# Patient Record
Sex: Male | Born: 1940 | Race: Black or African American | Hispanic: No | Smoking: Former smoker
Health system: Southern US, Community
[De-identification: ages and names within clinical notes are randomized; demographics above are authoritative.]

## PROBLEM LIST (undated history)

## (undated) DIAGNOSIS — K635 Polyp of colon: Secondary | ICD-10-CM

## (undated) DIAGNOSIS — I493 Ventricular premature depolarization: Secondary | ICD-10-CM

## (undated) DIAGNOSIS — N183 Chronic kidney disease, stage 3 unspecified: Secondary | ICD-10-CM

## (undated) DIAGNOSIS — I428 Other cardiomyopathies: Secondary | ICD-10-CM

## (undated) DIAGNOSIS — Z9289 Personal history of other medical treatment: Secondary | ICD-10-CM

## (undated) DIAGNOSIS — I498 Other specified cardiac arrhythmias: Secondary | ICD-10-CM

## (undated) DIAGNOSIS — H548 Legal blindness, as defined in USA: Secondary | ICD-10-CM

## (undated) DIAGNOSIS — I499 Cardiac arrhythmia, unspecified: Secondary | ICD-10-CM

## (undated) DIAGNOSIS — I2510 Atherosclerotic heart disease of native coronary artery without angina pectoris: Secondary | ICD-10-CM

## (undated) DIAGNOSIS — J189 Pneumonia, unspecified organism: Secondary | ICD-10-CM

## (undated) DIAGNOSIS — Z923 Personal history of irradiation: Secondary | ICD-10-CM

## (undated) DIAGNOSIS — I739 Peripheral vascular disease, unspecified: Secondary | ICD-10-CM

## (undated) DIAGNOSIS — M4310 Spondylolisthesis, site unspecified: Secondary | ICD-10-CM

## (undated) DIAGNOSIS — Z87442 Personal history of urinary calculi: Secondary | ICD-10-CM

## (undated) DIAGNOSIS — I10 Essential (primary) hypertension: Secondary | ICD-10-CM

## (undated) DIAGNOSIS — Z95810 Presence of automatic (implantable) cardiac defibrillator: Secondary | ICD-10-CM

## (undated) DIAGNOSIS — I472 Ventricular tachycardia: Principal | ICD-10-CM

## (undated) DIAGNOSIS — I219 Acute myocardial infarction, unspecified: Secondary | ICD-10-CM

## (undated) DIAGNOSIS — I441 Atrioventricular block, second degree: Secondary | ICD-10-CM

## (undated) DIAGNOSIS — I5022 Chronic systolic (congestive) heart failure: Secondary | ICD-10-CM

## (undated) HISTORY — DX: Ventricular tachycardia: I47.2

## (undated) HISTORY — DX: Spondylolisthesis, site unspecified: M43.10

## (undated) HISTORY — DX: Essential (primary) hypertension: I10

## (undated) HISTORY — DX: Polyp of colon: K63.5

## (undated) HISTORY — PX: EYE SURGERY: SHX253

## (undated) HISTORY — DX: Peripheral vascular disease, unspecified: I73.9

## (undated) HISTORY — PX: CYSTOSCOPY: SUR368

## (undated) HISTORY — PX: BACK SURGERY: SHX140

## (undated) HISTORY — DX: Personal history of other medical treatment: Z92.89

---

## 1977-11-22 HISTORY — PX: POSTERIOR FUSION LUMBAR SPINE: SUR632

## 1989-07-23 HISTORY — PX: CATARACT EXTRACTION, BILATERAL: SHX1313

## 1999-05-01 ENCOUNTER — Encounter: Payer: Self-pay | Admitting: Emergency Medicine

## 1999-05-01 ENCOUNTER — Emergency Department (HOSPITAL_COMMUNITY): Admission: EM | Admit: 1999-05-01 | Discharge: 1999-05-01 | Payer: Self-pay | Admitting: Emergency Medicine

## 1999-07-24 HISTORY — PX: ROTATOR CUFF REPAIR: SHX139

## 1999-12-18 ENCOUNTER — Encounter: Payer: Self-pay | Admitting: Family Medicine

## 1999-12-18 ENCOUNTER — Encounter: Admission: RE | Admit: 1999-12-18 | Discharge: 1999-12-18 | Payer: Self-pay | Admitting: Family Medicine

## 1999-12-29 ENCOUNTER — Encounter: Admission: RE | Admit: 1999-12-29 | Discharge: 1999-12-29 | Payer: Self-pay | Admitting: Family Medicine

## 1999-12-29 ENCOUNTER — Encounter: Payer: Self-pay | Admitting: Family Medicine

## 2000-01-31 ENCOUNTER — Emergency Department (HOSPITAL_COMMUNITY): Admission: EM | Admit: 2000-01-31 | Discharge: 2000-01-31 | Payer: Self-pay | Admitting: Emergency Medicine

## 2000-04-04 ENCOUNTER — Encounter: Payer: Self-pay | Admitting: Family Medicine

## 2000-04-04 ENCOUNTER — Encounter: Admission: RE | Admit: 2000-04-04 | Discharge: 2000-04-04 | Payer: Self-pay | Admitting: Family Medicine

## 2000-09-22 ENCOUNTER — Ambulatory Visit (HOSPITAL_COMMUNITY): Admission: RE | Admit: 2000-09-22 | Discharge: 2000-09-22 | Payer: Self-pay | Admitting: Urology

## 2001-01-07 ENCOUNTER — Emergency Department (HOSPITAL_COMMUNITY): Admission: EM | Admit: 2001-01-07 | Discharge: 2001-01-07 | Payer: Self-pay | Admitting: Emergency Medicine

## 2001-01-07 ENCOUNTER — Emergency Department (HOSPITAL_COMMUNITY): Admission: EM | Admit: 2001-01-07 | Discharge: 2001-01-07 | Payer: Self-pay

## 2001-04-05 ENCOUNTER — Encounter: Admission: RE | Admit: 2001-04-05 | Discharge: 2001-04-05 | Payer: Self-pay | Admitting: Family Medicine

## 2001-04-05 ENCOUNTER — Encounter: Payer: Self-pay | Admitting: Family Medicine

## 2001-05-27 ENCOUNTER — Emergency Department (HOSPITAL_COMMUNITY): Admission: EM | Admit: 2001-05-27 | Discharge: 2001-05-27 | Payer: Self-pay | Admitting: Emergency Medicine

## 2001-05-27 ENCOUNTER — Encounter: Payer: Self-pay | Admitting: Emergency Medicine

## 2001-06-07 ENCOUNTER — Emergency Department (HOSPITAL_COMMUNITY): Admission: EM | Admit: 2001-06-07 | Discharge: 2001-06-07 | Payer: Self-pay | Admitting: Emergency Medicine

## 2001-06-30 ENCOUNTER — Encounter: Admission: RE | Admit: 2001-06-30 | Discharge: 2001-07-28 | Payer: Self-pay | Admitting: Family Medicine

## 2002-12-12 ENCOUNTER — Encounter: Payer: Self-pay | Admitting: Family Medicine

## 2002-12-12 ENCOUNTER — Encounter: Admission: RE | Admit: 2002-12-12 | Discharge: 2002-12-12 | Payer: Self-pay | Admitting: Family Medicine

## 2003-12-12 ENCOUNTER — Encounter: Admission: RE | Admit: 2003-12-12 | Discharge: 2003-12-12 | Payer: Self-pay | Admitting: Family Medicine

## 2003-12-16 ENCOUNTER — Emergency Department (HOSPITAL_COMMUNITY): Admission: AD | Admit: 2003-12-16 | Discharge: 2003-12-16 | Payer: Self-pay | Admitting: Family Medicine

## 2004-12-20 ENCOUNTER — Emergency Department (HOSPITAL_COMMUNITY): Admission: EM | Admit: 2004-12-20 | Discharge: 2004-12-20 | Payer: Self-pay | Admitting: Emergency Medicine

## 2005-06-02 ENCOUNTER — Ambulatory Visit: Payer: Self-pay | Admitting: Gastroenterology

## 2005-06-17 ENCOUNTER — Encounter (INDEPENDENT_AMBULATORY_CARE_PROVIDER_SITE_OTHER): Payer: Self-pay | Admitting: Specialist

## 2005-06-17 ENCOUNTER — Ambulatory Visit: Payer: Self-pay | Admitting: Gastroenterology

## 2005-09-30 ENCOUNTER — Encounter: Admission: RE | Admit: 2005-09-30 | Discharge: 2005-09-30 | Payer: Self-pay | Admitting: Family Medicine

## 2005-12-20 ENCOUNTER — Encounter: Admission: RE | Admit: 2005-12-20 | Discharge: 2005-12-20 | Payer: Self-pay | Admitting: Neurological Surgery

## 2005-12-31 ENCOUNTER — Inpatient Hospital Stay (HOSPITAL_COMMUNITY): Admission: RE | Admit: 2005-12-31 | Discharge: 2006-01-05 | Payer: Self-pay | Admitting: Neurological Surgery

## 2006-02-01 ENCOUNTER — Encounter: Admission: RE | Admit: 2006-02-01 | Discharge: 2006-02-01 | Payer: Self-pay | Admitting: Neurological Surgery

## 2006-04-26 ENCOUNTER — Encounter: Admission: RE | Admit: 2006-04-26 | Discharge: 2006-04-26 | Payer: Self-pay | Admitting: Neurological Surgery

## 2006-07-27 ENCOUNTER — Encounter: Admission: RE | Admit: 2006-07-27 | Discharge: 2006-07-27 | Payer: Self-pay | Admitting: Neurological Surgery

## 2006-12-05 ENCOUNTER — Encounter: Admission: RE | Admit: 2006-12-05 | Discharge: 2006-12-05 | Payer: Self-pay | Admitting: Neurological Surgery

## 2007-04-21 ENCOUNTER — Ambulatory Visit (HOSPITAL_COMMUNITY): Admission: RE | Admit: 2007-04-21 | Discharge: 2007-04-21 | Payer: Self-pay | Admitting: Ophthalmology

## 2007-06-02 ENCOUNTER — Ambulatory Visit (HOSPITAL_COMMUNITY): Admission: RE | Admit: 2007-06-02 | Discharge: 2007-06-02 | Payer: Self-pay | Admitting: Family Medicine

## 2007-12-01 ENCOUNTER — Ambulatory Visit (HOSPITAL_COMMUNITY): Admission: RE | Admit: 2007-12-01 | Discharge: 2007-12-01 | Payer: Self-pay | Admitting: Family Medicine

## 2008-07-22 ENCOUNTER — Encounter: Admission: RE | Admit: 2008-07-22 | Discharge: 2008-07-22 | Payer: Self-pay | Admitting: Neurological Surgery

## 2008-07-24 ENCOUNTER — Encounter: Admission: RE | Admit: 2008-07-24 | Discharge: 2008-07-24 | Payer: Self-pay | Admitting: Neurological Surgery

## 2009-01-31 ENCOUNTER — Encounter: Admission: RE | Admit: 2009-01-31 | Discharge: 2009-01-31 | Payer: Self-pay | Admitting: Family Medicine

## 2009-02-18 ENCOUNTER — Encounter: Admission: RE | Admit: 2009-02-18 | Discharge: 2009-02-18 | Payer: Self-pay | Admitting: Family Medicine

## 2009-10-31 ENCOUNTER — Encounter: Admission: RE | Admit: 2009-10-31 | Discharge: 2009-10-31 | Payer: Self-pay | Admitting: Family Medicine

## 2009-11-13 ENCOUNTER — Emergency Department (HOSPITAL_COMMUNITY): Admission: EM | Admit: 2009-11-13 | Discharge: 2009-11-13 | Payer: Self-pay | Admitting: Emergency Medicine

## 2010-06-24 ENCOUNTER — Encounter (INDEPENDENT_AMBULATORY_CARE_PROVIDER_SITE_OTHER): Payer: Self-pay | Admitting: *Deleted

## 2010-06-26 ENCOUNTER — Encounter (INDEPENDENT_AMBULATORY_CARE_PROVIDER_SITE_OTHER): Payer: Self-pay | Admitting: *Deleted

## 2010-08-04 ENCOUNTER — Ambulatory Visit: Payer: Self-pay | Admitting: Gastroenterology

## 2010-08-04 ENCOUNTER — Encounter (INDEPENDENT_AMBULATORY_CARE_PROVIDER_SITE_OTHER): Payer: Self-pay | Admitting: *Deleted

## 2010-08-19 ENCOUNTER — Ambulatory Visit: Payer: Self-pay | Admitting: Gastroenterology

## 2010-08-25 ENCOUNTER — Encounter: Payer: Self-pay | Admitting: Gastroenterology

## 2010-11-22 ENCOUNTER — Emergency Department (HOSPITAL_COMMUNITY)
Admission: EM | Admit: 2010-11-22 | Discharge: 2010-11-22 | Payer: Self-pay | Source: Home / Self Care | Admitting: Emergency Medicine

## 2010-12-02 ENCOUNTER — Encounter
Admission: RE | Admit: 2010-12-02 | Discharge: 2010-12-02 | Payer: Self-pay | Source: Home / Self Care | Attending: Neurological Surgery | Admitting: Neurological Surgery

## 2010-12-22 NOTE — Letter (Signed)
Summary: Colonoscopy Letter  [facility]  [address] [place], [place] [address]   Phone: [phone]  Fax: [phone]      June 24, 2010 MRN: [id number]   [facility] [address] [place], [place]  [address]   Dear Mr. PAULDING,   According to your medical record, it is time for you to schedule a Colonoscopy. The American Cancer Society recommends this procedure as a method to detect early colon cancer. Patients with a family history of colon cancer, or a personal history of colon polyps or inflammatory bowel disease are at increased risk.  This letter has beeen generated based on the recommendations made at the time of your procedure. If you feel that in your particular situation this may no longer apply, please contact our office.  Please call our office at [phone] to schedule this appointment or to update your records at your earliest convenience.  Thank you for cooperating with [place] to provide you with the very best care possible.   Sincerely,   Barbette Hair. [facility] [phone]

## 2010-12-22 NOTE — Miscellaneous (Signed)
Summary: [facility] Previsit/prep  Clinical Lists Changes  Medications: Added new medication of DULCOLAX 5 MG  TBEC (BISACODYL) Day before procedure take 2 at 3pm and 2 at 8pm. - Signed Added new medication of METOCLOPRAMIDE HCL 10 MG  TABS (METOCLOPRAMIDE HCL) As per prep instructions. - Signed Added new medication of MIRALAX   POWD (POLYETHYLENE GLYCOL 3350) As per prep  instructions. - Signed Rx of DULCOLAX 5 MG  TBEC (BISACODYL) Day before procedure take 2 at 3pm and 2 at 8pm.;  #4 x 0;  Signed;  Entered by: Wyona Almas RN;  Authorized by: Louis Meckel MD;  Method used: [facility] [address] [phone]*, [address], [place], [place], [place]  [id number], Ph: [phone] or [phone], Fax: [phone] Rx of METOCLOPRAMIDE HCL 10 MG  TABS (METOCLOPRAMIDE HCL) As per prep instructions.;  #2 x 0;  Signed;  Entered by: Wyona Almas RN;  Authorized by: Louis Meckel MD;  Method used: Electronically to [facility] [address] [id number]*, [address] [place], [place], [place]  [id number], Ph: [phone] or [phone], Fax: [phone] Rx of MIRALAX   POWD (POLYETHYLENE GLYCOL 3350) As per prep  instructions.;  #255gm x 0;  Signed;  Entered by: Wyona Almas RN;  Authorized by: Louis Meckel MD;  Method used: Electronically to [facility] [address] [phone]*, [address] [place], [place], [place]  [address], Ph: [phone] or [phone], Fax: [phone] Observations: Added new observation of NKA: T (08/04/2010 9:45)    Prescriptions: MIRALAX   POWD (POLYETHYLENE GLYCOL 3350) As per prep  instructions.  #255gm x 0   Entered by:   Wyona Almas RN   Authorized by:   Louis Meckel MD   Signed by:   Wyona Almas RN on 08/04/2010   Method used:   [facility]ronically to        [facility]  [address] [phone]* (retail)       [address]       [place], [place]  [address]       Ph: [phone] or  [phone]       Fax: [phone]   RxID:   [id number] METOCLOPRAMIDE HCL 10 MG  TABS (METOCLOPRAMIDE HCL) As per prep instructions.  #2 x 0   Entered by:   Wyona Almas RN   Authorized by:   Louis Meckel MD   Signed by:   Wyona Almas RN on 08/04/2010   Method used:   [facility]ronically to        [facility]  [address] [phone]* (retail)       [address]       [place], [place]  [address]       Ph: [phone] or [phone]       Fax: [phone]   RxID:   [id number] DULCOLAX 5 MG  TBEC (BISACODYL) Day before procedure take 2 at 3pm and 2 at 8pm.  #4 x 0   Entered by:   Wyona Almas RN   Authorized by:   Louis Meckel MD   Signed by:   Wyona Almas RN on 08/04/2010   Method used:   Electronically to        [facility]  [address] [phone]* (retail)       [address]       [place]  [place], [place]  [address]       Ph: [phone] or [phone]       Fax: [phone]   RxID:   [id number]

## 2010-12-22 NOTE — Letter (Signed)
Summary: Patient Notice-Hyperplastic Polyps  [facility]  [address] [place], [place] [address]   Phone: [phone]  Fax: [phone]        August 25, 2010 MRN: [id number]    [facility] [address] [place], [place]  [address]    Dear [name],  I am pleased to inform you that the colon polyp(s) removed during your recent colonoscopy was (were) found to be hyperplastic.  These types of polyps are NOT pre-cancerous.  It is therefore my recommendation that you have a repeat colonoscopy examination in _7 years for routine colorectal cancer screening.  Should you develop new or worsening symptoms of abdominal pain, bowel habit changes or bleeding from the rectum or bowels, please schedule an evaluation with either your primary care physician or with me.  Additional information/recommendations:  __No further action with gastroenterology is needed at this time.      Please follow-up with your primary care physician for your other      healthcare needs. __Please call [phone] to schedule a return visit to review      your situation.  __Please keep your follow-up visit as already scheduled.  _x_Continue treatment plan as outlined the day of your exam.  Please call us if you are having persistent problems or have questions about your condition that have not been fully answered at this time.  Sincerely,  Louis Meckel MD This letter has been electronically signed by your physician.  Appended Document: Patient Notice-Hyperplastic Polyps letter mailed

## 2010-12-22 NOTE — Letter (Signed)
Summary: [facility] Instructions  [facility]  [address] [place], [place] [address]   Phone: [phone]  Fax: [phone]       MARQUES ERICSON    23-Mar-1941    MRN: [id number]       Procedure Day Dorna Bloom:  [facility]  08/19/10     Arrival Time: 9:30AM     Procedure Time:  10:30AM     Location of Procedure:                    Juliann Pares  [facility] (4th Floor)  PREPARATION FOR COLONOSCOPY WITH MIRALAX  Starting 5 days prior to your procedure 08/14/10 do not eat nuts, seeds, popcorn, corn, beans, peas,  salads, or any raw vegetables.  Do not take any fiber supplements (e.g. Metamucil, Citrucel, and Benefiber). ____________________________________________________________________________________________________   THE DAY BEFORE YOUR PROCEDURE         DATE: 08/18/10  DAY: TUESDAY  1   Drink clear liquids the entire day-NO SOLID FOOD  2   Do not drink anything colored red or purple.  Avoid juices with pulp.  No orange juice.  3   Drink at least 64 oz. (8 glasses) of fluid/clear liquids during the day to prevent dehydration and help the prep work efficiently.  CLEAR LIQUIDS INCLUDE: Water Jello Ice Popsicles Tea (sugar ok, no milk/cream) Powdered fruit flavored drinks Coffee (sugar ok, no milk/cream) Gatorade Juice: apple, white grape, white cranberry  Lemonade Clear bullion, consomm, broth Carbonated beverages (any kind) Strained chicken noodle soup Hard Candy  4   Mix the entire bottle of Miralax with 64 oz. of Gatorade/Powerade in the morning and put in the refrigerator to chill.  5   At 3:00 pm take 2 Dulcolax/Bisacodyl tablets.  6   At 4:30 pm take one Reglan/Metoclopramide tablet.  7  Starting at 5:00 pm drink one 8 oz glass of the Miralax mixture every 15-20 minutes until you have finished drinking the entire 64 oz.  You should finish drinking prep around 7:30 or 8:00 pm.  8   If you are nauseated, you may take the 2nd Reglan/Metoclopramide  tablet at 6:30 pm.        9    At 8:00 pm take 2 more DULCOLAX/Bisacodyl tablets.     THE DAY OF YOUR PROCEDURE      DATE:  08/19/10  DAY: Lulu Riding  You may drink clear liquids until 8:30AM  (2 HOURS BEFORE PROCEDURE).   MEDICATION INSTRUCTIONS  Unless otherwise instructed, you should take regular prescription medications with a small sip of water as early as possible the morning of your procedure.    Additional medication instructions: Hold Furosemide the morning of procedure.  Take your blood pressure medicine the morning of procedure.         OTHER INSTRUCTIONS  You will need a responsible adult at least 72 years of age to accompany you and drive you home.   This person must remain in the waiting room during your procedure.  Wear loose fitting clothing that is easily removed.  Leave jewelry and other valuables at home.  However, you may wish to bring a book to read or an iPod/MP3 player to listen to music as you wait for your procedure to start.  Remove all body piercing jewelry and leave at home.  Total time from sign-in until discharge is approximately 2-3 hours.  You should go home directly after your procedure and rest.  You can resume  normal activities the day after your procedure.  The day of your procedure you should not:   Drive   Make legal decisions   Operate machinery   Drink alcohol   Return to work  You will receive specific instructions about eating, activities and medications before you leave.   The above instructions have been reviewed and explained to me by   Wyona Almas RN  August 04, 2010 10:16 AM     I fully understand and can verbalize these instructions _____________________________ Date _______

## 2010-12-22 NOTE — Letter (Signed)
Summary: Previsit letter  [facility]  [address] [place], [place] [address]   Phone: [phone]  Fax: [phone]       06/26/2010 MRN: [id number]  [facility] [address] [place], [place]  [address]  Dear [name],  Welcome to the Gastroenterology Division at [facility].    You are scheduled to see a nurse for your pre-procedure visit on 08/04/2010 at 10:00AM on the 3rd floor at [facility], [address]  We ask that you try to arrive at our office 15 minutes prior to your appointment time to allow for check-in.  Your nurse visit will consist of discussing your medical and surgical history, your immediate family medical history, and your medications.    Please bring a complete list of all your medications or, if you prefer, bring the medication bottles and we will list them.  We will need to be aware of both prescribed and over the counter drugs.  We will need to know exact dosage information as well.  If you are on blood thinners (Coumadin, Plavix, Aggrenox, Ticlid, etc.) please call our office today/prior to your appointment, as we need to consult with your physician about holding your medication.   Please be prepared to read and sign documents such as consent forms, a financial agreement, and acknowledgement forms.  If necessary, and with your consent, a friend or relative is welcome to sit-in on the nurse visit with you.  Please bring your insurance card so that we may make a copy of it.  If your insurance requires a referral to see a specialist, please bring your referral form from your primary care physician.  No co-pay is required for this nurse visit.     If you cannot keep your appointment, please call [phone] to cancel or reschedule prior to your appointment date.  This allows [place] the opportunity to schedule an appointment for another patient in need of care.    Thank you for choosing  Gastroenterology for your medical  needs.  We appreciate the opportunity to care for you.  Please visit [place] at our website  to learn more about our practice.                     Sincerely.                                                                                                                   The Gastroenterology Division

## 2010-12-22 NOTE — Procedures (Signed)
Summary: Colonoscopy  Patient: [name] Note: All result statuses are Final unless otherwise noted.  Tests: (1) Colonoscopy (COL)   COL Colonoscopy           DONE     [facility]     [address]     [place], [place]  [address]           COLONOSCOPY PROCEDURE REPORT           PATIENT:  [name], [name]  MR#:  [id number]     BIRTHDATE:  01-01-1941, 69 yrs. old  GENDER:  male           ENDOSCOPIST:  Vandy Tsuchiya. Arlyce Dice, MD     Referred by:           PROCEDURE DATE:  08/19/2010     PROCEDURE:  Colonoscopy with snare polypectomy     ASA CLASS:  Class II     INDICATIONS:  1) screening  2) history of pre-cancerous     (adenomatous) colon polyps           MEDICATIONS:   Fentanyl 50 mcg IV, Versed 5 mg IV           DESCRIPTION OF PROCEDURE:   After the risks benefits and     alternatives of the procedure were thoroughly explained, informed     consent was obtained.  Digital rectal exam was performed and     revealed no abnormalities.   The LB CF-[id number] endoscope     was introduced through the anus and advanced to the cecum, which     was identified by the ileocecal valve, without limitations.  The     quality of the prep was excellent, using MiraLax.  The instrument     was then slowly withdrawn as the colon was fully examined.     <<PROCEDUREIMAGES>>           FINDINGS:  A diminutive polyp was found in the ascending colon. It     was 2 mm in size. Polyp was snared without cautery. Retrieval was     successful (see image3). snare polyp  There were multiple polyps     identified and removed. in the recto-sigmoid colon. Multiple 1-80mm     sessile hyperplastic appearing polyps (normal overlying mucosa) in     rectum and sigmoid to 20cm (see image15 and image12). 2 polyps     were removed with cold polypectomy snare and submitted to     pathology  This was otherwise a normal examination of the colon     (see image1, image2, image6, image9, image11, and image16).     Retroflexed views in the rectum revealed no abnormalities.    The     time to cecum =  5.75  minutes. The scope was then withdrawn (time     =  12.50  min) from the patient and the procedure completed.           COMPLICATIONS:  None           ENDOSCOPIC IMPRESSION:     1) 2 mm diminutive polyp in the ascending colon     2) Polyps, multiple in the recto-sigmoid colon     3) Otherwise normal examination     RECOMMENDATIONS:     1) Await biopsy results; if polyps in rectosigmoid are     adenomatous, will need F. sig and polypectomy  REPEAT EXAM:   You will receive a letter from Dr. Arlyce Dice in 1-2     weeks, after reviewing the final pathology, with followup     recommendations.           ______________________________     Barbette Hair Arlyce Dice, MD           CC: Clyda Greener, MD           n.     Rosalie Doctor:   Barbette Hair. Kaplan at 08/19/2010 11:27 AM           Luciana Axe, [id number]  Note: An exclamation mark (!) indicates a result that was not dispersed into the flowsheet. Document Creation Date: 08/19/2010 11:27 AM _______________________________________________________________________  (1) Order result status: Final Collection or observation date-time: 08/19/2010 11:19 Requested date-time:  Receipt date-time:  Reported date-time:  Referring Physician:   Ordering Physician: Melvia Heaps [phone]) Specimen Source:  Source: Launa Grill Order Number: [id number] Lab site:   Appended Document: Colonoscopy     Procedures Next Due Date:    Colonoscopy: 07/2017

## 2011-01-11 ENCOUNTER — Encounter (HOSPITAL_COMMUNITY)
Admission: RE | Admit: 2011-01-11 | Discharge: 2011-01-11 | Disposition: A | Discharge status: Home / Self Care | Payer: Medicare Other | Source: Ambulatory Visit | Attending: Neurological Surgery | Admitting: Neurological Surgery

## 2011-01-11 ENCOUNTER — Other Ambulatory Visit (HOSPITAL_COMMUNITY): Payer: Self-pay | Admitting: Neurological Surgery

## 2011-01-11 ENCOUNTER — Ambulatory Visit (HOSPITAL_COMMUNITY)
Admission: RE | Admit: 2011-01-11 | Discharge: 2011-01-11 | Disposition: A | Discharge status: Home / Self Care | Payer: Medicare Other | Source: Ambulatory Visit | Attending: Neurological Surgery | Admitting: Neurological Surgery

## 2011-01-11 DIAGNOSIS — M48061 Spinal stenosis, lumbar region without neurogenic claudication: Secondary | ICD-10-CM | POA: Insufficient documentation

## 2011-01-11 DIAGNOSIS — M4807 Spinal stenosis, lumbosacral region: Secondary | ICD-10-CM

## 2011-01-11 DIAGNOSIS — Z01818 Encounter for other preprocedural examination: Secondary | ICD-10-CM | POA: Insufficient documentation

## 2011-01-11 LAB — BASIC METABOLIC PANEL
BUN: 11 mg/dL (ref 6–23)
CO2: 30 mEq/L (ref 19–32)
Calcium: 9.6 mg/dL (ref 8.4–10.5)
Chloride: 104 mEq/L (ref 96–112)
Creatinine, Ser: 1.37 mg/dL (ref 0.4–1.5)
GFR calc Af Amer: >60 mL/min (ref >60)
GFR calc non Af Amer: 51 mL/min — ABNORMAL LOW (ref >60)
Glucose, Bld: 90 mg/dL (ref 70–99)
Potassium: 3.6 mEq/L (ref 3.5–5.1)
Sodium: 142 mEq/L (ref 135–145)

## 2011-01-11 LAB — PROTIME-INR
INR: 1.17 (ref 0.00–1.49)
Prothrombin Time: 15.1 seconds (ref 11.6–15.2)

## 2011-01-11 LAB — TYPE AND SCREEN
ABO/RH(D): B POS
Antibody Screen: NEGATIVE

## 2011-01-11 LAB — DIFFERENTIAL
Basophils Absolute: 0 10*3/uL (ref 0.0–0.1)
Basophils Relative: 1 % (ref 0–1)
Eosinophils Absolute: 0.1 10*3/uL (ref 0.0–0.7)
Eosinophils Relative: 3 % (ref 0–5)
Lymphocytes Relative: 50 % — ABNORMAL HIGH (ref 12–46)
Lymphs Abs: 2.6 10*3/uL (ref 0.7–4.0)
Monocytes Absolute: 0.4 10*3/uL (ref 0.1–1.0)
Monocytes Relative: 7 % (ref 3–12)
Neutro Abs: 2.1 10*3/uL (ref 1.7–7.7)
Neutrophils Relative %: 40 % — ABNORMAL LOW (ref 43–77)

## 2011-01-11 LAB — CBC
HCT: 42.7 % (ref 39.0–52.0)
Hemoglobin: 15.1 g/dL (ref 13.0–17.0)
MCH: 30.4 pg (ref 26.0–34.0)
MCHC: 35.4 g/dL (ref 30.0–36.0)
MCV: 85.9 fL (ref 78.0–100.0)
Platelets: 161 10*3/uL (ref 150–400)
RBC: 4.97 MIL/uL (ref 4.22–5.81)
RDW: 12.5 % (ref 11.5–15.5)
WBC: 5.3 10*3/uL (ref 4.0–10.5)

## 2011-01-11 LAB — SURGICAL PCR SCREEN
MRSA, PCR: NEGATIVE
Staphylococcus aureus: NEGATIVE

## 2011-01-15 ENCOUNTER — Inpatient Hospital Stay (HOSPITAL_COMMUNITY): Payer: Medicare Other

## 2011-01-15 ENCOUNTER — Inpatient Hospital Stay (HOSPITAL_COMMUNITY)
Admission: RE | Admit: 2011-01-15 | Discharge: 2011-01-17 | DRG: 460 | Disposition: A | Discharge status: Home / Self Care | Payer: Medicare Other | Source: Ambulatory Visit | Attending: Neurological Surgery | Admitting: Neurological Surgery

## 2011-01-15 DIAGNOSIS — Z981 Arthrodesis status: Secondary | ICD-10-CM

## 2011-01-15 DIAGNOSIS — M48061 Spinal stenosis, lumbar region without neurogenic claudication: Principal | ICD-10-CM | POA: Diagnosis present

## 2011-01-15 DIAGNOSIS — I10 Essential (primary) hypertension: Secondary | ICD-10-CM | POA: Diagnosis present

## 2011-01-15 DIAGNOSIS — Z472 Encounter for removal of internal fixation device: Secondary | ICD-10-CM

## 2011-01-15 DIAGNOSIS — N35919 Unspecified urethral stricture, male, unspecified site: Secondary | ICD-10-CM | POA: Diagnosis present

## 2011-01-15 DIAGNOSIS — Z87891 Personal history of nicotine dependence: Secondary | ICD-10-CM

## 2011-01-16 NOTE — Op Note (Signed)
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  PROCEDURES: 1. Lumbar reexploration with removal of hardware at L5-S1 at a level     separate than our PLIF procedure. 2. Decompressive lumbar laminectomy, hemi-facetectomy, and     foraminotomy at L3-4 for decompression of the L3 and the L4 nerve     roots requiring much more work than it is typical of the simple     PLIF procedure. 3. Intertransverse arthrodesis at L3-4 utilizing local autograft and     Vitoss soaked with bone marrow aspirate. 4. Nonsegmental fixation at L3-4 utilizing the radius pedicle screw     system. 5. Posterior lumbar interbody fusion at L3-4 utilizing 12-mm PEEK     interbody cages packed with local autograft and Vitoss soaked with     bone marrow aspirate. 6. Bone marrow aspiration.  SURGEON:  Tia Alert, MD  ASSISTANT:  Donalee Citrin, MD  ANESTHESIA:  General endotracheal.  COMPLICATIONS:  None apparent.  INDICATIONS FOR PROCEDURE:  Mr. Kimmey is a 70 year old gentleman who underwent a previous L4-S1 fusion.  He presented with severe back and bilateral leg pain, had an MRI which showed adjacent level stenosis at L3-4.  He had plain films which suggested a solid fusion at L4-5 and L5- S1.  I recommended a L3-4 posterior lumbar interbody fusion with removal of hardware at L5-S1.  He understood the risks, benefits, and expected outcome and wished  to proceed.  DESCRIPTION OF PROCEDURE:  The patient was taken to the operating room. After induction of adequate generalized endotracheal anesthesia, he was rolled in prone position on chest rolls and all pressure points were padded.  His lumbar region was prepped with DuraPrep and draped in the usual sterile fashion.  10 mL of local anesthesia was injected and a dorsal midline incision was made and carried down to the lumbosacral fascia.  The fascia was opened and the paraspinous musculature was taken down in a subperiosteal fashion to expose L2-3, L3-4, and then the hardware from L4-S1.  Intraoperative x-ray confirmed my level and then I removed the spinous process of L3 and then performed a complete laminectomy and hemi-facetectomy at L3-4.  I was able to dissect the scar tissue away from the underlying dura.  The bone had grown back across the canal below the L4 pedicle level.  I was able to gently remove this.  There was no evidence of unintended durotomy.  I marched out to the pedicle wall, identified the L4 nerve roots, identified the L3 nerve roots.  I performed a  generous foraminotomy after my hemi- facetectomy.  All the yellow ligament was removed until the dura was nice and full and I could pass a coronary dilator easily along the L3 and the L4 nerve roots.  We then retracted the dura medially and coagulated the epidural venous vasculature and cut it sharply and cut the disk space.  We distracted the disk space at the 12 mm and then used rotating cutters and curettes to prepare the endplates.  We used 12-mm PEEK interbody cages on both sides.  These were packed with local autograft and Vitoss.  We then packed the midline with Vitoss and local autograft.  We then found our pedicle screw entry zones at L3 utilizing surface landmarks and lateral fluoroscopy.  We probed each pedicle with the pedicle probe, tapped each pedicle with a 5.5 tap.  I used the ball probe to palpate each  pedicle to assure no break in the cortex and then placed 6.75 x 45 mm pedicle screws into the L3 pedicles bilaterally.  We then decorticated the transverse processes and placed a mixture of local autograft and Vitoss out over these after obtaining a bone marrow aspirate from the pedicles escorting this on our Vitoss.  We then placed lordotic rods into multiaxial screw heads, placed our locking caps, and locked these with anti-torque device while achieving compression of our grafts.  We then checked our final construct with AP and lateral fluoroscopy.  We irrigated it with saline solution containing bacitracin, dried all the bleeding points, lined the dura with Gelfoam, placed a medium Hemovac drain, and then closed the muscle and fascia with 0 Vicryl, closed the subcutaneous and subcuticular tissue with 2-0 and 3-0 Vicryl, and closed the skin with benzoin and Steri-Strips.  The drapes were removed.  Sterile dressing was applied.  The patient was awakened from general anesthesia and transferred to the recovery room in stable condition.  At the end of the procedure, all sponge, needle, and instrument counts were correct.     Tia Alert, MD  DSJ/MEDQ  D:  01/15/2011  T:  01/16/2011  Job:  [id number]  Electronically Signed by Marikay Alar MD on 01/16/2011 08:11:41 AM

## 2011-01-18 NOTE — Op Note (Signed)
  NAME:  [name], [name]              ACCOUNT NO.:  [id number]  MEDICAL RECORD NO.:  [id number]           PATIENT TYPE:  I  LOCATION:  3172                         FACILITY:  [facility]  PHYSICIAN:  Lylla Eifler I. Patsi Sears, M.D.DATE OF BIRTH:  [birth year]  DATE OF PROCEDURE: DATE OF DISCHARGE:                              OPERATIVE REPORT   PREOPERATIVE DIAGNOSIS:  Urethral stricture disease.  POSTOPERATIVE DIAGNOSIS:  Urethral stricture disease.  OPERATIONS:  Flexible cystoscopy, dilation of penoscrotal junction stricture (26-French), and placement of 16-French Council catheter.  SURGEON:  Zerek Litsey I. Patsi Sears, M.D.  ANESTHESIA:  General endotracheal.  INDICATION FOR PROCEDURE:  With the patient in supine position, urology was called because of inability to pass Foley catheter on multiple occasions.  The patient has a past history of difficult Foley catheterization from neurosurgery several years ago.  PROCEDURE IN DETAIL:  Flexible bedside cystoscopy was accomplished, and penoscrotal junction stricture identified.  Guidewire was passed through the strictured area into the bladder and then the [facility] dilators were used to dilate the stricture to size 26.  This was removed, and a 16- Council catheter was then passed over the guidewire into the bladder. 10 mL of saline re-placed into the bladder, and clear urine was obtained.  The patient tolerated the procedure well.  The patient will keep the Foley catheter until he is able to stand to void.     Verdean Murin I. Patsi Sears, M.D.     SIT/MEDQ  D:  01/15/2011  T:  01/15/2011  Job:  [id number]  cc:   Tia Alert, MD Fax: [phone]  Electronically Signed by Jethro Bolus M.D. on 01/18/2011 09:35:10 AM

## 2011-01-29 NOTE — Discharge Summary (Signed)
  [name], [name]              ACCOUNT NO.:  [id number]  MEDICAL RECORD NO.:  [id number]           PATIENT TYPE:  I  LOCATION:  3006                         FACILITY:  [facility]  PHYSICIAN:  Tia Alert, MD     DATE OF BIRTH:  12/22/1940  DATE OF ADMISSION:  01/15/2011 DATE OF DISCHARGE:  01/17/2011                              DISCHARGE SUMMARY   ADMITTING DIAGNOSIS:  Adjacent level stenosis, L3-4.  PROCEDURE:  Posterior lumbar interbody fusion, L3-4.  HISTORY OF PRESENT ILLNESS:  [name] is a 70 year old gentleman who underwent a previous L4-S1 fusion.  He presents with severe back pain with bilateral leg pain and had an MRI which showed adjacent level stenosis at L3-4.  He had plain films that suggested a solid fusion at L4-5 and L5-S1.  I recommended posterior lumbar interbody fusion to L3-4 with removal of hardware at L5-S1.  He understood the risks, benefits, and expected outcome and wished to proceed.  HOSPITAL COURSE:  The patient was admitted on January 15, 2011, and taken to the operating room where he underwent a posterior lumbar interbody fusion at L3-4.  The patient tolerated the procedure well, was taken to the recovery room and then to the floor in stable condition. For details of the operative procedure, please see the dictated operative note.  The patient's hospital course was routine.  There were no complications.  He mobilized slowly with physical and occupational therapy.  He had good strength in his lower extremities.  He had improvement in his lower extremity pain.  He had appropriate back soreness.  His incision remained clean, dry, and intact.  He was urinating without difficulty after his Foley catheter was removed.  His pain became well controlled on oral pain medications.  He was discharged home in stable condition on January 17, 2011, with plans to follow up in 2 weeks.  FINAL DIAGNOSIS:  Posterior lumbar interbody fusion,  L3-4.     Tia Alert, MD     DSJ/MEDQ  D:  01/17/2011  T:  01/17/2011  Job:  [id number]  Electronically Signed by Marikay Alar MD on 01/29/2011 08:17:21 AM

## 2011-02-23 ENCOUNTER — Other Ambulatory Visit: Payer: Self-pay | Admitting: Neurological Surgery

## 2011-02-23 ENCOUNTER — Ambulatory Visit
Admission: RE | Admit: 2011-02-23 | Discharge: 2011-02-23 | Disposition: A | Discharge status: Home / Self Care | Payer: Medicare Other | Source: Ambulatory Visit | Attending: Neurological Surgery | Admitting: Neurological Surgery

## 2011-02-23 DIAGNOSIS — M48061 Spinal stenosis, lumbar region without neurogenic claudication: Secondary | ICD-10-CM

## 2011-02-23 DIAGNOSIS — M961 Postlaminectomy syndrome, not elsewhere classified: Secondary | ICD-10-CM

## 2011-04-09 NOTE — Op Note (Signed)
[name], [name] NO.:  [id number]   MEDICAL RECORD NO.:  [id number]          PATIENT TYPE:  INP   LOCATION:  2899                         FACILITY:  [facility]   PHYSICIAN:  Tia Alert, MD     DATE OF BIRTH:  [birth year]   DATE OF PROCEDURE:  12/31/2005  DATE OF DISCHARGE:                                 OPERATIVE REPORT   PREOPERATIVE DIAGNOSIS:  1.  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  2.  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   POSTOPERATIVE DIAGNOSIS:  1.  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  2.  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   PROCEDURE:  1.  Decompressive lumbar laminectomy, medial facetectomy and foraminotomy at      L4-5 and S1 for central canal and nerve root decompression; requiring      more work than is usually required for the Simple-plus procedure.  2.  Posterior lumbar interbody fusion -- L4-5 and  L5-S1; utilizing 9 mm      Peak interbody cages packed with local autograft and ActiFuse L4-5; and      8 mm Peak interbody cages packed with local autograft and ActiFuse at L5-      S1.  3.  Intertransverse arthrodesis, L4-S1 bilaterally -- utilizing ActiFuse and      local autograft.  4.  Segmental fixation L4-S1 bilaterally, utilizing the 90-D pedicle screw      system.   SURGEON:  Tia Alert, MD   ASSISTANT:  Kathaleen Maser. Pool, M.D..   ANESTHESIA:  General endotracheal.   COMPLICATIONS:  None apparent.   INDICATIONS FOR PROCEDURE:  [name] is a very pleasant 70 year old black  male, who was referred for back and bilateral leg pain (right greater than  left). He had an MRI and a CT scan, which showed severe degenerative disk  disease at L5-S1. He had a grade 1 spondylolisthesis at L4-5. He had spinal  stenosis at each level. He had tried medical management for quite some time  without significant relief. I recommended a decompressive lumbar laminectomy  with  segmental fusion and fixation at L4-S1. He understood the risks,  benefits and expected outcome and wished to proceed.   DESCRIPTION OF PROCEDURE:  The patient was taken to the operating room.  After induction of adequate generalized endotracheal anesthesia, he was  rolled into the prone position on the chest rolls. All pressure points were  padded. His lumbar region was prepped with DuraPrep and then draped in the  usual sterile fashion. Local anesthesia of 10 cc was injected, and then a  dorsal midline incision was made and carried down to the lumbosacral fascia.  The fascia was opened and the paraspinous musculature was taken down in a  subperiosteal fashion, to expose the L4-S1 bilaterally. Once intraoperative  fluoroscopy confirmed my level, I took the dissection out over the facets to  expose the transverse processes at L4-S1.  I then used the [place] rongeur  to remove the spinous processes and used the [employer] and the  [place] punch  to perform complete laminectomy, hemifacetectomies and foraminotomies of L4-  5 and  L5-S1. He had quite overgrown yellow ligament, which was identified  and removed.  The L4, L5 and S1 nerve roots were identified and decompressed  distally into their respective foramina.   I carried the dissection along each nerve root to assure adequate  decompression of each of the nerve roots.  I then coagulated the epidural  and venous vasculature.  I started at the L4-5 level. We incised the disk  space and performed the initial diskectomy with pituitary rongeurs.  We then  used sequential distraction to distract the disk space up to 9 mm. We then  used the rotating cutters, chisels and curets to prepare the endplates for  arthrodesis. We performed a complete diskectomy bilaterally using these  instruments.  We then, on the patient's left side, used a 9 x 25 mm Peak  interbody cage packed with local autograft and ActiFuse; tapped this into  position. We then  packed the midline with local autograft and ActiFuse.  We  then placed another interbody graft on the patient's left side that was  packed with autograft and ActiFuse.  Once the PLIFF was done at L4-5, we  used the same procedure to do at PLIFF at L5-S1. We incised the disk space  and performed initial diskectomy with pituitary rongeurs. We then completed  diskectomy after sequentially distracting the disk space up to 8 mm.  We  completed our diskectomy with curets and round scrapers.  We then used  8 mm  Peak interbody cages at L5-S1, and packed the midline with local autograft  and ActiFuse.   Once the PLIFF was complete, we turned our attention to the segmental  fixation.  We localized the pedicle screw entry zones at L4, L5 and S1  bilaterally. We probed each pedicle with a pedicle probe under fluoroscopic  guidance. We then tapped each pedicle with a 5.25 tap.  We then palpated  each pedicle to assure no break in the cortex, and placed 675 x 45 mm  pedicle screws in the pedicles of L4 and L5, and then 675 x 35 mm pedicle  screws in the S1 pedicles.  This was done under fluoroscopic guidance.   We then decorticated transverse processes of L4-L5 and S1; and then laid a  mixture of autograft and ActiFuse out over these to perform intertransverse  arthrodesis. We then placed two lordotic rods into the multiaxial screw  heads, and locked these into position with the locking caps and the anti-  torque device.  I then irrigated with copious amounts of bacitracin  containing saline solution.  I inspected the nerve roots once again with a  coronary dilator to assure adequate decompression of the nerve roots.  I  lined the dura with Gelfoam; placed a medium Hemovac drain through a  separate stab incision, and then closed the muscle and the fascia with  interrupted 0 Vicryl.  I closed the subcutaneous and subcuticular tissues with 2-0 and 3-0 Vicryl.  Closed the skin with Benzoin and  Steri-Strips. The  drapes were removed. The sterile dressing was applied. The patient was  awakened from general anesthesia and transported to the recovery room in  stable condition. At the endo of the procedure all sponge, needle and  instrument counts were correct.      Tia Alert, MD  Electronically Signed     DSJ/MEDQ  D:  12/31/2005  T:  01/01/2006  Job:  [id number]

## 2011-04-09 NOTE — Op Note (Signed)
[place]  Patient:    [name], [name]                     MRN: [id number] Proc. Date: 09/22/00 Adm. Date:  [id number] Attending:  Monica Becton                           Operative Report  PREOPERATIVE DIAGNOSIS:  Anterior urethral stricture.  POSTOPERATIVE DIAGNOSIS:  Anterior urethral stricture.  OPERATION:  Direct vision urethrotomy and cystoscopy.  SURGEON:  Claudette Laws, M.D.  DESCRIPTION OF PROCEDURE:  The patient was prepped and draped in the dorsal lithotomy under LMA anesthesia.  Urethroscopy was performed showing a concentric short stricture in the proximal anterior urethra.  Initially, I intubated the urethra with the 3 French whistle tip catheter and then using the direct vision urethrotome and the 0 degree lens a direct vision urethrotomy was performed with the half moon urethrotome.  The incision was carried through the cavernous tissue about 1 cm proximal and distal to the stricture with the 12 oclock position, and this seemed to open up the area nicely.  I then cystoscoped him with the 22 French cystoscope.  The rest of the urethra looked normal.  He had a short nonobstructing appearing prostate, some elevation of the posterior lip, normal view.  The bladder itself showed +1 trabeculation but no tumors, no calculi, normal urethral orifices, no suspicious lesions.  A #20 French 5 cc Foley catheter was then hooked to straight drain and the leg bag, and he was taken back to the recovery room in satisfactory condition. DD:  09/22/00 TD:  09/22/00 Job: [id number]

## 2011-04-09 NOTE — Consult Note (Signed)
[place]  Patient:    [name], [name]                     MRN: [id number] Proc. Date: 01/31/00 Adm. Date:  [id number] Attending:  Sandi Raveling Dictator:   [phone]                          Consultation Report  ER CONSULTATION  CHIEF COMPLAINT:  "Cant urinate."  HISTORY OF PRESENT ILLNESS:  This 70 year old man who is on disability comes in in acute urinary retention.  He saw his physician, Dr. Bruna Potter, for a physical a few days ago and he, apparently, was okay, no significant medical problems. However, over the past 24 hours, he developed progressive dribbling of urination, straining to urinate and got quite uncomfortable.  He was then seen here in the [facility].  Attempts were made to catheterize him and they were unsuccessful. I was called into see him.  PHYSICAL EXAMINATION:  GENERAL: Examination revealed a moderately acute man complaining of distended bladder.  GENITALIA:  Uncircumcised male.  Normal meatus. Testicles were normal.  RECTAL EXAM:  Deferred for now.  A flexible cystoscopy was initially performed after injecting the urethra with 20 cc of Urojet.  He was found to have about a mid urethral stricture, concentric.  I then dilated him up with filiform and followers to a #20 [place] and then submitted a cathed urine for C&S.  I removed the filiform and followers and was  able to get in an 89 [place] Foley catheter and hooked it to straight drain and o a leg bag.  He was then given gentamicin 160 mg IM to cover him for sepsis.  A prescription for Sulfatrimethoprim DS one b.i.d. #20 was written pending the urine culture.  He will call me if he has any chills or develops symptoms of sepsis. I gave him my name and phone number, I would like to see him back in the office in two to three days for follow up and probably remove the Foley catheter for a trial of voiding. DD:  01/31/00 TD:  01/31/00 Job:  [id number]

## 2011-04-09 NOTE — Discharge Summary (Signed)
NAMEDAISHAWN, LAUF NO.:  [id number]   MEDICAL RECORD NO.:  [id number]          PATIENT TYPE:  INP   LOCATION:  3014                         FACILITY:  [facility]   PHYSICIAN:  Tia Alert, MD     DATE OF BIRTH:  [birth year]   DATE OF ADMISSION:  12/31/2005  DATE OF DISCHARGE:  01/05/2006                                 DISCHARGE SUMMARY   ADMITTING DIAGNOSIS:  Spondylolisthesis at lumbar vertebrae-4/5 with severe  degenerative disease lumbar verterbrae-5/sacral vertebrae-1.   PROCEDURE:  Posterior lumbar interbody fusion L4-5, L5-S1 with segmental  instrumentation.   BRIEF HISTORY OF PRESENT ILLNESS:  Mr. Kenneth is a 70 year old male who  presented to the [facility] with complaints of severe back pain  with leg pain. He was found to have spondylolisthesis at L4-5 with severe  degenerative disease L5-S1 with stenosis at L5-S1. He had tried medical  management for some time without significant relief. I recommended a two-  level lumbar decompression and instrument effusion. He understood the risks,  benefits and expected outcome and wished to proceed.   HOSPITAL COURSE:  The patient was admitted on December 31, 2005 and taken to  operating room where he underwent a two-level posterior lumbar interbody  fusion L4-5 and L5-S1 with segmental instrumentation. The patient tolerated  the procedure well and was taken recovery room and then to the floor in  stable condition. For details of the operative procedure, please see the  dictated operative note. The patient's hospital course was routine. There  were no complications. He had a urethral stricture requiring urology to  place a Foley catheter preoperatively. Therefore, the catheter remained in  place for two to three days postoperatively. Once it was removed, he had no  difficulty with voiding. His wound remained clean, dry and intact. He  remained afebrile with stable vital signs. His diet was advanced to  a  regular which he tolerated well. He began to ambulate in the halls using a  walker with physical and occupational therapy. He made strides with this and  was safe for discharge to home on January 05, 2006.   DISCHARGE MEDICATIONS:  Percocet 1-2 p.o. q.6h. p.r.n. pain and Flexeril 10  mg p.o. t.i.d. p.r.n. spasms.   FINAL DIAGNOSIS:  Poster lumbar antibody fusion lumbar vertebrae-4/5 and  lumbar verterbrae-5/sacral vertebrae-1.      Tia Alert, MD  Electronically Signed     DSJ/MEDQ  D:  02/11/2006  T:  02/12/2006  Job:  [id number]

## 2011-04-09 NOTE — Consult Note (Signed)
[name], [name] NO.:  [id number]   MEDICAL RECORD NO.:  [id number]          PATIENT TYPE:  INP   LOCATION:  2899                         FACILITY:  [facility]   PHYSICIAN:  Excell Seltzer. Annabell Howells, M.D.    DATE OF BIRTH:  [birth year]   DATE OF CONSULTATION:  12/31/2005  DATE OF DISCHARGE:                                   CONSULTATION   Briefly, [name] is a 70 year old black male who is to undergo a  multilevel spinal fusion.  Attempt at placement of Foley catheter  preoperatively was unsuccessful.   On review of his chart, he had no prior urologic history or procedures, but  did have some irritative voiding symptoms.   PHYSICAL EXAMINATION:  GENITOURINARY:  He is a well-developed, well-  nourished black male, with an unremarkable phallus.  There was some blood at  the meatus.  Scrotum was unremarkable.  Testicles are descended.   An initial attempt at passage of a 68 [place] Coude catheter was  unsuccessful.  I met obstruction at the midurethral level.   Cystoscopy was then performed using the 16 French flexible scope.  Examination revealed a stricture at the midurethral level.   I then passed a filiform through the stricture and dilated the urethra to 15  [place] with woven followers, and then 22 [place] with a screw-on [employer]  and the filiform.   Cystoscopy was then repeated.  Examination revealed mild disruption of the  stricture, allowing passage of the scope.  The remainder of the urethra was  unremarkable.  The external sphincter was intact.  The prostatic urethra was  short, without obstruction.  The bladder had mild trabeculation, without  tumor, stones, or inflammation.  The ureteral orifices were unremarkable.   After completion of cystoscopy, the 71 French Coude catheter was then  replaced.  This time, it was passed into the bladder.  There was still some  resistance in the midurethra, but I was able to get it in with moderate  pressure.  Once in  position, the balloon was filled with 10 cc of sterile  fluid, and the catheter was placed to straight drainage, with return of  clear urine.   IMPRESSION:  Midurethral stricture disease.   PLAN:  I would maintain Foley catheter drainage until the patient is able to  stand to void.  He should follow up in our office should he have recurrent  problems.      Excell Seltzer. Annabell Howells, M.D.  Electronically Signed     JJW/MEDQ  D:  12/31/2005  T:  12/31/2005  Job:  [id number]   cc:   Tia Alert, MD  Fax: [phone]

## 2011-04-09 NOTE — Consult Note (Signed)
NAMEXZANDER, Rodney              ACCOUNT NO.:  [id number]   MEDICAL RECORD NO.:  [id number]          PATIENT TYPE:  INP   LOCATION:  3014                         FACILITY:  [facility]   PHYSICIAN:  Excell Seltzer. Annabell Howells, M.D.    DATE OF BIRTH:  13-May-1941   DATE OF CONSULTATION:  01/01/2006  DATE OF DISCHARGE:                                   CONSULTATION   Briefly, [name] is a 70 year old black male who was admitted for  multilevel spinal fusion, attempts to place Foley catheter prior to  positioning were unsuccessful. The patient had no urologic history noted in  his chart and on exam he had an unremarkable phallus and blood at the  meatus. Upon report of the physician there was some obstruction in the mid  urethral level.   PROCEDURE:  I initially attempted to place a 16-French Coude, but met  obstruction at the mid urethral level.   Cystoscopy was then performed with a 16-French flexible scope. Examination  revealed stricture with about a 12-French lumen at the mid urethral level.   A filiform was passed through the stricture without difficulty to the  bladder and the stricture was dilated to 22-French using both woven and  screw-on Sissy Hoff sounds.   Once dilated, repeat cystoscopy was performed and demonstrated dilation of  the stricture, no other urethral stricturing was noted, normal external  sphincter was present, the prostate urethra was without obstruction, the  bladder was trabeculated without tumor, stones, or inflammation. Ureteral  orifices were unremarkable.   A 16-French Coude Foley catheter was inserted. The balloon was filled with  10 mL of sterile fluid and a catheter was placed to straight drainage. There  were no complications. Dr. Yetta Barre was encouraged to leave the catheter in  until the patient was able to stand to void.      Excell Seltzer. Annabell Howells, M.D.  Electronically Signed     JJW/MEDQ  D:  01/01/2006  T:  01/02/2006  Job:  [id number]

## 2011-04-26 ENCOUNTER — Ambulatory Visit
Admission: RE | Admit: 2011-04-26 | Discharge: 2011-04-26 | Disposition: A | Discharge status: Home / Self Care | Payer: Medicare Other | Source: Ambulatory Visit | Attending: Neurological Surgery | Admitting: Neurological Surgery

## 2011-04-26 ENCOUNTER — Other Ambulatory Visit: Payer: Self-pay | Admitting: Neurological Surgery

## 2011-04-26 DIAGNOSIS — M961 Postlaminectomy syndrome, not elsewhere classified: Secondary | ICD-10-CM

## 2011-04-26 DIAGNOSIS — M79609 Pain in unspecified limb: Secondary | ICD-10-CM

## 2011-04-26 DIAGNOSIS — M48061 Spinal stenosis, lumbar region without neurogenic claudication: Secondary | ICD-10-CM

## 2011-04-26 DIAGNOSIS — M545 Low back pain, unspecified: Secondary | ICD-10-CM

## 2011-05-19 ENCOUNTER — Other Ambulatory Visit: Payer: Self-pay | Admitting: Family Medicine

## 2011-05-19 ENCOUNTER — Ambulatory Visit (HOSPITAL_COMMUNITY)
Admission: RE | Admit: 2011-05-19 | Discharge: 2011-05-19 | Disposition: A | Discharge status: Home / Self Care | Payer: Medicare Other | Source: Ambulatory Visit | Attending: Family Medicine | Admitting: Family Medicine

## 2011-05-19 DIAGNOSIS — I10 Essential (primary) hypertension: Secondary | ICD-10-CM | POA: Insufficient documentation

## 2011-05-19 DIAGNOSIS — R079 Chest pain, unspecified: Secondary | ICD-10-CM

## 2011-05-19 DIAGNOSIS — Z01812 Encounter for preprocedural laboratory examination: Secondary | ICD-10-CM | POA: Insufficient documentation

## 2011-05-19 DIAGNOSIS — I499 Cardiac arrhythmia, unspecified: Secondary | ICD-10-CM | POA: Insufficient documentation

## 2011-05-19 LAB — COMPREHENSIVE METABOLIC PANEL
Albumin: 3.6 g/dL (ref 3.5–5.2)
Alkaline Phosphatase: 114 U/L (ref 39–117)
BUN: 14 mg/dL (ref 6–23)
Calcium: 9.2 mg/dL (ref 8.4–10.5)
Potassium: 3.3 mEq/L — ABNORMAL LOW (ref 3.5–5.1)
Sodium: 141 mEq/L (ref 135–145)
Total Protein: 7.8 g/dL (ref 6.0–8.3)

## 2011-05-19 LAB — CBC
HCT: 42 % (ref 39.0–52.0)
Hemoglobin: 14.6 g/dL (ref 13.0–17.0)
WBC: 9.7 10*3/uL (ref 4.0–10.5)

## 2011-05-19 LAB — LIPID PANEL
Cholesterol: 122 mg/dL (ref 0–200)
HDL: 51 mg/dL (ref >39)
Total CHOL/HDL Ratio: 2.4 RATIO
VLDL: 9 mg/dL (ref 0–40)

## 2011-05-19 LAB — CARDIAC PANEL(CRET KIN+CKTOT+MB+TROPI)
CK, MB: 2.3 ng/mL (ref 0.3–4.0)
Troponin I: <0.3 ng/mL (ref <0.30)

## 2011-05-20 ENCOUNTER — Encounter: Payer: Self-pay | Admitting: Gastroenterology

## 2011-07-19 ENCOUNTER — Ambulatory Visit: Payer: Medicare Other | Admitting: Gastroenterology

## 2011-07-20 ENCOUNTER — Encounter: Payer: Self-pay | Admitting: Gastroenterology

## 2011-07-27 ENCOUNTER — Ambulatory Visit
Admission: RE | Admit: 2011-07-27 | Discharge: 2011-07-27 | Disposition: A | Discharge status: Home / Self Care | Payer: Medicare Other | Source: Ambulatory Visit | Attending: Neurological Surgery | Admitting: Neurological Surgery

## 2011-07-27 ENCOUNTER — Other Ambulatory Visit: Payer: Self-pay | Admitting: Neurological Surgery

## 2011-07-27 DIAGNOSIS — M961 Postlaminectomy syndrome, not elsewhere classified: Secondary | ICD-10-CM

## 2011-07-27 DIAGNOSIS — M48061 Spinal stenosis, lumbar region without neurogenic claudication: Secondary | ICD-10-CM

## 2011-07-27 DIAGNOSIS — M545 Low back pain, unspecified: Secondary | ICD-10-CM

## 2011-07-27 DIAGNOSIS — M79609 Pain in unspecified limb: Secondary | ICD-10-CM

## 2011-07-28 ENCOUNTER — Ambulatory Visit (INDEPENDENT_AMBULATORY_CARE_PROVIDER_SITE_OTHER): Payer: Medicare Other | Admitting: Gastroenterology

## 2011-07-28 ENCOUNTER — Encounter: Payer: Self-pay | Admitting: Gastroenterology

## 2011-07-28 DIAGNOSIS — K219 Gastro-esophageal reflux disease without esophagitis: Secondary | ICD-10-CM

## 2011-07-28 DIAGNOSIS — R1310 Dysphagia, unspecified: Secondary | ICD-10-CM

## 2011-07-28 NOTE — Assessment & Plan Note (Signed)
Rule out early esophageal stricture  Recommendations #1 upper endoscopy with dilatation as indicated  Risks, alternatives, and complications of the procedure, including bleeding, perforation, and possible need for surgery, were explained to the patient.  Patient's questions were answered.  

## 2011-07-28 NOTE — Assessment & Plan Note (Addendum)
By history patient has had reflux for years. Barrett's esophagus should be ruled out.  Recommendations #1 upper endoscopy

## 2011-07-28 NOTE — Patient Instructions (Signed)
Upper GI Endoscopy Upper GI endoscopy means using a flexible scope to look at the esophagus, stomach and upper small bowel. This is done to make a diagnosis in people with heartburn, abdominal pain, or abnormal bleeding. Sometimes an endoscope is needed to remove foreign bodies or food that become stuck in the esophagus; it can also be used to take biopsy samples. For the best results, do not eat or drink for 8 hours before having your upper endoscopy.  To perform the endoscopy, you will probably be sedated and your throat will be numbed with a special spray. The endoscope is then slowly passed down your throat (this will not interfere with your breathing). An endoscopy exam takes 15-30 minutes to complete and there is no real pain. Patients rarely remember much about the procedure. The results of the test may take several days if a biopsy or other test is taken.  You may have a sore throat after an endoscopy exam. Serious complications are very rare. Stick to liquids and soft foods until your pain is better. You should not drive a car or operate any dangerous equipment for at least 24 hours after being sedated. SEEK IMMEDIATE MEDICAL CARE IF:  You have severe throat pain.   You have shortness of breath.   You have bleeding problems.   You have a fever.   You have difficulty recovering from your sedation.  Document Released: 12/16/2004 Document Re-Released: 02/02/2010 [facility] Patient Information 2011 [place], [place]. Your EGD is scheduled on 07/29/2011 at 3pm

## 2011-07-28 NOTE — Progress Notes (Signed)
History of Present Illness: Mr. Penrose is a pleasant 70 year old African American male referred at the request of Dr. Bruna Potter for evaluation of reflux and dysphagia. He's had pyrosis intermittently for years. Approximately a week ago he had fairly severe burning chest discomfort with radiation to his throat. He was given a medicine which presumably was a PPI with resolution of symptoms. He complains of occasional dysphagia to solids. He denies odynophagia. He's had no gastric irritants including nonsteroidals. Colonoscopy one year ago for history of colon polyps demonstrated a small hyperplastic polyp    Review of Systems: He has some loss of balance. He recently tripped and bruised his left knee. Pertinent positive and negative review of systems were noted in the above HPI section. All other review of systems were otherwise negative.    Current Medications, Allergies, Past Medical History, Past Surgical History, Family History and Social History were reviewed in [employer] electronic medical record  Vital signs were reviewed in today's medical record. Physical Exam: General: Well developed , well nourished, no acute distress Head: Normocephalic and atraumatic Eyes:  sclerae anicteric, EOMI Ears: Normal auditory acuity Mouth: No deformity or lesions Lungs: Clear throughout to auscultation Heart: Regular rate and rhythm; no murmurs, rubs or bruits Abdomen: Soft, non tender and non distended. No masses, hepatosplenomegaly or hernias noted. Normal Bowel sounds Rectal:deferred Musculoskeletal: Symmetrical with no gross deformities  Pulses:  Normal pulses noted Extremities: No clubbing, cyanosis, edema or deformities noted Neurological: Alert oriented x 4, grossly nonfocal Psychological:  Alert and cooperative. Normal mood and affect

## 2011-07-29 ENCOUNTER — Ambulatory Visit (AMBULATORY_SURGERY_CENTER): Payer: Medicare Other | Admitting: Gastroenterology

## 2011-07-29 ENCOUNTER — Encounter: Payer: Self-pay | Admitting: Gastroenterology

## 2011-07-29 DIAGNOSIS — R1310 Dysphagia, unspecified: Secondary | ICD-10-CM

## 2011-07-29 DIAGNOSIS — K219 Gastro-esophageal reflux disease without esophagitis: Secondary | ICD-10-CM

## 2011-07-29 DIAGNOSIS — K222 Esophageal obstruction: Secondary | ICD-10-CM

## 2011-07-29 MED ORDER — SODIUM CHLORIDE 0.9 % IV SOLN: 500.0000 mL | INTRAVENOUS | Status: DC

## 2011-07-29 NOTE — Patient Instructions (Signed)
Please read the handouts given to you by your recovery room nurse.    Resume your routine medications today.    Have a soft diet for the rest of today due to your dilitation.      Thank you for choosing [place] for your healthcare needs today.

## 2011-07-30 ENCOUNTER — Telehealth: Payer: Self-pay

## 2011-07-30 NOTE — Telephone Encounter (Signed)

## 2011-09-07 LAB — LIPID PANEL
Cholesterol: 92
HDL: 26 — ABNORMAL LOW
LDL Cholesterol: 50
Total CHOL/HDL Ratio: 3.5
VLDL: 16

## 2011-09-07 LAB — COMPREHENSIVE METABOLIC PANEL
Albumin: 3.6
BUN: 11
Calcium: 9.5
Glucose, Bld: 102 — ABNORMAL HIGH
Potassium: 4.2
Sodium: 143
Total Protein: 7.8

## 2011-09-07 LAB — CBC
Hemoglobin: 15
MCHC: 33.5
Platelets: 164
RDW: 13

## 2011-11-22 ENCOUNTER — Encounter (HOSPITAL_COMMUNITY): Payer: Self-pay | Admitting: Emergency Medicine

## 2011-11-22 ENCOUNTER — Inpatient Hospital Stay (HOSPITAL_COMMUNITY): Payer: Medicare Other

## 2011-11-22 ENCOUNTER — Inpatient Hospital Stay (HOSPITAL_COMMUNITY)
Admission: EM | Admit: 2011-11-22 | Discharge: 2011-11-27 | DRG: 280 | Disposition: A | Discharge status: Home / Self Care | Payer: Medicare Other | Attending: Cardiology | Admitting: Cardiology

## 2011-11-22 ENCOUNTER — Other Ambulatory Visit: Payer: Self-pay

## 2011-11-22 ENCOUNTER — Emergency Department (HOSPITAL_COMMUNITY): Payer: Medicare Other

## 2011-11-22 DIAGNOSIS — I214 Non-ST elevation (NSTEMI) myocardial infarction: Principal | ICD-10-CM | POA: Diagnosis present

## 2011-11-22 DIAGNOSIS — I2510 Atherosclerotic heart disease of native coronary artery without angina pectoris: Secondary | ICD-10-CM | POA: Diagnosis present

## 2011-11-22 DIAGNOSIS — K219 Gastro-esophageal reflux disease without esophagitis: Secondary | ICD-10-CM | POA: Diagnosis present

## 2011-11-22 DIAGNOSIS — I5032 Chronic diastolic (congestive) heart failure: Secondary | ICD-10-CM | POA: Diagnosis present

## 2011-11-22 DIAGNOSIS — R1310 Dysphagia, unspecified: Secondary | ICD-10-CM | POA: Diagnosis present

## 2011-11-22 DIAGNOSIS — I160 Hypertensive urgency: Secondary | ICD-10-CM

## 2011-11-22 DIAGNOSIS — I429 Cardiomyopathy, unspecified: Secondary | ICD-10-CM | POA: Diagnosis present

## 2011-11-22 DIAGNOSIS — J9600 Acute respiratory failure, unspecified whether with hypoxia or hypercapnia: Secondary | ICD-10-CM | POA: Diagnosis present

## 2011-11-22 DIAGNOSIS — I509 Heart failure, unspecified: Secondary | ICD-10-CM

## 2011-11-22 DIAGNOSIS — I5043 Acute on chronic combined systolic (congestive) and diastolic (congestive) heart failure: Secondary | ICD-10-CM | POA: Diagnosis present

## 2011-11-22 DIAGNOSIS — I440 Atrioventricular block, first degree: Secondary | ICD-10-CM | POA: Diagnosis present

## 2011-11-22 DIAGNOSIS — Z981 Arthrodesis status: Secondary | ICD-10-CM

## 2011-11-22 DIAGNOSIS — I2589 Other forms of chronic ischemic heart disease: Secondary | ICD-10-CM | POA: Diagnosis present

## 2011-11-22 DIAGNOSIS — I5022 Chronic systolic (congestive) heart failure: Secondary | ICD-10-CM | POA: Diagnosis present

## 2011-11-22 DIAGNOSIS — I493 Ventricular premature depolarization: Secondary | ICD-10-CM | POA: Diagnosis present

## 2011-11-22 DIAGNOSIS — N289 Disorder of kidney and ureter, unspecified: Secondary | ICD-10-CM | POA: Diagnosis present

## 2011-11-22 DIAGNOSIS — Z79899 Other long term (current) drug therapy: Secondary | ICD-10-CM

## 2011-11-22 DIAGNOSIS — I4891 Unspecified atrial fibrillation: Secondary | ICD-10-CM | POA: Diagnosis present

## 2011-11-22 DIAGNOSIS — I10 Essential (primary) hypertension: Secondary | ICD-10-CM | POA: Diagnosis present

## 2011-11-22 DIAGNOSIS — I219 Acute myocardial infarction, unspecified: Secondary | ICD-10-CM

## 2011-11-22 DIAGNOSIS — I4949 Other premature depolarization: Secondary | ICD-10-CM | POA: Diagnosis present

## 2011-11-22 HISTORY — DX: Acute myocardial infarction, unspecified: I21.9

## 2011-11-22 HISTORY — DX: Atherosclerotic heart disease of native coronary artery without angina pectoris: I25.10

## 2011-11-22 LAB — URINALYSIS, ROUTINE W REFLEX MICROSCOPIC
Bilirubin Urine: NEGATIVE
Glucose, UA: NEGATIVE mg/dL
Ketones, ur: NEGATIVE mg/dL
Nitrite: NEGATIVE
Specific Gravity, Urine: 1.006 (ref 1.005–1.030)
pH: 6 (ref 5.0–8.0)

## 2011-11-22 LAB — CARDIAC PANEL(CRET KIN+CKTOT+MB+TROPI)
CK, MB: 8.8 ng/mL (ref 0.3–4.0)
Relative Index: 2.7 — ABNORMAL HIGH (ref 0.0–2.5)
Relative Index: 5.5 — ABNORMAL HIGH (ref 0.0–2.5)
Total CK: 172 U/L (ref 7–232)
Troponin I: 0.55 ng/mL (ref <0.30)

## 2011-11-22 LAB — MRSA PCR SCREENING: MRSA by PCR: NEGATIVE

## 2011-11-22 LAB — PROTIME-INR: Prothrombin Time: 15.8 seconds — ABNORMAL HIGH (ref 11.6–15.2)

## 2011-11-22 LAB — HEPATIC FUNCTION PANEL
ALT: 17 U/L (ref 0–53)
AST: 22 U/L (ref 0–37)
Albumin: 3.8 g/dL (ref 3.5–5.2)
Alkaline Phosphatase: 109 U/L (ref 39–117)
Indirect Bilirubin: 0.4 mg/dL (ref 0.3–0.9)
Total Protein: 8.4 g/dL — ABNORMAL HIGH (ref 6.0–8.3)

## 2011-11-22 LAB — HEMOGLOBIN A1C: Mean Plasma Glucose: 126 mg/dL — ABNORMAL HIGH (ref <117)

## 2011-11-22 LAB — URINE MICROSCOPIC-ADD ON

## 2011-11-22 LAB — BASIC METABOLIC PANEL
CO2: 27 mEq/L (ref 19–32)
Calcium: 10 mg/dL (ref 8.4–10.5)
Chloride: 101 mEq/L (ref 96–112)
Creatinine, Ser: 1.44 mg/dL — ABNORMAL HIGH (ref 0.50–1.35)
GFR calc Af Amer: 53 mL/min — ABNORMAL LOW (ref >90)
GFR calc non Af Amer: 46 mL/min — ABNORMAL LOW (ref >90)
Glucose, Bld: 128 mg/dL — ABNORMAL HIGH (ref 70–99)
Sodium: 139 mEq/L (ref 135–145)

## 2011-11-22 LAB — TSH: TSH: 3.025 u[IU]/mL (ref 0.350–4.500)

## 2011-11-22 LAB — CBC
HCT: 44.1 % (ref 39.0–52.0)
MCH: 28.8 pg (ref 26.0–34.0)
MCV: 84 fL (ref 78.0–100.0)
Platelets: 182 10*3/uL (ref 150–400)
RBC: 5.49 MIL/uL (ref 4.22–5.81)
RBC: 5.25 MIL/uL (ref 4.22–5.81)
WBC: 6.7 10*3/uL (ref 4.0–10.5)
WBC: 8.6 10*3/uL (ref 4.0–10.5)

## 2011-11-22 LAB — POCT I-STAT, CHEM 8
Calcium, Ion: 1.18 mmol/L (ref 1.12–1.32)
Creatinine, Ser: 1.4 mg/dL — ABNORMAL HIGH (ref 0.50–1.35)
Hemoglobin: 17.3 g/dL — ABNORMAL HIGH (ref 13.0–17.0)
Sodium: 144 mEq/L (ref 135–145)
TCO2: 26 mmol/L (ref 0–100)

## 2011-11-22 LAB — DIFFERENTIAL
Basophils Absolute: 0 10*3/uL (ref 0.0–0.1)
Lymphocytes Relative: 61 % — ABNORMAL HIGH (ref 12–46)
Lymphs Abs: 4 10*3/uL (ref 0.7–4.0)
Neutrophils Relative %: 30 % — ABNORMAL LOW (ref 43–77)

## 2011-11-22 LAB — HEPARIN LEVEL (UNFRACTIONATED): Heparin Unfractionated: 0.15 IU/mL — ABNORMAL LOW (ref 0.30–0.70)

## 2011-11-22 LAB — POCT I-STAT TROPONIN I

## 2011-11-22 LAB — APTT: aPTT: 33 seconds (ref 24–37)

## 2011-11-22 LAB — PRO B NATRIURETIC PEPTIDE: Pro B Natriuretic peptide (BNP): 1580 pg/mL — ABNORMAL HIGH (ref 0–125)

## 2011-11-22 MED ORDER — ONDANSETRON HCL 4 MG/2ML IJ SOLN: 4.0000 mg | Freq: Four times a day (QID) | INTRAMUSCULAR | Status: DC | PRN

## 2011-11-22 MED ORDER — ASPIRIN 300 MG RE SUPP
300.0000 mg | RECTAL | Status: AC
  Filled 2011-11-22: qty 1

## 2011-11-22 MED ORDER — HEPARIN SOD (PORCINE) IN D5W 100 UNIT/ML IV SOLN
1250.0000 [IU]/h | INTRAVENOUS | Status: DC
  Administered 2011-11-23 (×2): 1250 [IU]/h via INTRAVENOUS
  Filled 2011-11-22 (×3): qty 250

## 2011-11-22 MED ORDER — FUROSEMIDE 10 MG/ML IJ SOLN
40.0000 mg | Freq: Once | INTRAMUSCULAR | Status: AC
  Administered 2011-11-22: 40 mg via INTRAVENOUS

## 2011-11-22 MED ORDER — FUROSEMIDE 10 MG/ML IJ SOLN
60.0000 mg | Freq: Three times a day (TID) | INTRAMUSCULAR | Status: DC
  Administered 2011-11-22 – 2011-11-23 (×4): 60 mg via INTRAVENOUS
  Filled 2011-11-22 (×7): qty 6

## 2011-11-22 MED ORDER — NITROGLYCERIN IN D5W 200-5 MCG/ML-% IV SOLN: 20.0000 ug/min | Freq: Once | INTRAVENOUS | Status: DC

## 2011-11-22 MED ORDER — ACETAMINOPHEN 325 MG PO TABS
650.0000 mg | ORAL_TABLET | ORAL | Status: DC | PRN
  Administered 2011-11-23 – 2011-11-25 (×4): 650 mg via ORAL
  Filled 2011-11-22: qty 1
  Filled 2011-11-22 (×3): qty 2

## 2011-11-22 MED ORDER — MORPHINE SULFATE 2 MG/ML IJ SOLN: 2.0000 mg | INTRAMUSCULAR | Status: DC | PRN

## 2011-11-22 MED ORDER — LISINOPRIL 40 MG PO TABS
40.0000 mg | ORAL_TABLET | Freq: Every day | ORAL | Status: DC
  Administered 2011-11-22 – 2011-11-24 (×3): 40 mg via ORAL
  Filled 2011-11-22 (×3): qty 1

## 2011-11-22 MED ORDER — SODIUM CHLORIDE 0.9 % IV SOLN: INTRAVENOUS | Status: DC

## 2011-11-22 MED ORDER — ASPIRIN 81 MG PO CHEW
324.0000 mg | CHEWABLE_TABLET | Freq: Once | ORAL | Status: AC
  Administered 2011-11-22: 324 mg via ORAL
  Filled 2011-11-22: qty 1

## 2011-11-22 MED ORDER — ONDANSETRON HCL 4 MG/2ML IJ SOLN
4.0000 mg | Freq: Four times a day (QID) | INTRAMUSCULAR | Status: DC | PRN
  Administered 2011-11-22: 4 mg via INTRAVENOUS

## 2011-11-22 MED ORDER — METOPROLOL TARTRATE 1 MG/ML IV SOLN
5.0000 mg | Freq: Once | INTRAVENOUS | Status: DC
  Filled 2011-11-22: qty 5

## 2011-11-22 MED ORDER — AMLODIPINE BESYLATE 10 MG PO TABS
10.0000 mg | ORAL_TABLET | Freq: Every day | ORAL | Status: DC
  Administered 2011-11-22 – 2011-11-27 (×6): 10 mg via ORAL
  Filled 2011-11-22 (×6): qty 1

## 2011-11-22 MED ORDER — ASPIRIN 325 MG PO TABS
325.0000 mg | ORAL_TABLET | Freq: Every day | ORAL | Status: DC
  Administered 2011-11-22: 325 mg via ORAL
  Filled 2011-11-22 (×3): qty 1

## 2011-11-22 MED ORDER — NITROGLYCERIN IN D5W 200-5 MCG/ML-% IV SOLN
40.0000 ug/min | INTRAVENOUS | Status: DC
  Administered 2011-11-23: 40 ug/min via INTRAVENOUS
  Filled 2011-11-22: qty 250

## 2011-11-22 MED ORDER — ONDANSETRON HCL 4 MG/2ML IJ SOLN
INTRAMUSCULAR | Status: AC
  Administered 2011-11-22: 4 mg via INTRAVENOUS
  Filled 2011-11-22: qty 2

## 2011-11-22 MED ORDER — TIZANIDINE HCL 4 MG PO TABS
4.0000 mg | ORAL_TABLET | Freq: Three times a day (TID) | ORAL | Status: DC | PRN
  Filled 2011-11-22: qty 1

## 2011-11-22 MED ORDER — FUROSEMIDE 10 MG/ML IJ SOLN
40.0000 mg | INTRAMUSCULAR | Status: AC
  Administered 2011-11-22: 40 mg via INTRAVENOUS

## 2011-11-22 MED ORDER — ALPRAZOLAM 0.25 MG PO TABS
0.2500 mg | ORAL_TABLET | Freq: Two times a day (BID) | ORAL | Status: DC | PRN
  Administered 2011-11-25: 0.25 mg via ORAL
  Filled 2011-11-22: qty 1

## 2011-11-22 MED ORDER — TRAVOPROST (BAK FREE) 0.004 % OP SOLN
1.0000 [drp] | Freq: Every day | OPHTHALMIC | Status: DC
  Administered 2011-11-22 – 2011-11-26 (×5): 1 [drp] via OPHTHALMIC
  Filled 2011-11-22: qty 2.5

## 2011-11-22 MED ORDER — FUROSEMIDE 10 MG/ML IJ SOLN
INTRAMUSCULAR | Status: AC
  Filled 2011-11-22: qty 4

## 2011-11-22 MED ORDER — HEPARIN BOLUS VIA INFUSION
2000.0000 [IU] | Freq: Once | INTRAVENOUS | Status: AC
  Filled 2011-11-22: qty 2000

## 2011-11-22 MED ORDER — PANTOPRAZOLE SODIUM 40 MG PO TBEC
40.0000 mg | DELAYED_RELEASE_TABLET | Freq: Every day | ORAL | Status: DC
  Administered 2011-11-22 – 2011-11-27 (×5): 40 mg via ORAL
  Filled 2011-11-22 (×5): qty 1

## 2011-11-22 MED ORDER — ROSUVASTATIN CALCIUM 10 MG PO TABS
10.0000 mg | ORAL_TABLET | Freq: Every day | ORAL | Status: DC
  Administered 2011-11-22 – 2011-11-26 (×5): 10 mg via ORAL
  Filled 2011-11-22 (×6): qty 1

## 2011-11-22 MED ORDER — SODIUM CHLORIDE 0.9 % IV SOLN
Freq: Once | INTRAVENOUS | Status: AC
  Administered 2011-11-22: 01:00:00 via INTRAVENOUS

## 2011-11-22 MED ORDER — HEPARIN SOD (PORCINE) IN D5W 100 UNIT/ML IV SOLN
1000.0000 [IU]/h | INTRAVENOUS | Status: DC
  Administered 2011-11-22: 1000 [IU]/h via INTRAVENOUS
  Filled 2011-11-22 (×2): qty 250

## 2011-11-22 MED ORDER — ASPIRIN 81 MG PO CHEW: 324.0000 mg | CHEWABLE_TABLET | ORAL | Status: AC

## 2011-11-22 MED ORDER — NITROGLYCERIN 0.4 MG SL SUBL: 0.4000 mg | SUBLINGUAL_TABLET | SUBLINGUAL | Status: DC | PRN

## 2011-11-22 MED ORDER — NITROGLYCERIN 5 MG/ML IV SOLN: 20.0000 ug/kg/min | INTRAVENOUS | Status: DC

## 2011-11-22 MED ORDER — POTASSIUM CHLORIDE CRYS ER 20 MEQ PO TBCR
40.0000 meq | EXTENDED_RELEASE_TABLET | Freq: Once | ORAL | Status: AC
  Administered 2011-11-22: 40 meq via ORAL
  Filled 2011-11-22: qty 2

## 2011-11-22 MED ORDER — HEPARIN BOLUS VIA INFUSION
4000.0000 [IU] | Freq: Once | INTRAVENOUS | Status: AC
  Administered 2011-11-22: 4000 [IU] via INTRAVENOUS
  Filled 2011-11-22: qty 4000

## 2011-11-22 MED ORDER — NITROGLYCERIN IN D5W 200-5 MCG/ML-% IV SOLN
INTRAVENOUS | Status: AC
  Filled 2011-11-22: qty 250

## 2011-11-22 MED ORDER — ASPIRIN EC 81 MG PO TBEC
81.0000 mg | DELAYED_RELEASE_TABLET | Freq: Every day | ORAL | Status: DC
  Administered 2011-11-23 – 2011-11-24 (×2): 81 mg via ORAL
  Filled 2011-11-22 (×2): qty 1

## 2011-11-22 MED ORDER — METOPROLOL TARTRATE 25 MG PO TABS
25.0000 mg | ORAL_TABLET | Freq: Two times a day (BID) | ORAL | Status: DC
  Administered 2011-11-22 – 2011-11-23 (×3): 25 mg via ORAL
  Filled 2011-11-22 (×4): qty 1

## 2011-11-22 MED ORDER — POTASSIUM CHLORIDE CRYS ER 20 MEQ PO TBCR
20.0000 meq | EXTENDED_RELEASE_TABLET | Freq: Two times a day (BID) | ORAL | Status: DC
  Administered 2011-11-22 – 2011-11-24 (×6): 20 meq via ORAL
  Filled 2011-11-22 (×9): qty 1

## 2011-11-22 NOTE — ED Notes (Signed)
EKG given to and signed by Dr. Hyacinth Meeker; Dr. Hyacinth Meeker wants repeat EKG.

## 2011-11-22 NOTE — Progress Notes (Signed)
ANTICOAGULATION CONSULT NOTE - Follow Up Consult  Pharmacy Consult for Heparin Indication: Chest Pain/STEMI  No Known Allergies  Patient Measurements: Height: 5\' 3"  (160 cm) Weight: 161 lb 2.5 oz (73.1 kg) IBW/kg (Calculated) : 56.9  Heparin Dosing Weight: 71.7 kg  Vital Signs: Temp: 98.2 F (36.8 C) (12/31 1600) Temp src: Oral (12/31 1600) BP: 121/72 mmHg (12/31 1900) Pulse Rate: 69  (12/31 1900)  Labs:  Basename 11/22/11 1754 11/22/11 0946 11/22/11 0936 11/22/11 0935 11/22/11 0105 11/22/11 0046  HGB -- -- -- 15.1 17.3* --  HCT -- -- -- 44.1 51.0 46.6  PLT -- -- -- 174 -- 182  APTT -- -- -- -- -- 33  LABPROT -- -- -- -- -- 15.8*  INR -- -- -- -- -- 1.23  HEPARINUNFRC 0.15* -- 0.33 -- -- --  CREATININE -- -- -- 1.44* 1.40* 1.47*  CKTOTAL 172 193 -- -- -- 190  CKMB 8.8* 10.6* -- -- -- 5.1*  TROPONINI 0.71* 0.55* -- -- -- <0.30   Estimated Creatinine Clearance: 41.6 ml/min (by C-G formula based on Cr of 1.44).   Medications:  Infusions:     . sodium chloride 10 mL/hr at 11/22/11 0800  . heparin 10 mL/hr (11/22/11 1900)  . nitroGLYCERIN 30 mcg/min (11/22/11 1900)  . DISCONTD: nitroGLYCERIN Pediatric IV Infusion      Assessment: Heparin level is subtherapeutic on 1000 units/hr.  No bleeding noted per chart.  70 yo male with history of CHF/HTN/GERD who presents with SOB. He c/o left-sided chest pain and ECG is chaotic but found to have atrial fibrillation. He has a history of CAD with multi-vessel stenosis.   Pharmacy Problem List/Assessment:  Anticoagulation: CP/STEMI- on IV heparin, Heparin level therapeutic at 0.33 on 1000 units/hr (36ml/hr). H/H stable, plts 174, no bleeding reported.  Infectious disease: wbc 8.6,  Afebrile, no antibiotics Pulm:  97% 2L [place]  Card: CP/Stemi- Trop 0.55, known CAD and Afib, BP 134/82, HR 70, on IV heparin and IV NTG, norvasc, asa, IV lasix, bb, acei. Will need cath- but holding off for now. BP 134/82, HR 70.  FEN/GI/Endo: GLU  128-167, no insulin on board. F/U CBGs-may need insulin coverage.  Renal: SCr 1.44, estCrCl~42, UOP~0.5cc/kg/hr (picking up this am) Best practices: IV heparin, PPI po  Goal of Therapy:  Heparin level 0.3-0.7 units/ml   Plan:  1. Give heparin bolus of 2000 units x 1, then increase heparin gtt to 1250 units/hr.  2. Recheck heparin level in 8 hours.  3. Continue daily heparin level and CBC.  Mindy Gali C 11/22/2011,7:39 PM

## 2011-11-22 NOTE — H&P (Signed)
[name] is an 70 y.o. male.    Primary Cardiology: Dr. Nicki Guadalajara  Chief Complaint: chest pain and significant shortness of breath HPI: 70 year old African American male presents to the emergency room tonight with chest pain and significant shortness of breath. Initial symptoms began Saturday he had chest pain and shortness of breath it lasted a couple of hours and then resolved he was fairly stable until tonight suddenly he was having chest pain no radiation midsternal sharp feeling and then became significantly short of breath. It is associated with nausea no vomiting positive diaphoresis. Came to the emergency room and was found to have acute pulmonary edema with respiratory failure acutely. Was placed on BiPAP given IV Lasix now improving in his symptoms. He continues with some chest pain now on IV nitroglycerin drip which we will titrate. EKG is chaotic with frequent PVCs which he does have a history he appears to be in sinus rhythm with a first-degree block which is normal for him though on this particular EKG to be in atrial fibrillation.   He has a history of coronary artery disease by cardiac catheterization in February 2009 at which time he was found to have an 80% diffuse narrowing in the proximal third of the moderate-sized diagonal branch of the LAD as well as a 30% stenosis and underwent to branch of the circumflex vessel. He has been on medical therapy and has done quite well until this evening.  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 He is wearing a monitor from our office at this time to understand his PVC burden.  Other history does include low back surgery on several occasions.  Past Medical History  Diagnosis Date  . Colon polyp, hyperplastic   . Spondylolisthesis   . GERD (gastroesophageal reflux disease)   . Hypertension   . CHF (congestive heart failure)   . Unstable angina 11/22/2011  . Respiratory  failure, acute 11/22/2011  . CAD (coronary artery disease) 80% stenosis diag of the LAD, 30% in OM2 branch of LCX in 2009 11/22/2011  . Accelerated hypertension 11/22/2011  . Ventricular ectopy Hx of PVCs 11/22/2011    Past Surgical History  Procedure Date  . Posteroir lumbar fusion   . Cystoscopy   . Rotator cuff repair   . Back surgery     History reviewed. No pertinent family history. Patient does not know what his parents died of Social History:  reports that he has never smoked. He has never used smokeless tobacco. He reports that he does not drink alcohol or use illicit drugs. married  Allergies: No Known Allergies  Medications Prior to Admission  Medication Dose Route Frequency Provider Last Rate Last Dose  . 0.9 %  sodium chloride infusion   Intravenous Once Vida Roller, MD 75 mL/hr at 11/22/11 0110    . aspirin chewable tablet 324 mg  324 mg Oral Once Vida Roller, MD   324 mg at 11/22/11 0105  . furosemide (LASIX) injection 40 mg  40 mg Intravenous STAT Vida Roller, MD   40 mg at 11/22/11 0106  . nitroGLYCERIN 0.2 mg/mL in dextrose 5 % infusion  20 mcg/min Intravenous Once Vida Roller, MD 12 mL/hr at 11/22/11 0204 40 mcg at 11/22/11 0204  . potassium chloride SA (K-DUR,KLOR-CON) CR tablet 40 mEq  40 mEq Oral Once Leone Brand, NP      . DISCONTD: nitroGLYCERIN 400 mcg/mL in dextrose 5 % 250 mL  infusion  20 mcg/kg/min Intravenous Continuous Vida Roller, MD       Medications Prior to Admission  Medication Sig Dispense Refill  . amLODipine (NORVASC) 10 MG tablet Take 10 mg by mouth daily.        Marland Kitchen aspirin 325 MG tablet Take 325 mg by mouth daily.        . furosemide (LASIX) 20 MG tablet Take 20 mg by mouth daily.        Marland Kitchen lisinopril (PRINIVIL,ZESTRIL) 40 MG tablet Take 40 mg by mouth daily.        . metoprolol (TOPROL-XL) 50 MG 24 hr tablet Take 50 mg by mouth daily.        . pantoprazole (PROTONIX) 40 MG tablet Take 40 mg by mouth daily.        .  Travoprost, BAK Free, (TRAVATAMN) 0.004 % SOLN ophthalmic solution Place 1 drop into both eyes at bedtime.        patient is also on spironolactone at 12.5 mg daily this was started in November of 2012. I also have him taking 75 mg of Toprol instead of 50 mg. Also on Tizanidine 4 mg PO TID prn Results for orders placed during the hospital encounter of 11/22/11 (from the past 48 hour(s))  BASIC METABOLIC PANEL     Status: Abnormal   Collection Time   11/22/11 12:46 AM      Component Value Range Comment   Sodium 139  135 - 145 (mEq/L)    Potassium 3.3 (*) 3.5 - 5.1 (mEq/L)    Chloride 102  96 - 112 (mEq/L)    CO2 26  19 - 32 (mEq/L)    Glucose, Bld 151 (*) 70 - 99 (mg/dL)    BUN 13  6 - 23 (mg/dL)    Creatinine, Ser 6.21 (*) 0.50 - 1.35 (mg/dL)    Calcium 30.8  8.4 - 10.5 (mg/dL)    GFR calc non Af Amer 46 (*) >90 (mL/min)    GFR calc Af Amer 53 (*) >90 (mL/min)   CBC     Status: Normal   Collection Time   11/22/11 12:46 AM      Component Value Range Comment   WBC 6.7  4.0 - 10.5 (K/uL)    RBC 5.49  4.22 - 5.81 (MIL/uL)    Hemoglobin 16.2  13.0 - 17.0 (g/dL)    HCT 65.7  84.6 - 96.2 (%)    MCV 84.9  78.0 - 100.0 (fL)    MCH 29.5  26.0 - 34.0 (pg)    MCHC 34.8  30.0 - 36.0 (g/dL)    RDW 95.2  84.1 - 32.4 (%)    Platelets 182  150 - 400 (K/uL)   DIFFERENTIAL     Status: Abnormal   Collection Time   11/22/11 12:46 AM      Component Value Range Comment   Neutrophils Relative 30 (*) 43 - 77 (%)    Neutro Abs 2.0  1.7 - 7.7 (K/uL)    Lymphocytes Relative 61 (*) 12 - 46 (%)    Lymphs Abs 4.0  0.7 - 4.0 (K/uL)    Monocytes Relative 6  3 - 12 (%)    Monocytes Absolute 0.4  0.1 - 1.0 (K/uL)    Eosinophils Relative 3  0 - 5 (%)    Eosinophils Absolute 0.2  0.0 - 0.7 (K/uL)    Basophils Relative 1  0 - 1 (%)    Basophils Absolute 0.0  0.0 -  0.1 (K/uL)   APTT     Status: Normal   Collection Time   11/22/11 12:46 AM      Component Value Range Comment   aPTT 33  24 - 37 (seconds)     PROTIME-INR     Status: Abnormal   Collection Time   11/22/11 12:46 AM      Component Value Range Comment   Prothrombin Time 15.8 (*) 11.6 - 15.2 (seconds)    INR 1.23  0.00 - 1.49    PRO B NATRIURETIC PEPTIDE     Status: Abnormal   Collection Time   11/22/11 12:50 AM      Component Value Range Comment   Pro B Natriuretic peptide (BNP) 1580.0 (*) 0 - 125 (pg/mL)   POCT I-STAT TROPONIN I     Status: Normal   Collection Time   11/22/11  1:02 AM      Component Value Range Comment   Troponin i, poc 0.02  0.00 - 0.08 (ng/mL)    Comment 3            POCT I-STAT, CHEM 8     Status: Abnormal   Collection Time   11/22/11  1:05 AM      Component Value Range Comment   Sodium 144  135 - 145 (mEq/L)    Potassium 3.2 (*) 3.5 - 5.1 (mEq/L)    Chloride 104  96 - 112 (mEq/L)    BUN 13  6 - 23 (mg/dL)    Creatinine, Ser 4.09 (*) 0.50 - 1.35 (mg/dL)    Glucose, Bld 811 (*) 70 - 99 (mg/dL)    Calcium, Ion 9.14  1.12 - 1.32 (mmol/L)    TCO2 26  0 - 100 (mmol/L)    Hemoglobin 17.3 (*) 13.0 - 17.0 (g/dL)    HCT 78.2  95.6 - 21.3 (%)    Dg Chest Port 1 View  11/22/2011  *RADIOLOGY REPORT*  Clinical Data: Chest pain and shortness of breath.  PORTABLE CHEST - 1 VIEW  Comparison: 05/19/2011  Findings: Borderline heart size with interval development of increased pulmonary vascularity and interstitial changes suggesting edema.  No focal consolidation.  Diffuse interstitial pneumonia is not excluded.  No blunting of costophrenic angles.  No pneumothorax.  Tortuous aorta.  IMPRESSION: Cardiac enlargement with pulmonary vascular congestion and diffuse interstitial changes likely represent interstitial edema although diffuse interstitial pneumonia is not excluded.  Original Report Authenticated By: Marlon Pel, M.D.    ROS: general: Chest pain on Saturday and again today, no colds or fevers. Skin: No rashes or ulcers. HEENT: No blurred vision or double vision. Cardiovascular chest pain as stated on  Saturday it resolved after several hours and again it started tonight this time associated with severe shortness of breath. Pulmonary: Significant shortness of breath tonight. GI: Denies diarrhea constipation melena. GU: Denies hematuria dysuria. Musculoskeletal: History of several back surgeries lumbar area no current complaints. Endocrine: Denies diabetes or thyroid disease. Neuro: No syncope or dizziness.   Blood pressure 160/109, pulse 76, temperature 97.7 F (36.5 C), temperature source Oral, resp. rate 25, SpO2 98.00%.   PE: general: Alert oriented African American male obviously in distress but does state he feels better.affect is pleasant though anxious Continues with chest pain and shortness of breath. Skin: Warm and dry brisk capillary refill. HEENT: Normocephalic sclera clear. Neck supple positive JVD, no carotid bruits. Heart: S1-S2 regular with premature beats no obvious murmurs at this time no gallop or rub. Lungs: Rales throughout  his lung fields no wheezes currently. Abdomen: Soft nontender positive bowel sounds in palpate liver spleen or masses. Extremities: No lower extremity edema,  1+ pedal pulses  Neuro: Alert oriented x3 follows commands moves all extremities.  Assessment/Plan Patient Active Problem List  Diagnoses  . Esophageal reflux  . Dysphagia, unspecified  . Unstable angina  . Respiratory failure, acute  . CAD (coronary artery disease) 80% stenosis diag of the LAD, 30% in OM2 branch of LCX in 2009  . Accelerated hypertension  . Ventricular ectopy Hx of PVCs   Plan: titrate up IV nitroglycerin, to help control blood pressure. Patient states he's been taking his medications as prescribed. Serial cardiac enzymes. Continue IV Lasix to diurese. Continue BiPAP as needed. Admits to an intensive care bed. We'll give IV Lopressor tonight and change over to by mouth in the morning. Will add Foley catheter as patient is becoming fatigued with voiding frequently. Once  respiratory status is stabilized the patient most likely will need cardiac catheterization.  On recheck of pt. With NTG drip at 40 mcg pain is resolved.  SOB improving though continues with rales, will repeat Lasix now.  INGOLD,LAURA R 11/22/2011, 2:11 AM  I have seen and examined the patient this AM after the patient was skillfully stabilized by Nada Boozer, NP.  I have reviewed the chart, notes and new data.  I agree with Laura's note.  Key new complaints: Feeling much better now, off BiPAP, Breathing better & no more CP Key examination changes: bi-basal rales ~1/3 up, c/w Xray Key new findings / data: CXR personally reviewed - #2 looks much better, Cr ~1.4 (not sure of baseline)  Mr. Migliaccio Sx & findings of acute pulmonary edema are very consistent with Acute Coronary Syndrome -- Saturday as [place] - & last PM/early AM today may well be NSTEMI.  ECG is (as described) very erratic - looks like frequent PVCs, but no clear evidence of ST E - will recheck here now that he is more stable.  I suspect that his cardiac markers will be positive.  Given presentation with CHF (likley acute diastolic +/- systolic - Echo done, will need to read) & Anginal Sx, he is relatively High TIMI Risk.    PLAN:  Will keep in ICU setting for now.  Continue to aggressively diurese with lasix + afterload reduction with nitrate.  Cycle cardiac bio-markers, will not be aggressive w/ BB given acute decompensated HF - use IV nitrate.  Will need catheterization, but likely not today as he is just now off BiPap, but still has notable pulm edema on CXR.   Marykay Lex, M.D., M.S. [facility] [address] [place], [place]  [address]  [phone]  11/22/2011 10:15 AM

## 2011-11-22 NOTE — ED Notes (Addendum)
Patient complaining of shortness of breath and chest pain that began around 0000 tonight.  Patient wearing portable heart monitor (wearing for 30 days) -- patient reports irregular heartbeat and CHF.  Breathing is labored and shallow; 30 breaths per minute.  O2 82%; 4 liters O2 applied.  Patient alert and oriented x4.  Patient moved to exam room.

## 2011-11-22 NOTE — Progress Notes (Signed)
ANTICOAGULATION CONSULT NOTE - Follow Up Consult  Pharmacy Consult for Heparin Indication: Chest Pain/STEMI  No Known Allergies  Patient Measurements: Height: 5\' 3"  (160 cm) Weight: 161 lb 2.5 oz (73.1 kg) IBW/kg (Calculated) : 56.9  Heparin Dosing Weight: 71.7 kg  Vital Signs: Temp: 97.5 F (36.4 C) (12/31 0815) Temp src: Axillary (12/31 0815) BP: 119/80 mmHg (12/31 1105) Pulse Rate: 76  (12/31 1105)  Labs:  Basename 11/22/11 0946 11/22/11 0936 11/22/11 0935 11/22/11 0105 11/22/11 0046  HGB -- -- 15.1 17.3* --  HCT -- -- 44.1 51.0 46.6  PLT -- -- 174 -- 182  APTT -- -- -- -- 33  LABPROT -- -- -- -- 15.8*  INR -- -- -- -- 1.23  HEPARINUNFRC -- 0.33 -- -- --  CREATININE -- -- 1.44* 1.40* 1.47*  CKTOTAL 193 -- -- -- 190  CKMB 10.6* -- -- -- 5.1*  TROPONINI 0.55* -- -- -- <0.30   Estimated Creatinine Clearance: 41.6 ml/min (by C-G formula based on Cr of 1.44).   Medications:  Infusions:    . sodium chloride 10 mL/hr at 11/22/11 0800  . heparin 10 mL/hr (11/22/11 1100)  . nitroGLYCERIN 30 mcg/min (11/22/11 1100)  . DISCONTD: nitroGLYCERIN Pediatric IV Infusion      Assessment: 70 yo male with history of CHF/HTN/GERD who presents with SOB. He c/o left-sided chest pain and ECG is chaotic but found to have atrial fibrillation. He has a history of CAD with multi-vessel stenosis.  Pharmacy Problem List/Assessment:  Anticoagulation: CP/STEMI- on IV heparin, Heparin level therapeutic at 0.33 on 1000 units/hr (51ml/hr). H/H stable, plts 174, no bleeding reported.  Infectious disease: wbc 8.6,  Afebrile, no antibiotics Pulm:  97% 2L [place]  Card: CP/Stemi- Trop 0.55, known CAD and Afib, BP 134/82, HR 70, on IV heparin and IV NTG, norvasc, asa, IV lasix, bb, acei. Will need cath- but holding off for now. BP 134/82, HR 70.  FEN/GI/Endo: GLU 128-167, no insulin on board. F/U CBGs-may need insulin coverage.  Renal: SCr 1.44, estCrCl~42, UOP~0.5cc/kg/hr (picking up this am) Best  practices: IV heparin, PPI po  Goal of Therapy:  Heparin level 0.3-0.7 units/ml   Plan:  1. Continue Heparin at 1000 unit/hr. 2. Recheck heparin level in 8 hours.   Fayne Norrie 11/22/2011,11:09 AM

## 2011-11-22 NOTE — Progress Notes (Signed)
ANTICOAGULATION CONSULT NOTE - Initial Consult  Pharmacy Consult for  Heparin Indication:  Chest Pain/STEMI  No Known Allergies  Patient Measurements: Weight back in September of this year was 77kg  Vital Signs: Temp: 97.7 F (36.5 C) (12/31 0040) Temp src: Oral (12/31 0040) BP: 160/109 mmHg (12/31 0145) Pulse Rate: 76  (12/31 0145)  Labs:  Basename 11/22/11 0105 11/22/11 0046  HGB 17.3* 16.2  HCT 51.0 46.6  PLT -- 182  APTT -- 33  LABPROT -- 15.8*  INR -- 1.23  HEPARINUNFRC -- --  CREATININE 1.40* 1.47*  CKTOTAL -- 190  CKMB -- 5.1*  TROPONINI -- <0.30   The CrCl is unknown because both a height and weight (above a minimum accepted value) are required for this calculation.  Medical History: Past Medical History  Diagnosis Date  . Colon polyp, hyperplastic   . Spondylolisthesis   . GERD (gastroesophageal reflux disease)   . Hypertension   . CHF (congestive heart failure)   . Unstable angina 11/22/2011  . Respiratory failure, acute 11/22/2011  . CAD (coronary artery disease) 80% stenosis diag of the LAD, 30% in OM2 branch of LCX in 2009 11/22/2011  . Accelerated hypertension 11/22/2011  . Ventricular ectopy Hx of PVCs 11/22/2011    Medications:  Amlodipine 10 mg daily Aspirin 325 mg daily Furosemide 20 mg daily Lisinopril 40 mg daily Metoprolol XL 50 mg daily Pantoprazole 40 mg daily Travoprost 0.004% 1 drop both eyes at bedtime  Assessment: 70 yo male with history of CHF/HTN/GERD who presents with SOB.  He c/o left-sided chest pain and ECG is chaotic but found to have atrial fibrillation.  He has a history of CAD with multi-vessel stenosis.  Goal of Therapy:  Heparin level 0.3-0.7 units/ml   Plan:  Begin IV heparin with 4000 units IV bolus and start IV heparin drip at 1000 units/hr. Check 6 hour level and adjust accordingly to meet goal.  Nadara Mustard Pharm.D., MS Clinical Pharmacist [phone] 11/22/2011,3:03 AM

## 2011-11-22 NOTE — Progress Notes (Signed)
  Echocardiogram 2D Echocardiogram has been performed.  [name] [facility] 11/22/2011, 9:30 AM

## 2011-11-22 NOTE — ED Provider Notes (Signed)
History     CSN: [id number]  Arrival date & time 11/22/11  0033   First MD Initiated Contact with Patient 11/22/11 0047      Chief Complaint  Patient presents with  . Chest Pain  . Shortness of Breath    (Consider location/radiation/quality/duration/timing/severity/associated sxs/prior treatment) HPI Comments: 70 year old male with a history of congestive heart failure, hypertension, GERD who presents with approximately one hour of severe shortness of breath. He states that this came on abruptly, is severe, worse with lying supine or exertion and is associated with left-sided chest pain. He denies lower extremity swelling. He denies fevers, cough, dysuria, diarrhea. He states that he has not been feeling well today but is a very poor historian and cannot clarify what was feeling bad.  Patient is a 70 y.o. male presenting with chest pain and shortness of breath. The history is provided by the patient and medical records.  Chest Pain Primary symptoms include shortness of breath.    Shortness of Breath  Associated symptoms include chest pain and shortness of breath.    Past Medical History  Diagnosis Date  . Colon polyp, hyperplastic   . Spondylolisthesis   . GERD (gastroesophageal reflux disease)   . Hypertension   . CHF (congestive heart failure)     Past Surgical History  Procedure Date  . Posteroir lumbar fusion   . Cystoscopy   . Rotator cuff repair     History reviewed. No pertinent family history.  History  Substance Use Topics  . Smoking status: Never Smoker   . Smokeless tobacco: Never Used  . Alcohol Use: No      Review of Systems  Respiratory: Positive for shortness of breath.   Cardiovascular: Positive for chest pain.  All other systems reviewed and are negative.    Allergies  Review of patient's allergies indicates no known allergies.  Home Medications   Current Outpatient Rx  Name Route Sig Dispense Refill  . AMLODIPINE BESYLATE 10 MG PO  TABS Oral Take 10 mg by mouth daily.      . ASPIRIN 325 MG PO TABS Oral Take 325 mg by mouth daily.      . FUROSEMIDE 20 MG PO TABS Oral Take 20 mg by mouth daily.      Marland Kitchen LISINOPRIL 40 MG PO TABS Oral Take 40 mg by mouth daily.      Marland Kitchen METOPROLOL SUCCINATE ER 50 MG PO TB24 Oral Take 50 mg by mouth daily.      Marland Kitchen PANTOPRAZOLE SODIUM 40 MG PO TBEC Oral Take 40 mg by mouth daily.      . TRAVOPROST (BAK FREE) 0.004 % OP SOLN Both Eyes Place 1 drop into both eyes at bedtime.        BP 219/150  Pulse 116  Temp(Src) 97.7 F (36.5 C) (Oral)  Resp 32  SpO2 97%  Physical Exam  Nursing note and vitals reviewed. Constitutional: He appears well-developed and well-nourished. No distress.  HENT:  Head: Normocephalic and atraumatic.  Mouth/Throat: Oropharynx is clear and moist. No oropharyngeal exudate.  Eyes: Conjunctivae and EOM are normal. Pupils are equal, round, and reactive to light. Right eye exhibits no discharge. Left eye exhibits no discharge. No scleral icterus.  Neck: Normal range of motion. Neck supple. No JVD present. No thyromegaly present.  Cardiovascular: Regular rhythm, normal heart sounds and intact distal pulses.  Exam reveals no gallop and no friction rub.   No murmur heard.      JVD present,  tachycardia  Pulmonary/Chest: He is in respiratory distress. He has no wheezes. He has rales.       Tachypnea to 30, diffuse rales, increased work of breathing, speaks in short sentences.  Abdominal: Soft. Bowel sounds are normal. He exhibits no distension and no mass. There is no tenderness.  Musculoskeletal: Normal range of motion. He exhibits no edema and no tenderness.  Lymphadenopathy:    He has no cervical adenopathy.  Neurological: He is alert. Coordination normal.  Skin: Skin is warm and dry. No rash noted. No erythema.  Psychiatric: He has a normal mood and affect. His behavior is normal.    ED Course  Procedures (including critical care time)  Labs Reviewed  BASIC  METABOLIC PANEL - Abnormal; Notable for the following:    Potassium 3.3 (*)    Glucose, Bld 151 (*)    Creatinine, Ser 1.47 (*)    GFR calc non Af Amer 46 (*)    GFR calc Af Amer 53 (*)    All other components within normal limits  DIFFERENTIAL - Abnormal; Notable for the following:    Neutrophils Relative 30 (*)    Lymphocytes Relative 61 (*)    All other components within normal limits  PROTIME-INR - Abnormal; Notable for the following:    Prothrombin Time 15.8 (*)    All other components within normal limits  PRO B NATRIURETIC PEPTIDE - Abnormal; Notable for the following:    Pro B Natriuretic peptide (BNP) 1580.0 (*)    All other components within normal limits  POCT I-STAT, CHEM 8 - Abnormal; Notable for the following:    Potassium 3.2 (*)    Creatinine, Ser 1.40 (*)    Glucose, Bld 151 (*)    Hemoglobin 17.3 (*)    All other components within normal limits  CBC  APTT  POCT I-STAT TROPONIN I  I-STAT TROPONIN I  I-STAT, CHEM 8   Dg Chest Port 1 View  11/22/2011  *RADIOLOGY REPORT*  Clinical Data: Chest pain and shortness of breath.  PORTABLE CHEST - 1 VIEW  Comparison: 05/19/2011  Findings: Borderline heart size with interval development of increased pulmonary vascularity and interstitial changes suggesting edema.  No focal consolidation.  Diffuse interstitial pneumonia is not excluded.  No blunting of costophrenic angles.  No pneumothorax.  Tortuous aorta.  IMPRESSION: Cardiac enlargement with pulmonary vascular congestion and diffuse interstitial changes likely represent interstitial edema although diffuse interstitial pneumonia is not excluded.  Original Report Authenticated By: Marlon Pel, M.D.     1. Congestive heart failure   2. Renal insufficiency   3. Hypertensive urgency       MDM  Patient in moderate respiratory distress with severe hypertension at 198/132, hypoxia to 88% on [place] and diffuse rales. I suspect that this is congestive heart failure,  nitroglycerin drip, BiPAP, chest x-ray, labs, aspirin.  CC being delivered   ED ECG REPORT   Date: 11/22/2011   Rate: 107  Rhythm: sinus tachycardia  QRS Axis: right  Intervals: Frequent PVC's, QRS normal  ST/T Wave abnormalities: nonspecific ST/T changes  Conduction Disutrbances:none  Narrative Interpretation:   Old EKG Reviewed: changes noted and Since last tracing from 05/19/2011, now has sinus tachycardia and frequent PVCs. LVH persistent   Discussed care with [facility] heart and vascular nurse practitioner who will come to see the patient. Currently on a nitroglycerin drip, oxygen is improved on BiPAP, tachypnea has improved., Critical care provided.  CRITICAL CARE Performed by: Vida Roller   Total  critical care time: 45  Critical care time was exclusive of separately billable procedures and treating other patients.  Critical care was necessary to treat or prevent imminent or life-threatening deterioration.  Critical care was time spent personally by me on the following activities: development of treatment plan with patient and/or surrogate as well as nursing, discussions with consultants, evaluation of patient's response to treatment, examination of patient, obtaining history from patient or surrogate, ordering and performing treatments and interventions, ordering and review of laboratory studies, ordering and review of radiographic studies, pulse oximetry and re-evaluation of patient's condition.     Vida Roller, MD 11/22/11 [phone]

## 2011-11-22 NOTE — Progress Notes (Signed)
CRITICAL VALUE ALERT  Critical value received: CKMB 10.7, Troponin 0.55  Date of notification:  11/22/11  Time of notification:  1040  Critical value read back:yes  Nurse who received alert:  Herbert Seta, RN   MD notified (1st page): Nada Boozer, NP  Time of first page:  1045  MD notified (2nd page):  Time of second page:  Responding MD:  Nada Boozer, NP   Time MD responded:  [phone]

## 2011-11-23 DIAGNOSIS — I5032 Chronic diastolic (congestive) heart failure: Secondary | ICD-10-CM | POA: Diagnosis present

## 2011-11-23 DIAGNOSIS — I5022 Chronic systolic (congestive) heart failure: Secondary | ICD-10-CM | POA: Diagnosis present

## 2011-11-23 HISTORY — PX: CARDIAC CATHETERIZATION: SHX172

## 2011-11-23 LAB — CBC
Hemoglobin: 14.2 g/dL (ref 13.0–17.0)
Platelets: 176 10*3/uL (ref 150–400)
RBC: 4.93 MIL/uL (ref 4.22–5.81)
WBC: 8.4 10*3/uL (ref 4.0–10.5)

## 2011-11-23 LAB — BASIC METABOLIC PANEL
Calcium: 9.2 mg/dL (ref 8.4–10.5)
GFR calc Af Amer: 43 mL/min — ABNORMAL LOW (ref >90)
GFR calc non Af Amer: 37 mL/min — ABNORMAL LOW (ref >90)
Glucose, Bld: 134 mg/dL — ABNORMAL HIGH (ref 70–99)
Potassium: 3.8 mEq/L (ref 3.5–5.1)
Sodium: 138 mEq/L (ref 135–145)

## 2011-11-23 LAB — CARDIAC PANEL(CRET KIN+CKTOT+MB+TROPI)
Relative Index: 3.9 — ABNORMAL HIGH (ref 0.0–2.5)
Troponin I: 0.36 ng/mL (ref <0.30)

## 2011-11-23 LAB — HEPARIN LEVEL (UNFRACTIONATED): Heparin Unfractionated: 0.42 IU/mL (ref 0.30–0.70)

## 2011-11-23 LAB — LIPID PANEL
Cholesterol: 136 mg/dL (ref 0–200)
HDL: 60 mg/dL (ref >39)
Total CHOL/HDL Ratio: 2.3 RATIO
Triglycerides: 43 mg/dL (ref <150)
VLDL: 9 mg/dL (ref 0–40)

## 2011-11-23 MED ORDER — SODIUM CHLORIDE 0.9 % IJ SOLN
3.0000 mL | Freq: Two times a day (BID) | INTRAMUSCULAR | Status: DC
  Administered 2011-11-23 – 2011-11-24 (×2): 3 mL via INTRAVENOUS

## 2011-11-23 MED ORDER — SODIUM CHLORIDE 0.9 % IJ SOLN
3.0000 mL | INTRAMUSCULAR | Status: DC | PRN
  Administered 2011-11-23: 3 mL via INTRAVENOUS

## 2011-11-23 MED ORDER — FUROSEMIDE 40 MG PO TABS
40.0000 mg | ORAL_TABLET | Freq: Two times a day (BID) | ORAL | Status: DC
  Administered 2011-11-23 – 2011-11-24 (×2): 40 mg via ORAL
  Filled 2011-11-23 (×5): qty 1

## 2011-11-23 MED ORDER — SODIUM CHLORIDE 0.9 % IV SOLN
INTRAVENOUS | Status: DC
  Administered 2011-11-24: 10:00:00 via INTRAVENOUS

## 2011-11-23 MED ORDER — SODIUM CHLORIDE 0.9 % IV SOLN: 250.0000 mL | INTRAVENOUS | Status: DC | PRN

## 2011-11-23 MED ORDER — DIAZEPAM 5 MG PO TABS: 5.0000 mg | ORAL_TABLET | ORAL | Status: AC

## 2011-11-23 MED ORDER — CARVEDILOL 3.125 MG PO TABS
3.1250 mg | ORAL_TABLET | Freq: Two times a day (BID) | ORAL | Status: DC
  Administered 2011-11-23 – 2011-11-26 (×6): 3.125 mg via ORAL
  Filled 2011-11-23 (×8): qty 1

## 2011-11-23 NOTE — Progress Notes (Signed)
[facility]  DAILY PROGRESS NOTE  Patient ID: [name], male   DOB: [birth year], 71 y.o.   MRN: [id number]  71 year old African American male presents to the emergency room tonight with chest pain and significant shortness of breath. Initial symptoms began Saturday he had chest pain and shortness of breath it lasted a couple of hours and then resolved he was fairly stable until tonight suddenly he was having chest pain no radiation midsternal sharp feeling and then became significantly short of breath. It is associated with nausea no vomiting positive diaphoresis. Came to the emergency room and was found to have acute pulmonary edema with respiratory failure acutely. Was placed on BiPAP given IV Lasix now improving in his symptoms and CXR. He has ruled in for mild NSTEMI - but his Echo is suggestive of severe Ischemic CM, EF 20-25%.  Subjective:  Feels much better. Breathing well, no CP  Objective:  Temp:  [98 F (36.7 C)-98.7 F (37.1 C)] 98.3 F (36.8 C) (01/01 1200) Pulse Rate:  [64-78] 66  (01/01 1200) Resp:  [14-20] 19  (01/01 1200) BP: (70-141)/(51-84) 122/69 mmHg (01/01 1200) SpO2:  [94 %-99 %] 99 % (01/01 1200) Weight:  [72.2 kg (159 lb 2.8 oz)] 159 lb 2.8 oz (72.2 kg) (01/01 0500) Weight change: -0.9 kg (-1 lb 15.8 oz)  Intake/Output from previous day: 12/31 0701 - 01/01 0700 In: 691 [I.V.:667; IV Piggyback:24] Out: 3705 [Urine:3705]  Intake/Output from this shift: Total I/O In: 24.5 [I.V.:24.5] Out: -   Medications: Current Facility-Administered Medications  Medication Dose Route Frequency Provider Last Rate Last Dose  . 0.9 %  sodium chloride infusion   Intravenous Continuous Leone Brand, NP 10 mL/hr at 11/23/11 1200    . acetaminophen (TYLENOL) tablet 650 mg  650 mg Oral Q4H PRN Leone Brand, NP   650 mg at 11/23/11 0731  . ALPRAZolam Prudy Feeler) tablet 0.25 mg  0.25 mg Oral BID PRN Leone Brand, NP      . amLODipine (NORVASC)  tablet 10 mg  10 mg Oral Daily Marykay Lex   10 mg at 11/23/11 0941  . aspirin chewable tablet 324 mg  324 mg Oral NOW Leone Brand, NP       Or  . aspirin suppository 300 mg  300 mg Rectal NOW Leone Brand, NP      . aspirin EC tablet 81 mg  81 mg Oral Daily Leone Brand, NP   81 mg at 11/23/11 0940  . furosemide (LASIX) 10 MG/ML injection   Was 60mg  q8hr         . furosemide (LASIX) tablet 40 mg  40 mg Oral BID Marykay Lex Starting TODAY      . heparin ADULT infusion 100 units/ml (25000 units/250 ml)  1,250 Units/hr Intravenous Continuous Jessica C Carney, PHARMD 12.5 mL/hr at 11/23/11 1200 12.5 mL/hr at 11/23/11 1200  . heparin bolus via infusion 2,000 Units  2,000 Units Intravenous Once Jessica C Carney, PHARMD      . lisinopril (PRINIVIL,ZESTRIL) tablet 40 mg  40 mg Oral Daily Leone Brand, NP   40 mg at 11/23/11 0940  . metoprolol tartrate (LOPRESSOR) tablet 25 mg  25 mg Oral BID Leone Brand, NP   25 mg at 11/23/11 0940  . morphine 2 MG/ML injection 2 mg  2 mg Intravenous Q2H PRN Leone Brand, NP      . nitroGLYCERIN (NITROSTAT) SL tablet 0.4  mg  0.4 mg Sublingual Q5 min PRN Leone Brand, NP      . nitroGLYCERIN 0.2 mg/mL in dextrose 5 % infusion  20 mcg/min Intravenous Once Vida Roller, MD 9 mL/hr at 11/22/11 0700 30 mcg/min at 11/22/11 0700  . nitroGLYCERIN 0.2 mg/mL in dextrose 5 % infusion  40 mcg/min Intravenous Titrated Marykay Lex 9 mL/hr at 11/23/11 1200 30 mcg/min at 11/23/11 1200  . ondansetron (ZOFRAN) injection 4 mg  4 mg Intravenous Q6H PRN Leone Brand, NP      . ondansetron [facility]) injection 4 mg  4 mg Intravenous Q6H PRN Leone Brand, NP   4 mg at 11/22/11 1610  . pantoprazole (PROTONIX) EC tablet 40 mg  40 mg Oral Q1200 Leone Brand, NP   40 mg at 11/23/11 1130  . potassium chloride SA (K-DUR,KLOR-CON) CR tablet 20 mEq  20 mEq Oral BID Leone Brand, NP   20 mEq at 11/23/11 0940  . rosuvastatin (CRESTOR) tablet 10 mg  10 mg Oral QHS  Leone Brand, NP   10 mg at 11/22/11 2152  . tiZANidine (ZANAFLEX) tablet 4 mg  4 mg Oral Q8H PRN Leone Brand, NP      . Travoprost (BAK Free) (TRAVATAN) 0.004 % ophthalmic solution SOLN 1 drop  1 drop Both Eyes QHS Leone Brand, NP   1 drop at 11/22/11 2152    Physical Exam: General appearance: alert, cooperative, appears stated age and no distress Neck: no adenopathy, no carotid bruit, no JVD, supple, symmetrical, trachea midline and thyroid not enlarged, symmetric, no tenderness/mass/nodules Lungs: clear to auscultation bilaterally and non-labored with mimimal basal rales Heart: regular rate and rhythm, S1, S2 normal, S4 present, no click, no rub and Soft S4. No murmur Abdomen: soft, non-tender; bowel sounds normal; no masses,  no organomegaly Extremities: extremities normal, atraumatic, no cyanosis or edema Pulses: 2+ and symmetric Skin: Skin color, texture, turgor normal. No rashes or lesions Neurologic: Mental status: Alert, oriented, thought content appropriate, CN II-XII grossly intact.  Lab Results: Results for orders placed during the hospital encounter of 11/22/11 (from the past 48 hour(s))      Component Value Range Comment   Hemoglobin A1C 6.0 (*) <5.7 (%)    Mean Plasma Glucose 126 (*) <117 (mg/dL)   CARDIAC PANEL(CRET KIN+CKTOT+MB+TROPI)     Status: Abnormal   Collection Time   11/22/11  9:46 AM      Component Value Range Comment   Total CK 193  7 - 232 (U/L)    CK, MB 10.6 (*) 0.3 - 4.0 (ng/mL)    Troponin I 0.55 (*) <0.30 (ng/mL)    Relative Index 5.5 (*) 0.0 - 2.5    CARDIAC PANEL(CRET KIN+CKTOT+MB+TROPI)     Status: Abnormal   Collection Time   11/22/11  5:54 PM      Component Value Range Comment   Total CK 172  7 - 232 (U/L)    CK, MB 8.8 (*) 0.3 - 4.0 (ng/mL) CRITICAL VALUE NOTED   Troponin I 0.71 (*) <0.30 (ng/mL)    Relative Index 5.1 (*) 0.0 - 2.5    CARDIAC PANEL(CRET KIN+CKTOT+MB+TROPI)     Status: Abnormal   Collection Time   11/23/11  1:50 AM       Component Value Range Comment   Total CK 153  7 - 232 (U/L)    CK, MB 5.9 (*) 0.3 - 4.0 (ng/mL)    Troponin I 0.36 (*) <  0.30 (ng/mL)    Relative Index 3.9 (*) 0.0 - 2.5    HEPARIN LEVEL (UNFRACTIONATED)     Status: Normal   Collection Time   11/23/11  1:51 AM      Component Value Range Comment   Heparin Unfractionated 0.42  0.30 - 0.70 (IU/mL)   BASIC METABOLIC PANEL     Status: Abnormal   Collection Time   11/23/11  1:51 AM      Component Value Range Comment   Sodium 138  135 - 145 (mEq/L)    Potassium 3.8  3.5 - 5.1 (mEq/L)    Chloride 99  96 - 112 (mEq/L)    CO2 28  19 - 32 (mEq/L)    Glucose, Bld 134 (*) 70 - 99 (mg/dL)    BUN 17  6 - 23 (mg/dL)    Creatinine, Ser 1.61 (*) 0.50 - 1.35 (mg/dL)    Calcium 9.2  8.4 - 10.5 (mg/dL)    GFR calc non Af Amer 37 (*) >90 (mL/min)    GFR calc Af Amer 43 (*) >90 (mL/min)   CBC     Status: Normal   Collection Time   11/23/11  1:51 AM      Component Value Range Comment   WBC 8.4  4.0 - 10.5 (K/uL)    RBC 4.93  4.22 - 5.81 (MIL/uL)    Hemoglobin 14.2  13.0 - 17.0 (g/dL)    HCT 09.6  04.5 - 40.9 (%)    MCV 84.4  78.0 - 100.0 (fL)    MCH 28.8  26.0 - 34.0 (pg)    MCHC 34.1  30.0 - 36.0 (g/dL)    RDW 81.1  91.4 - 78.2 (%)    Platelets 176  150 - 400 (K/uL)   LIPID PANEL     Status: Normal   Collection Time   11/23/11  1:51 AM      Component Value Range Comment   Cholesterol 136  0 - 200 (mg/dL)    Triglycerides 43  <956 (mg/dL)    HDL 60  >21 (mg/dL)    Total CHOL/HDL Ratio 2.3      VLDL 9  0 - 40 (mg/dL)    LDL Cholesterol 67  0 - 99 (mg/dL)   PRO B NATRIURETIC PEPTIDE     Status: Abnormal   Collection Time   11/23/11  2:45 AM      Component Value Range Comment   Pro B Natriuretic peptide (BNP) 3579.0 (*) 0 - 125 (pg/mL)    Imaging: CXR yesterday much improved  Assessment: Principal Problem:  *Respiratory failure, acute Active Problems:  Non-STEMI  Accelerated hypertension  Acute CHF (congestive heart failure) - Combined  Systolic / Diastolic  CAD (coronary artery disease) 80% stenosis diag of the LAD, 30% in OM2 branch of LCX in 2009  Ventricular ectopy Hx of PVCs Creatinine level rising with diuresis  Key new complaints: Feeling much better now, off BiPAP, Breathing better & no more CP  Key examination changes: bi-basal rales ~1/3 up, c/w Xray  Key new findings / data: CXR personally reviewed - #2 looks much better, Cr ~1.7 with aggressive diuresis   Mr. Zuno's Sx & findings of acute pulmonary edema are very consistent with Acute Coronary Syndrome -- Saturday as [place] - & last PM/early AM today may well be NSTEMI. ECG is (as described) very erratic - looks like frequent PVCs, but no clear evidence of ST E - will recheck here now that he is  more stable. I suspect that his cardiac markers will be positive. Given presentation with CHF (likley acute diastolic +/- systolic - Echo done, will need to read) & Anginal Sx, he is relatively High TIMI Risk.   PLAN:  Will transfer to TCU today.  Will convert to PO lasix as Cr is increased & good UOP, hold parameters for PO meds & titration parameters for NTG gtt.  Cycle cardiac bio-markers, will not be aggressive w/ BB given acute decompensated HF (would prefer converting to Carvedilol) - use IV nitrate until cath Will schedule catheterization tomorrow (provided Cr level stays stable, but likely not today as he is just now off BiPap, but still has notable pulm edema on CXR. As his BPs have been much improved, will hold CCB dose today & convert Metoprolol to Carvedilol. Continue ACE-I Will need to consider LifeVest on discharge as his is high risk for VT/VF ? Of LV Thrombus - would not cross AoValve (& no need for LV Gram)-- will order limited Echo with definity contrast   CARDIAC CATHETERIZATION CONSENT: Performing MD:  Michaela Corner, M.D. (of Dr. Rennis Golden)  Procedure:   Left (and possibly Right) Heart Catheterization with Coronary Angiograhy Possible Percutaneous  Coronary Intervention (Balloon, Stent)  The procedure with Risks/Benefits/Alternatives and Indications was reviewed with the patient and family.  All questions were answered.    Risks / Complications include, but not limited to: Death, MI, CVA/TIA, VF/VT (with defibrillation), Bradycardia (need for temporary pacer placement), contrast induced nephropathy, bleeding / bruising / hematoma / pseudoaneurysm, vascular or coronary injury (with possible emergent CT or Vascular Surgery), adverse medication reactions, infection.    The patient (and family) voice understanding and agree to proceed.     Marykay Lex, M.D., M.S. [facility] [address] [place], [place]  [address]  [phone]  11/23/2011 1:27 PM      Time Spent Directly with Patient:  30 minutes  Length of Stay:  LOS: 1 day   HARDING,DAVID W Attending Interventional Cardiologist [facility] 11/23/2011, 12:41 PM

## 2011-11-23 NOTE — Progress Notes (Signed)
ANTICOAGULATION CONSULT NOTE - Follow Up Consult  Pharmacy Consult for heparin Indication: chest pain / STeMI No Known Allergies  Patient Measurements: Height: 5\' 3"  (160 cm) Weight: 161 lb 2.5 oz (73.1 kg) IBW/kg (Calculated) : 56.9  Adjusted Body Weight:   Vital Signs: Temp: 98 F (36.7 C) (01/01 0410) Temp src: Oral (01/01 0410) BP: 133/74 mmHg (12/31 2300) Pulse Rate: 78  (12/31 2300)  Labs:  Basename 11/23/11 0151 11/23/11 0150 11/22/11 1754 11/22/11 0946 11/22/11 0936 11/22/11 0935 11/22/11 0105 11/22/11 0046  HGB 14.2 -- -- -- -- 15.1 -- --  HCT 41.6 -- -- -- -- 44.1 51.0 --  PLT 176 -- -- -- -- 174 -- 182  APTT -- -- -- -- -- -- -- 33  LABPROT -- -- -- -- -- -- -- 15.8*  INR -- -- -- -- -- -- -- 1.23  HEPARINUNFRC 0.42 -- 0.15* -- 0.33 -- -- --  CREATININE 1.74* -- -- -- -- 1.44* 1.40* --  CKTOTAL -- 153 172 193 -- -- -- --  CKMB -- 5.9* 8.8* 10.6* -- -- -- --  TROPONINI -- 0.36* 0.71* 0.55* -- -- -- --   Estimated Creatinine Clearance: 34.4 ml/min (by C-G formula based on Cr of 1.74).   Medications:  Infusions:    . sodium chloride 10 mL/hr at 11/22/11 0800  . heparin 12.5 mL/hr (11/23/11 0400)  . nitroGLYCERIN 40 mcg/min (11/23/11 0400)  . DISCONTD: heparin 10 mL/hr (11/22/11 1900)    Assessment: 70 yom on IV heparin for STeMI. Heparin is currently at goal without complications. Will continue without rate change. Goal of Therapy:  Heparin level 0.3-0.7 units/ml   Plan:  Continue heparin at 1250 units/hours Daily CBC/HL  Severiano Gilbert 11/23/2011,4:59 AM

## 2011-11-24 ENCOUNTER — Encounter (HOSPITAL_COMMUNITY)
Admission: EM | Disposition: A | Discharge status: Home / Self Care | Payer: Self-pay | Source: Home / Self Care | Attending: Cardiology

## 2011-11-24 ENCOUNTER — Other Ambulatory Visit: Payer: Self-pay

## 2011-11-24 LAB — HEPARIN LEVEL (UNFRACTIONATED): Heparin Unfractionated: 0.27 IU/mL — ABNORMAL LOW (ref 0.30–0.70)

## 2011-11-24 LAB — BASIC METABOLIC PANEL
BUN: 23 mg/dL (ref 6–23)
Creatinine, Ser: 1.96 mg/dL — ABNORMAL HIGH (ref 0.50–1.35)
GFR calc Af Amer: 38 mL/min — ABNORMAL LOW (ref >90)
GFR calc non Af Amer: 32 mL/min — ABNORMAL LOW (ref >90)
Potassium: 3.7 mEq/L (ref 3.5–5.1)

## 2011-11-24 SURGERY — LEFT HEART CATHETERIZATION WITH CORONARY ANGIOGRAM
Anesthesia: LOCAL

## 2011-11-24 MED ORDER — FUROSEMIDE 40 MG PO TABS
40.0000 mg | ORAL_TABLET | Freq: Two times a day (BID) | ORAL | Status: DC
  Filled 2011-11-24 (×2): qty 1

## 2011-11-24 MED ORDER — ASPIRIN 81 MG PO CHEW: 324.0000 mg | CHEWABLE_TABLET | ORAL | Status: DC

## 2011-11-24 MED ORDER — SODIUM CHLORIDE 0.9 % IJ SOLN: 3.0000 mL | INTRAMUSCULAR | Status: DC | PRN

## 2011-11-24 MED ORDER — ASPIRIN 81 MG PO CHEW: 324.0000 mg | CHEWABLE_TABLET | Freq: Every day | ORAL | Status: DC

## 2011-11-24 MED ORDER — SODIUM CHLORIDE 0.9 % IJ SOLN
3.0000 mL | Freq: Two times a day (BID) | INTRAMUSCULAR | Status: DC
  Administered 2011-11-24: 3 mL via INTRAVENOUS

## 2011-11-24 MED ORDER — FUROSEMIDE 40 MG PO TABS
40.0000 mg | ORAL_TABLET | Freq: Two times a day (BID) | ORAL | Status: DC
  Filled 2011-11-24: qty 1

## 2011-11-24 MED ORDER — DIAZEPAM 5 MG PO TABS
5.0000 mg | ORAL_TABLET | Freq: Once | ORAL | Status: AC
  Administered 2011-11-25: 5 mg via ORAL
  Filled 2011-11-24: qty 1

## 2011-11-24 MED ORDER — NITROGLYCERIN IN D5W 200-5 MCG/ML-% IV SOLN
10.0000 ug/min | INTRAVENOUS | Status: DC
  Administered 2011-11-24: 10 ug/min via INTRAVENOUS
  Filled 2011-11-24: qty 250

## 2011-11-24 MED ORDER — SODIUM CHLORIDE 0.9 % IV SOLN: INTRAVENOUS | Status: DC

## 2011-11-24 MED ORDER — LISINOPRIL 40 MG PO TABS: 40.0000 mg | ORAL_TABLET | Freq: Every day | ORAL | Status: DC

## 2011-11-24 MED ORDER — ASPIRIN EC 81 MG PO TBEC
81.0000 mg | DELAYED_RELEASE_TABLET | Freq: Every day | ORAL | Status: DC
  Administered 2011-11-26 – 2011-11-27 (×2): 81 mg via ORAL
  Filled 2011-11-24 (×2): qty 1

## 2011-11-24 MED ORDER — HEPARIN SOD (PORCINE) IN D5W 100 UNIT/ML IV SOLN
1550.0000 [IU]/h | INTRAVENOUS | Status: DC
  Administered 2011-11-24: 1350 [IU]/h via INTRAVENOUS
  Filled 2011-11-24 (×2): qty 250

## 2011-11-24 MED ORDER — SODIUM CHLORIDE 0.9 % IV SOLN: 250.0000 mL | INTRAVENOUS | Status: DC | PRN

## 2011-11-24 MED ORDER — ASPIRIN 81 MG PO CHEW
324.0000 mg | CHEWABLE_TABLET | Freq: Once | ORAL | Status: AC
  Administered 2011-11-25: 324 mg via ORAL
  Filled 2011-11-24: qty 4

## 2011-11-24 NOTE — Progress Notes (Signed)
*  PRELIMINARY RESULTS* Echocardiogram 2D Echocardiogram has been performed.  Glean Salen [facility] 11/24/2011, 11:29 AM

## 2011-11-24 NOTE — Progress Notes (Signed)
[facility]  Subjective: Intermittent CP, Sharp, 4/10 lasts < one minute.  Objective: Vital signs in last 24 hours: Temp:  [98.1 F (36.7 C)-98.6 F (37 C)] 98.1 F (36.7 C) (01/02 0429) Pulse Rate:  [66-90] 71  (01/02 0900) Resp:  [14-22] 14  (01/02 0900) BP: (74-132)/(49-80) 108/64 mmHg (01/02 0900) SpO2:  [95 %-100 %] 99 % (01/02 0900) Last BM Date: 11/21/11  Intake/Output from previous day: 01/01 0701 - 01/02 0700 In: 465.5 [I.V.:465.5] Out: 1235 [Urine:1235] Intake/Output this shift: Total I/O In: 150 [I.V.:150] Out: -   Medications Current Facility-Administered Medications  Medication Dose Route Frequency Provider Last Rate Last Dose  . 0.9 %  sodium chloride infusion   Intravenous Continuous Leone Brand, NP 10 mL/hr at 11/23/11 1800    . 0.9 %  sodium chloride infusion  250 mL Intravenous PRN Marykay Lex      . 0.9 %  sodium chloride infusion   Intravenous Continuous Marykay Lex 75 mL/hr at 11/24/11 4098    . acetaminophen (TYLENOL) tablet 650 mg  650 mg Oral Q4H PRN Leone Brand, NP   650 mg at 11/23/11 1848  . ALPRAZolam Prudy Feeler) tablet 0.25 mg  0.25 mg Oral BID PRN Leone Brand, NP      . amLODipine (NORVASC) tablet 10 mg  10 mg Oral Daily Marykay Lex   10 mg at 11/24/11 0934  . aspirin EC tablet 81 mg  81 mg Oral Daily Leone Brand, NP   81 mg at 11/24/11 0934  . carvedilol (COREG) tablet 3.125 mg  3.125 mg Oral BID WC Piedad Climes Harding   3.125 mg at 11/24/11 0751  . diazepam (VALIUM) tablet 5 mg  5 mg Oral On Call Marykay Lex      . furosemide (LASIX) tablet 40 mg  40 mg Oral BID Marykay Lex   40 mg at 11/24/11 0751  . heparin ADULT infusion 100 units/ml (25000 units/250 ml)  1,350 Units/hr Intravenous Continuous Gala Lewandowsky Millen, PHARMD 13.5 mL/hr at 11/24/11 0900 13.5 mL/hr at 11/24/11 0900  . lisinopril (PRINIVIL,ZESTRIL) tablet 40 mg  40 mg Oral Daily Leone Brand, NP   40 mg at 11/24/11 0934  .  morphine 2 MG/ML injection 2 mg  2 mg Intravenous Q2H PRN Leone Brand, NP      . nitroGLYCERIN (NITROSTAT) SL tablet 0.4 mg  0.4 mg Sublingual Q5 min PRN Leone Brand, NP      . nitroGLYCERIN 0.2 mg/mL in dextrose 5 % infusion  20 mcg/min Intravenous Once Vida Roller, MD 9 mL/hr at 11/22/11 0700 30 mcg/min at 11/22/11 0700  . nitroGLYCERIN 0.2 mg/mL in dextrose 5 % infusion  40 mcg/min Intravenous Titrated Marykay Lex 6 mL/hr at 11/24/11 0900 20 mcg/min at 11/24/11 0900  . ondansetron (ZOFRAN) injection 4 mg  4 mg Intravenous Q6H PRN Leone Brand, NP      . ondansetron [facility]) injection 4 mg  4 mg Intravenous Q6H PRN Leone Brand, NP   4 mg at 11/22/11 1191  . pantoprazole (PROTONIX) EC tablet 40 mg  40 mg Oral Q1200 Leone Brand, NP   40 mg at 11/23/11 1130  . potassium chloride SA (K-DUR,KLOR-CON) CR tablet 20 mEq  20 mEq Oral BID Leone Brand, NP   20 mEq at 11/24/11 0934  . rosuvastatin (CRESTOR) tablet 10 mg  10 mg Oral QHS Leone Brand,  NP   10 mg at 11/23/11 2200  . sodium chloride 0.9 % injection 3 mL  3 mL Intravenous Q12H Marykay Lex   3 mL at 11/24/11 0934  . sodium chloride 0.9 % injection 3 mL  3 mL Intravenous PRN Marykay Lex   3 mL at 11/23/11 1415  . tiZANidine (ZANAFLEX) tablet 4 mg  4 mg Oral Q8H PRN Leone Brand, NP      . Travoprost (BAK Free) (TRAVATAN) 0.004 % ophthalmic solution SOLN 1 drop  1 drop Both Eyes QHS Leone Brand, NP   1 drop at 11/23/11 2200  . DISCONTD: furosemide (LASIX) injection 60 mg  60 mg Intravenous Q8H Leone Brand, NP   60 mg at 11/23/11 0534  . DISCONTD: heparin ADULT infusion 100 units/ml (25000 units/250 ml)  1,250 Units/hr Intravenous Continuous Jessica C Carney, PHARMD 12.5 mL/hr at 11/24/11 0800 12.5 mL/hr at 11/24/11 0800  . DISCONTD: metoprolol tartrate (LOPRESSOR) tablet 25 mg  25 mg Oral BID Leone Brand, NP   25 mg at 11/23/11 0940    PE: General appearance: alert, cooperative and no distress Lungs:  clear to auscultation bilaterally Heart: regular rate and rhythm and occassional ectopic beat.  No MM Extremities: No LEE Pulses: 2+ and symmetric  Lab Results:   Basename 11/23/11 0151 11/22/11 0935 11/22/11 0105 11/22/11 0046  WBC 8.4 8.6 -- 6.7  HGB 14.2 15.1 17.3* --  HCT 41.6 44.1 51.0 --  PLT 176 174 -- 182   BMET  Basename 11/24/11 0515 11/23/11 0151 11/22/11 0935  NA 135 138 140  K 3.7 3.8 4.2  CL 101 99 101  CO2 25 28 27   GLUCOSE 131* 134* 128*  BUN 23 17 14   CREATININE 1.96* 1.74* 1.44*  CALCIUM 9.1 9.2 9.2   PT/INR  Basename 11/22/11 0046  LABPROT 15.8*  INR 1.23   Cholesterol  Basename 11/23/11 0151  CHOL 136    Assessment/Plan  Principal Problem:  *Respiratory failure, acute Active Problems:  Non-STEMI  CAD (coronary artery disease) 80% stenosis diag of the LAD, 30% in OM2 branch of LCX in 2009  Accelerated hypertension  Ventricular ectopy Hx of PVCs  Acute CHF (congestive heart failure) - Combined Systolic / Diastolic  Plan:  Troponin peaked at 0.71.  Pt to have left heart cath today(Cancelled).  Cr worsened today to 1.96.  Hold PM Lasix dose.  BP/HR stable and controlled.  Will hydrate gently at 28ml/hr starting at 0400hrs on 11/25/11.    Transferring to Telemetry.  Cath postponed until 11/25/11.   LOS: 2 days    HAGER,BRYAN W 11/24/2011 10:00 AM   Agree with note written by Jones Skene PAC  Pt well known to our service. Pt of Dr. Wonda Cheng. H/O non critical CAD by cath 2009 with recent EF by 2D last month of 45%. Admitted Sunday night with CP and SOB/APE. Enz peaked at trop of .7. On IV hep/ntg. Pt was diuresed and SCr increased from 1.44 to 1.96. (I/O neg 3700cc). EKG shows new anterolateral TWI with PVCs. 2D shows EF 20-25% which is significatly lower then his most recent EF by 2D in our office 3 weeks ago. Exam benign. Lungs clear. Needs cath but with increasing SCr will put off until tomorrow. Hold PM lasix and ACE-I, gently hydrate. Plan recheck  labs tomorrow and hopefully cath. Worry about prox LAD disease.  Runell Gess 11/24/2011 12:01 PM

## 2011-11-24 NOTE — Progress Notes (Signed)
ANTICOAGULATION CONSULT NOTE - Follow Up Consult  Pharmacy Consult for Heparin Indication: Chest Pain/STEMI  No Known Allergies  Patient Measurements: Height: 5\' 3"  (160 cm) Weight: 159 lb 2.8 oz (72.2 kg) IBW/kg (Calculated) : 56.9  Heparin Dosing Weight: 71.7 kg  Vital Signs: Temp: 98.1 F (36.7 C) (01/02 0429) Temp src: Oral (01/02 0429) BP: 104/60 mmHg (01/02 0800) Pulse Rate: 73  (01/02 0800)  Labs:  Alvira Philips 11/24/11 0515 11/24/11 0413 11/23/11 0151 11/23/11 0150 11/22/11 1754 11/22/11 0946 11/22/11 0935 11/22/11 0105 11/22/11 0046  HGB -- -- 14.2 -- -- -- 15.1 -- --  HCT -- -- 41.6 -- -- -- 44.1 51.0 --  PLT -- -- 176 -- -- -- 174 -- 182  APTT -- -- -- -- -- -- -- -- 33  LABPROT -- -- -- -- -- -- -- -- 15.8*  INR -- -- -- -- -- -- -- -- 1.23  HEPARINUNFRC -- 0.27* 0.42 -- 0.15* -- -- -- --  CREATININE 1.96* -- 1.74* -- -- -- 1.44* -- --  CKTOTAL -- -- -- 153 172 193 -- -- --  CKMB -- -- -- 5.9* 8.8* 10.6* -- -- --  TROPONINI -- -- -- 0.36* 0.71* 0.55* -- -- --   Estimated Creatinine Clearance: 30.4 ml/min (by C-G formula based on Cr of 1.96).   Medications:  Infusions:     . sodium chloride 10 mL/hr at 11/23/11 1800  . sodium chloride    . heparin 12.5 mL/hr (11/24/11 0800)  . nitroGLYCERIN 20 mcg/min (11/24/11 0800)    Assessment: Heparin level is subtherapeutic on 1000 units/hr.  No bleeding noted per chart.  71 yo male with history of CHF/HTN/GERD who presents with SOB. He c/o left-sided chest pain and ECG is chaotic but found to have atrial fibrillation. He has a history of CAD with multi-vessel stenosis.   Pharmacy Problem List/Assessment:  Anticoagulation: CP/STEMI- on IV heparin, Heparin level therapeutic at 0.27 on 1250units/hr ( no bleeding reported), h/h wnl, plts 176 Infectious disease: wbc 8.4, Afebrile, no antibiotics Pulm: cxr much improved, off bipap Card: CP/nonStemi- Trop 0.55-->0.36, known CAD and Afib, BP 118/61, HR 78, on IV heparin  and IV NTG, norvasc, asa, IV lasix, bb, statin. Cath 1/2 planned if creat OK, ECHO-EF 20-25%-on acei (watch SCr closely) FEN/GI/Endo: GLU 131-151, no insulin on board. F/U CBGs-may need insulin coverage.  Renal: SCr 1.96 (incr), estCrCl~30.4, MD aware, UOP~0.7cc/kg/hr Best practices: IV heparin, PPI po  Goal of Therapy:  Heparin level 0.3-0.7 units/ml   Plan:  1. Increase heparin gtt to 1350 units/hr (13.5 ml/hr) 3. Continue daily heparin level and CBC.  Fayne Norrie 11/24/2011,8:13 AM

## 2011-11-25 ENCOUNTER — Encounter (HOSPITAL_COMMUNITY)
Admission: EM | Disposition: A | Discharge status: Home / Self Care | Payer: Self-pay | Source: Home / Self Care | Attending: Cardiology

## 2011-11-25 DIAGNOSIS — I429 Cardiomyopathy, unspecified: Secondary | ICD-10-CM | POA: Diagnosis present

## 2011-11-25 DIAGNOSIS — N289 Disorder of kidney and ureter, unspecified: Secondary | ICD-10-CM | POA: Diagnosis present

## 2011-11-25 HISTORY — PX: LEFT HEART CATHETERIZATION WITH CORONARY ANGIOGRAM: SHX5451

## 2011-11-25 LAB — BASIC METABOLIC PANEL
Chloride: 106 mEq/L (ref 96–112)
GFR calc Af Amer: 43 mL/min — ABNORMAL LOW (ref >90)
GFR calc non Af Amer: 37 mL/min — ABNORMAL LOW (ref >90)
Potassium: 4.4 mEq/L (ref 3.5–5.1)
Sodium: 141 mEq/L (ref 135–145)

## 2011-11-25 LAB — CREATININE, SERUM
Creatinine, Ser: 1.6 mg/dL — ABNORMAL HIGH (ref 0.50–1.35)
GFR calc non Af Amer: 41 mL/min — ABNORMAL LOW (ref >90)

## 2011-11-25 LAB — HEPARIN LEVEL (UNFRACTIONATED): Heparin Unfractionated: 0.19 IU/mL — ABNORMAL LOW (ref 0.30–0.70)

## 2011-11-25 LAB — POCT ACTIVATED CLOTTING TIME: Activated Clotting Time: 166 seconds

## 2011-11-25 LAB — CBC
Hemoglobin: 13.8 g/dL (ref 13.0–17.0)
Platelets: 125 10*3/uL — ABNORMAL LOW (ref 150–400)
RBC: 4.78 MIL/uL (ref 4.22–5.81)

## 2011-11-25 SURGERY — LEFT HEART CATHETERIZATION WITH CORONARY ANGIOGRAM
Anesthesia: LOCAL

## 2011-11-25 MED ORDER — ENOXAPARIN SODIUM 30 MG/0.3ML ~~LOC~~ SOLN
30.0000 mg | SUBCUTANEOUS | Status: DC
  Administered 2011-11-26 – 2011-11-27 (×2): 30 mg via SUBCUTANEOUS
  Filled 2011-11-25 (×3): qty 0.3

## 2011-11-25 MED ORDER — ONDANSETRON HCL 4 MG/2ML IJ SOLN: 4.0000 mg | Freq: Four times a day (QID) | INTRAMUSCULAR | Status: DC | PRN

## 2011-11-25 MED ORDER — FENTANYL CITRATE 0.05 MG/ML IJ SOLN
INTRAMUSCULAR | Status: AC
  Filled 2011-11-25: qty 2

## 2011-11-25 MED ORDER — LIDOCAINE HCL (PF) 1 % IJ SOLN
INTRAMUSCULAR | Status: AC
  Filled 2011-11-25: qty 30

## 2011-11-25 MED ORDER — SODIUM CHLORIDE 0.9 % IJ SOLN
3.0000 mL | INTRAMUSCULAR | Status: DC | PRN
  Administered 2011-11-25: 3 mL via INTRAVENOUS

## 2011-11-25 MED ORDER — NITROGLYCERIN 0.2 MG/ML ON CALL CATH LAB
INTRAVENOUS | Status: AC
  Filled 2011-11-25: qty 1

## 2011-11-25 MED ORDER — SODIUM CHLORIDE 0.9 % IJ SOLN: 3.0000 mL | INTRAMUSCULAR | Status: DC | PRN

## 2011-11-25 MED ORDER — HEPARIN BOLUS VIA INFUSION
2000.0000 [IU] | Freq: Once | INTRAVENOUS | Status: AC
  Administered 2011-11-25: 2000 [IU] via INTRAVENOUS
  Filled 2011-11-25: qty 2000

## 2011-11-25 MED ORDER — SODIUM CHLORIDE 0.9 % IV SOLN: 250.0000 mL | INTRAVENOUS | Status: DC | PRN

## 2011-11-25 MED ORDER — SODIUM CHLORIDE 0.9 % IJ SOLN: 3.0000 mL | Freq: Two times a day (BID) | INTRAMUSCULAR | Status: DC

## 2011-11-25 MED ORDER — HEPARIN (PORCINE) IN NACL 2-0.9 UNIT/ML-% IJ SOLN
INTRAMUSCULAR | Status: AC
  Filled 2011-11-25: qty 1000

## 2011-11-25 MED ORDER — SODIUM CHLORIDE 0.9 % IV SOLN
1.0000 mL/kg/h | INTRAVENOUS | Status: AC
  Administered 2011-11-25: 1 mL/kg/h via INTRAVENOUS

## 2011-11-25 MED ORDER — SODIUM CHLORIDE 0.9 % IJ SOLN
3.0000 mL | Freq: Two times a day (BID) | INTRAMUSCULAR | Status: DC
  Administered 2011-11-26 – 2011-11-27 (×3): 3 mL via INTRAVENOUS

## 2011-11-25 MED ORDER — MIDAZOLAM HCL 2 MG/2ML IJ SOLN
INTRAMUSCULAR | Status: AC
  Filled 2011-11-25: qty 2

## 2011-11-25 MED FILL — Perflutren Lipid Microsphere IV Susp 6.52 MG/ML: INTRAVENOUS | Qty: 2 | Status: AC

## 2011-11-25 NOTE — Progress Notes (Addendum)
Subjective:  No SOB this am.  Objective:  Vital Signs in the last 24 hours: Temp:  [98 F (36.7 C)-98.8 F (37.1 C)] 98.4 F (36.9 C) (01/03 0500) Pulse Rate:  [69-82] 75  (01/03 0500) Resp:  [13-20] 18  (01/03 0500) BP: (105-132)/(53-80) 118/60 mmHg (01/03 0500) SpO2:  [96 %-99 %] 96 % (01/03 0500) Weight:  [70.308 kg (155 lb)-72.53 kg (159 lb 14.4 oz)] 159 lb 14.4 oz (72.53 kg) (01/03 0500)  Intake/Output from previous day:  Intake/Output Summary (Last 24 hours) at 11/25/11 0852 Last data filed at 11/25/11 0700  Gross per 24 hour  Intake 941.57 ml  Output    500 ml  Net 441.57 ml    Physical Exam: General appearance: alert, cooperative and no distress Lungs: clear to auscultation bilaterally Heart: regular rate and rhythm   Rate: 70  Rhythm: normal sinus rhythm and PVCs  Lab Results:  Basename 11/23/11 0151 11/22/11 0935  WBC 8.4 8.6  HGB 14.2 15.1  PLT 176 174    Basename 11/25/11 0523 11/24/11 0515  NA 141 135  K 4.4 3.7  CL 106 101  CO2 26 25  GLUCOSE 126* 131*  BUN 23 23  CREATININE 1.76* 1.96*    Basename 11/23/11 0150 11/22/11 1754  TROPONINI 0.36* 0.71*   Hepatic Function Panel No results found for this basename: PROT,ALBUMIN,AST,ALT,ALKPHOS,BILITOT,BILIDIR,IBILI in the last 72 hours  Basename 11/23/11 0151  CHOL 136   No results found for this basename: INR in the last 72 hours  Imaging: No results found.  Cardiac Studies:  Assessment/Plan:   Principal Problem:  *Respiratory failure, acute  Active Problems:  Non-STEMI, Troponin pk 0.71  CAD (coronary artery disease) 80% stenosis diag of the LAD, 30% in OM2 branch of LCX in 2009  Accelerated hypertension  Acute CHF (congestive heart failure) - Combined Systolic / Diastolic  Acute renal insufficiency, (Cr 1.3 12/12 as an OP, on ACE then)  Cardiomyopathy, new LVD, EF was 45-50 by 2D 10/25/11, now 20-25%  Ventricular ectopy Hx of PVCs   Plan- Discussed with Dr Herbie Baltimore, hold ACE  and Lasix, cath with cors only.   Corine Shelter PA-C 11/25/2011, 8:52 AM  Note- Pt admits that he ran out of Lasix 3-4 days prior to adm   I have seen and examined the patient along with Corine Shelter, PA.  I have reviewed the chart, notes and new data.  I agree with Lukes's note.  Key new complaints: None, breathing fine, no CP Key examination changes: no rales Key new findings / data: Cr improved today 1.76  PLAN: Continue to hydrate through the morning.  Hold ACE-I (until renal function improved) & Lasix today. Will plan Coronary Angiography this Afternoon to allow for adequate hydration - no need for LV gram.  Depending upon extent of disease, may consider staged PCI.  Please see my note from 11/23/11 for full cath consent data.  Marykay Lex, M.D., M.S. [facility] [address] [place], [place]  [address]  [phone]  11/25/2011 11:36 AM

## 2011-11-25 NOTE — Progress Notes (Signed)
ANTICOAGULATION CONSULT NOTE - Follow Up Consult  Pharmacy Consult for Heparin Indication: Chest Pain/STEMI  No Known Allergies  Patient Measurements: Height: 5\' 3"  (160 cm) Weight: 155 lb (70.308 kg) IBW/kg (Calculated) : 56.9  Heparin Dosing Weight: 71.7 kg  Vital Signs: Temp: 98.8 F (37.1 C) (01/02 2100) Temp src: Oral (01/02 2100) BP: 124/72 mmHg (01/02 2100) Pulse Rate: 73  (01/02 2100)  Labs:  Alvira Philips 11/25/11 5621 11/24/11 0515 11/24/11 0413 11/23/11 0151 11/23/11 0150 11/22/11 1754 11/22/11 0946 11/22/11 0935  HGB -- -- -- 14.2 -- -- -- 15.1  HCT -- -- -- 41.6 -- -- -- 44.1  PLT -- -- -- 176 -- -- -- 174  APTT -- -- -- -- -- -- -- --  LABPROT -- -- -- -- -- -- -- --  INR -- -- -- -- -- -- -- --  HEPARINUNFRC 0.19* -- 0.27* 0.42 -- -- -- --  CREATININE -- 1.96* -- 1.74* -- -- -- 1.44*  CKTOTAL -- -- -- -- 153 172 193 --  CKMB -- -- -- -- 5.9* 8.8* 10.6* --  TROPONINI -- -- -- -- 0.36* 0.71* 0.55* --   Estimated Creatinine Clearance: 30 ml/min (by C-G formula based on Cr of 1.96).  Assessment:  71 yo male with ACS for Heparin  Goal of Therapy:  Heparin level 0.3-0.7 units/ml   Plan:  Heparin 2000 units IV bolus, then increase 1550 units/hr F/U after cath  Eddie Candle 11/25/2011,6:12 AM

## 2011-11-25 NOTE — Plan of Care (Signed)
Problem: Phase III Progression Outcomes Goal: Vascular site scale level 0 - I Vascular Site Scale Level 0: No bruising/bleeding/hematoma Level I (Mild): Bruising/Ecchymosis, minimal bleeding/ooozing, palpable hematoma < 3 cm Level II (Moderate): Bleeding not affecting hemodynamic parameters, pseudoaneurysm, palpable hematoma > 3 cm  Outcome: Progressing Level 0

## 2011-11-25 NOTE — Brief Op Note (Signed)
11/22/2011 - 11/25/2011  2:47 PM  PATIENT:  [name]  71 y.o. male with a history of Diagonal disease treated medically for years who presented on 11/22/2011 with acute respiratory failure secondary to acute on chronic combined systolic/diastolic CHF with mildly positive troponins c/w non-ST elevation MI as either due to hypertensive urgency as he was profoundly hypertensive on arrival or due to new coronary disease. Echocardiogram demonstrated significantly decreased ejection fraction recently echocardiogram EF in the 20-25% range with possible LAD wall motion abnormalities. He was aggressively diuresed and treatment and since it has remained comfortable throughout her in consultation. Cardiac catheterization yesterday was postponed due to increasing creatinine. He was hydrated aggressively overnight and this morning and was now referred for diagnostic coronary angiography.  PRE-OPERATIVE DIAGNOSIS: Non-ST elevation MI Cardiomyopathy of unclear etiology Uncontrolled hypertension/malignant hypertension on arrival  POST-OPERATIVE DIAGNOSIS:  Non-obstructive coronary artery disease with the exception of the pre-existing diagonal branch #2 lesion as previously described Nonischemic cardiomyopathy  PROCEDURE:  Procedure(s): LEFT HEART CATHETERIZATION WITH CORONARY ANGIOGRAM (without Left Ventriculography) - Right Femoral Artery Access  SURGEON:  Surgeon(s): Marykay Lex  ANESTHESIA:   local and IV sedation; 1 mg Versed, 50 mcg fentanyl LOCAL MEDICATIONS USED:  LIDOCAINE 18 ml  Findings:  Central Aortic Pressure/Mean Aortic Pressure: 125/61 mmHg; 85 mmHg  Left Ventricular Pressures/ LVEDP: 122/3 mmHg; 9 mmHg Left main: angiographically normal, large LAD, large Circumflex, very small Ramus Intermedius  LAD: large caliber vessel which, the apex gives rise to very small proximal diagonal branch and a very large bifurcating first septal perforator. Dissected out there is a second  diagonal branch the major branch. At this bifurcation there is a roughly 40% lesion. The remainder vessels without any significant disease. The second branch has a possible 40% followed by a focal 60 cm lesion which is no significant change from previous cardiac catheterization.  Left Circumflex: Large caliber nondominant vessel, gives rise to small OM 1 and then 3 moderate caliber obtuse marginal branches into the AV groove (the third of which is likely in the left posterior lateral branch). No significant disease noted, just mild luminal irregularities  Ramus Intermedius: Small caliber runs a high diagonal distribution, no angiographically significant disease Right Coronary Artery: Large dominant vessel with extensive tort tortuosity in the proximal segment; bifurcates into Right Posterior Atrioventricular Groove Branch and a double-barreled Posterior Descending Artery.  There are 2 small posterior lateral branches coming off of the proximal PDA before the bifurcation. The posterior intraventricular groove vessel gives rise to 2 large posterior lateral vessels. No significant disease in the RCA system.  Contrast 35 mL Omnipaque  EBL:    less than 10 mL  DICTATION: .Note written in EPIC  PLAN OF CARE: Returned to his room with IV hydration   Standard post-catheterization sheath removal once ACT less than 160 seconds  Continue to hold Lasix as the LVDP is extremely low. We'll change to daily upon discharge.  Continue ACE inhibitor until creatinine stable  Schedule for Life-Vest placement prior to discharge  PATIENT DISPOSITION:  PACU - hemodynamically stable.  Back to initial floor for IVF hydration  Plan d/c in AM if stable  Delay start of Pharmacological VTE agent (>24hrs) due to surgical blood loss or risk of bleeding:  Not applicable   Reine Bristow W, M.D., M.S. [facility] [address] [place], [place]   [address]  [phone]  11/25/2011 3:07 PM

## 2011-11-25 NOTE — Op Note (Signed)
[facility]     CARDIAC CATHETERIZATION REPORT  [name]   MRN: [id number] Mar 16, 1941   ADMIT DATE:  11/22/2011  Performing Cardiologist: Marykay Lex Primary Physician: Burtis Junes, MD Primary Cardiologist:  Lennette Bihari., MD  Procedures Performed:  Left Heart Catheterization via 5 Fr Right Common Femoral Artery access  Indication(s): NSTEMI  Cardiomyopathy of unclear etiology   Uncontrolled hypertension/malignant hypertension on arrival  History: 71 y.o. male with a history of Diagonal disease treated medically for years who presented on 11/22/2011 with acute respiratory failure secondary to acute on chronic combined systolic/diastolic CHF with mildly positive troponins c/w non-ST elevation MI as either due to hypertensive urgency as he was profoundly hypertensive on arrival or due to new coronary disease. Echocardiogram demonstrated significantly decreased ejection fraction recently echocardiogram EF in the 20-25% range with possible LAD wall motion abnormalities. He was aggressively diuresed and treatment and since it has remained comfortable throughout her in consultation. Cardiac catheterization yesterday was postponed due to increasing creatinine. He was hydrated aggressively overnight and this morning and was now referred for diagnostic coronary angiography.  Consent: The procedure with Risks/Benefits/Alternatives and Indications was reviewed with the patient (and family).  All questions were answered.    Risks / Complications include, but not limited to: Death, MI, CVA/TIA, VF/VT (with defibrillation), Bradycardia (need for temporary pacer placement), contrast induced nephropathy -- with borderline renal failure special attention was given to discuss risk of worsening renal failure with possible HD, bleeding / bruising / hematoma / pseudoaneurysm, vascular or coronary injury (with possible emergent CT or Vascular Surgery), adverse  medication reactions, infection.    The patient (and family) voice understanding and agree to proceed.    Consent for signed by MD and patient with RN witness -- placed on chart.  Procedure: The patient was brought to the 2nd Floor [facility] in the fasting state and prepped and draped in the usual sterile fashion for Right groin or radial access. After a modified Allen's test demonstrated inadequate Right Ulnar artery collateralization, the decision was made to proceed with femoral access.  Sterile technique was used including antiseptics, cap, gloves, gown, hand hygiene, mask and sheet.  Skin prep: Chlorhexidine;  Time Out: Verified patient identification, verified procedure, site/side was marked, verified correct patient position, special equipment/implants available, medications/allergies/relevent history reviewed, required imaging and test results available.  Performed  The right femoral head was identified using tactile and fluoroscopic technique.  The right groin was anesthetized with 1% subcutaneous Lidocaine.  The right Common Femoral Artery was accessed using the Modified Seldinger Technique with placement of a antimicrobial bonded/coated single lumen (5 Fr) sheath.  The sheath was aspirated and flushed.    A 5 Fr JL 4.0 followed by a JR 4.0 Catheter was advanced of over a Standard J) wire into the ascending Aorta.  The catheters was used to engage the Left then Right Coronary Artery.  Multiple cineangiographic views of the Left then Right Coronary Artery system(s) were performed.   The JR 4.0 catheter was then exchanged over the standard J wire for an angled Pigtail catheter that was advanced across the Aortic Valve.  LV hemodynamics were measured, but Left Ventriculography was performed to conserve contrast.  The catheter was pulled back across the Aortic Valve for measurement of "pull-back" gradient.  The catheter and wire was removed completely out of the  body.  The patient was transferred to the holding area where the sheath was removed (  after ACT checked) with manual pressure held for hemostasis.    The patient was transported to the PACU in hemodynamically stable condition.   The patient  was not stable before, during and following the procedure.   Patient did tolerate procedure well. There were not complications.  EBL: <10 ml  Medications:  Premedication: 5 mg  Valium  Sedation:  1 mg IV Versed, 50  Mcg IV Fentanyl  Contrast:  35 ml Omnipaque  Hemodynamics:  Central Aortic Pressure / Mean Aortic Pressure: 125/61 mmHg; 85 mmHg  LV Pressure / LV End diastolic Pressure:  122/3 mmHg; 9 mmHg  Left Ventriculography: Not performed.  Angiographic Findings: Left main: angiographically normal, large LAD, large Circumflex, very small Ramus Intermedius  LAD: large caliber vessel which, the apex gives rise to very small proximal diagonal branch and a very large bifurcating first septal perforator. Dissected out there is a second diagonal branch the major branch. At this bifurcation there is a roughly 40% lesion. The remainder vessels without any significant disease. The second branch has a possible 40% followed by a focal 60 cm lesion which is no significant change from previous cardiac catheterization.  Left Circumflex: Large caliber nondominant vessel, gives rise to small OM 1 and then 3 moderate caliber obtuse marginal branches into the AV groove (the third of which is likely in the left posterior lateral branch). No significant disease noted, just mild luminal irregularities  Ramus Intermedius: Small caliber runs a high diagonal distribution, no angiographically significant disease Right Coronary Artery: Large dominant vessel with extensive tort tortuosity in the proximal segment; bifurcates into Right Posterior Atrioventricular Groove Branch and a double-barreled Posterior Descending Artery.  There are 2 small posterior lateral branches coming  off of the proximal PDA before the bifurcation. The posterior intraventricular groove vessel gives rise to 2 large posterior lateral vessels. No significant disease in the RCA system.   Impression: Non-obstructive coronary artery disease with the exception of the pre-existing diagonal branch #2 lesion as previously described  Nonischemic cardiomyopathy Low LVEDP for extend of cardiomyopathy - needs hydration.  Plan:   Returned to his room with IV hydration   Standard post-catheterization sheath removal once ACT less than 160 seconds   Continue to hold Lasix as the LVDP is extremely low. We'll change to daily upon discharge. Continue ACE inhibitor until creatinine stable   Schedule for Life-Vest placement prior to discharge  Patient Disposition: PACU - hemodynamically stable.   Plan d/c in AM if stable after hydration.  The case and results was discussed with the patient (and family). The case and results was not discussed with the patient's PCP. The case and results was not discussed with the patient's Cardiologist, texted with information.  Time Spend Directly with Patient:  30 minutes  HARDING,DAVID W, M.D., M.S. [facility] [address] [place], [place]  [address]  [phone]  11/25/2011 5:12 PM

## 2011-11-25 NOTE — Progress Notes (Signed)
UR Completed.  [name] 11/25/2011 [phone]

## 2011-11-26 ENCOUNTER — Other Ambulatory Visit: Payer: Self-pay

## 2011-11-26 LAB — BASIC METABOLIC PANEL
BUN: 18 mg/dL (ref 6–23)
Calcium: 9.6 mg/dL (ref 8.4–10.5)
GFR calc Af Amer: 55 mL/min — ABNORMAL LOW (ref >90)
GFR calc non Af Amer: 47 mL/min — ABNORMAL LOW (ref >90)
Glucose, Bld: 101 mg/dL — ABNORMAL HIGH (ref 70–99)
Potassium: 4.4 mEq/L (ref 3.5–5.1)
Sodium: 138 mEq/L (ref 135–145)

## 2011-11-26 MED ORDER — CARVEDILOL 6.25 MG PO TABS
6.2500 mg | ORAL_TABLET | Freq: Two times a day (BID) | ORAL | Status: DC
  Administered 2011-11-26 – 2011-11-27 (×2): 6.25 mg via ORAL
  Filled 2011-11-26 (×4): qty 1

## 2011-11-26 NOTE — Progress Notes (Signed)
Subjective:  No SOB  Objective:  Vital Signs in the last 24 hours: Temp:  [97.3 F (36.3 C)-98.1 F (36.7 C)] 98 F (36.7 C) (01/04 0500) Pulse Rate:  [50-68] 68  (01/04 0500) Resp:  [18] 18  (01/04 0500) BP: (132-160)/(59-80) 145/80 mmHg (01/04 0500) SpO2:  [98 %-100 %] 100 % (01/04 0500) Weight:  [72.9 kg (160 lb 11.5 oz)] 160 lb 11.5 oz (72.9 kg) (01/04 0500)  Intake/Output from previous day:  Intake/Output Summary (Last 24 hours) at 11/26/11 0843 Last data filed at 11/26/11 0500  Gross per 24 hour  Intake 1087.5 ml  Output   1700 ml  Net -612.5 ml      . amLODipine  10 mg Oral Daily  . aspirin EC  81 mg Oral Daily  . carvedilol  3.125 mg Oral BID WC  . diazepam  5 mg Oral Once  . enoxaparin  30 mg Subcutaneous Q24H  . fentaNYL      . heparin      . lidocaine      . midazolam      . nitroGLYCERIN      . pantoprazole  40 mg Oral Q1200  . rosuvastatin  10 mg Oral QHS  . sodium chloride  3 mL Intravenous Q12H  . Travoprost (BAK Free)  1 drop Both Eyes QHS  . DISCONTD: nitroGLYCERIN  20 mcg/min Intravenous Once  . DISCONTD: sodium chloride  3 mL Intravenous Q12H  . DISCONTD: sodium chloride  3 mL Intravenous Q12H  . DISCONTD: sodium chloride  3 mL Intravenous Q12H   Physical Exam: General appearance: alert, cooperative and no distress Lungs: clear to auscultation bilaterally Heart: regularly irregular rhythm no lower extremity edema, RFA site without hematoma or echymosis   Rate: 60  Rhythm: normal sinus rhythm and PVC's bigemeny  Lab Results:  Basename 11/25/11 1610  WBC 4.3  HGB 13.8  PLT 125*    Basename 11/26/11 0548 11/25/11 1610 11/25/11 0523  NA 138 -- 141  K 4.4 -- 4.4  CL 103 -- 106  CO2 27 -- 26  GLUCOSE 101* -- 126*  BUN 18 -- 23  CREATININE 1.43* 1.60* --   No results found for this basename: TROPONINI:2,CK,MB:2 in the last 72 hours Hepatic Function Panel No results found for this basename:  PROT,ALBUMIN,AST,ALT,ALKPHOS,BILITOT,BILIDIR,IBILI in the last 72 hours No results found for this basename: CHOL in the last 72 hours No results found for this basename: INR in the last 72 hours   2-d echo: - Left ventricle: The cavity size was normal. Wall thickness   was increased in a pattern of mild LVH. There was mild   concentric hypertrophy. Systolic function was severely   reduced. The estimated ejection fraction was in the range   of 20% to 25%. There is severe hypokinesis of the   mid-distalanteroseptal, anterior, and apical myocardium.   Features are consistent with a pseudonormal left   ventricular filling pattern, with concomitant abnormal   relaxation and increased filling pressure (grade 2   diastolic dysfunction). Possible focal apical thrombus;   cannot exclude. Consider definity contrast study. Cannot   exclude thrombus. - Aortic valve: Trileaflet; mildly thickened, mildly   calcified leafletsinvolving predominantly the noncoronary   cusp. - Mitral valve: Calcified annulus. Mild regurgitation. - Left atrium: The atrium was mildly dilated. - Atrial septum: No defect or patent foramen ovale was   identified. - Tricuspid valve: Mild-moderate regurgitation. Limited study. Definity contrast was given. There is mild  coarse trabeculation of the myocardium at the apex,   however, no definitive LV thrombus is identified  Cardiac Studies:  Assessment/Plan:   Principal Problem:  *Respiratory failure, acute, ( ran out of diuretics 3-4 days prior to adm)  Active Problems:  CAD (coronary artery disease) 80% stenosis diag of the LAD, 30% in OM2 branch of LCX in 2009, cath this admission showed no significant progression of CAD.  Accelerated hypertension  Acute CHF (congestive heart failure) - Combined Systolic / Diastolic  Acute renal insufficiency, (Cr 1.3 12/12 as an OP, on ACE then)  Cardiomyopathy, new LVD, EF was 45-50 by 2D 10/25/11, now 20-25%  Ventricular  ectopy Hx of PVCs  Plan- Will discuss with MD, ? Life vest. Hold lasix and ACE today resume in am, f/u with Dr Tresa Endo in 1-2 wks.  Will titrate carvedilol to 6.25 mg bid. Will resume ACE-I  and diuretic tomorrow; f/u renal fxn in am. ECG shows sinus rhythm with 1 degree AVB and frequent PVC's.  Corine Shelter PA-C 11/26/2011, 8:43 AM Lennette Bihari, MD, [facility] 11/26/2011 9:39 AM

## 2011-11-26 NOTE — Discharge Summary (Signed)
Patient ID: [name],  MRN: [id number], DOB/AGE: [birth year] 73 y.o.  Admit date: 11/22/2011 Discharge date: 11/27/2011  Primary Care Provider:  Primary Cardiologist: Dr Tresa Endo  Discharge Diagnoses Principal Problem:  *Respiratory failure, acute, ( ran out of diuretics 3-4 days prior to adm)  Active Problems:  CAD (coronary artery disease) 80% stenosis diag of the LAD, 30% in OM2 branch of LCX in 2009, no progression this adm by cath  Accelerated hypertension  Acute CHF (congestive heart failure) - Combined Systolic / Diastolic  Acute renal insufficiency, (Cr 1.3 12/12 as an OP, on ACE then)  Cardiomyopathy, new LVD, EF was 45-50 by 2D 10/25/11, now 20-25%  Ventricular ectopy Hx of PVCs, home with life vest till LVF improves    Procedures: Cardiac cath 11/25/11  History of Present Illness:  HPI: 71 year old African American male presents to the emergency room tonight with chest pain and significant shortness of breath. Initial symptoms began Saturday he had chest pain and shortness of breath it lasted a couple of hours and then resolved he was fairly stable until tonight suddenly he was having chest pain no radiation midsternal sharp feeling and then became significantly short of breath. It is associated with nausea no vomiting positive diaphoresis. Came to the emergency room and was found to have acute pulmonary edema with respiratory failure acutely. Was placed on BiPAP given IV Lasix now improving in his symptoms. He continues with some chest pain now on IV nitroglycerin drip which we will titrate. EKG is chaotic with frequent PVCs which he does have a history he appears to be in sinus rhythm with a first-degree block which is normal for him though on this particular EKG to be in atrial fibrillation.   Hospital Course [name] is a 71 year old male is followed by Dr. Tresa Endo. He did have a catheterization in 2009 which showed mild disease that was treated medically. His main problems  have been cardiomyopathy with an EF previously of 35-40% and hypertension. He was admitted New Year's Eve with increasing dyspnea consistent with congestive failure. He was placed on BiPAP on admission. He was hypertensive on admission. He responded to IV Lasix and IV nitroglycerin. His troponins were slightly positive at 0.7. Echocardiogram showed new LV dysfunction with an EF of 20%. It  was decided to proceed with diagnostic catheterization. He does have some baseline renal insufficiency and his diuretics and ACE inhibitor were held on the day of catheterization. Catheterization done by Dr. Herbie Baltimore revealed no significant change from his previous catheterization with mild disease. Plan is for continued medical therapy. The patient did admit to me after admission that he had run out of his diuretics 3 days prior to admission. On telemetry he had frequent PVCs and bigeminy. Because of his new cardiomyopathy and ventricular ectopy on telemetry it was felt that he should go home with a life vest intelligence LV function can be reassessed on medicines. We feel he can be discharged 11/27/2011. He'll follow up with Dr. Tresa Endo.  Discharge Vitals:  Blood pressure 170/65, pulse 76, temperature 98.9 F (37.2 C), temperature source Oral, resp. rate 18, height 5\' 3"  (1.6 m), weight 72.4 kg (159 lb 9.8 oz), SpO2 98.00%.    Labs: Results for orders placed during the hospital encounter of 11/22/11 (from the past 48 hour(s))  CBC     Status: Abnormal   Collection Time   11/25/11  4:10 PM      Component Value Range Comment   WBC 4.3  4.0 -  10.5 (K/uL)    RBC 4.78  4.22 - 5.81 (MIL/uL)    Hemoglobin 13.8  13.0 - 17.0 (g/dL)    HCT 16.1  09.6 - 04.5 (%)    MCV 85.6  78.0 - 100.0 (fL)    MCH 28.9  26.0 - 34.0 (pg)    MCHC 33.7  30.0 - 36.0 (g/dL)    RDW 40.9  81.1 - 91.4 (%)    Platelets 125 (*) 150 - 400 (K/uL)   CREATININE, SERUM     Status: Abnormal   Collection Time   11/25/11  4:10 PM      Component Value  Range Comment   Creatinine, Ser 1.60 (*) 0.50 - 1.35 (mg/dL)    GFR calc non Af Amer 41 (*) >90 (mL/min)    GFR calc Af Amer 48 (*) >90 (mL/min)   BASIC METABOLIC PANEL     Status: Abnormal   Collection Time   11/26/11  5:48 AM      Component Value Range Comment   Sodium 138  135 - 145 (mEq/L)    Potassium 4.4  3.5 - 5.1 (mEq/L)    Chloride 103  96 - 112 (mEq/L)    CO2 27  19 - 32 (mEq/L)    Glucose, Bld 101 (*) 70 - 99 (mg/dL)    BUN 18  6 - 23 (mg/dL)    Creatinine, Ser 7.82 (*) 0.50 - 1.35 (mg/dL)    Calcium 9.6  8.4 - 10.5 (mg/dL)    GFR calc non Af Amer 47 (*) >90 (mL/min)    GFR calc Af Amer 55 (*) >90 (mL/min)   PRO B NATRIURETIC PEPTIDE     Status: Abnormal   Collection Time   11/26/11  5:48 AM      Component Value Range Comment   Pro B Natriuretic peptide (BNP) 617.1 (*) 0 - 125 (pg/mL)   BASIC METABOLIC PANEL     Status: Abnormal   Collection Time   11/27/11  6:00 AM      Component Value Range Comment   Sodium 137  135 - 145 (mEq/L)    Potassium 4.1  3.5 - 5.1 (mEq/L)    Chloride 102  96 - 112 (mEq/L)    CO2 27  19 - 32 (mEq/L)    Glucose, Bld 111 (*) 70 - 99 (mg/dL)    BUN 19  6 - 23 (mg/dL)    Creatinine, Ser 9.56 (*) 0.50 - 1.35 (mg/dL)    Calcium 9.6  8.4 - 10.5 (mg/dL)    GFR calc non Af Amer 46 (*) >90 (mL/min)    GFR calc Af Amer 53 (*) >90 (mL/min)     Disposition:  Follow-up Information    Follow up with BLOUNT,ALVIN VINCENT. Make an appointment in 2 weeks.      Follow up with Lennette Bihari, MD on 12/14/2011. (11:30)    Contact information:   [address] [place] [address] [phone]          Discharge Medications:  Current Discharge Medication List    START taking these medications   Details  pravastatin (PRAVACHOL) 40 MG tablet Take 1 tablet (40 mg total) by mouth daily. Qty: 30 tablet, Refills: 11      CONTINUE these medications which have CHANGED   Details  amLODipine (NORVASC) 10 MG tablet Take 1 tablet  (10 mg total) by mouth daily. Qty: 30 tablet, Refills: 11    furosemide (LASIX) 20 MG tablet Take  1 tablet (20 mg total) by mouth daily. Qty: 30 tablet, Refills: 11    lisinopril (PRINIVIL,ZESTRIL) 40 MG tablet Take 1 tablet (40 mg total) by mouth daily. Qty: 30 tablet, Refills: 11    metoprolol (TOPROL-XL) 50 MG 24 hr tablet Take 1 tablet (50 mg total) by mouth daily. Qty: 30 tablet, Refills: 11      CONTINUE these medications which have NOT CHANGED   Details  aspirin 325 MG tablet Take 325 mg by mouth daily.      pantoprazole (PROTONIX) 40 MG tablet Take 40 mg by mouth daily.      Travoprost, BAK Free, (TRAVATAMN) 0.004 % SOLN ophthalmic solution Place 1 drop into both eyes at bedtime.          Outstanding Labs/Studies  Duration of Discharge Encounter: Greater than 30 minutes including physician time.  Jolene Provost PA-C 11/27/2011 1:56 PM

## 2011-11-26 NOTE — Progress Notes (Signed)
11/26/2011 2:48 AM Pt with rhythm change on the monitor from NSR first degree HB with PVC's HR 60's to bigemny in the 60's. EKG obtained. BP 141/80 HR 68. Pt denies CP and/or SHOB. MD on call made aware. No new orders received. Will continue to monitor. Hively, Avie Echevaria, RN

## 2011-11-27 ENCOUNTER — Other Ambulatory Visit: Payer: Self-pay

## 2011-11-27 LAB — BASIC METABOLIC PANEL
BUN: 19 mg/dL (ref 6–23)
CO2: 27 mEq/L (ref 19–32)
Calcium: 9.6 mg/dL (ref 8.4–10.5)
Chloride: 102 mEq/L (ref 96–112)
Creatinine, Ser: 1.48 mg/dL — ABNORMAL HIGH (ref 0.50–1.35)
GFR calc Af Amer: 53 mL/min — ABNORMAL LOW (ref >90)
GFR calc non Af Amer: 46 mL/min — ABNORMAL LOW (ref >90)
Glucose, Bld: 111 mg/dL — ABNORMAL HIGH (ref 70–99)
Potassium: 4.1 mEq/L (ref 3.5–5.1)
Sodium: 137 mEq/L (ref 135–145)

## 2011-11-27 MED ORDER — PRAVASTATIN SODIUM 40 MG PO TABS: 40.0000 mg | ORAL_TABLET | Freq: Every day | ORAL | Status: DC

## 2011-11-27 MED ORDER — METOPROLOL SUCCINATE ER 50 MG PO TB24: 50.0000 mg | ORAL_TABLET | Freq: Every day | ORAL | Status: DC

## 2011-11-27 MED ORDER — HEART ATTACK BOUNCING BOOK
Freq: Once | Status: DC
  Filled 2011-11-27: qty 1

## 2011-11-27 MED ORDER — FUROSEMIDE 20 MG PO TABS: 20.0000 mg | ORAL_TABLET | Freq: Every day | ORAL | Status: DC

## 2011-11-27 MED ORDER — AMLODIPINE BESYLATE 10 MG PO TABS: 10.0000 mg | ORAL_TABLET | Freq: Every day | ORAL | Status: DC

## 2011-11-27 MED ORDER — NITROGLYCERIN 0.4 MG SL SUBL
SUBLINGUAL_TABLET | SUBLINGUAL | Status: AC
  Filled 2011-11-27: qty 25

## 2011-11-27 MED ORDER — LIVING BETTER WITH HEART FAILURE BOOK
Freq: Once | Status: AC
  Administered 2011-11-27: 06:00:00
  Filled 2011-11-27: qty 1

## 2011-11-27 MED ORDER — EXERCISE FOR HEART AND HEALTH BOOK
Freq: Once | Status: DC
  Filled 2011-11-27 (×2): qty 1

## 2011-11-27 MED ORDER — LISINOPRIL 40 MG PO TABS: 40.0000 mg | ORAL_TABLET | Freq: Every day | ORAL | Status: DC

## 2011-11-27 MED ORDER — ROSUVASTATIN CALCIUM 10 MG PO TABS: 10.0000 mg | ORAL_TABLET | Freq: Every day | ORAL | Status: DC

## 2011-11-27 NOTE — Progress Notes (Signed)
[facility]  DAILY PROGRESS NOTE   Subjective:  No events overnight. Breathing much improved. Creatinine stable.  Objective:  Temp:  [97.8 F (36.6 C)-98.9 F (37.2 C)] 98.9 F (37.2 C) (01/05 0500) Pulse Rate:  [63-67] 66  (01/05 0500) Resp:  [18] 18  (01/05 0500) BP: (130-178)/(64-79) 130/75 mmHg (01/05 0500) SpO2:  [98 %-100 %] 98 % (01/05 0500) Weight:  [72.4 kg (159 lb 9.8 oz)] 159 lb 9.8 oz (72.4 kg) (01/05 0500) Weight change: -0.5 kg (-1 lb 1.6 oz)  Intake/Output from previous day:    Intake/Output from this shift: Total I/O In: 360 [P.O.:360] Out: -   Medications: Current Facility-Administered Medications  Medication Dose Route Frequency Provider Last Rate Last Dose  . 0.9 %  sodium chloride infusion  250 mL Intravenous PRN Marykay Lex      . a stronger pump book   Does not apply Once Lennette Bihari, MD      . acetaminophen (TYLENOL) tablet 650 mg  650 mg Oral Q4H PRN Leone Brand, NP   650 mg at 11/25/11 2322  . ALPRAZolam Prudy Feeler) tablet 0.25 mg  0.25 mg Oral BID PRN Leone Brand, NP   0.25 mg at 11/25/11 2323  . amLODipine (NORVASC) tablet 10 mg  10 mg Oral Daily Marykay Lex   10 mg at 11/26/11 1000  . aspirin EC tablet 81 mg  81 mg Oral Daily Drake Leach Rumbarger, PHARMD   81 mg at 11/26/11 1000  . carvedilol (COREG) tablet 6.25 mg  6.25 mg Oral BID WC Abelino Derrick, PA   6.25 mg at 11/26/11 1715  . enoxaparin (LOVENOX) injection 30 mg  30 mg Subcutaneous Q24H Marykay Lex   30 mg at 11/27/11 1610  . excerise for heart and health book   Does not apply Once Lennette Bihari, MD      . heart attack bouncing book   Does not apply Once Lennette Bihari, MD      . ondansetron [facility]) injection 4 mg  4 mg Intravenous Q6H PRN Marykay Lex      . pantoprazole (PROTONIX) EC tablet 40 mg  40 mg Oral Q1200 Leone Brand, NP   40 mg at 11/26/11 1201  . rosuvastatin (CRESTOR) tablet 10 mg  10 mg Oral QHS Leone Brand, NP   10  mg at 11/26/11 2133  . sodium chloride 0.9 % injection 3 mL  3 mL Intravenous Q12H Marykay Lex   3 mL at 11/26/11 2135  . sodium chloride 0.9 % injection 3 mL  3 mL Intravenous PRN Marykay Lex   3 mL at 11/25/11 2132  . tiZANidine (ZANAFLEX) tablet 4 mg  4 mg Oral Q8H PRN Leone Brand, NP      . Travoprost (BAK Free) (TRAVATAN) 0.004 % ophthalmic solution SOLN 1 drop  1 drop Both Eyes QHS Leone Brand, NP   1 drop at 11/26/11 2133  . DISCONTD: carvedilol (COREG) tablet 3.125 mg  3.125 mg Oral BID WC Piedad Climes Harding   3.125 mg at 11/26/11 9604    Physical Exam: General appearance: alert and no distress Neck: no adenopathy, no carotid bruit, no JVD, supple, symmetrical, trachea midline and thyroid not enlarged, symmetric, no tenderness/mass/nodules Lungs: clear to auscultation bilaterally Heart: regular rate and rhythm, S1, S2 normal and 2/6 systolic murmur at LLSB Abdomen: soft, non-tender; bowel sounds normal; no masses,  no  organomegaly Extremities: extremities normal, atraumatic, no cyanosis or edema  Lab Results: Results for orders placed during the hospital encounter of 11/22/11 (from the past 48 hour(s))  POCT ACTIVATED CLOTTING TIME     Status: Normal   Collection Time   11/25/11 12:31 PM      Component Value Range Comment   Activated Clotting Time 166     CBC     Status: Abnormal   Collection Time   11/25/11  4:10 PM      Component Value Range Comment   WBC 4.3  4.0 - 10.5 (K/uL)    RBC 4.78  4.22 - 5.81 (MIL/uL)    Hemoglobin 13.8  13.0 - 17.0 (g/dL)    HCT 91.4  78.2 - 95.6 (%)    MCV 85.6  78.0 - 100.0 (fL)    MCH 28.9  26.0 - 34.0 (pg)    MCHC 33.7  30.0 - 36.0 (g/dL)    RDW 21.3  08.6 - 57.8 (%)    Platelets 125 (*) 150 - 400 (K/uL)   CREATININE, SERUM     Status: Abnormal   Collection Time   11/25/11  4:10 PM      Component Value Range Comment   Creatinine, Ser 1.60 (*) 0.50 - 1.35 (mg/dL)    GFR calc non Af Amer 41 (*) >90 (mL/min)    GFR calc Af Amer 48  (*) >90 (mL/min)   BASIC METABOLIC PANEL     Status: Abnormal   Collection Time   11/26/11  5:48 AM      Component Value Range Comment   Sodium 138  135 - 145 (mEq/L)    Potassium 4.4  3.5 - 5.1 (mEq/L)    Chloride 103  96 - 112 (mEq/L)    CO2 27  19 - 32 (mEq/L)    Glucose, Bld 101 (*) 70 - 99 (mg/dL)    BUN 18  6 - 23 (mg/dL)    Creatinine, Ser 4.69 (*) 0.50 - 1.35 (mg/dL)    Calcium 9.6  8.4 - 10.5 (mg/dL)    GFR calc non Af Amer 47 (*) >90 (mL/min)    GFR calc Af Amer 55 (*) >90 (mL/min)   PRO B NATRIURETIC PEPTIDE     Status: Abnormal   Collection Time   11/26/11  5:48 AM      Component Value Range Comment   Pro B Natriuretic peptide (BNP) 617.1 (*) 0 - 125 (pg/mL)   BASIC METABOLIC PANEL     Status: Abnormal   Collection Time   11/27/11  6:00 AM      Component Value Range Comment   Sodium 137  135 - 145 (mEq/L)    Potassium 4.1  3.5 - 5.1 (mEq/L)    Chloride 102  96 - 112 (mEq/L)    CO2 27  19 - 32 (mEq/L)    Glucose, Bld 111 (*) 70 - 99 (mg/dL)    BUN 19  6 - 23 (mg/dL)    Creatinine, Ser 6.29 (*) 0.50 - 1.35 (mg/dL)    Calcium 9.6  8.4 - 10.5 (mg/dL)    GFR calc non Af Amer 46 (*) >90 (mL/min)    GFR calc Af Amer 53 (*) >90 (mL/min)     Imaging: No results found.  Assessment:  1. Principal Problem: 2.  *Respiratory failure, acute, ( ran out of diuretics 3-4 days prior to adm) 3. Active Problems: 4.  CAD (coronary artery disease) 80% stenosis diag of the LAD,  30% in OM2 branch of LCX in 2009, no progression this adm 5.  Accelerated hypertension 6.  Ventricular ectopy Hx of PVCs, home with life vest till LVF improves 7.  Acute CHF (congestive heart failure) - Combined Systolic / Diastolic 8.  Acute renal insufficiency, (Cr 1.3 12/12 as an OP, on ACE then) 9.  Cardiomyopathy, new LVD, EF was 45-50 by 2D 10/25/11, now 20-25% 10.   Plan:  1. 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 Symptomatically improved with ambulation, now Class I-II symptoms. Creatinine  has been stable at about 1.4-1.5.  Clinically, I feel he is ready for discharge today.  He is at higher risk for arrythmias, given his reduced EF.  In addition, he has had some ventricular ectopy, but no real NSVT.  I think he would benefit from a LifeVest in the short term as we uptitrate medications.  I have called [employer] to inquire about placement. This can likely be done at home as it would be unlikely for them to place it today.  He should have follow-up with Dr. Tresa Endo in the office.   Time Spent Directly with Patient:  15 minutes  Length of Stay:  LOS: 5 days   Chrystie Nose, MD Attending Cardiologist [facility]  Kainan Patty C 11/27/2011, 8:26 AM

## 2011-11-27 NOTE — Progress Notes (Signed)
Pt c/o chest pain this am, better after NTG. Not sure what to make of this with recent cath showing minor CAD but positive Troponin (presumed secondary to HTN, CHF). Will discuss with MD.  Corine Shelter PA-C 11/27/2011 12:26 PM

## 2011-11-27 NOTE — Progress Notes (Signed)
Called to pt's room.  Pt reports 4/10 chest pain located centrally.  Pain does not radiate.  VSS.  EKG obtained.  1 SL NTG administered.  Will continue to monitor.  Charlena Cross [facility] 11/27/2011 11:23 AM

## 2011-11-27 NOTE — Progress Notes (Signed)
Reexamined patient. Pain occurs on and off when moving or breathing, but not consistently pleuritic. Exquisitely tender to touch over left costochondral joints, c/w costochondritis. I have also reviewed angio images and agree there are no high risk or acute appearing lesions. DC home.

## 2012-05-22 HISTORY — PX: OTHER SURGICAL HISTORY: SHX169

## 2012-05-26 ENCOUNTER — Other Ambulatory Visit: Payer: Self-pay

## 2012-05-26 ENCOUNTER — Emergency Department (HOSPITAL_COMMUNITY): Payer: Medicare Other

## 2012-05-26 ENCOUNTER — Observation Stay (HOSPITAL_COMMUNITY)
Admission: EM | Admit: 2012-05-26 | Discharge: 2012-05-30 | Disposition: A | Discharge status: Home / Self Care | Payer: Medicare Other | Source: Ambulatory Visit | Attending: Cardiology | Admitting: Cardiology

## 2012-05-26 ENCOUNTER — Encounter (HOSPITAL_COMMUNITY): Payer: Self-pay | Admitting: Physical Medicine and Rehabilitation

## 2012-05-26 DIAGNOSIS — I441 Atrioventricular block, second degree: Secondary | ICD-10-CM | POA: Diagnosis not present

## 2012-05-26 DIAGNOSIS — I5023 Acute on chronic systolic (congestive) heart failure: Secondary | ICD-10-CM | POA: Diagnosis present

## 2012-05-26 DIAGNOSIS — R079 Chest pain, unspecified: Secondary | ICD-10-CM

## 2012-05-26 DIAGNOSIS — H548 Legal blindness, as defined in USA: Secondary | ICD-10-CM | POA: Insufficient documentation

## 2012-05-26 DIAGNOSIS — I5022 Chronic systolic (congestive) heart failure: Secondary | ICD-10-CM | POA: Diagnosis present

## 2012-05-26 DIAGNOSIS — I5032 Chronic diastolic (congestive) heart failure: Secondary | ICD-10-CM | POA: Diagnosis present

## 2012-05-26 DIAGNOSIS — I2510 Atherosclerotic heart disease of native coronary artery without angina pectoris: Principal | ICD-10-CM | POA: Diagnosis present

## 2012-05-26 DIAGNOSIS — I200 Unstable angina: Secondary | ICD-10-CM | POA: Diagnosis present

## 2012-05-26 DIAGNOSIS — I428 Other cardiomyopathies: Secondary | ICD-10-CM | POA: Insufficient documentation

## 2012-05-26 DIAGNOSIS — I5043 Acute on chronic combined systolic (congestive) and diastolic (congestive) heart failure: Secondary | ICD-10-CM | POA: Diagnosis present

## 2012-05-26 DIAGNOSIS — N1832 Chronic kidney disease, stage 3b: Secondary | ICD-10-CM | POA: Diagnosis present

## 2012-05-26 DIAGNOSIS — I493 Ventricular premature depolarization: Secondary | ICD-10-CM

## 2012-05-26 DIAGNOSIS — I429 Cardiomyopathy, unspecified: Secondary | ICD-10-CM | POA: Diagnosis present

## 2012-05-26 DIAGNOSIS — I509 Heart failure, unspecified: Secondary | ICD-10-CM | POA: Insufficient documentation

## 2012-05-26 HISTORY — DX: Chronic kidney disease, stage 3 (moderate): N18.3

## 2012-05-26 HISTORY — DX: Acute myocardial infarction, unspecified: I21.9

## 2012-05-26 HISTORY — DX: Chronic kidney disease, stage 3 unspecified: N18.30

## 2012-05-26 HISTORY — DX: Legal blindness, as defined in USA: H54.8

## 2012-05-26 HISTORY — DX: Atrioventricular block, second degree: I44.1

## 2012-05-26 LAB — CBC
HCT: 39.8 % (ref 39.0–52.0)
MCH: 29.8 pg (ref 26.0–34.0)
MCHC: 34.9 g/dL (ref 30.0–36.0)
MCV: 85.4 fL (ref 78.0–100.0)
MCV: 86 fL (ref 78.0–100.0)
Platelets: 148 10*3/uL — ABNORMAL LOW (ref 150–400)
Platelets: 151 10*3/uL (ref 150–400)
RBC: 4.79 MIL/uL (ref 4.22–5.81)
RDW: 12.3 % (ref 11.5–15.5)
WBC: 4.7 10*3/uL (ref 4.0–10.5)
WBC: 4.3 10*3/uL (ref 4.0–10.5)

## 2012-05-26 LAB — BASIC METABOLIC PANEL
BUN: 17 mg/dL (ref 6–23)
Calcium: 9.4 mg/dL (ref 8.4–10.5)
Chloride: 107 mEq/L (ref 96–112)
Creatinine, Ser: 1.56 mg/dL — ABNORMAL HIGH (ref 0.50–1.35)
GFR calc Af Amer: 49 mL/min — ABNORMAL LOW (ref >90)
GFR calc non Af Amer: 42 mL/min — ABNORMAL LOW (ref >90)

## 2012-05-26 LAB — CARDIAC PANEL(CRET KIN+CKTOT+MB+TROPI)
CK, MB: 3.5 ng/mL (ref 0.3–4.0)
Relative Index: 1.5 (ref 0.0–2.5)
Total CK: 240 U/L — ABNORMAL HIGH (ref 7–232)
Troponin I: <0.3 ng/mL (ref <0.30)

## 2012-05-26 LAB — CREATININE, SERUM
Creatinine, Ser: 1.61 mg/dL — ABNORMAL HIGH (ref 0.50–1.35)
GFR calc Af Amer: 47 mL/min — ABNORMAL LOW (ref >90)
GFR calc non Af Amer: 41 mL/min — ABNORMAL LOW (ref >90)

## 2012-05-26 LAB — MAGNESIUM: Magnesium: 1.8 mg/dL (ref 1.5–2.5)

## 2012-05-26 MED ORDER — METOPROLOL SUCCINATE ER 50 MG PO TB24
50.0000 mg | ORAL_TABLET | Freq: Every day | ORAL | Status: DC
  Filled 2012-05-26: qty 1

## 2012-05-26 MED ORDER — FUROSEMIDE 10 MG/ML IJ SOLN
20.0000 mg | Freq: Once | INTRAMUSCULAR | Status: AC
  Administered 2012-05-26: 20 mg via INTRAVENOUS
  Filled 2012-05-26: qty 2

## 2012-05-26 MED ORDER — TRAVOPROST (BAK FREE) 0.004 % OP SOLN
1.0000 [drp] | Freq: Every day | OPHTHALMIC | Status: DC
  Administered 2012-05-27 – 2012-05-29 (×4): 1 [drp] via OPHTHALMIC
  Filled 2012-05-26: qty 2.5

## 2012-05-26 MED ORDER — HEPARIN SODIUM (PORCINE) 5000 UNIT/ML IJ SOLN
5000.0000 [IU] | Freq: Three times a day (TID) | INTRAMUSCULAR | Status: DC
  Administered 2012-05-27 – 2012-05-30 (×12): 5000 [IU] via SUBCUTANEOUS
  Filled 2012-05-26 (×14): qty 1

## 2012-05-26 MED ORDER — ACETAMINOPHEN 325 MG PO TABS: 650.0000 mg | ORAL_TABLET | ORAL | Status: DC | PRN

## 2012-05-26 MED ORDER — SIMVASTATIN 20 MG PO TABS
20.0000 mg | ORAL_TABLET | Freq: Every day | ORAL | Status: DC
  Administered 2012-05-27 – 2012-05-30 (×4): 20 mg via ORAL
  Filled 2012-05-26 (×4): qty 1

## 2012-05-26 MED ORDER — ASPIRIN 325 MG PO TABS
325.0000 mg | ORAL_TABLET | Freq: Every day | ORAL | Status: DC
  Administered 2012-05-27 – 2012-05-30 (×4): 325 mg via ORAL
  Filled 2012-05-26 (×4): qty 1

## 2012-05-26 MED ORDER — ONDANSETRON HCL 4 MG/2ML IJ SOLN: 4.0000 mg | Freq: Four times a day (QID) | INTRAMUSCULAR | Status: DC | PRN

## 2012-05-26 MED ORDER — METOPROLOL SUCCINATE ER 25 MG PO TB24
25.0000 mg | ORAL_TABLET | Freq: Every day | ORAL | Status: DC
  Filled 2012-05-26: qty 1

## 2012-05-26 MED ORDER — NITROGLYCERIN 0.4 MG SL SUBL
0.4000 mg | SUBLINGUAL_TABLET | SUBLINGUAL | Status: DC | PRN
  Administered 2012-05-26: 0.4 mg via SUBLINGUAL
  Filled 2012-05-26: qty 25

## 2012-05-26 MED ORDER — ASPIRIN 81 MG PO CHEW
324.0000 mg | CHEWABLE_TABLET | Freq: Once | ORAL | Status: DC
  Filled 2012-05-26: qty 1
  Filled 2012-05-26: qty 4
  Filled 2012-05-26: qty 1

## 2012-05-26 MED ORDER — PANTOPRAZOLE SODIUM 40 MG PO TBEC
40.0000 mg | DELAYED_RELEASE_TABLET | Freq: Every day | ORAL | Status: DC
  Administered 2012-05-27 – 2012-05-30 (×4): 40 mg via ORAL
  Filled 2012-05-26 (×4): qty 1

## 2012-05-26 MED ORDER — SODIUM CHLORIDE 0.9 % IV SOLN: 250.0000 mL | INTRAVENOUS | Status: DC | PRN

## 2012-05-26 MED ORDER — SODIUM CHLORIDE 0.9 % IJ SOLN
3.0000 mL | Freq: Two times a day (BID) | INTRAMUSCULAR | Status: DC
  Administered 2012-05-27 – 2012-05-30 (×7): 3 mL via INTRAVENOUS

## 2012-05-26 MED ORDER — ZOLPIDEM TARTRATE 5 MG PO TABS: 5.0000 mg | ORAL_TABLET | Freq: Every evening | ORAL | Status: DC | PRN

## 2012-05-26 MED ORDER — AMLODIPINE BESYLATE 10 MG PO TABS
10.0000 mg | ORAL_TABLET | Freq: Every day | ORAL | Status: DC
  Administered 2012-05-27 – 2012-05-30 (×4): 10 mg via ORAL
  Filled 2012-05-26 (×4): qty 1

## 2012-05-26 MED ORDER — FUROSEMIDE 20 MG PO TABS
20.0000 mg | ORAL_TABLET | Freq: Every day | ORAL | Status: DC
  Administered 2012-05-27 – 2012-05-30 (×4): 20 mg via ORAL
  Filled 2012-05-26 (×4): qty 1

## 2012-05-26 MED ORDER — NITROGLYCERIN 2 % TD OINT
1.0000 [in_us] | TOPICAL_OINTMENT | Freq: Four times a day (QID) | TRANSDERMAL | Status: DC
  Administered 2012-05-26 – 2012-05-27 (×4): 1 [in_us] via TOPICAL
  Filled 2012-05-26: qty 1
  Filled 2012-05-26: qty 30

## 2012-05-26 MED ORDER — NITROGLYCERIN 0.4 MG SL SUBL: 0.4000 mg | SUBLINGUAL_TABLET | SUBLINGUAL | Status: DC | PRN

## 2012-05-26 MED ORDER — ALPRAZOLAM 0.25 MG PO TABS: 0.2500 mg | ORAL_TABLET | Freq: Two times a day (BID) | ORAL | Status: DC | PRN

## 2012-05-26 MED ORDER — SODIUM CHLORIDE 0.9 % IV SOLN: INTRAVENOUS | Status: DC

## 2012-05-26 MED ORDER — SODIUM CHLORIDE 0.9 % IJ SOLN: 3.0000 mL | INTRAMUSCULAR | Status: DC | PRN

## 2012-05-26 MED ORDER — SPIRONOLACTONE 12.5 MG HALF TABLET
12.5000 mg | ORAL_TABLET | Freq: Two times a day (BID) | ORAL | Status: DC
  Administered 2012-05-27 – 2012-05-30 (×9): 12.5 mg via ORAL
  Filled 2012-05-26 (×13): qty 1

## 2012-05-26 MED ORDER — LOSARTAN POTASSIUM 50 MG PO TABS
50.0000 mg | ORAL_TABLET | Freq: Two times a day (BID) | ORAL | Status: DC
  Administered 2012-05-27 – 2012-05-30 (×8): 50 mg via ORAL
  Filled 2012-05-26 (×11): qty 1

## 2012-05-26 NOTE — ED Notes (Signed)
Pt presents to department for evaluation of L sided non radiating chest pain. Onset Thursday morning. Pt also states diaphoresis and SOB. 4/10 pain at the time. Describes pain as intermittent sharp sensation. Respirations unlabored. Skin warm and dry. He is conscious alert and oriented x4.

## 2012-05-26 NOTE — Progress Notes (Signed)
Utilization review completed.  

## 2012-05-26 NOTE — Progress Notes (Signed)
1725 cardiac monitor showed sinus Brady 30-40 with heart block , Second degree type 1. ekg reads  First a-v  Sinus brady . Pt asymptomatic  1630 olaced a call to Dr . Herbie Baltimore. Vernona Rieger ingold  Called back and relayed  Resuts To d/c toprol  Tonight and will be evaluated in am. Carried out

## 2012-05-26 NOTE — Progress Notes (Signed)
1552 transfer in ER via strecher .Pleasnt  Denied chest pain no sob , skin warm dry to touch Pt with significant other. Kept pt comfortable in bed orientation done

## 2012-05-26 NOTE — ED Provider Notes (Signed)
History     CSN: [id number]  Arrival date & time 05/26/12  1024   First MD Initiated Contact with Patient 05/26/12 1108      Chief Complaint  Patient presents with  . Chest Pain  . Shortness of Breath    (Consider location/radiation/quality/duration/timing/severity/associated sxs/prior treatment) HPI Pt presenting with left sided chest pain.  Pain began yesterday morning while he was outside.  Pain has been constant since then- was 10/10 yesterday- today is 4/10.  Mild sob, no nausea, some diaphoresis.  Chest pain is worse with exertion.  No fever/chills, no cough, no leg swelling.  Chest pain feels like pressure.  States he does not usually get chest pain similar to this and does not take nitroglycerin.  Called cardiologist office who requested he come to the ED Past Medical History  Diagnosis Date  . Colon polyp, hyperplastic   . Spondylolisthesis   . Hypertension   . CHF (congestive heart failure)   . Unstable angina 11/22/2011  . Respiratory failure, acute 11/22/2011  . CAD (coronary artery disease) 80% stenosis diag of the LAD, 30% in OM2 branch of LCX in 2009 11/22/2011  . Accelerated hypertension 11/22/2011  . Unstable angina 05/26/2012  . LV dysfunction, hx of EF 20% but most recent Echo 03/14/12 EF 35 -40%, life vest discontinued 05/26/2012  . Nonischemic cardiomyopathy   . Ventricular ectopy Hx of PVCs 11/22/2011  . 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 Myocardial infarction 11/22/11  . Acute bronchitis 05/26/12  . Exertional dyspnea   . Legally blind     "both eyes"  . 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 CKD (chronic kidney disease) stage 3, GFR 30-59 ml/min 05/27/2012    Past Surgical History  Procedure Date  . Cystoscopy   . Rotator cuff repair 2000's    left  . Back surgery   . Cardiac catheterization   . Posterior fusion lumbar spine 1979  . Cataract extraction, bilateral 1990's    History reviewed. No  pertinent family history.  History  Substance Use Topics  . Smoking status: Former Smoker -- 50 years    Types: Cigarettes    Quit date: 11/23/1999  . Smokeless tobacco: Never Used  . 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  Home Medications   No current outpatient prescriptions on file.  BP 117/65  Pulse 58  Temp 98.2 F (36.8 C) (Oral)  Resp 18  Ht 5\' 7"  (1.702 m)  Wt 163 lb 5.8 oz (74.1 kg)  BMI 25.59 kg/m2  SpO2 98% Vitals reviewed Physical Exam Physical Examination: General appearance - alert, well appearing, and in no distress Mental status - alert, oriented to person, place, and time Eyes - pupils equal and reactive, muddy sclera Mouth - mucous membranes moist, pharynx normal without lesions Chest - clear to auscultation, no wheezes, rales or rhonchi, symmetric air entry, nontender chest wall Heart - normal rate, regular rhythm, normal S1, S2, no murmurs, rubs, clicks or gallops Abdomen - soft, nontender, nondistended, no masses or organomegaly Musculoskeletal - no joint tenderness, deformity or swelling Extremities - peripheral pulses normal, no pedal edema, no clubbing or cyanosis Skin - normal coloration and turgor, no rashes  ED Course  Procedures (including critical care time)   Date: 05/26/2012 10:31  Rate: 72  Rhythm: sinus bradycardia with first degree AV block  QRS Axis: left  Intervals: PR prolonged  ST/T Wave abnormalities: normal  Conduction Disutrbances:left anterior fascicular block  Narrative Interpretation:   Old EKG Reviewed: no significant changes except prior EKG had PVCs   Date: 05/26/2012 11:04am  Rate: 72  Rhythm: ventricular bigeminy with first degree AV block  QRS Axis: left  Intervals: PR prolonged  ST/T Wave  abnormalities: nonspecific ST/T changes  Conduction Disutrbances:first-degree A-V block   Narrative Interpretation:   Old EKG Reviewed: changes noted    1:17 PM  Discussed with Dr. Tresa Endo, and [facility] PA- they will see and evaluate patient in the ED  Labs Reviewed  CBC - Abnormal; Notable for the following:    Platelets 148 (*)     All other components within normal limits  BASIC METABOLIC PANEL - Abnormal; Notable for the following:    Glucose, Bld 117 (*)     Creatinine, Ser 1.56 (*)     GFR calc non Af Amer 42 (*)     GFR calc Af Amer 49 (*)     All other components within normal limits  CARDIAC PANEL(CRET KIN+CKTOT+MB+TROPI) - Abnormal; Notable for the following:    Total CK 240 (*)     All other components within normal limits  PRO B NATRIURETIC PEPTIDE - Abnormal; Notable for the following:    Pro B Natriuretic peptide (BNP) 385.1 (*)     All other components within normal limits  HEMOGLOBIN A1C - Abnormal; Notable for the following:    Hemoglobin A1C 5.9 (*)     Mean Plasma Glucose 123 (*)     All other components within normal limits  CREATININE, SERUM - Abnormal; Notable for the following:    Creatinine, Ser 1.61 (*)     GFR calc non Af Amer 41 (*)     GFR calc Af Amer 47 (*)     All other components within normal limits  BASIC METABOLIC PANEL - Abnormal; Notable for the following:    Glucose, Bld 115 (*)     Creatinine, Ser 1.64 (*)     GFR calc non Af Amer 40 (*)     GFR calc Af Amer 46 (*)     All other components within normal limits  PROTIME-INR - Abnormal; Notable for the following:    Prothrombin Time 16.2 (*)     All other components within normal limits  D-DIMER, QUANTITATIVE  CARDIAC PANEL(CRET KIN+CKTOT+MB+TROPI)  CARDIAC PANEL(CRET KIN+CKTOT+MB+TROPI)  TSH  MAGNESIUM  CBC  CBC  HEPATIC FUNCTION PANEL  CARDIAC PANEL(CRET KIN+CKTOT+MB+TROPI)  LIPID PANEL   Dg Chest Port 1 View  05/26/2012  *RADIOLOGY REPORT*  Clinical Data: Left-sided chest pain   PORTABLE CHEST - 1 VIEW  Comparison: 11/22/2011  Findings: Upper normal heart size.  Tortuous aorta.  Bronchitic changes.  Increased interstitial lung markings at the lung bases. Mild edema cannot be excluded.  No pneumothorax.  No obvious pleural effusion.  IMPRESSION: Increased markings at the lung bases worrisome for mild edema.  Original Report Authenticated By: Donavan Burnet, M.D.     1. Chest pain       MDM  Pt presenting with chest pain, hx of CAD and prior nstemi.  Pt given nitroglycerin for discomfort, aspirin.  Discussed with cardiology team, they will see patient in the ED for admission.  Ethelda Chick, MD 05/28/12 [phone]

## 2012-05-26 NOTE — H&P (Addendum)
[name] is an 71 y.o. male.    PCP Dr. Bruna Potter  Cardiology:  Dr. 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  He stated yesterday he was bringing out washcloth and developed left anterior chest pain first starts having pains that turned more into her chronic pain during the day he did not have any nitroglycerin to take. The pain would come and go with activity last night he was able to sleep this morning initially he felt fine and then with increased activity the chest pain returned.    The left-sided pain was associated with diaphoresis and shortness of breath but no nausea. Here in the emergency room he was given nitroglycerin with relief of the symptoms.  Currently on my exam he has no chest pain.  Prior to yesterday he had not had any complaints.  He does have pain with a deep breath.  Last cardiac catheterization Was 11/25/2011:  Left main: angiographically normal, large LAD, large Circumflex, very small Ramus Intermedius  LAD: large caliber vessel which, the apex gives rise to very small proximal diagonal branch and a very large bifurcating first septal perforator. Dissected out there is a second diagonal branch the major branch. At this bifurcation there is a roughly 40% lesion. The remainder vessels without any significant disease. The second branch has a possible 40% followed by a focal 60 cm lesion which is no significant change from previous cardiac catheterization.  Left Circumflex: Large caliber nondominant vessel, gives rise to small OM 1 and then 3 moderate caliber obtuse marginal branches into the AV groove (the third of which is likely in the left posterior lateral branch). No significant disease noted, just mild luminal  irregularities  Ramus Intermedius: Small caliber runs a high diagonal distribution, no angiographically significant disease Right Coronary Artery: Large dominant vessel with extensive tort tortuosity in the proximal segment; bifurcates into Right Posterior Atrioventricular Groove Branch and a double-barreled Posterior Descending Artery.  There are 2 small posterior lateral branches coming off of the proximal PDA before the bifurcation. The posterior intraventricular groove vessel gives rise to 2 large posterior lateral vessels. No significant disease in the RCA system.   The heart cath was done because of acute heart failure.  EF at that time was 20-25%. But as stated now EF is 35-40% by echo in April of 2013.      Past Medical History  Diagnosis Date  . Colon polyp, hyperplastic   . Spondylolisthesis   . GERD (gastroesophageal reflux disease)   . Hypertension   . CHF (congestive heart failure)   . Unstable angina 11/22/2011  . Respiratory failure, acute 11/22/2011  . CAD (coronary artery disease) 80% stenosis diag of the LAD, 30% in OM2 branch of LCX in 2009 11/22/2011  . Accelerated hypertension 11/22/2011  . Ventricular ectopy Hx of PVCs 11/22/2011  . Unstable angina 05/26/2012  . LV dysfunction, hx of EF 20% but most recent Echo 03/14/12 EF 35 -40%, life vest discontinued 05/26/2012    Past Surgical History  Procedure Date  . Posteroir lumbar fusion   . Cystoscopy   . Rotator cuff repair   . Back surgery   . Cardiac catheterization     History reviewed. No pertinent family history. Social History:  reports that he has never smoked. He has never used smokeless tobacco. He reports that he does not drink alcohol or use illicit drugs.He is divorced with 4 children 7 grandchildren and is not exercising  Allergies: No Known Allergies  Outpatient medications:  Amlodipine 10 mg one daily Furosemide 20 mg daily Metoprolol ER 50 mg the morning 25 mg in the evening Aspirin 325 mg  daily Travatan eyedrops 1 drop both eyes every evening Prilosec 20 mg daily Pravastatin 40 mg daily Spironolactone 25 mg tablet half a tablet twice a day Losartan 50 mg twice a day Tizanidine 4 mg 3 times a day when necessary  Results for orders placed during the hospital encounter of 05/26/12 (from the past 48 hour(s))  CBC     Status: Abnormal   Collection Time   05/26/12 10:50 AM      Component Value Range Comment   WBC 4.7  4.0 - 10.5 K/uL    RBC 4.66  4.22 - 5.81 MIL/uL    Hemoglobin 13.9  13.0 - 17.0 g/dL    HCT 16.1  09.6 - 04.5 %    MCV 85.4  78.0 - 100.0 fL    MCH 29.8  26.0 - 34.0 pg    MCHC 34.9  30.0 - 36.0 g/dL    RDW 40.9  81.1 - 91.4 %    Platelets 148 (*) 150 - 400 K/uL   BASIC METABOLIC PANEL     Status: Abnormal   Collection Time   05/26/12 10:50 AM      Component Value Range Comment   Sodium 141  135 - 145 mEq/L    Potassium 4.2  3.5 - 5.1 mEq/L    Chloride 107  96 - 112 mEq/L    CO2 26  19 - 32 mEq/L    Glucose, Bld 117 (*) 70 - 99 mg/dL    BUN 17  6 - 23 mg/dL    Creatinine, Ser 7.82 (*) 0.50 - 1.35 mg/dL    Calcium 9.4  8.4 - 95.6 mg/dL    GFR calc non Af Amer 42 (*) >90 mL/min    GFR calc Af Amer 49 (*) >90 mL/min   CARDIAC PANEL(CRET KIN+CKTOT+MB+TROPI)     Status: Abnormal   Collection Time   05/26/12 10:50 AM      Component Value Range Comment   Total CK 240 (*) 7 - 232 U/L    CK, MB 3.6  0.3 - 4.0 ng/mL    Troponin I <0.30  <0.30 ng/mL    Relative Index 1.5  0.0 - 2.5   PRO B NATRIURETIC PEPTIDE     Status: Abnormal   Collection Time   05/26/12 10:50 AM      Component Value Range Comment   Pro B Natriuretic peptide (BNP) 385.1 (*) 0 - 125 pg/mL   D-DIMER, QUANTITATIVE     Status: Normal   Collection Time   05/26/12 10:50 AM      Component Value Range Comment   D-Dimer, Quant <0.22  0.00 - 0.48 ug/mL-FEU    Dg Chest Port 1 View  05/26/2012  *RADIOLOGY REPORT*  Clinical Data: Left-sided chest pain  PORTABLE CHEST - 1 VIEW  Comparison: 11/22/2011   Findings: Upper normal heart size.  Tortuous aorta.  Bronchitic changes.  Increased interstitial lung markings at the lung bases. Mild edema cannot be excluded.  No pneumothorax.  No obvious pleural effusion.  IMPRESSION: Increased markings at the lung bases worrisome for mild edema.  Original Report Authenticated By: Donavan Burnet, M.D.    ROS: General:No recent colds or fevers Skin:No rashes or Ulcers HEENT:No blurred vision or double vision WU:JWJXB pain is described JYN:WGNFAOZHY of breath with pain today GI:No diarrhea constipation or melena GU:No hematuria or dysuria MS:No joint pain Neuro:No syncope or lightheadedness Endo:No diabetes or thyroid disease   Blood pressure 112/68, pulse 53, temperature 98.1 F (36.7 C), temperature source Oral, resp. rate 16, SpO2 100.00%. PE: General:Alert and oriented x3, pleasant affect no acute distress Skin:Warm and dry brisk capillary refill HEENT:Normocephalic sclera clear Neck:Supple no JVD no carotid bruits 2+ carotid and Heart:S1-S2 regular rate and rhythm with a 1/6 systolic murmur no gallop or rub, Occasional to frequent premature beats Lungs:Clear without rales rhonchi or wheezes QMV:HQIONGEX bowel sounds soft nontender  Do not palpate liver spleen or masses Ext:No edema 2+ post tibs bilaterally, 2+ radials bilaterally Neuro:Alert and oriented x3 follows commands moves all extremities    Assessment/Plan Patient Active Problem List  Diagnosis  . Esophageal reflux  . Dysphagia, unspecified  . CAD (coronary artery disease) 80% stenosis diag of the LAD, 30% in OM2 branch of LCX in 2009, no progression this adm  . Ventricular ectopy Hx of PVCs, home with life vest till LVF improves  . Acute CHF (congestive heart failure) - Combined Systolic / Diastolic  . Acute renal insufficiency, (Cr 1.3 12/12 as an OP, on ACE then)  . Cardiomyopathy, new LVD, EF was 45-50 by 2D 10/25/11, now 20-25%  . Unstable angina  . LV dysfunction, hx of EF  20% but most recent Echo 03/14/12 EF 35 -40%, life vest discontinued   PLAN:New onset of unstable angina relief with nitroglycerin here in the emergency room. Will admit for overnight observation if negative cardiac enzymes and no further pain may discharge tomorrow. Currently cardiac enzymes are negative CK is slightly elevated but MB and troponin are negative.  Pain is increased with deep breath, Possibly musculoskeletal.   INGOLD,LAURA R 05/26/2012, 1:54 PM  ATTENDING ATTESTATION:  I have seen and examined the patient along with Nada Boozer, NP..  I have reviewed the chart, notes and new data.  I agree with Laura's note.  Brief Description: 71 y/o man with NICM by cath (most recent EF ~35-40%) with recent NSTEM-acute Pulm edema Dec 2/12-Jan 2/13 with cath in early Jan showing minimal change from prior cath.   Key new complaints: intermittent sharp L sided CP starting yesterday.  Somewhat associated with activity, also with some SOB & diaphoresis.  Key examination changes: BP elevated in 150-160 sbp range, otherwise relatively normal, mild Ao Sclerosis murmur & frequent ectopy  Key new findings / data: ECG with Mena Pauls in 50s with frequent PVCs in Bigeminy pattern on tele & 2nd ECG.  PLAN:  I am not convinced that his Sx are related to angina as opposed to MSK symptoms, but they are associated with sweating & SOB, so cannot exclude angina.    I agree with admit to r/o MI.  As bio-markers are negative, could hold off on inti-coagulation for now.  proBNP only mildly elevated.  May give one dose of IV Lasix & will plan to add Imdur.  He has frequent ectopy, but with baseline HR in 50s, cannot increase BB dose beyond current dose.  He is on BB, ARB (max dose), spironolactone for his NICM  -- IV NTG o/n with conversion to Imdur in AM.  Will await results of cardiac markers, but would consider Myoview ST if  negative as he has had a relatively recent cath.  Will discuss with Dr. Tresa Endo, who  will assess him in the AM.   Marykay Lex, M.D., M.S. [facility] [address] [place], [place]  [address]  [phone]  05/26/2012 3:26 PM

## 2012-05-26 NOTE — ED Notes (Signed)
Dr. Herbie Baltimore is at the bedside to examine the patient.

## 2012-05-27 ENCOUNTER — Encounter (HOSPITAL_COMMUNITY): Payer: Self-pay | Admitting: Cardiology

## 2012-05-27 DIAGNOSIS — N1832 Chronic kidney disease, stage 3b: Secondary | ICD-10-CM | POA: Diagnosis present

## 2012-05-27 DIAGNOSIS — I441 Atrioventricular block, second degree: Secondary | ICD-10-CM | POA: Diagnosis not present

## 2012-05-27 LAB — CARDIAC PANEL(CRET KIN+CKTOT+MB+TROPI)
CK, MB: 3.6 ng/mL (ref 0.3–4.0)
CK, MB: 3.2 ng/mL (ref 0.3–4.0)
Troponin I: <0.3 ng/mL (ref <0.30)

## 2012-05-27 LAB — PROTIME-INR
INR: 1.27 (ref 0.00–1.49)
Prothrombin Time: 16.2 seconds — ABNORMAL HIGH (ref 11.6–15.2)

## 2012-05-27 LAB — LIPID PANEL
HDL: 51 mg/dL (ref >39)
LDL Cholesterol: 40 mg/dL (ref 0–99)
Triglycerides: 52 mg/dL (ref <150)
VLDL: 10 mg/dL (ref 0–40)

## 2012-05-27 LAB — BASIC METABOLIC PANEL
CO2: 25 mEq/L (ref 19–32)
Calcium: 9.6 mg/dL (ref 8.4–10.5)
GFR calc Af Amer: 46 mL/min — ABNORMAL LOW (ref >90)
GFR calc non Af Amer: 40 mL/min — ABNORMAL LOW (ref >90)
Sodium: 140 mEq/L (ref 135–145)

## 2012-05-27 LAB — CBC
Platelets: 167 10*3/uL (ref 150–400)
RBC: 5.02 MIL/uL (ref 4.22–5.81)
WBC: 5 10*3/uL (ref 4.0–10.5)

## 2012-05-27 LAB — HEPATIC FUNCTION PANEL
Bilirubin, Direct: 0.2 mg/dL (ref 0.0–0.3)
Indirect Bilirubin: 0.8 mg/dL (ref 0.3–0.9)
Total Bilirubin: 1 mg/dL (ref 0.3–1.2)

## 2012-05-27 MED ORDER — ISOSORBIDE MONONITRATE ER 30 MG PO TB24
30.0000 mg | ORAL_TABLET | Freq: Every day | ORAL | Status: DC
  Administered 2012-05-27 – 2012-05-30 (×4): 30 mg via ORAL
  Filled 2012-05-27 (×4): qty 1

## 2012-05-27 MED ORDER — METOPROLOL SUCCINATE 12.5 MG HALF TABLET
12.5000 mg | ORAL_TABLET | Freq: Every day | ORAL | Status: DC
  Administered 2012-05-27: 10:00:00 via ORAL
  Administered 2012-05-28 – 2012-05-30 (×3): 12.5 mg via ORAL
  Filled 2012-05-27 (×4): qty 1

## 2012-05-27 NOTE — Progress Notes (Signed)
Subjective: Pt developed Wenckebach last pm. Pm dose of BB held.  This am with Big. PVCs.  No chest pain no SOB.  Objective: Vital signs in last 24 hours: Temp:  [97.7 F (36.5 C)-98.2 F (36.8 C)] 98.2 F (36.8 C) (07/06 0636) Pulse Rate:  [30-69] 69  (07/06 0636) Resp:  [12-18] 16  (07/06 0636) BP: (112-178)/(57-80) 127/80 mmHg (07/06 0636) SpO2:  [97 %-100 %] 97 % (07/06 0636) Weight:  [73.3 kg (161 lb 9.6 oz)-75.9 kg (167 lb 5.3 oz)] 73.3 kg (161 lb 9.6 oz) (07/06 0636) Weight change:  Last BM Date: 05/26/12 Intake/Output from previous day: -1505 07/05 0701 - 07/06 0700 In: 420 [P.O.:420] Out: 1925 [Urine:1925] Intake/Output this shift: Total I/O In: 240 [P.O.:240] Out: 200 [Urine:200]  PE: General:alert and oriented, no complaints.  No dizziness with Bradycardia Heart:S1S2 Regular with premature beats Lungs:clear without rales, rhonchi or wheezes Abd:+ BS soft non tender Ext:no edema    Lab Results:  Basename 05/27/12 0650 05/26/12 1550  WBC 5.0 4.3  HGB 15.0 14.3  HCT 42.8 41.2  PLT 167 151   BMET  Basename 05/27/12 0650 05/26/12 1550 05/26/12 1050  NA 140 -- 141  K 3.6 -- 4.2  CL 102 -- 107  CO2 25 -- 26  GLUCOSE 115* -- 117*  BUN 18 -- 17  CREATININE 1.64* 1.61* --  CALCIUM 9.6 -- 9.4    Basename 05/27/12 0650 05/26/12 2257  TROPONINI <0.30 <0.30    Lab Results  Component Value Date   CHOL 101 05/27/2012   HDL 51 05/27/2012   LDLCALC 40 05/27/2012   TRIG 52 05/27/2012   CHOLHDL 2.0 05/27/2012   Lab Results  Component Value Date   HGBA1C 5.9* 05/26/2012     Lab Results  Component Value Date   TSH 0.923 05/26/2012    Hepatic Function Panel  Basename 05/27/12 0650  PROT 8.2  ALBUMIN 3.8  AST 20  ALT 16  ALKPHOS 112  BILITOT 1.0  BILIDIR 0.2  IBILI 0.8    Basename 05/27/12 0650  CHOL 101   No results found for this basename: PROTIME in the last 72 hours    EKG: Orders placed during the hospital encounter of 05/26/12  . ED EKG  .  EKG 12-LEAD  . EKG 12-LEAD  . EKG 12-LEAD  . EKG 12-LEAD  . EKG 12-LEAD  . EKG 12-LEAD  . EKG 12-LEAD    Studies/Results: Dg Chest Port 1 View  05/26/2012  *RADIOLOGY REPORT*  Clinical Data: Left-sided chest pain  PORTABLE CHEST - 1 VIEW  Comparison: 11/22/2011  Findings: Upper normal heart size.  Tortuous aorta.  Bronchitic changes.  Increased interstitial lung markings at the lung bases. Mild edema cannot be excluded.  No pneumothorax.  No obvious pleural effusion.  IMPRESSION: Increased markings at the lung bases worrisome for mild edema.  Original Report Authenticated By: Donavan Burnet, M.D.    Medications: I have reviewed the patient's current medications.    Marland Kitchen amLODipine  10 mg Oral Daily  . aspirin  324 mg Oral Once  . aspirin  325 mg Oral Daily  . furosemide  20 mg Intravenous Once  . furosemide  20 mg Oral Daily  . heparin  5,000 Units Subcutaneous Q8H  . losartan  50 mg Oral BID  . metoprolol succinate  50 mg Oral Daily  . nitroGLYCERIN  1 inch Topical Q6H  . pantoprazole  40 mg Oral Daily  . simvastatin  20 mg Oral  [id number]  . sodium chloride  3 mL Intravenous Q12H  . spironolactone  12.5 mg Oral BID  . Travoprost (BAK Free)  1 drop Both Eyes QHS  . DISCONTD: metoprolol succinate  25 mg Oral QHS   Assessment/Plan: Patient Active Problem List  Diagnosis  . Esophageal reflux  . Dysphagia, unspecified  . CAD (coronary artery disease) 80% stenosis diag of the LAD, 30% in OM2 branch of LCX in 2009, no progression this adm  . Ventricular ectopy Hx of PVCs, home with life vest till LVF improves  . Acute CHF (congestive heart failure) - Combined Systolic / Diastolic  . Acute renal insufficiency, (Cr 1.3 12/12 as an OP, on ACE then)  . Cardiomyopathy, new LVD, EF was 45-50 by 2D 10/25/11, now 20-25%  . Unstable angina  . LV dysfunction, hx of EF 20% but most recent Echo 03/14/12 EF 35 -40%, life vest discontinued  . Wenckebach's phenomenon, heart block  . CKD (chronic  kidney disease) stage 3, GFR 30-59 ml/min    PLAN: High grade Wenckebach with HR down into the 30's at times.  PM dose of Toprol held  And have decreased am dose.  Negative MI, diuresed.  Change NTG paste to Imdur, ambulate in hall.  ? Out patient nuc and monitor with lower dose of BB though pt does have continued bigeminy PVCs. Dr. Tresa Endo to see and evaluate.     LOS: 1 day   INGOLD,LAURA R 05/27/2012, 9:23 AM   Patient seen and examined. Agree with assessment and plan. Rhythm strips reviewed. Will decrease beta blocker therapy with Wenkebach block. May need to change to tartrate versus succinate formulation if bradyarrythmia continues. Last cath was 6 months ago.  Will plan for outpatient nuclear myoview scan.   Lennette Bihari, MD, [facility] 05/27/2012 10:11 AM

## 2012-05-28 MED ORDER — POTASSIUM CHLORIDE CRYS ER 10 MEQ PO TBCR
10.0000 meq | EXTENDED_RELEASE_TABLET | Freq: Once | ORAL | Status: AC
  Administered 2012-05-28: 10 meq via ORAL
  Filled 2012-05-28: qty 1

## 2012-05-28 NOTE — Progress Notes (Signed)
[facility] Progress Note  Subjective:  Feels well; asymptomatic with reference to ectopy  Objective:   Vital Signs in the last 24 hours: Temp:  [97.8 F (36.6 C)-98.2 F (36.8 C)] 98.2 F (36.8 C) (07/07 0454) Pulse Rate:  [58-70] 66  (07/07 0919) Resp:  [18-20] 18  (07/07 0454) BP: (111-138)/(60-75) 129/60 mmHg (07/07 0919) SpO2:  [98 %-100 %] 98 % (07/07 0454) Weight:  [74.1 kg (163 lb 5.8 oz)] 74.1 kg (163 lb 5.8 oz) (07/07 0454)  Intake/Output from previous day: 07/06 0701 - 07/07 0700 In: 1050 [P.O.:1050] Out: 1475 [Urine:1475]  Scheduled:   . amLODipine  10 mg Oral Daily  . aspirin  324 mg Oral Once  . aspirin  325 mg Oral Daily  . furosemide  20 mg Oral Daily  . heparin  5,000 Units Subcutaneous Q8H  . isosorbide mononitrate  30 mg Oral Daily  . losartan  50 mg Oral BID  . metoprolol succinate  12.5 mg Oral Daily  . pantoprazole  40 mg Oral Daily  . simvastatin  20 mg Oral q1800  . sodium chloride  3 mL Intravenous Q12H  . spironolactone  12.5 mg Oral BID  . Travoprost (BAK Free)  1 drop Both Eyes QHS  . DISCONTD: nitroGLYCERIN  1 inch Topical Q6H    Physical Exam:   General appearance: alert, cooperative and no distress Neck: no carotid bruit and no JVD Lungs: clear to auscultation bilaterally Heart: S1, S2 normal; occ PVC Abdomen: soft, non-tender; bowel sounds normal; no masses,  no organomegaly Extremities: no edema, redness or tenderness in the calves or thighs   Rate: 60 - 70  Rhythm: sinus rhythm ; 1st degree AV block, transient ventricular bigeminy; no further Wenkebach block  Lab Results:    Basename 05/27/12 0650 05/26/12 1550 05/26/12 1050  NA 140 -- 141  K 3.6 -- 4.2  CL 102 -- 107  CO2 25 -- 26  GLUCOSE 115* -- 117*  BUN 18 -- 17  CREATININE 1.64* 1.61* --    Basename 05/27/12 0650 05/26/12 2257  TROPONINI <0.30 <0.30   Hepatic Function Panel  Basename 05/27/12 0650  PROT 8.2  ALBUMIN 3.8    AST 20  ALT 16  ALKPHOS 112  BILITOT 1.0  BILIDIR 0.2  IBILI 0.8   BNP    Component Value Date/Time   PROBNP 385.1* 05/26/2012 1050    Basename 05/27/12 0650  INR 1.27    Lipid Panel     Component Value Date/Time   CHOL 101 05/27/2012 0650   TRIG 52 05/27/2012 0650   HDL 51 05/27/2012 0650   CHOLHDL 2.0 05/27/2012 0650   VLDL 10 05/27/2012 0650   LDLCALC 40 05/27/2012 0650     Imaging:  Dg Chest Port 1 View  05/26/2012  *RADIOLOGY REPORT*  Clinical Data: Left-sided chest pain  PORTABLE CHEST - 1 VIEW  Comparison: 11/22/2011  Findings: Upper normal heart size.  Tortuous aorta.  Bronchitic changes.  Increased interstitial lung markings at the lung bases. Mild edema cannot be excluded.  No pneumothorax.  No obvious pleural effusion.  IMPRESSION: Increased markings at the lung bases worrisome for mild edema.  Original Report Authenticated By: Donavan Burnet, M.D.      Assessment/Plan:   Principal Problem:  *Unstable angina Active Problems:  CAD (coronary artery disease) 80% stenosis diag of the LAD, 30% in OM2 branch of LCX in 2009, no progression this adm  Ventricular ectopy Hx of  PVCs, home with life vest till LVF improves  LV dysfunction, hx of EF 20% but most recent Echo 03/14/12 EF 35 -40%, life vest discontinued  Wenckebach's phenomenon, heart block  CKD (chronic kidney disease) stage 3, GFR 30-59 ml/min  2nd degree Type I block has resolved, but frequent PVC's on telemetry with persistent 1st degree AV block. Will slightly increase beta blocker. Will give 10 meq KCL, check Mg. Probably home tomorrow with outpatient nuclear scan.   Lennette Bihari, MD, [facility] 05/28/2012, 9:29 AM

## 2012-05-29 DIAGNOSIS — R079 Chest pain, unspecified: Secondary | ICD-10-CM

## 2012-05-29 LAB — BASIC METABOLIC PANEL
BUN: 19 mg/dL (ref 6–23)
Chloride: 103 mEq/L (ref 96–112)
Creatinine, Ser: 1.61 mg/dL — ABNORMAL HIGH (ref 0.50–1.35)
GFR calc Af Amer: 47 mL/min — ABNORMAL LOW (ref >90)
GFR calc non Af Amer: 41 mL/min — ABNORMAL LOW (ref >90)
Potassium: 4 mEq/L (ref 3.5–5.1)

## 2012-05-29 LAB — PRO B NATRIURETIC PEPTIDE: Pro B Natriuretic peptide (BNP): 215.5 pg/mL — ABNORMAL HIGH (ref 0–125)

## 2012-05-29 LAB — MAGNESIUM: Magnesium: 2 mg/dL (ref 1.5–2.5)

## 2012-05-29 NOTE — Progress Notes (Signed)
Subjective: Feels well. Incessant monomorphic PVCs, often in bigeminy. Very long 1st degree AV block and occasional Wenckebach cycles (mostly interrupted by PVCs)  Objective: Vital signs in last 24 hours: Temp:  [97.1 F (36.2 C)-98.3 F (36.8 C)] 98.3 F (36.8 C) (07/08 0445) Pulse Rate:  [59-68] 62  (07/08 0445) Resp:  [18-20] 18  (07/08 0445) BP: (129-142)/(50-70) 140/67 mmHg (07/08 0445) SpO2:  [99 %-100 %] 99 % (07/08 0445) Weight:  [74.2 kg (163 lb 9.3 oz)] 74.2 kg (163 lb 9.3 oz) (07/08 0445) Weight change: 0.1 kg (3.5 oz) Last BM Date: 05/28/12 Intake/Output from previous day: -755  Wt 74.2 down from 75.9 at admit. 07/07 0701 - 07/08 0700 In: 1120 [P.O.:1120] Out: 1875 [Urine:1875] Intake/Output this shift: Total I/O In: -  Out: 125 [Urine:125]  PE: General: alert and oriented Heart:irregular Lungs:clear Abd:NT ND, BS normal Ext: no edema   Lab Results:  Basename 05/27/12 0650 05/26/12 1550  WBC 5.0 4.3  HGB 15.0 14.3  HCT 42.8 41.2  PLT 167 151   BMET  Basename 05/29/12 0640 05/27/12 0650  NA 139 140  K 4.0 3.6  CL 103 102  CO2 25 25  GLUCOSE 120* 115*  BUN 19 18  CREATININE 1.61* 1.64*  CALCIUM 9.9 9.6    Basename 05/27/12 0650 05/26/12 2257  TROPONINI <0.30 <0.30    Lab Results  Component Value Date   CHOL 101 05/27/2012   HDL 51 05/27/2012   LDLCALC 40 05/27/2012   TRIG 52 05/27/2012   CHOLHDL 2.0 05/27/2012   Lab Results  Component Value Date   HGBA1C 5.9* 05/26/2012     Lab Results  Component Value Date   TSH 0.923 05/26/2012    Hepatic Function Panel  Basename 05/27/12 0650  PROT 8.2  ALBUMIN 3.8  AST 20  ALT 16  ALKPHOS 112  BILITOT 1.0  BILIDIR 0.2  IBILI 0.8    Basename 05/27/12 0650  CHOL 101   No results found for this basename: PROTIME in the last 72 hours    EKG: Orders placed during the hospital encounter of 05/26/12  . ED EKG  . EKG 12-LEAD  . EKG 12-LEAD  . EKG 12-LEAD  . EKG 12-LEAD  . EKG 12-LEAD  . EKG  12-LEAD  . EKG 12-LEAD    Studies/Results: No results found.  Medications: I have reviewed the patient's current medications.    Marland Kitchen amLODipine  10 mg Oral Daily  . aspirin  324 mg Oral Once  . aspirin  325 mg Oral Daily  . furosemide  20 mg Oral Daily  . heparin  5,000 Units Subcutaneous Q8H  . isosorbide mononitrate  30 mg Oral Daily  . losartan  50 mg Oral BID  . metoprolol succinate  12.5 mg Oral Daily  . pantoprazole  40 mg Oral Daily  . potassium chloride  10 mEq Oral Once  . simvastatin  20 mg Oral q1800  . sodium chloride  3 mL Intravenous Q12H  . spironolactone  12.5 mg Oral BID  . Travoprost (BAK Free)  1 drop Both Eyes QHS   Assessment/Plan: Patient Active Problem List  Diagnosis  . Esophageal reflux  . Dysphagia, unspecified  . CAD (coronary artery disease) 80% stenosis diag of the LAD, 30% in OM2 branch of LCX in 2009, no progression this adm  . Ventricular ectopy Hx of PVCs, home with life vest till LVF improves  . Acute CHF (congestive heart failure) - Combined Systolic / Diastolic  .  Acute renal insufficiency, (Cr 1.3 12/12 as an OP, on ACE then)  . Cardiomyopathy, new LVD, EF was 45-50 by 2D 10/25/11, now 20-25%  . Unstable angina  . LV dysfunction, hx of EF 20% but most recent Echo 03/14/12 EF 35 -40%, life vest discontinued  . Wenckebach's phenomenon, heart block  . CKD (chronic kidney disease) stage 3, GFR 30-59 ml/min   PLAN: Dr. Royann Shivers is evaluating pt.  ? Need for BiV pacer?  EP consult has been called. Most recent Echo from our office in the shadow chart.  Continues with Bigeminy PVCs   LOS: 3 days   INGOLD,LAURA R 05/29/2012, 8:59 AM   I have seen and examined the patient along with [facility] R, NP.  I have reviewed the chart, notes and new data.  I agree with NP's note.  Caught between incessant PVCs (so frequent that they raise question of PVC related cardiomyopathy) and second degree AV block, in setting of non ischemic cardiomyopathy  with moderately depressed EF. Cannot tolerate higher dose betablocker. Good functional status (NYHA class II right now) without overt hypervolemia.  PLAN: Consider following options: - EPS and PVC ablation (may be difficult to achieve and only a short term solution) - pacemaker implantation and enhanced beta blocker/antiarrhythmic therapy; this may worsen LV performance via RV apical pacing, therefore need to consider implanting a dual chamber CRT device from the beginning.   EP consult.   Thurmon Fair, MD, [facility] [phone] 05/29/2012, 9:30 AM

## 2012-05-29 NOTE — Consult Note (Signed)
ELECTROPHYSIOLOGY CONSULT NOTE    Patient ID: HILARY PUNDT MRN: [id number], DOB/AGE: 73-May-1942 73 y.o.  Admit date: 05/26/2012 Date of Consult: 05-29-2012  Primary Physician: Burtis Junes, MD Primary Cardiologist: Daphene Jaeger, MD  Reason for Consultation: Frequent PVC's, conduction system disease, cardiomyopathy  HPI:  Mr. Lamadrid is a 71 year old male with a past medical history of minimally obstructive CAD, non ischemic cardiomyopathy, hypertension, CHF, and chronic kidney disease who was admitted last week for chest pain for which he has ruled out for myocardial infarction.  On telemetry he was noted to have some second degree heart block. He also has known first degree AV block with a PR interval of about 300 ms sinus rates have also been identified in the 50s. Beta blockers were reduced.  Telemetry also revealed 50% of his beats of PVCs with predominant morphology comprising more than about 90-95% of all beats  He has baseline exercise intolerance with class II symptoms. This is manifested by dyspnea on exertion as well as fatigue. He has some peripheral edema. He denies  Syncope .He does report occasional pre-syncope. These episodes occur with shortness of breath and "heart fluttering".   He denies significant exercise intolerance.  He has occasional palpitations.    He was admitted in January of this year for acute congestive heart failure  At that time, his EF was found to be 20% at cath.  There was no new obstructive coronary disease.  He was diuresed and sent home with a LifeVest because of newly decreased EF.  He wore his LifeVest for 3 months.  His EF was reassessed by echo in April and was found to be 35-40%.  He was noted at that time to have frequent ventricular ectopy with a predominately left bundle branch block inferior axis morphology      EP has been asked to evaluate for treatment options  Past Medical History  Diagnosis Date  . Colon polyp, hyperplastic    . Spondylolisthesis   . Hypertension   . CHF (congestive heart failure)   . Unstable angina 11/22/2011  . Respiratory failure, acute 11/22/2011  . CAD (coronary artery disease) 80% stenosis diag of the LAD, 30% in OM2 branch of LCX in 2009 11/22/2011  . Accelerated hypertension 11/22/2011  . Unstable angina 05/26/2012  . LV dysfunction, hx of EF 20% but most recent Echo 03/14/12 EF 35 -40%, life vest discontinued 05/26/2012  . Nonischemic cardiomyopathy   . Ventricular ectopy Hx of PVCs 11/22/2011  . 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 Myocardial infarction 11/22/11  . Acute bronchitis 05/26/12  . Exertional dyspnea   . Legally blind     "both eyes"  . CKD (chronic kidney disease) stage 3, GFR 30-59 ml/min 05/27/2012    Surgical History:  Past Surgical History  Procedure Date  . Cystoscopy   . Rotator cuff repair 2000's    left  . Back surgery   . Cardiac catheterization   . Posterior fusion lumbar spine 1979  . Cataract extraction, bilateral 1990's     Prescriptions prior to admission  Medication Sig Dispense Refill  . amLODipine (NORVASC) 10 MG tablet Take 1 tablet (10 mg total) by mouth daily.  30 tablet  11  . aspirin 325 MG tablet Take 325 mg by mouth daily.        . furosemide (LASIX) 20 MG tablet Take 1 tablet (20 mg total) by mouth  daily.  30 tablet  11  . lisinopril (PRINIVIL,ZESTRIL) 40 MG tablet Take 1 tablet (40 mg total) by mouth daily.  30 tablet  11  . metoprolol (TOPROL-XL) 50 MG 24 hr tablet Take 1 tablet (50 mg total) by mouth daily.  30 tablet  11  . pantoprazole (PROTONIX) 40 MG tablet Take 40 mg by mouth daily.        . pravastatin (PRAVACHOL) 40 MG tablet Take 1 tablet (40 mg total) by mouth daily.  30 tablet  11  . Travoprost, BAK Free, (TRAVATAMN) 0.004 % SOLN ophthalmic solution Place 1 drop into both eyes at bedtime.        Inpatient Medications:    . amLODipine  10 mg Oral Daily  . aspirin  325 mg Oral Daily    . furosemide  20 mg Oral Daily  . heparin  5,000 Units Subcutaneous Q8H  . isosorbide mononitrate  30 mg Oral Daily  . losartan  50 mg Oral BID  . metoprolol succinate  12.5 mg Oral Daily  . pantoprazole  40 mg Oral Daily  . potassium chloride  10 mEq Oral Once  . simvastatin  20 mg Oral q1800  . spironolactone  12.5 mg Oral BID  . Travoprost (BAK Free)  1 drop Both Eyes QHS    Allergies: No Known Allergies  History   Social History  . Marital Status: Legally Separated    Spouse Name: N/A    Number of Children: 3  . Years of Education: N/A   Occupational History  . Retired    Social History Main Topics  . Smoking status: Former Smoker -- 50 years    Types: Cigarettes    Quit date: 11/23/1999  . Smokeless tobacco: Never Used  . Alcohol Use: Yes     05/26/12 "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; stopped drinking in the early 1980's"  . Drug Use: No  . Sexually Active: Yes   History reviewed. No pertinent family history.   Alert and oriented elderly African American male appearing his stated age in no acute distress HENT- normal Eyes- EOMI, without scleral icterus Skin- warm and dry; without rashes LN-neg Neck- supple without thyromegaly, JVP-flat, carotids brisk and full without bruits Back-without CVAT or kyphosis Lungs-clear to auscultation CV-regularly irregular rate and rhythm, nl S1 and S2, no murmurs gallops or rubs, S4-absent Abd-soft with active bowel sounds; no midline pulsation or hepatomegaly Pulses-intact femoral and distal MKS-without gross deformity Neuro- Ax O, CN3-12 intact, grossly normal motor and sensory function Affect engaging   Labs:   Lab Results  Component Value Date   WBC 5.0 05/27/2012   HGB 15.0 05/27/2012   HCT 42.8 05/27/2012   MCV 85.3 05/27/2012   PLT 167 05/27/2012    Lab 05/29/12 0640 05/27/12 0650  NA 139 --  K 4.0 --  CL 103 --  CO2 25 --  BUN 19 --  CREATININE 1.61* --  CALCIUM 9.9 --  PROT -- 8.2  BILITOT -- 1.0  ALKPHOS -- 112  ALT --  16  AST -- 20  GLUCOSE 120* --   Lab Results  Component Value Date   CKTOTAL 174 05/27/2012   CKMB 3.2 05/27/2012   TROPONINI <0.30 05/27/2012   Radiology/Studies: Dg Chest Port 1 View 05/26/2012  *RADIOLOGY REPORT*  Clinical Data: Left-sided chest pain  PORTABLE CHEST - 1 VIEW  Comparison: 11/22/2011  Findings: Upper normal heart size.  Tortuous aorta.  Bronchitic changes.  Increased interstitial lung markings at  the lung bases. Mild edema cannot be excluded.  No pneumothorax.  No obvious pleural effusion.  IMPRESSION: Increased markings at the lung bases worrisome for mild edema.  Original Report Authenticated By: Donavan Burnet, M.D.   Catheterization: 11/25/2011 Left main: angiographically normal, large LAD, large Circumflex, very small Ramus Intermedius  LAD: large caliber vessel which, the apex gives rise to very small proximal diagonal branch and a very large bifurcating first septal perforator. Dissected out there is a second diagonal branch the major branch. At this bifurcation there is a roughly 40% lesion. The remainder vessels without any significant disease. The second branch has a possible 40% followed by a focal 60 cm lesion which is no significant change from previous cardiac catheterization.  Left Circumflex: Large caliber nondominant vessel, gives rise to small OM 1 and then 3 moderate caliber obtuse marginal branches into the AV groove (the third of which is likely in the left posterior lateral branch). No significant disease noted, just mild luminal irregularities  Ramus Intermedius: Small caliber runs a high diagonal distribution, no angiographically significant disease Right Coronary Artery: Large dominant vessel with extensive tort tortuosity in the proximal segment; bifurcates into Right Posterior Atrioventricular Groove Branch and a double-barreled Posterior Descending Artery.  There are 2 small posterior lateral branches coming off of the proximal PDA before the bifurcation. The  posterior intraventricular groove vessel gives rise to 2 large posterior lateral vessels. No significant disease in the RCA system.   ZOX:WRUEA rhythm with bigeminal PVC's  PR interval .  Review of EKG's back to 10-2011 demonstrate 2 different PVC morphologies. Although the predominant morphology is noted almost always. Tracings from this hospitalization demonstrated left bundle branch block   superior axis; the QRS duration is relatively narrow at about 140 ms. Limb lead tracings are similar there is some variation in the R-wave amplitude in leads V1 and V2  ECHO:  April 2013- done at 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  I agree with Dr. [name] assessment that there may be a correlation between his ventricular ectopy burden in his cardiomyopathy and subsequently with his congestive heart failure.  Unfortunately his conduction system disease old manifested by bradycardia as well as first degree AV block and occasional second degree AV block preclude the use of amiodarone for PVC suppression. His renal dysfunction makes Class 3 drug therapy uninviting .  Mexiletine is one potential option for PVC suppression. An alternative approach would be catheter ablation for dominant morphology. Emerged from a outflow tract and right or left ventricle. The relatively narrow QRS suggests it  may be septal related to the conduction system.  I spoke with Dr. Johney Frame who will look at the tracings with the patient tomorrow and make a determination as to whether we should proceed primarily with catheter ablation or perhaps undertake a trial of mexiletine for suppression. His left ventricular dysfunction, if indeed related to PVCs, may well improve  and decrease potential toxicities of mexiletine.  Very much for this consultation and we will follow with you

## 2012-05-29 NOTE — Progress Notes (Signed)
[name] is a member of our congregation and I visited him with his pastor present.  He was sitting in the chair by the window and said the doctor told him he had not had a heart attack and he said he was glad of that. He said he would have a procedure done in the morning and could go home following the procedure.  We had prayer with him after a few moments of general conversation.  He was thankful for the visit and said he knew his pastor would come. Sarita Haver Holder, [employer]

## 2012-05-30 DIAGNOSIS — I4949 Other premature depolarization: Secondary | ICD-10-CM

## 2012-05-30 MED ORDER — ISOSORBIDE MONONITRATE ER 30 MG PO TB24: 30.0000 mg | ORAL_TABLET | Freq: Every day | ORAL | Status: DC

## 2012-05-30 MED ORDER — SPIRONOLACTONE 25 MG PO TABS: 12.5000 mg | ORAL_TABLET | Freq: Two times a day (BID) | ORAL | Status: DC

## 2012-05-30 MED ORDER — NITROGLYCERIN 0.4 MG SL SUBL: 0.4000 mg | SUBLINGUAL_TABLET | SUBLINGUAL | Status: DC | PRN

## 2012-05-30 MED ORDER — METOPROLOL SUCCINATE ER 25 MG PO TB24: 12.5000 mg | ORAL_TABLET | Freq: Every day | ORAL | Status: DC

## 2012-05-30 NOTE — Progress Notes (Signed)
Seen by Dr. Johney Frame with recommendation to d/c . Placed a call to [facility] cardiology.

## 2012-05-30 NOTE — Progress Notes (Signed)
Discharge instructions given to pt. Verbalized understanding.

## 2012-05-30 NOTE — Progress Notes (Signed)
Subjective: He does feel SOB at times  Objective: Vital signs in last 24 hours: Temp:  [98.1 F (36.7 C)-98.2 F (36.8 C)] 98.1 F (36.7 C) (07/09 0651) Pulse Rate:  [57-70] 70  (07/09 0651) Resp:  [18-20] 18  (07/09 0651) BP: (99-159)/(54-77) 139/77 mmHg (07/09 0651) SpO2:  [97 %-100 %] 97 % (07/09 0651) Weight:  [74.072 kg (163 lb 4.8 oz)] 74.072 kg (163 lb 4.8 oz) (07/09 0651) Weight change: -0.128 kg (-4.5 oz) Last BM Date: 05/28/12 Intake/Output from previous day: -965  ( total neg -3650 for stay) wt 74.0 down from 75.9 07/08 0701 - 07/09 0700 In: 1860 [P.O.:1857; I.V.:3] Out: 2825 [Urine:2825] Intake/Output this shift:    PE: General:alert and oriented, pleasant affect no acute distress Heart:S1S2 irreg with premature beats Lungs:clear without rales rhonchi or wheezes Abd:+ BS, soft, non tender Ext:no edema    Lab Results: No results found for this basename: WBC:2,HGB:2,HCT:2,PLT:2 in the last 72 hours BMET  [facility] 05/29/12 0640  NA 139  K 4.0  CL 103  CO2 25  GLUCOSE 120*  BUN 19  CREATININE 1.61*  CALCIUM 9.9   No results found for this basename: TROPONINI:2,CK,MB:2 in the last 72 hours  Lab Results  Component Value Date   CHOL 101 05/27/2012   HDL 51 05/27/2012   LDLCALC 40 05/27/2012   TRIG 52 05/27/2012   CHOLHDL 2.0 05/27/2012   Lab Results  Component Value Date   HGBA1C 5.9* 05/26/2012     Lab Results  Component Value Date   TSH 0.923 05/26/2012     EKG: Orders placed during the hospital encounter of 05/26/12  . ED EKG  . EKG 12-LEAD  . EKG 12-LEAD  . EKG 12-LEAD  . EKG 12-LEAD  . EKG 12-LEAD  . EKG 12-LEAD  . EKG 12-LEAD    Studies/Results: No results found.  Medications: I have reviewed the patient's current medications.    Marland Kitchen amLODipine  10 mg Oral Daily  . aspirin  324 mg Oral Once  . aspirin  325 mg Oral Daily  . furosemide  20 mg Oral Daily  . heparin  5,000 Units Subcutaneous Q8H  . isosorbide mononitrate  30 mg Oral Daily    . losartan  50 mg Oral BID  . metoprolol succinate  12.5 mg Oral Daily  . pantoprazole  40 mg Oral Daily  . simvastatin  20 mg Oral q1800  . sodium chloride  3 mL Intravenous Q12H  . spironolactone  12.5 mg Oral BID  . Travoprost (BAK Free)  1 drop Both Eyes QHS   Assessment/Plan: Patient Active Problem List  Diagnosis  . Esophageal reflux  . CAD (coronary artery disease) 80% stenosis diag of the LAD, 30% in OM2 branch of LCX in 2009, no progression this adm  . Ventricular ectopy -history of bigeminy  . Chronic systolic heart failure  . 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 Wenckebach's phenomenon, heart block  . CKD (chronic kidney disease) stage 3, GFR 30-59 ml/min   PLAN: See Dr. Odessa Fleming note.  Dr. Johney Frame to see today and poss. Plan for ablation as outpatient.  Continues in Bigeminy PVCs with occ 3 beat burst of NSVT. No further wenckebach noted.  Pro bnp 215 today, was only mildly elevated on admit. At 385, but responded to 1 dose of IV Lasix.   LOS: 4 days   INGOLD,LAURA R 05/30/2012, 8:19 AM   Agree with note written by Vernona Rieger  [place] RNP  Admitted with CP. Enz neg. BNP low. Recent cath within 6 months shows stable CAD w/o progression with improved LV fxn to 35-30%. He has had PVCs and Wenkebach on tele. BB was being titrated. EP seeing. Probably OK for D/C from our point of view with OP myoview unless EP wants to do a procedure. Given that titrating up BB will improve PVCs but probably worsen Wenkebach, I wonder whether PTVPM is an option.  Runell Gess 05/30/2012 10:19 AM

## 2012-05-30 NOTE — Progress Notes (Signed)
SUBJECTIVE: The patient is doing well today.  At this time, he denies chest pain, shortness of breath, or any new concerns.  He wants to go home.     . amLODipine  10 mg Oral Daily  . aspirin  324 mg Oral Once  . aspirin  325 mg Oral Daily  . furosemide  20 mg Oral Daily  . heparin  5,000 Units Subcutaneous Q8H  . isosorbide mononitrate  30 mg Oral Daily  . losartan  50 mg Oral BID  . metoprolol succinate  12.5 mg Oral Daily  . pantoprazole  40 mg Oral Daily  . simvastatin  20 mg Oral q1800  . sodium chloride  3 mL Intravenous Q12H  . spironolactone  12.5 mg Oral BID  . Travoprost (BAK Free)  1 drop Both Eyes QHS      . sodium chloride      OBJECTIVE: Physical Exam: Filed Vitals:   05/29/12 1700 05/29/12 2148 05/30/12 0651 05/30/12 1340  BP: 150/64 159/69 139/77 120/72  Pulse: 67 57 70 61  Temp:  98.1 F (36.7 C) 98.1 F (36.7 C) 98.2 F (36.8 C)  TempSrc:  Oral Oral Oral  Resp: 18 18 18 19  Height:      Weight:   163 lb 4.8 oz (74.072 kg)   SpO2: 98% 99% 97% 100%    Intake/Output Summary (Last 24 hours) at 05/30/12 1523 Last data filed at 05/30/12 1244  Gross per 24 hour  Intake    978 ml  Output   2200 ml  Net  -1222 ml    Telemetry reveals sinus rhythm  GEN- The patient is well appearing, alert and oriented x 3 today.   Head- normocephalic, atraumatic Ears- hearing intact Oropharynx- clear Neck- supple, Lungs- Clear to ausculation bilaterally, normal work of breathing Heart- Regular rate and rhythm with frequent ectopy,   GI- soft, NT, ND, + BS Extremities- no clubbing, cyanosis, or edema Neuro- strength and sensation are intact  LABS: Basic Metabolic Panel:  Basename 05/29/12 0640  NA 139  K 4.0  CL 103  CO2 25  GLUCOSE 120*  BUN 19  CREATININE 1.61*  CALCIUM 9.9  MG 2.0  PHOS --     ASSESSMENT AND PLAN:  Active Problems:  CAD (coronary artery disease) 80% stenosis diag of the LAD, 30% in OM2 branch of LCX in 2009, no progression  this adm  Ventricular ectopy -history of bigeminy  Chronic systolic heart failure  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  I agree with Dr Klein that this PVC focus is the probable cause for his decline in EF.  Unfortunately, our antiarrhythmic options are very limited.  I do not think that a pacemaker would provide significant benefit and I also do not think that with his frequent PVCs that we could effectively resynchronize his heart with CRT.  I agree with Dr Klein that ablation is the best next step at this point.  Therapeutic strategies for ventricular tachycardia/ frequent PVCs including medicine and ablation were discussed in detail with the patient today. Risk, benefits, and alternatives to EP study and radiofrequency ablation were also discussed in detail today. These risks include but are not limited to stroke, bleeding, vascular damage, tamponade, perforation, damage to the heart and other structures, AV block requiring pacemaker, worsening   renal function, and death. The patient understands these risk and wishes to proceed.  We will therefore proceed with catheter ablation at the next available time.  No medicine changes are necessary from an EP standpoint.  OK to discharge to home.  Return 06/06/12 at 5:30am for EP study and ablation (my office will arrange).    Avondre Richens, MD 05/30/2012 3:23 PM  

## 2012-05-30 NOTE — Plan of Care (Signed)
Problem: Problem: Cardiovascular Progression Goal: NO ARRHYTHMIAS Outcome: Progressing Pt  Awaiting for Dr. Johney Frame for consult to discuss options of management of arrythmias

## 2012-05-31 ENCOUNTER — Encounter (HOSPITAL_COMMUNITY): Payer: Self-pay | Admitting: Pharmacy Technician

## 2012-06-01 DIAGNOSIS — Z9289 Personal history of other medical treatment: Secondary | ICD-10-CM

## 2012-06-01 HISTORY — DX: Personal history of other medical treatment: Z92.89

## 2012-06-05 ENCOUNTER — Encounter (HOSPITAL_COMMUNITY): Payer: Self-pay | Admitting: Cardiology

## 2012-06-05 DIAGNOSIS — I200 Unstable angina: Secondary | ICD-10-CM | POA: Diagnosis present

## 2012-06-05 DIAGNOSIS — I5043 Acute on chronic combined systolic (congestive) and diastolic (congestive) heart failure: Secondary | ICD-10-CM | POA: Diagnosis present

## 2012-06-05 DIAGNOSIS — I5023 Acute on chronic systolic (congestive) heart failure: Secondary | ICD-10-CM | POA: Diagnosis present

## 2012-06-05 NOTE — Discharge Summary (Signed)
Physician Discharge Summary  Patient ID: [name] MRN: [id number] DOB/AGE: February 22, 1941 71 y.o.  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 He stated on 05/25/12 he was wringing out a washcloth and developed left anterior chest pain first started having pains that turned more into her chronic pain during the day he did not have any nitroglycerin to take. The pain would come and go with activity, last night.  He was able to sleep.  This morning initially he felt fine and then with increased activity the chest pain returned.   The left-sided pain was associated with diaphoresis and shortness of breath but no nausea. In the emergency room he was given nitroglycerin with relief of the symptoms. On exam he had no chest pain. Prior to the episode of chest pain he had not had any complaints. He does have pain with a deep breath.   Cardiac cath in January 2013: Left main: angiographically normal, large LAD, large Circumflex, very small Ramus Intermedius  LAD: large caliber vessel which, the apex gives rise to very small proximal diagonal branch and a very large bifurcating first septal  perforator. Dissected out there is a second diagonal branch the major branch. At this bifurcation there is a roughly 40% lesion. The remainder vessels without any significant disease. The second branch has a possible 40% followed by a focal 60 cm lesion which is no significant change from previous cardiac catheterization.  Left Circumflex: Large caliber nondominant vessel, gives rise to small OM 1 and then 3 moderate caliber obtuse marginal branches into the AV groove (the third of which is likely in the left posterior lateral branch). No significant disease noted, just mild luminal irregularities  Ramus Intermedius: Small caliber runs a high diagonal distribution, no angiographically significant disease Right Coronary Artery: Large dominant vessel with extensive tort tortuosity in the proximal segment; bifurcates into Right Posterior Atrioventricular Groove Branch and a double-barreled Posterior Descending Artery.  There are 2 small posterior lateral branches coming off of the proximal PDA before the bifurcation. The posterior intraventricular groove vessel gives rise to 2 large posterior lateral vessels. No significant disease in the RCA system.   Patient was admitted with IV nitroglycerin and IV heparin. Plan is to admit for observation..  By the next day his cardiac enzymes were negative but unfortunately he developed when [facility]. His beta blocker was held.  He had no further chest pain. He was kept in the hospital to further evaluate his arrhythmias.  With the Wenckebach his heart rate was as low as 30.  When he was not bradycardic he was in bigeminy PVCs.  There was concern that if he did not have PVCs he would be in continual Wenkebach.  EP consult was obtained.  It was felt amiodarone for suppression of PVCs would create increased bradycardia.  His renal dysfunction but may class III drug therapy on inviting. Also mexiletine was thought to be one potential option for PVC suppression.  Catheter  ablation would be an alternative approach.  The day of discharge Dr. Johney Frame had seen and evaluated the patient.  He do not believe a pacemaker would provide significant benefit and he also do not think resynchronize dictation with CRT would be effective because of PVCs.  He agreed with Dr. Graciela Husbands that catheter ablation would be best choice for this patient.  Patient will return for outpatient study 06/06/2012 at 5:30 AM for EP study and ablation.  Please also note initial presentation patient had mild congestive heart failure on his chronic systolic heart failure was given diuretics which improved his heart failure.  Weight on admission 75.9 kg Weight at discharge 74.07 kg  Patient was -[id number] for hospitalization   And discharge patient was continued on lower dose of beta blocker. This will change after his ablation.  Additionally we will do a stress test in the office to evaluate him for ischemia prior to his ablation.  In discharge patient was ambulating without complications stable and ready for discharge he was seen by Dr. Nanetta Batty.     Consults: cardiology with Dr. Rich Reining  Significant Diagnostic Studies:  At discharge sodium 139 potassium 4.0 chloride 103 CO2 25 BUN 19 creatinine 1.61 calcium 9.9 glucose 120 magnesium 2.0  LFTs were within normal  Cardiac enzymes were negative  Pro BNP on admission 385 at discharge 215  Cholesterol 101 triglycerides 52 HDL 51 LDL 40  Hemoglobin 15 hematocrit 42.8 platelets 167 WBC 5.0 TSH 0.93 D-dimer less than 0.22  Portable chest x-ray on admission:  Findings: Upper normal heart size. Tortuous aorta. Bronchitic  changes. Increased interstitial lung markings at the lung bases.  Mild edema cannot be excluded. No pneumothorax. No obvious  pleural effusion.  IMPRESSION:  Increased markings at the lung bases worrisome for mild edema.    Discharge Exam: Blood pressure 120/72, pulse 61, temperature 98.2 F (36.8 C), temperature source  Oral, resp. rate 19, height 5\' 7"  (1.702 m), weight 74.072 kg (163 lb 4.8 oz), SpO2 100.00%.   General:alert and oriented, pleasant affect no acute distress  Heart:S1S2 irreg with premature beats  Lungs:clear without rales rhonchi or wheezes  Abd:+ BS, soft, non tender  Ext:no edema  Disposition: 01-Home or Self Care   Medication List  As of 06/05/2012  7:52 AM   TAKE these medications         amLODipine 10 MG tablet   Commonly known as: NORVASC   Take 1 tablet (10 mg total) by mouth daily.      aspirin 325 MG tablet   Take 325 mg by mouth daily.      furosemide 20 MG tablet   Commonly known as: LASIX   Take 1 tablet (20 mg total) by mouth daily.      isosorbide mononitrate 30 MG 24 hr tablet   Commonly known as: IMDUR   Take 1 tablet (30 mg total) by mouth daily.      lisinopril 40 MG tablet   Commonly known as: PRINIVIL,ZESTRIL   Take 1 tablet (40 mg total) by mouth daily.      metoprolol succinate 25 MG 24 hr tablet   Commonly known as: TOPROL-XL   Take 0.5 tablets (12.5 mg total) by mouth daily.      nitroGLYCERIN 0.4  MG SL tablet   Commonly known as: NITROSTAT   Place 1 tablet (0.4 mg total) under the tongue every 5 (five) minutes as needed for chest pain.      pantoprazole 40 MG tablet   Commonly known as: PROTONIX   Take 40 mg by mouth daily.      pravastatin 40 MG tablet   Commonly known as: PRAVACHOL   Take 1 tablet (40 mg total) by mouth daily.      spironolactone 25 MG tablet   Commonly known as: ALDACTONE   Take 0.5 tablets (12.5 mg total) by mouth 2 (two) times daily.      Travoprost (BAK Free) 0.004 % Soln ophthalmic solution   Commonly known as: TRAVATAN   Place 1 drop into both eyes at bedtime.           Follow-up Information    Follow up with Hillis Range, MD on 06/06/2012. (this will be procedure at [facility].  Be at [facility] Short Stay at  5:30 am)    Contact information:   [address] [place]  [address] [phone]       Follow up with Wilburt Finlay, PA on 06/14/2012. (at 2:00pm)    Contact information:   [address] [place] [address] [phone]       Follow up with Lennette Bihari, MD on 06/01/2012. (at 0730 AM  do not eat after midnight the night before the study)    Contact information:   [address] [place] [address] [phone]        Discharge Instructions:  Heart Healthy Diet Call if recurrent pain or questions.    SignedLeone Brand 06/05/2012, 7:52 AM

## 2012-06-06 ENCOUNTER — Encounter (HOSPITAL_COMMUNITY): Payer: Self-pay | Admitting: Certified Registered Nurse Anesthetist

## 2012-06-06 ENCOUNTER — Ambulatory Visit (HOSPITAL_COMMUNITY)
Admission: RE | Admit: 2012-06-06 | Discharge: 2012-06-07 | Disposition: A | Discharge status: Home / Self Care | Payer: Medicare Other | Source: Ambulatory Visit | Attending: Internal Medicine | Admitting: Internal Medicine

## 2012-06-06 ENCOUNTER — Ambulatory Visit (HOSPITAL_COMMUNITY): Payer: Medicare Other | Admitting: Certified Registered Nurse Anesthetist

## 2012-06-06 ENCOUNTER — Encounter (HOSPITAL_COMMUNITY)
Admission: RE | Disposition: A | Discharge status: Home / Self Care | Payer: Self-pay | Source: Ambulatory Visit | Attending: Internal Medicine

## 2012-06-06 DIAGNOSIS — I5022 Chronic systolic (congestive) heart failure: Secondary | ICD-10-CM | POA: Insufficient documentation

## 2012-06-06 DIAGNOSIS — I441 Atrioventricular block, second degree: Secondary | ICD-10-CM | POA: Diagnosis present

## 2012-06-06 DIAGNOSIS — Z8679 Personal history of other diseases of the circulatory system: Secondary | ICD-10-CM

## 2012-06-06 DIAGNOSIS — I428 Other cardiomyopathies: Secondary | ICD-10-CM | POA: Insufficient documentation

## 2012-06-06 DIAGNOSIS — I493 Ventricular premature depolarization: Secondary | ICD-10-CM | POA: Diagnosis present

## 2012-06-06 DIAGNOSIS — I5032 Chronic diastolic (congestive) heart failure: Secondary | ICD-10-CM | POA: Diagnosis present

## 2012-06-06 DIAGNOSIS — I509 Heart failure, unspecified: Secondary | ICD-10-CM | POA: Insufficient documentation

## 2012-06-06 DIAGNOSIS — N1832 Chronic kidney disease, stage 3b: Secondary | ICD-10-CM | POA: Diagnosis present

## 2012-06-06 DIAGNOSIS — K219 Gastro-esophageal reflux disease without esophagitis: Secondary | ICD-10-CM | POA: Insufficient documentation

## 2012-06-06 DIAGNOSIS — I429 Cardiomyopathy, unspecified: Secondary | ICD-10-CM

## 2012-06-06 DIAGNOSIS — I472 Ventricular tachycardia: Secondary | ICD-10-CM

## 2012-06-06 DIAGNOSIS — I5023 Acute on chronic systolic (congestive) heart failure: Secondary | ICD-10-CM

## 2012-06-06 DIAGNOSIS — I2510 Atherosclerotic heart disease of native coronary artery without angina pectoris: Secondary | ICD-10-CM

## 2012-06-06 DIAGNOSIS — I10 Essential (primary) hypertension: Secondary | ICD-10-CM | POA: Insufficient documentation

## 2012-06-06 DIAGNOSIS — I4949 Other premature depolarization: Secondary | ICD-10-CM | POA: Insufficient documentation

## 2012-06-06 HISTORY — PX: V-TACH ABLATION: SHX5498

## 2012-06-06 LAB — POCT ACTIVATED CLOTTING TIME
Activated Clotting Time: 209 seconds
Activated Clotting Time: 249 seconds
Activated Clotting Time: 189 seconds

## 2012-06-06 SURGERY — V-TACH ABLATION
Anesthesia: Monitor Anesthesia Care

## 2012-06-06 MED ORDER — HEPARIN SODIUM (PORCINE) 1000 UNIT/ML IJ SOLN
INTRAMUSCULAR | Status: AC
  Administered 2012-06-06 (×2): 5000 [IU] via INTRAVENOUS
  Filled 2012-06-06: qty 1

## 2012-06-06 MED ORDER — BUPIVACAINE HCL (PF) 0.25 % IJ SOLN
INTRAMUSCULAR | Status: AC
  Filled 2012-06-06: qty 60

## 2012-06-06 MED ORDER — SODIUM CHLORIDE 0.9 % IV SOLN: 250.0000 mL | INTRAVENOUS | Status: DC | PRN

## 2012-06-06 MED ORDER — HYDRALAZINE HCL 20 MG/ML IJ SOLN
INTRAMUSCULAR | Status: AC
  Filled 2012-06-06: qty 1

## 2012-06-06 MED ORDER — SODIUM CHLORIDE 0.9 % IJ SOLN: 3.0000 mL | INTRAMUSCULAR | Status: DC | PRN

## 2012-06-06 MED ORDER — SODIUM CHLORIDE 0.9 % IV SOLN
INTRAVENOUS | Status: DC
  Administered 2012-06-06: 07:00:00 via INTRAVENOUS

## 2012-06-06 MED ORDER — MIDAZOLAM HCL 5 MG/5ML IJ SOLN
INTRAMUSCULAR | Status: DC | PRN
  Administered 2012-06-06 (×2): 1 mg via INTRAVENOUS

## 2012-06-06 MED ORDER — SODIUM CHLORIDE 0.9 % IV SOLN
INTRAVENOUS | Status: DC | PRN
  Administered 2012-06-06: 08:00:00 via INTRAVENOUS

## 2012-06-06 MED ORDER — HYDRALAZINE HCL 20 MG/ML IJ SOLN
10.0000 mg | Freq: Once | INTRAMUSCULAR | Status: AC
  Administered 2012-06-06: 10 mg via INTRAVENOUS

## 2012-06-06 MED ORDER — SPIRONOLACTONE 12.5 MG HALF TABLET
12.5000 mg | ORAL_TABLET | Freq: Two times a day (BID) | ORAL | Status: DC
  Administered 2012-06-06 – 2012-06-07 (×2): 12.5 mg via ORAL
  Filled 2012-06-06 (×4): qty 1

## 2012-06-06 MED ORDER — DEXTROSE 5 % IV SOLN
INTRAVENOUS | Status: AC
  Filled 2012-06-06: qty 250

## 2012-06-06 MED ORDER — ISOSORBIDE MONONITRATE ER 30 MG PO TB24
30.0000 mg | ORAL_TABLET | Freq: Every day | ORAL | Status: DC
Start: 2012-06-06 — End: 2012-06-07
  Administered 2012-06-06 – 2012-06-07 (×2): 30 mg via ORAL
  Filled 2012-06-06 (×2): qty 1

## 2012-06-06 MED ORDER — SODIUM CHLORIDE 0.9 % IJ SOLN
3.0000 mL | Freq: Two times a day (BID) | INTRAMUSCULAR | Status: DC
  Administered 2012-06-06 (×2): 3 mL via INTRAVENOUS

## 2012-06-06 MED ORDER — HYDROCODONE-ACETAMINOPHEN 5-325 MG PO TABS
1.0000 | ORAL_TABLET | ORAL | Status: DC | PRN
  Administered 2012-06-06: 2 via ORAL
  Filled 2012-06-06: qty 2

## 2012-06-06 MED ORDER — AMLODIPINE BESYLATE 10 MG PO TABS
10.0000 mg | ORAL_TABLET | Freq: Every day | ORAL | Status: DC
  Administered 2012-06-06 – 2012-06-07 (×2): 10 mg via ORAL
  Filled 2012-06-06 (×2): qty 1

## 2012-06-06 MED ORDER — LISINOPRIL 40 MG PO TABS
40.0000 mg | ORAL_TABLET | Freq: Every day | ORAL | Status: DC
  Administered 2012-06-06 – 2012-06-07 (×2): 40 mg via ORAL
  Filled 2012-06-06 (×2): qty 1

## 2012-06-06 MED ORDER — ACETAMINOPHEN 325 MG PO TABS: 650.0000 mg | ORAL_TABLET | ORAL | Status: DC | PRN

## 2012-06-06 MED ORDER — ASPIRIN 325 MG PO TABS
325.0000 mg | ORAL_TABLET | Freq: Every day | ORAL | Status: DC
  Administered 2012-06-06 – 2012-06-07 (×2): 325 mg via ORAL
  Filled 2012-06-06 (×2): qty 1

## 2012-06-06 MED ORDER — TRAVOPROST (BAK FREE) 0.004 % OP SOLN
1.0000 [drp] | Freq: Every day | OPHTHALMIC | Status: DC
  Administered 2012-06-06: 1 [drp] via OPHTHALMIC
  Filled 2012-06-06: qty 2.5

## 2012-06-06 MED ORDER — HYDROXYUREA 500 MG PO CAPS
ORAL_CAPSULE | ORAL | Status: AC
  Filled 2012-06-06: qty 1

## 2012-06-06 MED ORDER — FUROSEMIDE 20 MG PO TABS
20.0000 mg | ORAL_TABLET | Freq: Every day | ORAL | Status: DC
  Administered 2012-06-06 – 2012-06-07 (×2): 20 mg via ORAL
  Filled 2012-06-06 (×2): qty 1

## 2012-06-06 MED ORDER — NITROGLYCERIN 0.4 MG SL SUBL
0.4000 mg | SUBLINGUAL_TABLET | SUBLINGUAL | Status: DC | PRN
Start: 2012-06-06 — End: 2012-06-07

## 2012-06-06 MED ORDER — PANTOPRAZOLE SODIUM 40 MG PO TBEC
40.0000 mg | DELAYED_RELEASE_TABLET | Freq: Every day | ORAL | Status: DC
Start: 2012-06-06 — End: 2012-06-07
  Administered 2012-06-06 – 2012-06-07 (×2): 40 mg via ORAL
  Filled 2012-06-06 (×2): qty 1

## 2012-06-06 MED ORDER — ONDANSETRON HCL 4 MG/2ML IJ SOLN: 4.0000 mg | Freq: Four times a day (QID) | INTRAMUSCULAR | Status: DC | PRN

## 2012-06-06 NOTE — Anesthesia Preprocedure Evaluation (Addendum)
Anesthesia Evaluation  Patient identified by MRN, date of birth, ID band Patient awake    Reviewed: Allergy & Precautions, H&P , NPO status , Patient's Chart, lab work & pertinent test results, reviewed documented beta blocker date and time   Airway Mallampati: I TM Distance: >3 FB Neck ROM: Full    Dental  (+) Edentulous Upper and Edentulous Lower   Pulmonary  breath sounds clear to auscultation        Cardiovascular hypertension, Pt. on medications + angina with exertion + CAD, + Past MI and +CHF + dysrhythmias Ventricular Tachycardia Rhythm:Regular Rate:Normal     Neuro/Psych    GI/Hepatic Neg liver ROS, GERD-  Medicated,  Endo/Other  negative endocrine ROS  Renal/GU negative Renal ROS     Musculoskeletal   Abdominal   Peds  Hematology   Anesthesia Other Findings   Reproductive/Obstetrics                       Anesthesia Physical Anesthesia Plan  ASA: III  Anesthesia Plan: MAC   Post-op Pain Management:    Induction: Intravenous  Airway Management Planned: Simple Face Mask  Additional Equipment:   Intra-op Plan:   Post-operative Plan:   Informed Consent: I have reviewed the patients History and Physical, chart, labs and discussed the procedure including the risks, benefits and alternatives for the proposed anesthesia with the patient or authorized representative who has indicated his/her understanding and acceptance.   Dental advisory given  Plan Discussed with: CRNA, Anesthesiologist and Surgeon  Anesthesia Plan Comments:        Anesthesia Quick Evaluation

## 2012-06-06 NOTE — Transfer of Care (Signed)
Immediate Anesthesia Transfer of Care Note  Patient: [name]  Procedure(s) Performed: Procedure(s) (LRB): V-TACH ABLATION (N/A)  Patient Location: Cath Lab  Anesthesia Type: MAC  Level of Consciousness: awake, alert , oriented and patient cooperative  Airway & Oxygen Therapy: Patient Spontanous Breathing  Post-op Assessment: Report given to PACU RN and Post -op Vital signs reviewed and stable  Post vital signs: Reviewed and stable  Complications: No apparent anesthesia complications

## 2012-06-06 NOTE — Progress Notes (Signed)
Pt c/o pain 9/10 of right groin cath site.  There is no new drainage on dressing.  Dr. Johney Frame notified and ordered to give Vicodin.  Will carry out MD orders and continue to monitor.

## 2012-06-06 NOTE — Brief Op Note (Signed)
06/06/2012  10:58 AM  PATIENT:  [name]  71 y.o. male  PRE-OPERATIVE DIAGNOSIS:  iPVC.  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   Delay start of Pharmacological VTE agent (>24hrs) due to surgical blood loss or risk of bleeding: yes

## 2012-06-06 NOTE — Progress Notes (Signed)
Utilization Review Completed.[employer] T7/16/2013   

## 2012-06-06 NOTE — H&P (View-Only) (Signed)
SUBJECTIVE: The patient is doing well today.  At this time, he denies chest pain, shortness of breath, or any new concerns.  He wants to go home.     Marland Kitchen amLODipine  10 mg Oral Daily  . aspirin  324 mg Oral Once  . aspirin  325 mg Oral Daily  . furosemide  20 mg Oral Daily  . heparin  5,000 Units Subcutaneous Q8H  . isosorbide mononitrate  30 mg Oral Daily  . losartan  50 mg Oral BID  . metoprolol succinate  12.5 mg Oral Daily  . pantoprazole  40 mg Oral Daily  . simvastatin  20 mg Oral q1800  . sodium chloride  3 mL Intravenous Q12H  . spironolactone  12.5 mg Oral BID  . Travoprost (BAK Free)  1 drop Both Eyes QHS      . sodium chloride      OBJECTIVE: Physical Exam: Filed Vitals:   05/29/12 1700 05/29/12 2148 05/30/12 0651 05/30/12 1340  BP: 150/64 159/69 139/77 120/72  Pulse: 67 57 70 61  Temp:  98.1 F (36.7 C) 98.1 F (36.7 C) 98.2 F (36.8 C)  TempSrc:  Oral Oral Oral  Resp: 18 18 18 19   Height:      Weight:   163 lb 4.8 oz (74.072 kg)   SpO2: 98% 99% 97% 100%    Intake/Output Summary (Last 24 hours) at 05/30/12 1523 Last data filed at 05/30/12 1244  Gross per 24 hour  Intake    978 ml  Output   2200 ml  Net  -1222 ml    Telemetry reveals sinus rhythm  GEN- The patient is well appearing, alert and oriented x 3 today.   Head- normocephalic, atraumatic Ears- hearing intact Oropharynx- clear Neck- supple, Lungs- Clear to ausculation bilaterally, normal work of breathing Heart- Regular rate and rhythm with frequent ectopy,   GI- soft, NT, ND, + BS Extremities- no clubbing, cyanosis, or edema Neuro- strength and sensation are intact  LABS: Basic Metabolic Panel:  Basename 05/29/12 0640  NA 139  K 4.0  CL 103  CO2 25  GLUCOSE 120*  BUN 19  CREATININE 1.61*  CALCIUM 9.9  MG 2.0  PHOS --     ASSESSMENT AND PLAN:  Active Problems:  CAD (coronary artery disease) 80% stenosis diag of the LAD, 30% in OM2 branch of LCX in 2009, no progression  this adm  Ventricular ectopy -history of bigeminy  Chronic systolic heart failure  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  I agree with Dr Graciela Husbands that this PVC focus is the probable cause for his decline in EF.  Unfortunately, our antiarrhythmic options are very limited.  I do not think that a pacemaker would provide significant benefit and I also do not think that with his frequent PVCs that we could effectively resynchronize his heart with CRT.  I agree with Dr Graciela Husbands that ablation is the best next step at this point.  Therapeutic strategies for ventricular tachycardia/ frequent PVCs including medicine and ablation were discussed in detail with the patient today. Risk, benefits, and alternatives to EP study and radiofrequency ablation were also discussed in detail today. These risks include but are not limited to stroke, bleeding, vascular damage, tamponade, perforation, damage to the heart and other structures, AV block requiring pacemaker, worsening  renal function, and death. The patient understands these risk and wishes to proceed.  We will therefore proceed with catheter ablation at the next available time.  No medicine changes are necessary from an EP standpoint.  OK to discharge to home.  Return 06/06/12 at 5:30am for EP study and ablation (my office will arrange).    Hillis Range, MD 05/30/2012 3:23 PM

## 2012-06-06 NOTE — Anesthesia Postprocedure Evaluation (Signed)
Anesthesia Post Note  Patient: [name]  Procedure(s) Performed: Procedure(s) (LRB): V-TACH ABLATION (N/A)  Anesthesia type: MAC  Patient location: PACU  Post pain: Pain level controlled  Post assessment: Patient's Cardiovascular Status Stable  Last Vitals:  Filed Vitals:   06/06/12 0546  BP: 177/86  Pulse: 59  Temp: 36.2 C  Resp: 18    Post vital signs: Reviewed and stable  Level of consciousness: sedated  Complications: No apparent anesthesia complications

## 2012-06-06 NOTE — Op Note (Signed)
NAMEDIRK, VANAMAN NO.:  [id number]  MEDICAL RECORD NO.:  [id number]  LOCATION:  4706                         FACILITY:  [facility]  PHYSICIAN:  Hillis Range, MD       DATE OF BIRTH:  [birth year]  DATE OF PROCEDURE: DATE OF DISCHARGE:                              OPERATIVE REPORT   SURGEON:  Hillis Range, MD  ASSISTANT:  Veverly Fells. Excell Seltzer, MD  PREPROCEDURE DIAGNOSIS:  Premature ventricular contractions.  POSTPROCEDURE DIAGNOSIS:  Premature ventricular contractions arising from the right coronary cusp of the aorta.  PROCEDURES: 1. Comprehensive EP study. 2. Coronary sinus recording and pacing. 3. Three-dimensional mapping of ventricular tachycardia. 4. Ablation of ventricular tachycardia. 5. Arterial blood pressure monitoring. 6. Selective coronary artery angiography.  INTRODUCTION:  Mr. Lerew is a pleasant 71 year old gentleman with a history of bradycardia and premature ventricular contractions in a bigeminal pattern.  He has a nonischemic cardiomyopathy, which is felt to be secondary to very frequent PVCs.  He therefore presents today for EP study and radiofrequency ablation.  DESCRIPTION OF PROCEDURE:  Informed written consent was obtained and the patient was brought to the Electrophysiology Lab in fasting state.  He was adequately sedated with intravenous Versed as outlined in the anesthesia report.  The patient's right and left groins were prepped and draped in the usual sterile fashion by the EP Lab staff.  Using a percutaneous Seldinger technique, one 6-French, one 7-French, and one 8- [place] hemostasis sheaths were placed into the right common femoral vein.  A 7-French Biosense Webster decapolar coronary sinus catheter was introduced through the right common femoral vein and advanced into the coronary sinus for recording and pacing from this location.  A 6-French quadripolar Josephson catheter was introduced through the right common femoral  vein and advanced into the right ventricle for recording and pacing.  This catheter was then pulled back to the His bundle location. The patient presented to the Electrophysiology Lab in sinus rhythm with PVCs in a bigeminal pattern.  The PVC focus was of a left bundle-branch inferior axis with a QRS duration of 148 milliseconds.  His PR interval measured 316 milliseconds with a QRS during sinus of 133 milliseconds and a QT interval of 465 milliseconds.  His AH interval measured 230 milliseconds with an HV interval of 55 milliseconds.  Ventricular pacing was performed, which revealed VA dissociation at baseline.  Atrial pacing was performed, which revealed an AV Wenckebach cycle length of 700 milliseconds.  The PVCs could not be suppressed with atrial pacing primarily because of AV Wenckebach.  I elected to perform mapping of the PVC focus.  A Biosense Webster 4-mm ablation catheter was therefore introduced through the right common femoral vein and advanced into the right ventricle.  Three-dimensional electroanatomical mapping of the PVC focus was performed with activation mapping along the right ventricular outflow tract using the CARTO mapping system.  The earliest ventricular activation was recorded along the anteroseptal right ventricular outflow tract.  In this location, however, the earliest activation proceeded the surface QRS by only 20 milliseconds.  Radiofrequency current was delivered in this location with a target temperature of 55 degrees at 40 watts for 120 seconds.  There was no significant effect on the PVC focus.  It was felt that the PVC may actually arise from the coronary cusp of the aorta as the transition along the precordial leads was at V3.  An 8-French hemostasis sheath was therefore placed into the right common femoral artery.  Arterial monitoring was then performed.  Through the right common femoral artery, the ablation catheter was advanced into the root of the  aorta.  Three-dimensional mapping was performed within all three cusps.  Activations within the left coronary cusp were very late.  Activations within the right coronary cusp, however, were early and earlier then the right ventricular outflow tract activations.  The earliest activation proceeded the surface QRS by 36 milliseconds in this location.  Careless activation, however, was very close to the ostium of the right coronary artery.  I called for the assistance of Dr. Calton Dach and he was very helpful in assisting with coronary angiography. A 5-French hemostasis sheath was placed into the left common femoral artery.  A 5-French JR4 catheter was introduced through the sheath and advanced into the right coronary artery.  Hand injection of nonionic contrast was performed, which revealed a patent proximal portion of the right coronary artery.  Only several mL of contrast were required for the procedure.  After we had located the exact anatomical location of the right coronary artery, additional mapping was then performed. Radiofrequency current was delivered within the right coronary cusp with a target temperature of 55 degrees at 40 watts for 120 seconds total duration.  There was a significant reduction in the PVC frequency following ablation.  The patient was observed.  He did have occasional PVCs, which were of the clinical morphology.  However, these were significantly reduced in their burden.  Due to the close proximity of the activation to the right coronary artery, I elected to perform no further ablation today.  Following ablation, the AH interval measured 222 millisecond with an HV interval of 52 milliseconds.  There were no early apparent complications.  CONCLUSIONS: 1. Sinus rhythm with PVCs in a bigeminal pattern arising from the     right coronary cusp of the aorta, successfully ablated in this     location.  Rare PVCs were observed following ablation; however,      additional radiofrequency current was not delivered due to the     close proximity of the PVC focus to the right coronary artery. 2. No early apparent complications.     Hillis Range, MD     JA/MEDQ  D:  06/06/2012  T:  06/06/2012  Job:  [id number]  cc:   Duke Salvia, MD, [facility] Thurmon Fair, MD

## 2012-06-06 NOTE — Progress Notes (Signed)
Pt admitted to room 4706.  Pt placed on tele and oriented to room.  Will carry out MD orders and continue to monitor.

## 2012-06-06 NOTE — Preoperative (Signed)
Beta Blockers   Reason not to administer Beta Blockers:Not Applicable 

## 2012-06-06 NOTE — Interval H&P Note (Signed)
History and Physical Interval Note:  06/06/2012 7:35 AM  [name]  has presented today for surgery, with the diagnosis of Multi PVC.  The various methods of treatment have been discussed with the patient and family. After consideration of risks, benefits and other options for treatment, the patient has consented to  Procedure(s) (LRB): V-TACH ABLATION (N/A) as a surgical intervention .  The patient's history has been reviewed, patient examined, no change in status, stable for surgery.  I have reviewed the patients' chart and labs.  Questions were answered to the patient's satisfaction.     Hillis Range

## 2012-06-07 DIAGNOSIS — Z9889 Other specified postprocedural states: Secondary | ICD-10-CM

## 2012-06-07 DIAGNOSIS — I2510 Atherosclerotic heart disease of native coronary artery without angina pectoris: Secondary | ICD-10-CM

## 2012-06-07 DIAGNOSIS — I5022 Chronic systolic (congestive) heart failure: Secondary | ICD-10-CM

## 2012-06-07 LAB — BASIC METABOLIC PANEL
BUN: 29 mg/dL — ABNORMAL HIGH (ref 6–23)
Calcium: 9.3 mg/dL (ref 8.4–10.5)
GFR calc non Af Amer: 21 mL/min — ABNORMAL LOW (ref >90)
Glucose, Bld: 142 mg/dL — ABNORMAL HIGH (ref 70–99)

## 2012-06-07 NOTE — Plan of Care (Signed)
Problem: Phase I Progression Outcomes Goal: Hemostasis of puncture sites Outcome: Progressing Bilateral groin sites are covered with gauze and transparent film.  No hematoma or active bleeding observed.  Pedal pulses remain strong.  Will continue to monitor throughout the shift.

## 2012-06-07 NOTE — Progress Notes (Signed)
[name] to be D/C'd Home per MD order.  Discussed with the patient and all questions fully answered.   [name], [name]  Home Medication Instructions ZOX:[id number]   Printed on:06/07/12 1034  Medication Information                    aspirin 325 MG tablet Take 325 mg by mouth daily.             Travoprost, BAK Free, (TRAVATAMN) 0.004 % SOLN ophthalmic solution Place 1 drop into both eyes at bedtime.            pantoprazole (PROTONIX) 40 MG tablet Take 40 mg by mouth daily.             amLODipine (NORVASC) 10 MG tablet Take 1 tablet (10 mg total) by mouth daily.           lisinopril (PRINIVIL,ZESTRIL) 40 MG tablet Take 1 tablet (40 mg total) by mouth daily.           pravastatin (PRAVACHOL) 40 MG tablet Take 1 tablet (40 mg total) by mouth daily.           furosemide (LASIX) 20 MG tablet Take 1 tablet (20 mg total) by mouth daily.           isosorbide mononitrate (IMDUR) 30 MG 24 hr tablet Take 1 tablet (30 mg total) by mouth daily.           metoprolol succinate (TOPROL-XL) 25 MG 24 hr tablet Take 0.5 tablets (12.5 mg total) by mouth daily.           nitroGLYCERIN (NITROSTAT) 0.4 MG SL tablet Place 1 tablet (0.4 mg total) under the tongue every 5 (five) minutes as needed for chest pain.           spironolactone (ALDACTONE) 25 MG tablet Take 0.5 tablets (12.5 mg total) by mouth 2 (two) times daily.           tiZANidine (ZANAFLEX) 4 MG tablet Take 4 mg by mouth every 6 (six) hours as needed. For muscle spasms             VVS, Skin clean, dry and intact without evidence of skin break down, no evidence of skin tears noted. IV catheter discontinued intact. Site without signs and symptoms of complications. Dressing and pressure applied.  An After Visit Summary was printed and given to the patient. Patient escorted via WC, and D/C home via private auto with family. [name] 06/07/2012 10:34 AM

## 2012-06-07 NOTE — Discharge Summary (Signed)
ELECTROPHYSIOLOGY DISCHARGE SUMMARY    Patient ID: [name],  MRN: [id number], DOB/AGE: [birth year] 71 y.o.  Admit date: 06/06/2012 Discharge date: 06/07/2012  Primary Care Physician: Clyda Greener, MD Primary Cardiologist: Nicki Guadalajara, MD  Primary Discharge Diagnosis:  1. 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 NICM, EF 35-40% 2. Chronic systolic CHF 3. Nonobstructive CAD 4. HTN 5. GERD  Procedures This Admission:  1. Comprehensive EP study and RF ablation of VT/PVC Conclusions: Sinus rhythm with PVCs in a bigeminal pattern arising from the right coronary cusp of the aorta, successfully ablated in this location. Rare PVCs were observed following ablation; however, additional radiofrequency current was not delivered due to the close proximity of the PVC focus to the right coronary artery. No early apparent complications.  History and Hospital Course:  [name] is a 71 year old man with nonobstructive CAD, nonischemic cardiomyopathy, hypertension, CHF and chronic kidney disease who was admitted 05/26/2012 for chest pain. MI was ruled out. He has known first degree AV block with a PR interval of about 300 ms and sinus rates in the 50s. While admitted, he was found to have intermittent second degree AV block, Wenckebach. His beta blocker dose was reduced. Telemetry also revealed frequent PVCs (30 PVCs per minute). He has baseline exercise intolerance with class II CHF symptoms. This is manifested by dyspnea on exertion as well as fatigue. He has some peripheral edema. He denies syncope. He does report occasional pre-syncope. These episodes occur with shortness of breath and "heart fluttering." He was admitted in January of this year for acute congestive heart failure. At that time, his EF was found to be 20% by cardiac catheterization. There was no new obstructive coronary disease. He was diuresed and sent home with a LifeVest because of newly  decreased EF. He wore his LifeVest for 3 months. His EF was reassessed by echo in April and was found to be improved at 35-40%. 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 Dr. Graciela Husbands and Dr. Johney Frame saw him in consultation and felt there was a correlation between his ventricular ectopy burden and his LV dysfunction and subsequent worsening CHF. Treatment options were discussed including AAD therapy, possibly with mexiletine, versus EPS with RF ablation. Mr. Jain elected the latter. On 06/06/2012, he underwent EP study with RF ablation. Please see conclusions outlined above. The patient tolerated this procedure well without any immediate complication. He remains hemodynamically stable and afebrile. His groin site is intact without significant bleeding or hematoma. He has been given discharge instructions including wound care and activity restrictions. He will follow-up with Dr. Johney Frame in 6 weeks. There were no changes made to his medications. He has been seen, examined and deemed stable for discharge today by Dr. Hillis Range.   Physical Exam:  Vitals: BP 110/59, pulse 79, temp 98 F (36.7 C), resp 18, height 5\' 8"  (1.727 m), weight 161 lb 14.4 oz (73.437 kg), SpO2 99%  General: Well developed, well appearing 71 year old male in no acute distress. Heart: RRR. S1, S2 present without M/R/S3 Lungs: CTA bilaterally.  Extremities: No cyanosis, clubbing or edema. Pedal pulses intact and equal. Groin site intact without significant bleeding or hematoma.  Labs: Lab Results  Component Value Date   WBC 5.0 05/27/2012   HGB 15.0 05/27/2012   HCT 42.8 05/27/2012   MCV 85.3 05/27/2012   PLT 167 05/27/2012  Lab 06/07/12 0645  NA 139  K 4.4  CL 103  CO2 22  BUN 29*  CREATININE 2.84*  CALCIUM 9.3  PROT --  BILITOT --  ALKPHOS --  ALT --  AST --  GLUCOSE 142*    Disposition:  The patient is being  discharged in stable condition.  Follow-up: Follow-up Information    Follow up with Hillis Range, MD on 07/21/2012. (At 10:15 AM)    Contact information:   [address] [place] [address] [phone]   Discharge Medications:  Medication List  As of 06/07/2012  9:14 AM   TAKE these medications         amLODipine 10 MG tablet   Commonly known as: NORVASC   Take 1 tablet (10 mg total) by mouth daily.      aspirin 325 MG tablet   Take 325 mg by mouth daily.      furosemide 20 MG tablet   Commonly known as: LASIX   Take 1 tablet (20 mg total) by mouth daily.      isosorbide mononitrate 30 MG 24 hr tablet   Commonly known as: IMDUR   Take 1 tablet (30 mg total) by mouth daily.      lisinopril 40 MG tablet   Commonly known as: PRINIVIL,ZESTRIL   Take 1 tablet (40 mg total) by mouth daily.      metoprolol succinate 25 MG 24 hr tablet   Commonly known as: TOPROL-XL   Take 0.5 tablets (12.5 mg total) by mouth daily.      nitroGLYCERIN 0.4 MG SL tablet   Commonly known as: NITROSTAT   Place 1 tablet (0.4 mg total) under the tongue every 5 (five) minutes as needed for chest pain.      pantoprazole 40 MG tablet   Commonly known as: PROTONIX   Take 40 mg by mouth daily.      pravastatin 40 MG tablet   Commonly known as: PRAVACHOL   Take 1 tablet (40 mg total) by mouth daily.      spironolactone 25 MG tablet   Commonly known as: ALDACTONE   Take 0.5 tablets (12.5 mg total) by mouth 2 (two) times daily.      tiZANidine 4 MG tablet   Commonly known as: ZANAFLEX   Take 4 mg by mouth every 6 (six) hours as needed. For muscle spasms      Travoprost (BAK Free) 0.004 % Soln ophthalmic solution   Commonly known as: TRAVATAN   Place 1 drop into both eyes at bedtime.      Duration of Discharge Encounter: Greater than 30 minutes including physician time.  Signed, Rick Duff, PA-C 06/07/2012, 9:14 AM    I have seen, examined the patient, and  reviewed the above assessment and plan.  Changes to above are made where necessary.    Co Sign: Hillis Range, MD 06/07/2012 1:53 PM

## 2012-06-07 NOTE — Plan of Care (Signed)
Problem: Phase I Progression Outcomes Goal: Initial discharge plan identified Outcome: Completed/Met Date Met:  06/07/12 Patient is to discharge home on 06/07/12 if stable.

## 2012-07-21 ENCOUNTER — Ambulatory Visit: Payer: Medicare Other | Admitting: Internal Medicine

## 2012-07-31 ENCOUNTER — Encounter: Payer: Self-pay | Admitting: Internal Medicine

## 2012-07-31 ENCOUNTER — Ambulatory Visit (INDEPENDENT_AMBULATORY_CARE_PROVIDER_SITE_OTHER): Payer: Medicare Other | Admitting: Internal Medicine

## 2012-07-31 VITALS — BP 146/70 | HR 51 | Ht 68.0 in | Wt 171.2 lb

## 2012-07-31 DIAGNOSIS — I4949 Other premature depolarization: Secondary | ICD-10-CM

## 2012-07-31 DIAGNOSIS — I441 Atrioventricular block, second degree: Secondary | ICD-10-CM

## 2012-07-31 DIAGNOSIS — I440 Atrioventricular block, first degree: Secondary | ICD-10-CM

## 2012-07-31 DIAGNOSIS — I5022 Chronic systolic (congestive) heart failure: Secondary | ICD-10-CM

## 2012-07-31 DIAGNOSIS — I493 Ventricular premature depolarization: Secondary | ICD-10-CM

## 2012-07-31 MED ORDER — METOPROLOL SUCCINATE ER 25 MG PO TB24: ORAL_TABLET | ORAL | Status: DC

## 2012-07-31 NOTE — Assessment & Plan Note (Signed)
Possibly related to high PVC burden Will suppress PVCs as above and reassess EF once his PVCs are treated

## 2012-07-31 NOTE — Progress Notes (Signed)
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  Since his recent PVC ablation, the patient reports doing reasonably well.  He feels that his exercise tolerance is stable.  He denies procedure related complications.  Today, he denies symptoms of palpitations, chest pain, shortness of breath,  lower extremity edema, or syncope.  He did have recent dizziness for which he was evaluated by Dr Bruna Potter and felt to have bradycardia.  The patient is otherwise without complaint today.   Past Medical History  Diagnosis Date  . Colon polyp, hyperplastic   . Spondylolisthesis   . Hypertension   . CHF (congestive heart failure)   . Unstable angina 11/22/2011  . Respiratory failure, acute 11/22/2011  . CAD (coronary artery disease) 80% stenosis diag of the LAD, 30% in OM2 branch of LCX in 2009 11/22/2011  . Accelerated hypertension 11/22/2011  . Unstable angina 05/26/2012  . LV dysfunction, hx of EF 20% but most recent Echo 03/14/12 EF 35 -40%, life vest discontinued 05/26/2012  . Nonischemic cardiomyopathy   . Ventricular ectopy Hx of PVCs 11/22/2011  . 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 Myocardial infarction 11/22/11  . Acute bronchitis 05/26/12  . Exertional dyspnea   . Legally blind     "both eyes"  . 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 CKD (chronic kidney disease) stage 3, GFR 30-59 ml/min 05/27/2012  . Acute on chronic systolic CHF (congestive heart failure) 06/05/2012   Past Surgical History  Procedure Date  . Cystoscopy   . Rotator cuff repair 2000's    left  . Back surgery   . Cardiac catheterization   . Posterior fusion lumbar spine 1979  . Cataract extraction, bilateral 1990's  . Ep study and ablation of vt 7/13    PVC focus mapped to the right coronary cusp of the aorta, limited ablation performed due to proximity of the focus  to the right coronary artery    Current Outpatient Prescriptions  Medication Sig Dispense Refill  . amLODipine (NORVASC) 10 MG tablet Take 1 tablet (10 mg total) by mouth daily.  30 tablet  11  . aspirin 325 MG tablet Take 325 mg by mouth daily.        . furosemide (LASIX) 20 MG tablet Take 1 tablet (20 mg total) by mouth daily.  30 tablet  11  . metoprolol succinate (TOPROL-XL) 25 MG 24 hr tablet Take one tablet by mouth daily  30 tablet  11  . nitroGLYCERIN (NITROSTAT) 0.4 MG SL tablet Place 1 tablet (0.4 mg total) under the tongue every 5 (five) minutes as needed for chest pain.  25 tablet  4  . pantoprazole (PROTONIX) 40 MG tablet Take 40 mg by mouth daily.        Marland Kitchen tiZANidine (ZANAFLEX) 4 MG tablet Take 4 mg by mouth 3 (three) times daily as needed. For muscle spasms      . DISCONTD: metoprolol succinate (TOPROL-XL) 25 MG 24 hr tablet Take 0.5 tablets (12.5 mg total) by mouth daily.  18 tablet  5  . DISCONTD: metoprolol succinate (TOPROL-XL) 25 MG 24 hr tablet Take 50 mg by mouth daily. Take 1 & 1/2 tablet my mouth daily.        Physical Exam: Filed Vitals:   07/31/12 1538  BP: 146/70  Pulse: 51  Height: 5\' 8"  (1.727  m)  Weight: 171 lb 3.2 oz (77.656 kg)    GEN- The patient is well appearing, alert and oriented x 3 today.   Head- normocephalic, atraumatic Eyes-  Sclera clear, conjunctiva pink Ears- hearing intact Oropharynx- clear Lungs- Clear to ausculation bilaterally, normal work of breathing Heart- Regular rate and rhythm with frequent ectopy, no murmurs, rubs or gallops, PMI not laterally displaced GI- soft, NT, ND, + BS Extremities- no clubbing, cyanosis, or edema  ekg today reveals sinus rhythm with first degree AV block (PR 330) and PVCs in bigeminy  Assessment and Plan:

## 2012-07-31 NOTE — Patient Instructions (Signed)
Your physician recommends that you schedule a follow-up appointment in: 2 weeks with Dr Johney Frame  Your physician has recommended that you wear a holter monitor. Holter monitors are medical devices that record the heart's electrical activity. Doctors most often use these monitors to diagnose arrhythmias. Arrhythmias are problems with the speed or rhythm of the heartbeat. The monitor is a small, portable device. You can wear one while you do your normal daily activities. This is usually used to diagnose what is causing palpitations/syncope (passing out).  Your physician has recommended you make the following change in your medication:  1) Decrease Toprol to 50mg  daily

## 2012-07-31 NOTE — Assessment & Plan Note (Signed)
He has a long first degree AV block I will decrease metoprolol to 50mg  daily 24 hour holter to better assess for AV block given recent dizziness  Return in 2 weeks for follow-up

## 2012-07-31 NOTE — Assessment & Plan Note (Signed)
Doing well s/p ablation.  Unfortunately, ablation therapy was limited as the PVC focus was found to arise from the right coronary cusp of the aorta near the right coronary artery.  We will repeat a 24 hour holter at this time to further assess his PVC burden. If his PVC burden remains high then I will likely place him on amiodarone in order to suppress his PVCs in hopes that his EF will normalize.

## 2012-08-09 ENCOUNTER — Encounter (INDEPENDENT_AMBULATORY_CARE_PROVIDER_SITE_OTHER): Payer: Medicare Other

## 2012-08-09 DIAGNOSIS — I441 Atrioventricular block, second degree: Secondary | ICD-10-CM

## 2012-08-21 ENCOUNTER — Encounter: Payer: Self-pay | Admitting: Internal Medicine

## 2012-08-21 ENCOUNTER — Ambulatory Visit (INDEPENDENT_AMBULATORY_CARE_PROVIDER_SITE_OTHER): Payer: Medicare Other | Admitting: Internal Medicine

## 2012-08-21 VITALS — BP 134/68 | HR 54 | Ht 68.0 in | Wt 172.0 lb

## 2012-08-21 DIAGNOSIS — I509 Heart failure, unspecified: Secondary | ICD-10-CM

## 2012-08-21 DIAGNOSIS — R5383 Other fatigue: Secondary | ICD-10-CM

## 2012-08-21 DIAGNOSIS — I25810 Atherosclerosis of coronary artery bypass graft(s) without angina pectoris: Secondary | ICD-10-CM

## 2012-08-21 DIAGNOSIS — I429 Cardiomyopathy, unspecified: Secondary | ICD-10-CM

## 2012-08-21 DIAGNOSIS — R5381 Other malaise: Secondary | ICD-10-CM

## 2012-08-21 DIAGNOSIS — I4949 Other premature depolarization: Secondary | ICD-10-CM

## 2012-08-21 DIAGNOSIS — I493 Ventricular premature depolarization: Secondary | ICD-10-CM

## 2012-08-21 LAB — HEPATIC FUNCTION PANEL
AST: 22 U/L (ref 0–37)
Albumin: 3.8 g/dL (ref 3.5–5.2)
Total Bilirubin: 0.6 mg/dL (ref 0.3–1.2)

## 2012-08-21 LAB — T4, FREE: Free T4: 0.87 ng/dL (ref 0.60–1.60)

## 2012-08-21 LAB — BASIC METABOLIC PANEL
BUN: 16 mg/dL (ref 6–23)
Calcium: 9.3 mg/dL (ref 8.4–10.5)
Creatinine, Ser: 1.6 mg/dL — ABNORMAL HIGH (ref 0.4–1.5)
GFR: 56.29 mL/min — ABNORMAL LOW (ref >60.00)
Glucose, Bld: 100 mg/dL — ABNORMAL HIGH (ref 70–99)
Potassium: 4.2 mEq/L (ref 3.5–5.1)

## 2012-08-21 LAB — TSH: TSH: 0.98 u[IU]/mL (ref 0.35–5.50)

## 2012-08-21 MED ORDER — AMIODARONE HCL 200 MG PO TABS: ORAL_TABLET | ORAL | Status: DC

## 2012-08-21 NOTE — Progress Notes (Signed)
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  Since his last visit, the patient reports doing reasonably well.  He feels that his exercise tolerance is stable.  He has only rare palpitations.  Today, he denies symptoms of chest pain, shortness of breath,  lower extremity edema, or syncope.   He denies any further symptoms of bradycardia.  The patient is otherwise without complaint today.   Past Medical History  Diagnosis Date  . Colon polyp, hyperplastic   . Spondylolisthesis   . Hypertension   . CHF (congestive heart failure)   . Unstable angina 11/22/2011  . Respiratory failure, acute 11/22/2011  . CAD (coronary artery disease) 80% stenosis diag of the LAD, 30% in OM2 branch of LCX in 2009 11/22/2011  . Accelerated hypertension 11/22/2011  . Unstable angina 05/26/2012  . LV dysfunction, hx of EF 20% but most recent Echo 03/14/12 EF 35 -40%, life vest discontinued 05/26/2012  . Nonischemic cardiomyopathy   . Ventricular ectopy Hx of PVCs 11/22/2011  . 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 Myocardial infarction 11/22/11  . Acute bronchitis 05/26/12  . Exertional dyspnea   . Legally blind     "both eyes"  . 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 CKD (chronic kidney disease) stage 3, GFR 30-59 ml/min 05/27/2012  . Acute on chronic systolic CHF (congestive heart failure) 06/05/2012   Past Surgical History  Procedure Date  . Cystoscopy   . Rotator cuff repair 2000's    left  . Back surgery   . Cardiac catheterization   . Posterior fusion lumbar spine 1979  . Cataract extraction, bilateral 1990's  . Ep study and ablation of vt 7/13    PVC focus mapped to the right coronary cusp of the aorta, limited ablation performed due to proximity of the focus to the right coronary artery    Current Outpatient Prescriptions  Medication Sig  Dispense Refill  . amLODipine (NORVASC) 10 MG tablet Take 1 tablet (10 mg total) by mouth daily.  30 tablet  11  . aspirin 325 MG tablet Take 325 mg by mouth daily.        . furosemide (LASIX) 20 MG tablet Take 1 tablet (20 mg total) by mouth daily.  30 tablet  11  . metoprolol succinate (TOPROL-XL) 25 MG 24 hr tablet Take one tablet by mouth daily  30 tablet  11  . nitroGLYCERIN (NITROSTAT) 0.4 MG SL tablet Place 1 tablet (0.4 mg total) under the tongue every 5 (five) minutes as needed for chest pain.  25 tablet  4  . pantoprazole (PROTONIX) 40 MG tablet Take 40 mg by mouth daily.        Marland Kitchen tiZANidine (ZANAFLEX) 4 MG tablet Take 4 mg by mouth 3 (three) times daily as needed. For muscle spasms      . amiodarone (PACERONE) 200 MG tablet Take 400 mg three times a day for 1 week, then 400 mg twice a day for 1 week, then 200 mg twice daily till seen again  100 tablet  1    Physical Exam: Filed Vitals:   08/21/12 1046  BP: 134/68  Pulse: 54  Height: 5\' 8"  (1.727 m)  Weight: 172 lb (78.019 kg)  SpO2: 100%    GEN- The patient is well appearing, alert and oriented x 3 today.  Head- normocephalic, atraumatic Eyes-  Sclera clear, conjunctiva pink Ears- hearing intact Oropharynx- clear Lungs- Clear to ausculation bilaterally, normal work of breathing Heart- Regular rate and rhythm with frequent ectopy, no murmurs, rubs or gallops, PMI not laterally displaced GI- soft, NT, ND, + BS Extremities- no clubbing, cyanosis, or edema  Holter (48 hours) reveals sinus rhythm with frequent PVCs (21,308).  HR range is 53-97 bpm (average 67 bpm)  Assessment and Plan:

## 2012-08-21 NOTE — Assessment & Plan Note (Signed)
Unfortunately, he continues to have frequent PVCs s/p ablation.  Ablation was limited by the proximity of the PVC focus to the R coronary artery. At this time, I will initiate amiodarone to suppress PVCs to see if his EF recovers.  IF he has significant improvement in EF with PVC suppression then we will have to determine whether to continue amiodarone or repeat ablation as our long term option. Risks, benefits, and alternatives to amiodarone therapy (including otic, pulmonary, liver, and thyroid toxicity) were discussed at length with the patient who wishes to proceed. We will start amiodarone 400mg  TID x 7 days then 400mg  BID x 7 days then 200mg  bid until I see him again in 6 weeks. We will check electrolytes, LFTs and TFTs today prior to starting amiodarone therapy. He will contact me immediately if problems arise.

## 2012-08-21 NOTE — Patient Instructions (Signed)
Your physician recommends that you schedule a follow-up appointment in: 6 weeks with Dr Johney Frame  Your physician recommends that you return for lab work drawn today ( BMP, Liver function, TSH, T4)  Your physician has recommended you make the following change in your medication: START Amiodarone 200 mg ---- 2 pills three times a day for 1 week then 2 pills twice a day for 1 week then 1 pill twice a day till return visit

## 2012-08-21 NOTE — Assessment & Plan Note (Signed)
As above He will continue to follow closely with Dr Tresa Endo also

## 2012-09-18 ENCOUNTER — Encounter: Payer: Self-pay | Admitting: Internal Medicine

## 2012-09-18 ENCOUNTER — Ambulatory Visit (INDEPENDENT_AMBULATORY_CARE_PROVIDER_SITE_OTHER): Payer: Medicare Other | Admitting: Internal Medicine

## 2012-09-18 VITALS — BP 132/76 | HR 55 | Ht 68.0 in | Wt 171.0 lb

## 2012-09-18 DIAGNOSIS — I4949 Other premature depolarization: Secondary | ICD-10-CM

## 2012-09-18 DIAGNOSIS — I5022 Chronic systolic (congestive) heart failure: Secondary | ICD-10-CM

## 2012-09-18 DIAGNOSIS — I493 Ventricular premature depolarization: Secondary | ICD-10-CM

## 2012-09-18 NOTE — Patient Instructions (Signed)
Your physician recommends that you schedule a follow-up appointment in: 2 months with Dr Allred  

## 2012-09-18 NOTE — Assessment & Plan Note (Signed)
Much improved with amiodarone Continue amiodarone 200mg  daily Return in 2 months for further assessment We may repeat 24 hour holter at that time to assess PVCs on amiodarone

## 2012-09-18 NOTE — Assessment & Plan Note (Signed)
Consider echo once we know that PVCs are adequately suppressed

## 2012-09-18 NOTE — Progress Notes (Signed)
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  Since his last visit, the patient reports doing reasonably well.  He feels that his exercise tolerance is stable.  He has only rare palpitations and SOB.   He also reports rare postural dizziness.  Today, he denies symptoms of chest pain, lower extremity edema, or syncope.   He denies any further symptoms of bradycardia.  The patient is otherwise without complaint today.   Past Medical History  Diagnosis Date  . Colon polyp, hyperplastic   . Spondylolisthesis   . Hypertension   . CHF (congestive heart failure)   . Unstable angina 11/22/2011  . Respiratory failure, acute 11/22/2011  . CAD (coronary artery disease) 80% stenosis diag of the LAD, 30% in OM2 branch of LCX in 2009 11/22/2011  . Accelerated hypertension 11/22/2011  . Unstable angina 05/26/2012  . LV dysfunction, hx of EF 20% but most recent Echo 03/14/12 EF 35 -40%, life vest discontinued 05/26/2012  . Nonischemic cardiomyopathy   . Ventricular ectopy Hx of PVCs 11/22/2011  . 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 Myocardial infarction 11/22/11  . Acute bronchitis 05/26/12  . Exertional dyspnea   . Legally blind     "both eyes"  . 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 CKD (chronic kidney disease) stage 3, GFR 30-59 ml/min 05/27/2012  . Acute on chronic systolic CHF (congestive heart failure) 06/05/2012   Past Surgical History  Procedure Date  . Cystoscopy   . Rotator cuff repair 2000's    left  . Back surgery   . Cardiac catheterization   . Posterior fusion lumbar spine 1979  . Cataract extraction, bilateral 1990's  . Ep study and ablation of vt 7/13    PVC focus mapped to the right coronary cusp of the aorta, limited ablation performed due to proximity of the focus to the right coronary artery    Current Outpatient  Prescriptions  Medication Sig Dispense Refill  . amiodarone (PACERONE) 200 MG tablet Take 400 mg three times a day for 1 week, then 400 mg twice a day for 1 week, then 200 mg twice daily till seen again  100 tablet  1  . amLODipine (NORVASC) 10 MG tablet Take 1 tablet (10 mg total) by mouth daily.  30 tablet  11  . aspirin 325 MG tablet Take 325 mg by mouth daily.        . furosemide (LASIX) 20 MG tablet Take 1 tablet (20 mg total) by mouth daily.  30 tablet  11  . metoprolol succinate (TOPROL-XL) 25 MG 24 hr tablet Take one tablet by mouth daily  30 tablet  11  . nitroGLYCERIN (NITROSTAT) 0.4 MG SL tablet Place 1 tablet (0.4 mg total) under the tongue every 5 (five) minutes as needed for chest pain.  25 tablet  4  . pantoprazole (PROTONIX) 40 MG tablet Take 40 mg by mouth daily.        Marland Kitchen tiZANidine (ZANAFLEX) 4 MG tablet Take 4 mg by mouth 3 (three) times daily as needed. For muscle spasms        Physical Exam: Filed Vitals:   09/18/12 1141  BP: 132/76  Pulse: 55  Height: 5\' 8"  (1.727 m)  Weight: 171 lb (77.565 kg)    GEN- The patient is well appearing, alert and oriented x 3  today.   Head- normocephalic, atraumatic Eyes-  Sclera clear, conjunctiva pink Ears- hearing intact Oropharynx- clear Lungs- Clear to ausculation bilaterally, normal work of breathing Heart- Regular rate and rhythm with frequent ectopy, no murmurs, rubs or gallops, PMI not laterally displaced GI- soft, NT, ND, + BS Extremities- no clubbing, cyanosis, or edema  ekg today reveals sinus 54 bpm, PR 378, nonspecific ST/T changes  Assessment and Plan:

## 2012-09-28 ENCOUNTER — Ambulatory Visit: Payer: Medicare Other | Admitting: Internal Medicine

## 2012-10-17 ENCOUNTER — Other Ambulatory Visit: Payer: Self-pay | Admitting: *Deleted

## 2012-10-17 MED ORDER — AMIODARONE HCL 200 MG PO TABS: 200.0000 mg | ORAL_TABLET | Freq: Two times a day (BID) | ORAL | Status: DC

## 2012-11-27 ENCOUNTER — Encounter: Payer: Self-pay | Admitting: Internal Medicine

## 2012-11-27 ENCOUNTER — Ambulatory Visit (INDEPENDENT_AMBULATORY_CARE_PROVIDER_SITE_OTHER): Payer: Medicare Other | Admitting: Internal Medicine

## 2012-11-27 VITALS — BP 116/64 | HR 54 | Ht 68.0 in | Wt 176.0 lb

## 2012-11-27 DIAGNOSIS — I5022 Chronic systolic (congestive) heart failure: Secondary | ICD-10-CM

## 2012-11-27 DIAGNOSIS — I4949 Other premature depolarization: Secondary | ICD-10-CM

## 2012-11-27 DIAGNOSIS — I493 Ventricular premature depolarization: Secondary | ICD-10-CM

## 2012-11-27 DIAGNOSIS — I4891 Unspecified atrial fibrillation: Secondary | ICD-10-CM

## 2012-11-27 DIAGNOSIS — I440 Atrioventricular block, first degree: Secondary | ICD-10-CM

## 2012-11-27 LAB — BASIC METABOLIC PANEL
BUN: 20 mg/dL (ref 6–23)
GFR: 39.69 mL/min — ABNORMAL LOW (ref >60.00)
Potassium: 3.8 mEq/L (ref 3.5–5.1)

## 2012-11-27 LAB — HEPATIC FUNCTION PANEL
AST: 22 U/L (ref 0–37)
Bilirubin, Direct: 0 mg/dL (ref 0.0–0.3)
Total Bilirubin: 0.7 mg/dL (ref 0.3–1.2)

## 2012-11-27 LAB — T4, FREE: Free T4: 1.25 ng/dL (ref 0.60–1.60)

## 2012-11-27 LAB — TSH: TSH: 1.22 u[IU]/mL (ref 0.35–5.50)

## 2012-11-27 MED ORDER — AMIODARONE HCL 200 MG PO TABS: 200.0000 mg | ORAL_TABLET | Freq: Every day | ORAL | Status: DC

## 2012-11-27 NOTE — Assessment & Plan Note (Signed)
Stable long first degree AV block Reassess with 48 hour holter  At prior ep study, AH was and HV was 55 msec.  No symptoms of av block Will follow

## 2012-11-27 NOTE — Progress Notes (Signed)
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  Since his last visit, the patient reports doing very well.  He feels that his exercise tolerance is much improved.  His palpitations and SOB have resolved.  Today, he denies symptoms of chest pain, lower extremity edema, or syncope.   He denies any further symptoms of bradycardia.  The patient is otherwise without complaint today.   Past Medical History  Diagnosis Date  . Colon polyp, hyperplastic   . Spondylolisthesis   . Hypertension   . CHF (congestive heart failure)   . Unstable angina 11/22/2011  . Respiratory failure, acute 11/22/2011  . CAD (coronary artery disease) 80% stenosis diag of the LAD, 30% in OM2 branch of LCX in 2009 11/22/2011  . Accelerated hypertension 11/22/2011  . Unstable angina 05/26/2012  . LV dysfunction, hx of EF 20% but most recent Echo 03/14/12 EF 35 -40%, life vest discontinued 05/26/2012  . Nonischemic cardiomyopathy   . Ventricular ectopy Hx of PVCs 11/22/2011  . 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 Myocardial infarction 11/22/11  . Acute bronchitis 05/26/12  . Exertional dyspnea   . Legally blind     "both eyes"  . 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 CKD (chronic kidney disease) stage 3, GFR 30-59 ml/min 05/27/2012  . Acute on chronic systolic CHF (congestive heart failure) 06/05/2012   Past Surgical History  Procedure Date  . Cystoscopy   . Rotator cuff repair 2000's    left  . Back surgery   . Cardiac catheterization   . Posterior fusion lumbar spine 1979  . Cataract extraction, bilateral 1990's  . Ep study and ablation of vt 7/13    PVC focus mapped to the right coronary cusp of the aorta, limited ablation performed due to proximity of the focus to the right coronary artery    Current Outpatient Prescriptions  Medication Sig Dispense  Refill  . amiodarone (PACERONE) 200 MG tablet Take 1 tablet (200 mg total) by mouth 2 (two) times daily.  60 tablet  1  . amLODipine (NORVASC) 10 MG tablet Take 1 tablet (10 mg total) by mouth daily.  30 tablet  11  . aspirin 325 MG tablet Take 325 mg by mouth daily.        . furosemide (LASIX) 20 MG tablet Take 1 tablet (20 mg total) by mouth daily.  30 tablet  11  . metoprolol succinate (TOPROL-XL) 25 MG 24 hr tablet Take one tablet by mouth daily  30 tablet  11  . nitroGLYCERIN (NITROSTAT) 0.4 MG SL tablet Place 1 tablet (0.4 mg total) under the tongue every 5 (five) minutes as needed for chest pain.  25 tablet  4  . pantoprazole (PROTONIX) 40 MG tablet Take 40 mg by mouth daily.        Marland Kitchen tiZANidine (ZANAFLEX) 4 MG tablet Take 4 mg by mouth 3 (three) times daily as needed. For muscle spasms        Physical Exam: Filed Vitals:   11/27/12 1055  BP: 116/64  Pulse: 54  Height: 5\' 8"  (1.727 m)  Weight: 176 lb (79.833 kg)  SpO2: 98%    GEN- The patient is well appearing, alert and oriented x 3 today.   Head- normocephalic, atraumatic Eyes-  Sclera clear, conjunctiva pink Ears- hearing intact Oropharynx- clear Lungs- Clear to  ausculation bilaterally, normal work of breathing Heart- Regular rate and rhythm, no murmurs, rubs or gallops, PMI not laterally displaced GI- soft, NT, ND, + BS Extremities- no clubbing, cyanosis, or edema  ekg today reveals sinus 54 bpm, PR 360, nonspecific ST/T changes  Assessment and Plan:

## 2012-11-27 NOTE — Assessment & Plan Note (Signed)
Clinically much improved with amiodarone Check LFTs/TFTs today Continue amiodarone 200mg  daily  Repeat 48 hour holter to assess PVC burden on amiodarone

## 2012-11-27 NOTE — Assessment & Plan Note (Signed)
If PVCs are suppressed, will ask Dr Tresa Endo to repeat an echo

## 2012-11-27 NOTE — Patient Instructions (Signed)
Your physician recommends that you schedule a follow-up appointment in: 6 weeks with DrAllred  Your physician has recommended that you wear a holter monitor. Holter monitors are medical devices that record the heart's electrical activity. Doctors most often use these monitors to diagnose arrhythmias. Arrhythmias are problems with the speed or rhythm of the heartbeat. The monitor is a small, portable device. You can wear one while you do your normal daily activities. This is usually used to diagnose what is causing palpitations/syncope (passing out).--48 hour  Your physician recommends that you return for lab work today--TSH/T4/BMP/LIVER/  Your physician has recommended you make the following change in your medication:  1)Decrease Amiodarone to 200mg  daily

## 2012-11-30 ENCOUNTER — Telehealth: Payer: Self-pay | Admitting: Internal Medicine

## 2012-11-30 NOTE — Telephone Encounter (Signed)
New Problem:    Called in needing an ok form our office for the patient to be taking amiodarone (PACERONE) 200 MG tablet and metoprolol succinate (TOPROL-XL) 25 MG 24 hr tablet because there could possibly be an interaction between the drugs.  Please call back.

## 2012-11-30 NOTE — Telephone Encounter (Signed)
Spoke to pharmacist at [facility] was told Dr.Allred prescribed amiodarone and metoprolol.He is aware patient is on both.

## 2012-12-05 ENCOUNTER — Encounter (INDEPENDENT_AMBULATORY_CARE_PROVIDER_SITE_OTHER): Payer: Medicare Other

## 2012-12-05 DIAGNOSIS — I493 Ventricular premature depolarization: Secondary | ICD-10-CM

## 2012-12-05 DIAGNOSIS — I4949 Other premature depolarization: Secondary | ICD-10-CM

## 2012-12-05 DIAGNOSIS — I440 Atrioventricular block, first degree: Secondary | ICD-10-CM

## 2012-12-05 DIAGNOSIS — I5022 Chronic systolic (congestive) heart failure: Secondary | ICD-10-CM

## 2012-12-05 NOTE — Progress Notes (Unsigned)
Placed a monitor on patient and went over instructions on how to use and when to return it

## 2013-01-02 ENCOUNTER — Other Ambulatory Visit: Payer: Self-pay | Admitting: Emergency Medicine

## 2013-01-02 DIAGNOSIS — I441 Atrioventricular block, second degree: Secondary | ICD-10-CM

## 2013-01-02 MED ORDER — METOPROLOL SUCCINATE ER 25 MG PO TB24: ORAL_TABLET | ORAL | Status: DC

## 2013-01-08 ENCOUNTER — Ambulatory Visit (INDEPENDENT_AMBULATORY_CARE_PROVIDER_SITE_OTHER): Payer: Medicare Other | Admitting: Internal Medicine

## 2013-01-08 ENCOUNTER — Encounter: Payer: Self-pay | Admitting: Internal Medicine

## 2013-01-08 VITALS — BP 145/70 | HR 50 | Ht 68.0 in | Wt 181.0 lb

## 2013-01-08 DIAGNOSIS — I4891 Unspecified atrial fibrillation: Secondary | ICD-10-CM

## 2013-01-08 DIAGNOSIS — I4949 Other premature depolarization: Secondary | ICD-10-CM

## 2013-01-08 DIAGNOSIS — I440 Atrioventricular block, first degree: Secondary | ICD-10-CM

## 2013-01-08 DIAGNOSIS — I441 Atrioventricular block, second degree: Secondary | ICD-10-CM

## 2013-01-08 DIAGNOSIS — I5022 Chronic systolic (congestive) heart failure: Secondary | ICD-10-CM

## 2013-01-08 DIAGNOSIS — I429 Cardiomyopathy, unspecified: Secondary | ICD-10-CM

## 2013-01-08 DIAGNOSIS — I493 Ventricular premature depolarization: Secondary | ICD-10-CM

## 2013-01-08 MED ORDER — AMIODARONE HCL 200 MG PO TABS: ORAL_TABLET | ORAL | Status: DC

## 2013-01-08 NOTE — Assessment & Plan Note (Signed)
Holter monitor reveals .02% PVC burden.  This is much better than previous.  I will stop toprol and decrease amiodarone to 100mg  daily. I will ask Dr Tresa Endo to repeat an echo at this point to look for improvement in EF with suppression of PVCs.

## 2013-01-08 NOTE — Patient Instructions (Addendum)
Your physician wants you to follow-up in: 6 months with Dr Jacquiline Doe will receive a reminder letter in the mail two months in advance. If you don't receive a letter, please call our office to schedule the follow-up appointment.   Your physician has recommended you make the following change in your medication:  1) STOP Toprol 2) Decrease Amiodarone to 100mg  daily  Your physician has requested that you have an echocardiogram. Echocardiography is a painless test that uses sound waves to create images of your heart. It provides your doctor with information about the size and shape of your heart and how well your heart's chambers and valves are working. This procedure takes approximately one hour. There are no restrictions for this procedure.----Dr. Landry Dyke office to do

## 2013-01-08 NOTE — Assessment & Plan Note (Signed)
Long first degree AV block with rare second degree AV block on monitor.  Asymptomatic Will stop metoprolol and decrease amiodarone. I suspect that there is some component of conduction system disease, though by EP study, HV was 52 Will continue to follow

## 2013-01-08 NOTE — Assessment & Plan Note (Signed)
Clinically improved Will ask Dr Tresa Endo to repeat echo at this time

## 2013-01-08 NOTE — Progress Notes (Signed)
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  Since his last visit, the patient reports doing very well.  He feels that his exercise tolerance is much improved.  His palpitations and SOB have resolved.  Today, he denies symptoms of chest pain, lower extremity edema, or syncope.   He denies any further symptoms of bradycardia.  The patient is otherwise without complaint today.   Past Medical History  Diagnosis Date  . Colon polyp, hyperplastic   . Spondylolisthesis   . Hypertension   . CHF (congestive heart failure)   . Unstable angina 11/22/2011  . Respiratory failure, acute 11/22/2011  . CAD (coronary artery disease) 80% stenosis diag of the LAD, 30% in OM2 branch of LCX in 2009 11/22/2011  . Accelerated hypertension 11/22/2011  . Unstable angina 05/26/2012  . LV dysfunction, hx of EF 20% but most recent Echo 03/14/12 EF 35 -40%, life vest discontinued 05/26/2012  . Nonischemic cardiomyopathy   . Ventricular ectopy Hx of PVCs 11/22/2011  . 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 Myocardial infarction 11/22/11  . Acute bronchitis 05/26/12  . Exertional dyspnea   . Legally blind     "both eyes"  . 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 CKD (chronic kidney disease) stage 3, GFR 30-59 ml/min 05/27/2012  . Acute on chronic systolic CHF (congestive heart failure) 06/05/2012   Past Surgical History  Procedure Laterality Date  . Cystoscopy    . Rotator cuff repair  2000's    left  . Back surgery    . Cardiac catheterization    . Posterior fusion lumbar spine  1979  . Cataract extraction, bilateral  1990's  . Ep study and ablation of vt  7/13    PVC focus mapped to the right coronary cusp of the aorta, limited ablation performed due to proximity of the focus to the right coronary artery    Current Outpatient Prescriptions   Medication Sig Dispense Refill  . amiodarone (PACERONE) 200 MG tablet Take 1 tablet (200 mg total) by mouth daily. Take one daily  30 tablet  11  . amLODipine (NORVASC) 10 MG tablet Take 1 tablet (10 mg total) by mouth daily.  30 tablet  11  . aspirin 325 MG tablet Take 325 mg by mouth daily.        . furosemide (LASIX) 20 MG tablet Take 1 tablet (20 mg total) by mouth daily.  30 tablet  11  . metoprolol succinate (TOPROL-XL) 25 MG 24 hr tablet Take one tablet by mouth daily  30 tablet  11  . nitroGLYCERIN (NITROSTAT) 0.4 MG SL tablet Place 1 tablet (0.4 mg total) under the tongue every 5 (five) minutes as needed for chest pain.  25 tablet  4  . pantoprazole (PROTONIX) 40 MG tablet Take 40 mg by mouth daily.        . pravastatin (PRAVACHOL) 40 MG tablet Take 40 mg by mouth daily.      Marland Kitchen tiZANidine (ZANAFLEX) 4 MG tablet Take 4 mg by mouth 3 (three) times daily as needed. For muscle spasms       No current facility-administered medications for this visit.    Physical Exam: Filed Vitals:   01/08/13 1049  BP: 145/70  Pulse: 50  Height: 5\' 8"  (1.727 m)  Weight: 181 lb (82.101 kg)  GEN- The patient is well appearing, alert and oriented x 3 today.   Head- normocephalic, atraumatic Eyes-  Sclera clear, conjunctiva pink Ears- hearing intact Oropharynx- clear Lungs- Clear to ausculation bilaterally, normal work of breathing Heart- Regular rate and rhythm, no murmurs, rubs or gallops, PMI not laterally displaced GI- soft, NT, ND, + BS Extremities- no clubbing, cyanosis, or edema  ekg today reveals sinus 50 bpm, PR 372, nonspecific ST/T changes Event monitor 12/05/12 reviewed  Assessment and Plan:

## 2013-01-10 ENCOUNTER — Other Ambulatory Visit (HOSPITAL_COMMUNITY): Payer: Self-pay | Admitting: Cardiovascular Disease

## 2013-01-10 DIAGNOSIS — I428 Other cardiomyopathies: Secondary | ICD-10-CM

## 2013-01-10 DIAGNOSIS — I739 Peripheral vascular disease, unspecified: Secondary | ICD-10-CM

## 2013-01-10 DIAGNOSIS — I119 Hypertensive heart disease without heart failure: Secondary | ICD-10-CM

## 2013-01-16 ENCOUNTER — Ambulatory Visit (HOSPITAL_COMMUNITY): Payer: Medicare Other

## 2013-01-30 ENCOUNTER — Ambulatory Visit (HOSPITAL_COMMUNITY)
Admission: RE | Admit: 2013-01-30 | Discharge: 2013-01-30 | Disposition: A | Discharge status: Home / Self Care | Payer: Medicare Other | Source: Ambulatory Visit | Attending: Cardiovascular Disease | Admitting: Cardiovascular Disease

## 2013-01-30 DIAGNOSIS — I428 Other cardiomyopathies: Secondary | ICD-10-CM | POA: Insufficient documentation

## 2013-01-30 DIAGNOSIS — I119 Hypertensive heart disease without heart failure: Secondary | ICD-10-CM

## 2013-01-30 DIAGNOSIS — I739 Peripheral vascular disease, unspecified: Secondary | ICD-10-CM | POA: Insufficient documentation

## 2013-01-30 DIAGNOSIS — I10 Essential (primary) hypertension: Secondary | ICD-10-CM | POA: Insufficient documentation

## 2013-01-30 NOTE — Progress Notes (Signed)
2D Echo Performed 01/30/2013    Tammie Crouch, RCS  

## 2013-03-19 ENCOUNTER — Encounter (HOSPITAL_COMMUNITY): Payer: Self-pay | Admitting: Nurse Practitioner

## 2013-03-19 ENCOUNTER — Emergency Department (HOSPITAL_COMMUNITY)
Admission: EM | Admit: 2013-03-19 | Discharge: 2013-03-20 | Disposition: A | Discharge status: Home / Self Care | Payer: Medicare Other | Attending: Emergency Medicine | Admitting: Emergency Medicine

## 2013-03-19 ENCOUNTER — Emergency Department (HOSPITAL_COMMUNITY): Payer: Medicare Other

## 2013-03-19 DIAGNOSIS — R059 Cough, unspecified: Secondary | ICD-10-CM | POA: Insufficient documentation

## 2013-03-19 DIAGNOSIS — R0602 Shortness of breath: Secondary | ICD-10-CM | POA: Insufficient documentation

## 2013-03-19 DIAGNOSIS — Z79899 Other long term (current) drug therapy: Secondary | ICD-10-CM | POA: Insufficient documentation

## 2013-03-19 DIAGNOSIS — Z860100 Personal history of colon polyps, unspecified: Secondary | ICD-10-CM | POA: Insufficient documentation

## 2013-03-19 DIAGNOSIS — I5023 Acute on chronic systolic (congestive) heart failure: Secondary | ICD-10-CM | POA: Insufficient documentation

## 2013-03-19 DIAGNOSIS — Z87891 Personal history of nicotine dependence: Secondary | ICD-10-CM | POA: Insufficient documentation

## 2013-03-19 DIAGNOSIS — Z7982 Long term (current) use of aspirin: Secondary | ICD-10-CM | POA: Insufficient documentation

## 2013-03-19 DIAGNOSIS — N183 Chronic kidney disease, stage 3 unspecified: Secondary | ICD-10-CM | POA: Insufficient documentation

## 2013-03-19 DIAGNOSIS — I2510 Atherosclerotic heart disease of native coronary artery without angina pectoris: Secondary | ICD-10-CM | POA: Insufficient documentation

## 2013-03-19 DIAGNOSIS — R1013 Epigastric pain: Secondary | ICD-10-CM

## 2013-03-19 DIAGNOSIS — I252 Old myocardial infarction: Secondary | ICD-10-CM | POA: Insufficient documentation

## 2013-03-19 DIAGNOSIS — I129 Hypertensive chronic kidney disease with stage 1 through stage 4 chronic kidney disease, or unspecified chronic kidney disease: Secondary | ICD-10-CM | POA: Insufficient documentation

## 2013-03-19 DIAGNOSIS — R05 Cough: Secondary | ICD-10-CM | POA: Insufficient documentation

## 2013-03-19 DIAGNOSIS — Z8709 Personal history of other diseases of the respiratory system: Secondary | ICD-10-CM | POA: Insufficient documentation

## 2013-03-19 DIAGNOSIS — Z8679 Personal history of other diseases of the circulatory system: Secondary | ICD-10-CM | POA: Insufficient documentation

## 2013-03-19 DIAGNOSIS — Z8739 Personal history of other diseases of the musculoskeletal system and connective tissue: Secondary | ICD-10-CM | POA: Insufficient documentation

## 2013-03-19 DIAGNOSIS — R0789 Other chest pain: Secondary | ICD-10-CM

## 2013-03-19 LAB — POCT I-STAT TROPONIN I
Troponin i, poc: 0.02 ng/mL (ref 0.00–0.08)
Troponin i, poc: 0.02 ng/mL (ref 0.00–0.08)

## 2013-03-19 LAB — CBC
HCT: 43 % (ref 39.0–52.0)
Hemoglobin: 15.3 g/dL (ref 13.0–17.0)
MCH: 30.1 pg (ref 26.0–34.0)
MCHC: 35.6 g/dL (ref 30.0–36.0)
MCV: 84.6 fL (ref 78.0–100.0)
Platelets: 150 K/uL (ref 150–400)
RBC: 5.08 MIL/uL (ref 4.22–5.81)
RDW: 12.6 % (ref 11.5–15.5)
WBC: 4.7 K/uL (ref 4.0–10.5)

## 2013-03-19 LAB — BASIC METABOLIC PANEL WITH GFR
Calcium: 9.8 mg/dL (ref 8.4–10.5)
Chloride: 103 meq/L (ref 96–112)
Creatinine, Ser: 2.13 mg/dL — ABNORMAL HIGH (ref 0.50–1.35)
GFR calc Af Amer: 33 mL/min — ABNORMAL LOW (ref >90)
GFR calc non Af Amer: 29 mL/min — ABNORMAL LOW (ref >90)

## 2013-03-19 LAB — BASIC METABOLIC PANEL
BUN: 23 mg/dL (ref 6–23)
CO2: 26 mEq/L (ref 19–32)
Glucose, Bld: 113 mg/dL — ABNORMAL HIGH (ref 70–99)
Potassium: 4.2 mEq/L (ref 3.5–5.1)
Sodium: 140 mEq/L (ref 135–145)

## 2013-03-19 LAB — PRO B NATRIURETIC PEPTIDE: Pro B Natriuretic peptide (BNP): 155.4 pg/mL — ABNORMAL HIGH (ref 0–125)

## 2013-03-19 LAB — LIPASE, BLOOD: Lipase: 19 U/L (ref 11–59)

## 2013-03-19 MED ORDER — NITROGLYCERIN 0.4 MG SL SUBL: 0.4000 mg | SUBLINGUAL_TABLET | SUBLINGUAL | Status: DC | PRN

## 2013-03-19 MED ORDER — MORPHINE SULFATE 4 MG/ML IJ SOLN
4.0000 mg | Freq: Once | INTRAMUSCULAR | Status: AC
  Administered 2013-03-19: 4 mg via INTRAVENOUS
  Filled 2013-03-19: qty 1

## 2013-03-19 MED ORDER — TRAMADOL HCL 50 MG PO TABS: 50.0000 mg | ORAL_TABLET | Freq: Four times a day (QID) | ORAL | Status: DC | PRN

## 2013-03-19 MED ORDER — ASPIRIN 81 MG PO CHEW
162.0000 mg | CHEWABLE_TABLET | Freq: Once | ORAL | Status: AC
  Administered 2013-03-19: 162 mg via ORAL
  Filled 2013-03-19: qty 4

## 2013-03-19 MED ORDER — ASPIRIN 325 MG PO TABS
325.0000 mg | ORAL_TABLET | ORAL | Status: AC
  Administered 2013-03-19: 325 mg via ORAL
  Filled 2013-03-19: qty 1

## 2013-03-19 MED ORDER — NITROGLYCERIN 0.4 MG SL SUBL
0.4000 mg | SUBLINGUAL_TABLET | SUBLINGUAL | Status: DC | PRN
  Filled 2013-03-19: qty 25

## 2013-03-19 NOTE — ED Provider Notes (Signed)
History     CSN: [id number]  Arrival date & time 03/19/13  1739   First MD Initiated Contact with Patient 03/19/13 2058      Chief Complaint  Patient presents with  . Chest Pain    (Consider location/radiation/quality/duration/timing/severity/associated sxs/prior treatment) HPI Comments: Patient reports that he was asleep this morning when he developed some left-sided chest pain that unfortunately was similar to pain he experienced when he had a small heart attack. Patient reports that he was admitted and had a heart cath around late December of 2012. Patient reports trying a nitroglycerin this morning without significant improvement in his pain. He reports the pain seems that lingered all day today and thus presented to the emergency department late this afternoon. Patient denies diaphoresis, nausea or vomiting. He endorses some mild shortness of breath associated with the symptoms. He reports he has a history of hypertension, hyperlipidemia. Otherwise he did not take any further medications for his discomfort. He reports at some point he did get to a 10 out of 10 pain, currently is out of 4 or 5/10. Patient reports he sees primarily Dr. Tresa Endo as his cardiologist. He is also seen Dr. Johney Frame with the [facility] cardiology more for just as atrial fibrillation. He reports that he is not on chronic Coumadin therapy. He did not take his aspirin today.  He reports slight dry cough, but not any worse than usual.  Patient is a 72 y.o. male presenting with chest pain. The history is provided by the patient, a relative and medical records.  Chest Pain Associated symptoms: cough and shortness of breath   Associated symptoms: no diaphoresis, no fever, no nausea and not vomiting     Past Medical History  Diagnosis Date  . Colon polyp, hyperplastic   . Spondylolisthesis   . Hypertension   . CHF (congestive heart failure)   . Unstable angina 11/22/2011  . Respiratory failure, acute 11/22/2011  . CAD  (coronary artery disease) 80% stenosis diag of the LAD, 30% in OM2 branch of LCX in 2009 11/22/2011  . Accelerated hypertension 11/22/2011  . Unstable angina 05/26/2012  . LV dysfunction, hx of EF 20% but most recent Echo 03/14/12 EF 35 -40%, life vest discontinued 05/26/2012  . Nonischemic cardiomyopathy   . Ventricular ectopy Hx of PVCs 11/22/2011  . 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 Myocardial infarction 11/22/11  . Acute bronchitis 05/26/12  . Exertional dyspnea   . Legally blind     "both eyes"  . 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 CKD (chronic kidney disease) stage 3, GFR 30-59 ml/min 05/27/2012  . Acute on chronic systolic CHF (congestive heart failure) 06/05/2012    Past Surgical History  Procedure Laterality Date  . Cystoscopy    . Rotator cuff repair  2000's    left  . Back surgery    . Cardiac catheterization  11/2011  . Posterior fusion lumbar spine  1979  . Cataract extraction, bilateral  1990's  . Ep study and ablation of vt  7/13    PVC focus mapped to the right coronary cusp of the aorta, limited ablation performed due to proximity of the focus to the right coronary artery    History reviewed. No pertinent family history.  History  Substance Use Topics  . Smoking status: Former Smoker -- 50 years    Types: Cigarettes    Quit date: 11/23/1999  . Smokeless tobacco: Never Used  .  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  Respiratory: Positive for cough and shortness of breath.   Cardiovascular: Positive for chest pain.  Gastrointestinal: Negative for nausea and vomiting.  All other systems reviewed and are negative.    Allergies  Review of patient's allergies indicates no known allergies.  Home Medications   Current Outpatient Rx  Name  Route  Sig  Dispense   Refill  . amiodarone (PACERONE) 200 MG tablet      Take 1/2 tablet daily   30 tablet   11   . amLODipine (NORVASC) 10 MG tablet   Oral   Take 1 tablet (10 mg total) by mouth daily.   30 tablet   11   . aspirin 325 MG tablet   Oral   Take 325 mg by mouth daily.           . furosemide (LASIX) 20 MG tablet   Oral   Take 1 tablet (20 mg total) by mouth daily.   30 tablet   11   . nitroGLYCERIN (NITROSTAT) 0.4 MG SL tablet   Sublingual   Place 1 tablet (0.4 mg total) under the tongue every 5 (five) minutes as needed for chest pain.   25 tablet   4   . pantoprazole (PROTONIX) 40 MG tablet   Oral   Take 40 mg by mouth daily.           . polyvinyl alcohol (LIQUIFILM TEARS) 1.4 % ophthalmic solution   Both Eyes   Place 1 drop into both eyes 2 (two) times daily.         . pravastatin (PRAVACHOL) 40 MG tablet   Oral   Take 40 mg by mouth daily.         Marland Kitchen tiZANidine (ZANAFLEX) 4 MG tablet   Oral   Take 4 mg by mouth 3 (three) times daily as needed. For muscle spasms         . travoprost, benzalkonium, (TRAVATAN) 0.004 % ophthalmic solution   Both Eyes   Place 1 drop into both eyes at bedtime.           BP 145/77  Pulse 58  Temp(Src) 98 F (36.7 C) (Oral)  Resp 14  Ht 5\' 8"  (1.727 m)  Wt 174 lb (78.926 kg)  BMI 26.46 kg/m2  SpO2 98%  Physical Exam  Nursing note and vitals reviewed. Constitutional: He appears well-developed and well-nourished.  HENT:  Head: Normocephalic and atraumatic.  Eyes: EOM are normal.  Neck: Normal range of motion. Neck supple.  Cardiovascular: Normal rate, regular rhythm and intact distal pulses.   Pulmonary/Chest: Effort normal. No respiratory distress. He has no wheezes. He has no rales.  Abdominal: Soft. Bowel sounds are normal. He exhibits no distension. There is no tenderness. There is no rebound and no guarding.  Neurological: He is alert. Coordination normal.  Skin: Skin is warm. No rash noted.    ED Course   Procedures (including critical care time)  Labs Reviewed  BASIC METABOLIC PANEL - Abnormal; Notable for the following:    Glucose, Bld 113 (*)    Creatinine, Ser 2.13 (*)    GFR calc non Af Amer 29 (*)    GFR calc Af Amer 33 (*)    All other components within normal limits  PRO B NATRIURETIC  PEPTIDE - Abnormal; Notable for the following:    Pro B Natriuretic peptide (BNP) 155.4 (*)    All other components within normal limits  HEPATIC FUNCTION PANEL - Abnormal; Notable for the following:    Total Protein 8.5 (*)    All other components within normal limits  CBC  LIPASE, BLOOD  POCT I-STAT TROPONIN I  POCT I-STAT TROPONIN I   Dg Chest 2 View  03/19/2013  *RADIOLOGY REPORT*  Clinical Data: Chest pain with dizziness.  History of myocardial infarction.  CHEST - 2 VIEW  Comparison: 05/26/2012 and 11/22/2011.  Findings: The heart size and mediastinal contours are stable.  The aorta is tortuous.  Again demonstrated is diffuse interstitial prominence which appears unchanged.  No airspace disease, pleural effusion or pneumothorax is identified.  The patient is status post lumbar fusion.  IMPRESSION: Stable interstitial prominence, likely chronic.  No acute cardiopulmonary process identified.   Original Report Authenticated By: Carey Bullocks, M.D.    US Abdomen Complete  03/19/2013  *RADIOLOGY REPORT*  Clinical Data:  Abdominal pain.  COMPLETE ABDOMINAL ULTRASOUND  Comparison:  None.  Findings:  Gallbladder:  Cholecystectomy.  Common bile duct:  8 mm, within normal limits status post cholecystectomy.  Liver:  Hyperechoic area is present in the inferior right hepatic lobe measuring 15 mm x 9 ml x 10 mm.  Statistically this likely represents benign hemangioma.  The in the absence of a history malignancy, consider 34-month follow-up ultrasound to assess for stability.  IVC:  Appears normal.  Pancreas:  Suboptimal visualization due to overlying bowel gas.  No gross abnormality.  Spleen:  8.7 cm.  Normal  echotexture.  Right Kidney:  Calcification in the right upper pole measuring 11 mm which may represent collecting system calculus.  Calcification was seen on prior plain films 07/27/2011, consistent with urolithiasis.  No obstruction.  9.1 cm.  Parenchymal texture otherwise within normal limits.  Left Kidney:  9.6 cm. Normal echotexture.  Normal central sinus echo complex.  No calculi or hydronephrosis.  Abdominal aorta:  Portions of the abdominal aorta are obscured by overlying bowel gas.  No aneurysm identified.  IMPRESSION: 1.  Cholecystectomy. 2.  Right upper pole nonobstructing renal collecting system calculus. 3.  15 mm hyperechoic lesion in the right hepatic lobe probably represents a hemangioma.  Recommend 66-month follow-up right upper quadrant ultrasound to assess for stability in the absence of given history of malignancy. 4.  No acute abnormality.   Original Report Authenticated By: Andreas Newport, M.D.      1. Atypical chest pain   2. Epigastric pain     Oxygen saturation 98% on 2 L nasal cannula I interpret to be adequate. EKG at time 17:45, shows sinus rhythm with first degree AV block at a rate of 58. Nonspecific ST abnormalities noted inferiorly and laterally. Nonspecific intraventricular delay is noted. Lead reversal versus right axis deviation is noted. Interpretation is abnormal EKG.  EKG is not significantly changed compared to that from 11/27/2012.  10:29 PM Pt reports pain is improved, not has some upper epigastric tenderness.  Pt reports has had gall stones in the past, doesn't think they took his GB, thinks they just took the stones.  Will add LFT's, lipase and U/S.  His current CP is reproducible more with palpation and when he tries to sit up using his abd muscles making this highly atypical.   MDM   Patient with apparent history of non-ST elevation MI in the past, last had a cardiac cath  in January of 2013. I do not see result of the cath, however see a discharge summary  from that admission detailing the fact that there is no significant progression in disease at that time. Here the patient continues to have some chest pain with associated shortness of breath and is concerning do to his statement that the pain is similar to what he has had in the past. However there are no new ischemic changes on his EKG and initial troponin is normal. Plan is to give aspirin, nitroglycerin, morphine and obtain serial troponins and continue to monitor her for symptom improvement. Most likely the patient will require admission given his history of coronary artery disease, , age and other cardiac risk factors.        Gavin Pound. Damean Poffenberger, MD 03/19/13 2356

## 2013-03-19 NOTE — ED Notes (Signed)
Pt reports he was doing chores around the house this am and started to have L sided CP with sob and dizziness. Pt reports symptoms have remained since onset. A&Ox4, resp e/u. States "feels like when i had my heart attack."

## 2013-03-19 NOTE — ED Notes (Signed)
Last dose NTG this am 0.4mg  x1

## 2013-03-19 NOTE — Discharge Instructions (Signed)
 On ultrasound, they did see a hemangioma on your liver which will need a repeat ultrasound that can be arranged by your primary physician in order to ensure that it is not changing or enlarging.

## 2013-03-20 LAB — HEPATIC FUNCTION PANEL
ALT: 22 U/L (ref 0–53)
AST: 33 U/L (ref 0–37)
Albumin: 4.1 g/dL (ref 3.5–5.2)
Alkaline Phosphatase: 64 U/L (ref 39–117)
Bilirubin, Direct: 0.1 mg/dL (ref 0.0–0.3)
Indirect Bilirubin: 0.4 mg/dL (ref 0.3–0.9)
Total Bilirubin: 0.5 mg/dL (ref 0.3–1.2)
Total Protein: 8.5 g/dL — ABNORMAL HIGH (ref 6.0–8.3)

## 2013-03-26 ENCOUNTER — Other Ambulatory Visit (HOSPITAL_COMMUNITY): Payer: Self-pay | Admitting: Cardiovascular Disease

## 2013-03-26 DIAGNOSIS — I428 Other cardiomyopathies: Secondary | ICD-10-CM

## 2013-04-03 ENCOUNTER — Other Ambulatory Visit: Payer: Self-pay | Admitting: Neurological Surgery

## 2013-04-03 DIAGNOSIS — M545 Low back pain: Secondary | ICD-10-CM

## 2013-04-10 ENCOUNTER — Ambulatory Visit
Admission: RE | Admit: 2013-04-10 | Discharge: 2013-04-10 | Disposition: A | Discharge status: Home / Self Care | Payer: Medicare Other | Source: Ambulatory Visit | Attending: Neurological Surgery | Admitting: Neurological Surgery

## 2013-04-10 VITALS — BP 121/67 | HR 54

## 2013-04-10 DIAGNOSIS — M545 Low back pain: Secondary | ICD-10-CM

## 2013-04-10 MED ORDER — DIAZEPAM 5 MG PO TABS
5.0000 mg | ORAL_TABLET | Freq: Once | ORAL | Status: AC
  Administered 2013-04-10: 5 mg via ORAL

## 2013-04-10 MED ORDER — IOHEXOL 180 MG/ML  SOLN
15.0000 mL | Freq: Once | INTRAMUSCULAR | Status: AC | PRN
  Administered 2013-04-10: 15 mL via INTRATHECAL

## 2013-04-25 ENCOUNTER — Telehealth: Payer: Self-pay | Admitting: *Deleted

## 2013-04-25 NOTE — Telephone Encounter (Signed)
Faxed surgical clearance. [phone].

## 2013-04-26 ENCOUNTER — Encounter (HOSPITAL_COMMUNITY): Payer: Self-pay | Admitting: Pharmacist

## 2013-04-27 NOTE — Pre-Procedure Instructions (Signed)
STORY VANVRANKEN  04/27/2013   Your procedure is scheduled on:  Wednesday, June 11th.  Report to [facility] at 7:15 AM.  Enter hospital through [facility], go to [facility], 3rd Floor- Short Stay.  Call this number if you have problems the morning of surgery: [phone]   Remember:   Do not eat food or drink liquids after midnight.    Take these medicines the morning of surgery with A SIP OF WATER: amiodarone (PACERONE),amLODipine (NORVASC),isosorbide mononitrate (IMDUR),pantoprazole (PROTONIX).   Take if needed:nitroGLYCERIN (NITROSTAT).      Do not wear jewelry, make-up or nail polish.  Do not wear lotions, powders, or perfumes. You may wear deodorant.             Men may shave face and neck.  Do not bring valuables to the hospital.  [facility] is not responsible   for any belongings or valuables.  Contacts, dentures or bridgework may not be worn into surgery.  Leave suitcase in the car. After surgery it may be brought to your room.  For patients admitted to the hospital, checkout time is 11:00 AM the day of  discharge.    Special Instructions: Shower using CHG 2 nights before surgery and the night before surgery.  If you shower the day of surgery use CHG.  Use special wash - you have one bottle of CHG for all showers.  You should use approximately 1/3 of the bottle for each shower.   Please read over the following fact sheets that you were given: Pain Booklet, Coughing and Deep Breathing, Blood Transfusion Information and Surgical Site Infection Prevention

## 2013-04-30 ENCOUNTER — Encounter (HOSPITAL_COMMUNITY): Payer: Self-pay

## 2013-04-30 ENCOUNTER — Encounter (HOSPITAL_COMMUNITY)
Admission: RE | Admit: 2013-04-30 | Discharge: 2013-04-30 | Disposition: A | Discharge status: Home / Self Care | Payer: Medicare Other | Source: Ambulatory Visit | Attending: Neurological Surgery | Admitting: Neurological Surgery

## 2013-04-30 ENCOUNTER — Other Ambulatory Visit: Payer: Self-pay | Admitting: Neurological Surgery

## 2013-04-30 LAB — SURGICAL PCR SCREEN: Staphylococcus aureus: NEGATIVE

## 2013-04-30 LAB — CBC
HCT: 44.7 % (ref 39.0–52.0)
Hemoglobin: 15.2 g/dL (ref 13.0–17.0)
MCH: 29.9 pg (ref 26.0–34.0)
MCV: 87.8 fL (ref 78.0–100.0)
Platelets: 146 10*3/uL — ABNORMAL LOW (ref 150–400)
RBC: 5.09 MIL/uL (ref 4.22–5.81)
WBC: 4.7 10*3/uL (ref 4.0–10.5)

## 2013-04-30 LAB — BASIC METABOLIC PANEL
BUN: 18 mg/dL (ref 6–23)
CO2: 25 mEq/L (ref 19–32)
Calcium: 9.4 mg/dL (ref 8.4–10.5)
Chloride: 106 mEq/L (ref 96–112)
Creatinine, Ser: 1.83 mg/dL — ABNORMAL HIGH (ref 0.50–1.35)
Glucose, Bld: 112 mg/dL — ABNORMAL HIGH (ref 70–99)

## 2013-04-30 LAB — DIFFERENTIAL
Eosinophils Absolute: 0.1 10*3/uL (ref 0.0–0.7)
Eosinophils Relative: 3 % (ref 0–5)
Lymphocytes Relative: 38 % (ref 12–46)
Lymphs Abs: 1.8 10*3/uL (ref 0.7–4.0)
Monocytes Absolute: 0.5 10*3/uL (ref 0.1–1.0)
Monocytes Relative: 10 % (ref 3–12)

## 2013-04-30 LAB — TYPE AND SCREEN: ABO/RH(D): B POS

## 2013-04-30 NOTE — Progress Notes (Signed)
Called Dr Yetta Barre office for orders, left message for [place] with answering service.

## 2013-05-01 ENCOUNTER — Other Ambulatory Visit: Payer: Self-pay | Admitting: Neurological Surgery

## 2013-05-01 NOTE — Anesthesia Preprocedure Evaluation (Addendum)
Anesthesia Evaluation  Patient identified by MRN, date of birth, ID band Patient awake    Reviewed: Allergy & Precautions, H&P , NPO status , Patient's Chart, lab work & pertinent test results  History of Anesthesia Complications Negative for: history of anesthetic complications  Airway Mallampati: I TM Distance: >3 FB Neck ROM: Full    Dental  (+) Edentulous Upper, Edentulous Lower and Dental Advisory Given   Pulmonary former smoker,  breath sounds clear to auscultation  Pulmonary exam normal       Cardiovascular hypertension, Pt. on medications + CAD (non-obstructive ASCADz by cath 12/12) + dysrhythmias (h/o Wenckebach: resolved with medication adjustment, s/p PVC ablation) Rhythm:Regular Rate:Normal  3/14 ECHO: normal LVF with EF 55-60%, valves OK   Neuro/Psych Chronic back pain    GI/Hepatic Neg liver ROS, GERD-  Medicated and Controlled,  Endo/Other  negative endocrine ROS  Renal/GU Renal InsufficiencyRenal disease (creat 1.83)     Musculoskeletal   Abdominal   Peds  Hematology   Anesthesia Other Findings   Reproductive/Obstetrics                       Anesthesia Physical Anesthesia Plan  ASA: III  Anesthesia Plan: General   Post-op Pain Management:    Induction: Intravenous  Airway Management Planned: Oral ETT  Additional Equipment:   Intra-op Plan:   Post-operative Plan: Extubation in OR  Informed Consent: I have reviewed the patients History and Physical, chart, labs and discussed the procedure including the risks, benefits and alternatives for the proposed anesthesia with the patient or authorized representative who has indicated his/her understanding and acceptance.     Plan Discussed with: Surgeon and CRNA  Anesthesia Plan Comments: (See anesthesia note.  Per Dr. Yetta Barre' office.  They have spoken with patient's urologist Dr. Brunilda Payor who requests that he place a foley  catheter once patient is under anesthesia.  Dr. Brunilda Payor would like to be called just before patient is ready for anesthesia to allow him time to get to [facility].  Please have staff contact [facility] at [phone].  Shonna Chock, PA-C  Plan routine monitors, GETA)       Anesthesia Quick Evaluation

## 2013-05-01 NOTE — Progress Notes (Signed)
Anesthesia chart review: Patient is a 72 year old male scheduled for L2-3 PLIF on 05/04/2013 by Dr. Yetta Barre.   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 Hillis Range) AV block hypertension, chronic kidney disease stage III, acute respiratory failure in the setting of acute CHF 10/2011, legally blind, L3-4 PLIF '12. PCP is listed as Dr. Bruna Potter.  Primary cardiologist is Dr. Nicki Guadalajara [facility]) who cleared him for this procedure.  EKG on 03/22/13 showed SR, first degree AVB, non-specific IVCB, prolonged QT, nonspecific ST abnormality.  Echo on 01/30/13 showed: - Left ventricle: The cavity size was normal. Systolic function was normal. The estimated ejection fraction was in the range of 55% to 60%. Wall motion was normal; there were no regional wall motion abnormalities. Left ventricular diastolic function parameters were normal. - Aortic valve: Mild focal calcification involving the noncoronary cusp. - Mitral valve: Valve area by pressure half-time: 1.85cm^2. Impressions: Compared to April 2013, there is marked improvement in left ventricular function.  Follow-up (post-ablation) holter monitor read on 01/05/13 showed SB with first degree AVB, PVC burden is significantly decreased, second degree AVB observed.  Cardiac cath on 11/22/11 showed: Left main: angiographically normal, large LAD, large Circumflex, very small Ramus Intermedius  LAD: large caliber vessel which, the apex gives rise to very small proximal diagonal branch and a very large bifurcating first septal perforator. Dissected out there is a second diagonal branch the major branch. At this bifurcation there is a roughly 40% lesion. The remainder vessels without any significant disease. The second branch has a possible 40% followed by a focal 60 cm lesion which is no  significant change from previous cardiac catheterization.  Left Circumflex: Large caliber nondominant vessel, gives rise to small OM 1 and then 3 moderate caliber obtuse marginal branches into the AV groove (the third of which is likely in the left posterior lateral branch). No significant disease noted, just mild luminal irregularities  Ramus Intermedius: Small caliber runs a high diagonal distribution, no angiographically significant disease Right Coronary Artery: Large dominant vessel with extensive tort tortuosity in the proximal segment; bifurcates into Right Posterior Atrioventricular Groove Branch and a double-barreled Posterior Descending Artery. There are 2 small posterior lateral branches coming off of the proximal PDA before the bifurcation. The posterior intraventricular groove vessel gives rise to 2 large posterior lateral vessels. No significant disease in the RCA system.   CXR on 03/19/13 showed stable interstitial prominence, likely chronic. No acute cardiopulmonary process identified.  Preoperative labs noted.  Cr appears stable at 1.83 (previously 2.13 on 03/19/13). H/H 15.2/44.7, PLT 146K. T&S already done.  I received a phone call from [place] at Dr. Yetta Barre' office this morning stating that their office had spoken with patient's urologist Dr. Brunilda Payor who requests that he place a foley catheter once patient is under anesthesia. Dr. Brunilda Payor would like to be called just before patient is ready for anesthesia to allow him time to get to [facility]. His office number ([facility]) is [phone].  I have noted this on patient's chart and on his Pre-Evaluation form.  Velna Ochs [facility] Short Stay Center/Anesthesiology Phone [phone] 05/01/2013 3:05 PM

## 2013-05-03 MED ORDER — DEXAMETHASONE SODIUM PHOSPHATE 10 MG/ML IJ SOLN: 10.0000 mg | INTRAMUSCULAR | Status: DC

## 2013-05-03 MED ORDER — CEFAZOLIN SODIUM-DEXTROSE 2-3 GM-% IV SOLR
2.0000 g | INTRAVENOUS | Status: AC
  Administered 2013-05-04 (×2): 2 g via INTRAVENOUS

## 2013-05-04 ENCOUNTER — Encounter (HOSPITAL_COMMUNITY): Payer: Self-pay | Admitting: Vascular Surgery

## 2013-05-04 ENCOUNTER — Encounter (HOSPITAL_COMMUNITY): Payer: Self-pay | Admitting: Neurological Surgery

## 2013-05-04 ENCOUNTER — Encounter (HOSPITAL_COMMUNITY)
Admission: RE | Disposition: A | Discharge status: Home / Self Care | Payer: Self-pay | Source: Ambulatory Visit | Attending: Neurological Surgery

## 2013-05-04 ENCOUNTER — Inpatient Hospital Stay (HOSPITAL_COMMUNITY): Payer: Medicare Other | Admitting: Anesthesiology

## 2013-05-04 ENCOUNTER — Inpatient Hospital Stay (HOSPITAL_COMMUNITY)
Admission: RE | Admit: 2013-05-04 | Discharge: 2013-05-07 | DRG: 460 | Disposition: A | Discharge status: Home / Self Care | Payer: Medicare Other | Source: Ambulatory Visit | Attending: Neurological Surgery | Admitting: Neurological Surgery

## 2013-05-04 ENCOUNTER — Inpatient Hospital Stay (HOSPITAL_COMMUNITY): Payer: Medicare Other

## 2013-05-04 DIAGNOSIS — Z87891 Personal history of nicotine dependence: Secondary | ICD-10-CM

## 2013-05-04 DIAGNOSIS — Z860100 Personal history of colon polyps, unspecified: Secondary | ICD-10-CM

## 2013-05-04 DIAGNOSIS — H548 Legal blindness, as defined in USA: Secondary | ICD-10-CM | POA: Diagnosis present

## 2013-05-04 DIAGNOSIS — Z981 Arthrodesis status: Secondary | ICD-10-CM

## 2013-05-04 DIAGNOSIS — I441 Atrioventricular block, second degree: Secondary | ICD-10-CM | POA: Diagnosis present

## 2013-05-04 DIAGNOSIS — N183 Chronic kidney disease, stage 3 unspecified: Secondary | ICD-10-CM | POA: Diagnosis present

## 2013-05-04 DIAGNOSIS — I252 Old myocardial infarction: Secondary | ICD-10-CM

## 2013-05-04 DIAGNOSIS — I5022 Chronic systolic (congestive) heart failure: Secondary | ICD-10-CM | POA: Diagnosis present

## 2013-05-04 DIAGNOSIS — N35919 Unspecified urethral stricture, male, unspecified site: Secondary | ICD-10-CM | POA: Diagnosis present

## 2013-05-04 DIAGNOSIS — Z472 Encounter for removal of internal fixation device: Secondary | ICD-10-CM

## 2013-05-04 DIAGNOSIS — K219 Gastro-esophageal reflux disease without esophagitis: Secondary | ICD-10-CM | POA: Diagnosis present

## 2013-05-04 DIAGNOSIS — Z79899 Other long term (current) drug therapy: Secondary | ICD-10-CM

## 2013-05-04 DIAGNOSIS — I509 Heart failure, unspecified: Secondary | ICD-10-CM | POA: Diagnosis present

## 2013-05-04 DIAGNOSIS — M47817 Spondylosis without myelopathy or radiculopathy, lumbosacral region: Principal | ICD-10-CM | POA: Diagnosis present

## 2013-05-04 DIAGNOSIS — Z01812 Encounter for preprocedural laboratory examination: Secondary | ICD-10-CM

## 2013-05-04 DIAGNOSIS — I129 Hypertensive chronic kidney disease with stage 1 through stage 4 chronic kidney disease, or unspecified chronic kidney disease: Secondary | ICD-10-CM | POA: Diagnosis present

## 2013-05-04 DIAGNOSIS — Z7982 Long term (current) use of aspirin: Secondary | ICD-10-CM

## 2013-05-04 DIAGNOSIS — I428 Other cardiomyopathies: Secondary | ICD-10-CM | POA: Diagnosis present

## 2013-05-04 DIAGNOSIS — I2510 Atherosclerotic heart disease of native coronary artery without angina pectoris: Secondary | ICD-10-CM | POA: Diagnosis present

## 2013-05-04 HISTORY — PX: CYSTOSCOPY WITH URETHRAL DILATATION: SHX5125

## 2013-05-04 LAB — PROTIME-INR: Prothrombin Time: 14.9 seconds (ref 11.6–15.2)

## 2013-05-04 SURGERY — POSTERIOR LUMBAR FUSION 1 LEVEL
Anesthesia: General | Site: Penis

## 2013-05-04 MED ORDER — ASPIRIN 325 MG PO TABS
325.0000 mg | ORAL_TABLET | Freq: Every morning | ORAL | Status: DC
  Administered 2013-05-05 – 2013-05-07 (×3): 325 mg via ORAL
  Filled 2013-05-04 (×3): qty 1

## 2013-05-04 MED ORDER — PROPOFOL 10 MG/ML IV BOLUS
INTRAVENOUS | Status: DC | PRN
  Administered 2013-05-04: 80 mg via INTRAVENOUS

## 2013-05-04 MED ORDER — THROMBIN 20000 UNITS EX SOLR
CUTANEOUS | Status: DC | PRN
  Administered 2013-05-04: 11:00:00 via TOPICAL

## 2013-05-04 MED ORDER — POTASSIUM CHLORIDE IN NACL 20-0.9 MEQ/L-% IV SOLN
INTRAVENOUS | Status: DC
  Administered 2013-05-04 – 2013-05-05 (×2): via INTRAVENOUS
  Filled 2013-05-04 (×7): qty 1000

## 2013-05-04 MED ORDER — HYDROMORPHONE HCL PF 1 MG/ML IJ SOLN
0.2500 mg | INTRAMUSCULAR | Status: DC | PRN
  Administered 2013-05-04 (×2): 0.5 mg via INTRAVENOUS

## 2013-05-04 MED ORDER — OXYCODONE HCL 5 MG/5ML PO SOLN: 5.0000 mg | Freq: Once | ORAL | Status: AC | PRN

## 2013-05-04 MED ORDER — ONDANSETRON HCL 4 MG/2ML IJ SOLN: 4.0000 mg | INTRAMUSCULAR | Status: DC | PRN

## 2013-05-04 MED ORDER — SPIRONOLACTONE 25 MG PO TABS
25.0000 mg | ORAL_TABLET | Freq: Every morning | ORAL | Status: DC
  Administered 2013-05-04 – 2013-05-07 (×4): 25 mg via ORAL
  Filled 2013-05-04 (×4): qty 1

## 2013-05-04 MED ORDER — METHOCARBAMOL 500 MG PO TABS
500.0000 mg | ORAL_TABLET | Freq: Four times a day (QID) | ORAL | Status: DC | PRN
  Administered 2013-05-04 – 2013-05-06 (×2): 500 mg via ORAL
  Filled 2013-05-04 (×2): qty 1

## 2013-05-04 MED ORDER — THROMBIN 5000 UNITS EX SOLR
OROMUCOSAL | Status: DC | PRN
  Administered 2013-05-04 (×2): via TOPICAL

## 2013-05-04 MED ORDER — FENTANYL CITRATE 0.05 MG/ML IJ SOLN
INTRAMUSCULAR | Status: DC | PRN
  Administered 2013-05-04 (×2): 50 ug via INTRAVENOUS
  Administered 2013-05-04: 250 ug via INTRAVENOUS

## 2013-05-04 MED ORDER — ACETAMINOPHEN 650 MG RE SUPP: 650.0000 mg | RECTAL | Status: DC | PRN

## 2013-05-04 MED ORDER — DEXAMETHASONE SODIUM PHOSPHATE 4 MG/ML IJ SOLN
4.0000 mg | Freq: Four times a day (QID) | INTRAMUSCULAR | Status: DC
  Filled 2013-05-04 (×12): qty 1

## 2013-05-04 MED ORDER — BUPIVACAINE HCL (PF) 0.25 % IJ SOLN
INTRAMUSCULAR | Status: DC | PRN
  Administered 2013-05-04: 10 mL

## 2013-05-04 MED ORDER — ROCURONIUM BROMIDE 100 MG/10ML IV SOLN
INTRAVENOUS | Status: DC | PRN
  Administered 2013-05-04: 50 mg via INTRAVENOUS

## 2013-05-04 MED ORDER — SODIUM CHLORIDE 0.9 % IJ SOLN
3.0000 mL | Freq: Two times a day (BID) | INTRAMUSCULAR | Status: DC
  Administered 2013-05-05 – 2013-05-06 (×4): 3 mL via INTRAVENOUS

## 2013-05-04 MED ORDER — SODIUM CHLORIDE 0.9 % IV SOLN: 250.0000 mL | INTRAVENOUS | Status: DC

## 2013-05-04 MED ORDER — MIDAZOLAM HCL 5 MG/5ML IJ SOLN
INTRAMUSCULAR | Status: DC | PRN
  Administered 2013-05-04: 2 mg via INTRAVENOUS

## 2013-05-04 MED ORDER — ACETAMINOPHEN 325 MG PO TABS
650.0000 mg | ORAL_TABLET | ORAL | Status: DC | PRN
  Administered 2013-05-06: 650 mg via ORAL
  Filled 2013-05-04: qty 2

## 2013-05-04 MED ORDER — DEXAMETHASONE 4 MG PO TABS
4.0000 mg | ORAL_TABLET | Freq: Four times a day (QID) | ORAL | Status: DC
  Administered 2013-05-04 – 2013-05-07 (×13): 4 mg via ORAL
  Filled 2013-05-04 (×15): qty 1

## 2013-05-04 MED ORDER — OXYCODONE HCL 5 MG PO TABS
ORAL_TABLET | ORAL | Status: AC
  Filled 2013-05-04: qty 1

## 2013-05-04 MED ORDER — METHOCARBAMOL 100 MG/ML IJ SOLN
500.0000 mg | Freq: Four times a day (QID) | INTRAVENOUS | Status: DC | PRN
  Filled 2013-05-04: qty 5

## 2013-05-04 MED ORDER — SENNA 8.6 MG PO TABS
1.0000 | ORAL_TABLET | Freq: Two times a day (BID) | ORAL | Status: DC
  Administered 2013-05-04 – 2013-05-07 (×6): 8.6 mg via ORAL
  Filled 2013-05-04 (×7): qty 1

## 2013-05-04 MED ORDER — MENTHOL 3 MG MT LOZG: 1.0000 | LOZENGE | OROMUCOSAL | Status: DC | PRN

## 2013-05-04 MED ORDER — PHENYLEPHRINE HCL 10 MG/ML IJ SOLN
10.0000 mg | INTRAVENOUS | Status: DC | PRN
  Administered 2013-05-04: 40 ug/min via INTRAVENOUS

## 2013-05-04 MED ORDER — OXYCODONE HCL 5 MG PO TABS
10.0000 mg | ORAL_TABLET | ORAL | Status: DC | PRN
  Administered 2013-05-04 – 2013-05-07 (×5): 10 mg via ORAL
  Filled 2013-05-04 (×5): qty 2

## 2013-05-04 MED ORDER — BIMATOPROST 0.01 % OP SOLN
1.0000 [drp] | Freq: Every day | OPHTHALMIC | Status: DC
Start: 2013-05-04 — End: 2013-05-07
  Administered 2013-05-04 – 2013-05-06 (×3): 1 [drp] via OPHTHALMIC
  Filled 2013-05-04: qty 2.5

## 2013-05-04 MED ORDER — SODIUM CHLORIDE 0.9 % IV SOLN
INTRAVENOUS | Status: DC | PRN
  Administered 2013-05-04 (×3): via INTRAVENOUS

## 2013-05-04 MED ORDER — MEPERIDINE HCL 25 MG/ML IJ SOLN: 6.2500 mg | INTRAMUSCULAR | Status: DC | PRN

## 2013-05-04 MED ORDER — CEFAZOLIN SODIUM-DEXTROSE 2-3 GM-% IV SOLR
INTRAVENOUS | Status: AC
  Filled 2013-05-04: qty 50

## 2013-05-04 MED ORDER — ACETAMINOPHEN 10 MG/ML IV SOLN
1000.0000 mg | Freq: Four times a day (QID) | INTRAVENOUS | Status: AC
  Administered 2013-05-04 – 2013-05-05 (×4): 1000 mg via INTRAVENOUS
  Filled 2013-05-04 (×4): qty 100

## 2013-05-04 MED ORDER — LIDOCAINE HCL (CARDIAC) 20 MG/ML IV SOLN
INTRAVENOUS | Status: DC | PRN
  Administered 2013-05-04: 60 mg via INTRAVENOUS

## 2013-05-04 MED ORDER — CEFAZOLIN SODIUM 1-5 GM-% IV SOLN
1.0000 g | Freq: Three times a day (TID) | INTRAVENOUS | Status: AC
  Administered 2013-05-04 – 2013-05-05 (×2): 1 g via INTRAVENOUS
  Filled 2013-05-04 (×2): qty 50

## 2013-05-04 MED ORDER — LACTATED RINGERS IV SOLN
INTRAVENOUS | Status: DC | PRN
  Administered 2013-05-04: 08:00:00 via INTRAVENOUS

## 2013-05-04 MED ORDER — 0.9 % SODIUM CHLORIDE (POUR BTL) OPTIME
TOPICAL | Status: DC | PRN
  Administered 2013-05-04: 1000 mL

## 2013-05-04 MED ORDER — METHOCARBAMOL 500 MG PO TABS
ORAL_TABLET | ORAL | Status: AC
  Filled 2013-05-04: qty 1

## 2013-05-04 MED ORDER — ISOSORBIDE MONONITRATE ER 30 MG PO TB24
30.0000 mg | ORAL_TABLET | Freq: Every morning | ORAL | Status: DC
  Administered 2013-05-05 – 2013-05-07 (×3): 30 mg via ORAL
  Filled 2013-05-04 (×3): qty 1

## 2013-05-04 MED ORDER — PANTOPRAZOLE SODIUM 40 MG PO TBEC
40.0000 mg | DELAYED_RELEASE_TABLET | Freq: Every morning | ORAL | Status: DC
  Administered 2013-05-05 – 2013-05-07 (×3): 40 mg via ORAL
  Filled 2013-05-04 (×3): qty 1

## 2013-05-04 MED ORDER — HYDROMORPHONE HCL PF 1 MG/ML IJ SOLN
INTRAMUSCULAR | Status: AC
  Filled 2013-05-04: qty 1

## 2013-05-04 MED ORDER — MIDAZOLAM HCL 2 MG/2ML IJ SOLN: 0.5000 mg | Freq: Once | INTRAMUSCULAR | Status: DC | PRN

## 2013-05-04 MED ORDER — DEXAMETHASONE SODIUM PHOSPHATE 10 MG/ML IJ SOLN
INTRAMUSCULAR | Status: AC
  Administered 2013-05-04: 10 mg via INTRAVENOUS
  Filled 2013-05-04: qty 1

## 2013-05-04 MED ORDER — ALBUMIN HUMAN 5 % IV SOLN
INTRAVENOUS | Status: DC | PRN
  Administered 2013-05-04 (×2): via INTRAVENOUS

## 2013-05-04 MED ORDER — MORPHINE SULFATE 2 MG/ML IJ SOLN: 1.0000 mg | INTRAMUSCULAR | Status: DC | PRN

## 2013-05-04 MED ORDER — ZOLPIDEM TARTRATE 5 MG PO TABS: 5.0000 mg | ORAL_TABLET | Freq: Every evening | ORAL | Status: DC | PRN

## 2013-05-04 MED ORDER — AMIODARONE HCL 100 MG PO TABS
100.0000 mg | ORAL_TABLET | Freq: Every morning | ORAL | Status: DC
  Administered 2013-05-05 – 2013-05-07 (×3): 100 mg via ORAL
  Filled 2013-05-04 (×3): qty 1

## 2013-05-04 MED ORDER — SODIUM CHLORIDE 0.9 % IR SOLN
Status: DC | PRN
  Administered 2013-05-04: 11:00:00

## 2013-05-04 MED ORDER — SODIUM CHLORIDE 0.9 % IV SOLN
INTRAVENOUS | Status: AC
  Filled 2013-05-04: qty 500

## 2013-05-04 MED ORDER — AMLODIPINE BESYLATE 10 MG PO TABS
10.0000 mg | ORAL_TABLET | Freq: Every day | ORAL | Status: DC
  Administered 2013-05-05 – 2013-05-07 (×3): 10 mg via ORAL
  Filled 2013-05-04 (×3): qty 1

## 2013-05-04 MED ORDER — CELECOXIB 200 MG PO CAPS
200.0000 mg | ORAL_CAPSULE | Freq: Two times a day (BID) | ORAL | Status: DC
  Administered 2013-05-04 – 2013-05-07 (×6): 200 mg via ORAL
  Filled 2013-05-04 (×7): qty 1

## 2013-05-04 MED ORDER — OXYCODONE HCL 5 MG PO TABS
5.0000 mg | ORAL_TABLET | Freq: Once | ORAL | Status: AC | PRN
  Administered 2013-05-04: 5 mg via ORAL

## 2013-05-04 MED ORDER — NITROGLYCERIN 0.4 MG SL SUBL: 0.4000 mg | SUBLINGUAL_TABLET | SUBLINGUAL | Status: DC | PRN

## 2013-05-04 MED ORDER — FUROSEMIDE 20 MG PO TABS
20.0000 mg | ORAL_TABLET | Freq: Every day | ORAL | Status: DC
  Administered 2013-05-04 – 2013-05-07 (×4): 20 mg via ORAL
  Filled 2013-05-04 (×4): qty 1

## 2013-05-04 MED ORDER — BACITRACIN 50000 UNITS IM SOLR
INTRAMUSCULAR | Status: AC
  Filled 2013-05-04: qty 1

## 2013-05-04 MED ORDER — MAGNESIUM CITRATE PO SOLN
1.0000 | Freq: Once | ORAL | Status: AC | PRN
  Filled 2013-05-04: qty 296

## 2013-05-04 MED ORDER — PHENOL 1.4 % MT LIQD: 1.0000 | OROMUCOSAL | Status: DC | PRN

## 2013-05-04 MED ORDER — CYCLOSPORINE 0.05 % OP EMUL
1.0000 [drp] | Freq: Two times a day (BID) | OPHTHALMIC | Status: DC
  Administered 2013-05-04 – 2013-05-07 (×6): 1 [drp] via OPHTHALMIC
  Filled 2013-05-04 (×7): qty 1

## 2013-05-04 MED ORDER — PROMETHAZINE HCL 25 MG/ML IJ SOLN: 6.2500 mg | INTRAMUSCULAR | Status: DC | PRN

## 2013-05-04 MED ORDER — SODIUM CHLORIDE 0.9 % IJ SOLN: 3.0000 mL | INTRAMUSCULAR | Status: DC | PRN

## 2013-05-04 SURGICAL SUPPLY — 67 items
APL SKNCLS STERI-STRIP NONHPOA (GAUZE/BANDAGES/DRESSINGS) ×2
BAG DECANTER FOR FLEXI CONT (MISCELLANEOUS) ×3 IMPLANT
BENZOIN TINCTURE PRP APPL 2/3 (GAUZE/BANDAGES/DRESSINGS) ×3 IMPLANT
BLADE SURG ROTATE 9660 (MISCELLANEOUS) IMPLANT
BONE MATRIX OSTEOCEL PRO LRG (Bone Implant) ×1 IMPLANT
BUR MATCHSTICK NEURO 3.0 LAGG (BURR) ×3 IMPLANT
CAGE COROENT MP 8X23 (Cage) ×2 IMPLANT
CANISTER SUCTION 2500CC (MISCELLANEOUS) ×3 IMPLANT
CAP LCK SPNE (Orthopedic Implant) ×22 IMPLANT
CAP LOCK SPINE RADIUS (Orthopedic Implant) IMPLANT
CAP LOCKING (Orthopedic Implant) ×33 IMPLANT
CLOTH BEACON ORANGE TIMEOUT ST (SAFETY) ×3 IMPLANT
CONT SPEC 4OZ CLIKSEAL STRL BL (MISCELLANEOUS) ×6 IMPLANT
COVER BACK TABLE 24X17X13 BIG (DRAPES) IMPLANT
COVER TABLE BACK 60X90 (DRAPES) ×3 IMPLANT
DRAPE C-ARM 42X72 X-RAY (DRAPES) ×6 IMPLANT
DRAPE C-ARMOR (DRAPES) ×1 IMPLANT
DRAPE LAPAROTOMY 100X72X124 (DRAPES) ×3 IMPLANT
DRAPE POUCH INSTRU U-SHP 10X18 (DRAPES) ×3 IMPLANT
DRAPE SURG 17X23 STRL (DRAPES) ×3 IMPLANT
DRESSING TELFA 8X3 (GAUZE/BANDAGES/DRESSINGS) ×4 IMPLANT
DRSG OPSITE 4X5.5 SM (GAUZE/BANDAGES/DRESSINGS) ×5 IMPLANT
DURAPREP 26ML APPLICATOR (WOUND CARE) ×3 IMPLANT
ELECT REM PT RETURN 9FT ADLT (ELECTROSURGICAL) ×3
ELECTRODE REM PT RTRN 9FT ADLT (ELECTROSURGICAL) ×2 IMPLANT
EVACUATOR 1/8 PVC DRAIN (DRAIN) ×3 IMPLANT
GAUZE SPONGE 4X4 16PLY XRAY LF (GAUZE/BANDAGES/DRESSINGS) IMPLANT
GLOVE BIO SURGEON STRL SZ8 (GLOVE) ×1 IMPLANT
GLOVE BIOGEL M 8.0 STRL (GLOVE) ×6 IMPLANT
GLOVE BIOGEL PI IND STRL 7.5 (GLOVE) IMPLANT
GLOVE BIOGEL PI IND STRL 8 (GLOVE) IMPLANT
GLOVE BIOGEL PI INDICATOR 7.5 (GLOVE) ×1
GLOVE BIOGEL PI INDICATOR 8 (GLOVE) ×3
GLOVE ECLIPSE 7.5 STRL STRAW (GLOVE) ×6 IMPLANT
GLOVE INDICATOR 8.5 STRL (GLOVE) ×1 IMPLANT
GOWN BRE IMP SLV AUR LG STRL (GOWN DISPOSABLE) ×1 IMPLANT
GOWN BRE IMP SLV AUR XL STRL (GOWN DISPOSABLE) ×7 IMPLANT
GOWN STRL REIN 2XL LVL4 (GOWN DISPOSABLE) ×2 IMPLANT
HEMOSTAT POWDER KIT SURGIFOAM (HEMOSTASIS) ×2 IMPLANT
KIT BASIN OR (CUSTOM PROCEDURE TRAY) ×3 IMPLANT
KIT ROOM TURNOVER OR (KITS) ×3 IMPLANT
MILL MEDIUM DISP (BLADE) ×1 IMPLANT
NDL HYPO 25X1 1.5 SAFETY (NEEDLE) ×2 IMPLANT
NEEDLE BONE MARROW 8GX6 FENEST (NEEDLE) ×1 IMPLANT
NEEDLE HYPO 25X1 1.5 SAFETY (NEEDLE) ×3 IMPLANT
NS IRRIG 1000ML POUR BTL (IV SOLUTION) ×3 IMPLANT
PACK FOAM VITOSS 10CC (Orthopedic Implant) ×2 IMPLANT
PACK LAMINECTOMY NEURO (CUSTOM PROCEDURE TRAY) ×3 IMPLANT
PAD ARMBOARD 7.5X6 YLW CONV (MISCELLANEOUS) ×11 IMPLANT
ROD CROSS LINK SHORT (Rod) ×1 IMPLANT
ROD HEX 180MM (Rod) ×2 IMPLANT
SCREW 5.75X40M (Screw) ×3 IMPLANT
SCREW 6.75X45MM (Screw) ×4 IMPLANT
SPONGE LAP 4X18 X RAY DECT (DISPOSABLE) IMPLANT
SPONGE SURGIFOAM ABS GEL 100 (HEMOSTASIS) ×3 IMPLANT
STRIP CLOSURE SKIN 1/2X4 (GAUZE/BANDAGES/DRESSINGS) ×5 IMPLANT
SUT VIC AB 0 CT1 18XCR BRD8 (SUTURE) ×2 IMPLANT
SUT VIC AB 0 CT1 8-18 (SUTURE) ×6
SUT VIC AB 2-0 CP2 18 (SUTURE) ×4 IMPLANT
SUT VIC AB 3-0 SH 8-18 (SUTURE) ×7 IMPLANT
SYR 20ML ECCENTRIC (SYRINGE) ×3 IMPLANT
SYR CONTROL 10ML LL (SYRINGE) ×2 IMPLANT
TOWEL OR 17X24 6PK STRL BLUE (TOWEL DISPOSABLE) ×3 IMPLANT
TOWEL OR 17X26 10 PK STRL BLUE (TOWEL DISPOSABLE) ×3 IMPLANT
TRAP SPECIMEN MUCOUS 40CC (MISCELLANEOUS) ×1 IMPLANT
TRAY FOLEY CATH 14FRSI W/METER (CATHETERS) ×2 IMPLANT
WATER STERILE IRR 1000ML POUR (IV SOLUTION) ×3 IMPLANT

## 2013-05-04 NOTE — Consult Note (Signed)
  DECLYN DELSOL is a 72 y.o.   05/04/2013  General  Preprocedure diagnosis: Urethral stricture  Postprocedure diagnosis: Same  Procedure done: Cystoscopy, urethral dilation, insertion of #16 French Foley catheter  Surgeon: Wendie Simmer. Kariah Loredo  Anesthesia: General  Indication: Patient is a 72 years old male who is scheduled for lumbar laminectomy. He has a past history of urethral stricture. I was asked to insert a Foley catheter. He had a urethral dilation by Dr. Annabell Howells in February 2007 and by Dr. Patsi Sears in February 2012.  Procedure: Patient was identified by his wrist band and proper timeout was taken.  Under general anesthesia patient was prepped and draped and placed in the supine position. The penis was prepped with Betadine solution. 2% Xylocaine jelly was instilled in the urethra. A flexible cystoscope was then passed in the urethra. There is a stricture in the bulbous urethra. I passed a sensor wire through the cystoscope into the bladder. I then dilated the urethra with Hayman dilators up to a #20 [place]. I then passed a #16 [place] council catheter over the sensor wire in the bladder. The balloon of the catheter was inflated with 10 cc of water. The catheter was then left to straight drainage.  Patient was left in the operating room table for lumbar laminectomy by Dr. Marikay Alar.

## 2013-05-04 NOTE — Op Note (Signed)
05/04/2013  2:49 PM  PATIENT:  [name]  72 y.o. 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 Decompressive lumbar laminectomy L2-3 requiring more work than would be required of the typical PLIF procedure in order to adequately decompress the neural elements.  2. Posterior lumbar interbody fusion L2-3 using PEEK interbody cages packed with morcellized allograft and autograft   3. Posterior fixation T11 to L4 using radius pedicle screw.  4. Intertransverse arthrodesis T11-L3 using morcellized autograft and allograft. 5. harvesting of iliac crest bone marrow through separate fascial incision 6. removal of hardware at L3-4 and exploration of fusion  SURGEON:  Marikay Alar, MD  ASSISTANTS: Dr. Venetia Maxon  ANESTHESIA:  General  EBL: 650 ml  Total I/O In: 4300 [I.V.:3600; Blood:200; IV Piggyback:500] Out: 850 [Urine:200; Blood:650]  BLOOD ADMINISTERED:none  DRAINS: Hemovac x2  INDICATION FOR PROCEDURE: This patient underwent previous fusions at L3-4 L4-5 and L5-S1 in the past. Presented with back and leg pain and was found to have adjacent level stenosis at L2-3. Also had a previous L1 compression fracture T12 and L1 were fused. He tried medical management without relief. Recommended a decompression and fusion from T11-L4. Patient understood the risks, benefits, and alternatives and potential outcomes and wished to proceed.  PROCEDURE DETAILS:  The patient was brought to the operating room. After induction of generalized endotracheal anesthesia the patient was rolled into the prone position on chest rolls and all pressure points were padded. The patient's lumbar region was cleaned and then prepped with DuraPrep and draped in the usual sterile fashion. Anesthesia was injected and then a dorsal  midline incision was made and carried down to the lumbosacral fascia. The fascia was opened and the paraspinous musculature was taken down in a subperiosteal fashion to expose T11-L4. The old hardware was exposed and the locking caps were removed as well as the rods. The screws had good purchase. I exposed the right iliac crest through a suprafascial exposure, opened the fascia over the iliac crest, and used it to be needle to extract about 20 cc of bone marrow aspirate to soak on morcellized allograft later arthrodesis.  Intraoperative fluoroscopy confirmed my level, and the spinous process was removed at L1 and L2 and complete lumbar laminectomies, hemi- facetectomies, and foraminotomies were performed at L2-3. The yellow ligament was removed to expose the underlying dura and nerve roots, and generous foraminotomies were performed to adequately decompress the neural elements. Once the decompression was complete, I turned my attention to the posterior lower lumbar interbody fusion. The epidural venous vasculature was coagulated and cut sharply. Disc space was incised and the initial discectomy was performed with pituitary rongeurs. The disc space was distracted with sequential distractors to a height of 9 mm. We then used a series of scrapers and shavers to prepare the endplates for fusion. The midline was prepared with Epstein curettes. Once the complete discectomy was finished, we packed an appropriate sized peek interbody cage with local autograft and morcellized allograft, gently retracted the nerve root, and tapped the cage into position at L2-3 bilaterally. The midline was packed with morselized autograft and allograft. We then turned our attention to the posterior fixation. The pedicle screw entry zones at T11, T12, L1,  and L2 were identified utilizing surface landmarks and fluoroscopy. We probed each pedicle with the pedicle  probe and tapped each pedicle with the appropriate tap. We palpated with a ball  probe to assure no break in the cortex. We then placed 67 5 x 45 mm pedicle screws into the pedicles bilaterally at T11-L2. We then decorticated the transverse processes and laid a mixture of morcellized autograft and allograft out over these to perform intertransverse arthrodesis at T11-L3. We then placed  rods into the multiaxial screw heads of the pedicle screws and locked these in position with the locking caps and anti-torque device. We then checked our construct with AP and lateral fluoroscopy. Irrigated with copious amounts of bacitracin-containing saline solution. Placed to medium Hemovac drains through separate stab incision. Inspected the nerve roots once again to assure adequate decompression, lined to the dura with Gelfoam, and closed the muscle and the fascia with 0 Vicryl. Closed the subcutaneous tissues with 2-0 Vicryl and subcuticular tissues with 3-0 Vicryl. The skin was closed with benzoin and Steri-Strips. Dressing was then applied, the patient was awakened from general anesthesia and transported to the recovery room in stable condition. At the end of the procedure all sponge, needle and instrument counts were correct.   PLAN OF CARE: Admit to inpatient   PATIENT DISPOSITION:  PACU - hemodynamically stable.   Delay start of Pharmacological VTE agent (>24hrs) due to surgical blood loss or risk of bleeding:  yes

## 2013-05-04 NOTE — Preoperative (Signed)
Beta Blockers   Reason not to administer Beta Blockers:Not Applicable 

## 2013-05-04 NOTE — OR Nursing (Signed)
Procedure 1 ended 10:20.

## 2013-05-04 NOTE — Transfer of Care (Signed)
Immediate Anesthesia Transfer of Care Note  Patient: LEKEITH WULF  Procedure(s) Performed: Procedure(s) with comments: POSTERIOR LUMBAR FUSION 1 LEVEL (N/A) CYSTOSCOPY WITH URETHRAL DILATATION (N/A) - with insertion of foley catheter  Patient Location: PACU  Anesthesia Type:General  Level of Consciousness: sedated  Airway & Oxygen Therapy: Patient Spontanous Breathing and Patient connected to nasal cannula oxygen  Post-op Assessment: Report given to PACU RN and Post -op Vital signs reviewed and stable  Post vital signs: Reviewed and stable  Complications: No apparent anesthesia complications

## 2013-05-04 NOTE — Anesthesia Procedure Notes (Signed)
Procedure Name: Intubation Date/Time: 05/04/2013 9:25 AM Performed by: Alanda Amass A Pre-anesthesia Checklist: Patient identified, Timeout performed, Emergency Drugs available, Suction available and Patient being monitored Patient Re-evaluated:Patient Re-evaluated prior to inductionOxygen Delivery Method: Circle system utilized Preoxygenation: Pre-oxygenation with 100% oxygen Intubation Type: IV induction Ventilation: Mask ventilation without difficulty Laryngoscope Size: Mac and 3 Grade View: Grade I Tube type: Oral Tube size: 7.5 mm Number of attempts: 1 Airway Equipment and Method: Stylet Placement Confirmation: ETT inserted through vocal cords under direct vision,  breath sounds checked- equal and bilateral and positive ETCO2 Secured at: 21 cm Tube secured with: Tape Dental Injury: Teeth and Oropharynx as per pre-operative assessment

## 2013-05-04 NOTE — H&P (Signed)
Subjective: Patient is a 72 y.o. male admitted for extension of lumbar fusion for lumbar spinal stenosis. Onset of symptoms was several months ago, gradually worsening since that time.  The pain is rated severe, and is located at the across the lower back and radiates to legs. The pain is described as aching and occurs intermittently. The symptoms have been progressive. Symptoms are exacerbated by exercise and standing. MRI or CT showed adjacent will stenosis L2-3.   Past Medical History  Diagnosis Date  . Colon polyp, hyperplastic   . Spondylolisthesis   . Hypertension   . CHF (congestive heart failure)   . Unstable angina 11/22/2011  . Respiratory failure, acute 11/22/2011  . CAD (coronary artery disease) 80% stenosis diag of the LAD, 30% in OM2 branch of LCX in 2009 11/22/2011  . Accelerated hypertension 11/22/2011  . Unstable angina 05/26/2012  . LV dysfunction, hx of EF 20% but most recent Echo 03/14/12 EF 35 -40%, life vest discontinued 05/26/2012  . Nonischemic cardiomyopathy   . Ventricular ectopy Hx of PVCs 11/22/2011  . 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 Myocardial infarction 11/22/11  . Acute bronchitis 05/26/12  . Exertional dyspnea   . Legally blind     "both eyes"  . 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 CKD (chronic kidney disease) stage 3, GFR 30-59 ml/min 05/27/2012  . Acute on chronic systolic CHF (congestive heart failure) 06/05/2012    Past Surgical History  Procedure Laterality Date  . Cystoscopy    . Rotator cuff repair  2000's    left  . Back surgery    . Cardiac catheterization  11/2011  . Posterior fusion lumbar spine  1979  . Cataract extraction, bilateral  1990's  . Ep study and ablation of vt  7/13    PVC focus mapped to the right coronary cusp of the aorta, limited ablation performed due to proximity of the focus to the right coronary artery  . Eye surgery      Prior to Admission medications    Medication Sig Start Date End Date Taking? Authorizing Provider  amiodarone (PACERONE) 200 MG tablet Take 100 mg by mouth every morning.    Historical Provider, MD  amLODipine (NORVASC) 10 MG tablet Take 1 tablet (10 mg total) by mouth daily. 11/27/11   Mihai Croitoru, MD  aspirin 325 MG tablet Take 325 mg by mouth every morning.     Historical Provider, MD  bimatoprost (LUMIGAN) 0.01 % SOLN Place 1 drop into both eyes at bedtime.    Historical Provider, MD  cycloSPORINE (RESTASIS) 0.05 % ophthalmic emulsion Place 1 drop into both eyes 2 (two) times daily.    Historical Provider, MD  furosemide (LASIX) 20 MG tablet Take 1 tablet (20 mg total) by mouth daily. 11/27/11   Mihai Croitoru, MD  isosorbide mononitrate (IMDUR) 30 MG 24 hr tablet Take 30 mg by mouth every morning.    Historical Provider, MD  nitroGLYCERIN (NITROSTAT) 0.4 MG SL tablet Place 1 tablet (0.4 mg total) under the tongue every 5 (five) minutes as needed for chest pain. 05/30/12 05/30/13  Nada Boozer, NP  pantoprazole (PROTONIX) 40 MG tablet Take 40 mg by mouth every morning.     Historical Provider, MD  pravastatin (PRAVACHOL) 40 MG tablet Take 40 mg by mouth every morning.     Historical Provider, MD  spironolactone (ALDACTONE) 25 MG tablet Take 25 mg by mouth every morning.  Historical Provider, MD   No Known Allergies  History  Substance Use Topics  . Smoking status: Former Smoker -- 50 years    Types: Cigarettes    Quit date: 11/23/1999  . Smokeless tobacco: Never Used  . 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   Review of Systems  Positive ROS: neg  All other systems have been reviewed and were otherwise negative with the exception of those mentioned in the HPI and as above.  Objective: Vital signs in last 24 hours:    General Appearance: Alert, cooperative, no distress, appears stated age Head: Normocephalic, without obvious  abnormality, atraumatic Eyes: PERRL, conjunctiva/corneas clear, EOM's intact     Neck: Supple, symmetrical, trachea midline Back: Symmetric, no curvature, ROM normal, no CVA tenderness Lungs:  respirations unlabored Heart: Regular rate and rhythm Abdomen: Soft, non-tender Extremities: Extremities normal, atraumatic, no cyanosis or edema Pulses: 2+ and symmetric all extremities Skin: Skin color, texture, turgor normal, no rashes or lesions  NEUROLOGIC:   Mental status: Alert and oriented x4,  no aphasia, good attention span, fund of knowledge, and memory Motor Exam - grossly normal Sensory Exam - grossly normal Reflexes: 1+ Coordination - grossly normal Gait - not tested Balance - not tested  Cranial Nerves: I: smell Not tested  II: visual acuity  OS: nl    OD: nl  II: visual fields Full to confrontation  II: pupils Equal, round, reactive to light  III,VII: ptosis None  III,IV,VI: extraocular muscles  Full ROM  V: mastication Normal  V: facial light touch sensation  Normal  V,VII: corneal reflex  Present  VII: facial muscle function - upper  Normal  VII: facial muscle function - lower Normal  VIII: hearing Not tested  IX: soft palate elevation  Normal  IX,X: gag reflex Present  XI: trapezius strength  5/5  XI: sternocleidomastoid strength 5/5  XI: neck flexion strength  5/5  XII: tongue strength  Normal    Data Review Lab Results  Component Value Date   WBC 4.7 04/30/2013   HGB 15.2 04/30/2013   HCT 44.7 04/30/2013   MCV 87.8 04/30/2013   PLT 146* 04/30/2013   Lab Results  Component Value Date   NA 140 04/30/2013   K 4.8 04/30/2013   CL 106 04/30/2013   CO2 25 04/30/2013   BUN 18 04/30/2013   CREATININE 1.83* 04/30/2013   GLUCOSE 112* 04/30/2013   Lab Results  Component Value Date   INR 1.27 05/27/2012    Assessment/Plan: Patient admitted for extension of lumbar fusion for adjacent level stenosis. Patient has failed conservative therapy.  I explained the condition and  procedure to the patient and answered any questions.  Patient wishes to proceed with procedure as planned. Understands risks/ benefits and typical outcomes of procedure.   Jalisha Enneking S 05/04/2013 7:14 AM

## 2013-05-04 NOTE — Anesthesia Postprocedure Evaluation (Signed)
  Anesthesia Post-op Note  Patient: [name]  Procedure(s) Performed: Procedure(s) with comments: POSTERIOR LUMBAR FUSION 1 LEVEL (N/A) CYSTOSCOPY WITH URETHRAL DILATATION (N/A) - with insertion of foley catheter  Patient Location: PACU  Anesthesia Type:General  Level of Consciousness: awake, alert , oriented and patient cooperative  Airway and Oxygen Therapy: Patient Spontanous Breathing and Patient connected to nasal cannula oxygen  Post-op Pain: mild  Post-op Assessment: Post-op Vital signs reviewed, Patient's Cardiovascular Status Stable, Respiratory Function Stable, Patent Airway, No signs of Nausea or vomiting and Pain level controlled  Post-op Vital Signs: Reviewed and stable  Complications: No apparent anesthesia complications

## 2013-05-05 ENCOUNTER — Encounter (HOSPITAL_COMMUNITY): Payer: Self-pay | Admitting: *Deleted

## 2013-05-05 LAB — CBC
HCT: 33 % — ABNORMAL LOW (ref 39.0–52.0)
MCHC: 35.2 g/dL (ref 30.0–36.0)
MCV: 85.5 fL (ref 78.0–100.0)
RDW: 12.6 % (ref 11.5–15.5)

## 2013-05-05 NOTE — Progress Notes (Signed)
Asked Dr Jeral Fruit if it was ok for me to remove foley cath since urology had to place it prior to surgery, he said it was fine to remove  Minor, [name]

## 2013-05-05 NOTE — Progress Notes (Signed)
Patient ID: [name], male   DOB: Apr 06, 1941, 72 y.o.   MRN: [id number] Oob, no weakness. Incisional pain. Pt to help

## 2013-05-05 NOTE — Plan of Care (Signed)
Problem: Consults Goal: Diagnosis - Spinal Surgery Outcome: Completed/Met Date Met:  05/05/13 Thoraco/Lumbar Spine Fusion

## 2013-05-05 NOTE — Evaluation (Signed)
Occupational Therapy Evaluation Patient Details Name: [name] MRN: [id number] DOB: Jun 13, 1941 Today's Date: 05/05/2013 Time: 4098-1191 OT Time Calculation (min): 22 min  OT Assessment / Plan / Recommendation Clinical Impression  Pt s/p PLIF 1 level thus affecting PLOF. Will benefit from continued OT services to address below problem list in prep for return home.    OT Assessment  Patient needs continued OT Services    Follow Up Recommendations  No OT follow up;Supervision - Intermittent    Barriers to Discharge None    Equipment Recommendations  3 in 1 bedside comode    Recommendations for Other Services    Frequency  Min 2X/week    Precautions / Restrictions Precautions Precautions: Fall;Back Required Braces or Orthoses: Spinal Brace Spinal Brace: Lumbar corset;Applied in sitting position Restrictions Weight Bearing Restrictions: No   Pertinent Vitals/Pain See vitals    ADL  Eating/Feeding: Performed;Modified independent Where Assessed - Eating/Feeding: Chair Grooming: Performed;Wash/dry hands;Supervision/safety Where Assessed - Grooming: Unsupported standing Upper Body Bathing: Simulated;Set up Where Assessed - Upper Body Bathing: Unsupported sitting Lower Body Bathing: Simulated;Minimal assistance Where Assessed - Lower Body Bathing: Unsupported sit to stand Upper Body Dressing: Performed;Set up Where Assessed - Upper Body Dressing: Unsupported sitting Lower Body Dressing: Performed;Minimal assistance Where Assessed - Lower Body Dressing: Unsupported sit to stand Toilet Transfer: Performed;Min guard Toilet Transfer Method:  (ambulating) Acupuncturist: Grab bars;Comfort height toilet Toileting - Clothing Manipulation and Hygiene: Performed;Minimal assistance Where Assessed - Engineer, mining and Hygiene: Standing Equipment Used: Back brace;Gait belt Transfers/Ambulation Related to ADLs: min guard for ambulation without device.  Slow gait but no LOB. ADL Comments: Pt able to cross ankles over knees but too stiff/sore at this time to reach feet quite enough for donning/doffing socks.      OT Diagnosis: Generalized weakness;Acute pain  OT Problem List: Decreased strength;Decreased activity tolerance;Decreased knowledge of use of DME or AE;Decreased knowledge of precautions;Pain OT Treatment Interventions: Self-care/ADL training;DME and/or AE instruction;Therapeutic activities;Patient/family education;Balance training   OT Goals Acute Rehab OT Goals OT Goal Formulation: With patient Time For Goal Achievement: 05/12/13 Potential to Achieve Goals: Good ADL Goals Pt Will Perform Lower Body Dressing: Sit to stand from chair;Sit to stand from bed;with modified independence ADL Goal: Lower Body Dressing - Progress: Goal set today Pt Will Transfer to Toilet: Ambulation;Comfort height toilet;Maintaining back safety precautions;with modified independence ADL Goal: Toilet Transfer - Progress: Goal set today Pt Will Perform Toileting - Clothing Manipulation: with modified independence;Standing ADL Goal: Toileting - Clothing Manipulation - Progress: Goal set today Pt Will Perform Toileting - Hygiene: with modified independence;Sit to stand from 3-in-1/toilet ADL Goal: Toileting - Hygiene - Progress: Goal set today Pt Will Perform Tub/Shower Transfer: Tub transfer;with supervision;Ambulation;Maintaining back safety precautions ADL Goal: Tub/Shower Transfer - Progress: Goal set today Miscellaneous OT Goals Miscellaneous OT Goal #1: Pt will perform bed mobility at mod I level as precursor for EOB ADLs. OT Goal: Miscellaneous Goal #1 - Progress: Goal set today  Visit Information  Last OT Received On: 05/05/13 Assistance Needed: +1 PT/OT Co-Evaluation/Treatment: Yes    Subjective Data      Prior Functioning     Home Living Lives With: Significant other Available Help at Discharge: Family;Available  PRN/intermittently Type of Home: Apartment Home Access: Stairs to enter Entrance Stairs-Number of Steps: 2 Entrance Stairs-Rails: Can reach both Home Layout: One level Bathroom Shower/Tub: Engineer, manufacturing systems: Standard Home Adaptive Equipment: Walker - rolling;Straight cane Additional Comments: girlfriend works 2-3 days a week for  about 4 hours Prior Function Level of Independence: Independent Able to Take Stairs?: Yes Driving: No Vocation: Retired Comments: doesn't do much during  Musician: No difficulties         Vision/Perception Vision - History Baseline Vision: Therapist, art Arousal/Alertness: Awake/alert Behavior During Therapy: WFL for tasks assessed/performed Overall Cognitive Status: Within Functional Limits for tasks assessed    Extremity/Trunk Assessment Right Upper Extremity Assessment RUE ROM/Strength/Tone: WFL for tasks assessed Left Upper Extremity Assessment LUE ROM/Strength/Tone: WFL for tasks assessed Right Lower Extremity Assessment RLE ROM/Strength/Tone: WFL for tasks assessed Left Lower Extremity Assessment LLE ROM/Strength/Tone: WFL for tasks assessed Trunk Assessment Trunk Assessment: Normal     Mobility Bed Mobility Bed Mobility: Sit to Sidelying Left;Rolling Right Rolling Right: 5: Supervision Sit to Sidelying Left: 4: Min assist;HOB flat;With rail Details for Bed Mobility Assistance: assist to elevate legs and v/c's for log roll technique and efficiency Transfers Transfers: Stand to Sit;Sit to Stand Sit to Stand: 4: Min guard;From toilet;With upper extremity assist;From chair/3-in-1 Stand to Sit: 5: Supervision;To toilet;To bed;With upper extremity assist Details for Transfer Assistance: slow to rise due to stiffness, gaurding for safety and stability     Exercise     Balance     End of Session OT - End of Session Equipment Utilized During Treatment: Gait belt;Back brace Activity  Tolerance: Patient tolerated treatment well Patient left: in bed;with call bell/phone within reach;with bed alarm set Nurse Communication: Mobility status  GO   05/05/2013 Cipriano Mile OTR/L Pager [phone] Office [phone]   Cipriano Mile 05/05/2013, 9:34 AM

## 2013-05-05 NOTE — Evaluation (Signed)
Physical Therapy Evaluation Patient Details Name: [name] MRN: [id number] DOB: 26-Sep-1941 Today's Date: 05/05/2013 Time: 4098-1191 PT Time Calculation (min): 22 min  PT Assessment / Plan / Recommendation Clinical Impression  72 y/o male s/p spinal fusion. Presents to PT today moving well, fairly knowledgeable of safe mobility from prior surgeries however needs re-education as he has forgotten some. Will benefit physical therapy in the acute setting to maximize safe mobility and independence prior to d/c home. I expect he will progress well without need for f/u PT. Patient educated on importance of ambulating with nursing staff to improve strength and activity tolerance.     PT Assessment  Patient needs continued PT services    Follow Up Recommendations  No PT follow up;Supervision - Intermittent    Does the patient have the potential to tolerate intense rehabilitation      Barriers to Discharge        Equipment Recommendations  None recommended by PT    Recommendations for Other Services     Frequency Min 5X/week    Precautions / Restrictions Precautions Precautions: Fall;Back Required Braces or Orthoses: Spinal Brace Spinal Brace: Lumbar corset;Applied in sitting position Restrictions Weight Bearing Restrictions: No   Pertinent Vitals/Pain Reorts minimal 3/10 back pain      Mobility  Bed Mobility Bed Mobility: Sit to Sidelying Left;Rolling Right Rolling Right: 5: Supervision Sit to Sidelying Left: 4: Min assist;HOB flat;With rail Details for Bed Mobility Assistance: assist to elevate legs and v/c's for log roll technique and efficiency Transfers Transfers: Sit to Stand;Stand to Sit Sit to Stand: With upper extremity assist;4: Min guard;From chair/3-in-1;With armrests Stand to Sit: 5: Supervision;With upper extremity assist;To toilet Details for Transfer Assistance: slow to rise due to stiffness, gaurding for safety and  stability Ambulation/Gait Ambulation/Gait Assistance: 5: Supervision Ambulation Distance (Feet): 200 Feet Assistive device: None Ambulation/Gait Assistance Details: cues for increased speed, gaurding for safety Gait Pattern: Step-through pattern;Decreased stride length;Wide base of support;Decreased trunk rotation Gait velocity: decreased Stairs: No         PT Diagnosis: Difficulty walking;Acute pain  PT Problem List: Decreased mobility;Pain;Decreased knowledge of precautions;Decreased activity tolerance PT Treatment Interventions: DME instruction;Gait training;Stair training;Functional mobility training;Therapeutic activities;Therapeutic exercise;Patient/family education   PT Goals Acute Rehab PT Goals PT Goal Formulation: With patient Time For Goal Achievement: 05/12/13 Potential to Achieve Goals: Good Pt will Roll Supine to Left Side: with modified independence PT Goal: Rolling Supine to Left Side - Progress: Goal set today Pt will go Supine/Side to Sit: with modified independence PT Goal: Supine/Side to Sit - Progress: Goal set today Pt will go Sit to Supine/Side: with modified independence PT Goal: Sit to Supine/Side - Progress: Goal set today Pt will go Sit to Stand: with modified independence PT Goal: Sit to Stand - Progress: Goal set today Pt will go Stand to Sit: with modified independence PT Goal: Stand to Sit - Progress: Goal set today Pt will Ambulate: >150 feet;with modified independence;with least restrictive assistive device PT Goal: Ambulate - Progress: Goal set today Pt will Go Up / Down Stairs: 1-2 stairs;with rail(s);with modified independence PT Goal: Up/Down Stairs - Progress: Goal set today Additional Goals Additional Goal #1: Pt will verbalize and demonstrate understanding of 3/3 back precautions.  PT Goal: Additional Goal #1 - Progress: Goal set today  Visit Information  Last PT Received On: 05/05/13 Assistance Needed: +1    Subjective Data   Subjective: This isn't my first back surgery.  Patient Stated Goal: home  Prior Functioning  Home Living Lives With: Significant other Available Help at Discharge: Family;Available PRN/intermittently Type of Home: Apartment Home Access: Stairs to enter Entrance Stairs-Number of Steps: 2 Entrance Stairs-Rails: Can reach both Home Layout: One level Bathroom Shower/Tub: Engineer, manufacturing systems: Standard Home Adaptive Equipment: Environmental consultant - rolling;Straight cane Additional Comments: girlfriend works 2-3 days a week for about 4 hours Prior Function Level of Independence: Independent Able to Take Stairs?: Yes Driving: No Vocation: Retired Comments: doesn't do much during  Musician: No difficulties    Cognition  Cognition Arousal/Alertness: Awake/alert Behavior During Therapy: WFL for tasks assessed/performed Overall Cognitive Status: Within Functional Limits for tasks assessed    Extremity/Trunk Assessment Right Upper Extremity Assessment RUE ROM/Strength/Tone: [facility] for tasks assessed Left Upper Extremity Assessment LUE ROM/Strength/Tone: WFL for tasks assessed Right Lower Extremity Assessment RLE ROM/Strength/Tone: [facility] for tasks assessed Left Lower Extremity Assessment LLE ROM/Strength/Tone: WFL for tasks assessed Trunk Assessment Trunk Assessment: Normal   Balance    End of Session PT - End of Session Equipment Utilized During Treatment: Gait belt Activity Tolerance: Patient tolerated treatment well Patient left: in bed;with call bell/phone within reach  GP     [facility] [name] 05/05/2013, 9:30 AM

## 2013-05-06 NOTE — Progress Notes (Signed)
Occupational Therapy Treatment Patient Details Name: [name] MRN: [id number] DOB: 06/23/1941 Today's Date: 05/06/2013 Time: 9562-1308 OT Time Calculation (min): 11 min  OT Assessment / Plan / Recommendation Comments on Treatment Session Pt making excellent progress.  Performed tub transfer and functional mobility at supervision level.    Follow Up Recommendations  No OT follow up;Supervision - Intermittent    Barriers to Discharge       Equipment Recommendations  3 in 1 bedside comode    Recommendations for Other Services    Frequency Min 2X/week   Plan Discharge plan remains appropriate    Precautions / Restrictions Precautions Precautions: Fall;Back Precaution Comments: Pt able to recall 3/3 back precautions (in his verbage, but correct concept/description) Required Braces or Orthoses: Spinal Brace Spinal Brace: Lumbar corset;Applied in sitting position Restrictions Weight Bearing Restrictions: No   Pertinent Vitals/Pain See vitals    ADL  Lower Body Bathing: Simulated;Supervision/safety Where Assessed - Lower Body Bathing: Unsupported sit to stand Lower Body Dressing: Performed;Supervision/safety Where Assessed - Lower Body Dressing: Unsupported sit to stand Toilet Transfer: Simulated;Supervision/safety Toilet Transfer Method: Sit to Barista:  (chair) Tub/Shower Transfer: Engineer, manufacturing Method: Ambulating Equipment Used: Gait belt;Back brace Transfers/Ambulation Related to ADLs: supervision for ambulation without device ADL Comments: Pt able to don/doff socks this morning by crossing ankles over knees (too sore/stiff last session).  Pt demo'ing good safety awareness throughout session.  Side stepped into tub with hands supported on wall (supervision for safety).  Pt reporting he does not want a shower chair but will have wife assist with washing legs in order to avoid bending.    OT Diagnosis:    OT  Problem List:   OT Treatment Interventions:     OT Goals ADL Goals Pt Will Perform Lower Body Dressing: Sit to stand from chair;Sit to stand from bed;with modified independence ADL Goal: Lower Body Dressing - Progress: Progressing toward goals Pt Will Transfer to Toilet: Ambulation;Comfort height toilet;Maintaining back safety precautions;with modified independence ADL Goal: Toilet Transfer - Progress: Progressing toward goals Pt Will Perform Tub/Shower Transfer: Tub transfer;with supervision;Ambulation;Maintaining back safety precautions ADL Goal: Tub/Shower Transfer - Progress: Progressing toward goals Miscellaneous OT Goals Miscellaneous OT Goal #1: Pt will perform bed mobility at mod I level as precursor for EOB ADLs. OT Goal: Miscellaneous Goal #1 - Progress: Progressing toward goals  Visit Information  Last OT Received On: 05/06/13 Assistance Needed: +1    Subjective Data      Prior Functioning       Cognition  Cognition Arousal/Alertness: Awake/alert Behavior During Therapy: WFL for tasks assessed/performed Overall Cognitive Status: Within Functional Limits for tasks assessed    Mobility  Bed Mobility Bed Mobility: Not assessed (pt up in chair) Transfers Transfers: Sit to Stand;Stand to Sit Sit to Stand: 5: Supervision;From chair/3-in-1;With upper extremity assist;With armrests Stand to Sit: 5: Supervision;To chair/3-in-1;With armrests;With upper extremity assist Details for Transfer Assistance: Incr time due to stiffness. Supervision for safety.    Exercises      Balance     End of Session OT - End of Session Equipment Utilized During Treatment: Back brace Activity Tolerance: Patient tolerated treatment well Patient left: in chair;with call bell/phone within reach Nurse Communication: Mobility status  GO   05/06/2013 Cipriano Mile OTR/L Pager [phone] Office [phone]   Cipriano Mile 05/06/2013, 8:53 AM

## 2013-05-06 NOTE — Progress Notes (Signed)
Physical Therapy Treatment Patient Details Name: [name] MRN: [id number] DOB: Jul 06, 1941 Today's Date: 05/06/2013 Time: 8295-6213 PT Time Calculation (min): 11 min  PT Assessment / Plan / Recommendation Comments on Treatment Session  Pt is 72 y/o male admitted for s/p lumbar fusion.  Pt moving well and able to increase ambulation distance and perform stair negotiation.  Pt close to meeting all goals and safe to d/c home from PT standpoint.    Follow Up Recommendations  No PT follow up;Supervision - Intermittent     Does the patient have the potential to tolerate intense rehabilitation     Barriers to Discharge        Equipment Recommendations  None recommended by PT    Recommendations for Other Services    Frequency Min 5X/week   Plan Discharge plan remains appropriate;Frequency remains appropriate    Precautions / Restrictions Precautions Precautions: Fall;Back Precaution Comments: Pt able to recall 3/3 back precautions (in his verbage, but correct concept/description) Required Braces or Orthoses: Spinal Brace Spinal Brace: Lumbar corset;Applied in sitting position Restrictions Weight Bearing Restrictions: No   Pertinent Vitals/Pain C/o pain with transition movements sit <> stand 4/10 but no pain with ambulation    Mobility  Bed Mobility Bed Mobility: Not assessed (pt up in chair) Transfers Transfers: Sit to Stand;Stand to Sit Sit to Stand: 5: Supervision;From chair/3-in-1;With upper extremity assist;With armrests Stand to Sit: 5: Supervision;To chair/3-in-1;With armrests;With upper extremity assist Details for Transfer Assistance: Incr time due to stiffness. Supervision for safety. Ambulation/Gait Ambulation/Gait Assistance: 6: Modified independent (Device/Increase time) Ambulation Distance (Feet): 300 Feet Assistive device: None Ambulation/Gait Assistance Details: Mod (I) due to increase time needed Gait Pattern: Step-through pattern;Decreased stride  length;Wide base of support;Decreased trunk rotation Gait velocity: decreased Stairs: Yes Stairs Assistance: 4: Min guard Stairs Assistance Details (indicate cue type and reason): Minguard for safety with cues for hand and LE placement Stair Management Technique: One rail Right;Step to pattern;Forwards Number of Stairs: 5    Exercises     PT Diagnosis:    PT Problem List:   PT Treatment Interventions:     PT Goals Acute Rehab PT Goals PT Goal Formulation: With patient Time For Goal Achievement: 05/12/13 Potential to Achieve Goals: Good Pt will go Sit to Stand: with modified independence PT Goal: Sit to Stand - Progress: Progressing toward goal Pt will go Stand to Sit: with modified independence PT Goal: Stand to Sit - Progress: Progressing toward goal Pt will Ambulate: >150 feet;with modified independence;with least restrictive assistive device PT Goal: Ambulate - Progress: Met Pt will Go Up / Down Stairs: 1-2 stairs;with rail(s);with modified independence PT Goal: Up/Down Stairs - Progress: Progressing toward goal Additional Goals Additional Goal #1: Pt will verbalize and demonstrate understanding of 3/3 back precautions.  PT Goal: Additional Goal #1 - Progress: Progressing toward goal  Visit Information  Last PT Received On: 05/06/13 Assistance Needed: +1    Subjective Data  Subjective: "I can walk further." Patient Stated Goal: home   Cognition  Cognition Arousal/Alertness: Awake/alert Behavior During Therapy: WFL for tasks assessed/performed Overall Cognitive Status: Within Functional Limits for tasks assessed    Balance     End of Session PT - End of Session Equipment Utilized During Treatment: Gait belt Activity Tolerance: Patient tolerated treatment well Patient left: in chair;with call bell/phone within reach Nurse Communication: Mobility status   GP     Shjon Lizarraga 05/06/2013, 11:11 AM  Jake Shark, PT DPT [phone]

## 2013-05-06 NOTE — Progress Notes (Signed)
Subjective: Patient reports getting better  Objective: Vital signs in last 24 hours: Temp:  [97.9 F (36.6 C)-98.5 F (36.9 C)] 98.5 F (36.9 C) (06/15 0200) Pulse Rate:  [60-67] 65 (06/15 0200) Resp:  [16-20] 18 (06/15 0200) BP: (121-152)/(63-76) 136/76 mmHg (06/15 0200) SpO2:  [99 %-100 %] 99 % (06/15 0200)  Intake/Output from previous day: 06/14 0701 - 06/15 0700 In: -  Out: 475 [Urine:150; Drains:325] Intake/Output this shift:    Physical Exam: Strength full in both legs, Dressing CDI with minimal spotting.  Hemovac 325cc last shift.  Lab Results:  Recent Labs  05/04/13 1431 05/05/13 0356  WBC  --  10.7*  HGB 12.2* 11.6*  HCT 36.0* 33.0*  PLT  --  136*   BMET  Recent Labs  05/04/13 1431  NA 142  K 5.0  GLUCOSE 130*    Studies/Results: Dg Thoracolumbar Spine  05/04/2013   *RADIOLOGY REPORT*  Clinical Data: Back pain  DG C-ARM 61-120 MIN,THORACOLUMBAR SPINE - 2 VIEW  Technique:  C-arm fluoroscopic images were obtained intraoperatively and submitted for postoperative interpretation. Please see the performing provider's procedural report for the fluoroscopy time utilized.  Comparison: CT myelogram 04/10/2013.  Findings: C-arm films document extension of fusion from L4-T11.  IMPRESSION: As above.   Original Report Authenticated By: Davonna Belling, M.D.   Dg C-arm 61-120 Min  05/04/2013   *RADIOLOGY REPORT*  Clinical Data: Back pain  DG C-ARM 61-120 MIN,THORACOLUMBAR SPINE - 2 VIEW  Technique:  C-arm fluoroscopic images were obtained intraoperatively and submitted for postoperative interpretation. Please see the performing provider's procedural report for the fluoroscopy time utilized.  Comparison: CT myelogram 04/10/2013.  Findings: C-arm films document extension of fusion from L4-T11.  IMPRESSION: As above.   Original Report Authenticated By: Davonna Belling, M.D.    Assessment/Plan: Continue to mobilize with PT.  Continue drain today. Adequate analgesia.    LOS: 2  days    Dorian Heckle, MD 05/06/2013, 7:34 AM

## 2013-05-07 MED ORDER — METHOCARBAMOL 500 MG PO TABS: 500.0000 mg | ORAL_TABLET | Freq: Four times a day (QID) | ORAL | Status: DC | PRN

## 2013-05-07 MED ORDER — OXYCODONE HCL 10 MG PO TABS: 10.0000 mg | ORAL_TABLET | ORAL | Status: DC | PRN

## 2013-05-07 MED FILL — Sodium Chloride IV Soln 0.9%: INTRAVENOUS | Qty: 1000 | Status: AC

## 2013-05-07 MED FILL — Heparin Sodium (Porcine) Inj 1000 Unit/ML: INTRAMUSCULAR | Qty: 30 | Status: AC

## 2013-05-07 MED FILL — Sodium Chloride Irrigation Soln 0.9%: Qty: 3000 | Status: AC

## 2013-05-07 NOTE — Care Management Note (Signed)
    Page 1 of 1   05/08/2013     8:16:21 AM   CARE MANAGEMENT NOTE 05/08/2013  Patient:  [name], [name]   Account Number:  [id number]  Date Initiated:  05/04/2013  Documentation initiated by:  [facility]  Subjective/Objective Assessment:   admitted postop PLIF L2-3     Action/Plan:   Anticipated DC Date:  05/07/2013   Anticipated DC Plan:  HOME/SELF CARE      DC Planning Services  CM consult      Choice offered to / List presented to:     DME arranged  3-N-1      DME agency  [facility]        Status of service:  Completed, signed off Medicare Important Message given?   (If response is "NO", the following Medicare IM given date fields will be blank) Date Medicare IM given:   Date Additional Medicare IM given:    Discharge Disposition:  HOME/SELF CARE  Per UR Regulation:  Reviewed for med. necessity/level of care/duration of stay  If discussed at Long Length of Stay Meetings, dates discussed:    Comments:  05/07/13 1035 Elmer Bales RN, MSN CM- Met with patient to discuss discharge needs.  Pt states that he already has a wheeled walker at home, but needs a 3N1.  [facility] D[facility] was notified of need and possible discharge today.

## 2013-05-07 NOTE — Discharge Summary (Signed)
Physician Discharge Summary  Patient ID: [name] MRN: [id number] DOB/AGE: 1941-06-01 72 y.o.  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 The patient tolerated the procedure well and was taken to the recovery room and then to the floor in stable condition. The hospital course was routine. There were no complications. The wound remained clean dry and intact. Pt had appropriate back soreness. No complaints of leg pain or new N/T/W. The patient remained afebrile with stable vital signs, and tolerated a regular diet. The patient continued to increase activities, and pain was well controlled with oral pain medications.   Consults: None  Significant Diagnostic Studies:  Results for orders placed during the hospital encounter of 05/04/13  PROTIME-INR      Result Value Range   Prothrombin Time 14.9  11.6 - 15.2 seconds   INR 1.19  0.00 - 1.49  CBC      Result Value Range   WBC 10.7 (*) 4.0 - 10.5 K/uL   RBC 3.86 (*) 4.22 - 5.81 MIL/uL   Hemoglobin 11.6 (*) 13.0 - 17.0 g/dL   HCT 40.9 (*) 81.1 - 91.4 %   MCV 85.5  78.0 - 100.0 fL   MCH 30.1  26.0 - 34.0 pg   MCHC 35.2  30.0 - 36.0 g/dL   RDW 78.2  95.6 - 21.3 %   Platelets 136 (*) 150 - 400 K/uL  POCT I-STAT 4, (NA,K, GLUC, HGB,HCT)      Result Value Range   Sodium 142  135 - 145 mEq/L   Potassium 5.0  3.5 - 5.1 mEq/L   Glucose, Bld 130 (*) 70 - 99 mg/dL   HCT 08.6 (*) 57.8 - 46.9 %   Hemoglobin 12.2 (*) 13.0 - 17.0 g/dL    Dg Thoracolumbar Spine  05/04/2013   *RADIOLOGY REPORT*  Clinical Data: Back pain  DG C-ARM 61-120 MIN,THORACOLUMBAR SPINE - 2 VIEW  Technique:  C-arm fluoroscopic images were obtained intraoperatively and submitted for postoperative  interpretation. Please see the performing provider's procedural report for the fluoroscopy time utilized.  Comparison: CT myelogram 04/10/2013.  Findings: C-arm films document extension of fusion from L4-T11.  IMPRESSION: As above.   Original Report Authenticated By: Davonna Belling, M.D.   Ct Lumbar Spine W Contrast  04/10/2013   *RADIOLOGY REPORT*  MYELOGRAM INJECTION  Technique:  Informed consent was obtained from the patient prior to the procedure, including potential complications of headache, allergy, infection and pain. Specific instructions were given regarding 24 hour bedrest post procedure to prevent post-LP headache.  A timeout procedure was performed.  With the patient prone, the lower back was prepped with Betadine.  1% Lidocaine was used for local anesthesia.  Lumbar puncture was performed by the radiologist at the L4-L5 level using a 22 gauge needle with return of clear CSF. I personally performed the lumbar puncture and administered the intrathecal contrast. I also personally supervised acquisition of the myelogram images. 15 cc of Omnipaque 180 was injected into the subarachnoid space .  IMPRESSION: Successful injection of  intrathecal contrast for myelography.  MYELOGRAM LUMBAR  Clinical Data:  Low back pain.  Bilateral leg pain.  Comparison: Multiple priors.  Most recent MR 12/02/2010.  Findings: There is evidence of a remote L4-S1 decompression and interbody fusion.  This fusion is solid.  More recently, 01/15/2011, patient with removal of the lower hardware and treatment of adjacent segment disease at L3-L4 with posterolateral interbody fusion.  This fusion also appears solid and hardware is intact.  There is moderate stenosis at L2-L3, which is multifactorial.  Mild central bulging annular fibers is accompanied by 3 mm retrolisthesis and posterior element hypertrophy.  No significant worsening with the patient standing throughout flexion and extension.  Bilateral L3 nerve root impingement is  identified, worse on the right.  The distal aspect of the thecal sac is somewhat smooth, and the nerve roots are not symmetrically located and slight nerve root thickening particularly on the right.  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  I believe these changes are relatively mild.  Fluoroscopy Time: 1 minute 15 seconds.  IMPRESSION: As above.  CT MYELOGRAPHY LUMBAR SPINE  Technique: Multidetector CT imaging of the lumbar spine was performed following myelography.  Multiplanar CT image reconstructions were also generated.  Findings:  No prevertebral or paraspinous masses.  Nonaneurysmal atherosclerotic calcification of the aorta.  T12-L1:  Disc space narrowing with fusion, spontaneous or post traumatic.  No prior instrumentation.  Previous L1 compression fractures well healed.  L1-2: Mild bulge.  Mild facet arthropathy.  No stenosis or disc protrusion. Minimal retropulsion L1.  Conus is not compressed.  L2-3: 3 mm retrolisthesis.  Vacuum disc phenomenon.  Shallow central protrusion is present along with facet and ligamentum flavum hypertrophy.  Mild some canal stenosis.  Bilateral L3 nerve root impingement. Bilateral foraminal narrowing is likely to affect both L2 nerve roots.  L3-4: Solid fusion.  No impingement.  Slight thickening of the nerve roots on the right.  L4-5: Solid fusion.  No impingement.  Horizontal configuration of the lower lumbar nerve roots is not typical of the normal configuration within the thecal sac, suggesting arachnoiditis.  L5-S1: Solid fusion.  No impingement.  IMPRESSION: Solid fusion L3-S1.  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  Bilateral neural foraminal narrowing likely to affect both L2 nerve roots as well.   Original Report Authenticated By: Davonna Belling, M.D.   Dg Myelogram Lumbar  04/10/2013   *RADIOLOGY REPORT*  MYELOGRAM INJECTION  Technique:  Informed consent was obtained from the patient prior to the procedure,  including potential complications of headache, allergy, infection and pain. Specific instructions were given regarding 24 hour bedrest post procedure to prevent post-LP headache.  A timeout procedure was performed.  With the patient prone, the lower back was prepped with Betadine.  1% Lidocaine was used for local anesthesia.  Lumbar puncture was performed by the radiologist at the L4-L5 level using a 22 gauge needle with return of clear CSF. I personally performed the lumbar puncture and administered the intrathecal contrast. I also personally supervised acquisition of the myelogram images. 15 cc of Omnipaque 180 was injected into the subarachnoid space .  IMPRESSION: Successful injection of  intrathecal contrast for myelography.  MYELOGRAM LUMBAR  Clinical Data:  Low back pain.  Bilateral leg pain.  Comparison: Multiple priors.  Most recent MR 12/02/2010.  Findings: There is evidence of a remote L4-S1 decompression and interbody fusion.  This fusion is solid.  More recently, 01/15/2011, patient with removal of the lower hardware and treatment of adjacent segment disease at L3-L4 with posterolateral interbody fusion.  This fusion also appears solid and hardware is intact.  There is moderate stenosis at L2-L3, which is multifactorial.  Mild central bulging annular fibers is accompanied  by 3 mm retrolisthesis and posterior element hypertrophy.  No significant worsening with the patient standing throughout flexion and extension.  Bilateral L3 nerve root impingement is identified, worse on the right.  The distal aspect of the thecal sac is somewhat smooth, and the nerve roots are not symmetrically located and slight nerve root thickening particularly on the right.  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  I believe these changes are relatively mild.  Fluoroscopy Time: 1 minute 15 seconds.  IMPRESSION: As above.  CT MYELOGRAPHY LUMBAR SPINE  Technique: Multidetector CT imaging of the lumbar spine was  performed following myelography.  Multiplanar CT image reconstructions were also generated.  Findings:  No prevertebral or paraspinous masses.  Nonaneurysmal atherosclerotic calcification of the aorta.  T12-L1:  Disc space narrowing with fusion, spontaneous or post traumatic.  No prior instrumentation.  Previous L1 compression fractures well healed.  L1-2: Mild bulge.  Mild facet arthropathy.  No stenosis or disc protrusion. Minimal retropulsion L1.  Conus is not compressed.  L2-3: 3 mm retrolisthesis.  Vacuum disc phenomenon.  Shallow central protrusion is present along with facet and ligamentum flavum hypertrophy.  Mild some canal stenosis.  Bilateral L3 nerve root impingement. Bilateral foraminal narrowing is likely to affect both L2 nerve roots.  L3-4: Solid fusion.  No impingement.  Slight thickening of the nerve roots on the right.  L4-5: Solid fusion.  No impingement.  Horizontal configuration of the lower lumbar nerve roots is not typical of the normal configuration within the thecal sac, suggesting arachnoiditis.  L5-S1: Solid fusion.  No impingement.  IMPRESSION: Solid fusion L3-S1.  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  Bilateral neural foraminal narrowing likely to affect both L2 nerve roots as well.   Original Report Authenticated By: Davonna Belling, M.D.   Dg C-arm 61-120 Min  05/04/2013   *RADIOLOGY REPORT*  Clinical Data: Back pain  DG C-ARM 61-120 MIN,THORACOLUMBAR SPINE - 2 VIEW  Technique:  C-arm fluoroscopic images were obtained intraoperatively and submitted for postoperative interpretation. Please see the performing provider's procedural report for the fluoroscopy time utilized.  Comparison: CT myelogram 04/10/2013.  Findings: C-arm films document extension of fusion from L4-T11.  IMPRESSION: As above.   Original Report Authenticated By: Davonna Belling, M.D.    Antibiotics:  Anti-infectives   Start     Dose/Rate Route Frequency Ordered Stop   05/04/13 2230   ceFAZolin (ANCEF) IVPB 1 g/50 mL premix     1 g 100 mL/hr over 30 Minutes Intravenous Every 8 hours 05/04/13 1658 05/05/13 0636   05/04/13 1425  ceFAZolin (ANCEF) 2-3 GM-% IVPB SOLR    Comments:  DAYE, DEVONIA: cabinet override      05/04/13 1425 05/05/13 0229   05/04/13 1110  bacitracin 50,000 Units in sodium chloride irrigation 0.9 % 500 mL irrigation  Status:  Discontinued       As needed 05/04/13 1110 05/04/13 1446   05/04/13 0904  bacitracin 56213 UNITS injection    Comments:  DAY, DORY: cabinet override      05/04/13 0904 05/04/13 2114   05/04/13 0723  ceFAZolin (ANCEF) 2-3 GM-% IVPB SOLR    Comments:  WILHELM, JENNIFER: cabinet override      05/04/13 0723 05/04/13 1929   05/04/13 0600  ceFAZolin (ANCEF) IVPB 2 g/50 mL premix     2 g 100 mL/hr over 30 Minutes Intravenous On call to O.R. 05/03/13 1421 05/04/13 1427      Discharge Exam: Blood pressure  128/70, pulse 65, temperature 97.1 F (36.2 C), temperature source Oral, resp. rate 18, height 5\' 7"  (1.702 m), weight 83.5 kg (184 lb 1.4 oz), SpO2 100.00%. Resp: clear to auscultation bilaterally Neurologic exam grossly normal Incision clean and intact  Discharge Medications:     Medication List    TAKE these medications       amiodarone 200 MG tablet  Commonly known as:  PACERONE  Take 100 mg by mouth every morning.     amLODipine 10 MG tablet  Commonly known as:  NORVASC  Take 1 tablet (10 mg total) by mouth daily.     aspirin 325 MG tablet  Take 325 mg by mouth every morning.     cycloSPORINE 0.05 % ophthalmic emulsion  Commonly known as:  RESTASIS  Place 1 drop into both eyes 2 (two) times daily.     furosemide 20 MG tablet  Commonly known as:  LASIX  Take 1 tablet (20 mg total) by mouth daily.     isosorbide mononitrate 30 MG 24 hr tablet  Commonly known as:  IMDUR  Take 30 mg by mouth every morning.     LUMIGAN 0.01 % Soln  Generic drug:  bimatoprost  Place 1 drop into both eyes at bedtime.      methocarbamol 500 MG tablet  Commonly known as:  ROBAXIN  Take 1 tablet (500 mg total) by mouth every 6 (six) hours as needed.     nitroGLYCERIN 0.4 MG SL tablet  Commonly known as:  NITROSTAT  Place 1 tablet (0.4 mg total) under the tongue every 5 (five) minutes as needed for chest pain.     Oxycodone HCl 10 MG Tabs  Take 1 tablet (10 mg total) by mouth every 4 (four) hours as needed for pain.     pantoprazole 40 MG tablet  Commonly known as:  PROTONIX  Take 40 mg by mouth every morning.     pravastatin 40 MG tablet  Commonly known as:  PRAVACHOL  Take 40 mg by mouth every morning.     spironolactone 25 MG tablet  Commonly known as:  ALDACTONE  Take 25 mg by mouth every morning.        Disposition: Home   Final Dx: Thoracolumbar fusion      Discharge Orders   Future Appointments Provider Department Dept Phone   05/22/2013 8:00 AM [facility] [address] [phone]   Future Orders Complete By Expires     Call MD for:  difficulty breathing, headache or visual disturbances  As directed     Call MD for:  persistant nausea and vomiting  As directed     Call MD for:  redness, tenderness, or signs of infection (pain, swelling, redness, odor or green/yellow discharge around incision site)  As directed     Call MD for:  severe uncontrolled pain  As directed     Call MD for:  temperature >100.4  As directed     Diet - low sodium heart healthy  As directed     Discharge instructions  As directed     Comments:      No heavy lifting or bending or twisting, no driving, may shower normally    Increase activity slowly  As directed        Follow-up Information   Follow up with Jaison Petraglia S, MD. Schedule an appointment as soon as possible for a visit in 2 weeks.   Contact information:  [address] [place] [address] [phone]        Signed: Tia Alert 05/07/2013, 5:59 PM

## 2013-05-07 NOTE — Progress Notes (Signed)
Physical Therapy Treatment Patient Details Name: [name] MRN: [id number] DOB: 11-14-1941 Today's Date: 05/07/2013 Time: 6962-9528 PT Time Calculation (min): 18 min  PT Assessment / Plan / Recommendation Comments on Treatment Session  Pt is 72 y/o male admitted for s/p lumbar fusion.  Pt moving well and able to increase ambulation distance and perform stair negotiation. Close to meeting goals.    Follow Up Recommendations  No PT follow up;Supervision - Intermittent     Does the patient have the potential to tolerate intense rehabilitation     Barriers to Discharge        Equipment Recommendations  None recommended by PT    Recommendations for Other Services    Frequency Min 5X/week   Plan Discharge plan remains appropriate;Frequency remains appropriate    Precautions / Restrictions Precautions Precautions: Fall;Back Precaution Comments: Pt able to recall 3/3 back precautions (in his verbage, but correct concept/description) Required Braces or Orthoses: Spinal Brace Spinal Brace: Lumbar corset;Applied in sitting position   Pertinent Vitals/Pain Denies pain    Mobility  Bed Mobility Bed Mobility: Sit to Sidelying Left;Rolling Right;Rolling Left;Left Sidelying to Sit Rolling Right: 6: Modified independent (Device/Increase time) Rolling Left: 6: Modified independent (Device/Increase time) Left Sidelying to Sit: 6: Modified independent (Device/Increase time) Sit to Sidelying Left: 5: Supervision Details for Bed Mobility Assistance: cues for efficiency Transfers Transfers: Stand to Sit;Sit to Stand Sit to Stand: 5: Supervision Stand to Sit: 6: Modified independent (Device/Increase time) Details for Transfer Assistance: Incr time due to stiffness. Supervision for safety. Ambulation/Gait Ambulation/Gait Assistance: 6: Modified independent (Device/Increase time) Ambulation Distance (Feet): 600 Feet Assistive device: None Ambulation/Gait Assistance Details: less able to  adapt to dynamic changes but modifies his speed accordingly, no LOB; occasional cues to decrease twisting when talking to people in the hallway Gait Pattern: Step-through pattern;Decreased stride length;Wide base of support;Decreased trunk rotation Gait velocity: decreased Stairs: Yes Stairs Assistance: 5: Supervision Stairs Assistance Details (indicate cue type and reason): cues for safety Stair Management Technique: Two rails;Alternating pattern Number of Stairs: 10      PT Goals Acute Rehab PT Goals PT Goal: Rolling Supine to Left Side - Progress: Met PT Goal: Supine/Side to Sit - Progress: Met PT Goal: Sit to Supine/Side - Progress: Progressing toward goal PT Goal: Sit to Stand - Progress: Progressing toward goal PT Goal: Stand to Sit - Progress: Met PT Goal: Up/Down Stairs - Progress: Progressing toward goal Additional Goals PT Goal: Additional Goal #1 - Progress: Progressing toward goal  Visit Information  Last PT Received On: 05/07/13 Assistance Needed: +1    Subjective Data  Subjective: I move slower because of pain.  Patient Stated Goal: home   Cognition  Cognition Arousal/Alertness: Awake/alert Behavior During Therapy: WFL for tasks assessed/performed Overall Cognitive Status: Within Functional Limits for tasks assessed    Balance     End of Session PT - End of Session Equipment Utilized During Treatment: Gait belt Activity Tolerance: Patient tolerated treatment well Patient left: in chair;with call bell/phone within reach Nurse Communication: Mobility status   GP     [facility] HELEN 05/07/2013, 8:58 AM

## 2013-05-07 NOTE — Progress Notes (Signed)
Patient d/c this evening per MD, assessments remained unchanged prior to discharge. Pt and family member educated on the need to follow up and call the MD when the need arises.

## 2013-05-08 ENCOUNTER — Encounter (HOSPITAL_COMMUNITY): Payer: Self-pay | Admitting: Neurological Surgery

## 2013-05-22 ENCOUNTER — Ambulatory Visit (HOSPITAL_COMMUNITY): Payer: Medicare Other

## 2013-05-29 ENCOUNTER — Ambulatory Visit (HOSPITAL_COMMUNITY)
Admission: RE | Admit: 2013-05-29 | Discharge: 2013-05-29 | Disposition: A | Discharge status: Home / Self Care | Payer: Medicare Other | Source: Ambulatory Visit | Attending: Cardiovascular Disease | Admitting: Cardiovascular Disease

## 2013-05-29 DIAGNOSIS — I428 Other cardiomyopathies: Secondary | ICD-10-CM

## 2013-06-13 ENCOUNTER — Other Ambulatory Visit: Payer: Self-pay | Admitting: Cardiovascular Disease

## 2013-06-13 ENCOUNTER — Other Ambulatory Visit: Payer: Self-pay | Admitting: Cardiology

## 2013-06-26 ENCOUNTER — Encounter: Payer: Self-pay | Admitting: *Deleted

## 2013-06-26 ENCOUNTER — Encounter: Payer: Self-pay | Admitting: Cardiovascular Disease

## 2013-06-27 ENCOUNTER — Encounter: Payer: Self-pay | Admitting: Cardiovascular Disease

## 2013-06-27 ENCOUNTER — Ambulatory Visit (INDEPENDENT_AMBULATORY_CARE_PROVIDER_SITE_OTHER): Payer: Medicare Other | Admitting: Cardiovascular Disease

## 2013-06-27 VITALS — BP 142/86 | HR 64 | Ht 67.0 in | Wt 166.2 lb

## 2013-06-27 DIAGNOSIS — I441 Atrioventricular block, second degree: Secondary | ICD-10-CM

## 2013-06-27 DIAGNOSIS — I429 Cardiomyopathy, unspecified: Secondary | ICD-10-CM

## 2013-06-27 DIAGNOSIS — I2510 Atherosclerotic heart disease of native coronary artery without angina pectoris: Secondary | ICD-10-CM

## 2013-06-27 NOTE — Progress Notes (Signed)
Patient ID: [name], male   DOB: 1941-01-24, 72 y.o.   MRN: [id number]     HPI: [name], is a 72 y.o. male three-month followup cardiology evaluation.  [name] is a 72 year old American gentleman who has a history of a dilated cardiomyopathy with remote ejection fractions in the 35-45% range. The December 2012 he presented with acute congestive heart failure requiring BiPAP therapy and during his hospitalization ejection fraction dropped to 20-25%. He in for non-ST segment elevation MI and was not found to have high-grade obstructive CAD. He did wear life as transiently with improvement of his ejection fraction to approximately 35-40% in April 2013 of life as was discontinued. He also has a history of intermittent second-degree AV block lead leading to discontinuance of his beta blocker therapy and reduction of his amiodarone dose. Will he underwent a recent echo Doppler study in March 2014. This now revealed an ejection fraction had increased to approximately 55%. 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  [name] has felt well. Specifically, he denies PND orthopnea or shortness of breath. He presents for evaluation.  Past Medical History  Diagnosis Date  . Colon polyp, hyperplastic   . Spondylolisthesis   . Hypertension   . CHF (congestive heart failure)   . Unstable angina 11/22/2011  . Respiratory failure, acute 11/22/2011  . CAD (coronary artery disease) 80% stenosis diag of the LAD, 30% in OM2 branch of LCX in 2009 11/22/2011  . Accelerated hypertension 11/22/2011  . Unstable angina 05/26/2012  . LV dysfunction, hx of EF 20% but most recent Echo 03/14/12 EF 35 -40%, life vest discontinued 05/26/2012  . Nonischemic cardiomyopathy   . Ventricular ectopy Hx of PVCs 11/22/2011  . 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 Myocardial infarction 11/22/11  . Acute bronchitis 05/26/12  . Exertional dyspnea   . Legally blind     "both eyes"    . 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 CKD (chronic kidney disease) stage 3, GFR 30-59 ml/min 05/27/2012  . Acute on chronic systolic CHF (congestive heart failure) 06/05/2012  . History of stress test 06/01/2012    Normal myocardial perfusion study. compared to the previous study there is no significant change. this is a low risk scan    Past Surgical History  Procedure Laterality Date  . Cystoscopy    . Rotator cuff repair  2000's    left  . Back surgery    . Cardiac catheterization  11/2011  . Posterior fusion lumbar spine  1979  . Cataract extraction, bilateral  1990's  . Ep study and ablation of vt  7/13    PVC focus mapped to the right coronary cusp of the aorta, limited ablation performed due to proximity of the focus to the right coronary artery  . Eye surgery    . Cystoscopy with urethral dilatation N/A 05/04/2013    Procedure: CYSTOSCOPY WITH URETHRAL DILATATION;  Surgeon: Tia Alert, MD;  Location: [facility];  Service: Neurosurgery;  Laterality: N/A;  with insertion of foley catheter  . Cardiac catheterization  11/2011    didn't demonstrate high grade obstructive disease to account for his LV dysfunction.    No Known Allergies  Current Outpatient Prescriptions  Medication Sig Dispense Refill  . amiodarone (PACERONE) 200 MG tablet Take 100 mg by mouth every morning.      Marland Kitchen amLODipine (NORVASC) 10 MG tablet Take 1 tablet (10 mg  total) by mouth daily.  30 tablet  11  . aspirin 325 MG tablet Take 325 mg by mouth every morning.       . bimatoprost (LUMIGAN) 0.01 % SOLN Place 1 drop into both eyes at bedtime.      . cycloSPORINE (RESTASIS) 0.05 % ophthalmic emulsion Place 1 drop into both eyes 2 (two) times daily.      . furosemide (LASIX) 20 MG tablet Take 1 tablet (20 mg total) by mouth daily.  30 tablet  11  . isosorbide mononitrate (IMDUR) 30 MG 24 hr tablet TAKE 1 TABLET BY MOUTH DAILY  30 tablet  11  . methocarbamol (ROBAXIN) 500 MG tablet Take 1 tablet  (500 mg total) by mouth every 6 (six) hours as needed.  60 tablet  1  . nitroGLYCERIN (NITROSTAT) 0.4 MG SL tablet Place 1 tablet (0.4 mg total) under the tongue every 5 (five) minutes as needed for chest pain.  25 tablet  4  . oxyCODONE 10 MG TABS Take 1 tablet (10 mg total) by mouth every 4 (four) hours as needed for pain.  90 tablet  0  . pantoprazole (PROTONIX) 40 MG tablet Take 40 mg by mouth every morning.       . pravastatin (PRAVACHOL) 40 MG tablet Take 40 mg by mouth every morning.       Marland Kitchen spironolactone (ALDACTONE) 25 MG tablet TAKE 1/2 TABLET BY MOUTH TWICE DAILY  45 tablet  3  . traMADol (ULTRAM) 50 MG tablet Take 1 tablet by mouth every 6 (six) hours.       No current facility-administered medications for this visit.    History   Social History  . Marital Status: Legally Separated    Spouse Name: N/A    Number of Children: 3  . Years of Education: N/A   Occupational History  . Retired    Social History Main Topics  . Smoking status: Former Smoker -- 50 years    Types: Cigarettes    Quit date: 11/23/1999  . Smokeless tobacco: Never Used  . 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 Drug Use: No  . Sexually Active: Yes   Other Topics Concern  . Not on file   Social History Narrative  . No narrative on file      ROS is negative for fevers, chills or night sweats. Denies palpitations. He denies presyncope or syncope. He denies wheezing. Low back discomfort has been wearing a back brace. He denies significant edema. There is no bleeding to  Other system review is negative.  PE BP 142/86  Pulse 64  Ht 5\' 7"  (1.702 m)  Wt 166 lb 3.2 oz (75.388 kg)  BMI 26.02 kg/m2  General: Alert, oriented, no distress.  Skin: normal turgor, no rashes HEENT: Normocephalic, atraumatic. Pupils round and reactive; sclera anicteric;no lid lag.  Nose without nasal septal hypertrophy Mouth/Parynx benign; Mallinpatti scale  3 Neck: No JVD, no carotid briuts Lungs: clear to ausculatation and percussion; no wheezing or rales Heart: RRR, s1 s2 normal 1/6 systolic murmur. No S3 or S4 gallop  Abdomen: soft, nontender; no hepatosplenomehaly, BS+; abdominal aorta nontender and not dilated by palpation. Pulses 2+ Extremities: no clubbing cyanosis or edema, Homan's sign negative  Neurologic: grossly nonfocal  ECG: Normal sinus rhythm at 64 beats per minute with first-degree AV block. Voltaren 6 ms. QTC interval 464 ms.  LABS:  BMET  Component Value Date/Time   NA 142 05/04/2013 1431   K 5.0 05/04/2013 1431   CL 106 04/30/2013 0839   CO2 25 04/30/2013 0839   GLUCOSE 130* 05/04/2013 1431   BUN 18 04/30/2013 0839   CREATININE 1.83* 04/30/2013 0839   CALCIUM 9.4 04/30/2013 0839   GFRNONAA 35* 04/30/2013 0839   GFRAA 40* 04/30/2013 0839     Hepatic Function Panel     Component Value Date/Time   PROT 8.5* 03/19/2013 1749   ALBUMIN 4.1 03/19/2013 1749   AST 33 03/19/2013 1749   ALT 22 03/19/2013 1749   ALKPHOS 64 03/19/2013 1749   BILITOT 0.5 03/19/2013 1749   BILIDIR 0.1 03/19/2013 1749   IBILI 0.4 03/19/2013 1749     CBC    Component Value Date/Time   WBC 10.7* 05/05/2013 0356   RBC 3.86* 05/05/2013 0356   HGB 11.6* 05/05/2013 0356   HCT 33.0* 05/05/2013 0356   PLT 136* 05/05/2013 0356   MCV 85.5 05/05/2013 0356   MCH 30.1 05/05/2013 0356   MCHC 35.2 05/05/2013 0356   RDW 12.6 05/05/2013 0356   LYMPHSABS 1.8 04/30/2013 0839   MONOABS 0.5 04/30/2013 0839   EOSABS 0.1 04/30/2013 0839   BASOSABS 0.0 04/30/2013 0839     BNP    Component Value Date/Time   PROBNP 155.4* 03/19/2013 1749    Lipid Panel     Component Value Date/Time   CHOL 101 05/27/2012 0650   TRIG 52 05/27/2012 0650   HDL 51 05/27/2012 0650   CHOLHDL 2.0 05/27/2012 0650   VLDL 10 05/27/2012 0650   LDLCALC 40 05/27/2012 0650     RADIOLOGY: No results found.    ASSESSMENT AND PLAN: Clinically [name] remains asymptomatic his current medical regimen. His most  recent echo Doppler study in March 2014 now shows normalization of LV function. He is not having any ectopy. There is no chest pain or shortness of breath. I have recommended he continue his current medical regimen. I will see him in 6 months for followup cardiology evaluation.    Lennette Bihari, MD, [facility]  06/27/2013 8:52 AM

## 2013-06-27 NOTE — Patient Instructions (Signed)
Your physician recommends that you schedule a follow-up appointment in: 6 MONTHS. No changes has been made today in your therapy. 

## 2013-07-23 ENCOUNTER — Other Ambulatory Visit: Payer: Self-pay | Admitting: Cardiovascular Disease

## 2013-07-24 NOTE — Telephone Encounter (Signed)
Rx was sent to pharmacy electronically. 

## 2013-12-29 ENCOUNTER — Emergency Department (HOSPITAL_COMMUNITY)
Admission: EM | Admit: 2013-12-29 | Discharge: 2013-12-29 | Disposition: A | Discharge status: Home / Self Care | Payer: Medicare Other | Attending: Emergency Medicine | Admitting: Emergency Medicine

## 2013-12-29 ENCOUNTER — Encounter (HOSPITAL_COMMUNITY): Payer: Self-pay | Admitting: Emergency Medicine

## 2013-12-29 ENCOUNTER — Emergency Department (HOSPITAL_COMMUNITY): Payer: Medicare Other

## 2013-12-29 DIAGNOSIS — M545 Low back pain, unspecified: Secondary | ICD-10-CM | POA: Insufficient documentation

## 2013-12-29 DIAGNOSIS — S1093XA Contusion of unspecified part of neck, initial encounter: Principal | ICD-10-CM

## 2013-12-29 DIAGNOSIS — I129 Hypertensive chronic kidney disease with stage 1 through stage 4 chronic kidney disease, or unspecified chronic kidney disease: Secondary | ICD-10-CM | POA: Insufficient documentation

## 2013-12-29 DIAGNOSIS — I200 Unstable angina: Secondary | ICD-10-CM | POA: Insufficient documentation

## 2013-12-29 DIAGNOSIS — N183 Chronic kidney disease, stage 3 unspecified: Secondary | ICD-10-CM | POA: Insufficient documentation

## 2013-12-29 DIAGNOSIS — Z860100 Personal history of colon polyps, unspecified: Secondary | ICD-10-CM | POA: Insufficient documentation

## 2013-12-29 DIAGNOSIS — W010XXA Fall on same level from slipping, tripping and stumbling without subsequent striking against object, initial encounter: Secondary | ICD-10-CM | POA: Insufficient documentation

## 2013-12-29 DIAGNOSIS — S0083XA Contusion of other part of head, initial encounter: Secondary | ICD-10-CM

## 2013-12-29 DIAGNOSIS — S0990XA Unspecified injury of head, initial encounter: Secondary | ICD-10-CM

## 2013-12-29 DIAGNOSIS — I5023 Acute on chronic systolic (congestive) heart failure: Secondary | ICD-10-CM | POA: Insufficient documentation

## 2013-12-29 DIAGNOSIS — I252 Old myocardial infarction: Secondary | ICD-10-CM | POA: Insufficient documentation

## 2013-12-29 DIAGNOSIS — Z87891 Personal history of nicotine dependence: Secondary | ICD-10-CM | POA: Insufficient documentation

## 2013-12-29 DIAGNOSIS — Z8709 Personal history of other diseases of the respiratory system: Secondary | ICD-10-CM | POA: Insufficient documentation

## 2013-12-29 DIAGNOSIS — G8929 Other chronic pain: Secondary | ICD-10-CM | POA: Insufficient documentation

## 2013-12-29 DIAGNOSIS — Y9301 Activity, walking, marching and hiking: Secondary | ICD-10-CM | POA: Insufficient documentation

## 2013-12-29 DIAGNOSIS — Z9889 Other specified postprocedural states: Secondary | ICD-10-CM | POA: Insufficient documentation

## 2013-12-29 DIAGNOSIS — S0003XA Contusion of scalp, initial encounter: Secondary | ICD-10-CM | POA: Insufficient documentation

## 2013-12-29 DIAGNOSIS — Z79899 Other long term (current) drug therapy: Secondary | ICD-10-CM | POA: Insufficient documentation

## 2013-12-29 DIAGNOSIS — I2510 Atherosclerotic heart disease of native coronary artery without angina pectoris: Secondary | ICD-10-CM | POA: Insufficient documentation

## 2013-12-29 DIAGNOSIS — Y929 Unspecified place or not applicable: Secondary | ICD-10-CM | POA: Insufficient documentation

## 2013-12-29 DIAGNOSIS — M47812 Spondylosis without myelopathy or radiculopathy, cervical region: Secondary | ICD-10-CM | POA: Insufficient documentation

## 2013-12-29 DIAGNOSIS — Z7982 Long term (current) use of aspirin: Secondary | ICD-10-CM | POA: Insufficient documentation

## 2013-12-29 NOTE — ED Notes (Signed)
Vital signs stable. 

## 2013-12-29 NOTE — ED Notes (Signed)
Hooked pt back up to monitor after returning from radiology

## 2013-12-29 NOTE — ED Provider Notes (Signed)
CSN: [id number]     Arrival date & time 12/29/13  1019 History   First MD Initiated Contact with Patient 12/29/13 1034     Chief Complaint  Patient presents with  . Fall   (Consider location/radiation/quality/duration/timing/severity/associated sxs/prior Treatment) Patient is a 73 y.o. male presenting with fall. The history is provided by the patient.  Fall   He, states that he fell yesterday, while walking on an on, even sidewalk. He believes that he tripped, causing him to fall. He presents now for evaluation of soreness and swelling of the right forehead and right cheek. There was no loss of consciousness. He, states that his neck hurts, after the fall. He has chronic low back pain that is unchanged. He denies nausea, vomiting, weakness, dizziness. There are no other known modifying factors.  Past Medical History  Diagnosis Date  . Colon polyp, hyperplastic   . Spondylolisthesis   . Hypertension   . CHF (congestive heart failure)   . Unstable angina 11/22/2011  . Respiratory failure, acute 11/22/2011  . CAD (coronary artery disease) 80% stenosis diag of the LAD, 30% in OM2 branch of LCX in 2009 11/22/2011  . Accelerated hypertension 11/22/2011  . Unstable angina 05/26/2012  . LV dysfunction, hx of EF 20% but most recent Echo 03/14/12 EF 35 -40%, life vest discontinued 05/26/2012  . Nonischemic cardiomyopathy   . Ventricular ectopy Hx of PVCs 11/22/2011  . 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 Myocardial infarction 11/22/11  . Acute bronchitis 05/26/12  . Exertional dyspnea   . Legally blind     "both eyes"  . 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 CKD (chronic kidney disease) stage 3, GFR 30-59 ml/min 05/27/2012  . Acute on chronic systolic CHF (congestive heart failure) 06/05/2012  . History of stress test 06/01/2012    Normal myocardial perfusion study. compared to the previous study there is no significant change. this is a low risk  scan   Past Surgical History  Procedure Laterality Date  . Cystoscopy    . Rotator cuff repair  2000's    left  . Back surgery    . Cardiac catheterization  11/2011  . Posterior fusion lumbar spine  1979  . Cataract extraction, bilateral  1990's  . Ep study and ablation of vt  7/13    PVC focus mapped to the right coronary cusp of the aorta, limited ablation performed due to proximity of the focus to the right coronary artery  . Eye surgery    . Cystoscopy with urethral dilatation N/A 05/04/2013    Procedure: CYSTOSCOPY WITH URETHRAL DILATATION;  Surgeon: Eustace Moore, MD;  Location: [facility];  Service: Neurosurgery;  Laterality: N/A;  with insertion of foley catheter  . Cardiac catheterization  11/2011    didn't demonstrate high grade obstructive disease to account for his LV dysfunction.   History reviewed. No pertinent family history. History  Substance Use Topics  . Smoking status: Former Smoker -- 50 years    Types: Cigarettes    Quit date: 11/23/1999  . Smokeless tobacco: Never Used  . 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  Home Medications   Current Outpatient Rx  Name  Route  Sig  Dispense  Refill  . amiodarone (  PACERONE) 200 MG tablet   Oral   Take 100 mg by mouth every morning.         Marland Kitchen amLODipine (NORVASC) 10 MG tablet   Oral   Take 10 mg by mouth every morning.         Marland Kitchen aspirin 325 MG tablet   Oral   Take 325 mg by mouth every morning.          . bimatoprost (LUMIGAN) 0.01 % SOLN   Both Eyes   Place 1 drop into both eyes at bedtime.         . cycloSPORINE (RESTASIS) 0.05 % ophthalmic emulsion   Both Eyes   Place 1 drop into both eyes 2 (two) times daily.         . furosemide (LASIX) 20 MG tablet   Oral   Take 1 tablet (20 mg total) by mouth daily.   30 tablet   11   . isosorbide  mononitrate (IMDUR) 30 MG 24 hr tablet   Oral   Take 30 mg by mouth every morning.         . methocarbamol (ROBAXIN) 500 MG tablet   Oral   Take 500 mg by mouth every 6 (six) hours as needed for muscle spasms.         . nitroGLYCERIN (NITROSTAT) 0.4 MG SL tablet   Sublingual   Place 1 tablet (0.4 mg total) under the tongue every 5 (five) minutes as needed for chest pain.   25 tablet   4   . oxyCODONE 10 MG TABS   Oral   Take 1 tablet (10 mg total) by mouth every 4 (four) hours as needed for pain.   90 tablet   0   . pantoprazole (PROTONIX) 40 MG tablet   Oral   Take 40 mg by mouth every morning.          . pravastatin (PRAVACHOL) 40 MG tablet   Oral   Take 40 mg by mouth every morning.          Marland Kitchen spironolactone (ALDACTONE) 25 MG tablet   Oral   Take 12.5 mg by mouth 2 (two) times daily.         . traMADol (ULTRAM) 50 MG tablet   Oral   Take 50 mg by mouth every 6 (six) hours as needed for moderate pain.           BP 135/74  Pulse 52  Temp(Src) 98.1 F (36.7 C) (Oral)  Resp 18  SpO2 97% Physical Exam  Nursing note and vitals reviewed. Constitutional: He is oriented to person, place, and time. He appears well-developed and well-nourished.  HENT:  Head: Normocephalic.  Right Ear: External ear normal.  Left Ear: External ear normal.  Mild right forehead and right zygomatic tenderness, without crepitation or deformity  Eyes: Conjunctivae and EOM are normal. Pupils are equal, round, and reactive to light.  Neck: Normal range of motion and phonation normal. Neck supple.  Cardiovascular: Normal rate, regular rhythm, normal heart sounds and intact distal pulses.   Pulmonary/Chest: Effort normal and breath sounds normal. He exhibits no bony tenderness.  Abdominal: Soft. Normal appearance. There is no tenderness.  Musculoskeletal: Normal range of motion.  Neurological: He is alert and oriented to person, place, and time. No cranial nerve deficit or sensory  deficit. He exhibits normal muscle tone. Coordination normal.  Skin: Skin is warm, dry and intact.  Psychiatric: He has a normal mood and affect. His  behavior is normal. Judgment and thought content normal.    ED Course  Procedures (including critical care time) Medications - No data to display  Patient Vitals for the past 24 hrs:  BP Temp Temp src Pulse Resp SpO2  12/29/13 1255 135/74 mmHg 98.1 F (36.7 C) Oral 52 18 97 %  12/29/13 1245 133/78 mmHg - - 58 24 98 %  12/29/13 1230 127/73 mmHg - - 55 16 97 %  12/29/13 1215 - - - 57 16 98 %  12/29/13 1200 134/77 mmHg - - 55 15 100 %  12/29/13 1145 123/69 mmHg - - 56 18 97 %  12/29/13 1130 136/73 mmHg - - 57 - 100 %  12/29/13 1100 142/74 mmHg - - 59 26 99 %  12/29/13 1045 135/80 mmHg - - 58 23 100 %  12/29/13 1030 156/83 mmHg 97.8 F (36.6 C) Oral 59 18 99 %    10:52 AM Reevaluation with update and discussion. After initial assessment and treatment, an updated evaluation reveals PE unchanged. No further c/o. Findings discussed with patient, all questions answered. Irene Review Labs Reviewed - No data to display Imaging Review Ct Head Wo Contrast  12/29/2013   CLINICAL DATA:  Pain status post trauma  EXAM: CT HEAD AND ORBITS WITHOUT AND WITH CONTRAST  TECHNIQUE: Contiguous axial images were obtained from the base of the skull through the vertex with and without contrast. Multidetector CT imaging of the orbits was performed using the standard protocol with and without intravenous contrast.  COMPARISON:  None.  FINDINGS: CT HEAD FINDINGS  There is no evidence of extra-axial fluid collections nor acute hemorrhage. Global atrophy. Basal ganglial calcifications. Mild areas of low attenuation within the subcortical, deep, and periventricular white matter regions. There is no evidence of mass effect. There is no evidence of a depressed skull fracture. Mucosal thickening is appreciated within the ethmoid air cells. Remaining  visualized paranasal sinuses and mastoid air cells are patent. Small dystrophic calcification projects along the lateral periphery of the left cerebellar hemisphere may represent sequela of prior infection.  CT ORBITS FINDINGS  A periorbital hematoma is appreciated along the superior medial aspect of the right orbit. There is preseptal involvement without evidence of postseptal extension. The intraconal and extraconal fat is homogeneous. The extraocular musculature is unremarkable symmetric would correlate with the left without evidence reflecting edema or infiltration. The globe demonstrates homogeneous attenuation. The optic nerve is unremarkable. The left orbit is unremarkable. Specifically pre and post septal regions are unremarkable. Intra Conal and extra Conal fat unremarkable. Extraocular musculature, optic nerve and globe are unremarkable. The osseous structures demonstrate no evidence of fracture or dislocation. The visualized paranasal sinuses are patent. The ostiomeatal complex is unremarkable. Concha bullosa is appreciated within the superior turbinate on the right.  CERVICAL SPINE FINDINGS  There is no evidence of acute fracture or dislocation. There is straightening of the normal cervical lordosis. There is no evidence of canal stenosis. At the C4-5 and C5-6 levels there is disc space narrowing, endplate hypertrophic spurring, and endplate sclerosis. Corticated linearly oriented lucencies project along the proximal aspect of the posterior ring of C2 on the left and to a lesser extent on the right. These findings have corticated borders and do not appear to traverse the portions of the ring. Differential considerations are Mach lines versus prior fractures versus congenital areas of incomplete fusion. These findings do not have the appearance of an acute fracture. Best appreciated image 27  series 3. Subcentimeter lymph nodes are appreciated scattered within the visualized portions of the neck as well as  atherosclerotic calcifications in the carotid vessels. Mild left lateral tilt of the cervical spine is appreciated is appears be positional.  IMPRESSION: 1. Involutional changes without evidence of acute intracranial abnormalities 2. Right periorbital hematoma without further focal acute abnormalities within the orbits 3. Straightening of the normal cervical lordosis within the cervical spine may reflect muscle spasm, collar placement, or positioning. No acute osseous abnormalities identified. Degenerative disc disease changes are appreciated within the mid to lower cervical spine region. Facet arthropathy is also identified within the lower cervical spine.   Electronically Signed   By: Margaree Mackintosh M.D.   On: 12/29/2013 11:54   Ct Cervical Spine Wo Contrast  12/29/2013   CLINICAL DATA:  Pain status post trauma  EXAM: CT HEAD AND ORBITS WITHOUT AND WITH CONTRAST  TECHNIQUE: Contiguous axial images were obtained from the base of the skull through the vertex with and without contrast. Multidetector CT imaging of the orbits was performed using the standard protocol with and without intravenous contrast.  COMPARISON:  None.  FINDINGS: CT HEAD FINDINGS  There is no evidence of extra-axial fluid collections nor acute hemorrhage. Global atrophy. Basal ganglial calcifications. Mild areas of low attenuation within the subcortical, deep, and periventricular white matter regions. There is no evidence of mass effect. There is no evidence of a depressed skull fracture. Mucosal thickening is appreciated within the ethmoid air cells. Remaining visualized paranasal sinuses and mastoid air cells are patent. Small dystrophic calcification projects along the lateral periphery of the left cerebellar hemisphere may represent sequela of prior infection.  CT ORBITS FINDINGS  A periorbital hematoma is appreciated along the superior medial aspect of the right orbit. There is preseptal involvement without evidence of postseptal  extension. The intraconal and extraconal fat is homogeneous. The extraocular musculature is unremarkable symmetric would correlate with the left without evidence reflecting edema or infiltration. The globe demonstrates homogeneous attenuation. The optic nerve is unremarkable. The left orbit is unremarkable. Specifically pre and post septal regions are unremarkable. Intra Conal and extra Conal fat unremarkable. Extraocular musculature, optic nerve and globe are unremarkable. The osseous structures demonstrate no evidence of fracture or dislocation. The visualized paranasal sinuses are patent. The ostiomeatal complex is unremarkable. Concha bullosa is appreciated within the superior turbinate on the right.  CERVICAL SPINE FINDINGS  There is no evidence of acute fracture or dislocation. There is straightening of the normal cervical lordosis. There is no evidence of canal stenosis. At the C4-5 and C5-6 levels there is disc space narrowing, endplate hypertrophic spurring, and endplate sclerosis. Corticated linearly oriented lucencies project along the proximal aspect of the posterior ring of C2 on the left and to a lesser extent on the right. These findings have corticated borders and do not appear to traverse the portions of the ring. Differential considerations are Mach lines versus prior fractures versus congenital areas of incomplete fusion. These findings do not have the appearance of an acute fracture. Best appreciated image 27 series 3. Subcentimeter lymph nodes are appreciated scattered within the visualized portions of the neck as well as atherosclerotic calcifications in the carotid vessels. Mild left lateral tilt of the cervical spine is appreciated is appears be positional.  IMPRESSION: 1. Involutional changes without evidence of acute intracranial abnormalities 2. Right periorbital hematoma without further focal acute abnormalities within the orbits 3. Straightening of the normal cervical lordosis within the  cervical spine may reflect  muscle spasm, collar placement, or positioning. No acute osseous abnormalities identified. Degenerative disc disease changes are appreciated within the mid to lower cervical spine region. Facet arthropathy is also identified within the lower cervical spine.   Electronically Signed   By: Salome Holmes M.D.   On: 12/29/2013 11:54   Ct Orbitss W/o Cm  12/29/2013   CLINICAL DATA:  Pain status post trauma  EXAM: CT HEAD AND ORBITS WITHOUT AND WITH CONTRAST  TECHNIQUE: Contiguous axial images were obtained from the base of the skull through the vertex with and without contrast. Multidetector CT imaging of the orbits was performed using the standard protocol with and without intravenous contrast.  COMPARISON:  None.  FINDINGS: CT HEAD FINDINGS  There is no evidence of extra-axial fluid collections nor acute hemorrhage. Global atrophy. Basal ganglial calcifications. Mild areas of low attenuation within the subcortical, deep, and periventricular white matter regions. There is no evidence of mass effect. There is no evidence of a depressed skull fracture. Mucosal thickening is appreciated within the ethmoid air cells. Remaining visualized paranasal sinuses and mastoid air cells are patent. Small dystrophic calcification projects along the lateral periphery of the left cerebellar hemisphere may represent sequela of prior infection.  CT ORBITS FINDINGS  A periorbital hematoma is appreciated along the superior medial aspect of the right orbit. There is preseptal involvement without evidence of postseptal extension. The intraconal and extraconal fat is homogeneous. The extraocular musculature is unremarkable symmetric would correlate with the left without evidence reflecting edema or infiltration. The globe demonstrates homogeneous attenuation. The optic nerve is unremarkable. The left orbit is unremarkable. Specifically pre and post septal regions are unremarkable. Intra Conal and extra Conal fat  unremarkable. Extraocular musculature, optic nerve and globe are unremarkable. The osseous structures demonstrate no evidence of fracture or dislocation. The visualized paranasal sinuses are patent. The ostiomeatal complex is unremarkable. Concha bullosa is appreciated within the superior turbinate on the right.  CERVICAL SPINE FINDINGS  There is no evidence of acute fracture or dislocation. There is straightening of the normal cervical lordosis. There is no evidence of canal stenosis. At the C4-5 and C5-6 levels there is disc space narrowing, endplate hypertrophic spurring, and endplate sclerosis. Corticated linearly oriented lucencies project along the proximal aspect of the posterior ring of C2 on the left and to a lesser extent on the right. These findings have corticated borders and do not appear to traverse the portions of the ring. Differential considerations are Mach lines versus prior fractures versus congenital areas of incomplete fusion. These findings do not have the appearance of an acute fracture. Best appreciated image 27 series 3. Subcentimeter lymph nodes are appreciated scattered within the visualized portions of the neck as well as atherosclerotic calcifications in the carotid vessels. Mild left lateral tilt of the cervical spine is appreciated is appears be positional.  IMPRESSION: 1. Involutional changes without evidence of acute intracranial abnormalities 2. Right periorbital hematoma without further focal acute abnormalities within the orbits 3. Straightening of the normal cervical lordosis within the cervical spine may reflect muscle spasm, collar placement, or positioning. No acute osseous abnormalities identified. Degenerative disc disease changes are appreciated within the mid to lower cervical spine region. Facet arthropathy is also identified within the lower cervical spine.   Electronically Signed   By: Salome Holmes M.D.   On: 12/29/2013 11:54      MDM   1. Head injury   2.  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 He has mild, degenerative joint disease.  Nursing Notes Reviewed/ Care Coordinated Applicable Imaging Reviewed Interpretation of Laboratory Data incorporated into ED treatment  The patient appears reasonably screened and/or stabilized for discharge and I doubt any other medical condition or other [facility] requiring further screening, evaluation, or treatment in the ED at this time prior to discharge.  Plan: Home Medications- Tylenol; Home Treatments- rest; return here if the recommended treatment, does not improve the symptoms; Recommended follow up- PCP as needed     Richarda Blade, MD 12/29/13 1606

## 2013-12-29 NOTE — ED Notes (Signed)
Dr. Wentz at bedside. 

## 2013-12-29 NOTE — Discharge Instructions (Signed)
Use ice to the sore areas, 3 times a day for 2 days. Take Tylenol for pain. 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 See your doctor as needed for problems.  Contusion A contusion is a deep bruise. Contusions are the result of an injury that caused bleeding under the skin. The contusion may turn blue, purple, or yellow. Minor injuries will give you a painless contusion, but more severe contusions may stay painful and swollen for a few weeks.  CAUSES  A contusion is usually caused by a blow, trauma, or direct force to an area of the body. SYMPTOMS   Swelling and redness of the injured area.  Bruising of the injured area.  Tenderness and soreness of the injured area.  Pain. DIAGNOSIS  The diagnosis can be made by taking a history and physical exam. An X-ray, CT scan, or MRI may be needed to determine if there were any associated injuries, such as fractures. TREATMENT  Specific treatment will depend on what area of the body was injured. In general, the best treatment for a contusion is resting, icing, elevating, and applying cold compresses to the injured area. Over-the-counter medicines may also be recommended for pain control. Ask your caregiver what the best treatment is for your contusion. HOME CARE INSTRUCTIONS   Put ice on the injured area.  Put ice in a plastic bag.  Place a towel between your skin and the bag.  Leave the ice on for 15-20 minutes, 03-04 times a day.  Only take over-the-counter or prescription medicines for pain, discomfort, or fever as directed by your caregiver. Your caregiver may recommend avoiding anti-inflammatory medicines (aspirin, ibuprofen, and naproxen) for 48 hours because these medicines may increase bruising.  Rest the injured area.  If possible, elevate the injured area to reduce swelling. SEEK IMMEDIATE MEDICAL CARE IF:   You have increased bruising or swelling.  You have pain that is getting worse.  Your swelling or pain is not  relieved with medicines. MAKE SURE YOU:   Understand these instructions.  Will watch your condition.  Will get help right away if you are not doing well or get worse. Document Released: 08/18/2005 Document Revised: 01/31/2012 Document Reviewed: 09/13/2011 [facility] Patient Information 2014 [place], [place].  Head Injury, Adult You have received a head injury. It does not appear serious at this time. Headaches and vomiting are common following head injury. It should be easy to awaken from sleeping. Sometimes it is necessary for you to stay in the emergency department for a while for observation. Sometimes admission to the hospital may be needed. After injuries such as yours, most problems occur within the first 24 hours, but side effects may occur up to 7 10 days after the injury. It is important for you to carefully monitor your condition and contact your health care provider or seek immediate medical care if there is a change in your condition. WHAT ARE THE TYPES OF HEAD INJURIES? Head injuries can be as minor as a bump. Some head injuries can be more severe. More severe head injuries include:  A jarring injury to the brain (concussion).  A bruise of the brain (contusion). This mean there is bleeding in the brain that can cause swelling.  A cracked skull (skull fracture).  Bleeding in the brain that collects, clots, and forms a bump (hematoma). WHAT CAUSES A HEAD INJURY? A serious head injury is most likely to happen to someone who is in a car wreck and is not wearing a  seat belt. Other causes of major head injuries include bicycle or motorcycle accidents, sports injuries, and falls. HOW ARE HEAD INJURIES DIAGNOSED? A complete history of the event leading to the injury and your current symptoms will be helpful in diagnosing head injuries. Many times, pictures of the brain, such as CT or MRI are needed to see the extent of the injury. Often, an overnight hospital stay is necessary for  observation.  WHEN SHOULD I SEEK IMMEDIATE MEDICAL CARE?  You should get help right away if:  You have confusion or drowsiness.  You feel sick to your stomach (nauseous) or have continued, forceful vomiting.  You have dizziness or unsteadiness that is getting worse.  You have severe, continued headaches not relieved by medicine. Only take over-the-counter or prescription medicines for pain, fever, or discomfort as directed by your health care provider.  You do not have normal function of the arms or legs or are unable to walk.  You notice changes in the black spots in the center of the colored part of your eye (pupil).  You have a clear or bloody fluid coming from your nose or ears.  You have a loss of vision. During the next 24 hours after the injury, you must stay with someone who can watch you for the warning signs. This person should contact local emergency services (911 in the [place] if you have seizures, you become unconscious, or you are unable to wake up. HOW CAN I PREVENT A HEAD INJURY IN THE FUTURE? The most important factor for preventing major head injuries is avoiding motor vehicle accidents. To minimize the potential for damage to your head, it is crucial to wear seat belts while riding in motor vehicles. Wearing helmets while bike riding and playing collision sports (like football) is also helpful. Also, avoiding dangerous activities around the house will further help reduce your risk of head injury.  WHEN CAN I RETURN TO NORMAL ACTIVITIES AND ATHLETICS? You should be reevaluated by your health care provider before returning to these activities. If you have any of the following symptoms, you should not return to activities or contact sports until 1 week after the symptoms have stopped:  Persistent headache.  Dizziness or vertigo.  Poor attention and concentration.  Confusion.  Memory problems.  Nausea or vomiting.  Fatigue or tire  easily.  Irritability.  Intolerant of bright lights or loud noises.  Anxiety or depression.  Disturbed sleep. MAKE SURE YOU:   Understand these instructions.  Will watch your condition.  Will get help right away if you are not doing well or get worse. Document Released: 11/08/2005 Document Revised: 08/29/2013 Document Reviewed: 07/16/2013 [facility] Patient Information 2014 [place].

## 2013-12-29 NOTE — ED Notes (Signed)
Pt tripped on uneven sidewalk yesterday and hit his R forehead. He denies LOC but states hes been dizzy with a headache since. States his vision is blurry in R eye but he broke his glasses during the fall. He is A&Ox4 now, breathing easily, ambulatory on arrival

## 2013-12-29 NOTE — ED Notes (Signed)
Patient states he was walking on the sidewalk and tripped over a piece of concrete yesterday at approx 1030 am.  Patient c/o head pain, dizziness, blurred vision in the right eye, tenderness in upper/posterior neck.  Also c/o left wrist pain but full movement, no swelling, and right knee pain with minor swelling and tenderness, full movement.  Denies LOC, does not take any blood thinners.

## 2014-01-01 ENCOUNTER — Ambulatory Visit (INDEPENDENT_AMBULATORY_CARE_PROVIDER_SITE_OTHER): Payer: Medicare Other | Admitting: Cardiovascular Disease

## 2014-01-01 ENCOUNTER — Encounter: Payer: Self-pay | Admitting: Cardiovascular Disease

## 2014-01-01 VITALS — BP 142/72 | HR 55 | Ht 68.0 in | Wt 169.3 lb

## 2014-01-01 DIAGNOSIS — I440 Atrioventricular block, first degree: Secondary | ICD-10-CM

## 2014-01-01 DIAGNOSIS — K219 Gastro-esophageal reflux disease without esophagitis: Secondary | ICD-10-CM | POA: Insufficient documentation

## 2014-01-01 DIAGNOSIS — I2510 Atherosclerotic heart disease of native coronary artery without angina pectoris: Secondary | ICD-10-CM

## 2014-01-01 DIAGNOSIS — N183 Chronic kidney disease, stage 3 unspecified: Secondary | ICD-10-CM

## 2014-01-01 DIAGNOSIS — E785 Hyperlipidemia, unspecified: Secondary | ICD-10-CM | POA: Insufficient documentation

## 2014-01-01 NOTE — Patient Instructions (Signed)
Your physician has recommended you make the following change in your medication: decrease the amiodarone to every other day for 1 month then stop.  Your physician recommends that you schedule a follow-up appointment in: 6 months.

## 2014-01-01 NOTE — Progress Notes (Signed)
Patient ID: [name], male   DOB: [birth year], 73 y.o.   MRN: [id number]      HPI: [name] is a 73 y.o. male who presents for 6 month followup cardiology evaluation.  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 The December 2012 he presented with acute congestive heart failure requiring BiPAP therapy and during his hospitalization ejection fraction dropped to 20-25%. He ruled in for non-ST segment elevation MI and was not found to have high-grade obstructive CAD. He did wear life-vest transiently and with improvement of his ejection fraction to approximately 35-40% in April 2013 his life as was discontinued. He also has a history of intermittent second-degree AV block lead leading to discontinuance of his beta blocker therapy and reduction of his amiodarone dose. His  recent echo Doppler study in March 2014 revealed an ejection fraction that had increased to approximately 55%. 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  Over the past 6 months, [name] has continued to feel well. He denies any leg swelling and has been taking furosemide 20 mg in addition to Aldactone 25 mg twice a day. Is not having any anginal symptoms on his current dose of isosorbide. He aware of palpitations. He denies presyncope or syncope. He presents for followup evaluation.   Past Medical History  Diagnosis Date  . Colon polyp, hyperplastic   . Spondylolisthesis   . Hypertension   . CHF (congestive heart failure)   . Unstable angina 11/22/2011  . Respiratory failure, acute 11/22/2011  . CAD (coronary artery disease) 80% stenosis diag of the LAD, 30% in OM2 branch of LCX in 2009 11/22/2011  . Accelerated hypertension 11/22/2011  . Unstable angina 05/26/2012  . LV dysfunction, hx of EF 20% but most recent Echo 03/14/12 EF 35 -40%, life vest discontinued 05/26/2012  . Nonischemic cardiomyopathy   . Ventricular  ectopy Hx of PVCs 11/22/2011  . 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 Myocardial infarction 11/22/11  . Acute bronchitis 05/26/12  . Exertional dyspnea   . Legally blind     "both eyes"  . 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 CKD (chronic kidney disease) stage 3, GFR 30-59 ml/min 05/27/2012  . Acute on chronic systolic CHF (congestive heart failure) 06/05/2012  . History of stress test 06/01/2012    Normal myocardial perfusion study. compared to the previous study there is no significant change. this is a low risk scan    Past Surgical History  Procedure Laterality Date  . Cystoscopy    . Rotator cuff repair  2000's    left  . Back surgery    . Cardiac catheterization  11/2011  . Posterior fusion lumbar spine  1979  . Cataract extraction, bilateral  1990's  . Ep study and ablation of vt  7/13    PVC focus mapped to the right coronary cusp of the aorta, limited ablation performed due to proximity of the focus to the right coronary artery  . Eye surgery    . Cystoscopy with urethral dilatation N/A 05/04/2013    Procedure: CYSTOSCOPY WITH URETHRAL DILATATION;  Surgeon: Eustace Moore, MD;  Location: [place] [facility];  Service: Neurosurgery;  Laterality: N/A;  with insertion of foley catheter  . Cardiac catheterization  11/2011    didn't demonstrate high grade obstructive disease to account for his LV dysfunction.  No Known Allergies  Current Outpatient Prescriptions  Medication Sig Dispense Refill  . amiodarone (PACERONE) 200 MG tablet Take 100 mg by mouth every morning.      Marland Kitchen amLODipine (NORVASC) 10 MG tablet Take 10 mg by mouth every morning.      Marland Kitchen aspirin 325 MG tablet Take 325 mg by mouth every morning.       . bimatoprost (LUMIGAN) 0.01 % SOLN Place 1 drop into both eyes at bedtime.      . cycloSPORINE (RESTASIS) 0.05 % ophthalmic emulsion Place 1 drop into both eyes 2 (two) times daily.      . furosemide (LASIX) 20 MG tablet  Take 1 tablet (20 mg total) by mouth daily.  30 tablet  11  . isosorbide mononitrate (IMDUR) 30 MG 24 hr tablet Take 30 mg by mouth every morning.      . methocarbamol (ROBAXIN) 500 MG tablet Take 500 mg by mouth every 6 (six) hours as needed for muscle spasms.      . nitroGLYCERIN (NITROSTAT) 0.4 MG SL tablet Place 1 tablet (0.4 mg total) under the tongue every 5 (five) minutes as needed for chest pain.  25 tablet  4  . oxyCODONE 10 MG TABS Take 1 tablet (10 mg total) by mouth every 4 (four) hours as needed for pain.  90 tablet  0  . pantoprazole (PROTONIX) 40 MG tablet Take 40 mg by mouth every morning.       . pravastatin (PRAVACHOL) 40 MG tablet Take 40 mg by mouth every morning.       Marland Kitchen spironolactone (ALDACTONE) 25 MG tablet Take 12.5 mg by mouth 2 (two) times daily.      . traMADol (ULTRAM) 50 MG tablet Take 50 mg by mouth every 6 (six) hours as needed for moderate pain.        No current facility-administered medications for this visit.    History   Social History  . Marital Status: Legally Separated    Spouse Name: N/A    Number of Children: 3  . Years of Education: N/A   Occupational History  . Retired    Social History Main Topics  . Smoking status: Former Smoker -- 50 years    Types: Cigarettes    Quit date: 11/23/1999  . Smokeless tobacco: Never Used  . 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 Drug Use: No  . Sexual Activity: Yes   Other Topics Concern  . Not on file   Social History Narrative  . No narrative on file      ROS is negative for fevers, chills or night sweats. He denies change in weight. There are no visual changes or hearing difficulties. He is unaware lymphadenopathy. Denies palpitations. He denies presyncope or syncope. He denies wheezing cough or increased sputum production. He denies chest pressure. There is no nausea vomiting or diarrhea. There is no change in bowel or bladder habits.  He denies GU symptoms. He denies claudication. He denies myalgias. There is no significant edema. At times he does have intermittent back discomfort at times is one a back brace. There is no diabetes. He denies cold or heat intolerance. He does have hyperlipidemia. He takes pantoprazole for GERD which has been controlled. Other comprehensive 14 point system review is negative.  PE BP 142/72  Pulse 55  Ht 5\' 8"  (1.727 m)  Wt 169 lb 4.8 oz (76.794  kg)  BMI 25.75 kg/m2  General: Alert, oriented, no distress.  Skin: normal turgor, no rashes HEENT: Normocephalic, atraumatic. Pupils round and reactive; sclera anicteric;no lid lag.  Nose without nasal septal hypertrophy Mouth/Parynx benign; Mallinpatti scale 3 Chest wall: Nontender to palpation Neck: No JVD, no carotid bruits with normal carotid upstroke. Lungs: clear to ausculatation and percussion; no wheezing or rales Heart: RRR, s1 s2 normal 1/6 systolic murmur. No S3 or S4 gallop; no diastolic murmur. No rubs thrills or heaves Abdomen: soft, nontender; no hepatosplenomehaly, BS+; abdominal aorta nontender and not dilated by palpation. No CVA tenderness Pulses 2+ Extremities: no clubbing cyanosis or edema, Homan's sign negative  Neurologic: grossly nonfocal Psychological: Normal affect and mood  ECG (independently read by me): Sinus bradycardia with marked first-degree AV block with PR interval at 348 ms. QTc interval 457 ms. No significant ST-T changes.  Prior ECG from 06/27/2013: Normal sinus rhythm at 64 beats per minute with first-degree AV block. Voltaren 6 ms. QTC interval 464 ms.  LABS:  BMET    Component Value Date/Time   NA 142 05/04/2013 1431   K 5.0 05/04/2013 1431   CL 106 04/30/2013 0839   CO2 25 04/30/2013 0839   GLUCOSE 130* 05/04/2013 1431   BUN 18 04/30/2013 0839   CREATININE 1.83* 04/30/2013 0839   CALCIUM 9.4 04/30/2013 0839   GFRNONAA 35* 04/30/2013 0839   GFRAA 40* 04/30/2013 0839     Hepatic Function Panel       Component Value Date/Time   PROT 8.5* 03/19/2013 1749   ALBUMIN 4.1 03/19/2013 1749   AST 33 03/19/2013 1749   ALT 22 03/19/2013 1749   ALKPHOS 64 03/19/2013 1749   BILITOT 0.5 03/19/2013 1749   BILIDIR 0.1 03/19/2013 1749   IBILI 0.4 03/19/2013 1749     CBC    Component Value Date/Time   WBC 10.7* 05/05/2013 0356   RBC 3.86* 05/05/2013 0356   HGB 11.6* 05/05/2013 0356   HCT 33.0* 05/05/2013 0356   PLT 136* 05/05/2013 0356   MCV 85.5 05/05/2013 0356   MCH 30.1 05/05/2013 0356   MCHC 35.2 05/05/2013 0356   RDW 12.6 05/05/2013 0356   LYMPHSABS 1.8 04/30/2013 0839   MONOABS 0.5 04/30/2013 0839   EOSABS 0.1 04/30/2013 0839   BASOSABS 0.0 04/30/2013 0839     BNP    Component Value Date/Time   PROBNP 155.4* 03/19/2013 1749    Lipid Panel     Component Value Date/Time   CHOL 101 05/27/2012 0650   TRIG 52 05/27/2012 0650   HDL 51 05/27/2012 0650   CHOLHDL 2.0 05/27/2012 0650   VLDL 10 05/27/2012 0650   LDLCALC 40 05/27/2012 0650     RADIOLOGY: No results found.    ASSESSMENT AND PLAN:  [name] has a history of prior nonischemic cardiac myopathy with an ejection fraction in the past as well as 25%. His LV function has consistently improved on his current medical regimen and on last echo Doppler assessment was 55%. Presently, he is not having any signs of CHF or volume overload. His blood pressure is well controlled on amlodipine 10 mg furosemide 20 mg spironolactone 12.5 twice a day. He's not having any chest pain symptoms on his current dose of isosorbide mononitrate. He does have significant first-degree AV block which has been present previously. 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 I am now recommending we further reduce his amiodarone to 100 mg  every other day for one month and then he will discontinue this altogether. He is on Pravachol 40 mg for his hyperlipidemia and pantoprazole 40 mg for GERD and doing well. He tells me  his primary physician, Dr. Kennon Holter is recently checked laboratory. I will try to obtain these results and review the laboratory to make certain he is doing well from my standpoint. As long as he continues to remain stable, I will see him in 6 months for followup reevaluation.   Troy Sine, MD, [facility]  01/01/2014 10:04 AM

## 2014-02-12 ENCOUNTER — Emergency Department (HOSPITAL_COMMUNITY): Payer: Medicare Other

## 2014-02-12 ENCOUNTER — Emergency Department (HOSPITAL_COMMUNITY)
Admission: EM | Admit: 2014-02-12 | Discharge: 2014-02-12 | Disposition: A | Discharge status: Home / Self Care | Payer: Medicare Other | Attending: Emergency Medicine | Admitting: Emergency Medicine

## 2014-02-12 ENCOUNTER — Encounter (HOSPITAL_COMMUNITY): Payer: Self-pay | Admitting: Emergency Medicine

## 2014-02-12 DIAGNOSIS — Z79899 Other long term (current) drug therapy: Secondary | ICD-10-CM | POA: Insufficient documentation

## 2014-02-12 DIAGNOSIS — Z860100 Personal history of colon polyps, unspecified: Secondary | ICD-10-CM | POA: Insufficient documentation

## 2014-02-12 DIAGNOSIS — N183 Chronic kidney disease, stage 3 unspecified: Secondary | ICD-10-CM | POA: Insufficient documentation

## 2014-02-12 DIAGNOSIS — Q762 Congenital spondylolisthesis: Secondary | ICD-10-CM | POA: Insufficient documentation

## 2014-02-12 DIAGNOSIS — I129 Hypertensive chronic kidney disease with stage 1 through stage 4 chronic kidney disease, or unspecified chronic kidney disease: Secondary | ICD-10-CM | POA: Insufficient documentation

## 2014-02-12 DIAGNOSIS — M25519 Pain in unspecified shoulder: Secondary | ICD-10-CM | POA: Insufficient documentation

## 2014-02-12 DIAGNOSIS — M25511 Pain in right shoulder: Secondary | ICD-10-CM

## 2014-02-12 DIAGNOSIS — Z9889 Other specified postprocedural states: Secondary | ICD-10-CM | POA: Insufficient documentation

## 2014-02-12 DIAGNOSIS — Z7982 Long term (current) use of aspirin: Secondary | ICD-10-CM | POA: Insufficient documentation

## 2014-02-12 DIAGNOSIS — Z8709 Personal history of other diseases of the respiratory system: Secondary | ICD-10-CM | POA: Insufficient documentation

## 2014-02-12 DIAGNOSIS — Z87891 Personal history of nicotine dependence: Secondary | ICD-10-CM | POA: Insufficient documentation

## 2014-02-12 DIAGNOSIS — I2510 Atherosclerotic heart disease of native coronary artery without angina pectoris: Secondary | ICD-10-CM | POA: Insufficient documentation

## 2014-02-12 DIAGNOSIS — Z8669 Personal history of other diseases of the nervous system and sense organs: Secondary | ICD-10-CM | POA: Insufficient documentation

## 2014-02-12 DIAGNOSIS — I200 Unstable angina: Secondary | ICD-10-CM | POA: Insufficient documentation

## 2014-02-12 DIAGNOSIS — I252 Old myocardial infarction: Secondary | ICD-10-CM | POA: Insufficient documentation

## 2014-02-12 DIAGNOSIS — I5023 Acute on chronic systolic (congestive) heart failure: Secondary | ICD-10-CM | POA: Insufficient documentation

## 2014-02-12 MED ORDER — HYDROCODONE-ACETAMINOPHEN 5-325 MG PO TABS
2.0000 | ORAL_TABLET | Freq: Once | ORAL | Status: AC
  Administered 2014-02-12: 2 via ORAL
  Filled 2014-02-12: qty 2

## 2014-02-12 MED ORDER — HYDROCODONE-ACETAMINOPHEN 5-325 MG PO TABS: 1.0000 | ORAL_TABLET | ORAL | Status: DC | PRN

## 2014-02-12 NOTE — ED Notes (Signed)
Pt reports pain to entire right arm for over a month. Denies injury to arm. Pt reports having difficulty using his right arm due to pain and weakness, and  having difficulty gripping objects.

## 2014-02-12 NOTE — ED Provider Notes (Signed)
CSN: [id number]     Arrival date & time 02/12/14  1149 History   First MD Initiated Contact with Patient 02/12/14 1201     Chief Complaint  Patient presents with  . Arm Pain     (Consider location/radiation/quality/duration/timing/severity/associated sxs/prior Treatment) HPI Comments: Patient is a 73 year old male with a past medical history of CAD, cardiomyopathy, and CKD who presents with right arm pain for over 1 month. Symptoms started gradually and progressively worsened since the onset. The pain is aching and moderate with radiation down his right arm. The pain is made worse with movement at the shoulder joint. Patient reports seeing his PCP for the pain and was supposed to be referred to an Orthopedic doctor and was not. Patient reports his PCP has been sick and in the hospital so he has been unable to follow up. Patient reports taking tramadol at home for pain which provides mild relief. No alleviating factors. Patient denies injury.   Patient is a 73 y.o. male presenting with arm pain.  Arm Pain Associated symptoms include arthralgias. Pertinent negatives include no abdominal pain, chest pain, chills, fatigue, fever, nausea, neck pain, vomiting or weakness.    Past Medical History  Diagnosis Date  . Colon polyp, hyperplastic   . Spondylolisthesis   . Hypertension   . CHF (congestive heart failure)   . Unstable angina 11/22/2011  . Respiratory failure, acute 11/22/2011  . CAD (coronary artery disease) 80% stenosis diag of the LAD, 30% in OM2 branch of LCX in 2009 11/22/2011  . Accelerated hypertension 11/22/2011  . Unstable angina 05/26/2012  . LV dysfunction, hx of EF 20% but most recent Echo 03/14/12 EF 35 -40%, life vest discontinued 05/26/2012  . Nonischemic cardiomyopathy   . Ventricular ectopy Hx of PVCs 11/22/2011  . 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 Myocardial infarction 11/22/11  . Acute bronchitis 05/26/12  . Exertional  dyspnea   . Legally blind     "both eyes"  . 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 CKD (chronic kidney disease) stage 3, GFR 30-59 ml/min 05/27/2012  . Acute on chronic systolic CHF (congestive heart failure) 06/05/2012  . History of stress test 06/01/2012    Normal myocardial perfusion study. compared to the previous study there is no significant change. this is a low risk scan   Past Surgical History  Procedure Laterality Date  . Cystoscopy    . Rotator cuff repair  2000's    left  . Back surgery    . Cardiac catheterization  11/2011  . Posterior fusion lumbar spine  1979  . Cataract extraction, bilateral  1990's  . Ep study and ablation of vt  7/13    PVC focus mapped to the right coronary cusp of the aorta, limited ablation performed due to proximity of the focus to the right coronary artery  . Eye surgery    . Cystoscopy with urethral dilatation N/A 05/04/2013    Procedure: CYSTOSCOPY WITH URETHRAL DILATATION;  Surgeon: Eustace Moore, MD;  Location: [facility];  Service: Neurosurgery;  Laterality: N/A;  with insertion of foley catheter  . Cardiac catheterization  11/2011    didn't demonstrate high grade obstructive disease to account for his LV dysfunction.   History reviewed. No pertinent family history. History  Substance Use Topics  . Smoking status: Former Smoker -- 50 years    Types: Cigarettes    Quit date: 11/23/1999  .  Smokeless tobacco: Never Used  . 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  HENT: Negative for trouble swallowing.   Eyes: Negative for visual disturbance.  Respiratory: Negative for shortness of breath.   Cardiovascular: Negative for chest pain and palpitations.  Gastrointestinal: Negative for nausea, vomiting, abdominal pain and diarrhea.  Genitourinary: Negative for dysuria and difficulty urinating.   Musculoskeletal: Positive for arthralgias. Negative for neck pain.  Skin: Negative for color change.  Neurological: Negative for dizziness and weakness.  Psychiatric/Behavioral: Negative for dysphoric mood.      Allergies  Review of patient's allergies indicates no known allergies.  Home Medications   Current Outpatient Rx  Name  Route  Sig  Dispense  Refill  . amLODipine (NORVASC) 10 MG tablet   Oral   Take 10 mg by mouth every morning.         Marland Kitchen aspirin 325 MG tablet   Oral   Take 325 mg by mouth every morning.          . bimatoprost (LUMIGAN) 0.01 % SOLN   Both Eyes   Place 1 drop into both eyes at bedtime.         . cycloSPORINE (RESTASIS) 0.05 % ophthalmic emulsion   Both Eyes   Place 1 drop into both eyes 2 (two) times daily.         . furosemide (LASIX) 20 MG tablet   Oral   Take 1 tablet (20 mg total) by mouth daily.   30 tablet   11   . isosorbide mononitrate (IMDUR) 30 MG 24 hr tablet   Oral   Take 30 mg by mouth every morning.         . methocarbamol (ROBAXIN) 500 MG tablet   Oral   Take 500 mg by mouth every 6 (six) hours as needed for muscle spasms.         . nitroGLYCERIN (NITROSTAT) 0.4 MG SL tablet   Sublingual   Place 1 tablet (0.4 mg total) under the tongue every 5 (five) minutes as needed for chest pain.   25 tablet   4   . pantoprazole (PROTONIX) 40 MG tablet   Oral   Take 40 mg by mouth every morning.          . pravastatin (PRAVACHOL) 40 MG tablet   Oral   Take 40 mg by mouth every morning.          Marland Kitchen spironolactone (ALDACTONE) 25 MG tablet   Oral   Take 12.5 mg by mouth 2 (two) times daily.          BP 147/77  Pulse 62  Resp 17  Wt 168 lb (76.204 kg)  SpO2 99% Physical Exam  Nursing note and vitals reviewed. Constitutional: He is oriented to person, place, and time. He appears well-developed and well-nourished. No distress.  HENT:  Head: Normocephalic and atraumatic.  Eyes: Conjunctivae and EOM are  normal.  Neck: Normal range of motion.  Cardiovascular: Normal rate and regular rhythm.  Exam reveals no gallop and no friction rub.   No murmur heard. Pulmonary/Chest: Effort normal and breath sounds normal. He has no wheezes. He has no rales. He exhibits no tenderness.  Abdominal: Soft. He exhibits no distension. There is no tenderness. There is no rebound.  Musculoskeletal:  Right shoulder limited ROM due to  pain. No obvious deformity. Right shoulder strength limited due to pain. Right hand not tender to palpation and no obvious deformity.   Neurological: He is alert and oriented to person, place, and time. Coordination normal.  Speech is goal-oriented. Moves limbs without ataxia.   Skin: Skin is warm and dry.  Psychiatric: He has a normal mood and affect. His behavior is normal.    ED Course  Procedures (including critical care time) Labs Review Labs Reviewed - No data to display Imaging Review Dg Shoulder Right  02/12/2014   CLINICAL DATA:  Right shoulder pain, no known injury  EXAM: RIGHT SHOULDER - 2+ VIEW  COMPARISON:  None.  FINDINGS: Four views of the right shoulder submitted. No acute fracture or subluxation. Mild degenerative changes right AC joint.  IMPRESSION: No acute fracture or subluxation. Mild degenerative changes right AC joint.   Electronically Signed   By: Lahoma Crocker M.D.   On: 02/12/2014 13:17   Dg Hand Complete Right  02/12/2014   CLINICAL DATA:  Pain  EXAM: RIGHT HAND - COMPLETE 3+ VIEW  COMPARISON:  None.  FINDINGS: Three views of right hand submitted. No acute fracture or subluxation. Mild degenerative changes first carpometacarpal joint. Mild narrowing of radiocarpal joint space.  IMPRESSION: No acute fracture or subluxation.  Mild osteoarthritic changes.   Electronically Signed   By: Lahoma Crocker M.D.   On: 02/12/2014 13:18     EKG Interpretation None      MDM   Final diagnoses:  Right shoulder pain    12:39 PM Xray of right shoulder and right hand  pending. Patient will have Vicodin for pain. Patient's pain and symptoms are reproduced with movement. I doubt ischemic cardiac or neurologic etiology. Vitals stable and patient afebrile.   2:29 PM Patient's xrays show arthritic changes. Patient will be discharged with Vicodin and instructed to follow up with Dr. Percell Miller for further evaluation of his symptoms. No neurovascular compromise. Vitals stable and patient stable.   Alvina Chou, PA-C 02/12/14 1435

## 2014-02-12 NOTE — ED Notes (Signed)
Katelyn EDPA at bedside.

## 2014-02-12 NOTE — Discharge Instructions (Signed)
Take Vicodin as needed for pain. Follow up with Dr. Percell Miller for further evaluation and management. Refer to attached documents for more information.

## 2014-02-12 NOTE — ED Notes (Signed)
Pt presents with Right arm pain starting in his hand and radiates up to his Right shoulder, pt also c/o numbness and tingling to extremity x2 months. Pt seen his PCP 2 weeks ago. Pt able to extend Right arm laterally but with intense pain, pt unable to lift above his shoulder or reach behind his back. Pt has strong and equal grips bilaterally. Palpable pulse and sensation. Denies any new injury to extremity

## 2014-02-12 NOTE — ED Notes (Signed)
Pt discharged home with all belongings, pt alert and ambulatory upon discharge, 1 new rx prescribed, pt verbalizes understanding of discharge instructins

## 2014-02-12 NOTE — ED Notes (Signed)
Patient transported to X-ray 

## 2014-02-16 NOTE — ED Provider Notes (Signed)
Medical screening examination/treatment/procedure(s) were performed by non-physician practitioner and as supervising physician I was immediately available for consultation/collaboration.   EKG Interpretation None        Enora Trillo, MD 02/16/14 1101 

## 2014-05-12 ENCOUNTER — Emergency Department (HOSPITAL_COMMUNITY): Payer: Medicare Other

## 2014-05-12 ENCOUNTER — Encounter (HOSPITAL_COMMUNITY): Payer: Self-pay | Admitting: Emergency Medicine

## 2014-05-12 ENCOUNTER — Emergency Department (HOSPITAL_COMMUNITY)
Admission: EM | Admit: 2014-05-12 | Discharge: 2014-05-12 | Disposition: A | Discharge status: Home / Self Care | Payer: Medicare Other | Attending: Emergency Medicine | Admitting: Emergency Medicine

## 2014-05-12 DIAGNOSIS — M79609 Pain in unspecified limb: Secondary | ICD-10-CM | POA: Insufficient documentation

## 2014-05-12 DIAGNOSIS — I2510 Atherosclerotic heart disease of native coronary artery without angina pectoris: Secondary | ICD-10-CM | POA: Diagnosis not present

## 2014-05-12 DIAGNOSIS — Z79899 Other long term (current) drug therapy: Secondary | ICD-10-CM | POA: Insufficient documentation

## 2014-05-12 DIAGNOSIS — Z7982 Long term (current) use of aspirin: Secondary | ICD-10-CM | POA: Insufficient documentation

## 2014-05-12 DIAGNOSIS — I5023 Acute on chronic systolic (congestive) heart failure: Secondary | ICD-10-CM | POA: Diagnosis not present

## 2014-05-12 DIAGNOSIS — R51 Headache: Secondary | ICD-10-CM | POA: Insufficient documentation

## 2014-05-12 DIAGNOSIS — Z9889 Other specified postprocedural states: Secondary | ICD-10-CM | POA: Insufficient documentation

## 2014-05-12 DIAGNOSIS — M79604 Pain in right leg: Secondary | ICD-10-CM

## 2014-05-12 DIAGNOSIS — Z860100 Personal history of colon polyps, unspecified: Secondary | ICD-10-CM | POA: Insufficient documentation

## 2014-05-12 DIAGNOSIS — N183 Chronic kidney disease, stage 3 unspecified: Secondary | ICD-10-CM | POA: Insufficient documentation

## 2014-05-12 DIAGNOSIS — I200 Unstable angina: Secondary | ICD-10-CM | POA: Insufficient documentation

## 2014-05-12 DIAGNOSIS — I252 Old myocardial infarction: Secondary | ICD-10-CM | POA: Insufficient documentation

## 2014-05-12 DIAGNOSIS — Q762 Congenital spondylolisthesis: Secondary | ICD-10-CM | POA: Insufficient documentation

## 2014-05-12 DIAGNOSIS — I129 Hypertensive chronic kidney disease with stage 1 through stage 4 chronic kidney disease, or unspecified chronic kidney disease: Secondary | ICD-10-CM | POA: Diagnosis not present

## 2014-05-12 DIAGNOSIS — M7989 Other specified soft tissue disorders: Secondary | ICD-10-CM

## 2014-05-12 DIAGNOSIS — Z8709 Personal history of other diseases of the respiratory system: Secondary | ICD-10-CM | POA: Diagnosis not present

## 2014-05-12 DIAGNOSIS — Z87891 Personal history of nicotine dependence: Secondary | ICD-10-CM | POA: Diagnosis not present

## 2014-05-12 LAB — APTT: aPTT: 35 seconds (ref 24–37)

## 2014-05-12 LAB — CBC WITH DIFFERENTIAL/PLATELET
BASOS ABS: 0 10*3/uL (ref 0.0–0.1)
Basophils Relative: 0 % (ref 0–1)
EOS ABS: 0.1 10*3/uL (ref 0.0–0.7)
Eosinophils Relative: 2 % (ref 0–5)
HCT: 39.1 % (ref 39.0–52.0)
Hemoglobin: 13.4 g/dL (ref 13.0–17.0)
LYMPHS PCT: 33 % (ref 12–46)
Lymphs Abs: 1.8 10*3/uL (ref 0.7–4.0)
MCH: 30.5 pg (ref 26.0–34.0)
MCHC: 34.3 g/dL (ref 30.0–36.0)
MCV: 88.9 fL (ref 78.0–100.0)
Monocytes Absolute: 0.6 10*3/uL (ref 0.1–1.0)
Monocytes Relative: 10 % (ref 3–12)
Neutro Abs: 3 10*3/uL (ref 1.7–7.7)
Neutrophils Relative %: 55 % (ref 43–77)
PLATELETS: 139 10*3/uL — AB (ref 150–400)
RBC: 4.4 MIL/uL (ref 4.22–5.81)
RDW: 12.6 % (ref 11.5–15.5)
WBC: 5.5 10*3/uL (ref 4.0–10.5)

## 2014-05-12 LAB — I-STAT CHEM 8, ED
BUN: 15 mg/dL (ref 6–23)
CALCIUM ION: 1.25 mmol/L (ref 1.13–1.30)
Chloride: 106 mEq/L (ref 96–112)
Creatinine, Ser: 1.6 mg/dL — ABNORMAL HIGH (ref 0.50–1.35)
Glucose, Bld: 115 mg/dL — ABNORMAL HIGH (ref 70–99)
HCT: 42 % (ref 39.0–52.0)
Hemoglobin: 14.3 g/dL (ref 13.0–17.0)
Potassium: 3.6 mEq/L — ABNORMAL LOW (ref 3.7–5.3)
Sodium: 145 mEq/L (ref 137–147)
TCO2: 24 mmol/L (ref 0–100)

## 2014-05-12 LAB — PROTIME-INR
INR: 1.29 (ref 0.00–1.49)
PROTHROMBIN TIME: 15.8 s — AB (ref 11.6–15.2)

## 2014-05-12 MED ORDER — ACETAMINOPHEN 500 MG PO TABS: 500.0000 mg | ORAL_TABLET | Freq: Four times a day (QID) | ORAL | Status: DC | PRN

## 2014-05-12 MED ORDER — ACETAMINOPHEN 325 MG PO TABS
650.0000 mg | ORAL_TABLET | Freq: Once | ORAL | Status: AC
  Administered 2014-05-12: 650 mg via ORAL
  Filled 2014-05-12: qty 2

## 2014-05-12 NOTE — ED Notes (Signed)
Pt presents to department for evaluation of R foot swelling. States he was watching TV last night when R foot began tingling and swelling. Denies pain at the time. History of MI in 2013. Pt is alert and oriented x4. NAD.

## 2014-05-12 NOTE — Progress Notes (Signed)
VASCULAR LAB PRELIMINARY  PRELIMINARY  PRELIMINARY  PRELIMINARY  Right lower extremity venous Doppler completed.    Preliminary report:  There is no DVT or SVT noted in the right lower extremity.   KANADY, CANDACE, RVT 05/12/2014, 12:16 PM

## 2014-05-12 NOTE — ED Provider Notes (Addendum)
CSN: [id number]     Arrival date & time 05/12/14  0913 History   First MD Initiated Contact with Patient 05/12/14 1121     Chief Complaint  Patient presents with  . Foot Swelling     (Consider location/radiation/quality/duration/timing/severity/associated sxs/prior Treatment) HPI Comments: Pt states yesterday evening noticed swelling and pain in the right foot traveling up to the calf.  States last night had trouble walking on the leg due to pain and felt weak.  Also states noticed more pain in the right shoulder with possibly some hand weakness.  Denies CP, SOB, abd pain but today has had a mild HA.  Has not taken anything for the sx and no prior hx of similar.  Pt has extensive cardiac hx but has been stable since MI 2 years ago.  No tobacco use, or alcohol use.  No recent med changes. No travel, hospitalizations or immobilization recently.  The history is provided by the patient.    Past Medical History  Diagnosis Date  . Colon polyp, hyperplastic   . Spondylolisthesis   . Hypertension   . CHF (congestive heart failure)   . Unstable angina 11/22/2011  . Respiratory failure, acute 11/22/2011  . CAD (coronary artery disease) 80% stenosis diag of the LAD, 30% in OM2 branch of LCX in 2009 11/22/2011  . Accelerated hypertension 11/22/2011  . Unstable angina 05/26/2012  . LV dysfunction, hx of EF 20% but most recent Echo 03/14/12 EF 35 -40%, life vest discontinued 05/26/2012  . Nonischemic cardiomyopathy   . Ventricular ectopy Hx of PVCs 11/22/2011  . 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 Myocardial infarction 11/22/11  . Acute bronchitis 05/26/12  . Exertional dyspnea   . Legally blind     "both eyes"  . 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 CKD (chronic kidney disease) stage 3, GFR 30-59 ml/min 05/27/2012  . Acute on chronic systolic CHF (congestive heart failure) 06/05/2012  . History of stress test 06/01/2012    Normal myocardial  perfusion study. compared to the previous study there is no significant change. this is a low risk scan   Past Surgical History  Procedure Laterality Date  . Cystoscopy    . Rotator cuff repair  2000's    left  . Back surgery    . Cardiac catheterization  11/2011  . Posterior fusion lumbar spine  1979  . Cataract extraction, bilateral  1990's  . Ep study and ablation of vt  7/13    PVC focus mapped to the right coronary cusp of the aorta, limited ablation performed due to proximity of the focus to the right coronary artery  . Eye surgery    . Cystoscopy with urethral dilatation N/A 05/04/2013    Procedure: CYSTOSCOPY WITH URETHRAL DILATATION;  Surgeon: Eustace Moore, MD;  Location: [facility];  Service: Neurosurgery;  Laterality: N/A;  with insertion of foley catheter  . Cardiac catheterization  11/2011    didn't demonstrate high grade obstructive disease to account for his LV dysfunction.   History reviewed. No pertinent family history. History  Substance Use Topics  . Smoking status: Former Smoker -- 50 years    Types: Cigarettes    Quit date: 11/23/1999  . Smokeless tobacco: Never Used  . 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     Allergies  Review of patient's allergies indicates no known allergies.  Home Medications   Prior to Admission medications   Medication Sig Start Date End Date Taking? Authorizing Diaz Crago  amLODipine (NORVASC) 10 MG tablet Take 10 mg by mouth every morning.   Yes Historical Kase Shughart, MD  aspirin 325 MG tablet Take 325 mg by mouth every morning.    Yes Historical Mikyle Sox, MD  bimatoprost (LUMIGAN) 0.01 % SOLN Place 1 drop into both eyes at bedtime.   Yes Historical Maryela Tapper, MD  cycloSPORINE (RESTASIS) 0.05 % ophthalmic emulsion Place 1 drop into both eyes 2 (two) times daily.   Yes Historical Loranzo Desha, MD  furosemide  (LASIX) 20 MG tablet Take 1 tablet (20 mg total) by mouth daily. 11/27/11  Yes Mihai Croitoru, MD  HYDROcodone-acetaminophen (NORCO/VICODIN) 5-325 MG per tablet Take 1-2 tablets by mouth every 4 (four) hours as needed. 02/12/14  Yes Kaitlyn Szekalski, PA-C  isosorbide mononitrate (IMDUR) 30 MG 24 hr tablet Take 30 mg by mouth every morning.   Yes Historical Ahmon Tosi, MD  methocarbamol (ROBAXIN) 500 MG tablet Take 500 mg by mouth every 6 (six) hours as needed for muscle spasms.   Yes Historical Ayvion Kavanagh, MD  pravastatin (PRAVACHOL) 40 MG tablet Take 40 mg by mouth every morning.    Yes Historical Riven Mabile, MD  spironolactone (ALDACTONE) 25 MG tablet Take 12.5 mg by mouth 2 (two) times daily.   Yes Historical Maddilynn Esperanza, MD  nitroGLYCERIN (NITROSTAT) 0.4 MG SL tablet Place 1 tablet (0.4 mg total) under the tongue every 5 (five) minutes as needed for chest pain. 05/30/12   Cecilie Kicks, NP   BP 129/67  Pulse 57  Temp(Src) 98.8 F (37.1 C) (Oral)  Resp 16  Ht 6' (1.829 m)  Wt 165 lb (74.844 kg)  BMI 22.37 kg/m2  SpO2 97% Physical Exam  Nursing note and vitals reviewed. Constitutional: He is oriented to person, place, and time. He appears well-developed and well-nourished. No distress.  HENT:  Head: Normocephalic and atraumatic.  Mouth/Throat: Oropharynx is clear and moist.  Eyes: Conjunctivae and EOM are normal. Pupils are equal, round, and reactive to light.  Neck: Normal range of motion. Neck supple.  Cardiovascular: Normal rate, regular rhythm and intact distal pulses.   No murmur heard. Bounding peripheral pulses in all ext  Pulmonary/Chest: Effort normal and breath sounds normal. No respiratory distress. He has no wheezes. He has no rales.  Abdominal: Soft. He exhibits no distension. There is no tenderness. There is no rebound and no guarding.  Musculoskeletal: Normal range of motion. He exhibits no edema and no tenderness.  No appreciable swelling of the lower legs.  Mild right sided calf  tenderness.  No thigh tenderness and no erythema or warmth.  Tenderness over the right AC joint with mild dcreased rom.  Neurological: He is alert and oriented to person, place, and time.  5/5 hand grip strength bilaterally.  Lower ext strength 5/5 bilaterally.  Sensation intact  Skin: Skin is warm and dry. No rash noted. No erythema.  Psychiatric: He has a normal mood and affect. His behavior is normal.    ED Course  Procedures (including critical care time) Labs Review Labs Reviewed  CBC WITH DIFFERENTIAL - Abnormal; Notable for the following:    Platelets 139 (*)    All other components within normal limits  PROTIME-INR - Abnormal; Notable for the following:    Prothrombin Time 15.8 (*)    All other components  within normal limits  I-STAT CHEM 8, ED - Abnormal; Notable for the following:    Potassium 3.6 (*)    Creatinine, Ser 1.60 (*)    Glucose, Bld 115 (*)    All other components within normal limits  APTT    Imaging Review Dg Chest 2 View  05/12/2014   CLINICAL DATA:  Right leg swelling.  EXAM: CHEST  2 VIEW  COMPARISON:  Chest x-ray 03/19/2013.  FINDINGS: Lung volumes are normal. No consolidative airspace disease. No pleural effusions. No pneumothorax. No pulmonary nodule or mass noted. Pulmonary vasculature and the cardiomediastinal silhouette are within normal limits. Posterior rod and pedicle screw fixation device in the lower thoracic and upper lumbar spine incompletely visualized. Old compression fracture noted, likely at L1, with approximately 70% loss of anterior vertebral body height and 30% loss of posterior vertebral body height (unchanged). Interbody graft likely at the L2-L3 interspace.  IMPRESSION: 1. No radiographic evidence of acute cardiopulmonary disease. 2. Surgical changes in the spine, as above. Old compression fracture of L1 is unchanged.   Electronically Signed   By: Vinnie Langton M.D.   On: 05/12/2014 13:55   Ct Head Wo Contrast  05/12/2014   CLINICAL  DATA:  Right-sided weakness, lower extremity weakness and swelling, history hypertension, CHF, coronary artery disease, non ischemic cardiomyopathy, MI, arrhythmia  EXAM: CT HEAD WITHOUT CONTRAST  TECHNIQUE: Contiguous axial images were obtained from the base of the skull through the vertex without intravenous contrast.  COMPARISON:  12/29/2013  FINDINGS: Generalized atrophy.  Normal ventricular morphology.  No midline shift or mass effect.  Otherwise normal appearance of brain parenchyma.  No intracranial hemorrhage, mass lesion or evidence acute infarction.  No extra-axial fluid collections.  Bones and sinuses unremarkable.  IMPRESSION: No acute intracranial abnormalities.   Electronically Signed   By: Lavonia Dana M.D.   On: 05/12/2014 13:23     EKG Interpretation None      MDM   Final diagnoses:  Leg pain, diffuse, right    Pt here with atypical sx of right leg pain and swelling that started yesterday while sitting and states some pain in the arm as well.  Denies prior hx of pain.  No recent travel or surgeries.  Denies recent SOB or chest pain but states mild HA today.  Pt has calf pain on exam but no notable swelling of the leg.  Also mild pain over the shoulder with palpation.  No recent medication changes.  Extensive cardiac hx in the past but has been stable.  No risk for clot but concern for DVT vs atypical hx for stroke. Coags, CBC, chem 8 all wnl.  Duplex of the RLE neg for DVT.   CXR and head CT pending.  2:01 PM All labs and imaging neg. Improved after tylenol  Blanchie Dessert, MD 05/12/14 1402  Blanchie Dessert, MD 05/12/14 1406

## 2014-05-12 NOTE — ED Notes (Addendum)
Patient transported to Ultrasound 

## 2014-05-12 NOTE — ED Notes (Signed)
Patient returned from Ultrasound. 

## 2014-05-23 ENCOUNTER — Encounter (HOSPITAL_COMMUNITY): Payer: Self-pay | Admitting: Emergency Medicine

## 2014-05-23 ENCOUNTER — Emergency Department (HOSPITAL_COMMUNITY)
Admission: EM | Admit: 2014-05-23 | Discharge: 2014-05-23 | Disposition: A | Discharge status: Home / Self Care | Payer: Medicare Other | Attending: Emergency Medicine | Admitting: Emergency Medicine

## 2014-05-23 DIAGNOSIS — Z87891 Personal history of nicotine dependence: Secondary | ICD-10-CM | POA: Insufficient documentation

## 2014-05-23 DIAGNOSIS — H6120 Impacted cerumen, unspecified ear: Secondary | ICD-10-CM | POA: Diagnosis not present

## 2014-05-23 DIAGNOSIS — Z79899 Other long term (current) drug therapy: Secondary | ICD-10-CM | POA: Diagnosis not present

## 2014-05-23 DIAGNOSIS — Z7982 Long term (current) use of aspirin: Secondary | ICD-10-CM | POA: Insufficient documentation

## 2014-05-23 DIAGNOSIS — I200 Unstable angina: Secondary | ICD-10-CM | POA: Diagnosis not present

## 2014-05-23 DIAGNOSIS — I129 Hypertensive chronic kidney disease with stage 1 through stage 4 chronic kidney disease, or unspecified chronic kidney disease: Secondary | ICD-10-CM | POA: Diagnosis not present

## 2014-05-23 DIAGNOSIS — Z8709 Personal history of other diseases of the respiratory system: Secondary | ICD-10-CM | POA: Diagnosis not present

## 2014-05-23 DIAGNOSIS — N183 Chronic kidney disease, stage 3 unspecified: Secondary | ICD-10-CM | POA: Insufficient documentation

## 2014-05-23 DIAGNOSIS — Z9889 Other specified postprocedural states: Secondary | ICD-10-CM | POA: Insufficient documentation

## 2014-05-23 DIAGNOSIS — H548 Legal blindness, as defined in USA: Secondary | ICD-10-CM | POA: Insufficient documentation

## 2014-05-23 DIAGNOSIS — I509 Heart failure, unspecified: Secondary | ICD-10-CM | POA: Diagnosis not present

## 2014-05-23 DIAGNOSIS — H6121 Impacted cerumen, right ear: Secondary | ICD-10-CM

## 2014-05-23 DIAGNOSIS — I252 Old myocardial infarction: Secondary | ICD-10-CM | POA: Diagnosis not present

## 2014-05-23 DIAGNOSIS — H938X9 Other specified disorders of ear, unspecified ear: Secondary | ICD-10-CM | POA: Diagnosis present

## 2014-05-23 DIAGNOSIS — I2510 Atherosclerotic heart disease of native coronary artery without angina pectoris: Secondary | ICD-10-CM | POA: Insufficient documentation

## 2014-05-23 DIAGNOSIS — H9201 Otalgia, right ear: Secondary | ICD-10-CM

## 2014-05-23 DIAGNOSIS — H9209 Otalgia, unspecified ear: Secondary | ICD-10-CM | POA: Diagnosis not present

## 2014-05-23 DIAGNOSIS — I5023 Acute on chronic systolic (congestive) heart failure: Secondary | ICD-10-CM | POA: Insufficient documentation

## 2014-05-23 NOTE — ED Notes (Signed)
PA student completed ear wax removal on patient.

## 2014-05-23 NOTE — ED Provider Notes (Signed)
CSN: [id number]     Arrival date & time 05/23/14  1127 History  This chart was scribed for non-physician practitioner Dalia Heading working with Richarda Blade, MD by Donato Schultz, ED Scribe. This patient was seen in room TR09C/TR09C and the patient's care was started at 12:06 PM.     Chief Complaint  Patient presents with  . Ear Fullness   Patient is a 73 y.o. male presenting with plugged ear sensation. The history is provided by the patient. No language interpreter was used.  Ear Fullness   HPI Comments: [name] is a 73 y.o. male who presents to the Emergency Department complaining of decreased hearing in his right ear that started last night after he cleaned his ears with a q-tip.  He denies drainage from his right ear and dizziness as associated symptoms.   Past Medical History  Diagnosis Date  . Colon polyp, hyperplastic   . Spondylolisthesis   . Hypertension   . CHF (congestive heart failure)   . Unstable angina 11/22/2011  . Respiratory failure, acute 11/22/2011  . CAD (coronary artery disease) 80% stenosis diag of the LAD, 30% in OM2 branch of LCX in 2009 11/22/2011  . Accelerated hypertension 11/22/2011  . Unstable angina 05/26/2012  . LV dysfunction, hx of EF 20% but most recent Echo 03/14/12 EF 35 -40%, life vest discontinued 05/26/2012  . Nonischemic cardiomyopathy   . Ventricular ectopy Hx of PVCs 11/22/2011  . 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 Myocardial infarction 11/22/11  . Acute bronchitis 05/26/12  . Exertional dyspnea   . Legally blind     "both eyes"  . 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 CKD (chronic kidney disease) stage 3, GFR 30-59 ml/min 05/27/2012  . Acute on chronic systolic CHF (congestive heart failure) 06/05/2012  . History of stress test 06/01/2012    Normal myocardial perfusion study. compared to the previous study there is no significant change. this is a low risk scan   Past  Surgical History  Procedure Laterality Date  . Cystoscopy    . Rotator cuff repair  2000's    left  . Back surgery    . Cardiac catheterization  11/2011  . Posterior fusion lumbar spine  1979  . Cataract extraction, bilateral  1990's  . Ep study and ablation of vt  7/13    PVC focus mapped to the right coronary cusp of the aorta, limited ablation performed due to proximity of the focus to the right coronary artery  . Eye surgery    . Cystoscopy with urethral dilatation N/A 05/04/2013    Procedure: CYSTOSCOPY WITH URETHRAL DILATATION;  Surgeon: Eustace Moore, MD;  Location: [facility];  Service: Neurosurgery;  Laterality: N/A;  with insertion of foley catheter  . Cardiac catheterization  11/2011    didn't demonstrate high grade obstructive disease to account for his LV dysfunction.   No family history on file. History  Substance Use Topics  . Smoking status: Former Smoker -- 50 years    Types: Cigarettes    Quit date: 11/23/1999  . Smokeless tobacco: Never Used  . 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 Negative for ear discharge.   Neurological: Negative for dizziness.  All other systems reviewed and are negative.  Allergies  Review of patient's allergies indicates no known allergies.  Home Medications   Prior to Admission medications   Medication Sig Start Date End Date Taking? Authorizing Provider  acetaminophen (TYLENOL) 500 MG tablet Take 500 mg by mouth every 6 (six) hours as needed for headache (pain).   Yes Historical Provider, MD  amLODipine (NORVASC) 10 MG tablet Take 10 mg by mouth every morning.   Yes Historical Provider, MD  aspirin 325 MG tablet Take 325 mg by mouth every morning.    Yes Historical Provider, MD  bimatoprost (LUMIGAN) 0.01 % SOLN Place 1 drop into both eyes at bedtime.   Yes Historical Provider, MD  cycloSPORINE (RESTASIS) 0.05 %  ophthalmic emulsion Place 1 drop into both eyes 2 (two) times daily.   Yes Historical Provider, MD  furosemide (LASIX) 20 MG tablet Take 1 tablet (20 mg total) by mouth daily. 11/27/11  Yes Mihai Croitoru, MD  HYDROcodone-acetaminophen (NORCO/VICODIN) 5-325 MG per tablet Take 1 tablet by mouth every 4 (four) hours as needed (pain).   Yes Historical Provider, MD  isosorbide mononitrate (IMDUR) 30 MG 24 hr tablet Take 30 mg by mouth every morning.   Yes Historical Provider, MD  methocarbamol (ROBAXIN) 500 MG tablet Take 500 mg by mouth every 6 (six) hours as needed for muscle spasms.   Yes Historical Provider, MD  nitroGLYCERIN (NITROSTAT) 0.4 MG SL tablet Place 1 tablet (0.4 mg total) under the tongue every 5 (five) minutes as needed for chest pain. 05/30/12  Yes Cecilie Kicks, NP  pravastatin (PRAVACHOL) 40 MG tablet Take 40 mg by mouth every morning.    Yes Historical Provider, MD  spironolactone (ALDACTONE) 25 MG tablet Take 12.5 mg by mouth 2 (two) times daily.   Yes Historical Provider, MD   Triage Vitals: BP 144/65  Pulse 68  Temp(Src) 97.9 F (36.6 C) (Oral)  Resp 16  Ht 5\' 8"  (1.727 m)  Wt 165 lb 8 oz (75.07 kg)  BMI 25.17 kg/m2  SpO2 100%  Physical Exam  Nursing note and vitals reviewed. Constitutional: He is oriented to person, place, and time. He appears well-developed and well-nourished. No distress.  HENT:  Head: Normocephalic and atraumatic.  Cerumen impaction in the right ear canal.  Cardiovascular: Normal rate.   Pulmonary/Chest: Effort normal.  Neurological: He is alert and oriented to person, place, and time.  Skin: Skin is warm and dry. He is not diaphoretic.  Psychiatric: He has a normal mood and affect. His behavior is normal.    ED Course  EAR CERUMEN REMOVAL Date/Time: 05/29/2014 2:59 PM Performed by: Brent General Authorized by: Brent General Consent: Verbal consent obtained. written consent not obtained. Risks and benefits: risks, benefits and  alternatives were discussed Consent given by: patient Patient understanding: patient states understanding of the procedure being performed Patient consent: the patient's understanding of the procedure matches consent given Relevant documents: relevant documents present and verified Patient identity confirmed: verbally with patient Time out: Immediately prior to procedure a "time out" was called to verify the correct patient, procedure, equipment, support staff and site/side marked as required. Local anesthetic: none Location details: right ear Procedure type: irrigation Patient sedated: no Patient tolerance: Patient tolerated the procedure well with no immediate complications.   (including critical care time)  DIAGNOSTIC STUDIES: Oxygen Saturation is 100% on room air, normal by my interpretation.    COORDINATION OF CARE: 12:07 PM-Discussed a clinical suspicion of impacted cerumen and cleaning the patient's right ear out.  The  patient agreed to the treatment plan.   I personally performed the services described in this documentation, which was scribed in my presence. The recorded information has been reviewed and is accurate.   Brent General, PA-C 05/29/14 1459

## 2014-05-23 NOTE — Discharge Instructions (Signed)
Return here as needed. Follow up with your doctor. °

## 2014-05-23 NOTE — ED Notes (Addendum)
Pt reports he was cleaning his ears out last night with q-tip, and then "all of a sudden I couldn't hear out of my right ear." Pt denies pain or drainage. Denies dizziness. Pt ambulatory to triage.

## 2014-06-02 NOTE — ED Provider Notes (Signed)
Medical screening examination/treatment/procedure(s) were performed by non-physician practitioner and as supervising physician I was immediately available for consultation/collaboration.   EKG Interpretation None       Richarda Blade, MD 06/02/14 1425

## 2014-06-09 ENCOUNTER — Other Ambulatory Visit: Payer: Self-pay | Admitting: Cardiovascular Disease

## 2014-06-10 ENCOUNTER — Other Ambulatory Visit: Payer: Self-pay | Admitting: Cardiovascular Disease

## 2014-06-10 NOTE — Telephone Encounter (Signed)
Rx refill sent to patient pharmacy   

## 2014-07-04 ENCOUNTER — Ambulatory Visit (INDEPENDENT_AMBULATORY_CARE_PROVIDER_SITE_OTHER): Payer: Medicare Other | Admitting: Cardiovascular Disease

## 2014-07-04 ENCOUNTER — Ambulatory Visit: Payer: Medicare Other | Admitting: Cardiovascular Disease

## 2014-07-04 ENCOUNTER — Encounter: Payer: Self-pay | Admitting: Cardiovascular Disease

## 2014-07-04 VITALS — BP 124/76 | HR 61 | Ht 68.0 in | Wt 165.9 lb

## 2014-07-04 DIAGNOSIS — I2510 Atherosclerotic heart disease of native coronary artery without angina pectoris: Secondary | ICD-10-CM

## 2014-07-04 DIAGNOSIS — K219 Gastro-esophageal reflux disease without esophagitis: Secondary | ICD-10-CM

## 2014-07-04 DIAGNOSIS — I440 Atrioventricular block, first degree: Secondary | ICD-10-CM

## 2014-07-04 DIAGNOSIS — E785 Hyperlipidemia, unspecified: Secondary | ICD-10-CM

## 2014-07-04 NOTE — Patient Instructions (Signed)
Your physician recommends that you schedule a follow-up appointment in: 6 months. No changes were made today in your therapy. 

## 2014-07-04 NOTE — Progress Notes (Signed)
Patient ID: [name], male   DOB: May 03, 1941, 73 y.o.   MRN: [id number]      HPI: [name] is a 73 y.o. male who presents for 6 month followup cardiology evaluation.  [name]  has a remote history of a dilated cardiomyopathy with remote ejection fractions in the 35-45% range. In  December 2012 he presented with acute congestive heart failure requiring BiPAP therapy and during his hospitalization ejection fraction dropped to 20-25%. He ruled in for non-ST segment elevation MI and was not found to have high-grade obstructive CAD. He did wear life-vest transiently and with improvement of his ejection fraction to approximately 35-40% in April 2013 his life as was discontinued. He also has a history of intermittent second-degree AV block lead leading to discontinuance of his beta blocker therapy and reduction of his amiodarone dose. His  recent echo Doppler study in March 2014 revealed an ejection fraction that had increased to approximately 55%. 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  Last saw him 6 months ago, which time he was remaining fairly stable from a cardiovascular standpoint.  Over the past 6 months, [name] has continued to feel well. He denies any leg swelling and has been taking furosemide 20 mg in addition to Aldactone 25 mg twice a day. He deniesany anginal symptoms on his current dose of isosorbide.  He denies any PND or orthopnea.  I did review recent laboratory from one month ago.He aware of palpitations. He denies presyncope or syncope. He presents for followup evaluation.   Past Medical History  Diagnosis Date  . Colon polyp, hyperplastic   . Spondylolisthesis   . Hypertension   . CHF (congestive heart failure)   . Unstable angina 11/22/2011  . Respiratory failure, acute 11/22/2011  . CAD (coronary artery disease) 80% stenosis diag of the LAD, 30% in OM2 branch of LCX in 2009 11/22/2011  . Accelerated hypertension 11/22/2011  . Unstable angina 05/26/2012  . LV  dysfunction, hx of EF 20% but most recent Echo 03/14/12 EF 35 -40%, life vest discontinued 05/26/2012  . Nonischemic cardiomyopathy   . Ventricular ectopy Hx of PVCs 11/22/2011  . 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 Myocardial infarction 11/22/11  . Acute bronchitis 05/26/12  . Exertional dyspnea   . Legally blind     "both eyes"  . 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 CKD (chronic kidney disease) stage 3, GFR 30-59 ml/min 05/27/2012  . Acute on chronic systolic CHF (congestive heart failure) 06/05/2012  . History of stress test 06/01/2012    Normal myocardial perfusion study. compared to the previous study there is no significant change. this is a low risk scan    Past Surgical History  Procedure Laterality Date  . Cystoscopy    . Rotator cuff repair  2000's    left  . Back surgery    . Cardiac catheterization  11/2011  . Posterior fusion lumbar spine  1979  . Cataract extraction, bilateral  1990's  . Ep study and ablation of vt  7/13    PVC focus mapped to the right coronary cusp of the aorta, limited ablation performed due to proximity of the focus to the right coronary artery  . Eye surgery    . Cystoscopy with urethral dilatation N/A 05/04/2013    Procedure: CYSTOSCOPY WITH URETHRAL DILATATION;  Surgeon: Eustace Moore, MD;  Location: [place] [facility];  Service: Neurosurgery;  Laterality:  N/A;  with insertion of foley catheter  . Cardiac catheterization  11/2011    didn't demonstrate high grade obstructive disease to account for his LV dysfunction.    No Known Allergies  Current Outpatient Prescriptions  Medication Sig Dispense Refill  . amLODipine (NORVASC) 10 MG tablet Take 10 mg by mouth every morning.      Marland Kitchen aspirin 325 MG tablet Take 325 mg by mouth every morning.       . bimatoprost (LUMIGAN) 0.01 % SOLN Place 1 drop into both eyes at bedtime.      . cycloSPORINE (RESTASIS) 0.05 % ophthalmic emulsion Place 1 drop into both  eyes 2 (two) times daily.      . furosemide (LASIX) 20 MG tablet Take 1 tablet (20 mg total) by mouth daily.  30 tablet  11  . isosorbide mononitrate (IMDUR) 30 MG 24 hr tablet Take 30 mg by mouth every morning.      . nitroGLYCERIN (NITROSTAT) 0.4 MG SL tablet Place 1 tablet (0.4 mg total) under the tongue every 5 (five) minutes as needed for chest pain.  25 tablet  4  . potassium chloride SA (K-DUR,KLOR-CON) 20 MEQ tablet Take 20 mEq by mouth once.      . pravastatin (PRAVACHOL) 40 MG tablet Take 40 mg by mouth every morning.       Marland Kitchen spironolactone (ALDACTONE) 25 MG tablet TAKE 1/2 TABLET BY MOUTH TWICE DAILY  30 tablet  6   No current facility-administered medications for this visit.    History   Social History  . Marital Status: Legally Separated    Spouse Name: N/A    Number of Children: 3  . Years of Education: N/A   Occupational History  . Retired    Social History Main Topics  . Smoking status: Former Smoker -- 50 years    Types: Cigarettes    Quit date: 11/23/1999  . Smokeless tobacco: Never Used  . 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 Drug Use: No  . Sexual Activity: Yes   Other Topics Concern  . Not on file   Social History Narrative  . No narrative on file    Socially, he is divorced and has 4 children, and 7 grandchildren.  ROS General: Negative; No fevers, chills, or night sweats;  HEENT: Negative; No changes in vision or hearing, sinus congestion, difficulty swallowing Pulmonary: Negative; No cough, wheezing, shortness of breath, hemoptysis Cardiovascular: Negative; No chest pain, presyncope, syncope, palpitations GI: He has a history of GERD, which is controlled with pantoprazole No nausea, vomiting, diarrhea, or abdominal pain GU: Negative; No dysuria, hematuria, or difficulty voiding Musculoskeletal: Negative; no myalgias, joint pain, or weakness Hematologic/Oncology: Negative; no easy  bruising, bleeding Endocrine: Negative; no heat/cold intolerance; no diabetes Neuro: Negative; no changes in balance, headaches Skin: Negative; No rashes or skin lesions Psychiatric: Negative; No behavioral problems, depression Sleep: Negative; No snoring, daytime sleepiness, hypersomnolence, bruxism, restless legs, hypnogognic hallucinations, no cataplexy Other comprehensive 14 point system review is negative.   PE BP 124/76  Pulse 61  Ht 5\' 8"  (1.727 m)  Wt 165 lb 14.4 oz (75.252 kg)  BMI 25.23 kg/m2  General: Alert, oriented, no distress.  Skin: normal turgor, no rashes HEENT: Normocephalic, atraumatic. Pupils round and reactive; sclera anicteric;no lid lag.  Nose without nasal septal hypertrophy Mouth/Parynx benign; Mallinpatti scale 3 Chest wall: Nontender to palpation Neck: No JVD, no  carotid bruits with normal carotid upstroke. Lungs: clear to ausculatation and percussion; no wheezing or rales Heart: RRR, s1 s2 normal 1/6 systolic murmur. No S3 or S4 gallop; no diastolic murmur. No rubs thrills or heaves Abdomen: soft, nontender; no hepatosplenomehaly, BS+; abdominal aorta nontender and not dilated by palpation. No CVA tenderness Pulses 2+ Extremities: no clubbing cyanosis or edema, Homan's sign negative  Neurologic: grossly nonfocal Psychological: Normal affect and mood  ECG (independently read by me): Sinus rhythm at 61 beats per minute with a mild sinus arrhythmia and first-degree AV block.  Nonspecific ST change  Prior 01/01/2014 ECG (independently read by me): Sinus bradycardia with marked first-degree AV block with PR interval at 348 ms. QTc interval 457 ms. No significant ST-T changes.  Prior ECG from 06/27/2013: Normal sinus rhythm at 64 beats per minute with first-degree AV block. Voltaren 6 ms. QTC interval 464 ms.  LABS:  BMET    Component Value Date/Time   NA 145 05/12/2014 1210   K 3.6* 05/12/2014 1210   CL 106 05/12/2014 1210   CO2 25 04/30/2013 0839    GLUCOSE 115* 05/12/2014 1210   BUN 15 05/12/2014 1210   CREATININE 1.60* 05/12/2014 1210   CALCIUM 9.4 04/30/2013 0839   GFRNONAA 35* 04/30/2013 0839   GFRAA 40* 04/30/2013 0839     Hepatic Function Panel     Component Value Date/Time   PROT 8.5* 03/19/2013 1749   ALBUMIN 4.1 03/19/2013 1749   AST 33 03/19/2013 1749   ALT 22 03/19/2013 1749   ALKPHOS 64 03/19/2013 1749   BILITOT 0.5 03/19/2013 1749   BILIDIR 0.1 03/19/2013 1749   IBILI 0.4 03/19/2013 1749     CBC    Component Value Date/Time   WBC 5.5 05/12/2014 1200   RBC 4.40 05/12/2014 1200   HGB 14.3 05/12/2014 1210   HCT 42.0 05/12/2014 1210   PLT 139* 05/12/2014 1200   MCV 88.9 05/12/2014 1200   MCH 30.5 05/12/2014 1200   MCHC 34.3 05/12/2014 1200   RDW 12.6 05/12/2014 1200   LYMPHSABS 1.8 05/12/2014 1200   MONOABS 0.6 05/12/2014 1200   EOSABS 0.1 05/12/2014 1200   BASOSABS 0.0 05/12/2014 1200     BNP    Component Value Date/Time   PROBNP 155.4* 03/19/2013 1749    Lipid Panel     Component Value Date/Time   CHOL 101 05/27/2012 0650   TRIG 52 05/27/2012 0650   HDL 51 05/27/2012 0650   CHOLHDL 2.0 05/27/2012 0650   VLDL 10 05/27/2012 0650   LDLCALC 40 05/27/2012 0650     RADIOLOGY: No results found.    ASSESSMENT AND PLAN:  Mr. Buras has a history of anonischemic cardiomyopathy with an ejection fraction in the past as well as 25%. His LV function has normalized on current medical regimen and on last echo Doppler assessment was 55%. Presently, he is not having any signs of CHF or volume overload. His blood pressure is well controlled on amlodipine 10 mg furosemide 20 mg spironolactone 12.5 twice a day. He's not having any chest pain symptoms on his current dose of isosorbide mononitrate. He does have significant first-degree AV block which has been present previously.  When I last saw him, reduce his amiodarone to 100 mg every other day with ultimate discontinuance.  He denies any awareness of breakthrough arrhythmia off amiodarone  therapy.  He did have blood work recently obtained by Dr. Kennon Holter.  I reviewed these in detail.  Creatinine was 1.6, which was  improved from one year ago, when it was 1.8, and 2.1.  Hemoglobin and hematocrit are stable at 13.4 and 39.1.  Clinically, he is well compensated on his current medical regimen.  I would recommend you reduce his aspirin to 81 mg.  I will see him in 6 months for followup evaluation.   Troy Sine, MD, [facility]  07/04/2014 10:22 AM

## 2014-07-08 ENCOUNTER — Encounter: Payer: Self-pay | Admitting: Podiatry

## 2014-07-08 ENCOUNTER — Ambulatory Visit (INDEPENDENT_AMBULATORY_CARE_PROVIDER_SITE_OTHER): Payer: Medicare Other | Admitting: Podiatry

## 2014-07-08 VITALS — BP 122/75 | HR 79 | Resp 15 | Ht 68.0 in | Wt 155.0 lb

## 2014-07-08 DIAGNOSIS — I2510 Atherosclerotic heart disease of native coronary artery without angina pectoris: Secondary | ICD-10-CM

## 2014-07-08 DIAGNOSIS — R0989 Other specified symptoms and signs involving the circulatory and respiratory systems: Secondary | ICD-10-CM

## 2014-07-08 DIAGNOSIS — M79609 Pain in unspecified limb: Secondary | ICD-10-CM

## 2014-07-08 DIAGNOSIS — M79676 Pain in unspecified toe(s): Secondary | ICD-10-CM

## 2014-07-08 DIAGNOSIS — B351 Tinea unguium: Secondary | ICD-10-CM

## 2014-07-08 NOTE — Progress Notes (Signed)
   Subjective:    Patient ID: [name], male    DOB: Dec 18, 1940, 73 y.o.   MRN: [id number]  HPI Comments: N debridement L 10 toenails and B/L 5th toes corns D and O long-term C elongated painful toenails and B/L 5th toe corns and pain A difficulty trimming and pain in shoes T none except home pedicure     Review of Systems  Eyes: Positive for visual disturbance.  All other systems reviewed and are negative.      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

## 2014-07-08 NOTE — Patient Instructions (Signed)
Vascular lab will contact you to schedule a lower extremity arterial Doppler

## 2014-07-12 ENCOUNTER — Ambulatory Visit (HOSPITAL_COMMUNITY)
Admission: RE | Admit: 2014-07-12 | Discharge: 2014-07-12 | Disposition: A | Discharge status: Home / Self Care | Payer: Medicare Other | Source: Ambulatory Visit | Attending: Cardiovascular Disease | Admitting: Cardiovascular Disease

## 2014-07-12 DIAGNOSIS — R0989 Other specified symptoms and signs involving the circulatory and respiratory systems: Secondary | ICD-10-CM | POA: Diagnosis present

## 2014-07-12 DIAGNOSIS — I70209 Unspecified atherosclerosis of native arteries of extremities, unspecified extremity: Secondary | ICD-10-CM | POA: Diagnosis not present

## 2014-07-12 NOTE — Progress Notes (Signed)
Lower Extremity Arterial Duplex Completed. °Brianna L Mazza,RVT °

## 2014-07-18 ENCOUNTER — Telehealth: Payer: Self-pay | Admitting: *Deleted

## 2014-07-18 DIAGNOSIS — I739 Peripheral vascular disease, unspecified: Secondary | ICD-10-CM

## 2014-07-18 NOTE — Telephone Encounter (Signed)
I called and informed the patient that Dr. Amalia Hailey is referring him to Dr. Kennon Holter office for an abnormal doppler, posterior tibial disease.  I told him they should call him to schedule an appointment.  He said, All right.

## 2014-07-18 NOTE — Telephone Encounter (Signed)
Message copied by Lolita Rieger on Thu Jul 18, 2014 11:57 AM ------      Message from: Gean Birchwood      Created: Thu Jul 18, 2014 11:47 AM       Lower extremity arterial Doppler dated 07/15/2014            Normal ABIs and TBI is      Right posterior tibial occlusive disease            Contact Dr. Quay Burow and schedule patient for evaluation for right posterior tibial disease            Notify patient that arterial Dopplerconfirmed adequate flow, however, I am recommending evaluation of the right posterior tibial disease with Dr. Gwenlyn Found ------

## 2014-07-19 ENCOUNTER — Emergency Department (HOSPITAL_COMMUNITY)
Admission: EM | Admit: 2014-07-19 | Discharge: 2014-07-19 | Disposition: A | Discharge status: Home / Self Care | Payer: Medicare Other | Attending: Emergency Medicine | Admitting: Emergency Medicine

## 2014-07-19 ENCOUNTER — Encounter (HOSPITAL_COMMUNITY): Payer: Self-pay | Admitting: Emergency Medicine

## 2014-07-19 DIAGNOSIS — I252 Old myocardial infarction: Secondary | ICD-10-CM | POA: Insufficient documentation

## 2014-07-19 DIAGNOSIS — N183 Chronic kidney disease, stage 3 unspecified: Secondary | ICD-10-CM | POA: Insufficient documentation

## 2014-07-19 DIAGNOSIS — I499 Cardiac arrhythmia, unspecified: Secondary | ICD-10-CM | POA: Diagnosis not present

## 2014-07-19 DIAGNOSIS — Z860100 Personal history of colon polyps, unspecified: Secondary | ICD-10-CM | POA: Insufficient documentation

## 2014-07-19 DIAGNOSIS — I5023 Acute on chronic systolic (congestive) heart failure: Secondary | ICD-10-CM | POA: Insufficient documentation

## 2014-07-19 DIAGNOSIS — I129 Hypertensive chronic kidney disease with stage 1 through stage 4 chronic kidney disease, or unspecified chronic kidney disease: Secondary | ICD-10-CM | POA: Insufficient documentation

## 2014-07-19 DIAGNOSIS — Z8709 Personal history of other diseases of the respiratory system: Secondary | ICD-10-CM | POA: Diagnosis not present

## 2014-07-19 DIAGNOSIS — Z7982 Long term (current) use of aspirin: Secondary | ICD-10-CM | POA: Diagnosis not present

## 2014-07-19 DIAGNOSIS — I2510 Atherosclerotic heart disease of native coronary artery without angina pectoris: Secondary | ICD-10-CM | POA: Diagnosis not present

## 2014-07-19 DIAGNOSIS — Z79899 Other long term (current) drug therapy: Secondary | ICD-10-CM | POA: Diagnosis not present

## 2014-07-19 DIAGNOSIS — M79609 Pain in unspecified limb: Secondary | ICD-10-CM | POA: Insufficient documentation

## 2014-07-19 DIAGNOSIS — Z9889 Other specified postprocedural states: Secondary | ICD-10-CM | POA: Diagnosis not present

## 2014-07-19 DIAGNOSIS — I739 Peripheral vascular disease, unspecified: Secondary | ICD-10-CM | POA: Insufficient documentation

## 2014-07-19 DIAGNOSIS — Z87891 Personal history of nicotine dependence: Secondary | ICD-10-CM | POA: Diagnosis not present

## 2014-07-19 MED ORDER — HYDROCODONE-ACETAMINOPHEN 5-325 MG PO TABS
1.0000 | ORAL_TABLET | Freq: Once | ORAL | Status: AC
  Administered 2014-07-19: 1 via ORAL
  Filled 2014-07-19: qty 1

## 2014-07-19 MED ORDER — TRAMADOL HCL 50 MG PO TABS: 50.0000 mg | ORAL_TABLET | Freq: Four times a day (QID) | ORAL | Status: DC | PRN

## 2014-07-19 NOTE — ED Notes (Signed)
Patient transported to Ultrasound 

## 2014-07-19 NOTE — ED Provider Notes (Signed)
Pt accepted at sign out from Domenic Moras, [place].    73 year old male with a significant cardiac history, history of spondylolisthesis, presents for evaluation of left leg pain. 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 He was referred to Dr. Gwenlyn Found but has not seen him yet. We are awaiting results of DVT study and plan to followup on results. If DVT study negative patient is to be discharged home.  DVT study returns negative. We'll discharge patient and have him follow up with vascular surgery tomorrow.   Signed,  Dahlia Bailiff, PA-C 2:16 AM  This patient seen and discussed with Dr. Virgel Manifold, M.D.  Carrie Mew, PA-C 07/20/14 [phone]

## 2014-07-19 NOTE — Discharge Instructions (Signed)
Dr. Kennon Holter office will schedule a follow up appointment with you for further evaluation of your leg pain.    Intermittent Claudication Blockage of leg arteries results from poor circulation of blood in the leg arteries. This produces an aching, tired, and sometimes burning pain in the legs that is brought on by exercise and made better by rest. Claudication refers to the limping that happens from leg cramps. It is also referred to as Vaso-occlusive disease of the legs, arterial insufficiency of the legs, recurrent leg pain, recurrent leg cramping and calf pain with exercise.  CAUSES  This condition is due to narrowing or blockage of the arteries (muscular vessels which carry blood away from the heart and around the body). Blockage of arteries can occur anywhere in the body. If they occur in the heart, a person may experience angina (chest pain) or even a heart attack. If they occur in the neck or the brain, a person may have a stroke. Intermittent claudication is when the blockage occurs in the legs, most commonly in the calf or the foot.  Atherosclerosis, or blockage of arteries, can occur for many reasons. Some of these are smoking, diabetes, and high cholesterol. SYMPTOMS  Intermittent claudication may occur in both legs, and it often continues to get worse over time. However, some people complain only of weakness in the legs when walking, or a feeling of "tiredness" in the buttocks. Impotence (not able to have an erection) is an occasional complaint in men. Pain while resting is uncommon.  WHAT TO EXPECT AT [facility] PROVIDER'S OFFICE: Your medical history will be asked for and a physical examination will be performed. Medical history questions documenting claudication in detail may include:   Time pattern  Do you have leg cramps at night (nocturnal cramps)?  How often does leg pain with cramping occur?  Is it getting worse?  What is the quality of the pain?  Is the pain  sharp?  Is there an aching pain with the cramps?  Aggravating factors  Is it worse after you exercise?  Is it worse after you are standing for a while?  Do you smoke? How much?  Do you drink alcohol? How much?  Are you diabetic? How well is your blood sugar controlled?  Other  What other symptoms are also present?  Has there been impotence (men)?  Is there pain in the back?  Is there a darkening of the skin of the legs, feet or toes?  Is there weakness or paralysis of the legs? The physical examination may include evaluation of the femoral pulse (in the groin) and the other areas where the pulse can be felt in the legs. DIAGNOSIS  Diagnostic tests that may be performed include:  Blood pressure measured in arms and legs for comparison.  Doppler ultrasonography on the legs and the heart.  Duplex Doppler/ultrasound exam of extremity to visualize arterial blood flow.  ECG- to evaluate the activity of your heart.  Aortography- to visualize blockages in your arteries. TREATMENT Surgical treatment may be suggested if claudication interferes with the patient's activities or work, and if the diseased arteries do not seem to be improving after treatment. Be aware that this condition can worsen over time and you should carefully monitor your condition. HOME CARE INSTRUCTIONS  Talk to your caregiver about the cause of your leg cramping and about what to do at home to relieve it.  A healthy diet is important to lessen the likeliness of atherosclerosis.  A program  of daily walking for short periods, and stopping for pain or cramping, may help improve function.  It is important to stop smoking.  Avoid putting hot or cold items on legs.  Avoid tight shoes. SEEK MEDICAL CARE IF: There are many other causes of leg pain such as arthritis or low blood potassium. However, some causes of leg pain may be life threatening such as a blood clot in the legs. Seek medical attention if you  have:  Leg pain that does not go away.  Legs that may be red, hot or swollen.  Ulcers or sores appear on your ankle or foot.  Any chest pain or shortness of breath accompanying leg pain.  Diabetes.  You are pregnant. SEEK IMMEDIATE MEDICAL CARE IF:   Your leg pain becomes severe or will not go away.  Your foot turns blue or a dark color.  Your leg becomes red, hot or swollen or you develop a fever over 102F.  Any chest pain or shortness of breath accompanying leg pain. MAKE SURE YOU:   Understand these instructions.  Will watch your condition.  Will get help right away if you are not doing well or get worse. Document Released: 09/10/2004 Document Revised: 01/31/2012 Document Reviewed: 02/14/2014 [facility] Patient Information 2015 [place], [place]. This information is not intended to replace advice given to you by your health care provider. Make sure you discuss any questions you have with your health care provider.

## 2014-07-19 NOTE — Progress Notes (Signed)
*  PRELIMINARY RESULTS* Vascular Ultrasound Left lower extremity venous duplex has been completed.  Preliminary findings: no evidence of DVT.  Left AT and PT arteries are patent with adequate flow.   Landry Mellow, RDMS, RVT  07/19/2014, 4:54 PM

## 2014-07-19 NOTE — ED Notes (Signed)
Patient returned from Ultrasound. 

## 2014-07-19 NOTE — ED Notes (Signed)
Patient has a history of peripheral vascular disease. He had a vascular study done last week for right leg pain which showed disease in his posterior tibial artery. He reports today he started having pain in his left leg. He states it hurts from his foot all the way up his leg. He states he gets pain when he tries to exercise. Patient has been referred to Dr. Gwenlyn Found but he has not seen him yet..  On exam of patient's lower extremities there's no obvious swelling seen. His legs are warm and to the level of about the ankle and in his feet are a little bit cooler bilaterally. He has mild delay in capillary refill. He has easily palpable dorsalis pedis pulse. He has tenderness in his calf without cords. His toes are normal color.  Medical screening examination/treatment/procedure(s) were conducted as a shared visit with non-physician practitioner(s) and myself.  I personally evaluated the patient during the encounter.   EKG Interpretation None       Rolland Porter, MD, Abram Sander   Janice Norrie, MD 07/19/14 [phone]

## 2014-07-19 NOTE — ED Notes (Signed)
Pt reports going to dr for pain to right foot and leg, was sent for vascular study and then referred to see dr berry for diagnosis of posterior tibeal disease. Pt woke up this am and now having same pain to left leg, goes from ankle all the way up to his hip.

## 2014-07-19 NOTE — ED Provider Notes (Signed)
CSN: [id number]     Arrival date & time 07/19/14  1224 History   First MD Initiated Contact with Patient 07/19/14 1429     Chief Complaint  Patient presents with  . Leg Pain     (Consider location/radiation/quality/duration/timing/severity/associated sxs/prior Treatment) HPI  73 year old male with a significant cardiac history, history of spondylolisthesis, presents for evaluation of left leg pain. patient states for the past 2 weeks he has been experiencing right leg pain. Pain is described as a sharp and aching sensation which started in the right foot but radiates all the way up to his right hip. Pain is been persistent, worsening with ambulation and improved with rest. 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  He was referred to Dr. Gwenlyn Found but has not seen him yet.  TOday he developed similar pain to his L left leg which started at his foot and radiates to L hip.  Increasing pain with ambulation.  No fever, cp, sob, hemoptysis, leg swelling, numbness, weakness or rash.  No recent trauma.  He decided to come to ER for further evaluation.     Past Medical History  Diagnosis Date  . Colon polyp, hyperplastic   . Spondylolisthesis   . Hypertension   . CHF (congestive heart failure)   . Unstable angina 11/22/2011  . Respiratory failure, acute 11/22/2011  . CAD (coronary artery disease) 80% stenosis diag of the LAD, 30% in OM2 branch of LCX in 2009 11/22/2011  . Accelerated hypertension 11/22/2011  . Unstable angina 05/26/2012  . LV dysfunction, hx of EF 20% but most recent Echo 03/14/12 EF 35 -40%, life vest discontinued 05/26/2012  . Nonischemic cardiomyopathy   . Ventricular ectopy Hx of PVCs 11/22/2011  . 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 Myocardial infarction 11/22/11  . Acute bronchitis 05/26/12  . Exertional dyspnea   . Legally blind     "both eyes"  . 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 CKD (chronic kidney disease) stage 3, GFR 30-59 ml/min 05/27/2012  . Acute on chronic systolic CHF (congestive heart failure) 06/05/2012  . History of stress test 06/01/2012    Normal myocardial perfusion study. compared to the previous study there is no significant change. this is a low risk scan   Past Surgical History  Procedure Laterality Date  . Cystoscopy    . Rotator cuff repair  2000's    left  . Back surgery    . Cardiac catheterization  11/2011  . Posterior fusion lumbar spine  1979  . Cataract extraction, bilateral  1990's  . Ep study and ablation of vt  7/13    PVC focus mapped to the right coronary cusp of the aorta, limited ablation performed due to proximity of the focus to the right coronary artery  . Eye surgery    . Cystoscopy with urethral dilatation N/A 05/04/2013    Procedure: CYSTOSCOPY WITH URETHRAL DILATATION;  Surgeon: Eustace Moore, MD;  Location: [facility];  Service: Neurosurgery;  Laterality: N/A;  with insertion of foley catheter  . Cardiac catheterization  11/2011    didn't demonstrate high grade obstructive disease to account for his LV dysfunction.   History reviewed. No pertinent family history. History  Substance Use Topics  . Smoking status: Former Smoker -- 50 years    Types: Cigarettes    Quit date: 11/23/1999  .  Smokeless tobacco: Never Used  . 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     Allergies  Review of patient's allergies indicates no known allergies.  Home Medications   Prior to Admission medications   Medication Sig Start Date End Date Taking? Authorizing Provider  amLODipine (NORVASC) 10 MG tablet Take 10 mg by mouth every morning.   Yes Historical Provider, MD  aspirin 325 MG tablet Take 325 mg by mouth every morning.    Yes Historical Provider, MD  bimatoprost  (LUMIGAN) 0.01 % SOLN Place 1 drop into both eyes at bedtime.   Yes Historical Provider, MD  cycloSPORINE (RESTASIS) 0.05 % ophthalmic emulsion Place 1 drop into both eyes 2 (two) times daily.   Yes Historical Provider, MD  furosemide (LASIX) 20 MG tablet Take 1 tablet (20 mg total) by mouth daily. 11/27/11  Yes Mihai Croitoru, MD  isosorbide mononitrate (IMDUR) 30 MG 24 hr tablet Take 30 mg by mouth every morning.   Yes Historical Provider, MD  nitroGLYCERIN (NITROSTAT) 0.4 MG SL tablet Place 1 tablet (0.4 mg total) under the tongue every 5 (five) minutes as needed for chest pain. 05/30/12  Yes Cecilie Kicks, NP  potassium chloride SA (K-DUR,KLOR-CON) 20 MEQ tablet Take 20 mEq by mouth daily.    Yes Historical Provider, MD  pravastatin (PRAVACHOL) 40 MG tablet Take 40 mg by mouth every morning.    Yes Historical Provider, MD  spironolactone (ALDACTONE) 25 MG tablet Take 25 mg by mouth daily.   Yes Historical Provider, MD   BP 151/66  Pulse 60  Temp(Src) 97.6 F (36.4 C) (Oral)  Resp 15  SpO2 99% Physical Exam  Constitutional: He appears well-developed and well-nourished. No distress.  HENT:  Head: Atraumatic.  Eyes: Conjunctivae are normal.  Neck: Normal range of motion. Neck supple.  Cardiovascular:  Irregular heart rate  Pulmonary/Chest: Effort normal and breath sounds normal. He has no wheezes. He has no rales. He exhibits no tenderness.  Abdominal: Soft. There is no tenderness.  Musculoskeletal:  BLE: bilateral lower legs are warm to the touch with distal legs mildly cooler to the touch.  Diffused tenderness throughout L foot/ankle/calf without palpable cords, erythema, or edema.  Dorsalis pedis pulses palpable, mildlly delay cap refills to toes.   Patella DTR 2+ bilat.  No pitting edema.    Neurological: He is alert.  Skin: No rash noted.  Psychiatric: He has a normal mood and affect.    ED Course  Procedures (including critical care time)  3:34 PM Pt with posterior tibial  occlusion on recent ABI a week ago, here with new Left leg pain similar to R leg pain.  No obvious evidence of DVT, but will obtain vascular study for further evaluation.  I will also consult vascular surgeon, Dr. Gwenlyn Found for further care.  Care discussed with Dr. Tomi Bamberger.   3:41 PM I have consult with cardiology, and spoke with Trish.  She informed me that Dr. Gwenlyn Found is on vacation  4:24 PM i have consulted with cardiologist, Dr. Burt Knack, who felt that pt can be seen outpt with Dr. Gwenlyn Found for further evaluation.  The office will call pt to book appointment.  Plan of care was discussed with oncoming provider who will d/c pt pending vascular US result to r/o DVT.    Labs Review Labs Reviewed - No data  to display  Imaging Review No results found.   EKG Interpretation None      MDM   Final diagnoses:  Claudication in peripheral vascular disease    BP 149/76  Pulse 56  Temp(Src) 97.6 F (36.4 C) (Oral)  Resp 14  SpO2 100%     Domenic Moras, PA-C 07/23/14 0051

## 2014-07-21 ENCOUNTER — Other Ambulatory Visit: Payer: Self-pay | Admitting: Cardiovascular Disease

## 2014-07-22 NOTE — Telephone Encounter (Signed)
Rx was sent to pharmacy electronically. 

## 2014-07-23 NOTE — ED Provider Notes (Signed)
See prior note   Janice Norrie, MD 07/23/14 [phone]

## 2014-07-23 NOTE — ED Provider Notes (Signed)
Medical screening examination/treatment/procedure(s) were performed by non-physician practitioner and as supervising physician I was immediately available for consultation/collaboration.   EKG Interpretation None       Virgel Manifold, MD 07/23/14 1517

## 2014-07-25 ENCOUNTER — Telehealth: Payer: Self-pay | Admitting: Cardiovascular Disease

## 2014-07-25 NOTE — Telephone Encounter (Signed)
Closed encounter °

## 2014-08-01 ENCOUNTER — Other Ambulatory Visit: Payer: Self-pay | Admitting: Cardiovascular Disease

## 2014-08-01 NOTE — Telephone Encounter (Signed)
Rx refill sent to patient pharmacy   

## 2014-08-15 ENCOUNTER — Encounter: Payer: Self-pay | Admitting: Cardiovascular Disease

## 2014-08-15 ENCOUNTER — Ambulatory Visit (INDEPENDENT_AMBULATORY_CARE_PROVIDER_SITE_OTHER): Payer: Medicare Other | Admitting: Cardiovascular Disease

## 2014-08-15 VITALS — BP 150/76 | HR 54 | Ht 68.0 in | Wt 169.1 lb

## 2014-08-15 DIAGNOSIS — I739 Peripheral vascular disease, unspecified: Secondary | ICD-10-CM

## 2014-08-15 DIAGNOSIS — I2510 Atherosclerotic heart disease of native coronary artery without angina pectoris: Secondary | ICD-10-CM

## 2014-08-15 NOTE — Progress Notes (Signed)
08/15/2014 [name]   1941/04/20  [id number]  Primary Physician Elizabeth Palau, MD Primary Cardiologist: Lorretta Harp MD Renae Gloss   HPI:  [name] is a bright pleasant 73 year old mildly overweight separated African American male father of 2 living children (2 daughters have died) who was referred to me by Dr. Kendell Bane , podiatrist, for peripheral vascular evaluation. His cardiologist is Dr. Ellouise Newer and his primary care physician Dr. Lindwood Qua. His cardiovascular risk factor profile is positive for 25 pack years of tobacco abuse having quit 14 years ago, treated hypertension and hyperlipidemia. He does have mild CAD by cath in 2009 but predominantly nonischemic cardiomyopathy. He had Dopplers performed office on 07/12/14 that revealed an occluded right posterior tibial with a right ABI of 0.99 and a left of 1.2. He does complain of some bilateral calf discomfort when walking up stairs but this also occurs at rest. There are no open wounds to suggest critical limb ischemia   Current Outpatient Prescriptions  Medication Sig Dispense Refill  . amLODipine (NORVASC) 10 MG tablet TAKE 1 TABLET BY MOUTH EVERY DAY  30 tablet  11  . aspirin 325 MG tablet Take 325 mg by mouth every morning.       . bimatoprost (LUMIGAN) 0.01 % SOLN Place 1 drop into both eyes at bedtime.      . cycloSPORINE (RESTASIS) 0.05 % ophthalmic emulsion Place 1 drop into both eyes 2 (two) times daily.      . furosemide (LASIX) 20 MG tablet Take 1 tablet (20 mg total) by mouth daily.  30 tablet  11  . isosorbide mononitrate (IMDUR) 30 MG 24 hr tablet Take 30 mg by mouth every morning.      . nitroGLYCERIN (NITROSTAT) 0.4 MG SL tablet Place 1 tablet (0.4 mg total) under the tongue every 5 (five) minutes as needed for chest pain.  25 tablet  4  . potassium chloride SA (K-DUR,KLOR-CON) 20 MEQ tablet Take 20 mEq by mouth daily.       . pravastatin (PRAVACHOL) 40 MG tablet Take 40 mg by  mouth every morning.       Marland Kitchen spironolactone (ALDACTONE) 25 MG tablet Take 25 mg by mouth daily.      Marland Kitchen spironolactone (ALDACTONE) 25 MG tablet TAKE 1/2 TABLET BY MOUTH TWICE DAILY  30 tablet  5  . traMADol (ULTRAM) 50 MG tablet Take 1 tablet (50 mg total) by mouth every 6 (six) hours as needed.  15 tablet  0   No current facility-administered medications for this visit.    No Known Allergies  History   Social History  . Marital Status: Legally Separated    Spouse Name: N/A    Number of Children: 3  . Years of Education: N/A   Occupational History  . Retired    Social History Main Topics  . Smoking status: Former Smoker -- 50 years    Types: Cigarettes    Quit date: 11/23/1999  . Smokeless tobacco: Never Used  . 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 Drug Use: No  . Sexual Activity: Yes   Other Topics Concern  . Not on file   Social History Narrative  . No narrative on file     Review of Systems: General: negative for chills, fever, night sweats or weight changes.  Cardiovascular: negative for chest pain, dyspnea on exertion, edema, orthopnea,  palpitations, paroxysmal nocturnal dyspnea or shortness of breath Dermatological: negative for rash Respiratory: negative for cough or wheezing Urologic: negative for hematuria Abdominal: negative for nausea, vomiting, diarrhea, bright red blood per rectum, melena, or hematemesis Neurologic: negative for visual changes, syncope, or dizziness All other systems reviewed and are otherwise negative except as noted above.    Blood pressure 150/76, pulse 54, height 5\' 8"  (1.727 m), weight 169 lb 1.6 oz (76.703 kg).  General appearance: alert and no distress Neck: no adenopathy, no carotid bruit, no JVD, supple, symmetrical, trachea midline and thyroid not enlarged, symmetric, no tenderness/mass/nodules Lungs: clear to auscultation bilaterally Heart: regular rate and  rhythm, S1, S2 normal, no murmur, click, rub or gallop Extremities: extremities normal, atraumatic, no cyanosis or edema and plus dorsalis pedis pulses bilaterally  EKG not performed today  ASSESSMENT AND PLAN:   Peripheral arterial disease Patient was referred in because of abnormal lower extremity arterial Doppler studies. He does not positive cardiovascular risk factors including remote tobacco abuse, hypertension and hyperlipidemia. He has mild CAD but predominantly nonischemic cardiomyopathy. He does complain of some mild calf claudication but this occurs at rest as well. Lower extremity arterial Doppler studies performed in our office 07/12/14 revealed ABIs in the 1 range bilaterally with an occluded right posterior tibial artery. There were no open wounds or suggestion of critical limb ischemia. He has 2+ pedal pulses bilaterally in the dorsalis this distribution. At this point, I do not feel further workup is required. I am not convinced that his symptoms are related to claudication. I will see him back when necessary      Lorretta Harp MD [facility] 08/15/2014 10:26 AM

## 2014-08-15 NOTE — Patient Instructions (Signed)
Follow up with Dr Berry as needed.  

## 2014-08-15 NOTE — Assessment & Plan Note (Signed)
Patient was referred in because of abnormal lower extremity arterial Doppler studies. He does not positive cardiovascular risk factors including remote tobacco abuse, hypertension and hyperlipidemia. He has mild CAD but predominantly nonischemic cardiomyopathy. He does complain of some mild calf claudication but this occurs at rest as well. Lower extremity arterial Doppler studies performed in our office 07/12/14 revealed ABIs in the 1 range bilaterally with an occluded right posterior tibial artery. There were no open wounds or suggestion of critical limb ischemia. He has 2+ pedal pulses bilaterally in the dorsalis this distribution. At this point, I do not feel further workup is required. I am not convinced that his symptoms are related to claudication. I will see him back when necessary

## 2014-09-13 ENCOUNTER — Other Ambulatory Visit: Payer: Self-pay | Admitting: Cardiovascular Disease

## 2014-09-14 NOTE — Telephone Encounter (Signed)
Rx was sent to pharmacy electronically. 

## 2014-10-31 ENCOUNTER — Encounter (HOSPITAL_COMMUNITY): Payer: Self-pay | Admitting: Cardiology

## 2015-01-19 ENCOUNTER — Encounter (HOSPITAL_COMMUNITY): Payer: Self-pay | Admitting: Emergency Medicine

## 2015-01-19 ENCOUNTER — Emergency Department (HOSPITAL_COMMUNITY)
Admission: EM | Admit: 2015-01-19 | Discharge: 2015-01-19 | Disposition: A | Discharge status: Home / Self Care | Payer: Medicare Other | Attending: Emergency Medicine | Admitting: Emergency Medicine

## 2015-01-19 ENCOUNTER — Emergency Department (HOSPITAL_COMMUNITY): Payer: Medicare Other

## 2015-01-19 DIAGNOSIS — Z79899 Other long term (current) drug therapy: Secondary | ICD-10-CM | POA: Insufficient documentation

## 2015-01-19 DIAGNOSIS — J159 Unspecified bacterial pneumonia: Secondary | ICD-10-CM | POA: Insufficient documentation

## 2015-01-19 DIAGNOSIS — Z87891 Personal history of nicotine dependence: Secondary | ICD-10-CM | POA: Insufficient documentation

## 2015-01-19 DIAGNOSIS — Z9889 Other specified postprocedural states: Secondary | ICD-10-CM | POA: Insufficient documentation

## 2015-01-19 DIAGNOSIS — I252 Old myocardial infarction: Secondary | ICD-10-CM | POA: Insufficient documentation

## 2015-01-19 DIAGNOSIS — N183 Chronic kidney disease, stage 3 (moderate): Secondary | ICD-10-CM | POA: Diagnosis not present

## 2015-01-19 DIAGNOSIS — I25110 Atherosclerotic heart disease of native coronary artery with unstable angina pectoris: Secondary | ICD-10-CM | POA: Diagnosis not present

## 2015-01-19 DIAGNOSIS — I5023 Acute on chronic systolic (congestive) heart failure: Secondary | ICD-10-CM | POA: Insufficient documentation

## 2015-01-19 DIAGNOSIS — Z86010 Personal history of colonic polyps: Secondary | ICD-10-CM | POA: Insufficient documentation

## 2015-01-19 DIAGNOSIS — H548 Legal blindness, as defined in USA: Secondary | ICD-10-CM | POA: Diagnosis not present

## 2015-01-19 DIAGNOSIS — I129 Hypertensive chronic kidney disease with stage 1 through stage 4 chronic kidney disease, or unspecified chronic kidney disease: Secondary | ICD-10-CM | POA: Diagnosis not present

## 2015-01-19 DIAGNOSIS — Z7982 Long term (current) use of aspirin: Secondary | ICD-10-CM | POA: Insufficient documentation

## 2015-01-19 DIAGNOSIS — R05 Cough: Secondary | ICD-10-CM | POA: Diagnosis present

## 2015-01-19 DIAGNOSIS — M4310 Spondylolisthesis, site unspecified: Secondary | ICD-10-CM | POA: Insufficient documentation

## 2015-01-19 DIAGNOSIS — J189 Pneumonia, unspecified organism: Secondary | ICD-10-CM

## 2015-01-19 LAB — CBC WITH DIFFERENTIAL/PLATELET
BASOS ABS: 0 10*3/uL (ref 0.0–0.1)
Basophils Relative: 0 % (ref 0–1)
EOS ABS: 0.2 10*3/uL (ref 0.0–0.7)
EOS PCT: 4 % (ref 0–5)
HCT: 39.3 % (ref 39.0–52.0)
Hemoglobin: 13.8 g/dL (ref 13.0–17.0)
LYMPHS PCT: 37 % (ref 12–46)
Lymphs Abs: 1.7 10*3/uL (ref 0.7–4.0)
MCH: 30.4 pg (ref 26.0–34.0)
MCHC: 35.1 g/dL (ref 30.0–36.0)
MCV: 86.6 fL (ref 78.0–100.0)
MONO ABS: 0.5 10*3/uL (ref 0.1–1.0)
MONOS PCT: 11 % (ref 3–12)
NEUTROS ABS: 2.2 10*3/uL (ref 1.7–7.7)
NEUTROS PCT: 48 % (ref 43–77)
Platelets: 142 10*3/uL — ABNORMAL LOW (ref 150–400)
RBC: 4.54 MIL/uL (ref 4.22–5.81)
RDW: 12.5 % (ref 11.5–15.5)
WBC: 4.6 10*3/uL (ref 4.0–10.5)

## 2015-01-19 LAB — BASIC METABOLIC PANEL
Anion gap: 5 (ref 5–15)
BUN: 19 mg/dL (ref 6–23)
CALCIUM: 9 mg/dL (ref 8.4–10.5)
CO2: 27 mmol/L (ref 19–32)
Chloride: 106 mmol/L (ref 96–112)
Creatinine, Ser: 1.55 mg/dL — ABNORMAL HIGH (ref 0.50–1.35)
GFR, EST AFRICAN AMERICAN: 48 mL/min — AB (ref >90)
GFR, EST NON AFRICAN AMERICAN: 42 mL/min — AB (ref >90)
GLUCOSE: 111 mg/dL — AB (ref 70–99)
Potassium: 4 mmol/L (ref 3.5–5.1)
SODIUM: 138 mmol/L (ref 135–145)

## 2015-01-19 LAB — I-STAT TROPONIN, ED: TROPONIN I, POC: 0 ng/mL (ref 0.00–0.08)

## 2015-01-19 LAB — I-STAT CG4 LACTIC ACID, ED: Lactic Acid, Venous: 1.32 mmol/L (ref 0.5–2.0)

## 2015-01-19 MED ORDER — LEVOFLOXACIN 500 MG PO TABS: 500.0000 mg | ORAL_TABLET | Freq: Every day | ORAL | Status: DC

## 2015-01-19 MED ORDER — LEVOFLOXACIN 500 MG PO TABS
500.0000 mg | ORAL_TABLET | Freq: Once | ORAL | Status: AC
  Administered 2015-01-19: 500 mg via ORAL
  Filled 2015-01-19: qty 1

## 2015-01-19 NOTE — ED Provider Notes (Addendum)
CSN: [id number]     Arrival date & time 01/19/15  1137 History   First MD Initiated Contact with Patient 01/19/15 1405     Chief Complaint  Patient presents with  . Chest Pain  . Cough     (Consider location/radiation/quality/duration/timing/severity/associated sxs/prior Treatment) Patient is a 74 y.o. male presenting with cough. The history is provided by the patient.  Cough Cough characteristics:  Non-productive and hacking Severity:  Moderate Onset quality:  Gradual Duration:  5 days Timing:  Constant Progression:  Worsening Chronicity:  New Smoker: no   Context: upper respiratory infection   Relieved by:  None tried Worsened by:  Nothing tried Ineffective treatments:  None tried Associated symptoms: chest pain, chills, fever, myalgias and rhinorrhea   Associated symptoms: no shortness of breath   Associated symptoms comment:  Chest pain on the right side with coughing only Risk factors: no recent travel   Risk factors comment:  No hospitalizations within the last 3 months   Past Medical History  Diagnosis Date  . Colon polyp, hyperplastic   . Spondylolisthesis   . Hypertension   . CHF (congestive heart failure)   . Unstable angina 11/22/2011  . Respiratory failure, acute 11/22/2011  . CAD (coronary artery disease) 80% stenosis diag of the LAD, 30% in OM2 branch of LCX in 2009 11/22/2011  . Accelerated hypertension 11/22/2011  . Unstable angina 05/26/2012  . LV dysfunction, hx of EF 20% but most recent Echo 03/14/12 EF 35 -40%, life vest discontinued 05/26/2012  . Nonischemic cardiomyopathy   . Ventricular ectopy Hx of PVCs 11/22/2011  . 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 Myocardial infarction 11/22/11  . Acute bronchitis 05/26/12  . Exertional dyspnea   . Legally blind     "both eyes"  . 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 CKD (chronic kidney disease) stage 3, GFR 30-59 ml/min 05/27/2012  . Acute on chronic  systolic CHF (congestive heart failure) 06/05/2012  . History of stress test 06/01/2012    Normal myocardial perfusion study. compared to the previous study there is no significant change. this is a low risk scan  . Peripheral arterial disease    Past Surgical History  Procedure Laterality Date  . Cystoscopy    . Rotator cuff repair  2000's    left  . Back surgery    . Cardiac catheterization  11/2011  . Posterior fusion lumbar spine  1979  . Cataract extraction, bilateral  1990's  . Ep study and ablation of vt  7/13    PVC focus mapped to the right coronary cusp of the aorta, limited ablation performed due to proximity of the focus to the right coronary artery  . Eye surgery    . Cystoscopy with urethral dilatation N/A 05/04/2013    Procedure: CYSTOSCOPY WITH URETHRAL DILATATION;  Surgeon: Eustace Moore, MD;  Location: [place] [facility];  Service: Neurosurgery;  Laterality: N/A;  with insertion of foley catheter  . Cardiac catheterization  11/2011    didn't demonstrate high grade obstructive disease to account for his LV dysfunction.  . Left heart catheterization with coronary angiogram N/A 11/25/2011    Procedure: LEFT HEART CATHETERIZATION WITH CORONARY ANGIOGRAM;  Surgeon: Leonie Man, MD;  Location: [facility] CATH LAB;  Service: Cardiovascular;  Laterality: N/A;  . V-tach ablation N/A 06/06/2012    Procedure: V-TACH ABLATION;  Surgeon: Thompson Grayer, MD;  Location: [facility] CATH LAB;  Service: Cardiovascular;  Laterality: N/A;   History reviewed. No pertinent family history. History  Substance Use Topics  . Smoking status: Former Smoker -- 50 years    Types: Cigarettes    Quit date: 11/23/1999  . Smokeless tobacco: Never Used  . 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  HENT: Positive for rhinorrhea.   Respiratory: Positive for cough. Negative for shortness of breath.    Cardiovascular: Positive for chest pain.  Musculoskeletal: Positive for myalgias.  All other systems reviewed and are negative.     Allergies  Review of patient's allergies indicates no known allergies.  Home Medications   Prior to Admission medications   Medication Sig Start Date End Date Taking? Authorizing Provider  amLODipine (NORVASC) 10 MG tablet TAKE 1 TABLET BY MOUTH EVERY DAY 07/22/14   Troy Sine, MD  aspirin 325 MG tablet Take 325 mg by mouth every morning.     Historical Provider, MD  bimatoprost (LUMIGAN) 0.01 % SOLN Place 1 drop into both eyes at bedtime.    Historical Provider, MD  cycloSPORINE (RESTASIS) 0.05 % ophthalmic emulsion Place 1 drop into both eyes 2 (two) times daily.    Historical Provider, MD  furosemide (LASIX) 20 MG tablet Take 1 tablet (20 mg total) by mouth daily. 11/27/11   Mihai Croitoru, MD  isosorbide mononitrate (IMDUR) 30 MG 24 hr tablet TAKE 1 TABLET BY MOUTH EVERY DAY 09/14/14   Troy Sine, MD  nitroGLYCERIN (NITROSTAT) 0.4 MG SL tablet Place 1 tablet (0.4 mg total) under the tongue every 5 (five) minutes as needed for chest pain. 05/30/12   Isaiah Serge, NP  potassium chloride SA (K-DUR,KLOR-CON) 20 MEQ tablet Take 20 mEq by mouth daily.     Historical Provider, MD  pravastatin (PRAVACHOL) 40 MG tablet Take 40 mg by mouth every morning.     Historical Provider, MD  spironolactone (ALDACTONE) 25 MG tablet Take 25 mg by mouth daily.    Historical Provider, MD  spironolactone (ALDACTONE) 25 MG tablet TAKE 1/2 TABLET BY MOUTH TWICE DAILY 08/01/14   Troy Sine, MD  traMADol (ULTRAM) 50 MG tablet Take 1 tablet (50 mg total) by mouth every 6 (six) hours as needed. 07/19/14   Domenic Moras, PA-C   BP 141/69 mmHg  Pulse 61  Temp(Src) 97.7 F (36.5 C) (Oral)  Resp 19  SpO2 100% Physical Exam  Constitutional: He is oriented to person, place, and time. He appears well-developed and well-nourished. No distress.  HENT:  Head: Normocephalic and  atraumatic.  Mouth/Throat: Oropharynx is clear and moist.  Eyes: Conjunctivae and EOM are normal. Pupils are equal, round, and reactive to light.  Neck: Normal range of motion. Neck supple.  Cardiovascular: Normal rate, regular rhythm and intact distal pulses.   No murmur heard. Pulmonary/Chest: Effort normal. No respiratory distress. He has no wheezes. He has rhonchi in the right lower field. He has no rales.  Abdominal: Soft. He exhibits no distension. There is no tenderness. There is no rebound and no guarding.  Musculoskeletal: Normal range of motion. He exhibits no edema or tenderness.  Neurological: He is alert and oriented to person, place, and time.  Skin: Skin is warm and dry. No rash noted. No erythema.  Psychiatric: He has a normal mood and affect. His behavior is normal.  Nursing note and vitals reviewed.  ED Course  Procedures (including critical care time) Labs Review Labs Reviewed  BASIC METABOLIC PANEL - Abnormal; Notable for the following:    Glucose, Bld 111 (*)    Creatinine, Ser 1.55 (*)    GFR calc non Af Amer 42 (*)    GFR calc Af Amer 48 (*)    All other components within normal limits  CBC WITH DIFFERENTIAL/PLATELET - Abnormal; Notable for the following:    Platelets 142 (*)    All other components within normal limits  I-STAT TROPOININ, ED  I-STAT CG4 LACTIC ACID, ED    Imaging Review Dg Chest 2 View  01/19/2015   CLINICAL DATA:  74 year old male with chest pain, congestion and weakness  EXAM: CHEST  2 VIEW  COMPARISON:  Prior chest x-ray 05/12/2014  FINDINGS: Stable cardiac and mediastinal contours. No pleural effusion, pulmonary edema or pneumothorax. Incompletely imaged posterior thoraco lumbar fusion hardware. No acute osseous abnormality. Soft tissue anchors in the left humeral head suggest prior rotator cuff repair. Slightly increased subtle nodular opacities in the right lung base on the frontal view compared to prior imaging. Otherwise, the lungs  remain clear.  IMPRESSION: 1. Suggestion of slightly increased peribronchovascular nodularity in the right lung base on the frontal view. This could represent early bronchopneumonia or another infectious/inflammatory process in the appropriate clinical setting. Recommend repeat chest x-ray in 4- 6 weeks following an appropriate course of therapy to confirm resolution. If nodularity persists, CT scan may become warranted.   Electronically Signed   By: Jacqulynn Cadet M.D.   On: 01/19/2015 13:51     EKG Interpretation   Date/Time:  Sunday January 19 2015 11:41:57 EST Ventricular Rate:  60 PR Interval:  360 QRS Duration: 110 QT Interval:  420 QTC Calculation: 420 R Axis:     Text Interpretation:  Sinus rhythm with marked sinus arrhythmia with 1st  degree A-V block Nonspecific ST and T wave abnormality No significant  change since last tracing Confirmed by Maryan Rued  MD, Loree Fee ([id number]) on  01/19/2015 2:06:28 PM      MDM   Final diagnoses:  CAP (community acquired pneumonia)    Patient with signs and symptoms concerning for pneumonia. He denies significant shortness of breath but feels slightly winded with activity. He has had a flu shot and a pneumonia shot. He denies any lung disease and does not smoke or use inhalers regularly. X-ray today shows concern for early bronchopneumonia. Given patient's symptoms will treat with Levaquin. He is community-acquired pneumonia at this time. Will have him follow-up with Dr. Kennon Holter by Wednesday or Thursday and given strict return precautions.    Blanchie Dessert, MD 01/19/15 1761  Blanchie Dessert, MD 01/19/15 1505

## 2015-01-19 NOTE — ED Notes (Signed)
NAD noted. VSS. Pt ambulatory on discharge, pt refuses wheelchair

## 2015-01-19 NOTE — ED Notes (Signed)
Pt c/o CP worse with cough and congestion with fever x 4 days

## 2015-02-02 ENCOUNTER — Other Ambulatory Visit: Payer: Self-pay | Admitting: Cardiovascular Disease

## 2015-02-18 ENCOUNTER — Encounter (HOSPITAL_COMMUNITY): Payer: Self-pay | Admitting: *Deleted

## 2015-02-18 ENCOUNTER — Emergency Department (HOSPITAL_COMMUNITY): Payer: Medicare Other

## 2015-02-18 ENCOUNTER — Observation Stay (HOSPITAL_COMMUNITY)
Admission: EM | Admit: 2015-02-18 | Discharge: 2015-02-19 | Disposition: A | Discharge status: Home / Self Care | Payer: Medicare Other | Attending: Internal Medicine | Admitting: Internal Medicine

## 2015-02-18 ENCOUNTER — Observation Stay (HOSPITAL_COMMUNITY): Payer: Medicare Other

## 2015-02-18 DIAGNOSIS — Z87891 Personal history of nicotine dependence: Secondary | ICD-10-CM | POA: Insufficient documentation

## 2015-02-18 DIAGNOSIS — R079 Chest pain, unspecified: Principal | ICD-10-CM

## 2015-02-18 DIAGNOSIS — Z7982 Long term (current) use of aspirin: Secondary | ICD-10-CM | POA: Insufficient documentation

## 2015-02-18 DIAGNOSIS — N183 Chronic kidney disease, stage 3 unspecified: Secondary | ICD-10-CM | POA: Diagnosis present

## 2015-02-18 DIAGNOSIS — K219 Gastro-esophageal reflux disease without esophagitis: Secondary | ICD-10-CM | POA: Diagnosis not present

## 2015-02-18 DIAGNOSIS — I5022 Chronic systolic (congestive) heart failure: Secondary | ICD-10-CM | POA: Diagnosis not present

## 2015-02-18 DIAGNOSIS — J189 Pneumonia, unspecified organism: Secondary | ICD-10-CM | POA: Diagnosis not present

## 2015-02-18 DIAGNOSIS — I739 Peripheral vascular disease, unspecified: Secondary | ICD-10-CM | POA: Diagnosis present

## 2015-02-18 DIAGNOSIS — I129 Hypertensive chronic kidney disease with stage 1 through stage 4 chronic kidney disease, or unspecified chronic kidney disease: Secondary | ICD-10-CM | POA: Diagnosis not present

## 2015-02-18 DIAGNOSIS — I429 Cardiomyopathy, unspecified: Secondary | ICD-10-CM | POA: Diagnosis not present

## 2015-02-18 DIAGNOSIS — I2510 Atherosclerotic heart disease of native coronary artery without angina pectoris: Secondary | ICD-10-CM | POA: Diagnosis not present

## 2015-02-18 DIAGNOSIS — N1832 Chronic kidney disease, stage 3b: Secondary | ICD-10-CM | POA: Diagnosis present

## 2015-02-18 DIAGNOSIS — I252 Old myocardial infarction: Secondary | ICD-10-CM | POA: Insufficient documentation

## 2015-02-18 HISTORY — DX: Pneumonia, unspecified organism: J18.9

## 2015-02-18 LAB — STREP PNEUMONIAE URINARY ANTIGEN: Strep Pneumo Urinary Antigen: NEGATIVE

## 2015-02-18 LAB — COMPREHENSIVE METABOLIC PANEL
ALT: 16 U/L (ref 0–53)
AST: 21 U/L (ref 0–37)
Albumin: 3.6 g/dL (ref 3.5–5.2)
Alkaline Phosphatase: 64 U/L (ref 39–117)
Anion gap: 5 (ref 5–15)
BUN: 13 mg/dL (ref 6–23)
CO2: 27 mmol/L (ref 19–32)
Calcium: 9.4 mg/dL (ref 8.4–10.5)
Chloride: 107 mmol/L (ref 96–112)
Creatinine, Ser: 1.52 mg/dL — ABNORMAL HIGH (ref 0.50–1.35)
GFR calc Af Amer: 50 mL/min — ABNORMAL LOW (ref >90)
GFR calc non Af Amer: 43 mL/min — ABNORMAL LOW (ref >90)
Glucose, Bld: 145 mg/dL — ABNORMAL HIGH (ref 70–99)
Potassium: 3.9 mmol/L (ref 3.5–5.1)
Sodium: 139 mmol/L (ref 135–145)
Total Bilirubin: 0.8 mg/dL (ref 0.3–1.2)
Total Protein: 6.7 g/dL (ref 6.0–8.3)

## 2015-02-18 LAB — TROPONIN I: Troponin I: <0.03 ng/mL (ref <0.031)

## 2015-02-18 LAB — CBC
HCT: 42.2 % (ref 39.0–52.0)
HEMOGLOBIN: 14.3 g/dL (ref 13.0–17.0)
MCH: 30.2 pg (ref 26.0–34.0)
MCHC: 33.9 g/dL (ref 30.0–36.0)
MCV: 89 fL (ref 78.0–100.0)
Platelets: 146 10*3/uL — ABNORMAL LOW (ref 150–400)
RBC: 4.74 MIL/uL (ref 4.22–5.81)
RDW: 12.6 % (ref 11.5–15.5)
WBC: 4.3 10*3/uL (ref 4.0–10.5)

## 2015-02-18 LAB — MAGNESIUM: Magnesium: 1.8 mg/dL (ref 1.5–2.5)

## 2015-02-18 LAB — APTT: aPTT: 33 seconds (ref 24–37)

## 2015-02-18 LAB — PROTIME-INR
INR: 1.21 (ref 0.00–1.49)
Prothrombin Time: 15.4 seconds — ABNORMAL HIGH (ref 11.6–15.2)

## 2015-02-18 LAB — I-STAT TROPONIN, ED: Troponin i, poc: 0.01 ng/mL (ref 0.00–0.08)

## 2015-02-18 MED ORDER — ACETAMINOPHEN 650 MG RE SUPP: 650.0000 mg | Freq: Four times a day (QID) | RECTAL | Status: DC | PRN

## 2015-02-18 MED ORDER — FUROSEMIDE 20 MG PO TABS
20.0000 mg | ORAL_TABLET | Freq: Every day | ORAL | Status: DC
  Administered 2015-02-19: 20 mg via ORAL
  Filled 2015-02-18: qty 1

## 2015-02-18 MED ORDER — HYDROCODONE-ACETAMINOPHEN 5-325 MG PO TABS
1.0000 | ORAL_TABLET | ORAL | Status: DC | PRN
  Administered 2015-02-18 – 2015-02-19 (×2): 1 via ORAL
  Filled 2015-02-18 (×2): qty 1

## 2015-02-18 MED ORDER — AMLODIPINE BESYLATE 10 MG PO TABS
10.0000 mg | ORAL_TABLET | Freq: Every day | ORAL | Status: DC
  Administered 2015-02-19: 10 mg via ORAL
  Filled 2015-02-18: qty 1

## 2015-02-18 MED ORDER — HEPARIN SODIUM (PORCINE) 5000 UNIT/ML IJ SOLN
5000.0000 [IU] | Freq: Three times a day (TID) | INTRAMUSCULAR | Status: DC
  Administered 2015-02-18 – 2015-02-19 (×3): 5000 [IU] via SUBCUTANEOUS
  Filled 2015-02-18 (×4): qty 1

## 2015-02-18 MED ORDER — TIMOLOL MALEATE 0.5 % OP SOLG
1.0000 [drp] | Freq: Every day | OPHTHALMIC | Status: DC
  Administered 2015-02-19: 1 [drp] via OPHTHALMIC
  Filled 2015-02-18: qty 5

## 2015-02-18 MED ORDER — NITROGLYCERIN 0.4 MG SL SUBL
0.4000 mg | SUBLINGUAL_TABLET | SUBLINGUAL | Status: DC | PRN
  Administered 2015-02-18: 0.4 mg via SUBLINGUAL

## 2015-02-18 MED ORDER — LATANOPROST 0.005 % OP SOLN
1.0000 [drp] | Freq: Every day | OPHTHALMIC | Status: DC
  Administered 2015-02-18: 1 [drp] via OPHTHALMIC
  Filled 2015-02-18: qty 2.5

## 2015-02-18 MED ORDER — CYCLOSPORINE 0.05 % OP EMUL
1.0000 [drp] | Freq: Every day | OPHTHALMIC | Status: DC
  Administered 2015-02-19: 1 [drp] via OPHTHALMIC
  Filled 2015-02-18: qty 1

## 2015-02-18 MED ORDER — ASPIRIN 325 MG PO TABS: 81.0000 mg | ORAL_TABLET | Freq: Every day | ORAL | Status: DC

## 2015-02-18 MED ORDER — ONDANSETRON HCL 4 MG/2ML IJ SOLN
4.0000 mg | Freq: Four times a day (QID) | INTRAMUSCULAR | Status: DC | PRN
Start: 2015-02-18 — End: 2015-02-19

## 2015-02-18 MED ORDER — POTASSIUM CHLORIDE CRYS ER 20 MEQ PO TBCR
20.0000 meq | EXTENDED_RELEASE_TABLET | Freq: Every day | ORAL | Status: DC
  Administered 2015-02-19: 20 meq via ORAL
  Filled 2015-02-18: qty 1

## 2015-02-18 MED ORDER — ISOSORBIDE MONONITRATE ER 30 MG PO TB24
30.0000 mg | ORAL_TABLET | Freq: Every day | ORAL | Status: DC
  Administered 2015-02-19: 30 mg via ORAL
  Filled 2015-02-18: qty 1

## 2015-02-18 MED ORDER — AZITHROMYCIN 500 MG IV SOLR
500.0000 mg | Freq: Once | INTRAVENOUS | Status: AC
  Administered 2015-02-18: 500 mg via INTRAVENOUS
  Filled 2015-02-18: qty 500

## 2015-02-18 MED ORDER — AZITHROMYCIN 500 MG PO TABS
500.0000 mg | ORAL_TABLET | ORAL | Status: DC
  Administered 2015-02-19: 500 mg via ORAL
  Filled 2015-02-18: qty 1

## 2015-02-18 MED ORDER — IOHEXOL 350 MG/ML SOLN
80.0000 mL | Freq: Once | INTRAVENOUS | Status: AC | PRN
  Administered 2015-02-18: 80 mL via INTRAVENOUS

## 2015-02-18 MED ORDER — ACETAMINOPHEN 325 MG PO TABS
650.0000 mg | ORAL_TABLET | Freq: Four times a day (QID) | ORAL | Status: DC | PRN
  Administered 2015-02-18: 650 mg via ORAL
  Filled 2015-02-18 (×2): qty 2

## 2015-02-18 MED ORDER — PRAVASTATIN SODIUM 40 MG PO TABS
40.0000 mg | ORAL_TABLET | Freq: Every day | ORAL | Status: DC
  Administered 2015-02-19: 40 mg via ORAL
  Filled 2015-02-18: qty 1

## 2015-02-18 MED ORDER — ONDANSETRON HCL 4 MG PO TABS: 4.0000 mg | ORAL_TABLET | Freq: Four times a day (QID) | ORAL | Status: DC | PRN

## 2015-02-18 MED ORDER — ONDANSETRON HCL 4 MG/2ML IJ SOLN: 4.0000 mg | Freq: Three times a day (TID) | INTRAMUSCULAR | Status: DC | PRN

## 2015-02-18 MED ORDER — ASPIRIN 81 MG PO CHEW
81.0000 mg | CHEWABLE_TABLET | Freq: Every day | ORAL | Status: DC
  Administered 2015-02-19: 81 mg via ORAL
  Filled 2015-02-18: qty 1

## 2015-02-18 MED ORDER — SODIUM CHLORIDE 0.9 % IJ SOLN
3.0000 mL | Freq: Two times a day (BID) | INTRAMUSCULAR | Status: DC
  Administered 2015-02-18 – 2015-02-19 (×2): 3 mL via INTRAVENOUS

## 2015-02-18 MED ORDER — SPIRONOLACTONE 12.5 MG HALF TABLET
12.5000 mg | ORAL_TABLET | Freq: Every day | ORAL | Status: DC
  Administered 2015-02-19: 12.5 mg via ORAL
  Filled 2015-02-18: qty 1

## 2015-02-18 MED ORDER — GUAIFENESIN-DM 100-10 MG/5ML PO SYRP: 5.0000 mL | ORAL_SOLUTION | ORAL | Status: DC | PRN

## 2015-02-18 MED ORDER — ACETAMINOPHEN 325 MG PO TABS: 650.0000 mg | ORAL_TABLET | Freq: Four times a day (QID) | ORAL | Status: DC | PRN

## 2015-02-18 MED ORDER — ADULT MULTIVITAMIN W/MINERALS CH
1.0000 | ORAL_TABLET | Freq: Every day | ORAL | Status: DC
  Administered 2015-02-19: 1 via ORAL
  Filled 2015-02-18: qty 1

## 2015-02-18 MED ORDER — CEFTRIAXONE SODIUM IN DEXTROSE 20 MG/ML IV SOLN
1.0000 g | INTRAVENOUS | Status: DC
  Administered 2015-02-19: 1 g via INTRAVENOUS
  Filled 2015-02-18: qty 50

## 2015-02-18 MED ORDER — DEXTROSE 5 % IV SOLN
1.0000 g | Freq: Once | INTRAVENOUS | Status: AC
  Administered 2015-02-18: 1 g via INTRAVENOUS
  Filled 2015-02-18: qty 10

## 2015-02-18 NOTE — Progress Notes (Addendum)
Per chart pt with hx Second degree HB type I. Pt has been in 1 degree HB today. When pt brady to 30s non-sustained, rhythm looks like second degree HB type II. K Schorr paged to notify of rhythm via amion textpage. Pt still asymptomatic. Will continue to monitor. 22:43  Spoke with Lamar Blinks, stated to notify if pt sustaining in this rhythm or symptomatic. Will continue to monitor. 23:00Rosenberger, Illene Labrador, RN

## 2015-02-18 NOTE — H&P (Addendum)
[facility] History and Physical  [name] YQM:[id number] DOB: 1941-08-07 DOA: 02/18/2015  Referring physician: Dr Audie Pinto PCP: Elizabeth Palau, MD   Chief Complaint:  Left sided chest pain x 1 day  HPI:  74 year old male with history of coronary artery disease, CHF with last EF of 55-60%, peripheral vascular disease, hypertension, chronic kidney disease stage II who presented to the ED with left-sided chest pain since last night. Patient reports being diagnosed of pneumonia one month back and treated with antibiotics. Last night he started having sharp chest pain over left breast which subsided after a few minutes. This morning the pain again recurred again and felt like his left breast was swollen and tender. He denies any radiation of the pain, shortness of breath, orthopnea or PND. Reports some dry cough as well.  Patient denies headache, dizziness, fever, chills, nausea , vomiting,  palpitations,  abdominal pain, bowel or urinary symptoms. Denies change in weight or appetite.  Course in the ED Patient's vitals were stable. Blood work done showed normal CBC, chemistry showed sodium of 139, K of 2.9, creatinine 1.52 (at baseline), glucose of 145. Chest x-ray showed left upper lobe opacity. This was followed by a CT angiogram of the chest to rule out PE and again showed patchy left upper lobe infiltrate. Initial troponin was negative. EKG done showed prolonged PR interval with T-wave inversion in inferior and lateral leads (unchanged from previous EKG)  started on empiric Rocephin and azithromycin and hospitalists admission requested to observation telemetry  Review of Systems:  As outlined in history of present illness. 12 point Review of systems otherwise unremarkable   Past Medical History  Diagnosis Date  . Colon polyp, hyperplastic   . Spondylolisthesis   . Hypertension   . CHF (congestive heart failure)   . Unstable angina 11/22/2011  . Respiratory failure,  acute 11/22/2011  . CAD (coronary artery disease) 80% stenosis diag of the LAD, 30% in OM2 branch of LCX in 2009 11/22/2011  . Accelerated hypertension 11/22/2011  . Unstable angina 05/26/2012  . LV dysfunction, hx of EF 20% but most recent Echo 03/14/12 EF 35 -40%, life vest discontinued 05/26/2012  . Nonischemic cardiomyopathy   . Ventricular ectopy Hx of PVCs 11/22/2011  . 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 Myocardial infarction 11/22/11  . Acute bronchitis 05/26/12  . Exertional dyspnea   . Legally blind     "both eyes"  . 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 CKD (chronic kidney disease) stage 3, GFR 30-59 ml/min 05/27/2012  . Acute on chronic systolic CHF (congestive heart failure) 06/05/2012  . History of stress test 06/01/2012    Normal myocardial perfusion study. compared to the previous study there is no significant change. this is a low risk scan  . Peripheral arterial disease    Past Surgical History  Procedure Laterality Date  . Cystoscopy    . Rotator cuff repair  2000's    left  . Back surgery    . Cardiac catheterization  11/2011  . Posterior fusion lumbar spine  1979  . Cataract extraction, bilateral  1990's  . Ep study and ablation of vt  7/13    PVC focus mapped to the right coronary cusp of the aorta, limited ablation performed due to proximity of the focus to the right coronary artery  . Eye surgery    . Cystoscopy with urethral dilatation N/A 05/04/2013    Procedure:  CYSTOSCOPY WITH URETHRAL DILATATION;  Surgeon: Eustace Moore, MD;  Location: [place] [facility];  Service: Neurosurgery;  Laterality: N/A;  with insertion of foley catheter  . Cardiac catheterization  11/2011    didn't demonstrate high grade obstructive disease to account for his LV dysfunction.  . Left heart catheterization with coronary angiogram N/A 11/25/2011    Procedure: LEFT HEART CATHETERIZATION WITH CORONARY ANGIOGRAM;  Surgeon: Leonie Man, MD;   Location: [facility] CATH LAB;  Service: Cardiovascular;  Laterality: N/A;  . V-tach ablation N/A 06/06/2012    Procedure: V-TACH ABLATION;  Surgeon: Thompson Grayer, MD;  Location: [facility] CATH LAB;  Service: Cardiovascular;  Laterality: N/A;   Social History:  reports that he quit smoking about 15 years ago. His smoking use included Cigarettes. He quit after 50 years of use. He has never used smokeless tobacco. He reports that he does not drink alcohol or use illicit drugs.  No Known Allergies  History reviewed. No pertinent family history.  Prior to Admission medications   Medication Sig Start Date End Date Taking? Authorizing Provider  acetaminophen (TYLENOL) 325 MG tablet Take 650 mg by mouth every 6 (six) hours as needed for mild pain or headache.   Yes Historical Provider, MD  amLODipine (NORVASC) 10 MG tablet Take 10 mg by mouth daily. 01/22/15  Yes Historical Provider, MD  aspirin 325 MG tablet Take 81 mg by mouth daily.    Yes Historical Provider, MD  furosemide (LASIX) 20 MG tablet Take 20 mg by mouth daily. 01/22/15  Yes Historical Provider, MD  Iron-Vitamins (GERITOL COMPLETE PO) Take 1 tablet by mouth daily.   Yes Historical Provider, MD  isosorbide mononitrate (IMDUR) 30 MG 24 hr tablet Take 30 mg by mouth daily. 02/13/15  Yes Historical Provider, MD  LUMIGAN 0.01 % SOLN Place 1 drop into both eyes daily. 12/06/14  Yes Historical Provider, MD  potassium chloride SA (K-DUR,KLOR-CON) 20 MEQ tablet Take 20 mEq by mouth daily.   Yes Historical Provider, MD  pravastatin (PRAVACHOL) 40 MG tablet Take 40 mg by mouth daily. 02/13/15  Yes Historical Provider, MD  RESTASIS 0.05 % ophthalmic emulsion Place 1 drop into both eyes daily. 12/30/14  Yes Historical Provider, MD  spironolactone (ALDACTONE) 25 MG tablet Take 12.5 mg by mouth daily. 02/01/15  Yes Historical Provider, MD  timolol (TIMOPTIC-XR) 0.5 % ophthalmic gel-forming Place 1 drop into both eyes daily. 12/10/14  Yes Historical Provider, MD     Physical  Exam:  Filed Vitals:   02/18/15 1430 02/18/15 1445 02/18/15 1523 02/18/15 1609  BP: 140/70 149/73 130/72 138/67  Pulse: 50 49 55 54  Temp:    97.8 F (36.6 C)  TempSrc:    Oral  Resp: 14 16 15 18   Height:    5\' 8"  (1.727 m)  Weight:    74.2 kg (163 lb 9.3 oz)  SpO2: 100% 100% 100% 100%    Constitutional: Vital signs reviewed.  Elderly male in no acute distress HEENT: no pallor, no icterus, moist oral mucosa, no cervical lymphadenopathy Cardiovascular: RRR, S1 normal, S2 normal, no MRG Chest: CTAB, no wheezes, rales, or rhonchi, tender to pressure over her left breast, no swelling , warmth or erythema noted Abdominal: Soft. Non-tender, non-distended, bowel sounds are normal,  Ext: warm, no edema Neurological: Alert and oriented  Labs on Admission:  Basic Metabolic Panel:  Recent Labs Lab 02/18/15 0930  NA 139  K 3.9  CL 107  CO2 27  GLUCOSE 145*  BUN 13  CREATININE 1.52*  CALCIUM 9.4  MG 1.8   Liver Function Tests:  Recent Labs Lab 02/18/15 0930  AST 21  ALT 16  ALKPHOS 64  BILITOT 0.8  PROT 6.7  ALBUMIN 3.6   No results for input(s): LIPASE, AMYLASE in the last 168 hours. No results for input(s): AMMONIA in the last 168 hours. CBC:  Recent Labs Lab 02/18/15 0930  WBC 4.3  HGB 14.3  HCT 42.2  MCV 89.0  PLT 146*   Cardiac Enzymes: No results for input(s): CKTOTAL, CKMB, CKMBINDEX, TROPONINI in the last 168 hours. BNP: Invalid input(s): POCBNP CBG: No results for input(s): GLUCAP in the last 168 hours.  Radiological Exams on Admission: Dg Chest 2 View  02/18/2015   CLINICAL DATA:  Left-sided chest pain. Shortness of breath. History of acute bronchitis.  EXAM: CHEST  2 VIEW  COMPARISON:  Chest radiograph earlier today. Chest CT earlier today.  FINDINGS: Cardiac size within normal limits. Calcified tortuous aorta. Patchy LEFT upper lobe opacity better visualized on CT.  There has been previous lower thoracic and lumbar fusion. There is loosening of  both screws at the upper aspect of the fusion, although it is unclear if this is T10 or T11.  No pleural effusion or pneumothorax. Mediastinal contours preserved. Acute osseous findings.  IMPRESSION: LEFT upper lobe opacity better visualized on CT. Minimal asymmetry on chest radiograph.  Tortuous aorta with normal heart size.  Loosening of the screws at the upper aspect of the thoracolumbar fusion construct.   Electronically Signed   By: Rolla Flatten M.D.   On: 02/18/2015 15:33   Ct Angio Chest Pe W/cm &/or Wo Cm  02/18/2015   CLINICAL DATA:  Difficulty breathing  EXAM: CT ANGIOGRAPHY CHEST WITH CONTRAST  TECHNIQUE: Multidetector CT imaging of the chest was performed using the standard protocol during bolus administration of intravenous contrast. Multiplanar CT image reconstructions and MIPs were obtained to evaluate the vascular anatomy.  CONTRAST:  75mL OMNIPAQUE IOHEXOL 350 MG/ML SOLN  COMPARISON:  None.  FINDINGS: The lungs are well aerated bilaterally and demonstrate mild emphysematous changes. Minimal patchy left upper lobe infiltrate is seen. This may represent some early pneumonia.  The thoracic inlet is unremarkable. The thoracic aorta shows calcific changes without aneurysmal dilatation or dissection. The pulmonary artery shows a normal branching pattern. No filling defects to suggest pulmonary emboli are identified. No hilar or mediastinal adenopathy is seen. Mild coronary calcifications are noted.  The upper abdomen reveals no obstructive change. A 10 mm stone is noted in the upper pole of the right kidney. The remainder the upper abdomen is within normal limits. Postsurgical changes are noted in the lower thoracic spine.  Review of the MIP images confirms the above findings.  IMPRESSION: No evidence of pulmonary emboli.  Patchy left upper lobe infiltrate.  Nonobstructing right renal stone.   Electronically Signed   By: Inez Catalina M.D.   On: 02/18/2015 13:01   Dg Chest Portable 1 View  02/18/2015    CLINICAL DATA:  74 year old male with a history of chest pain.  EXAM: PORTABLE CHEST - 1 VIEW  COMPARISON:  01/19/2015, 05/12/2014  FINDINGS: Cardiomediastinal silhouette within normal limits in size and contour. No evidence of pulmonary vascular congestion.  Low lung volumes. Chronic coarsening of the interstitium, with suggestion of bronchial wall thickening at the lung bases. No confluent airspace disease.  No pleural effusion or pneumothorax.  No displaced fracture.  Surgical changes incompletely visualized of the thoracic spine and left humeral  head.  IMPRESSION: Interstitial opacities at the lung bases may reflect chronic or acute on chronic bronchial inflammation/infection, with no evidence of lobar pneumonia.  Signed,  Dulcy Fanny. Earleen Newport, DO  [facility]  [facility]   Electronically Signed   By: Corrie Mckusick D.O.   On: 02/18/2015 10:01    EKG: Normal sinus rhythm with first-degree AV block, nonspecific T-wave inversion in inferior lateral leads (unchanged from prior EKG)  Assessment/Plan Principal Problem:   CAP (community acquired pneumonia) continue IV rocephin and azithromycin. Follow blood cx. Sputum cx, urine for strep and legionella.  Supportive care with antitussives  Active Problems:   Chest pain at rest Appears to be atypical and musculoskeletal. Underlying CAD and cardiomyopathy with normal EF per last echo. Will cycle serial enzymes and EKG. Monitor on telemetry. We'll also consult if chest pain persists, changes on telemetry or elevated cardiac enzymes.  CAD and history of cardiomyopathy Continue aspirin, tachypneic, Imdur, Lasix and Aldactone    CKD (chronic kidney disease) stage 3, GFR 30-59 ml/min  at baseline.     Peripheral arterial disease continue aspirin. Follows with Dr. Gwenlyn Found.       Diet:cardiac  DVT prophylaxis: sq heparin   Code Status: full code Family Communication: discussed with wife at  bedside Disposition Plan: Observe on telemetry overnight. If stable can be discharged home tomorrow  Louellen Molder [facility] Pager [phone]  Total time spent on admission :50 minutes  If 7PM-7AM, please contact night-coverage [email or web address] Password [facility] 02/18/2015, 4:24 PM

## 2015-02-18 NOTE — Progress Notes (Addendum)
   02/18/15 2113  Pain Assessment  Pain Assessment 0-10  Pain Score 5  Pain Type Acute pain  Pain Location Chest  Pain Orientation Right;Left;Anterior;Mid;Lower;Upper  Pain Radiating Towards does not radiate  Pain Descriptors / Indicators Aching;Sore  Pain Onset Unable to tell  Patients Stated Pain Goal 0  Pain Intervention(s) Medication (See eMAR);MD notified (Comment)   Patient HR down to 37 non-sustained. Per dayshift report pt HR 40-50s in ED. Pt asymptomatic. No HR medications due tonight. HR sustaining between 45-60s currently. Pt still with complaints of chest pain that changes locations across chest with each pain assessment, pain is worse when lying down, chest pain is worse with palpation and deep breath and does not radiate. Pt given Norco per PRN order. MD paged with [facility] via amion. 21:25   Spoke with K Schorr with [facility], no new orders at this time, will continue to monitor and notify MD if additional pain medicine is needed for patient. Ronnette Hila, RN  21:35

## 2015-02-18 NOTE — ED Notes (Signed)
Pt reports onset of left chest pain last night. Reports area was swollen and had left arm numbness x 2 hours. Having non productive cough and intermittent right foot swelling.

## 2015-02-18 NOTE — ED Notes (Signed)
I-Stat troponin results of 0.01 reported to Dr.Beaton. Because hadn't crossed over.

## 2015-02-18 NOTE — ED Notes (Signed)
Attempted report X1

## 2015-02-18 NOTE — Progress Notes (Signed)
Patient came to the floor at 1550, alert and oriented, denies pain, no shortness of breath, SB  55 on the monitor. Will continue to monitor patient.

## 2015-02-19 DIAGNOSIS — I429 Cardiomyopathy, unspecified: Secondary | ICD-10-CM

## 2015-02-19 DIAGNOSIS — J189 Pneumonia, unspecified organism: Secondary | ICD-10-CM | POA: Diagnosis not present

## 2015-02-19 DIAGNOSIS — N183 Chronic kidney disease, stage 3 (moderate): Secondary | ICD-10-CM | POA: Diagnosis not present

## 2015-02-19 DIAGNOSIS — R079 Chest pain, unspecified: Secondary | ICD-10-CM | POA: Diagnosis not present

## 2015-02-19 LAB — LEGIONELLA ANTIGEN, URINE

## 2015-02-19 LAB — BASIC METABOLIC PANEL
Anion gap: 4 — ABNORMAL LOW (ref 5–15)
BUN: 14 mg/dL (ref 6–23)
CO2: 27 mmol/L (ref 19–32)
Calcium: 9 mg/dL (ref 8.4–10.5)
Chloride: 108 mmol/L (ref 96–112)
Creatinine, Ser: 1.41 mg/dL — ABNORMAL HIGH (ref 0.50–1.35)
GFR calc non Af Amer: 47 mL/min — ABNORMAL LOW (ref >90)
GFR, EST AFRICAN AMERICAN: 54 mL/min — AB (ref >90)
Glucose, Bld: 90 mg/dL (ref 70–99)
Potassium: 4.2 mmol/L (ref 3.5–5.1)
Sodium: 139 mmol/L (ref 135–145)

## 2015-02-19 LAB — HIV ANTIBODY (ROUTINE TESTING W REFLEX): HIV Screen 4th Generation wRfx: NONREACTIVE

## 2015-02-19 LAB — TROPONIN I

## 2015-02-19 MED ORDER — AZITHROMYCIN 500 MG PO TABS: 500.0000 mg | ORAL_TABLET | ORAL | Status: DC

## 2015-02-19 MED ORDER — HYDROCODONE-ACETAMINOPHEN 5-325 MG PO TABS: 1.0000 | ORAL_TABLET | ORAL | Status: DC | PRN

## 2015-02-19 MED ORDER — CEFUROXIME AXETIL 250 MG PO TABS: 250.0000 mg | ORAL_TABLET | Freq: Two times a day (BID) | ORAL | Status: DC

## 2015-02-19 NOTE — Progress Notes (Signed)
Nutrition Brief Note/ Consult  RD consulted for assessment of nutrition status/requirements.  Wt Readings from Last 15 Encounters:  02/19/15 166 lb 9.6 oz (75.569 kg)  08/15/14 169 lb 1.6 oz (76.703 kg)  07/08/14 155 lb (70.308 kg)  07/04/14 165 lb 14.4 oz (75.252 kg)  05/23/14 165 lb 8 oz (75.07 kg)  05/12/14 165 lb (74.844 kg)  02/12/14 168 lb (76.204 kg)  01/01/14 169 lb 4.8 oz (76.794 kg)  06/27/13 166 lb 3.2 oz (75.388 kg)  05/05/13 184 lb 1.4 oz (83.5 kg)  03/19/13 174 lb (78.926 kg)  01/08/13 181 lb (82.101 kg)  11/27/12 176 lb (79.833 kg)  09/18/12 171 lb (77.565 kg)  08/21/12 172 lb (78.019 kg)    Body mass index is 25.34 kg/(m^2). Patient meets criteria for Normal Weight based on current BMI. Pt denies any weight loss, reporting usual weight of 165 lbs. He reports having a good appetite and eating normally PTA.  Diet Recall:  B: Eggs, bacon or sausage, water, coffee L: Banana D: beans, corn, collards, chicken Snacks: candy, cookies  RD encouraged intake of fruits and vegetables; discussed benefits for heart health. Recommended avoiding foods higher in saturated fat (bacon/sausage) and finding lower fat alternatives such as [place]. Pt denies any questions or concerns regarding nutrition at this time.   Current diet order is Heart healhty, patient is consuming approximately 100% of meals at this time. Labs and medications reviewed.   Recommend checking Lipid Panel (last labs from 05/2012). No nutrition interventions warranted at this time. If nutrition issues arise, please re-consult RD.    Pryor Ochoa RD, LDN Inpatient Clinical Dietitian Pager: [phone] After Hours Pager: [phone]

## 2015-02-19 NOTE — Progress Notes (Signed)
UR completed 

## 2015-02-19 NOTE — Progress Notes (Signed)
I cosign all documentation and medication administration for this shift by Komicia Jeffries, Student RN.  

## 2015-02-19 NOTE — Progress Notes (Signed)
1711 all d/c instructions explained and given to pt. Verbalized understanding.   D/c off floor to awaiting transport.  Karie Kirks, RN

## 2015-02-19 NOTE — Discharge Summary (Signed)
Physician Discharge Summary  [name] HQI:[id number] DOB: 02-08-1941 DOA: 02/18/2015  PCP: Elizabeth Palau, MD  Admit date: 02/18/2015 Discharge date: 02/19/2015  Time spent: 20 minutes  Recommendations for Outpatient Follow-up:  1. 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 Briefly, pt presented with L sided chest pains x 1 day. The patient denied nausea, palpitations. He was admitted for further work up. The patient was found to have L sided pneumonia on imaging.  Hospital Course:   CAP (community acquired pneumonia) Patient was initially continued on IV rocephin and azithromycin.  Patient remained stable without fevers or leukocytosis Pt to be discharged on azithromycin with ceftin to complete course of abx  Active Problems:  Chest pain at rest Appears to be atypical and musculoskeletal vs related to CAP Pain is very reproducible on exam. Serial troponin was neg.  CAD and history of cardiomyopathy Continue aspirin, tachypneic, Imdur, Lasix and Aldactone   CKD (chronic kidney disease) stage 3, GFR 30-59 ml/min at baseline.   Peripheral arterial disease continue aspirin. Follows with Dr. Gwenlyn Found  Discharge Exam: Filed Vitals:   02/19/15 0350 02/19/15  0430 02/19/15 0957 02/19/15 1304  BP:  138/74 133/68 127/65  Pulse: 45 56 61 52  Temp:  97.8 F (36.6 C)  97.7 F (36.5 C)  TempSrc:  Oral  Oral  Resp:  18  18  Height:      Weight:  75.569 kg (166 lb 9.6 oz)    SpO2:  100%  96%    General: Awake, in nad Cardiovascular: regular, s1, s2 Respiratory: normal resp effort, no wheezing  Discharge Instructions     Medication List    TAKE these medications        acetaminophen 325 MG tablet  Commonly known as:  TYLENOL  Take 650 mg by mouth every 6 (six) hours as needed for mild pain or headache.     amLODipine 10 MG tablet  Commonly known as:  NORVASC  Take 10 mg by mouth daily.     aspirin 325 MG tablet  Take 81 mg by mouth daily.     azithromycin 500 MG tablet  Commonly known as:  ZITHROMAX  Take 1 tablet (500 mg total) by mouth daily.     cefUROXime 250 MG tablet  Commonly known as:  CEFTIN  Take 1 tablet (250 mg total) by mouth 2 (two) times daily with a meal.     furosemide 20 MG tablet  Commonly known as:  LASIX  Take 20 mg by mouth daily.     GERITOL COMPLETE PO  Take 1 tablet by mouth daily.     HYDROcodone-acetaminophen 5-325 MG per tablet  Commonly known as:  NORCO/VICODIN  Take 1-2  tablets by mouth every 4 (four) hours as needed for moderate pain.     isosorbide mononitrate 30 MG 24 hr tablet  Commonly known as:  IMDUR  Take 30 mg by mouth daily.     LUMIGAN 0.01 % Soln  Generic drug:  bimatoprost  Place 1 drop into both eyes daily.     potassium chloride SA 20 MEQ tablet  Commonly known as:  K-DUR,KLOR-CON  Take 20 mEq by mouth daily.     pravastatin 40 MG tablet  Commonly known as:  PRAVACHOL  Take 40 mg by mouth daily.     RESTASIS 0.05 % ophthalmic emulsion  Generic drug:  cycloSPORINE  Place 1 drop into both eyes daily.     spironolactone 25 MG tablet  Commonly known as:  ALDACTONE  Take 12.5 mg by mouth daily.     timolol 0.5 % ophthalmic gel-forming  Commonly known as:   TIMOPTIC-XR  Place 1 drop into both eyes daily.       No Known Allergies Follow-up Information    Follow up with Elizabeth Palau, MD On 02/27/2015.   Specialty:  Family Medicine   Why:  Thursday @ 2:00 PM   Contact information:   [place] [address] [phone]       Follow up with Eustace Moore, MD In 1 week.   Specialty:  Neurosurgery   Why:  Left a message for the office to call the patient back to schedule a hospital follow-up appt. Please remind the patient of an appt. on April 9th @ 0845 AM for MRI   Contact information:   [address] [place]  [address] [phone]        The results of significant diagnostics from this hospitalization (including imaging, microbiology, ancillary and laboratory) are listed below for reference.    Significant Diagnostic Studies: Dg Chest 2 View  02/18/2015   CLINICAL DATA:  Left-sided chest pain. Shortness of breath. History of acute bronchitis.  EXAM: CHEST  2 VIEW  COMPARISON:  Chest radiograph earlier today. Chest CT earlier today.  FINDINGS: Cardiac size within normal limits. Calcified tortuous aorta. Patchy LEFT upper lobe opacity better visualized on CT.  There has been previous lower thoracic and lumbar fusion. There is loosening of both screws at the upper aspect of the fusion, although it is unclear if this is T10 or T11.  No pleural effusion or pneumothorax. Mediastinal contours preserved. Acute osseous findings.  IMPRESSION: LEFT upper lobe opacity better visualized on CT. Minimal asymmetry on chest radiograph.  Tortuous aorta with normal heart size.  Loosening of the screws at the upper aspect of the thoracolumbar fusion construct.   Electronically Signed   By: Rolla Flatten M.D.   On: 02/18/2015 15:33   Ct Angio Chest Pe W/cm &/or Wo Cm  02/18/2015   CLINICAL DATA:  Difficulty breathing  EXAM: CT ANGIOGRAPHY CHEST WITH CONTRAST  TECHNIQUE: Multidetector CT imaging of the  chest was performed using the standard protocol during bolus administration of intravenous contrast. Multiplanar CT image reconstructions and MIPs were obtained to evaluate the vascular anatomy.  CONTRAST:  35mL OMNIPAQUE IOHEXOL 350 MG/ML SOLN  COMPARISON:  None.  FINDINGS: The lungs are well aerated bilaterally and demonstrate mild emphysematous changes. Minimal patchy left upper lobe infiltrate is seen. This may represent some early pneumonia.  The thoracic inlet is unremarkable. The thoracic aorta shows calcific changes without aneurysmal dilatation or dissection. The pulmonary artery shows  a normal branching pattern. No filling defects to suggest pulmonary emboli are identified. No hilar or mediastinal adenopathy is seen. Mild coronary calcifications are noted.  The upper abdomen reveals no obstructive change. A 10 mm stone is noted in the upper pole of the right kidney. The remainder the upper abdomen is within normal limits. Postsurgical changes are noted in the lower thoracic spine.  Review of the MIP images confirms the above findings.  IMPRESSION: No evidence of pulmonary emboli.  Patchy left upper lobe infiltrate.  Nonobstructing right renal stone.   Electronically Signed   By: Inez Catalina M.D.   On: 02/18/2015 13:01   Dg Chest Portable 1 View  02/18/2015   CLINICAL DATA:  74 year old male with a history of chest pain.  EXAM: PORTABLE CHEST - 1 VIEW  COMPARISON:  01/19/2015, 05/12/2014  FINDINGS: Cardiomediastinal silhouette within normal limits in size and contour. No evidence of pulmonary vascular congestion.  Low lung volumes. Chronic coarsening of the interstitium, with suggestion of bronchial wall thickening at the lung bases. No confluent airspace disease.  No pleural effusion or pneumothorax.  No displaced fracture.  Surgical changes incompletely visualized of the thoracic spine and left humeral head.  IMPRESSION: Interstitial opacities at the lung bases may reflect chronic or acute on chronic  bronchial inflammation/infection, with no evidence of lobar pneumonia.  Signed,  Dulcy Fanny. Earleen Newport, DO  [facility]  [facility]   Electronically Signed   By: Corrie Mckusick D.O.   On: 02/18/2015 10:01    Microbiology: Recent Results (from the past 240 hour(s))  Culture, blood (routine x 2) Call MD if unable to obtain prior to antibiotics being given     Status: None (Preliminary result)   Collection Time: 02/18/15  6:38 PM  Result Value Ref Range Status   Specimen Description BLOOD LEFT ARM  Final   Special Requests   Final    BOTTLES DRAWN AEROBIC AND ANAEROBIC 10CC AER,5CC ANA   Culture PENDING  Incomplete   Report Status PENDING  Incomplete     Labs: Basic Metabolic Panel:  Recent Labs Lab 02/18/15 0930 02/19/15 0450  NA 139 139  K 3.9 4.2  CL 107 108  CO2 27 27  GLUCOSE 145* 90  BUN 13 14  CREATININE 1.52* 1.41*  CALCIUM 9.4 9.0  MG 1.8  --    Liver Function Tests:  Recent Labs Lab 02/18/15 0930  AST 21  ALT 16  ALKPHOS 64  BILITOT 0.8  PROT 6.7  ALBUMIN 3.6   No results for input(s): LIPASE, AMYLASE in the last 168 hours. No results for input(s): AMMONIA in the last 168 hours. CBC:  Recent Labs Lab 02/18/15 0930  WBC 4.3  HGB 14.3  HCT 42.2  MCV 89.0  PLT 146*   Cardiac Enzymes:  Recent Labs Lab 02/18/15 1843 02/18/15 2217 02/19/15 0450  TROPONINI <0.03 <0.03 <0.03   BNP: BNP (last 3 results) No results for input(s): BNP in the last 8760 hours.  ProBNP (last 3 results) No results for input(s): PROBNP in the last 8760 hours.  CBG: No results for input(s): GLUCAP in the last 168 hours.  Signed:  CHIU, Orpah Melter  [facility] 02/19/2015, 7:29 PM

## 2015-02-22 NOTE — ED Provider Notes (Signed)
CSN: [id number]     Arrival date & time 02/18/15  0908 History   First MD Initiated Contact with Patient 02/18/15 0919     Chief Complaint  Patient presents with  . Chest Pain      HPI Pt reports onset of left chest pain last night. Reports area was swollen and had left arm numbness x 2 hours. Having non productive cough and intermittent right foot swelling.  Past Medical History  Diagnosis Date  . Colon polyp, hyperplastic   . Spondylolisthesis   . Hypertension   . CHF (congestive heart failure)   . Unstable angina 11/22/2011  . Respiratory failure, acute 11/22/2011  . CAD (coronary artery disease) 80% stenosis diag of the LAD, 30% in OM2 branch of LCX in 2009 11/22/2011  . Accelerated hypertension 11/22/2011  . Unstable angina 05/26/2012  . LV dysfunction, hx of EF 20% but most recent Echo 03/14/12 EF 35 -40%, life vest discontinued 05/26/2012  . Nonischemic cardiomyopathy   . Ventricular ectopy Hx of PVCs 11/22/2011  . 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 Myocardial infarction 11/22/11  . Acute bronchitis 05/26/12  . Exertional dyspnea   . Legally blind     "both eyes"  . 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 CKD (chronic kidney disease) stage 3, GFR 30-59 ml/min 05/27/2012  . Acute on chronic systolic CHF (congestive heart failure) 06/05/2012  . History of stress test 06/01/2012    Normal myocardial perfusion study. compared to the previous study there is no significant change. this is a low risk scan  . Peripheral arterial disease   . Pneumonia 02/18/2015   Past Surgical History  Procedure Laterality Date  . Cystoscopy    . Rotator cuff repair  2000's    left  . Back surgery    . Cardiac catheterization  11/2011  . Posterior fusion lumbar spine  1979  . Cataract extraction, bilateral  1990's  . Ep study and ablation of vt  7/13    PVC focus mapped to the right coronary cusp of the aorta, limited ablation performed due  to proximity of the focus to the right coronary artery  . Eye surgery    . Cystoscopy with urethral dilatation N/A 05/04/2013    Procedure: CYSTOSCOPY WITH URETHRAL DILATATION;  Surgeon: Eustace Moore, MD;  Location: [facility];  Service: Neurosurgery;  Laterality: N/A;  with insertion of foley catheter  . Cardiac catheterization  11/2011    didn't demonstrate high grade obstructive disease to account for his LV dysfunction.  . Left heart catheterization with coronary angiogram N/A 11/25/2011    Procedure: LEFT HEART CATHETERIZATION WITH CORONARY ANGIOGRAM;  Surgeon: Leonie Man, MD;  Location: [facility] CATH LAB;  Service: Cardiovascular;  Laterality: N/A;  . V-tach ablation N/A 06/06/2012    Procedure: V-TACH ABLATION;  Surgeon: Thompson Grayer, MD;  Location: [facility] CATH LAB;  Service: Cardiovascular;  Laterality: N/A;   History reviewed. No pertinent family history. History  Substance Use Topics  . Smoking status: Former Smoker -- 50 years    Types: Cigarettes    Quit date: 11/23/1999  . Smokeless tobacco: Never Used  . 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     Allergies  Review of patient's allergies indicates no known  allergies.  Home Medications   Prior to Admission medications   Medication Sig Start Date End Date Taking? Authorizing Provider  acetaminophen (TYLENOL) 325 MG tablet Take 650 mg by mouth every 6 (six) hours as needed for mild pain or headache.   Yes Historical Provider, MD  amLODipine (NORVASC) 10 MG tablet Take 10 mg by mouth daily. 01/22/15  Yes Historical Provider, MD  aspirin 325 MG tablet Take 81 mg by mouth daily.    Yes Historical Provider, MD  furosemide (LASIX) 20 MG tablet Take 20 mg by mouth daily. 01/22/15  Yes Historical Provider, MD  Iron-Vitamins (GERITOL COMPLETE PO) Take 1 tablet by mouth daily.   Yes Historical Provider, MD   isosorbide mononitrate (IMDUR) 30 MG 24 hr tablet Take 30 mg by mouth daily. 02/13/15  Yes Historical Provider, MD  LUMIGAN 0.01 % SOLN Place 1 drop into both eyes daily. 12/06/14  Yes Historical Provider, MD  potassium chloride SA (K-DUR,KLOR-CON) 20 MEQ tablet Take 20 mEq by mouth daily.   Yes Historical Provider, MD  pravastatin (PRAVACHOL) 40 MG tablet Take 40 mg by mouth daily. 02/13/15  Yes Historical Provider, MD  RESTASIS 0.05 % ophthalmic emulsion Place 1 drop into both eyes daily. 12/30/14  Yes Historical Provider, MD  spironolactone (ALDACTONE) 25 MG tablet Take 12.5 mg by mouth daily. 02/01/15  Yes Historical Provider, MD  timolol (TIMOPTIC-XR) 0.5 % ophthalmic gel-forming Place 1 drop into both eyes daily. 12/10/14  Yes Historical Provider, MD  azithromycin (ZITHROMAX) 500 MG tablet Take 1 tablet (500 mg total) by mouth daily. 02/19/15   Donne Hazel, MD  cefUROXime (CEFTIN) 250 MG tablet Take 1 tablet (250 mg total) by mouth 2 (two) times daily with a meal. 02/19/15   Donne Hazel, MD  HYDROcodone-acetaminophen (NORCO/VICODIN) 5-325 MG per tablet Take 1-2 tablets by mouth every 4 (four) hours as needed for moderate pain. 02/19/15   Donne Hazel, MD   BP 127/65 mmHg  Pulse 52  Temp(Src) 97.7 F (36.5 C) (Oral)  Resp 18  Ht 5\' 8"  (1.727 m)  Wt 166 lb 9.6 oz (75.569 kg)  BMI 25.34 kg/m2  SpO2 96% Physical Exam  Constitutional: He is oriented to person, place, and time. He appears well-developed and well-nourished. No distress.  HENT:  Head: Normocephalic and atraumatic.  Eyes: Pupils are equal, round, and reactive to light.  Neck: Normal range of motion.  Cardiovascular: Normal rate and intact distal pulses.   Pulmonary/Chest: No respiratory distress. He has wheezes. He has no rales. He exhibits no tenderness.  Abdominal: Normal appearance. He exhibits no distension.  Musculoskeletal: Normal range of motion.  Neurological: He is alert and oriented to person, place, and time. No  cranial nerve deficit.  Skin: Skin is warm and dry. No rash noted.  Psychiatric: He has a normal mood and affect. His behavior is normal.  Nursing note and vitals reviewed.   ED Course  Procedures (including critical care time) Labs Review Labs Reviewed  CBC - Abnormal; Notable for the following:    Platelets 146 (*)    All other components within normal limits  COMPREHENSIVE METABOLIC PANEL - Abnormal; Notable for the following:    Glucose, Bld 145 (*)    Creatinine, Ser 1.52 (*)    GFR calc non Af Amer 43 (*)    GFR calc Af Amer 50 (*)    All other components within normal limits  PROTIME-INR - Abnormal; Notable for the following:    Prothrombin  Time 15.4 (*)    All other components within normal limits  BASIC METABOLIC PANEL - Abnormal; Notable for the following:    Creatinine, Ser 1.41 (*)    GFR calc non Af Amer 47 (*)    GFR calc Af Amer 54 (*)    Anion gap 4 (*)    All other components within normal limits  CULTURE, BLOOD (ROUTINE X 2)  CULTURE, BLOOD (ROUTINE X 2)  MAGNESIUM  APTT  HIV ANTIBODY (ROUTINE TESTING)  LEGIONELLA ANTIGEN, URINE  STREP PNEUMONIAE URINARY ANTIGEN  TROPONIN I  TROPONIN I  TROPONIN I  I-STAT TROPOININ, ED    Imaging Review  CLINICAL DATA: Difficulty breathing  EXAM: CT ANGIOGRAPHY CHEST WITH CONTRAST  TECHNIQUE: Multidetector CT imaging of the chest was performed using the standard protocol during bolus administration of intravenous contrast. Multiplanar CT image reconstructions and MIPs were obtained to evaluate the vascular anatomy.  CONTRAST: 17mL OMNIPAQUE IOHEXOL 350 MG/ML SOLN  COMPARISON: None.  FINDINGS: The lungs are well aerated bilaterally and demonstrate mild emphysematous changes. Minimal patchy left upper lobe infiltrate is seen. This may represent some early pneumonia.  The thoracic inlet is unremarkable. The thoracic aorta shows calcific changes without aneurysmal dilatation or dissection.  The pulmonary artery shows a normal branching pattern. No filling defects to suggest pulmonary emboli are identified. No hilar or mediastinal adenopathy is seen. Mild coronary calcifications are noted.  The upper abdomen reveals no obstructive change. A 10 mm stone is noted in the upper pole of the right kidney. The remainder the upper abdomen is within normal limits. Postsurgical changes are noted in the lower thoracic spine.  Review of the MIP images confirms the above findings.  IMPRESSION: No evidence of pulmonary emboli.  Patchy left upper lobe infiltrate.  Nonobstructing right renal stone.   Electronically Signed By: Inez Catalina M.D. On: 02/18/2015 13:01  EKG Interpretation   Date/Time:  Tuesday February 18 2015 09:12:37 EDT Ventricular Rate:  64 PR Interval:  362 QRS Duration: 114 QT Interval:  428 QTC Calculation: 441 R Axis:   47 Text Interpretation:  Sinus rhythm with marked sinus arrhythmia with 1st  degree A-V block Nonspecific ST and T wave abnormality Abnormal ECG  Confirmed by Nycholas Rayner  MD, [place] ([id number]) on 02/18/2015 9:20:35 AM      MDM   Final diagnoses:  Chest pain  CAP (community acquired pneumonia)        Leonard Schwartz, MD 02/22/15 1606

## 2015-02-25 LAB — CULTURE, BLOOD (ROUTINE X 2)
CULTURE: NO GROWTH
Culture: NO GROWTH

## 2015-02-26 ENCOUNTER — Telehealth: Payer: Self-pay | Admitting: Cardiovascular Disease

## 2015-02-26 MED ORDER — NITROGLYCERIN 0.4 MG SL SUBL: 0.4000 mg | SUBLINGUAL_TABLET | SUBLINGUAL | Status: DC | PRN

## 2015-02-26 NOTE — Telephone Encounter (Signed)
E-SENT MEDICATION TO PHARMACY #25 TABLET  WITH 3 REFILLS

## 2015-02-26 NOTE — Telephone Encounter (Signed)
°  1. Which medications need to be refilled? Nitroglycerin  2. Which pharmacy is medication to be sent to? [phone]  3. Do they need a 30 day or 90 day supply? 30  4. Would they like a call back once the medication has been sent to the pharmacy? no

## 2015-03-12 ENCOUNTER — Ambulatory Visit (INDEPENDENT_AMBULATORY_CARE_PROVIDER_SITE_OTHER): Payer: Medicare Other | Admitting: Podiatry

## 2015-03-12 ENCOUNTER — Encounter: Payer: Self-pay | Admitting: Podiatry

## 2015-03-12 DIAGNOSIS — M79676 Pain in unspecified toe(s): Secondary | ICD-10-CM

## 2015-03-12 DIAGNOSIS — B351 Tinea unguium: Secondary | ICD-10-CM

## 2015-03-13 NOTE — Progress Notes (Signed)
Patient ID: [name], male   DOB: Jan 17, 1941, 74 y.o.   MRN: [id number]  Subjective: Patient presents today complaining of painful toenails and requests toenail debridement. On the visit of 07/08/2014 decrease pedal pulses were noted and patient is referred for lower extremity arterial Doppler. The results of the Doppler demonstrated  posterior tibial disease on the right and follow-up was provided by Dr. Quay Burow for the right posterior tibial disease. No active treatment was recommended at that time  Objective: The toenails are elongated, brittle, discolored, incurvated and tender to direct palpation 6-10  Assessment: Symptomatic onychomycosis 6-10 Peripheral arterial disease evaluated by Dr. Gwenlyn Found  Plan: Debridement of toenails 10 without any bleeding  Reappoint at patient's request

## 2015-04-09 ENCOUNTER — Observation Stay (HOSPITAL_COMMUNITY)
Admission: EM | Admit: 2015-04-09 | Discharge: 2015-04-11 | Disposition: A | Discharge status: Home / Self Care | Payer: Medicare Other | Attending: Interventional Cardiology | Admitting: Interventional Cardiology

## 2015-04-09 ENCOUNTER — Emergency Department (HOSPITAL_COMMUNITY): Payer: Medicare Other

## 2015-04-09 ENCOUNTER — Encounter (HOSPITAL_COMMUNITY): Payer: Self-pay | Admitting: Family Medicine

## 2015-04-09 DIAGNOSIS — I252 Old myocardial infarction: Secondary | ICD-10-CM | POA: Diagnosis not present

## 2015-04-09 DIAGNOSIS — H548 Legal blindness, as defined in USA: Secondary | ICD-10-CM | POA: Insufficient documentation

## 2015-04-09 DIAGNOSIS — I440 Atrioventricular block, first degree: Secondary | ICD-10-CM | POA: Diagnosis present

## 2015-04-09 DIAGNOSIS — I493 Ventricular premature depolarization: Secondary | ICD-10-CM | POA: Diagnosis not present

## 2015-04-09 DIAGNOSIS — I2510 Atherosclerotic heart disease of native coronary artery without angina pectoris: Secondary | ICD-10-CM | POA: Diagnosis not present

## 2015-04-09 DIAGNOSIS — I739 Peripheral vascular disease, unspecified: Secondary | ICD-10-CM | POA: Insufficient documentation

## 2015-04-09 DIAGNOSIS — I472 Ventricular tachycardia: Secondary | ICD-10-CM | POA: Insufficient documentation

## 2015-04-09 DIAGNOSIS — Z7982 Long term (current) use of aspirin: Secondary | ICD-10-CM | POA: Insufficient documentation

## 2015-04-09 DIAGNOSIS — R42 Dizziness and giddiness: Secondary | ICD-10-CM | POA: Insufficient documentation

## 2015-04-09 DIAGNOSIS — I5022 Chronic systolic (congestive) heart failure: Secondary | ICD-10-CM | POA: Diagnosis not present

## 2015-04-09 DIAGNOSIS — K635 Polyp of colon: Secondary | ICD-10-CM | POA: Diagnosis not present

## 2015-04-09 DIAGNOSIS — E785 Hyperlipidemia, unspecified: Secondary | ICD-10-CM | POA: Diagnosis present

## 2015-04-09 DIAGNOSIS — I25119 Atherosclerotic heart disease of native coronary artery with unspecified angina pectoris: Secondary | ICD-10-CM | POA: Diagnosis not present

## 2015-04-09 DIAGNOSIS — R0602 Shortness of breath: Secondary | ICD-10-CM | POA: Insufficient documentation

## 2015-04-09 DIAGNOSIS — I429 Cardiomyopathy, unspecified: Secondary | ICD-10-CM | POA: Diagnosis not present

## 2015-04-09 DIAGNOSIS — R079 Chest pain, unspecified: Principal | ICD-10-CM | POA: Diagnosis present

## 2015-04-09 DIAGNOSIS — Z87891 Personal history of nicotine dependence: Secondary | ICD-10-CM | POA: Diagnosis not present

## 2015-04-09 DIAGNOSIS — M4310 Spondylolisthesis, site unspecified: Secondary | ICD-10-CM | POA: Diagnosis not present

## 2015-04-09 DIAGNOSIS — R05 Cough: Secondary | ICD-10-CM | POA: Insufficient documentation

## 2015-04-09 DIAGNOSIS — N183 Chronic kidney disease, stage 3 unspecified: Secondary | ICD-10-CM | POA: Diagnosis present

## 2015-04-09 DIAGNOSIS — I441 Atrioventricular block, second degree: Secondary | ICD-10-CM | POA: Diagnosis not present

## 2015-04-09 DIAGNOSIS — I499 Cardiac arrhythmia, unspecified: Secondary | ICD-10-CM | POA: Diagnosis not present

## 2015-04-09 DIAGNOSIS — Z79899 Other long term (current) drug therapy: Secondary | ICD-10-CM | POA: Diagnosis not present

## 2015-04-09 DIAGNOSIS — R61 Generalized hyperhidrosis: Secondary | ICD-10-CM | POA: Insufficient documentation

## 2015-04-09 DIAGNOSIS — N1832 Chronic kidney disease, stage 3b: Secondary | ICD-10-CM | POA: Diagnosis present

## 2015-04-09 HISTORY — DX: Cardiac arrhythmia, unspecified: I49.9

## 2015-04-09 HISTORY — DX: Other cardiomyopathies: I42.8

## 2015-04-09 HISTORY — DX: Chronic systolic (congestive) heart failure: I50.22

## 2015-04-09 HISTORY — DX: Other specified cardiac arrhythmias: I49.8

## 2015-04-09 HISTORY — DX: Ventricular premature depolarization: I49.3

## 2015-04-09 LAB — CBC
HEMATOCRIT: 40.3 % (ref 39.0–52.0)
Hemoglobin: 14.2 g/dL (ref 13.0–17.0)
MCH: 30.7 pg (ref 26.0–34.0)
MCHC: 35.2 g/dL (ref 30.0–36.0)
MCV: 87 fL (ref 78.0–100.0)
Platelets: 150 10*3/uL (ref 150–400)
RBC: 4.63 MIL/uL (ref 4.22–5.81)
RDW: 12.7 % (ref 11.5–15.5)
WBC: 4.7 10*3/uL (ref 4.0–10.5)

## 2015-04-09 LAB — BASIC METABOLIC PANEL
ANION GAP: 6 (ref 5–15)
BUN: 15 mg/dL (ref 6–20)
CHLORIDE: 107 mmol/L (ref 101–111)
CO2: 27 mmol/L (ref 22–32)
Calcium: 9.6 mg/dL (ref 8.9–10.3)
Creatinine, Ser: 1.45 mg/dL — ABNORMAL HIGH (ref 0.61–1.24)
GFR calc Af Amer: 52 mL/min — ABNORMAL LOW (ref >60)
GFR calc non Af Amer: 45 mL/min — ABNORMAL LOW (ref >60)
Glucose, Bld: 108 mg/dL — ABNORMAL HIGH (ref 65–99)
Potassium: 4.1 mmol/L (ref 3.5–5.1)
SODIUM: 140 mmol/L (ref 135–145)

## 2015-04-09 LAB — TSH: TSH: 1.34 u[IU]/mL (ref 0.350–4.500)

## 2015-04-09 LAB — I-STAT TROPONIN, ED: Troponin i, poc: 0 ng/mL (ref 0.00–0.08)

## 2015-04-09 LAB — TROPONIN I: Troponin I: <0.03 ng/mL (ref <0.031)

## 2015-04-09 LAB — BRAIN NATRIURETIC PEPTIDE: B Natriuretic Peptide: 49.9 pg/mL (ref 0.0–100.0)

## 2015-04-09 LAB — MAGNESIUM: Magnesium: 1.7 mg/dL (ref 1.7–2.4)

## 2015-04-09 LAB — T4, FREE: Free T4: 0.83 ng/dL (ref 0.61–1.12)

## 2015-04-09 MED ORDER — TIMOLOL MALEATE 0.5 % OP SOLG
1.0000 [drp] | Freq: Every day | OPHTHALMIC | Status: DC
  Administered 2015-04-10 – 2015-04-11 (×2): 1 [drp] via OPHTHALMIC
  Filled 2015-04-09: qty 5

## 2015-04-09 MED ORDER — ASPIRIN EC 81 MG PO TBEC
81.0000 mg | DELAYED_RELEASE_TABLET | Freq: Every day | ORAL | Status: DC
  Administered 2015-04-10 – 2015-04-11 (×2): 81 mg via ORAL
  Filled 2015-04-09 (×2): qty 1

## 2015-04-09 MED ORDER — ONDANSETRON HCL 4 MG/2ML IJ SOLN: 4.0000 mg | Freq: Four times a day (QID) | INTRAMUSCULAR | Status: DC | PRN

## 2015-04-09 MED ORDER — PRAVASTATIN SODIUM 40 MG PO TABS
40.0000 mg | ORAL_TABLET | Freq: Every day | ORAL | Status: DC
  Administered 2015-04-10 – 2015-04-11 (×2): 40 mg via ORAL
  Filled 2015-04-09 (×2): qty 1

## 2015-04-09 MED ORDER — ISOSORBIDE MONONITRATE ER 30 MG PO TB24
30.0000 mg | ORAL_TABLET | Freq: Every day | ORAL | Status: DC
  Administered 2015-04-10 – 2015-04-11 (×2): 30 mg via ORAL
  Filled 2015-04-09 (×2): qty 1

## 2015-04-09 MED ORDER — NITROGLYCERIN 0.4 MG SL SUBL
0.4000 mg | SUBLINGUAL_TABLET | SUBLINGUAL | Status: DC | PRN
  Administered 2015-04-09 – 2015-04-10 (×8): 0.4 mg via SUBLINGUAL
  Filled 2015-04-09 (×4): qty 1

## 2015-04-09 MED ORDER — SODIUM CHLORIDE 0.9 % IJ SOLN: 3.0000 mL | INTRAMUSCULAR | Status: DC | PRN

## 2015-04-09 MED ORDER — AMLODIPINE BESYLATE 10 MG PO TABS
10.0000 mg | ORAL_TABLET | Freq: Every day | ORAL | Status: DC
  Administered 2015-04-10 – 2015-04-11 (×2): 10 mg via ORAL
  Filled 2015-04-09 (×2): qty 1

## 2015-04-09 MED ORDER — SODIUM CHLORIDE 0.9 % IJ SOLN
3.0000 mL | Freq: Two times a day (BID) | INTRAMUSCULAR | Status: DC
  Administered 2015-04-09 – 2015-04-10 (×3): 3 mL via INTRAVENOUS

## 2015-04-09 MED ORDER — HEPARIN SODIUM (PORCINE) 5000 UNIT/ML IJ SOLN
5000.0000 [IU] | Freq: Three times a day (TID) | INTRAMUSCULAR | Status: DC
  Administered 2015-04-09 – 2015-04-11 (×5): 5000 [IU] via SUBCUTANEOUS
  Filled 2015-04-09 (×5): qty 1

## 2015-04-09 MED ORDER — SPIRONOLACTONE 25 MG PO TABS
12.5000 mg | ORAL_TABLET | Freq: Every day | ORAL | Status: DC
  Administered 2015-04-10 – 2015-04-11 (×2): 12.5 mg via ORAL
  Filled 2015-04-09 (×2): qty 1

## 2015-04-09 MED ORDER — CYCLOSPORINE 0.05 % OP EMUL
1.0000 [drp] | Freq: Every day | OPHTHALMIC | Status: DC
  Administered 2015-04-10 – 2015-04-11 (×2): 1 [drp] via OPHTHALMIC
  Filled 2015-04-09 (×2): qty 1

## 2015-04-09 MED ORDER — HYDROCODONE-ACETAMINOPHEN 5-325 MG PO TABS: 1.0000 | ORAL_TABLET | ORAL | Status: DC | PRN

## 2015-04-09 MED ORDER — ASPIRIN 81 MG PO CHEW
324.0000 mg | CHEWABLE_TABLET | Freq: Once | ORAL | Status: AC
  Administered 2015-04-09: 324 mg via ORAL
  Filled 2015-04-09: qty 4

## 2015-04-09 MED ORDER — POTASSIUM CHLORIDE CRYS ER 20 MEQ PO TBCR
20.0000 meq | EXTENDED_RELEASE_TABLET | Freq: Every day | ORAL | Status: DC
  Administered 2015-04-10 – 2015-04-11 (×2): 20 meq via ORAL
  Filled 2015-04-09 (×2): qty 1

## 2015-04-09 MED ORDER — SODIUM CHLORIDE 0.9 % IV SOLN: 250.0000 mL | INTRAVENOUS | Status: DC | PRN

## 2015-04-09 MED ORDER — GERITOL COMPLETE PO TABS: 1.0000 | ORAL_TABLET | Freq: Every day | ORAL | Status: DC

## 2015-04-09 MED ORDER — FUROSEMIDE 20 MG PO TABS
20.0000 mg | ORAL_TABLET | Freq: Every day | ORAL | Status: DC
Start: 2015-04-10 — End: 2015-04-11
  Administered 2015-04-10 – 2015-04-11 (×2): 20 mg via ORAL
  Filled 2015-04-09 (×2): qty 1

## 2015-04-09 MED ORDER — ACETAMINOPHEN 325 MG PO TABS
650.0000 mg | ORAL_TABLET | Freq: Four times a day (QID) | ORAL | Status: DC | PRN
  Administered 2015-04-10: 650 mg via ORAL
  Filled 2015-04-09 (×3): qty 2

## 2015-04-09 MED ORDER — LATANOPROST 0.005 % OP SOLN
1.0000 [drp] | Freq: Every day | OPHTHALMIC | Status: DC
  Administered 2015-04-10 – 2015-04-11 (×2): 1 [drp] via OPHTHALMIC
  Filled 2015-04-09: qty 2.5

## 2015-04-09 NOTE — ED Notes (Signed)
Pt sts about 5 am this morning severe chest pain. sts the chest pain has been going on for a while. Denies cough. sts some SOB.

## 2015-04-09 NOTE — ED Notes (Signed)
Pt remains monitored by blood pressure, pulse ox, and 5 lead. Assisted Matt with getting pts weight. Pt was able to stand at bedside with no assistance needed.

## 2015-04-09 NOTE — ED Notes (Signed)
Pt remains monitored by blood pressure, pulse ox, and 12 lead. Pts family remains at bedside.  

## 2015-04-09 NOTE — ED Provider Notes (Signed)
CSN: [id number]     Arrival date & time 04/09/15  1204 History   First MD Initiated Contact with Patient 04/09/15 1328     Chief Complaint  Patient presents with  . Chest Pain     (Consider location/radiation/quality/duration/timing/severity/associated sxs/prior Treatment) Patient is a 74 y.o. male presenting with chest pain.  Chest Pain Pain location:  Substernal area Pain quality: tightness   Pain radiates to:  Does not radiate Pain severity:  Severe Onset quality:  Sudden Duration: several hours. Timing: intermittent at first, now constant. Progression:  Worsening Chronicity:  Recurrent (similar pains for past month, getting worse) Context: at rest   Relieved by:  Nothing Worsened by:  Nothing tried Associated symptoms: cough ("A little, not much"), diaphoresis, dizziness and shortness of breath   Associated symptoms: no fever, no nausea and not vomiting     Past Medical History  Diagnosis Date  . Colon polyp, hyperplastic   . Spondylolisthesis   . Hypertension   . CHF (congestive heart failure)   . Unstable angina 11/22/2011  . Respiratory failure, acute 11/22/2011  . CAD (coronary artery disease) 80% stenosis diag of the LAD, 30% in OM2 branch of LCX in 2009 11/22/2011  . Accelerated hypertension 11/22/2011  . Unstable angina 05/26/2012  . LV dysfunction, hx of EF 20% but most recent Echo 03/14/12 EF 35 -40%, life vest discontinued 05/26/2012  . Nonischemic cardiomyopathy   . Ventricular ectopy Hx of PVCs 11/22/2011  . 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 Myocardial infarction 11/22/11  . Acute bronchitis 05/26/12  . Exertional dyspnea   . Legally blind     "both eyes"  . 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 CKD (chronic kidney disease) stage 3, GFR 30-59 ml/min 05/27/2012  . Acute on chronic systolic CHF (congestive heart failure) 06/05/2012  . History of stress test 06/01/2012    Normal myocardial perfusion study.  compared to the previous study there is no significant change. this is a low risk scan  . Peripheral arterial disease   . Pneumonia 02/18/2015   Past Surgical History  Procedure Laterality Date  . Cystoscopy    . Rotator cuff repair  2000's    left  . Back surgery    . Cardiac catheterization  11/2011  . Posterior fusion lumbar spine  1979  . Cataract extraction, bilateral  1990's  . Ep study and ablation of vt  7/13    PVC focus mapped to the right coronary cusp of the aorta, limited ablation performed due to proximity of the focus to the right coronary artery  . Eye surgery    . Cystoscopy with urethral dilatation N/A 05/04/2013    Procedure: CYSTOSCOPY WITH URETHRAL DILATATION;  Surgeon: Eustace Moore, MD;  Location: [place] [facility];  Service: Neurosurgery;  Laterality: N/A;  with insertion of foley catheter  . Cardiac catheterization  11/2011    didn't demonstrate high grade obstructive disease to account for his LV dysfunction.  . Left heart catheterization with coronary angiogram N/A 11/25/2011    Procedure: LEFT HEART CATHETERIZATION WITH CORONARY ANGIOGRAM;  Surgeon: Leonie Man, MD;  Location: [facility] CATH LAB;  Service: Cardiovascular;  Laterality: N/A;  . V-tach ablation N/A 06/06/2012    Procedure: V-TACH ABLATION;  Surgeon: Thompson Grayer, MD;  Location: [facility] CATH LAB;  Service: Cardiovascular;  Laterality: N/A;   History reviewed. No pertinent family history. History  Substance Use Topics  .  Smoking status: Former Smoker -- 50 years    Types: Cigarettes    Quit date: 11/23/1999  . Smokeless tobacco: Never Used  . Alcohol Use: No     Comment: 05/26/12 "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; stopped drinking in the early 1980's"    Review of Systems  Constitutional: Positive for diaphoresis. Negative for fever.  Respiratory: Positive for cough ("A little, not much") and shortness of breath.   Cardiovascular: Positive for chest pain.  Gastrointestinal: Negative for nausea and vomiting.   Neurological: Positive for dizziness.  All other systems reviewed and are negative.     Allergies  Review of patient's allergies indicates no known allergies.  Home Medications   Prior to Admission medications   Medication Sig Start Date End Date Taking? Authorizing Provider  acetaminophen (TYLENOL) 325 MG tablet Take 650 mg by mouth every 6 (six) hours as needed for mild pain or headache.    Historical Provider, MD  amLODipine (NORVASC) 10 MG tablet Take 10 mg by mouth daily. 01/22/15   Historical Provider, MD  aspirin 325 MG tablet Take 81 mg by mouth daily.     Historical Provider, MD  azithromycin (ZITHROMAX) 500 MG tablet Take 1 tablet (500 mg total) by mouth daily. 02/19/15   Donne Hazel, MD  cefUROXime (CEFTIN) 250 MG tablet Take 1 tablet (250 mg total) by mouth 2 (two) times daily with a meal. 02/19/15   Donne Hazel, MD  furosemide (LASIX) 20 MG tablet Take 20 mg by mouth daily. 01/22/15   Historical Provider, MD  HYDROcodone-acetaminophen (NORCO/VICODIN) 5-325 MG per tablet Take 1-2 tablets by mouth every 4 (four) hours as needed for moderate pain. 02/19/15   Donne Hazel, MD  Iron-Vitamins (GERITOL COMPLETE PO) Take 1 tablet by mouth daily.    Historical Provider, MD  isosorbide mononitrate (IMDUR) 30 MG 24 hr tablet Take 30 mg by mouth daily. 02/13/15   Historical Provider, MD  LUMIGAN 0.01 % SOLN Place 1 drop into both eyes daily. 12/06/14   Historical Provider, MD  nitroGLYCERIN (NITROSTAT) 0.4 MG SL tablet Place 1 tablet (0.4 mg total) under the tongue every 5 (five) minutes as needed for chest pain. 02/26/15   Troy Sine, MD  potassium chloride SA (K-DUR,KLOR-CON) 20 MEQ tablet Take 20 mEq by mouth daily.    Historical Provider, MD  pravastatin (PRAVACHOL) 40 MG tablet Take 40 mg by mouth daily. 02/13/15   Historical Provider, MD  RESTASIS 0.05 % ophthalmic emulsion Place 1 drop into both eyes daily. 12/30/14   Historical Provider, MD  spironolactone (ALDACTONE) 25 MG tablet  Take 12.5 mg by mouth daily. 02/01/15   Historical Provider, MD  timolol (TIMOPTIC-XR) 0.5 % ophthalmic gel-forming Place 1 drop into both eyes daily. 12/10/14   Historical Provider, MD   BP 138/80 mmHg  Pulse 62  Temp(Src) 98 F (36.7 C) (Oral)  Resp 20  SpO2 100% Physical Exam  Constitutional: He is oriented to person, place, and time. He appears well-developed and well-nourished. No distress.  HENT:  Head: Normocephalic and atraumatic.  Mouth/Throat: Oropharynx is clear and moist.  Eyes: Conjunctivae are normal. Pupils are equal, round, and reactive to light. No scleral icterus.  Neck: Neck supple.  Cardiovascular: Normal rate, regular rhythm, normal heart sounds and intact distal pulses.   No murmur heard. Pulmonary/Chest: Effort normal and breath sounds normal. No stridor. No respiratory distress. He has no wheezes. He has no rales.  Abdominal: Soft. He exhibits no distension. There is  no tenderness.  Musculoskeletal: Normal range of motion. He exhibits no edema.  Neurological: He is alert and oriented to person, place, and time.  Skin: Skin is warm and dry. No rash noted.  Psychiatric: He has a normal mood and affect. His behavior is normal.  Nursing note and vitals reviewed.   ED Course  Procedures (including critical care time) Labs Review Labs Reviewed  BASIC METABOLIC PANEL - Abnormal; Notable for the following:    Glucose, Bld 108 (*)    Creatinine, Ser 1.45 (*)    GFR calc non Af Amer 45 (*)    GFR calc Af Amer 52 (*)    All other components within normal limits  CBC  BRAIN NATRIURETIC PEPTIDE  I-STAT TROPOININ, ED    Imaging Review Dg Chest 2 View  04/09/2015   CLINICAL DATA:  Chest pain starting 5 a.m.  EXAM: CHEST  2 VIEW  COMPARISON:  02/18/2015  FINDINGS: Cardiomediastinal silhouette is stable. No acute infiltrate or pleural effusion. No pulmonary edema. Again noted metallic fixation rods lower thoracic and lumbar spine.  IMPRESSION: No active  cardiopulmonary disease.   Electronically Signed   By: Lahoma Crocker M.D.   On: 04/09/2015 13:11  All radiology studies independently viewed by me.      EKG Interpretation   Date/Time:  Wednesday Apr 09 2015 12:09:28 EDT Ventricular Rate:  58 PR Interval:    QRS Duration: 110 QT Interval:  426 QTC Calculation: 418 R Axis:   22 Text Interpretation:  Sinus rhythm with first degree AV block Incomplete  left bundle branch block Nonspecific ST and T wave abnormality Abnormal  ECG No significant change was found Confirmed by [facility]  MD, TREY ([id number])  on 04/09/2015 2:15:44 PM      MDM   Final diagnoses:  Chest pain    74 yo male with hx of CAD presenting with CP.  Has had some similar symptoms for past few weeks, but it is worse today.  Given Aspirin 324mg .  Have consulted cardiology for evaluation.    Serita Grit, MD 04/09/15 1539

## 2015-04-09 NOTE — H&P (Signed)
History and Physical  Patient ID: [name] MRN: [id number], DOB: 1941-09-14 Date of Encounter: 04/09/2015, 3:47 PM Primary Physician: Elizabeth Palau, MD Primary Cardiologist: Dr. Claiborne Billings  Chief Complaint: chest pain Reason for Admission: chest pain  HPI: 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 His cardiomyopathy was previously thought in part due to his PVCs. He underwent ablation in 2013 although ablation therapy was limited as the PVC focus was found to arise from the right coronary cusp of the aorta near the right coronary artery. He was on amiodarone for a while after and this was eventually tapered to discontinuation after PVCs burden improved.  He presents today with left-sided chest pain that began 2-3 weeks ago. He was admitted with PNA in 01/2015. He initially thought the chest discomfort he developed was due to this coming back. He describes it as a tightness/heaviness that is intermittent. It lasts anywhere from 5-10 minutes up until an hour. There is no rhyme or reason when it occurs - it can occur when he is at rest or exertion. However, it does not get worse with exertion. He exercises 3x a week or more doing 20 minutes on his exercise bike and "50 bend-overs" four times a day (with each meal and before bed). He was able to exercise last night without any limitation. However, shortly after he stopped, he began to feel "funny" - he has a hard time describing this but just generally felt strange, like his heart was doing different things. Over the last 2-3 weeks, the incidence and severity of the pain has not changed. At worst, he rates the pain 5/10 in severity. He has mild intermittent dyspnea that is unchanged. He denies light-headedness, weakness, syncope, and palpitations. He  endorses mild abdominal distention but no recent changes in appetite, nausea/vomiting, LEE, or orthopnea. CXR is nonacute. Troponin neg x1. BNP wnl. CBC normal, BMET OK except for stable Cr elevation of 1.45.  In the ER he is noted to have NSR with sinus bradycardia and periodic Type 1 2nd degree AVB associated with 2-2.2 second pauses, PVCs, and occasional ventricular bigeminy. During one episode of pauses while the monitor was beeping, he did note increased discomfort transiently. He now currently complains that the area feels sore - particularly upon palpation. He reports the palpation DOES elicit the same chest pain that he presented with. He also notes his left breast area has a tendency to be swollen in the morning. This is not present on exam today.  Past Medical History  Diagnosis Date  . Colon polyp, hyperplastic   . Spondylolisthesis   . Hypertension   . Chronic systolic CHF (congestive heart failure)   . NICM (nonischemic cardiomyopathy)     a. Remote hx of dilated NICM with EF ranging 20-45%, including normal EF by echo (55-60%) in 2014.  Marland Kitchen CAD (coronary artery disease) 80% stenosis diag of the LAD, 30% in OM2 branch of LCX in 2009     a. Nonobstructive CAD by cath 11/2011 with the exception of the pre-existing diagonal branch #2 lesion.  Marland Kitchen PVC's (premature ventricular contractions)   . 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 Requiring discontinuation of BB dose.  . Myocardial infarction 11/22/11  . Legally blind     "  both eyes"  . CKD (chronic kidney disease) stage 3, GFR 30-59 ml/min   . History of stress test 06/01/2012    Normal myocardial perfusion study. compared to the previous study there is no significant change. this is a low risk scan  . Peripheral arterial disease     a. 06/2014: ABI right 0.99, left 1.2, LE dopplers revealing an occluded right posterior tibial. As symptoms were not felt r/t claudication, no further w/u at the time.  . Ventricular bigeminy       Most Recent Cardiac Studies: 2D echo 01/30/13 - Left ventricle: The cavity size was normal. Systolic function was normal. The estimated ejection fraction was in the range of 55% to 60%. Wall motion was normal; there were no regional wall motion abnormalities. Left ventricular diastolic function parameters were normal. - Aortic valve: Mild focal calcification involving the noncoronary cusp. - Mitral valve: Valve area by pressure half-time: 1.85cm^2. Impressions:  - Compared to April 2013, there is marked improvement in left ventricular function.   Surgical History:  Past Surgical History  Procedure Laterality Date  . Cystoscopy    . Rotator cuff repair  2000's    left  . Back surgery    . Cardiac catheterization  11/2011  . Posterior fusion lumbar spine  1979  . Cataract extraction, bilateral  1990's  . Ep study and ablation of vt  7/13    PVC focus mapped to the right coronary cusp of the aorta, limited ablation performed due to proximity of the focus to the right coronary artery  . Eye surgery    . Cystoscopy with urethral dilatation N/A 05/04/2013    Procedure: CYSTOSCOPY WITH URETHRAL DILATATION;  Surgeon: Eustace Moore, MD;  Location: [place] [facility];  Service: Neurosurgery;  Laterality: N/A;  with insertion of foley catheter  . Cardiac catheterization  11/2011    didn't demonstrate high grade obstructive disease to account for his LV dysfunction.  . Left heart catheterization with coronary angiogram N/A 11/25/2011    Procedure: LEFT HEART CATHETERIZATION WITH CORONARY ANGIOGRAM;  Surgeon: Leonie Man, MD;  Location: [facility] CATH LAB;  Service: Cardiovascular;  Laterality: N/A;  . V-tach ablation N/A 06/06/2012    Procedure: V-TACH ABLATION;  Surgeon: Thompson Grayer, MD;  Location: [facility] CATH LAB;  Service: Cardiovascular;  Laterality: N/A;     Home Meds: Prior to Admission medications   Medication Sig Start Date End Date Taking? Authorizing Provider  acetaminophen  (TYLENOL) 325 MG tablet Take 650 mg by mouth every 6 (six) hours as needed for mild pain or headache.   Yes Historical Provider, MD  amLODipine (NORVASC) 10 MG tablet Take 10 mg by mouth daily. 01/22/15  Yes Historical Provider, MD  aspirin 325 MG tablet Take 81 mg by mouth daily.    Yes Historical Provider, MD  furosemide (LASIX) 20 MG tablet Take 20 mg by mouth daily. 01/22/15  Yes Historical Provider, MD  HYDROcodone-acetaminophen (NORCO/VICODIN) 5-325 MG per tablet Take 1-2 tablets by mouth every 4 (four) hours as needed for moderate pain. 02/19/15  Yes Donne Hazel, MD  Iron-Vitamins (GERITOL COMPLETE PO) Take 1 tablet by mouth daily.   Yes Historical Provider, MD  isosorbide mononitrate (IMDUR) 30 MG 24 hr tablet Take 30 mg by mouth daily. 02/13/15  Yes Historical Provider, MD  LUMIGAN 0.01 % SOLN Place 1 drop into both eyes daily. 12/06/14  Yes Historical Provider, MD  potassium chloride SA (K-DUR,KLOR-CON) 20 MEQ tablet Take 20 mEq by mouth daily.  Yes Historical Provider, MD  pravastatin (PRAVACHOL) 40 MG tablet Take 40 mg by mouth daily. 02/13/15  Yes Historical Provider, MD  RESTASIS 0.05 % ophthalmic emulsion Place 1 drop into both eyes daily. 12/30/14  Yes Historical Provider, MD  spironolactone (ALDACTONE) 25 MG tablet Take 12.5 mg by mouth daily. 02/01/15  Yes Historical Provider, MD  timolol (TIMOPTIC-XR) 0.5 % ophthalmic gel-forming Place 1 drop into both eyes daily. 12/10/14  Yes Historical Provider, MD  azithromycin (ZITHROMAX) 500 MG tablet Take 1 tablet (500 mg total) by mouth daily. Patient not taking: Reported on 04/09/2015 02/19/15   Donne Hazel, MD  cefUROXime (CEFTIN) 250 MG tablet Take 1 tablet (250 mg total) by mouth 2 (two) times daily with a meal. Patient not taking: Reported on 04/09/2015 02/19/15   Donne Hazel, MD  nitroGLYCERIN (NITROSTAT) 0.4 MG SL tablet Place 1 tablet (0.4 mg total) under the tongue every 5 (five) minutes as needed for chest pain. 02/26/15   Troy Sine, MD    Allergies: No Known Allergies  History   Social History  . Marital Status: Legally Separated    Spouse Name: N/A  . Number of Children: 3  . Years of Education: N/A   Occupational History  . Retired    Social History Main Topics  . Smoking status: Former Smoker -- 50 years    Types: Cigarettes    Quit date: 11/23/1999  . Smokeless tobacco: Never Used  . 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 Drug Use: No  . Sexual Activity: Yes   Other Topics Concern  . Not on file   Social History Narrative     Family History  Problem Relation Age of Onset  . Asthma Mother   . Other      Unsure if any heart disease in his family.    Review of Systems: General: negative for chills, fever, night sweats or weight changes.  Cardiovascular: positive for chest pain and dyspnea on exertion. Negative for edema, orthopnea, palpitations, and paroxysmal nocturnal dyspnea.  Dermatological: negative for rash Respiratory: negative for cough or wheezing Urologic: negative for hematuria Abdominal: negative for nausea, vomiting, diarrhea, bright red blood per rectum, melena, or hematemesis Neurologic: negative for visual changes, syncope, or dizziness All other systems reviewed and are otherwise negative except as noted above.  Labs:   Lab Results  Component Value Date   WBC 4.7 04/09/2015   HGB 14.2 04/09/2015   HCT 40.3 04/09/2015   MCV 87.0 04/09/2015   PLT 150 04/09/2015    Recent Labs Lab 04/09/15 1217  NA 140  K 4.1  CL 107  CO2 27  BUN 15  CREATININE 1.45*  CALCIUM 9.6  GLUCOSE 108*    Lab Results  Component Value Date   CHOL 101 05/27/2012   HDL 51 05/27/2012   LDLCALC 40 05/27/2012   TRIG 52 05/27/2012   Lab Results  Component Value Date   DDIMER <0.22 05/26/2012    Radiology/Studies:  Dg Chest 2 View  04/09/2015   CLINICAL DATA:  Chest pain starting 5 a.m.  EXAM: CHEST  2 VIEW   COMPARISON:  02/18/2015  FINDINGS: Cardiomediastinal silhouette is stable. No acute infiltrate or pleural effusion. No pulmonary edema. Again noted metallic fixation rods lower thoracic and lumbar spine.  IMPRESSION: No active cardiopulmonary disease.   Electronically Signed   By: Orlean Bradford.D.  On: 04/09/2015 13:11   Wt Readings from Last 3 Encounters:  02/19/15 166 lb 9.6 oz (75.569 kg)  08/15/14 169 lb 1.6 oz (76.703 kg)  07/08/14 155 lb (70.308 kg)    EKG: SR with large 1st degree AVB, incomplete LBBB, nonspecific inferior TW changes as well as V5-V6 similar to prior.  Physical Exam: Blood pressure 138/74, pulse 55, temperature 98 F (36.7 C), temperature source Oral, resp. rate 17, SpO2 99 %. General: Well developed, well nourished, in no acute distress. Head: Normocephalic, atraumatic, sclera non-icteric, no xanthomas, nares are without discharge.  Neck: Negative for carotid bruits. JVD not elevated. Lungs: Coarse crackles in the bases bilaterally, otherwise clear. Breathing is unlabored. Heart: RRR with S1 S2 - occasional ectopy. No murmurs, rubs, or gallops appreciated. Left chest wall tender to palpation in discrete area below nipple line. No obvious masses or deformity in the skin in this area. Not swollen compared to contralateral size. No rashes. Abdomen: Soft, non-tender, mildly-distended with normoactive bowel sounds. No hepatomegaly. No rebound/guarding. No obvious abdominal masses. Msk:  Strength and tone appear normal for age. Extremities: No clubbing or cyanosis. No edema.  Distal pedal pulses are 1+ and equal bilaterally. Neuro: Alert and oriented X 3. No focal deficit. No facial asymmetry. Moves all extremities spontaneously. Psych:  Responds to questions appropriately with a normal affect.    ASSESSMENT AND PLAN:   1. Chest pain, mostly atypical - there is definitely a musculoskeletal component but cannot currently exclude that symptoms may be due to ACS or  arrhythmia (i.e. his ventricular bigeminy on telemetry). He currently only reports left chest wall tenderness to palpation. Plan to admit, continue aspirin, cycle troponins, and follow on telemetry. Will plan ETT-nuclear stress test in AM. He will need to walk so we can assess his chronotropic response. He is not a candidate for Lexiscan due to 2nd degree heart block. Dr. Tamala Julian and I talked about if he does not get his HR up to target and Dr. Tamala Julian is OK with obtaining pictures at suboptimal HR, as long as it is done with maximal patient effort. He does not want to subject the patient to dobutamine. If cardiac testing is negative, he will need to follow-up with his PCP to discuss his reported morning left breast swelling.  2. Mostly nonobstructive CAD in 2013 - symptoms not convincingly anginal but need to r/o. Continue ASA, CCB, Imdur. No full-dose heparin unless enzymes turn positive. See above re: nuc.  3. H/o NICM in the setting of high PVC burden, previously treated with PVC ablation (with limited to success) and previously on amiodarone - will update 2D echocardiogram. We are somewhat concerned that cardiomyopathy may have returned given the burden of PVCs down in the ER. At discharge, plan 48-hour Holter to quantify PVC burden and to see if he has any correlation of symptoms with these as an outpatient. Plan to refer back to EP as an outpatient if he remains stable overnight.  4. 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 He has history of this per chart. He is on timolol which will be continued unless he has significant worsening of bradycardia.  5. PVCs and ventricular bigeminy - see above.  Raechel Ache PA-C 04/09/2015, 3:47 PM Pager: [phone]

## 2015-04-10 ENCOUNTER — Observation Stay (HOSPITAL_COMMUNITY): Payer: Medicare Other

## 2015-04-10 DIAGNOSIS — R05 Cough: Secondary | ICD-10-CM | POA: Diagnosis not present

## 2015-04-10 DIAGNOSIS — R079 Chest pain, unspecified: Secondary | ICD-10-CM

## 2015-04-10 DIAGNOSIS — R42 Dizziness and giddiness: Secondary | ICD-10-CM | POA: Diagnosis not present

## 2015-04-10 DIAGNOSIS — I440 Atrioventricular block, first degree: Secondary | ICD-10-CM

## 2015-04-10 DIAGNOSIS — I493 Ventricular premature depolarization: Secondary | ICD-10-CM | POA: Diagnosis not present

## 2015-04-10 DIAGNOSIS — R0602 Shortness of breath: Secondary | ICD-10-CM | POA: Diagnosis not present

## 2015-04-10 DIAGNOSIS — I25119 Atherosclerotic heart disease of native coronary artery with unspecified angina pectoris: Secondary | ICD-10-CM | POA: Diagnosis not present

## 2015-04-10 LAB — CBC
HCT: 43.6 % (ref 39.0–52.0)
Hemoglobin: 15.1 g/dL (ref 13.0–17.0)
MCH: 30.1 pg (ref 26.0–34.0)
MCHC: 34.6 g/dL (ref 30.0–36.0)
MCV: 86.9 fL (ref 78.0–100.0)
PLATELETS: 148 10*3/uL — AB (ref 150–400)
RBC: 5.02 MIL/uL (ref 4.22–5.81)
RDW: 12.7 % (ref 11.5–15.5)
WBC: 4.4 10*3/uL (ref 4.0–10.5)

## 2015-04-10 LAB — LIPID PANEL
CHOL/HDL RATIO: 2.3 ratio
Cholesterol: 95 mg/dL (ref 0–200)
HDL: 41 mg/dL (ref >40)
LDL CALC: 45 mg/dL (ref 0–99)
Triglycerides: 45 mg/dL (ref <150)
VLDL: 9 mg/dL (ref 0–40)

## 2015-04-10 LAB — NM MYOCAR MULTI W/SPECT W/WALL MOTION / EF
CHL CUP NUCLEAR SDS: 0
LHR: 0.26
LV dias vol: 98 mL
LV sys vol: 48 mL
NUC STRESS EF: 51 %
SRS: 0
SSS: 0
TID: 0.86

## 2015-04-10 LAB — BASIC METABOLIC PANEL
Anion gap: 10 (ref 5–15)
BUN: 17 mg/dL (ref 6–20)
CO2: 24 mmol/L (ref 22–32)
CREATININE: 1.58 mg/dL — AB (ref 0.61–1.24)
Calcium: 9.7 mg/dL (ref 8.9–10.3)
Chloride: 104 mmol/L (ref 101–111)
GFR calc Af Amer: 47 mL/min — ABNORMAL LOW (ref >60)
GFR, EST NON AFRICAN AMERICAN: 41 mL/min — AB (ref >60)
GLUCOSE: 120 mg/dL — AB (ref 65–99)
Potassium: 4 mmol/L (ref 3.5–5.1)
Sodium: 138 mmol/L (ref 135–145)

## 2015-04-10 LAB — TROPONIN I

## 2015-04-10 MED ORDER — PANTOPRAZOLE SODIUM 40 MG PO TBEC
40.0000 mg | DELAYED_RELEASE_TABLET | Freq: Every day | ORAL | Status: DC
  Administered 2015-04-10 – 2015-04-11 (×2): 40 mg via ORAL
  Filled 2015-04-10 (×2): qty 1

## 2015-04-10 MED ORDER — TECHNETIUM TC 99M SESTAMIBI GENERIC - CARDIOLITE
30.0000 | Freq: Once | INTRAVENOUS | Status: AC | PRN
Start: 2015-04-10 — End: 2015-04-10
  Administered 2015-04-10: 30 via INTRAVENOUS

## 2015-04-10 MED ORDER — TECHNETIUM TC 99M SESTAMIBI GENERIC - CARDIOLITE
10.0000 | Freq: Once | INTRAVENOUS | Status: AC | PRN
  Administered 2015-04-10: 10 via INTRAVENOUS

## 2015-04-10 MED ORDER — NITROGLYCERIN 0.4 MG SL SUBL: 0.4000 mg | SUBLINGUAL_TABLET | SUBLINGUAL | Status: DC | PRN

## 2015-04-10 MED ORDER — NITROGLYCERIN 0.4 MG SL SUBL
SUBLINGUAL_TABLET | SUBLINGUAL | Status: AC
  Filled 2015-04-10: qty 1

## 2015-04-10 MED ORDER — GI COCKTAIL ~~LOC~~
30.0000 mL | Freq: Two times a day (BID) | ORAL | Status: DC | PRN
  Administered 2015-04-10: 30 mL via ORAL
  Filled 2015-04-10: qty 30

## 2015-04-10 NOTE — Progress Notes (Signed)
nuc study was negative for ischemia.  Pt had further pain this afternoon.  I added protonix and prn GI cocktail.  Plan will be for discharge in AM.

## 2015-04-10 NOTE — Progress Notes (Signed)
Pt complaint of 5/10 chest pressure/tighness.  VSS, EKG obtained.  CP relieved with Sublingual nitro x3.  Cecilie Kicks, PA notified. No new orders received,  Will continue to closely monitor.

## 2015-04-10 NOTE — Progress Notes (Signed)
Pt resting in bed and c/o 8/10 CP. EKG obtained. V/S stable. 3 sl nitro given which relieved his pain. Md on call made aware. No new orders received.  Will cont to monitor pt.

## 2015-04-10 NOTE — Progress Notes (Signed)
Subjective: No complaints until after TM, + chest pain 2/10 improved after NTG  Objective: Vital signs in last 24 hours: Temp:  [97.9 F (36.6 C)-98.5 F (36.9 C)] 98.5 F (36.9 C) (05/19 0511) Pulse Rate:  [25-62] 58 (05/19 0511) Resp:  [13-25] 18 (05/19 0511) BP: (110-154)/(65-93) 124/65 mmHg (05/19 0511) SpO2:  [94 %-100 %] 100 % (05/19 0511) Weight:  [169 lb 6.4 oz (76.839 kg)-171 lb 1.6 oz (77.61 kg)] 169 lb 6.4 oz (76.839 kg) (05/19 0511) Weight change:  Last BM Date: 04/09/15 Intake/Output from previous day: -850 05/18 0701 - 05/19 0700 In: -  Out: 850 [Urine:850] Intake/Output this shift:    PE: General:Pleasant affect, NAD Skin:Warm and dry, brisk capillary refill HEENT:normocephalic, sclera clear, mucus membranes moist Heart:S1S2 RRR with premature beats, without murmur, gallup, rub or click Lungs:clear without rales, rhonchi, or wheezes KDX:IPJA, non tender, + BS, do not palpate liver spleen or masses Ext:no lower ext edema, 2+ pedal pulses, 2+ radial pulses Neuro:alert and oriented X 3, MAE, follows commands, + facial symmetry Tele:  SR , wenchebach I, 1st degree block.  With TM bursts of 13 beats NSVT   Lab Results:  Recent Labs  04/09/15 1217 04/10/15 0735  WBC 4.7 4.4  HGB 14.2 15.1  HCT 40.3 43.6  PLT 150 148*   BMET  Recent Labs  04/09/15 1217  NA 140  K 4.1  CL 107  CO2 27  GLUCOSE 108*  BUN 15  CREATININE 1.45*  CALCIUM 9.6    Recent Labs  04/09/15 1944 04/10/15 0033  TROPONINI <0.03 <0.03    Lab Results  Component Value Date   CHOL 101 05/27/2012   HDL 51 05/27/2012   LDLCALC 40 05/27/2012   TRIG 52 05/27/2012   CHOLHDL 2.0 05/27/2012   Lab Results  Component Value Date   HGBA1C 5.9* 05/26/2012     Lab Results  Component Value Date   TSH 1.340 04/09/2015     Studies/Results: Dg Chest 2 View  04/09/2015   CLINICAL DATA:  Chest pain starting 5 a.m.  EXAM: CHEST  2 VIEW  COMPARISON:  02/18/2015   FINDINGS: Cardiomediastinal silhouette is stable. No acute infiltrate or pleural effusion. No pulmonary edema. Again noted metallic fixation rods lower thoracic and lumbar spine.  IMPRESSION: No active cardiopulmonary disease.   Electronically Signed   By: Lahoma Crocker M.D.   On: 04/09/2015 13:11    Medications: I have reviewed the patient's current medications. Scheduled Meds: . amLODipine  10 mg Oral Daily  . aspirin EC  81 mg Oral Daily  . cycloSPORINE  1 drop Both Eyes Daily  . furosemide  20 mg Oral Daily  . heparin  5,000 Units Subcutaneous 3 times per day  . isosorbide mononitrate  30 mg Oral Daily  . latanoprost  1 drop Both Eyes Daily  . potassium chloride SA  20 mEq Oral Daily  . pravastatin  40 mg Oral Daily  . sodium chloride  3 mL Intravenous Q12H  . spironolactone  12.5 mg Oral Daily  . 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 His cardiomyopathy was previously thought in part due to his PVCs. He underwent ablation in 2013 although ablation therapy was limited as the PVC focus was found to arise from the right coronary cusp of the aorta near the right coronary artery. He was on amiodarone for a while after and this was eventually tapered to discontinuation after PVCs burden improved.  Admitted with chest pain.  Also with 1st and second degree HB.   Chest Pain:   Negative troponin, for nuc study today.    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 : 15 minutes. [facility] R  Nurse Practitioner Certified Pager [phone] or after 5pm and on weekends call [phone] 04/10/2015, 8:47 AM

## 2015-04-10 NOTE — Progress Notes (Signed)
  Echocardiogram 2D Echocardiogram has been performed.  [name] 04/10/2015, 3:55 PM

## 2015-04-10 NOTE — Discharge Instructions (Signed)
Chest Pain Observation  It is often hard to give a specific diagnosis for the cause of chest pain. Among other possibilities your symptoms might be caused by inadequate oxygen delivery to your heart (angina). Angina that is not treated or evaluated can lead to a heart attack (myocardial infarction) or death.  Blood tests, electrocardiograms, and X-rays may have been done to help determine a possible cause of your chest pain. After evaluation and observation, your health care provider has determined that it is unlikely your pain was caused by an unstable condition that requires hospitalization. However, a full evaluation of your pain may need to be completed, with additional diagnostic testing as directed. It is very important to keep your follow-up appointments. Not keeping your follow-up appointments could result in permanent heart damage, disability, or death. If there is any problem keeping your follow-up appointments, you must call your health care provider.  HOME CARE INSTRUCTIONS   Due to the slight chance that your pain could be angina, it is important to follow your health care provider's treatment plan and also maintain a healthy lifestyle:  · Maintain or work toward achieving a healthy weight.  · Stay physically active and exercise regularly.  · Decrease your salt intake.  · Eat a balanced, healthy diet. Talk to a dietitian to learn about heart-healthy foods.  · Increase your fiber intake by including whole grains, vegetables, fruits, and nuts in your diet.  · Avoid situations that cause stress, anger, or depression.  · Take medicines as advised by your health care provider. Report any side effects to your health care provider. Do not stop medicines or adjust the dosages on your own.  · Quit smoking. Do not use nicotine patches or gum until you check with your health care provider.  · Keep your blood pressure, blood sugar, and cholesterol levels within normal limits.  · Limit alcohol intake to no more than  1 drink per day for women who are not pregnant and 2 drinks per day for men.  · Do not abuse drugs.  SEEK IMMEDIATE MEDICAL CARE IF:  You have severe chest pain or pressure which may include symptoms such as:  · You feel pain or pressure in your arms, neck, jaw, or back.  · You have severe back or abdominal pain, feel sick to your stomach (nauseous), or throw up (vomit).  · You are sweating profusely.  · You are having a fast or irregular heartbeat.  · You feel short of breath while at rest.  · You notice increasing shortness of breath during rest, sleep, or with activity.  · You have chest pain that does not get better after rest or after taking your usual medicine.  · You wake from sleep with chest pain.  · You are unable to sleep because you cannot breathe.  · You develop a frequent cough or you are coughing up blood.  · You feel dizzy, faint, or experience extreme fatigue.  · You develop severe weakness, dizziness, fainting, or chills.  Any of these symptoms may represent a serious problem that is an emergency. Do not wait to see if the symptoms will go away. Call your local emergency services (911 in the [place] Do not drive yourself to the hospital.  MAKE SURE YOU:  · Understand these instructions.  · Will watch your condition.  · Will get help right away if you are not doing well or get worse.  Document Released: 12/11/2010 Document Revised: 11/13/2013 Document Reviewed: 05/10/2013    ExitCare® Patient Information ©2015 [employer]. This information is not intended to replace advice given to you by your health care provider. Make sure you discuss any questions you have with your health care provider.

## 2015-04-10 NOTE — Progress Notes (Signed)
ETT myoview completed without complication.  Did not reach 85% pred. Max, HR pk at 90-100 test stopped due to fatigue and dyspnea.  Pt completed 2 min into stage 3.  + couplets, triple PVCs and at end of study NSVT rate 125 2-3 episodes of 13 beats.  Pt unaware.    At 4 min in to recovery developed chest pain 2/10, NTG with easing of pain.  Nuc results to follow.

## 2015-04-11 ENCOUNTER — Encounter: Payer: Self-pay | Admitting: Physician Assistant

## 2015-04-11 ENCOUNTER — Other Ambulatory Visit: Payer: Self-pay | Admitting: Physician Assistant

## 2015-04-11 DIAGNOSIS — I493 Ventricular premature depolarization: Secondary | ICD-10-CM | POA: Diagnosis not present

## 2015-04-11 DIAGNOSIS — I472 Ventricular tachycardia, unspecified: Secondary | ICD-10-CM | POA: Insufficient documentation

## 2015-04-11 DIAGNOSIS — R079 Chest pain, unspecified: Secondary | ICD-10-CM | POA: Diagnosis not present

## 2015-04-11 DIAGNOSIS — I4729 Other ventricular tachycardia: Secondary | ICD-10-CM

## 2015-04-11 DIAGNOSIS — I440 Atrioventricular block, first degree: Secondary | ICD-10-CM | POA: Diagnosis not present

## 2015-04-11 DIAGNOSIS — I441 Atrioventricular block, second degree: Secondary | ICD-10-CM

## 2015-04-11 DIAGNOSIS — I25119 Atherosclerotic heart disease of native coronary artery with unspecified angina pectoris: Secondary | ICD-10-CM | POA: Diagnosis not present

## 2015-04-11 HISTORY — DX: Other ventricular tachycardia: I47.29

## 2015-04-11 HISTORY — DX: Ventricular tachycardia: I47.2

## 2015-04-11 HISTORY — DX: Ventricular tachycardia, unspecified: I47.20

## 2015-04-11 LAB — MAGNESIUM: MAGNESIUM: 2.3 mg/dL (ref 1.7–2.4)

## 2015-04-11 MED ORDER — METOPROLOL TARTRATE 12.5 MG HALF TABLET
12.5000 mg | ORAL_TABLET | Freq: Once | ORAL | Status: AC
  Administered 2015-04-11: 12.5 mg via ORAL
  Filled 2015-04-11: qty 1

## 2015-04-11 MED ORDER — PANTOPRAZOLE SODIUM 40 MG PO TBEC: 40.0000 mg | DELAYED_RELEASE_TABLET | Freq: Two times a day (BID) | ORAL | Status: DC

## 2015-04-11 MED ORDER — PANTOPRAZOLE SODIUM 40 MG PO TBEC: 40.0000 mg | DELAYED_RELEASE_TABLET | Freq: Every day | ORAL | Status: DC

## 2015-04-11 NOTE — Progress Notes (Signed)
Verified with Rosaria Ferries, PA-C that the office will CALL the patient to make an appointment for the Holter Monitor.  Pt updated and agrees.

## 2015-04-11 NOTE — Progress Notes (Addendum)
Pt had a 30 beat run of V tach while resting in bed. Pt denied any CP, however he stated "he could feel his heart racing" Md on call made aware. New orders received. Will cont to monitor pt.

## 2015-04-11 NOTE — Discharge Summary (Signed)
CARDIOLOGY DISCHARGE SUMMARY   Patient ID: [name] MRN: [id number] DOB/AGE: Sep 19, 1941 74 y.o.  Admit date: 04/09/2015 Discharge date: 04/11/2015  PCP: Elizabeth Palau, MD Primary Cardiologist: Dr. Claiborne Billings Electrophysiologist: Dr. 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 He had chest pain and came to the hospital where he was admitted for further evaluation and treatment.  His cardiac enzymes were negative for MI. He had an exercise Myoview on 04/10/2015. During the stress portion of the Myoview, he had episodes of nonsustained VT. He did not reach target heart rate, reaching only 76% of his predicted maximum. Results are below, it was felt to be a low risk study. He did not have chest pain during the test.  An echocardiogram was performed and showed mild left ventricular dysfunction and grade 1 diastolic dysfunction, but no wall motion abnormalities.  Because of the problem he had reaching target heart rate despite not being on any rate lowering medications, and his nonsustained VT, a Holter monitor is ordered and he  will follow-up as an outpatient for this.   On 04/11/2015, he was seen by Dr. Tamala Julian and all data were reviewed. He was bradycardic at times with a heart rate as low as 40. He also had some Wenckebach but was asymptomatic. He had PVCs but no more nonsustained VT. He had an episode of chest pain overnight that was relieved by GI cocktail. He had been placed on Protonix on admission, we will increase this to twice a day for 2 weeks, then once daily. He was ambulating without chest pain or shortness of breath and considered stable for discharge, to follow up as an outpatient.  Labs:   Lab Results  Component Value Date   WBC 4.4 04/10/2015   HGB 15.1 04/10/2015   HCT 43.6 04/10/2015   MCV 86.9 04/10/2015   PLT 148* 04/10/2015     Recent Labs Lab 04/10/15 0735  NA 138  K 4.0  CL 104  CO2 24  BUN 17  CREATININE 1.58*  CALCIUM 9.7  GLUCOSE 120*    Recent Labs  04/09/15 1944 04/10/15 0033 04/10/15 0735  TROPONINI <0.03 <0.03 <0.03   Lipid Panel     Component Value Date/Time   CHOL 95 04/10/2015 0735   TRIG 45 04/10/2015 0735   HDL 41 04/10/2015 0735   CHOLHDL 2.3 04/10/2015 0735   VLDL 9 04/10/2015 0735   LDLCALC 45 04/10/2015 0735    B NATRIURETIC PEPTIDE  Date/Time Value Ref Range Status  04/09/2015 12:17 PM 49.9 0.0 - 100.0 pg/mL Final      Radiology: Dg Chest 2 View  04/09/2015   CLINICAL DATA:  Chest pain starting 5 a.m.  EXAM: CHEST  2 VIEW  COMPARISON:  02/18/2015  FINDINGS: Cardiomediastinal silhouette is stable. No acute infiltrate or pleural effusion. No pulmonary edema. Again noted metallic fixation rods lower thoracic and lumbar spine.  IMPRESSION: No active cardiopulmonary disease.   Electronically Signed   By: Lahoma Crocker M.D.   On: 04/09/2015 13:11   Nm Myocar Multi W/spect W/wall Motion / Ef 04/10/2015    There was no ST segment deviation noted during stress.  This is a low risk study.  The left ventricular ejection fraction is mildly decreased (45-54%).    Low risk stress nuclear study with chest pain in recovery; no new ST  changes; patient with NSVT during study (longest run 13 beats); patient  did not achieve THR (76%predicted); normal perfusion with no ischemia or  infarction; gated EF 51% with mild global hypokinesis; TID-0.86; ESV 48  ml; EDV 98 ml.  04/10/2015    There was no ST segment deviation noted during stress.  The study is normal.  This is a low risk study.  The left ventricular ejection fraction is mildly decreased (45-54%).   Low risk stress nuclear study with chest pain in recovery; no new ST  changes; patient with NSVT during study (longest run 13 beats); patient  did not achieve THR (76%predicted); normal perfusion with no ischemia or  infarction; gated EF 51% with mild global hypokinesis; TID-0.86; ESV 48  ml; EDV 98 ml.   EKG: 04/10/2015 Sinus bradycardia with marked first-degree AV block Vent. rate 58 BPM PR interval 400 ms QRS duration 112 ms QT/QTc 446/437 ms P-R-T axes 71 -35 -7  Echo: 04/10/2015 Conclusions - Left ventricle: LVEF is approximately 45 to 50% with inferior hypokinesis The cavity size was normal. Wall thickness was increased in a pattern of mild LVH. Doppler parameters are consistent with abnormal left ventricular relaxation (grade 1 diastolic dysfunction).  FOLLOW UP PLANS AND APPOINTMENTS No Known Allergies   Medication List    TAKE these medications        acetaminophen 325 MG tablet  Commonly known as:  TYLENOL  Take 650 mg by mouth every 6 (six) hours as needed for mild pain or headache.     amLODipine 10 MG tablet  Commonly known as:  NORVASC  Take 10 mg by mouth daily.     aspirin 325 MG tablet  Take 81 mg by mouth daily.     furosemide 20 MG tablet  Commonly known as:  LASIX  Take 20 mg by mouth daily.     GERITOL COMPLETE PO  Take 1 tablet by mouth daily.     HYDROcodone-acetaminophen 5-325 MG per tablet  Commonly known as:  NORCO/VICODIN  Take 1-2 tablets by  mouth every 4 (four) hours as needed for moderate pain.     isosorbide mononitrate 30 MG 24 hr tablet  Commonly known as:  IMDUR  Take 30 mg by mouth daily.     LUMIGAN 0.01 % Soln  Generic drug:  bimatoprost  Place 1 drop into both eyes daily.     nitroGLYCERIN 0.4 MG SL tablet  Commonly known as:  NITROSTAT  Place 1 tablet (0.4 mg total) under the tongue every 5 (five) minutes as needed for chest pain.     pantoprazole 40 MG tablet  Commonly known as:  PROTONIX  Take 1 tablet (40 mg total) by mouth 2 (two) times daily before a meal. Take 1 tablet 2  times daily for 2 weeks, then 1 tablet daily.     potassium chloride SA 20 MEQ tablet  Commonly known as:  K-DUR,KLOR-CON  Take 20 mEq by mouth daily.     pravastatin 40 MG tablet  Commonly known as:  PRAVACHOL  Take 40 mg by mouth daily.     RESTASIS 0.05 % ophthalmic emulsion  Generic drug:  cycloSPORINE  Place 1 drop into both eyes daily.     spironolactone 25 MG tablet  Commonly known as:  ALDACTONE  Take 12.5 mg by mouth daily.     timolol 0.5 % ophthalmic gel-forming  Commonly known as:  TIMOPTIC-XR  Place 1 drop into both eyes daily.        Discharge Instructions    Diet - low sodium heart healthy    Complete by:  As directed      Increase activity slowly    Complete by:  As directed           Follow-up Information    Follow up with Troy Sine, MD.   Specialty:  Cardiology   Why:  The office will call.   Contact information:   [address] [place] [address] [phone]       Follow up with Thompson Grayer, MD.   Specialty:  Cardiology   Why:  The office will call.   Contact information:   [place] [address] [phone]       BRING ALL MEDICATIONS WITH YOU TO FOLLOW UP APPOINTMENTS  Time spent with patient to include physician time: 41 min Signed: Rosaria Ferries, PA-C 04/11/2015, 11:11 AM Co-Sign MD

## 2015-04-11 NOTE — Progress Notes (Signed)
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 He was admitted 05/18 w/ chest pain, MV was low-risk, but pt had NSVT during GXT portion and did not reach target HR.  SUBJECTIVE: Pt had some chest pain last night at rest, had no chest pain during GXT. No palpitations. Chest pain improved w/ GI cocktail.  OBJECTIVE Filed Vitals:   04/10/15 1239 04/10/15 2008 04/11/15 0113 04/11/15 0503  BP: 116/70 130/70 131/64 121/66  Pulse:  62 59 58  Temp:  98.7 F (37.1 C) 98.4 F (36.9 C) 98.1 F (36.7 C)  TempSrc:  Oral Oral Oral  Resp:  18 15 16   Height:      Weight:    169 lb 9.6 oz (76.93 kg)  SpO2: 100% 98% 97% 99%    Intake/Output Summary (Last 24 hours) at 04/11/15 0954 Last data filed at 04/11/15 0850  Gross per 24 hour  Intake    483 ml  Output      0 ml  Net    483 ml   Filed Weights   04/09/15 1835 04/10/15 0511 04/11/15 0503  Weight: 170 lb 3.1 oz (77.2 kg) 169 lb 6.4 oz (76.839 kg) 169 lb 9.6 oz (76.93 kg)    PHYSICAL EXAM General: Well developed, well nourished, male in no acute distress. Head: Normocephalic, atraumatic.  Neck: Supple without bruits, JVD not elevated. Lungs:   Resp regular and unlabored, CTA. Heart: RRR, S1, S2, no S3, S4, or murmur; no rub. Abdomen: Soft, non-tender, non-distended, BS + x 4.  Extremities: No clubbing, cyanosis, no edema.  Neuro: Alert and oriented X 3. Moves all extremities spontaneously. Psych: Normal affect.  LABS: CBC:  Recent Labs  04/09/15 1217 04/10/15 0735  WBC 4.7 4.4  HGB 14.2 15.1  HCT 40.3 43.6  MCV 87.0 86.9  PLT 150 388*   Basic Metabolic Panel:  Recent Labs  04/09/15 1217 04/09/15 1944 04/10/15 0735  NA 140  --  138  K 4.1  --  4.0  CL 107  --  104  CO2 27  --  24  GLUCOSE 108*  --  120*  BUN 15  --  17  CREATININE 1.45*  --  1.58*  CALCIUM 9.6  --  9.7  MG  --  1.7  --    Cardiac Enzymes:  Recent Labs  04/09/15 1944 04/10/15 0033 04/10/15 0735  TROPONINI <0.03 <0.03 <0.03    Recent Labs  04/09/15 1223  TROPIPOC 0.00   BNP:  B NATRIURETIC PEPTIDE  Date/Time Value Ref Range Status  04/09/2015 12:17 PM 49.9 0.0 - 100.0 pg/mL Final   Fasting Lipid Panel:  Recent Labs  04/10/15 0735  CHOL 95  HDL 41  LDLCALC 45  TRIG 45  CHOLHDL 2.3   Thyroid Function Tests:  Recent Labs  04/09/15 1944  TSH 1.340    TELE:  SR, NSVT      Radiology/Studies: Dg Chest 2 View  04/09/2015   CLINICAL DATA:  Chest pain starting 5 a.m.  EXAM: CHEST  2 VIEW  COMPARISON:  02/18/2015  FINDINGS: Cardiomediastinal silhouette is stable. No acute infiltrate or pleural effusion. No pulmonary edema. Again noted metallic fixation rods lower thoracic and lumbar spine.  IMPRESSION: No active cardiopulmonary disease.   Electronically Signed   By: Lahoma Crocker M.D.   On: 04/09/2015 13:11   Nm Myocar Multi W/spect W/wall Motion / Ef 04/10/2015    There was no ST segment deviation noted during stress.  This is a low risk study.  The left ventricular ejection fraction is mildly decreased (45-54%).   Low risk stress nuclear study with chest pain in recovery; no new ST  changes; patient with NSVT during  study (longest run 13 beats); patient  did not achieve THR (76%predicted); normal perfusion with no ischemia or  infarction; gated EF 51% with mild global hypokinesis; TID-0.86; ESV 48  ml; EDV 98 ml.  04/10/2015    There was no ST segment deviation noted during stress.  The study is normal.  This is a low risk study.  The left ventricular ejection fraction is mildly decreased (45-54%).   Low risk stress nuclear study with chest pain in recovery; no new ST  changes; patient with NSVT during study (longest run 13 beats); patient  did not achieve THR (76%predicted); normal perfusion with no ischemia or  infarction; gated EF 51% with mild global hypokinesis; TID-0.86; ESV 48  ml; EDV 98 ml.    Current Medications:  . amLODipine  10 mg Oral Daily  . aspirin EC  81 mg Oral Daily  . cycloSPORINE  1 drop Both Eyes Daily  . furosemide  20 mg Oral Daily  . heparin  5,000 Units Subcutaneous 3 times per day  . isosorbide mononitrate  30 mg Oral Daily  . latanoprost  1 drop Both Eyes Daily  . pantoprazole  40 mg Oral Daily  . potassium chloride SA  20 mEq Oral Daily  . pravastatin  40 mg Oral Daily  . sodium chloride  3 mL Intravenous Q12H  . spironolactone  12.5 mg Oral Daily  . timolol  1 drop Both Eyes Daily      ASSESSMENT AND PLAN: Principal Problem:   Chest pain with moderate risk for cardiac etiology - Ez neg MI - MV low risk - no further ischemic eval at this time, f/u with Dr Claiborne Billings    NSVT - hx PVC ablation 2013 by Dr Rayann Heman - will order Holter monitor, then have pt f/u with Dr Rayann Heman  Otherwise, continue current therapy, since pt sx improved w/ GI cocktail, continue Protonix BID for 2 weeks, then qd. 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

## 2015-04-15 ENCOUNTER — Ambulatory Visit (INDEPENDENT_AMBULATORY_CARE_PROVIDER_SITE_OTHER): Payer: Medicare Other

## 2015-04-15 DIAGNOSIS — I4729 Other ventricular tachycardia: Secondary | ICD-10-CM

## 2015-04-15 DIAGNOSIS — I472 Ventricular tachycardia: Secondary | ICD-10-CM | POA: Diagnosis not present

## 2015-06-04 ENCOUNTER — Encounter: Payer: Self-pay | Admitting: Gastroenterology

## 2015-07-03 ENCOUNTER — Other Ambulatory Visit: Payer: Self-pay | Admitting: Cardiovascular Disease

## 2015-07-03 NOTE — Telephone Encounter (Signed)
Rx request sent to pharmacy.  

## 2015-07-20 ENCOUNTER — Other Ambulatory Visit: Payer: Self-pay | Admitting: Cardiovascular Disease

## 2015-07-21 NOTE — Telephone Encounter (Signed)
REFILL 

## 2015-07-30 ENCOUNTER — Encounter: Payer: Self-pay | Admitting: Podiatry

## 2015-07-30 ENCOUNTER — Ambulatory Visit (INDEPENDENT_AMBULATORY_CARE_PROVIDER_SITE_OTHER): Payer: Medicare Other | Admitting: Podiatry

## 2015-07-30 DIAGNOSIS — M79676 Pain in unspecified toe(s): Secondary | ICD-10-CM

## 2015-07-30 DIAGNOSIS — B351 Tinea unguium: Secondary | ICD-10-CM

## 2015-07-30 NOTE — Progress Notes (Signed)
Patient ID: [name], male   DOB: October 22, 1941, 74 y.o.   MRN: [id number]  Subjective: This patient presents today complaining of uncomfortable toenails and requests nail debridement.  Objective: The toenails are elongated, brittle, incurvated, discolored and and tender to direct palpation  Assessment: Symptomatic onychomycosis 6-10 Peripheral arterial disease evaluated by Dr. Gwenlyn Found  Plan: Debridement toenails 10 mechanically and electrically without any bleeding  Reappoint 4 months

## 2015-08-04 ENCOUNTER — Other Ambulatory Visit: Payer: Self-pay | Admitting: Cardiovascular Disease

## 2015-08-25 ENCOUNTER — Other Ambulatory Visit: Payer: Self-pay | Admitting: Cardiovascular Disease

## 2015-09-08 ENCOUNTER — Other Ambulatory Visit: Payer: Self-pay | Admitting: Cardiovascular Disease

## 2015-09-08 MED ORDER — AMLODIPINE BESYLATE 10 MG PO TABS: 10.0000 mg | ORAL_TABLET | Freq: Every day | ORAL | Status: DC

## 2015-09-08 NOTE — Telephone Encounter (Signed)
Rx(s) sent to pharmacy electronically.  

## 2015-09-08 NOTE — Telephone Encounter (Signed)
°  STAT if patient is at the pharmacy , call can be transferred to refill team.   1. Which medications need to be refilled? Amlodipine  2. Which pharmacy/location is medication to be sent to?[phone]  3. Do they need a 30 day or 90 day supply? 30 and refills

## 2015-09-11 ENCOUNTER — Other Ambulatory Visit: Payer: Self-pay | Admitting: Cardiovascular Disease

## 2015-09-19 ENCOUNTER — Other Ambulatory Visit: Payer: Self-pay | Admitting: Physician Assistant

## 2015-09-19 ENCOUNTER — Other Ambulatory Visit: Payer: Self-pay | Admitting: *Deleted

## 2015-09-19 ENCOUNTER — Other Ambulatory Visit: Payer: Self-pay | Admitting: Cardiovascular Disease

## 2015-09-19 MED ORDER — PANTOPRAZOLE SODIUM 40 MG PO TBEC: 40.0000 mg | DELAYED_RELEASE_TABLET | Freq: Two times a day (BID) | ORAL | Status: DC

## 2015-10-04 ENCOUNTER — Other Ambulatory Visit: Payer: Self-pay | Admitting: Cardiovascular Disease

## 2015-10-08 ENCOUNTER — Other Ambulatory Visit: Payer: Self-pay | Admitting: Cardiovascular Disease

## 2015-10-19 ENCOUNTER — Other Ambulatory Visit: Payer: Self-pay | Admitting: Cardiovascular Disease

## 2015-10-20 NOTE — Telephone Encounter (Signed)
Rx(s) sent to pharmacy electronically.  

## 2015-11-06 ENCOUNTER — Other Ambulatory Visit: Payer: Self-pay | Admitting: Cardiovascular Disease

## 2015-11-06 NOTE — Telephone Encounter (Signed)
Rx(s) sent to pharmacy electronically.  

## 2015-11-19 ENCOUNTER — Other Ambulatory Visit: Payer: Self-pay | Admitting: Cardiovascular Disease

## 2015-11-19 NOTE — Telephone Encounter (Signed)
Rx request sent to pharmacy.  

## 2015-11-27 ENCOUNTER — Emergency Department (HOSPITAL_COMMUNITY): Payer: Medicare Other

## 2015-11-27 ENCOUNTER — Encounter (HOSPITAL_COMMUNITY): Payer: Self-pay | Admitting: *Deleted

## 2015-11-27 ENCOUNTER — Emergency Department (HOSPITAL_COMMUNITY)
Admission: EM | Admit: 2015-11-27 | Discharge: 2015-11-27 | Disposition: A | Discharge status: Home / Self Care | Payer: Medicare Other | Attending: Emergency Medicine | Admitting: Emergency Medicine

## 2015-11-27 DIAGNOSIS — I5022 Chronic systolic (congestive) heart failure: Secondary | ICD-10-CM | POA: Insufficient documentation

## 2015-11-27 DIAGNOSIS — R10A0 Flank pain, unspecified side: Secondary | ICD-10-CM

## 2015-11-27 DIAGNOSIS — R1031 Right lower quadrant pain: Secondary | ICD-10-CM | POA: Insufficient documentation

## 2015-11-27 DIAGNOSIS — R1032 Left lower quadrant pain: Secondary | ICD-10-CM | POA: Insufficient documentation

## 2015-11-27 DIAGNOSIS — M1990 Unspecified osteoarthritis, unspecified site: Secondary | ICD-10-CM | POA: Diagnosis not present

## 2015-11-27 DIAGNOSIS — I2510 Atherosclerotic heart disease of native coronary artery without angina pectoris: Secondary | ICD-10-CM | POA: Insufficient documentation

## 2015-11-27 DIAGNOSIS — I129 Hypertensive chronic kidney disease with stage 1 through stage 4 chronic kidney disease, or unspecified chronic kidney disease: Secondary | ICD-10-CM | POA: Insufficient documentation

## 2015-11-27 DIAGNOSIS — M545 Low back pain, unspecified: Secondary | ICD-10-CM

## 2015-11-27 DIAGNOSIS — R109 Unspecified abdominal pain: Secondary | ICD-10-CM

## 2015-11-27 DIAGNOSIS — R1011 Right upper quadrant pain: Secondary | ICD-10-CM | POA: Insufficient documentation

## 2015-11-27 DIAGNOSIS — Z7982 Long term (current) use of aspirin: Secondary | ICD-10-CM | POA: Insufficient documentation

## 2015-11-27 DIAGNOSIS — M25531 Pain in right wrist: Secondary | ICD-10-CM

## 2015-11-27 DIAGNOSIS — Z79899 Other long term (current) drug therapy: Secondary | ICD-10-CM | POA: Diagnosis not present

## 2015-11-27 DIAGNOSIS — N183 Chronic kidney disease, stage 3 (moderate): Secondary | ICD-10-CM | POA: Insufficient documentation

## 2015-11-27 DIAGNOSIS — I252 Old myocardial infarction: Secondary | ICD-10-CM | POA: Insufficient documentation

## 2015-11-27 DIAGNOSIS — Z86010 Personal history of colonic polyps: Secondary | ICD-10-CM | POA: Diagnosis not present

## 2015-11-27 DIAGNOSIS — Z87891 Personal history of nicotine dependence: Secondary | ICD-10-CM | POA: Diagnosis not present

## 2015-11-27 LAB — CBC WITH DIFFERENTIAL/PLATELET
BASOS ABS: 0 10*3/uL (ref 0.0–0.1)
BASOS PCT: 1 %
EOS ABS: 0.2 10*3/uL (ref 0.0–0.7)
EOS PCT: 6 %
HCT: 39.8 % (ref 39.0–52.0)
Hemoglobin: 13.3 g/dL (ref 13.0–17.0)
Lymphocytes Relative: 46 %
Lymphs Abs: 2 10*3/uL (ref 0.7–4.0)
MCH: 29.8 pg (ref 26.0–34.0)
MCHC: 33.4 g/dL (ref 30.0–36.0)
MCV: 89.2 fL (ref 78.0–100.0)
MONO ABS: 0.3 10*3/uL (ref 0.1–1.0)
Monocytes Relative: 6 %
Neutro Abs: 1.7 10*3/uL (ref 1.7–7.7)
Neutrophils Relative %: 41 %
PLATELETS: 143 10*3/uL — AB (ref 150–400)
RBC: 4.46 MIL/uL (ref 4.22–5.81)
RDW: 12.6 % (ref 11.5–15.5)
WBC: 4.2 10*3/uL (ref 4.0–10.5)

## 2015-11-27 LAB — URINALYSIS, ROUTINE W REFLEX MICROSCOPIC
BILIRUBIN URINE: NEGATIVE
Glucose, UA: NEGATIVE mg/dL
Hgb urine dipstick: NEGATIVE
Ketones, ur: NEGATIVE mg/dL
Nitrite: NEGATIVE
PH: 6 (ref 5.0–8.0)
Protein, ur: NEGATIVE mg/dL
Specific Gravity, Urine: 1.008 (ref 1.005–1.030)

## 2015-11-27 LAB — BASIC METABOLIC PANEL
ANION GAP: 7 (ref 5–15)
BUN: 18 mg/dL (ref 6–20)
CALCIUM: 9.5 mg/dL (ref 8.9–10.3)
CO2: 26 mmol/L (ref 22–32)
Chloride: 109 mmol/L (ref 101–111)
Creatinine, Ser: 1.56 mg/dL — ABNORMAL HIGH (ref 0.61–1.24)
GFR calc Af Amer: 48 mL/min — ABNORMAL LOW (ref >60)
GFR calc non Af Amer: 41 mL/min — ABNORMAL LOW (ref >60)
GLUCOSE: 106 mg/dL — AB (ref 65–99)
POTASSIUM: 4.1 mmol/L (ref 3.5–5.1)
SODIUM: 142 mmol/L (ref 135–145)

## 2015-11-27 LAB — URINE MICROSCOPIC-ADD ON: RBC / HPF: NONE SEEN RBC/hpf (ref 0–5)

## 2015-11-27 MED ORDER — IOHEXOL 300 MG/ML  SOLN
100.0000 mL | Freq: Once | INTRAMUSCULAR | Status: AC | PRN
  Administered 2015-11-27: 100 mL via INTRAVENOUS

## 2015-11-27 MED ORDER — OXYCODONE HCL 5 MG PO TABS: 5.0000 mg | ORAL_TABLET | Freq: Four times a day (QID) | ORAL | Status: DC | PRN

## 2015-11-27 MED ORDER — KETOROLAC TROMETHAMINE 30 MG/ML IJ SOLN
30.0000 mg | Freq: Once | INTRAMUSCULAR | Status: AC
  Administered 2015-11-27: 30 mg via INTRAVENOUS
  Filled 2015-11-27: qty 1

## 2015-11-27 NOTE — ED Notes (Signed)
Resident at bedside.  

## 2015-11-27 NOTE — Discharge Instructions (Signed)
Abdominal Pain, Adult Many things can cause abdominal pain. Usually, abdominal pain is not caused by a disease and will improve without treatment. It can often be observed and treated at home. Your health care provider will do a physical exam and possibly order blood tests and X-rays to help determine the seriousness of your pain. However, in many cases, more time must pass before a clear cause of the pain can be found. Before that point, your health care provider may not know if you need more testing or further treatment. HOME CARE INSTRUCTIONS Monitor your abdominal pain for any changes. The following actions may help to alleviate any discomfort you are experiencing:  Only take over-the-counter or prescription medicines as directed by your health care provider.  Do not take laxatives unless directed to do so by your health care provider.  Try a clear liquid diet (broth, tea, or water) as directed by your health care provider. Slowly move to a bland diet as tolerated. SEEK MEDICAL CARE IF:  You have unexplained abdominal pain.  You have abdominal pain associated with nausea or diarrhea.  You have pain when you urinate or have a bowel movement.  You experience abdominal pain that wakes you in the night.  You have abdominal pain that is worsened or improved by eating food.  You have abdominal pain that is worsened with eating fatty foods.  You have a fever. SEEK IMMEDIATE MEDICAL CARE IF:  Your pain does not go away within 2 hours.  You keep throwing up (vomiting).  Your pain is felt only in portions of the abdomen, such as the right side or the left lower portion of the abdomen.  You pass bloody or black tarry stools. MAKE SURE YOU:  Understand these instructions.  Will watch your condition.  Will get help right away if you are not doing well or get worse.   This information is not intended to replace advice given to you by your health care provider. Make sure you discuss  any questions you have with your health care provider.   Document Released: 08/18/2005 Document Revised: 07/30/2015 Document Reviewed: 07/18/2013 Elsevier Interactive Patient Education 2016 [place] is a term that is commonly used to refer to joint pain or joint disease. There are more than 100 types of arthritis. CAUSES The most common cause of this condition is wear and tear of a joint. Other causes include:  Gout.  Inflammation of a joint.  An infection of a joint.  Sprains and other injuries near the joint.  A drug reaction or allergic reaction. In some cases, the cause may not be known. SYMPTOMS The main symptom of this condition is pain in the joint with movement. Other symptoms include:  Redness, swelling, or stiffness at a joint.  Warmth coming from the joint.  Fever.  Overall feeling of illness. DIAGNOSIS This condition may be diagnosed with a physical exam and tests, including:  Blood tests.  Urine tests.  Imaging tests, such as MRI, X-rays, or a CT scan. Sometimes, fluid is removed from a joint for testing. TREATMENT Treatment for this condition may involve:  Treatment of the cause, if it is known.  Rest.  Raising (elevating) the joint.  Applying cold or hot packs to the joint.  Medicines to improve symptoms and reduce inflammation.  Injections of a steroid such as cortisone into the joint to help reduce pain and inflammation. Depending on the cause of your arthritis, you may need to make lifestyle changes  to reduce stress on your joint. These changes may include exercising more and losing weight. HOME CARE INSTRUCTIONS Medicines  Take over-the-counter and prescription medicines only as told by your health care provider.  Do not take aspirin to relieve pain if gout is suspected. Activities  Rest your joint if told by your health care provider. Rest is important when your disease is active and your joint feels painful,  swollen, or stiff.  Avoid activities that make the pain worse. It is important to balance activity with rest.  Exercise your joint regularly with range-of-motion exercises as told by your health care provider. Try doing low-impact exercise, such as:  Swimming.  Water aerobics.  Biking.  Walking. Joint Care  If your joint is swollen, keep it elevated if told by your health care provider.  If your joint feels stiff in the morning, try taking a warm shower.  If directed, apply heat to the joint. If you have diabetes, do not apply heat without permission from your health care provider.  Put a towel between the joint and the hot pack or heating pad.  Leave the heat on the area for 20-30 minutes.  If directed, apply ice to the joint:  Put ice in a plastic bag.  Place a towel between your skin and the bag.  Leave the ice on for 20 minutes, 2-3 times per day.  Keep all follow-up visits as told by your health care provider. This is important. SEEK MEDICAL CARE IF:  The pain gets worse.  You have a fever. SEEK IMMEDIATE MEDICAL CARE IF:  You develop severe joint pain, swelling, or redness.  Many joints become painful and swollen.  You develop severe back pain.  You develop severe weakness in your leg.  You cannot control your bladder or bowels.   This information is not intended to replace advice given to you by your health care provider. Make sure you discuss any questions you have with your health care provider.   Document Released: 12/16/2004 Document Revised: 07/30/2015 Document Reviewed: 02/03/2015 Elsevier Interactive Patient Education 2016 [employer]  Back Pain, Adult Back pain is very common in adults.The cause of back pain is rarely dangerous and the pain often gets better over time.The cause of your back pain may not be known. Some common causes of back pain include:  Strain of the muscles or ligaments supporting the spine.  Wear and tear  (degeneration) of the spinal disks.  Arthritis.  Direct injury to the back. For many people, back pain may return. Since back pain is rarely dangerous, most people can learn to manage this condition on their own. HOME CARE INSTRUCTIONS Watch your back pain for any changes. The following actions may help to lessen any discomfort you are feeling:  Remain active. It is stressful on your back to sit or stand in one place for long periods of time. Do not sit, drive, or stand in one place for more than 30 minutes at a time. Take short walks on even surfaces as soon as you are able.Try to increase the length of time you walk each day.  Exercise regularly as directed by your health care provider. Exercise helps your back heal faster. It also helps avoid future injury by keeping your muscles strong and flexible.  Do not stay in bed.Resting more than 1-2 days can delay your recovery.  Pay attention to your body when you bend and lift. The most comfortable positions are those that put less stress on your recovering  back. Always use proper lifting techniques, including:  Bending your knees.  Keeping the load close to your body.  Avoiding twisting.  Find a comfortable position to sleep. Use a firm mattress and lie on your side with your knees slightly bent. If you lie on your back, put a pillow under your knees.  Avoid feeling anxious or stressed.Stress increases muscle tension and can worsen back pain.It is important to recognize when you are anxious or stressed and learn ways to manage it, such as with exercise.  Take medicines only as directed by your health care provider. Over-the-counter medicines to reduce pain and inflammation are often the most helpful.Your health care provider may prescribe muscle relaxant drugs.These medicines help dull your pain so you can more quickly return to your normal activities and healthy exercise.  Apply ice to the injured area:  Put ice in a plastic  bag.  Place a towel between your skin and the bag.  Leave the ice on for 20 minutes, 2-3 times a day for the first 2-3 days. After that, ice and heat may be alternated to reduce pain and spasms.  Maintain a healthy weight. Excess weight puts extra stress on your back and makes it difficult to maintain good posture. SEEK MEDICAL CARE IF:  You have pain that is not relieved with rest or medicine.  You have increasing pain going down into the legs or buttocks.  You have pain that does not improve in one week.  You have night pain.  You lose weight.  You have a fever or chills. SEEK IMMEDIATE MEDICAL CARE IF:   You develop new bowel or bladder control problems.  You have unusual weakness or numbness in your arms or legs.  You develop nausea or vomiting.  You develop abdominal pain.  You feel faint.   This information is not intended to replace advice given to you by your health care provider. Make sure you discuss any questions you have with your health care provider.   Document Released: 11/08/2005 Document Revised: 11/29/2014 Document Reviewed: 03/12/2014 Elsevier Interactive Patient Education 2016 [employer]  Flank Pain Flank pain refers to pain that is located on the side of the body between the upper abdomen and the back. The pain may occur over a short period of time (acute) or may be long-term or reoccurring (chronic). It may be mild or severe. Flank pain can be caused by many things. CAUSES  Some of the more common causes of flank pain include:  Muscle strains.   Muscle spasms.   A disease of your spine (vertebral disk disease).   A lung infection (pneumonia).   Fluid around your lungs (pulmonary edema).   A kidney infection.   Kidney stones.   A very painful skin rash caused by the chickenpox virus (shingles).   Gallbladder disease.  Alicia care will depend on the cause of your pain. In general,  Rest as directed by  your caregiver.  Drink enough fluids to keep your urine clear or pale yellow.  Only take over-the-counter or prescription medicines as directed by your caregiver. Some medicines may help relieve the pain.  Tell your caregiver about any changes in your pain.  Follow up with your caregiver as directed. SEEK IMMEDIATE MEDICAL CARE IF:   Your pain is not controlled with medicine.   You have new or worsening symptoms.  Your pain increases.   You have abdominal pain.   You have shortness of breath.  You have persistent nausea or vomiting.   You have swelling in your abdomen.   You feel faint or pass out.   You have blood in your urine.  You have a fever or persistent symptoms for more than 2-3 days.  You have a fever and your symptoms suddenly get worse. MAKE SURE YOU:   Understand these instructions.  Will watch your condition.  Will get help right away if you are not doing well or get worse.   This information is not intended to replace advice given to you by your health care provider. Make sure you discuss any questions you have with your health care provider.   Document Released: 12/30/2005 Document Revised: 08/02/2012 Document Reviewed: 06/22/2012 Elsevier Interactive Patient Education 2016 [employer]  Musculoskeletal Pain Musculoskeletal pain is muscle and boney aches and pains. These pains can occur in any part of the body. Your caregiver may treat you without knowing the cause of the pain. They may treat you if blood or urine tests, X-rays, and other tests were normal.  CAUSES There is often not a definite cause or reason for these pains. These pains may be caused by a type of germ (virus). The discomfort may also come from overuse. Overuse includes working out too hard when your body is not fit. Boney aches also come from weather changes. Bone is sensitive to atmospheric pressure changes. HOME CARE INSTRUCTIONS   Ask when your test results will be  ready. Make sure you get your test results.  Only take over-the-counter or prescription medicines for pain, discomfort, or fever as directed by your caregiver. If you were given medications for your condition, do not drive, operate machinery or power tools, or sign legal documents for 24 hours. Do not drink alcohol. Do not take sleeping pills or other medications that may interfere with treatment.  Continue all activities unless the activities cause more pain. When the pain lessens, slowly resume normal activities. Gradually increase the intensity and duration of the activities or exercise.  During periods of severe pain, bed rest may be helpful. Lay or sit in any position that is comfortable.  Putting ice on the injured area.  Put ice in a bag.  Place a towel between your skin and the bag.  Leave the ice on for 15 to 20 minutes, 3 to 4 times a day.  Follow up with your caregiver for continued problems and no reason can be found for the pain. If the pain becomes worse or does not go away, it may be necessary to repeat tests or do additional testing. Your caregiver may need to look further for a possible cause. SEEK IMMEDIATE MEDICAL CARE IF:  You have pain that is getting worse and is not relieved by medications.  You develop chest pain that is associated with shortness or breath, sweating, feeling sick to your stomach (nauseous), or throw up (vomit).  Your pain becomes localized to the abdomen.  You develop any new symptoms that seem different or that concern you. MAKE SURE YOU:   Understand these instructions.  Will watch your condition.  Will get help right away if you are not doing well or get worse.   This information is not intended to replace advice given to you by your health care provider. Make sure you discuss any questions you have with your health care provider.   Document Released: 11/08/2005 Document Revised: 01/31/2012 Document Reviewed: 07/13/2013 Elsevier  Interactive Patient Education 2016 [employer]  Wrist Pain There are  many things that can cause wrist pain. Some common causes include:  An injury to the wrist area, such as a sprain, strain, or fracture.  Overuse of the joint.  A condition that causes increased pressure on a nerve in the wrist (carpal tunnel syndrome).  Wear and tear of the joints that occurs with aging (osteoarthritis).  A variety of other types of arthritis. Sometimes, the cause of wrist pain is not known. The pain often goes away when you follow your health care provider's instructions for relieving pain at home. If your wrist pain continues, tests may need to be done to diagnose your condition. HOME CARE INSTRUCTIONS Pay attention to any changes in your symptoms. Take these actions to help with your pain:  Rest the wrist area for at least 48 hours or as told by your health care provider.  If directed, apply ice to the injured area:  Put ice in a plastic bag.  Place a towel between your skin and the bag.  Leave the ice on for 20 minutes, 2-3 times per day.  Keep your arm raised (elevated) above the level of your heart while you are sitting or lying down.  If a splint or elastic bandage has been applied, use it as told by your health care provider.  Remove the splint or bandage only as told by your health care provider.  Loosen the splint or bandage if your fingers become numb or have a tingling feeling, or if they turn cold or blue.  Take over-the-counter and prescription medicines only as told by your health care provider.  Keep all follow-up visits as told by your health care provider. This is important. SEEK MEDICAL CARE IF:  Your pain is not helped by treatment.  Your pain gets worse. SEEK IMMEDIATE MEDICAL CARE IF:  Your fingers become swollen.  Your fingers turn white, very red, or cold and blue.  Your fingers are numb or have a tingling feeling.  You have difficulty moving your  fingers.   This information is not intended to replace advice given to you by your health care provider. Make sure you discuss any questions you have with your health care provider.   Document Released: 08/18/2005 Document Revised: 07/30/2015 Document Reviewed: 03/26/2015 Elsevier Interactive Patient Education 2016 [employer]  Pain Without a Known Cause WHAT IS PAIN WITHOUT A KNOWN CAUSE? Pain can occur in any part of the body and can range from mild to severe. Sometimes no cause can be found for why you are having pain. Some types of pain that can occur without a known cause include:   Headache.  Back pain.  Abdominal pain.  Neck pain. HOW IS PAIN WITHOUT A KNOWN CAUSE DIAGNOSED?  Your health care provider will try to find the cause of your pain. This may include:  Physical exam.  Medical history.  Blood tests.  Urine tests.  X-rays. If no cause is found, your health care provider may diagnose you with pain without a known cause.  IS THERE TREATMENT FOR PAIN WITHOUT A CAUSE?  Treatment depends on the kind of pain you have. Your health care provider may prescribe medicines to help relieve your pain.  WHAT CAN I DO AT HOME FOR MY PAIN?   Take medicines only as directed by your health care provider.  Stop any activities that cause pain. During periods of severe pain, bed rest may help.  Try to reduce your stress with activities such as yoga or meditation. Talk to your health  care provider for other stress-reducing activity recommendations.  Exercise regularly, if approved by your health care provider.  Eat a healthy diet that includes fruits and vegetables. This may improve pain. Talk to your health care provider if you have any questions about your diet. WHAT IF MY PAIN DOES NOT GET BETTER?  If you have a painful condition and no reason can be found for the pain or the pain gets worse, it is important to follow up with your health care provider. It may be necessary to  repeat tests and look further for a possible cause.    This information is not intended to replace advice given to you by your health care provider. Make sure you discuss any questions you have with your health care provider.   Document Released: 08/03/2001 Document Revised: 11/29/2014 Document Reviewed: 03/26/2014 Elsevier Interactive Patient Education [employer].

## 2015-11-27 NOTE — ED Provider Notes (Signed)
CSN: [id number]     Arrival date & time 11/27/15  1045 History   First MD Initiated Contact with Patient 11/27/15 1544     Chief Complaint  Patient presents with  . Wrist Pain  . Abdominal Pain     (Consider location/radiation/quality/duration/timing/severity/associated sxs/prior Treatment) Patient is a 75 y.o. male presenting with wrist pain and abdominal pain. The history is provided by the patient.  Wrist Pain This is a chronic problem. The current episode started more than 1 year ago. The problem occurs intermittently. The problem has been waxing and waning. Associated symptoms include abdominal pain, arthralgias and myalgias. Pertinent negatives include no anorexia, change in bowel habit, chest pain, chills, congestion, coughing, diaphoresis, fatigue, fever, headaches, joint swelling, nausea, neck pain, numbness, rash, sore throat, swollen glands, urinary symptoms, vertigo, visual change, vomiting or weakness. The symptoms are aggravated by bending, exertion and twisting. Treatments tried: pain meds. The treatment provided mild relief.  Abdominal Pain Pain location:  R flank and L flank Pain quality: aching   Pain quality: not bloating and not burning   Pain radiates to:  LLQ and RLQ Pain severity:  Moderate Onset quality:  Gradual Timing:  Intermittent Progression:  Waxing and waning Chronicity:  New Context: not medication withdrawal, not previous surgeries, not recent illness, not recent sexual activity, not recent travel, not sick contacts, not suspicious food intake and not trauma   Relieved by:  Nothing Worsened by:  Palpation and position changes Ineffective treatments:  Position changes, not moving, OTC medications and movement Associated symptoms: no anorexia, no chest pain, no chills, no constipation, no cough, no diarrhea, no dysuria, no fatigue, no fever, no hematemesis, no hematochezia, no hematuria, no melena, no nausea, no shortness of breath, no sore throat and no  vomiting   Risk factors: being elderly and obesity     Past Medical History  Diagnosis Date  . Colon polyp, hyperplastic   . Spondylolisthesis   . Hypertension   . Chronic systolic CHF (congestive heart failure) ([place])   . NICM (nonischemic cardiomyopathy) ([place])     a. Remote hx of dilated NICM with EF ranging 20-45%, including normal EF by echo (55-60%) in 2014.  Marland Kitchen CAD (coronary artery disease) 80% stenosis diag of the LAD, 30% in OM2 branch of LCX in 2009     a. Nonobstructive CAD by cath 11/2011 with the exception of the pre-existing diagonal branch #2 lesion.  Marland Kitchen PVC's (premature ventricular contractions)   . 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 Requiring discontinuation of BB dose.  . Myocardial infarction ([place]) 11/22/11  . Legally blind     "both eyes"  . CKD (chronic kidney disease) stage 3, GFR 30-59 ml/min   . History of stress test 06/01/2012    Normal myocardial perfusion study. compared to the previous study there is no significant change. this is a low risk scan  . Peripheral arterial disease ([place])     a. 06/2014: ABI right 0.99, left 1.2, LE dopplers revealing an occluded right posterior tibial. As symptoms were not felt r/t claudication, no further w/u at the time.  . Ventricular bigeminy   . Ventricular tachycardia (paroxysmal) ([place]) 04/11/2015   Past Surgical History  Procedure Laterality Date  . Cystoscopy    . Rotator cuff repair  2000's    left  . Back surgery    . Cardiac catheterization  11/2011  . Posterior fusion lumbar spine  1979  . Cataract extraction, bilateral  1990's  .  Ep study and ablation of vt  7/13    PVC focus mapped to the right coronary cusp of the aorta, limited ablation performed due to proximity of the focus to the right coronary artery  . Eye surgery    . Cystoscopy with urethral dilatation N/A 05/04/2013    Procedure: CYSTOSCOPY WITH URETHRAL DILATATION;  Surgeon: Eustace Moore, MD;  Location: [facility];  Service: Neurosurgery;   Laterality: N/A;  with insertion of foley catheter  . Cardiac catheterization  11/2011    didn't demonstrate high grade obstructive disease to account for his LV dysfunction.  . Left heart catheterization with coronary angiogram N/A 11/25/2011    Procedure: LEFT HEART CATHETERIZATION WITH CORONARY ANGIOGRAM;  Surgeon: Leonie Man, MD;  Location: [facility] CATH LAB;  Service: Cardiovascular;  Laterality: N/A;  . V-tach ablation N/A 06/06/2012    Procedure: V-TACH ABLATION;  Surgeon: Thompson Grayer, MD;  Location: [facility] CATH LAB;  Service: Cardiovascular;  Laterality: N/A;   Family History  Problem Relation Age of Onset  . Asthma Mother   . Other      Unsure if any heart disease in his family.   Social History  Substance Use Topics  . Smoking status: Former Smoker -- 50 years    Types: Cigarettes    Quit date: 11/23/1999  . Smokeless tobacco: Never Used  . 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  HENT: Negative for congestion and sore throat.   Eyes: Negative for pain.  Respiratory: Negative for cough and shortness of breath.   Cardiovascular: Negative for chest pain.  Gastrointestinal: Positive for abdominal pain. Negative for nausea, vomiting, diarrhea, constipation, melena, hematochezia, anorexia, change in bowel habit and hematemesis.  Genitourinary: Negative for dysuria and hematuria.  Musculoskeletal: Positive for myalgias and arthralgias. Negative for joint swelling and neck pain.  Skin: Negative for rash.  Neurological: Negative for vertigo, weakness, numbness and headaches.  Psychiatric/Behavioral: Negative for confusion.      Allergies  Review of patient's allergies indicates no known allergies.  Home Medications   Prior to Admission medications   Medication Sig Start Date End Date Taking? Authorizing Provider  acetaminophen  (TYLENOL) 325 MG tablet Take 650 mg by mouth every 6 (six) hours as needed for mild pain or headache.   Yes Historical Provider, MD  amLODipine (NORVASC) 10 MG tablet TAKE 1 TABLET BY MOUTH DAILY 10/20/15  Yes Troy Sine, MD  aspirin 325 MG tablet Take 81 mg by mouth daily.    Yes Historical Provider, MD  furosemide (LASIX) 20 MG tablet Take 20 mg by mouth daily. 01/22/15  Yes Historical Provider, MD  HYDROcodone-acetaminophen (NORCO/VICODIN) 5-325 MG per tablet Take 1-2 tablets by mouth every 4 (four) hours as needed for moderate pain. 02/19/15  Yes Donne Hazel, MD  Iron-Vitamins (GERITOL COMPLETE PO) Take 1 tablet by mouth daily.   Yes Historical Provider, MD  isosorbide mononitrate (IMDUR) 30 MG 24 hr tablet Take 1 tablet (30 mg total) by mouth daily. *Please schedule appointment for refills* 11/19/15  Yes Troy Sine, MD  LUMIGAN 0.01 % SOLN Place 1 drop into both eyes daily. 12/06/14  Yes Historical Provider, MD  nitroGLYCERIN (NITROSTAT) 0.4 MG SL tablet Place 1 tablet (0.4 mg total) under the tongue every 5 (five) minutes as needed for chest pain. 02/26/15  Yes Troy Sine, MD  potassium chloride SA (K-DUR,KLOR-CON) 20 MEQ tablet Take 20 mEq by mouth daily.   Yes Historical Provider, MD  pravastatin (PRAVACHOL) 40 MG tablet Take 40 mg by mouth daily. 02/13/15  Yes Historical Provider, MD  RESTASIS 0.05 % ophthalmic emulsion Place 1 drop into both eyes daily. 12/30/14  Yes Historical Provider, MD  spironolactone (ALDACTONE) 25 MG tablet Take 12.5 mg by mouth daily. 02/01/15  Yes Historical Provider, MD  oxyCODONE (ROXICODONE) 5 MG immediate release tablet Take 1 tablet (5 mg total) by mouth every 6 (six) hours as needed for breakthrough pain. 11/27/15   Esaw Grandchild, MD  pantoprazole (PROTONIX) 40 MG tablet Take 1 tablet (40 mg total) by mouth 2 (two) times daily before a meal. Take 1 tablet 2 times daily for 2 weeks, then 1 tablet daily. Patient not taking: Reported on 11/27/2015 09/19/15    Lorretta Harp, MD  spironolactone (ALDACTONE) 25 MG tablet Take 0.5 tablets (12.5 mg total) by mouth 2 (two) times daily. Please call to schedule appointment Patient not taking: Reported on 11/27/2015 08/04/15   Troy Sine, MD  timolol (TIMOPTIC-XR) 0.5 % ophthalmic gel-forming Place 1 drop into both eyes daily. 12/10/14   Historical Provider, MD   BP 136/93 mmHg  Pulse 61  Temp(Src) 97.8 F (36.6 C) (Oral)  Resp 18  SpO2 100% Physical Exam  Constitutional: He is oriented to person, place, and time. He appears well-developed and well-nourished. No distress.  HENT:  Head: Normocephalic and atraumatic.  Eyes: Conjunctivae and EOM are normal. Pupils are equal, round, and reactive to light.  Neck: Normal range of motion.  Cardiovascular: Normal rate and regular rhythm.  Exam reveals no gallop and no friction rub.   No murmur heard. Pulmonary/Chest: Effort normal and breath sounds normal. No respiratory distress. He has no wheezes. He has no rales. He exhibits no tenderness.  Abdominal: Soft. Normal appearance. He exhibits no distension, no ascites and no mass. There is tenderness in the right upper quadrant and right lower quadrant. There is no rigidity, no rebound, no guarding and no CVA tenderness. No hernia.  Musculoskeletal:       Right wrist: He exhibits tenderness. He exhibits normal range of motion, no swelling, no effusion, no crepitus and no deformity.       Thoracic back: He exhibits no tenderness, no bony tenderness, no edema, no deformity and no pain.       Lumbar back: He exhibits pain. He exhibits no bony tenderness, no edema and no deformity.  Neurological: He is alert and oriented to person, place, and time. No cranial nerve deficit. Coordination normal. GCS eye subscore is 4. GCS verbal subscore is 5. GCS motor subscore is 6.  Skin: Skin is warm and dry. No rash noted. He is not diaphoretic. No erythema. No pallor.  Psychiatric: He has a normal mood and affect. His behavior  is normal.    ED Course  Procedures (including critical care time) Labs Review Labs Reviewed  CBC WITH DIFFERENTIAL/PLATELET - Abnormal; Notable for the following:    Platelets 143 (*)    All other components within normal limits  BASIC METABOLIC PANEL - Abnormal; Notable for the following:    Glucose, Bld 106 (*)    Creatinine, Ser 1.56 (*)    GFR calc non Af Amer 41 (*)    GFR calc Af Amer 48 (*)    All other components within normal limits  URINALYSIS, ROUTINE W REFLEX MICROSCOPIC (  NOT AT [facility]) - Abnormal; Notable for the following:    Leukocytes, UA TRACE (*)    All other components within normal limits  URINE MICROSCOPIC-ADD ON - Abnormal; Notable for the following:    Squamous Epithelial / LPF 0-5 (*)    Bacteria, UA RARE (*)    All other components within normal limits    Imaging Review Dg Chest 2 View  11/27/2015  CLINICAL DATA:  Flank pain EXAM: CHEST - 2 VIEW COMPARISON:  04/09/2015 FINDINGS: Cardiac shadow is within normal limits. Mild interstitial changes noted bilaterally without focal infiltrate per sizable effusion. Previous surgical changes in the lower thoracic and lumbar spine are noted. No focal confluent infiltrate or sizable effusion is noted. IMPRESSION: No acute abnormality seen. Electronically Signed   By: Inez Catalina M.D.   On: 11/27/2015 16:53   Dg Wrist Complete Right  11/27/2015  CLINICAL DATA:  Chronic right wrist pain EXAM: RIGHT WRIST - COMPLETE 3+ VIEW COMPARISON:  None. FINDINGS: No acute fracture. No dislocation. There is deformity of the distal ulna compatible with healed fracture. IMPRESSION: No acute bony pathology.  Chronic change. Electronically Signed   By: Marybelle Killings M.D.   On: 11/27/2015 16:56   Ct Abdomen Pelvis W Contrast  11/27/2015  CLINICAL DATA:  One-day history of right flank pain. EXAM: CT ABDOMEN AND PELVIS WITH CONTRAST TECHNIQUE: Multidetector CT imaging of the abdomen and pelvis was performed using the standard protocol following  bolus administration of intravenous contrast. CONTRAST:  75 mL OMNIPAQUE IOHEXOL 300 MG/ML  SOLN COMPARISON:  None. FINDINGS: Lower chest: The lung bases are clear of acute process. No pulmonary lesions or pleural effusion. The heart is upper limits of normal in size for age. Three-vessel coronary artery calcifications are noted. The distal esophagus is grossly normal. Hepatobiliary: Mild intra and extrahepatic biliary dilatation, likely due to age and prior cholecystectomy. No focal hepatic lesions. Pancreas: No mass, inflammation or ductal dilatation. Spleen: Normal size.  No focal lesions. Adrenals/Urinary Tract: Both adrenal glands are normal. Right-sided renal calculi are noted. No hydronephrosis or obstructing ureteral calculi. No bladder calculi. Mild median lobe hypertrophy of the prostate gland impression on the base the bladder. No bladder mass. A simple left renal cyst is noted. No renal mass or evidence of acute inflammation/ infection. Stomach/Bowel: The stomach, duodenum, small bowel and colon are grossly normal without oral contrast. No inflammatory changes, mass lesions or obstructive findings. The terminal ileum is normal. The appendix is normal. Vascular/Lymphatic: No mesenteric or retroperitoneal mass or adenopathy. Moderate tortuosity, ectasia and calcification of the abdominal aorta. The major venous structures are patent. Other: No pelvic mass or adenopathy. No free pelvic fluid collections. No inguinal mass or adenopathy. Small scattered lymph nodes are noted. Musculoskeletal: Surgical changes involving the lumbar spine with wide decompressive laminectomies and fusion hardware. No acute bony findings. IMPRESSION: 1. Right renal calculi but no obstructing ureteral calculi or bladder calculi. 2. Mild intra and extrahepatic biliary dilatation due to age and prior cholecystectomy. 3. Mild median lobe hypertrophy of the prostate gland impresses on the base the bladder. No bladder or renal mass. 4.  Moderate atherosclerotic calcifications involving the aorta and iliac arteries and three-vessel coronary artery calcifications. Electronically Signed   By: Marijo Sanes M.D.   On: 11/27/2015 19:25   I have personally reviewed and evaluated these images and lab results as part of my medical decision-making.   EKG Interpretation None      MDM   Final diagnoses:  Right wrist pain  Arthritis  Flank pain  Bilateral low back pain without sciatica    75 year old black male with past medical history of coronary artery disease presents in the setting of low back pain, right wrist pain, bilateral flank pain. Patient reports this pain has been going on for many years but has worsened over the last several days. He reports is on the ulnar side is worsened with movement of wrist. He denies any trauma to the area or any redness or swelling. Patient reports chronic back pain has been hurting him since 1979 over the last several days she's had bilateral flank pain which radiates to the front of his abdomen. He reports is worse in the right side. He reports this is worse with exertion and palpation. Patient reports taking his home Norco without improvement in symptoms greatly. Patient reports compliance with all other medications. Patient denies any chest pain, shortness of breath, rash, fever, nausea, vomiting, conservation, diarrhea, dysuria. Denies history of kidney stones.  On examination patient has mild tenderness to right wrist without signs of warmth or inflammation. In setting of patient's history this is likely arthritis but will obtain x-ray to rule out further abnormality. Patient does have tenderness to palpation of bilateral flanks. No signs of rash or other abnormality. Due to bilateral nature believe shingles is less likely. Patient denies urinary symptoms and bleed nephrolithiasis or urinary tract infection is unlikely. Patient with elevation in creatinine but not significantly above baseline.  Patient had urinalysis performed without significant abnormality. No tenderness to palpation and mid abdomen and lipase within normal limits and bleed pancreatitis is unlikely. Patient without significant anemia or other white blood cell count. Believe infection is less likely. No significant liver abnormality is noted on laboratory analysis.  This is likely musculoskeletal pain however with patient's history of vascular disease we'll obtain CT scan of abdomen to rule out AAA or other significant intra-abdominal abnormality. Additional obtain chest x-ray to rule out lower lobe pneumonia. Will reevaluate after imaging.  Chest x-ray without signs of pneumonia or other acute cardiopulmonary abnormality. No signs of rib fractures. X-ray of wrist did not reveal acute abnormality. 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 Patient given pain medications.  CT scan did not reveal obstructing renal calculi, vertebral fracture, AAA or other acute intra-abdominal process that would explain patient's pain. On reassessment after Toradol patient has significant improvement in symptoms. In setting of these findings this is likely musculoskeletal pain in setting of chronic back pain. 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 Patient given short course of pain medication and advised to use NSAIDs at home. Patient given strict return precautions and advised to follow with PCP within one week for reevaluation. Patient stable at time of discharge and in agreement with plan.  Attending has seen and evaluated patient and Dr. Roxanne Mins is in agreement with plan.    Esaw Grandchild, MD [phone]  Delora Fuel, MD [phone]

## 2015-11-27 NOTE — ED Notes (Signed)
C/o lower back pain onset several weeks ago yest states pain radiated around both sides. States its hard to get comfortable. C/o right wrist pain. No  History of injury.

## 2015-11-28 ENCOUNTER — Ambulatory Visit (INDEPENDENT_AMBULATORY_CARE_PROVIDER_SITE_OTHER): Payer: Medicare Other | Admitting: Cardiovascular Disease

## 2015-11-28 VITALS — BP 134/76 | HR 62 | Ht 68.0 in | Wt 173.3 lb

## 2015-11-28 DIAGNOSIS — E785 Hyperlipidemia, unspecified: Secondary | ICD-10-CM

## 2015-11-28 DIAGNOSIS — I25810 Atherosclerosis of coronary artery bypass graft(s) without angina pectoris: Secondary | ICD-10-CM

## 2015-11-28 DIAGNOSIS — Z79899 Other long term (current) drug therapy: Secondary | ICD-10-CM | POA: Diagnosis not present

## 2015-11-28 DIAGNOSIS — I10 Essential (primary) hypertension: Secondary | ICD-10-CM

## 2015-11-28 DIAGNOSIS — I493 Ventricular premature depolarization: Secondary | ICD-10-CM

## 2015-11-28 DIAGNOSIS — I429 Cardiomyopathy, unspecified: Secondary | ICD-10-CM

## 2015-11-28 NOTE — Patient Instructions (Signed)
Your physician recommends that you return for lab work fasting.-DO  NOT EAT OR DRINK AFTER MIDNIGHT.  Your physician wants you to follow-up in: 6 months or sooner if needed. You will receive a reminder letter in the mail two months in advance. If you don't receive a letter, please call our office to schedule the follow-up appointment.  If you need a refill on your cardiac medications before your next appointment, please call your pharmacy.

## 2015-11-29 ENCOUNTER — Encounter: Payer: Self-pay | Admitting: Cardiovascular Disease

## 2015-11-29 NOTE — Progress Notes (Signed)
Patient ID: [name], male   DOB: 1941/07/11, 75 y.o.   MRN: [id number]      HPI: [name] is a 75 y.o. male who presents for 17 month followup cardiology evaluation.  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 In  December 2012 he presented with acute congestive heart failure requiring BiPAP therapy and during his hospitalization ejection fraction dropped to 20-25%. He ruled in for non-ST segment elevation MI and was not found to have high-grade obstructive CAD. He did wear life-vest transiently and with improvement of his ejection fraction to approximately 35-40% in April 2013 his life as was discontinued. He also has a history of intermittent second-degree AV block lead leading to discontinuance of his beta blocker therapy and reduction of his amiodarone dose. An echo Doppler study in March 2014 revealed an ejection fraction that had increased to approximately 55%. 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  When I last saw him  In August 2015 which time he was remaining fairly stable from a cardiovascular standpoint.   Review of his record indicates that he was hospitalized in May 2016 with chest pain.  His cardiac enzymes were negative for MI.  He had an exercise Myoview study on 04/10/2015 and 20.  The stress portion of the study had episodes of nonsustained VT.  Results were felt to be a low risk study.  He did not have chest pain or diagnostic ECG changes.  During that hospitalization a follow-up echo Doppler study showed an ejection fraction that had improved to 45-50% with inferior hypokinesis.  There was mild LVH and grade 1 diastolic dysfunction.  During his hospitalization he did have some bradycardia.  Since his hospitalization, he actually has felt well.  He denies chest pain or shortness of breath.  He denies presyncope or syncope.  He is unaware of any sustained arrhythmia  Past Medical History  Diagnosis Date  .  Colon polyp, hyperplastic   . Spondylolisthesis   . Hypertension   . Chronic systolic CHF (congestive heart failure) ([place])   . NICM (nonischemic cardiomyopathy) ([place])     a. Remote hx of dilated NICM with EF ranging 20-45%, including normal EF by echo (55-60%) in 2014.  Marland Kitchen CAD (coronary artery disease) 80% stenosis diag of the LAD, 30% in OM2 branch of LCX in 2009     a. Nonobstructive CAD by cath 11/2011 with the exception of the pre-existing diagonal branch #2 lesion.  Marland Kitchen PVC's (premature ventricular contractions)   . 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 Requiring discontinuation of BB dose.  . Myocardial infarction ([place]) 11/22/11  . Legally blind     "both eyes"  . CKD (chronic kidney disease) stage 3, GFR 30-59 ml/min   . History of stress test 06/01/2012    Normal myocardial perfusion study. compared to the previous study there is no significant change. this is a low risk scan  . Peripheral arterial disease ([place])     a. 06/2014: ABI right 0.99, left 1.2, LE dopplers revealing an occluded right posterior tibial. As symptoms were not felt r/t claudication, no further w/u at the time.  . Ventricular bigeminy   . Ventricular tachycardia (paroxysmal) ([place]) 04/11/2015    Past Surgical History  Procedure Laterality Date  . Cystoscopy    . Rotator cuff repair  2000's    left  . Back surgery    . Cardiac catheterization  11/2011  . Posterior fusion lumbar spine  1979  . Cataract extraction, bilateral  1990's  . Ep study and ablation of vt  7/13    PVC focus mapped to the right coronary cusp of the aorta, limited ablation performed due to proximity of the focus to the right coronary artery  . Eye surgery    . Cystoscopy with urethral dilatation N/A 05/04/2013    Procedure: CYSTOSCOPY WITH URETHRAL DILATATION;  Surgeon: Eustace Moore, MD;  Location: [facility];  Service: Neurosurgery;  Laterality: N/A;  with insertion of foley catheter  . Cardiac catheterization  11/2011    didn't  demonstrate high grade obstructive disease to account for his LV dysfunction.  . Left heart catheterization with coronary angiogram N/A 11/25/2011    Procedure: LEFT HEART CATHETERIZATION WITH CORONARY ANGIOGRAM;  Surgeon: Leonie Man, MD;  Location: [facility] CATH LAB;  Service: Cardiovascular;  Laterality: N/A;  . V-tach ablation N/A 06/06/2012    Procedure: V-TACH ABLATION;  Surgeon: Thompson Grayer, MD;  Location: [facility] CATH LAB;  Service: Cardiovascular;  Laterality: N/A;    No Known Allergies  Current Outpatient Prescriptions  Medication Sig Dispense Refill  . acetaminophen (TYLENOL) 325 MG tablet Take 650 mg by mouth every 6 (six) hours as needed for mild pain or headache.    Marland Kitchen amLODipine (NORVASC) 10 MG tablet TAKE 1 TABLET BY MOUTH DAILY 30 tablet 6  . aspirin 325 MG tablet Take 81 mg by mouth daily.     . furosemide (LASIX) 20 MG tablet Take 20 mg by mouth daily.  5  . HYDROcodone-acetaminophen (NORCO/VICODIN) 5-325 MG per tablet Take 1-2 tablets by mouth every 4 (four) hours as needed for moderate pain. 20 tablet 0  . Iron-Vitamins (GERITOL COMPLETE PO) Take 1 tablet by mouth daily.    . isosorbide mononitrate (IMDUR) 30 MG 24 hr tablet Take 1 tablet (30 mg total) by mouth daily. *Please schedule appointment for refills* 15 tablet 0  . LUMIGAN 0.01 % SOLN Place 1 drop into both eyes daily.  0  . nitroGLYCERIN (NITROSTAT) 0.4 MG SL tablet Place 1 tablet (0.4 mg total) under the tongue every 5 (five) minutes as needed for chest pain. 25 tablet 3  . oxyCODONE (ROXICODONE) 5 MG immediate release tablet Take 1 tablet (5 mg total) by mouth every 6 (six) hours as needed for breakthrough pain. 15 tablet 0  . potassium chloride SA (K-DUR,KLOR-CON) 20 MEQ tablet Take 20 mEq by mouth daily.    . pravastatin (PRAVACHOL) 40 MG tablet Take 40 mg by mouth daily.  3  . RESTASIS 0.05 % ophthalmic emulsion Place 1 drop into both eyes daily.  11  . spironolactone (ALDACTONE) 25 MG tablet Take 25 mg by mouth  daily.  6  . timolol (TIMOPTIC-XR) 0.5 % ophthalmic gel-forming Place 1 drop into both eyes daily.  5   No current facility-administered medications for this visit.    Social History   Social History  . Marital Status: Legally Separated    Spouse Name: N/A  . Number of Children: 3  . Years of Education: N/A   Occupational History  . Retired    Social History Main Topics  . Smoking status: Former Smoker -- 50 years    Types: Cigarettes    Quit date: 11/23/1999  . Smokeless tobacco: Never Used  . 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 Drug Use:  No  . Sexual Activity: Yes   Other Topics Concern  . Not on file   Social History Narrative    Socially, he is divorced and has 4 children, and 7 grandchildren.  ROS General: Negative; No fevers, chills, or night sweats;  HEENT: Negative; No changes in vision or hearing, sinus congestion, difficulty swallowing Pulmonary: Negative; No cough, wheezing, shortness of breath, hemoptysis Cardiovascular:  See history of present illness GI: He has a history of GERD, which is controlled with pantoprazole No nausea, vomiting, diarrhea, or abdominal pain GU: Negative; No dysuria, hematuria, or difficulty voiding Musculoskeletal: Negative; no myalgias, joint pain, or weakness Hematologic/Oncology: Negative; no easy bruising, bleeding Endocrine: Negative; no heat/cold intolerance; no diabetes Neuro: Negative; no changes in balance, headaches Skin: Negative; No rashes or skin lesions Psychiatric: Negative; No behavioral problems, depression Sleep: Negative; No snoring, daytime sleepiness, hypersomnolence, bruxism, restless legs, hypnogognic hallucinations, no cataplexy Other comprehensive 14 point system review is negative.   PE BP 134/76 mmHg  Pulse 62  Ht '5\' 8"'  (1.727 m)  Wt 173 lb 4.8 oz (78.608 kg)  BMI 26.36 kg/m2   Wt Readings from Last 3 Encounters:  11/28/15 173 lb  4.8 oz (78.608 kg)  04/11/15 169 lb 9.6 oz (76.93 kg)  02/19/15 166 lb 9.6 oz (75.569 kg)   General: Alert, oriented, no distress.  Skin: normal turgor, no rashes HEENT: Normocephalic, atraumatic. Pupils round and reactive; sclera anicteric;no lid lag.  Nose without nasal septal hypertrophy Mouth/Parynx benign; Mallinpatti scale 3 Chest wall: Nontender to palpation Neck: No JVD, no carotid bruits with normal carotid upstroke. Lungs: clear to ausculatation and percussion; no wheezing or rales Heart: RRR, s1 s2 normal 1/6 systolic murmur. No S3 or S4 gallop; no diastolic murmur. No rubs thrills or heaves Abdomen: soft, nontender; no hepatosplenomehaly, BS+; abdominal aorta nontender and not dilated by palpation. No CVA tenderness Pulses 2+ Extremities: no clubbing cyanosis or edema, Homan's sign negative  Neurologic: grossly nonfocal Psychological: Normal affect and mood  ECG (independently read by me):  Normal sinus rhythm at 62 bpm. First-degree AV block. Isolated PVC.  August 2015 ECG (independently read by me): Sinus rhythm at 61 beats per minute with a mild sinus arrhythmia and first-degree AV block.  Nonspecific ST change  Prior 01/01/2014 ECG (independently read by me): Sinus bradycardia with marked first-degree AV block with PR interval at 348 ms. QTc interval 457 ms. No significant ST-T changes.  Prior ECG from 06/27/2013: Normal sinus rhythm at 64 beats per minute with first-degree AV block. Voltaren 6 ms. QTC interval 464 ms.  LABS: BMP Latest Ref Rng 11/27/2015 04/10/2015 04/09/2015  Glucose 65 - 99 mg/dL 106(H) 120(H) 108(H)  BUN 6 - 20 mg/dL '18 17 15  ' Creatinine 0.61 - 1.24 mg/dL 1.56(H) 1.58(H) 1.45(H)  Sodium 135 - 145 mmol/L 142 138 140  Potassium 3.5 - 5.1 mmol/L 4.1 4.0 4.1  Chloride 101 - 111 mmol/L 109 104 107  CO2 22 - 32 mmol/L '26 24 27  ' Calcium 8.9 - 10.3 mg/dL 9.5 9.7 9.6   Hepatic Function Latest Ref Rng 02/18/2015 03/19/2013 11/27/2012  Total Protein 6.0 - 8.3  g/dL 6.7 8.5(H) 7.8  Albumin 3.5 - 5.2 g/dL 3.6 4.1 3.8  AST 0 - 37 U/L 21 33 22  ALT 0 - 53 U/L '16 22 25  ' Alk Phosphatase 39 - 117 U/L 64 64 57  Total Bilirubin 0.3 - 1.2 mg/dL 0.8 0.5 0.7  Bilirubin, Direct 0.0 - 0.3 mg/dL - 0.1 0.0  CBC Latest Ref Rng 11/27/2015 04/10/2015 04/09/2015  WBC 4.0 - 10.5 K/uL 4.2 4.4 4.7  Hemoglobin 13.0 - 17.0 g/dL 13.3 15.1 14.2  Hematocrit 39.0 - 52.0 % 39.8 43.6 40.3  Platelets 150 - 400 K/uL 143(L) 148(L) 150   Lab Results  Component Value Date   MCV 89.2 11/27/2015   MCV 86.9 04/10/2015   MCV 87.0 04/09/2015   Lab Results  Component Value Date   TSH 1.340 04/09/2015   Lab Results  Component Value Date   HGBA1C 5.9* 05/26/2012   Lipid Panel     Component Value Date/Time   CHOL 95 04/10/2015 0735   TRIG 45 04/10/2015 0735   HDL 41 04/10/2015 0735   CHOLHDL 2.3 04/10/2015 0735   VLDL 9 04/10/2015 0735   LDLCALC 45 04/10/2015 0735     RADIOLOGY: No results found.    ASSESSMENT AND PLAN: Mr. Mcmurtrey has a history of a nonischemic cardiomyopathy with an ejection fraction in the past at 25%. His LV function has normalized on current medical regimen and on last echo Doppler assessment was 55%. Presently, he is not having any signs of CHF or volume overload. His blood pressure is well controlled on amlodipine 10 mg furosemide 20 mg,  spironolactone 12.5 twice a day. Heis not having any chest pain symptoms on his current dose of 30 mg  isosorbide mononitrate. He does have significant first-degree AV block which has been present previously.   Remotely, his amiodarone was reduced and discontinued due to bradycardia and his heart block. I reviewed his hospitalization records from May 18 through Apr 11, 2015.  I also discussed with him his most recent echo Doppler study which showed improvement of his ejection fraction to 45-50%. I am recommending repeat laboratory be done on his current medical regimen.  I have recommended he take aspirin 81 mg.  He  will continue his current dose of pravastatin 40 mg.  As long as he remains stable I will see him in 6 months for reevaluation.   Time spent: 25 minutes Troy Sine, MD, [facility]  11/29/2015 12:58 PM

## 2015-12-02 ENCOUNTER — Other Ambulatory Visit: Payer: Self-pay | Admitting: Cardiovascular Disease

## 2015-12-02 NOTE — Telephone Encounter (Signed)
Rx request sent to pharmacy.  

## 2015-12-03 ENCOUNTER — Ambulatory Visit (INDEPENDENT_AMBULATORY_CARE_PROVIDER_SITE_OTHER): Payer: Medicare Other | Admitting: Podiatry

## 2015-12-03 ENCOUNTER — Encounter: Payer: Self-pay | Admitting: Podiatry

## 2015-12-03 DIAGNOSIS — M79676 Pain in unspecified toe(s): Secondary | ICD-10-CM

## 2015-12-03 DIAGNOSIS — B351 Tinea unguium: Secondary | ICD-10-CM | POA: Diagnosis not present

## 2015-12-03 NOTE — Progress Notes (Signed)
Patient ID: [name], male   DOB: 1941-02-13, 75 y.o.   MRN: [id number]  Subjective: This patient presents for a scheduled visit complaining of elongated and thickened toenails which are comfortable walking wearing shoes and request toenail debridement  Objective: Orientated 3 No open skin lesions bilaterally The toenails are hypertrophic, elongated, discolored, deformed and tender direct palpation 6-10  Assessment: Symptomatic onychomycoses 6-10 Peripheral arterial disease evaluated by Dr. Gwenlyn Found  Plan: Debridement toenails 6-10 mechanically and electronically without any bleeding  Reappoint 3 month

## 2015-12-04 LAB — CBC
HCT: 42.2 % (ref 39.0–52.0)
Hemoglobin: 14.5 g/dL (ref 13.0–17.0)
MCH: 30.5 pg (ref 26.0–34.0)
MCHC: 34.4 g/dL (ref 30.0–36.0)
MCV: 88.7 fL (ref 78.0–100.0)
MPV: 10.5 fL (ref 8.6–12.4)
PLATELETS: 155 10*3/uL (ref 150–400)
RBC: 4.76 MIL/uL (ref 4.22–5.81)
RDW: 12.6 % (ref 11.5–15.5)
WBC: 4.5 10*3/uL (ref 4.0–10.5)

## 2015-12-05 LAB — COMPREHENSIVE METABOLIC PANEL
ALT: 18 U/L (ref 9–46)
AST: 20 U/L (ref 10–35)
Albumin: 4 g/dL (ref 3.6–5.1)
Alkaline Phosphatase: 61 U/L (ref 40–115)
BUN: 17 mg/dL (ref 7–25)
CHLORIDE: 108 mmol/L (ref 98–110)
CO2: 28 mmol/L (ref 20–31)
CREATININE: 1.45 mg/dL — AB (ref 0.70–1.18)
Calcium: 9.5 mg/dL (ref 8.6–10.3)
GLUCOSE: 93 mg/dL (ref 65–99)
Potassium: 4.5 mmol/L (ref 3.5–5.3)
SODIUM: 139 mmol/L (ref 135–146)
TOTAL PROTEIN: 7.2 g/dL (ref 6.1–8.1)
Total Bilirubin: 0.6 mg/dL (ref 0.2–1.2)

## 2015-12-05 LAB — LIPID PANEL
CHOL/HDL RATIO: 2 ratio (ref <=5.0)
CHOLESTEROL: 83 mg/dL — AB (ref 125–200)
HDL: 42 mg/dL (ref >=40)
LDL CALC: 30 mg/dL (ref <130)
Triglycerides: 56 mg/dL (ref <150)
VLDL: 11 mg/dL (ref <30)

## 2015-12-05 LAB — TSH: TSH: 1.569 u[IU]/mL (ref 0.350–4.500)

## 2015-12-11 ENCOUNTER — Telehealth: Payer: Self-pay | Admitting: Cardiovascular Disease

## 2015-12-11 MED ORDER — FUROSEMIDE 20 MG PO TABS: 20.0000 mg | ORAL_TABLET | Freq: Every day | ORAL | Status: DC

## 2015-12-11 NOTE — Telephone Encounter (Signed)
°*  STAT* If patient is at the pharmacy, call can be transferred to refill team.   1. Which medications need to be refilled? (please list name of each medication and dose if known)? Furosemide   2. Which pharmacy/location (including street and city if local pharmacy) is medication to be sent to? [facility] on [address]   3. Do they need a 30 day or 90 day supply? [place]

## 2015-12-15 ENCOUNTER — Encounter: Payer: Self-pay | Admitting: *Deleted

## 2016-01-05 DIAGNOSIS — E785 Hyperlipidemia, unspecified: Secondary | ICD-10-CM | POA: Diagnosis not present

## 2016-01-05 DIAGNOSIS — I10 Essential (primary) hypertension: Secondary | ICD-10-CM | POA: Diagnosis not present

## 2016-01-05 DIAGNOSIS — E782 Mixed hyperlipidemia: Secondary | ICD-10-CM | POA: Diagnosis not present

## 2016-01-05 DIAGNOSIS — H42 Glaucoma in diseases classified elsewhere: Secondary | ICD-10-CM | POA: Diagnosis not present

## 2016-01-25 ENCOUNTER — Other Ambulatory Visit: Payer: Self-pay | Admitting: Cardiovascular Disease

## 2016-01-27 DIAGNOSIS — E785 Hyperlipidemia, unspecified: Secondary | ICD-10-CM | POA: Diagnosis not present

## 2016-01-27 DIAGNOSIS — I10 Essential (primary) hypertension: Secondary | ICD-10-CM | POA: Diagnosis not present

## 2016-02-26 ENCOUNTER — Other Ambulatory Visit: Payer: Self-pay

## 2016-02-26 ENCOUNTER — Emergency Department (HOSPITAL_COMMUNITY): Payer: Medicare Other

## 2016-02-26 ENCOUNTER — Emergency Department (HOSPITAL_COMMUNITY)
Admission: EM | Admit: 2016-02-26 | Discharge: 2016-02-26 | Disposition: A | Discharge status: Home / Self Care | Payer: Medicare Other | Attending: Emergency Medicine | Admitting: Emergency Medicine

## 2016-02-26 ENCOUNTER — Encounter (HOSPITAL_COMMUNITY): Payer: Self-pay | Admitting: *Deleted

## 2016-02-26 ENCOUNTER — Emergency Department (HOSPITAL_BASED_OUTPATIENT_CLINIC_OR_DEPARTMENT_OTHER)
Admit: 2016-02-26 | Discharge: 2016-02-26 | Disposition: A | Discharge status: Home / Self Care | Payer: Medicare Other | Attending: Emergency Medicine | Admitting: Emergency Medicine

## 2016-02-26 ENCOUNTER — Inpatient Hospital Stay (HOSPITAL_COMMUNITY): Admit: 2016-02-26 | Payer: Medicare Other

## 2016-02-26 DIAGNOSIS — M25561 Pain in right knee: Secondary | ICD-10-CM | POA: Diagnosis not present

## 2016-02-26 DIAGNOSIS — M79609 Pain in unspecified limb: Secondary | ICD-10-CM | POA: Diagnosis not present

## 2016-02-26 DIAGNOSIS — I252 Old myocardial infarction: Secondary | ICD-10-CM | POA: Insufficient documentation

## 2016-02-26 DIAGNOSIS — I129 Hypertensive chronic kidney disease with stage 1 through stage 4 chronic kidney disease, or unspecified chronic kidney disease: Secondary | ICD-10-CM | POA: Insufficient documentation

## 2016-02-26 DIAGNOSIS — I5022 Chronic systolic (congestive) heart failure: Secondary | ICD-10-CM | POA: Insufficient documentation

## 2016-02-26 DIAGNOSIS — Z7982 Long term (current) use of aspirin: Secondary | ICD-10-CM | POA: Insufficient documentation

## 2016-02-26 DIAGNOSIS — Z86010 Personal history of colonic polyps: Secondary | ICD-10-CM | POA: Diagnosis not present

## 2016-02-26 DIAGNOSIS — H548 Legal blindness, as defined in USA: Secondary | ICD-10-CM | POA: Diagnosis not present

## 2016-02-26 DIAGNOSIS — Z87891 Personal history of nicotine dependence: Secondary | ICD-10-CM | POA: Diagnosis not present

## 2016-02-26 DIAGNOSIS — N183 Chronic kidney disease, stage 3 (moderate): Secondary | ICD-10-CM | POA: Insufficient documentation

## 2016-02-26 DIAGNOSIS — M79661 Pain in right lower leg: Secondary | ICD-10-CM | POA: Diagnosis not present

## 2016-02-26 DIAGNOSIS — Z9889 Other specified postprocedural states: Secondary | ICD-10-CM | POA: Insufficient documentation

## 2016-02-26 DIAGNOSIS — I440 Atrioventricular block, first degree: Secondary | ICD-10-CM | POA: Diagnosis not present

## 2016-02-26 DIAGNOSIS — Z79899 Other long term (current) drug therapy: Secondary | ICD-10-CM | POA: Insufficient documentation

## 2016-02-26 DIAGNOSIS — M79604 Pain in right leg: Secondary | ICD-10-CM | POA: Insufficient documentation

## 2016-02-26 DIAGNOSIS — I441 Atrioventricular block, second degree: Secondary | ICD-10-CM | POA: Diagnosis not present

## 2016-02-26 LAB — BASIC METABOLIC PANEL
Anion gap: 7 (ref 5–15)
BUN: 15 mg/dL (ref 6–20)
CHLORIDE: 104 mmol/L (ref 101–111)
CO2: 26 mmol/L (ref 22–32)
CREATININE: 1.39 mg/dL — AB (ref 0.61–1.24)
Calcium: 9.7 mg/dL (ref 8.9–10.3)
GFR calc non Af Amer: 47 mL/min — ABNORMAL LOW (ref >60)
GFR, EST AFRICAN AMERICAN: 55 mL/min — AB (ref >60)
Glucose, Bld: 97 mg/dL (ref 65–99)
Potassium: 3.8 mmol/L (ref 3.5–5.1)
SODIUM: 137 mmol/L (ref 135–145)

## 2016-02-26 LAB — CBC
HCT: 41.8 % (ref 39.0–52.0)
HEMOGLOBIN: 14 g/dL (ref 13.0–17.0)
MCH: 29.5 pg (ref 26.0–34.0)
MCHC: 33.5 g/dL (ref 30.0–36.0)
MCV: 88.2 fL (ref 78.0–100.0)
Platelets: 132 10*3/uL — ABNORMAL LOW (ref 150–400)
RBC: 4.74 MIL/uL (ref 4.22–5.81)
RDW: 12.9 % (ref 11.5–15.5)
WBC: 5.9 10*3/uL (ref 4.0–10.5)

## 2016-02-26 MED ORDER — TRAMADOL HCL 50 MG PO TABS: 50.0000 mg | ORAL_TABLET | Freq: Four times a day (QID) | ORAL | Status: DC | PRN

## 2016-02-26 MED ORDER — ACETAMINOPHEN 325 MG PO TABS
650.0000 mg | ORAL_TABLET | Freq: Once | ORAL | Status: AC
  Administered 2016-02-26: 650 mg via ORAL
  Filled 2016-02-26: qty 2

## 2016-02-26 NOTE — Discharge Instructions (Signed)
It was our pleasure to provide your ER care today - we hope that you feel better.  Your ultrasound study was negative for dvt/clot.  Elevate leg.  Try heat therapy and/or massage of sore muscle area for symptom relief.  Take tylenol as need for pain.  You may also take ultram as need for pain - no driving when taking.   Follow up with your doctor in the coming week for recheck.   For slow heart rate/heart block - follow up with your cardiologist in the next 2-3 days - call office tomorrow morning to arrange that follow up with them.  Return to ER if worse, new symptoms, fevers, increased swelling, redness, weak/fainting, chest pain, trouble breathing, other concern.     Bradycardia Bradycardia is a slower-than-normal heart rate. A normal resting heart rate for an adult ranges from 60 to 100 beats per minute. With bradycardia, the resting heart rate is less than 60 beats per minute. Bradycardia is a problem if your heart cannot pump enough oxygen-rich blood through your body. Bradycardia is not a problem for everyone. For some healthy adults, a slow resting heart rate is normal.  CAUSES  Bradycardia may be caused by:  A problem with the heart's electrical system, such as heart block.  A problem with the heart's natural pacemaker (sinus node).  Heart disease, damage, or infection.  Certain medicines that treat heart conditions.  Certain conditions, such as hypothyroidism and obstructive sleep apnea. RISK FACTORS  Risk factors include:  Being 70 or older.  Having high blood pressure (hypertension), high cholesterol (hyperlipidemia), or diabetes.  Drinking heavily, using tobacco products, or using drugs.  Being stressed. SIGNS AND SYMPTOMS  Signs and symptoms include:  Light-headedness.  Faintingor near fainting.  Fatigue and weakness.  Shortness of breath.  Chest pain (angina).  Drowsiness.  Confusion.  Dizziness. DIAGNOSIS  Diagnosis of bradycardia may  include:  A physical exam.  An electrocardiogram (ECG).  Blood tests. TREATMENT  Treatment for bradycardia may include:  Treatment of an underlying condition.  Pacemaker placement. A pacemaker is a small, battery-powered device that is placed under the skin and is programmed to sense your heartbeats. If your heart rate is lower than the programmed rate, the pacemaker will pace your heart.  Changing your medicines or dosages. HOME CARE INSTRUCTIONS  Take medicines only as directed by your health care provider.  Manage any health conditions that contribute to bradycardia as directed by your health care provider.  Follow a heart-healthy diet. A dietitian can help educate you on healthy food options and changes.  Follow an exercise program approved by your health care provider.  Maintain a healthy weight. Lose weight as approved by your health care provider.  Do not use tobacco products, including cigarettes, chewing tobacco, or electronic cigarettes. If you need help quitting, ask your health care provider.  Do not use illegal drugs.  Limit alcohol intake to no more than 1 drink per day for nonpregnant women and 2 drinks per day for men. One drink equals 12 ounces of beer, 5 ounces of wine, or 1 ounces of hard liquor.  Keep all follow-up visits as directed by your health care provider. This is important. SEEK MEDICAL CARE IF:  You feel light-headed or dizzy.  You almost faint.  You feel weak or are easily fatigued during physical activity.  You experience confusion or have memory problems. SEEK IMMEDIATE MEDICAL CARE IF:   You faint.  You have an irregular heartbeat.  You have  chest pain.  You have trouble breathing. MAKE SURE YOU:   Understand these instructions.  Will watch your condition.  Will get help right away if you are not doing well or get worse.   This information is not intended to replace advice given to you by your health care provider. Make  sure you discuss any questions you have with your health care provider.   Document Released: 07/31/2002 Document Revised: 11/29/2014 Document Reviewed: 02/13/2014 Elsevier Interactive Patient Education 2016 [employer]       Second-Degree Atrioventricular Block Second-degree atrioventricular (AV) block is a type of heart block. About Heart Block Heart block is a problem with the system that controls how often the heart beats (heart rate). If you have heart block, the electrical signals that regulate your heart rate are slowed or interrupted. Normal Heart Action The heart has two upper chambers (atria) and two lower chambers (ventricles). They work together to pump blood to the body. The heartbeat starts in an upper area of the right atrium (sinoatrial node, or SA node). This is the heart's natural pacemaker. The SA node sends electrical signals that pass through another node (atrioventricular node, or AV node), which is located between the atria and ventricles. Next, the signals travel through the ventricles on conduction pathways. As the signals pass down these pathways, the ventricles contract and send blood out to the body. About Second-Degree AV Block Second-degree heart block is a serious condition. If you have second-degree AV block, the signals are slowed or interrupted while they travel through the heart. You may start missing beats. Eventually, you may develop complete heart block and may need an artificial pacemaker. There are two types of second-degree AV block:  Mobitz type 1 block usually does not cause symptoms. It rarely requires treatment.  Mobitz type 2 block is more serious. This type may:  Cause more missed heartbeats.  Make the missed beats very irregular.  Lead to complete heart block. This is a dangerous condition that requires emergency treatment. CAUSES In some cases, a person is born with heart block. More often, the condition develops over time.  Second-degree heart block may be caused by:  Any condition that damages the heart's conducting system.  Some medicines that slow down the heart rate. RISK FACTORS The risk for this condition increases with age. It is also more likely to develop in people who have any of the following conditions:  A heart attack in the past.  Heart failure.  Coronary heart disease.  Inflammation of heart muscle (myocarditis).  Disease of heart muscle (cardiomyopathy).  Infection of the heart valves (endocarditis).  Infections or diseases that affect the heart. These include:  Lyme disease.  Sarcoidosis.  Hemochromatosis.  Rheumatic fever. SYMPTOMS Symptoms depend on the type of second-degree AV block that you have. Type 1 block may not cause any symptoms. Symptoms of type 2 block include:   Fatigue.  Shortness of breath.  Dizziness or light-headedness.  Fainting.  Chest pain. DIAGNOSIS This condition may be diagnosed during a routine physical exam. Your health care provider may suspect a heart block if you have a slow pulse or heartbeat. You may have tests to confirm the diagnosis and to rule out other conditions. These tests may include:  An electrocardiogram (ECG) to check for problems with the electrical activity in your heart.  Wearing a portable ECG device for a few days (Holter monitor) or a few weeks (event monitor). This is done to monitor your heart rate over time.  An electrophysiology (EP) study to test the electrical pathway of your heart. A specialist will place long, thin tubes (catheters) in your heart. The catheters are used to study your heart and record the electrical signals from your heart. TREATMENT Treatment for second-degree AV block depends on the type of heart block that you have and how bad it is.  Type 1: You may not need treatment.  Type 2: You may need to get a permanent pacemaker. You may also need treatment for another condition that is causing  heart block. It may also be necessary to change or stop taking any heart medicines that could cause heart block. HOME CARE INSTRUCTIONS  Take over-the-counter and prescription medicines only as told by your health care provider.  Work with your health care providers to control all of your risks for heart disease. This may include following these instructions:  Get regular exercise. Ask your health care provider what type of exercise is safe for you.  Eat a heart-healthy diet. Your health care provider or dietitian can help you make healthy choices.  Maintain a healthy weight.  Do not use any tobacco products, including cigarettes, chewing tobacco, or e-cigarettes. If you need help quitting, ask your health care provider.  Limit alcohol intake to no more than 1 drink per day for nonpregnant women and 2 drinks per day for men. One drink equals 12 oz of beer, 5 oz of wine, or 1 oz of hard liquor.  Keep all follow-up visits as told by your health care provider. This is important. SEEK MEDICAL CARE IF:  Your symptoms change or get worse.  You feel as though your heart is skipping beats.  You feel more tired than normal.  You have swelling in your lower legs. SEEK IMMEDIATE MEDICAL CARE IF:  You have chest pain, especially if the pain:  Feels like crushing or pressure.  Spreads to your arms, back, neck, or jaw.  You feel short of breath.  You feel light-headed or weak.  You faint.   This information is not intended to replace advice given to you by your health care provider. Make sure you discuss any questions you have with your health care provider.   Document Released: 10/21/2008 Document Revised: 03/25/2015 Document Reviewed: 10/09/2014 Elsevier Interactive Patient Education 2016 [place] therapy can help ease sore, stiff, injured, and tight muscles and joints. Heat relaxes your muscles, which may help ease your pain. Heat therapy should only be  used on old, pre-existing, or long-lasting (chronic) injuries. Do not use heat therapy unless told by your doctor. HOW TO USE HEAT THERAPY There are several different kinds of heat therapy, including:  Moist heat pack.  Warm water bath.  Hot water bottle.  Electric heating pad.  Heated gel pack.  Heated wrap.  Electric heating pad. GENERAL HEAT THERAPY RECOMMENDATIONS   Do not sleep while using heat therapy. Only use heat therapy while you are awake.  Your skin may turn pink while using heat therapy. Do not use heat therapy if your skin turns red.  Do not use heat therapy if you have new pain.  High heat or long exposure to heat can cause burns. Be careful when using heat therapy to avoid burning your skin.  Do not use heat therapy on areas of your skin that are already irritated, such as with a rash or sunburn. GET HELP IF:   You have blisters, redness, swelling (puffiness), or numbness.  You have new  pain.  Your pain is worse. MAKE SURE YOU:  Understand these instructions.  Will watch your condition.  Will get help right away if you are not doing well or get worse.   This information is not intended to replace advice given to you by your health care provider. Make sure you discuss any questions you have with your health care provider.   Document Released: 01/31/2012 Document Revised: 11/29/2014 Document Reviewed: 01/01/2014 Elsevier Interactive Patient Education 2016 Else[employer] Inc.    Muscle Pain, Adult Muscle pain (myalgia) may be caused by many things, including:  Overuse or muscle strain, especially if you are not in shape. This is the most common cause of muscle pain.  Injury.  Bruises.  Viruses, such as the flu.  Infectious diseases.  Fibromyalgia, which is a chronic condition that causes muscle tenderness, fatigue, and headache.  Autoimmune diseases, including lupus.  Certain drugs, including ACE inhibitors and statins. Muscle pain may be  mild or severe. In most cases, the pain lasts only a short time and goes away without treatment. To diagnose the cause of your muscle pain, your health care provider will take your medical history. This means he or she will ask you when your muscle pain began and what has been happening. If you have not had muscle pain for very long, your health care provider may want to wait before doing much testing. If your muscle pain has lasted a long time, your health care provider may want to run tests right away. If your health care provider thinks your muscle pain may be caused by illness, you may need to have additional tests to rule out certain conditions.  Treatment for muscle pain depends on the cause. Home care is often enough to relieve muscle pain. Your health care provider may also prescribe anti-inflammatory medicine. HOME CARE INSTRUCTIONS Watch your condition for any changes. The following actions may help to lessen any discomfort you are feeling:  Only take over-the-counter or prescription medicines as directed by your health care provider.  Apply ice to the sore muscle:  Put ice in a plastic bag.  Place a towel between your skin and the bag.  Leave the ice on for 15-20 minutes, 3-4 times a day.  You may alternate applying hot and cold packs to the muscle as directed by your health care provider.  If overuse is causing your muscle pain, slow down your activities until the pain goes away.  Remember that it is normal to feel some muscle pain after starting a workout program. Muscles that have not been used often will be sore at first.  Do regular, gentle exercises if you are not usually active.  Warm up before exercising to lower your risk of muscle pain.  Do not continue working out if the pain is very bad. Bad pain could mean you have injured a muscle. SEEK MEDICAL CARE IF:  Your muscle pain gets worse, and medicines do not help.  You have muscle pain that lasts longer than 3  days.  You have a rash or fever along with muscle pain.  You have muscle pain after a tick bite.  You have muscle pain while working out, even though you are in good physical condition.  You have redness, soreness, or swelling along with muscle pain.  You have muscle pain after starting a new medicine or changing the dose of a medicine. SEEK IMMEDIATE MEDICAL CARE IF:  You have trouble breathing.  You have trouble swallowing.  You have  muscle pain along with a stiff neck, fever, and vomiting.  You have severe muscle weakness or cannot move part of your body. MAKE SURE YOU:   Understand these instructions.  Will watch your condition.  Will get help right away if you are not doing well or get worse.   This information is not intended to replace advice given to you by your health care provider. Make sure you discuss any questions you have with your health care provider.   Document Released: 09/30/2006 Document Revised: 11/29/2014 Document Reviewed: 09/04/2013 Elsevier Interactive Patient Education [employer].

## 2016-02-26 NOTE — Progress Notes (Deleted)
VASCULAR LAB PRELIMINARY  PRELIMINARY  PRELIMINARY  PRELIMINARY  Right lower extremity venous duplex completed.    Preliminary report:  Right:  No evidence of DVT, superficial thrombosis, or Baker's cyst.  Sharion Dove, RVS 02/26/2016, 03:10 PM  Autumm Hattery, RVS 02/26/2016, 7:09 PM

## 2016-02-26 NOTE — ED Notes (Signed)
VAS Tech reported that results from early VAS [place]  is already placed on EPIC for MD to review, MD to be notified.

## 2016-02-26 NOTE — ED Notes (Signed)
Pt in from home c/o R leg pain onset x 1 mth, pt has redness, swelling, & warm to touch to R calf, pt reports pain with ambulation, pt A&O x4, follows commands, speaks in complete sentences

## 2016-02-26 NOTE — ED Provider Notes (Addendum)
CSN: [id number]     Arrival date & time 02/26/16  1250 History   First MD Initiated Contact with Patient 02/26/16 1720     Chief Complaint  Patient presents with  . Leg Pain     (Consider location/radiation/quality/duration/timing/severity/associated sxs/prior Treatment) Patient is a 75 y.o. male presenting with leg pain. The history is provided by the patient.  Leg Pain Associated symptoms: no back pain, no fever and no neck pain   Patient c/o right lower leg pain for the past month. Pain gradual onset, constant, dull, moderate, without acute or abrupt change today. No skin changes, lesions or redness. States had fallen about a week ago, denies other trauma to area. No numbness or weakness. No fever or chills. Denies feeling sick or ill. No chest pain or sob. States sometimes has pain in right knee, ?hx arthritis. No hx bakers cyst. States pain is mainly in the right calf area. Denies claudication.       Past Medical History  Diagnosis Date  . Colon polyp, hyperplastic   . Spondylolisthesis   . Hypertension   . Chronic systolic CHF (congestive heart failure) ([place])   . NICM (nonischemic cardiomyopathy) ([place])     a. Remote hx of dilated NICM with EF ranging 20-45%, including normal EF by echo (55-60%) in 2014.  Marland Kitchen CAD (coronary artery disease) 80% stenosis diag of the LAD, 30% in OM2 branch of LCX in 2009     a. Nonobstructive CAD by cath 11/2011 with the exception of the pre-existing diagonal branch #2 lesion.  Marland Kitchen PVC's (premature ventricular contractions)   . 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 Requiring discontinuation of BB dose.  . Myocardial infarction ([place]) 11/22/11  . Legally blind     "both eyes"  . CKD (chronic kidney disease) stage 3, GFR 30-59 ml/min   . History of stress test 06/01/2012    Normal myocardial perfusion study. compared to the previous study there is no significant change. this is a low risk scan  . Peripheral arterial disease ([place])     a. 06/2014:  ABI right 0.99, left 1.2, LE dopplers revealing an occluded right posterior tibial. As symptoms were not felt r/t claudication, no further w/u at the time.  . Ventricular bigeminy   . Ventricular tachycardia (paroxysmal) ([place]) 04/11/2015   Past Surgical History  Procedure Laterality Date  . Cystoscopy    . Rotator cuff repair  2000's    left  . Back surgery    . Cardiac catheterization  11/2011  . Posterior fusion lumbar spine  1979  . Cataract extraction, bilateral  1990's  . Ep study and ablation of vt  7/13    PVC focus mapped to the right coronary cusp of the aorta, limited ablation performed due to proximity of the focus to the right coronary artery  . Eye surgery    . Cystoscopy with urethral dilatation N/A 05/04/2013    Procedure: CYSTOSCOPY WITH URETHRAL DILATATION;  Surgeon: Eustace Moore, MD;  Location: [facility];  Service: Neurosurgery;  Laterality: N/A;  with insertion of foley catheter  . Cardiac catheterization  11/2011    didn't demonstrate high grade obstructive disease to account for his LV dysfunction.  . Left heart catheterization with coronary angiogram N/A 11/25/2011    Procedure: LEFT HEART CATHETERIZATION WITH CORONARY ANGIOGRAM;  Surgeon: Leonie Man, MD;  Location: [facility] CATH LAB;  Service: Cardiovascular;  Laterality: N/A;  . V-tach ablation N/A 06/06/2012  Procedure: V-TACH ABLATION;  Surgeon: Thompson Grayer, MD;  Location: [facility] CATH LAB;  Service: Cardiovascular;  Laterality: N/A;   Family History  Problem Relation Age of Onset  . Asthma Mother   . Other      Unsure if any heart disease in his family.   Social History  Substance Use Topics  . Smoking status: Former Smoker -- 50 years    Types: Cigarettes    Quit date: 11/23/1999  . Smokeless tobacco: Never Used  . 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  HENT: Negative for sore throat.    Eyes: Negative for redness.  Respiratory: Negative for cough and shortness of breath.   Cardiovascular: Negative for chest pain.  Gastrointestinal: Negative for vomiting, abdominal pain and diarrhea.  Genitourinary: Negative for flank pain.  Musculoskeletal: Negative for back pain and neck pain.  Skin: Negative for rash.  Neurological: Negative for weakness, numbness and headaches.  Hematological: Does not bruise/bleed easily.  Psychiatric/Behavioral: Negative for confusion.      Allergies  Review of patient's allergies indicates no known allergies.  Home Medications   Prior to Admission medications   Medication Sig Start Date End Date Taking? Authorizing Provider  acetaminophen (TYLENOL) 325 MG tablet Take 650 mg by mouth every 6 (six) hours as needed for mild pain or headache.    Historical Provider, MD  amLODipine (NORVASC) 10 MG tablet TAKE 1 TABLET BY MOUTH DAILY 10/20/15   Troy Sine, MD  aspirin 325 MG tablet Take 81 mg by mouth daily.     Historical Provider, MD  furosemide (LASIX) 20 MG tablet Take 1 tablet (20 mg total) by mouth daily. 12/11/15   Troy Sine, MD  HYDROcodone-acetaminophen (NORCO/VICODIN) 5-325 MG per tablet Take 1-2 tablets by mouth every 4 (four) hours as needed for moderate pain. 02/19/15   Donne Hazel, MD  Iron-Vitamins (GERITOL COMPLETE PO) Take 1 tablet by mouth daily.    Historical Provider, MD  isosorbide mononitrate (IMDUR) 30 MG 24 hr tablet TAKE 1 TABLET(30 MG) BY MOUTH DAILY 12/02/15   Troy Sine, MD  LUMIGAN 0.01 % SOLN Place 1 drop into both eyes daily. 12/06/14   Historical Provider, MD  nitroGLYCERIN (NITROSTAT) 0.4 MG SL tablet Place 1 tablet (0.4 mg total) under the tongue every 5 (five) minutes as needed for chest pain. 02/26/15   Troy Sine, MD  oxyCODONE (ROXICODONE) 5 MG immediate release tablet Take 1 tablet (5 mg total) by mouth every 6 (six) hours as needed for breakthrough pain. 11/27/15   Esaw Grandchild, MD  potassium  chloride SA (K-DUR,KLOR-CON) 20 MEQ tablet Take 20 mEq by mouth daily.    Historical Provider, MD  pravastatin (PRAVACHOL) 40 MG tablet Take 40 mg by mouth daily. 02/13/15   Historical Provider, MD  RESTASIS 0.05 % ophthalmic emulsion Place 1 drop into both eyes daily. 12/30/14   Historical Provider, MD  spironolactone (ALDACTONE) 25 MG tablet Take 25 mg by mouth daily. 02/01/15   Historical Provider, MD  spironolactone (ALDACTONE) 25 MG tablet TAKE 1/2 TABLET BY MOUTH TWICE DAILY 01/26/16   Troy Sine, MD  timolol (TIMOPTIC-XR) 0.5 % ophthalmic gel-forming Place 1 drop into both eyes daily. 12/10/14   Historical Provider, MD   BP 131/68 mmHg  Pulse 60  Temp(Src) 97.9 F (36.6 C) (Oral)  Resp 19  Ht 5\' 8"  (1.727 m)  Wt 79.861 kg  BMI 26.78 kg/m2  SpO2 100% Physical Exam  Constitutional: He is oriented to person, place, and time. He appears well-developed and well-nourished. No distress.  HENT:  Head: Atraumatic.  Mouth/Throat: Oropharynx is clear and moist.  Eyes: Conjunctivae are normal. No scleral icterus.  Neck: Neck supple. No tracheal deviation present.  Cardiovascular: Normal rate, regular rhythm, normal heart sounds and intact distal pulses.   Pulmonary/Chest: Effort normal and breath sounds normal. No accessory muscle usage. No respiratory distress.  Abdominal: Soft. Bowel sounds are normal. He exhibits no distension. There is no tenderness.  Musculoskeletal: Normal range of motion. He exhibits no edema or tenderness.  Good passive rom right hip, knee and ankle without pain. No knee effusion. No swelling, erythema, increased warmth to leg. Dp/pt 2+ bil. Mild calf tenderness, no sig swelling noted. Skin intact.   Neurological: He is alert and oriented to person, place, and time.  RLE motor 5/5. sens grossly intact.   Skin: Skin is warm and dry. He is not diaphoretic.  Psychiatric: He has a normal mood and affect.  Nursing note and vitals reviewed.   ED Course  Procedures  (including critical care time)   Results for orders placed or performed during the hospital encounter of 02/26/16  CBC  Result Value Ref Range   WBC 5.9 4.0 - 10.5 K/uL   RBC 4.74 4.22 - 5.81 MIL/uL   Hemoglobin 14.0 13.0 - 17.0 g/dL   HCT 41.8 39.0 - 52.0 %   MCV 88.2 78.0 - 100.0 fL   MCH 29.5 26.0 - 34.0 pg   MCHC 33.5 30.0 - 36.0 g/dL   RDW 12.9 11.5 - 15.5 %   Platelets 132 (L) 150 - 400 K/uL  Basic metabolic panel  Result Value Ref Range   Sodium 137 135 - 145 mmol/L   Potassium 3.8 3.5 - 5.1 mmol/L   Chloride 104 101 - 111 mmol/L   CO2 26 22 - 32 mmol/L   Glucose, Bld 97 65 - 99 mg/dL   BUN 15 6 - 20 mg/dL   Creatinine, Ser 1.39 (H) 0.61 - 1.24 mg/dL   Calcium 9.7 8.9 - 10.3 mg/dL   GFR calc non Af Amer 47 (L) >60 mL/min   GFR calc Af Amer 55 (L) >60 mL/min   Anion gap 7 5 - 15   Dg Tibia/fibula Right  02/26/2016  CLINICAL DATA:  Nontraumatic right lower extremity pain for over 1 year. The pain is in the posterior right calf and anterior knee. EXAM: RIGHT TIBIA AND FIBULA - 2 VIEW COMPARISON:  None. FINDINGS: Negative for fracture, dislocation or radiopaque foreign body. There is no bone lesion or bony destruction. No acute soft tissue abnormality. IMPRESSION: Negative. Electronically Signed   By: Andreas Newport M.D.   On: 02/26/2016 18:34   Dg Knee Complete 4 Views Right  02/26/2016  CLINICAL DATA:  Nontraumatic right lower extremity pain for over 1 year. The pain is in the posterior right calf and anterior knee. EXAM: RIGHT KNEE - COMPLETE 4+ VIEW COMPARISON:  None. FINDINGS: Negative for fracture, dislocation or radiopaque foreign body. There is no bone lesion or bony destruction. No significant arthritic changes are evident. No acute soft tissue abnormalities evident. IMPRESSION: Negative. Electronically Signed   By: Andreas Newport M.D.   On: 02/26/2016 18:33        Dg Tibia/fibula Right  02/26/2016  CLINICAL DATA:  Nontraumatic right lower extremity pain for  over 1 year. The pain is in the posterior right calf and anterior knee. EXAM: RIGHT TIBIA AND FIBULA - 2 VIEW COMPARISON:  None. FINDINGS: Negative for fracture, dislocation or radiopaque foreign body. There is no bone lesion or bony destruction. No acute soft tissue abnormality. IMPRESSION: Negative. Electronically Signed   By: Andreas Newport M.D.   On: 02/26/2016 18:34   Dg Knee Complete 4 Views Right  02/26/2016  CLINICAL DATA:  Nontraumatic right lower extremity pain for over 1 year. The pain is in the posterior right calf and anterior knee. EXAM: RIGHT KNEE - COMPLETE 4+ VIEW COMPARISON:  None. FINDINGS: Negative for fracture, dislocation or radiopaque foreign body. There is no bone lesion or bony destruction. No significant arthritic changes are evident. No acute soft tissue abnormalities evident. IMPRESSION: Negative. Electronically Signed   By: Andreas Newport M.D.   On: 02/26/2016 18:33       Patient Name Sex DOB SSN    Stevens, Rodney Male 1941/07/31 [id number]    Progress Notes by Dareen Piano at 02/26/2016 7:09 PM    Author: Dareen Piano Service: Vascular Lab Author Type: Cardiovascular Sonographer   Filed: 02/26/2016 7:12 PM Note Time: 02/26/2016 7:09 PM Status: Deleted by Dareen Piano at 02/26/2016 7:12 PM   Editor: Vilma Prader Slaughter (Cardiovascular Sonographer)     Expand All Collapse All   VASCULAR LAB PRELIMINARY PRELIMINARY PRELIMINARY PRELIMINARY  Right lower extremity venous duplex completed.   Preliminary report: Right: No evidence of DVT, superficial thrombosis, or Baker's cyst.  Sharion Dove, RVS 02/26/2016, 03:10 PM  SLAUGHTER, [place], RVS 02/26/2016, 7:09 PM       I have personally reviewed and evaluated these images and lab results as part of my medical decision-making.    MDM   Vascular doppler.  Reviewed nursing notes and prior charts for additional history.   Tylenol po.  On exam, there is no redness,  increased warmth, or gross swelling noted. 2+dp/pt pulses. No pain w passive rom at hip knee or ankle.   Vascular study negative.  On monitor, pt with hr as low as mid 40's, asymptomatic.  Pt denies faintness/dizziness or any recent history syncope.  Pt denies chest pain or sob.  On review prior records noted with similar rhythm issues 03/2015.  Remains asymptomatic w bp normal.  Will refer to close outpatient follow up with his cardiologist.   Pt appears stable for d/c.   Patient requests pain med rx for home.  Will provide.   Return precautions provided.         Lajean Saver, MD 02/26/16 2220

## 2016-03-03 ENCOUNTER — Encounter: Payer: Self-pay | Admitting: Podiatry

## 2016-03-03 ENCOUNTER — Ambulatory Visit (INDEPENDENT_AMBULATORY_CARE_PROVIDER_SITE_OTHER): Payer: Medicare Other | Admitting: Podiatry

## 2016-03-03 DIAGNOSIS — M79676 Pain in unspecified toe(s): Secondary | ICD-10-CM

## 2016-03-03 DIAGNOSIS — B351 Tinea unguium: Secondary | ICD-10-CM

## 2016-03-03 NOTE — Patient Instructions (Signed)
Today your also complaining of some right heel pain that seems to be most consistent with this diagnosis below. If the symptoms or become more uncomfortable call office for further evaluation  Plantar Fasciitis Plantar fasciitis is a painful foot condition that affects the heel. It occurs when the band of tissue that connects the toes to the heel bone (plantar fascia) becomes irritated. This can happen after exercising too much or doing other repetitive activities (overuse injury). The pain from plantar fasciitis can range from mild irritation to severe pain that makes it difficult for you to walk or move. The pain is usually worse in the morning or after you have been sitting or lying down for a while. CAUSES This condition may be caused by:  Standing for long periods of time.  Wearing shoes that do not fit.  Doing high-impact activities, including running, aerobics, and ballet.  Being overweight.  Having an abnormal way of walking (gait).  Having tight calf muscles.  Having high arches in your feet.  Starting a new athletic activity. SYMPTOMS The main symptom of this condition is heel pain. Other symptoms include:  Pain that gets worse after activity or exercise.  Pain that is worse in the morning or after resting.  Pain that goes away after you walk for a few minutes. DIAGNOSIS This condition may be diagnosed based on your signs and symptoms. Your health care provider will also do a physical exam to check for:  A tender area on the bottom of your foot.  A high arch in your foot.  Pain when you move your foot.  Difficulty moving your foot. You may also need to have imaging studies to confirm the diagnosis. These can include:  X-rays.  Ultrasound.  MRI. TREATMENT  Treatment for plantar fasciitis depends on the severity of the condition. Your treatment may include:  Rest, ice, and over-the-counter pain medicines to manage your pain.  Exercises to stretch your  calves and your plantar fascia.  A splint that holds your foot in a stretched, upward position while you sleep (night splint).  Physical therapy to relieve symptoms and prevent problems in the future.  Cortisone injections to relieve severe pain.  Extracorporeal shock wave therapy (ESWT) to stimulate damaged plantar fascia with electrical impulses. It is often used as a last resort before surgery.  Surgery, if other treatments have not worked after 12 months. HOME CARE INSTRUCTIONS  Take medicines only as directed by your health care provider.  Avoid activities that cause pain.  Roll the bottom of your foot over a bag of ice or a bottle of cold water. Do this for 20 minutes, 3-4 times a day.  Perform simple stretches as directed by your health care provider.  Try wearing athletic shoes with air-sole or gel-sole cushions or soft shoe inserts.  Wear a night splint while sleeping, if directed by your health care provider.  Keep all follow-up appointments with your health care provider. PREVENTION   Do not perform exercises or activities that cause heel pain.  Consider finding low-impact activities if you continue to have problems.  Lose weight if you need to. The best way to prevent plantar fasciitis is to avoid the activities that aggravate your plantar fascia. SEEK MEDICAL CARE IF:  Your symptoms do not go away after treatment with home care measures.  Your pain gets worse.  Your pain affects your ability to move or do your daily activities.   This information is not intended to replace advice given  to you by your health care provider. Make sure you discuss any questions you have with your health care provider.   Document Released: 08/03/2001 Document Revised: 07/30/2015 Document Reviewed: 09/18/2014 Elsevier Interactive Patient Education [employer].

## 2016-03-04 ENCOUNTER — Encounter: Payer: Self-pay | Admitting: Cardiology

## 2016-03-04 ENCOUNTER — Ambulatory Visit (INDEPENDENT_AMBULATORY_CARE_PROVIDER_SITE_OTHER): Payer: Medicare Other | Admitting: Cardiology

## 2016-03-04 ENCOUNTER — Other Ambulatory Visit: Payer: Self-pay

## 2016-03-04 VITALS — BP 110/70 | HR 54 | Ht 68.0 in | Wt 173.8 lb

## 2016-03-04 DIAGNOSIS — I472 Ventricular tachycardia: Secondary | ICD-10-CM

## 2016-03-04 DIAGNOSIS — I10 Essential (primary) hypertension: Secondary | ICD-10-CM

## 2016-03-04 DIAGNOSIS — I5022 Chronic systolic (congestive) heart failure: Secondary | ICD-10-CM | POA: Diagnosis not present

## 2016-03-04 DIAGNOSIS — E785 Hyperlipidemia, unspecified: Secondary | ICD-10-CM | POA: Diagnosis not present

## 2016-03-04 DIAGNOSIS — N183 Chronic kidney disease, stage 3 unspecified: Secondary | ICD-10-CM

## 2016-03-04 DIAGNOSIS — I2510 Atherosclerotic heart disease of native coronary artery without angina pectoris: Secondary | ICD-10-CM

## 2016-03-04 DIAGNOSIS — I2583 Coronary atherosclerosis due to lipid rich plaque: Secondary | ICD-10-CM

## 2016-03-04 DIAGNOSIS — I4729 Other ventricular tachycardia: Secondary | ICD-10-CM

## 2016-03-04 DIAGNOSIS — I441 Atrioventricular block, second degree: Secondary | ICD-10-CM

## 2016-03-04 MED ORDER — PRAVASTATIN SODIUM 40 MG PO TABS: 40.0000 mg | ORAL_TABLET | Freq: Every day | ORAL | Status: DC

## 2016-03-04 NOTE — Assessment & Plan Note (Signed)
Echo 11/22/11- EF ~f 20% to 25% with Grade 2 diastolic dysfunction and anterior WMA Echo May 2016- EF 45-50% with grade 1 DD  No CHF symptoms

## 2016-03-04 NOTE — Assessment & Plan Note (Addendum)
80% stenosis diag of the LAD, 30% in OM2 branch of LCX in 2009, similar by cath 11/2011 Myoview low risk May 2016, no chest pain

## 2016-03-04 NOTE — Assessment & Plan Note (Signed)
S/P VT RFA July 2013

## 2016-03-04 NOTE — Patient Instructions (Addendum)
Your physician recommends that you schedule a follow-up appointment in: July with Dr Claiborne Billings  Speak to eye Dr Doctor about Tomoptic

## 2016-03-04 NOTE — Assessment & Plan Note (Signed)
Pt noted to have bradycardia, 1st degree AVB and Wenckebach in ED- referred here for follow up. He is asymptomatic

## 2016-03-04 NOTE — Progress Notes (Signed)
Patient ID: [name], male   DOB: [birth year], 75 y.o.   MRN: [id number]   Subjective: This patient presents for a scheduled visit complaining of elongated and thickened toenails which are comfortable walking wearing shoes and request toenail debridement Patient also complains of right inferior heel pain in the last month. He has had evaluation in emergency department for right lower extremity pain in April 2017 with negative DVT and Rx of tramadol dated for pain control 4 7 2017  Objective: Orientated 3 No open skin lesions bilaterally No peripheral edema The toenails are hypertrophic, elongated, discolored, deformed and tender direct palpation 6-10 Palpable tenderness medial plantar fascial insertional area right without any palpable lesions  Assessment: Symptomatic onychomycoses 6-10 Peripheral arterial disease evaluated by Dr. Gwenlyn Found Plantar fasciitis right  Plan: Debridement toenails 6-10 mechanically and electronically without any bleeding Discuss plantar fasciitis right and recommended supportive shoes and stretching  Reappoint 3 month

## 2016-03-04 NOTE — Progress Notes (Signed)
03/04/2016 [name]   February 04, 1941  [id number]  Primary Physician Rodney Peers, MD Primary Cardiologist: Dr [name]  HPI:  75 y/o followed by Dr [name] with problems as listed below. He has had chronic bradycardia and 1st degree AVB but has been asymptomatic. He was in the ED 02/26/16 for pain in his Rt leg. Venous doppler negative for DVT. He was noted to have a slow HR and was told to contact his cardiologist. The pt denies any syncope or near syncope. He has some DOE but this is unchanged and he tells me he can "walk all day without problems".    Current Outpatient Prescriptions  Medication Sig Dispense Refill  . acetaminophen (TYLENOL) 325 MG tablet Take 650 mg by mouth daily. Take every day per patient    . amLODipine (NORVASC) 10 MG tablet TAKE 1 TABLET BY MOUTH DAILY 30 tablet 6  . aspirin 81 MG tablet Take 81 mg by mouth daily.    . furosemide (LASIX) 20 MG tablet Take 1 tablet (20 mg total) by mouth daily. 30 tablet 5  . HYDROcodone-acetaminophen (NORCO/VICODIN) 5-325 MG per tablet Take 1-2 tablets by mouth every 4 (four) hours as needed for moderate pain. 20 tablet 0  . Iron-Vitamins (GERITOL COMPLETE PO) Take 1 tablet by mouth daily.    . isosorbide mononitrate (IMDUR) 30 MG 24 hr tablet TAKE 1 TABLET(30 MG) BY MOUTH DAILY 15 tablet 12  . LUMIGAN 0.01 % SOLN Place 1 drop into both eyes daily.  0  . nitroGLYCERIN (NITROSTAT) 0.4 MG SL tablet Place 1 tablet (0.4 mg total) under the tongue every 5 (five) minutes as needed for chest pain. 25 tablet 3  . oxyCODONE (ROXICODONE) 5 MG immediate release tablet Take 1 tablet (5 mg total) by mouth every 6 (six) hours as needed for breakthrough pain. 15 tablet 0  . potassium chloride SA (K-DUR,KLOR-CON) 20 MEQ tablet Take 20 mEq by mouth daily.    . pravastatin (PRAVACHOL) 40 MG tablet Take 40 mg by mouth daily.  3  . RESTASIS 0.05 % ophthalmic emulsion Place 1 drop into both eyes daily.  11  . spironolactone (ALDACTONE) 25 MG tablet  TAKE 1/2 TABLET BY MOUTH TWICE DAILY 30 tablet 9  . timolol (TIMOPTIC-XR) 0.5 % ophthalmic gel-forming Place 1 drop into both eyes daily.  5  . traMADol (ULTRAM) 50 MG tablet Take 1 tablet (50 mg total) by mouth every 6 (six) hours as needed. 15 tablet 0   No current facility-administered medications for this visit.    No Known Allergies  Social History   Social History  . Marital Status: Legally Separated    Spouse Name: N/A  . Number of Children: 3  . Years of Education: N/A   Occupational History  . Retired    Social History Main Topics  . Smoking status: Former Smoker -- 50 years    Types: Cigarettes    Quit date: 11/23/1999  . Smokeless tobacco: Never Used  . 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 Drug Use: No  . Sexual Activity: Yes   Other Topics Concern  . Not on file   Social History Narrative     Review of Systems: General: negative for chills, fever, night sweats or weight changes.  Cardiovascular: negative for chest pain, dyspnea on exertion, edema, orthopnea, palpitations, paroxysmal nocturnal dyspnea or shortness of breath Dermatological: negative for rash Respiratory:  negative for cough or wheezing Urologic: negative for hematuria Abdominal: negative for nausea, vomiting, diarrhea, bright red blood per rectum, melena, or hematemesis Neurologic: negative for visual changes, syncope, or dizziness All other systems reviewed and are otherwise negative except as noted above.    Blood pressure 110/70, pulse 54, height 5\' 8"  (1.727 m), weight 173 lb 12.8 oz (78.835 kg), SpO2 98 %.  General appearance: alert, cooperative and no distress Lungs: clear to auscultation bilaterally Heart: regular rate and rhythm Extremities: no edema Neurologic: Grossly normal  EKG From ED 02/26/16-NSR, SB HR 53 with 1st degree AVB, PR .35, he had a junctional escape beat, or a PVC on one tracing  ASSESSMENT AND PLAN:     Wenckebach's phenomenon, heart block Pt noted to have bradycardia, 1st degree AVB and Wenckebach in ED- referred here for follow up. He is asymptomatic  Chronic systolic heart failure ([place]) Echo 11/22/11- EF ~f 20% to 25% with Grade 2 diastolic dysfunction and anterior WMA Echo May 2016- EF 45-50% with grade 1 DD  No CHF symptoms  CAD (coronary artery disease)  80% stenosis diag of the LAD, 30% in OM2 branch of LCX in 2009, similar by cath 11/2011 Myoview low risk May 2016, no chest pain   Essential hypertension Controlled  CKD (chronic kidney disease) stage 3, GFR 30-59 ml/min GFR 45-50, K+ 3.8   Hyperlipidemia with target LDL less than 70 On Pravachol  Ventricular tachycardia (paroxysmal) ([place]) S/P VT RFA July 2013   PLAN  Pt has known bradycardia and 1st degree AVB and history of type 1 second degree AVB. He apparently is asymptomatic though I suspect he won't be if his HR gets any slower. I have asked him to contact his opthamologist to see if Timoptic can be changed to another agent (has a beta blocker component). He knows to contact us if he has any symptoms to suggest symptomatic bradycardia. F/U Dr [name] in July as scheduled.   [name] K PA-C 03/04/2016 8:22 AM

## 2016-03-04 NOTE — Assessment & Plan Note (Signed)
On Pravachol 

## 2016-03-04 NOTE — Assessment & Plan Note (Signed)
GFR 45-50, K+ 3.8

## 2016-03-04 NOTE — Assessment & Plan Note (Signed)
Controlled.  

## 2016-03-10 ENCOUNTER — Observation Stay (HOSPITAL_COMMUNITY)
Admission: EM | Admit: 2016-03-10 | Discharge: 2016-03-12 | Disposition: A | Discharge status: Home Health | Payer: Medicare Other | Attending: Internal Medicine | Admitting: Internal Medicine

## 2016-03-10 ENCOUNTER — Emergency Department (HOSPITAL_COMMUNITY): Payer: Medicare Other

## 2016-03-10 ENCOUNTER — Encounter (HOSPITAL_COMMUNITY): Payer: Self-pay | Admitting: Emergency Medicine

## 2016-03-10 DIAGNOSIS — N183 Chronic kidney disease, stage 3 unspecified: Secondary | ICD-10-CM | POA: Diagnosis present

## 2016-03-10 DIAGNOSIS — I130 Hypertensive heart and chronic kidney disease with heart failure and stage 1 through stage 4 chronic kidney disease, or unspecified chronic kidney disease: Secondary | ICD-10-CM | POA: Diagnosis not present

## 2016-03-10 DIAGNOSIS — I5042 Chronic combined systolic (congestive) and diastolic (congestive) heart failure: Secondary | ICD-10-CM | POA: Diagnosis not present

## 2016-03-10 DIAGNOSIS — R197 Diarrhea, unspecified: Secondary | ICD-10-CM

## 2016-03-10 DIAGNOSIS — E872 Acidosis, unspecified: Secondary | ICD-10-CM

## 2016-03-10 DIAGNOSIS — N390 Urinary tract infection, site not specified: Secondary | ICD-10-CM

## 2016-03-10 DIAGNOSIS — Z79899 Other long term (current) drug therapy: Secondary | ICD-10-CM | POA: Insufficient documentation

## 2016-03-10 DIAGNOSIS — I2510 Atherosclerotic heart disease of native coronary artery without angina pectoris: Secondary | ICD-10-CM | POA: Diagnosis present

## 2016-03-10 DIAGNOSIS — R109 Unspecified abdominal pain: Secondary | ICD-10-CM | POA: Diagnosis not present

## 2016-03-10 DIAGNOSIS — R112 Nausea with vomiting, unspecified: Secondary | ICD-10-CM | POA: Diagnosis present

## 2016-03-10 DIAGNOSIS — I441 Atrioventricular block, second degree: Secondary | ICD-10-CM | POA: Insufficient documentation

## 2016-03-10 DIAGNOSIS — M79604 Pain in right leg: Secondary | ICD-10-CM | POA: Diagnosis not present

## 2016-03-10 DIAGNOSIS — H548 Legal blindness, as defined in USA: Secondary | ICD-10-CM | POA: Insufficient documentation

## 2016-03-10 DIAGNOSIS — M79605 Pain in left leg: Secondary | ICD-10-CM | POA: Diagnosis not present

## 2016-03-10 DIAGNOSIS — R52 Pain, unspecified: Secondary | ICD-10-CM | POA: Diagnosis not present

## 2016-03-10 DIAGNOSIS — I252 Old myocardial infarction: Secondary | ICD-10-CM | POA: Insufficient documentation

## 2016-03-10 DIAGNOSIS — Z7982 Long term (current) use of aspirin: Secondary | ICD-10-CM | POA: Insufficient documentation

## 2016-03-10 DIAGNOSIS — I5022 Chronic systolic (congestive) heart failure: Secondary | ICD-10-CM | POA: Diagnosis present

## 2016-03-10 DIAGNOSIS — Z87891 Personal history of nicotine dependence: Secondary | ICD-10-CM | POA: Diagnosis not present

## 2016-03-10 DIAGNOSIS — I440 Atrioventricular block, first degree: Secondary | ICD-10-CM | POA: Diagnosis present

## 2016-03-10 DIAGNOSIS — I5032 Chronic diastolic (congestive) heart failure: Secondary | ICD-10-CM | POA: Diagnosis present

## 2016-03-10 DIAGNOSIS — R1084 Generalized abdominal pain: Secondary | ICD-10-CM | POA: Diagnosis not present

## 2016-03-10 DIAGNOSIS — R2681 Unsteadiness on feet: Secondary | ICD-10-CM | POA: Diagnosis not present

## 2016-03-10 DIAGNOSIS — K5900 Constipation, unspecified: Secondary | ICD-10-CM | POA: Diagnosis not present

## 2016-03-10 DIAGNOSIS — I10 Essential (primary) hypertension: Secondary | ICD-10-CM | POA: Diagnosis present

## 2016-03-10 DIAGNOSIS — N1832 Chronic kidney disease, stage 3b: Secondary | ICD-10-CM | POA: Diagnosis present

## 2016-03-10 DIAGNOSIS — I428 Other cardiomyopathies: Secondary | ICD-10-CM | POA: Insufficient documentation

## 2016-03-10 DIAGNOSIS — M79606 Pain in leg, unspecified: Secondary | ICD-10-CM | POA: Insufficient documentation

## 2016-03-10 LAB — COMPREHENSIVE METABOLIC PANEL
ALBUMIN: 4.2 g/dL (ref 3.5–5.0)
ALK PHOS: 67 U/L (ref 38–126)
ALT: 22 U/L (ref 17–63)
ANION GAP: 15 (ref 5–15)
AST: 30 U/L (ref 15–41)
BILIRUBIN TOTAL: 1.2 mg/dL (ref 0.3–1.2)
BUN: 18 mg/dL (ref 6–20)
CALCIUM: 10 mg/dL (ref 8.9–10.3)
CO2: 22 mmol/L (ref 22–32)
Chloride: 104 mmol/L (ref 101–111)
Creatinine, Ser: 1.67 mg/dL — ABNORMAL HIGH (ref 0.61–1.24)
GFR calc non Af Amer: 38 mL/min — ABNORMAL LOW (ref >60)
GFR, EST AFRICAN AMERICAN: 44 mL/min — AB (ref >60)
GLUCOSE: 169 mg/dL — AB (ref 65–99)
POTASSIUM: 3.7 mmol/L (ref 3.5–5.1)
Sodium: 141 mmol/L (ref 135–145)
TOTAL PROTEIN: 8.4 g/dL — AB (ref 6.5–8.1)

## 2016-03-10 LAB — I-STAT CG4 LACTIC ACID, ED
LACTIC ACID, VENOUS: 3.43 mmol/L — AB (ref 0.5–2.0)
LACTIC ACID, VENOUS: 2.41 mmol/L — AB (ref 0.5–2.0)

## 2016-03-10 LAB — CBC WITH DIFFERENTIAL/PLATELET
BASOS PCT: 0 %
Basophils Absolute: 0 10*3/uL (ref 0.0–0.1)
EOS ABS: 0 10*3/uL (ref 0.0–0.7)
Eosinophils Relative: 0 %
HEMATOCRIT: 44.6 % (ref 39.0–52.0)
HEMOGLOBIN: 15.6 g/dL (ref 13.0–17.0)
LYMPHS ABS: 0.7 10*3/uL (ref 0.7–4.0)
Lymphocytes Relative: 6 %
MCH: 30.4 pg (ref 26.0–34.0)
MCHC: 35 g/dL (ref 30.0–36.0)
MCV: 86.9 fL (ref 78.0–100.0)
MONO ABS: 0.3 10*3/uL (ref 0.1–1.0)
MONOS PCT: 3 %
NEUTROS ABS: 9.4 10*3/uL — AB (ref 1.7–7.7)
Neutrophils Relative %: 91 %
Platelets: 163 10*3/uL (ref 150–400)
RBC: 5.13 MIL/uL (ref 4.22–5.81)
RDW: 12.3 % (ref 11.5–15.5)
WBC: 10.3 10*3/uL (ref 4.0–10.5)

## 2016-03-10 LAB — LIPASE, BLOOD: LIPASE: 16 U/L (ref 11–51)

## 2016-03-10 LAB — URINE MICROSCOPIC-ADD ON

## 2016-03-10 LAB — URINALYSIS, ROUTINE W REFLEX MICROSCOPIC
Bilirubin Urine: NEGATIVE
GLUCOSE, UA: NEGATIVE mg/dL
KETONES UR: NEGATIVE mg/dL
NITRITE: NEGATIVE
PH: 6 (ref 5.0–8.0)
Protein, ur: NEGATIVE mg/dL
SPECIFIC GRAVITY, URINE: 1.018 (ref 1.005–1.030)

## 2016-03-10 LAB — CK: Total CK: 193 U/L (ref 49–397)

## 2016-03-10 LAB — MAGNESIUM: MAGNESIUM: 2.9 mg/dL — AB (ref 1.7–2.4)

## 2016-03-10 LAB — I-STAT TROPONIN, ED: Troponin i, poc: 0.04 ng/mL (ref 0.00–0.08)

## 2016-03-10 MED ORDER — ONDANSETRON HCL 4 MG/2ML IJ SOLN
4.0000 mg | Freq: Once | INTRAMUSCULAR | Status: AC
  Administered 2016-03-10: 4 mg via INTRAVENOUS
  Filled 2016-03-10: qty 2

## 2016-03-10 MED ORDER — HYDROMORPHONE HCL 1 MG/ML IJ SOLN
1.0000 mg | Freq: Once | INTRAMUSCULAR | Status: AC
  Administered 2016-03-11: 1 mg via INTRAVENOUS
  Filled 2016-03-10: qty 1

## 2016-03-10 MED ORDER — ONDANSETRON HCL 4 MG/2ML IJ SOLN
4.0000 mg | Freq: Once | INTRAMUSCULAR | Status: AC
  Administered 2016-03-11: 4 mg via INTRAVENOUS
  Filled 2016-03-10: qty 2

## 2016-03-10 MED ORDER — SODIUM CHLORIDE 0.9 % IV BOLUS (SEPSIS)
500.0000 mL | Freq: Once | INTRAVENOUS | Status: AC
  Administered 2016-03-11: 500 mL via INTRAVENOUS

## 2016-03-10 MED ORDER — IOPAMIDOL (ISOVUE-370) INJECTION 76%
INTRAVENOUS | Status: AC
  Administered 2016-03-10: 100 mL
  Filled 2016-03-10: qty 100

## 2016-03-10 MED ORDER — SODIUM CHLORIDE 0.9 % IV BOLUS (SEPSIS)
1000.0000 mL | Freq: Once | INTRAVENOUS | Status: AC
Start: 2016-03-10 — End: 2016-03-11
  Administered 2016-03-10: 1000 mL via INTRAVENOUS

## 2016-03-10 MED ORDER — SODIUM CHLORIDE 0.9 % IV BOLUS (SEPSIS)
1000.0000 mL | Freq: Once | INTRAVENOUS | Status: AC
  Administered 2016-03-10: 1000 mL via INTRAVENOUS

## 2016-03-10 MED ORDER — PIPERACILLIN-TAZOBACTAM 3.375 G IVPB 30 MIN
3.3750 g | Freq: Once | INTRAVENOUS | Status: AC
  Administered 2016-03-11: 3.375 g via INTRAVENOUS
  Filled 2016-03-10: qty 50

## 2016-03-10 MED ORDER — HYDROMORPHONE HCL 1 MG/ML IJ SOLN
1.0000 mg | Freq: Once | INTRAMUSCULAR | Status: AC
  Administered 2016-03-10: 1 mg via INTRAVENOUS
  Filled 2016-03-10: qty 1

## 2016-03-10 NOTE — ED Provider Notes (Signed)
CSN: [id number]     Arrival date & time 03/10/16  1808 History   First MD Initiated Contact with Patient 03/10/16 1811     Chief Complaint  Patient presents with  . Leg Pain     (Consider location/radiation/quality/duration/timing/severity/associated sxs/prior Treatment) Patient is a 75 y.o. male presenting with abdominal pain.  Abdominal Pain Pain location:  Generalized Pain quality: aching and gnawing   Pain radiates to:  Does not radiate Pain severity:  Severe Onset quality:  Gradual Duration:  1 week Timing:  Constant Progression:  Worsening Chronicity:  New Context: not sick contacts   Relieved by:  Nothing Worsened by:  Palpation and movement Associated symptoms: constipation, diarrhea, dysuria, fatigue, nausea and vomiting   Associated symptoms: no chest pain, no cough, no fever and no shortness of breath   Associated symptoms comment:  Worsening of chronic bilateral leg pain   Past Medical History  Diagnosis Date  . Colon polyp, hyperplastic   . Spondylolisthesis   . Hypertension   . Chronic systolic CHF (congestive heart failure) ([place])   . NICM (nonischemic cardiomyopathy) ([place])     a. Remote hx of dilated NICM with EF ranging 20-45%, including normal EF by echo (55-60%) in 2014.  Marland Kitchen CAD (coronary artery disease) 80% stenosis diag of the LAD, 30% in OM2 branch of LCX in 2009     a. Nonobstructive CAD by cath 11/2011 with the exception of the pre-existing diagonal branch #2 lesion.  Marland Kitchen PVC's (premature ventricular contractions)   . 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 Requiring discontinuation of BB dose.  . Myocardial infarction ([place]) 11/22/11  . Legally blind     "both eyes"  . CKD (chronic kidney disease) stage 3, GFR 30-59 ml/min   . History of stress test 06/01/2012    Normal myocardial perfusion study. compared to the previous study there is no significant change. this is a low risk scan  . Peripheral arterial disease ([place])     a. 06/2014: ABI right  0.99, left 1.2, LE dopplers revealing an occluded right posterior tibial. As symptoms were not felt r/t claudication, no further w/u at the time.  . Ventricular bigeminy   . Ventricular tachycardia (paroxysmal) ([place]) 04/11/2015   Past Surgical History  Procedure Laterality Date  . Cystoscopy    . Rotator cuff repair  2000's    left  . Back surgery    . Cardiac catheterization  11/2011  . Posterior fusion lumbar spine  1979  . Cataract extraction, bilateral  1990's  . Ep study and ablation of vt  7/13    PVC focus mapped to the right coronary cusp of the aorta, limited ablation performed due to proximity of the focus to the right coronary artery  . Eye surgery    . Cystoscopy with urethral dilatation N/A 05/04/2013    Procedure: CYSTOSCOPY WITH URETHRAL DILATATION;  Surgeon: Eustace Moore, MD;  Location: [facility];  Service: Neurosurgery;  Laterality: N/A;  with insertion of foley catheter  . Cardiac catheterization  11/2011    didn't demonstrate high grade obstructive disease to account for his LV dysfunction.  . Left heart catheterization with coronary angiogram N/A 11/25/2011    Procedure: LEFT HEART CATHETERIZATION WITH CORONARY ANGIOGRAM;  Surgeon: Leonie Man, MD;  Location: [facility] CATH LAB;  Service: Cardiovascular;  Laterality: N/A;  . V-tach ablation N/A 06/06/2012    Procedure: V-TACH ABLATION;  Surgeon: Thompson Grayer, MD;  Location: [facility] CATH LAB;  Service: Cardiovascular;  Laterality: N/A;   Family History  Problem Relation Age of Onset  . Asthma Mother   . Other      Unsure if any heart disease in his family.   Social History  Substance Use Topics  . Smoking status: Former Smoker -- 50 years    Types: Cigarettes    Quit date: 11/23/1999  . Smokeless tobacco: Never Used  . 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 Negative for fever.  HENT: Negative for  congestion and rhinorrhea.   Eyes: Negative for visual disturbance.  Respiratory: Negative for cough and shortness of breath.   Cardiovascular: Negative for chest pain and leg swelling.  Gastrointestinal: Positive for nausea, vomiting, abdominal pain, diarrhea and constipation.  Genitourinary: Positive for dysuria and frequency.  Musculoskeletal: Negative for neck pain and neck stiffness.  Skin: Negative for rash.  Allergic/Immunologic: Negative for immunocompromised state.  Neurological: Negative for syncope, weakness and headaches.      Allergies  Review of patient's allergies indicates no known allergies.  Home Medications   Prior to Admission medications   Medication Sig Start Date End Date Taking? Authorizing Provider  acetaminophen (TYLENOL) 325 MG tablet Take 650 mg by mouth daily. Take every day per patient   Yes Historical Provider, MD  amLODipine (NORVASC) 10 MG tablet TAKE 1 TABLET BY MOUTH DAILY 10/20/15  Yes Troy Sine, MD  aspirin 81 MG tablet Take 81 mg by mouth daily.   Yes Historical Provider, MD  furosemide (LASIX) 20 MG tablet Take 1 tablet (20 mg total) by mouth daily. 12/11/15  Yes Troy Sine, MD  HYDROcodone-acetaminophen (NORCO/VICODIN) 5-325 MG per tablet Take 1-2 tablets by mouth every 4 (four) hours as needed for moderate pain. 02/19/15  Yes Donne Hazel, MD  Iron-Vitamins (GERITOL COMPLETE PO) Take 1 tablet by mouth daily.   Yes Historical Provider, MD  isosorbide mononitrate (IMDUR) 30 MG 24 hr tablet TAKE 1 TABLET(30 MG) BY MOUTH DAILY 12/02/15  Yes Troy Sine, MD  LUMIGAN 0.01 % SOLN Place 1 drop into both eyes daily. 12/06/14  Yes Historical Provider, MD  nitroGLYCERIN (NITROSTAT) 0.4 MG SL tablet Place 1 tablet (0.4 mg total) under the tongue every 5 (five) minutes as needed for chest pain. 02/26/15  Yes Troy Sine, MD  oxyCODONE (ROXICODONE) 5 MG immediate release tablet Take 1 tablet (5 mg total) by mouth every 6 (six) hours as needed for  breakthrough pain. 11/27/15  Yes Esaw Grandchild, MD  potassium chloride SA (K-DUR,KLOR-CON) 20 MEQ tablet Take 20 mEq by mouth daily.   Yes Historical Provider, MD  pravastatin (PRAVACHOL) 40 MG tablet Take 1 tablet (40 mg total) by mouth daily. 03/04/16  Yes Troy Sine, MD  RESTASIS 0.05 % ophthalmic emulsion Place 1 drop into both eyes daily. 12/30/14  Yes Historical Provider, MD  spironolactone (ALDACTONE) 25 MG tablet TAKE 1/2 TABLET BY MOUTH TWICE DAILY 01/26/16  Yes Troy Sine, MD  timolol (TIMOPTIC-XR) 0.5 % ophthalmic gel-forming Place 1 drop into both eyes daily. 12/10/14  Yes Historical Provider, MD  traMADol (ULTRAM) 50 MG tablet Take 1 tablet (50 mg total) by mouth every 6 (six) hours as needed. 02/26/16  Yes Lajean Saver, MD   BP 126/59 mmHg  Pulse 69  Temp(Src) 98.7 F (37.1 C) (Oral)  Resp 21  SpO2 96%  Physical Exam  Constitutional: He is oriented to person, place, and time. He appears well-developed and well-nourished. He appears distressed.  HENT:  Head: Normocephalic.  Mouth/Throat: No oropharyngeal exudate.  Eyes: Conjunctivae are normal. Pupils are equal, round, and reactive to light.  Neck: Normal range of motion.  Cardiovascular: Normal rate, regular rhythm, normal heart sounds and intact distal pulses.  Exam reveals no friction rub.   No murmur heard. Pulmonary/Chest: Effort normal and breath sounds normal. No respiratory distress. He has no wheezes. He has no rales.  Abdominal: Soft. He exhibits distension. There is tenderness. There is no rebound and no guarding.  Genitourinary:  Rectum with diffuse tenderness to palpation, worse over prostate although tenderness limits isolation to the prostate. Well-formed but not impacted stool in the rectal vault. There is a small amount of overflow incontinence noted.  Musculoskeletal:  Lower extremities with mild tenderness to palpation over the hamstrings and calves with no associated rash. Distal pulses 2+ and symmetric  bilaterally.  Neurological: He is alert and oriented to person, place, and time.  Skin: Skin is warm. No rash noted.  Nursing note and vitals reviewed.   ED Course  Procedures (including critical care time) Labs Review Labs Reviewed  CBC WITH DIFFERENTIAL/PLATELET - Abnormal; Notable for the following:    Neutro Abs 9.4 (*)    All other components within normal limits  COMPREHENSIVE METABOLIC PANEL - Abnormal; Notable for the following:    Glucose, Bld 169 (*)    Creatinine, Ser 1.67 (*)    Total Protein 8.4 (*)    GFR calc non Af Amer 38 (*)    GFR calc Af Amer 44 (*)    All other components within normal limits  MAGNESIUM - Abnormal; Notable for the following:    Magnesium 2.9 (*)    All other components within normal limits  URINALYSIS, ROUTINE W REFLEX MICROSCOPIC (NOT AT [facility]) - Abnormal; Notable for the following:    Hgb urine dipstick SMALL (*)    Leukocytes, UA MODERATE (*)    All other components within normal limits  URINE MICROSCOPIC-ADD ON - Abnormal; Notable for the following:    Squamous Epithelial / LPF 0-5 (*)    Bacteria, UA FEW (*)    All other components within normal limits  I-STAT CG4 LACTIC ACID, ED - Abnormal; Notable for the following:    Lactic Acid, Venous 3.43 (*)    All other components within normal limits  I-STAT CG4 LACTIC ACID, ED - Abnormal; Notable for the following:    Lactic Acid, Venous 2.41 (*)    All other components within normal limits  CULTURE, BLOOD (ROUTINE X 2)  CULTURE, BLOOD (ROUTINE X 2)  URINE CULTURE  LIPASE, BLOOD  CK  URINE RAPID DRUG SCREEN, HOSP PERFORMED  MAGNESIUM  PHOSPHORUS  TSH  COMPREHENSIVE METABOLIC PANEL  CBC  I-STAT TROPOININ, ED    Imaging Review Ct Angio Abd/pel W/ And/or W/o  03/10/2016  CLINICAL DATA:  Abdominal pain and lactic acidosis. Abdominal pain radiates to both legs. Nausea and vomiting. EXAM: CTA ABDOMEN AND PELVIS wITHOUT AND WITH CONTRAST TECHNIQUE: Multidetector CT imaging of the  abdomen and pelvis was performed using the standard protocol during bolus administration of intravenous contrast. Multiplanar reconstructed images and MIPs were obtained and reviewed to evaluate the vascular anatomy. CONTRAST:  100 mL Isovue 370 IV COMPARISON:  CT 11/27/2015 FINDINGS: Lower chest: Dependent atelectasis in the right greater than left lung bases. The heart is enlarged. No pleural effusion.  Liver: Tiny hypodensity in the inferior right lobe, too small to accurately characterize. This is likely unchanged from prior. No suspicious hepatic lesion. Hepatobiliary: Postcholecystectomy with stable intra and extrahepatic biliary ductal dilatation. Pancreas: No ductal dilatation or inflammation. Spleen: Normal. Adrenal glands: No nodule. Kidneys: Symmetric renal enhancement. No hydronephrosis. Previous right renal calculus is obscured by excreted contrast. Unchanged simple cyst in the lower left kidney. Additional tiny cortical hypodensities, too small to characterize. Stomach/Bowel: Stomach distended with ingested contents. There are ingested contents within distal esophagus which is patulous. No dilated or inflamed small bowel loops. There is liquid and solid stool throughout the colon. Increased stool in the rectum. No colonic wall thickening. Aorta and vascular structures: Normal caliber abdominal aorta with moderate atherosclerosis. No evidence of dissection. No aneurysm. The celiac, superior mesenteric, and inferior mesenteric arteries are widely patent. No abrupt occlusion. Single bilateral renal arteries are patent. Atherosclerosis of bilateral common and external iliac arteries, no evidence of flow-limiting stenosis or aneurysm. The portal venous system is patent. Lymphatic: No retroperitoneal adenopathy. No mesenteric or pelvic adenopathy. Reproductive: Coarse prostatic calcification with mild prostate hypertrophy causing mass effect on the bladder base. Bladder: Physiologically distended, no wall  thickening. Other: No free air, free fluid, or intra-abdominal fluid collection. Musculoskeletal: Posterior lumbar fusion from T11 through L4. The hardware is intact. Mild compression deformity of L1 is unchanged. Review of the MIP images confirms the above findings. IMPRESSION: 1. No explanation for lactic acidosis. No evidence of bowel ischemia. 2. Stomach distended with ingested contents with ingested contents in the distal esophagus. This may reflect reflux or gastroenteritis. No gastric wall thickening. 3. Diffusely fluid-filled colon without colonic no colonic inflammation. 4. Additional chronic findings are stable. Atherosclerosis throughout the abdominal aorta and its branches without acute vascular abnormality. Electronically Signed   By: Jeb Levering M.D.   On: 03/10/2016 23:37   I have personally reviewed and evaluated these images and lab results as part of my medical decision-making.   EKG Interpretation   Date/Time:  Wednesday March 10 2016 19:19:29 EDT Ventricular Rate:  93 PR Interval:  217 QRS Duration: 121 QT Interval:  367 QTC Calculation: 456 R Axis:   -54 Text Interpretation:  Sinus rhythm Sinus pause Prolonged PR interval  Consider right atrial enlargement Left bundle branch block Since prior  ECG, rate has increased No significant change since last tracing Confirmed  by [facility] MD, ERIN ([id number]) on 03/10/2016 7:28:50 PM      MDM  75 yo M with extensive PMHx including HTN, HLD, PAD/PVD, CAD with h/o ischemic CM, who presents with diffuse abdominal pain, nausea, vomiting, and diarrhea in setting of worsening constipation, now with acute on chronic bilateral LE pain. Regarding LE pain, this is chronic and likely acutely worsened in setting of GI illness. Pulses intact with no signs of ischemia or compartment syndrome. Regarding abdominal symptoms, DDx includes: constipation with overflow incontinence, viral GI syndrome, diverticlitis, colitis, appendicitis, less likely  cholecystitis. Must also consider mesenteric ischemia given PVD. Will check labs, CTA A/P and re-assess.  Labs/imaging as above. CBC with WBC 10.3, normal Hgb. CMP with baseline CKD - IVF given. LA elevated at 3.43, improved but not resolved afer 2L NS. UA with possible UTI. Given c/f UTI versus intra-abdominal infection, will start Zosyn. Cultures sent. Pt ow HDS and abdomen remains soft.  CTA A/P shows no ischemia or acute abnormality. On rectal exam, pt has large stool burden, moderate prostate TTP. He has been given Zosyn. Will admit for  IVF, trending of lactate, treatement of possible UTI with prostatitis. Pt in agreement.  Clinical Impression: 1. UTI (lower urinary tract infection)   2. Lactic acidosis   3. Abdominal pain   4. Leg pain   5. Constipation, unspecified constipation type     Disposition: Admit  Condition: Stable  Pt seen in conjunction with Dr. Erin Sons, MD 03/11/16 [id number]  Gareth Morgan, MD 03/12/16 1229

## 2016-03-10 NOTE — ED Notes (Signed)
Pt complaining of bilateral leg pain. Pt also complaining of palpatations but denies CP. EKG per EMS showed SR with occasional PVC's. BP 152/94, HR 93, resp 20, 98% on room air.

## 2016-03-10 NOTE — ED Notes (Signed)
Patient transported to CT 

## 2016-03-11 ENCOUNTER — Observation Stay (HOSPITAL_COMMUNITY): Payer: Medicare Other

## 2016-03-11 ENCOUNTER — Encounter (HOSPITAL_COMMUNITY): Payer: Self-pay | Admitting: Internal Medicine

## 2016-03-11 DIAGNOSIS — I10 Essential (primary) hypertension: Secondary | ICD-10-CM | POA: Diagnosis not present

## 2016-03-11 DIAGNOSIS — N390 Urinary tract infection, site not specified: Secondary | ICD-10-CM | POA: Insufficient documentation

## 2016-03-11 DIAGNOSIS — K5900 Constipation, unspecified: Secondary | ICD-10-CM | POA: Diagnosis not present

## 2016-03-11 DIAGNOSIS — I5022 Chronic systolic (congestive) heart failure: Secondary | ICD-10-CM

## 2016-03-11 DIAGNOSIS — I440 Atrioventricular block, first degree: Secondary | ICD-10-CM

## 2016-03-11 DIAGNOSIS — E872 Acidosis, unspecified: Secondary | ICD-10-CM | POA: Insufficient documentation

## 2016-03-11 DIAGNOSIS — R112 Nausea with vomiting, unspecified: Secondary | ICD-10-CM | POA: Diagnosis present

## 2016-03-11 DIAGNOSIS — N183 Chronic kidney disease, stage 3 (moderate): Secondary | ICD-10-CM | POA: Diagnosis not present

## 2016-03-11 DIAGNOSIS — I2510 Atherosclerotic heart disease of native coronary artery without angina pectoris: Secondary | ICD-10-CM | POA: Diagnosis not present

## 2016-03-11 DIAGNOSIS — R1084 Generalized abdominal pain: Secondary | ICD-10-CM

## 2016-03-11 DIAGNOSIS — M79606 Pain in leg, unspecified: Secondary | ICD-10-CM | POA: Insufficient documentation

## 2016-03-11 DIAGNOSIS — R109 Unspecified abdominal pain: Secondary | ICD-10-CM | POA: Insufficient documentation

## 2016-03-11 DIAGNOSIS — K529 Noninfective gastroenteritis and colitis, unspecified: Secondary | ICD-10-CM | POA: Insufficient documentation

## 2016-03-11 DIAGNOSIS — M79604 Pain in right leg: Secondary | ICD-10-CM

## 2016-03-11 DIAGNOSIS — R197 Diarrhea, unspecified: Secondary | ICD-10-CM

## 2016-03-11 LAB — CBC
HEMATOCRIT: 42.6 % (ref 39.0–52.0)
HEMOGLOBIN: 14.3 g/dL (ref 13.0–17.0)
MCH: 29.5 pg (ref 26.0–34.0)
MCHC: 33.6 g/dL (ref 30.0–36.0)
MCV: 87.8 fL (ref 78.0–100.0)
Platelets: 163 10*3/uL (ref 150–400)
RBC: 4.85 MIL/uL (ref 4.22–5.81)
RDW: 12.7 % (ref 11.5–15.5)
WBC: 13.6 10*3/uL — AB (ref 4.0–10.5)

## 2016-03-11 LAB — COMPREHENSIVE METABOLIC PANEL
ALT: 44 U/L (ref 17–63)
ANION GAP: 13 (ref 5–15)
AST: 52 U/L — ABNORMAL HIGH (ref 15–41)
Albumin: 3.6 g/dL (ref 3.5–5.0)
Alkaline Phosphatase: 60 U/L (ref 38–126)
BILIRUBIN TOTAL: 1.8 mg/dL — AB (ref 0.3–1.2)
BUN: 18 mg/dL (ref 6–20)
CHLORIDE: 107 mmol/L (ref 101–111)
CO2: 22 mmol/L (ref 22–32)
Calcium: 8.9 mg/dL (ref 8.9–10.3)
Creatinine, Ser: 1.53 mg/dL — ABNORMAL HIGH (ref 0.61–1.24)
GFR calc Af Amer: 49 mL/min — ABNORMAL LOW (ref >60)
GFR, EST NON AFRICAN AMERICAN: 42 mL/min — AB (ref >60)
Glucose, Bld: 126 mg/dL — ABNORMAL HIGH (ref 65–99)
POTASSIUM: 4.4 mmol/L (ref 3.5–5.1)
Sodium: 142 mmol/L (ref 135–145)
Total Protein: 7.6 g/dL (ref 6.5–8.1)

## 2016-03-11 LAB — RAPID URINE DRUG SCREEN, HOSP PERFORMED
AMPHETAMINES: NOT DETECTED
Barbiturates: NOT DETECTED
Benzodiazepines: NOT DETECTED
Cocaine: NOT DETECTED
OPIATES: NOT DETECTED
TETRAHYDROCANNABINOL: NOT DETECTED

## 2016-03-11 LAB — PHOSPHORUS: PHOSPHORUS: 2.6 mg/dL (ref 2.5–4.6)

## 2016-03-11 LAB — MAGNESIUM: MAGNESIUM: 2.7 mg/dL — AB (ref 1.7–2.4)

## 2016-03-11 LAB — TSH: TSH: 0.722 u[IU]/mL (ref 0.350–4.500)

## 2016-03-11 MED ORDER — ACETAMINOPHEN 325 MG PO TABS
650.0000 mg | ORAL_TABLET | Freq: Four times a day (QID) | ORAL | Status: DC | PRN
  Administered 2016-03-11: 650 mg via ORAL
  Filled 2016-03-11: qty 2

## 2016-03-11 MED ORDER — ONDANSETRON HCL 4 MG/2ML IJ SOLN: 4.0000 mg | Freq: Four times a day (QID) | INTRAMUSCULAR | Status: DC | PRN

## 2016-03-11 MED ORDER — AMLODIPINE BESYLATE 10 MG PO TABS
10.0000 mg | ORAL_TABLET | Freq: Every day | ORAL | Status: DC
  Administered 2016-03-11 – 2016-03-12 (×2): 10 mg via ORAL
  Filled 2016-03-11 (×2): qty 1

## 2016-03-11 MED ORDER — MILK AND MOLASSES ENEMA
1.0000 | Freq: Once | RECTAL | Status: DC
  Filled 2016-03-11: qty 250

## 2016-03-11 MED ORDER — SODIUM CHLORIDE 0.9 % IV SOLN: 250.0000 mL | INTRAVENOUS | Status: DC | PRN

## 2016-03-11 MED ORDER — ASPIRIN 81 MG PO CHEW
81.0000 mg | CHEWABLE_TABLET | Freq: Every day | ORAL | Status: DC
  Administered 2016-03-11 – 2016-03-12 (×2): 81 mg via ORAL
  Filled 2016-03-11 (×2): qty 1

## 2016-03-11 MED ORDER — SPIRONOLACTONE 25 MG PO TABS
12.5000 mg | ORAL_TABLET | Freq: Two times a day (BID) | ORAL | Status: DC
  Administered 2016-03-11 – 2016-03-12 (×4): 12.5 mg via ORAL
  Filled 2016-03-11 (×4): qty 1

## 2016-03-11 MED ORDER — PRAVASTATIN SODIUM 40 MG PO TABS
40.0000 mg | ORAL_TABLET | Freq: Every day | ORAL | Status: DC
  Administered 2016-03-11: 40 mg via ORAL
  Filled 2016-03-11: qty 1

## 2016-03-11 MED ORDER — TRAMADOL HCL 50 MG PO TABS: 50.0000 mg | ORAL_TABLET | Freq: Four times a day (QID) | ORAL | Status: DC | PRN

## 2016-03-11 MED ORDER — ISOSORBIDE MONONITRATE ER 30 MG PO TB24
30.0000 mg | ORAL_TABLET | Freq: Every day | ORAL | Status: DC
  Administered 2016-03-11 – 2016-03-12 (×2): 30 mg via ORAL
  Filled 2016-03-11 (×2): qty 1

## 2016-03-11 MED ORDER — TIMOLOL MALEATE 0.5 % OP SOLG
1.0000 [drp] | Freq: Every day | OPHTHALMIC | Status: DC
  Administered 2016-03-11 – 2016-03-12 (×2): 1 [drp] via OPHTHALMIC
  Filled 2016-03-11: qty 5

## 2016-03-11 MED ORDER — LATANOPROST 0.005 % OP SOLN
1.0000 [drp] | Freq: Every day | OPHTHALMIC | Status: DC
  Administered 2016-03-11: 1 [drp] via OPHTHALMIC
  Filled 2016-03-11: qty 2.5

## 2016-03-11 MED ORDER — HYDROCODONE-ACETAMINOPHEN 5-325 MG PO TABS
1.0000 | ORAL_TABLET | ORAL | Status: DC | PRN
  Administered 2016-03-11: 2 via ORAL
  Filled 2016-03-11: qty 2

## 2016-03-11 MED ORDER — ENOXAPARIN SODIUM 30 MG/0.3ML ~~LOC~~ SOLN
30.0000 mg | SUBCUTANEOUS | Status: DC
Start: 2016-03-11 — End: 2016-03-11
  Administered 2016-03-11: 30 mg via SUBCUTANEOUS
  Filled 2016-03-11 (×2): qty 0.3

## 2016-03-11 MED ORDER — SODIUM CHLORIDE 0.9% FLUSH: 3.0000 mL | INTRAVENOUS | Status: DC | PRN

## 2016-03-11 MED ORDER — ONDANSETRON HCL 4 MG PO TABS: 4.0000 mg | ORAL_TABLET | Freq: Four times a day (QID) | ORAL | Status: DC | PRN

## 2016-03-11 MED ORDER — ENOXAPARIN SODIUM 40 MG/0.4ML ~~LOC~~ SOLN: 40.0000 mg | SUBCUTANEOUS | Status: DC

## 2016-03-11 MED ORDER — SODIUM CHLORIDE 0.9% FLUSH
3.0000 mL | Freq: Two times a day (BID) | INTRAVENOUS | Status: DC
  Administered 2016-03-11 – 2016-03-12 (×2): 3 mL via INTRAVENOUS

## 2016-03-11 MED ORDER — CYCLOSPORINE 0.05 % OP EMUL
1.0000 [drp] | Freq: Every day | OPHTHALMIC | Status: DC
  Administered 2016-03-11 – 2016-03-12 (×2): 1 [drp] via OPHTHALMIC
  Filled 2016-03-11 (×2): qty 1

## 2016-03-11 MED ORDER — ACETAMINOPHEN 650 MG RE SUPP: 650.0000 mg | Freq: Four times a day (QID) | RECTAL | Status: DC | PRN

## 2016-03-11 MED ORDER — SODIUM CHLORIDE 0.9 % IV SOLN
INTRAVENOUS | Status: DC
  Administered 2016-03-11: 06:00:00 via INTRAVENOUS

## 2016-03-11 NOTE — H&P (Signed)
[name] [id number] DOB: 1941/09/29 DOA: 03/10/2016   Referring MD : Ellender Hose PCP: Elyn Peers, MD   Outpatient Specialists: Shelva Majestic and Quay Burow Patient coming from: Home  Chief Complaint: Nausea vomiting diarrhea bilateral leg pain  HPI: [name] is a 75 y.o. male with medical history significant of coronary artery disease, hypertension, combined systolic diastolic heart failure and nonischemic cardiomyopathy,  history of chronic kidney disease as well as   peripheral arterial disease    Presented with bilateral leg pain palpitations abdominal pain nausea vomiting diarrhea. Reports pain all over for the past 24 hour no fever but stays cold. Denis burning with urination. Reports recatal pain associated with diarrhea. He had 2 episodes of vomiting, He had severe constipation last week so he drank epsom salt 3 table spoons in 8 ounces using it as a laxative. He initially vomited but then started to have small squirting bowel movements and severe abdominal pain and rectal pain radiating to both legs. Have had a lot of abdominal cramping.  EMS was called on arrival blood pressure 152/94 heart rate 93 respirations 20 satting 98% room air 4+ descriptors: Location, Quality, Severity, Duration, Timing, Context, modifying factors, associated signs/symptoms and/or status of 3+ chronic problems. Regarding pertinent Chronic problems: Patient has known chronic bradycardia and first-degree AV block followed by cardiology usually symptomatic. He has chronic lower extremity pain last time was evaluated in emergency department on April 6 at that time venous Doppler of her legs was negative for DVT he denies any claudication symptoms Patient has known history of combined chronic systolic/diastolic heart failure last echogram in May 2016 showing EF 45-50 percent and grade 1 diastolic dysfunction he has no symptoms well compensated Known history of coronary artery disease followed  by cardiology last cardiac catheterization 2013 showing stenosis of LAD myoview low risk in May 2016 Patient has history of paroxysmal ventricular tachycardia last episode was in July 2013 status post ablation by Dr. Rayann Heman Has known chronic kidney disease with GFR 45-50 percent stable  Patient has known history of peripheral arterial disease ABIs in 2015 was 0.99 on right and 1.2 low extremity Doppler showed occluded right posterior tibial artery in the left at that time no further evaluation was offered as he symptoms felt to be not related to claudication   IN ER: UA showed 6-30 white blood cells as well as RBCs but only few bacteria lactic acid was initially elevated at 2.41 troponin was normal at 0.04 creatinine at baseline 1.67 LFTs unremarkable and lipase 16 total CK 193, white blood cell count 10.3     Hospitalist was called for admission for intractable nausea vomiting abdominal pain  Review of Systems:    Pertinent positives include: Abdominal pain bilateral lower extremity pain  Constitutional:  No weight loss, night sweats, Fevers, chills, fatigue, weight loss  HEENT:  No headaches, Difficulty swallowing,Tooth/dental problems,Sore throat,  No sneezing, itching, ear ache, nasal congestion, post nasal drip,  Cardio-vascular:  No chest pain, Orthopnea, PND, anasarca, dizziness, palpitations.no Bilateral lower extremity swelling  GI:  No heartburn, indigestion, abdominal pain, nausea, vomiting, diarrhea, change in bowel habits, loss of appetite, melena, blood in stool, hematemesis Resp:  no shortness of breath at rest. No dyspnea on exertion, No excess mucus, no productive cough, No non-productive cough, No coughing up of blood.No change in color of mucus.No wheezing. Skin:  no rash or lesions. No jaundice GU:  no dysuria, change in color of urine, no urgency or frequency. No  straining to urinate.  No flank pain.  Musculoskeletal:  No joint pain or no joint swelling. No  decreased range of motion. No back pain.  Psych:  No change in mood or affect. No depression or anxiety. No memory loss.  Neuro: no localizing neurological complaints, no tingling, no weakness, no double vision, no gait abnormality, no slurred speech, no confusion  As per HPI otherwise 10 point review of systems negative.   Past Medical History: Past Medical History  Diagnosis Date  . Colon polyp, hyperplastic   . Spondylolisthesis   . Hypertension   . Chronic systolic CHF (congestive heart failure) ([place])   . NICM (nonischemic cardiomyopathy) ([place])     a. Remote hx of dilated NICM with EF ranging 20-45%, including normal EF by echo (55-60%) in 2014.  Marland Kitchen CAD (coronary artery disease) 80% stenosis diag of the LAD, 30% in OM2 branch of LCX in 2009     a. Nonobstructive CAD by cath 11/2011 with the exception of the pre-existing diagonal branch #2 lesion.  Marland Kitchen PVC's (premature ventricular contractions)   . 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 Requiring discontinuation of BB dose.  . Myocardial infarction ([place]) 11/22/11  . Legally blind     "both eyes"  . CKD (chronic kidney disease) stage 3, GFR 30-59 ml/min   . History of stress test 06/01/2012    Normal myocardial perfusion study. compared to the previous study there is no significant change. this is a low risk scan  . Peripheral arterial disease ([place])     a. 06/2014: ABI right 0.99, left 1.2, LE dopplers revealing an occluded right posterior tibial. As symptoms were not felt r/t claudication, no further w/u at the time.  . Ventricular bigeminy   . Ventricular tachycardia (paroxysmal) ([place]) 04/11/2015   Past Surgical History  Procedure Laterality Date  . Cystoscopy    . Rotator cuff repair  2000's    left  . Back surgery    . Cardiac catheterization  11/2011  . Posterior fusion lumbar spine  1979  . Cataract extraction, bilateral  1990's  . Ep study and ablation of vt  7/13    PVC focus mapped to the right coronary cusp of the  aorta, limited ablation performed due to proximity of the focus to the right coronary artery  . Eye surgery    . Cystoscopy with urethral dilatation N/A 05/04/2013    Procedure: CYSTOSCOPY WITH URETHRAL DILATATION;  Surgeon: Eustace Moore, MD;  Location: [facility]UR[facility] ORS;  Service: Neurosurgery;  Laterality: N/A;  with insertion of foley catheter  . Cardiac catheterization  11/2011    didn't demonstrate high grade obstructive disease to account for his LV dysfunction.  . Left heart catheterization with coronary angiogram N/A 11/25/2011    Procedure: LEFT HEART CATHETERIZATION WITH CORONARY ANGIOGRAM;  Surgeon: Leonie Man, MD;  Location: [facility] CATH LAB;  Service: Cardiovascular;  Laterality: N/A;  . V-tach ablation N/A 06/06/2012    Procedure: V-TACH ABLATION;  Surgeon: Thompson Grayer, MD;  Location[facility];  Service: Cardiovascular;  Laterality: N/A;     Social History:  Ambulatory   independently  Lives at home   With girlfriend     reports that he quit smoking about 16 years ago. His smoking use included Cigarettes. He quit after 50 years of use. He has never used smokeless tobacco. He reports that he does not drink alcohol or use illicit drugs.  Allergies:  No Known Allergies  Family History: Family History  Problem Relation Age of Onset  . Asthma Mother   . Other      Unsure if any heart disease in his family.    Medications: Prior to Admission medications   Medication Sig Start Date End Date Taking? Authorizing Provider  acetaminophen (TYLENOL) 325 MG tablet Take 650 mg by mouth daily. Take every day per patient   Yes Historical Provider, MD  amLODipine (NORVASC) 10 MG tablet TAKE 1 TABLET BY MOUTH DAILY 10/20/15  Yes Troy Sine, MD  aspirin 81 MG tablet Take 81 mg by mouth daily.   Yes Historical Provider, MD  furosemide (LASIX) 20 MG tablet Take 1 tablet (20 mg total) by mouth daily. 12/11/15  Yes Troy Sine, MD  HYDROcodone-acetaminophen (NORCO/VICODIN) 5-325 MG  per tablet Take 1-2 tablets by mouth every 4 (four) hours as needed for moderate pain. 02/19/15  Yes Donne Hazel, MD  Iron-Vitamins (GERITOL COMPLETE PO) Take 1 tablet by mouth daily.   Yes Historical Provider, MD  isosorbide mononitrate (IMDUR) 30 MG 24 hr tablet TAKE 1 TABLET(30 MG) BY MOUTH DAILY 12/02/15  Yes Troy Sine, MD  LUMIGAN 0.01 % SOLN Place 1 drop into both eyes daily. 12/06/14  Yes Historical Provider, MD  nitroGLYCERIN (NITROSTAT) 0.4 MG SL tablet Place 1 tablet (0.4 mg total) under the tongue every 5 (five) minutes as needed for chest pain. 02/26/15  Yes Troy Sine, MD  oxyCODONE (ROXICODONE) 5 MG immediate release tablet Take 1 tablet (5 mg total) by mouth every 6 (six) hours as needed for breakthrough pain. 11/27/15  Yes Esaw Grandchild, MD  potassium chloride SA (K-DUR,KLOR-CON) 20 MEQ tablet Take 20 mEq by mouth daily.   Yes Historical Provider, MD  pravastatin (PRAVACHOL) 40 MG tablet Take 1 tablet (40 mg total) by mouth daily. 03/04/16  Yes Troy Sine, MD  RESTASIS 0.05 % ophthalmic emulsion Place 1 drop into both eyes daily. 12/30/14  Yes Historical Provider, MD  spironolactone (ALDACTONE) 25 MG tablet TAKE 1/2 TABLET BY MOUTH TWICE DAILY 01/26/16  Yes Troy Sine, MD  timolol (TIMOPTIC-XR) 0.5 % ophthalmic gel-forming Place 1 drop into both eyes daily. 12/10/14  Yes Historical Provider, MD  traMADol (ULTRAM) 50 MG tablet Take 1 tablet (50 mg total) by mouth every 6 (six) hours as needed. 02/26/16  Yes Lajean Saver, MD    Physical Exam: Patient Vitals for the past 24 hrs:  BP Temp Temp src Pulse Resp SpO2  03/10/16 2200 128/59 mmHg - - 97 20 100 %  03/10/16 2100 110/59 mmHg - - 94 20 100 %  03/10/16 2000 130/70 mmHg - - 88 22 98 %  03/10/16 1945 126/71 mmHg - - 91 24 100 %  03/10/16 1920 (!) 136/53 mmHg 98.7 F (37.1 C) Oral 80 22 100 %  03/10/16 1900 137/60 mmHg - - 80 - 99 %  03/10/16 1818 130/73 mmHg 98 F (36.7 C) Oral - 16 98 %    1. General:  in pain 2.  Psychological: Alert and     Oriented 3. Head/ENT:    Dry Mucous Membranes                          Head Non traumatic, neck supple                            Poor Dentition 4. SKIN:  decreased Skin turgor,  Skin clean Dry and intact no rash 5. Heart: Regular rate and rhythm no Murmur, Rub or gallop 6. Lungs:  Clear to auscultation bilaterally, no wheezes or crackles   7. Abdomen: Soft, non-tender, Non distended 8. Lower extremities: no clubbing, cyanosis, or edema, palpable pulses bilateraly 9. Neurologically Grossly intact, moving all 4 extremities equally 10. MSK: Normal range of motion Rectal exam per ER MD large amount of stool, generalized rectal tenderness in area of prostate as well but not specifically so.    body mass index is unknown because there is no weight on file.  Labs on Admission:   Labs on Admission: I have personally reviewed following labs and imaging studies  CBC:  Recent Labs Lab 03/10/16 2000  WBC 10.3  NEUTROABS 9.4*  HGB 15.6  HCT 44.6  MCV 86.9  PLT [id number]   Basic Metabolic Panel:  Recent Labs Lab 03/10/16 2000  NA 141  K 3.7  CL 104  CO2 22  GLUCOSE 169*  BUN 18  CREATININE 1.67*  CALCIUM 10.0  MG 2.9*   GFR: Estimated Creatinine Clearance: 35.8 mL/min (by C-G formula based on Cr of 1.67). Liver Function Tests:  Recent Labs Lab 03/10/16 2000  AST 30  ALT 22  ALKPHOS 67  BILITOT 1.2  PROT 8.4*  ALBUMIN 4.2    Recent Labs Lab 03/10/16 2000  LIPASE 16   No results for input(s): AMMONIA in the last 168 hours. Coagulation Profile: No results for input(s): INR, PROTIME in the last 168 hours. Cardiac Enzymes:  Recent Labs Lab 03/10/16 2000  CKTOTAL 193   BNP (last 3 results) No results for input(s): PROBNP in the last 8760 hours. HbA1C: No results for input(s): HGBA1C in the last 72 hours. CBG: No results for input(s): GLUCAP in the last 168 hours. Lipid Profile: No results for input(s): CHOL, HDL, LDLCALC, TRIG,  CHOLHDL, LDLDIRECT in the last 72 hours. Thyroid Function Tests: No results for input(s): TSH, T4TOTAL, FREET4, T3FREE, THYROIDAB in the last 72 hours. Anemia Panel: No results for input(s): VITAMINB12, FOLATE, FERRITIN, TIBC, IRON, RETICCTPCT in the last 72 hours. Urine analysis:    Component Value Date/Time   COLORURINE YELLOW 03/10/2016 2154   APPEARANCEUR CLEAR 03/10/2016 2154   LABSPEC 1.018 03/10/2016 2154   PHURINE 6.0 03/10/2016 2154   GLUCOSEU NEGATIVE 03/10/2016 2154   HGBUR SMALL* 03/10/2016 2154   BILIRUBINUR NEGATIVE 03/10/2016 2154   KETONESUR NEGATIVE 03/10/2016 2154   PROTEINUR NEGATIVE 03/10/2016 2154   UROBILINOGEN 0.2 11/22/2011 0247   NITRITE NEGATIVE 03/10/2016 2154   LEUKOCYTESUR MODERATE* 03/10/2016 2154   Sepsis Labs: @LABRCNTIP (procalcitonin:4,lacticidven:4) )No results found for this or any previous visit (from the past 240 hour(s)).       UA Increased WBC and RBC few bacteria only  Lab Results  Component Value Date   HGBA1C 5.9* 05/26/2012    Estimated Creatinine Clearance: 35.8 mL/min (by C-G formula based on Cr of 1.67).  BNP (last 3 results) No results for input(s): PROBNP in the last 8760 hours.   ECG REPORT  Independently reviewed Rate: 93  Rhythm: LBBB w PVC ST&T Change: no ischemic changes QTC 456   There were no vitals filed for this visit.   Cultures:    Component Value Date/Time   SDES URINE, CLEAN CATCH 02/18/2015 2002   SPECREQUEST NONE 02/18/2015 2002   CULT  02/18/2015 1843    NO GROWTH 5 DAYS Performed at [facility] 02/19/2015 FINAL  02/18/2015 2002     Radiological Exams on Admission: Ct Angio Abd/pel W/ And/or W/o  03/10/2016  CLINICAL DATA:  Abdominal pain and lactic acidosis. Abdominal pain radiates to both legs. Nausea and vomiting. EXAM: CTA ABDOMEN AND PELVIS wITHOUT AND WITH CONTRAST TECHNIQUE: Multidetector CT imaging of the abdomen and pelvis was performed using the standard  protocol during bolus administration of intravenous contrast. Multiplanar reconstructed images and MIPs were obtained and reviewed to evaluate the vascular anatomy. CONTRAST:  100 mL Isovue 370 IV COMPARISON:  CT 11/27/2015 FINDINGS: Lower chest: Dependent atelectasis in the right greater than left lung bases. The heart is enlarged. No pleural effusion. Liver: Tiny hypodensity in the inferior right lobe, too small to accurately characterize. This is likely unchanged from prior. No suspicious hepatic lesion. Hepatobiliary: Postcholecystectomy with stable intra and extrahepatic biliary ductal dilatation. Pancreas: No ductal dilatation or inflammation. Spleen: Normal. Adrenal glands: No nodule. Kidneys: Symmetric renal enhancement. No hydronephrosis. Previous right renal calculus is obscured by excreted contrast. Unchanged simple cyst in the lower left kidney. Additional tiny cortical hypodensities, too small to characterize. Stomach/Bowel: Stomach distended with ingested contents. There are ingested contents within distal esophagus which is patulous. No dilated or inflamed small bowel loops. There is liquid and solid stool throughout the colon. Increased stool in the rectum. No colonic wall thickening. Aorta and vascular structures: Normal caliber abdominal aorta with moderate atherosclerosis. No evidence of dissection. No aneurysm. The celiac, superior mesenteric, and inferior mesenteric arteries are widely patent. No abrupt occlusion. Single bilateral renal arteries are patent. Atherosclerosis of bilateral common and external iliac arteries, no evidence of flow-limiting stenosis or aneurysm. The portal venous system is patent. Lymphatic: No retroperitoneal adenopathy. No mesenteric or pelvic adenopathy. Reproductive: Coarse prostatic calcification with mild prostate hypertrophy causing mass effect on the bladder base. Bladder: Physiologically distended, no wall thickening. Other: No free air, free fluid, or  intra-abdominal fluid collection. Musculoskeletal: Posterior lumbar fusion from T11 through L4. The hardware is intact. Mild compression deformity of L1 is unchanged. Review of the MIP images confirms the above findings. IMPRESSION: 1. No explanation for lactic acidosis. No evidence of bowel ischemia. 2. Stomach distended with ingested contents with ingested contents in the distal esophagus. This may reflect reflux or gastroenteritis. No gastric wall thickening. 3. Diffusely fluid-filled colon without colonic no colonic inflammation. 4. Additional chronic findings are stable. Atherosclerosis throughout the abdominal aorta and its branches without acute vascular abnormality. Electronically Signed   By: Jeb Levering M.D.   On: 03/10/2016 23:37    Chart has been reviewed    Assessment/Plan  75 yo M with hx of CAD, chronic combined systolic, diastolic CHF and 1st degree AV block here with obstipation and likely colonic dilation after taking Epsom salt laxative.  Present on Admission:  . Obstipation - order mild and molasses enema and then will need to start bowel regimen given long hx of opioid use.  Marland Kitchen CAD (coronary artery disease)  - stable cont. Home medications statin and aspirin . Chronic systolic heart failure (HCC) - currently slightly fluid down will rehydrate and resume lasix when stable . CKD (chronic kidney disease) stage 3, GFR 30-59 ml/min - chronic at baseline . Essential hypertension - continue home medication . Nausea vomiting and diarrhea - after taking epsom salt in the setting of obstipation. No fever to suggest infectious etiology if improves after obstipation resolution no further work up needed.  Prostate calcifications - order PSA, Prostatitis is a possibility given somewhat tender prostate on  exam but also could be referred pain if pain resolves after BM prostatitis would be less likely  leg pain bilaterally could be referred from rectal pressure but given abnormal ABI in  the past will repeat to eval for progression. Critical ischemia likely given palpable pulses  . First degree AV block -  stable chronic    Other plan as per orders.  DVT prophylaxis: Lovenox    Code Status:   FULL CODE  as per patient   Family Communication:   Family not  at  Bedside     Disposition Plan:   To home once workup is complete and patient is stable   Consults called: none Admission status:    obs   Level of care     tele             I have spent a total of 57 min on this admission    Grayce Budden 03/11/2016, 1:32 AM    [facility]  Pager [phone]   after 2 AM please page floor coverage PA If 7AM-7PM, please contact the day team taking care of the patient  [email or web address]  Password [email or web address]

## 2016-03-11 NOTE — ED Notes (Signed)
RN attempted to call report to floor; RN to call back  

## 2016-03-11 NOTE — Progress Notes (Signed)
NURSING PROGRESS NOTE  Rodney Thor WhitleyMRN: [id number] Admission Data: 03/11/2016 at 3:45AM Attending Provider: Toy Baker, MD PCP: Elyn Peers, MD Code status: Full  Allergies: No Known Allergies  Past Medical History:  Past Medical History  Diagnosis Date  . Colon polyp, hyperplastic   . Spondylolisthesis   . Hypertension   . Chronic systolic CHF (congestive heart failure) ([place])   . NICM (nonischemic cardiomyopathy) ([place])     a. Remote hx of dilated NICM with EF ranging 20-45%, including normal EF by echo (55-60%) in 2014.  Marland Kitchen CAD (coronary artery disease) 80% stenosis diag of the LAD, 30% in OM2 branch of LCX in 2009     a. Nonobstructive CAD by cath 11/2011 with the exception of the pre-existing diagonal branch #2 lesion.  Marland Kitchen PVC's (premature ventricular contractions)   . 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 Requiring discontinuation of BB dose.  . Myocardial infarction ([place]) 11/22/11  . Legally blind     "both eyes"  . CKD (chronic kidney disease) stage 3, GFR 30-59 ml/min   . History of stress test 06/01/2012    Normal myocardial perfusion study. compared to the previous study there is no significant change. this is a low risk scan  . Peripheral arterial disease ([place])     a. 06/2014: ABI right 0.99, left 1.2, LE dopplers revealing an occluded right posterior tibial. As symptoms were not felt r/t claudication, no further w/u at the time.  . Ventricular bigeminy   . Ventricular tachycardia (paroxysmal) ([place]) 04/11/2015    Past Surgical History:  Past Surgical History  Procedure Laterality Date  . Cystoscopy    . Rotator cuff repair  2000's    left  . Back surgery    . Cardiac catheterization  11/2011  . Posterior fusion lumbar spine  1979  . Cataract extraction, bilateral  1990's  . Ep study and ablation of vt  7/13    PVC focus mapped to the right coronary cusp of the aorta, limited ablation performed due to proximity of the focus to the right  coronary artery  . Eye surgery    . Cystoscopy with urethral dilatation N/A 05/04/2013    Procedure: CYSTOSCOPY WITH URETHRAL DILATATION;  Surgeon: Eustace Moore, MD;  Location: [place] NEURO ORS;  Service: Neurosurgery;  Laterality: N/A;  with insertion of foley catheter  . Cardiac catheterization  11/2011    didn't demonstrate high grade obstructive disease to account for his LV dysfunction.  . Left heart catheterization with coronary angiogram N/A 11/25/2011    Procedure: LEFT HEART CATHETERIZATION WITH CORONARY ANGIOGRAM;  Surgeon: Leonie Man, MD;  Location: [facility] CATH LAB;  Service: Cardiovascular;  Laterality: N/A;  . V-tach ablation N/A 06/06/2012    Procedure: V-TACH ABLATION;  Surgeon: Thompson Grayer, MD;  Location: [facility] CATH LAB;  Service: Cardiovascular;  Laterality: N/A;    [name] is a 75 y.o.  male patient, arrived to floor in room [phone] via stretcher, transferred from ED. Patient alert and oriented X 4. No acute distress noted. Complains of 10/10 pain in abdomen. PRN pain medication administered on arrival.  Vital signs: Oral temperature 98.6 F (37 C), Blood pressure 136/55, Pulse 76, RR 17, SpO2 96 % on room air. Height 5'8", weight 170 lbs (77.3 kg).   IV access: Left forearm; condition patent and no redness.  Skin: intact, no pressure ulcer noted in sacral area.   Patient's ID armband verified with patient and in place.  Information packet given to patient. Fall risk assessed, SR up X2, patient able to verbalize understanding of risks associated with falls and to call nurse or staff to assist before getting out of bed. Patient oriented to room and equipment. Call bell within reach.             2

## 2016-03-11 NOTE — Progress Notes (Addendum)
Progress Note    [name]  [id number] DOB: 03-31-1941  DOA: 03/10/2016 PCP: Rodney Peers, MD   Outpatient Specialists:   None.   Brief Narrative:   [name] is an 75 y.o. male with a PMH of CAD, hypertension, combined systolic/diastolic CHF, nonischemic cardiomyopathy, peripheral artery disease, end-stage 3 CKD was admitted on 03/10/16 with a chief complaint of nausea, vomiting, diarrhea, and bilateral leg pain. Patient was noted to have obstipation in the setting of chronic opiate use, and a milk and molasses enema was ordered on admission.  Assessment/Plan:   Principal Problem:   Obstipation with nausea, vomiting and diarrhea Reports good results from milk and molasses enema with improvement in abdominal pain and leg pain.  Active Problems:   CAD (coronary artery disease)  Continue aspirin and Imdur.    Chronic systolic heart failure (HCC) Currently compensated.    CKD (chronic kidney disease) stage 3, GFR 30-59 ml/min Creatinine consistent with usual baseline values.    First degree AV block Stable.    Essential hypertension Continue Norvasc.   Family Communication/Anticipated D/C date and plan/Code Status   DVT prophylaxis: Lovenox ordered. Code Status: Full Code.  Family Communication: No family at bedside, but pastor present. Disposition Plan: Probably home 03/12/16.   Medical Consultants:    None.   Procedures:    Anti-Infectives:   Anti-infectives    Start     Dose/Rate Route Frequency Ordered Stop   03/10/16 2315  piperacillin-tazobactam (ZOSYN) IVPB 3.375 g     3.375 g 100 mL/hr over 30 Minutes Intravenous  Once 03/10/16 2308 03/11/16 0103      Subjective:   [name] reports that he feels better after his bowels moved this morning. Also reports improvement in leg pain. No dyspnea.Denies dysuria.  Objective:    Filed Vitals:   03/11/16 0359 03/11/16 0851 03/11/16 0908 03/11/16 1406  BP: 136/55 151/70  143/72 130/60  Pulse: 76 61 65 59  Temp: 98.6 F (37 C) 98.2 F (36.8 C)  98.3 F (36.8 C)  TempSrc:  Oral  Oral  Resp: 17 16  16   Height: 5\' 8"  (1.727 m)     Weight: 77.3 kg (170 lb 6.7 oz)     SpO2: 96% 99%  97%    Intake/Output Summary (Last 24 hours) at 03/11/16 1605 Last data filed at 03/11/16 0645  Gross per 24 hour  Intake 2611.33 ml  Output    100 ml  Net 2511.33 ml   Filed Weights   03/11/16 0359  Weight: 77.3 kg (170 lb 6.7 oz)    Exam: General exam: Appears calm and comfortable.  Respiratory system: Clear to auscultation. Respiratory effort normal. Cardiovascular system: S1 & S2 heard, RRR. No JVD, murmurs, rubs, gallops or clicks. No pedal edema. Gastrointestinal system: Abdomen is nondistended, soft and nontender. No organomegaly or masses felt. Normal bowel sounds heard. Central nervous system: Alert and oriented. No focal neurological deficits. Extremities: Symmetric 5 x 5 power. No clubbing, edema, or cyanosis. Skin: No rashes, lesions or ulcers Psychiatry: Judgement and insight appear normal. Mood & affect appropriate.   Data Reviewed:   I have personally reviewed following labs and imaging studies:  Labs: Basic Metabolic Panel:  Recent Labs Lab 03/10/16 2000 03/11/16 0517  NA 141 142  K 3.7 4.4  CL 104 107  CO2 22 22  GLUCOSE 169* 126*  BUN 18 18  CREATININE 1.67* 1.53*  CALCIUM 10.0 8.9  MG 2.9* 2.7*  PHOS  --  2.6   GFR Estimated Creatinine Clearance: 39.1 mL/min (by C-G formula based on Cr of 1.53). Liver Function Tests:  Recent Labs Lab 03/10/16 2000 03/11/16 0517  AST 30 52*  ALT 22 44  ALKPHOS 67 60  BILITOT 1.2 1.8*  PROT 8.4* 7.6  ALBUMIN 4.2 3.6    Recent Labs Lab 03/10/16 2000  LIPASE 16   CBC:  Recent Labs Lab 03/10/16 2000 03/11/16 0517  WBC 10.3 13.6*  NEUTROABS 9.4*  --   HGB 15.6 14.3  HCT 44.6 42.6  MCV 86.9 87.8  PLT 163 163   Cardiac Enzymes:  Recent Labs Lab 03/10/16 2000  CKTOTAL 193     Thyroid function studies:  Recent Labs  03/11/16 0517  TSH 0.722   Sepsis Labs:  Recent Labs Lab 03/10/16 2000 03/10/16 2023 03/10/16 2204 03/11/16 0517  WBC 10.3  --   --  13.6*  LATICACIDVEN  --  3.43* 2.41*  --    Urine analysis:    Component Value Date/Time   COLORURINE YELLOW 03/10/2016 2154   APPEARANCEUR CLEAR 03/10/2016 2154   LABSPEC 1.018 03/10/2016 2154   PHURINE 6.0 03/10/2016 2154   [place] NEGATIVE 03/10/2016 2154   HGBUR SMALL* 03/10/2016 2154   [place] NEGATIVE 03/10/2016 2154   [place] NEGATIVE 03/10/2016 2154   PROTEINUR NEGATIVE 03/10/2016 2154   UROBILINOGEN 0.2 11/22/2011 0247   NITRITE NEGATIVE 03/10/2016 2154   LEUKOCYTESUR MODERATE* 03/10/2016 2154   Microbiology No results found for this or any previous visit (from the past 240 hour(s)).  Radiology: Ct Angio Abd/pel W/ And/or W/o  03/10/2016  CLINICAL DATA:  Abdominal pain and lactic acidosis. Abdominal pain radiates to both legs. Nausea and vomiting. EXAM: CTA ABDOMEN AND PELVIS wITHOUT AND WITH CONTRAST TECHNIQUE: Multidetector CT imaging of the abdomen and pelvis was performed using the standard protocol during bolus administration of intravenous contrast. Multiplanar reconstructed images and MIPs were obtained and reviewed to evaluate the vascular anatomy. CONTRAST:  100 mL Isovue 370 IV COMPARISON:  CT 11/27/2015 FINDINGS: Lower chest: Dependent atelectasis in the right greater than left lung bases. The heart is enlarged. No pleural effusion. Liver: Tiny hypodensity in the inferior right lobe, too small to accurately characterize. This is likely unchanged from prior. No suspicious hepatic lesion. Hepatobiliary: Postcholecystectomy with stable intra and extrahepatic biliary ductal dilatation. Pancreas: No ductal dilatation or inflammation. Spleen: Normal. Adrenal glands: No nodule. Kidneys: Symmetric renal enhancement. No hydronephrosis. Previous right renal calculus is obscured by  excreted contrast. Unchanged simple cyst in the lower left kidney. Additional tiny cortical hypodensities, too small to characterize. Stomach/Bowel: Stomach distended with ingested contents. There are ingested contents within distal esophagus which is patulous. No dilated or inflamed small bowel loops. There is liquid and solid stool throughout the colon. Increased stool in the rectum. No colonic wall thickening. Aorta and vascular structures: Normal caliber abdominal aorta with moderate atherosclerosis. No evidence of dissection. No aneurysm. The celiac, superior mesenteric, and inferior mesenteric arteries are widely patent. No abrupt occlusion. Single bilateral renal arteries are patent. Atherosclerosis of bilateral common and external iliac arteries, no evidence of flow-limiting stenosis or aneurysm. The portal venous system is patent. Lymphatic: No retroperitoneal adenopathy. No mesenteric or pelvic adenopathy. Reproductive: Coarse prostatic calcification with mild prostate hypertrophy causing mass effect on the bladder base. Bladder: Physiologically distended, no wall thickening. Other: No free air, free fluid, or intra-abdominal fluid collection. Musculoskeletal: Posterior lumbar fusion from T11 through L4. The hardware is intact. Mild  compression deformity of L1 is unchanged. Review of the MIP images confirms the above findings. IMPRESSION: 1. No explanation for lactic acidosis. No evidence of bowel ischemia. 2. Stomach distended with ingested contents with ingested contents in the distal esophagus. This may reflect reflux or gastroenteritis. No gastric wall thickening. 3. Diffusely fluid-filled colon without colonic no colonic inflammation. 4. Additional chronic findings are stable. Atherosclerosis throughout the abdominal aorta and its branches without acute vascular abnormality. Electronically Signed   By: Jeb Levering M.D.   On: 03/10/2016 23:37    Medications:   . amLODipine  10 mg Oral Daily    . aspirin  81 mg Oral Daily  . cycloSPORINE  1 drop Both Eyes Daily  . [START ON 03/12/2016] enoxaparin (LOVENOX) injection  40 mg Subcutaneous Q24H  . isosorbide mononitrate  30 mg Oral Daily  . latanoprost  1 drop Both Eyes QHS  . pravastatin  40 mg Oral q1800  . sodium chloride flush  3 mL Intravenous Q12H  . spironolactone  12.5 mg Oral BID  . timolol  1 drop Both Eyes Daily   Continuous Infusions:    Time spent: 25 minutes.   LOS: 0 days   Hser Belanger  [facility] Pager [phone]. If unable to reach me by pager, please call my cell phone at [phone].  *Please refer to [email or web address], password [email or web address] to get updated schedule on who will round on this patient, as hospitalists switch teams weekly. If 7PM-7AM, please contact night-coverage at [email or web address], password [email or web address] for any overnight needs.  03/11/2016, 4:05 PM

## 2016-03-11 NOTE — ED Notes (Signed)
Admitting at bedside 

## 2016-03-12 ENCOUNTER — Observation Stay (HOSPITAL_BASED_OUTPATIENT_CLINIC_OR_DEPARTMENT_OTHER): Payer: Medicare Other

## 2016-03-12 DIAGNOSIS — I5022 Chronic systolic (congestive) heart failure: Secondary | ICD-10-CM | POA: Diagnosis not present

## 2016-03-12 DIAGNOSIS — M79606 Pain in leg, unspecified: Secondary | ICD-10-CM

## 2016-03-12 DIAGNOSIS — K5901 Slow transit constipation: Secondary | ICD-10-CM

## 2016-03-12 DIAGNOSIS — K5900 Constipation, unspecified: Secondary | ICD-10-CM | POA: Diagnosis not present

## 2016-03-12 DIAGNOSIS — R1030 Lower abdominal pain, unspecified: Secondary | ICD-10-CM | POA: Diagnosis not present

## 2016-03-12 DIAGNOSIS — R2681 Unsteadiness on feet: Secondary | ICD-10-CM | POA: Diagnosis present

## 2016-03-12 DIAGNOSIS — I2510 Atherosclerotic heart disease of native coronary artery without angina pectoris: Secondary | ICD-10-CM | POA: Diagnosis not present

## 2016-03-12 LAB — BASIC METABOLIC PANEL
Anion gap: 9 (ref 5–15)
BUN: 15 mg/dL (ref 6–20)
CHLORIDE: 109 mmol/L (ref 101–111)
CO2: 22 mmol/L (ref 22–32)
CREATININE: 1.37 mg/dL — AB (ref 0.61–1.24)
Calcium: 8.8 mg/dL — ABNORMAL LOW (ref 8.9–10.3)
GFR calc Af Amer: 56 mL/min — ABNORMAL LOW (ref >60)
GFR calc non Af Amer: 48 mL/min — ABNORMAL LOW (ref >60)
GLUCOSE: 115 mg/dL — AB (ref 65–99)
Potassium: 4.2 mmol/L (ref 3.5–5.1)
SODIUM: 140 mmol/L (ref 135–145)

## 2016-03-12 LAB — LACTIC ACID, PLASMA: Lactic Acid, Venous: 0.9 mmol/L (ref 0.5–2.0)

## 2016-03-12 LAB — URINE CULTURE: SPECIAL REQUESTS: NORMAL

## 2016-03-12 MED ORDER — POLYETHYLENE GLYCOL 3350 17 G PO PACK: 17.0000 g | PACK | Freq: Every day | ORAL | Status: DC

## 2016-03-12 NOTE — Discharge Instructions (Signed)
Constipation, Adult Constipation is when a person:  Poops (has a bowel movement) less than 3 times a week.  Has a hard time pooping.  Has poop that is dry, hard, or bigger than normal. HOME CARE   Eat foods with a lot of fiber in them. This includes fruits, vegetables, beans, and whole grains such as brown rice.  Avoid fatty foods and foods with a lot of sugar. This includes french fries, hamburgers, cookies, candy, and soda.  If you are not getting enough fiber from food, take products with added fiber in them (supplements).  Drink enough fluid to keep your pee (urine) clear or pale yellow.  Exercise on a regular basis, or as told by your doctor.  Go to the restroom when you feel like you need to poop. Do not hold it.  Only take medicine as told by your doctor. Do not take medicines that help you poop (laxatives) without talking to your doctor first. GET HELP RIGHT AWAY IF:   You have bright red blood in your poop (stool).  Your constipation lasts more than 4 days or gets worse.  You have belly (abdominal) or butt (rectal) pain.  You have thin poop (as thin as a pencil).  You lose weight, and it cannot be explained. MAKE SURE YOU:   Understand these instructions.  Will watch your condition.  Will get help right away if you are not doing well or get worse.   This information is not intended to replace advice given to you by your health care provider. Make sure you discuss any questions you have with your health care provider.   Document Released: 04/26/2008 Document Revised: 11/29/2014 Document Reviewed: 08/20/2013 Elsevier Interactive Patient Education 2016 [employer]  

## 2016-03-12 NOTE — Progress Notes (Signed)
NURSING PROGRESS NOTE  THEON DIOP [id number] Discharge Data: 03/12/2016 3:43 PM Attending Provider: Venetia Maxon Rama, MD [id number] J, MD     [name] to be D/C'd Home per MD order.  Discussed with the patient the After Visit Summary and all questions fully answered. All IV's discontinued with no bleeding noted. All belongings returned to patient for patient to take home. Pt taken downstairs via wheelchair accompanied by a staff member.  Last Vital Signs:  Blood pressure 137/62, pulse 68, temperature 98.8 F (37.1 C), temperature source Oral, resp. rate 16, height 5\' 8"  (1.727 m), weight 77.3 kg (170 lb 6.7 oz), SpO2 100 %.  Discharge Medication List   Medication List    TAKE these medications        acetaminophen 325 MG tablet  Commonly known as:  TYLENOL  Take 650 mg by mouth daily. Take every day per patient     amLODipine 10 MG tablet  Commonly known as:  NORVASC  TAKE 1 TABLET BY MOUTH DAILY     aspirin 81 MG tablet  Take 81 mg by mouth daily.     furosemide 20 MG tablet  Commonly known as:  LASIX  Take 1 tablet (20 mg total) by mouth daily.     GERITOL COMPLETE PO  Take 1 tablet by mouth daily.     HYDROcodone-acetaminophen 5-325 MG tablet  Commonly known as:  NORCO/VICODIN  Take 1-2 tablets by mouth every 4 (four) hours as needed for moderate pain.     isosorbide mononitrate 30 MG 24 hr tablet  Commonly known as:  IMDUR  TAKE 1 TABLET(30 MG) BY MOUTH DAILY     LUMIGAN 0.01 % Soln  Generic drug:  bimatoprost  Place 1 drop into both eyes daily.     nitroGLYCERIN 0.4 MG SL tablet  Commonly known as:  NITROSTAT  Place 1 tablet (0.4 mg total) under the tongue every 5 (five) minutes as needed for chest pain.     oxyCODONE 5 MG immediate release tablet  Commonly known as:  ROXICODONE  Take 1 tablet (5 mg total) by mouth every 6 (six) hours as needed for breakthrough pain.     polyethylene glycol packet  Commonly known as:  MIRALAX / GLYCOLAX   Take 17 g by mouth daily.     potassium chloride SA 20 MEQ tablet  Commonly known as:  K-DUR,KLOR-CON  Take 20 mEq by mouth daily.     pravastatin 40 MG tablet  Commonly known as:  PRAVACHOL  Take 1 tablet (40 mg total) by mouth daily.     RESTASIS 0.05 % ophthalmic emulsion  Generic drug:  cycloSPORINE  Place 1 drop into both eyes daily.     spironolactone 25 MG tablet  Commonly known as:  ALDACTONE  TAKE 1/2 TABLET BY MOUTH TWICE DAILY     timolol 0.5 % ophthalmic gel-forming  Commonly known as:  TIMOPTIC-XR  Place 1 drop into both eyes daily.     traMADol 50 MG tablet  Commonly known as:  ULTRAM  Take 1 tablet (50 mg total) by mouth every 6 (six) hours as needed.

## 2016-03-12 NOTE — Discharge Summary (Signed)
Physician Discharge Summary  [name] [id number] DOB: [birth year] DOA: 03/10/2016  PCP: Elyn Peers, MD  Admit date: 03/10/2016 Discharge date: 03/12/2016   Recommendations for Outpatient Follow-Up:   1. The patient can follow-up with his PCP as needed. PCP: Please follow up final blood culture results. 2. Home health PT requested.    Discharge Diagnosis:   Principal Problem:    Obstipation Active Problems:    CAD (coronary artery disease)     Chronic systolic heart failure (HCC)    CKD (chronic kidney disease) stage 3, GFR 30-59 ml/min    First degree AV block    Essential hypertension    Nausea vomiting and diarrhea    Abdominal pain    Constipation    Lactic acidosis    Leg pain    Unsteady gait   Discharge disposition:  Home.    Discharge Condition: Improved.  Diet recommendation: Low sodium, heart healthy.    History of Present Illness:   [name] is an 75 y.o. male with a PMH of CAD, hypertension, combined systolic/diastolic CHF, nonischemic cardiomyopathy, peripheral artery disease, end-stage 3 CKD was admitted on 03/10/16 with a chief complaint of nausea, vomiting, diarrhea, and bilateral leg pain. Patient was noted to have obstipation in the setting of chronic opiate use, and a milk and molasses enema was ordered on admission.  Hospital Course by Problem:   Principal Problem:  Obstipation with nausea, vomiting and diarrhea Reports good results from milk and molasses enema with improvement in abdominal pain and leg pain.  Active Problems:   Unsteady gait We'll set up home health PT.   CAD (coronary artery disease)  Continue aspirin and Imdur.   Chronic systolic heart failure (HCC) Currently compensated.   CKD (chronic kidney disease) stage 3, GFR 30-59 ml/min Creatinine consistent with usual baseline values.   First degree AV block Stable.   Essential hypertension Continue Norvasc.    Medical  Consultants:    None.   Discharge Exam:   Filed Vitals:   03/12/16 0428 03/12/16 0934  BP: 152/66 137/62  Pulse: 66 68  Temp: 98.8 F (37.1 C)   Resp: 16 16   Filed Vitals:   03/11/16 2051 03/12/16 0116 03/12/16 0428 03/12/16 0934  BP: 160/62 141/69 152/66 137/62  Pulse: 70 68 66 68  Temp: 100 F (37.8 C) 98.6 F (37 C) 98.8 F (37.1 C)   TempSrc: Oral Oral    Resp: 18 18 16 16   Height:      Weight:      SpO2: 99% 98% 100% 100%    Gen:  NAD Cardiovascular:  RRR, No M/R/G Respiratory: Lungs CTAB Gastrointestinal: Abdomen soft, NT/ND with normal active bowel sounds. Extremities: No C/E/C   The results of significant diagnostics from this hospitalization (including imaging, microbiology, ancillary and laboratory) are listed below for reference.     Procedures and Diagnostic Studies:   Ct Angio Abd/pel W/ And/or W/o  03/10/2016  CLINICAL DATA:  Abdominal pain and lactic acidosis. Abdominal pain radiates to both legs. Nausea and vomiting. EXAM: CTA ABDOMEN AND PELVIS wITHOUT AND WITH CONTRAST TECHNIQUE: Multidetector CT imaging of the abdomen and pelvis was performed using the standard protocol during bolus administration of intravenous contrast. Multiplanar reconstructed images and MIPs were obtained and reviewed to evaluate the vascular anatomy. CONTRAST:  100 mL Isovue 370 IV COMPARISON:  CT 11/27/2015 FINDINGS: Lower chest: Dependent atelectasis in the right greater than left lung bases. The heart is enlarged. No pleural  effusion. Liver: Tiny hypodensity in the inferior right lobe, too small to accurately characterize. This is likely unchanged from prior. No suspicious hepatic lesion. Hepatobiliary: Postcholecystectomy with stable intra and extrahepatic biliary ductal dilatation. Pancreas: No ductal dilatation or inflammation. Spleen: Normal. Adrenal glands: No nodule. Kidneys: Symmetric renal enhancement. No hydronephrosis. Previous right renal calculus is obscured by  excreted contrast. Unchanged simple cyst in the lower left kidney. Additional tiny cortical hypodensities, too small to characterize. Stomach/Bowel: Stomach distended with ingested contents. There are ingested contents within distal esophagus which is patulous. No dilated or inflamed small bowel loops. There is liquid and solid stool throughout the colon. Increased stool in the rectum. No colonic wall thickening. Aorta and vascular structures: Normal caliber abdominal aorta with moderate atherosclerosis. No evidence of dissection. No aneurysm. The celiac, superior mesenteric, and inferior mesenteric arteries are widely patent. No abrupt occlusion. Single bilateral renal arteries are patent. Atherosclerosis of bilateral common and external iliac arteries, no evidence of flow-limiting stenosis or aneurysm. The portal venous system is patent. Lymphatic: No retroperitoneal adenopathy. No mesenteric or pelvic adenopathy. Reproductive: Coarse prostatic calcification with mild prostate hypertrophy causing mass effect on the bladder base. Bladder: Physiologically distended, no wall thickening. Other: No free air, free fluid, or intra-abdominal fluid collection. Musculoskeletal: Posterior lumbar fusion from T11 through L4. The hardware is intact. Mild compression deformity of L1 is unchanged. Review of the MIP images confirms the above findings. IMPRESSION: 1. No explanation for lactic acidosis. No evidence of bowel ischemia. 2. Stomach distended with ingested contents with ingested contents in the distal esophagus. This may reflect reflux or gastroenteritis. No gastric wall thickening. 3. Diffusely fluid-filled colon without colonic no colonic inflammation. 4. Additional chronic findings are stable. Atherosclerosis throughout the abdominal aorta and its branches without acute vascular abnormality. Electronically Signed   By: Jeb Levering M.D.   On: 03/10/2016 23:37     Labs:   Basic Metabolic Panel:  Recent  Labs Lab 03/10/16 2000 03/11/16 0517 03/12/16 0513  NA 141 142 140  K 3.7 4.4 4.2  CL 104 107 109  CO2 22 22 22   GLUCOSE 169* 126* 115*  BUN 18 18 15   CREATININE 1.67* 1.53* 1.37*  CALCIUM 10.0 8.9 8.8*  MG 2.9* 2.7*  --   PHOS  --  2.6  --    GFR Estimated Creatinine Clearance: 43.7 mL/min (by C-G formula based on Cr of 1.37). Liver Function Tests:  Recent Labs Lab 03/10/16 2000 03/11/16 0517  AST 30 52*  ALT 22 44  ALKPHOS 67 60  BILITOT 1.2 1.8*  PROT 8.4* 7.6  ALBUMIN 4.2 3.6    Recent Labs Lab 03/10/16 2000  LIPASE 16   CBC:  Recent Labs Lab 03/10/16 2000 03/11/16 0517  WBC 10.3 13.6*  NEUTROABS 9.4*  --   HGB 15.6 14.3  HCT 44.6 42.6  MCV 86.9 87.8  PLT 163 163   Cardiac Enzymes:  Recent Labs Lab 03/10/16 2000  CKTOTAL 193   Thyroid function studies  Recent Labs  03/11/16 0517  TSH 0.722   Microbiology Recent Results (from the past 240 hour(s))  Urine culture     Status: None   Collection Time: 03/10/16  9:54 PM  Result Value Ref Range Status   Specimen Description URINE, CLEAN CATCH  Final   Special Requests Normal  Final   Culture MULTIPLE SPECIES PRESENT, SUGGEST RECOLLECTION  Final   Report Status 03/12/2016 FINAL  Final  Blood culture (routine x 2)  Status: None (Preliminary result)   Collection Time: 03/11/16 12:10 AM  Result Value Ref Range Status   Specimen Description BLOOD LEFT ANTECUBITAL  Final   Special Requests IN PEDIATRIC BOTTLE 2CC  Final   Culture NO GROWTH 1 DAY  Final   Report Status PENDING  Incomplete  Blood culture (routine x 2)     Status: None (Preliminary result)   Collection Time: 03/11/16 12:15 AM  Result Value Ref Range Status   Specimen Description BLOOD RIGHT ARM  Final   Special Requests BOTTLES DRAWN AEROBIC AND ANAEROBIC 10ML  Final   Culture NO GROWTH 1 DAY  Final   Report Status PENDING  Incomplete     Discharge Instructions:   Discharge Instructions    Call MD for:  extreme  fatigue    Complete by:  As directed      Call MD for:  persistant nausea and vomiting    Complete by:  As directed      Call MD for:  severe uncontrolled pain    Complete by:  As directed      Diet - low sodium heart healthy    Complete by:  As directed      Face-to-face encounter (required for Medicare/Medicaid patients)    Complete by:  As directed   I RAMA,CHRISTINA certify that this patient is under my care and that I, or a nurse practitioner or physician's assistant working with me, had a face-to-face encounter that meets the physician face-to-face encounter requirements with this patient on 03/12/2016. The encounter with the patient was in whole, or in part for the following medical condition(s) which is the primary reason for home health care (List medical condition): Came in with obstipation, ambulated patient myself, gait unsteady, needed CGA.  Does not use walking aides at home.  Needs PT for safety.  The encounter with the patient was in whole, or in part, for the following medical condition, which is the primary reason for home health care:  Obstipation, unsteady gait  I certify that, based on my findings, the following services are medically necessary home health services:  Physical therapy  Reason for Medically [place]:  Therapy- Therapeutic Exercises to Increase Strength and Endurance  My clinical findings support the need for the above services:  Unsafe ambulation due to balance issues  Further, I certify that my clinical findings support that this patient is homebound due to:  Unsafe ambulation due to balance issues     Home Health    Complete by:  As directed   To provide the following care/treatments:  PT     Increase activity slowly    Complete by:  As directed             Medication List    TAKE these medications        acetaminophen 325 MG tablet  Commonly known as:  TYLENOL  Take 650 mg by mouth daily. Take every day per patient      amLODipine 10 MG tablet  Commonly known as:  NORVASC  TAKE 1 TABLET BY MOUTH DAILY     aspirin 81 MG tablet  Take 81 mg by mouth daily.     furosemide 20 MG tablet  Commonly known as:  LASIX  Take 1 tablet (20 mg total) by mouth daily.     GERITOL COMPLETE PO  Take 1 tablet by mouth daily.     HYDROcodone-acetaminophen 5-325 MG tablet  Commonly known as:  NORCO/VICODIN  Take 1-2 tablets by mouth every 4 (four) hours as needed for moderate pain.     isosorbide mononitrate 30 MG 24 hr tablet  Commonly known as:  IMDUR  TAKE 1 TABLET(30 MG) BY MOUTH DAILY     LUMIGAN 0.01 % Soln  Generic drug:  bimatoprost  Place 1 drop into both eyes daily.     nitroGLYCERIN 0.4 MG SL tablet  Commonly known as:  NITROSTAT  Place 1 tablet (0.4 mg total) under the tongue every 5 (five) minutes as needed for chest pain.     oxyCODONE 5 MG immediate release tablet  Commonly known as:  ROXICODONE  Take 1 tablet (5 mg total) by mouth every 6 (six) hours as needed for breakthrough pain.     polyethylene glycol packet  Commonly known as:  MIRALAX / GLYCOLAX  Take 17 g by mouth daily.     potassium chloride SA 20 MEQ tablet  Commonly known as:  K-DUR,KLOR-CON  Take 20 mEq by mouth daily.     pravastatin 40 MG tablet  Commonly known as:  PRAVACHOL  Take 1 tablet (40 mg total) by mouth daily.     RESTASIS 0.05 % ophthalmic emulsion  Generic drug:  cycloSPORINE  Place 1 drop into both eyes daily.     spironolactone 25 MG tablet  Commonly known as:  ALDACTONE  TAKE 1/2 TABLET BY MOUTH TWICE DAILY     timolol 0.5 % ophthalmic gel-forming  Commonly known as:  TIMOPTIC-XR  Place 1 drop into both eyes daily.     traMADol 50 MG tablet  Commonly known as:  ULTRAM  Take 1 tablet (50 mg total) by mouth every 6 (six) hours as needed.           Follow-up Information    Schedule an appointment as soon as possible for a visit with Elyn Peers, MD.   Specialty:  Family Medicine   Why:  As  needed, If symptoms worsen   Contact information:   [address] [place]  [id number] [phone]        Time coordinating discharge: 25 minutes.  Signed:  RAMA,CHRISTINA  Pager [phone] [facility] 03/12/2016, 3:26 PM

## 2016-03-12 NOTE — Evaluation (Signed)
Physical Therapy Evaluation Patient Details Name: [name] MRN: [id number] DOB: 1941-01-14 Today's Date: 03/12/2016   History of Present Illness  75 y.o. male with h/o CAD, CHF, CKD, PVD, legally blind both eyes admitted with N/V/D and BLE pain. Dx of obstipation.   Clinical Impression  Pt is independent with mobility. He ambulated 300' without an assistive device with no loss of balance. No further PT needed. Pt is ready to DC home from PT standpoint. PT signing off.     Follow Up Recommendations No PT follow up    Equipment Recommendations  None recommended by PT    Recommendations for Other Services       Precautions / Restrictions Precautions Precautions: None Restrictions Weight Bearing Restrictions: No      Mobility  Bed Mobility Overal bed mobility: Independent                Transfers Overall transfer level: Independent                  Ambulation/Gait Ambulation/Gait assistance: Independent Ambulation Distance (Feet): 300 Feet Assistive device: None Gait Pattern/deviations: WFL(Within Functional Limits)   Gait velocity interpretation: at or above normal speed for age/gender General Gait Details: steady, no loss of balance  Stairs            Wheelchair Mobility    Modified Rankin (Stroke Patients Only)       Balance Overall balance assessment: Independent                                           Pertinent Vitals/Pain Pain Assessment: 0-10 Pain Score: 3  Pain Location: R calf Pain Descriptors / Indicators: Sore Pain Intervention(s): Monitored during session;Premedicated before session    Home Living Family/patient expects to be discharged to:: Private residence Living Arrangements: Spouse/significant other Available Help at Discharge: Available 24 hours/day;Family Type of Home: Apartment Home Access: Stairs to enter   [employer] of Steps: 11 Home Layout: One level Home Equipment:  Environmental consultant - 2 wheels;Cane - single point      Prior Function Level of Independence: Independent               Hand Dominance        Extremity/Trunk Assessment   Upper Extremity Assessment: Overall WFL for tasks assessed           Lower Extremity Assessment: Overall WFL for tasks assessed      Cervical / Trunk Assessment: Normal  Communication   Communication: No difficulties  Cognition Arousal/Alertness: Awake/alert Behavior During Therapy: WFL for tasks assessed/performed Overall Cognitive Status: Within Functional Limits for tasks assessed                      General Comments      Exercises        Assessment/Plan    PT Assessment Patent does not need any further PT services  PT Diagnosis     PT Problem List    PT Treatment Interventions     PT Goals (Current goals can be found in the Care Plan section) Acute Rehab PT Goals PT Goal Formulation: All assessment and education complete, DC therapy    Frequency     Barriers to discharge        Co-evaluation  End of Session Equipment Utilized During Treatment: Gait belt Activity Tolerance: Patient tolerated treatment well Patient left: in chair;with call bell/phone within reach Nurse Communication: Mobility status    Functional Assessment Tool Used: clinical judgement Functional Limitation: Mobility: Walking and moving around Mobility: Walking and Moving Around Current Status [phone]): 0 percent impaired, limited or restricted Mobility: Walking and Moving Around Goal Status [phone]): 0 percent impaired, limited or restricted Mobility: Walking and Moving Around Discharge Status [phone]): 0 percent impaired, limited or restricted    Time: 1305-1316 PT Time Calculation (min) (ACUTE ONLY): 11 min   Charges:   PT Evaluation $PT Eval Low Complexity: 1 Procedure     PT G Codes:   PT G-Codes **NOT FOR INPATIENT CLASS** Functional Assessment Tool Used: clinical  judgement Functional Limitation: Mobility: Walking and moving around Mobility: Walking and Moving Around Current Status [id number]): 0 percent impaired, limited or restricted Mobility: Walking and Moving Around Goal Status [phone]): 0 percent impaired, limited or restricted Mobility: Walking and Moving Around Discharge Status [phone]): 0 percent impaired, limited or restricted    Philomena Doheny 03/12/2016, 1:23 PM  [phone]

## 2016-03-12 NOTE — Progress Notes (Signed)
VASCULAR LAB PRELIMINARY  ARTERIAL  ABI completed:    RIGHT    LEFT    PRESSURE WAVEFORM  PRESSURE WAVEFORM  BRACHIAL 144 Triphasic BRACHIAL 138 Triphasic  DP 188 Triphasic DP 156 Triphasic  PT 143 Triphasic PT 165 Triphasic    RIGHT LEFT  ABI 1.31 1.15   ABIs and Doppler waveforms are within normal limits bilaterally at rest.  Sundy Houchins, RVS 03/12/2016, 12:47 PM

## 2016-03-12 NOTE — Care Management Note (Signed)
Case Management Note  Patient Details  Name: [name] MRN: [id number] Date of Birth: 31-Dec-1940  Subjective/Objective:             Presents with obstipation. From home with wife. Independent with ADL's PTA. No DME usage. [id number] [place].   History of CAD, CHF, CKD, PVD, legally blind both eyes admitted with N/V/D and BLE pain.   Action/Plan: Per PT's evaluation/recommendation: no needs identified. CM received order for home health services and pt declined. Plan is to discharge to home today assuming care for self with the help from family if needed.  Expected Discharge Date:   03/12/2016           Expected Discharge Plan:  Home/Self Care  In-House Referral:     Discharge planning Services  CM Consult  Post Acute Care Choice:    Choice offered to:     DME Arranged:    DME Agency:     HH Arranged:    HH Agency:     Status of Service:  Completed, signed off  Medicare Important Message Given:    Date Medicare IM Given:    Medicare IM give by:    Date Additional Medicare IM Given:    Additional Medicare Important Message give by:     If discussed at [place] of Stay Meetings, dates discussed:    Additional Comments:  Sharin Mons, [place] [phone] 03/12/2016, 2:55 PM

## 2016-03-16 LAB — CULTURE, BLOOD (ROUTINE X 2)
Culture: NO GROWTH
Culture: NO GROWTH

## 2016-03-23 ENCOUNTER — Other Ambulatory Visit: Payer: Self-pay | Admitting: Cardiovascular Disease

## 2016-03-23 NOTE — Telephone Encounter (Signed)
REFILL 

## 2016-04-29 DIAGNOSIS — I10 Essential (primary) hypertension: Secondary | ICD-10-CM | POA: Diagnosis not present

## 2016-04-29 DIAGNOSIS — H42 Glaucoma in diseases classified elsewhere: Secondary | ICD-10-CM | POA: Diagnosis not present

## 2016-04-29 DIAGNOSIS — E785 Hyperlipidemia, unspecified: Secondary | ICD-10-CM | POA: Diagnosis not present

## 2016-04-29 DIAGNOSIS — M549 Dorsalgia, unspecified: Secondary | ICD-10-CM | POA: Diagnosis not present

## 2016-05-02 ENCOUNTER — Other Ambulatory Visit: Payer: Self-pay | Admitting: Cardiovascular Disease

## 2016-05-03 ENCOUNTER — Other Ambulatory Visit: Payer: Self-pay | Admitting: *Deleted

## 2016-05-03 MED ORDER — AMLODIPINE BESYLATE 10 MG PO TABS: 10.0000 mg | ORAL_TABLET | Freq: Every day | ORAL | Status: DC

## 2016-05-06 ENCOUNTER — Encounter (HOSPITAL_COMMUNITY): Payer: Self-pay | Admitting: Neurology

## 2016-05-06 ENCOUNTER — Emergency Department (HOSPITAL_COMMUNITY)
Admission: EM | Admit: 2016-05-06 | Discharge: 2016-05-06 | Disposition: A | Discharge status: Home / Self Care | Payer: Medicare Other | Attending: Emergency Medicine | Admitting: Emergency Medicine

## 2016-05-06 DIAGNOSIS — I5022 Chronic systolic (congestive) heart failure: Secondary | ICD-10-CM | POA: Diagnosis not present

## 2016-05-06 DIAGNOSIS — M545 Low back pain, unspecified: Secondary | ICD-10-CM

## 2016-05-06 DIAGNOSIS — Z7982 Long term (current) use of aspirin: Secondary | ICD-10-CM | POA: Insufficient documentation

## 2016-05-06 DIAGNOSIS — N183 Chronic kidney disease, stage 3 (moderate): Secondary | ICD-10-CM | POA: Insufficient documentation

## 2016-05-06 DIAGNOSIS — I130 Hypertensive heart and chronic kidney disease with heart failure and stage 1 through stage 4 chronic kidney disease, or unspecified chronic kidney disease: Secondary | ICD-10-CM | POA: Insufficient documentation

## 2016-05-06 DIAGNOSIS — Z79899 Other long term (current) drug therapy: Secondary | ICD-10-CM | POA: Diagnosis not present

## 2016-05-06 DIAGNOSIS — I252 Old myocardial infarction: Secondary | ICD-10-CM | POA: Insufficient documentation

## 2016-05-06 DIAGNOSIS — I2510 Atherosclerotic heart disease of native coronary artery without angina pectoris: Secondary | ICD-10-CM | POA: Diagnosis not present

## 2016-05-06 DIAGNOSIS — Z87891 Personal history of nicotine dependence: Secondary | ICD-10-CM | POA: Insufficient documentation

## 2016-05-06 MED ORDER — BUPIVACAINE HCL 0.5 % IJ SOLN
10.0000 mL | Freq: Once | INTRAMUSCULAR | Status: AC
  Administered 2016-05-06: 10 mL
  Filled 2016-05-06: qty 10

## 2016-05-06 MED ORDER — ACETAMINOPHEN 500 MG PO TABS: 1000.0000 mg | ORAL_TABLET | Freq: Four times a day (QID) | ORAL | Status: DC | PRN

## 2016-05-06 NOTE — Discharge Instructions (Signed)
Back Exercises The following exercises strengthen the muscles that help to support the back. They also help to keep the lower back flexible. Doing these exercises can help to prevent back pain or lessen existing pain. If you have back pain or discomfort, try doing these exercises 2-3 times each day or as told by your health care provider. When the pain goes away, do them once each day, but increase the number of times that you repeat the steps for each exercise (do more repetitions). If you do not have back pain or discomfort, do these exercises once each day or as told by your health care provider. EXERCISES Single Knee to Chest Repeat these steps 3-5 times for each leg:  Lie on your back on a firm bed or the floor with your legs extended.  Bring one knee to your chest. Your other leg should stay extended and in contact with the floor.  Hold your knee in place by grabbing your knee or thigh.  Pull on your knee until you feel a gentle stretch in your lower back.  Hold the stretch for 10-30 seconds.  Slowly release and straighten your leg. Pelvic Tilt Repeat these steps 5-10 times:  Lie on your back on a firm bed or the floor with your legs extended.  Bend your knees so they are pointing toward the ceiling and your feet are flat on the floor.  Tighten your lower abdominal muscles to press your lower back against the floor. This motion will tilt your pelvis so your tailbone points up toward the ceiling instead of pointing to your feet or the floor.  With gentle tension and even breathing, hold this position for 5-10 seconds. Cat-Cow Repeat these steps until your lower back becomes more flexible:  Get into a hands-and-knees position on a firm surface. Keep your hands under your shoulders, and keep your knees under your hips. You may place padding under your knees for comfort.  Let your head hang down, and point your tailbone toward the floor so your lower back becomes rounded like the  back of a cat.  Hold this position for 5 seconds.  Slowly lift your head and point your tailbone up toward the ceiling so your back forms a sagging arch like the back of a cow.  Hold this position for 5 seconds. Press-Ups Repeat these steps 5-10 times:  Lie on your abdomen (face-down) on the floor.  Place your palms near your head, about shoulder-width apart.  While you keep your back as relaxed as possible and keep your hips on the floor, slowly straighten your arms to raise the top half of your body and lift your shoulders. Do not use your back muscles to raise your upper torso. You may adjust the placement of your hands to make yourself more comfortable.  Hold this position for 5 seconds while you keep your back relaxed.  Slowly return to lying flat on the floor. Bridges Repeat these steps 10 times:  Lie on your back on a firm surface.  Bend your knees so they are pointing toward the ceiling and your feet are flat on the floor.  Tighten your buttocks muscles and lift your buttocks off of the floor until your waist is at almost the same height as your knees. You should feel the muscles working in your buttocks and the back of your thighs. If you do not feel these muscles, slide your feet 1-2 inches farther away from your buttocks.  Hold this position for 3-5  seconds.  Slowly lower your hips to the starting position, and allow your buttocks muscles to relax completely. If this exercise is too easy, try doing it with your arms crossed over your chest. Abdominal Crunches Repeat these steps 5-10 times:  Lie on your back on a firm bed or the floor with your legs extended.  Bend your knees so they are pointing toward the ceiling and your feet are flat on the floor.  Cross your arms over your chest.  Tip your chin slightly toward your chest without bending your neck.  Tighten your abdominal muscles and slowly raise your trunk (torso) high enough to lift your shoulder blades a  tiny bit off of the floor. Avoid raising your torso higher than that, because it can put too much stress on your low back and it does not help to strengthen your abdominal muscles.  Slowly return to your starting position. Back Lifts Repeat these steps 5-10 times: 1. Lie on your abdomen (face-down) with your arms at your sides, and rest your forehead on the floor. 2. Tighten the muscles in your legs and your buttocks. 3. Slowly lift your chest off of the floor while you keep your hips pressed to the floor. Keep the back of your head in line with the curve in your back. Your eyes should be looking at the floor. 4. Hold this position for 3-5 seconds. 5. Slowly return to your starting position. SEEK MEDICAL CARE IF:  Your back pain or discomfort gets much worse when you do an exercise.  Your back pain or discomfort does not lessen within 2 hours after you exercise. If you have any of these problems, stop doing these exercises right away. Do not do them again unless your health care provider says that you can. SEEK IMMEDIATE MEDICAL CARE IF:  You develop sudden, severe back pain. If this happens, stop doing the exercises right away. Do not do them again unless your health care provider says that you can.   This information is not intended to replace advice given to you by your health care provider. Make sure you discuss any questions you have with your health care provider.   Document Released: 12/16/2004 Document Revised: 07/30/2015 Document Reviewed: 01/02/2015 Elsevier Interactive Patient Education 2016 [employer] Back Injury Prevention Back injuries can be very painful. They can also be difficult to heal. After having one back injury, you are more likely to injure your back again. It is important to learn how to avoid injuring or re-injuring your back. The following tips can help you to prevent a back injury. WHAT SHOULD I KNOW ABOUT PHYSICAL FITNESS?  Exercise for 30 minutes per day  on most days of the week or as directed by your health care provider. Make sure to:  Do aerobic exercises, such as walking, jogging, biking, or swimming.  Do exercises that increase balance and strength, such as tai chi and yoga. These can decrease your risk of falling and injuring your back.  Do stretching exercises to help with flexibility.  Try to develop strong abdominal muscles. Your abdominal muscles provide a lot of the support that is needed by your back.  Maintain a healthy weight. This helps to decrease your risk of a back injury. WHAT SHOULD I KNOW ABOUT MY DIET?  Talk with your health care provider about your overall diet. Take supplements and vitamins only as directed by your health care provider.  Talk with your health care provider about how much calcium and vitamin   D you need each day. These nutrients help to prevent weakening of the bones (osteoporosis). Osteoporosis can cause broken (fractured) bones, which lead to back pain.  Include good sources of calcium in your diet, such as dairy products, green leafy vegetables, and products that have had calcium added to them (fortified).  Include good sources of vitamin D in your diet, such as milk and foods that are fortified with vitamin D. WHAT SHOULD I KNOW ABOUT MY POSTURE?  Sit up straight and stand up straight. Avoid leaning forward when you sit or hunching over when you stand.  Choose chairs that have good low-back (lumbar) support.  If you work at a desk, sit close to it so you do not need to lean over. Keep your chin tucked in. Keep your neck drawn back, and keep your elbows bent at a right angle. Your arms should look like the letter "L."  Sit high and close to the steering wheel when you drive. Add a lumbar support to your car seat, if needed.  Avoid sitting or standing in one position for very long. Take breaks to get up, stretch, and walk around at least one time every hour. Take breaks every hour if you are  driving for long periods of time.  Sleep on your side with your knees slightly bent, or sleep on your back with a pillow under your knees. Do not lie on the front of your body to sleep. WHAT SHOULD I KNOW ABOUT LIFTING, TWISTING, AND REACHING? Lifting and Heavy Lifting  Avoid heavy lifting, especially repetitive heavy lifting. If you must do heavy lifting:  Stretch before lifting.  Work slowly.  Rest between lifts.  Use a tool such as a cart or a dolly to move objects if one is available.  Make several small trips instead of carrying one heavy load.  Ask for help when you need it, especially when moving big objects.  Follow these steps when lifting:  Stand with your feet shoulder-width apart.  Get as close to the object as you can. Do not try to pick up a heavy object that is far from your body.  Use handles or lifting straps if they are available.  Bend at your knees. Squat down, but keep your heels off the floor.  Keep your shoulders pulled back, your chin tucked in, and your back straight.  Lift the object slowly while you tighten the muscles in your legs, abdomen, and buttocks. Keep the object as close to the center of your body as possible.  Follow these steps when putting down a heavy load:  Stand with your feet shoulder-width apart.  Lower the object slowly while you tighten the muscles in your legs, abdomen, and buttocks. Keep the object as close to the center of your body as possible.  Keep your shoulders pulled back, your chin tucked in, and your back straight.  Bend at your knees. Squat down, but keep your heels off the floor.  Use handles or lifting straps if they are available. Twisting and Reaching  Avoid lifting heavy objects above your waist.  Do not twist at your waist while you are lifting or carrying a load. If you need to turn, move your feet.  Do not bend over without bending at your knees.  Avoid reaching over your head, across a table, or  for an object on a high surface. WHAT ARE SOME OTHER TIPS?  Avoid wet floors and icy ground. Keep sidewalks clear of ice to prevent falls.    falls.  Do not sleep on a mattress that is too soft or too hard.  Keep items that are used frequently within easy reach.  Put heavier objects on shelves at waist level, and put lighter objects on lower or higher shelves.  Find ways to decrease your stress, such as exercise, massage, or relaxation techniques. Stress can build up in your muscles. Tense muscles are more vulnerable to injury.  Talk with your health care provider if you feel anxious or depressed. These conditions can make back pain worse.  Wear flat heel shoes with cushioned soles.  Avoid sudden movements.  Use both shoulder straps when carrying a backpack.  Do not use any tobacco products, including cigarettes, chewing tobacco, or electronic cigarettes. If you need help quitting, ask your health care provider.   This information is not intended to replace advice given to you by your health care provider. Make sure you discuss any questions you have with your health care provider.   Document Released: 12/16/2004 Document Revised: 03/25/2015 Document Reviewed: 11/12/2014 Elsevier Interactive Patient Education [employer].

## 2016-05-06 NOTE — ED Notes (Signed)
Pt reports has been having right lower back pain since he picked up a stove 3 weeks ago. Went to PCP told he has pulled muscle, but wasn't given any meds. Pt is ambulatory. Denies uninary sx. No n/v/d.

## 2016-05-06 NOTE — ED Provider Notes (Signed)
CSN: [id number]     Arrival date & time 05/06/16  1143 History   First MD Initiated Contact with Patient 05/06/16 1253     Chief Complaint  Patient presents with  . Back Pain     (Consider location/radiation/quality/duration/timing/severity/associated sxs/prior Treatment) Patient is a 75 y.o. male presenting with back pain. The history is provided by the patient.  Back Pain Location:  Lumbar spine Quality:  Aching Radiates to:  Does not radiate Pain severity:  Moderate Pain is:  Same all the time Onset quality:  Sudden (carrying a stove going down stairs and strained himself dropping it noticing the pain later on) Duration:  2 weeks Timing:  Constant Progression:  Unchanged Chronicity:  New Relieved by: the shower. Worsened by:  Movement Associated symptoms: no bladder incontinence, no bowel incontinence, no fever and no numbness     Past Medical History  Diagnosis Date  . Colon polyp, hyperplastic   . Spondylolisthesis   . Hypertension   . Chronic systolic CHF (congestive heart failure) ([place])   . NICM (nonischemic cardiomyopathy) ([place])     a. Remote hx of dilated NICM with EF ranging 20-45%, including normal EF by echo (55-60%) in 2014.  Marland Kitchen CAD (coronary artery disease) 80% stenosis diag of the LAD, 30% in OM2 branch of LCX in 2009     a. Nonobstructive CAD by cath 11/2011 with the exception of the pre-existing diagonal branch #2 lesion.  Marland Kitchen PVC's (premature ventricular contractions)   . 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 Requiring discontinuation of BB dose.  . Myocardial infarction ([place]) 11/22/11  . Legally blind     "both eyes"  . CKD (chronic kidney disease) stage 3, GFR 30-59 ml/min   . History of stress test 06/01/2012    Normal myocardial perfusion study. compared to the previous study there is no significant change. this is a low risk scan  . Peripheral arterial disease ([place])     a. 06/2014: ABI right 0.99, left 1.2, LE dopplers revealing an occluded  right posterior tibial. As symptoms were not felt r/t claudication, no further w/u at the time.  . Ventricular bigeminy   . Ventricular tachycardia (paroxysmal) ([place]) 04/11/2015   Past Surgical History  Procedure Laterality Date  . Cystoscopy    . Rotator cuff repair  2000's    left  . Back surgery    . Cardiac catheterization  11/2011  . Posterior fusion lumbar spine  1979  . Cataract extraction, bilateral  1990's  . Ep study and ablation of vt  7/13    PVC focus mapped to the right coronary cusp of the aorta, limited ablation performed due to proximity of the focus to the right coronary artery  . Eye surgery    . Cystoscopy with urethral dilatation N/A 05/04/2013    Procedure: CYSTOSCOPY WITH URETHRAL DILATATION;  Surgeon: Eustace Moore, MD;  Location: [facility];  Service: Neurosurgery;  Laterality: N/A;  with insertion of foley catheter  . Cardiac catheterization  11/2011    didn't demonstrate high grade obstructive disease to account for his LV dysfunction.  . Left heart catheterization with coronary angiogram N/A 11/25/2011    Procedure: LEFT HEART CATHETERIZATION WITH CORONARY ANGIOGRAM;  Surgeon: Leonie Man, MD;  Location[facility] C[facility] LAB;  Service: Cardiovascular;  Laterality: N/A;  . V-tach ablation N/A 06/06/2012    Procedure: V-TACH ABLATION;  Surgeon: Thompson Grayer, MD;  Location: [facility] CATH LAB;  Service: Cardiovascular;  Laterality:  N/A;   Family History  Problem Relation Age of Onset  . Asthma Mother   . Other      Unsure if any heart disease in his family.   Social History  Substance Use Topics  . Smoking status: Former Smoker -- 50 years    Types: Cigarettes    Quit date: 11/23/1999  . Smokeless tobacco: Never Used  . 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  Gastrointestinal: Negative for bowel incontinence.  Genitourinary: Negative for bladder  incontinence.  Musculoskeletal: Positive for back pain.  Neurological: Negative for numbness.  All other systems reviewed and are negative.     Allergies  Review of patient's allergies indicates no known allergies.  Home Medications   Prior to Admission medications   Medication Sig Start Date End Date Taking? Authorizing Provider  acetaminophen (TYLENOL) 325 MG tablet Take 650 mg by mouth daily. Take every day per patient    Historical Provider, MD  amLODipine (NORVASC) 10 MG tablet Take 1 tablet (10 mg total) by mouth daily. 05/03/16   Troy Sine, MD  aspirin 81 MG tablet Take 81 mg by mouth daily.    Historical Provider, MD  furosemide (LASIX) 20 MG tablet Take 1 tablet (20 mg total) by mouth daily. 12/11/15   Troy Sine, MD  HYDROcodone-acetaminophen (NORCO/VICODIN) 5-325 MG per tablet Take 1-2 tablets by mouth every 4 (four) hours as needed for moderate pain. 02/19/15   Donne Hazel, MD  Iron-Vitamins (GERITOL COMPLETE PO) Take 1 tablet by mouth daily.    Historical Provider, MD  isosorbide mononitrate (IMDUR) 30 MG 24 hr tablet TAKE 1 TABLET(30 MG) BY MOUTH DAILY 12/02/15   Troy Sine, MD  LUMIGAN 0.01 % SOLN Place 1 drop into both eyes daily. 12/06/14   Historical Provider, MD  nitroGLYCERIN (NITROSTAT) 0.4 MG SL tablet Place 1 tablet (0.4 mg total) under the tongue every 5 (five) minutes as needed for chest pain. 02/26/15   Troy Sine, MD  oxyCODONE (ROXICODONE) 5 MG immediate release tablet Take 1 tablet (5 mg total) by mouth every 6 (six) hours as needed for breakthrough pain. 11/27/15   Esaw Grandchild, MD  polyethylene glycol [facility] / Floria Raveling) packet Take 17 g by mouth daily. 03/12/16   Venetia Maxon Rama, MD  potassium chloride SA (K-DUR,KLOR-CON) 20 MEQ tablet Take 20 mEq by mouth daily.    Historical Provider, MD  pravastatin (PRAVACHOL) 40 MG tablet Take 1 tablet (40 mg total) by mouth daily. 03/04/16   Troy Sine, MD  RESTASIS 0.05 % ophthalmic emulsion Place 1  drop into both eyes daily. 12/30/14   Historical Provider, MD  spironolactone (ALDACTONE) 25 MG tablet Take 0.5 tablets (12.5 mg total) by mouth 2 (two) times daily. KEEP OV. 03/23/16   Troy Sine, MD  timolol (TIMOPTIC-XR) 0.5 % ophthalmic gel-forming Place 1 drop into both eyes daily. 12/10/14   Historical Provider, MD  traMADol (ULTRAM) 50 MG tablet Take 1 tablet (50 mg total) by mouth every 6 (six) hours as needed. 02/26/16   Lajean Saver, MD   BP 126/75 mmHg  Pulse 56  Temp(Src) 98.2 F (36.8 C) (Oral)  Resp 18  Ht 5\' 8"  (1.727 m)  Wt 169 lb (76.658 kg)  BMI 25.70 kg/m2  SpO2 99% Physical Exam  Constitutional: He is oriented to person, place,  and time. He appears well-developed and well-nourished. No distress.  HENT:  Head: Normocephalic and atraumatic.  Eyes: Conjunctivae are normal.  Neck: Neck supple. No tracheal deviation present.  Cardiovascular: Normal rate and regular rhythm.   Pulmonary/Chest: Effort normal. No respiratory distress.  Abdominal: Soft. He exhibits no distension.  Musculoskeletal:       Lumbar back: He exhibits tenderness. He exhibits normal range of motion and no bony tenderness.       Back:  Neurological: He is alert and oriented to person, place, and time.  Skin: Skin is warm and dry.  Psychiatric: He has a normal mood and affect.  Vitals reviewed.   ED Course  Procedures (including critical care time)  Procedure Note: Trigger Point Injection for Myofascial pain  Performed by Dr. Laneta Simmers Indication: muscle/myofascial pain Muscle body and tendon sheath of the right lumbar paraspinal muscle(s) were injected with 0.5% bupivacaine under sterile technique for release of muscle spasm/pain. Patient tolerated well with immediate improvement of symptoms and no immediate complications following procedure.  CPT Code:   1 or 2 muscle bodies: 20552   Labs Review Labs Reviewed - No data to display  Imaging Review No results found. I have personally  reviewed and evaluated these images and lab results as part of my medical decision-making.   EKG Interpretation None      MDM   Final diagnoses:  Right-sided low back pain without sciatica   75 y.o. male presents with back pain in lumbar area since 2 weeks ago without signs of radicular pain after a lifting injury.  No red flag symptoms of fever, weight loss, saddle anesthesia, weakness, fecal/urinary incontinence or urinary retention. Offered local trigger point injection for symptomatic treatment. Suspect MSK etiology. No indication for imaging emergently. Patient was recommended to take short course of scheduled NSAIDs and engage in early mobility as definitive treatment. Return precautions discussed for worsening or new concerning symptoms.    Leo Grosser, MD 05/06/16 1314

## 2016-05-17 ENCOUNTER — Other Ambulatory Visit: Payer: Self-pay

## 2016-05-17 MED ORDER — PRAVASTATIN SODIUM 40 MG PO TABS: 40.0000 mg | ORAL_TABLET | Freq: Every day | ORAL | Status: DC

## 2016-05-31 DIAGNOSIS — M545 Low back pain: Secondary | ICD-10-CM | POA: Diagnosis not present

## 2016-06-01 ENCOUNTER — Other Ambulatory Visit: Payer: Self-pay | Admitting: Cardiovascular Disease

## 2016-06-09 ENCOUNTER — Ambulatory Visit (INDEPENDENT_AMBULATORY_CARE_PROVIDER_SITE_OTHER): Payer: Medicare Other | Admitting: Podiatry

## 2016-06-09 ENCOUNTER — Encounter: Payer: Self-pay | Admitting: Podiatry

## 2016-06-09 DIAGNOSIS — M79676 Pain in unspecified toe(s): Secondary | ICD-10-CM

## 2016-06-09 DIAGNOSIS — I739 Peripheral vascular disease, unspecified: Secondary | ICD-10-CM

## 2016-06-09 DIAGNOSIS — B351 Tinea unguium: Secondary | ICD-10-CM | POA: Diagnosis not present

## 2016-06-09 NOTE — Progress Notes (Signed)
Patient ID: [name], male   DOB: 13-Aug-1941, 75 y.o.   MRN: [id number]  Subjective: This patient presents for a scheduled visit complaining of elongated and thickened toenails which are comfortable walking wearing shoes and request toenail debridement Patient also complains of right inferior heel pain in the last month. He has had evaluation in emergency department for right lower extremity pain in April 2017 with negative DVT and Rx of tramadol dated for pain control 4 7 2017  Objective: Orientated 3 No open skin lesions bilaterally No peripheral edema The toenails are hypertrophic, elongated, discolored, deformed and tender direct palpation 6-10   Assessment: Symptomatic onychomycoses 6-10 Peripheral arterial disease evaluated by Dr. Gwenlyn Found   Plan: Debridement toenails 6-10 mechanically and electronically with slight being in the fifth right toe, treated with topical antibiotic ointment and Band-Aid. Patient instructed to remove Band-Aid 1-3 days and continue applying topical antibiotic ointment and Band-Aid until a scab forms   Reappoint 3 month

## 2016-06-09 NOTE — Patient Instructions (Signed)
The Band-Aid on the fifth right toe on 1-3 days and continue apply topical antibiotic ointment and a Band-Aid until a scab forms

## 2016-06-14 ENCOUNTER — Encounter: Payer: Self-pay | Admitting: Cardiovascular Disease

## 2016-06-14 ENCOUNTER — Ambulatory Visit (INDEPENDENT_AMBULATORY_CARE_PROVIDER_SITE_OTHER): Payer: Medicare Other | Admitting: Cardiovascular Disease

## 2016-06-14 VITALS — BP 120/58 | HR 61 | Ht 68.0 in | Wt 174.0 lb

## 2016-06-14 DIAGNOSIS — I2510 Atherosclerotic heart disease of native coronary artery without angina pectoris: Secondary | ICD-10-CM

## 2016-06-14 DIAGNOSIS — N183 Chronic kidney disease, stage 3 unspecified: Secondary | ICD-10-CM

## 2016-06-14 DIAGNOSIS — E785 Hyperlipidemia, unspecified: Secondary | ICD-10-CM

## 2016-06-14 DIAGNOSIS — I440 Atrioventricular block, first degree: Secondary | ICD-10-CM

## 2016-06-14 NOTE — Patient Instructions (Signed)
Your physician wants you to follow-up in:  March 2018. You will receive a reminder letter in the mail two months in advance. If you don't receive a letter, please call our office to schedule the follow-up appointment.   If you need a refill on your cardiac medications before your next appointment, please call your pharmacy.

## 2016-06-15 ENCOUNTER — Encounter: Payer: Self-pay | Admitting: Cardiovascular Disease

## 2016-06-15 NOTE — Progress Notes (Signed)
Patient ID: [name], male   DOB: 10-Jul-1941, 75 y.o.   MRN: [id number]      Primary M.D.: Dr. Darlyne Russian  HPI: [name] is a 75 y.o. male who presents for a 6 month followup cardiology evaluation.  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 In  December 2012 he presented with acute congestive heart failure requiring BiPAP therapy and during his hospitalization ejection fraction dropped to 20-25%. He ruled in for non-ST segment elevation MI and was not found to have high-grade obstructive CAD. He did wear life-vest transiently and with improvement of his ejection fraction to approximately 35-40% in April 2013 his life as was discontinued. He also has a history of intermittent second-degree AV block lead leading to discontinuance of his beta blocker therapy and reduction of his amiodarone dose. An echo Doppler study in March 2014 revealed an ejection fraction that had increased to approximately 55%. 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  He was hospitalized in May 2016 with chest pain.  His cardiac enzymes were negative for MI.  He had an exercise Myoview study on 04/10/2015 and on the stress portion of the study he had episodes of nonsustained VT.  He did not have chest pain or diagnostic ECG changes and the result was felt to be low risk. During that hospitalization a follow-up echo Doppler study showed an ejection fraction that had improved to 45-50% with inferior hypokinesis.  There was mild LVH and grade 1 diastolic dysfunction.  During his hospitalization he did have some bradycardia.  Since last saw him, he continues to feel well.  Specifically denies any chest pain.  He denies PND or orthopnea.  He is unaware of any palpitations.  He denies significant edema.  He presents for reevaluation.  Past Medical History:  Diagnosis Date  . CAD (coronary artery disease) 80% stenosis diag of the LAD, 30% in OM2 branch of LCX in  2009    a. Nonobstructive CAD by cath 11/2011 with the exception of the pre-existing diagonal branch #2 lesion.  . Chronic systolic CHF (congestive heart failure) ([place])   . CKD (chronic kidney disease) stage 3, GFR 30-59 ml/min   . Colon polyp, hyperplastic   . History of stress test 06/01/2012   Normal myocardial perfusion study. compared to the previous study there is no significant change. this is a low risk scan  . Hypertension   . Legally blind    "both eyes"  . Myocardial infarction ([place]) 11/22/11  . NICM (nonischemic cardiomyopathy) ([place])    a. Remote hx of dilated NICM with EF ranging 20-45%, including normal EF by echo (55-60%) in 2014.  Marland Kitchen Peripheral arterial disease ([place])    a. 06/2014: ABI right 0.99, left 1.2, LE dopplers revealing an occluded right posterior tibial. As symptoms were not felt r/t claudication, no further w/u at the time.  Marland Kitchen PVC's (premature ventricular contractions)   . 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 Requiring discontinuation of BB dose.  Marland Kitchen Spondylolisthesis   . Ventricular bigeminy   . Ventricular tachycardia (paroxysmal) ([place]) 04/11/2015    Past Surgical History:  Procedure Laterality Date  . BACK SURGERY    . CARDIAC CATHETERIZATION  11/2011  . CARDIAC CATHETERIZATION  11/2011   didn't demonstrate high grade obstructive disease to account for his LV dysfunction.  Marland Kitchen CATARACT EXTRACTION, BILATERAL  1990's  . CYSTOSCOPY    . CYSTOSCOPY WITH  URETHRAL DILATATION N/A 05/04/2013   Procedure: CYSTOSCOPY WITH URETHRAL DILATATION;  Surgeon: Eustace Moore, MD;  Location: [place];  Service: Neurosurgery;  Laterality: N/A;  with insertion of foley catheter  . EP study and ablation of VT  7/13   PVC focus mapped to the right coronary cusp of the aorta, limited ablation performed due to proximity of the focus to the right coronary artery  . EYE SURGERY    . LEFT HEART CATHETERIZATION WITH CORONARY ANGIOGRAM N/A 11/25/2011   Procedure: LEFT HEART  CATHETERIZATION WITH CORONARY ANGIOGRAM;  Surgeon: Leonie Man, MD;  Location: [facility] CATH LAB;  Service: Cardiovascular;  Laterality: N/A;  . POSTERIOR FUSION [place]  . ROTATOR CUFF REPAIR  2000's   left  . V-TACH ABLATION N/A 06/06/2012   Procedure: V-TACH ABLATION;  Surgeon: Thompson Grayer, MD;  Location: [facility] CATH LAB;  Service: Cardiovascular;  Laterality: N/A;    No Known Allergies  Current Outpatient Prescriptions  Medication Sig Dispense Refill  . amLODipine (NORVASC) 10 MG tablet Take 1 tablet (10 mg total) by mouth daily. 30 tablet 11  . aspirin 81 MG tablet Take 81 mg by mouth daily.    . furosemide (LASIX) 20 MG tablet TAKE 1 TABLET(20 MG) BY MOUTH DAILY 30 tablet 0  . HYDROcodone-acetaminophen (NORCO/VICODIN) 5-325 MG per tablet Take 1-2 tablets by mouth every 4 (four) hours as needed for moderate pain. 20 tablet 0  . Iron-Vitamins (GERITOL COMPLETE PO) Take 1 tablet by mouth daily.    . isosorbide mononitrate (IMDUR) 30 MG 24 hr tablet TAKE 1 TABLET(30 MG) BY MOUTH DAILY 15 tablet 12  . LUMIGAN 0.01 % SOLN Place 1 drop into both eyes daily.  0  . nitroGLYCERIN (NITROSTAT) 0.4 MG SL tablet Place 1 tablet (0.4 mg total) under the tongue every 5 (five) minutes as needed for chest pain. 25 tablet 3  . oxyCODONE (ROXICODONE) 5 MG immediate release tablet Take 1 tablet (5 mg total) by mouth every 6 (six) hours as needed for breakthrough pain. 15 tablet 0  . polyethylene glycol (MIRALAX / GLYCOLAX) packet Take 17 g by mouth daily. 30 each 3  . potassium chloride SA (K-DUR,KLOR-CON) 20 MEQ tablet Take 20 mEq by mouth daily.    . pravastatin (PRAVACHOL) 40 MG tablet Take 1 tablet (40 mg total) by mouth daily. 30 tablet 3  . RESTASIS 0.05 % ophthalmic emulsion Place 1 drop into both eyes daily.  11  . spironolactone (ALDACTONE) 25 MG tablet Take 0.5 tablets (12.5 mg total) by mouth 2 (two) times daily. KEEP OV. 30 tablet 3  . timolol (TIMOPTIC-XR) 0.5 % ophthalmic gel-forming  Place 1 drop into both eyes daily.  5  . traMADol (ULTRAM) 50 MG tablet Take 1 tablet (50 mg total) by mouth every 6 (six) hours as needed. 15 tablet 0   No current facility-administered medications for this visit.     Social History   Social History  . Marital status: Legally Separated    Spouse name: N/A  . Number of children: 3  . Years of education: N/A   Occupational History  . Retired Retired   Social History Main Topics  . Smoking status: Former Smoker    Years: 50.00    Types: Cigarettes    Quit date: 11/23/1999  . Smokeless tobacco: Never Used  . 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  Drug use: No  . Sexual activity: Yes   Other Topics Concern  . Not on file   Social History Narrative  . No narrative on file    Socially, he is divorced and has 4 children, and 7 grandchildren.  ROS General: Negative; No fevers, chills, or night sweats;  HEENT: Negative; No changes in vision or hearing, sinus congestion, difficulty swallowing Pulmonary: Negative; No cough, wheezing, shortness of breath, hemoptysis Cardiovascular:  See history of present illness GI: He has a history of GERD, which is controlled with pantoprazole No nausea, vomiting, diarrhea, or abdominal pain GU: Negative; No dysuria, hematuria, or difficulty voiding Musculoskeletal: Negative; no myalgias, joint pain, or weakness Hematologic/Oncology: Negative; no easy bruising, bleeding Endocrine: Negative; no heat/cold intolerance; no diabetes Neuro: Negative; no changes in balance, headaches Skin: Negative; No rashes or skin lesions Psychiatric: Negative; No behavioral problems, depression Sleep: Negative; No snoring, daytime sleepiness, hypersomnolence, bruxism, restless legs, hypnogognic hallucinations, no cataplexy Other comprehensive 14 point system review is negative.   PE BP (!) 120/58   Pulse 61   Ht '5\' 8"'  (1.727 m)   Wt 174 lb (78.9 kg)    BMI 26.46 kg/m    Wt Readings from Last 3 Encounters:  06/14/16 174 lb (78.9 kg)  05/06/16 169 lb (76.7 kg)  03/11/16 170 lb 6.7 oz (77.3 kg)   General: Alert, oriented, no distress.  Skin: normal turgor, no rashes HEENT: Normocephalic, atraumatic. Pupils round and reactive; sclera anicteric;no lid lag.  Nose without nasal septal hypertrophy Mouth/Parynx benign; Mallinpatti scale 3 Chest wall: Nontender to palpation Neck: No JVD, no carotid bruits with normal carotid upstroke. Lungs: clear to ausculatation and percussion; no wheezing or rales Heart: RRR, s1 s2 normal 1/6 systolic murmur. No S3 or S4 gallop; no diastolic murmur. No rubs thrills or heaves Abdomen: soft, nontender; no hepatosplenomehaly, BS+; abdominal aorta nontender and not dilated by palpation. No CVA tenderness Pulses 2+ Extremities: no clubbing cyanosis or edema, Homan's sign negative  Neurologic: grossly nonfocal Psychological: Normal affect and mood  ECG (independently read by me): Sinus rhythm.  [name] 1 bpm without ectopy.  First-degree AV block.  Nonspecific ST-T changes.  January 2017 ECG (independently read by me):  Normal sinus rhythm at 62 bpm. First-degree AV block. Isolated PVC.  August 2015 ECG (independently read by me): Sinus rhythm at 61 beats per minute with a mild sinus arrhythmia and first-degree AV block.  Nonspecific ST change  Prior 01/01/2014 ECG (independently read by me): Sinus bradycardia with marked first-degree AV block with PR interval at 348 ms. QTc interval 457 ms. No significant ST-T changes.  Prior ECG from 06/27/2013: Normal sinus rhythm at 64 beats per minute with first-degree AV block. Voltaren 6 ms. QTC interval 464 ms.  LABS: BMP Latest Ref Rng & Units 03/12/2016 03/11/2016 03/10/2016  Glucose 65 - 99 mg/dL 115(H) 126(H) 169(H)  BUN 6 - 20 mg/dL '15 18 18  ' Creatinine 0.61 - 1.24 mg/dL 1.37(H) 1.53(H) 1.67(H)  Sodium 135 - 145 mmol/L 140 142 141  Potassium 3.5 - 5.1 mmol/L 4.2  4.4 3.7  Chloride 101 - 111 mmol/L 109 107 104  CO2 22 - 32 mmol/L '22 22 22  ' Calcium 8.9 - 10.3 mg/dL 8.8(L) 8.9 10.0   Hepatic Function Latest Ref Rng & Units 03/11/2016 03/10/2016 12/04/2015  Total Protein 6.5 - 8.1 g/dL 7.6 8.4(H) 7.2  Albumin 3.5 - 5.0 g/dL 3.6 4.2 4.0  AST 15 - 41 U/L 52(H) 30 20  ALT 17 -  63 U/L 44 22 18  Alk Phosphatase 38 - 126 U/L 60 67 61  Total Bilirubin 0.3 - 1.2 mg/dL 1.8(H) 1.2 0.6  Bilirubin, Direct 0.0 - 0.3 mg/dL - - -   CBC Latest Ref Rng & Units 03/11/2016 03/10/2016 02/26/2016  WBC 4.0 - 10.5 K/uL 13.6(H) 10.3 5.9  Hemoglobin 13.0 - 17.0 g/dL 14.3 15.6 14.0  Hematocrit 39.0 - 52.0 % 42.6 44.6 41.8  Platelets 150 - 400 K/uL 163 163 132(L)   Lab Results  Component Value Date   MCV 87.8 03/11/2016   MCV 86.9 03/10/2016   MCV 88.2 02/26/2016   Lab Results  Component Value Date   TSH 0.722 03/11/2016   Lab Results  Component Value Date   HGBA1C 5.9 (H) 05/26/2012   Lipid Panel     Component Value Date/Time   CHOL 83 (L) 12/04/2015 1102   TRIG 56 12/04/2015 1102   HDL 42 12/04/2015 1102   CHOLHDL 2.0 12/04/2015 1102   VLDL 11 12/04/2015 1102   LDLCALC 30 12/04/2015 1102     RADIOLOGY: No results found.    ASSESSMENT AND PLAN: Mr. Southwood Is a 75 year old African-American male who has a history of a nonischemic cardiomyopathy with an ejection fraction in the past at 25%. His LV function has normalized on current medical regimen and on his last echo Doppler assessment of May 2016 was 45 - 50%. Presently, he is not having any signs of CHF or volume overload. His blood pressure today is well controlled on amlodipine 10 mg, furosemide 20 mg,  spironolactone 12.5 twice a day. He is not having any chest pain symptoms on his current dose of 30 mg  isosorbide mononitrate. He does have significant first-degree AV block which has been present previously.   Remotely, his amiodarone was reduced and discontinued due to bradycardia and his heart block.  He  has a history of stage III chronic kidney disease, and most recent laboratory revealed improvement in his creatinine to 1.37.  He has been maintained on pravastatin 40 mg daily.  He had minimal AST elevation with a normal ALT in April 2017.  He sees Dr. Criss Rosales for primary care who checks his laboratory.  If this continues to be elevated.  his pravastatin dose may need to be decreased.  Next June 2018 he will be going to a family wedding  in [place] and he was wondering if he would be stable to fly on an airplane.  Presently, he is well compensated with reference to his CHF.  I will see him in the office in February 2018 for reevaluation.  Time spent: 25 minutes  Troy Sine, MD, [facility]  06/15/2016 4:20 PM

## 2016-06-29 DIAGNOSIS — M545 Low back pain: Secondary | ICD-10-CM | POA: Diagnosis not present

## 2016-07-02 ENCOUNTER — Other Ambulatory Visit: Payer: Self-pay | Admitting: Cardiovascular Disease

## 2016-07-02 NOTE — Telephone Encounter (Signed)
REFILL 

## 2016-07-03 ENCOUNTER — Other Ambulatory Visit: Payer: Self-pay | Admitting: Cardiovascular Disease

## 2016-07-16 ENCOUNTER — Other Ambulatory Visit: Payer: Self-pay | Admitting: Cardiovascular Disease

## 2016-07-16 NOTE — Telephone Encounter (Signed)
Rx(s) sent to pharmacy electronically.  

## 2016-07-25 ENCOUNTER — Other Ambulatory Visit: Payer: Self-pay | Admitting: Cardiovascular Disease

## 2016-07-27 DIAGNOSIS — H42 Glaucoma in diseases classified elsewhere: Secondary | ICD-10-CM | POA: Diagnosis not present

## 2016-07-27 DIAGNOSIS — Z0000 Encounter for general adult medical examination without abnormal findings: Secondary | ICD-10-CM | POA: Diagnosis not present

## 2016-07-27 DIAGNOSIS — I10 Essential (primary) hypertension: Secondary | ICD-10-CM | POA: Diagnosis not present

## 2016-07-27 DIAGNOSIS — E785 Hyperlipidemia, unspecified: Secondary | ICD-10-CM | POA: Diagnosis not present

## 2016-08-19 DIAGNOSIS — H401131 Primary open-angle glaucoma, bilateral, mild stage: Secondary | ICD-10-CM | POA: Diagnosis not present

## 2016-08-19 DIAGNOSIS — H18413 Arcus senilis, bilateral: Secondary | ICD-10-CM | POA: Diagnosis not present

## 2016-08-19 DIAGNOSIS — Z961 Presence of intraocular lens: Secondary | ICD-10-CM | POA: Diagnosis not present

## 2016-08-19 DIAGNOSIS — H401130 Primary open-angle glaucoma, bilateral, stage unspecified: Secondary | ICD-10-CM | POA: Diagnosis not present

## 2016-09-07 ENCOUNTER — Encounter (INDEPENDENT_AMBULATORY_CARE_PROVIDER_SITE_OTHER): Payer: Medicare Other | Admitting: Podiatry

## 2016-09-07 NOTE — Progress Notes (Signed)
This encounter was created in error - please disregard.

## 2016-09-08 ENCOUNTER — Encounter: Payer: Self-pay | Admitting: Podiatry

## 2016-09-08 ENCOUNTER — Ambulatory Visit (INDEPENDENT_AMBULATORY_CARE_PROVIDER_SITE_OTHER): Payer: Medicare Other | Admitting: Podiatry

## 2016-09-08 DIAGNOSIS — B351 Tinea unguium: Secondary | ICD-10-CM | POA: Diagnosis not present

## 2016-09-08 DIAGNOSIS — M79676 Pain in unspecified toe(s): Secondary | ICD-10-CM

## 2016-09-08 NOTE — Progress Notes (Signed)
Patient ID: [name], male   DOB: 1941-06-29, 75 y.o.   MRN: [id number]    Subjective: This patient presents for a scheduled visit complaining of elongated and thickened toenails which are comfortable walking wearing shoes and request toenail debridement Patient also complains of right inferior heel pain in the last month. He has had evaluation in emergency department for right lower extremity pain in April 2017 with negative DVT and Rx of tramadol dated for pain control 4 7 2017  Objective: Orientated 3 Palpable pedal pulses bilaterally Capillary reflex immediate bilaterally Sensation to 10 g monofilament wire intact 5/5 bilaterally Vibratory sensation nonreactive bilaterally Ankle reflexes weakly reactive bilaterally HAV bilaterally No open skin lesions bilaterally No peripheral edema The toenails are hypertrophic, elongated, discolored, deformed and tender direct palpation 6-10   Assessment: Symptomatic onychomycoses 6-10 Peripheral arterial disease evaluated by Dr. Gwenlyn Found   Plan: Debridement toenails 6-10 mechanically and electronically without any bleeding  Reappoint 3 months

## 2016-09-08 NOTE — Progress Notes (Signed)
   Subjective:    Patient ID: [name], male    DOB: 1941/05/11, 75 y.o.   MRN: [id number]  HPI "Cut my nails."    Review of Systems     Objective:   Physical Exam        Assessment & Plan:

## 2016-10-19 DIAGNOSIS — H35033 Hypertensive retinopathy, bilateral: Secondary | ICD-10-CM | POA: Diagnosis not present

## 2016-10-19 DIAGNOSIS — H401133 Primary open-angle glaucoma, bilateral, severe stage: Secondary | ICD-10-CM | POA: Diagnosis not present

## 2016-10-19 DIAGNOSIS — H35371 Puckering of macula, right eye: Secondary | ICD-10-CM | POA: Diagnosis not present

## 2016-10-26 ENCOUNTER — Other Ambulatory Visit: Payer: Self-pay | Admitting: Cardiovascular Disease

## 2016-10-26 DIAGNOSIS — N189 Chronic kidney disease, unspecified: Secondary | ICD-10-CM | POA: Diagnosis not present

## 2016-10-26 DIAGNOSIS — E785 Hyperlipidemia, unspecified: Secondary | ICD-10-CM | POA: Diagnosis not present

## 2016-10-26 DIAGNOSIS — I10 Essential (primary) hypertension: Secondary | ICD-10-CM | POA: Diagnosis not present

## 2016-10-26 DIAGNOSIS — H42 Glaucoma in diseases classified elsewhere: Secondary | ICD-10-CM | POA: Diagnosis not present

## 2016-10-26 NOTE — Telephone Encounter (Signed)
Rx(s) sent to pharmacy electronically.  

## 2016-11-17 ENCOUNTER — Emergency Department (HOSPITAL_COMMUNITY)
Admission: EM | Admit: 2016-11-17 | Discharge: 2016-11-17 | Disposition: A | Discharge status: Home / Self Care | Payer: Medicare Other | Attending: Emergency Medicine | Admitting: Emergency Medicine

## 2016-11-17 ENCOUNTER — Encounter (HOSPITAL_COMMUNITY): Payer: Self-pay | Admitting: Vascular Surgery

## 2016-11-17 ENCOUNTER — Emergency Department (HOSPITAL_COMMUNITY): Payer: Medicare Other

## 2016-11-17 DIAGNOSIS — W208XXA Other cause of strike by thrown, projected or falling object, initial encounter: Secondary | ICD-10-CM | POA: Diagnosis not present

## 2016-11-17 DIAGNOSIS — Y939 Activity, unspecified: Secondary | ICD-10-CM | POA: Insufficient documentation

## 2016-11-17 DIAGNOSIS — I5023 Acute on chronic systolic (congestive) heart failure: Secondary | ICD-10-CM | POA: Diagnosis not present

## 2016-11-17 DIAGNOSIS — Z7982 Long term (current) use of aspirin: Secondary | ICD-10-CM | POA: Insufficient documentation

## 2016-11-17 DIAGNOSIS — I252 Old myocardial infarction: Secondary | ICD-10-CM | POA: Insufficient documentation

## 2016-11-17 DIAGNOSIS — Z87891 Personal history of nicotine dependence: Secondary | ICD-10-CM | POA: Diagnosis not present

## 2016-11-17 DIAGNOSIS — I130 Hypertensive heart and chronic kidney disease with heart failure and stage 1 through stage 4 chronic kidney disease, or unspecified chronic kidney disease: Secondary | ICD-10-CM | POA: Insufficient documentation

## 2016-11-17 DIAGNOSIS — Z23 Encounter for immunization: Secondary | ICD-10-CM | POA: Diagnosis not present

## 2016-11-17 DIAGNOSIS — S0990XA Unspecified injury of head, initial encounter: Secondary | ICD-10-CM

## 2016-11-17 DIAGNOSIS — S060X0A Concussion without loss of consciousness, initial encounter: Secondary | ICD-10-CM | POA: Diagnosis not present

## 2016-11-17 DIAGNOSIS — N183 Chronic kidney disease, stage 3 (moderate): Secondary | ICD-10-CM | POA: Diagnosis not present

## 2016-11-17 DIAGNOSIS — Z79899 Other long term (current) drug therapy: Secondary | ICD-10-CM | POA: Diagnosis not present

## 2016-11-17 DIAGNOSIS — Y999 Unspecified external cause status: Secondary | ICD-10-CM | POA: Insufficient documentation

## 2016-11-17 DIAGNOSIS — I2510 Atherosclerotic heart disease of native coronary artery without angina pectoris: Secondary | ICD-10-CM | POA: Insufficient documentation

## 2016-11-17 DIAGNOSIS — Y929 Unspecified place or not applicable: Secondary | ICD-10-CM | POA: Diagnosis not present

## 2016-11-17 DIAGNOSIS — S0101XA Laceration without foreign body of scalp, initial encounter: Secondary | ICD-10-CM | POA: Diagnosis not present

## 2016-11-17 MED ORDER — TETANUS-DIPHTH-ACELL PERTUSSIS 5-2.5-18.5 LF-MCG/0.5 IM SUSP
0.5000 mL | Freq: Once | INTRAMUSCULAR | Status: AC
  Administered 2016-11-17: 0.5 mL via INTRAMUSCULAR
  Filled 2016-11-17: qty 0.5

## 2016-11-17 MED ORDER — ACETAMINOPHEN 325 MG PO TABS
650.0000 mg | ORAL_TABLET | Freq: Once | ORAL | Status: AC
  Administered 2016-11-17: 650 mg via ORAL
  Filled 2016-11-17: qty 2

## 2016-11-17 NOTE — ED Triage Notes (Signed)
Pt reports to the ED for eval of head injury. He reports he was sitting in his chair last night and a large picture fell off the wall and struck him in the head. 2-3 in laceration noted to head. Bleeding controlled and scabbed over at this time. Pt denies any LOC. He is on ASA q day. Denies any neck pain, vision changes, or N/V.

## 2016-11-17 NOTE — ED Provider Notes (Signed)
[place] [facility] DEPT Provider Note   CSN: [id number] Arrival date & time: 11/17/16  1118  By signing my name below, I, Reola Mosher, attest that this documentation has been prepared under the direction and in the presence of Blanchie Dessert, MD. Electronically Signed: Reola Mosher, ED Scribe. 11/17/16. 12:55 PM.  History   Chief Complaint Chief Complaint  Patient presents with  . Head Injury   The history is provided by the patient. No language interpreter was used.    HPI Comments: [name] is a 75 y.o. male with a PMHx of CHF, CAD w/ prior cardiac cath, and PVT,  who presents to the Emergency Department following sustaining an injury to the head which occurred approximately 21 hours ago. Pt reports that he hung a picture above his couch yesterday, and while he was sitting under it it fell off of the wall and struck him over the right-sided of his head, sustaining moderate pain and a wound to the area. He denies LOC or any fall event otherwise. Pt notes that the glass of the picture frame did shatter during this event, however, he denies any known foreign body involvement to his wound. He applied peroxide to the area directly following the incident, and bleeding has since resolved. Pt notes following this he was otherwise asymtopmatic until this morning when he woke up with mild dizziness, which has been intermittently coming and going all morning. He additionally notes that he has had a moderate, throbbing headache, and he was unable to ambulate as steady as he would at his baseline. He also states that his vision is more blurred from his baseline of wearing corrective lenses. He has been complaint with all of his daily cardiac medications. Pt takes 81mg  Asprin daily, but is not on anticoagulant/antiplatelet therapy otherwise. Pt is SOB at baseline related to his cardiac history, but denies this being exacerbated by this injury incident. He denies nausea, vomiting, chest  pain, neck pain, focal numbness/weakness, or any other associated symptoms. Tetanus is not UTD.   Past Medical History:  Diagnosis Date  . CAD (coronary artery disease) 80% stenosis diag of the LAD, 30% in OM2 branch of LCX in 2009    a. Nonobstructive CAD by cath 11/2011 with the exception of the pre-existing diagonal branch #2 lesion.  . Chronic systolic CHF (congestive heart failure) ([place])   . CKD (chronic kidney disease) stage 3, GFR 30-59 ml/min   . Colon polyp, hyperplastic   . History of stress test 06/01/2012   Normal myocardial perfusion study. compared to the previous study there is no significant change. this is a low risk scan  . Hypertension   . Legally blind    "both eyes"  . Myocardial infarction 11/22/11  . NICM (nonischemic cardiomyopathy) ([place])    a. Remote hx of dilated NICM with EF ranging 20-45%, including normal EF by echo (55-60%) in 2014.  Marland Kitchen Peripheral arterial disease ([place])    a. 06/2014: ABI right 0.99, left 1.2, LE dopplers revealing an occluded right posterior tibial. As symptoms were not felt r/t claudication, no further w/u at the time.  Marland Kitchen PVC's (premature ventricular contractions)   . 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 Requiring discontinuation of BB dose.  Marland Kitchen Spondylolisthesis   . Ventricular bigeminy   . Ventricular tachycardia (paroxysmal) ([place]) 04/11/2015   Patient Active Problem List   Diagnosis Date Noted  . Unsteady gait 03/12/2016  . Nausea vomiting and diarrhea 03/11/2016  . UTI (  lower urinary tract infection) 03/11/2016  . Gastroenteritis 03/11/2016  . Obstipation 03/11/2016  . Abdominal pain   . Constipation   . Lactic acidosis   . Leg pain   . Essential hypertension 03/04/2016  . Ventricular tachycardia (paroxysmal) ([place]) 04/11/2015  . Chest pain with moderate risk for cardiac etiology 04/09/2015  . CAP (community acquired pneumonia) 02/18/2015  . Chest pain at rest 02/18/2015  . Peripheral arterial disease ([place]) 08/15/2014  .  Hyperlipidemia with target LDL less than 70 01/01/2014  . GERD (gastroesophageal reflux disease) 01/01/2014  . First degree AV block 07/31/2012  . Unstable angina, negative MI 06/05/2012  . Acute on chronic systolic CHF (congestive heart failure) ([place]) 06/05/2012  . 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 CKD (chronic kidney disease) stage 3, GFR 30-59 ml/min 05/27/2012  . 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 Chronic systolic heart failure ([place]) 11/23/2011    Class: Diagnosis of  . CAD (coronary artery disease)  11/22/2011  . Premature ventricular contractions 11/22/2011  . Esophageal reflux 07/28/2011   Past Surgical History:  Procedure Laterality Date  . BACK SURGERY    . CARDIAC CATHETERIZATION  11/2011  . CARDIAC CATHETERIZATION  11/2011   didn't demonstrate high grade obstructive disease to account for his LV dysfunction.  Marland Kitchen CATARACT EXTRACTION, BILATERAL  1990's  . CYSTOSCOPY    . CYSTOSCOPY WITH URETHRAL DILATATION N/A 05/04/2013   Procedure: CYSTOSCOPY WITH URETHRAL DILATATION;  Surgeon: Eustace Moore, MD;  Location: [facility];  Service: Neurosurgery;  Laterality: N/A;  with insertion of foley catheter  . EP study and ablation of VT  7/13   PVC focus mapped to the right coronary cusp of the aorta, limited ablation performed due to proximity of the focus to the right coronary artery  . EYE SURGERY    . LEFT HEART CATHETERIZATION WITH CORONARY ANGIOGRAM N/A 11/25/2011   Procedure: LEFT HEART CATHETERIZATION WITH CORONARY ANGIOGRAM;  Surgeon: Leonie Man, MD;  Location: [facility] CATH LAB;  Service: Cardiovascular;  Laterality: N/A;  . POSTERIOR FUSION [place]  . ROTATOR CUFF REPAIR  2000's   left  . V-TACH ABLATION N/A 06/06/2012   Procedure: V-TACH ABLATION;  Surgeon: Thompson Grayer, MD;  [facility] C[facility] LAB;  Service: Cardiovascular;  Laterality: N/A;    Home Medications      Prior to Admission medications   Medication Sig Start Date End Date Taking? Authorizing Provider  amLODipine (NORVASC) 10 MG tablet Take 1 tablet (10 mg total) by mouth daily. 05/03/16   Troy Sine, MD  aspirin 81 MG tablet Take 81 mg by mouth daily.    Historical Provider, MD  furosemide (LASIX) 20 MG tablet TAKE 1 TABLET(20 MG) BY MOUTH DAILY 07/02/16   Troy Sine, MD  HYDROcodone-acetaminophen (NORCO/VICODIN) 5-325 MG per tablet Take 1-2 tablets by mouth every 4 (four) hours as needed for moderate pain. 02/19/15   Donne Hazel, MD  Iron-Vitamins (GERITOL COMPLETE PO) Take 1 tablet by mouth daily.    Historical Provider, MD  isosorbide mononitrate (IMDUR) 30 MG 24 hr tablet TAKE 1 TABLET BY MOUTH EVERY DAY 07/16/16   Troy Sine, MD  LUMIGAN 0.01 % SOLN Place 1 drop into both eyes daily. 12/06/14   Historical Provider, MD  nitroGLYCERIN (NITROSTAT) 0.4 MG SL tablet Place 1 tablet (0.4 mg total) under the tongue every 5 (five) minutes as needed for chest pain.  02/26/15   Troy Sine, MD  oxyCODONE (ROXICODONE) 5 MG immediate release tablet Take 1 tablet (5 mg total) by mouth every 6 (six) hours as needed for breakthrough pain. 11/27/15   Esaw Grandchild, MD  polyethylene glycol [facility] Floria Raveling) packet Take 17 g by mouth daily. 03/12/16   Venetia Maxon Rama, MD  potassium chloride SA (K-DUR,KLOR-CON) 20 MEQ tablet Take 20 mEq by mouth daily.    Historical Provider, MD  pravastatin (PRAVACHOL) 40 MG tablet Take 1 tablet (40 mg total) by mouth daily. 10/26/16   Troy Sine, MD  RESTASIS 0.05 % ophthalmic emulsion Place 1 drop into both eyes daily. 12/30/14   Historical Provider, MD  spironolactone (ALDACTONE) 25 MG tablet TAKE 1/2 TABLET BY MOUTH TWICE DAILY, KEEP OFFICE VISIT 07/27/16   Troy Sine, MD  timolol (TIMOPTIC-XR) 0.5 % ophthalmic gel-forming Place 1 drop into both eyes daily. 12/10/14   Historical Provider, MD  traMADol (ULTRAM) 50 MG tablet Take 1 tablet (50 mg total) by mouth  every 6 (six) hours as needed. 02/26/16   Lajean Saver, MD   Family History Family History  Problem Relation Age of Onset  . Asthma Mother   . Other      Unsure if any heart disease in his family.   Social History Social History  Substance Use Topics  . Smoking status: Former Smoker    Years: 50.00    Types: Cigarettes    Quit date: 11/23/1999  . Smokeless tobacco: Never Used  . 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  Review of Systems Review of Systems  Eyes: Positive for visual disturbance.  Respiratory: Positive for shortness of breath (baseline).   Cardiovascular: Negative for chest pain.  Gastrointestinal: Negative for nausea and vomiting.  Musculoskeletal: Positive for gait problem and myalgias.  Skin: Positive for wound.  Neurological: Positive for dizziness and headaches. Negative for weakness and numbness.  All other systems reviewed and are negative.  Physical Exam Updated Vital Signs BP 127/97 (BP Location: Left Arm)   Pulse (!) 59   Temp 97.6 F (36.4 C) (Oral)   Resp 16   Ht 5\' 8"  (1.727 m)   Wt 169 lb 3.2 oz (76.7 kg)   SpO2 98%   BMI 25.73 kg/m   Physical Exam  Constitutional: He appears well-developed and well-nourished. No distress.  HENT:  Head: Normocephalic.  There is a 3cm scabbed laceration that is dry and clean with surrounding swelling and mild ecchymosis.   Eyes: Conjunctivae are normal.  Neck: Normal range of motion.  No c-spine tenderness.   Cardiovascular: Regular rhythm and normal heart sounds.  Bradycardia present.   Pulmonary/Chest: Effort normal and breath sounds normal. No respiratory distress. He has no wheezes. He has no rales.  Abdominal: He exhibits no distension.  Musculoskeletal: Normal range of motion.  Neurological: He is alert.  Normal sensation, strength is 5/5 in all four extremities. Normal gait.   Skin: No pallor.    Psychiatric: He has a normal mood and affect. His behavior is normal.  Nursing note and vitals reviewed.  ED Treatments / Results  DIAGNOSTIC STUDIES: Oxygen Saturation is 98% on RA, normal by my interpretation.   COORDINATION OF CARE: 12:33 PM-Discussed next steps with pt. Pt verbalized understanding and is agreeable with the plan.   Labs (all labs ordered are listed, but  only abnormal results are displayed) Labs Reviewed - No data to display  EKG  EKG Interpretation None      Radiology Ct Head Wo Contrast  Result Date: 11/17/2016 CLINICAL DATA:  Pt reports he was sitting in his chair last night and a large picture fell off the wall and struck him in the head. 2-3 in laceration noted to head. Bleeding controlled and scabbed over at this time. Pt denies any LOC. EXAM: CT HEAD WITHOUT CONTRAST TECHNIQUE: Contiguous axial images were obtained from the base of the skull through the vertex without intravenous contrast. COMPARISON:  05/12/2014 FINDINGS: Brain: No evidence of acute infarction, hemorrhage, hydrocephalus, extra-axial collection or mass lesion/mass effect. Vascular: No hyperdense vessel or unexpected calcification. Skull: Normal. Negative for fracture or focal lesion. Sinuses/Orbits: Visualize globes and orbits are unremarkable. Visualized sinuses and mastoid air cells are clear. Other: No scalp radiopaque foreign bodies.  No significant hematoma. IMPRESSION: 1. No acute intracranial abnormalities.  No skull fracture. Electronically Signed   By: Lajean Manes M.D.   On: 11/17/2016 13:26    Procedures Procedures   Medications Ordered in ED Medications - No data to display  Initial Impression / Assessment and Plan / ED Course  I have reviewed the triage vital signs and the nursing notes.  Pertinent labs & imaging results that were available during my care of the patient were reviewed by me and considered in my medical decision making (see chart for details).  Clinical  Course    Patient is a 75 year old male who currently takes aspirin presenting today with a head injury that occurred yesterday when a large picture fell off the wall on the top of his head. He denies any loss of consciousness but was dazed for a few minutes. This morning he noted some intermittent dizziness with walking and intermittent headache. He thinks his vision may be slightly more blurry but denies any unilateral leg pain or weakness. Patient has a wound to the top portion of his scalp that is currently clean dry and intact. No sutures are required. Neuro exam is within normal limits. Patient is able to ambulate here without signs of ataxia. He has no C-spine tenderness. Head CT pending. Patient also given a tetanus shot and some Tylenol.  Patient states he was totally normal yesterday and told the picture fell on his head. He did not have any syncope, palpitations, chest pain or new shortness of breath.  1:50 PM CT neg and pt d/ced home with concussion Final Clinical Impressions(s) / ED Diagnoses   Final diagnoses:  Injury of head, initial encounter  Concussion without loss of consciousness, initial encounter   New Prescriptions New Prescriptions   No medications on file   I personally performed the services described in this documentation, which was scribed in my presence.  The recorded information has been reviewed and considered.     Blanchie Dessert, MD 11/17/16 1350

## 2016-11-25 DIAGNOSIS — I10 Essential (primary) hypertension: Secondary | ICD-10-CM | POA: Diagnosis not present

## 2016-12-08 ENCOUNTER — Ambulatory Visit: Payer: Medicare Other | Admitting: Podiatry

## 2016-12-28 ENCOUNTER — Ambulatory Visit (INDEPENDENT_AMBULATORY_CARE_PROVIDER_SITE_OTHER): Payer: Medicare Other | Admitting: Podiatry

## 2016-12-28 DIAGNOSIS — M79676 Pain in unspecified toe(s): Secondary | ICD-10-CM | POA: Diagnosis not present

## 2016-12-28 DIAGNOSIS — B351 Tinea unguium: Secondary | ICD-10-CM

## 2016-12-28 NOTE — Progress Notes (Signed)
Patient ID: [name], male   DOB: 12-Oct-1941, 76 y.o.   MRN: [id number]    Subjective: This patient presents for a scheduled visit complaining of elongated and thickened toenails which are comfortable walking wearing shoes and request toenail debridement Patient also complains of right inferior heel pain in the last month. He has had evaluation in emergency department for right lower extremity pain in April 2017 with negative DVT and Rx of tramadol dated for pain control 4 7 2017  Objective: Orientated 3 No peripheral edema DP pulses 2/4 bilaterally PT pulses 0/4 right and 2/4 left Capillary reflex delayed bilaterally Sensation to 10 g monofilament wire intact 5/5 bilaterally Vibratory sensation nonreactive bilaterally Ankle reflexes weakly reactive bilaterally HAV bilaterally No open skin lesions bilaterally Well-healed surgical scar dorsal right foot The toenails are hypertrophic, elongated, discolored, deformed and tender direct palpation 6-10   Assessment: Symptomatic onychomycoses 6-10 Peripheral arterial disease evaluated by Dr. Gwenlyn Found   Plan: Debridement toenails 6-10 mechanically and electronically without any bleeding  Reappoint 3 months

## 2017-01-01 ENCOUNTER — Emergency Department (HOSPITAL_COMMUNITY)
Admission: EM | Admit: 2017-01-01 | Discharge: 2017-01-01 | Disposition: A | Discharge status: Home / Self Care | Payer: Medicare Other | Attending: Emergency Medicine | Admitting: Emergency Medicine

## 2017-01-01 ENCOUNTER — Encounter (HOSPITAL_COMMUNITY): Payer: Self-pay | Admitting: Emergency Medicine

## 2017-01-01 ENCOUNTER — Emergency Department (HOSPITAL_COMMUNITY): Payer: Medicare Other

## 2017-01-01 DIAGNOSIS — Z79899 Other long term (current) drug therapy: Secondary | ICD-10-CM | POA: Diagnosis not present

## 2017-01-01 DIAGNOSIS — R079 Chest pain, unspecified: Secondary | ICD-10-CM | POA: Diagnosis not present

## 2017-01-01 DIAGNOSIS — Z87891 Personal history of nicotine dependence: Secondary | ICD-10-CM | POA: Insufficient documentation

## 2017-01-01 DIAGNOSIS — I5022 Chronic systolic (congestive) heart failure: Secondary | ICD-10-CM | POA: Insufficient documentation

## 2017-01-01 DIAGNOSIS — Z7982 Long term (current) use of aspirin: Secondary | ICD-10-CM | POA: Insufficient documentation

## 2017-01-01 DIAGNOSIS — I252 Old myocardial infarction: Secondary | ICD-10-CM | POA: Insufficient documentation

## 2017-01-01 DIAGNOSIS — I2510 Atherosclerotic heart disease of native coronary artery without angina pectoris: Secondary | ICD-10-CM | POA: Diagnosis not present

## 2017-01-01 DIAGNOSIS — I130 Hypertensive heart and chronic kidney disease with heart failure and stage 1 through stage 4 chronic kidney disease, or unspecified chronic kidney disease: Secondary | ICD-10-CM | POA: Diagnosis not present

## 2017-01-01 DIAGNOSIS — R002 Palpitations: Secondary | ICD-10-CM | POA: Insufficient documentation

## 2017-01-01 DIAGNOSIS — N183 Chronic kidney disease, stage 3 (moderate): Secondary | ICD-10-CM | POA: Insufficient documentation

## 2017-01-01 LAB — BASIC METABOLIC PANEL
ANION GAP: 9 (ref 5–15)
BUN: 13 mg/dL (ref 6–20)
CALCIUM: 9.5 mg/dL (ref 8.9–10.3)
CHLORIDE: 107 mmol/L (ref 101–111)
CO2: 23 mmol/L (ref 22–32)
Creatinine, Ser: 1.74 mg/dL — ABNORMAL HIGH (ref 0.61–1.24)
GFR calc non Af Amer: 36 mL/min — ABNORMAL LOW (ref >60)
GFR, EST AFRICAN AMERICAN: 41 mL/min — AB (ref >60)
Glucose, Bld: 115 mg/dL — ABNORMAL HIGH (ref 65–99)
POTASSIUM: 3.7 mmol/L (ref 3.5–5.1)
Sodium: 139 mmol/L (ref 135–145)

## 2017-01-01 LAB — CBC
HCT: 41.7 % (ref 39.0–52.0)
HEMOGLOBIN: 14.3 g/dL (ref 13.0–17.0)
MCH: 30.4 pg (ref 26.0–34.0)
MCHC: 34.3 g/dL (ref 30.0–36.0)
MCV: 88.7 fL (ref 78.0–100.0)
Platelets: 185 10*3/uL (ref 150–400)
RBC: 4.7 MIL/uL (ref 4.22–5.81)
RDW: 12.4 % (ref 11.5–15.5)
WBC: 5.1 10*3/uL (ref 4.0–10.5)

## 2017-01-01 LAB — I-STAT TROPONIN, ED: TROPONIN I, POC: 0 ng/mL (ref 0.00–0.08)

## 2017-01-01 MED ORDER — ACETAMINOPHEN 325 MG PO TABS
650.0000 mg | ORAL_TABLET | Freq: Once | ORAL | Status: AC
  Administered 2017-01-01: 650 mg via ORAL
  Filled 2017-01-01: qty 2

## 2017-01-01 NOTE — ED Notes (Signed)
ED Provider at bedside. 

## 2017-01-01 NOTE — Discharge Instructions (Signed)
The test today, were reassuring. There has been no sign of heart attack, or blood problems, that might promote heart arrhythmias. The irregular heartbeat you're having, is "palpitation." There are many potential causes of this problem. Your heart doctor can help evaluate them. If your symptoms worsen or you have other concerning problems, return to the emergency department for another evaluation.  Your temperature was slightly elevated at 100.6. If you feel achy or have a fever, take Tylenol every 4 hours. Tylenol will also be helpful for muscle pain.

## 2017-01-01 NOTE — ED Triage Notes (Signed)
Pt c/o cold symptoms and palpitations x1 week. States hes had off and on chest pain and has taken some nitro. Denies CP at this time. Pt is in NAD, VSS.

## 2017-01-01 NOTE — ED Provider Notes (Signed)
[facility] DEPT Provider Note   CSN: [id number] Arrival date & time: 01/01/17  1112     History   Chief Complaint Chief Complaint  Patient presents with  . Palpitations  . URI    HPI [name] is a 76 y.o. male.  He presents for evaluation of a sensation of palpitations, including fast and slow heartbeat and skipping heartbeat. Onset of symptoms several days ago. He has also had some chest pain which comes and goes and may or may not be associated with the repetitions. He has taken nitroglycerin twice, for palpitations, but not for chest pain. He had a similar episode about 5 years ago, by his report. He is taking his medications as prescribed. He denies fever, chills, nausea, vomiting, cough, shortness of breath, weakness or dizziness. There's been no near syncope or syncope. He sees his cardiologist regularly, last visit was 3 months ago. There are no other known modifying factors.  HPI  Past Medical History:  Diagnosis Date  . CAD (coronary artery disease) 80% stenosis diag of the LAD, 30% in OM2 branch of LCX in 2009    a. Nonobstructive CAD by cath 11/2011 with the exception of the pre-existing diagonal branch #2 lesion.  . Chronic systolic CHF (congestive heart failure) ([place])   . CKD (chronic kidney disease) stage 3, GFR 30-59 ml/min   . Colon polyp, hyperplastic   . History of stress test 06/01/2012   Normal myocardial perfusion study. compared to the previous study there is no significant change. this is a low risk scan  . Hypertension   . Legally blind    "both eyes"  . Myocardial infarction 11/22/11  . NICM (nonischemic cardiomyopathy) ([place])    a. Remote hx of dilated NICM with EF ranging 20-45%, including normal EF by echo (55-60%) in 2014.  Marland Kitchen Peripheral arterial disease ([place])    a. 06/2014: ABI right 0.99, left 1.2, LE dopplers revealing an occluded right posterior tibial. As symptoms were not felt r/t claudication, no further w/u at the time.  Marland Kitchen PVC's  (premature ventricular contractions)   . 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 Requiring discontinuation of BB dose.  Marland Kitchen Spondylolisthesis   . Ventricular bigeminy   . 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 Unsteady gait 03/12/2016  . Nausea vomiting and diarrhea 03/11/2016  . UTI (lower urinary tract infection) 03/11/2016  . Gastroenteritis 03/11/2016  . Obstipation 03/11/2016  . Abdominal pain   . Constipation   . Lactic acidosis   . Leg pain   . Essential hypertension 03/04/2016  . 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 Chest pain with moderate risk for cardiac etiology 04/09/2015  . CAP (community acquired pneumonia) 02/18/2015  . Chest pain at rest 02/18/2015  . Peripheral arterial disease ([place]) 08/15/2014  . Hyperlipidemia with target LDL less than 70 01/01/2014  . GERD (gastroesophageal reflux disease) 01/01/2014  . First degree AV block 07/31/2012  . Unstable angina, negative MI 06/05/2012  . Acute on chronic systolic CHF (congestive heart failure) ([place]) 06/05/2012  . 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 CKD (chronic kidney disease) stage 3, GFR 30-59 ml/min 05/27/2012  . 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 Chronic systolic heart failure ([place]) 11/23/2011    Class: Diagnosis of  . CAD (coronary artery disease)  11/22/2011  . Premature ventricular contractions  11/22/2011  . Esophageal reflux 07/28/2011    Past Surgical History:  Procedure Laterality Date  . BACK SURGERY    . CARDIAC CATHETERIZATION  11/2011  . CARDIAC CATHETERIZATION  11/2011   didn't demonstrate high grade obstructive disease to account for his LV dysfunction.  Marland Kitchen CATARACT EXTRACTION, BILATERAL  1990's  . CYSTOSCOPY    . CYSTOSCOPY WITH URETHRAL DILATATION N/A 05/04/2013   Procedure: CYSTOSCOPY WITH URETHRAL  DILATATION;  Surgeon: Eustace Moore, MD;  Location: [facility];  Service: Neurosurgery;  Laterality: N/A;  with insertion of foley catheter  . EP study and ablation of VT  7/13   PVC focus mapped to the right coronary cusp of the aorta, limited ablation performed due to proximity of the focus to the right coronary artery  . EYE SURGERY    . LEFT HEART CATHETERIZATION WITH CORONARY ANGIOGRAM N/A 11/25/2011   Procedure: LEFT HEART CATHETERIZATION WITH CORONARY ANGIOGRAM;  Surgeon: Leonie Man, MD;  Location: [facility] C[facility];  Service: Cardiovascular;  Laterality: N/A;  . POSTERIOR FUSION [place]  . ROTATOR CUFF REPAIR  2000's   left  . V-TACH ABLATION N/A 06/06/2012   Procedure: V-TACH ABLATION;  Surgeon: Thompson Grayer, MD;  Location: [facility] CATH LAB;  Service: Cardiovascular;  Laterality: N/A;       Home Medications    Prior to Admission medications   Medication Sig Start Date End Date Taking? Authorizing Provider  amLODipine (NORVASC) 10 MG tablet Take 1 tablet (10 mg total) by mouth daily. 05/03/16   Troy Sine, MD  aspirin 81 MG tablet Take 81 mg by mouth daily.    Historical Provider, MD  furosemide (LASIX) 20 MG tablet TAKE 1 TABLET(20 MG) BY MOUTH DAILY 07/02/16   Troy Sine, MD  HYDROcodone-acetaminophen (NORCO/VICODIN) 5-325 MG per tablet Take 1-2 tablets by mouth every 4 (four) hours as needed for moderate pain. 02/19/15   Donne Hazel, MD  Iron-Vitamins (GERITOL COMPLETE PO) Take 1 tablet by mouth daily.    Historical Provider, MD  isosorbide mononitrate (IMDUR) 30 MG 24 hr tablet TAKE 1 TABLET BY MOUTH EVERY DAY 07/16/16   Troy Sine, MD  LUMIGAN 0.01 % SOLN Place 1 drop into both eyes daily. 12/06/14   Historical Provider, MD  nitroGLYCERIN (NITROSTAT) 0.4 MG SL tablet Place 1 tablet (0.4 mg total) under the tongue every 5 (five) minutes as needed for chest pain. 02/26/15   Troy Sine, MD  oxyCODONE (ROXICODONE) 5 MG immediate release tablet Take 1 tablet (5 mg  total) by mouth every 6 (six) hours as needed for breakthrough pain. 11/27/15   Esaw Grandchild, MD  polyethylene glycol [facility] / Floria Raveling) packet Take 17 g by mouth daily. 03/12/16   Venetia Maxon Rama, MD  potassium chloride SA (K-DUR,KLOR-CON) 20 MEQ tablet Take 20 mEq by mouth daily.    Historical Provider, MD  pravastatin (PRAVACHOL) 40 MG tablet Take 1 tablet (40 mg total) by mouth daily. 10/26/16   Troy Sine, MD  RESTASIS 0.05 % ophthalmic emulsion Place 1 drop into both eyes daily. 12/30/14   Historical Provider, MD  spironolactone (ALDACTONE) 25 MG tablet TAKE 1/2 TABLET BY MOUTH TWICE DAILY, KEEP OFFICE VISIT 07/27/16   Troy Sine, MD  timolol (TIMOPTIC-XR) 0.5 % ophthalmic gel-forming Place 1 drop into both eyes daily. 12/10/14   Historical Provider, MD  traMADol (ULTRAM) 50 MG tablet Take 1 tablet (50 mg total) by mouth every 6 (six) hours  as needed. 02/26/16   Lajean Saver, MD    Family History Family History  Problem Relation Age of Onset  . Asthma Mother   . Other      Unsure if any heart disease in his family.    Social History Social History  Substance Use Topics  . Smoking status: Former Smoker    Years: 50.00    Types: Cigarettes    Quit date: 11/23/1999  . Smokeless tobacco: Never Used  . 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   Review of Systems Review of Systems  All other systems reviewed and are negative.    Physical Exam Updated Vital Signs BP 136/64   Pulse 76   Temp 100.6 F (38.1 C) (Oral) Comment: EDP made aware.  Resp 19   SpO2 96%   Physical Exam  Constitutional: He is oriented to person, place, and time. He appears well-developed and well-nourished.  Non-toxic appearance. He does not appear ill.  HENT:  Head: Normocephalic and atraumatic.  Right Ear: External ear normal.  Left Ear: External ear normal.  Eyes: Conjunctivae and EOM are  normal. Pupils are equal, round, and reactive to light.  Neck: Normal range of motion and phonation normal. Neck supple.  Cardiovascular: Normal rate.   Murmur heard. Occasional irregularity. No significant pauses.  Pulmonary/Chest: Effort normal and breath sounds normal. No respiratory distress. He exhibits no bony tenderness.  Abdominal: Soft. There is no tenderness.  Musculoskeletal: Normal range of motion.  Neurological: He is alert and oriented to person, place, and time. No cranial nerve deficit or sensory deficit. He exhibits normal muscle tone. Coordination normal.  Skin: Skin is warm, dry and intact.  Psychiatric: He has a normal mood and affect. His behavior is normal. Judgment and thought content normal.  Nursing note and vitals reviewed.    ED Treatments / Results  Labs (all labs ordered are listed, but only abnormal results are displayed) Labs Reviewed  BASIC METABOLIC PANEL - Abnormal; Notable for the following:       Result Value   Glucose, Bld 115 (*)    Creatinine, Ser 1.74 (*)    GFR calc non Af Amer 36 (*)    GFR calc Af Amer 41 (*)    All other components within normal limits  CBC  I-STAT TROPOININ, ED    EKG  EKG Interpretation  Date/Time:  Saturday January 01 2017 11:40:43 EST Ventricular Rate:  72 PR Interval:  354 QRS Duration: 114 QT Interval:  378 QTC Calculation: 413 R Axis:   52 Text Interpretation:  Sinus rhythm with 1st degree A-V block Incomplete left bundle branch block ST & T wave abnormality, consider lateral ischemia Abnormal ECG since last tracing no significant change Confirmed by Eulis Foster  MD, Sahir Tolson [phone]) on 01/01/2017 1:23:04 PM       Radiology Dg Chest 2 View  Result Date: 01/01/2017 CLINICAL DATA:  Pt complains of intermittent centralized chest pain that radiates across chest x 1 week; Pt reports hx of MI 2013 and HTN; former smoker EXAM: CHEST - 2 VIEW COMPARISON:  11/27/2015 FINDINGS: Chronic coarse interstitial markings  without confluent airspace disease or overt edema. Tortuous thoracic aorta. Heart size and mediastinal contours are within normal limits. No effusion. Thoracolumbar fixation hardware, incompletely visualized. Postop changes in the left shoulder. IMPRESSION: Chronic interstitial changes without acute or  superimposed abnormality. Electronically Signed   By: Lucrezia Europe M.D.   On: 01/01/2017 12:38    Procedures Procedures (including critical care time)  Medications Ordered in ED Medications  acetaminophen (TYLENOL) tablet 650 mg (650 mg Oral Given 01/01/17 1505)     Initial Impression / Assessment and Plan / ED Course  I have reviewed the triage vital signs and the nursing notes.  Pertinent labs & imaging results that were available during my care of the patient were reviewed by me and considered in my medical decision making (see chart for details).  Clinical Course as of Jan 01 1509  Sat Jan 01, 2017  1325 NAD DG Chest 2 View [EW]  1325 Possible variable Atrial Flutter ED EKG within 10 minutes [EW]    Clinical Course User Index [EW] Daleen Bo, MD    Medications  acetaminophen (TYLENOL) tablet 650 mg (650 mg Oral Given 01/01/17 1505)    Patient Vitals for the past 24 hrs:  BP Temp Temp src Pulse Resp SpO2  01/01/17 1504 - 100.6 F (38.1 C) Oral - - -  01/01/17 1500 136/64 - - 76 19 96 %  01/01/17 1445 146/77 - - 72 18 97 %  01/01/17 1430 143/66 - - 66 20 96 %  01/01/17 1400 141/65 - - 70 20 99 %  01/01/17 1139 135/66 99.1 F (37.3 C) Oral 73 16 100 %    3:03 PM Reevaluation with update and discussion. After initial assessment and treatment, an updated evaluation reveals He has had some intermittent sharp pain in his chest which radiates to his head. He also complains of pain in his left posterior shoulder region, which he feels was aggravated by lifting a box yesterday. At this time. He has mild tenderness in the left trapezius. Headache. Monitor is unchanged from earlier. He  continues to show normal sinus rhythm with prolonged PR, and occasional mild irregularity. This most likely sinus arrhythmia. There is no consistent evidence or suggestion for atrial flutter. Findings discussed with patient and wife, all questions were answered. Jiyan Walkowski L    Final Clinical Impressions(s) / ED Diagnoses   Final diagnoses:  Palpitations   Palpitations, cause not clear. No evidence for atrial fibrillation, atrial flutter, malignant arrhythmias during the period of observation on the cardiac monitor in the emergency department. Has not had any syncope. He has PE or pneumonia.  Discharge vitals indicated that the patient's temperature was elevated at 100.6. Patient does not have any signs or serious bacterial infection. He was instructed to use Tylenol when necessary, and return if needed.  Nursing Notes Reviewed/ Care Coordinated Applicable Imaging Reviewed Interpretation of Laboratory Data incorporated into ED treatment  The patient appears reasonably screened and/or stabilized for discharge and I doubt any other medical condition or other [facility] requiring further screening, evaluation, or treatment in the ED at this time prior to discharge.  Plan: Home Medications- continue; Home Treatments- rest, fluids; return here if the recommended treatment, does not improve the symptoms; Recommended follow up- contact cardiology in 2 days time to arrange for outpatient Holter monitoring versus evaluation and treatment, as they recommend.    New Prescriptions New Prescriptions   No medications on file     Daleen Bo, MD 01/01/17 1511

## 2017-01-01 NOTE — ED Notes (Signed)
Patient ambulated in hallway Without help. Sats 95 to 99% . No SOB noted. HR 95 to 100

## 2017-01-06 DIAGNOSIS — I10 Essential (primary) hypertension: Secondary | ICD-10-CM | POA: Diagnosis not present

## 2017-01-06 DIAGNOSIS — E785 Hyperlipidemia, unspecified: Secondary | ICD-10-CM | POA: Diagnosis not present

## 2017-01-06 DIAGNOSIS — H42 Glaucoma in diseases classified elsewhere: Secondary | ICD-10-CM | POA: Diagnosis not present

## 2017-01-06 DIAGNOSIS — J4520 Mild intermittent asthma, uncomplicated: Secondary | ICD-10-CM | POA: Diagnosis not present

## 2017-01-07 ENCOUNTER — Encounter: Payer: Self-pay | Admitting: Cardiovascular Disease

## 2017-01-07 ENCOUNTER — Ambulatory Visit (INDEPENDENT_AMBULATORY_CARE_PROVIDER_SITE_OTHER): Payer: Medicare Other | Admitting: Cardiovascular Disease

## 2017-01-07 VITALS — BP 125/65 | HR 54 | Ht 68.0 in | Wt 171.4 lb

## 2017-01-07 DIAGNOSIS — E785 Hyperlipidemia, unspecified: Secondary | ICD-10-CM

## 2017-01-07 DIAGNOSIS — I2583 Coronary atherosclerosis due to lipid rich plaque: Secondary | ICD-10-CM

## 2017-01-07 DIAGNOSIS — N183 Chronic kidney disease, stage 3 unspecified: Secondary | ICD-10-CM

## 2017-01-07 DIAGNOSIS — I10 Essential (primary) hypertension: Secondary | ICD-10-CM

## 2017-01-07 DIAGNOSIS — Z79899 Other long term (current) drug therapy: Secondary | ICD-10-CM

## 2017-01-07 DIAGNOSIS — I2510 Atherosclerotic heart disease of native coronary artery without angina pectoris: Secondary | ICD-10-CM

## 2017-01-07 MED ORDER — SPIRONOLACTONE 25 MG PO TABS: 12.5000 mg | ORAL_TABLET | Freq: Every day | ORAL | 10 refills | Status: DC

## 2017-01-07 MED ORDER — FUROSEMIDE 20 MG PO TABS: ORAL_TABLET | ORAL | 1 refills | Status: DC

## 2017-01-07 NOTE — Progress Notes (Signed)
Patient ID: [name], male   DOB: 03-Feb-1941, 76 y.o.   MRN: [id number]      Primary M.D.: Dr. Darlyne Russian  HPI: [name] is a 76 y.o. male who presents for a 7 month followup cardiology evaluation.  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 In  December 2012 he presented with acute congestive heart failure requiring BiPAP therapy and during his hospitalization ejection fraction dropped to 20-25%. He ruled in for non-ST segment elevation MI and was not found to have high-grade obstructive CAD. He did wear life-vest transiently and with improvement of his ejection fraction to approximately 35-40% in April 2013 his life as was discontinued. He also has a history of intermittent second-degree AV block lead leading to discontinuance of his beta blocker therapy and reduction of his amiodarone dose. An echo Doppler study in March 2014 revealed an ejection fraction that had increased to approximately 55%. 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  He was hospitalized in May 2016 with chest pain.  His cardiac enzymes were negative for MI.  He had an exercise Myoview study on 04/10/2015 and on the stress portion of the study he had episodes of nonsustained VT.  He did not have chest pain or diagnostic ECG changes and the result was felt to be low risk. During that hospitalization a follow-up echo Doppler study showed an ejection fraction that had improved to 45-50% with inferior hypokinesis.  There was mild LVH and grade 1 diastolic dysfunction.  During his hospitalization he did have some bradycardia.  Since last saw him, he continues to feel well.  He was evaluated in th ERon 01/01/17 for some palpitaions and was felt to have an URI. He was febrile to 100.6.  Laboratory showed a Cr 1.74 which had increased from 1.37 10 months ago. He denies any chest pain.  He denies PND or orthopnea.  He is unaware of any recurrent palpitations.  He denies  significant edema.  He presents for reevaluation.  Past Medical History:  Diagnosis Date  . CAD (coronary artery disease) 80% stenosis diag of the LAD, 30% in OM2 branch of LCX in 2009    a. Nonobstructive CAD by cath 11/2011 with the exception of the pre-existing diagonal branch #2 lesion.  . Chronic systolic CHF (congestive heart failure) ([place])   . CKD (chronic kidney disease) stage 3, GFR 30-59 ml/min   . Colon polyp, hyperplastic   . History of stress test 06/01/2012   Normal myocardial perfusion study. compared to the previous study there is no significant change. this is a low risk scan  . Hypertension   . Legally blind    "both eyes"  . Myocardial infarction 11/22/11  . NICM (nonischemic cardiomyopathy) ([place])    a. Remote hx of dilated NICM with EF ranging 20-45%, including normal EF by echo (55-60%) in 2014.  [name] Peripheral arterial disease ([place])    a. 06/2014: ABI right 0.99, left 1.2, LE dopplers revealing an occluded right posterior tibial. As symptoms were not felt r/t claudication, no further w/u at the time.  [name] PVC's (premature ventricular contractions)   . 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 Requiring discontinuation of BB dose.  [name] Spondylolisthesis   . Ventricular bigeminy   . Ventricular tachycardia (paroxysmal) ([place]) 04/11/2015    Past Surgical History:  Procedure Laterality Date  . BACK SURGERY    . CARDIAC CATHETERIZATION  11/2011  .  CARDIAC CATHETERIZATION  11/2011   didn't demonstrate high grade obstructive disease to account for his LV dysfunction.  [name] CATARACT EXTRACTION, BILATERAL  1990's  . CYSTOSCOPY    . CYSTOSCOPY WITH URETHRAL DILATATION N/A 05/04/2013   Procedure: CYSTOSCOPY WITH URETHRAL DILATATION;  Surgeon: Rodney Moore, MD;  Location: [facility];  Service: Neurosurgery;  Laterality: N/A;  with insertion of foley catheter  . EP study and ablation of VT  7/13   PVC focus mapped to the right coronary cusp of the aorta, limited ablation  performed due to proximity of the focus to the right coronary artery  . EYE SURGERY    . LEFT HEART CATHETERIZATION WITH CORONARY ANGIOGRAM N/A 11/25/2011   Procedure: LEFT HEART CATHETERIZATION WITH CORONARY ANGIOGRAM;  Surgeon: Rodney Man, MD;  Location: [facility] CATH LAB;  Service: Cardiovascular;  Laterality: N/A;  . POSTERIOR FUSION [place]  . ROTATOR CUFF REPAIR  2000's   left  . V-TACH ABLATION N/A 06/06/2012   Procedure: V-TACH ABLATION;  Surgeon: Rodney Grayer, MD;  Location: [facility] CATH LAB;  Service: Cardiovascular;  Laterality: N/A;    No Known Allergies  Current Outpatient Prescriptions  Medication Sig Dispense Refill  . amLODipine (NORVASC) 10 MG tablet Take 1 tablet (10 mg total) by mouth daily. 30 tablet 11  . aspirin 81 MG tablet Take 81 mg by mouth daily.    . fluticasone furoate-vilanterol (BREO ELLIPTA) 100-25 MCG/INH AEPB Inhale 1 puff into the lungs daily. (For 14 days)    . furosemide (LASIX) 20 MG tablet Take 1 tablet every other day 90 tablet 1  . isosorbide mononitrate (IMDUR) 30 MG 24 hr tablet TAKE 1 TABLET BY MOUTH EVERY DAY 30 tablet 10  . LUMIGAN 0.01 % SOLN Place 1 drop into both eyes daily.  0  . nitroGLYCERIN (NITROSTAT) 0.4 MG SL tablet Place 1 tablet (0.4 mg total) under the tongue every 5 (five) minutes as needed for chest pain. 25 tablet 3  . pravastatin (PRAVACHOL) 40 MG tablet Take 1 tablet (40 mg total) by mouth daily. 30 tablet 7  . RESTASIS 0.05 % ophthalmic emulsion Place 1 drop into both eyes daily.  11  . spironolactone (ALDACTONE) 25 MG tablet Take 0.5 tablets (12.5 mg total) by mouth daily. 30 tablet 10  . timolol (TIMOPTIC-XR) 0.5 % ophthalmic gel-forming Place 1 drop into both eyes daily.  5  . potassium chloride SA (K-DUR,KLOR-CON) 20 MEQ tablet Take 20 mEq by mouth daily.     No current facility-administered medications for this visit.     Social History   Social History  . Marital status: Legally Separated    Spouse name: N/A    . Number of children: 3  . Years of education: N/A   Occupational History  . Retired Retired   Social History Main Topics  . Smoking status: Former Smoker    Years: 50.00    Types: Cigarettes    Quit date: 11/23/1999  . Smokeless tobacco: Never Used  . 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 Drug use: No  . Sexual activity: Yes   Other Topics Concern  . Not on file   Social History Narrative  . No narrative on file    Socially, he is divorced and has 4 children, and 7 grandchildren.  ROS General: Negative; No fevers, chills, or night sweats;  HEENT: Negative; No changes in  vision or hearing, sinus congestion, difficulty swallowing Pulmonary: Negative; No cough, wheezing, shortness of breath, hemoptysis Cardiovascular:  See history of present illness GI: He has a history of GERD, which is controlled with pantoprazole No nausea, vomiting, diarrhea, or abdominal pain GU: Negative; No dysuria, hematuria, or difficulty voiding Musculoskeletal: Negative; no myalgias, joint pain, or weakness Hematologic/Oncology: Negative; no easy bruising, bleeding Endocrine: Negative; no heat/cold intolerance; no diabetes Neuro: Negative; no changes in balance, headaches Skin: Negative; No rashes or skin lesions Psychiatric: Negative; No behavioral problems, depression Sleep: Negative; No snoring, daytime sleepiness, hypersomnolence, bruxism, restless legs, hypnogognic hallucinations, no cataplexy Other comprehensive 14 point system review is negative.   PE BP 125/65 (BP Location: Right Arm)   Pulse (!) 54   Ht '5\' 8"'  (1.727 m)   Wt 171 lb 6.4 oz (77.7 kg)   BMI 26.06 kg/m     Wt Readings from Last 3 Encounters:  01/07/17 171 lb 6.4 oz (77.7 kg)  11/17/16 169 lb 3.2 oz (76.7 kg)  06/14/16 174 lb (78.9 kg)   General: Alert, oriented, no distress.  Skin: normal turgor, no rashes HEENT: Normocephalic, atraumatic. Pupils round  and reactive; sclera anicteric;no lid lag.  Nose without nasal septal hypertrophy Mouth/Parynx benign; Mallinpatti scale 3 Chest wall: Nontender to palpation Neck: No JVD, no carotid bruits with normal carotid upstroke. Lungs: clear to ausculatation and percussion; no wheezing or rales Heart: RRR, s1 s2 normal 1/6 systolic murmur. No S3 or S4 gallop; no diastolic murmur. No rubs thrills or heaves Abdomen: soft, nontender; no hepatosplenomehaly, BS+; abdominal aorta nontender and not dilated by palpation. No CVA tenderness Pulses 2+ Extremities: no clubbing cyanosis or edema, Homan's sign negative  Neurologic: grossly nonfocal Psychological: Normal affect and mood  ECG (independently read by me): Probable low atrial rhythm with ventricular rate of 54 bpm.  First-degree AV block with a PR interval 332 ms.  Nonspecific ST-T changes.  July 2017 ECG (independently read by me): Sinus rhythm.  She 1 bpm without ectopy.  First-degree AV block.  Nonspecific ST-T changes.  January 2017 ECG (independently read by me):  Normal sinus rhythm at 62 bpm. First-degree AV block. Isolated PVC.  August 2015 ECG (independently read by me): Sinus rhythm at 61 beats per minute with a mild sinus arrhythmia and first-degree AV block.  Nonspecific ST change  Prior 01/01/2014 ECG (independently read by me): Sinus bradycardia with marked first-degree AV block with PR interval at 348 ms. QTc interval 457 ms. No significant ST-T changes.  Prior ECG from 06/27/2013: Normal sinus rhythm at 64 beats per minute with first-degree AV block. Voltaren 6 ms. QTC interval 464 ms.  LABS: BMP Latest Ref Rng & Units 01/01/2017 03/12/2016 03/11/2016  Glucose 65 - 99 mg/dL 115(H) 115(H) 126(H)  BUN 6 - 20 mg/dL '13 15 18  ' Creatinine 0.61 - 1.24 mg/dL 1.74(H) 1.37(H) 1.53(H)  Sodium 135 - 145 mmol/L 139 140 142  Potassium 3.5 - 5.1 mmol/L 3.7 4.2 4.4  Chloride 101 - 111 mmol/L 107 109 107  CO2 22 - 32 mmol/L '23 22 22  ' Calcium 8.9  - 10.3 mg/dL 9.5 8.8(L) 8.9   Hepatic Function Latest Ref Rng & Units 03/11/2016 03/10/2016 12/04/2015  Total Protein 6.5 - 8.1 g/dL 7.6 8.4(H) 7.2  Albumin 3.5 - 5.0 g/dL 3.6 4.2 4.0  AST 15 - 41 U/L 52(H) 30 20  ALT 17 - 63 U/L 44 22 18  Alk Phosphatase 38 - 126 U/L 60 67 61  Total Bilirubin  0.3 - 1.2 mg/dL 1.8(H) 1.2 0.6  Bilirubin, Direct 0.0 - 0.3 mg/dL - - -   CBC Latest Ref Rng & Units 01/01/2017 03/11/2016 03/10/2016  WBC 4.0 - 10.5 K/uL 5.1 13.6(H) 10.3  Hemoglobin 13.0 - 17.0 g/dL 14.3 14.3 15.6  Hematocrit 39.0 - 52.0 % 41.7 42.6 44.6  Platelets 150 - 400 K/uL 185 163 163   Lab Results  Component Value Date   MCV 88.7 01/01/2017   MCV 87.8 03/11/2016   MCV 86.9 03/10/2016   Lab Results  Component Value Date   TSH 0.722 03/11/2016   Lab Results  Component Value Date   HGBA1C 5.9 (H) 05/26/2012   Lipid Panel     Component Value Date/Time   CHOL 83 (L) 12/04/2015 1102   TRIG 56 12/04/2015 1102   HDL 42 12/04/2015 1102   CHOLHDL 2.0 12/04/2015 1102   VLDL 11 12/04/2015 1102   LDLCALC 30 12/04/2015 1102     RADIOLOGY: No results found.  IMPRESSION:  1. Hyperlipidemia with target LDL less than 70   2. Essential hypertension   3. Coronary artery disease due to lipid rich plaque   4. CKD (chronic kidney disease) stage 3, GFR 30-59 ml/min   5. Drug therapy     ASSESSMENT AND PLAN: Mr. Dray Is a 76 year old African-American male who has a history of a nonischemic cardiomyopathy with an ejection fraction in the past at 25%. His LV function has normalized on current medical regimen and on his  echo Doppler assessment of May 2016 was 45 - 50%. Presently, he is not having any signs of CHF or volume overload. His blood pressure today is well controlled on amlodipine 10 mg, furosemide 20 mg,  spironolactone 12.5 twice a day. He is not having any chest pain symptoms on his current dose of 30 mg  isosorbide mononitrate. He does have significant first-degree AV block  which has been present previously.   Remotely, his amiodarone was reduced and discontinued due to bradycardia and his heart block.  He was recently seen in the ER and was febrile. Cr had increaed to 1.74. At present there are no signs of volume overload.  I am reducing spironolactone to 12.5  Daily and as long as there is no edema he can decrease lasix to 20 mg every other day. In 2 weeks I will re-check CMET, TSH, CBC, Lipid studies and will contact him with results and if changes are needed to his medical regimen. I will see him in 3 months for f/u evaluation.  Time spent: 25 minutes  Troy Sine, MD, [facility]  01/08/2017 5:31 PM

## 2017-01-07 NOTE — Patient Instructions (Signed)
Your physician recommends that you return for lab work in: 2 weeks.  Your physician has recommended you make the following change in your medication:   1.) the furosemide has been changed to 1 TABLET EVERY OTHER DAY.  2.) The spironolactone has been decreased to  1/2 TABLET DAILY.  Your physician recommends that you schedule a follow-up appointment in: MAY 2018.

## 2017-01-21 DIAGNOSIS — J4520 Mild intermittent asthma, uncomplicated: Secondary | ICD-10-CM | POA: Diagnosis not present

## 2017-01-24 DIAGNOSIS — I2583 Coronary atherosclerosis due to lipid rich plaque: Secondary | ICD-10-CM | POA: Diagnosis not present

## 2017-01-24 DIAGNOSIS — E785 Hyperlipidemia, unspecified: Secondary | ICD-10-CM | POA: Diagnosis not present

## 2017-01-24 DIAGNOSIS — I10 Essential (primary) hypertension: Secondary | ICD-10-CM | POA: Diagnosis not present

## 2017-01-24 DIAGNOSIS — Z79899 Other long term (current) drug therapy: Secondary | ICD-10-CM | POA: Diagnosis not present

## 2017-01-24 DIAGNOSIS — I2510 Atherosclerotic heart disease of native coronary artery without angina pectoris: Secondary | ICD-10-CM | POA: Diagnosis not present

## 2017-01-24 LAB — CBC
HEMATOCRIT: 42.4 % (ref 38.5–50.0)
Hemoglobin: 14 g/dL (ref 13.2–17.1)
MCH: 29.6 pg (ref 27.0–33.0)
MCHC: 33 g/dL (ref 32.0–36.0)
MCV: 89.6 fL (ref 80.0–100.0)
MPV: 10.4 fL (ref 7.5–12.5)
Platelets: 172 10*3/uL (ref 140–400)
RBC: 4.73 MIL/uL (ref 4.20–5.80)
RDW: 13.3 % (ref 11.0–15.0)
WBC: 3.9 10*3/uL (ref 3.8–10.8)

## 2017-01-24 LAB — COMPREHENSIVE METABOLIC PANEL
ALBUMIN: 3.9 g/dL (ref 3.6–5.1)
ALK PHOS: 74 U/L (ref 40–115)
ALT: 10 U/L (ref 9–46)
AST: 15 U/L (ref 10–35)
BUN: 12 mg/dL (ref 7–25)
CALCIUM: 9.5 mg/dL (ref 8.6–10.3)
CHLORIDE: 106 mmol/L (ref 98–110)
CO2: 27 mmol/L (ref 20–31)
Creat: 1.37 mg/dL — ABNORMAL HIGH (ref 0.70–1.18)
Glucose, Bld: 95 mg/dL (ref 65–99)
Potassium: 4.2 mmol/L (ref 3.5–5.3)
Sodium: 140 mmol/L (ref 135–146)
TOTAL PROTEIN: 7.3 g/dL (ref 6.1–8.1)
Total Bilirubin: 0.8 mg/dL (ref 0.2–1.2)

## 2017-01-24 LAB — LIPID PANEL
Cholesterol: 111 mg/dL (ref <200)
HDL: 50 mg/dL (ref >40)
LDL CALC: 53 mg/dL (ref <100)
Total CHOL/HDL Ratio: 2.2 Ratio (ref <5.0)
Triglycerides: 42 mg/dL (ref <150)
VLDL: 8 mg/dL (ref <30)

## 2017-01-24 LAB — TSH: TSH: 1.75 m[IU]/L (ref 0.40–4.50)

## 2017-01-25 DIAGNOSIS — H04123 Dry eye syndrome of bilateral lacrimal glands: Secondary | ICD-10-CM | POA: Diagnosis not present

## 2017-01-25 DIAGNOSIS — H401133 Primary open-angle glaucoma, bilateral, severe stage: Secondary | ICD-10-CM | POA: Diagnosis not present

## 2017-02-10 ENCOUNTER — Other Ambulatory Visit: Payer: Self-pay | Admitting: Cardiovascular Disease

## 2017-02-11 NOTE — Telephone Encounter (Signed)
REFILL 

## 2017-03-08 DIAGNOSIS — Z0000 Encounter for general adult medical examination without abnormal findings: Secondary | ICD-10-CM | POA: Diagnosis not present

## 2017-03-08 DIAGNOSIS — H42 Glaucoma in diseases classified elsewhere: Secondary | ICD-10-CM | POA: Diagnosis not present

## 2017-03-08 DIAGNOSIS — N189 Chronic kidney disease, unspecified: Secondary | ICD-10-CM | POA: Diagnosis not present

## 2017-03-08 DIAGNOSIS — E785 Hyperlipidemia, unspecified: Secondary | ICD-10-CM | POA: Diagnosis not present

## 2017-03-08 DIAGNOSIS — I10 Essential (primary) hypertension: Secondary | ICD-10-CM | POA: Diagnosis not present

## 2017-03-29 ENCOUNTER — Encounter: Payer: Self-pay | Admitting: Podiatry

## 2017-03-29 ENCOUNTER — Telehealth: Payer: Self-pay | Admitting: Cardiovascular Disease

## 2017-03-29 ENCOUNTER — Ambulatory Visit (INDEPENDENT_AMBULATORY_CARE_PROVIDER_SITE_OTHER): Payer: Medicare Other | Admitting: Podiatry

## 2017-03-29 DIAGNOSIS — I739 Peripheral vascular disease, unspecified: Secondary | ICD-10-CM | POA: Diagnosis not present

## 2017-03-29 DIAGNOSIS — B351 Tinea unguium: Secondary | ICD-10-CM | POA: Diagnosis not present

## 2017-03-29 DIAGNOSIS — M79676 Pain in unspecified toe(s): Secondary | ICD-10-CM

## 2017-03-29 NOTE — Progress Notes (Signed)
Patient ID: [name], male   DOB: 12/12/1940, 76 y.o.   MRN: [id number]    Subjective: This patient presents for a scheduled visit complaining of elongated and thickened toenails which are comfortable walking wearing shoes and request toenail debridement Patient also complains of right inferior heel pain in the last month. He has had evaluation in emergency department for right lower extremity pain in April 2017 with negative DVT and Rx of tramadol dated for pain control 4 7 2017  Objective: Orientated 3 No peripheral edema DP pulses 2/4 bilaterally PT pulses 0/4 right and 2/4 left Capillary reflex delayed bilaterally Sensation to 10 g monofilament wire intact 5/5 bilaterally Vibratory sensation nonreactive bilaterally Ankle reflexes weakly reactive bilaterally HAV bilaterally No open skin lesions bilaterally Well-healed surgical scar dorsal right foot The toenails are hypertrophic, elongated, discolored, deformed and tender direct palpation 6-10 Atrophic skin with absent hair growth bilaterally  Assessment: Symptomatic onychomycoses 6-10 Peripheral arterial disease evaluated by Dr. Gwenlyn Found   Plan: Debridement toenails 6-10 mechanically and electronically without any bleeding  Reappoint 3 months

## 2017-03-29 NOTE — Telephone Encounter (Signed)
Follow up    [facility] returning call back.   Please have the latest K+ level.

## 2017-03-29 NOTE — Telephone Encounter (Signed)
Lm2cb 5-8

## 2017-03-29 NOTE — Telephone Encounter (Signed)
LM for Merrilee Seashore, RN with [facility] that patient has h/o chronic systolic CHF & last EF per 2016 echo, BP/HR per last office visit 12/2016

## 2017-03-29 NOTE — Telephone Encounter (Signed)
New Message  Merrilee Seashore from [facility] call to speak with RN about diagnoses of hear failure for heart health program. Please call back to discuss

## 2017-04-04 ENCOUNTER — Ambulatory Visit (INDEPENDENT_AMBULATORY_CARE_PROVIDER_SITE_OTHER): Payer: Medicare Other | Admitting: Cardiovascular Disease

## 2017-04-04 ENCOUNTER — Encounter: Payer: Self-pay | Admitting: Cardiovascular Disease

## 2017-04-04 VITALS — BP 130/70 | HR 54 | Ht 68.0 in | Wt 169.0 lb

## 2017-04-04 DIAGNOSIS — I440 Atrioventricular block, first degree: Secondary | ICD-10-CM | POA: Diagnosis not present

## 2017-04-04 DIAGNOSIS — Z8679 Personal history of other diseases of the circulatory system: Secondary | ICD-10-CM | POA: Diagnosis not present

## 2017-04-04 DIAGNOSIS — N183 Chronic kidney disease, stage 3 unspecified: Secondary | ICD-10-CM

## 2017-04-04 DIAGNOSIS — I10 Essential (primary) hypertension: Secondary | ICD-10-CM

## 2017-04-04 NOTE — Patient Instructions (Signed)
Medication Instructions: No changes   Follow-Up: Your physician wants you to follow-up in: One year with Dr. Claiborne Billings. You will receive a reminder letter in the mail two months in advance. If you don't receive a letter, please call our office to schedule the follow-up appointment.   If you need a refill on your cardiac medications before your next appointment, please call your pharmacy.

## 2017-04-04 NOTE — Progress Notes (Signed)
Patient ID: [name], male   DOB: Feb 16, 1941, 76 y.o.   MRN: [id number]      Primary M.D.: Dr. Darlyne Russian  HPI: [name] is a 76 y.o. male who presents for a 3 month followup cardiology evaluation.  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 In  December 2012 he presented with acute congestive heart failure requiring BiPAP therapy and during his hospitalization ejection fraction dropped to 20-25%. He ruled in for non-ST segment elevation MI and was not found to have high-grade obstructive CAD. He did wear life-vest transiently and with improvement of his ejection fraction to approximately 35-40% in April 2013 his life as was discontinued. He also has a history of intermittent second-degree AV block lead leading to discontinuance of his beta blocker therapy and reduction of his amiodarone dose. An echo Doppler study in March 2014 revealed an ejection fraction that had increased to approximately 55%. 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  He was hospitalized in May 2016 with chest pain.  His cardiac enzymes were negative for MI.  He had an exercise Myoview study on 04/10/2015 and on the stress portion of the study he had episodes of nonsustained VT.  He did not have chest pain or diagnostic ECG changes and the result was felt to be low risk. During that hospitalization a follow-up echo Doppler study showed an ejection fraction that had improved to 45-50% with inferior hypokinesis.  There was mild LVH and grade 1 diastolic dysfunction.  During his hospitalization he did have some bradycardia.  He was evaluated in th ER on 01/01/17 for palpitaions and was felt to have an URI. He was febrile to 100.6.  Laboratory showed a Cr 1.74 which had increased from 1.37 10 months ago. He denies any chest pain.  He denies PND or orthopnea.  He is unaware of any recurrent palpitations.  He denies significant edema.    Since I last saw him, he  has felt well.  He has noticed very rare twinges of atypical chest pain which is short-lived and nonexertional and resolve spontaneously.  He has been taking spironolactone 12.5 mg daily, furosemide 20 mg every other day, amlodipine 10 mg in addition to isosorbide 30 mg.  He denies any anginal type symptomatology.  His has not had any significant edema on this regimen.  Laboratory showed improvement in his creatinine at 1.37 from 1.74 in [place] 2018.  Lipid studies were excellent, not on therapy with a total cholesterol 111, triglycerides 42, HDL 50, and LDL 53.  He sees Dr. Criss Rosales at three-month intervals.  He presents for follow-up cardiology evaluation.    Past Medical History:  Diagnosis Date  . CAD (coronary artery disease) 80% stenosis diag of the LAD, 30% in OM2 branch of LCX in 2009    a. Nonobstructive CAD by cath 11/2011 with the exception of the pre-existing diagonal branch #2 lesion.  . Chronic systolic CHF (congestive heart failure) ([place])   . CKD (chronic kidney disease) stage 3, GFR 30-59 ml/min   . Colon polyp, hyperplastic   . History of stress test 06/01/2012   Normal myocardial perfusion study. compared to the previous study there is no significant change. this is a low risk scan  . Hypertension   . Legally blind    "both eyes"  . Myocardial infarction ([place]) 11/22/11  . NICM (nonischemic cardiomyopathy) ([place])    a. Remote hx of dilated NICM with  EF ranging 20-45%, including normal EF by echo (55-60%) in 2014.  Marland Kitchen Peripheral arterial disease ([place])    a. 06/2014: ABI right 0.99, left 1.2, LE dopplers revealing an occluded right posterior tibial. As symptoms were not felt r/t claudication, no further w/u at the time.  Marland Kitchen PVC's (premature ventricular contractions)   . 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 Requiring discontinuation of BB dose.  Marland Kitchen Spondylolisthesis   . Ventricular bigeminy   . Ventricular tachycardia (paroxysmal) ([place]) 04/11/2015    Past Surgical History:    Procedure Laterality Date  . BACK SURGERY    . CARDIAC CATHETERIZATION  11/2011  . CARDIAC CATHETERIZATION  11/2011   didn't demonstrate high grade obstructive disease to account for his LV dysfunction.  Marland Kitchen CATARACT EXTRACTION, BILATERAL  1990's  . CYSTOSCOPY    . CYSTOSCOPY WITH URETHRAL DILATATION N/A 05/04/2013   Procedure: CYSTOSCOPY WITH URETHRAL DILATATION;  Surgeon: Eustace Moore, MD;  Location: [facility];  Service: Neurosurgery;  Laterality: N/A;  with insertion of foley catheter  . EP study and ablation of VT  7/13   PVC focus mapped to the right coronary cusp of the aorta, limited ablation performed due to proximity of the focus to the right coronary artery  . EYE SURGERY    . LEFT HEART CATHETERIZATION WITH CORONARY ANGIOGRAM N/A 11/25/2011   Procedure: LEFT HEART CATHETERIZATION WITH CORONARY ANGIOGRAM;  Surgeon: Leonie Man, MD;  Location[facility] CATH LAB;  Service: Cardiovascular;  Laterality: N/A;  . POSTERIOR FUSION [place]  . ROTATOR CUFF REPAIR  2000's   left  . V-TACH ABLATION N/A 06/06/2012   Procedure: V-TACH ABLATION;  Surgeon: Thompson Grayer, MD;  Location: [facility] CATH LAB;  Service: Cardiovascular;  Laterality: N/A;    No Known Allergies  Current Outpatient Prescriptions  Medication Sig Dispense Refill  . amLODipine (NORVASC) 10 MG tablet Take 1 tablet (10 mg total) by mouth daily. 30 tablet 11  . aspirin 81 MG tablet Take 81 mg by mouth daily.    . fluticasone furoate-vilanterol (BREO ELLIPTA) 100-25 MCG/INH AEPB Inhale 1 puff into the lungs daily. (For 14 days)    . furosemide (LASIX) 20 MG tablet TAKE 1 TABLET(20 MG) BY MOUTH DAILY 30 tablet 2  . isosorbide mononitrate (IMDUR) 30 MG 24 hr tablet TAKE 1 TABLET BY MOUTH EVERY DAY 30 tablet 10  . LUMIGAN 0.01 % SOLN Place 1 drop into both eyes daily.  0  . nitroGLYCERIN (NITROSTAT) 0.4 MG SL tablet Place 1 tablet (0.4 mg total) under the tongue every 5 (five) minutes as needed for chest pain. 25 tablet 3  .  RESTASIS 0.05 % ophthalmic emulsion Place 1 drop into both eyes daily.  11  . spironolactone (ALDACTONE) 25 MG tablet Take 0.5 tablets (12.5 mg total) by mouth daily. 30 tablet 10  . timolol (TIMOPTIC-XR) 0.5 % ophthalmic gel-forming Place 1 drop into both eyes daily.  5   No current facility-administered medications for this visit.     Social History   Social History  . Marital status: Legally Separated    Spouse name: N/A  . Number of children: 3  . Years of education: N/A   Occupational History  . Retired Retired   Social History Main Topics  . Smoking status: Former Smoker    Years: 50.00    Types: Cigarettes    Quit date: 11/23/1999  . Smokeless tobacco: Never Used  . 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 Drug use: No  . Sexual activity: Yes   Other Topics Concern  . Not on file   Social History Narrative  . No narrative on file    Socially, he is divorced and has 4 children, and 7 grandchildren.  ROS General: Negative; No fevers, chills, or night sweats;  HEENT: Negative; No changes in vision or hearing, sinus congestion, difficulty swallowing Pulmonary: Negative; No cough, wheezing, shortness of breath, hemoptysis Cardiovascular:  See history of present illness GI: He has a history of GERD, which is controlled with pantoprazole No nausea, vomiting, diarrhea, or abdominal pain GU: Negative; No dysuria, hematuria, or difficulty voiding Musculoskeletal: Negative; no myalgias, joint pain, or weakness Hematologic/Oncology: Negative; no easy bruising, bleeding Endocrine: Negative; no heat/cold intolerance; no diabetes Neuro: Negative; no changes in balance, headaches Skin: Negative; No rashes or skin lesions Psychiatric: Negative; No behavioral problems, depression Sleep: Negative; No snoring, daytime sleepiness, hypersomnolence, bruxism, restless legs, hypnogognic hallucinations, no cataplexy Other  comprehensive 14 point system review is negative.   PE BP 130/70   Pulse (!) 54   Ht '5\' 8"'$  (1.727 m)   Wt 169 lb (76.7 kg)   BMI 25.70 kg/m    Repeat blood pressure by me was 130/72.  Wt Readings from Last 3 Encounters:  04/04/17 169 lb (76.7 kg)  01/07/17 171 lb 6.4 oz (77.7 kg)  11/17/16 169 lb 3.2 oz (76.7 kg)   General: Alert, oriented, no distress.  Skin: normal turgor, no rashes HEENT: Normocephalic, atraumatic. Pupils round and reactive; sclera anicteric;no lid lag.  Nose without nasal septal hypertrophy Mouth/Parynx benign; Mallinpatti scale 3 Chest wall: Nontender to palpation Neck: No JVD, no carotid bruits with normal carotid upstroke. Lungs: clear to ausculatation and percussion; no wheezing or rales Heart: Bradycardic with heart rate at 54, s1 s2 normal 1/6 systolic murmur. No S3 or S4 gallop; no diastolic murmur. No rubs thrills or heaves Abdomen: soft, nontender; no hepatosplenomehaly, BS+; abdominal aorta nontender and not dilated by palpation. No CVA tenderness Pulses 2+ Extremities: no clubbing cyanosis or edema, Homan's sign negative  Neurologic: grossly nonfocal Psychological: Normal affect and mood  ECG (independently read by me): Low atrial rhythm with negative P waves inferiorly.  Heart rate 54 bpm.  Left axis deviation.  First-degree AV block with a PR interval of 318 which is slightly decreased from previously. No significant ST-T changes.    February 2018 ECG (independently read by me): Probable low atrial rhythm with ventricular rate of 54 bpm.  First-degree AV block with a PR interval 332 ms.  Nonspecific ST-T changes.  July 2017 ECG (independently read by me): Sinus rhythm.  She 1 bpm without ectopy.  First-degree AV block.  Nonspecific ST-T changes.  January 2017 ECG (independently read by me):  Normal sinus rhythm at 62 bpm. First-degree AV block. Isolated PVC.  August 2015 ECG (independently read by me): Sinus rhythm at 61 beats per minute  with a mild sinus arrhythmia and first-degree AV block.  Nonspecific ST change  Prior 01/01/2014 ECG (independently read by me): Sinus bradycardia with marked first-degree AV block with PR interval at 348 ms. QTc interval 457 ms. No significant ST-T changes.  Prior ECG from 06/27/2013: Normal sinus rhythm at 64 beats per minute with first-degree AV block. Voltaren 6 ms. QTC interval 464 ms.  LABS: BMP Latest Ref Rng & Units 01/24/2017 01/01/2017 03/12/2016  Glucose 65 - 99 mg/dL 95  115(H) 115(H)  BUN 7 - 25 mg/dL _0 Creatinine 0.70 - 1.18 mg/dL 1.37(H) 1.74(H) 1.37(H)  Sodium 135 - 146 mmol/L 140 139 140  Potassium 3.5 - 5.3 mmol/L 4.2 3.7 4.2  Chloride 98 - 110 mmol/L 106 107 109  CO2 20 - 31 mmol/L _1 Calcium 8.6 - 10.3 mg/dL 9.5 9.5 8.8(L)   Hepatic Function Latest Ref Rng & Units 01/24/2017 03/11/2016 03/10/2016  Total Protein 6.1 - 8.1 g/dL 7.3 7.6 8.4(H)  Albumin 3.6 - 5.1 g/dL 3.9 3.6 4.2  AST 10 - 35 U/L 15 52(H) 30  ALT 9 - 46 U/L 10 44 22  Alk Phosphatase 40 - 115 U/L 74 60 67  Total Bilirubin 0.2 - 1.2 mg/dL 0.8 1.8(H) 1.2  Bilirubin, Direct 0.0 - 0.3 mg/dL - - -   CBC Latest Ref Rng & Units 01/24/2017 01/01/2017 03/11/2016  WBC 3.8 - 10.8 K/uL 3.9 5.1 13.6(H)  Hemoglobin 13.2 - 17.1 g/dL 14.0 14.3 14.3  Hematocrit 38.5 - 50.0 % 42.4 41.7 42.6  Platelets 140 - 400 K/uL 172 185 163   Lab Results  Component Value Date   MCV 89.6 01/24/2017   MCV 88.7 01/01/2017   MCV 87.8 03/11/2016   Lab Results  Component Value Date   TSH 1.75 01/24/2017   Lab Results  Component Value Date   HGBA1C 5.9 (H) 05/26/2012   Lipid Panel     Component Value Date/Time   CHOL 111 01/24/2017 0950   TRIG 42 01/24/2017 0950   HDL 50 01/24/2017 0950   CHOLHDL 2.2 01/24/2017 0950   VLDL 8 01/24/2017 0950   LDLCALC 53 01/24/2017 0950     RADIOLOGY: No results found.  IMPRESSION:  1. Essential hypertension   2. CKD (chronic kidney disease) stage 3, GFR 30-59 ml/min   3.  First degree heart block   4. History of cardiomyopathy     ASSESSMENT AND PLAN: Mr. Madlock Is a 76 year old African-American male who has a history of a nonischemic cardiomyopathy with a remote ejection fraction  at 25%. His LV function has normalized on current medical regimen and on his  echo Doppler assessment of May 2016 was 45 - 50%. Presently, he is not having any signs of CHF or volume overload. His blood pressure today is well controlled on amlodipine 10 mg, furosemide 20 mg every other day, and spironolactone 12.5 daily.  His renal function has improved with reducing his furosemide dose as well as spironolactone. He is not having any anginal symptoms on his current dose of 30 mg  isosorbide mononitrate. He does have significant first-degree AV block which has been present previously.  His ECG today shows a low atrial rhythm with negative P waves in lead 3.  He is not on any rate control medication.   Remotely, his amiodarone was reduced and discontinued due to bradycardia and his heart block.  I reviewed his blood work from 01/24/2017 with him in detail.  Lipid studies are excellent, not on therapy.  His creatinine is now back at baseline.  He has had some nonexertional sharp twinges of atypical chest pain which are nonischemic.  He sees Dr. Criss Rosales at three-month intervals.  As long as he remains stable, I will see him in one year for reevaluation.  Time spent: 25 minutes  Troy Sine, MD, [facility]  04/04/2017 9:02 AM

## 2017-04-08 ENCOUNTER — Ambulatory Visit: Payer: Medicare Other | Admitting: Cardiovascular Disease

## 2017-05-22 ENCOUNTER — Other Ambulatory Visit: Payer: Self-pay | Admitting: Cardiovascular Disease

## 2017-06-07 DIAGNOSIS — H04123 Dry eye syndrome of bilateral lacrimal glands: Secondary | ICD-10-CM | POA: Diagnosis not present

## 2017-06-07 DIAGNOSIS — H11153 Pinguecula, bilateral: Secondary | ICD-10-CM | POA: Diagnosis not present

## 2017-06-07 DIAGNOSIS — H401133 Primary open-angle glaucoma, bilateral, severe stage: Secondary | ICD-10-CM | POA: Diagnosis not present

## 2017-06-12 ENCOUNTER — Other Ambulatory Visit: Payer: Self-pay | Admitting: Cardiovascular Disease

## 2017-06-14 ENCOUNTER — Encounter (HOSPITAL_COMMUNITY): Payer: Self-pay | Admitting: *Deleted

## 2017-06-14 ENCOUNTER — Emergency Department (HOSPITAL_COMMUNITY)
Admission: EM | Admit: 2017-06-14 | Discharge: 2017-06-14 | Disposition: A | Discharge status: Home / Self Care | Payer: Medicare Other | Attending: Emergency Medicine | Admitting: Emergency Medicine

## 2017-06-14 DIAGNOSIS — M549 Dorsalgia, unspecified: Secondary | ICD-10-CM | POA: Diagnosis not present

## 2017-06-14 NOTE — ED Notes (Signed)
Pt called for room C24, no response at this time

## 2017-06-14 NOTE — ED Triage Notes (Signed)
Pt c/o R lower back pain that radiates to R leg onset x 2-3 wks intermittently with worsening pain, denies new injury, denies bowel & bladder incontinence, pt ambulatory in triage, pt A&O x4, hx of back sx in 2013 d/t fall

## 2017-06-15 ENCOUNTER — Emergency Department (HOSPITAL_COMMUNITY): Payer: Medicare Other

## 2017-06-15 ENCOUNTER — Encounter (HOSPITAL_COMMUNITY): Payer: Self-pay | Admitting: Emergency Medicine

## 2017-06-15 ENCOUNTER — Emergency Department (HOSPITAL_COMMUNITY)
Admission: EM | Admit: 2017-06-15 | Discharge: 2017-06-15 | Disposition: A | Discharge status: Home / Self Care | Payer: Medicare Other | Attending: Emergency Medicine | Admitting: Emergency Medicine

## 2017-06-15 DIAGNOSIS — M5441 Lumbago with sciatica, right side: Secondary | ICD-10-CM | POA: Diagnosis not present

## 2017-06-15 DIAGNOSIS — I2510 Atherosclerotic heart disease of native coronary artery without angina pectoris: Secondary | ICD-10-CM | POA: Diagnosis not present

## 2017-06-15 DIAGNOSIS — M545 Low back pain: Secondary | ICD-10-CM | POA: Diagnosis present

## 2017-06-15 DIAGNOSIS — N183 Chronic kidney disease, stage 3 (moderate): Secondary | ICD-10-CM | POA: Insufficient documentation

## 2017-06-15 DIAGNOSIS — I130 Hypertensive heart and chronic kidney disease with heart failure and stage 1 through stage 4 chronic kidney disease, or unspecified chronic kidney disease: Secondary | ICD-10-CM | POA: Diagnosis not present

## 2017-06-15 DIAGNOSIS — I5023 Acute on chronic systolic (congestive) heart failure: Secondary | ICD-10-CM | POA: Diagnosis not present

## 2017-06-15 DIAGNOSIS — Z7982 Long term (current) use of aspirin: Secondary | ICD-10-CM | POA: Insufficient documentation

## 2017-06-15 DIAGNOSIS — G8929 Other chronic pain: Secondary | ICD-10-CM | POA: Diagnosis not present

## 2017-06-15 DIAGNOSIS — Z87891 Personal history of nicotine dependence: Secondary | ICD-10-CM | POA: Diagnosis not present

## 2017-06-15 DIAGNOSIS — Z79899 Other long term (current) drug therapy: Secondary | ICD-10-CM | POA: Diagnosis not present

## 2017-06-15 DIAGNOSIS — S32009A Unspecified fracture of unspecified lumbar vertebra, initial encounter for closed fracture: Secondary | ICD-10-CM | POA: Diagnosis not present

## 2017-06-15 MED ORDER — HYDROCODONE-ACETAMINOPHEN 5-325 MG PO TABS: 1.0000 | ORAL_TABLET | Freq: Four times a day (QID) | ORAL | 0 refills | Status: DC | PRN

## 2017-06-15 NOTE — ED Triage Notes (Signed)
Pt comes in with complaints of right lower back pain that has been worsening over the past few weeks.  Denies any injury or bowel or bladder changes.  Ambulatory to triage.  A&O x4. Hx of chronic back pain after a surgery in 2013. Denies taking any medications at home for pain.  Seen at [facility] yesterday for same symptoms.

## 2017-06-15 NOTE — ED Provider Notes (Signed)
[facility] DEPT Provider Note   CSN: [id number] Arrival date & time: 06/15/17  1252     History   Chief Complaint No chief complaint on file.   HPI [name] is a 76 y.o. male with PMH/o Lumbar Fusion, chronic back pain who presents with right lower back pain that has been worsening over the last week. Patient has a history of chronic back pain and normally takes Tylenol for pain. He reports that approximately a week ago he was outside raking the leaves noticed that his back pain worsened afterwards. He has been taking Tylenol with no improvement in symptoms. He notes that he intermittently will experience pain radiating down the posterior aspect of his right leg. He has been able to ambulate without any difficulty. He denies any new trauma or fall or injury. She does have a history of back surgery. He reports that  he has had previous lumbar fusions from where he fell out of tree in 2013. Denies fevers, weight loss, night sweats, numbness/weakness of upper and lower extremities, bowel/bladder incontinence, saddle anesthesia, history of IVDA.   The history is provided by the patient.    Past Medical History:  Diagnosis Date  . CAD (coronary artery disease) 80% stenosis diag of the LAD, 30% in OM2 branch of LCX in 2009    a. Nonobstructive CAD by cath 11/2011 with the exception of the pre-existing diagonal branch #2 lesion.  . Chronic systolic CHF (congestive heart failure) ([place])   . CKD (chronic kidney disease) stage 3, GFR 30-59 ml/min   . Colon polyp, hyperplastic   . History of stress test 06/01/2012   Normal myocardial perfusion study. compared to the previous study there is no significant change. this is a low risk scan  . Hypertension   . Legally blind    "both eyes"  . Myocardial infarction ([place]) 11/22/11  . NICM (nonischemic cardiomyopathy) ([place])    a. Remote hx of dilated NICM with EF ranging 20-45%, including normal EF by echo (55-60%) in 2014.  Marland Kitchen Peripheral  arterial disease ([place])    a. 06/2014: ABI right 0.99, left 1.2, LE dopplers revealing an occluded right posterior tibial. As symptoms were not felt r/t claudication, no further w/u at the time.  Marland Kitchen PVC's (premature ventricular contractions)   . 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 Requiring discontinuation of BB dose.  Marland Kitchen Spondylolisthesis   . Ventricular bigeminy   . Ventricular tachycardia (paroxysmal) ([place]) 04/11/2015    Patient Active Problem List   Diagnosis Date Noted  . Unsteady gait 03/12/2016  . Nausea vomiting and diarrhea 03/11/2016  . UTI (lower urinary tract infection) 03/11/2016  . Gastroenteritis 03/11/2016  . Obstipation 03/11/2016  . Abdominal pain   . Constipation   . Lactic acidosis   . Leg pain   . Essential hypertension 03/04/2016  . Ventricular tachycardia (paroxysmal) ([place]) 04/11/2015  . Chest pain with moderate risk for cardiac etiology 04/09/2015  . CAP (community acquired pneumonia) 02/18/2015  . Chest pain at rest 02/18/2015  . Peripheral arterial disease ([place]) 08/15/2014  . Hyperlipidemia with target LDL less than 70 01/01/2014  . GERD (gastroesophageal reflux disease) 01/01/2014  . First degree AV block 07/31/2012  . Unstable angina, negative MI 06/05/2012  . Acute on chronic systolic CHF (congestive heart failure) ([place]) 06/05/2012  . 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 CKD (chronic kidney disease) stage 3, GFR 30-59 ml/min 05/27/2012  . 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 Chronic systolic heart failure ([place]) 11/23/2011    Class: Diagnosis of  . CAD (coronary artery disease)  11/22/2011  . Premature ventricular contractions 11/22/2011  . Esophageal reflux 07/28/2011    Past Surgical History:  Procedure Laterality Date  . BACK SURGERY    . CARDIAC CATHETERIZATION  11/2011  . CARDIAC CATHETERIZATION  11/2011   didn't demonstrate high grade  obstructive disease to account for his LV dysfunction.  Marland Kitchen CATARACT EXTRACTION, BILATERAL  1990's  . CYSTOSCOPY    . CYSTOSCOPY WITH URETHRAL DILATATION N/A 05/04/2013   Procedure: CYSTOSCOPY WITH URETHRAL DILATATION;  Surgeon: Eustace Moore, MD;  Location: [facility];  Service: Neurosurgery;  Laterality: N/A;  with insertion of foley catheter  . EP study and ablation of VT  7/13   PVC focus mapped to the right coronary cusp of the aorta, limited ablation performed due to proximity of the focus to the right coronary artery  . EYE SURGERY    . LEFT HEART CATHETERIZATION WITH CORONARY ANGIOGRAM N/A 11/25/2011   Procedure: LEFT HEART CATHETERIZATION WITH CORONARY ANGIOGRAM;  Surgeon: Leonie Man, MD;  Location: [facility] CATH LAB;  Service: Cardiovascular;  Laterality: N/A;  . POSTERIOR FUSION [place]  . ROTATOR CUFF REPAIR  2000's   left  . V-TACH ABLATION N/A 06/06/2012   Procedure: V-TACH ABLATION;  Surgeon: Thompson Grayer, MD;  Location[facility] CATH LAB;  Service: Cardiovascular;  Laterality: N/A;       Home Medications    Prior to Admission medications   Medication Sig Start Date End Date Taking? Authorizing Provider  amLODipine (NORVASC) 10 MG tablet TAKE 1 TABLET(10 MG) BY MOUTH DAILY 05/23/17   Troy Sine, MD  aspirin 81 MG tablet Take 81 mg by mouth daily.    [provider]  fluticasone furoate-vilanterol (BREO ELLIPTA) 100-25 MCG/INH AEPB Inhale 1 puff into the lungs daily. (For 14 days)    [provider]  furosemide (LASIX) 20 MG tablet TAKE 1 TABLET(20 MG) BY MOUTH DAILY 02/11/17   Troy Sine, MD  HYDROcodone-acetaminophen (NORCO/VICODIN) 5-325 MG tablet Take 1 tablet by mouth every 6 (six) hours as needed. 06/15/17   Volanda Napoleon, PA-C  isosorbide mononitrate (IMDUR) 30 MG 24 hr tablet TAKE 1 TABLET BY MOUTH EVERY DAY 06/13/17   Troy Sine, MD  LUMIGAN 0.01 % SOLN Place 1 drop into both eyes daily. 12/06/14   [provider]    nitroGLYCERIN (NITROSTAT) 0.4 MG SL tablet Place 1 tablet (0.4 mg total) under the tongue every 5 (five) minutes as needed for chest pain. 02/26/15   Troy Sine, MD  RESTASIS 0.05 % ophthalmic emulsion Place 1 drop into both eyes daily. 12/30/14   [provider]  spironolactone (ALDACTONE) 25 MG tablet Take 0.5 tablets (12.5 mg total) by mouth daily. 01/07/17   Troy Sine, MD  timolol (TIMOPTIC-XR) 0.5 % ophthalmic gel-forming Place 1 drop into both eyes daily. 12/10/14   [provider]    Family History Family History  Problem Relation Age of Onset  . Asthma Mother   . Other Unknown        Unsure if any heart disease in his family.    Social History Social History  Substance Use Topics  . Smoking status: Former Smoker    Years: 50.00    Types: Cigarettes    Quit date: 11/23/1999  . Smokeless tobacco: Never  Used  . 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   Review of Systems Review of Systems  Genitourinary: Negative for dysuria and hematuria.  Musculoskeletal: Positive for back pain. Negative for neck pain.  Neurological: Negative for weakness and numbness.     Physical Exam Updated Vital Signs BP (!) 151/77 (BP Location: Left Arm)   Pulse (!) 58   Temp 97.7 F (36.5 C) (Oral)   Resp 18   Ht 5\' 8"  (1.727 m)   Wt 75.3 kg (166 lb)   SpO2 99%   BMI 25.24 kg/m   Physical Exam  Constitutional: He is oriented to person, place, and time. He appears well-developed and well-nourished.  Sitting comfortably on examination table  HENT:  Head: Normocephalic and atraumatic.  Mouth/Throat: Oropharynx is clear and moist and mucous membranes are normal.  Eyes: Pupils are equal, round, and reactive to light. Conjunctivae, EOM and lids are normal.  Neck: Full passive range of motion without pain.  Full flexion/extension and lateral movement of neck  fully intact. No bony midline tenderness. No deformities or crepitus.   Cardiovascular: Normal rate, regular rhythm, normal heart sounds and normal pulses.  Exam reveals no gallop and no friction rub.   No murmur heard. Pulmonary/Chest: Effort normal and breath sounds normal.  Abdominal: Soft. Normal appearance. There is no tenderness. There is no rigidity and no guarding.  Musculoskeletal: Normal range of motion.       Thoracic back: He exhibits no tenderness.  Diffuse tenderness over the right paraspinal muscles of the lumbar rehat extends over to the entire lumbar region. No midline bony tenderness. No deformities or crepitus noted. Flexion/extension of back intact without difficulty.  Neurological: He is alert and oriented to person, place, and time.  Follows commands, Moves all extremities  5/5 strength to BUE and BLE  Sensation intact throughout  No gait abnormalities  Skin: Skin is warm and dry. Capillary refill takes less than 2 seconds.  Psychiatric: He has a normal mood and affect. His speech is normal.  Nursing note and vitals reviewed.    ED Treatments / Results  Labs (all labs ordered are listed, but only abnormal results are displayed) Labs Reviewed - No data to display  EKG  EKG Interpretation None       Radiology Dg Lumbar Spine Complete  Result Date: 06/15/2017 CLINICAL DATA:  76 year old male with lumbar spine pain for the past 2 weeks progressive today after working in the yard. No fall 4 definite injury. EXAM: LUMBAR SPINE - COMPLETE 4+ VIEW COMPARISON:  Prior lumbar spine radiographs 06/29/2016 the whole prior CT scan of the abdomen and pelvis 03/10/2016 FINDINGS: Surgical changes of prior long segment posterior lumbar interbody fusion with bilateral pedicle screw and rod construct extending from T11 through L4 with interbody grafts at L2-L3 and L3-L4. Additionally, there is only ankylosis of the L4-L5 and L5-S1 with interbody grafts at these levels suggesting a  prior fixation procedure at L4-L5 and L5-S1. No hardware is present at this time. There is no evidence of hardware complication. Stable compression deformity of the L1 and L5 vertebral bodies with approximately 50% height loss anteriorly. No acute fracture or malalignment. Atherosclerotic calcifications are present in the abdominal aorta. Surgical clips in the right upper quadrant consistent with prior cholecystectomy. Rounded radiopacity projects over the upper pole of the right renal shadow  consistent with nephrolithiasis. IMPRESSION: 1. Surgical changes of prior thoracolumbar posterior fusion extending from T11 through L4 without evidence of hardware complication. 2. 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 3. No acute fracture or malalignment. 4.  Aortic Atherosclerosis (ICD10-170.0) 5. Stable L1 and L5 compression fractures. Electronically Signed   By: Jacqulynn Cadet M.D.   On: 06/15/2017 15:00    Procedures Procedures (including critical care time)  Medications Ordered in ED Medications - No data to display   Initial Impression / Assessment and Plan / ED Course  I have reviewed the triage vital signs and the nursing notes.  Pertinent labs & imaging results that were available during my care of the patient were reviewed by me and considered in my medical decision making (see chart for details).     76 year old male with history of chronic back pain who presents with right-sided back pain that has been worsening for the last week. Patient reports that pain began after he was outside raking the leaves. Not controlled with Tylenol. Patient went to [facility] yesterday for evaluation but left before being seen because he was too long.Patient is afebrile, non-toxic appearing, sitting comfortably on examination table. Vital signs reviewed and stable. Patient no neuro  deficits on exam. Consider exacerbation of chronic back pain versus muscular strain given history/physical exam. Plan to x-ray for evaluation of intact hardware. Patient updated on plan.  X-ray reviewed. Hardware is intact without any changes. He does have stable L1 and L5 compression fractures but no other acute abnormalities. Discussed results with patient. Instructed him to follow up with his primary care doctor and his surgeon in the next few days for further evaluation. Will provide symptomatic pain control for patient. Strict return precautions discussed. Patient expresses understanding and agreement to plan.  Final Clinical Impressions(s) / ED Diagnoses   Final diagnoses:  Chronic right-sided low back pain with right-sided sciatica    New Prescriptions New Prescriptions   HYDROCODONE-ACETAMINOPHEN (NORCO/VICODIN) 5-325 MG TABLET    Take 1 tablet by mouth every 6 (six) hours as needed.     Volanda Napoleon, PA-C 06/15/17 2105    Sharlett Iles, MD 06/15/17 2132

## 2017-06-15 NOTE — Discharge Instructions (Signed)
Follow-up to her primary care doctor next 24-48 hours for further evaluation.  Follow-up with your back surgeon in the next few days for further evaluation.  You can take the pain medication as needed for breakthrough pain. Do not take Tylenol or ibuprofen at the same time we are taking the pain medication.  Return immediately to the emergency department for any worsening pain, fever, night sweats, weight loss, difficulty walking, numbness/weakness of her arms or legs, urinary or bowel accidents or any other worsening or concerning symptoms.

## 2017-06-21 DIAGNOSIS — M549 Dorsalgia, unspecified: Secondary | ICD-10-CM | POA: Diagnosis not present

## 2017-06-21 DIAGNOSIS — I10 Essential (primary) hypertension: Secondary | ICD-10-CM | POA: Diagnosis not present

## 2017-06-29 ENCOUNTER — Ambulatory Visit (INDEPENDENT_AMBULATORY_CARE_PROVIDER_SITE_OTHER): Payer: Medicare Other | Admitting: Podiatry

## 2017-06-29 ENCOUNTER — Encounter: Payer: Self-pay | Admitting: Podiatry

## 2017-06-29 DIAGNOSIS — I739 Peripheral vascular disease, unspecified: Secondary | ICD-10-CM | POA: Diagnosis not present

## 2017-06-29 DIAGNOSIS — B351 Tinea unguium: Secondary | ICD-10-CM | POA: Diagnosis not present

## 2017-06-29 DIAGNOSIS — M79676 Pain in unspecified toe(s): Secondary | ICD-10-CM | POA: Diagnosis not present

## 2017-06-29 NOTE — Progress Notes (Signed)
Patient ID: [name], male   DOB: 1941/09/17, 76 y.o.   MRN: [id number]    Subjective: This patient presents for a scheduled visit complaining of elongated and thickened toenails which are comfortable walking wearing shoes and request toenail debridement Patient also complains of right inferior heel pain in the last month. He has had evaluation in emergency department for right lower extremity pain in April 2017 with negative DVT and Rx of tramadol dated for pain control 4 7 2017  Objective: Orientated 3 No peripheral edema DP pulses 2/4 bilaterally PT pulses 0/4 right and 2/4 left Capillary reflex delayed bilaterally Sensation to 10 g monofilament wire intact 5/5 bilaterally Vibratory sensation nonreactive bilaterally Ankle reflexes weakly reactive bilaterally HAV bilaterally No open skin lesions bilaterally Absent hair growth bilaterally Atrophic skin bilaterally Well-healed surgical scar dorsal right foot The toenails are hypertrophic, elongated, discolored, deformed and tender direct palpation 6-10 Atrophic skin with absent hair growth bilaterally  Assessment: Symptomatic onychomycoses 6-10 Peripheral arterial disease evaluated by Dr. Gwenlyn Found   Plan: Debridement toenails 6-10 mechanically and electronically without any bleeding  Reappoint 3 months

## 2017-07-04 ENCOUNTER — Telehealth: Payer: Self-pay | Admitting: Cardiovascular Disease

## 2017-07-04 NOTE — Telephone Encounter (Signed)
LM w/last BP readings on confidential VM

## 2017-07-04 NOTE — Telephone Encounter (Signed)
Merrilee Seashore  (Case Manager) calling with [name]. Merrilee Seashore would like to verify the patient's latest BP. Patient is in heart health program. Merrilee Seashore states that you may leave your name, best time to call on his voicemail.

## 2017-07-05 DIAGNOSIS — M545 Low back pain: Secondary | ICD-10-CM | POA: Diagnosis not present

## 2017-07-08 DIAGNOSIS — M545 Low back pain: Secondary | ICD-10-CM | POA: Diagnosis not present

## 2017-07-20 DIAGNOSIS — M545 Low back pain: Secondary | ICD-10-CM | POA: Diagnosis not present

## 2017-07-26 DIAGNOSIS — M545 Low back pain: Secondary | ICD-10-CM | POA: Diagnosis not present

## 2017-08-02 DIAGNOSIS — M545 Low back pain: Secondary | ICD-10-CM | POA: Diagnosis not present

## 2017-08-08 DIAGNOSIS — I10 Essential (primary) hypertension: Secondary | ICD-10-CM | POA: Diagnosis not present

## 2017-08-08 DIAGNOSIS — M545 Low back pain: Secondary | ICD-10-CM | POA: Diagnosis not present

## 2017-08-09 ENCOUNTER — Encounter: Payer: Self-pay | Admitting: Gastroenterology

## 2017-08-15 ENCOUNTER — Encounter: Payer: Self-pay | Admitting: Gastroenterology

## 2017-08-18 ENCOUNTER — Other Ambulatory Visit: Payer: Self-pay | Admitting: Cardiovascular Disease

## 2017-08-25 ENCOUNTER — Other Ambulatory Visit: Payer: Self-pay | Admitting: Neurological Surgery

## 2017-08-25 DIAGNOSIS — G8929 Other chronic pain: Secondary | ICD-10-CM

## 2017-08-25 DIAGNOSIS — M545 Low back pain: Principal | ICD-10-CM

## 2017-09-09 ENCOUNTER — Ambulatory Visit
Admission: RE | Admit: 2017-09-09 | Discharge: 2017-09-09 | Disposition: A | Discharge status: Home / Self Care | Payer: Medicare Other | Source: Ambulatory Visit | Attending: Neurological Surgery | Admitting: Neurological Surgery

## 2017-09-09 VITALS — BP 132/68 | HR 55

## 2017-09-09 DIAGNOSIS — G8929 Other chronic pain: Secondary | ICD-10-CM

## 2017-09-09 DIAGNOSIS — R2681 Unsteadiness on feet: Secondary | ICD-10-CM

## 2017-09-09 DIAGNOSIS — M545 Low back pain: Principal | ICD-10-CM

## 2017-09-09 DIAGNOSIS — M5124 Other intervertebral disc displacement, thoracic region: Secondary | ICD-10-CM | POA: Diagnosis not present

## 2017-09-09 MED ORDER — IOPAMIDOL (ISOVUE-M 300) INJECTION 61%
10.0000 mL | Freq: Once | INTRAMUSCULAR | Status: AC | PRN
  Administered 2017-09-09: 10 mL via INTRATHECAL

## 2017-09-09 MED ORDER — DIAZEPAM 5 MG PO TABS
5.0000 mg | ORAL_TABLET | Freq: Once | ORAL | Status: AC
Start: 2017-09-09 — End: 2017-09-09
  Administered 2017-09-09: 5 mg via ORAL

## 2017-09-09 NOTE — Discharge Instructions (Signed)

## 2017-09-12 DIAGNOSIS — M545 Low back pain: Secondary | ICD-10-CM | POA: Diagnosis not present

## 2017-09-22 DIAGNOSIS — M461 Sacroiliitis, not elsewhere classified: Secondary | ICD-10-CM | POA: Diagnosis not present

## 2017-09-22 DIAGNOSIS — M961 Postlaminectomy syndrome, not elsewhere classified: Secondary | ICD-10-CM | POA: Diagnosis not present

## 2017-09-26 ENCOUNTER — Encounter: Payer: Self-pay | Admitting: Podiatry

## 2017-09-26 ENCOUNTER — Ambulatory Visit (INDEPENDENT_AMBULATORY_CARE_PROVIDER_SITE_OTHER): Payer: Medicare Other | Admitting: Podiatry

## 2017-09-26 DIAGNOSIS — B351 Tinea unguium: Secondary | ICD-10-CM

## 2017-09-26 DIAGNOSIS — L84 Corns and callosities: Secondary | ICD-10-CM

## 2017-09-26 DIAGNOSIS — I739 Peripheral vascular disease, unspecified: Secondary | ICD-10-CM

## 2017-09-26 DIAGNOSIS — M79676 Pain in unspecified toe(s): Secondary | ICD-10-CM | POA: Diagnosis not present

## 2017-09-26 NOTE — Progress Notes (Signed)
Patient ID: [name], male   DOB: 1941/04/08, 76 y.o.   MRN: [id number]    Subjective: This patient presents for a scheduled visit complaining of elongated and thickened toenails which are comfortable walking wearing shoes and request toenail debridement Patient also complains of right inferior heel pain in the last month. He has had evaluation in emergency department for right lower extremity pain in April 2017 with negative DVT and Rx of tramadol dated for pain control 4 7 2017  Objective: Orientated 3 No peripheral edema DP pulses 2/4 bilaterally PT pulses 0/4 right and 2/4 left Capillary reflex delayed bilaterally Sensation to 10 g monofilament wire intact 5/5 bilaterally Vibratory sensation nonreactive bilaterally Ankle reflexes weakly reactive bilaterally HAV bilaterally No open skin lesions bilaterally Absent hair growth bilaterally Atrophic skin bilaterally Well-healed surgical scar dorsal right foot The toenails are hypertrophic, elongated, discolored, deformed and tender direct palpation 6-10 Atrophic skin with absent hair growth bilaterally Today to corn fifth right toe  Assessment: Symptomatic onychomycoses 6-10 Peripheral arterial disease evaluated by Dr. Christa See fifth right toe  Plan: Debridement toenails 6-10 mechanically and electronically without any bleeding Debrided corn 1 without any bleeding Dispensed gel pad wear over fifth right toe   Reappoint 3 months

## 2017-09-30 DIAGNOSIS — E785 Hyperlipidemia, unspecified: Secondary | ICD-10-CM | POA: Diagnosis not present

## 2017-09-30 DIAGNOSIS — N189 Chronic kidney disease, unspecified: Secondary | ICD-10-CM | POA: Diagnosis not present

## 2017-09-30 DIAGNOSIS — I10 Essential (primary) hypertension: Secondary | ICD-10-CM | POA: Diagnosis not present

## 2017-09-30 DIAGNOSIS — M549 Dorsalgia, unspecified: Secondary | ICD-10-CM | POA: Diagnosis not present

## 2017-09-30 DIAGNOSIS — M545 Low back pain: Secondary | ICD-10-CM | POA: Diagnosis not present

## 2017-09-30 DIAGNOSIS — H42 Glaucoma in diseases classified elsewhere: Secondary | ICD-10-CM | POA: Diagnosis not present

## 2017-10-04 DIAGNOSIS — M461 Sacroiliitis, not elsewhere classified: Secondary | ICD-10-CM | POA: Diagnosis not present

## 2017-10-05 ENCOUNTER — Encounter: Payer: Self-pay | Admitting: *Deleted

## 2017-10-11 ENCOUNTER — Ambulatory Visit (INDEPENDENT_AMBULATORY_CARE_PROVIDER_SITE_OTHER): Payer: Medicare Other | Admitting: Gastroenterology

## 2017-10-11 ENCOUNTER — Encounter (INDEPENDENT_AMBULATORY_CARE_PROVIDER_SITE_OTHER): Payer: Self-pay

## 2017-10-11 ENCOUNTER — Encounter: Payer: Self-pay | Admitting: Gastroenterology

## 2017-10-11 VITALS — BP 110/58 | HR 62 | Ht 68.0 in | Wt 170.4 lb

## 2017-10-11 DIAGNOSIS — Z1211 Encounter for screening for malignant neoplasm of colon: Secondary | ICD-10-CM | POA: Diagnosis not present

## 2017-10-11 NOTE — Progress Notes (Signed)
HPI :  76 year old male with a history of coronary artery disease, CKD, history of ventricular tachycardia, history of peripheral vascular disease, here for new patient visit to discuss when he is next due for colonoscopy.  His last colonoscopy was in 2011, he had a diminutive ascending hyperplastic polyp and multiple left-sided hyperplastic polyps.  He denies any changes to his bowel habits. He has occasional rare constipation, no diarrhea, generally regular bowel habits. He has no evidence of blood in the stools. He denies any abdominal pains. He eats very well, denies any dysphagia. He does have a history of benign esophageal stricture which led to dilation in the past, he denies this bothering him at present time. No nausea or vomiting. No family history of colon cancer known.  EGD 07/29/2011 - stricture at GEJ, dilated to 1mm, normal exam otherwise Colonoscopy 08/19/2010 - 7mm polyp ascending colon, left sided polyps - "hyperplastic" Colonoscopy 06/17/2005 - few polyps, adenomas  Past Medical History:  Diagnosis Date  . CAD (coronary artery disease) 80% stenosis diag of the LAD, 30% in OM2 branch of LCX in 2009    a. Nonobstructive CAD by cath 11/2011 with the exception of the pre-existing diagonal branch #2 lesion.  . Chronic systolic CHF (congestive heart failure) ([place])   . CKD (chronic kidney disease) stage 3, GFR 30-59 ml/min (HCC)   . Colon polyp, hyperplastic   . History of stress test 06/01/2012   Normal myocardial perfusion study. compared to the previous study there is no significant change. this is a low risk scan  . Hypertension   . Legally blind    "both eyes"  . Myocardial infarction ([place]) 11/22/11  . NICM (nonischemic cardiomyopathy) ([place])    a. Remote hx of dilated NICM with EF ranging 20-45%, including normal EF by echo (55-60%) in 2014.  Marland Kitchen Peripheral arterial disease ([place])    a. 06/2014: ABI right 0.99, left 1.2, LE dopplers revealing an occluded right posterior tibial.  As symptoms were not felt r/t claudication, no further w/u at the time.  Marland Kitchen PVC's (premature ventricular contractions)   . 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 Requiring discontinuation of BB dose.  Marland Kitchen Spondylolisthesis   . Ventricular bigeminy   . Ventricular tachycardia (paroxysmal) ([place]) 04/11/2015     Past Surgical History:  Procedure Laterality Date  . BACK SURGERY    . CARDIAC CATHETERIZATION  11/2011  . CARDIAC CATHETERIZATION  11/2011   didn't demonstrate high grade obstructive disease to account for his LV dysfunction.  Marland Kitchen CATARACT EXTRACTION, BILATERAL  1990's  . CYSTOSCOPY    . CYSTOSCOPY WITH URETHRAL DILATATION N/A 05/04/2013   Procedure: CYSTOSCOPY WITH URETHRAL DILATATION;  Surgeon: Eustace Moore, MD;  Location: [facility];  Service: Neurosurgery;  Laterality: N/A;  with insertion of foley catheter  . EP study and ablation of VT  7/13   PVC focus mapped to the right coronary cusp of the aorta, limited ablation performed due to proximity of the focus to the right coronary artery  . EYE SURGERY    . LEFT HEART CATHETERIZATION WITH CORONARY ANGIOGRAM N/A 11/25/2011   Procedure: LEFT HEART CATHETERIZATION WITH CORONARY ANGIOGRAM;  Surgeon: Leonie Man, MD;  Location[facility] CATH LAB;  Service: Cardiovascular;  Laterality: N/A;  . POSTERIOR FUSION [place]  . ROTATOR CUFF REPAIR  2000's   left  . V-TACH ABLATION N/A 06/06/2012   Procedure: V-TACH ABLATION;  Surgeon: Thompson Grayer, MD;  Location: [facility]  CATH LAB;  Service: Cardiovascular;  Laterality: N/A;   Family History  Problem Relation Age of Onset  . Asthma Mother   . Other Unknown        Unsure if any heart disease in his family.   Social History   Tobacco Use  . Smoking status: Former Smoker    Years: 50.00    Types: Cigarettes    Last attempt to quit: 11/23/1999    Years since quitting: 17.8  . Smokeless tobacco: Never Used  Substance Use Topics  . Alcohol use: No    Comment: 05/26/12 "used to be a  drunk; stopped drinking in the early 1980's"  . Drug use: No   Current Outpatient Medications  Medication Sig Dispense Refill  . amLODipine (NORVASC) 10 MG tablet TAKE 1 TABLET(10 MG) BY MOUTH DAILY 30 tablet 10  . aspirin 81 MG tablet Take 81 mg by mouth daily.    . fluticasone furoate-vilanterol (BREO ELLIPTA) 100-25 MCG/INH AEPB Inhale 1 puff into the lungs daily. (For 14 days)    . furosemide (LASIX) 20 MG tablet TAKE 1 TABLET(20 MG) BY MOUTH DAILY 30 tablet 2  . HYDROcodone-acetaminophen (NORCO/VICODIN) 5-325 MG tablet Take 1 tablet by mouth every 6 (six) hours as needed. 8 tablet 0  . isosorbide dinitrate (ISORDIL) 30 MG tablet Take 30 mg by mouth daily.    Marland Kitchen LUMIGAN 0.01 % SOLN Place 1 drop into both eyes daily.  0  . nitroGLYCERIN (NITROSTAT) 0.4 MG SL tablet Place 1 tablet (0.4 mg total) under the tongue every 5 (five) minutes as needed for chest pain. 25 tablet 3  . RESTASIS 0.05 % ophthalmic emulsion Place 1 drop into both eyes daily.  11  . spironolactone (ALDACTONE) 25 MG tablet Take 0.5 tablets (12.5 mg total) by mouth daily. 30 tablet 10  . timolol (TIMOPTIC) 0.5 % ophthalmic solution INT 1 GTT IN OU D  4   No current facility-administered medications for this visit.    No Known Allergies   Review of Systems: All systems reviewed and negative except where noted in HPI.   Lab Results  Component Value Date   WBC 3.9 01/24/2017   HGB 14.0 01/24/2017   HCT 42.4 01/24/2017   MCV 89.6 01/24/2017   PLT 172 01/24/2017    Lab Results  Component Value Date   CREATININE 1.37 (H) 01/24/2017   BUN 12 01/24/2017   NA 140 01/24/2017   K 4.2 01/24/2017   CL 106 01/24/2017   CO2 27 01/24/2017    Lab Results  Component Value Date   ALT 10 01/24/2017   AST 15 01/24/2017   ALKPHOS 74 01/24/2017   BILITOT 0.8 01/24/2017     Physical Exam: BP (!) 110/58   Pulse 62   Ht 5\' 8"  (1.727 m)   Wt 170 lb 6 oz (77.3 kg)   BMI 25.91 kg/m  Constitutional:  Pleasant,well-developed, male in no acute distress. HEENT: Normocephalic and atraumatic. Conjunctivae are normal. No scleral icterus. Neck supple.  Cardiovascular: Normal rate, regular rhythm.  Pulmonary/chest: Effort normal and breath sounds normal. No wheezing, rales or rhonchi. Abdominal: Soft, nondistended, nontender.  There are no masses palpable. No hepatomegaly. Extremities: no edema Lymphadenopathy: No cervical adenopathy noted. Neurological: Alert and oriented to person place and time. Skin: Skin is warm and dry. No rashes noted. Psychiatric: Normal mood and affect. Behavior is normal.   ASSESSMENT AND PLAN: 76 year old male with medical history as outlined above, here to discuss colon cancer screening.  He has a significant cardiovascular history. We discussed at his age whether or not he warrants any further screening exams. He had only hyperplastic polyps removed on his last colonoscopy, thus he would not be due for colon cancer screening until September 2021, as he is currently asymptomatic without anemia. At that point in time he will be 76 years old. I don't feel strongly that he warrants any further colonoscopy exams given his age and comorbidities. The patient feels otherwise and feels strongly about wanting to have 1 more exam at age 58. I explained that I would like to see him in clinic at that time to discuss this again determine if he really wants to have another exam. Depending on the status of his other medical problems, risks may outweigh benefits. He agreed and will follow up with me in 07/2020 to reassess.  Call in interim with questions  [place] Cellar, MD [facility] Pager [phone]  CC: Lucianne Lei, MD

## 2017-10-11 NOTE — Patient Instructions (Addendum)
If you are age 78 or older, your body mass index should be between 23-30. Your Body mass index is 25.91 kg/m. If this is out of the aforementioned range listed, please consider follow up with your Primary Care Provider.  If you are age 23 or younger, your body mass index should be between 19-25. Your Body mass index is 25.91 kg/m. If this is out of the aformentioned range listed, please consider follow up with your Primary Care Provider.   We will contact you when you are due for an office visit with Dr. Havery Moros in September of 2021.   Thank you and Happy Thanksgiving.

## 2017-10-21 DIAGNOSIS — H401133 Primary open-angle glaucoma, bilateral, severe stage: Secondary | ICD-10-CM | POA: Diagnosis not present

## 2017-10-21 DIAGNOSIS — Z961 Presence of intraocular lens: Secondary | ICD-10-CM | POA: Diagnosis not present

## 2017-10-21 DIAGNOSIS — H04123 Dry eye syndrome of bilateral lacrimal glands: Secondary | ICD-10-CM | POA: Diagnosis not present

## 2017-10-21 DIAGNOSIS — H35033 Hypertensive retinopathy, bilateral: Secondary | ICD-10-CM | POA: Diagnosis not present

## 2017-10-24 DIAGNOSIS — M545 Low back pain: Secondary | ICD-10-CM | POA: Diagnosis not present

## 2017-10-28 ENCOUNTER — Encounter (HOSPITAL_COMMUNITY): Payer: Self-pay | Admitting: Emergency Medicine

## 2017-10-28 ENCOUNTER — Emergency Department (HOSPITAL_COMMUNITY)
Admission: EM | Admit: 2017-10-28 | Discharge: 2017-10-29 | Disposition: A | Discharge status: Home / Self Care | Payer: Medicare Other | Attending: Emergency Medicine | Admitting: Emergency Medicine

## 2017-10-28 ENCOUNTER — Emergency Department (HOSPITAL_COMMUNITY): Payer: Medicare Other

## 2017-10-28 ENCOUNTER — Other Ambulatory Visit: Payer: Self-pay

## 2017-10-28 DIAGNOSIS — R072 Precordial pain: Secondary | ICD-10-CM | POA: Diagnosis not present

## 2017-10-28 DIAGNOSIS — I252 Old myocardial infarction: Secondary | ICD-10-CM | POA: Insufficient documentation

## 2017-10-28 DIAGNOSIS — Z87891 Personal history of nicotine dependence: Secondary | ICD-10-CM | POA: Diagnosis not present

## 2017-10-28 DIAGNOSIS — I130 Hypertensive heart and chronic kidney disease with heart failure and stage 1 through stage 4 chronic kidney disease, or unspecified chronic kidney disease: Secondary | ICD-10-CM | POA: Diagnosis not present

## 2017-10-28 DIAGNOSIS — I5022 Chronic systolic (congestive) heart failure: Secondary | ICD-10-CM | POA: Insufficient documentation

## 2017-10-28 DIAGNOSIS — Z79899 Other long term (current) drug therapy: Secondary | ICD-10-CM | POA: Insufficient documentation

## 2017-10-28 DIAGNOSIS — N183 Chronic kidney disease, stage 3 (moderate): Secondary | ICD-10-CM | POA: Diagnosis not present

## 2017-10-28 DIAGNOSIS — R0602 Shortness of breath: Secondary | ICD-10-CM | POA: Diagnosis not present

## 2017-10-28 DIAGNOSIS — I2510 Atherosclerotic heart disease of native coronary artery without angina pectoris: Secondary | ICD-10-CM | POA: Insufficient documentation

## 2017-10-28 DIAGNOSIS — R079 Chest pain, unspecified: Secondary | ICD-10-CM | POA: Diagnosis not present

## 2017-10-28 DIAGNOSIS — Z7982 Long term (current) use of aspirin: Secondary | ICD-10-CM | POA: Diagnosis not present

## 2017-10-28 LAB — CBC
HCT: 45.1 % (ref 39.0–52.0)
Hemoglobin: 15.8 g/dL (ref 13.0–17.0)
MCH: 30.9 pg (ref 26.0–34.0)
MCHC: 35 g/dL (ref 30.0–36.0)
MCV: 88.1 fL (ref 78.0–100.0)
Platelets: 167 10*3/uL (ref 150–400)
RBC: 5.12 MIL/uL (ref 4.22–5.81)
RDW: 12.2 % (ref 11.5–15.5)
WBC: 6.9 10*3/uL (ref 4.0–10.5)

## 2017-10-28 LAB — BASIC METABOLIC PANEL
Anion gap: 9 (ref 5–15)
BUN: 15 mg/dL (ref 6–20)
CALCIUM: 9.4 mg/dL (ref 8.9–10.3)
CO2: 25 mmol/L (ref 22–32)
Chloride: 102 mmol/L (ref 101–111)
Creatinine, Ser: 1.6 mg/dL — ABNORMAL HIGH (ref 0.61–1.24)
GFR calc non Af Amer: 40 mL/min — ABNORMAL LOW (ref >60)
GFR, EST AFRICAN AMERICAN: 46 mL/min — AB (ref >60)
Glucose, Bld: 137 mg/dL — ABNORMAL HIGH (ref 65–99)
Potassium: 3.8 mmol/L (ref 3.5–5.1)
SODIUM: 136 mmol/L (ref 135–145)

## 2017-10-28 LAB — I-STAT TROPONIN, ED
TROPONIN I, POC: 0.01 ng/mL (ref 0.00–0.08)
Troponin i, poc: 0.03 ng/mL (ref 0.00–0.08)

## 2017-10-28 MED ORDER — FAMOTIDINE 20 MG PO TABS: 20.0000 mg | ORAL_TABLET | Freq: Two times a day (BID) | ORAL | 0 refills | Status: DC

## 2017-10-28 NOTE — ED Provider Notes (Signed)
[facility] EMERGENCY DEPARTMENT Provider Note   CSN: [id number] Arrival date & time: 10/28/17  1547     History   Chief Complaint Chief Complaint  Patient presents with  . Chest Pain    HPI [name] is a 76 y.o. male.  Patient with onset of substernal chest pain more to the right at 1700 yesterday.  Pain is been persistent and constant.  Patient in triage mention shortness of breath but to me he denied that.  Patient was alert and oriented.  He was not in any respiratory distress.  Oxygen saturations have been fine.  Patient is followed by Dr. Claiborne Billings from cardiology.  Patient does have a history of chronic systolic congestive heart failure and nonischemic cardiomyopathy.  Patient had a cardiac cath in 2013 with nonobstructive coronary artery disease.  Patient has a history of PVCs.  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  Known to have some chronic kidney disease stage III.  Patient states that the pain is been constant little bit more to the right not associated with nausea or vomiting.  Does not radiate to the back.  Patient states that it has been 4 out of 10.  Patient denies any leg swelling.      Past Medical History:  Diagnosis Date  . CAD (coronary artery disease) 80% stenosis diag of the LAD, 30% in OM2 branch of LCX in 2009    a. Nonobstructive CAD by cath 11/2011 with the exception of the pre-existing diagonal branch #2 lesion.  . Chronic systolic CHF (congestive heart failure) ([place])   . CKD (chronic kidney disease) stage 3, GFR 30-59 ml/min (HCC)   . Colon polyp, hyperplastic   . History of stress test 06/01/2012   Normal myocardial perfusion study. compared to the previous study there is no significant change. this is a low risk scan  . Hypertension   . Legally blind    "both eyes"  . Myocardial infarction ([place]) 11/22/11  . NICM  (nonischemic cardiomyopathy) ([place])    a. Remote hx of dilated NICM with EF ranging 20-45%, including normal EF by echo (55-60%) in 2014.  Marland Kitchen Peripheral arterial disease ([place])    a. 06/2014: ABI right 0.99, left 1.2, LE dopplers revealing an occluded right posterior tibial. As symptoms were not felt r/t claudication, no further w/u at the time.  Marland Kitchen PVC's (premature ventricular contractions)   . 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 Requiring discontinuation of BB dose.  Marland Kitchen Spondylolisthesis   . Ventricular bigeminy   . 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 Unsteady gait 03/12/2016  . Nausea vomiting and diarrhea 03/11/2016  . UTI (lower urinary tract infection) 03/11/2016  . Gastroenteritis 03/11/2016  . Obstipation 03/11/2016  . Abdominal pain   . Constipation   . Lactic acidosis   . Leg pain   . Essential hypertension 03/04/2016  . 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 Chest pain with moderate risk for cardiac etiology 04/09/2015  . CAP (community acquired pneumonia) 02/18/2015  . Chest pain at rest 02/18/2015  . Peripheral arterial disease ([place]) 08/15/2014  . Hyperlipidemia with target LDL less than 70 01/01/2014  . GERD (gastroesophageal reflux disease) 01/01/2014  . First degree AV block 07/31/2012  . Unstable angina, negative MI 06/05/2012  . Acute on chronic  systolic CHF (congestive heart failure) ([place]) 06/05/2012  . 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 CKD (chronic kidney disease) stage 3, GFR 30-59 ml/min (HCC) 05/27/2012  . 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 Chronic systolic heart failure ([place]) 11/23/2011    Class: Diagnosis of  . CAD (coronary artery disease)  11/22/2011  . Premature ventricular contractions 11/22/2011  . Esophageal reflux 07/28/2011    Past Surgical  History:  Procedure Laterality Date  . BACK SURGERY    . CARDIAC CATHETERIZATION  11/2011  . CARDIAC CATHETERIZATION  11/2011   didn't demonstrate high grade obstructive disease to account for his LV dysfunction.  Marland Kitchen CATARACT EXTRACTION, BILATERAL  1990's  . CYSTOSCOPY    . CYSTOSCOPY WITH URETHRAL DILATATION N/A 05/04/2013   Procedure: CYSTOSCOPY WITH URETHRAL DILATATION;  Surgeon: Eustace Moore, MD;  Location: [facility];  Service: Neurosurgery;  Laterality: N/A;  with insertion of foley catheter  . EP study and ablation of VT  7/13   PVC focus mapped to the right coronary cusp of the aorta, limited ablation performed due to proximity of the focus to the right coronary artery  . EYE SURGERY    . LEFT HEART CATHETERIZATION WITH CORONARY ANGIOGRAM N/A 11/25/2011   Procedure: LEFT HEART CATHETERIZATION WITH CORONARY ANGIOGRAM;  Surgeon: Leonie Man, MD;  Location: [facility] CATH LAB;  Service: Cardiovascular;  Laterality: N/A;  . POSTERIOR FUSION [place]  . ROTATOR CUFF REPAIR  2000's   left  . V-TACH ABLATION N/A 06/06/2012   Procedure: V-TACH ABLATION;  Surgeon: Thompson Grayer, MD;  Location[facility] C[facility] LAB;  Service: Cardiovascular;  Laterality: N/A;       Home Medications    Prior to Admission medications   Medication Sig Start Date End Date Taking? Authorizing Provider  amLODipine (NORVASC) 10 MG tablet TAKE 1 TABLET(10 MG) BY MOUTH DAILY 05/23/17   Troy Sine, MD  aspirin 81 MG tablet Take 81 mg by mouth daily.    [provider]  famotidine (PEPCID) 20 MG tablet Take 1 tablet (20 mg total) by mouth 2 (two) times daily. 10/28/17   Fredia Sorrow, MD  fluticasone furoate-vilanterol (BREO ELLIPTA) 100-25 MCG/INH AEPB Inhale 1 puff into the lungs daily. (For 14 days)    [provider]  furosemide (LASIX) 20 MG tablet TAKE 1 TABLET(20 MG) BY MOUTH DAILY 02/11/17   Troy Sine, MD  HYDROcodone-acetaminophen (NORCO/VICODIN) 5-325 MG tablet Take 1 tablet by  mouth every 6 (six) hours as needed. 06/15/17   Volanda Napoleon, PA-C  isosorbide dinitrate (ISORDIL) 30 MG tablet Take 30 mg by mouth daily.    [provider]  LUMIGAN 0.01 % SOLN Place 1 drop into both eyes daily. 12/06/14   [provider]  nitroGLYCERIN (NITROSTAT) 0.4 MG SL tablet Place 1 tablet (0.4 mg total) under the tongue every 5 (five) minutes as needed for chest pain. 02/26/15   Troy Sine, MD  RESTASIS 0.05 % ophthalmic emulsion Place 1 drop into both eyes daily. 12/30/14   [provider]  spironolactone (ALDACTONE) 25 MG tablet Take 0.5 tablets (12.5 mg total) by mouth daily. 01/07/17   Troy Sine, MD  timolol (TIMOPTIC) 0.5 % ophthalmic solution INT 1 GTT IN OU D 06/07/17   [provider]    Family History Family History  Problem Relation Age of Onset  . Asthma Mother   .  Other Unknown        Unsure if any heart disease in his family.    Social History Social History   Tobacco Use  . Smoking status: Former Smoker    Years: 50.00    Types: Cigarettes    Last attempt to quit: 11/23/1999    Years since quitting: 17.9  . Smokeless tobacco: Never Used  Substance Use Topics  . 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 Drug use: No     Allergies   Patient has no known allergies.   Review of Systems Review of Systems  Constitutional: Negative for fever.  HENT: Negative for congestion.   Eyes: Positive for visual disturbance.  Respiratory: Negative for shortness of breath.   Cardiovascular: Positive for chest pain.  Gastrointestinal: Negative for abdominal pain, nausea and vomiting.  Genitourinary: Negative for dysuria.  Musculoskeletal: Negative for back pain.  Skin: Negative for rash.  Neurological: Negative for headaches.  Hematological: Does not bruise/bleed easily.  Psychiatric/Behavioral: Negative for confusion.     Physical Exam Updated Vital Signs BP  139/78   Pulse 63   Temp 98 F (36.7 C) (Oral)   Resp 14   SpO2 98%   Physical Exam  Constitutional: He is oriented to person, place, and time. He appears well-developed and well-nourished. No distress.  HENT:  Head: Normocephalic and atraumatic.  Mouth/Throat: Oropharynx is clear and moist.  Eyes: Conjunctivae and EOM are normal. Pupils are equal, round, and reactive to light.  Neck: Normal range of motion. Neck supple.  Cardiovascular: Normal rate.  Irregular  Pulmonary/Chest: Effort normal and breath sounds normal. No respiratory distress.  Abdominal: Soft. Bowel sounds are normal. There is no tenderness.  Musculoskeletal: Normal range of motion. He exhibits no edema.  Neurological: He is alert and oriented to person, place, and time. No cranial nerve deficit or sensory deficit. He exhibits normal muscle tone. Coordination normal.  Skin: Skin is warm.  Nursing note and vitals reviewed.    ED Treatments / Results  Labs (all labs ordered are listed, but only abnormal results are displayed) Labs Reviewed  BASIC METABOLIC PANEL - Abnormal; Notable for the following components:      Result Value   Glucose, Bld 137 (*)    Creatinine, Ser 1.60 (*)    GFR calc non Af Amer 40 (*)    GFR calc Af Amer 46 (*)    All other components within normal limits  CBC  I-STAT TROPONIN, ED  I-STAT TROPONIN, ED   Results for orders placed or performed during the hospital encounter of 17/61/60  Basic metabolic panel  Result Value Ref Range   Sodium 136 135 - 145 mmol/L   Potassium 3.8 3.5 - 5.1 mmol/L   Chloride 102 101 - 111 mmol/L   CO2 25 22 - 32 mmol/L   Glucose, Bld 137 (H) 65 - 99 mg/dL   BUN 15 6 - 20 mg/dL   Creatinine, Ser 1.60 (H) 0.61 - 1.24 mg/dL   Calcium 9.4 8.9 - 10.3 mg/dL   GFR calc non Af Amer 40 (L) >60 mL/min   GFR calc Af Amer 46 (L) >60 mL/min   Anion gap 9 5 - 15  CBC  Result Value Ref Range   WBC 6.9 4.0 - 10.5 K/uL   RBC 5.12 4.22 - 5.81 MIL/uL    Hemoglobin 15.8 13.0 - 17.0 g/dL   HCT 45.1 39.0 -  52.0 %   MCV 88.1 78.0 - 100.0 fL   MCH 30.9 26.0 - 34.0 pg   MCHC 35.0 30.0 - 36.0 g/dL   RDW 12.2 11.5 - 15.5 %   Platelets 167 150 - 400 K/uL  I-stat troponin, ED  Result Value Ref Range   Troponin i, poc 0.01 0.00 - 0.08 ng/mL   Comment 3          I-stat troponin, ED  Result Value Ref Range   Troponin i, poc 0.03 0.00 - 0.08 ng/mL   Comment 3            EKG  EKG Interpretation  Date/Time:  Friday October 28 2017 23:35:53 EST Ventricular Rate:  61 PR Interval:  328 QRS Duration: 125 QT Interval:  446 QTC Calculation: 450 R Axis:   -12 Text Interpretation:  Sinus or ectopic atrial rhythm Ventricular premature complex Prolonged PR interval Left bundle branch block PVC new Confirmed by Fredia Sorrow [id number]) on 10/28/2017 11:50:27 PM       Radiology Dg Chest 2 View  Result Date: 10/28/2017 CLINICAL DATA:  Chest pain.  Shortness breath. EXAM: CHEST  2 VIEW COMPARISON:  Chest x-ray 01/01/2017, 11/27/2015. FINDINGS: Mediastinum hilar structures are normal. Heart size normal. Stable chronic interstitial changes. No pleural effusion or pneumothorax. Prior thoracolumbar spine fusion. No acute bony abnormality. IMPRESSION: Stable chronic interstitial lung disease. No acute cardiopulmonary disease. Electronically Signed   By: Marcello Moores  Register   On: 10/28/2017 16:47    Procedures Procedures (including critical care time)  Medications Ordered in ED Medications - No data to display   Initial Impression / Assessment and Plan / ED Course  I have reviewed the triage vital signs and the nursing notes.  Pertinent labs & imaging results that were available during my care of the patient were reviewed by me and considered in my medical decision making (see chart for details).    Patient's workup for the chest pain without any acute findings.  Chest x-ray negative.  No hypoxia here.  Troponins x2-.  Since chest pain started greater  than 24 hours ago.  Started at 1700 yesterday.  If this was cardiac in nature would expect a significant increase in troponin.  Second troponin slightly higher than the first but it is still normal.  Patient without worsening pain is been consistent.  Substernal little bit more to the right.  No real epigastric pain or tenderness but will give a trial of Pepcid.  We will have him call cardiology for follow-up.  He will return for any new or worse symptoms. EKGs x2 first 1 had several PVCs.  Second 1 done most recently without any significant changes.  Occasional PVC.  Final Clinical Impressions(s) / ED Diagnoses   Final diagnoses:  Precordial pain    ED Discharge Orders        Ordered    famotidine (PEPCID) 20 MG tablet  2 times daily     10/28/17 2359       Fredia Sorrow, MD 10/29/17 0006

## 2017-10-28 NOTE — Discharge Instructions (Signed)
Trial of taking the Pepcid.  Return for any new or worse symptoms.  Call cardiology for follow-up on Monday.  Workup for the chest pain here this evening without any acute findings.  Would expect abnormalities after 24 hours of the chest pain.

## 2017-10-28 NOTE — ED Notes (Signed)
Did not answer when called to recheck vitals.

## 2017-10-28 NOTE — ED Triage Notes (Signed)
Pt reports central to right chest pain that began last night. Pt endorses sob as well. A/ox4, resp e/u.

## 2017-10-29 NOTE — ED Notes (Signed)
Patient Alert and oriented X4. Stable and ambulatory. Patient verbalized understanding of the discharge instructions.  Patient belongings were taken by the patient.  

## 2017-12-26 ENCOUNTER — Ambulatory Visit: Payer: Medicare Other | Admitting: Podiatry

## 2017-12-28 ENCOUNTER — Ambulatory Visit (INDEPENDENT_AMBULATORY_CARE_PROVIDER_SITE_OTHER): Payer: Medicare Other | Admitting: Podiatry

## 2017-12-28 ENCOUNTER — Encounter: Payer: Self-pay | Admitting: Podiatry

## 2017-12-28 DIAGNOSIS — M79674 Pain in right toe(s): Secondary | ICD-10-CM

## 2017-12-28 DIAGNOSIS — B351 Tinea unguium: Secondary | ICD-10-CM | POA: Diagnosis not present

## 2017-12-28 DIAGNOSIS — M79675 Pain in left toe(s): Secondary | ICD-10-CM

## 2017-12-28 NOTE — Progress Notes (Signed)
Subjective:   Patient ID: [name], male   DOB: 77 y.o.   MRN: [id number]   HPI Patient presents with elongated nailbeds 1-5 both feet that are thick yellow and he cannot take care of and are painful   ROS      Objective:  Physical Exam  Neurovascular status unchanged with thick yellow brittle nailbeds 1-5 both feet that are painful     Assessment:  Mycotic nail infection with pain 1-5 both feet     Plan:  Debride painful nailbeds 1-5 both feet with no iatrogenic bleeding noted

## 2018-01-16 ENCOUNTER — Telehealth: Payer: Self-pay | Admitting: Cardiovascular Disease

## 2018-01-16 MED ORDER — NITROGLYCERIN 0.4 MG SL SUBL: 0.4000 mg | SUBLINGUAL_TABLET | SUBLINGUAL | 5 refills | Status: DC | PRN

## 2018-01-16 NOTE — Telephone Encounter (Signed)
New Message    *STAT* If patient is at the pharmacy, call can be transferred to refill team.   1. Which medications need to be refilled? (please list name of each medication and dose if known) nitroGLYCERIN (NITROSTAT) 0.4 MG SL tablet  2. Which pharmacy/location (including street and city if local pharmacy) is medication to be sent to? [place]   3. Do they need a 30 day or 90 day supply? [place]

## 2018-01-23 ENCOUNTER — Emergency Department (HOSPITAL_COMMUNITY): Payer: Medicare Other

## 2018-01-23 ENCOUNTER — Encounter (HOSPITAL_COMMUNITY): Payer: Self-pay

## 2018-01-23 ENCOUNTER — Other Ambulatory Visit: Payer: Self-pay

## 2018-01-23 ENCOUNTER — Emergency Department (HOSPITAL_COMMUNITY)
Admission: EM | Admit: 2018-01-23 | Discharge: 2018-01-23 | Disposition: A | Discharge status: Home / Self Care | Payer: Medicare Other | Attending: Emergency Medicine | Admitting: Emergency Medicine

## 2018-01-23 DIAGNOSIS — J181 Lobar pneumonia, unspecified organism: Secondary | ICD-10-CM | POA: Diagnosis not present

## 2018-01-23 DIAGNOSIS — Z79899 Other long term (current) drug therapy: Secondary | ICD-10-CM | POA: Diagnosis not present

## 2018-01-23 DIAGNOSIS — I5022 Chronic systolic (congestive) heart failure: Secondary | ICD-10-CM | POA: Diagnosis not present

## 2018-01-23 DIAGNOSIS — R51 Headache: Secondary | ICD-10-CM | POA: Diagnosis not present

## 2018-01-23 DIAGNOSIS — Z7982 Long term (current) use of aspirin: Secondary | ICD-10-CM | POA: Insufficient documentation

## 2018-01-23 DIAGNOSIS — I130 Hypertensive heart and chronic kidney disease with heart failure and stage 1 through stage 4 chronic kidney disease, or unspecified chronic kidney disease: Secondary | ICD-10-CM | POA: Insufficient documentation

## 2018-01-23 DIAGNOSIS — Z87891 Personal history of nicotine dependence: Secondary | ICD-10-CM | POA: Insufficient documentation

## 2018-01-23 DIAGNOSIS — R05 Cough: Secondary | ICD-10-CM | POA: Diagnosis not present

## 2018-01-23 DIAGNOSIS — I2510 Atherosclerotic heart disease of native coronary artery without angina pectoris: Secondary | ICD-10-CM | POA: Diagnosis not present

## 2018-01-23 DIAGNOSIS — N183 Chronic kidney disease, stage 3 (moderate): Secondary | ICD-10-CM | POA: Insufficient documentation

## 2018-01-23 DIAGNOSIS — J189 Pneumonia, unspecified organism: Secondary | ICD-10-CM | POA: Diagnosis not present

## 2018-01-23 MED ORDER — DOXYCYCLINE HYCLATE 100 MG PO CAPS: 100.0000 mg | ORAL_CAPSULE | Freq: Two times a day (BID) | ORAL | 0 refills | Status: DC

## 2018-01-23 NOTE — ED Provider Notes (Signed)
[facility] EMERGENCY DEPARTMENT Provider Note   CSN: [id number] Arrival date & time: 01/23/18  1351     History   Chief Complaint No chief complaint on file.   HPI [name] is a 77 y.o. male.  77 yo M with a chief complaint of cough congestion and headache.  This been going on for the past 3 or 4 days.  Patient has a headache it is to the top of his head worse with coughing.  Denies neck pain denies fever.  Is unsure of sick contacts.  Denies vomiting or diarrhea.  Denies shortness of breath.   The history is provided by the patient.  Illness  This is a new problem. The current episode started yesterday. The problem occurs constantly. The problem has not changed since onset.Associated symptoms include headaches. Pertinent negatives include no chest pain, no abdominal pain and no shortness of breath. Nothing aggravates the symptoms. Nothing relieves the symptoms. He has tried nothing for the symptoms. The treatment provided no relief.    Past Medical History:  Diagnosis Date  . CAD (coronary artery disease) 80% stenosis diag of the LAD, 30% in OM2 branch of LCX in 2009    a. Nonobstructive CAD by cath 11/2011 with the exception of the pre-existing diagonal branch #2 lesion.  . Chronic systolic CHF (congestive heart failure) ([place])   . CKD (chronic kidney disease) stage 3, GFR 30-59 ml/min (HCC)   . Colon polyp, hyperplastic   . History of stress test 06/01/2012   Normal myocardial perfusion study. compared to the previous study there is no significant change. this is a low risk scan  . Hypertension   . Legally blind    "both eyes"  . Myocardial infarction ([place]) 11/22/11  . NICM (nonischemic cardiomyopathy) ([place])    a. Remote hx of dilated NICM with EF ranging 20-45%, including normal EF by echo (55-60%) in 2014.  Marland Kitchen Peripheral arterial disease ([place])    a. 06/2014: ABI right 0.99, left 1.2, LE dopplers revealing an occluded right posterior tibial. As  symptoms were not felt r/t claudication, no further w/u at the time.  Marland Kitchen PVC's (premature ventricular contractions)   . 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 Requiring discontinuation of BB dose.  Marland Kitchen Spondylolisthesis   . Ventricular bigeminy   . 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 Unsteady gait 03/12/2016  . Nausea vomiting and diarrhea 03/11/2016  . UTI (lower urinary tract infection) 03/11/2016  . Gastroenteritis 03/11/2016  . Obstipation 03/11/2016  . Abdominal pain   . Constipation   . Lactic acidosis   . Leg pain   . Essential hypertension 03/04/2016  . 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 Chest pain with moderate risk for cardiac etiology 04/09/2015  . CAP (community acquired pneumonia) 02/18/2015  . Chest pain at rest 02/18/2015  . Peripheral arterial disease ([place]) 08/15/2014  . Hyperlipidemia with target LDL less than 70 01/01/2014  . GERD (gastroesophageal reflux disease) 01/01/2014  . First degree AV block 07/31/2012  . Unstable angina, negative MI 06/05/2012  . Acute on chronic systolic CHF (congestive heart failure) ([place]) 06/05/2012  . 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 CKD (chronic kidney disease) stage 3, GFR 30-59 ml/min (HCC) 05/27/2012  . 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 Chronic systolic heart failure ([place]) 11/23/2011  Class: Diagnosis of  . CAD (coronary artery disease)  11/22/2011  . Premature ventricular contractions 11/22/2011  . Esophageal reflux 07/28/2011    Past Surgical History:  Procedure Laterality Date  . BACK SURGERY    . CARDIAC CATHETERIZATION  11/2011  . CARDIAC CATHETERIZATION  11/2011   didn't demonstrate high grade obstructive disease to account for his LV dysfunction.  Marland Kitchen CATARACT EXTRACTION, BILATERAL  1990's  . CYSTOSCOPY    .  CYSTOSCOPY WITH URETHRAL DILATATION N/A 05/04/2013   Procedure: CYSTOSCOPY WITH URETHRAL DILATATION;  Surgeon: Eustace Moore, MD;  Location: [facility];  Service: Neurosurgery;  Laterality: N/A;  with insertion of foley catheter  . EP study and ablation of VT  7/13   PVC focus mapped to the right coronary cusp of the aorta, limited ablation performed due to proximity of the focus to the right coronary artery  . EYE SURGERY    . LEFT HEART CATHETERIZATION WITH CORONARY ANGIOGRAM N/A 11/25/2011   Procedure: LEFT HEART CATHETERIZATION WITH CORONARY ANGIOGRAM;  Surgeon: Leonie Man, MD;  Location: [facility];  Service: Cardiovascular;  Laterality: N/A;  . POSTERIOR FUSION [place]  . ROTATOR CUFF REPAIR  2000's   left  . V-TACH ABLATION N/A 06/06/2012   Procedure: V-TACH ABLATION;  Surgeon: Thompson Grayer, MD;  Location: [facility] CATH LAB;  Service: Cardiovascular;  Laterality: N/A;       Home Medications    Prior to Admission medications   Medication Sig Start Date End Date Taking? Authorizing Provider  amLODipine (NORVASC) 10 MG tablet TAKE 1 TABLET(10 MG) BY MOUTH DAILY 05/23/17   Troy Sine, MD  aspirin 81 MG tablet Take 81 mg by mouth daily.    [provider]  doxycycline (VIBRAMYCIN) 100 MG capsule Take 1 capsule (100 mg total) by mouth 2 (two) times daily. 01/23/18   Deno Etienne, DO  famotidine (PEPCID) 20 MG tablet Take 1 tablet (20 mg total) by mouth 2 (two) times daily. 10/28/17   Fredia Sorrow, MD  fluticasone furoate-vilanterol (BREO ELLIPTA) 100-25 MCG/INH AEPB Inhale 1 puff into the lungs daily. (For 14 days)    [provider]  furosemide (LASIX) 20 MG tablet TAKE 1 TABLET(20 MG) BY MOUTH DAILY 02/11/17   Troy Sine, MD  HYDROcodone-acetaminophen (NORCO/VICODIN) 5-325 MG tablet Take 1 tablet by mouth every 6 (six) hours as needed. 06/15/17   Volanda Napoleon, PA-C  isosorbide dinitrate (ISORDIL) 30 MG tablet Take 30 mg by mouth daily.    [provider]  LUMIGAN 0.01 % SOLN Place 1 drop into both eyes daily. 12/06/14   [provider]  nitroGLYCERIN (NITROSTAT) 0.4 MG SL tablet Place 1 tablet (0.4 mg total) under the tongue every 5 (five) minutes as needed for chest pain. 01/16/18   Troy Sine, MD  RESTASIS 0.05 % ophthalmic emulsion Place 1 drop into both eyes daily. 12/30/14   [provider]  spironolactone (ALDACTONE) 25 MG tablet Take 0.5 tablets (12.5 mg total) by mouth daily. 01/07/17   Troy Sine, MD  timolol (TIMOPTIC) 0.5 % ophthalmic solution INT 1 GTT IN OU D 06/07/17   [provider]    Family History Family History  Problem Relation Age of Onset  . Asthma Mother   . Other Unknown        Unsure if any heart disease in his family.    Social History Social History   Tobacco Use  . Smoking status:  Former Smoker    Years: 50.00    Types: Cigarettes    Last attempt to quit: 11/23/1999    Years since quitting: 18.1  . Smokeless tobacco: Never Used  Substance Use Topics  . 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 Drug use: No     Allergies   Patient has no known allergies.   Review of Systems Review of Systems  Constitutional: Negative for chills and fever.  HENT: Positive for congestion. Negative for facial swelling.   Eyes: Negative for discharge and visual disturbance.  Respiratory: Positive for cough. Negative for shortness of breath.   Cardiovascular: Negative for chest pain and palpitations.  Gastrointestinal: Negative for abdominal pain, diarrhea and vomiting.  Musculoskeletal: Negative for arthralgias and myalgias.  Skin: Negative for color change and rash.  Neurological: Positive for headaches. Negative for tremors and syncope.  Psychiatric/Behavioral: Negative for confusion and dysphoric mood.     Physical Exam Updated Vital Signs BP (!) 142/99   Pulse 62   Temp 97.6 F (36.4 C) (Oral)   Resp  17   SpO2 100%   Physical Exam  Constitutional: He is oriented to person, place, and time. He appears well-developed and well-nourished.  HENT:  Head: Normocephalic and atraumatic.  Swollen turbinates, posterior nasal drip, no noted sinus ttp, tm normal bilaterally.    Eyes: EOM are normal. Pupils are equal, round, and reactive to light.  Neck: Normal range of motion. Neck supple. No JVD present.  Cardiovascular: Normal rate and regular rhythm. Exam reveals no gallop and no friction rub.  No murmur heard. Pulmonary/Chest: No respiratory distress. He has no wheezes.  Rhonchi to LUL  Abdominal: He exhibits no distension and no mass. There is no tenderness. There is no rebound and no guarding.  Musculoskeletal: Normal range of motion.  Neurological: He is alert and oriented to person, place, and time.  Skin: No rash noted. No pallor.  Psychiatric: He has a normal mood and affect. His behavior is normal.  Nursing note and vitals reviewed.    ED Treatments / Results  Labs (all labs ordered are listed, but only abnormal results are displayed) Labs Reviewed - No data to display  EKG  EKG Interpretation None       Radiology Dg Chest 2 View  Result Date: 01/23/2018 CLINICAL DATA:  Onset of productive cough 5 days ago. Also headaches and temporary fever. History of CHF, coronary artery disease and previous MI, former smoker. EXAM: CHEST  2 VIEW COMPARISON:  PA and lateral chest x-ray of October 28, 2017 FINDINGS: The lungs are well-expanded. There is subtle increased density in the left upper lobe superiorly and anteriorly. The interstitial markings are coarse but stable. There is no pleural effusion. The heart and pulmonary vascularity are normal. The mediastinum is normal in width. The trachea is midline. The patient has undergone previous lower thoracic and upper lumbar posterior fusion. IMPRESSION: Chronic bronchitic changes with superimposed subtle interstitial infiltrate in the left  upper lobe likely reflecting pneumonia. Followup PA and lateral chest X-ray is recommended in 3-4 weeks following trial of antibiotic therapy to ensure resolution and exclude underlying malignancy. Electronically Signed   By: David  Martinique M.D.   On: 01/23/2018 15:57    Procedures Procedures (including critical care time)  Medications Ordered in ED Medications - No data to display   Initial Impression / Assessment and Plan / ED Course  I have  reviewed the triage vital signs and the nursing notes.  Pertinent labs & imaging results that were available during my care of the patient were reviewed by me and considered in my medical decision making (see chart for details).     77 yo M with a chief complaint of cough and congestion.  This been going on for about a week.  Chest x-ray concerning for pneumonia.  The patient is well-appearing and nontoxic he was amatory without difficulty.  His O2 sat is 100% on room air.  Will start on antibiotics.  PCP follow-up.  11:44 PM:  I have discussed the diagnosis/risks/treatment options with the patient and believe the pt to be eligible for discharge home to follow-up with PCP. We also discussed returning to the ED immediately if new or worsening sx occur. We discussed the sx which are most concerning (e.g., sudden worsening pain, fever, inability to tolerate by mouth) that necessitate immediate return. Medications administered to the patient during their visit and any new prescriptions provided to the patient are listed below.  Medications given during this visit Medications - No data to display   The patient appears reasonably screen and/or stabilized for discharge and I doubt any other medical condition or other [facility] requiring further screening, evaluation, or treatment in the ED at this time prior to discharge.    Final Clinical Impressions(s) / ED Diagnoses   Final diagnoses:  Community acquired pneumonia of left upper lobe of lung [facility])    ED  Discharge Orders        Ordered    doxycycline (VIBRAMYCIN) 100 MG capsule  2 times daily     01/23/18 2018       Deno Etienne, DO 01/23/18 [id number]

## 2018-01-23 NOTE — ED Notes (Signed)
ED Provider at bedside. 

## 2018-01-23 NOTE — Discharge Instructions (Signed)
Return for worsening shortness of breath, chest pain, headache, or weakness

## 2018-01-23 NOTE — ED Triage Notes (Signed)
Patient complains of 3-4 days of body aches, chills, cough and fatigue. Not taking any otc meds, alert and oriented, NAD

## 2018-01-31 DIAGNOSIS — J129 Viral pneumonia, unspecified: Secondary | ICD-10-CM | POA: Diagnosis not present

## 2018-01-31 DIAGNOSIS — I10 Essential (primary) hypertension: Secondary | ICD-10-CM | POA: Diagnosis not present

## 2018-01-31 DIAGNOSIS — E785 Hyperlipidemia, unspecified: Secondary | ICD-10-CM | POA: Diagnosis not present

## 2018-01-31 DIAGNOSIS — N189 Chronic kidney disease, unspecified: Secondary | ICD-10-CM | POA: Diagnosis not present

## 2018-02-08 DIAGNOSIS — N189 Chronic kidney disease, unspecified: Secondary | ICD-10-CM | POA: Diagnosis not present

## 2018-02-08 DIAGNOSIS — I10 Essential (primary) hypertension: Secondary | ICD-10-CM | POA: Diagnosis not present

## 2018-02-08 DIAGNOSIS — M545 Low back pain: Secondary | ICD-10-CM | POA: Diagnosis not present

## 2018-02-08 DIAGNOSIS — J129 Viral pneumonia, unspecified: Secondary | ICD-10-CM | POA: Diagnosis not present

## 2018-02-16 DIAGNOSIS — H04123 Dry eye syndrome of bilateral lacrimal glands: Secondary | ICD-10-CM | POA: Diagnosis not present

## 2018-02-16 DIAGNOSIS — H401133 Primary open-angle glaucoma, bilateral, severe stage: Secondary | ICD-10-CM | POA: Diagnosis not present

## 2018-02-23 ENCOUNTER — Other Ambulatory Visit: Payer: Self-pay | Admitting: Cardiovascular Disease

## 2018-03-07 ENCOUNTER — Other Ambulatory Visit: Payer: Self-pay | Admitting: Cardiovascular Disease

## 2018-03-08 DIAGNOSIS — H42 Glaucoma in diseases classified elsewhere: Secondary | ICD-10-CM | POA: Diagnosis not present

## 2018-03-08 DIAGNOSIS — I110 Hypertensive heart disease with heart failure: Secondary | ICD-10-CM | POA: Diagnosis not present

## 2018-03-08 DIAGNOSIS — N189 Chronic kidney disease, unspecified: Secondary | ICD-10-CM | POA: Diagnosis not present

## 2018-03-15 ENCOUNTER — Other Ambulatory Visit: Payer: Self-pay | Admitting: Cardiovascular Disease

## 2018-03-15 NOTE — Telephone Encounter (Signed)
REFILL 

## 2018-03-27 ENCOUNTER — Encounter: Payer: Self-pay | Admitting: Podiatry

## 2018-03-27 ENCOUNTER — Ambulatory Visit (INDEPENDENT_AMBULATORY_CARE_PROVIDER_SITE_OTHER): Payer: Medicare Other | Admitting: Podiatry

## 2018-03-27 DIAGNOSIS — B351 Tinea unguium: Secondary | ICD-10-CM

## 2018-03-27 DIAGNOSIS — M79674 Pain in right toe(s): Secondary | ICD-10-CM | POA: Diagnosis not present

## 2018-03-27 DIAGNOSIS — M79675 Pain in left toe(s): Secondary | ICD-10-CM | POA: Diagnosis not present

## 2018-03-29 NOTE — Progress Notes (Signed)
Subjective:   Patient ID: [name], male   DOB: 77 y.o.   MRN: [id number]   HPI Patient presents with elongated nailbeds 1-5 both feet that are thick and impossible for him to cut and they get tender in shoes   ROS      Objective:  Physical Exam  Neurovascular status intact with thick yellow brittle nailbeds 1-5 both feet that are painful     Assessment:  Chronic mycotic nail infection with pain 1-5 both feet     Plan:  Debride painful nailbeds 1-5 both feet with no iatrogenic bleeding noted

## 2018-04-05 ENCOUNTER — Encounter: Payer: Self-pay | Admitting: Cardiovascular Disease

## 2018-04-06 ENCOUNTER — Other Ambulatory Visit: Payer: Self-pay | Admitting: Cardiovascular Disease

## 2018-04-07 ENCOUNTER — Ambulatory Visit (INDEPENDENT_AMBULATORY_CARE_PROVIDER_SITE_OTHER): Payer: Medicare Other | Admitting: Cardiovascular Disease

## 2018-04-07 ENCOUNTER — Encounter: Payer: Self-pay | Admitting: Cardiovascular Disease

## 2018-04-07 DIAGNOSIS — I2510 Atherosclerotic heart disease of native coronary artery without angina pectoris: Secondary | ICD-10-CM

## 2018-04-07 DIAGNOSIS — I493 Ventricular premature depolarization: Secondary | ICD-10-CM | POA: Diagnosis not present

## 2018-04-07 DIAGNOSIS — N183 Chronic kidney disease, stage 3 unspecified: Secondary | ICD-10-CM

## 2018-04-07 DIAGNOSIS — I2583 Coronary atherosclerosis due to lipid rich plaque: Secondary | ICD-10-CM

## 2018-04-07 DIAGNOSIS — Z79899 Other long term (current) drug therapy: Secondary | ICD-10-CM

## 2018-04-07 DIAGNOSIS — I428 Other cardiomyopathies: Secondary | ICD-10-CM

## 2018-04-07 DIAGNOSIS — I10 Essential (primary) hypertension: Secondary | ICD-10-CM | POA: Diagnosis not present

## 2018-04-07 DIAGNOSIS — E785 Hyperlipidemia, unspecified: Secondary | ICD-10-CM | POA: Diagnosis not present

## 2018-04-07 NOTE — Progress Notes (Signed)
Patient ID: [name], male   DOB: 09-14-1941, 77 y.o.   MRN: [id number]      Primary M.D.: Dr. Darlyne Russian  HPI: [name] is a 77 y.o. male who presents for a 12 month followup cardiology evaluation.  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 In  December 2012 he presented with acute congestive heart failure requiring BiPAP therapy and during his hospitalization ejection fraction dropped to 20-25%. He ruled in for non-ST segment elevation MI and was not found to have high-grade obstructive CAD. He did wear life-vest transiently and with improvement of his ejection fraction to approximately 35-40% in April 2013 his life as was discontinued. He also has a history of intermittent second-degree AV block lead leading to discontinuance of his beta blocker therapy and reduction of his amiodarone dose. An echo Doppler study in March 2014 revealed an ejection fraction that had increased to approximately 55%. 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  He was hospitalized in May 2016 with chest pain.  His cardiac enzymes were negative for MI.  He had an exercise Myoview study on 04/10/2015 and on the stress portion of the study he had episodes of nonsustained VT.  He did not have chest pain or diagnostic ECG changes and the result was felt to be low risk. During that hospitalization a follow-up echo Doppler study showed an ejection fraction that had improved to 45-50% with inferior hypokinesis.  There was mild LVH and grade 1 diastolic dysfunction.  During his hospitalization he did have some bradycardia.  He was evaluated in th ER on 01/01/17 for palpitaions and was felt to have an URI. He was febrile to 100.6.  Laboratory showed a Cr 1.74 which had increased from 1.37 10 months ago. He denies any chest pain.  He denies PND or orthopnea.  He is unaware of any recurrent palpitations.  He denies significant edema.    He has noticed very rare  twinges of atypical chest pain which is short-lived and nonexertional and resolve spontaneously.  He has been taking spironolactone 12.5 mg daily, furosemide 20 mg every other day, amlodipine 10 mg in addition to isosorbide 30 mg.  He denies any anginal type symptomatology.  His has not had any significant edema on this regimen.  Laboratory showed improvement in his creatinine at 1.37 from 1.74 in February 2018.  Lipid studies were excellent, not on therapy with a total cholesterol 111, triglycerides 42, HDL 50, and LDL 53.  He sees Dr. Criss Rosales at three-month intervals.    Since I last saw him in May 2018, he was evaluated in the emergency room in December 2018.  He had some chest discomfort.  There were no acute findings.  Chest x-ray was negative.  Troponins were negative 2.  Subsequently, he has felt well.  He denies any recurrent symptomatology.  He continues to be on amlodipine 10 mg, furosemide 20 g, isosorbide 30 mg daily in addition to spironolactone 12.5 mg daily.  He presents for re-evaluation.    Past Medical History:  Diagnosis Date  . CAD (coronary artery disease) 80% stenosis diag of the LAD, 30% in OM2 branch of LCX in 2009    a. Nonobstructive CAD by cath 11/2011 with the exception of the pre-existing diagonal branch #2 lesion.  . Chronic systolic CHF (congestive heart failure) ([place])   . CKD (chronic kidney disease) stage 3, GFR 30-59 ml/min (HCC)   . Colon  polyp, hyperplastic   . History of stress test 06/01/2012   Normal myocardial perfusion study. compared to the previous study there is no significant change. this is a low risk scan  . Hypertension   . Legally blind    "both eyes"  . Myocardial infarction ([place]) 11/22/11  . NICM (nonischemic cardiomyopathy) ([place])    a. Remote hx of dilated NICM with EF ranging 20-45%, including normal EF by echo (55-60%) in 2014.  Marland Kitchen Peripheral arterial disease ([place])    a. 06/2014: ABI right 0.99, left 1.2, LE dopplers revealing an occluded right  posterior tibial. As symptoms were not felt r/t claudication, no further w/u at the time.  Marland Kitchen PVC's (premature ventricular contractions)   . 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 Requiring discontinuation of BB dose.  Marland Kitchen Spondylolisthesis   . Ventricular bigeminy   . Ventricular tachycardia (paroxysmal) ([place]) 04/11/2015    Past Surgical History:  Procedure Laterality Date  . BACK SURGERY    . CARDIAC CATHETERIZATION  11/2011  . CARDIAC CATHETERIZATION  11/2011   didn't demonstrate high grade obstructive disease to account for his LV dysfunction.  Marland Kitchen CATARACT EXTRACTION, BILATERAL  1990's  . CYSTOSCOPY    . CYSTOSCOPY WITH URETHRAL DILATATION N/A 05/04/2013   Procedure: CYSTOSCOPY WITH URETHRAL DILATATION;  Surgeon: Eustace Moore, MD;  Location: [facility];  Service: Neurosurgery;  Laterality: N/A;  with insertion of foley catheter  . EP study and ablation of VT  7/13   PVC focus mapped to the right coronary cusp of the aorta, limited ablation performed due to proximity of the focus to the right coronary artery  . EYE SURGERY    . LEFT HEART CATHETERIZATION WITH CORONARY ANGIOGRAM N/A 11/25/2011   Procedure: LEFT HEART CATHETERIZATION WITH CORONARY ANGIOGRAM;  Surgeon: Leonie Man, MD;  Location: [facility] CATH LAB;  Service: Cardiovascular;  Laterality: N/A;  . POSTERIOR FUSION [place]  . ROTATOR CUFF REPAIR  2000's   left  . V-TACH ABLATION N/A 06/06/2012   Procedure: V-TACH ABLATION;  Surgeon: Thompson Grayer, MD;  Location: [facility] CATH LAB;  Service: Cardiovascular;  Laterality: N/A;    No Known Allergies  Current Outpatient Medications  Medication Sig Dispense Refill  . amLODipine (NORVASC) 10 MG tablet TAKE 1 TABLET(10 MG) BY MOUTH DAILY 30 tablet 0  . aspirin 81 MG tablet Take 81 mg by mouth daily.    . dorzolamide-timolol (COSOPT) 22.3-6.8 MG/ML ophthalmic solution INT 1 GTT IN OU BID  4  . fluticasone furoate-vilanterol (BREO ELLIPTA) 100-25 MCG/INH AEPB Inhale 1 puff  into the lungs daily. (For 14 days)    . furosemide (LASIX) 20 MG tablet TAKE 1 TABLET(20 MG) BY MOUTH DAILY (Patient taking differently: TAKE 1 TABLET(20 MG) BY MOUTH DAILY, pt takes one every other day) 30 tablet 2  . isosorbide mononitrate (IMDUR) 30 MG 24 hr tablet Take 1 tablet (30 mg total) by mouth daily. KEEP OV. 90 tablet 0  . LUMIGAN 0.01 % SOLN Place 1 drop into both eyes daily.  0  . nitroGLYCERIN (NITROSTAT) 0.4 MG SL tablet Place 1 tablet (0.4 mg total) under the tongue every 5 (five) minutes as needed for chest pain. 25 tablet 5  . RESTASIS 0.05 % ophthalmic emulsion Place 1 drop into both eyes daily.  11  . spironolactone (ALDACTONE) 25 MG tablet Take 0.5 tablets (12.5 mg total) by mouth daily. (Patient taking differently: Take 12.5 mg by mouth daily. ) 30  tablet 10  . doxycycline (VIBRAMYCIN) 100 MG capsule Take 1 capsule (100 mg total) by mouth 2 (two) times daily. (Patient not taking: Reported on 04/07/2018) 20 capsule 0  . famotidine (PEPCID) 20 MG tablet Take 1 tablet (20 mg total) by mouth 2 (two) times daily. (Patient not taking: Reported on 04/07/2018) 30 tablet 0  . HYDROcodone-acetaminophen (NORCO/VICODIN) 5-325 MG tablet Take 1 tablet by mouth every 6 (six) hours as needed. (Patient not taking: Reported on 04/07/2018) 8 tablet 0  . isosorbide dinitrate (ISORDIL) 30 MG tablet Take 30 mg by mouth daily.    . timolol (TIMOPTIC) 0.5 % ophthalmic solution INT 1 GTT IN OU D  4   No current facility-administered medications for this visit.     Social History   Socioeconomic History  . Marital status: Legally Separated    Spouse name: Not on file  . Number of children: 3  . Years of education: Not on file  . Highest education level: Not on file  Occupational History  . Occupation: Retired    Fish farm manager: RETIRED  Social Needs  . Financial resource strain: Not on file  . Food insecurity:    Worry: Not on file    Inability: Not on file  . Transportation needs:    Medical:  Not on file    Non-medical: Not on file  Tobacco Use  . Smoking status: Former Smoker    Years: 50.00    Types: Cigarettes    Last attempt to quit: 11/23/1999    Years since quitting: 18.3  . Smokeless tobacco: Never Used  Substance and Sexual Activity  . 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 Drug use: No  . Sexual activity: Yes  Lifestyle  . Physical activity:    Days per week: Not on file    Minutes per session: Not on file  . Stress: Not on file  Relationships  . Social connections:    Talks on phone: Not on file    Gets together: Not on file    Attends religious service: Not on file    Active member of club or organization: Not on file    Attends meetings of clubs or organizations: Not on file    Relationship status: Not on file  . Intimate partner violence:    Fear of current or ex partner: Not on file    Emotionally abused: Not on file    Physically abused: Not on file    Forced sexual activity: Not on file  Other Topics Concern  . Not on file  Social History Narrative  . Not on file    Socially, he is divorced and has 4 children, and 7 grandchildren.  ROS General: Negative; No fevers, chills, or night sweats;  HEENT: Negative; No changes in vision or hearing, sinus congestion, difficulty swallowing Pulmonary: Negative; No cough, wheezing, shortness of breath, hemoptysis Cardiovascular:  See history of present illness GI: He has a history of GERD, which is controlled with pantoprazole No nausea, vomiting, diarrhea, or abdominal pain GU: Negative; No dysuria, hematuria, or difficulty voiding Musculoskeletal: Negative; no myalgias, joint pain, or weakness Hematologic/Oncology: Negative; no easy bruising, bleeding Endocrine: Negative; no heat/cold intolerance; no diabetes Neuro: Negative; no changes in balance, headaches Skin: Negative; No rashes or skin lesions Psychiatric: Negative; No behavioral  problems, depression Sleep: Negative; No snoring, daytime sleepiness, hypersomnolence, bruxism, restless legs, hypnogognic hallucinations, no cataplexy Other comprehensive  14 point system review is negative.   PE BP 132/64 (BP Location: Right Arm)   Pulse (!) 53   Ht '5\' 8"'  (1.727 m)   Wt 173 lb 6.4 oz (78.7 kg)   BMI 26.37 kg/m    Repeat blood pressure by me was 130/66  Wt Readings from Last 3 Encounters:  04/07/18 173 lb 6.4 oz (78.7 kg)  10/11/17 170 lb 6 oz (77.3 kg)  06/15/17 166 lb (75.3 kg)     Physical Exam BP 132/64 (BP Location: Right Arm)   Pulse (!) 53   Ht '5\' 8"'  (1.727 m)   Wt 173 lb 6.4 oz (78.7 kg)   BMI 26.37 kg/m  General: Alert, oriented, no distress.  Skin: normal turgor, no rashes, warm and dry HEENT: Normocephalic, atraumatic. Pupils equal round and reactive to light; sclera anicteric; extraocular muscles intact;  Nose without nasal septal hypertrophy Mouth/Parynx benign; Mallinpatti scale 3 Neck: No JVD, no carotid bruits; normal carotid upstroke Lungs: clear to ausculatation and percussion; no wheezing or rales Chest wall: without tenderness to palpitation Heart: PMI not displaced, RRR, s1 s2 normal, 1/6 systolic murmur, no diastolic murmur, no rubs, gallops, thrills, or heaves Abdomen: soft, nontender; no hepatosplenomehaly, BS+; abdominal aorta nontender and not dilated by palpation. Back: no CVA tenderness Pulses 2+ Musculoskeletal: full range of motion, normal strength, no joint deformities Extremities: no clubbing cyanosis or edema, Homan's sign negative  Neurologic: grossly nonfocal; Cranial nerves grossly wnl Psychologic: Normal mood and affect   ECG (independently read by me): Sinus bradycardia with first-degree AV block with a PR interval 280 ms.  PVCs with ventricular bigeminal rhythm.  May 2018 ECG (independently read by me): Low atrial rhythm with negative P waves inferiorly.  Heart rate 54 bpm.  Left axis deviation.  First-degree AV  block with a PR interval of 318 which is slightly decreased from previously. No significant ST-T changes.    February 2018 ECG (independently read by me): Probable low atrial rhythm with ventricular rate of 54 bpm.  First-degree AV block with a PR interval 332 ms.  Nonspecific ST-T changes.  July 2017 ECG (independently read by me): Sinus rhythm.  She 1 bpm without ectopy.  First-degree AV block.  Nonspecific ST-T changes.  January 2017 ECG (independently read by me):  Normal sinus rhythm at 62 bpm. First-degree AV block. Isolated PVC.  August 2015 ECG (independently read by me): Sinus rhythm at 61 beats per minute with a mild sinus arrhythmia and first-degree AV block.  Nonspecific ST change  Prior 01/01/2014 ECG (independently read by me): Sinus bradycardia with marked first-degree AV block with PR interval at 348 ms. QTc interval 457 ms. No significant ST-T changes.  Prior ECG from 06/27/2013: Normal sinus rhythm at 64 beats per minute with first-degree AV block. Voltaren 6 ms. QTC interval 464 ms.  LABS: BMP Latest Ref Rng & Units 04/07/2018 10/28/2017 01/24/2017  Glucose 65 - 99 mg/dL 89 137(H) 95  BUN 8 - 27 mg/dL '16 15 12  ' Creatinine 0.76 - 1.27 mg/dL 1.60(H) 1.60(H) 1.37(H)  BUN/Creat Ratio 10 - 24 10 - -  Sodium 134 - 144 mmol/L 139 136 140  Potassium 3.5 - 5.2 mmol/L 4.2 3.8 4.2  Chloride 96 - 106 mmol/L 104 102 106  CO2 20 - 29 mmol/L '21 25 27  ' Calcium 8.6 - 10.2 mg/dL 9.7 9.4 9.5   Hepatic Function Latest Ref Rng & Units 04/07/2018 01/24/2017 03/11/2016  Total Protein 6.0 - 8.5 g/dL 7.1 7.3 7.6  Albumin 3.5 - 4.8 g/dL 4.1 3.9 3.6  AST 0 - 40 IU/L 17 15 52(H)  ALT 0 - 44 IU/L 16 10 44  Alk Phosphatase 39 - 117 IU/L 73 74 60  Total Bilirubin 0.0 - 1.2 mg/dL 0.6 0.8 1.8(H)  Bilirubin, Direct 0.0 - 0.3 mg/dL - - -   CBC Latest Ref Rng & Units 10/28/2017 01/24/2017 01/01/2017  WBC 4.0 - 10.5 K/uL 6.9 3.9 5.1  Hemoglobin 13.0 - 17.0 g/dL 15.8 14.0 14.3  Hematocrit 39.0 - 52.0 % 45.1  42.4 41.7  Platelets 150 - 400 K/uL 167 172 185   Lab Results  Component Value Date   MCV 88.1 10/28/2017   MCV 89.6 01/24/2017   MCV 88.7 01/01/2017   Lab Results  Component Value Date   TSH 1.270 04/07/2018   Lab Results  Component Value Date   HGBA1C 5.9 (H) 05/26/2012   Lipid Panel     Component Value Date/Time   CHOL 111 01/24/2017 0950   TRIG 42 01/24/2017 0950   HDL 50 01/24/2017 0950   CHOLHDL 2.2 01/24/2017 0950   VLDL 8 01/24/2017 0950   LDLCALC 53 01/24/2017 0950     RADIOLOGY: No results found.  IMPRESSION:  1. Nonischemic cardiomyopathy ([place])   2. Coronary artery disease due to lipid rich plaque   3. Ventricular ectopy: Bigeminal rhythm   4. Essential hypertension   5. Medication management   6. CKD (chronic kidney disease) stage 3, GFR 30-59 ml/min (HCC)     ASSESSMENT AND PLAN: Mr. Kruszka Is a 77 year old African-American male who has a history of a nonischemic cardiomyopathy with a remote ejection fraction  at 25%. His LV function has normalized on current medical regimen and on his  echo Doppler assessment of May 2016 was 45 - 50%. Presently, he is not having any signs of CHF or volume overload.  His blood pressure is stable on amlodipine 10 mg, lactone 12.5 mg, isosorbide 30 mg, in addition to furosemide 20 mg daily.  He is not having any volume overload.  When I saw him one year ago, he had a low atrial rhythm.  His ECG today shows sinus rhythm with PVCs in a bigeminal rhythm.  He is completely asymptomatic with reference to this.  He's not having any wheezing.  He continues to be on [employer].  I am recommending repeat laboratory including a chemistry profile, magnesium and TSH level.  I reviewed additional recent laboratory from March 2019.  Lipid studies were excellent with an LDL cholesterol at 56.  Is not anemic.  He has mild renal insufficiency and creatinine in December 2018 was 1.6.  I scheduled him for an echo Doppler study.  I'll see him  back in the office in 6 weeks for reevaluation.  Time spent: 25 minutes  Troy Sine, MD, [facility]  04/09/2018 7:05 PM

## 2018-04-07 NOTE — Patient Instructions (Signed)
Medication Instructions:  Your physician recommends that you continue on your current medications as directed. Please refer to the Current Medication list given to you today.  Labwork: TODAY (CMET, Mag, TSH)  Testing/Procedures: Your physician has requested that you have an echocardiogram. Echocardiography is a painless test that uses sound waves to create images of your heart. It provides your doctor with information about the size and shape of your heart and how well your heart's chambers and valves are working. This procedure takes approximately one hour. There are no restrictions for this procedure.  This will be done at our [facility] location:  [address]  Follow-Up: 6-8 weeks with Dr. Claiborne Billings  Any Other Special Instructions Will Be Listed Below (If Applicable).     If you need a refill on your cardiac medications before your next appointment, please call your pharmacy.

## 2018-04-08 LAB — COMPREHENSIVE METABOLIC PANEL
A/G RATIO: 1.4 (ref 1.2–2.2)
ALBUMIN: 4.1 g/dL (ref 3.5–4.8)
ALT: 16 IU/L (ref 0–44)
AST: 17 IU/L (ref 0–40)
Alkaline Phosphatase: 73 IU/L (ref 39–117)
BUN / CREAT RATIO: 10 (ref 10–24)
BUN: 16 mg/dL (ref 8–27)
Bilirubin Total: 0.6 mg/dL (ref 0.0–1.2)
CALCIUM: 9.7 mg/dL (ref 8.6–10.2)
CO2: 21 mmol/L (ref 20–29)
Chloride: 104 mmol/L (ref 96–106)
Creatinine, Ser: 1.6 mg/dL — ABNORMAL HIGH (ref 0.76–1.27)
GFR, EST AFRICAN AMERICAN: 47 mL/min/{1.73_m2} — AB (ref >59)
GFR, EST NON AFRICAN AMERICAN: 40 mL/min/{1.73_m2} — AB (ref >59)
GLOBULIN, TOTAL: 3 g/dL (ref 1.5–4.5)
Glucose: 89 mg/dL (ref 65–99)
POTASSIUM: 4.2 mmol/L (ref 3.5–5.2)
SODIUM: 139 mmol/L (ref 134–144)
TOTAL PROTEIN: 7.1 g/dL (ref 6.0–8.5)

## 2018-04-08 LAB — TSH: TSH: 1.27 u[IU]/mL (ref 0.450–4.500)

## 2018-04-08 LAB — MAGNESIUM: Magnesium: 1.9 mg/dL (ref 1.6–2.3)

## 2018-04-09 ENCOUNTER — Encounter: Payer: Self-pay | Admitting: Cardiovascular Disease

## 2018-04-10 ENCOUNTER — Other Ambulatory Visit (INDEPENDENT_AMBULATORY_CARE_PROVIDER_SITE_OTHER): Payer: Medicare Other

## 2018-04-10 DIAGNOSIS — I5023 Acute on chronic systolic (congestive) heart failure: Secondary | ICD-10-CM

## 2018-04-10 DIAGNOSIS — I200 Unstable angina: Secondary | ICD-10-CM | POA: Diagnosis not present

## 2018-04-13 ENCOUNTER — Other Ambulatory Visit: Payer: Self-pay

## 2018-04-13 ENCOUNTER — Ambulatory Visit (HOSPITAL_COMMUNITY): Payer: Medicare Other | Attending: Cardiology

## 2018-04-13 DIAGNOSIS — I2510 Atherosclerotic heart disease of native coronary artery without angina pectoris: Secondary | ICD-10-CM

## 2018-04-13 DIAGNOSIS — I472 Ventricular tachycardia: Secondary | ICD-10-CM | POA: Diagnosis not present

## 2018-04-13 DIAGNOSIS — Z87891 Personal history of nicotine dependence: Secondary | ICD-10-CM | POA: Diagnosis not present

## 2018-04-13 DIAGNOSIS — I10 Essential (primary) hypertension: Secondary | ICD-10-CM

## 2018-04-13 DIAGNOSIS — E785 Hyperlipidemia, unspecified: Secondary | ICD-10-CM | POA: Insufficient documentation

## 2018-04-13 DIAGNOSIS — I440 Atrioventricular block, first degree: Secondary | ICD-10-CM | POA: Insufficient documentation

## 2018-04-13 DIAGNOSIS — I2583 Coronary atherosclerosis due to lipid rich plaque: Secondary | ICD-10-CM | POA: Diagnosis not present

## 2018-04-13 DIAGNOSIS — N183 Chronic kidney disease, stage 3 (moderate): Secondary | ICD-10-CM | POA: Diagnosis not present

## 2018-04-13 DIAGNOSIS — I493 Ventricular premature depolarization: Secondary | ICD-10-CM | POA: Insufficient documentation

## 2018-04-13 DIAGNOSIS — I071 Rheumatic tricuspid insufficiency: Secondary | ICD-10-CM | POA: Diagnosis not present

## 2018-04-13 DIAGNOSIS — I130 Hypertensive heart and chronic kidney disease with heart failure and stage 1 through stage 4 chronic kidney disease, or unspecified chronic kidney disease: Secondary | ICD-10-CM | POA: Diagnosis not present

## 2018-04-13 DIAGNOSIS — I252 Old myocardial infarction: Secondary | ICD-10-CM | POA: Diagnosis not present

## 2018-04-13 DIAGNOSIS — I272 Pulmonary hypertension, unspecified: Secondary | ICD-10-CM | POA: Insufficient documentation

## 2018-04-13 DIAGNOSIS — I509 Heart failure, unspecified: Secondary | ICD-10-CM | POA: Diagnosis not present

## 2018-04-21 DIAGNOSIS — N189 Chronic kidney disease, unspecified: Secondary | ICD-10-CM | POA: Diagnosis not present

## 2018-04-21 DIAGNOSIS — I10 Essential (primary) hypertension: Secondary | ICD-10-CM | POA: Diagnosis not present

## 2018-04-21 DIAGNOSIS — E785 Hyperlipidemia, unspecified: Secondary | ICD-10-CM | POA: Diagnosis not present

## 2018-05-08 ENCOUNTER — Other Ambulatory Visit: Payer: Self-pay

## 2018-05-08 MED ORDER — AMLODIPINE BESYLATE 10 MG PO TABS: 10.0000 mg | ORAL_TABLET | Freq: Every day | ORAL | 3 refills | Status: DC

## 2018-05-08 NOTE — Telephone Encounter (Signed)
Rx(s) sent to pharmacy electronically.  

## 2018-05-09 DIAGNOSIS — H401133 Primary open-angle glaucoma, bilateral, severe stage: Secondary | ICD-10-CM | POA: Diagnosis not present

## 2018-05-22 ENCOUNTER — Encounter: Payer: Self-pay | Admitting: Cardiovascular Disease

## 2018-05-22 ENCOUNTER — Ambulatory Visit (INDEPENDENT_AMBULATORY_CARE_PROVIDER_SITE_OTHER): Payer: Medicare Other | Admitting: Cardiovascular Disease

## 2018-05-22 VITALS — BP 138/64 | HR 33 | Ht 68.0 in | Wt 172.8 lb

## 2018-05-22 DIAGNOSIS — N183 Chronic kidney disease, stage 3 unspecified: Secondary | ICD-10-CM

## 2018-05-22 DIAGNOSIS — I495 Sick sinus syndrome: Secondary | ICD-10-CM | POA: Diagnosis not present

## 2018-05-22 DIAGNOSIS — I428 Other cardiomyopathies: Secondary | ICD-10-CM | POA: Diagnosis not present

## 2018-05-22 DIAGNOSIS — E785 Hyperlipidemia, unspecified: Secondary | ICD-10-CM | POA: Diagnosis not present

## 2018-05-22 DIAGNOSIS — I10 Essential (primary) hypertension: Secondary | ICD-10-CM | POA: Diagnosis not present

## 2018-05-22 DIAGNOSIS — I493 Ventricular premature depolarization: Secondary | ICD-10-CM

## 2018-05-22 NOTE — Progress Notes (Signed)
Patient ID: [name], male   DOB: 1941-03-21, 77 y.o.   MRN: [id number]      Primary M.D.: Dr. Darlyne Russian  HPI: [name] is a 77 y.o. male who presents for a one month followup cardiology evaluation.  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 In  December 2012 he presented with acute congestive heart failure requiring BiPAP therapy and during his hospitalization ejection fraction dropped to 20-25%. He ruled in for non-ST segment elevation MI and was not found to have high-grade obstructive CAD. He did wear life-vest transiently and with improvement of his ejection fraction to approximately 35-40% in April 2013 his life as was discontinued. He also has a history of intermittent second-degree AV block lead leading to discontinuance of his beta blocker therapy and reduction of his amiodarone dose. An echo Doppler study in March 2014 revealed an ejection fraction that had increased to approximately 55%. 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  He was hospitalized in May 2016 with chest pain.  His cardiac enzymes were negative for MI.  He had an exercise Myoview study on 04/10/2015 and on the stress portion of the study he had episodes of nonsustained VT.  He did not have chest pain or diagnostic ECG changes and the result was felt to be low risk. During that hospitalization a follow-up echo Doppler study showed an ejection fraction that had improved to 45-50% with inferior hypokinesis.  There was mild LVH and grade 1 diastolic dysfunction.  During his hospitalization he did have some bradycardia.  He was evaluated in th ER on 01/01/17 for palpitaions and was felt to have an URI. He was febrile to 100.6.  Laboratory showed a Cr 1.74 which had increased from 1.37 10 months ago. He denies any chest pain.  He denies PND or orthopnea.  He is unaware of any recurrent palpitations.  He denies significant edema.    He has noticed very rare  twinges of atypical chest pain which is short-lived and nonexertional and resolve spontaneously.  He has been taking spironolactone 12.5 mg daily, furosemide 20 mg every other day, amlodipine 10 mg in addition to isosorbide 30 mg.  He denies any anginal type symptomatology.  His has not had any significant edema on this regimen.  Laboratory showed improvement in his creatinine at 1.37 from 1.74 in February 2018.  Lipid studies were excellent, not on therapy with a total cholesterol 111, triglycerides 42, HDL 50, and LDL 53.  He sees Dr. Criss Rosales at three-month intervals.    He was evaluated in the emergency room in December 2018.  He had some chest discomfort.  There were no acute findings.  Chest x-ray was negative.  Troponins were negative 2.  Subsequently, he has felt well.    I last saw him on Apr 07, 2018.  At that time, he remained asymptomatic with stable blood pressure without chest pain on  amlodipine 10 mg, furosemide 20 g, isosorbide 30 mg daily in addition to spironolactone 12.5 mg daily.  His ECG at that time showed sinus rhythm with PVCs in a bigeminal rhythm for which he was asymptomatic.  He underwent an echo Doppler study on Apr 13, 2018 which showed normal LV function without wall motion abnormality, grade 2 diastolic dysfunction, mild biatrial enlargement, and mild pulmonary hypertension with a peak PA pressure 39 mm.  Over the past month he continues to be completely asymptomatic.  He specifically  denies any dizziness, presyncope or syncope.  He is unaware of any palpitations.  Laboratory had shown normal TSH of 1.27.  He has chronic kidney disease stage III with creatinine 1.6.  Magnesium level was normal.  He presents for evaluation.  Past Medical History:  Diagnosis Date  . CAD (coronary artery disease) 80% stenosis diag of the LAD, 30% in OM2 branch of LCX in 2009    a. Nonobstructive CAD by cath 11/2011 with the exception of the pre-existing diagonal branch #2 lesion.  . Chronic  systolic CHF (congestive heart failure) ([place])   . CKD (chronic kidney disease) stage 3, GFR 30-59 ml/min (HCC)   . Colon polyp, hyperplastic   . History of stress test 06/01/2012   Normal myocardial perfusion study. compared to the previous study there is no significant change. this is a low risk scan  . Hypertension   . Legally blind    "both eyes"  . Myocardial infarction ([place]) 11/22/11  . NICM (nonischemic cardiomyopathy) ([place])    a. Remote hx of dilated NICM with EF ranging 20-45%, including normal EF by echo (55-60%) in 2014.  Marland Kitchen Peripheral arterial disease ([place])    a. 06/2014: ABI right 0.99, left 1.2, LE dopplers revealing an occluded right posterior tibial. As symptoms were not felt r/t claudication, no further w/u at the time.  Marland Kitchen PVC's (premature ventricular contractions)   . 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 Requiring discontinuation of BB dose.  Marland Kitchen Spondylolisthesis   . Ventricular bigeminy   . Ventricular tachycardia (paroxysmal) ([place]) 04/11/2015    Past Surgical History:  Procedure Laterality Date  . BACK SURGERY    . CARDIAC CATHETERIZATION  11/2011  . CARDIAC CATHETERIZATION  11/2011   didn't demonstrate high grade obstructive disease to account for his LV dysfunction.  Marland Kitchen CATARACT EXTRACTION, BILATERAL  1990's  . CYSTOSCOPY    . CYSTOSCOPY WITH URETHRAL DILATATION N/A 05/04/2013   Procedure: CYSTOSCOPY WITH URETHRAL DILATATION;  Surgeon: Eustace Moore, MD;  Location: [facility];  Service: Neurosurgery;  Laterality: N/A;  with insertion of foley catheter  . EP study and ablation of VT  7/13   PVC focus mapped to the right coronary cusp of the aorta, limited ablation performed due to proximity of the focus to the right coronary artery  . EYE SURGERY    . LEFT HEART CATHETERIZATION WITH CORONARY ANGIOGRAM N/A 11/25/2011   Procedure: LEFT HEART CATHETERIZATION WITH CORONARY ANGIOGRAM;  Surgeon: Leonie Man, MD;  Location: [facility] CATH LAB;  Service: Cardiovascular;   Laterality: N/A;  . POSTERIOR FUSION [place]  . ROTATOR CUFF REPAIR  2000's   left  . V-TACH ABLATION N/A 06/06/2012   Procedure: V-TACH ABLATION;  Surgeon: Thompson Grayer, MD;  Location: [facility] CATH LAB;  Service: Cardiovascular;  Laterality: N/A;    No Known Allergies  Current Outpatient Medications  Medication Sig Dispense Refill  . amLODipine (NORVASC) 10 MG tablet Take 1 tablet (10 mg total) by mouth daily. 90 tablet 3  . aspirin 81 MG tablet Take 81 mg by mouth daily.    . dorzolamide-timolol (COSOPT) 22.3-6.8 MG/ML ophthalmic solution INT 1 GTT IN OU BID  4  . fluticasone furoate-vilanterol (BREO ELLIPTA) 100-25 MCG/INH AEPB Inhale 1 puff into the lungs daily. (For 14 days)    . furosemide (LASIX) 20 MG tablet TAKE 1 TABLET(20 MG) BY MOUTH DAILY (Patient taking differently: TAKE 1 TABLET(20 MG) BY MOUTH DAILY, pt takes one every  other day) 30 tablet 2  . isosorbide dinitrate (ISORDIL) 30 MG tablet Take 30 mg by mouth daily.    . isosorbide mononitrate (IMDUR) 30 MG 24 hr tablet Take 1 tablet (30 mg total) by mouth daily. KEEP OV. 90 tablet 0  . LUMIGAN 0.01 % SOLN Place 1 drop into both eyes daily.  0  . nitroGLYCERIN (NITROSTAT) 0.4 MG SL tablet Place 1 tablet (0.4 mg total) under the tongue every 5 (five) minutes as needed for chest pain. 25 tablet 5  . RESTASIS 0.05 % ophthalmic emulsion Place 1 drop into both eyes daily.  11  . spironolactone (ALDACTONE) 25 MG tablet Take 0.5 tablets (12.5 mg total) by mouth daily. (Patient taking differently: Take 12.5 mg by mouth daily. ) 30 tablet 10  . timolol (TIMOPTIC) 0.5 % ophthalmic solution INT 1 GTT IN OU D  4   No current facility-administered medications for this visit.     Social History   Socioeconomic History  . Marital status: Legally Separated    Spouse name: Not on file  . Number of children: 3  . Years of education: Not on file  . Highest education level: Not on file  Occupational History  . Occupation: Retired      Fish farm manager: RETIRED  Social Needs  . Financial resource strain: Not on file  . Food insecurity:    Worry: Not on file    Inability: Not on file  . Transportation needs:    Medical: Not on file    Non-medical: Not on file  Tobacco Use  . Smoking status: Former Smoker    Years: 50.00    Types: Cigarettes    Last attempt to quit: 11/23/1999    Years since quitting: 18.5  . Smokeless tobacco: Never Used  Substance and Sexual Activity  . 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 Drug use: No  . Sexual activity: Yes  Lifestyle  . Physical activity:    Days per week: Not on file    Minutes per session: Not on file  . Stress: Not on file  Relationships  . Social connections:    Talks on phone: Not on file    Gets together: Not on file    Attends religious service: Not on file    Active member of club or organization: Not on file    Attends meetings of clubs or organizations: Not on file    Relationship status: Not on file  . Intimate partner violence:    Fear of current or ex partner: Not on file    Emotionally abused: Not on file    Physically abused: Not on file    Forced sexual activity: Not on file  Other Topics Concern  . Not on file  Social History Narrative  . Not on file    Socially, he is divorced and has 4 children, and 7 grandchildren.  ROS General: Negative; No fevers, chills, or night sweats;  HEENT: Negative; No changes in vision or hearing, sinus congestion, difficulty swallowing Pulmonary: Negative; No cough, wheezing, shortness of breath, hemoptysis Cardiovascular:  See history of present illness GI: He has a history of GERD, which is controlled with pantoprazole No nausea, vomiting, diarrhea, or abdominal pain GU: Negative; No dysuria, hematuria, or difficulty voiding Musculoskeletal: Negative; no myalgias, joint pain, or weakness Hematologic/Oncology: Negative; no easy bruising,  bleeding Endocrine: Negative; no heat/cold intolerance; no diabetes Neuro: Negative;  no changes in balance, headaches Skin: Negative; No rashes or skin lesions Psychiatric: Negative; No behavioral problems, depression Sleep: Negative; No snoring, daytime sleepiness, hypersomnolence, bruxism, restless legs, hypnogognic hallucinations, no cataplexy Other comprehensive 14 point system review is negative.   PE BP 138/64   Pulse (!) 33   Ht _0  (1.727 m)   Wt 172 lb 12.8 oz (78.4 kg)   BMI 26.27 kg/m    Repeat blood pressure by me was 130/64  Wt Readings from Last 3 Encounters:  05/22/18 172 lb 12.8 oz (78.4 kg)  04/07/18 173 lb 6.4 oz (78.7 kg)  10/11/17 170 lb 6 oz (77.3 kg)   General: Alert, oriented, no distress.  Skin: normal turgor, no rashes, warm and dry HEENT: Normocephalic, atraumatic. Pupils equal round and reactive to light; sclera anicteric; extraocular muscles intact; Nose without nasal septal hypertrophy Mouth/Parynx benign; Mallinpatti scale 3 Neck: No JVD, no carotid bruits; normal carotid upstroke Lungs: clear to ausculatation and percussion; no wheezing or rales Chest wall: without tenderness to palpitation Heart: PMI not displaced, bradycardic, s1 s2 normal, 1/6 systolic murmur, no diastolic murmur, no rubs, gallops, thrills, or heaves Abdomen: soft, nontender; no hepatosplenomehaly, BS+; abdominal aorta nontender and not dilated by palpation. Back: no CVA tenderness Pulses 2+ Musculoskeletal: full range of motion, normal strength, no joint deformities Extremities: no clubbing cyanosis or edema, Homan's sign negative  Neurologic: grossly nonfocal; Cranial nerves grossly wnl Psychologic: Normal mood and affect   ECG (independently read by me): Marked bradycardia at 33 bpm with  low atrial rhythm with second-degree block which at times may be Mobitz 1 and and junctional rhythm; nonspecific ST changes.  PVC.  04/10/2018 ECG (independently read by me): Sinus  bradycardia with first-degree AV block with a PR interval 280 ms.  PVCs with ventricular bigeminal rhythm.  May 2018 ECG (independently read by me): Low atrial rhythm with negative P waves inferiorly.  Heart rate 54 bpm.  Left axis deviation.  First-degree AV block with a PR interval of 318 which is slightly decreased from previously. No significant ST-T changes.    February 2018 ECG (independently read by me): Probable low atrial rhythm with ventricular rate of 54 bpm.  First-degree AV block with a PR interval 332 ms.  Nonspecific ST-T changes.  July 2017 ECG (independently read by me): Sinus rhythm.  She 1 bpm without ectopy.  First-degree AV block.  Nonspecific ST-T changes.  January 2017 ECG (independently read by me):  Normal sinus rhythm at 62 bpm. First-degree AV block. Isolated PVC.  August 2015 ECG (independently read by me): Sinus rhythm at 61 beats per minute with a mild sinus arrhythmia and first-degree AV block.  Nonspecific ST change  Prior 01/01/2014 ECG (independently read by me): Sinus bradycardia with marked first-degree AV block with PR interval at 348 ms. QTc interval 457 ms. No significant ST-T changes.  Prior ECG from 06/27/2013: Normal sinus rhythm at 64 beats per minute with first-degree AV block. Voltaren 6 ms. QTC interval 464 ms.  LABS: BMP Latest Ref Rng & Units 04/07/2018 10/28/2017 01/24/2017  Glucose 65 - 99 mg/dL 89 137(H) 95  BUN 8 - 27 mg/dL _1 Creatinine 0.76 - 1.27 mg/dL 1.60(H) 1.60(H) 1.37(H)  BUN/Creat Ratio 10 - 24 10 - -  Sodium 134 - 144 mmol/L 139 136 140  Potassium 3.5 - 5.2 mmol/L 4.2 3.8 4.2  Chloride 96 - 106 mmol/L 104 102 106  CO2 20 - 29 mmol/L 21 25 27  Calcium 8.6 - 10.2 mg/dL 9.7 9.4 9.5   Hepatic Function Latest Ref Rng & Units 04/07/2018 01/24/2017 03/11/2016  Total Protein 6.0 - 8.5 g/dL 7.1 7.3 7.6  Albumin 3.5 - 4.8 g/dL 4.1 3.9 3.6  AST 0 - 40 IU/L 17 15 52(H)  ALT 0 - 44 IU/L 16 10 44  Alk Phosphatase 39 - 117 IU/L 73 74 60   Total Bilirubin 0.0 - 1.2 mg/dL 0.6 0.8 1.8(H)  Bilirubin, Direct 0.0 - 0.3 mg/dL - - -   CBC Latest Ref Rng & Units 10/28/2017 01/24/2017 01/01/2017  WBC 4.0 - 10.5 K/uL 6.9 3.9 5.1  Hemoglobin 13.0 - 17.0 g/dL 15.8 14.0 14.3  Hematocrit 39.0 - 52.0 % 45.1 42.4 41.7  Platelets 150 - 400 K/uL 167 172 185   Lab Results  Component Value Date   MCV 88.1 10/28/2017   MCV 89.6 01/24/2017   MCV 88.7 01/01/2017   Lab Results  Component Value Date   TSH 1.270 04/07/2018   Lab Results  Component Value Date   HGBA1C 5.9 (H) 05/26/2012   Lipid Panel     Component Value Date/Time   CHOL 111 01/24/2017 0950   TRIG 42 01/24/2017 0950   HDL 50 01/24/2017 0950   CHOLHDL 2.2 01/24/2017 0950   VLDL 8 01/24/2017 0950   LDLCALC 53 01/24/2017 0950     RADIOLOGY: No results found.  IMPRESSION:  1. Essential hypertension   2. Nonischemic cardiomyopathy ([place])   3. Sick sinus syndrome ([place])   4. Ventricular ectopy   5. Hyperlipidemia with target LDL less than 70   6. CKD (chronic kidney disease) stage 3, GFR 30-59 ml/min (HCC)     ASSESSMENT AND PLAN: [name] Is a 77 year old African-American male who has a history of a nonischemic cardiomyopathy with a remote ejection fraction  at 25%. His LV function has normalized on current medical regimen and on his  echo Doppler assessment of May 2016 was 45 - 50%.  His most recent echo in May 2019, EF was 50 to 55%.  He is euvolemic.  Blood pressure remains stable on amlodipine 10 mg, spironolactone 12.5 mg and isosorbide mononitrate 30 mg.  He also takes furosemide 20 mg daily.  The last several years he has had low atrial rhythm, the cardia with PVCs, and on ECG today while being completely asymptomatic his heart rate is in the 30s.  He appears to have a low atrial rhythm with second-degree block at times has junctional rhythm.  I suspect he has significant sick sinus syndrome.  He is reiterated that he is entirely asymptomatic and denies any  exertional shortness of breath with walking.  He specifically denies any dizziness.  I have recommended that he see Dr. Sallyanne Kuster in our office for further evaluation.  I suspect he may ultimately require permanent pacemaker insertion.  Reviewed his most recent laboratory.  Lipid studies are excellent with an LDL of 56 in March 2019.  There is no wheezing on exam and he continues to be on Breo Ellipta.  Has stage III chronic kidney disease and most recent creatinine is stable at 1.6.  Time spent: 25 minutes  Troy Sine, MD, [facility]  05/23/2018 2:36 PM

## 2018-05-22 NOTE — Patient Instructions (Signed)
Medication Instructions:  Your physician recommends that you continue on your current medications as directed. Please refer to the Current Medication list given to you today.  Follow-Up: We will call to let you know when your appointment is with Dr. Sallyanne Kuster next week    If you need a refill on your cardiac medications before your next appointment, please call your pharmacy.

## 2018-05-23 ENCOUNTER — Encounter: Payer: Self-pay | Admitting: Cardiovascular Disease

## 2018-05-30 ENCOUNTER — Encounter: Payer: Self-pay | Admitting: Cardiovascular Disease

## 2018-05-30 ENCOUNTER — Ambulatory Visit (INDEPENDENT_AMBULATORY_CARE_PROVIDER_SITE_OTHER): Payer: Medicare Other | Admitting: Cardiovascular Disease

## 2018-05-30 VITALS — BP 128/48 | HR 50 | Ht 68.0 in | Wt 173.0 lb

## 2018-05-30 DIAGNOSIS — I441 Atrioventricular block, second degree: Secondary | ICD-10-CM | POA: Diagnosis not present

## 2018-05-30 DIAGNOSIS — I25118 Atherosclerotic heart disease of native coronary artery with other forms of angina pectoris: Secondary | ICD-10-CM

## 2018-05-30 DIAGNOSIS — I10 Essential (primary) hypertension: Secondary | ICD-10-CM | POA: Diagnosis not present

## 2018-05-30 DIAGNOSIS — N183 Chronic kidney disease, stage 3 unspecified: Secondary | ICD-10-CM

## 2018-05-30 DIAGNOSIS — I472 Ventricular tachycardia: Secondary | ICD-10-CM

## 2018-05-30 DIAGNOSIS — I495 Sick sinus syndrome: Secondary | ICD-10-CM

## 2018-05-30 DIAGNOSIS — I4729 Other ventricular tachycardia: Secondary | ICD-10-CM

## 2018-05-30 NOTE — Progress Notes (Signed)
Cardiology Office Note:    Date:  06/04/2018   ID:  [name], DOB 1941-08-01, MRN [id number]  PCP:  Lucianne Lei, MD  Cardiologist: Claiborne Billings Referring MD: Lucianne Lei, MD   Chief Complaint  Patient presents with  . Bradycardia    discuss pacemaker    History of Present Illness:    [name] is a 77 y.o. 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 Claiborne Billings when he presented with the heart rate in the 30s with evidence of both sinus bradycardia/low atrial rhythm and periods of second-degree AV block Mobitz type I.  With the exception of timolol eyedrops he does not take any medications with negative chronotropic effect.  The patient specifically denies any chest pain at rest or exertion, dyspnea at rest or with exertion, orthopnea, paroxysmal nocturnal dyspnea, syncope, palpitations, focal neurological deficits, intermittent claudication, lower extremity edema, unexplained weight gain, cough, hemoptysis or wheezing.  On first account he claims to be asymptomatic, but with more detailed interrogation he has clear complaints of fatigue.  Past Medical History:  Diagnosis Date  . CAD (coronary artery disease) 80% stenosis diag of the LAD, 30% in OM2 branch of LCX in 2009    a. Nonobstructive CAD by cath 11/2011 with the exception of the pre-existing diagonal branch #2 lesion.  . Chronic systolic CHF (congestive heart failure) ([place])   . CKD (chronic kidney disease) stage 3, GFR 30-59 ml/min (HCC)   . Colon polyp, hyperplastic   . History of stress test 06/01/2012   Normal myocardial perfusion study. compared to the previous study there is no significant change. this is a low risk scan  . Hypertension     . Legally blind    "both eyes"  . Myocardial infarction ([place]) 11/22/11  . NICM (nonischemic cardiomyopathy) ([facility])    a. Remote hx of dilated NICM with EF ranging 20-45%, including normal EF by echo (55-60%) in 2014.  Marland Kitchen Peripheral arterial disease ([place])    a. 06/2014: ABI right 0.99, left 1.2, LE dopplers revealing an occluded right posterior tibial. As symptoms were not felt r/t claudication, no further w/u at the time.  Marland Kitchen PVC's (premature ventricular contractions)   . 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 Requiring discontinuation of BB dose.  Marland Kitchen Spondylolisthesis   . Ventricular bigeminy   . Ventricular tachycardia (paroxysmal) ([place]) 04/11/2015    Past Surgical History:  Procedure Laterality Date  . BACK SURGERY    . CARDIAC CATHETERIZATION  11/2011  . CARDIAC CATHETERIZATION  11/2011   didn't demonstrate high grade obstructive disease to account for his LV dysfunction.  Marland Kitchen CATARACT EXTRACTION, BILATERAL  1990's  . CYSTOSCOPY    . CYSTOSCOPY WITH URETHRAL DILATATION N/A 05/04/2013   Procedure: CYSTOSCOPY WITH URETHRAL DILATATION;  Surgeon: Eustace Moore, MD;  Location: [place] [facility];  Service: Neurosurgery;  Laterality: N/A;  with insertion of foley catheter  . EP study and ablation of VT  7/13   PVC focus mapped to the right coronary cusp of the aorta, limited ablation performed due to proximity of the focus to the right coronary artery  . EYE SURGERY    . LEFT HEART CATHETERIZATION WITH CORONARY ANGIOGRAM N/A  11/25/2011   Procedure: LEFT HEART CATHETERIZATION WITH CORONARY ANGIOGRAM;  Surgeon: Leonie Man, MD;  Location: [facility] CATH LAB;  Service: Cardiovascular;  Laterality: N/A;  . POSTERIOR FUSION [place]  . ROTATOR CUFF REPAIR  2000's   left  . V-TACH ABLATION N/A 06/06/2012   Procedure: V-TACH ABLATION;  Surgeon: Thompson Grayer, MD;  Location[facility] C[facility] LAB;  Service: Cardiovascular;  Laterality: N/A;    Current Medications: Current Meds  Medication Sig  .  amLODipine (NORVASC) 10 MG tablet Take 1 tablet (10 mg total) by mouth daily.  Marland Kitchen aspirin 81 MG tablet Take 81 mg by mouth daily.  . dorzolamide-timolol (COSOPT) 22.3-6.8 MG/ML ophthalmic solution INT 1 GTT IN OU BID  . fluticasone furoate-vilanterol (BREO ELLIPTA) 100-25 MCG/INH AEPB Inhale 1 puff into the lungs daily. (For 14 days)  . furosemide (LASIX) 20 MG tablet TAKE 1 TABLET(20 MG) BY MOUTH DAILY (Patient taking differently: TAKE 1 TABLET(20 MG) BY MOUTH DAILY, pt takes one every other day)  . isosorbide dinitrate (ISORDIL) 30 MG tablet Take 30 mg by mouth daily.  . isosorbide mononitrate (IMDUR) 30 MG 24 hr tablet Take 1 tablet (30 mg total) by mouth daily. KEEP OV.  Marland Kitchen LUMIGAN 0.01 % SOLN Place 1 drop into both eyes daily.  . nitroGLYCERIN (NITROSTAT) 0.4 MG SL tablet Place 1 tablet (0.4 mg total) under the tongue every 5 (five) minutes as needed for chest pain.  Marland Kitchen RESTASIS 0.05 % ophthalmic emulsion Place 1 drop into both eyes daily.  Marland Kitchen spironolactone (ALDACTONE) 25 MG tablet Take 0.5 tablets (12.5 mg total) by mouth daily. (Patient taking differently: Take 12.5 mg by mouth daily. )  . timolol (TIMOPTIC) 0.5 % ophthalmic solution INT 1 GTT IN OU D     Allergies:   Patient has no known allergies.   Social History   Socioeconomic History  . Marital status: Legally Separated    Spouse name: Not on file  . Number of children: 3  . Years of education: Not on file  . Highest education level: Not on file  Occupational History  . Occupation: Retired    Fish farm manager: RETIRED  Social Needs  . Financial resource strain: Not on file  . Food insecurity:    Worry: Not on file    Inability: Not on file  . Transportation needs:    Medical: Not on file    Non-medical: Not on file  Tobacco Use  . Smoking status: Former Smoker    Years: 50.00    Types: Cigarettes    Last attempt to quit: 11/23/1999    Years since quitting: 18.5  . Smokeless tobacco: Never Used  Substance and Sexual Activity    . 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 Drug use: No  . Sexual activity: Yes  Lifestyle  . Physical activity:    Days per week: Not on file    Minutes per session: Not on file  . Stress: Not on file  Relationships  . Social connections:    Talks on phone: Not on file    Gets together: Not on file    Attends religious service: Not on file    Active member of club or organization: Not on file    Attends meetings of clubs or organizations: Not on file    Relationship status: Not on file  Other Topics Concern  . Not on file  Social  History Narrative  . Not on file     Family History: The patient's family history includes Asthma in his mother; Other in his unknown relative.  ROS:   Please see the history of present illness.     All other systems reviewed and are negative.  EKGs/Labs/Other Studies Reviewed:    The following studies were reviewed today: Notes from Dr. Claiborne Billings, last echo and nuclear stress test.  EKG:  EKG is not ordered today.  The ekg ordered 05/22/2018 demonstrates severe bradycardia, low atrial rhythm, second-degree AV block probably Mobitz type I, a single PVC.  Overall heart rate is only 33 bpm.  Recent Labs: 10/28/2017: Hemoglobin 15.8; Platelets 167 04/07/2018: ALT 16; BUN 16; Creatinine, Ser 1.60; Magnesium 1.9; Potassium 4.2; Sodium 139; TSH 1.270  Recent Lipid Panel    Component Value Date/Time   CHOL 111 01/24/2017 0950   TRIG 42 01/24/2017 0950   HDL 50 01/24/2017 0950   CHOLHDL 2.2 01/24/2017 0950   VLDL 8 01/24/2017 0950   LDLCALC 53 01/24/2017 0950    Physical Exam:    VS:  BP (!) 128/48 (BP Location: Left Arm, Patient Position: Sitting, Cuff Size: Normal)   Pulse (!) 50   Ht 5\' 8"  (1.727 m)   Wt 173 lb (78.5 kg)   BMI 26.30 kg/m     Wt Readings from Last 3 Encounters:  05/30/18 173 lb (78.5 kg)  05/22/18 172 lb 12.8 oz (78.4 kg)  04/07/18 173 lb 6.4 oz (78.7 kg)      GEN:  Well nourished, well developed in no acute distress HEENT: Normal NECK: No JVD; No carotid bruits LYMPHATICS: No lymphadenopathy CARDIAC: RRR, no murmurs, rubs, gallops RESPIRATORY:  Clear to auscultation without rales, wheezing or rhonchi  ABDOMEN: Soft, non-tender, non-distended MUSCULOSKELETAL:  No edema; No deformity  SKIN: Warm and dry NEUROLOGIC:  Alert and oriented x 3 PSYCHIATRIC:  Normal affect   ASSESSMENT:    1. Second degree AV block   2. SSS (sick sinus syndrome) ([place])   3. Coronary artery disease of native artery of native heart with stable angina pectoris ([place])   4. NSVT (nonsustained ventricular tachycardia) ([place])   5. Essential hypertension   6. CKD (chronic kidney disease) stage 3, GFR 30-59 ml/min (HCC)    PLAN:    In order of problems listed above:  1. 2nd deg AVB: I believe he is symptomatic.  He has fatigue.  There are no medications that are likely to be contributing to this.  I think he meets criteria for dual chamber pacemaker implantation.  Frequent ventricular pacing can be anticipated.  Both his nuclear test and his echo show ejection fraction around 50%.  At this point he does not appear to need a CRT device. This procedure has been fully reviewed with the patient and written informed consent has been obtained. 2. SSS: Bradycardia is also related to sinus node dysfunction, atrial mechanism seems to be from a low atrial/junctional pacemaker. 3. CAD: He does not have angina pectoris on long-acting nitrates. 4. NSVT: When she has a pacemaker, if necessary beta-blockers can be administered.  Note that he does have a reactive airway disease. 5. HTN: Well-controlled 6.  CKD 3: creat 1.6, GFR 45-50.  While we discussed the pacemaker in the office, he initially declined to have the procedure performed.  After going home and talking to his wife, he called back and said that he wanted to go ahead with pacemaker implantation.   Medication  Adjustments/Labs and  Tests Ordered: Current medicines are reviewed at length with the patient today.  Concerns regarding medicines are outlined above.  No orders of the defined types were placed in this encounter.  No orders of the defined types were placed in this encounter.   Patient Instructions  Dr Sallyanne Kuster recommends that you follow-up with him as needed.  Please call our office if you pass out.    Signed, Sanda Klein, MD  06/04/2018 10:12 AM    [facility]

## 2018-05-30 NOTE — Patient Instructions (Signed)
Dr Sallyanne Kuster recommends that you follow-up with him as needed.  Please call our office if you pass out.

## 2018-06-01 ENCOUNTER — Telehealth: Payer: Self-pay | Admitting: Cardiovascular Disease

## 2018-06-01 DIAGNOSIS — I441 Atrioventricular block, second degree: Secondary | ICD-10-CM

## 2018-06-01 DIAGNOSIS — I495 Sick sinus syndrome: Secondary | ICD-10-CM

## 2018-06-01 NOTE — Telephone Encounter (Signed)
Call placed to the patient. He stated that he was ready for the pacemaker. Message routed.

## 2018-06-01 NOTE — Telephone Encounter (Signed)
Pt calling   Pt was told to call the office when he is ready to proceed with getting a pacemaker. Please call pt to ask next step

## 2018-06-02 ENCOUNTER — Ambulatory Visit: Payer: Medicare Other | Admitting: Cardiovascular Disease

## 2018-06-04 ENCOUNTER — Encounter: Payer: Self-pay | Admitting: Cardiovascular Disease

## 2018-06-05 ENCOUNTER — Other Ambulatory Visit: Payer: Self-pay | Admitting: Cardiovascular Disease

## 2018-06-05 NOTE — Telephone Encounter (Signed)
Pacemaker implant scheduled for 07/12/18 at 1:30p.

## 2018-06-23 ENCOUNTER — Other Ambulatory Visit: Payer: Self-pay | Admitting: Cardiovascular Disease

## 2018-06-28 ENCOUNTER — Ambulatory Visit (INDEPENDENT_AMBULATORY_CARE_PROVIDER_SITE_OTHER): Payer: Medicare Other | Admitting: Podiatry

## 2018-06-28 DIAGNOSIS — M79674 Pain in right toe(s): Secondary | ICD-10-CM

## 2018-06-28 DIAGNOSIS — M79675 Pain in left toe(s): Secondary | ICD-10-CM | POA: Diagnosis not present

## 2018-06-28 DIAGNOSIS — M79676 Pain in unspecified toe(s): Secondary | ICD-10-CM

## 2018-06-28 DIAGNOSIS — B351 Tinea unguium: Secondary | ICD-10-CM | POA: Diagnosis not present

## 2018-06-28 DIAGNOSIS — I739 Peripheral vascular disease, unspecified: Secondary | ICD-10-CM | POA: Diagnosis not present

## 2018-06-28 DIAGNOSIS — L84 Corns and callosities: Secondary | ICD-10-CM

## 2018-06-28 NOTE — Progress Notes (Signed)
Subjective:   Patient ID: [name], male   DOB: 77 y.o.   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

## 2018-07-10 DIAGNOSIS — I441 Atrioventricular block, second degree: Secondary | ICD-10-CM | POA: Diagnosis not present

## 2018-07-10 DIAGNOSIS — I495 Sick sinus syndrome: Secondary | ICD-10-CM | POA: Diagnosis not present

## 2018-07-11 LAB — BASIC METABOLIC PANEL
BUN/Creatinine Ratio: 10 (ref 10–24)
BUN: 15 mg/dL (ref 8–27)
CO2: 21 mmol/L (ref 20–29)
CREATININE: 1.52 mg/dL — AB (ref 0.76–1.27)
Calcium: 9.2 mg/dL (ref 8.6–10.2)
Chloride: 108 mmol/L — ABNORMAL HIGH (ref 96–106)
GFR calc Af Amer: 50 mL/min/{1.73_m2} — ABNORMAL LOW (ref >59)
GFR calc non Af Amer: 43 mL/min/{1.73_m2} — ABNORMAL LOW (ref >59)
GLUCOSE: 129 mg/dL — AB (ref 65–99)
Potassium: 4 mmol/L (ref 3.5–5.2)
SODIUM: 144 mmol/L (ref 134–144)

## 2018-07-11 LAB — CBC
HEMATOCRIT: 42.8 % (ref 37.5–51.0)
Hemoglobin: 14.2 g/dL (ref 13.0–17.7)
MCH: 30.1 pg (ref 26.6–33.0)
MCHC: 33.2 g/dL (ref 31.5–35.7)
MCV: 91 fL (ref 79–97)
PLATELETS: 146 10*3/uL — AB (ref 150–450)
RBC: 4.72 x10E6/uL (ref 4.14–5.80)
RDW: 12.2 % — ABNORMAL LOW (ref 12.3–15.4)
WBC: 5.3 10*3/uL (ref 3.4–10.8)

## 2018-07-11 LAB — PROTIME-INR
INR: 1.2 (ref 0.8–1.2)
PROTHROMBIN TIME: 12.8 s — AB (ref 9.1–12.0)

## 2018-07-12 ENCOUNTER — Ambulatory Visit (HOSPITAL_COMMUNITY)
Admission: RE | Admit: 2018-07-12 | Discharge: 2018-07-13 | Disposition: A | Discharge status: Home / Self Care | Payer: Medicare Other | Source: Ambulatory Visit | Attending: Cardiovascular Disease | Admitting: Cardiovascular Disease

## 2018-07-12 ENCOUNTER — Encounter (HOSPITAL_COMMUNITY): Payer: Self-pay | Admitting: *Deleted

## 2018-07-12 ENCOUNTER — Ambulatory Visit (HOSPITAL_COMMUNITY)
Admission: RE | Disposition: A | Discharge status: Home / Self Care | Payer: Self-pay | Source: Ambulatory Visit | Attending: Cardiovascular Disease

## 2018-07-12 ENCOUNTER — Other Ambulatory Visit: Payer: Self-pay | Admitting: Cardiovascular Disease

## 2018-07-12 ENCOUNTER — Other Ambulatory Visit: Payer: Self-pay

## 2018-07-12 DIAGNOSIS — I441 Atrioventricular block, second degree: Secondary | ICD-10-CM

## 2018-07-12 DIAGNOSIS — N183 Chronic kidney disease, stage 3 (moderate): Secondary | ICD-10-CM | POA: Insufficient documentation

## 2018-07-12 DIAGNOSIS — I472 Ventricular tachycardia, unspecified: Secondary | ICD-10-CM

## 2018-07-12 DIAGNOSIS — Z86010 Personal history of colonic polyps: Secondary | ICD-10-CM | POA: Diagnosis not present

## 2018-07-12 DIAGNOSIS — R001 Bradycardia, unspecified: Secondary | ICD-10-CM

## 2018-07-12 DIAGNOSIS — Z9889 Other specified postprocedural states: Secondary | ICD-10-CM | POA: Insufficient documentation

## 2018-07-12 DIAGNOSIS — Z87891 Personal history of nicotine dependence: Secondary | ICD-10-CM | POA: Diagnosis not present

## 2018-07-12 DIAGNOSIS — Z955 Presence of coronary angioplasty implant and graft: Secondary | ICD-10-CM | POA: Diagnosis not present

## 2018-07-12 DIAGNOSIS — I429 Cardiomyopathy, unspecified: Secondary | ICD-10-CM | POA: Diagnosis not present

## 2018-07-12 DIAGNOSIS — Z9841 Cataract extraction status, right eye: Secondary | ICD-10-CM | POA: Diagnosis not present

## 2018-07-12 DIAGNOSIS — Z981 Arthrodesis status: Secondary | ICD-10-CM | POA: Insufficient documentation

## 2018-07-12 DIAGNOSIS — I252 Old myocardial infarction: Secondary | ICD-10-CM | POA: Insufficient documentation

## 2018-07-12 DIAGNOSIS — I5022 Chronic systolic (congestive) heart failure: Secondary | ICD-10-CM | POA: Insufficient documentation

## 2018-07-12 DIAGNOSIS — Z9842 Cataract extraction status, left eye: Secondary | ICD-10-CM | POA: Insufficient documentation

## 2018-07-12 DIAGNOSIS — Z7982 Long term (current) use of aspirin: Secondary | ICD-10-CM | POA: Insufficient documentation

## 2018-07-12 DIAGNOSIS — I495 Sick sinus syndrome: Secondary | ICD-10-CM | POA: Diagnosis not present

## 2018-07-12 DIAGNOSIS — I4729 Other ventricular tachycardia: Secondary | ICD-10-CM

## 2018-07-12 DIAGNOSIS — Z960 Presence of urogenital implants: Secondary | ICD-10-CM | POA: Insufficient documentation

## 2018-07-12 DIAGNOSIS — Z79899 Other long term (current) drug therapy: Secondary | ICD-10-CM | POA: Diagnosis not present

## 2018-07-12 DIAGNOSIS — H548 Legal blindness, as defined in USA: Secondary | ICD-10-CM | POA: Diagnosis not present

## 2018-07-12 DIAGNOSIS — I2510 Atherosclerotic heart disease of native coronary artery without angina pectoris: Secondary | ICD-10-CM | POA: Diagnosis not present

## 2018-07-12 DIAGNOSIS — Z950 Presence of cardiac pacemaker: Secondary | ICD-10-CM | POA: Diagnosis present

## 2018-07-12 DIAGNOSIS — I130 Hypertensive heart and chronic kidney disease with heart failure and stage 1 through stage 4 chronic kidney disease, or unspecified chronic kidney disease: Secondary | ICD-10-CM | POA: Diagnosis not present

## 2018-07-12 HISTORY — PX: PACEMAKER IMPLANT: EP1218

## 2018-07-12 LAB — SURGICAL PCR SCREEN
MRSA, PCR: NEGATIVE
Staphylococcus aureus: NEGATIVE

## 2018-07-12 SURGERY — PACEMAKER IMPLANT

## 2018-07-12 MED ORDER — ACETAMINOPHEN 325 MG PO TABS
325.0000 mg | ORAL_TABLET | ORAL | Status: DC | PRN
  Administered 2018-07-12: 650 mg via ORAL
  Filled 2018-07-12: qty 2

## 2018-07-12 MED ORDER — MIDAZOLAM HCL 5 MG/5ML IJ SOLN
INTRAMUSCULAR | Status: AC
  Filled 2018-07-12: qty 5

## 2018-07-12 MED ORDER — ALBUTEROL SULFATE (2.5 MG/3ML) 0.083% IN NEBU: 3.0000 mL | INHALATION_SOLUTION | Freq: Four times a day (QID) | RESPIRATORY_TRACT | Status: DC | PRN

## 2018-07-12 MED ORDER — NITROGLYCERIN 0.4 MG SL SUBL: 0.4000 mg | SUBLINGUAL_TABLET | SUBLINGUAL | Status: DC | PRN

## 2018-07-12 MED ORDER — SODIUM CHLORIDE 0.9 % IV SOLN
INTRAVENOUS | Status: DC
  Administered 2018-07-12 (×2): via INTRAVENOUS

## 2018-07-12 MED ORDER — MUPIROCIN 2 % EX OINT
TOPICAL_OINTMENT | CUTANEOUS | Status: AC
  Filled 2018-07-12: qty 22

## 2018-07-12 MED ORDER — ONDANSETRON HCL 4 MG/2ML IJ SOLN: 4.0000 mg | Freq: Four times a day (QID) | INTRAMUSCULAR | Status: DC | PRN

## 2018-07-12 MED ORDER — SODIUM CHLORIDE 0.9 % IV SOLN
INTRAVENOUS | Status: AC
  Filled 2018-07-12: qty 2

## 2018-07-12 MED ORDER — FENTANYL CITRATE (PF) 100 MCG/2ML IJ SOLN
INTRAMUSCULAR | Status: DC | PRN
  Administered 2018-07-12 (×2): 25 ug via INTRAVENOUS

## 2018-07-12 MED ORDER — LIDOCAINE HCL (PF) 1 % IJ SOLN
INTRAMUSCULAR | Status: AC
  Filled 2018-07-12: qty 60

## 2018-07-12 MED ORDER — CYCLOSPORINE 0.05 % OP EMUL
1.0000 [drp] | Freq: Every day | OPHTHALMIC | Status: DC
  Administered 2018-07-13: 1 [drp] via OPHTHALMIC
  Filled 2018-07-12: qty 1

## 2018-07-12 MED ORDER — LIDOCAINE HCL (PF) 1 % IJ SOLN
INTRAMUSCULAR | Status: DC | PRN
  Administered 2018-07-12: 55 mL

## 2018-07-12 MED ORDER — AMLODIPINE BESYLATE 10 MG PO TABS
10.0000 mg | ORAL_TABLET | Freq: Every day | ORAL | Status: DC
  Administered 2018-07-13: 10 mg via ORAL
  Filled 2018-07-12: qty 1

## 2018-07-12 MED ORDER — CEFAZOLIN SODIUM-DEXTROSE 2-4 GM/100ML-% IV SOLN
2.0000 g | INTRAVENOUS | Status: AC
  Administered 2018-07-12: 2 g via INTRAVENOUS
  Filled 2018-07-12: qty 100

## 2018-07-12 MED ORDER — ISOSORBIDE MONONITRATE ER 30 MG PO TB24
30.0000 mg | ORAL_TABLET | Freq: Every day | ORAL | Status: DC
  Administered 2018-07-13: 30 mg via ORAL
  Filled 2018-07-12: qty 1

## 2018-07-12 MED ORDER — SODIUM CHLORIDE 0.9 % IV SOLN
80.0000 mg | INTRAVENOUS | Status: DC
  Filled 2018-07-12: qty 2

## 2018-07-12 MED ORDER — HEPARIN (PORCINE) IN NACL 1000-0.9 UT/500ML-% IV SOLN
INTRAVENOUS | Status: AC
  Filled 2018-07-12: qty 500

## 2018-07-12 MED ORDER — CEFAZOLIN SODIUM-DEXTROSE 1-4 GM/50ML-% IV SOLN
1.0000 g | Freq: Four times a day (QID) | INTRAVENOUS | Status: AC
  Administered 2018-07-12 – 2018-07-13 (×3): 1 g via INTRAVENOUS
  Filled 2018-07-12 (×4): qty 50

## 2018-07-12 MED ORDER — MIDAZOLAM HCL 5 MG/5ML IJ SOLN
INTRAMUSCULAR | Status: DC | PRN
  Administered 2018-07-12 (×2): 1 mg via INTRAVENOUS

## 2018-07-12 MED ORDER — ASPIRIN 81 MG PO CHEW
81.0000 mg | CHEWABLE_TABLET | Freq: Once | ORAL | Status: AC
  Administered 2018-07-12: 81 mg via ORAL
  Filled 2018-07-12: qty 1

## 2018-07-12 MED ORDER — SODIUM CHLORIDE 0.9 % IV SOLN: INTRAVENOUS | Status: DC

## 2018-07-12 MED ORDER — HEPARIN (PORCINE) IN NACL 2-0.9 UNITS/ML
INTRAMUSCULAR | Status: AC | PRN
  Administered 2018-07-12: 500 mL

## 2018-07-12 MED ORDER — DORZOLAMIDE HCL-TIMOLOL MAL 2-0.5 % OP SOLN
1.0000 [drp] | Freq: Every day | OPHTHALMIC | Status: DC
  Administered 2018-07-13: 1 [drp] via OPHTHALMIC
  Filled 2018-07-12: qty 10

## 2018-07-12 MED ORDER — CEFAZOLIN SODIUM-DEXTROSE 2-4 GM/100ML-% IV SOLN
INTRAVENOUS | Status: AC
  Filled 2018-07-12: qty 100

## 2018-07-12 MED ORDER — MUPIROCIN 2 % EX OINT
1.0000 "application " | TOPICAL_OINTMENT | Freq: Once | CUTANEOUS | Status: AC
  Administered 2018-07-12: 13:00:00 via TOPICAL
  Filled 2018-07-12: qty 22

## 2018-07-12 MED ORDER — FENTANYL CITRATE (PF) 100 MCG/2ML IJ SOLN
INTRAMUSCULAR | Status: AC
  Filled 2018-07-12: qty 2

## 2018-07-12 MED ORDER — SPIRONOLACTONE 12.5 MG HALF TABLET
12.5000 mg | ORAL_TABLET | Freq: Every day | ORAL | Status: DC
  Administered 2018-07-13: 12.5 mg via ORAL
  Filled 2018-07-12: qty 1

## 2018-07-12 MED ORDER — ACETAMINOPHEN 325 MG PO TABS
ORAL_TABLET | ORAL | Status: AC
  Filled 2018-07-12: qty 2

## 2018-07-12 MED ORDER — CHLORHEXIDINE GLUCONATE 4 % EX LIQD
60.0000 mL | Freq: Once | CUTANEOUS | Status: DC
  Filled 2018-07-12: qty 60

## 2018-07-12 SURGICAL SUPPLY — 7 items
CABLE SURGICAL S-101-97-12 (CABLE) ×3 IMPLANT
LEAD TENDRIL MRI 46CM LPA1200M (Lead) ×2 IMPLANT
LEAD TENDRIL MRI 52CM LPA1200M (Lead) ×2 IMPLANT
PACEMAKER ASSURITY DR-RF (Pacemaker) ×2 IMPLANT
PAD PRO RADIOLUCENT 2001M-C (PAD) ×3 IMPLANT
SHEATH CLASSIC 8F (SHEATH) ×4 IMPLANT
TRAY PACEMAKER INSERTION (PACKS) ×3 IMPLANT

## 2018-07-12 NOTE — Op Note (Signed)
Procedure report  Procedure performed:  1. Implantation of new DUAL chamber permanent pacemaker 2. Fluoroscopy 3. Light sedation  Reason for procedure: Symptomatic bradycardia due to: Sinus node dysfunction Second degree atrioventricular block Mobitz type I   Procedure performed by: Sanda Klein, MD  Complications: None  Estimated blood loss: <10 mL  Medications administered during procedure: Ancef 2 g intravenously Lidocaine 1% 30 mL locally,  Fentanyl 50 mcg intravenously Versed 2 mg intravenously  During this procedure the patient is administered a total of Versed 2 mg and Fentanyl 50 mcg to achieve and maintain moderate conscious sedation.  The patient's heart rate, blood pressure, and oxygen saturation are monitored continuously during the procedure. The period of conscious sedation is 26 minutes, of which I was present face-to-face 100% of this time.   Device detailsResearch scientist (life sciences). Jude Assurity MRI model 2272 serial number [id number] Right atrial lead St. Jude Tendril MRI model Q8494859, serial number [id number] Right ventricular lead St. Jude Tendril MRI model [id number] serial number [id number]  Procedure details:  After the risks and benefits of the procedure were discussed the patient provided informed consent and was brought to the cardiac cath lab in the fasting state. The patient was prepped and draped in usual sterile fashion. Local anesthesia with 1% lidocaine was administered to to the left infraclavicular area. A 5-6 cm horizontal incision was made parallel with and 2-3 cm caudal to the left clavicle. Using electrocautery and blunt dissection a prepectoral pocket was created down to the level of the pectoralis major muscle fascia. The pocket was carefully inspected for hemostasis. An antibiotic-soaked sponge was placed in the pocket.  Under fluoroscopic guidance and using the modified Seldinger technique 2 separate venipunctures were performed to access  the left subclavian vein. No difficulty was encountered accessing the vein.  Two J-tip guidewires were subsequently exchanged for 6 French safe sheaths.  Under fluoroscopic guidance the ventricular lead was advanced to level of the mid to apical right ventricular septum and thet active-fixation helix was deployed. Prominent current of injury was seen. Satisfactory pacing and sensing parameters were recorded. There was no evidence of diaphragmatic stimulation at maximum device output. The safe sheath was peeled away and the lead was secured in place with 2-0 silk.  In similar fashion the right atrial lead was advanced to the level of the atrial appendage. The active-fixation helix was deployed. There was prominent current of injury. Satisfactory  pacing and sensing parameters were recorded. There was no evidence of diaphragmatic stimulation with pacing at maximum device output. The safe sheath was peeled away and the lead was secured in place with 2-0 silk.  The antibiotic-soaked sponge was removed from the pocket. The pocket was flushed with copious amounts of antibiotic solution. Reinspection showed excellent hemostasis.  The ventricular lead was connected to the generator and appropriate ventricular pacing was seen. Subsequently the atrial lead was also connected. Repeat testing of the lead parameters later showed excellent values.  The entire system was then carefully inserted in the pocket with care been taking that the leads and device assumed a comfortable position without pressure on the incision. Great care was taken that the leads be located deep to the generator. The pocket was then closed in layers using 2 layers of 2-0 Vicryl and cutaneous staples, after which a sterile dressing was applied.  At the end of the procedure the following lead parameters were encountered:  Right atrial lead  sensed P waves 4.2 mV, impedance 804 ohms, threshold 1.1  V at 0.5 ms pulse width.  Right ventricular  lead sensed R waves 10.4 mV, impedance 992 ohms, threshold 0.5 V at 0.5 ms pulse width.   Sanda Klein, MD, [facility] [phone] office [phone] pager

## 2018-07-12 NOTE — H&P (Signed)
Cardiology Admission History and Physical:   Patient ID: TLALOC [name]; MRN: [id number]; DOB: [birth year]   Admission date: 07/12/2018  Primary Care Provider: Lucianne Lei, MD Primary Cardiologist: Shelva Majestic, MD   Chief Complaint:  Pacemaker implantation  Patient Profile:   [name] is a 77 y.o. male with a history of symptomatic cardio due to both sinus bradycardia and periods of Mobitz type I second-degree atrioventricular block.  He is here for elective implantation of a dual-chamber permanent pacemaker.  History of Present Illness:   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  ECG has shown evidence of both sinus bradycardia and second-degree AV block Mobitz type I.  Mental eyedrops for glaucoma he is not receiving any medications with negative chronotropic effect   Past Medical History:  Diagnosis Date  . CAD (coronary artery disease) 80% stenosis diag of the LAD, 30% in OM2 branch of LCX in 2009    a. Nonobstructive CAD by cath 11/2011 with the exception of the pre-existing diagonal branch #2 lesion.  . Chronic systolic CHF (congestive heart failure) ([place])   . CKD (chronic kidney disease) stage 3, GFR 30-59 ml/min (HCC)   . Colon polyp, hyperplastic   . History of stress test 06/01/2012   Normal myocardial perfusion study. compared to the previous study there is no significant change. this is a low risk scan  . Hypertension   . Legally blind    "both eyes"  . Myocardial infarction ([place]) 11/22/11  . NICM (nonischemic cardiomyopathy) ([place])    a. Remote hx of dilated NICM with  EF ranging 20-45%, including normal EF by echo (55-60%) in 2014.  Marland Kitchen Peripheral arterial disease ([place])    a. 06/2014: ABI right 0.99, left 1.2, LE dopplers revealing an occluded right posterior tibial. As symptoms were not felt r/t claudication, no further w/u at the time.  Marland Kitchen PVC's (premature ventricular contractions)   . 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 Requiring discontinuation of BB dose.  Marland Kitchen Spondylolisthesis   . Ventricular bigeminy   . Ventricular tachycardia (paroxysmal) ([place]) 04/11/2015    Past Surgical History:  Procedure Laterality Date  . BACK SURGERY    . CARDIAC CATHETERIZATION  11/2011  . CARDIAC CATHETERIZATION  11/2011   didn't demonstrate high grade obstructive disease to account for his LV dysfunction.  Marland Kitchen CATARACT EXTRACTION, BILATERAL  1990's  . CYSTOSCOPY    . CYSTOSCOPY WITH URETHRAL DILATATION N/A 05/04/2013   Procedure: CYSTOSCOPY WITH URETHRAL DILATATION;  Surgeon: Eustace Moore, MD;  Location: [facility];  Service: Neurosurgery;  Laterality: N/A;  with insertion of foley catheter  . EP study and ablation of VT  7/13   PVC focus mapped to the right coronary cusp of the aorta, limited ablation performed due to proximity of the focus to the right coronary artery  . EYE SURGERY    . LEFT HEART CATHETERIZATION WITH CORONARY ANGIOGRAM N/A 11/25/2011   Procedure: LEFT HEART CATHETERIZATION WITH CORONARY ANGIOGRAM;  Surgeon: Leonie Man, MD;  Location: [facility] CATH LAB;  Service: Cardiovascular;  Laterality: N/A;  . POSTERIOR FUSION [place]  . ROTATOR CUFF  REPAIR  2000's   left  . V-TACH ABLATION N/A 06/06/2012   Procedure: V-TACH ABLATION;  Surgeon: Thompson Grayer, MD;  Location: [facility] CATH LAB;  Service: Cardiovascular;  Laterality: N/A;     Medications Prior to Admission: Prior to Admission medications   Medication Sig Start Date End Date Taking? Authorizing Provider  albuterol (PROVENTIL HFA;VENTOLIN HFA) 108 (90 Base) MCG/ACT inhaler Inhale 1 puff  into the lungs every 6 (six) hours as needed for wheezing or shortness of breath.   Yes [provider]  amLODipine (NORVASC) 10 MG tablet Take 1 tablet (10 mg total) by mouth daily. 05/08/18  Yes Troy Sine, MD  aspirin 325 MG tablet Take 325 mg by mouth daily.    Yes [provider]  dorzolamide-timolol (COSOPT) 22.3-6.8 MG/ML ophthalmic solution Place 1 drop into both eyes daily.  02/16/18  Yes [provider]  furosemide (LASIX) 20 MG tablet TAKE 1 TABLET(20 MG) BY MOUTH DAILY Patient taking differently: Take 20 mg by mouth every other day.  02/11/17  Yes Troy Sine, MD  isosorbide mononitrate (IMDUR) 30 MG 24 hr tablet TAKE 1 TABLET BY MOUTH DAILY...KEEP OFFICE VISIT Patient taking differently: Take 30 mg by mouth daily.  06/05/18  Yes Troy Sine, MD  LUMIGAN 0.01 % SOLN Place 1 drop into both eyes at bedtime.  12/06/14  Yes [provider]  nitroGLYCERIN (NITROSTAT) 0.4 MG SL tablet Place 1 tablet (0.4 mg total) under the tongue every 5 (five) minutes as needed for chest pain. 01/16/18  Yes Troy Sine, MD  RESTASIS 0.05 % ophthalmic emulsion Place 1 drop into both eyes daily. 12/30/14  Yes [provider]  spironolactone (ALDACTONE) 25 MG tablet Take 0.5 tablets (12.5 mg total) by mouth daily. 01/07/17  Yes Troy Sine, MD     Allergies:   No Known Allergies  Social History:   Social History   Socioeconomic History  . Marital status: Legally Separated    Spouse name: Not on file  . Number of children: 3  . Years of education: Not on file  . Highest education level: Not on file  Occupational History  . Occupation: Retired    Fish farm manager: RETIRED  Social Needs  . Financial resource strain: Not on file  . Food insecurity:    Worry: Not on file    Inability: Not on file  . Transportation needs:    Medical: Not on file    Non-medical: Not on file  Tobacco Use  . Smoking status: Former Smoker    Years: 50.00    Types:  Cigarettes    Last attempt to quit: 11/23/1999    Years since quitting: 18.6  . Smokeless tobacco: Never Used  Substance and Sexual Activity  . 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 Drug use: No  . Sexual activity: Yes  Lifestyle  . Physical activity:    Days per week: Not on file    Minutes per session: Not on file  . Stress: Not on file  Relationships  . Social connections:    Talks on phone: Not on file    Gets together: Not on file    Attends religious service: Not on file    Active member of club or organization: Not on file    Attends meetings of clubs or organizations: Not on file    Relationship status: Not on file  .  Intimate partner violence:    Fear of current or ex partner: Not on file    Emotionally abused: Not on file    Physically abused: Not on file    Forced sexual activity: Not on file  Other Topics Concern  . Not on file  Social History Narrative  . Not on file    Family History:   The patient's family history includes Asthma in his mother; Other in his unknown relative.    ROS:  Please see the history of present illness.  All other ROS reviewed and negative.     Physical Exam/Data:   Vitals:   07/12/18 1131  BP: (!) 174/78  Pulse: (!) 57  Temp: 98 F (36.7 C)  TempSrc: Oral  SpO2: 98%  Weight: 75.6 kg  Height: 5\' 8"  (1.727 m)   No intake or output data in the 24 hours ending 07/12/18 1320 Filed Weights   07/12/18 1131  Weight: 75.6 kg   Body mass index is 25.33 kg/m.  General:  Well nourished, well developed, in no acute distress HEENT: normal Lymph: no adenopathy Neck: no JVD Endocrine:  No thryomegaly Vascular: No carotid bruits; FA pulses 2+ bilaterally without bruits  Cardiac:  normal S1, S2; irregular rhythm; no murmur  Lungs:  clear to auscultation bilaterally, no wheezing, rhonchi or rales  Abd: soft, nontender, no hepatomegaly  Ext: no edema Musculoskeletal:  No  deformities, BUE and BLE strength normal and equal Skin: warm and dry  Neuro:  CNs 2-12 intact, no focal abnormalities noted Psych:  Normal affect    EKG:  The ECG that was done May 22, 2018 was personally reviewed and demonstrates marked sinus bradycardia as well as second-degree AV block Mobitz type I with junctional escape beats.  Overall heart rate 33 bpm.  Minor nonspecific intraventricular conduction delay. These ECG is pending.  Relevant CV Studies: Echo Apr 13, 2018 reviewed  Laboratory Data:  Chemistry Recent Labs  Lab 07/10/18 1152  NA 144  K 4.0  CL 108*  CO2 21  GLUCOSE 129*  BUN 15  CREATININE 1.52*  CALCIUM 9.2  GFRNONAA 43*  GFRAA 50*    No results for input(s): PROT, ALBUMIN, AST, ALT, ALKPHOS, BILITOT in the last 168 hours. Hematology Recent Labs  Lab 07/10/18 1152  WBC 5.3  RBC 4.72  HGB 14.2  HCT 42.8  MCV 91  MCH 30.1  MCHC 33.2  RDW 12.2*  PLT 146*   Cardiac EnzymesNo results for input(s): TROPONINI in the last 168 hours. No results for input(s): TROPIPOC in the last 168 hours.  BNPNo results for input(s): BNP, PROBNP in the last 168 hours.  DDimer No results for input(s): DDIMER in the last 168 hours.  Radiology/Studies:  No results found.  Assessment and Plan:   1. Symptomatic bradycardia secondary to sinus node dysfunction and second-degree AV block, most likely Mobitz type I (prolonging PR interval) but also with evidence of intraventricular conduction delay.  Normal left ventricular systolic function.  He meets criteria for implantation of a dual-chamber permanent pacemaker. This procedure has been fully reviewed with the patient and written informed consent has been obtained.    For questions or updates, please contact [place] Please consult [email or web address] for contact info under Cardiology/STEMI.    Signed, Sanda Klein, MD  07/12/2018 1:20 PM

## 2018-07-13 ENCOUNTER — Ambulatory Visit (HOSPITAL_COMMUNITY): Payer: Medicare Other

## 2018-07-13 ENCOUNTER — Encounter (HOSPITAL_COMMUNITY): Payer: Self-pay | Admitting: Cardiovascular Disease

## 2018-07-13 DIAGNOSIS — I2510 Atherosclerotic heart disease of native coronary artery without angina pectoris: Secondary | ICD-10-CM | POA: Diagnosis not present

## 2018-07-13 DIAGNOSIS — Z7982 Long term (current) use of aspirin: Secondary | ICD-10-CM | POA: Diagnosis not present

## 2018-07-13 DIAGNOSIS — I130 Hypertensive heart and chronic kidney disease with heart failure and stage 1 through stage 4 chronic kidney disease, or unspecified chronic kidney disease: Secondary | ICD-10-CM | POA: Diagnosis not present

## 2018-07-13 DIAGNOSIS — Z9841 Cataract extraction status, right eye: Secondary | ICD-10-CM | POA: Diagnosis not present

## 2018-07-13 DIAGNOSIS — Z950 Presence of cardiac pacemaker: Secondary | ICD-10-CM | POA: Diagnosis not present

## 2018-07-13 DIAGNOSIS — I495 Sick sinus syndrome: Secondary | ICD-10-CM | POA: Diagnosis not present

## 2018-07-13 DIAGNOSIS — Z955 Presence of coronary angioplasty implant and graft: Secondary | ICD-10-CM | POA: Diagnosis not present

## 2018-07-13 DIAGNOSIS — H548 Legal blindness, as defined in USA: Secondary | ICD-10-CM | POA: Diagnosis not present

## 2018-07-13 DIAGNOSIS — Z9842 Cataract extraction status, left eye: Secondary | ICD-10-CM | POA: Diagnosis not present

## 2018-07-13 DIAGNOSIS — Z981 Arthrodesis status: Secondary | ICD-10-CM | POA: Diagnosis not present

## 2018-07-13 DIAGNOSIS — I441 Atrioventricular block, second degree: Secondary | ICD-10-CM | POA: Diagnosis not present

## 2018-07-13 DIAGNOSIS — Z960 Presence of urogenital implants: Secondary | ICD-10-CM | POA: Diagnosis not present

## 2018-07-13 DIAGNOSIS — Z86010 Personal history of colonic polyps: Secondary | ICD-10-CM | POA: Diagnosis not present

## 2018-07-13 DIAGNOSIS — Z9889 Other specified postprocedural states: Secondary | ICD-10-CM | POA: Diagnosis not present

## 2018-07-13 DIAGNOSIS — Z87891 Personal history of nicotine dependence: Secondary | ICD-10-CM | POA: Diagnosis not present

## 2018-07-13 DIAGNOSIS — I252 Old myocardial infarction: Secondary | ICD-10-CM | POA: Diagnosis not present

## 2018-07-13 DIAGNOSIS — I429 Cardiomyopathy, unspecified: Secondary | ICD-10-CM | POA: Diagnosis not present

## 2018-07-13 DIAGNOSIS — I472 Ventricular tachycardia: Secondary | ICD-10-CM | POA: Diagnosis not present

## 2018-07-13 DIAGNOSIS — I5022 Chronic systolic (congestive) heart failure: Secondary | ICD-10-CM | POA: Diagnosis not present

## 2018-07-13 DIAGNOSIS — Z79899 Other long term (current) drug therapy: Secondary | ICD-10-CM | POA: Diagnosis not present

## 2018-07-13 DIAGNOSIS — N183 Chronic kidney disease, stage 3 (moderate): Secondary | ICD-10-CM | POA: Diagnosis not present

## 2018-07-13 MED ORDER — METOPROLOL SUCCINATE ER 25 MG PO TB24
25.0000 mg | ORAL_TABLET | Freq: Every day | ORAL | Status: DC
  Administered 2018-07-13: 25 mg via ORAL
  Filled 2018-07-13: qty 1

## 2018-07-13 MED ORDER — METOPROLOL SUCCINATE ER 25 MG PO TB24: 25.0000 mg | ORAL_TABLET | Freq: Every day | ORAL | 3 refills | Status: DC

## 2018-07-13 MED FILL — Gentamicin Sulfate Inj 40 MG/ML: INTRAMUSCULAR | Qty: 80 | Status: AC

## 2018-07-13 NOTE — Discharge Instructions (Signed)
Supplemental Discharge Instructions for  Pacemaker/Defibrillator Patients  Activity Do not raise your left/right arm above shoulder level or extend it backward beyond shoulder level for 2 weeks. Wear the arm sling as a reminder or as needed for comfort for 2 weeks. No heavy lifting or vigorous activity with your left/right arm for 6-8 weeks.    NO DRIVING is preferable for 2 weeks; If absolutely necessary, drive only short, familiar routes. DO wear your seatbelt, even if it crosses over the pacemaker site.  WOUND CARE - Keep the wound area clean and dry.  Remove the dressing the day after you return home (usually 48 hours after the procedure). - DO NOT SUBMERGE UNDER WATER UNTIL FULLY HEALED (no tub baths, hot tubs, swimming pools, etc.).  - You  may shower or take a sponge bath after the dressing is removed. DO NOT SOAK the area and do not allow the shower to directly spray on the site. - If you have staples, these will be removed in the office in 7-14 days. - If you have tape/steri-strips on your wound, these will fall off; do not pull them off prematurely.   - No bandage is needed on the site.  DO  NOT apply any creams, oils, or ointments to the wound area. - If you notice any drainage or discharge from the wound, any swelling, excessive redness or bruising at the site, or if you develop a fever > 101? F after you are discharged home, call the office at once.  Special Instructions - You are still able to use cellular telephones.  Avoid carrying your cellular phone near your device. - When traveling through airports, show security personnel your identification card to avoid being screened in the metal detectors.  - Avoid arc welding equipment, MRI testing (magnetic resonance imaging), TENS units (transcutaneous nerve stimulators).  Call the office for questions about other devices. - Avoid electrical appliances that are in poor condition or are not properly grounded. - Microwave ovens are  safe to be near or to operate.    Heart-Healthy Eating Plan Heart-healthy meal planning includes:  Limiting unhealthy fats.  Increasing healthy fats.  Making other small dietary changes.  You may need to talk with your doctor or a diet specialist (dietitian) to create an eating plan that is right for you. What types of fat should I choose?  Choose healthy fats. These include olive oil and canola oil, flaxseeds, walnuts, almonds, and seeds.  Eat more omega-3 fats. These include salmon, mackerel, sardines, tuna, flaxseed oil, and ground flaxseeds. Try to eat fish at least twice each week.  Limit saturated fats. ? Saturated fats are often found in animal products, such as meats, butter, and cream. ? Plant sources of saturated fats include palm oil, palm kernel oil, and coconut oil.  Avoid foods with partially hydrogenated oils in them. These include stick margarine, some tub margarines, cookies, crackers, and other baked goods. These contain trans fats. What general guidelines do I need to follow?  Check food labels carefully. Identify foods with trans fats or high amounts of saturated fat.  Fill one half of your plate with vegetables and green salads. Eat 4-5 servings of vegetables per day. A serving of vegetables is: ? 1 cup of raw leafy vegetables. ?  cup of raw or cooked cut-up vegetables. ?  cup of vegetable juice.  Fill one fourth of your plate with whole grains. Look for the word "whole" as the first word in the ingredient list.  Fill one fourth of your plate with lean protein foods.  Eat 4-5 servings of fruit per day. A serving of fruit is: ? One medium whole fruit. ?  cup of dried fruit. ?  cup of fresh, frozen, or canned fruit. ?  cup of 100% fruit juice.  Eat more foods that contain soluble fiber. These include apples, broccoli, carrots, beans, peas, and barley. Try to get 20-30 g of fiber per day.  Eat more home-cooked food. Eat less restaurant, buffet, and  fast food.  Limit or avoid alcohol.  Limit foods high in starch and sugar.  Avoid fried foods.  Avoid frying your food. Try baking, boiling, grilling, or broiling it instead. You can also reduce fat by: ? Removing the skin from poultry. ? Removing all visible fats from meats. ? Skimming the fat off of stews, soups, and gravies before serving them. ? Steaming vegetables in water or broth.  Lose weight if you are overweight.  Eat 4-5 servings of nuts, legumes, and seeds per week: ? One serving of dried beans or legumes equals  cup after being cooked. ? One serving of nuts equals 1 ounces. ? One serving of seeds equals  ounce or one tablespoon.  You may need to keep track of how much salt or sodium you eat. This is especially true if you have high blood pressure. Talk with your doctor or dietitian to get more information. What foods can I eat? Grains Breads, including [place], white, pita, wheat, raisin, rye, oatmeal, and [place]. Tortillas that are neither fried nor made with lard or trans fat. Low-fat rolls, including hotdog and hamburger buns and English muffins. Biscuits. Muffins. Waffles. Pancakes. Light popcorn. Whole-grain cereals. Flatbread. Melba toast. Pretzels. Breadsticks. Rusks. Low-fat snacks. Low-fat crackers, including oyster, saltine, matzo, graham, animal, and rye. Rice and pasta, including brown rice and pastas that are made with whole wheat. Vegetables All vegetables. Fruits All fruits, but limit coconut. Meats and Other Protein Sources Lean, well-trimmed beef, veal, pork, and lamb. Chicken and [place] without skin. All fish and shellfish. Wild duck, rabbit, pheasant, and venison. Egg whites or low-cholesterol egg substitutes. Dried beans, peas, lentils, and tofu. Seeds and most nuts. Dairy Low-fat or nonfat cheeses, including ricotta, string, and mozzarella. Skim or 1% milk that is liquid, powdered, or evaporated. Buttermilk that is made with low-fat milk. Nonfat or  low-fat yogurt. Beverages Mineral water. Diet carbonated beverages. Sweets and Desserts Sherbets and fruit ices. Honey, jam, marmalade, jelly, and syrups. Meringues and gelatins. Pure sugar candy, such as hard candy, jelly beans, gumdrops, mints, marshmallows, and small amounts of dark chocolate. [employer] Eat all sweets and desserts in moderation. Fats and Oils Nonhydrogenated (trans-free) margarines. Vegetable oils, including soybean, sesame, sunflower, olive, peanut, safflower, corn, canola, and cottonseed. Salad dressings or mayonnaise made with a vegetable oil. Limit added fats and oils that you use for cooking, baking, salads, and as spreads. Other Cocoa powder. Coffee and tea. All seasonings and condiments. The items listed above may not be a complete list of recommended foods or beverages. Contact your dietitian for more options. What foods are not recommended? Grains Breads that are made with saturated or trans fats, oils, or whole milk. Croissants. Butter rolls. Cheese breads. Sweet rolls. Donuts. Buttered popcorn. Chow mein noodles. High-fat crackers, such as cheese or butter crackers. Meats and Other Protein Sources Fatty meats, such as hotdogs, short ribs, sausage, spareribs, bacon, rib eye roast or steak, and mutton. High-fat deli meats, such as salami and  bologna. Caviar. Domestic duck and goose. Organ meats, such as kidney, liver, sweetbreads, and heart. Dairy Cream, sour cream, cream cheese, and creamed cottage cheese. Whole-milk cheeses, including blue (bleu), Monte[place] Jack, [place], [place], American, [place], Swiss, cheddar, [place], and [place]. Whole or 2% milk that is liquid, evaporated, or condensed. Whole buttermilk. Cream sauce or high-fat cheese sauce. Yogurt that is made from whole milk. Beverages Regular sodas and juice drinks with added sugar. Sweets and Desserts Frosting. Pudding. Cookies. Cakes other than angel food cake. Candy that has milk chocolate or  white chocolate, hydrogenated fat, butter, coconut, or unknown ingredients. Buttered syrups. Full-fat ice cream or ice cream drinks. Fats and Oils Gravy that has suet, meat fat, or shortening. Cocoa butter, hydrogenated oils, palm oil, coconut oil, palm kernel oil. These can often be found in baked products, candy, fried foods, nondairy creamers, and whipped toppings. Solid fats and shortenings, including bacon fat, salt pork, lard, and butter. Nondairy cream substitutes, such as coffee creamers and sour cream substitutes. Salad dressings that are made of unknown oils, cheese, or sour cream. The items listed above may not be a complete list of foods and beverages to avoid. Contact your dietitian for more information. This information is not intended to replace advice given to you by your health care provider. Make sure you discuss any questions you have with your health care provider. Document Released: 05/09/2012 Document Revised: 04/15/2016 Document Reviewed: 05/02/2014 Elsevier Interactive Patient Education  [employer].

## 2018-07-13 NOTE — Progress Notes (Signed)
   Progress Note  Patient Name: [name] Date of Encounter: 07/13/2018  Primary Cardiologist: No primary care provider on file.   Subjective   Feels well, not sore.  Inpatient Medications    Scheduled Meds: . amLODipine  10 mg Oral Daily  . cycloSPORINE  1 drop Both Eyes Daily  . dorzolamide-timolol  1 drop Both Eyes Daily  . isosorbide mononitrate  30 mg Oral Daily  . spironolactone  12.5 mg Oral Daily   Continuous Infusions: .  ceFAZolin (ANCEF) IV 1 g (07/13/18 0100)   PRN Meds: acetaminophen, albuterol, nitroGLYCERIN, ondansetron (ZOFRAN) IV   Vital Signs    Vitals:   07/12/18 1725 07/12/18 1822 07/12/18 2043 07/13/18 0437  BP: (!) 152/71 (!) 159/85 (!) 143/78 (!) 144/66  Pulse: (!) 56 61 67   Resp: 17  19   Temp:  97.6 F (36.4 C) 97.7 F (36.5 C) 98 F (36.7 C)  TempSrc:  Oral Oral Oral  SpO2: 98% 99% 99% 98%  Weight:    74 kg  Height:        Intake/Output Summary (Last 24 hours) at 07/13/2018 0708 Last data filed at 07/12/2018 1845 Gross per 24 hour  Intake 120 ml  Output 1100 ml  Net -980 ml   Filed Weights   07/12/18 1131 07/13/18 0437  Weight: 75.6 kg 74 kg    Telemetry    A sensed (sinus)/A paced, 100% V paced with very frequent PVCs (9%) - Personally Reviewed  ECG    Sinus with V pacing, frequent PVCs, A paced post PVC - Personally Reviewed  Physical Exam  Minimal oozing on dressing GEN: No acute distress.   Neck: No JVD Cardiac: RRR w ectopy, no murmurs, rubs, or gallops.  Respiratory: Clear to auscultation bilaterally. GI: Soft, nontender, non-distended  MS: No edema; No deformity. Neuro:  Nonfocal  Psych: Normal affect   Labs    Chemistry Recent Labs  Lab 07/10/18 1152  NA 144  K 4.0  CL 108*  CO2 21  GLUCOSE 129*  BUN 15  CREATININE 1.52*  CALCIUM 9.2  GFRNONAA 43*  GFRAA 50*     Hematology Recent Labs  Lab 07/10/18 1152  WBC 5.3  RBC 4.72  HGB 14.2  HCT 42.8  MCV 91  MCH 30.1  MCHC 33.2  RDW  12.2*  PLT 146*    Cardiac EnzymesNo results for input(s): TROPONINI in the last 168 hours. No results for input(s): TROPIPOC in the last 168 hours.   BNPNo results for input(s): BNP, PROBNP in the last 168 hours.   DDimer No results for input(s): DDIMER in the last 168 hours.   Radiology    On my review, appropriate lead position, no pneumothorax.  Cardiac Studies   P waves 4.3 mV, thres 0.5V@o .5 ms, imped 550 ohm R waves >12 mV,  thres 0.5V@o .5 ms, imped 580 ohm  Patient Profile     77 y.o. male with sinus bradycardia, second degree AV block, frequent PVCs and hx of NSVT s/p dual chamber pacemaker implantation without complications.  Assessment & Plan    DC home. Wound check 10-14 days. Add metoprolol. Has office visit w Dr. Claiborne Billings next month. I will see in 3 months. Wound care and activity restrictions discussed.  For questions or updates, please contact [place] Please consult [email or web address] for contact info under Cardiology/STEMI.      Signed, Sanda Klein, MD  07/13/2018, 7:08 AM

## 2018-07-13 NOTE — Discharge Summary (Signed)
Discharge Summary    Patient ID: [name],  MRN: [id number], DOB/AGE: 1941-04-02 77 y.o.  Admit date: 07/12/2018 Discharge date: 07/13/2018  Primary Care Provider: Lucianne Lei Primary Cardiologist: No primary care provider on file.  Discharge Diagnoses    Principal Problem:   Symptomatic bradycardia Active Problems:   Second degree AV block, Mobitz type I   Ventricular tachycardia (paroxysmal) (HCC)   Sick sinus syndrome [facility])   Pacemaker   Allergies No Known Allergies  Diagnostic Studies/Procedures    Pacemaker Implantation 07/12/18: Device details: Generator St[employer] Jude Assurity MRI model 2272 serial number [id number] Right atrial lead St[employer] Jude Tendril MRI model Q8494859, serial number [id number] Right ventricular lead [employer] Jude Tendril MRI model [id number] serial number [id number] _____________   History of Present Illness     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  ECG has shown evidence of both sinus bradycardia and second-degree AV block Mobitz type I.  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 Symptomatic bradycardia: 2/2 sinus node dysfunction and 2nd degree AV block, most likely Mobitz type 1, and IVCD. She presented for PPM placement which occurred 07/12/18. Patient tolerated the procedure well. Post-procedure CXR without PTX. Patient was started on metoprolol succinate 25mg  daily given CHF history.  - Wound check appointment scheduled for 9/3 and  follow-up with Dr. Sallyanne Kuster is scheduled for 11/27.   Remainder of home medications were continued. Patient to follow-up with Dr. Claiborne Billings routinely outpatient.   _____________  Discharge Vitals Blood pressure (!) 148/80, pulse 66, temperature (!) 97.4 F (36.3 C), temperature source Oral, resp. rate 19, height 5\' 8"  (1.727 m), weight 74 kg, SpO2 99 %.  Filed Weights   07/12/18 1131 07/13/18 0437  Weight: 75.6 kg 74 kg    Labs & Radiologic Studies    CBC Recent Labs    07/10/18 1152  WBC 5.3  HGB 14.2  HCT 42.8  MCV 91  PLT 956*   Basic Metabolic Panel Recent Labs    07/10/18 1152  NA 144  K 4.0  CL 108*  CO2 21  GLUCOSE 129*  BUN 15  CREATININE 1.52*  CALCIUM 9.2   Liver Function Tests No results for input(s): AST, ALT, ALKPHOS, BILITOT, PROT, ALBUMIN in the last 72 hours. No results for input(s): LIPASE, AMYLASE in the last 72 hours. Cardiac Enzymes No results for input(s): CKTOTAL, CKMB, CKMBINDEX, TROPONINI in the last 72 hours. BNP Invalid input(s): POCBNP D-Dimer No results for input(s): DDIMER in the last 72 hours. Hemoglobin A1C No results for input(s): HGBA1C in the last 72 hours. Fasting Lipid Panel No results for input(s): CHOL, HDL, LDLCALC, TRIG, CHOLHDL, LDLDIRECT in the last 72 hours. Thyroid Function Tests No results for input(s): TSH, T4TOTAL, T3FREE, THYROIDAB in the last 72 hours.  Invalid input(s): FREET3 _____________  Dg Chest 2 View  Result Date: 07/13/2018 CLINICAL DATA:  Pacemaker placement EXAM: CHEST - 2 VIEW COMPARISON:  01/23/2018 FINDINGS: Left subclavian dual lead pacemaker in good position. No pneumothorax. Chronic lung disease with prominent  lung markings in the bases. No acute infiltrate or effusion. Negative for heart failure. Chronic L1 fracture with surgical fixation. IMPRESSION: Satisfactory pacemaker placement.  No acute abnormality. Electronically Signed   By: Franchot Gallo M.D.   On: 07/13/2018 07:41   Disposition    Patient was seen and examined by Dr. Sallyanne Kuster who deemed patient as stable for discharge. Follow-up has been arranged. Discharge medications as listed below.   Follow-up Plans & Appointments    Follow-up Information    [facility] Follow up on 07/25/2018.   Specialty:  Cardiology Why:  Please arrive 15 minutes early for your 3:00pm wound check appointment Contact information: [address] [place] [phone]       Sanda Klein, MD Follow up on 10/18/2018.   Specialty:  Cardiology Why:  Please arrive 15 minutes early for your 11am appointment Contact information: [address] [place] [address] [phone]            Discharge Medications   Allergies as of 07/13/2018   No Known Allergies     Medication List    TAKE these medications   albuterol 108 (90 Base) MCG/ACT inhaler Commonly known as:  PROVENTIL HFA;VENTOLIN HFA Inhale 1 puff into the lungs every 6 (six) hours as needed for wheezing or shortness of breath.   amLODipine 10 MG tablet Commonly known as:  NORVASC Take 1 tablet (10 mg total) by mouth daily.   aspirin 325 MG tablet Take 325 mg by mouth daily.   dorzolamide-timolol 22.3-6.8 MG/ML ophthalmic solution Commonly known as:  COSOPT Place 1 drop into both eyes daily.   furosemide 20 MG tablet Commonly known as:  LASIX TAKE 1 TABLET(20 MG) BY MOUTH DAILY What changed:  See the new instructions.   isosorbide mononitrate 30 MG 24 hr tablet Commonly known as:  IMDUR TAKE 1 TABLET BY MOUTH DAILY...KEEP OFFICE VISIT What changed:  See the new instructions.   LUMIGAN 0.01 % Soln Generic drug:  bimatoprost Place 1 drop into both eyes at bedtime.   metoprolol succinate 25 MG 24 hr tablet Commonly known as:  TOPROL-XL Take 1 tablet (25 mg total) by mouth daily. Start taking on:  07/14/2018   nitroGLYCERIN 0.4 MG SL tablet Commonly known as:  NITROSTAT Place 1  tablet (0.4 mg total) under the tongue every 5 (five) minutes as needed for chest pain.   RESTASIS 0.05 % ophthalmic emulsion Generic drug:  cycloSPORINE Place 1 drop into both eyes daily.   spironolactone 25 MG tablet Commonly known as:  ALDACTONE Take 0.5 tablets (12.5 mg total) by mouth daily.          Outstanding Labs/Studies   Wound check scheduled for 07/25/18  Duration of Discharge Encounter   Greater than 30 minutes including physician time.  Signed, Abigail Butts PA-C 07/13/2018, 10:36 AM

## 2018-07-21 DIAGNOSIS — I459 Conduction disorder, unspecified: Secondary | ICD-10-CM | POA: Diagnosis not present

## 2018-07-21 DIAGNOSIS — H42 Glaucoma in diseases classified elsewhere: Secondary | ICD-10-CM | POA: Diagnosis not present

## 2018-07-21 DIAGNOSIS — M189 Osteoarthritis of first carpometacarpal joint, unspecified: Secondary | ICD-10-CM | POA: Diagnosis not present

## 2018-07-21 DIAGNOSIS — I10 Essential (primary) hypertension: Secondary | ICD-10-CM | POA: Diagnosis not present

## 2018-07-21 DIAGNOSIS — I4891 Unspecified atrial fibrillation: Secondary | ICD-10-CM | POA: Diagnosis not present

## 2018-07-25 ENCOUNTER — Ambulatory Visit (INDEPENDENT_AMBULATORY_CARE_PROVIDER_SITE_OTHER): Payer: Medicare Other | Admitting: *Deleted

## 2018-07-25 ENCOUNTER — Ambulatory Visit: Payer: Medicare Other | Admitting: Cardiovascular Disease

## 2018-07-25 DIAGNOSIS — Z950 Presence of cardiac pacemaker: Secondary | ICD-10-CM

## 2018-07-25 DIAGNOSIS — I440 Atrioventricular block, first degree: Secondary | ICD-10-CM | POA: Diagnosis not present

## 2018-07-25 DIAGNOSIS — I495 Sick sinus syndrome: Secondary | ICD-10-CM

## 2018-07-25 DIAGNOSIS — I441 Atrioventricular block, second degree: Secondary | ICD-10-CM

## 2018-07-25 LAB — CUP PACEART INCLINIC DEVICE CHECK
Battery Remaining Longevity: 74 mo
Battery Voltage: 3.05 V
Brady Statistic RA Percent Paced: 83 %
Implantable Lead Implant Date: 20190821
Implantable Lead Location: 753859
Implantable Lead Location: 753860
Implantable Pulse Generator Implant Date: 20190821
Lead Channel Pacing Threshold Amplitude: 0.75 V
Lead Channel Pacing Threshold Amplitude: 0.75 V
Lead Channel Pacing Threshold Amplitude: 0.75 V
Lead Channel Pacing Threshold Pulse Width: 0.5 ms
Lead Channel Pacing Threshold Pulse Width: 0.5 ms
Lead Channel Pacing Threshold Pulse Width: 0.5 ms
Lead Channel Sensing Intrinsic Amplitude: 12 mV
Lead Channel Sensing Intrinsic Amplitude: 4.4 mV
Lead Channel Setting Pacing Amplitude: 0.875
Lead Channel Setting Pacing Amplitude: 3.5 V
Lead Channel Setting Pacing Pulse Width: 0.5 ms
MDC IDC LEAD IMPLANT DT: 20190821
MDC IDC MSMT LEADCHNL RA IMPEDANCE VALUE: 512.5 Ohm
MDC IDC MSMT LEADCHNL RV IMPEDANCE VALUE: 425 Ohm
MDC IDC MSMT LEADCHNL RV PACING THRESHOLD AMPLITUDE: 0.75 V
MDC IDC MSMT LEADCHNL RV PACING THRESHOLD PULSEWIDTH: 0.5 ms
MDC IDC SESS DTM: 20190903172614
MDC IDC SET LEADCHNL RV SENSING SENSITIVITY: 2 mV
MDC IDC STAT BRADY RV PERCENT PACED: 96 %
Pulse Gen Serial Number: 9054359

## 2018-07-25 NOTE — Progress Notes (Signed)
I agree that is atrial fibrillation. First time in years of pacemaker monitoring. Will try to bring in sooner to discuss anticoagulation. Any options, [place]?

## 2018-07-25 NOTE — Progress Notes (Signed)
Wound check appointment. Staples removed. Wound without redness or edema. Incision edges approximated, wound well healed. Normal device function. Thresholds, sensing, and impedances consistent with implant measurements. Device programmed at 3.5V in RA and auto capture programmed on in RV for extra safety margin until 3 month visit. Histogram distribution appropriate for patient and level of activity. <1% AT/AF- 1 hour and 5 min AF on 07/17/18- patient asymptomatic, CHADSVASC 4, currently taking 325mg  ASA daily. No high ventricular rates noted. Patient educated about wound care, arm mobility, lifting restrictions and Merlin monitoring. ROV with [facility] 10/18/18.

## 2018-07-31 NOTE — Progress Notes (Signed)
Appt scheduled for 08/01/18 at 0820.

## 2018-08-01 ENCOUNTER — Encounter: Payer: Self-pay | Admitting: Cardiovascular Disease

## 2018-08-01 ENCOUNTER — Ambulatory Visit (INDEPENDENT_AMBULATORY_CARE_PROVIDER_SITE_OTHER): Payer: Medicare Other | Admitting: Cardiovascular Disease

## 2018-08-01 VITALS — BP 136/72 | HR 79 | Ht 68.0 in | Wt 168.2 lb

## 2018-08-01 DIAGNOSIS — I472 Ventricular tachycardia: Secondary | ICD-10-CM

## 2018-08-01 DIAGNOSIS — I10 Essential (primary) hypertension: Secondary | ICD-10-CM

## 2018-08-01 DIAGNOSIS — I441 Atrioventricular block, second degree: Secondary | ICD-10-CM

## 2018-08-01 DIAGNOSIS — I480 Paroxysmal atrial fibrillation: Secondary | ICD-10-CM | POA: Diagnosis not present

## 2018-08-01 DIAGNOSIS — N183 Chronic kidney disease, stage 3 unspecified: Secondary | ICD-10-CM

## 2018-08-01 DIAGNOSIS — I495 Sick sinus syndrome: Secondary | ICD-10-CM | POA: Diagnosis not present

## 2018-08-01 DIAGNOSIS — I4729 Other ventricular tachycardia: Secondary | ICD-10-CM

## 2018-08-01 DIAGNOSIS — I2510 Atherosclerotic heart disease of native coronary artery without angina pectoris: Secondary | ICD-10-CM

## 2018-08-01 NOTE — Progress Notes (Signed)
Cardiology Office Note:    Date:  08/02/2018   ID:  [name], DOB 1941/09/23, MRN [id number]  PCP:  Rodney Lei, MD  Cardiologist: [name] Referring MD: Rodney Lei, MD   Chief Complaint  Patient presents with  . Atrial Fibrillation    History of Present Illness:    [name] is a 77 y.o. male with a hx of dilated cardiomyopathy (EF as low as 20-25% in 2012), improved to normal ejection fraction (EF 50-55% echo May 2019), coronary artery disease with a non-STEMI 2012, nonobstructive CAD by cath (low risk nuclear stress test May 2016, no perfusion, runs of nonsustained VT, EF 51%), status post recent implantation of a dual-chamber permanent pacemaker for sinus bradycardia and second-degree AV block Mobitz type I (August 21, St. Jude Assurity).    The site has healed well and he has not had any complications from the pacemaker procedure.  However at his first device clinic checkup on August 2060 was found to have recorded a 1 hour episode of paroxysmal atrial fibrillation.  The episode was completely asymptomatic.  We checked his device today he has not had any new episodes of arrhythmia since then.  He has 82% atrial pacing and 97% ventricular pacing.  The patient specifically denies any chest pain at rest exertion, dyspnea at rest or with exertion, orthopnea, paroxysmal nocturnal dyspnea, syncope, palpitations, focal neurological deficits, intermittent claudication, lower extremity edema, unexplained weight gain, cough, hemoptysis or wheezing.   Past Medical History:  Diagnosis Date  . CAD (coronary artery disease) 80% stenosis diag of the LAD, 30% in OM2 branch of LCX in 2009    a. Nonobstructive CAD by cath 11/2011 with the exception of the pre-existing diagonal branch #2 lesion.  . Chronic systolic CHF (congestive heart failure) ([place])   . CKD (chronic kidney disease) stage 3, GFR 30-59 ml/min (HCC)   . Colon polyp, hyperplastic   . History of stress test 06/01/2012   Normal myocardial perfusion study. compared to the previous study there is no significant change. this is a low risk scan  . Hypertension   . Legally blind    "both eyes"  . Myocardial infarction ([place]) 11/22/11  . NICM (nonischemic cardiomyopathy) ([place])    a. Remote hx of dilated NICM with EF ranging 20-45%, including normal EF by echo (55-60%) in 2014.  Marland Kitchen Peripheral arterial disease ([place])    a. 06/2014: ABI right 0.99, left 1.2, LE dopplers revealing an occluded right posterior tibial. As symptoms were not felt r/t claudication, no further w/u at the time.  Marland Kitchen PVC's (premature ventricular contractions)   . 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 Requiring discontinuation of BB dose.  Marland Kitchen Spondylolisthesis   . Ventricular bigeminy   . Ventricular tachycardia (paroxysmal) ([place]) 04/11/2015    Past Surgical History:  Procedure Laterality Date  . BACK SURGERY    . CARDIAC CATHETERIZATION  11/2011  . CARDIAC CATHETERIZATION  11/2011   didn't demonstrate high grade obstructive disease to account for his LV dysfunction.  Marland Kitchen CATARACT EXTRACTION, BILATERAL  1990's  . CYSTOSCOPY    . CYSTOSCOPY WITH URETHRAL DILATATION N/A 05/04/2013   Procedure: CYSTOSCOPY WITH URETHRAL DILATATION;  Surgeon: Rodney Moore, MD;  Location: [place] [facility] NEURO ORS;  Service: Neurosurgery;  Laterality: N/A;  with insertion of foley catheter  . EP study and ablation of VT  7/13   PVC focus mapped to the right coronary cusp of the aorta, limited ablation performed due to proximity  of the focus to the right coronary artery  . EYE SURGERY    . LEFT HEART CATHETERIZATION WITH CORONARY ANGIOGRAM N/A 11/25/2011   Procedure: LEFT HEART CATHETERIZATION WITH CORONARY ANGIOGRAM;  Surgeon: Rodney Man, MD;  Location: [facility] CATH LAB;  Service: Cardiovascular;  Laterality: N/A;  . PACEMAKER IMPLANT N/A 07/12/2018   Procedure: PACEMAKER IMPLANT;  Surgeon: Rodney Klein, MD;  Location: [place] [facility] CV LAB;  Service: Cardiovascular;  Laterality:  N/A;  . POSTERIOR FUSION [place]  . ROTATOR CUFF REPAIR  2000's   left  . V-TACH ABLATION N/A 06/06/2012   Procedure: V-TACH ABLATION;  Surgeon: Rodney Grayer, MD;  Location: [facility] CATH LAB;  Service: Cardiovascular;  Laterality: N/A;    Current Medications: Current Meds  Medication Sig  . albuterol (PROVENTIL HFA;VENTOLIN HFA) 108 (90 Base) MCG/ACT inhaler Inhale 1 puff into the lungs every 6 (six) hours as needed for wheezing or shortness of breath.  Marland Kitchen amLODipine (NORVASC) 10 MG tablet Take 1 tablet (10 mg total) by mouth daily.  Marland Kitchen aspirin 325 MG tablet Take 325 mg by mouth daily.   Marland Kitchen BREO ELLIPTA 100-25 MCG/INH AEPB INL 1 PUFF PO QAM  . dorzolamide-timolol (COSOPT) 22.3-6.8 MG/ML ophthalmic solution Place 1 drop into both eyes daily.   . furosemide (LASIX) 20 MG tablet TAKE 1 TABLET(20 MG) BY MOUTH DAILY (Patient taking differently: Take 20 mg by mouth every other day. )  . isosorbide mononitrate (IMDUR) 30 MG 24 hr tablet TAKE 1 TABLET BY MOUTH DAILY...KEEP OFFICE VISIT (Patient taking differently: Take 30 mg by mouth daily. )  . LUMIGAN 0.01 % SOLN Place 1 drop into both eyes at bedtime.   . metoprolol succinate (TOPROL-XL) 25 MG 24 hr tablet Take 1 tablet (25 mg total) by mouth daily.  . nitroGLYCERIN (NITROSTAT) 0.4 MG SL tablet Place 1 tablet (0.4 mg total) under the tongue every 5 (five) minutes as needed for chest pain.  Marland Kitchen RESTASIS 0.05 % ophthalmic emulsion Place 1 drop into both eyes daily.  Marland Kitchen spironolactone (ALDACTONE) 25 MG tablet Take 0.5 tablets (12.5 mg total) by mouth daily.     Allergies:   Patient has no known allergies.   Social History   Socioeconomic History  . Marital status: Legally Separated    Spouse name: Not on file  . Number of children: 3  . Years of education: Not on file  . Highest education level: Not on file  Occupational History  . Occupation: Retired    Fish farm manager: RETIRED  Social Needs  . Financial resource strain: Not on file  . Food  insecurity:    Worry: Not on file    Inability: Not on file  . Transportation needs:    Medical: Not on file    Non-medical: Not on file  Tobacco Use  . Smoking status: Former Smoker    Years: 50.00    Types: Cigarettes    Last attempt to quit: 11/23/1999    Years since quitting: 18.7  . Smokeless tobacco: Never Used  Substance and Sexual Activity  . 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 Drug use: No  . Sexual activity: Yes  Lifestyle  . Physical activity:    Days per week: Not on file    Minutes per session: Not on file  . Stress: Not on file  Relationships  . Social connections:    Talks on phone: Not on  file    Gets together: Not on file    Attends religious service: Not on file    Active member of club or organization: Not on file    Attends meetings of clubs or organizations: Not on file    Relationship status: Not on file  Other Topics Concern  . Not on file  Social History Narrative  . Not on file     Family History: The patient's family history includes Asthma in his mother; Other in his unknown relative.  ROS:   Please see the history of present illness.     All other systems reviewed and are negative.  EKGs/Labs/Other Studies Reviewed:    The following studies were reviewed today: Notes from Dr. Claiborne Stevens, last echo and nuclear stress test.  EKG:  EKG is ordered today and shows AV sequential pacing  Recent Labs: 04/07/2018: ALT 16; Magnesium 1.9; TSH 1.270 07/10/2018: BUN 15; Creatinine, Ser 1.52; Hemoglobin 14.2; Platelets 146; Potassium 4.0; Sodium 144  Recent Lipid Panel    Component Value Date/Time   CHOL 111 01/24/2017 0950   TRIG 42 01/24/2017 0950   HDL 50 01/24/2017 0950   CHOLHDL 2.2 01/24/2017 0950   VLDL 8 01/24/2017 0950   LDLCALC 53 01/24/2017 0950    Physical Exam:    VS:  BP 136/72   Pulse 79   Ht 5\' 8"  (1.727 m)   Wt 168 lb 3.2 oz (76.3 kg)   BMI 25.57 kg/m     Wt  Readings from Last 3 Encounters:  08/01/18 168 lb 3.2 oz (76.3 kg)  07/13/18 163 lb 3.2 oz (74 kg)  05/30/18 173 lb (78.5 kg)     GEN:  Well nourished, well developed in no acute distress HEENT: Normal NECK: No JVD; No carotid bruits LYMPHATICS: No lymphadenopathy CARDIAC: RRR, no murmurs, rubs, gallops RESPIRATORY:  Clear to auscultation without rales, wheezing or rhonchi  ABDOMEN: Soft, non-tender, non-distended MUSCULOSKELETAL:  No edema; No deformity  SKIN: Warm and dry NEUROLOGIC:  Alert and oriented x 3 PSYCHIATRIC:  Normal affect   ASSESSMENT:    1. Paroxysmal atrial fibrillation (HCC)   2. Second degree atrioventricular block, Mobitz (type) I   3. Tachycardia-bradycardia syndrome ([place])   4. Coronary artery disease involving native coronary artery of native heart without angina pectoris   5. NSVT (nonsustained ventricular tachycardia) (HCC)   6. Essential hypertension   7. CKD (chronic kidney disease) stage 3, GFR 30-59 ml/min (HCC)    PLAN:    In order of problems listed above:  1. Paroxysmal atrial fibrillation: This was asymptomatic and is likely that he has had other events.  There is a small chance that this was related to some degree of post pacemaker implantation pericarditis, but he has no complaints that would suggest pericardial inflammation otherwise.  We discussed the fact that atrial fibrillation is likely to be recurrent and that it comes with a markedly increased risk for ischemic stroke.  He is reluctant to consider anticoagulation aced on the single asymptomatic event.  I reprogrammed his device to monitor for and report via Merlin, any episodes of atrial fibrillation lasting over 60 minutes.  If atrial fibrillation is reported again I would strongly recommend that he start treatment with a direct oral anticoagulant.   2. 2nd deg AVB: I suspect that he has a very high prevalence of ventricular pacing.  Both his nuclear test and his echo show ejection fraction  around 50%.  Option for upgrade to CRT if he  should develop any signs or symptoms of heart failure.  This is not the case at this time. 3. SSS: He also clearly has sinus node dysfunction.  He may require some adjustment to the rate response sensor based on the next couple of months 4. CAD: He does not have angina pectoris on long-acting nitrates.  Asymptomatic 5. NSVT: He has not had any episodes of nonsustained ventricular tachycardia since pacemaker implantation.  We did start beta-blockers as device was in place 6. HTN: Acceptable control, ideally systolic blood pressure would be less than 130.  No changes made to medicines. 7. CKD 3: creat 1.52, GFR 50.   Medication Adjustments/Labs and Tests Ordered: Current medicines are reviewed at length with the patient today.  Concerns regarding medicines are outlined above.  Orders Placed This Encounter  Procedures  . EKG 12-Lead   No orders of the defined types were placed in this encounter.   Patient Instructions  Please keep your appointment with Dr C scheduled for November 27th, 2019.    Signed, Rodney Klein, MD  08/02/2018 2:15 PM    [facility]

## 2018-08-01 NOTE — Patient Instructions (Addendum)
Please keep your appointment with Dr C scheduled for November 27th, 2019.

## 2018-08-02 ENCOUNTER — Encounter: Payer: Self-pay | Admitting: Cardiovascular Disease

## 2018-08-02 DIAGNOSIS — I480 Paroxysmal atrial fibrillation: Secondary | ICD-10-CM | POA: Insufficient documentation

## 2018-08-02 DIAGNOSIS — I472 Ventricular tachycardia: Secondary | ICD-10-CM | POA: Insufficient documentation

## 2018-08-02 DIAGNOSIS — I4729 Other ventricular tachycardia: Secondary | ICD-10-CM | POA: Insufficient documentation

## 2018-08-04 ENCOUNTER — Other Ambulatory Visit: Payer: Self-pay | Admitting: Cardiovascular Disease

## 2018-08-07 ENCOUNTER — Other Ambulatory Visit: Payer: Self-pay | Admitting: Cardiovascular Disease

## 2018-08-11 DIAGNOSIS — E785 Hyperlipidemia, unspecified: Secondary | ICD-10-CM | POA: Diagnosis not present

## 2018-08-11 DIAGNOSIS — Z0000 Encounter for general adult medical examination without abnormal findings: Secondary | ICD-10-CM | POA: Diagnosis not present

## 2018-08-11 DIAGNOSIS — I450 Right fascicular block: Secondary | ICD-10-CM | POA: Diagnosis not present

## 2018-08-11 DIAGNOSIS — I10 Essential (primary) hypertension: Secondary | ICD-10-CM | POA: Diagnosis not present

## 2018-08-11 DIAGNOSIS — I110 Hypertensive heart disease with heart failure: Secondary | ICD-10-CM | POA: Diagnosis not present

## 2018-09-03 ENCOUNTER — Other Ambulatory Visit: Payer: Self-pay | Admitting: Cardiovascular Disease

## 2018-09-04 ENCOUNTER — Other Ambulatory Visit: Payer: Self-pay | Admitting: Cardiovascular Disease

## 2018-09-25 DIAGNOSIS — H04123 Dry eye syndrome of bilateral lacrimal glands: Secondary | ICD-10-CM | POA: Diagnosis not present

## 2018-09-25 DIAGNOSIS — Z961 Presence of intraocular lens: Secondary | ICD-10-CM | POA: Diagnosis not present

## 2018-09-25 DIAGNOSIS — H401133 Primary open-angle glaucoma, bilateral, severe stage: Secondary | ICD-10-CM | POA: Diagnosis not present

## 2018-09-25 DIAGNOSIS — H40051 Ocular hypertension, right eye: Secondary | ICD-10-CM | POA: Diagnosis not present

## 2018-09-28 ENCOUNTER — Ambulatory Visit (INDEPENDENT_AMBULATORY_CARE_PROVIDER_SITE_OTHER): Payer: Medicare Other | Admitting: Podiatry

## 2018-09-28 ENCOUNTER — Ambulatory Visit: Payer: Medicare Other

## 2018-09-28 ENCOUNTER — Encounter: Payer: Self-pay | Admitting: Podiatry

## 2018-09-28 DIAGNOSIS — M79671 Pain in right foot: Secondary | ICD-10-CM

## 2018-09-28 DIAGNOSIS — M722 Plantar fascial fibromatosis: Secondary | ICD-10-CM

## 2018-09-28 DIAGNOSIS — M79674 Pain in right toe(s): Secondary | ICD-10-CM | POA: Diagnosis not present

## 2018-09-28 DIAGNOSIS — B351 Tinea unguium: Secondary | ICD-10-CM | POA: Diagnosis not present

## 2018-09-28 DIAGNOSIS — M79675 Pain in left toe(s): Secondary | ICD-10-CM | POA: Diagnosis not present

## 2018-09-28 NOTE — Progress Notes (Signed)
Subjective: [name]  Is seen  today for follow up of painful, mycotic toenails which interfere with daily activities and routine tasks.  Pain is aggravated when wearing enclosed shoe gear. Pain is getting progressively worse and relieved with periodic professional debridement.  [name] also relates heel pain to right heel for the past month which is also limiting his gait. He reports pain on first step in the morning and after periods of rest during the day. He has taken Tylenol for the pain. He says he walks for exercise, but the pain has prevented him from walking. He did purchase some OTC inserts for this shoes.  He relates he had a pacemaker placed approximately 2 months ago and his incision has healed.  Objective: Vascular Examination: Capillary refill time <3 seconds x 10 digits Dorsalis pedis palapble b/l Posterior tibial pulse 2/4 left and 1/4 right LE  No digital hair x 10 digits Skin temperature gradient WNL b/l  Dermatological Examination: Skin slightly atrophic dorsally b/l Toenails 1-5 b/l discolored, thick, dystrophic with subungual debris and pain with palpation to nailbeds due to thickness of nails.  Musculoskeletal: Muscle strength 5/5 to all LE muscle groups Pain on palpation to right medial tubercle of heel  Neurological: Sensation intact with 10 gram monofilament. Vibratory sensation intact.  Assessment: Painful onychomycosis toenails 1-5 b/l  Plantar fasciitis right foot  Plan: 1. Discussed plantar fasciitis and treatment options. Patient consented to local heel injection. Right heel prepped with betadine and local injection of 1cc of 0.5% marcaine plain and 1cc Celestone Soluspan administered to right heel. Bandaid placed at site of injection.  2. Xray right foot and discussed findings of Calcaneal spur. 3. Discussed our OTC Power Step Orthotics, but cost is relevant to this patient on today and orthotics were declined. Advised patient to change  shoe gear to [employer] or [employer] 4. Stretching exercises dispensed to the patient on today. 5. Toenails 1-5 b/l were debrided in length and girth without iatrogenic bleeding. 6. Patient to continue soft, supportive shoe gear 7. Patient to report any pedal injuries to medical professional immediately. 8. Follow up 2 weeks for plantar fasciitis. Patient/POA to call should there be a concern in the interim.

## 2018-09-28 NOTE — Patient Instructions (Addendum)
Plantar Fasciitis Rehab Ask your health care provider which exercises are safe for you. Do exercises exactly as told by your health care provider and adjust them as directed. It is normal to feel mild stretching, pulling, tightness, or discomfort as you do these exercises, but you should stop right away if you feel sudden pain or your pain gets worse. Do not begin these exercises until told by your health care provider. Stretching and range of motion exercises These exercises warm up your muscles and joints and improve the movement and flexibility of your foot. These exercises also help to relieve pain. Exercise A: Plantar fascia stretch  1. Sit with your left / right leg crossed over your opposite knee. 2. Hold your heel with one hand with that thumb near your arch. With your other hand, hold your toes and gently pull them back toward the top of your foot. You should feel a stretch on the bottom of your toes or your foot or both. 3. Hold this stretch for__________ seconds. 4. Slowly release your toes and return to the starting position. Repeat __________ times. Complete this exercise __________ times a day. Exercise B: Gastroc, standing  1. Stand with your hands against a wall. 2. Extend your left / right leg behind you, and bend your front knee slightly. 3. Keeping your heels on the floor and keeping your back knee straight, shift your weight toward the wall without arching your back. You should feel a gentle stretch in your left / right calf. 4. Hold this position for __________ seconds. Repeat __________ times. Complete this exercise __________ times a day. Exercise C: Soleus, standing 1. Stand with your hands against a wall. 2. Extend your left / right leg behind you, and bend your front knee slightly. 3. Keeping your heels on the floor, bend your back knee and slightly shift your weight over the back leg. You should feel a gentle stretch deep in your calf. 4. Hold this position for  __________ seconds. Repeat __________ times. Complete this exercise __________ times a day. Exercise D: Gastrocsoleus, standing 1. Stand with the ball of your left / right foot on a step. The ball of your foot is on the walking surface, right under your toes. 2. Keep your other foot firmly on the same step. 3. Hold onto the wall or a railing for balance. 4. Slowly lift your other foot, allowing your body weight to press your heel down over the edge of the step. You should feel a stretch in your left / right calf. 5. Hold this position for __________ seconds. 6. Return both feet to the step. 7. Repeat this exercise with a slight bend in your left / right knee. Repeat __________ times with your left / right knee straight and __________ times with your left / right knee bent. Complete this exercise __________ times a day. Balance exercise This exercise builds your balance and strength control of your arch to help take pressure off your plantar fascia. Exercise E: Single leg stand 1. Without shoes, stand near a railing or in a doorway. You may hold onto the railing or door frame as needed. 2. Stand on your left / right foot. Keep your big toe down on the floor and try to keep your arch lifted. Do not let your foot roll inward. 3. Hold this position for __________ seconds. 4. If this exercise is too easy, you can try it with your eyes closed or while standing on a pillow. Repeat __________ times. Complete  this exercise __________ times a day. This information is not intended to replace advice given to you by your health care provider. Make sure you discuss any questions you have with your health care provider. Document Released: 11/08/2005 Document Revised: 07/13/2016 Document Reviewed: 09/22/2015 Elsevier Interactive Patient Education  2018 [employer].  Plantar Fasciitis Plantar fasciitis is a painful foot condition that affects the heel. It occurs when the band of tissue that connects the  toes to the heel bone (plantar fascia) becomes irritated. This can happen after exercising too much or doing other repetitive activities (overuse injury). The pain from plantar fasciitis can range from mild irritation to severe pain that makes it difficult for you to walk or move. The pain is usually worse in the morning or after you have been sitting or lying down for a while. What are the causes? This condition may be caused by:  Standing for long periods of time.  Wearing shoes that do not fit.  Doing high-impact activities, including running, aerobics, and ballet.  Being overweight.  Having an abnormal way of walking (gait).  Having tight calf muscles.  Having high arches in your feet.  Starting a new athletic activity.  What are the signs or symptoms? The main symptom of this condition is heel pain. Other symptoms include:  Pain that gets worse after activity or exercise.  Pain that is worse in the morning or after resting.  Pain that goes away after you walk for a few minutes.  How is this diagnosed? This condition may be diagnosed based on your signs and symptoms. Your health care provider will also do a physical exam to check for:  A tender area on the bottom of your foot.  A high arch in your foot.  Pain when you move your foot.  Difficulty moving your foot.  You may also need to have imaging studies to confirm the diagnosis. These can include:  X-rays.  Ultrasound.  MRI.  How is this treated? Treatment for plantar fasciitis depends on the severity of the condition. Your treatment may include:  Rest, ice, and over-the-counter pain medicines to manage your pain.  Exercises to stretch your calves and your plantar fascia.  A splint that holds your foot in a stretched, upward position while you sleep (night splint).  Physical therapy to relieve symptoms and prevent problems in the future.  Cortisone injections to relieve severe pain.  Extracorporeal  shock wave therapy (ESWT) to stimulate damaged plantar fascia with electrical impulses. It is often used as a last resort before surgery.  Surgery, if other treatments have not worked after 12 months.  Follow these instructions at home:  Take medicines only as directed by your health care provider.  Avoid activities that cause pain.  Roll the bottom of your foot over a bag of ice or a bottle of cold water. Do this for 20 minutes, 3-4 times a day.  Perform simple stretches as directed by your health care provider.  Try wearing athletic shoes with air-sole or gel-sole cushions or soft shoe inserts.  Wear a night splint while sleeping, if directed by your health care provider.  Keep all follow-up appointments with your health care provider. How is this prevented?  Do not perform exercises or activities that cause heel pain.  Consider finding low-impact activities if you continue to have problems.  Lose weight if you need to. The best way to prevent plantar fasciitis is to avoid the activities that aggravate your plantar fascia. Contact a  health care provider if:  Your symptoms do not go away after treatment with home care measures.  Your pain gets worse.  Your pain affects your ability to move or do your daily activities. This information is not intended to replace advice given to you by your health care provider. Make sure you discuss any questions you have with your health care provider. Document Released: 08/03/2001 Document Revised: 04/12/2016 Document Reviewed: 09/18/2014 Elsevier Interactive Patient Education  2018 [place].    Fungal Nail Infection Fungal nail infection is a common fungal infection of the toenails or fingernails. This condition affects toenails more often than fingernails. More than one nail may be infected. The condition can be passed from person to person (is contagious). What are the causes? This condition is caused by a fungus. Several types of  funguses can cause the infection. These funguses are common in moist and warm areas. If your hands or feet come into contact with the fungus, it may get into a crack in your fingernail or toenail and cause the infection. What increases the risk? The following factors may make you more likely to develop this condition:  Being male.  Having diabetes.  Being of older age.  Living with someone who has the fungus.  Walking barefoot in areas where the fungus thrives, such as showers or locker rooms.  Having poor circulation.  Wearing shoes and socks that cause your feet to sweat.  Having athlete's foot.  Having a nail injury or history of a recent nail surgery.  Having psoriasis.  Having a weak body defense system (immune system).  What are the signs or symptoms? Symptoms of this condition include:  A pale spot on the nail.  Thickening of the nail.  A nail that becomes yellow or brown.  A brittle or ragged nail edge.  A crumbling nail.  A nail that has lifted away from the nail bed.  How is this diagnosed? This condition is diagnosed with a physical exam. Your health care provider may take a scraping or clipping from your nail to test for the fungus. How is this treated? Mild infections do not need treatment. If you have significant nail changes, treatment may include:  Oral antifungal medicines. You may need to take the medicine for several weeks or several months, and you may not see the results for a long time. These medicines can cause side effects. Ask your health care provider what problems to watch for.  Antifungal nail polish and nail cream. These may be used along with oral antifungal medicines.  Laser treatment of the nail.  Surgery to remove the nail. This may be needed for the most severe infections.  Treatment takes a long time, and the infection may come back. Follow these instructions at home: Medicines  Take or apply over-the-counter and  prescription medicines only as told by your health care provider.  Ask your health care provider about using over-the-counter mentholated ointment on your nails. Lifestyle   Do not share personal items, such as towels or nail clippers.  Trim your nails often.  Wash and dry your hands and feet every day.  Wear absorbent socks, and change your socks frequently.  Wear shoes that allow air to circulate, such as sandals or canvas tennis shoes. Throw out old shoes.  Wear rubber gloves if you are working with your hands in wet areas.  Do not walk barefoot in shower rooms or locker rooms.  Do not use a nail salon that does not  use clean instruments.  Do not use artificial nails. General instructions  Keep all follow-up visits as told by your health care provider. This is important.  Use antifungal foot powder on your feet and in your shoes. Contact a health care provider if: Your infection is not getting better or it is getting worse after several months. This information is not intended to replace advice given to you by your health care provider. Make sure you discuss any questions you have with your health care provider. Document Released: 11/05/2000 Document Revised: 04/15/2016 Document Reviewed: 05/12/2015 Elsevier Interactive Patient Education  2018 [employer].

## 2018-10-03 DIAGNOSIS — H401113 Primary open-angle glaucoma, right eye, severe stage: Secondary | ICD-10-CM | POA: Diagnosis not present

## 2018-10-11 DIAGNOSIS — E7800 Pure hypercholesterolemia, unspecified: Secondary | ICD-10-CM | POA: Diagnosis not present

## 2018-10-11 DIAGNOSIS — I4891 Unspecified atrial fibrillation: Secondary | ICD-10-CM | POA: Diagnosis not present

## 2018-10-11 DIAGNOSIS — I110 Hypertensive heart disease with heart failure: Secondary | ICD-10-CM | POA: Diagnosis not present

## 2018-10-11 DIAGNOSIS — I10 Essential (primary) hypertension: Secondary | ICD-10-CM | POA: Diagnosis not present

## 2018-10-12 ENCOUNTER — Ambulatory Visit (INDEPENDENT_AMBULATORY_CARE_PROVIDER_SITE_OTHER): Payer: Medicare Other | Admitting: Podiatry

## 2018-10-12 ENCOUNTER — Encounter: Payer: Self-pay | Admitting: Podiatry

## 2018-10-12 DIAGNOSIS — M722 Plantar fascial fibromatosis: Secondary | ICD-10-CM

## 2018-10-12 NOTE — Patient Instructions (Addendum)
PURCHASE A PAIR OF NEW BALANCE SNEAKERS 600 SERIES OR HIGHER    Plantar Fasciitis Plantar fasciitis is a painful foot condition that affects the heel. It occurs when the band of tissue that connects the toes to the heel bone (plantar fascia) becomes irritated. This can happen after exercising too much or doing other repetitive activities (overuse injury). The pain from plantar fasciitis can range from mild irritation to severe pain that makes it difficult for you to walk or move. The pain is usually worse in the morning or after you have been sitting or lying down for a while. What are the causes? This condition may be caused by:  Standing for long periods of time.  Wearing shoes that do not fit.  Doing high-impact activities, including running, aerobics, and ballet.  Being overweight.  Having an abnormal way of walking (gait).  Having tight calf muscles.  Having high arches in your feet.  Starting a new athletic activity.  What are the signs or symptoms? The main symptom of this condition is heel pain. Other symptoms include:  Pain that gets worse after activity or exercise.  Pain that is worse in the morning or after resting.  Pain that goes away after you walk for a few minutes.  How is this diagnosed? This condition may be diagnosed based on your signs and symptoms. Your health care provider will also do a physical exam to check for:  A tender area on the bottom of your foot.  A high arch in your foot.  Pain when you move your foot.  Difficulty moving your foot.  You may also need to have imaging studies to confirm the diagnosis. These can include:  X-rays.  Ultrasound.  MRI.  How is this treated? Treatment for plantar fasciitis depends on the severity of the condition. Your treatment may include:  Rest, ice, and over-the-counter pain medicines to manage your pain.  Exercises to stretch your calves and your plantar fascia.  A splint that holds your foot  in a stretched, upward position while you sleep (night splint).  Physical therapy to relieve symptoms and prevent problems in the future.  Cortisone injections to relieve severe pain.  Extracorporeal shock wave therapy (ESWT) to stimulate damaged plantar fascia with electrical impulses. It is often used as a last resort before surgery.  Surgery, if other treatments have not worked after 12 months.  Follow these instructions at home:  Take medicines only as directed by your health care provider.  Avoid activities that cause pain.  Roll the bottom of your foot over a bag of ice or a bottle of cold water. Do this for 20 minutes, 3-4 times a day.  Perform simple stretches as directed by your health care provider.  Try wearing athletic shoes with air-sole or gel-sole cushions or soft shoe inserts.  Wear a night splint while sleeping, if directed by your health care provider.  Keep all follow-up appointments with your health care provider. How is this prevented?  Do not perform exercises or activities that cause heel pain.  Consider finding low-impact activities if you continue to have problems.  Lose weight if you need to. The best way to prevent plantar fasciitis is to avoid the activities that aggravate your plantar fascia. Contact a health care provider if:  Your symptoms do not go away after treatment with home care measures.  Your pain gets worse.  Your pain affects your ability to move or do your daily activities. This information is not intended  to replace advice given to you by your health care provider. Make sure you discuss any questions you have with your health care provider. Document Released: 08/03/2001 Document Revised: 04/12/2016 Document Reviewed: 09/18/2014 Elsevier Interactive Patient Education  [employer].

## 2018-10-12 NOTE — Progress Notes (Signed)
Subjective: [name] presents today for two-week follow-up of plantar fasciitis of the right heel.  He states that his foot is still tender on today.  On his initial visit he states his pain was a 10 out of 10 on pain scale.  Today he says his pain is 6 out of 10.  He also states that he did not purchase a new balance sneakers that I recommended on last visit.  He states he has been doing his stretching exercises as instructed.  Objective: Neurovascular status is unchanged bilaterally. He still has some tenderness to the plantar medial tubercle of the right calcaneus.  Assessment: 1. Plantar fasciitis right foot, somewhat improved but still painful  Plan: 1.  Advised [name] to purchase new balance sneakers of the visit.  Number 2.. Dispensed plantar fascial brace for right foot with instructions. 3 he is to continue his stretching exercises 4.  Follow-up 4 weeks for reassessment.

## 2018-10-18 ENCOUNTER — Ambulatory Visit (INDEPENDENT_AMBULATORY_CARE_PROVIDER_SITE_OTHER): Payer: Medicare Other | Admitting: Cardiovascular Disease

## 2018-10-18 ENCOUNTER — Encounter: Payer: Self-pay | Admitting: Cardiovascular Disease

## 2018-10-18 VITALS — BP 150/78 | HR 101 | Ht 68.0 in | Wt 170.8 lb

## 2018-10-18 DIAGNOSIS — I480 Paroxysmal atrial fibrillation: Secondary | ICD-10-CM | POA: Diagnosis not present

## 2018-10-18 DIAGNOSIS — I472 Ventricular tachycardia: Secondary | ICD-10-CM

## 2018-10-18 DIAGNOSIS — I25118 Atherosclerotic heart disease of native coronary artery with other forms of angina pectoris: Secondary | ICD-10-CM | POA: Diagnosis not present

## 2018-10-18 DIAGNOSIS — I441 Atrioventricular block, second degree: Secondary | ICD-10-CM | POA: Diagnosis not present

## 2018-10-18 DIAGNOSIS — N183 Chronic kidney disease, stage 3 unspecified: Secondary | ICD-10-CM

## 2018-10-18 DIAGNOSIS — Z950 Presence of cardiac pacemaker: Secondary | ICD-10-CM | POA: Diagnosis not present

## 2018-10-18 DIAGNOSIS — I495 Sick sinus syndrome: Secondary | ICD-10-CM | POA: Diagnosis not present

## 2018-10-18 DIAGNOSIS — R001 Bradycardia, unspecified: Secondary | ICD-10-CM | POA: Diagnosis not present

## 2018-10-18 DIAGNOSIS — I10 Essential (primary) hypertension: Secondary | ICD-10-CM

## 2018-10-18 DIAGNOSIS — I4729 Other ventricular tachycardia: Secondary | ICD-10-CM

## 2018-10-18 NOTE — Patient Instructions (Signed)
Medication Instructions:  Dr Sallyanne Kuster recommends that you continue on your current medications as directed. Please refer to the Current Medication list given to you today.  If you need a refill on your cardiac medications before your next appointment, please call your pharmacy.   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 This monitoring reduces the number of office visits required to check your device to one time per year. It allows [place] to keep an eye on the functioning of your device to ensure it is working properly. You are scheduled for a device check from home on Wednesday, January 17, 2019. You may send your transmission at any time that day. If you have a wireless device, the transmission will be sent automatically. After your physician reviews your transmission, you will receive a postcard with your next transmission date.  Follow-Up: At [facility], you and your health needs are our priority.  As part of our continuing mission to provide you with exceptional heart care, we have created designated Provider Care Teams.  These Care Teams include your primary Cardiologist (physician) and Advanced Practice Providers (APPs -  Physician Assistants and Nurse Practitioners) who all work together to provide you with the care you need, when you need it. You will need a follow up appointment in 12 months.  Please call our office 2 months in advance to schedule this appointment.  You may see Sanda Klein, MD or one of the following Advanced Practice Providers on your designated Care Team: [place], [place] . Fabian Sharp, PA-C

## 2018-10-18 NOTE — Progress Notes (Addendum)
Cardiology Office Note:    Date:  10/18/2018   ID:  [name], DOB 1941/01/30, MRN [id number]  PCP:  Lucianne Lei, MD  Cardiologist: Claiborne Billings Referring MD: Lucianne Lei, MD   Chief Complaint  Patient presents with  . Pacemaker Check    History of Present Illness:    [name] is a 77 y.o. 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), single asymptomatic 1 hour episode of paroxysmal atrial fibrillation recorded by his device therapy after pacemaker implantation.    Since the single episode of atrial fibrillation he has not had any new sustained events, but has had a couple of brief episodes of nonsustained arrhythmia, lasting 16 seconds and 6 seconds respectively.  The episodes appear to be organized atrial tachycardia rather than atrial fibrillation.  Lead parameters remain excellent.  Battery is beginning of life.  Appetite just been reduced to chronic levels.  He has 87% atrial pacing and 98% ventricular pacing.  The underlying rhythm is sinus bradycardia 49 bpm with Mobitz type I Wenckebach AV block.  The patient specifically denies any chest pain at rest exertion, dyspnea at rest or with exertion, orthopnea, paroxysmal nocturnal dyspnea, syncope, palpitations, focal neurological deficits, intermittent claudication, lower extremity edema, unexplained weight gain, cough, hemoptysis or wheezing.    Past Medical History:  Diagnosis Date  . CAD (coronary artery disease) 80% stenosis diag of the LAD, 30% in OM2 branch of LCX in 2009    a. Nonobstructive CAD by cath  11/2011 with the exception of the pre-existing diagonal branch #2 lesion.  . Chronic systolic CHF (congestive heart failure) ([place])   . CKD (chronic kidney disease) stage 3, GFR 30-59 ml/min (HCC)   . Colon polyp, hyperplastic   . History of stress test 06/01/2012   Normal myocardial perfusion study. compared to the previous study there is no significant change. this is a low risk scan  . Hypertension   . Legally blind    "both eyes"  . Myocardial infarction ([place]) 11/22/11  . NICM (nonischemic cardiomyopathy) ([place])    a. Remote hx of dilated NICM with EF ranging 20-45%, including normal EF by echo (55-60%) in 2014.  Marland Kitchen Peripheral arterial disease ([place])    a. 06/2014: ABI right 0.99, left 1.2, LE dopplers revealing an occluded right posterior tibial. As symptoms were not felt r/t claudication, no further w/u at the time.  Marland Kitchen PVC's (premature ventricular contractions)   . 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 Requiring discontinuation of BB dose.  Marland Kitchen Spondylolisthesis   . Ventricular bigeminy   . Ventricular tachycardia (paroxysmal) ([place]) 04/11/2015    Past Surgical History:  Procedure Laterality Date  . BACK SURGERY    . CARDIAC CATHETERIZATION  11/2011  . CARDIAC CATHETERIZATION  11/2011   didn't demonstrate high grade obstructive disease to account for his LV dysfunction.  Marland Kitchen CATARACT EXTRACTION, BILATERAL  1990's  . CYSTOSCOPY    . CYSTOSCOPY WITH URETHRAL DILATATION N/A 05/04/2013   Procedure: CYSTOSCOPY WITH URETHRAL DILATATION;  Surgeon: Eustace Moore, MD;  Location: [facility];  Service: Neurosurgery;  Laterality: N/A;  with insertion of foley catheter  . EP study and ablation of VT  7/13   PVC focus mapped to the right coronary cusp of the aorta, limited ablation performed due to proximity of the focus to the right coronary artery  . EYE SURGERY    . LEFT HEART CATHETERIZATION WITH CORONARY ANGIOGRAM N/A 11/25/2011   Procedure: LEFT HEART CATHETERIZATION WITH CORONARY  ANGIOGRAM;  Surgeon: Leonie Man, MD;  Location: [facility] CATH LAB;  Service: Cardiovascular;  Laterality: N/A;  . PACEMAKER IMPLANT N/A 07/12/2018   Procedure: PACEMAKER IMPLANT;  Surgeon: Sanda Klein, MD;  Location: [facility] CV LAB;  Service: Cardiovascular;  Laterality: N/A;  . POSTERIOR FUSION [place]  . ROTATOR CUFF REPAIR  2000's   left  . V-TACH ABLATION N/A 06/06/2012   Procedure: V-TACH ABLATION;  Surgeon: Thompson Grayer, MD;  Location: [facility] CATH LAB;  Service: Cardiovascular;  Laterality: N/A;    Current Medications: Current Meds  Medication Sig  . albuterol (PROVENTIL HFA;VENTOLIN HFA) 108 (90 Base) MCG/ACT inhaler Inhale 1 puff into the lungs every 6 (six) hours as needed for wheezing or shortness of breath.  Marland Kitchen amLODipine (NORVASC) 10 MG tablet Take 1 tablet (10 mg total) by mouth daily.  Marland Kitchen aspirin 325 MG tablet Take 81 mg by mouth daily.  Marland Kitchen BREO ELLIPTA 100-25 MCG/INH AEPB INL 1 PUFF PO QAM  . dorzolamide-timolol (COSOPT) 22.3-6.8 MG/ML ophthalmic solution Place 1 drop into both eyes daily.   Marland Kitchen FLUAD 0.5 ML SUSY ADMINISTER 0.5ML IN THE MUSCLE AS DIRECTED  . furosemide (LASIX) 20 MG tablet TAKE 1 TABLET(20 MG) BY MOUTH DAILY  . isosorbide mononitrate (IMDUR) 30 MG 24 hr tablet Take 1 tablet (30 mg total) by mouth daily.  Marland Kitchen LUMIGAN 0.01 % SOLN Place 1 drop into both eyes at bedtime.   . metoprolol succinate (TOPROL-XL) 25 MG 24 hr tablet Take 1 tablet (25 mg total) by mouth daily.  . nitroGLYCERIN (NITROSTAT) 0.4 MG SL tablet Place 1 tablet (0.4 mg total) under the tongue every 5 (five) minutes as needed for chest pain.  Marland Kitchen RESTASIS 0.05 % ophthalmic emulsion Place 1 drop into both eyes daily.  Marland Kitchen spironolactone (ALDACTONE) 25 MG tablet TAKE 1/2 TABLET BY MOUTH TWICE DAILY, KEEP OFFICE VISIT     Allergies:   Patient has no known allergies.   Social History   Socioeconomic History  . Marital status: Legally Separated    Spouse name: Not on file  . Number of  children: 3  . Years of education: Not on file  . Highest education level: Not on file  Occupational History  . Occupation: Retired    Fish farm manager: RETIRED  Social Needs  . Financial resource strain: Not on file  . Food insecurity:    Worry: Not on file    Inability: Not on file  . Transportation needs:    Medical: Not on file    Non-medical: Not on file  Tobacco Use  . Smoking status: Former Smoker    Years: 50.00    Types: Cigarettes    Last attempt to quit: 11/23/1999    Years since quitting: 18.9  . Smokeless tobacco: Never Used  Substance and Sexual Activity  . 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 Drug use: No  . Sexual activity: Yes  Lifestyle  . Physical activity:    Days per week: Not on file  Minutes per session: Not on file  . Stress: Not on file  Relationships  . Social connections:    Talks on phone: Not on file    Gets together: Not on file    Attends religious service: Not on file    Active member of club or organization: Not on file    Attends meetings of clubs or organizations: Not on file    Relationship status: Not on file  Other Topics Concern  . Not on file  Social History Narrative  . Not on file     Family History: The patient's family history includes Asthma in his mother; Other in his unknown relative.  ROS:   Please see the history of present illness.    All other systems are reviewed and are negative  EKGs/Labs/Other Studies Reviewed:    The following studies were reviewed today: Comprehensive pacemaker check  EKG:  EKG is ordered today.  It shows AV sequential pacing  Recent Labs: 04/07/2018: ALT 16; Magnesium 1.9; TSH 1.270 07/10/2018: BUN 15; Creatinine, Ser 1.52; Hemoglobin 14.2; Platelets 146; Potassium 4.0; Sodium 144  Recent Lipid Panel    Component Value Date/Time   CHOL 111 01/24/2017 0950   TRIG 42 01/24/2017 0950   HDL 50 01/24/2017 0950   CHOLHDL 2.2  01/24/2017 0950   VLDL 8 01/24/2017 0950   LDLCALC 53 01/24/2017 0950    Physical Exam:    VS:  BP (!) 150/78   Pulse (!) 101   Ht 5\' 8"  (1.727 m)   Wt 170 lb 12.8 oz (77.5 kg)   BMI 25.97 kg/m    10 minutes later the blood pressure was 136/72, his Hg and the heart rate was 82 bpm Wt Readings from Last 3 Encounters:  10/18/18 170 lb 12.8 oz (77.5 kg)  08/01/18 168 lb 3.2 oz (76.3 kg)  07/13/18 163 lb 3.2 oz (74 kg)     General: Alert, oriented x3, no distress, well-healed left subclavian pacemaker site Head: no evidence of trauma, PERRL, EOMI, no exophtalmos or lid lag, no myxedema, no xanthelasma; normal ears, nose and oropharynx Neck: normal jugular venous pulsations and no hepatojugular reflux; brisk carotid pulses without delay and no carotid bruits Chest: clear to auscultation, no signs of consolidation by percussion or palpation, normal fremitus, symmetrical and full respiratory excursions Cardiovascular: normal position and quality of the apical impulse, regular rhythm, normal first and paradoxically split second heart sounds, no murmurs, rubs or gallops Abdomen: no tenderness or distention, no masses by palpation, no abnormal pulsatility or arterial bruits, normal bowel sounds, no hepatosplenomegaly Extremities: no clubbing, cyanosis or edema; 2+ radial, ulnar and brachial pulses bilaterally; 2+ right femoral, posterior tibial and dorsalis pedis pulses; 2+ left femoral, posterior tibial and dorsalis pedis pulses; no subclavian or femoral bruits Neurological: grossly nonfocal Psych: Normal mood and affect   ASSESSMENT:    1. Paroxysmal atrial fibrillation (HCC)   2. Second degree AV block, Mobitz type I   3. SSS (sick sinus syndrome) ([place])   4. Coronary artery disease of native artery of native heart with stable angina pectoris ([place])   5. Pacemaker   6. NSVT (nonsustained ventricular tachycardia) (HCC)   7. Essential hypertension   8. CKD (chronic kidney disease) stage  3, GFR 30-59 ml/min (HCC)    PLAN:    In order of problems listed above:  1. Paroxysmal atrial fibrillation: Only one episode has been recorded, shortly following device implantation.  Since then he has had 2 very brief  episodes of paroxysmal atrial tachycardia, but no atrial fibrillation.  As before he would rather not take anticoagulants, but understands that if we detect sustained atrial fibrillation again I will be calling him to initiate oral anticoagulant therapy..   2. 2nd deg AVB: As expected, he has almost 100% ventricular pacing.  Fortunately this has not been associated with any heart failure symptoms.  Both his nuclear test and his echo show ejection fraction around 50%.   3. PPM: Normal device function.  Option for upgrade to CRT if he should develop any signs or symptoms of heart failure in the future. 4. SSS: He also clearly has sinus node dysfunction.  At this point I think is premature to adjust his rate response sensor.  Heart rate histograms a little bit blunted, but not unacceptable.  He does not have symptoms of excessive fatigue or exercise intolerance. 5. CAD: Asymptomatic on long-acting nitrates, calcium channel blocker, beta-blocker.  Currently not on statin, I think this may be an error on his list.  He has been on pravastatin for years.  Asked him to check his medicine bottles at home.  His most recent lipid profile showed an LDL cholesterol 53.  Should be on low-dose aspirin 81 mg daily, not a full 325 mg. 6. NSVT: None detected since pacemaker implantation. 7. HTN: Acceptable control, ideally systolic blood pressure would be less than 130.  No changes made to medicines. 8. CKD 3: seems to be stable (creat 1.52, GFR 50).   We will make sure he has an appointment to see Dr. Claiborne Billings for his routine cardiac care.  He was last seen in July 2019.  I will see his remote downloads every 3 months and have him back in the office in 12 months.   Medication Adjustments/Labs and  Tests Ordered: Current medicines are reviewed at length with the patient today.  Concerns regarding medicines are outlined above.  No orders of the defined types were placed in this encounter.  No orders of the defined types were placed in this encounter.   Patient Instructions  Medication Instructions:  Dr Sallyanne Kuster recommends that you continue on your current medications as directed. Please refer to the Current Medication list given to you today.  If you need a refill on your cardiac medications before your next appointment, please call your pharmacy.   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 This monitoring reduces the number of office visits required to check your device to one time per year. It allows [place] to keep an eye on the functioning of your device to ensure it is working properly. You are scheduled for a device check from home on Wednesday, January 17, 2019. You may send your transmission at any time that day. If you have a wireless device, the transmission will be sent automatically. After your physician reviews your transmission, you will receive a postcard with your next transmission date.  Follow-Up: At [facility] you and your health needs are our priority.  As part of our continuing mission to provide you with exceptional heart care, we have created designated Provider Care Teams.  These Care Teams include your primary Cardiologist (physician) and Advanced Practice Providers (APPs -  Physician Assistants and Nurse Practitioners) who all work together to provide you with the care you need, when you need it. You will need a follow up appointment in 12 months.  Please call our office 2 months in advance to schedule this appointment.  You  may see Sanda Klein, MD or one of the following Advanced Practice Providers on your designated Care Team: [place], [place] . Fabian Sharp, PA-C    Signed, Sanda Klein, MD  10/18/2018 1:43  PM    [place]

## 2018-10-24 NOTE — Addendum Note (Signed)
Addended by: Zebedee Iba on: 10/24/2018 09:25 AM   Modules accepted: Orders

## 2018-11-02 ENCOUNTER — Other Ambulatory Visit (HOSPITAL_COMMUNITY): Payer: Self-pay | Admitting: Medical

## 2018-11-09 ENCOUNTER — Encounter: Payer: Self-pay | Admitting: Podiatry

## 2018-11-09 ENCOUNTER — Ambulatory Visit (INDEPENDENT_AMBULATORY_CARE_PROVIDER_SITE_OTHER): Payer: Medicare Other | Admitting: Podiatry

## 2018-11-09 DIAGNOSIS — M79671 Pain in right foot: Secondary | ICD-10-CM

## 2018-11-09 DIAGNOSIS — M722 Plantar fascial fibromatosis: Secondary | ICD-10-CM | POA: Diagnosis not present

## 2018-11-09 DIAGNOSIS — M7731 Calcaneal spur, right foot: Secondary | ICD-10-CM

## 2018-11-09 NOTE — Patient Instructions (Signed)
Plantar Fasciitis      Plantar fasciitis is a painful foot condition that affects the heel. It occurs when the band of tissue that connects the toes to the heel bone (plantar fascia) becomes irritated. This can happen as the result of exercising too much or doing other repetitive activities (overuse injury).  The pain from plantar fasciitis can range from mild irritation to severe pain that makes it difficult to walk or move. The pain is usually worse in the morning after sleeping, or after sitting or lying down for a while. Pain may also be worse after long periods of walking or standing.  What are the causes?  This condition may be caused by:  Standing for long periods of time.  Wearing shoes that do not have good arch support.  Doing activities that put stress on joints (high-impact activities), including running, aerobics, and ballet.  Being overweight.  An abnormal way of walking (gait).  Tight muscles in the back of your lower leg (calf).  High arches in your feet.  Starting a new athletic activity.  What are the signs or symptoms?  The main symptom of this condition is heel pain. Pain may:  Be worse with first steps after a time of rest, especially in the morning after sleeping or after you have been sitting or lying down for a while.  Be worse after long periods of standing still.  Decrease after 30-45 minutes of activity, such as gentle walking.  How is this diagnosed?  This condition may be diagnosed based on your medical history and your symptoms. Your health care provider may ask questions about your activity level. Your health care provider will do a physical exam to check for:  A tender area on the bottom of your foot.  A high arch in your foot.  Pain when you move your foot.  Difficulty moving your foot.  You may have imaging tests to confirm the diagnosis, such as:  X-rays.  Ultrasound.  MRI.  How is this treated?  Treatment for plantar fasciitis depends on how severe your condition is.  Treatment may include:  Rest, ice, applying pressure (compression), and raising the affected foot (elevation). This may be called RICE therapy. Your health care provider may recommend RICE therapy along with over-the-counter pain medicines to manage your pain.  Exercises to stretch your calves and your plantar fascia.  A splint that holds your foot in a stretched, upward position while you sleep (night splint).  Physical therapy to relieve symptoms and prevent problems in the future.  Injections of steroid medicine (cortisone) to relieve pain and inflammation.  Stimulating your plantar fascia with electrical impulses (extracorporeal shock wave therapy). This is usually the last treatment option before surgery.  Surgery, if other treatments have not worked after 12 months.  Follow these instructions at home:      Managing pain, stiffness, and swelling  If directed, put ice on the painful area:  Put ice in a plastic bag, or use a frozen bottle of water.  Place a towel between your skin and the bag or bottle.  Roll the bottom of your foot over the bag or bottle.  Do this for 20 minutes, 2-3 times a day.  Wear athletic shoes that have air-sole or gel-sole cushions, or try wearing soft shoe inserts that are designed for plantar fasciitis.  Raise (elevate) your foot above the level of your heart while you are sitting or lying down.  Activity  Avoid activities that cause pain.   Ask your health care provider what activities are safe for you.  Do physical therapy exercises and stretches as told by your health care provider.  Try activities and forms of exercise that are easier on your joints (low-impact). Examples include swimming, water aerobics, and biking.  General instructions  Take over-the-counter and prescription medicines only as told by your health care provider.  Wear a night splint while sleeping, if told by your health care provider. Loosen the splint if your toes tingle, become numb, or turn cold and  blue.  Maintain a healthy weight, or work with your health care provider to lose weight as needed.  Keep all follow-up visits as told by your health care provider. This is important.  Contact a health care provider if you:  Have symptoms that do not go away after caring for yourself at home.  Have pain that gets worse.  Have pain that affects your ability to move or do your daily activities.  Summary  Plantar fasciitis is a painful foot condition that affects the heel. It occurs when the band of tissue that connects the toes to the heel bone (plantar fascia) becomes irritated.  The main symptom of this condition is heel pain that may be worse after exercising too much or standing still for a long time.  Treatment varies, but it usually starts with rest, ice, compression, and elevation (RICE therapy) and over-the-counter medicines to manage pain.  This information is not intended to replace advice given to you by your health care provider. Make sure you discuss any questions you have with your health care provider.  Document Released: 08/03/2001 Document Revised: 09/05/2017 Document Reviewed: 09/05/2017  Elsevier Interactive Patient Education © 2019 [employer]

## 2018-11-09 NOTE — Progress Notes (Signed)
SUBJECTIVE: [name] presents to clinic on today for follow up plantar fasciitis of the right heel.  Treatments on last visit include plantar fascial brace and instructions to purchase New Balance Sneakers.   He relates he did go and purchase New Balance sneakers right after his last visit.  He notes resolution of pain symptoms and is very pleased.  He continues to wear his plantar fascial brace as well. Today, pain level is zero on a scale of 1-10.  OBJECTIVE:  Neurovascular status unchanged.   Musculoskeletal: No pain on palpation noted medial tubercle right heel.   ASSESSMENT: 1. Plantar fasciitis right foot, resolved. 2. Heel spur right foot 3. Pain in foot right foot  PLAN: 1. Patient examined today. 2. Patient's symptoms have resolved. 3. Discussed if he wears sneakers everyday, he may need to change them twice yearly. 4. Keep regularly scheduled appointment for painful mycotic toenails.

## 2018-11-20 DIAGNOSIS — H40051 Ocular hypertension, right eye: Secondary | ICD-10-CM | POA: Diagnosis not present

## 2018-11-20 DIAGNOSIS — H401133 Primary open-angle glaucoma, bilateral, severe stage: Secondary | ICD-10-CM | POA: Diagnosis not present

## 2018-11-21 ENCOUNTER — Other Ambulatory Visit: Payer: Self-pay | Admitting: Cardiovascular Disease

## 2018-12-18 LAB — CUP PACEART INCLINIC DEVICE CHECK
Date Time Interrogation Session: 20200127144606
Implantable Lead Implant Date: 20190821
Implantable Lead Implant Date: 20190821
Implantable Lead Location: 753859
Implantable Lead Location: 753860
Implantable Pulse Generator Implant Date: 20190821
MDC IDC PG SERIAL: 9054359
Pulse Gen Model: 2272

## 2018-12-28 ENCOUNTER — Encounter: Payer: Self-pay | Admitting: Podiatry

## 2018-12-28 ENCOUNTER — Ambulatory Visit (INDEPENDENT_AMBULATORY_CARE_PROVIDER_SITE_OTHER): Payer: Medicare Other | Admitting: Podiatry

## 2018-12-28 DIAGNOSIS — B351 Tinea unguium: Secondary | ICD-10-CM | POA: Diagnosis not present

## 2018-12-28 DIAGNOSIS — M79675 Pain in left toe(s): Secondary | ICD-10-CM | POA: Diagnosis not present

## 2018-12-28 DIAGNOSIS — M79674 Pain in right toe(s): Secondary | ICD-10-CM | POA: Diagnosis not present

## 2018-12-28 NOTE — Progress Notes (Signed)
Subjective: [name]  Is seen  today for follow up of painful, mycotic toenails which interfere with daily activities and routine tasks.  Pain is aggravated when wearing enclosed shoe gear. Pain is getting progressively worse and relieved with periodic professional debridement.  [name] also relates his heel pain is much better with use of plantar fascial brace and his New Balance sneakers.    ROS:  Pacemaker 2019  Objective: Vascular Examination: Capillary refill time <3 seconds x 10 digits Dorsalis pedis palapble b/l Posterior tibial pulse 2/4 left and 1/4 right LE  No digital hair x 10 digits Skin temperature gradient WNL b/l  Dermatological Examination: Skin slightly atrophic dorsally b/l  Toenails 1-5 b/l discolored, thick, dystrophic with subungual debris and pain with palpation to nailbeds due to thickness of nails.  Musculoskeletal: Muscle strength 5/5 to all LE muscle groups  No tenderness on palpation to right medial tubercle of heel  Neurological: Sensation intact with 10 gram monofilament.  Vibratory sensation intact.   Assessment: Painful onychomycosis toenails 1-5 b/l  Plantar fasciitis right foot, doing well  Plan: 1. Continue use of plantar fascial brace right foot and New Balance Sneakers right foot. 2. Continue stretching exercises dispensed to the patient on today. 3. Toenails 1-5 b/l were debrided in length and girth without iatrogenic bleeding. 4. Patient to continue soft, supportive shoe gear 5. Patient to report any pedal injuries to medical professional immediately. 6. Follow up 3 months.  7. Patient/POA to call should there be a concern in the interim.

## 2018-12-28 NOTE — Patient Instructions (Signed)
Plantar Fasciitis (Heel Spur Syndrome) with Rehab The plantar fascia is a fibrous, ligament-like, soft-tissue structure that spans the bottom of the foot. Plantar fasciitis is a condition that causes pain in the foot due to inflammation of the tissue. SYMPTOMS   Pain and tenderness on the underneath side of the foot.  Pain that worsens with standing or walking. CAUSES  Plantar fasciitis is caused by irritation and injury to the plantar fascia on the underneath side of the foot. Common mechanisms of injury include:  Direct trauma to bottom of the foot.  Damage to a small nerve that runs under the foot where the main fascia attaches to the heel bone.  Stress placed on the plantar fascia due to bone spurs. RISK INCREASES WITH:   Activities that place stress on the plantar fascia (running, jumping, pivoting, or cutting).  Poor strength and flexibility.  Improperly fitted shoes.  Tight calf muscles.  Flat feet.  Failure to warm-up properly before activity.  Obesity. PREVENTION  Warm up and stretch properly before activity.  Allow for adequate recovery between workouts.  Maintain physical fitness:  Strength, flexibility, and endurance.  Cardiovascular fitness.  Maintain a health body weight.  Avoid stress on the plantar fascia.  Wear properly fitted shoes, including arch supports for individuals who have flat feet.  PROGNOSIS  If treated properly, then the symptoms of plantar fasciitis usually resolve without surgery. However, occasionally surgery is necessary.  RELATED COMPLICATIONS   Recurrent symptoms that may result in a chronic condition.  Problems of the lower back that are caused by compensating for the injury, such as limping.  Pain or weakness of the foot during push-off following surgery.  Chronic inflammation, scarring, and partial or complete fascia tear, occurring more often from repeated injections.  TREATMENT  Treatment initially involves the  use of ice and medication to help reduce pain and inflammation. The use of strengthening and stretching exercises may help reduce pain with activity, especially stretches of the Achilles tendon. These exercises may be performed at home or with a therapist. Your caregiver may recommend that you use heel cups of arch supports to help reduce stress on the plantar fascia. Occasionally, corticosteroid injections are given to reduce inflammation. If symptoms persist for greater than 6 months despite non-surgical (conservative), then surgery may be recommended.   MEDICATION   If pain medication is necessary, then nonsteroidal anti-inflammatory medications, such as aspirin and ibuprofen, or other minor pain relievers, such as acetaminophen, are often recommended.  Do not take pain medication within 7 days before surgery.  Prescription pain relievers may be given if deemed necessary by your caregiver. Use only as directed and only as much as you need.  Corticosteroid injections may be given by your caregiver. These injections should be reserved for the most serious cases, because they may only be given a certain number of times.  HEAT AND COLD  Cold treatment (icing) relieves pain and reduces inflammation. Cold treatment should be applied for 10 to 15 minutes every 2 to 3 hours for inflammation and pain and immediately after any activity that aggravates your symptoms. Use ice packs or massage the area with a piece of ice (ice massage).  Heat treatment may be used prior to performing the stretching and strengthening activities prescribed by your caregiver, physical therapist, or athletic trainer. Use a heat pack or soak the injury in warm water.  SEEK IMMEDIATE MEDICAL CARE IF:  Treatment seems to offer no benefit, or the condition worsens.  Any medications   produce adverse side effects.  EXERCISES- RANGE OF MOTION (ROM) AND STRETCHING EXERCISES - Plantar Fasciitis (Heel Spur Syndrome) These exercises  may help you when beginning to rehabilitate your injury. Your symptoms may resolve with or without further involvement from your physician, physical therapist or athletic trainer. While completing these exercises, remember:   Restoring tissue flexibility helps normal motion to return to the joints. This allows healthier, less painful movement and activity.  An effective stretch should be held for at least 30 seconds.  A stretch should never be painful. You should only feel a gentle lengthening or release in the stretched tissue.  RANGE OF MOTION - Toe Extension, Flexion  Sit with your right / left leg crossed over your opposite knee.  Grasp your toes and gently pull them back toward the top of your foot. You should feel a stretch on the bottom of your toes and/or foot.  Hold this stretch for 10 seconds.  Now, gently pull your toes toward the bottom of your foot. You should feel a stretch on the top of your toes and or foot.  Hold this stretch for 10 seconds. Repeat  times. Complete this stretch 3 times per day.   RANGE OF MOTION - Ankle Dorsiflexion, Active Assisted  Remove shoes and sit on a chair that is preferably not on a carpeted surface.  Place right / left foot under knee. Extend your opposite leg for support.  Keeping your heel down, slide your right / left foot back toward the chair until you feel a stretch at your ankle or calf. If you do not feel a stretch, slide your bottom forward to the edge of the chair, while still keeping your heel down.  Hold this stretch for 10 seconds. Repeat 3 times. Complete this stretch 2 times per day.   STRETCH  Gastroc, Standing  Place hands on wall.  Extend right / left leg, keeping the front knee somewhat bent.  Slightly point your toes inward on your back foot.  Keeping your right / left heel on the floor and your knee straight, shift your weight toward the wall, not allowing your back to arch.  You should feel a gentle stretch  in the right / left calf. Hold this position for 10 seconds. Repeat 3 times. Complete this stretch 2 times per day.  STRETCH  Soleus, Standing  Place hands on wall.  Extend right / left leg, keeping the other knee somewhat bent.  Slightly point your toes inward on your back foot.  Keep your right / left heel on the floor, bend your back knee, and slightly shift your weight over the back leg so that you feel a gentle stretch deep in your back calf.  Hold this position for 10 seconds. Repeat 3 times. Complete this stretch 2 times per day.  STRETCH  Gastrocsoleus, Standing  Note: This exercise can place a lot of stress on your foot and ankle. Please complete this exercise only if specifically instructed by your caregiver.   Place the ball of your right / left foot on a step, keeping your other foot firmly on the same step.  Hold on to the wall or a rail for balance.  Slowly lift your other foot, allowing your body weight to press your heel down over the edge of the step.  You should feel a stretch in your right / left calf.  Hold this position for 10 seconds.  Repeat this exercise with a slight bend in your right /   left knee. Repeat 3 times. Complete this stretch 2 times per day.   STRENGTHENING EXERCISES - Plantar Fasciitis (Heel Spur Syndrome)  These exercises may help you when beginning to rehabilitate your injury. They may resolve your symptoms with or without further involvement from your physician, physical therapist or athletic trainer. While completing these exercises, remember:   Muscles can gain both the endurance and the strength needed for everyday activities through controlled exercises.  Complete these exercises as instructed by your physician, physical therapist or athletic trainer. Progress the resistance and repetitions only as guided.  STRENGTH - Towel Curls  Sit in a chair positioned on a non-carpeted surface.  Place your foot on a towel, keeping your heel  on the floor.  Pull the towel toward your heel by only curling your toes. Keep your heel on the floor. Repeat 3 times. Complete this exercise 2 times per day.  STRENGTH - Ankle Inversion  Secure one end of a rubber exercise band/tubing to a fixed object (table, pole). Loop the other end around your foot just before your toes.  Place your fists between your knees. This will focus your strengthening at your ankle.  Slowly, pull your big toe up and in, making sure the band/tubing is positioned to resist the entire motion.  Hold this position for 10 seconds.  Have your muscles resist the band/tubing as it slowly pulls your foot back to the starting position. Repeat 3 times. Complete this exercises 2 times per day.  Document Released: 11/08/2005 Document Revised: 01/31/2012 Document Reviewed: 02/20/2009 [employer] Patient Information 2014 [employer]  Onychomycosis/Fungal Toenails  WHAT IS IT? An infection that lies within the keratin of your nail plate that is caused by a fungus.  WHY ME? Fungal infections affect all ages, sexes, races, and creeds.  There may be many factors that predispose you to a fungal infection such as age, coexisting medical conditions such as diabetes, or an autoimmune disease; stress, medications, fatigue, genetics, etc.  Bottom line: fungus thrives in a warm, moist environment and your shoes offer such a location.  IS IT CONTAGIOUS? Theoretically, yes.  You do not want to share shoes, nail clippers or files with someone who has fungal toenails.  Walking around barefoot in the same room or sleeping in the same bed is unlikely to transfer the organism.  It is important to realize, however, that fungus can spread easily from one nail to the next on the same foot.  HOW DO WE TREAT THIS?  There are several ways to treat this condition.  Treatment may depend on many factors such as age, medications, pregnancy, liver and kidney conditions, etc.  It is best to ask your  doctor which options are available to you.  1. No treatment.   Unlike many other medical concerns, you can live with this condition.  However for many people this can be a painful condition and may lead to ingrown toenails or a bacterial infection.  It is recommended that you keep the nails cut short to help reduce the amount of fungal nail. 2. Topical treatment.  These range from herbal remedies to prescription strength nail lacquers.  About 40-50% effective, topicals require twice daily application for approximately 9 to 12 months or until an entirely new nail has grown out.  The most effective topicals are medical grade medications available through physicians offices. 3. Oral antifungal medications.  With an 80-90% cure rate, the most common oral medication requires 3 to 4 months of therapy and stays   in your system for a year as the new nail grows out.  Oral antifungal medications do require blood work to make sure it is a safe drug for you.  A liver function panel will be performed prior to starting the medication and after the first month of treatment.  It is important to have the blood work performed to avoid any harmful side effects.  In general, this medication safe but blood work is required. 4. Laser Therapy.  This treatment is performed by applying a specialized laser to the affected nail plate.  This therapy is noninvasive, fast, and non-painful.  It is not covered by insurance and is therefore, out of pocket.  The results have been very good with a 80-95% cure rate.  [facility] is the only practice in the area to offer this therapy. 5. Permanent Nail Avulsion.  Removing the entire nail so that a new nail will not grow back. 

## 2019-01-17 ENCOUNTER — Ambulatory Visit (INDEPENDENT_AMBULATORY_CARE_PROVIDER_SITE_OTHER): Payer: Medicare Other | Admitting: *Deleted

## 2019-01-17 ENCOUNTER — Encounter: Payer: Self-pay | Admitting: *Deleted

## 2019-01-17 DIAGNOSIS — I495 Sick sinus syndrome: Secondary | ICD-10-CM | POA: Diagnosis not present

## 2019-01-17 NOTE — Telephone Encounter (Signed)
Opened in error

## 2019-01-19 LAB — CUP PACEART REMOTE DEVICE CHECK
Battery Remaining Longevity: 112 mo
Battery Remaining Percentage: 95.5 %
Battery Voltage: 3.01 V
Brady Statistic AP VP Percent: 91 %
Brady Statistic AS VP Percent: 5.3 %
Brady Statistic AS VS Percent: 1 %
Brady Statistic RA Percent Paced: 90 %
Brady Statistic RV Percent Paced: 96 %
Implantable Lead Implant Date: 20190821
Implantable Lead Implant Date: 20190821
Implantable Lead Location: 753859
Implantable Lead Location: 753860
Implantable Pulse Generator Implant Date: 20190821
Lead Channel Impedance Value: 390 Ohm
Lead Channel Impedance Value: 540 Ohm
Lead Channel Pacing Threshold Amplitude: 0.5 V
Lead Channel Pacing Threshold Amplitude: 0.625 V
Lead Channel Pacing Threshold Pulse Width: 0.5 ms
Lead Channel Pacing Threshold Pulse Width: 0.5 ms
Lead Channel Sensing Intrinsic Amplitude: 10.8 mV
Lead Channel Sensing Intrinsic Amplitude: 2.8 mV
Lead Channel Setting Pacing Amplitude: 0.875
Lead Channel Setting Pacing Amplitude: 1.5 V
Lead Channel Setting Pacing Pulse Width: 0.5 ms
Lead Channel Setting Sensing Sensitivity: 2 mV
MDC IDC PG SERIAL: 9054359
MDC IDC SESS DTM: 20200226082321
MDC IDC STAT BRADY AP VS PERCENT: 1.5 %
Pulse Gen Model: 2272

## 2019-01-24 ENCOUNTER — Encounter: Payer: Self-pay | Admitting: Cardiology

## 2019-01-24 NOTE — Progress Notes (Signed)
Remote pacemaker transmission.   

## 2019-02-03 ENCOUNTER — Encounter: Payer: Self-pay | Admitting: Cardiovascular Disease

## 2019-02-03 NOTE — Progress Notes (Unsigned)
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 Will route to Dr C for review.    Chanetta Marshall, NP 02/03/2019 11:13 AM

## 2019-02-04 NOTE — Progress Notes (Signed)
Will call him on Monday to discuss starting anticoagulation. Thank you! MCr

## 2019-02-12 ENCOUNTER — Other Ambulatory Visit: Payer: Self-pay | Admitting: Cardiovascular Disease

## 2019-03-15 ENCOUNTER — Other Ambulatory Visit: Payer: Self-pay | Admitting: Cardiovascular Disease

## 2019-03-15 NOTE — Telephone Encounter (Signed)
Isosorbide MN 30 mg refilled. 

## 2019-03-29 ENCOUNTER — Ambulatory Visit: Payer: Medicare Other | Admitting: Podiatry

## 2019-04-05 ENCOUNTER — Telehealth: Payer: Self-pay | Admitting: Cardiovascular Disease

## 2019-04-05 NOTE — Telephone Encounter (Signed)
Call home phone/ consent/ my chart declined/ pre reg completed

## 2019-04-09 ENCOUNTER — Telehealth (INDEPENDENT_AMBULATORY_CARE_PROVIDER_SITE_OTHER): Payer: 59 | Admitting: Cardiovascular Disease

## 2019-04-09 VITALS — BP 154/64 | HR 84 | Ht 68.0 in | Wt 163.0 lb

## 2019-04-09 DIAGNOSIS — N183 Chronic kidney disease, stage 3 unspecified: Secondary | ICD-10-CM

## 2019-04-09 DIAGNOSIS — I495 Sick sinus syndrome: Secondary | ICD-10-CM

## 2019-04-09 DIAGNOSIS — I2510 Atherosclerotic heart disease of native coronary artery without angina pectoris: Secondary | ICD-10-CM | POA: Diagnosis not present

## 2019-04-09 DIAGNOSIS — I10 Essential (primary) hypertension: Secondary | ICD-10-CM

## 2019-04-09 DIAGNOSIS — I480 Paroxysmal atrial fibrillation: Secondary | ICD-10-CM | POA: Diagnosis not present

## 2019-04-09 DIAGNOSIS — I2583 Coronary atherosclerosis due to lipid rich plaque: Secondary | ICD-10-CM

## 2019-04-09 DIAGNOSIS — E785 Hyperlipidemia, unspecified: Secondary | ICD-10-CM

## 2019-04-09 MED ORDER — METOPROLOL SUCCINATE ER 25 MG PO TB24: 37.5000 mg | ORAL_TABLET | Freq: Every day | ORAL | 11 refills | Status: DC

## 2019-04-09 NOTE — Progress Notes (Signed)
Virtual Visit via Telephone Note   This visit type was conducted due to national recommendations for restrictions regarding the COVID-19 Pandemic (e.g. social distancing) in an effort to limit this patient's exposure and mitigate transmission in our community.  Due to his co-morbid illnesses, this patient is at least at moderate risk for complications without adequate follow up.  This format is felt to be most appropriate for this patient at this time.  The patient did not have access to video technology/had technical difficulties with video requiring transitioning to audio format only (telephone).  All issues noted in this document were discussed and addressed.  No physical exam could be performed with this format.  Please refer to the patient's chart for his  consent to telehealth for [facility].   Date:  04/09/2019   ID:  [name], DOB December 07, 1940, MRN [id number]  Patient Location: Home Provider Location: Home  PCP:  Rodney Lei, MD  Cardiologist:  Rodney Majestic, MD Electrophysiologist:  None   Evaluation Performed:  Follow-Up Visit  Chief Complaint:  10 month F/U  History of Present Illness:    [name] is a 78 y.o. male who has a  history of a dilated cardiomyopathy with remote ejection fractions in the 35-45% range. In  December 2012 he presented with acute congestive heart failure requiring BiPAP therapy and during his hospitalization ejection fraction dropped to 20-25%. He ruled in for non-ST segment elevation MI and was not found to have high-grade obstructive CAD. He did wear life-vest transiently and with improvement of his ejection fraction to approximately 35-40% in April 2013 his life as was discontinued. He also has a history of intermittent second-degree AV block lead leading to discontinuance of his beta blocker therapy and reduction of his amiodarone dose. An echo Doppler study in March 2014 revealed an ejection fraction that had increased to approximately  55%. 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  He was hospitalized in May 2016 with chest pain.  His cardiac enzymes were negative for MI.  He had an exercise Myoview study on 04/10/2015 and on the stress portion of the study he had episodes of nonsustained VT.  He did not have chest pain or diagnostic ECG changes and the result was felt to be low risk. During that hospitalization a follow-up echo Doppler study showed an ejection fraction that had improved to 45-50% with inferior hypokinesis.  There was mild LVH and grade 1 diastolic dysfunction.  During his hospitalization he did have some bradycardia.  He was evaluated in th ER on 01/01/17 for palpitaions and was felt to have an URI. He was febrile to 100.6.  Laboratory showed a Cr 1.74 which had increased from 1.37 10 months ago. He denies any chest pain.  He denies PND or orthopnea.  He is unaware of any recurrent palpitations.  He denies significant edema.    He has noticed very rare twinges of atypical chest pain which is short-lived and nonexertional and resolve spontaneously.  He has been taking spironolactone 12.5 mg daily, furosemide 20 mg every other day, amlodipine 10 mg in addition to isosorbide 30 mg.  He denies any anginal type symptomatology.  His has not had any significant edema on this regimen.  Laboratory showed improvement in his creatinine at 1.37 from 1.74 in February 2018.  Lipid studies were excellent, not on therapy with a total cholesterol 111, triglycerides 42, HDL 50, and LDL 53.  He sees [name] at three-month intervals.    He  was evaluated in the emergency room in December 2018.  He had some chest discomfort.  There were no acute findings.  Chest x-ray was negative.  Troponins were negative 2.  Subsequently, he has felt well.    I last saw him on Apr 07, 2018.  At that time, he remained asymptomatic with stable blood pressure without chest pain on  amlodipine 10 mg, furosemide 20 g, isosorbide 30 mg daily in addition to  spironolactone 12.5 mg daily.  His ECG at that time showed sinus rhythm with PVCs in a bigeminal rhythm for which he was asymptomatic.  He underwent an echo Doppler study on Apr 13, 2018 which showed normal LV function without wall motion abnormality, grade 2 diastolic dysfunction, mild biatrial enlargement, and mild pulmonary hypertension with a peak PA pressure 39 mm.  Laboratory in May 2019 had shown normal TSH of 1.27.  He has chronic kidney disease stage III with creatinine 1.6.  Magnesium level was normal.    During my last evaluation in July 2019 He was asymptomatic with reference to dizziness or lightheadedness but his heart rate was in the 30s and he appeared to have second-degree block intermittent junctional rhythm and I suspected significant sick sinus syndrome.  I referred him to [name] for further evaluation.  Since I last saw him, he was admitted on July 12, 2018 and underwent insertion of a new dual-chamber permanent pacemaker for symptomatic bradycardia,2nd degree AV block, and sinus node dysfunction by [name]. Subsequently, when last seen by [name] he had developed 2 very brief episodes of PAT and had a  short episode of PAF.  Present, [name] continues to feel exceptionally well.  He denies any chest pain or shortness of breath.  There is no leg swelling.  He has not been able to walk with the COVID-19 pandemic.  At times he notes a rare palpitation.  Of note, he was found to have an episode of PAF on remote monitoring.  [name] had discussed this with him and with the patient's desire not to start anticoagulation therapy the decision was made not to institute anticoagulation with his burden less than 1%.  He presents for evaluation.  The patient does not have symptoms concerning for COVID-19 infection (fever, chills, cough, or new shortness of breath).    Past Medical History:  Diagnosis Date   CAD (coronary artery disease) 80% stenosis diag of the  LAD, 30% in OM2 branch of LCX in 2009    a. Nonobstructive CAD by cath 11/2011 with the exception of the pre-existing diagonal branch #2 lesion.   Chronic systolic CHF (congestive heart failure) (HCC)    CKD (chronic kidney disease) stage 3, GFR 30-59 ml/min (HCC)    Colon polyp, hyperplastic    History of stress test 06/01/2012   Normal myocardial perfusion study. compared to the previous study there is no significant change. this is a low risk scan   Hypertension    Legally blind    "both eyes"   Myocardial infarction [facility]) 11/22/11   NICM (nonischemic cardiomyopathy) ([place])    a. Remote hx of dilated NICM with EF ranging 20-45%, including normal EF by echo (55-60%) in 2014.   Peripheral arterial disease ([place])    a. 06/2014: ABI right 0.99, left 1.2, LE dopplers revealing an occluded right posterior tibial. As symptoms were not felt r/t claudication, no further w/u at the time.   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 Requiring discontinuation of BB dose.   Spondylolisthesis    Ventricular bigeminy    Ventricular tachycardia (paroxysmal) ([place]) 04/11/2015   Past Surgical History:  Procedure Laterality Date   BACK SURGERY     CARDIAC CATHETERIZATION  11/2011   CARDIAC CATHETERIZATION  11/2011   didn't demonstrate high grade obstructive disease to account for his LV dysfunction.   CATARACT EXTRACTION, BILATERAL  1990's   CYSTOSCOPY     CYSTOSCOPY WITH URETHRAL DILATATION N/A 05/04/2013   Procedure: CYSTOSCOPY WITH URETHRAL DILATATION;  Surgeon: Eustace Moore, MD;  Location: [place] [facility];  Service: Neurosurgery;  Laterality: N/A;  with insertion of foley catheter   EP study and ablation of VT  7/13   PVC focus mapped to the right coronary cusp of the aorta, limited ablation performed due to proximity of the focus to the right coronary artery   EYE SURGERY     LEFT HEART CATHETERIZATION WITH CORONARY ANGIOGRAM N/A  11/25/2011   Procedure: LEFT HEART CATHETERIZATION WITH CORONARY ANGIOGRAM;  Surgeon: Leonie Man, MD;  Location: [facility] CATH LAB;  Service: Cardiovascular;  Laterality: N/A;   PACEMAKER IMPLANT N/A 07/12/2018   Procedure: PACEMAKER IMPLANT;  Surgeon: Sanda Klein, MD;  Location: [place] CV LAB;  Service: Cardiovascular;  Laterality: N/A;   POSTERIOR FUSION LUMBAR SPINE  1979   ROTATOR CUFF REPAIR  2000's   left   V-TACH ABLATION N/A 06/06/2012   Procedure: V-TACH ABLATION;  Surgeon: Thompson Grayer, MD;  Location: [facility] CATH LAB;  Service: Cardiovascular;  Laterality: N/A;     Current Meds  Medication Sig   albuterol (PROVENTIL HFA;VENTOLIN HFA) 108 (90 Base) MCG/ACT inhaler Inhale 1 puff into the lungs every 6 (six) hours as needed for wheezing or shortness of breath.   amLODipine (NORVASC) 10 MG tablet TAKE 1 TABLET(10 MG) BY MOUTH DAILY   BREO ELLIPTA 100-25 MCG/INH AEPB INL 1 PUFF PO QAM   dorzolamide-timolol (COSOPT) 22.3-6.8 MG/ML ophthalmic solution Place 1 drop into both eyes daily.    FLUAD 0.5 ML SUSY ADMINISTER 0.5ML IN THE MUSCLE AS DIRECTED   furosemide (LASIX) 20 MG tablet TAKE 1 TABLET(20 MG) BY MOUTH DAILY   isosorbide mononitrate (IMDUR) 30 MG 24 hr tablet TAKE 1 TABLET(30 MG) BY MOUTH DAILY   LUMIGAN 0.01 % SOLN Place 1 drop into both eyes at bedtime.    metoprolol succinate (TOPROL-XL) 25 MG 24 hr tablet TAKE 1 TABLET(25 MG) BY MOUTH DAILY   nitroGLYCERIN (NITROSTAT) 0.4 MG SL tablet Place 1 tablet (0.4 mg total) under the tongue every 5 (five) minutes as needed for chest pain.   RESTASIS 0.05 % ophthalmic emulsion Place 1 drop into both eyes daily.   spironolactone (ALDACTONE) 25 MG tablet TAKE 1/2 TABLET BY MOUTH TWICE DAILY, KEEP OFFICE VISIT (Patient taking differently: Take 12.5 mg by mouth daily. )     Allergies:   Patient has no known allergies.   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  ROS:   Please see the history of present illness.    No fevers chills night sweats, cough No change in vision.  No dizziness No wheezing No chest pain, PND orthopnea. Has not been as physically active with a COVID-19 pandemic No bleeding No abdominal discomfort History of GERD, controlled Sleeping well All other systems reviewed and are negative.   Prior CV studies:   The following studies were reviewed today:  ------------------------------------------------------------------- ECHO Study Conclusions: 04/13/2018  - Left ventricle: The cavity size was normal. Wall thickness was   normal. Systolic function was normal. The estimated ejection   fraction was in the range of 50% to 55%. Wall motion was normal;   there were no regional wall motion abnormalities. Features are   consistent with a pseudonormal left ventricular filling pattern,   with concomitant abnormal relaxation and increased filling   pressure (grade 2 diastolic dysfunction). - Left atrium: The atrium was mildly dilated. - Right ventricle: The cavity size was mildly dilated. - Right atrium: The atrium was mildly dilated. - Pulmonary arteries: Systolic pressure was mildly increased. PA   peak pressure: 39 mm Hg (S).  Impressions:  - Normal LV systolic function; mild diastolic dysfunction; mild   LAE; mild RAE/RVE; mild TR with mild pulmonary hypertension.   Labs/Other Tests and Data Reviewed:    EKG:  An ECG dated 10/18/2018 was personally reviewed today and demonstrated:  AV paced rhythm at 101  Recent Labs: 07/10/2018: BUN 15; Creatinine, Ser 1.52;  Hemoglobin 14.2; Platelets 146; Potassium 4.0; Sodium 144   Recent Lipid Panel Lab Results  Component Value Date/Time   CHOL 111 01/24/2017 09:50 AM   TRIG 42 01/24/2017 09:50 AM   HDL 50 01/24/2017 09:50 AM   CHOLHDL 2.2 01/24/2017 09:50 AM   LDLCALC 53 01/24/2017 09:50 AM    Wt Readings from Last 3 Encounters:  04/09/19 163 lb (73.9 kg)  10/18/18 170 lb 12.8 oz (77.5 kg)  08/01/18 168 lb 3.2 oz (76.3 kg)     Objective:    Vital Signs:  BP (!) 154/64    Pulse 84    Ht 5\' 8"  (1.727 m)    Wt 163 lb (73.9 kg)    BMI 24.78 kg/m    BP today is increased which is higher than his previous evaluations.  Resting pulse was in the 80s. He denied any change in his physical appearance. Respirations were normal and not labored I did not hear any audible wheezing When asked to palpate his pulse, it was regular There was no chest wall tenderness to his palpation There were no rashes. He did not have abdominal tenderness There was no leg swelling per his report He denied any change in neurologic issues He felt well, had a normal pleasant affect    ASSESSMENT & PLAN:    1. Nonischemic cardiomyopathy: The patient feels well and denies any symptoms suggestive of volume overload.  His initial echo in 2012 was in the 20 to 25% range.  His most recent echo in 2019 showed an EF of 50 to 55%.  He continues to be on a regimen of amlodipine 5 mg, Toprol-XL 25 mg, spironolactone 12.5 mg, isosorbide 30 mg, and furosemide 20 mg. 2. Essential hypertension: Blood pressure today is elevated.  He now has a pacemaker.  When I had previously seen him he had sick sinus syndrome and asymptomatic bradycardia.  I will now further titrate Toprol-XL from 25 mg up to 37.5 mg initially and depending  upon blood pressure response this can be further titrated to 50 mg as needed.  He has not had recent laboratory and with his history of renal insufficiency at this point I will not titrate spironolactone. 3. 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  He has been demonstrated to have normal pacemaker function with follow-up.  He has had transient episodes of increased heart rate with 2 episodes of atrial tachycardia, a brief episode of PAF after his implantation and more recently another episode of PAF.  He is monitored by [name].  There has been discussion regarding anticoagulation but due to his low burden this has not been instituted. 4. Chronic kidney disease: The patient's last laboratory was in August 2019. Creatinine was 1.52 consistent with stage III kidney disease.  I have recommended repeat laboratory. 5. Nonobstructive CAD: Asymptomatic on isosorbide, amlodipine, and Toprol-XL.  He should be on aspirin 81 mg if he is not.  We will recheck. 6. Hyperlipidemia: Has been on pravastatin for many years.  This currently is not on his list.  I will recheck laboratory for reassessment of LV function.  Most recent lipid panel showed an LDL of 53.  COVID-19 Education: The signs and symptoms of COVID-19 were discussed with the patient and how to seek care for testing (follow up with PCP or arrange E-visit).  *The importance of social distancing was discussed today.  Time:   Today, I have spent 14 minutes with the patient with telehealth technology discussing the above problems and an additional 16 minutes with chart review and notes .     Medication Adjustments/Labs and Tests Ordered: Current medicines are reviewed at length with the patient today.  Concerns regarding medicines are outlined above.   Tests Ordered: No orders of the defined types were placed in this encounter.   Medication Changes: No orders of the defined types were placed in this encounter.   Disposition:  Follow up 6 months  Signed, Rodney Majestic, MD  04/09/2019 7:57 AM    [facility]

## 2019-04-09 NOTE — Patient Instructions (Addendum)
Medication Instructions:  Increase Metoprolol to 37.5 mg (1.5 tablet)  If you need a refill on your cardiac medications before your next appointment, please call your pharmacy.   Lab work: Fasting lab work (CBC, CMET, TSH, LIPID)  Attached are the lab orders that are needed before your upcoming appointment, please come in  anytime to have your labs drawn.   They are fasting labs, so nothing to eat or drink after midnight.  Lab hours: 8:00-4:00 lunch hours 12:45-1:45  If you have labs (blood work) drawn today and your tests are completely normal, you will receive your results only by: Marland Kitchen MyChart Message (if you have MyChart) OR . A paper copy in the mail If you have any lab test that is abnormal or we need to change your treatment, we will call you to review the results.  Follow-Up: At [facility], you and your health needs are our priority.  As part of our continuing mission to provide you with exceptional heart care, we have created designated Provider Care Teams.  These Care Teams include your primary Cardiologist (physician) and Advanced Practice Providers (APPs -  Physician Assistants and Nurse Practitioners) who all work together to provide you with the care you need, when you need it. You will need a follow up appointment in 6 months.  Please call our office 2 months in advance to schedule this appointment.  You may see Dr.Kelly or one of the following Advanced Practice Providers on your designated Care Team: Almyra Deforest, [place] . Fabian Sharp, PA-C

## 2019-04-13 LAB — COMPREHENSIVE METABOLIC PANEL
ALT: 16 IU/L (ref 0–44)
AST: 18 IU/L (ref 0–40)
Albumin/Globulin Ratio: 1.3 (ref 1.2–2.2)
Albumin: 4 g/dL (ref 3.7–4.7)
Alkaline Phosphatase: 76 IU/L (ref 39–117)
BUN/Creatinine Ratio: 10 (ref 10–24)
BUN: 15 mg/dL (ref 8–27)
Bilirubin Total: 0.4 mg/dL (ref 0.0–1.2)
CO2: 20 mmol/L (ref 20–29)
Calcium: 9.5 mg/dL (ref 8.6–10.2)
Chloride: 105 mmol/L (ref 96–106)
Creatinine, Ser: 1.49 mg/dL — ABNORMAL HIGH (ref 0.76–1.27)
GFR calc Af Amer: 51 mL/min/{1.73_m2} — ABNORMAL LOW (ref >59)
GFR calc non Af Amer: 44 mL/min/{1.73_m2} — ABNORMAL LOW (ref >59)
Globulin, Total: 3.1 g/dL (ref 1.5–4.5)
Glucose: 111 mg/dL — ABNORMAL HIGH (ref 65–99)
Potassium: 4.3 mmol/L (ref 3.5–5.2)
Sodium: 141 mmol/L (ref 134–144)
Total Protein: 7.1 g/dL (ref 6.0–8.5)

## 2019-04-13 LAB — CBC
Hematocrit: 41.8 % (ref 37.5–51.0)
Hemoglobin: 13.9 g/dL (ref 13.0–17.7)
MCH: 30.2 pg (ref 26.6–33.0)
MCHC: 33.3 g/dL (ref 31.5–35.7)
MCV: 91 fL (ref 79–97)
Platelets: 140 10*3/uL — ABNORMAL LOW (ref 150–450)
RBC: 4.6 x10E6/uL (ref 4.14–5.80)
RDW: 12.4 % (ref 11.6–15.4)
WBC: 5.6 10*3/uL (ref 3.4–10.8)

## 2019-04-13 LAB — LIPID PANEL
Chol/HDL Ratio: 2.1 ratio (ref 0.0–5.0)
Cholesterol, Total: 107 mg/dL (ref 100–199)
HDL: 50 mg/dL (ref >39)
LDL Calculated: 40 mg/dL (ref 0–99)
Triglycerides: 85 mg/dL (ref 0–149)
VLDL Cholesterol Cal: 17 mg/dL (ref 5–40)

## 2019-04-13 LAB — TSH: TSH: 1.52 u[IU]/mL (ref 0.450–4.500)

## 2019-04-18 ENCOUNTER — Ambulatory Visit (INDEPENDENT_AMBULATORY_CARE_PROVIDER_SITE_OTHER): Payer: 59 | Admitting: *Deleted

## 2019-04-18 DIAGNOSIS — I495 Sick sinus syndrome: Secondary | ICD-10-CM | POA: Diagnosis not present

## 2019-04-19 LAB — CUP PACEART REMOTE DEVICE CHECK
Date Time Interrogation Session: 20200528082642
Implantable Lead Implant Date: 20190821
Implantable Lead Implant Date: 20190821
Implantable Lead Location: 753859
Implantable Lead Location: 753860
Implantable Pulse Generator Implant Date: 20190821
Pulse Gen Model: 2272
Pulse Gen Serial Number: 9054359

## 2019-04-24 ENCOUNTER — Telehealth: Payer: Self-pay | Admitting: *Deleted

## 2019-04-24 NOTE — Telephone Encounter (Signed)
-----   Message from Sanda Klein, MD sent at 04/21/2019  6:05 PM EDT ----- Remote reviewed.   97% V paced due to 2nd degree AV block. Battery status is good. Lead measurements are stable. Heart rate histogram is favorable. Low burden of paroxysmal atrial fibrillation (<1%), but with at least one episode lasting 4 hours.   Please schedule for virtual visit to discuss anticoagulation. APP OK if no appt available.Marland Kitchen

## 2019-04-24 NOTE — Telephone Encounter (Signed)
Patient made aware of results and verbalized understanding. Message sent to scheduling to set up the appointment.

## 2019-04-25 ENCOUNTER — Telehealth: Payer: Self-pay | Admitting: Adult Health

## 2019-04-25 NOTE — Telephone Encounter (Signed)
Declined mychart, no smartphone, consent, pre reg complete 04/25/19 AF

## 2019-04-25 NOTE — Telephone Encounter (Signed)
New Message ° ° ° °Pt is returning call  ° ° ° °Please call back  °

## 2019-04-26 ENCOUNTER — Encounter: Payer: Self-pay | Admitting: Cardiology

## 2019-04-26 NOTE — Progress Notes (Signed)
Remote pacemaker transmission.   

## 2019-04-30 ENCOUNTER — Encounter: Payer: Self-pay | Admitting: Medical

## 2019-04-30 ENCOUNTER — Telehealth (INDEPENDENT_AMBULATORY_CARE_PROVIDER_SITE_OTHER): Payer: 59 | Admitting: Medical

## 2019-04-30 VITALS — Ht 68.0 in | Wt 166.4 lb

## 2019-04-30 DIAGNOSIS — N183 Chronic kidney disease, stage 3 unspecified: Secondary | ICD-10-CM

## 2019-04-30 DIAGNOSIS — I10 Essential (primary) hypertension: Secondary | ICD-10-CM

## 2019-04-30 DIAGNOSIS — I2510 Atherosclerotic heart disease of native coronary artery without angina pectoris: Secondary | ICD-10-CM

## 2019-04-30 DIAGNOSIS — E785 Hyperlipidemia, unspecified: Secondary | ICD-10-CM

## 2019-04-30 DIAGNOSIS — I495 Sick sinus syndrome: Secondary | ICD-10-CM

## 2019-04-30 DIAGNOSIS — I480 Paroxysmal atrial fibrillation: Secondary | ICD-10-CM | POA: Diagnosis not present

## 2019-04-30 DIAGNOSIS — I428 Other cardiomyopathies: Secondary | ICD-10-CM

## 2019-04-30 MED ORDER — APIXABAN 2.5 MG PO TABS: 2.5000 mg | ORAL_TABLET | Freq: Two times a day (BID) | ORAL | 6 refills | Status: DC

## 2019-04-30 NOTE — Progress Notes (Signed)
Virtual Visit via Telephone Note   This visit type was conducted due to national recommendations for restrictions regarding the COVID-19 Pandemic (e.g. social distancing) in an effort to limit this patient's exposure and mitigate transmission in our community.  Due to his co-morbid illnesses, this patient is at least at moderate risk for complications without adequate follow up.  This format is felt to be most appropriate for this patient at this time.  The patient did not have access to video technology/had technical difficulties with video requiring transitioning to audio format only (telephone).  All issues noted in this document were discussed and addressed.  No physical exam could be performed with this format.  Please refer to the patient's chart for his  consent to telehealth for [facility].   Date:  04/30/2019   ID:  [name], DOB [birth year], MRN [id number]  Patient Location: Home Provider Location: Office  PCP:  Lucianne Lei, MD  Cardiologist:  Shelva Majestic, MD  Electrophysiologist:  Sanda Klein, MD   Evaluation Performed:  Follow-Up Visit  Chief Complaint:  Newly diagnosed paroxysmal atrial fibrillation  History of Present Illness:    [name] is a 78 y.o. 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   He was last evaluated by cardiology via a telemedicine visit with Dr. Claiborne Billings 04/09/2019, at which time he was doing well from a cardiac standpoint. He had recently been diagnosed with paroxysmal atrial fibrillation, however given low burden, anticoagulation was deferred at that time per patient preference. His metoprolol was increased to 37.5mg  daily for better BP control and he was recommended for ongoing routine PPM  monitoring.   He presents today via telemedicine for follow-up discussion of atrial fibrillation. He again was noted to have PAF on his PPM interrogation 03/2019 with longest episode lasting 4 hours. He denies felling any palpitations, chest pain, SOB, dizziness, lightheadedness, weakness, nausea, vomiting, or syncope. We discussed the nature of atrial fibrillation and the risk of developing a stroke. He is in agreement with starting anticoagulation for stroke prevention at this time.   The patient does not have symptoms concerning for COVID-19 infection (fever, chills, cough, or new shortness of breath).      Past Medical History:  Diagnosis Date   CAD (coronary artery disease) 80% stenosis diag of the LAD, 30% in OM2 branch of LCX in 2009    a. Nonobstructive CAD by cath 11/2011 with the exception of the pre-existing diagonal branch #2 lesion.   Chronic systolic CHF (congestive heart failure) (HCC)    CKD (chronic kidney disease) stage 3, GFR 30-59 ml/min (HCC)    Colon polyp, hyperplastic    History of stress test 06/01/2012   Normal myocardial perfusion study. compared to the previous study there is no significant change. this is a low risk scan   Hypertension    Legally blind    "both eyes"   Myocardial infarction [facility]) 11/22/11   NICM (nonischemic cardiomyopathy) ([place])    a. Remote hx of dilated NICM with EF ranging 20-45%, including normal EF by echo (55-60%) in 2014.   Peripheral arterial disease ([place])    a. 06/2014: ABI right 0.99, left 1.2, LE dopplers revealing an occluded right posterior tibial. As symptoms were not felt r/t claudication, no further w/u  at the time.   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 Requiring discontinuation of BB dose.   Spondylolisthesis    Ventricular bigeminy    Ventricular tachycardia (paroxysmal) ([place]) 04/11/2015   Past Surgical History:  Procedure Laterality Date   BACK SURGERY      CARDIAC CATHETERIZATION  11/2011   CARDIAC CATHETERIZATION  11/2011   didn't demonstrate high grade obstructive disease to account for his LV dysfunction.   CATARACT EXTRACTION, BILATERAL  1990's   CYSTOSCOPY     CYSTOSCOPY WITH URETHRAL DILATATION N/A 05/04/2013   Procedure: CYSTOSCOPY WITH URETHRAL DILATATION;  Surgeon: Eustace Moore, MD;  Location: [facility];  Service: Neurosurgery;  Laterality: N/A;  with insertion of foley catheter   EP study and ablation of VT  7/13   PVC focus mapped to the right coronary cusp of the aorta, limited ablation performed due to proximity of the focus to the right coronary artery   EYE SURGERY     LEFT HEART CATHETERIZATION WITH CORONARY ANGIOGRAM N/A 11/25/2011   Procedure: LEFT HEART CATHETERIZATION WITH CORONARY ANGIOGRAM;  Surgeon: Leonie Man, MD;  Location: [facility] CATH LAB;  Service: Cardiovascular;  Laterality: N/A;   PACEMAKER IMPLANT N/A 07/12/2018   Procedure: PACEMAKER IMPLANT;  Surgeon: Sanda Klein, MD;  Location: [facility] CV LAB;  Service: Cardiovascular;  Laterality: N/A;   POSTERIOR FUSION LUMBAR SPINE  1979   ROTATOR CUFF REPAIR  2000's   left   V-TACH ABLATION N/A 06/06/2012   Procedure: V-TACH ABLATION;  Surgeon: Thompson Grayer, MD;  Location: [facility] CATH LAB;  Service: Cardiovascular;  Laterality: N/A;     Current Meds  Medication Sig   albuterol (PROVENTIL HFA;VENTOLIN HFA) 108 (90 Base) MCG/ACT inhaler Inhale 1 puff into the lungs every 6 (six) hours as needed for wheezing or shortness of breath.   amLODipine (NORVASC) 10 MG tablet TAKE 1 TABLET(10 MG) BY MOUTH DAILY   BREO ELLIPTA 100-25 MCG/INH AEPB INL 1 PUFF PO QAM   dorzolamide-timolol (COSOPT) 22.3-6.8 MG/ML ophthalmic solution Place 1 drop into both eyes daily.    FLUAD 0.5 ML SUSY ADMINISTER 0.5ML IN THE MUSCLE AS DIRECTED   furosemide (LASIX) 20 MG tablet TAKE 1 TABLET(20 MG) BY MOUTH DAILY   isosorbide mononitrate (IMDUR) 30 MG 24 hr tablet TAKE 1  TABLET(30 MG) BY MOUTH DAILY   LUMIGAN 0.01 % SOLN Place 1 drop into both eyes at bedtime.    metoprolol succinate (TOPROL-XL) 25 MG 24 hr tablet Take 1.5 tablets (37.5 mg total) by mouth daily.   nitroGLYCERIN (NITROSTAT) 0.4 MG SL tablet Place 1 tablet (0.4 mg total) under the tongue every 5 (five) minutes as needed for chest pain.   RESTASIS 0.05 % ophthalmic emulsion Place 1 drop into both eyes daily.   spironolactone (ALDACTONE) 25 MG tablet TAKE 1/2 TABLET BY MOUTH TWICE DAILY, KEEP OFFICE VISIT (Patient taking differently: Take 12.5 mg by mouth daily. )   [DISCONTINUED] aspirin EC 81 MG tablet Take 81 mg by mouth daily.     Allergies:   Patient has no known allergies.   Social History   Tobacco Use   Smoking status: Former Smoker    Years: 50.00    Types: Cigarettes    Last attempt to quit: 11/23/1999    Years since quitting: 19.4   Smokeless tobacco: Never Used  Substance Use Topics   Alcohol use: No    Comment: 05/26/12 "used to  be a drunk; stopped drinking in the early 1980's"   Drug use: No     Family Hx: The patient's family history includes Asthma in his mother; Other in his unknown relative.  ROS:   Please see the history of present illness.     All other systems reviewed and are negative.   Prior CV studies:   The following studies were reviewed today:  PPM interrogation 04/19/2019: Notes recorded by Sanda Klein, MD on 04/21/2019 at 6:05 PM EDT Remote reviewed.  97% V paced due to 2nd degree AV block. Battery status is good. Lead measurements are stable. Heart rate histogram is favorable. Low burden of paroxysmal atrial fibrillation (<1%), but with at least one episode lasting 4 hours.   Please schedule for virtual visit to discuss anticoagulation. APP OK if no appt available.  Echocardiogram 03/2018: Study Conclusions  - Left ventricle: The cavity size was normal. Wall thickness was   normal. Systolic function was normal. The estimated  ejection   fraction was in the range of 50% to 55%. Wall motion was normal;   there were no regional wall motion abnormalities. Features are   consistent with a pseudonormal left ventricular filling pattern,   with concomitant abnormal relaxation and increased filling   pressure (grade 2 diastolic dysfunction). - Left atrium: The atrium was mildly dilated. - Right ventricle: The cavity size was mildly dilated. - Right atrium: The atrium was mildly dilated. - Pulmonary arteries: Systolic pressure was mildly increased. PA   peak pressure: 39 mm Hg (S).  Impressions:  - Normal LV systolic function; mild diastolic dysfunction; mild   LAE; mild RAE/RVE; mild TR with mild pulmonary hypertension.   Labs/Other Tests and Data Reviewed:    EKG:  An ECG dated 10/18/2018 was personally reviewed today and demonstrated:  A-V paced rhythm with PVC, rate 101  Recent Labs: 04/13/2019: ALT 16; BUN 15; Creatinine, Ser 1.49; Hemoglobin 13.9; Platelets 140; Potassium 4.3; Sodium 141; TSH 1.520   Recent Lipid Panel Lab Results  Component Value Date/Time   CHOL 107 04/13/2019 11:23 AM   TRIG 85 04/13/2019 11:23 AM   HDL 50 04/13/2019 11:23 AM   CHOLHDL 2.1 04/13/2019 11:23 AM   CHOLHDL 2.2 01/24/2017 09:50 AM   LDLCALC 40 04/13/2019 11:23 AM    Wt Readings from Last 3 Encounters:  04/30/19 166 lb 6.4 oz (75.5 kg)  04/09/19 163 lb (73.9 kg)  10/18/18 170 lb 12.8 oz (77.5 kg)     Objective:    Vital Signs:  Ht 5\' 8"  (1.727 m)    Wt 166 lb 6.4 oz (75.5 kg)    BMI 25.30 kg/m    Patient is pleasant and does not sound SOB over the phone. He is alert and oriented x3 No LE edema per patient  ASSESSMENT & PLAN:    1. Paroxysmal atrial fibrillation: captured on PPM interrogation 12/2018 and 03/2019 with <1% burden with longest episode lasting 4 hours. This patients CHA2DS2-VASc Score and unadjusted Ischemic Stroke Rate (% per year) is equal to 7.2 % stroke rate/year from a score of 5 (CHF, HTN,  Vascular, Age>75) - Will start apixaban 2.5mg  BID (dose adjusted for age and Cr > 1.5)  - Will stop aspirin to minimize bleeding risk - Continue metoprolol succinate 25mg  daily for rate control  2. Non-ischemic cardiomyopathy: Last echo with EF 50-55% 03/2018; previously 20-25% in 2012. No volume overload complaints at this time.  - Continue metoprolol succinate, spironolactone, imdur, and lasix  3. HTN:  BP not available during this telemedicine visit though was noted to be elevated on previous visit.  - Continue amlodipine, metoprolol succinate, spironolactone, imdur, and lasix  4. Non-obstructive CAD: no anginal complaints - Will stop aspirin given need for anticoagulation - Continue metoprolol succinate, amlodipine, and imdur - Continue statin  5. SSS s/p PPM: Last PPM check 03/2019 with normal function. Device followed by Dr. Sallyanne Kuster - Continue close monitoring of his PPM function and follow-up with Dr. Sallyanne Kuster  6. HLD: LDL 40 on lipid panel 04/13/2019. - Continue statin   7. CKD stage 3: Cr 1.49 04/13/2019; baseline 1.5-1.6 - Continue to monitor routinely   COVID-19 Education: The signs and symptoms of COVID-19 were discussed with the patient and how to seek care for testing (follow up with PCP or arrange E-visit).  The importance of social distancing was discussed today.  Time:   Today, I have spent 9 minutes with the patient with telehealth technology discussing the above problems.     Medication Adjustments/Labs and Tests Ordered: Current medicines are reviewed at length with the patient today.  Concerns regarding medicines are outlined above.   Tests Ordered: No orders of the defined types were placed in this encounter.   Medication Changes: STOP aspirin 81mg  daily START apixaban 2.5mg  two times per day  Disposition:  Follow up Dr. Claiborne Billings in 6 months  Signed, Abigail Butts, PA-C  04/30/2019 5:13 PM    [place]

## 2019-04-30 NOTE — Patient Instructions (Signed)
Medication Instructions:  STOP ASPIRIN  START ELIQUIS 2.5MG  TWICE DAILY If you need a refill on your cardiac medications before your next appointment, please call your pharmacy.  Follow-Up: You will need a follow up appointment in 6 months.  Please call our office 2 months in advance, October 2020, to schedule this appointment.  You may see Shelva Majestic, MD or one of the following Advanced Practice Providers on your designated Care Team:  Almyra Deforest, PA-C  Fabian Sharp, PA-C      At [facility], you and your health needs are our priority.  As part of our continuing mission to provide you with exceptional heart care, we have created designated Provider Care Teams.  These Care Teams include your primary Cardiologist (physician) and Advanced Practice Providers (APPs -  Physician Assistants and Nurse Practitioners) who all work together to provide you with the care you need, when you need it.  Thank you for choosing [facility] at [facility]!!

## 2019-05-01 NOTE — Progress Notes (Signed)
Thank you :)

## 2019-05-16 ENCOUNTER — Other Ambulatory Visit: Payer: Self-pay

## 2019-05-16 ENCOUNTER — Encounter: Payer: Self-pay | Admitting: Podiatry

## 2019-05-16 ENCOUNTER — Ambulatory Visit (INDEPENDENT_AMBULATORY_CARE_PROVIDER_SITE_OTHER): Payer: 59 | Admitting: Podiatry

## 2019-05-16 DIAGNOSIS — I739 Peripheral vascular disease, unspecified: Secondary | ICD-10-CM

## 2019-05-16 DIAGNOSIS — B351 Tinea unguium: Secondary | ICD-10-CM | POA: Insufficient documentation

## 2019-05-16 DIAGNOSIS — M79674 Pain in right toe(s): Secondary | ICD-10-CM | POA: Diagnosis not present

## 2019-05-16 DIAGNOSIS — D689 Coagulation defect, unspecified: Secondary | ICD-10-CM | POA: Insufficient documentation

## 2019-05-16 DIAGNOSIS — M79675 Pain in left toe(s): Secondary | ICD-10-CM

## 2019-05-16 NOTE — Progress Notes (Signed)
Complaint:  Visit Type: Patient returns to my office for continued preventative foot care services. Complaint: Patient states" my nails have grown long and thick and become painful to walk and wear shoes" Patient has been diagnosed with peripheral arterial disease. The patient presents for preventative foot care services. No changes to ROS  Podiatric Exam: Vascular: dorsalis pedis and posterior tibial pulses are palpable bilateral. Capillary return is immediate. Temperature gradient is WNL. Skin turgor WNL  Sensorium: Normal Semmes Weinstein monofilament test. Normal tactile sensation bilaterally. Nail Exam: Pt has thick disfigured discolored nails with subungual debris noted bilateral entire nail hallux through fifth toenails Ulcer Exam: There is no evidence of ulcer or pre-ulcerative changes or infection. Orthopedic Exam: Muscle tone and strength are WNL. No limitations in general ROM. No crepitus or effusions noted. Foot type and digits show no abnormalities. Bony prominences are unremarkable. Skin: No Porokeratosis. No infection or ulcers  Diagnosis:  Onychomycosis, , Pain in right toe, pain in left toes  Treatment & Plan Procedures and Treatment: Consent by patient was obtained for treatment procedures.   Debridement of mycotic and hypertrophic toenails, 1 through 5 bilateral and clearing of subungual debris. No ulceration, no infection noted.  Return Visit-Office Procedure: Patient instructed to return to the office for a follow up visit 3 months for continued evaluation and treatment.    Gardiner Barefoot DPM

## 2019-05-23 ENCOUNTER — Other Ambulatory Visit: Payer: Self-pay | Admitting: Critical Care Medicine

## 2019-05-23 DIAGNOSIS — Z20822 Contact with and (suspected) exposure to covid-19: Secondary | ICD-10-CM

## 2019-05-23 NOTE — Progress Notes (Signed)
[id number] 

## 2019-05-29 LAB — NOVEL CORONAVIRUS, NAA: SARS-CoV-2, NAA: NOT DETECTED

## 2019-06-02 ENCOUNTER — Other Ambulatory Visit: Payer: Self-pay

## 2019-06-02 DIAGNOSIS — Z20822 Contact with and (suspected) exposure to covid-19: Secondary | ICD-10-CM

## 2019-06-08 LAB — NOVEL CORONAVIRUS, NAA: SARS-CoV-2, NAA: NOT DETECTED

## 2019-06-09 ENCOUNTER — Telehealth: Payer: Self-pay

## 2019-06-09 NOTE — Telephone Encounter (Signed)
Pt called back and covid results given. Discussed signs and symptoms and pt encourage to maintain social distancing and wear mask when in the public.

## 2019-06-20 ENCOUNTER — Other Ambulatory Visit: Payer: Self-pay | Admitting: Cardiovascular Disease

## 2019-06-21 ENCOUNTER — Other Ambulatory Visit: Payer: Self-pay | Admitting: Cardiovascular Disease

## 2019-07-14 ENCOUNTER — Other Ambulatory Visit: Payer: Self-pay

## 2019-07-14 DIAGNOSIS — Z20822 Contact with and (suspected) exposure to covid-19: Secondary | ICD-10-CM

## 2019-07-15 LAB — NOVEL CORONAVIRUS, NAA: SARS-CoV-2, NAA: NOT DETECTED

## 2019-07-18 ENCOUNTER — Encounter: Payer: 59 | Admitting: *Deleted

## 2019-07-19 ENCOUNTER — Other Ambulatory Visit: Payer: Self-pay | Admitting: Cardiovascular Disease

## 2019-08-03 ENCOUNTER — Telehealth: Payer: Self-pay | Admitting: Cardiovascular Disease

## 2019-08-03 NOTE — Telephone Encounter (Signed)
New message   Patient's friend has a question about transmission of patient's device. Please call to discuss.

## 2019-08-03 NOTE — Telephone Encounter (Signed)
Spoke with patient. Merlin monitor is up to date with software/alerts check, appears 06/2019 transmission was not scheduled in [place]. Offered to reschedule remote transmission to 08/17/19 to allow for an automatic transmission. Pt is agreeable to plan, denies questions or concerns at this time.

## 2019-08-17 ENCOUNTER — Ambulatory Visit (INDEPENDENT_AMBULATORY_CARE_PROVIDER_SITE_OTHER): Payer: Medicare Other | Admitting: *Deleted

## 2019-08-17 DIAGNOSIS — I495 Sick sinus syndrome: Secondary | ICD-10-CM

## 2019-08-17 LAB — CUP PACEART REMOTE DEVICE CHECK
Battery Remaining Longevity: 116 mo
Battery Remaining Percentage: 95.5 %
Battery Voltage: 2.99 V
Brady Statistic AP VP Percent: 92 %
Brady Statistic AP VS Percent: 1.1 %
Brady Statistic AS VP Percent: 5.2 %
Brady Statistic AS VS Percent: 1 %
Brady Statistic RA Percent Paced: 90 %
Brady Statistic RV Percent Paced: 97 %
Date Time Interrogation Session: 20200925084209
Implantable Lead Implant Date: 20190821
Implantable Lead Implant Date: 20190821
Implantable Lead Location: 753859
Implantable Lead Location: 753860
Implantable Pulse Generator Implant Date: 20190821
Lead Channel Impedance Value: 400 Ohm
Lead Channel Impedance Value: 530 Ohm
Lead Channel Pacing Threshold Amplitude: 0.625 V
Lead Channel Pacing Threshold Amplitude: 0.625 V
Lead Channel Pacing Threshold Pulse Width: 0.5 ms
Lead Channel Pacing Threshold Pulse Width: 0.5 ms
Lead Channel Sensing Intrinsic Amplitude: 10.3 mV
Lead Channel Sensing Intrinsic Amplitude: 2.5 mV
Lead Channel Setting Pacing Amplitude: 0.875
Lead Channel Setting Pacing Amplitude: 1.625
Lead Channel Setting Pacing Pulse Width: 0.5 ms
Lead Channel Setting Sensing Sensitivity: 2 mV
Pulse Gen Model: 2272
Pulse Gen Serial Number: 9054359

## 2019-08-22 ENCOUNTER — Encounter: Payer: Self-pay | Admitting: Podiatry

## 2019-08-22 ENCOUNTER — Other Ambulatory Visit: Payer: Self-pay

## 2019-08-22 ENCOUNTER — Encounter: Payer: Self-pay | Admitting: Cardiology

## 2019-08-22 ENCOUNTER — Ambulatory Visit (INDEPENDENT_AMBULATORY_CARE_PROVIDER_SITE_OTHER): Payer: Medicare Other | Admitting: Podiatry

## 2019-08-22 DIAGNOSIS — M79674 Pain in right toe(s): Secondary | ICD-10-CM | POA: Diagnosis not present

## 2019-08-22 DIAGNOSIS — D689 Coagulation defect, unspecified: Secondary | ICD-10-CM

## 2019-08-22 DIAGNOSIS — M79675 Pain in left toe(s): Secondary | ICD-10-CM

## 2019-08-22 DIAGNOSIS — B351 Tinea unguium: Secondary | ICD-10-CM | POA: Diagnosis not present

## 2019-08-22 NOTE — Progress Notes (Signed)
Remote pacemaker transmission.   

## 2019-08-22 NOTE — Progress Notes (Signed)
Complaint:  Visit Type: Patient returns to my office for continued preventative foot care services. Complaint: Patient states" my nails have grown long and thick and become painful to walk and wear shoes" Patient has been diagnosed with peripheral arterial disease. The patient presents for preventative foot care services. No changes to ROS.  Patient is taking eliquiss.  Podiatric Exam: Vascular: dorsalis pedis and posterior tibial pulses are palpable bilateral. Capillary return is immediate. Temperature gradient is WNL. Skin turgor WNL  Sensorium: Normal Semmes Weinstein monofilament test. Normal tactile sensation bilaterally. Nail Exam: Pt has thick disfigured discolored nails with subungual debris noted bilateral entire nail hallux through fifth toenails Ulcer Exam: There is no evidence of ulcer or pre-ulcerative changes or infection. Orthopedic Exam: Muscle tone and strength are WNL. No limitations in general ROM. No crepitus or effusions noted. Foot type and digits show no abnormalities. Bony prominences are unremarkable. Skin: No Porokeratosis. No infection or ulcers  Diagnosis:  Onychomycosis, , Pain in right toe, pain in left toes  Treatment & Plan Procedures and Treatment: Consent by patient was obtained for treatment procedures.   Debridement of mycotic and hypertrophic toenails, 1 through 5 bilateral and clearing of subungual debris. No ulceration, no infection noted.  Return Visit-Office Procedure: Patient instructed to return to the office for a follow up visit 3 months for continued evaluation and treatment.    Gardiner Barefoot DPM

## 2019-10-02 DIAGNOSIS — H401133 Primary open-angle glaucoma, bilateral, severe stage: Secondary | ICD-10-CM | POA: Diagnosis not present

## 2019-10-02 DIAGNOSIS — H04123 Dry eye syndrome of bilateral lacrimal glands: Secondary | ICD-10-CM | POA: Diagnosis not present

## 2019-10-12 DIAGNOSIS — I10 Essential (primary) hypertension: Secondary | ICD-10-CM | POA: Diagnosis not present

## 2019-10-12 DIAGNOSIS — E785 Hyperlipidemia, unspecified: Secondary | ICD-10-CM | POA: Diagnosis not present

## 2019-10-12 DIAGNOSIS — N189 Chronic kidney disease, unspecified: Secondary | ICD-10-CM | POA: Diagnosis not present

## 2019-10-12 DIAGNOSIS — H42 Glaucoma in diseases classified elsewhere: Secondary | ICD-10-CM | POA: Diagnosis not present

## 2019-10-16 ENCOUNTER — Other Ambulatory Visit: Payer: Self-pay | Admitting: Cardiology

## 2019-10-16 DIAGNOSIS — Z20822 Contact with and (suspected) exposure to covid-19: Secondary | ICD-10-CM

## 2019-10-18 LAB — NOVEL CORONAVIRUS, NAA: SARS-CoV-2, NAA: NOT DETECTED

## 2019-10-22 ENCOUNTER — Encounter: Payer: Self-pay | Admitting: Cardiovascular Disease

## 2019-10-22 ENCOUNTER — Ambulatory Visit (INDEPENDENT_AMBULATORY_CARE_PROVIDER_SITE_OTHER): Payer: Medicare Other | Admitting: Cardiovascular Disease

## 2019-10-22 ENCOUNTER — Other Ambulatory Visit: Payer: Self-pay

## 2019-10-22 VITALS — BP 125/78 | HR 102 | Temp 96.9°F | Ht 68.0 in | Wt 171.0 lb

## 2019-10-22 DIAGNOSIS — I441 Atrioventricular block, second degree: Secondary | ICD-10-CM | POA: Diagnosis not present

## 2019-10-22 DIAGNOSIS — I472 Ventricular tachycardia, unspecified: Secondary | ICD-10-CM

## 2019-10-22 DIAGNOSIS — N1831 Chronic kidney disease, stage 3a: Secondary | ICD-10-CM

## 2019-10-22 DIAGNOSIS — I480 Paroxysmal atrial fibrillation: Secondary | ICD-10-CM

## 2019-10-22 DIAGNOSIS — Z950 Presence of cardiac pacemaker: Secondary | ICD-10-CM

## 2019-10-22 DIAGNOSIS — I495 Sick sinus syndrome: Secondary | ICD-10-CM | POA: Diagnosis not present

## 2019-10-22 DIAGNOSIS — I25118 Atherosclerotic heart disease of native coronary artery with other forms of angina pectoris: Secondary | ICD-10-CM | POA: Diagnosis not present

## 2019-10-22 DIAGNOSIS — I10 Essential (primary) hypertension: Secondary | ICD-10-CM

## 2019-10-22 NOTE — Patient Instructions (Signed)

## 2019-10-22 NOTE — Progress Notes (Signed)
Cardiology Office Note:    Date:  10/22/2019   ID:  [name], DOB 06-Nov-1941, MRN [id number]  PCP:  Lucianne Lei, MD  Cardiologist: Claiborne Billings Referring MD: Lucianne Lei, MD   Chief Complaint  Patient presents with  . Pacemaker Check  . Irregular Heart Beat    History of Present Illness:    [name] is a 78 y.o. 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  He generally feels well.  He lives independently and does not feel limited.  He only has problems with shortness of breath if he tries to run or walk very fast.  The patient specifically denies any chest pain at rest or with exertion, dyspnea at rest or with usual exertion, orthopnea, paroxysmal nocturnal dyspnea, syncope, palpitations, focal neurological deficits, intermittent claudication, lower extremity edema, unexplained weight gain, cough, hemoptysis or wheezing.  Pacemaker interrogation today shows underlying rhythm is sinus bradycardia with second-degree AV block Mobitz type I and a ventricular rate in the high 30s-low 40s.  His generator has 9-10 years of estimated longevity.  He has 89% atrial pacing and 96% ventricular pacing with a reasonably good heart rate histogram.  The burden of atrial fibrillation is very low at less than 1%, but some of the episodes have lasted for 3 hours.  Rate control is good.  Lead parameters are in normal range.   Past Medical History:  Diagnosis  Date  . CAD (coronary artery disease) 80% stenosis diag of the LAD, 30% in OM2 branch of LCX in 2009    a. Nonobstructive CAD by cath 11/2011 with the exception of the pre-existing diagonal branch #2 lesion.  . Chronic systolic CHF (congestive heart failure) ([place])   . CKD (chronic kidney disease) stage 3, GFR 30-59 ml/min   . Colon polyp, hyperplastic   . History of stress test 06/01/2012   Normal myocardial perfusion study. compared to the previous study there is no significant change. this is a low risk scan  . Hypertension   . Legally blind    "both eyes"  . Myocardial infarction ([place]) 11/22/11  . NICM (nonischemic cardiomyopathy) ([place])    a. Remote hx of dilated NICM with EF ranging 20-45%, including normal EF by echo (55-60%) in 2014.  Marland Kitchen Peripheral arterial disease ([place])    a. 06/2014: ABI right 0.99, left 1.2, LE dopplers revealing an occluded right posterior tibial. As symptoms were not felt r/t claudication, no further w/u at the time.  Marland Kitchen PVC's (premature ventricular contractions)   . 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 Requiring discontinuation of BB dose.  Marland Kitchen Spondylolisthesis   . Ventricular bigeminy   . Ventricular tachycardia (paroxysmal) ([place]) 04/11/2015    Past Surgical History:  Procedure Laterality Date  . BACK SURGERY    . CARDIAC CATHETERIZATION  11/2011  . CARDIAC CATHETERIZATION  11/2011   didn't demonstrate high grade obstructive disease to account for his LV dysfunction.  Marland Kitchen CATARACT EXTRACTION, BILATERAL  1990's  . CYSTOSCOPY    .  CYSTOSCOPY WITH URETHRAL DILATATION N/A 05/04/2013   Procedure: CYSTOSCOPY WITH URETHRAL DILATATION;  Surgeon: Eustace Moore, MD;  Location: [facility];  Service: Neurosurgery;  Laterality: N/A;  with insertion of foley catheter  . EP study and ablation of VT  7/13   PVC focus mapped to the right coronary cusp of the aorta, limited ablation performed due to proximity of the focus to the right coronary artery  . EYE SURGERY     . LEFT HEART CATHETERIZATION WITH CORONARY ANGIOGRAM N/A 11/25/2011   Procedure: LEFT HEART CATHETERIZATION WITH CORONARY ANGIOGRAM;  Surgeon: Leonie Man, MD;  Location: [facility] CATH LAB;  Service: Cardiovascular;  Laterality: N/A;  . PACEMAKER IMPLANT N/A 07/12/2018   Procedure: PACEMAKER IMPLANT;  Surgeon: Sanda Klein, MD;  Location: [facility] CV LAB;  Service: Cardiovascular;  Laterality: N/A;  . POSTERIOR FUSION [place]  . ROTATOR CUFF REPAIR  2000's   left  . V-TACH ABLATION N/A 06/06/2012   Procedure: V-TACH ABLATION;  Surgeon: Thompson Grayer, MD;  Location: [facility] CATH LAB;  Service: Cardiovascular;  Laterality: N/A;    Current Medications: Current Meds  Medication Sig  . albuterol (PROVENTIL HFA;VENTOLIN HFA) 108 (90 Base) MCG/ACT inhaler Inhale 1 puff into the lungs every 6 (six) hours as needed for wheezing or shortness of breath.  Marland Kitchen amLODipine (NORVASC) 10 MG tablet TAKE 1 TABLET(10 MG) BY MOUTH DAILY  . apixaban (ELIQUIS) 2.5 MG TABS tablet Take 1 tablet (2.5 mg total) by mouth 2 (two) times daily.  Marland Kitchen BREO ELLIPTA 100-25 MCG/INH AEPB INL 1 PUFF PO QAM  . dorzolamide-timolol (COSOPT) 22.3-6.8 MG/ML ophthalmic solution Place 1 drop into both eyes daily.   Marland Kitchen FLUAD 0.5 ML SUSY ADMINISTER 0.5ML IN THE MUSCLE AS DIRECTED  . furosemide (LASIX) 20 MG tablet TAKE 1 TABLET(20 MG) BY MOUTH DAILY  . isosorbide mononitrate (IMDUR) 30 MG 24 hr tablet TAKE 1 TABLET(30 MG) BY MOUTH DAILY  . isosorbide mononitrate (IMDUR) 30 MG 24 hr tablet TAKE 1 TABLET(30 MG) BY MOUTH DAILY  . LUMIGAN 0.01 % SOLN Place 1 drop into both eyes at bedtime.   . metoprolol succinate (TOPROL-XL) 25 MG 24 hr tablet Take 1.5 tablets (37.5 mg total) by mouth daily.  . nitroGLYCERIN (NITROSTAT) 0.4 MG SL tablet Place 1 tablet (0.4 mg total) under the tongue every 5 (five) minutes as needed for chest pain.  . pravastatin (PRAVACHOL) 40 MG tablet   . RESTASIS 0.05 % ophthalmic emulsion Place 1 drop into both eyes  daily.  Marland Kitchen spironolactone (ALDACTONE) 25 MG tablet Take 0.5 tablets (12.5 mg total) by mouth 2 (two) times daily.     Allergies:   Patient has no known allergies.   Social History   Socioeconomic History  . Marital status: Legally Separated    Spouse name: Not on file  . Number of children: 3  . Years of education: Not on file  . Highest education level: Not on file  Occupational History  . Occupation: Retired    Fish farm manager: RETIRED  Social Needs  . Financial resource strain: Not on file  . Food insecurity    Worry: Not on file    Inability: Not on file  . Transportation needs    Medical: Not on file    Non-medical: Not on file  Tobacco Use  . Smoking status: Former Smoker    Years: 50.00    Types: Cigarettes    Quit date: 11/23/1999    Years since quitting: 19.9  .  Smokeless tobacco: Never Used  Substance and Sexual Activity  . 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 Drug use: No  . Sexual activity: Yes  Lifestyle  . Physical activity    Days per week: Not on file    Minutes per session: Not on file  . Stress: Not on file  Relationships  . Social Herbalist on phone: Not on file    Gets together: Not on file    Attends religious service: Not on file    Active member of club or organization: Not on file    Attends meetings of clubs or organizations: Not on file    Relationship status: Not on file  Other Topics Concern  . Not on file  Social History Narrative  . Not on file     Family History: The patient's family history includes Asthma in his mother; Other in his unknown relative.  ROS:   Please see the history of present illness.    All other systems are reviewed and are negative.   EKGs/Labs/Other Studies Reviewed:    The following studies were reviewed today: Comprehensive pacemaker check in the office today  EKG:  EKG is ordered today.  It shows AV sequential pacing with a very  broad QRS of 188 ms.  QTc 465 ms.  Recent Labs: 04/13/2019: ALT 16; BUN 15; Creatinine, Ser 1.49; Hemoglobin 13.9; Platelets 140; Potassium 4.3; Sodium 141; TSH 1.520  Recent Lipid Panel    Component Value Date/Time   CHOL 107 04/13/2019 1123   TRIG 85 04/13/2019 1123   HDL 50 04/13/2019 1123   CHOLHDL 2.1 04/13/2019 1123   CHOLHDL 2.2 01/24/2017 0950   VLDL 8 01/24/2017 0950   LDLCALC 40 04/13/2019 1123    Physical Exam:    VS:  BP 125/78   Pulse (!) 102   Temp (!) 96.9 F (36.1 C)   Ht 5\' 8"  (1.727 m)   Wt 171 lb (77.6 kg)   SpO2 98%   BMI 26.00 kg/m     Wt Readings from Last 3 Encounters:  10/22/19 171 lb (77.6 kg)  04/30/19 166 lb 6.4 oz (75.5 kg)  04/09/19 163 lb (73.9 kg)     General: Alert, oriented x3, no distress, well-healed left subclavian pacemaker site Head: no evidence of trauma, PERRL, EOMI, no exophtalmos or lid lag, no myxedema, no xanthelasma; normal ears, nose and oropharynx Neck: normal jugular venous pulsations and no hepatojugular reflux; brisk carotid pulses without delay and no carotid bruits Chest: clear to auscultation, no signs of consolidation by percussion or palpation, normal fremitus, symmetrical and full respiratory excursions Cardiovascular: normal position and quality of the apical impulse, regular rhythm, normal first and second heart sounds, no murmurs, rubs or gallops Abdomen: no tenderness or distention, no masses by palpation, no abnormal pulsatility or arterial bruits, normal bowel sounds, no hepatosplenomegaly Extremities: no clubbing, cyanosis or edema; 2+ radial, ulnar and brachial pulses bilaterally; 2+ right femoral, posterior tibial and dorsalis pedis pulses; 2+ left femoral, posterior tibial and dorsalis pedis pulses; no subclavian or femoral bruits Neurological: grossly nonfocal Psych: Normal mood and affect   ASSESSMENT:    1. Paroxysmal atrial fibrillation (HCC)   2. Mobitz type 1 second degree AV block   3. Pacemaker    4. SSS (sick sinus syndrome) ([place])   5. Coronary artery disease of native artery of native heart with stable angina  pectoris ([place])   6. VT (ventricular tachycardia) ([place])   7. Essential hypertension   8. Stage 3a chronic kidney disease    PLAN:    In order of problems listed above:  1. Paroxysmal atrial fibrillation: He has had at least 2 lengthy episodes of asymptomatic, rate controlled arrhythmia. CHADSVasc 5 (age 19, HTN, CHF, CAD).   2. 2nd deg AVB: Despite 100% ventricular pacing, no clinical evidence of heart failure exacerbation.  Both his nuclear test and his echo show ejection fraction around 50%.   3. PPM: Normal device function.  Continue remote downloads every 3 months.  Option for upgrade to CRT if he should develop any signs or symptoms of heart failure in the future. 4. SSS: Does not appear to have symptoms of chronotropic incompetence and heart rate histograms are acceptable. 5. CAD: Asymptomatic on 3 different antianginal medications.  Excellent LDL cholesterol of 52 a few days ago. 6. NSVT: He had ventricular tachycardia ablation in 2013.  No VT detected since pacemaker implantation roughly 15 months ago. 7. HTN: Well-controlled 8. CKD 3: Stable (creatinine baseline around 1.5, GFR 50).   He has an upcoming appointment Dr. Claiborne Billings in just a few days   Medication Adjustments/Labs and Tests Ordered: Current medicines are reviewed at length with the patient today.  Concerns regarding medicines are outlined above.  Orders Placed This Encounter  Procedures  . EKG 12-Lead   No orders of the defined types were placed in this encounter.   Patient Instructions  Medication Instructions:  No changes  *If you need a refill on your cardiac medications before your next appointment, please call your pharmacy*  Lab Work: None ordered If you have labs (blood work) drawn today and your tests are completely normal, you will receive your results only by: Marland Kitchen MyChart Message (if  you have MyChart) OR . A paper copy in the mail If you have any lab test that is abnormal or we need to change your treatment, we will call you to review the results.  Testing/Procedures: None ordered  Follow-Up: At [facility], you and your health needs are our priority.  As part of our continuing mission to provide you with exceptional heart care, we have created designated Provider Care Teams.  These Care Teams include your primary Cardiologist (physician) and Advanced Practice Providers (APPs -  Physician Assistants and Nurse Practitioners) who all work together to provide you with the care you need, when you need it.  Your next appointment:   12 month(s)  The format for your next appointment:   In Person  Provider:   Sanda Klein, MD     Signed, Sanda Klein, MD  10/22/2019 4:58 PM    [place]

## 2019-10-23 ENCOUNTER — Other Ambulatory Visit: Payer: Self-pay

## 2019-10-23 MED ORDER — SPIRONOLACTONE 25 MG PO TABS: 12.5000 mg | ORAL_TABLET | Freq: Two times a day (BID) | ORAL | 8 refills | Status: DC

## 2019-10-23 MED ORDER — SPIRONOLACTONE 25 MG PO TABS: 12.5000 mg | ORAL_TABLET | Freq: Two times a day (BID) | ORAL | 2 refills | Status: DC

## 2019-10-24 ENCOUNTER — Ambulatory Visit: Payer: 59 | Admitting: Cardiovascular Disease

## 2019-10-24 ENCOUNTER — Other Ambulatory Visit: Payer: Self-pay

## 2019-10-24 MED ORDER — SPIRONOLACTONE 25 MG PO TABS: 12.5000 mg | ORAL_TABLET | Freq: Two times a day (BID) | ORAL | 8 refills | Status: DC

## 2019-10-25 ENCOUNTER — Encounter: Payer: Self-pay | Admitting: Cardiovascular Disease

## 2019-10-25 ENCOUNTER — Other Ambulatory Visit: Payer: Self-pay

## 2019-10-25 ENCOUNTER — Ambulatory Visit (INDEPENDENT_AMBULATORY_CARE_PROVIDER_SITE_OTHER): Payer: Medicare Other | Admitting: Cardiovascular Disease

## 2019-10-25 VITALS — BP 128/80 | HR 70 | Temp 97.0°F | Ht 68.0 in | Wt 172.0 lb

## 2019-10-25 DIAGNOSIS — Z950 Presence of cardiac pacemaker: Secondary | ICD-10-CM | POA: Diagnosis not present

## 2019-10-25 DIAGNOSIS — I441 Atrioventricular block, second degree: Secondary | ICD-10-CM | POA: Diagnosis not present

## 2019-10-25 DIAGNOSIS — I25118 Atherosclerotic heart disease of native coronary artery with other forms of angina pectoris: Secondary | ICD-10-CM | POA: Diagnosis not present

## 2019-10-25 DIAGNOSIS — Z7901 Long term (current) use of anticoagulants: Secondary | ICD-10-CM

## 2019-10-25 DIAGNOSIS — I480 Paroxysmal atrial fibrillation: Secondary | ICD-10-CM | POA: Diagnosis not present

## 2019-10-25 DIAGNOSIS — N1831 Chronic kidney disease, stage 3a: Secondary | ICD-10-CM | POA: Diagnosis not present

## 2019-10-25 DIAGNOSIS — I10 Essential (primary) hypertension: Secondary | ICD-10-CM

## 2019-10-25 DIAGNOSIS — I428 Other cardiomyopathies: Secondary | ICD-10-CM

## 2019-10-25 NOTE — Progress Notes (Signed)
Cardiology Office Note    Date:  10/27/2019   ID:  [name], DOB [birth year], MRN [id number]  PCP:  Lucianne Lei, MD  Cardiologist:  Shelva Majestic, MD   Chief Complaint  Patient presents with  . Follow-up  . Shortness of Breath   7 month F/U  History of Present Illness:  [name] is a 78 y.o. male  whohas a history of a dilated cardiomyopathy with remote ejection fractions in the 35-45% range. In December 2012 he presented with acute congestive heart failure requiring BiPAP therapy and during his hospitalization ejection fraction dropped to 20-25%. He ruled in for non-ST segment elevation MI and was not found to have high-grade obstructive CAD. He did wear life-vest transiently and with improvement of his ejection fraction to approximately 35-40% in April 2013 his life as was discontinued. He also has a history of intermittent second-degree AV block lead leading to discontinuance of his beta blocker therapy and reduction of his amiodarone dose. An echo Doppler study in March 2014 revealed an ejection fraction that had increased to approximately 55%. 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  He was hospitalized in May 2016 with chest pain. His cardiac enzymes were negative for MI. He had an exercise Myoview study on 04/10/2015 and on the stress portion of the study he had episodes of nonsustained VT. He did not have chest pain or diagnostic ECG changes and the result was felt to be low risk. During that hospitalization a follow-up echo Doppler study showed an ejection fraction that had improved to 45-50% with inferior hypokinesis. There was mild LVH and grade 1 diastolic dysfunction. During his hospitalization he did have some bradycardia.  He was evaluated in th ER on 01/01/17 for palpitaions and was felt to have an URI. He was febrile to 100.6. Laboratory showed a Cr 1.74 which had increased from 1.37 10 months ago. He denies any chest pain. He denies PND or orthopnea. He  is unaware of any recurrent palpitations. He denies significant edema.   He has noticed very rare twinges of atypical chest pain which is short-lived and nonexertional and resolve spontaneously. He has been taking spironolactone 12.5 mg daily, furosemide 20 mg every other day, amlodipine 10 mg in addition to isosorbide 30 mg. He denies any anginal type symptomatology. His has not had any significant edema on this regimen. Laboratory showed improvement in his creatinine at 1.37 from 1.74 in February 2018. Lipid studies were excellent, not on therapy with a total cholesterol 111, triglycerides 42, HDL 50, and LDL 53. He sees Dr. Criss Rosales at three-month intervals.   He was evaluated in the emergency room in December 2018. He had some chest discomfort. There were no acute findings. Chest x-ray was negative. Troponins were negative 2. Subsequently, he has felt well.   I last saw him on Apr 07, 2018. At that time, he remained asymptomatic with stable blood pressure without chest pain on amlodipine 10 mg, furosemide 20 g, isosorbide 30 mg daily in addition to spironolactone 12.5 mg daily. His ECG at that time showed sinus rhythm with PVCs in a bigeminal rhythm for which he was asymptomatic.  He underwent an echo Doppler study on Apr 13, 2018 which showed normal LV function without wall motion abnormality, grade 2 diastolic dysfunction, mild biatrial enlargement, and mild pulmonary hypertension with a peak PA pressure 39 mm.  Laboratory in May 2019 had shown normal TSH of 1.27. He has chronic kidney disease stage III with creatinine 1.6. Magnesium  level was normal.   During my  evaluation in July 2019 he was asymptomatic with reference to dizziness or lightheadedness but his heart rate was in the 30s and he appeared to have second-degree block intermittent junctional rhythm and I suspected significant sick sinus syndrome.  I referred him to Dr. Sallyanne Kuster for further evaluation.  He was  admitted on July 12, 2018 and underwent insertion of a new dual-chamber permanent pacemaker for symptomatic bradycardia,2nd degree AV block, and sinus node dysfunction by Dr. Sallyanne Kuster. Subsequently, when last seen by Dr. Orene Desanctis he had developed 2 very brief episodes of PAT and had a  short episode of PAF.  I last evaluated him in a telemedicine visit in May 2020.  At that time he remained stable with reference to his nonischemic cardiomyopathy.  However blood pressure was elevated and I further titrated Toprol-XL to 37.5 mg. He denied any chest pain or shortness of breath.  There was no leg swelling.  He has not been able to walk with the COVID-19 pandemic.  At times he notes a rare palpitation. Of note, he was found to have an episode of PAF on remote monitoring. Dr. Sallyanne Kuster had discussed this with him and with the patient's desire not to start anticoagulation therapy the decision was made not to institute anticoagulation with his burden less than 1%.  He subsequently was evaluated in June 2020 by Roby Lofts and with recurrent PAF captured on interrogation with the longest episode lasting 4 hours and a CHA2DS2-VASc score of 5 aspirin was discontinued and he was started on Eliquis 2.5 mg twice a day with his creatinine greater than 1.5. and age of 72.  He recently saw Dr. Sallyanne Kuster for pacemaker follow-up on October 22, 2019.  He was feeling well and only had mild shortness of breath if he try to run or walk very fast.  Pacemaker interrogation showed sinus bradycardia with second-degree AV block with Mobitz type I with ventricular rate in the high 30s to low 40s as his underlying rhythm.  He had 89% atrial pacing and 95% ventricular pacing.  Atrial fibrillation burden remains very low at less than 1% but he had several episodes that were prolonged episodes of asymptomatic PAF longest lasting >3 hours.  Presently he feels well.  He denies chest pain PND orthopnea.  He is not having any bleeding.  He  presents for evaluation.   Past Medical History:  Diagnosis Date  . CAD (coronary artery disease) 80% stenosis diag of the LAD, 30% in OM2 branch of LCX in 2009    a. Nonobstructive CAD by cath 11/2011 with the exception of the pre-existing diagonal branch #2 lesion.  . Chronic systolic CHF (congestive heart failure) ([place])   . CKD (chronic kidney disease) stage 3, GFR 30-59 ml/min   . Colon polyp, hyperplastic   . History of stress test 06/01/2012   Normal myocardial perfusion study. compared to the previous study there is no significant change. this is a low risk scan  . Hypertension   . Legally blind    "both eyes"  . Myocardial infarction ([place]) 11/22/11  . NICM (nonischemic cardiomyopathy) ([place])    a. Remote hx of dilated NICM with EF ranging 20-45%, including normal EF by echo (55-60%) in 2014.  Marland Kitchen Peripheral arterial disease ([place])    a. 06/2014: ABI right 0.99, left 1.2, LE dopplers revealing an occluded right posterior tibial. As symptoms were not felt r/t claudication, no further w/u at the time.  Marland Kitchen PVC's (premature  ventricular contractions)   . 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 Requiring discontinuation of BB dose.  Marland Kitchen Spondylolisthesis   . Ventricular bigeminy   . Ventricular tachycardia (paroxysmal) ([place]) 04/11/2015    Past Surgical History:  Procedure Laterality Date  . BACK SURGERY    . CARDIAC CATHETERIZATION  11/2011  . CARDIAC CATHETERIZATION  11/2011   didn't demonstrate high grade obstructive disease to account for his LV dysfunction.  Marland Kitchen CATARACT EXTRACTION, BILATERAL  1990's  . CYSTOSCOPY    . CYSTOSCOPY WITH URETHRAL DILATATION N/A 05/04/2013   Procedure: CYSTOSCOPY WITH URETHRAL DILATATION;  Surgeon: Eustace Moore, MD;  Location: [facility];  Service: Neurosurgery;  Laterality: N/A;  with insertion of foley catheter  . EP study and ablation of VT  7/13   PVC focus mapped to the right coronary cusp of the aorta, limited ablation performed due to  proximity of the focus to the right coronary artery  . EYE SURGERY    . LEFT HEART CATHETERIZATION WITH CORONARY ANGIOGRAM N/A 11/25/2011   Procedure: LEFT HEART CATHETERIZATION WITH CORONARY ANGIOGRAM;  Surgeon: Leonie Man, MD;  Location: [facility] CATH LAB;  Service: Cardiovascular;  Laterality: N/A;  . PACEMAKER IMPLANT N/A 07/12/2018   Procedure: PACEMAKER IMPLANT;  Surgeon: Sanda Klein, MD;  Location: [facility] CV LAB;  Service: Cardiovascular;  Laterality: N/A;  . POSTERIOR FUSION [place]  . ROTATOR CUFF REPAIR  2000's   left  . V-TACH ABLATION N/A 06/06/2012   Procedure: V-TACH ABLATION;  Surgeon: Thompson Grayer, MD;  Location: [facility] CATH LAB;  Service: Cardiovascular;  Laterality: N/A;    Current Medications: Outpatient Medications Prior to Visit  Medication Sig Dispense Refill  . albuterol (PROVENTIL HFA;VENTOLIN HFA) 108 (90 Base) MCG/ACT inhaler Inhale 1 puff into the lungs every 6 (six) hours as needed for wheezing or shortness of breath.    Marland Kitchen amLODipine (NORVASC) 10 MG tablet TAKE 1 TABLET(10 MG) BY MOUTH DAILY 90 tablet 2  . apixaban (ELIQUIS) 2.5 MG TABS tablet Take 1 tablet (2.5 mg total) by mouth 2 (two) times daily. 60 tablet 6  . BREO ELLIPTA 100-25 MCG/INH AEPB INL 1 PUFF PO QAM  2  . dorzolamide-timolol (COSOPT) 22.3-6.8 MG/ML ophthalmic solution Place 1 drop into both eyes daily.   4  . FLUAD 0.5 ML SUSY ADMINISTER 0.5ML IN THE MUSCLE AS DIRECTED  0  . furosemide (LASIX) 20 MG tablet TAKE 1 TABLET(20 MG) BY MOUTH DAILY 30 tablet 11  . isosorbide mononitrate (IMDUR) 30 MG 24 hr tablet TAKE 1 TABLET(30 MG) BY MOUTH DAILY 90 tablet 0  . isosorbide mononitrate (IMDUR) 30 MG 24 hr tablet TAKE 1 TABLET(30 MG) BY MOUTH DAILY 90 tablet 0  . LUMIGAN 0.01 % SOLN Place 1 drop into both eyes at bedtime.   0  . metoprolol succinate (TOPROL-XL) 25 MG 24 hr tablet Take 1.5 tablets (37.5 mg total) by mouth daily. 30 tablet 11  . nitroGLYCERIN (NITROSTAT) 0.4 MG SL tablet Place  1 tablet (0.4 mg total) under the tongue every 5 (five) minutes as needed for chest pain. 25 tablet 5  . pravastatin (PRAVACHOL) 40 MG tablet     . RESTASIS 0.05 % ophthalmic emulsion Place 1 drop into both eyes daily.  11  . spironolactone (ALDACTONE) 25 MG tablet Take 0.5 tablets (12.5 mg total) by mouth 2 (two) times daily. 30 tablet 8   No facility-administered medications prior to visit.  Allergies:   Patient has no known allergies.   Social History   Socioeconomic History  . Marital status: Legally Separated    Spouse name: Not on file  . Number of children: 3  . Years of education: Not on file  . Highest education level: Not on file  Occupational History  . Occupation: Retired    Fish farm manager: RETIRED  Social Needs  . Financial resource strain: Not on file  . Food insecurity    Worry: Not on file    Inability: Not on file  . Transportation needs    Medical: Not on file    Non-medical: Not on file  Tobacco Use  . Smoking status: Former Smoker    Years: 50.00    Types: Cigarettes    Quit date: 11/23/1999    Years since quitting: 19.9  . Smokeless tobacco: Never Used  Substance and Sexual Activity  . 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 Drug use: No  . Sexual activity: Yes  Lifestyle  . Physical activity    Days per week: Not on file    Minutes per session: Not on file  . Stress: Not on file  Relationships  . Social Herbalist on phone: Not on file    Gets together: Not on file    Attends religious service: Not on file    Active member of club or organization: Not on file    Attends meetings of clubs or organizations: Not on file    Relationship status: Not on file  Other Topics Concern  . Not on file  Social History Narrative  . Not on file    Socially, he is divorced and has 4 children, and 7 grandchildren.  Family History:  The patient's family history includes Asthma in his mother;  Other in an other family member.   ROS General: Negative; No fevers, chills, or night sweats;  HEENT: Negative; No changes in vision or hearing, sinus congestion, difficulty swallowing Pulmonary: Negative; No cough, wheezing, shortness of breath, hemoptysis Cardiovascular: Negative; No chest pain, presyncope, syncope, palpitations GI: Negative; No nausea, vomiting, diarrhea, or abdominal pain GU: Negative; No dysuria, hematuria, or difficulty voiding Musculoskeletal: Negative; no myalgias, joint pain, or weakness Hematologic/Oncology: Negative; no easy bruising, bleeding Endocrine: Negative; no heat/cold intolerance; no diabetes Neuro: Negative; no changes in balance, headaches Skin: Negative; No rashes or skin lesions Psychiatric: Negative; No behavioral problems, depression Sleep: Negative; No snoring, daytime sleepiness, hypersomnolence, bruxism, restless legs, hypnogognic hallucinations, no cataplexy Other comprehensive 14 point system review is negative.   PHYSICAL EXAM:   VS:  BP 128/80 (BP Location: Right Arm, Patient Position: Sitting, Cuff Size: Normal)   Pulse 70   Temp (!) 97 F (36.1 C)   Ht _0  (1.727 m)   Wt 172 lb (78 kg)   BMI 26.15 kg/m     Repeat blood pressure by me was 120/80  Wt Readings from Last 3 Encounters:  10/25/19 172 lb (78 kg)  10/22/19 171 lb (77.6 kg)  04/30/19 166 lb 6.4 oz (75.5 kg)    General: Alert, oriented, no distress.  Skin: normal turgor, no rashes, warm and dry HEENT: Normocephalic, atraumatic. Pupils equal round and reactive to light; sclera anicteric; extraocular muscles intact;  Nose without nasal septal hypertrophy Mouth/Parynx benign; Mallinpatti scale Neck: No JVD, no carotid bruits; normal carotid upstroke Lungs: clear to ausculatation and percussion; no wheezing  or rales Chest wall: without tenderness to palpitation Heart: PMI not displaced, RRR, s1 s2 normal, 1/6 systolic murmur, no diastolic murmur, no rubs, gallops,  thrills, or heaves Abdomen: soft, nontender; no hepatosplenomehaly, BS+; abdominal aorta nontender and not dilated by palpation. Back: no CVA tenderness Pulses 2+ Musculoskeletal: full range of motion, normal strength, no joint deformities Extremities: no clubbing cyanosis or edema, Homan's sign negative  Neurologic: grossly nonfocal; Cranial nerves grossly wnl Psychologic: Normal mood and affect   Studies/Labs Reviewed:   EKG:  EKG is ordered today.  ECG (independently read by me): AV paced rhythm at 70 bpm with 100% capture.  PR interval 184 ms, QTc interval 486 ms.  Recent Labs: BMP Latest Ref Rng & Units 04/13/2019 07/10/2018 04/07/2018  Glucose 65 - 99 mg/dL 111(H) 129(H) 89  BUN 8 - 27 mg/dL _0 Creatinine 0.76 - 1.27 mg/dL 1.49(H) 1.52(H) 1.60(H)  BUN/Creat Ratio 10 - _1 Sodium 134 - 144 mmol/L 141 144 139  Potassium 3.5 - 5.2 mmol/L 4.3 4.0 4.2  Chloride 96 - 106 mmol/L 105 108(H) 104  CO2 20 - 29 mmol/L _2 Calcium 8.6 - 10.2 mg/dL 9.5 9.2 9.7     Hepatic Function Latest Ref Rng & Units 04/13/2019 04/07/2018 01/24/2017  Total Protein 6.0 - 8.5 g/dL 7.1 7.1 7.3  Albumin 3.7 - 4.7 g/dL 4.0 4.1 3.9  AST 0 - 40 IU/L _3 ALT 0 - 44 IU/L _4 Alk Phosphatase 39 - 117 IU/L 76 73 74  Total Bilirubin 0.0 - 1.2 mg/dL 0.4 0.6 0.8  Bilirubin, Direct 0.0 - 0.3 mg/dL - - -    CBC Latest Ref Rng & Units 04/13/2019 07/10/2018 10/28/2017  WBC 3.4 - 10.8 x10E3/uL 5.6 5.3 6.9  Hemoglobin 13.0 - 17.7 g/dL 13.9 14.2 15.8  Hematocrit 37.5 - 51.0 % 41.8 42.8 45.1  Platelets 150 - 450 x10E3/uL 140(L) 146(L) 167   Lab Results  Component Value Date   MCV 91 04/13/2019   MCV 91 07/10/2018   MCV 88.1 10/28/2017   Lab Results  Component Value Date   TSH 1.520 04/13/2019   Lab Results  Component Value Date   HGBA1C 5.9 (H) 05/26/2012     BNP    Component Value Date/Time   BNP 49.9 04/09/2015 1217    ProBNP    Component Value Date/Time   PROBNP  155.4 (H) 03/19/2013 1749     Lipid Panel     Component Value Date/Time   CHOL 107 04/13/2019 1123   TRIG 85 04/13/2019 1123   HDL 50 04/13/2019 1123   CHOLHDL 2.1 04/13/2019 1123   CHOLHDL 2.2 01/24/2017 0950   VLDL 8 01/24/2017 0950   LDLCALC 40 04/13/2019 1123   LABVLDL 17 04/13/2019 1123     RADIOLOGY: No results found.   Additional studies/ records that were reviewed today include:  Study Conclusions  - Left ventricle: The cavity size was normal. Wall thickness was   normal. Systolic function was normal. The estimated ejection   fraction was in the range of 50% to 55%. Wall motion was normal;   there were no regional wall motion abnormalities. Features are   consistent with a pseudonormal left ventricular filling pattern,   with concomitant abnormal relaxation and increased filling   pressure (grade 2 diastolic dysfunction). - Left atrium: The atrium was mildly dilated. - Right ventricle: The cavity size was mildly dilated. - Right atrium:  The atrium was mildly dilated. - Pulmonary arteries: Systolic pressure was mildly increased. PA   peak pressure: 39 mm Hg (S).  Impressions:  - Normal LV systolic function; mild diastolic dysfunction; mild   LAE; mild RAE/RVE; mild TR with mild pulmonary hypertension.  ------------------------------------------------------------------- Labs, prior tests, procedures, and surgery: Transthoracic echocardiography (04/10/2015).     EF was 45%.   ASSESSMENT:    1. Paroxysmal atrial fibrillation (HCC)   2. Mobitz type 1 second degree AV block   3. Pacemaker   4. Coronary artery disease of native artery of native heart with stable angina pectoris (HCC)   5. Stage 3a chronic kidney disease   6. Essential hypertension   7. Nonischemic cardiomyopathy ([place])   8. Anticoagulated      PLAN:  1. PAF: He had recent 2 lengthy episodes of asymptomatic atrial fibrillation.  He is now on Eliquis anticoagulation with his  CHA2DS2-VASc score of 5.  2.  Underlying second-degree AV block: ECG today shows atrial sensing and 100% ventricular pacing.  3.  Nonischemic cardiomyopathy: Initial EF in 2012 was 20 to 25%.  Most recent echo in 2019 showed EF 50 to 55%.  He is euvolemic on exam on his regimen consisting of furosemide 20 mg daily, metoprolol succinate 37.5 mg, spironolactone 12.5 mg twice a day, in addition to his amlodipine 10 mg  4.  Essential hypertension: Blood pressure today is now excellent on his current treatment.  5.  Chronic kidney disease: Stage III with creatinines in the 1.5 range  6.  Nonobstructive CAD: No current anginal symptomatology on isosorbide, amlodipine and Toprol-XL.  7.  Hyperlipidemia: Target LDL less than 70.  Currently on pravastatin 40 mg.  LDL cholesterol 52 on October 12, 2019.  8.  Anticoagulated on Eliquis: No bleeding    Medication Adjustments/Labs and Tests Ordered: Current medicines are reviewed at length with the patient today.  Concerns regarding medicines are outlined above.  Medication changes, Labs and Tests ordered today are listed in the Patient Instructions below. Patient Instructions  Medication Instructions:  Continue current medications  *If you need a refill on your cardiac medications before your next appointment, please call your pharmacy*  Lab Work: None Ordered  Testing/Procedures: None ordered  Follow-Up: At [facility], you and your health needs are our priority.  As part of our continuing mission to provide you with exceptional heart care, we have created designated Provider Care Teams.  These Care Teams include your primary Cardiologist (physician) and Advanced Practice Providers (APPs -  Physician Assistants and Nurse Practitioners) who all work together to provide you with the care you need, when you need it.  Your next appointment:   6 month(s)  The format for your next appointment:   In Person  Provider:   Shelva Majestic, MD        Signed, Shelva Majestic, MD  10/27/2019 10:56 AM    [facility] [address], [place], [place], [place]  [address] Phone: [phone]

## 2019-10-25 NOTE — Patient Instructions (Signed)
Medication Instructions:  Continue current medications  *If you need a refill on your cardiac medications before your next appointment, please call your pharmacy*  Lab Work: None Ordered  Testing/Procedures: None ordered  Follow-Up: At [facility], you and your health needs are our priority.  As part of our continuing mission to provide you with exceptional heart care, we have created designated Provider Care Teams.  These Care Teams include your primary Cardiologist (physician) and Advanced Practice Providers (APPs -  Physician Assistants and Nurse Practitioners) who all work together to provide you with the care you need, when you need it.  Your next appointment:   6 month(s)  The format for your next appointment:   In Person  Provider:   Shelva Majestic, MD

## 2019-10-27 ENCOUNTER — Encounter: Payer: Self-pay | Admitting: Cardiovascular Disease

## 2019-11-06 ENCOUNTER — Emergency Department (HOSPITAL_COMMUNITY): Payer: Medicare Other

## 2019-11-06 ENCOUNTER — Other Ambulatory Visit: Payer: Self-pay

## 2019-11-06 ENCOUNTER — Inpatient Hospital Stay (HOSPITAL_COMMUNITY)
Admission: EM | Admit: 2019-11-06 | Discharge: 2019-11-09 | DRG: 224 | Disposition: A | Discharge status: Home Health | Payer: Medicare Other | Attending: Internal Medicine | Admitting: Internal Medicine

## 2019-11-06 ENCOUNTER — Encounter (HOSPITAL_COMMUNITY): Payer: Self-pay

## 2019-11-06 DIAGNOSIS — H548 Legal blindness, as defined in USA: Secondary | ICD-10-CM | POA: Diagnosis present

## 2019-11-06 DIAGNOSIS — R079 Chest pain, unspecified: Secondary | ICD-10-CM | POA: Diagnosis not present

## 2019-11-06 DIAGNOSIS — I441 Atrioventricular block, second degree: Secondary | ICD-10-CM | POA: Diagnosis not present

## 2019-11-06 DIAGNOSIS — B351 Tinea unguium: Secondary | ICD-10-CM

## 2019-11-06 DIAGNOSIS — I447 Left bundle-branch block, unspecified: Secondary | ICD-10-CM | POA: Diagnosis not present

## 2019-11-06 DIAGNOSIS — I2510 Atherosclerotic heart disease of native coronary artery without angina pectoris: Secondary | ICD-10-CM | POA: Diagnosis present

## 2019-11-06 DIAGNOSIS — K219 Gastro-esophageal reflux disease without esophagitis: Secondary | ICD-10-CM | POA: Diagnosis present

## 2019-11-06 DIAGNOSIS — Z950 Presence of cardiac pacemaker: Secondary | ICD-10-CM

## 2019-11-06 DIAGNOSIS — I25110 Atherosclerotic heart disease of native coronary artery with unstable angina pectoris: Secondary | ICD-10-CM | POA: Diagnosis not present

## 2019-11-06 DIAGNOSIS — Z95810 Presence of automatic (implantable) cardiac defibrillator: Secondary | ICD-10-CM | POA: Diagnosis not present

## 2019-11-06 DIAGNOSIS — I5032 Chronic diastolic (congestive) heart failure: Secondary | ICD-10-CM | POA: Diagnosis not present

## 2019-11-06 DIAGNOSIS — I472 Ventricular tachycardia, unspecified: Secondary | ICD-10-CM

## 2019-11-06 DIAGNOSIS — I130 Hypertensive heart and chronic kidney disease with heart failure and stage 1 through stage 4 chronic kidney disease, or unspecified chronic kidney disease: Secondary | ICD-10-CM | POA: Diagnosis not present

## 2019-11-06 DIAGNOSIS — N183 Chronic kidney disease, stage 3 unspecified: Secondary | ICD-10-CM | POA: Diagnosis present

## 2019-11-06 DIAGNOSIS — Z981 Arthrodesis status: Secondary | ICD-10-CM | POA: Diagnosis not present

## 2019-11-06 DIAGNOSIS — R55 Syncope and collapse: Secondary | ICD-10-CM

## 2019-11-06 DIAGNOSIS — Z8719 Personal history of other diseases of the digestive system: Secondary | ICD-10-CM

## 2019-11-06 DIAGNOSIS — I495 Sick sinus syndrome: Secondary | ICD-10-CM | POA: Diagnosis present

## 2019-11-06 DIAGNOSIS — E785 Hyperlipidemia, unspecified: Secondary | ICD-10-CM | POA: Diagnosis present

## 2019-11-06 DIAGNOSIS — Z20828 Contact with and (suspected) exposure to other viral communicable diseases: Secondary | ICD-10-CM | POA: Diagnosis present

## 2019-11-06 DIAGNOSIS — R519 Headache, unspecified: Secondary | ICD-10-CM | POA: Diagnosis not present

## 2019-11-06 DIAGNOSIS — S82832D Other fracture of upper and lower end of left fibula, subsequent encounter for closed fracture with routine healing: Secondary | ICD-10-CM

## 2019-11-06 DIAGNOSIS — S82839A Other fracture of upper and lower end of unspecified fibula, initial encounter for closed fracture: Secondary | ICD-10-CM | POA: Diagnosis present

## 2019-11-06 DIAGNOSIS — I480 Paroxysmal atrial fibrillation: Secondary | ICD-10-CM

## 2019-11-06 DIAGNOSIS — S82832A Other fracture of upper and lower end of left fibula, initial encounter for closed fracture: Secondary | ICD-10-CM | POA: Diagnosis not present

## 2019-11-06 DIAGNOSIS — Z79899 Other long term (current) drug therapy: Secondary | ICD-10-CM

## 2019-11-06 DIAGNOSIS — N1832 Chronic kidney disease, stage 3b: Secondary | ICD-10-CM | POA: Diagnosis present

## 2019-11-06 DIAGNOSIS — I5043 Acute on chronic combined systolic (congestive) and diastolic (congestive) heart failure: Secondary | ICD-10-CM | POA: Diagnosis not present

## 2019-11-06 DIAGNOSIS — I252 Old myocardial infarction: Secondary | ICD-10-CM

## 2019-11-06 DIAGNOSIS — I5022 Chronic systolic (congestive) heart failure: Secondary | ICD-10-CM | POA: Diagnosis not present

## 2019-11-06 DIAGNOSIS — M79675 Pain in left toe(s): Secondary | ICD-10-CM

## 2019-11-06 DIAGNOSIS — I255 Ischemic cardiomyopathy: Secondary | ICD-10-CM | POA: Diagnosis not present

## 2019-11-06 DIAGNOSIS — W19XXXA Unspecified fall, initial encounter: Secondary | ICD-10-CM

## 2019-11-06 DIAGNOSIS — I739 Peripheral vascular disease, unspecified: Secondary | ICD-10-CM | POA: Diagnosis not present

## 2019-11-06 DIAGNOSIS — Z87891 Personal history of nicotine dependence: Secondary | ICD-10-CM | POA: Diagnosis not present

## 2019-11-06 DIAGNOSIS — N390 Urinary tract infection, site not specified: Secondary | ICD-10-CM

## 2019-11-06 DIAGNOSIS — R42 Dizziness and giddiness: Secondary | ICD-10-CM | POA: Diagnosis not present

## 2019-11-06 DIAGNOSIS — Z7901 Long term (current) use of anticoagulants: Secondary | ICD-10-CM

## 2019-11-06 DIAGNOSIS — N1831 Chronic kidney disease, stage 3a: Secondary | ICD-10-CM | POA: Diagnosis not present

## 2019-11-06 DIAGNOSIS — I4729 Other ventricular tachycardia: Secondary | ICD-10-CM

## 2019-11-06 DIAGNOSIS — M79672 Pain in left foot: Secondary | ICD-10-CM | POA: Diagnosis not present

## 2019-11-06 LAB — URINALYSIS, ROUTINE W REFLEX MICROSCOPIC
Bilirubin Urine: NEGATIVE
Glucose, UA: NEGATIVE mg/dL
Hgb urine dipstick: NEGATIVE
Ketones, ur: NEGATIVE mg/dL
Nitrite: POSITIVE — AB
Protein, ur: 100 mg/dL — AB
Specific Gravity, Urine: 1.015 (ref 1.005–1.030)
pH: 6 (ref 5.0–8.0)

## 2019-11-06 LAB — CBC
HCT: 44 % (ref 39.0–52.0)
Hemoglobin: 15.1 g/dL (ref 13.0–17.0)
MCH: 31.1 pg (ref 26.0–34.0)
MCHC: 34.3 g/dL (ref 30.0–36.0)
MCV: 90.5 fL (ref 80.0–100.0)
Platelets: 158 10*3/uL (ref 150–400)
RBC: 4.86 MIL/uL (ref 4.22–5.81)
RDW: 12.4 % (ref 11.5–15.5)
WBC: 5.7 10*3/uL (ref 4.0–10.5)
nRBC: 0 % (ref 0.0–0.2)

## 2019-11-06 LAB — BASIC METABOLIC PANEL
Anion gap: 9 (ref 5–15)
BUN: 15 mg/dL (ref 8–23)
CO2: 25 mmol/L (ref 22–32)
Calcium: 9.5 mg/dL (ref 8.9–10.3)
Chloride: 107 mmol/L (ref 98–111)
Creatinine, Ser: 1.69 mg/dL — ABNORMAL HIGH (ref 0.61–1.24)
GFR calc Af Amer: 43 mL/min — ABNORMAL LOW (ref >60)
GFR calc non Af Amer: 38 mL/min — ABNORMAL LOW (ref >60)
Glucose, Bld: 118 mg/dL — ABNORMAL HIGH (ref 70–99)
Potassium: 4.4 mmol/L (ref 3.5–5.1)
Sodium: 141 mmol/L (ref 135–145)

## 2019-11-06 LAB — TROPONIN I (HIGH SENSITIVITY)
Troponin I (High Sensitivity): 27 ng/L — ABNORMAL HIGH (ref <18)
Troponin I (High Sensitivity): 75 ng/L — ABNORMAL HIGH (ref <18)

## 2019-11-06 LAB — CBG MONITORING, ED: Glucose-Capillary: 96 mg/dL (ref 70–99)

## 2019-11-06 NOTE — ED Triage Notes (Signed)
Pt reports syncopal episode in a parking lot, felt dizzy just prior to passing out. Pt hit the back of head on concrete. No bruising or abrasion noted. Pt also c.o left ankle pain from fall. Pt a.o at this time.

## 2019-11-07 ENCOUNTER — Encounter (HOSPITAL_COMMUNITY)
Admission: EM | Disposition: A | Discharge status: Home Health | Payer: Self-pay | Source: Home / Self Care | Attending: Internal Medicine

## 2019-11-07 ENCOUNTER — Encounter (HOSPITAL_COMMUNITY): Payer: Self-pay | Admitting: Family Medicine

## 2019-11-07 ENCOUNTER — Inpatient Hospital Stay (HOSPITAL_COMMUNITY): Payer: Medicare Other

## 2019-11-07 ENCOUNTER — Emergency Department (HOSPITAL_COMMUNITY): Payer: Medicare Other

## 2019-11-07 DIAGNOSIS — I130 Hypertensive heart and chronic kidney disease with heart failure and stage 1 through stage 4 chronic kidney disease, or unspecified chronic kidney disease: Secondary | ICD-10-CM | POA: Diagnosis not present

## 2019-11-07 DIAGNOSIS — N390 Urinary tract infection, site not specified: Secondary | ICD-10-CM | POA: Diagnosis not present

## 2019-11-07 DIAGNOSIS — R55 Syncope and collapse: Secondary | ICD-10-CM | POA: Diagnosis not present

## 2019-11-07 DIAGNOSIS — I5032 Chronic diastolic (congestive) heart failure: Secondary | ICD-10-CM

## 2019-11-07 DIAGNOSIS — Z981 Arthrodesis status: Secondary | ICD-10-CM | POA: Diagnosis not present

## 2019-11-07 DIAGNOSIS — S82832A Other fracture of upper and lower end of left fibula, initial encounter for closed fracture: Secondary | ICD-10-CM | POA: Diagnosis not present

## 2019-11-07 DIAGNOSIS — N1831 Chronic kidney disease, stage 3a: Secondary | ICD-10-CM

## 2019-11-07 DIAGNOSIS — I25110 Atherosclerotic heart disease of native coronary artery with unstable angina pectoris: Secondary | ICD-10-CM

## 2019-11-07 DIAGNOSIS — K219 Gastro-esophageal reflux disease without esophagitis: Secondary | ICD-10-CM | POA: Diagnosis present

## 2019-11-07 DIAGNOSIS — Z20828 Contact with and (suspected) exposure to other viral communicable diseases: Secondary | ICD-10-CM | POA: Diagnosis present

## 2019-11-07 DIAGNOSIS — Z8719 Personal history of other diseases of the digestive system: Secondary | ICD-10-CM | POA: Diagnosis not present

## 2019-11-07 DIAGNOSIS — M79672 Pain in left foot: Secondary | ICD-10-CM | POA: Diagnosis not present

## 2019-11-07 DIAGNOSIS — Z87891 Personal history of nicotine dependence: Secondary | ICD-10-CM | POA: Diagnosis not present

## 2019-11-07 DIAGNOSIS — I441 Atrioventricular block, second degree: Secondary | ICD-10-CM | POA: Diagnosis present

## 2019-11-07 DIAGNOSIS — Z79899 Other long term (current) drug therapy: Secondary | ICD-10-CM | POA: Diagnosis not present

## 2019-11-07 DIAGNOSIS — N183 Chronic kidney disease, stage 3 unspecified: Secondary | ICD-10-CM | POA: Diagnosis present

## 2019-11-07 DIAGNOSIS — I480 Paroxysmal atrial fibrillation: Secondary | ICD-10-CM | POA: Diagnosis present

## 2019-11-07 DIAGNOSIS — Z7901 Long term (current) use of anticoagulants: Secondary | ICD-10-CM | POA: Diagnosis not present

## 2019-11-07 DIAGNOSIS — I252 Old myocardial infarction: Secondary | ICD-10-CM | POA: Diagnosis not present

## 2019-11-07 DIAGNOSIS — E785 Hyperlipidemia, unspecified: Secondary | ICD-10-CM | POA: Diagnosis present

## 2019-11-07 DIAGNOSIS — Z95810 Presence of automatic (implantable) cardiac defibrillator: Secondary | ICD-10-CM | POA: Diagnosis not present

## 2019-11-07 DIAGNOSIS — I5022 Chronic systolic (congestive) heart failure: Secondary | ICD-10-CM | POA: Diagnosis not present

## 2019-11-07 DIAGNOSIS — I255 Ischemic cardiomyopathy: Secondary | ICD-10-CM | POA: Diagnosis present

## 2019-11-07 DIAGNOSIS — I472 Ventricular tachycardia: Principal | ICD-10-CM

## 2019-11-07 DIAGNOSIS — H548 Legal blindness, as defined in USA: Secondary | ICD-10-CM | POA: Diagnosis present

## 2019-11-07 DIAGNOSIS — I2510 Atherosclerotic heart disease of native coronary artery without angina pectoris: Secondary | ICD-10-CM | POA: Diagnosis present

## 2019-11-07 DIAGNOSIS — I447 Left bundle-branch block, unspecified: Secondary | ICD-10-CM | POA: Diagnosis present

## 2019-11-07 DIAGNOSIS — I495 Sick sinus syndrome: Secondary | ICD-10-CM | POA: Diagnosis present

## 2019-11-07 DIAGNOSIS — I5043 Acute on chronic combined systolic (congestive) and diastolic (congestive) heart failure: Secondary | ICD-10-CM | POA: Diagnosis not present

## 2019-11-07 DIAGNOSIS — I739 Peripheral vascular disease, unspecified: Secondary | ICD-10-CM | POA: Diagnosis present

## 2019-11-07 DIAGNOSIS — S82839A Other fracture of upper and lower end of unspecified fibula, initial encounter for closed fracture: Secondary | ICD-10-CM | POA: Diagnosis present

## 2019-11-07 HISTORY — PX: LEFT HEART CATH AND CORONARY ANGIOGRAPHY: CATH118249

## 2019-11-07 LAB — SARS CORONAVIRUS 2 (TAT 6-24 HRS): SARS Coronavirus 2: NEGATIVE

## 2019-11-07 LAB — TROPONIN I (HIGH SENSITIVITY)
Troponin I (High Sensitivity): 35 ng/L — ABNORMAL HIGH (ref <18)
Troponin I (High Sensitivity): 27 ng/L — ABNORMAL HIGH (ref <18)

## 2019-11-07 LAB — ECHOCARDIOGRAM COMPLETE
Height: 68 in
Weight: 2736 oz

## 2019-11-07 SURGERY — LEFT HEART CATH AND CORONARY ANGIOGRAPHY
Anesthesia: LOCAL

## 2019-11-07 MED ORDER — HEPARIN (PORCINE) IN NACL 1000-0.9 UT/500ML-% IV SOLN
INTRAVENOUS | Status: DC | PRN
  Administered 2019-11-07: 500 mL

## 2019-11-07 MED ORDER — SODIUM CHLORIDE 0.9 % IV SOLN: 250.0000 mL | INTRAVENOUS | Status: DC | PRN

## 2019-11-07 MED ORDER — VERAPAMIL HCL 2.5 MG/ML IV SOLN
INTRAVENOUS | Status: DC | PRN
  Administered 2019-11-07: 10 mL via INTRA_ARTERIAL

## 2019-11-07 MED ORDER — MORPHINE SULFATE (PF) 4 MG/ML IV SOLN
3.0000 mg | INTRAVENOUS | Status: DC | PRN
  Administered 2019-11-07: 3 mg via INTRAVENOUS
  Filled 2019-11-07: qty 1

## 2019-11-07 MED ORDER — HEPARIN SODIUM (PORCINE) 1000 UNIT/ML IJ SOLN
INTRAMUSCULAR | Status: AC
  Filled 2019-11-07: qty 1

## 2019-11-07 MED ORDER — FENTANYL CITRATE (PF) 100 MCG/2ML IJ SOLN
INTRAMUSCULAR | Status: DC | PRN
  Administered 2019-11-07: 25 ug via INTRAVENOUS

## 2019-11-07 MED ORDER — POLYETHYLENE GLYCOL 3350 17 G PO PACK: 17.0000 g | PACK | Freq: Every day | ORAL | Status: DC | PRN

## 2019-11-07 MED ORDER — SODIUM CHLORIDE 0.9% FLUSH: 3.0000 mL | INTRAVENOUS | Status: DC | PRN

## 2019-11-07 MED ORDER — IOHEXOL 350 MG/ML SOLN
INTRAVENOUS | Status: DC | PRN
  Administered 2019-11-07: 20 mL

## 2019-11-07 MED ORDER — SODIUM CHLORIDE 0.9 % IV SOLN
1.0000 g | Freq: Once | INTRAVENOUS | Status: AC
  Administered 2019-11-07: 1 g via INTRAVENOUS
  Filled 2019-11-07: qty 10

## 2019-11-07 MED ORDER — SODIUM CHLORIDE 0.9% FLUSH: 3.0000 mL | Freq: Two times a day (BID) | INTRAVENOUS | Status: DC

## 2019-11-07 MED ORDER — SODIUM CHLORIDE 0.9 % WEIGHT BASED INFUSION: 1.0000 mL/kg/h | INTRAVENOUS | Status: AC

## 2019-11-07 MED ORDER — HYDROCODONE-ACETAMINOPHEN 5-325 MG PO TABS
1.0000 | ORAL_TABLET | ORAL | Status: DC | PRN
  Administered 2019-11-07 – 2019-11-08 (×2): 1 via ORAL
  Filled 2019-11-07 (×2): qty 1

## 2019-11-07 MED ORDER — SODIUM CHLORIDE 0.9 % IV SOLN
INTRAVENOUS | Status: AC | PRN
  Administered 2019-11-07: 1 mL/kg/h via INTRAVENOUS

## 2019-11-07 MED ORDER — HEPARIN (PORCINE) IN NACL 1000-0.9 UT/500ML-% IV SOLN
INTRAVENOUS | Status: AC
  Filled 2019-11-07: qty 1000

## 2019-11-07 MED ORDER — ACETAMINOPHEN 650 MG RE SUPP: 650.0000 mg | Freq: Four times a day (QID) | RECTAL | Status: DC | PRN

## 2019-11-07 MED ORDER — METOPROLOL SUCCINATE ER 25 MG PO TB24: 37.5000 mg | ORAL_TABLET | Freq: Every day | ORAL | Status: DC

## 2019-11-07 MED ORDER — SODIUM CHLORIDE 0.9 % IV SOLN: INTRAVENOUS | Status: DC

## 2019-11-07 MED ORDER — ASPIRIN 81 MG PO CHEW
324.0000 mg | CHEWABLE_TABLET | Freq: Once | ORAL | Status: AC
  Administered 2019-11-07: 324 mg via ORAL
  Filled 2019-11-07: qty 4

## 2019-11-07 MED ORDER — LIDOCAINE HCL (PF) 1 % IJ SOLN
INTRAMUSCULAR | Status: AC
  Filled 2019-11-07: qty 30

## 2019-11-07 MED ORDER — ACETAMINOPHEN 325 MG PO TABS: 650.0000 mg | ORAL_TABLET | Freq: Four times a day (QID) | ORAL | Status: DC | PRN

## 2019-11-07 MED ORDER — FENTANYL CITRATE (PF) 100 MCG/2ML IJ SOLN
INTRAMUSCULAR | Status: AC
  Filled 2019-11-07: qty 2

## 2019-11-07 MED ORDER — CYCLOSPORINE 0.05 % OP EMUL
1.0000 [drp] | Freq: Every day | OPHTHALMIC | Status: DC
  Administered 2019-11-09: 1 [drp] via OPHTHALMIC
  Filled 2019-11-07 (×3): qty 30

## 2019-11-07 MED ORDER — MIDAZOLAM HCL 2 MG/2ML IJ SOLN
INTRAMUSCULAR | Status: AC
  Filled 2019-11-07: qty 2

## 2019-11-07 MED ORDER — HYDROCODONE-ACETAMINOPHEN 5-325 MG PO TABS: 1.0000 | ORAL_TABLET | ORAL | Status: DC | PRN

## 2019-11-07 MED ORDER — HEPARIN SODIUM (PORCINE) 1000 UNIT/ML IJ SOLN
INTRAMUSCULAR | Status: DC | PRN
  Administered 2019-11-07: 4000 [IU] via INTRAVENOUS

## 2019-11-07 MED ORDER — ONDANSETRON HCL 4 MG PO TABS: 4.0000 mg | ORAL_TABLET | Freq: Four times a day (QID) | ORAL | Status: DC | PRN

## 2019-11-07 MED ORDER — LIDOCAINE HCL (PF) 1 % IJ SOLN
INTRAMUSCULAR | Status: DC | PRN
  Administered 2019-11-07: 3 mL

## 2019-11-07 MED ORDER — VERAPAMIL HCL 2.5 MG/ML IV SOLN
INTRAVENOUS | Status: AC
  Filled 2019-11-07: qty 2

## 2019-11-07 MED ORDER — ONDANSETRON HCL 4 MG/2ML IJ SOLN: 4.0000 mg | Freq: Four times a day (QID) | INTRAMUSCULAR | Status: DC | PRN

## 2019-11-07 MED ORDER — SODIUM CHLORIDE 0.9% FLUSH
3.0000 mL | Freq: Two times a day (BID) | INTRAVENOUS | Status: DC
  Administered 2019-11-08 – 2019-11-09 (×2): 3 mL via INTRAVENOUS

## 2019-11-07 MED ORDER — SODIUM CHLORIDE 0.9 % IV SOLN
1.0000 g | Freq: Every day | INTRAVENOUS | Status: DC
  Administered 2019-11-08: 1 g via INTRAVENOUS
  Filled 2019-11-07: qty 10

## 2019-11-07 MED ORDER — PRAVASTATIN SODIUM 40 MG PO TABS
40.0000 mg | ORAL_TABLET | Freq: Every day | ORAL | Status: DC
  Administered 2019-11-09: 40 mg via ORAL
  Filled 2019-11-07: qty 1

## 2019-11-07 MED ORDER — MIDAZOLAM HCL 2 MG/2ML IJ SOLN
INTRAMUSCULAR | Status: DC | PRN
  Administered 2019-11-07: 1 mg via INTRAVENOUS

## 2019-11-07 SURGICAL SUPPLY — 9 items
CATH INFINITI 5FR JK (CATHETERS) ×1 IMPLANT
DEVICE RAD COMP TR BAND LRG (VASCULAR PRODUCTS) ×1 IMPLANT
GLIDESHEATH SLEND SS 6F .021 (SHEATH) ×1 IMPLANT
GUIDEWIRE INQWIRE 1.5J.035X260 (WIRE) IMPLANT
INQWIRE 1.5J .035X260CM (WIRE) ×2
KIT HEART LEFT (KITS) ×2 IMPLANT
PACK CARDIAC CATHETERIZATION (CUSTOM PROCEDURE TRAY) ×2 IMPLANT
TRANSDUCER W/STOPCOCK (MISCELLANEOUS) ×2 IMPLANT
TUBING CIL FLEX 10 FLL-RA (TUBING) ×2 IMPLANT

## 2019-11-07 NOTE — Progress Notes (Signed)
Orthopedic Tech Progress Note Patient Details:  [name] 1941/07/07 [id number]  Ortho Devices Type of Ortho Device: CAM walker Ortho Device/Splint Location: LLE Ortho Device/Splint Interventions: Adjustment, Application, Ordered   Post Interventions Patient Tolerated: Well Instructions Provided: Adjustment of device, Care of device   Janit Pagan 11/07/2019, 4:52 AM

## 2019-11-07 NOTE — Consult Note (Addendum)
ELECTROPHYSIOLOGY CONSULT NOTE    Patient ID: [name] MRN: [id number], DOB/AGE: 1941-02-05 78 y.o.  Admit date: 11/06/2019 Date of Consult: 11/07/2019  Primary Physician: Lucianne Lei, MD Primary Cardiologist: Shelva Majestic, MD  Electrophysiologist: Dr. Sallyanne Kuster   (Dr. Rayann Heman performed PVC ablation 2013)  Referring Provider: Dr. Zigmund Daniel  Patient Profile: [name] is a 78 y.o. 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 Zigmund Daniel.  HPI:  [name] is a 78 y.o. male with medical history above. He was taking his afternoon walk yesterday around 1430, when he had syncopal event with mechanical fall. He had mild confusion for a moment afterward, and left ankle pain. He had no prodromal symptoms.  He did have chest pain on 12/14 that lasted "most of the day" it occurred at rest or exertion, but resolved eventually with nitroglycerin. Pertinent labs on admisison include HS Troponin 27 -> 75 -> 35 -> 27. K stable at 4.4. Cr near his baseline at 1.69. CBC stable. He denies any recent viral illness, palpitations, palpitations, dyspnea, PND, orthopnea, nausea, vomiting, dizziness, edema, weight gain, or early satiety. He has not missed any of his medications.    PVC ablation 05/2012 by Dr. Rayann Heman 1. Sinus rhythm with PVCs in a bigeminal pattern arising from the right coronary cusp of the aorta, successfully ablated in this location.  Rare PVCs were observed following ablation; however, additional radiofrequency current was not delivered due to the close proximity of the PVC focus to the right coronary artery. 2. No early apparent complications.  Past Medical History:  Diagnosis Date  . CAD (coronary artery disease) 80% stenosis diag of the LAD, 30% in OM2 branch of LCX in 2009    a. Nonobstructive CAD by cath 11/2011 with the  exception of the pre-existing diagonal branch #2 lesion.  . Chronic systolic CHF (congestive heart failure) ([place])   . CKD (chronic kidney disease) stage 3, GFR 30-59 ml/min   . Colon polyp, hyperplastic   . History of stress test 06/01/2012   Normal myocardial perfusion study. compared to the previous study there is no significant change. this is a low risk scan  . Hypertension   . Legally blind    "both eyes"  . Myocardial infarction ([place]) 11/22/11  . NICM (nonischemic cardiomyopathy) ([place])    a. Remote hx of dilated NICM with EF ranging 20-45%, including normal EF by echo (55-60%) in 2014.  Marland Kitchen Peripheral arterial disease ([place])    a. 06/2014: ABI right 0.99, left 1.2, LE dopplers revealing an occluded right posterior tibial. As symptoms were not felt r/t claudication, no further w/u at the time.  Marland Kitchen PVC's (premature ventricular contractions)   . 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 Requiring discontinuation of BB dose.  Marland Kitchen Spondylolisthesis   . Ventricular bigeminy   . Ventricular tachycardia (paroxysmal) ([place]) 04/11/2015     Surgical History:  Past Surgical History:  Procedure Laterality Date  . BACK SURGERY    . CARDIAC CATHETERIZATION  11/2011  . CARDIAC CATHETERIZATION  11/2011   didn't demonstrate high grade obstructive disease to account for his LV dysfunction.  Marland Kitchen CATARACT EXTRACTION, BILATERAL  1990's  . CYSTOSCOPY    . CYSTOSCOPY WITH URETHRAL DILATATION N/A 05/04/2013   Procedure: CYSTOSCOPY WITH URETHRAL DILATATION;  Surgeon: Camelia Eng  Ronnald Ramp, MD;  Location: [facility];  Service: Neurosurgery;  Laterality: N/A;  with insertion of foley catheter  . EP study and ablation of VT  7/13   PVC focus mapped to the right coronary cusp of the aorta, limited ablation performed due to proximity of the focus to the right coronary artery  . EYE SURGERY    . LEFT HEART CATHETERIZATION WITH CORONARY ANGIOGRAM N/A 11/25/2011   Procedure: LEFT HEART CATHETERIZATION WITH CORONARY ANGIOGRAM;   Surgeon: Leonie Man, MD;  Location: [facility] CATH LAB;  Service: Cardiovascular;  Laterality: N/A;  . PACEMAKER IMPLANT N/A 07/12/2018   Procedure: PACEMAKER IMPLANT;  Surgeon: Sanda Klein, MD;  Location: [facility] CV LAB;  Service: Cardiovascular;  Laterality: N/A;  . POSTERIOR FUSION [place]  . ROTATOR CUFF REPAIR  2000's   left  . V-TACH ABLATION N/A 06/06/2012   Procedure: V-TACH ABLATION;  Surgeon: Thompson Grayer, MD;  Location: [facility] CATH LAB;  Service: Cardiovascular;  Laterality: N/A;     (Not in a hospital admission)   Inpatient Medications:   Allergies: No Known Allergies  Social History   Socioeconomic History  . Marital status: Legally Separated    Spouse name: Not on file  . Number of children: 3  . Years of education: Not on file  . Highest education level: Not on file  Occupational History  . Occupation: Retired    Fish farm manager: RETIRED  Tobacco Use  . Smoking status: Former Smoker    Years: 50.00    Types: Cigarettes    Quit date: 11/23/1999    Years since quitting: 19.9  . Smokeless tobacco: Never Used  Substance and Sexual Activity  . 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 Drug use: No  . Sexual activity: Yes  Other Topics Concern  . Not on file  Social History Narrative  . Not on file   Social Determinants of Health   Financial Resource Strain:   . Difficulty of Paying Living Expenses: Not on file  Food Insecurity:   . Worried About Charity fundraiser in the Last Year: Not on file  . Ran Out of Food in the Last Year: Not on file  Transportation Needs:   . Lack of Transportation (Medical): Not on file  . Lack of Transportation (Non-Medical): Not on file  Physical Activity:   . Days of Exercise per Week: Not on file  . Minutes of Exercise per Session: Not on file  Stress:   . Feeling of Stress : Not on file  Social Connections:   . Frequency of Communication with Friends and  Family: Not on file  . Frequency of Social Gatherings with Friends and Family: Not on file  . Attends Religious Services: Not on file  . Active Member of Clubs or Organizations: Not on file  . Attends Archivist Meetings: Not on file  . Marital Status: Not on file  Intimate Partner Violence:   . Fear of Current or Ex-Partner: Not on file  . Emotionally Abused: Not on file  . Physically Abused: Not on file  . Sexually Abused: Not on file     Family History  Problem Relation Age of Onset  . Asthma Mother   . Other Other        Unsure if any heart disease in his family.     Review of Systems: All other systems reviewed and  are otherwise negative except as noted above.  Physical Exam: Vitals:   11/07/19 0745 11/07/19 0815 11/07/19 0830 11/07/19 0845  BP: (!) 147/97 (!) 141/80 (!) 139/91 131/73  Pulse: 69 67 65 61  Resp: 18 19 15 14   Temp:      TempSrc:      SpO2: 97% 91% 99% 98%  Weight:      Height:        GEN- The patient is well appearing, alert and oriented x 3 today.   HEENT: normocephalic, atraumatic; sclera clear, conjunctiva pink; hearing intact; oropharynx clear; neck supple Lungs- Clear to ausculation bilaterally, normal work of breathing.  No wheezes, rales, rhonchi Heart- Regular rate and rhythm, no murmurs, rubs or gallops GI- soft, non-tender, non-distended, bowel sounds present Extremities- no clubbing, cyanosis, or edema; DP/PT/radial pulses 2+ bilaterally MS- no significant deformity or atrophy Skin- warm and dry, no rash or lesion Psych- euthymic mood, full affect Neuro- strength and sensation are intact  Labs:   Lab Results  Component Value Date   WBC 5.7 11/06/2019   HGB 15.1 11/06/2019   HCT 44.0 11/06/2019   MCV 90.5 11/06/2019   PLT 158 11/06/2019    Recent Labs  Lab 11/06/19 1552  NA 141  K 4.4  CL 107  CO2 25  BUN 15  CREATININE 1.69*  CALCIUM 9.5  GLUCOSE 118*      Radiology/Studies: DG Chest 2 View  Result Date:  11/06/2019 CLINICAL DATA:  Onset chest pain yesterday. EXAM: CHEST - 2 VIEW COMPARISON:  PA and lateral chest 07/13/2018 FINDINGS: Lungs appear emphysematous but are clear. Heart size is upper normal. No pneumothorax or pleural fluid. No acute or focal bony abnormality. Pacing device and spinal fusion hardware unchanged. IMPRESSION: No acute disease. Lungs appear emphysematous. Electronically Signed   By: Inge Rise M.D.   On: 11/06/2019 16:34   CT HEAD WO CONTRAST  Result Date: 11/06/2019 CLINICAL DATA:  Headache and dizziness EXAM: CT HEAD WITHOUT CONTRAST TECHNIQUE: Contiguous axial images were obtained from the base of the skull through the vertex without intravenous contrast. COMPARISON:  November 17, 2016 FINDINGS: Brain: No evidence of acute territorial infarction, hemorrhage, hydrocephalus,extra-axial collection or mass lesion/mass effect. There is dilatation the ventricles and sulci consistent with age-related atrophy. Low-attenuation changes in the deep white matter consistent with small vessel ischemia. Vascular: No hyperdense vessel or unexpected calcification. Skull: The skull is intact. No fracture or focal lesion identified. Sinuses/Orbits: The visualized paranasal sinuses and mastoid air cells are clear. The orbits and globes intact. Other: None IMPRESSION: No acute intracranial abnormality. Findings consistent with age related atrophy and chronic small vessel ischemia Electronically Signed   By: Prudencio Pair M.D.   On: 11/06/2019 16:57   DG Ankle Left Port  Result Date: 11/07/2019 CLINICAL DATA:  Syncopal episode in the parking lot leading to fall. Left foot and ankle pain. EXAM: PORTABLE LEFT ANKLE - 2 VIEW COMPARISON:  None. FINDINGS: Oblique mildly comminuted distal fibular fracture just proximal to the ankle mortise. No additional fracture. No mortise widening. Anterolateral soft tissue edema but no definite joint effusion. Small plantar calcaneal spur. IMPRESSION: Oblique  mildly comminuted distal fibular fracture just proximal to the ankle mortise. Associated soft tissue edema. Electronically Signed   By: Keith Rake M.D.   On: 11/07/2019 04:22   DG Foot Complete Left  Result Date: 11/07/2019 CLINICAL DATA:  Syncopal episode in the parking lot leading to fall. Left foot and ankle pain. EXAM: LEFT FOOT -  COMPLETE 3+ VIEW COMPARISON:  None. FINDINGS: Distal fibular fracture assessed on concurrent ankle exam. Remote fracture of the fifth metatarsal. No additional acute fracture of the foot. Mild hallux valgus and degenerative change of the first metatarsal phalangeal joint. Small plantar calcaneal spur. Anterolateral soft tissue edema. IMPRESSION: 1. No acute fracture of the foot. Distal fibular fracture assessed on concurrent ankle exam. 2. Remote fifth metatarsal fracture. Electronically Signed   By: Keith Rake M.D.   On: 11/07/2019 04:23    EKG: 11/06/2019 with AS-VP at 73 bpm with QRS of 184 ms. (personally reviewed)  TELEMETRY: V pacing 60s (personally reviewed)  DEVICE HISTORY: St Jude dual chamber PPM implantation 06/2018  Assessment/Plan: 1.  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  Prior to this episode, his VT had been quiescent on BB therapy and EF recovery. Not on AAD. He has previously tolerated amiodarone well for PVC suppression (weaned off s/p ablation) Echo 04/13/2018 with LVEF 50-55%. Repeat pending.  With known CAD and CP on 12/14, Aniella Wandrey plan for LHC today (Discussed with Dr. Curt Bears). If no significant disease/reversible cause for his VT noted on LHC, Rigo Letts need upgrade to ICD (+/- CRT with pacing induced LBBB)  2. Second degree AV block S/p St Jude dual chamber PPM as above Normal device function on interrogation.   3. CAD  Had mild chest pain 12/14, but none on day of syncope LHC 2009 with 80% diag of LAD, 30% in OM2  of LCx, LHC 11/2011 with stable CAD.   4. Paroxysmal atrial fibrillation On eliquis for CHA2DS2VASC of 5   Burden low overall (<1%)  5. Left fibular fracture Distal fibular fracture on xrays. IM managing.  For questions or updates, please contact [place] Please consult [email or web address] for contact info under Cardiology/STEMI.  Signed, Shirley Friar, PA-C  11/07/2019 9:27 AM    I have seen and examined this patient with Oda Kilts.  Agree with above, note added to reflect my findings.  On exam, RRR, no murmurs, lungs clear.  Patient presented to the hospital after an episode of syncope.  Pacemaker interrogation shows ventricular tachycardia for 30 seconds.  He does have coronary artery disease which she was stable as of last catheterization in 2013.  Due to his VT, and some mild chest discomfort, Tymel Conely plan for left heart catheterization.  Pending left heart catheterization, he [place] Natarajan likely need device upgrade to an ICD.  He may also benefit from amiodarone as an outpatient.  Ishi Danser M. Luan Urbani MD 11/07/2019 3:04 PM

## 2019-11-07 NOTE — H&P (View-Only) (Signed)
ELECTROPHYSIOLOGY CONSULT NOTE    Patient ID: [name] MRN: [id number], DOB/AGE: 05-14-1941 78 y.o.  Admit date: 11/06/2019 Date of Consult: 11/07/2019  Primary Physician: Lucianne Lei, MD Primary Cardiologist: Shelva Majestic, MD  Electrophysiologist: Dr. Sallyanne Kuster   (Dr. Rayann Heman performed PVC ablation 2013)  Referring Provider: Dr. Zigmund Daniel  Patient Profile: [name] is a 78 y.o. 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 Zigmund Daniel.  HPI:  [name] is a 78 y.o. male with medical history above. He was taking his afternoon walk yesterday around 1430, when he had syncopal event with mechanical fall. He had mild confusion for a moment afterward, and left ankle pain. He had no prodromal symptoms.  He did have chest pain on 12/14 that lasted "most of the day" it occurred at rest or exertion, but resolved eventually with nitroglycerin. Pertinent labs on admisison include HS Troponin 27 -> 75 -> 35 -> 27. K stable at 4.4. Cr near his baseline at 1.69. CBC stable. He denies any recent viral illness, palpitations, palpitations, dyspnea, PND, orthopnea, nausea, vomiting, dizziness, edema, weight gain, or early satiety. He has not missed any of his medications.    PVC ablation 05/2012 by Dr. Rayann Heman 1. Sinus rhythm with PVCs in a bigeminal pattern arising from the right coronary cusp of the aorta, successfully ablated in this location.  Rare PVCs were observed following ablation; however, additional radiofrequency current was not delivered due to the close proximity of the PVC focus to the right coronary artery. 2. No early apparent complications.  Past Medical History:  Diagnosis Date  . CAD (coronary artery disease) 80% stenosis diag of the LAD, 30% in OM2 branch of LCX in 2009    a. Nonobstructive CAD by cath 11/2011 with the  exception of the pre-existing diagonal branch #2 lesion.  . Chronic systolic CHF (congestive heart failure) ([place])   . CKD (chronic kidney disease) stage 3, GFR 30-59 ml/min   . Colon polyp, hyperplastic   . History of stress test 06/01/2012   Normal myocardial perfusion study. compared to the previous study there is no significant change. this is a low risk scan  . Hypertension   . Legally blind    "both eyes"  . Myocardial infarction ([place]) 11/22/11  . NICM (nonischemic cardiomyopathy) ([place])    a. Remote hx of dilated NICM with EF ranging 20-45%, including normal EF by echo (55-60%) in 2014.  Marland Kitchen Peripheral arterial disease ([place])    a. 06/2014: ABI right 0.99, left 1.2, LE dopplers revealing an occluded right posterior tibial. As symptoms were not felt r/t claudication, no further w/u at the time.  Marland Kitchen PVC's (premature ventricular contractions)   . 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 Requiring discontinuation of BB dose.  Marland Kitchen Spondylolisthesis   . Ventricular bigeminy   . Ventricular tachycardia (paroxysmal) ([place]) 04/11/2015     Surgical History:  Past Surgical History:  Procedure Laterality Date  . BACK SURGERY    . CARDIAC CATHETERIZATION  11/2011  . CARDIAC CATHETERIZATION  11/2011   didn't demonstrate high grade obstructive disease to account for his LV dysfunction.  Marland Kitchen CATARACT EXTRACTION, BILATERAL  1990's  . CYSTOSCOPY    . CYSTOSCOPY WITH URETHRAL DILATATION N/A 05/04/2013   Procedure: CYSTOSCOPY WITH URETHRAL DILATATION;  Surgeon: Camelia Eng  Ronnald Ramp, MD;  Location: [facility];  Service: Neurosurgery;  Laterality: N/A;  with insertion of foley catheter  . EP study and ablation of VT  7/13   PVC focus mapped to the right coronary cusp of the aorta, limited ablation performed due to proximity of the focus to the right coronary artery  . EYE SURGERY    . LEFT HEART CATHETERIZATION WITH CORONARY ANGIOGRAM N/A 11/25/2011   Procedure: LEFT HEART CATHETERIZATION WITH CORONARY ANGIOGRAM;   Surgeon: Leonie Man, MD;  Location: [facility] CATH LAB;  Service: Cardiovascular;  Laterality: N/A;  . PACEMAKER IMPLANT N/A 07/12/2018   Procedure: PACEMAKER IMPLANT;  Surgeon: Sanda Klein, MD;  Location: [facility] CV LAB;  Service: Cardiovascular;  Laterality: N/A;  . POSTERIOR FUSION [place]  . ROTATOR CUFF REPAIR  2000's   left  . V-TACH ABLATION N/A 06/06/2012   Procedure: V-TACH ABLATION;  Surgeon: Thompson Grayer, MD;  Location: [facility] CATH LAB;  Service: Cardiovascular;  Laterality: N/A;     (Not in a hospital admission)   Inpatient Medications:   Allergies: No Known Allergies  Social History   Socioeconomic History  . Marital status: Legally Separated    Spouse name: Not on file  . Number of children: 3  . Years of education: Not on file  . Highest education level: Not on file  Occupational History  . Occupation: Retired    Fish farm manager: RETIRED  Tobacco Use  . Smoking status: Former Smoker    Years: 50.00    Types: Cigarettes    Quit date: 11/23/1999    Years since quitting: 19.9  . Smokeless tobacco: Never Used  Substance and Sexual Activity  . 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 Drug use: No  . Sexual activity: Yes  Other Topics Concern  . Not on file  Social History Narrative  . Not on file   Social Determinants of Health   Financial Resource Strain:   . Difficulty of Paying Living Expenses: Not on file  Food Insecurity:   . Worried About Charity fundraiser in the Last Year: Not on file  . Ran Out of Food in the Last Year: Not on file  Transportation Needs:   . Lack of Transportation (Medical): Not on file  . Lack of Transportation (Non-Medical): Not on file  Physical Activity:   . Days of Exercise per Week: Not on file  . Minutes of Exercise per Session: Not on file  Stress:   . Feeling of Stress : Not on file  Social Connections:   . Frequency of Communication with Friends and  Family: Not on file  . Frequency of Social Gatherings with Friends and Family: Not on file  . Attends Religious Services: Not on file  . Active Member of Clubs or Organizations: Not on file  . Attends Archivist Meetings: Not on file  . Marital Status: Not on file  Intimate Partner Violence:   . Fear of Current or Ex-Partner: Not on file  . Emotionally Abused: Not on file  . Physically Abused: Not on file  . Sexually Abused: Not on file     Family History  Problem Relation Age of Onset  . Asthma Mother   . Other Other        Unsure if any heart disease in his family.     Review of Systems: All other systems reviewed and  are otherwise negative except as noted above.  Physical Exam: Vitals:   11/07/19 0745 11/07/19 0815 11/07/19 0830 11/07/19 0845  BP: (!) 147/97 (!) 141/80 (!) 139/91 131/73  Pulse: 69 67 65 61  Resp: 18 19 15 14   Temp:      TempSrc:      SpO2: 97% 91% 99% 98%  Weight:      Height:        GEN- The patient is well appearing, alert and oriented x 3 today.   HEENT: normocephalic, atraumatic; sclera clear, conjunctiva pink; hearing intact; oropharynx clear; neck supple Lungs- Clear to ausculation bilaterally, normal work of breathing.  No wheezes, rales, rhonchi Heart- Regular rate and rhythm, no murmurs, rubs or gallops GI- soft, non-tender, non-distended, bowel sounds present Extremities- no clubbing, cyanosis, or edema; DP/PT/radial pulses 2+ bilaterally MS- no significant deformity or atrophy Skin- warm and dry, no rash or lesion Psych- euthymic mood, full affect Neuro- strength and sensation are intact  Labs:   Lab Results  Component Value Date   WBC 5.7 11/06/2019   HGB 15.1 11/06/2019   HCT 44.0 11/06/2019   MCV 90.5 11/06/2019   PLT 158 11/06/2019    Recent Labs  Lab 11/06/19 1552  NA 141  K 4.4  CL 107  CO2 25  BUN 15  CREATININE 1.69*  CALCIUM 9.5  GLUCOSE 118*      Radiology/Studies: DG Chest 2 View  Result Date:  11/06/2019 CLINICAL DATA:  Onset chest pain yesterday. EXAM: CHEST - 2 VIEW COMPARISON:  PA and lateral chest 07/13/2018 FINDINGS: Lungs appear emphysematous but are clear. Heart size is upper normal. No pneumothorax or pleural fluid. No acute or focal bony abnormality. Pacing device and spinal fusion hardware unchanged. IMPRESSION: No acute disease. Lungs appear emphysematous. Electronically Signed   By: Inge Rise M.D.   On: 11/06/2019 16:34   CT HEAD WO CONTRAST  Result Date: 11/06/2019 CLINICAL DATA:  Headache and dizziness EXAM: CT HEAD WITHOUT CONTRAST TECHNIQUE: Contiguous axial images were obtained from the base of the skull through the vertex without intravenous contrast. COMPARISON:  November 17, 2016 FINDINGS: Brain: No evidence of acute territorial infarction, hemorrhage, hydrocephalus,extra-axial collection or mass lesion/mass effect. There is dilatation the ventricles and sulci consistent with age-related atrophy. Low-attenuation changes in the deep white matter consistent with small vessel ischemia. Vascular: No hyperdense vessel or unexpected calcification. Skull: The skull is intact. No fracture or focal lesion identified. Sinuses/Orbits: The visualized paranasal sinuses and mastoid air cells are clear. The orbits and globes intact. Other: None IMPRESSION: No acute intracranial abnormality. Findings consistent with age related atrophy and chronic small vessel ischemia Electronically Signed   By: Prudencio Pair M.D.   On: 11/06/2019 16:57   DG Ankle Left Port  Result Date: 11/07/2019 CLINICAL DATA:  Syncopal episode in the parking lot leading to fall. Left foot and ankle pain. EXAM: PORTABLE LEFT ANKLE - 2 VIEW COMPARISON:  None. FINDINGS: Oblique mildly comminuted distal fibular fracture just proximal to the ankle mortise. No additional fracture. No mortise widening. Anterolateral soft tissue edema but no definite joint effusion. Small plantar calcaneal spur. IMPRESSION: Oblique  mildly comminuted distal fibular fracture just proximal to the ankle mortise. Associated soft tissue edema. Electronically Signed   By: Keith Rake M.D.   On: 11/07/2019 04:22   DG Foot Complete Left  Result Date: 11/07/2019 CLINICAL DATA:  Syncopal episode in the parking lot leading to fall. Left foot and ankle pain. EXAM: LEFT FOOT -  COMPLETE 3+ VIEW COMPARISON:  None. FINDINGS: Distal fibular fracture assessed on concurrent ankle exam. Remote fracture of the fifth metatarsal. No additional acute fracture of the foot. Mild hallux valgus and degenerative change of the first metatarsal phalangeal joint. Small plantar calcaneal spur. Anterolateral soft tissue edema. IMPRESSION: 1. No acute fracture of the foot. Distal fibular fracture assessed on concurrent ankle exam. 2. Remote fifth metatarsal fracture. Electronically Signed   By: Keith Rake M.D.   On: 11/07/2019 04:23    EKG: 11/06/2019 with AS-VP at 73 bpm with QRS of 184 ms. (personally reviewed)  TELEMETRY: V pacing 60s (personally reviewed)  DEVICE HISTORY: St Jude dual chamber PPM implantation 06/2018  Assessment/Plan: 1.  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  Prior to this episode, his VT had been quiescent on BB therapy and EF recovery. Not on AAD. He has previously tolerated amiodarone well for PVC suppression (weaned off s/p ablation) Echo 04/13/2018 with LVEF 50-55%. Repeat pending.  With known CAD and CP on 12/14, Abdalrahman Clementson plan for LHC today (Discussed with Dr. Curt Bears). If no significant disease/reversible cause for his VT noted on LHC, Mirranda Monrroy need upgrade to ICD (+/- CRT with pacing induced LBBB)  2. Second degree AV block S/p St Jude dual chamber PPM as above Normal device function on interrogation.   3. CAD  Had mild chest pain 12/14, but none on day of syncope LHC 2009 with 80% diag of LAD, 30% in OM2  of LCx, LHC 11/2011 with stable CAD.   4. Paroxysmal atrial fibrillation On eliquis for CHA2DS2VASC of 5   Burden low overall (<1%)  5. Left fibular fracture Distal fibular fracture on xrays. IM managing.  For questions or updates, please contact [place] Please consult [email or web address] for contact info under Cardiology/STEMI.  Signed, Shirley Friar, PA-C  11/07/2019 9:27 AM    I have seen and examined this patient with Oda Kilts.  Agree with above, note added to reflect my findings.  On exam, RRR, no murmurs, lungs clear.  Patient presented to the hospital after an episode of syncope.  Pacemaker interrogation shows ventricular tachycardia for 30 seconds.  He does have coronary artery disease which she was stable as of last catheterization in 2013.  Due to his VT, and some mild chest discomfort, Emanuelle Bastos plan for left heart catheterization.  Pending left heart catheterization, he Geneieve Duell likely need device upgrade to an ICD.  He may also benefit from amiodarone as an outpatient.  Lauramae Kneisley M. Zameer Borman MD 11/07/2019 3:04 PM

## 2019-11-07 NOTE — Progress Notes (Signed)
  Echocardiogram 2D Echocardiogram has been performed.  Darlina Sicilian M 11/07/2019, 10:52 AM

## 2019-11-07 NOTE — Plan of Care (Signed)
78 year old male admitted this morning with history of CAD pacemaker A. fib on Eliquis CKD stage III nonischemic cardiomyopathy paroxysmal ventricular tachycardia admitted after a syncopal episode yesterday Tuesday. EP consulted by the ED physician and appreciate their input.  Plan for cardiac cath today.

## 2019-11-07 NOTE — Interval H&P Note (Signed)
Cath Lab Visit (complete for each Cath Lab visit)  Clinical Evaluation Leading to the Procedure:   ACS: Yes.    Non-ACS:  n/a   History and Physical Interval Note:  11/07/2019 3:19 PM  [name]  has presented today for surgery, with the diagnosis of vt - cad.  The various methods of treatment have been discussed with the patient and family. After consideration of risks, benefits and other options for treatment, the patient has consented to  Procedure(s): LEFT HEART CATH AND CORONARY ANGIOGRAPHY (N/A) as a surgical intervention.  The patient's history has been reviewed, patient examined, no change in status, stable for surgery.  I have reviewed the patient's chart and labs.  Questions were answered to the patient's satisfaction.     Kathlyn [place]

## 2019-11-07 NOTE — H&P (Signed)
History and Physical    [name] [id number] DOB: 1941-01-23 DOA: 11/06/2019  PCP: Lucianne Lei, MD   Patient coming from: Home   Chief Complaint: Syncope, left ankle pain   HPI: [name] is a 78 y.o. male with medical history significant for coronary artery disease, heart block with pacer, atrial fibrillation on Eliquis, chronic kidney disease stage III, nonischemic cardiomyopathy, and paroxysmal ventricular tachycardia, now presenting to the emergency department after syncopal episode.  Patient reports that he been in his usual state of health, was having an uneventful day, and was walking through a parking lot when he developed sudden loss of consciousness.  He reports having chest pain the day prior to this (11/05/2019), but did not experience any chest discomfort just prior to the episode.  He is on certain of what happened but regained awareness with pain at the back of his head and left ankle.  Patient denies any recent fevers, chills, cough, or shortness of breath.  Denies any change in vision or hearing or focal numbness or weakness.  In 2012, he had a EF as low as 2025%, wore a LifeVest, but then had improvement in EF to 35 to 40%.  Most recent echocardiogram in EMR from May 2019 featured EF 50 to 55%.  In 2016, patient had episodes of nonsustained ventricular tachycardia during the stress test.  ED Course: Upon arrival to the ED, patient is found to be afebrile, saturating well on room air, and with stable blood pressure.  EKG features a paced rhythm with PVCs.  Chest x-ray is negative for acute findings.  Radiographs of the left ankle demonstrate mildly comminuted distal fibula fracture.  Chemistry panel notable for creatinine 1.69, similar to priors.  Noncontrast head CT negative for acute intracranial abnormality.  CBC unremarkable.  Troponin elevated to 27, then further elevated to 75.  Urinalysis is nitrite positive.  Urine was sent for culture in the ED, empiric  Rocephin was started, COVID-19 screening test is in process, cardiology was consulted by the ED physician, and medical admission was recommended.  Review of Systems:  All other systems reviewed and apart from HPI, are negative.  Past Medical History:  Diagnosis Date  . CAD (coronary artery disease) 80% stenosis diag of the LAD, 30% in OM2 branch of LCX in 2009    a. Nonobstructive CAD by cath 11/2011 with the exception of the pre-existing diagonal branch #2 lesion.  . Chronic systolic CHF (congestive heart failure) ([place])   . CKD (chronic kidney disease) stage 3, GFR 30-59 ml/min   . Colon polyp, hyperplastic   . History of stress test 06/01/2012   Normal myocardial perfusion study. compared to the previous study there is no significant change. this is a low risk scan  . Hypertension   . Legally blind    "both eyes"  . Myocardial infarction ([place]) 11/22/11  . NICM (nonischemic cardiomyopathy) ([place])    a. Remote hx of dilated NICM with EF ranging 20-45%, including normal EF by echo (55-60%) in 2014.  Marland Kitchen Peripheral arterial disease ([place])    a. 06/2014: ABI right 0.99, left 1.2, LE dopplers revealing an occluded right posterior tibial. As symptoms were not felt r/t claudication, no further w/u at the time.  Marland Kitchen PVC's (premature ventricular contractions)   . 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 Requiring discontinuation of BB dose.  Marland Kitchen Spondylolisthesis   . Ventricular bigeminy   . Ventricular tachycardia (paroxysmal) ([place]) 04/11/2015    Past  Surgical History:  Procedure Laterality Date  . BACK SURGERY    . CARDIAC CATHETERIZATION  11/2011  . CARDIAC CATHETERIZATION  11/2011   didn't demonstrate high grade obstructive disease to account for his LV dysfunction.  Marland Kitchen CATARACT EXTRACTION, BILATERAL  1990's  . CYSTOSCOPY    . CYSTOSCOPY WITH URETHRAL DILATATION N/A 05/04/2013   Procedure: CYSTOSCOPY WITH URETHRAL DILATATION;  Surgeon: Eustace Moore, MD;  Location: [facility];  Service:  Neurosurgery;  Laterality: N/A;  with insertion of foley catheter  . EP study and ablation of VT  7/13   PVC focus mapped to the right coronary cusp of the aorta, limited ablation performed due to proximity of the focus to the right coronary artery  . EYE SURGERY    . LEFT HEART CATHETERIZATION WITH CORONARY ANGIOGRAM N/A 11/25/2011   Procedure: LEFT HEART CATHETERIZATION WITH CORONARY ANGIOGRAM;  Surgeon: Leonie Man, MD;  Location: [facility];  Service: Cardiovascular;  Laterality: N/A;  . PACEMAKER IMPLANT N/A 07/12/2018   Procedure: PACEMAKER IMPLANT;  Surgeon: Sanda Klein, MD;  Location: [facility] CV LAB;  Service: Cardiovascular;  Laterality: N/A;  . POSTERIOR FUSION [place]  . ROTATOR CUFF REPAIR  2000's   left  . V-TACH ABLATION N/A 06/06/2012   Procedure: V-TACH ABLATION;  Surgeon: Thompson Grayer, MD;  Location: [facility] CATH LAB;  Service: Cardiovascular;  Laterality: N/A;     reports that he quit smoking about 19 years ago. His smoking use included cigarettes. He quit after 50.00 years of use. He has never used smokeless tobacco. He reports that he does not drink alcohol or use drugs.  No Known Allergies  Family History  Problem Relation Age of Onset  . Asthma Mother   . Other Other        Unsure if any heart disease in his family.     Prior to Admission medications   Medication Sig Start Date End Date Taking? Authorizing Provider  albuterol (PROVENTIL HFA;VENTOLIN HFA) 108 (90 Base) MCG/ACT inhaler Inhale 1 puff into the lungs every 6 (six) hours as needed for wheezing or shortness of breath.    [provider]  amLODipine (NORVASC) 10 MG tablet TAKE 1 TABLET(10 MG) BY MOUTH DAILY 02/12/19   Croitoru, Dani Gobble, MD  apixaban (ELIQUIS) 2.5 MG TABS tablet Take 1 tablet (2.5 mg total) by mouth 2 (two) times daily. 04/30/19   Kroeger, Daleen Snook M., PA-C  BREO ELLIPTA 100-25 MCG/INH AEPB INL 1 PUFF PO QAM 07/07/18   [provider]  dorzolamide-timolol  (COSOPT) 22.3-6.8 MG/ML ophthalmic solution Place 1 drop into both eyes daily.  02/16/18   [provider]  FLUAD 0.5 ML SUSY ADMINISTER 0.5ML IN THE MUSCLE AS DIRECTED 08/17/18   [provider]  furosemide (LASIX) 20 MG tablet TAKE 1 TABLET(20 MG) BY MOUTH DAILY 09/05/18   Troy Sine, MD  isosorbide mononitrate (IMDUR) 30 MG 24 hr tablet TAKE 1 TABLET(30 MG) BY MOUTH DAILY 06/21/19   Troy Sine, MD  isosorbide mononitrate (IMDUR) 30 MG 24 hr tablet TAKE 1 TABLET(30 MG) BY MOUTH DAILY 06/21/19   Troy Sine, MD  LUMIGAN 0.01 % SOLN Place 1 drop into both eyes at bedtime.  12/06/14   [provider]  metoprolol succinate (TOPROL-XL) 25 MG 24 hr tablet Take 1.5 tablets (37.5 mg total) by mouth daily. 04/09/19   Troy Sine, MD  nitroGLYCERIN (NITROSTAT) 0.4 MG SL tablet Place 1 tablet (0.4 mg total) under  the tongue every 5 (five) minutes as needed for chest pain. 01/16/18   Troy Sine, MD  pravastatin (PRAVACHOL) 40 MG tablet  10/02/19   [provider]  RESTASIS 0.05 % ophthalmic emulsion Place 1 drop into both eyes daily. 12/30/14   [provider]  spironolactone (ALDACTONE) 25 MG tablet Take 0.5 tablets (12.5 mg total) by mouth 2 (two) times daily. 10/24/19   Troy Sine, MD    Physical Exam: Vitals:   11/07/19 0415 11/07/19 0445 11/07/19 0500 11/07/19 0515  BP: 134/76 (!) 148/81 (!) 141/72 (!) 154/84  Pulse: (!) 59   68  Resp: 14 14 16 15   Temp:      TempSrc:      SpO2: 97%   97%  Weight:      Height:        Constitutional: NAD, calm  Eyes: PERTLA, lids and conjunctivae normal ENMT: Mucous membranes are moist. Posterior pharynx clear of any exudate or lesions.   Neck: normal, supple, no masses, no thyromegaly Respiratory:  no wheezing, no crackles. Normal respiratory effort. No accessory muscle use.  Cardiovascular: S1 & S2 heard, regular rate and rhythm. No extremity edema.   Abdomen: No distension, no tenderness,  soft. Bowel sounds active.  Musculoskeletal: no clubbing / cyanosis. No joint deformity upper and lower extremities.    Skin: no significant rashes, lesions, ulcers. Warm, dry, well-perfused. Neurologic: No facial asymmetry. Sensation intact. Moving all extremities.  Psychiatric: Alert and oriented to person, place, and situation. Pleasant, cooperative.      Labs on Admission: I have personally reviewed following labs and imaging studies  CBC: Recent Labs  Lab 11/06/19 1552  WBC 5.7  HGB 15.1  HCT 44.0  MCV 90.5  PLT 0000000   Basic Metabolic Panel: Recent Labs  Lab 11/06/19 1552  NA 141  K 4.4  CL 107  CO2 25  GLUCOSE 118*  BUN 15  CREATININE 1.69*  CALCIUM 9.5   GFR: Estimated Creatinine Clearance: 33.7 mL/min (A) (by C-G formula based on SCr of 1.69 mg/dL (H)). Liver Function Tests: No results for input(s): AST, ALT, ALKPHOS, BILITOT, PROT, ALBUMIN in the last 168 hours. No results for input(s): LIPASE, AMYLASE in the last 168 hours. No results for input(s): AMMONIA in the last 168 hours. Coagulation Profile: No results for input(s): INR, PROTIME in the last 168 hours. Cardiac Enzymes: No results for input(s): CKTOTAL, CKMB, CKMBINDEX, TROPONINI in the last 168 hours. BNP (last 3 results) No results for input(s): PROBNP in the last 8760 hours. HbA1C: No results for input(s): HGBA1C in the last 72 hours. CBG: Recent Labs  Lab 11/06/19 1551  GLUCAP 96   Lipid Profile: No results for input(s): CHOL, HDL, LDLCALC, TRIG, CHOLHDL, LDLDIRECT in the last 72 hours. Thyroid Function Tests: No results for input(s): TSH, T4TOTAL, FREET4, T3FREE, THYROIDAB in the last 72 hours. Anemia Panel: No results for input(s): VITAMINB12, FOLATE, FERRITIN, TIBC, IRON, RETICCTPCT in the last 72 hours. Urine analysis:    Component Value Date/Time   COLORURINE YELLOW 11/06/2019 1701   APPEARANCEUR HAZY (A) 11/06/2019 1701   LABSPEC 1.015 11/06/2019 1701   PHURINE 6.0 11/06/2019  1701   GLUCOSEU NEGATIVE 11/06/2019 1701   HGBUR NEGATIVE 11/06/2019 1701   BILIRUBINUR NEGATIVE 11/06/2019 1701   KETONESUR NEGATIVE 11/06/2019 1701   PROTEINUR 100 (A) 11/06/2019 1701   UROBILINOGEN 0.2 11/22/2011 0247   NITRITE POSITIVE (A) 11/06/2019 1701   LEUKOCYTESUR SMALL (A) 11/06/2019 1701   Sepsis Labs: @  LABRCNTIP(procalcitonin:4,lacticidven:4) )No results found for this or any previous visit (from the past 240 hour(s)).   Radiological Exams on Admission: DG Chest 2 View  Result Date: 11/06/2019 CLINICAL DATA:  Onset chest pain yesterday. EXAM: CHEST - 2 VIEW COMPARISON:  PA and lateral chest 07/13/2018 FINDINGS: Lungs appear emphysematous but are clear. Heart size is upper normal. No pneumothorax or pleural fluid. No acute or focal bony abnormality. Pacing device and spinal fusion hardware unchanged. IMPRESSION: No acute disease. Lungs appear emphysematous. Electronically Signed   By: Inge Rise M.D.   On: 11/06/2019 16:34   CT HEAD WO CONTRAST  Result Date: 11/06/2019 CLINICAL DATA:  Headache and dizziness EXAM: CT HEAD WITHOUT CONTRAST TECHNIQUE: Contiguous axial images were obtained from the base of the skull through the vertex without intravenous contrast. COMPARISON:  November 17, 2016 FINDINGS: Brain: No evidence of acute territorial infarction, hemorrhage, hydrocephalus,extra-axial collection or mass lesion/mass effect. There is dilatation the ventricles and sulci consistent with age-related atrophy. Low-attenuation changes in the deep white matter consistent with small vessel ischemia. Vascular: No hyperdense vessel or unexpected calcification. Skull: The skull is intact. No fracture or focal lesion identified. Sinuses/Orbits: The visualized paranasal sinuses and mastoid air cells are clear. The orbits and globes intact. Other: None IMPRESSION: No acute intracranial abnormality. Findings consistent with age related atrophy and chronic small vessel ischemia  Electronically Signed   By: Prudencio Pair M.D.   On: 11/06/2019 16:57   DG Ankle Left Port  Result Date: 11/07/2019 CLINICAL DATA:  Syncopal episode in the parking lot leading to fall. Left foot and ankle pain. EXAM: PORTABLE LEFT ANKLE - 2 VIEW COMPARISON:  None. FINDINGS: Oblique mildly comminuted distal fibular fracture just proximal to the ankle mortise. No additional fracture. No mortise widening. Anterolateral soft tissue edema but no definite joint effusion. Small plantar calcaneal spur. IMPRESSION: Oblique mildly comminuted distal fibular fracture just proximal to the ankle mortise. Associated soft tissue edema. Electronically Signed   By: Keith Rake M.D.   On: 11/07/2019 04:22   DG Foot Complete Left  Result Date: 11/07/2019 CLINICAL DATA:  Syncopal episode in the parking lot leading to fall. Left foot and ankle pain. EXAM: LEFT FOOT - COMPLETE 3+ VIEW COMPARISON:  None. FINDINGS: Distal fibular fracture assessed on concurrent ankle exam. Remote fracture of the fifth metatarsal. No additional acute fracture of the foot. Mild hallux valgus and degenerative change of the first metatarsal phalangeal joint. Small plantar calcaneal spur. Anterolateral soft tissue edema. IMPRESSION: 1. No acute fracture of the foot. Distal fibular fracture assessed on concurrent ankle exam. 2. Remote fifth metatarsal fracture. Electronically Signed   By: Keith Rake M.D.   On: 11/07/2019 04:23    EKG: Independently reviewed. Paced rhythm, PVC's.  Assessment/Plan   1. Syncope; ventricular tachycardia  - Presents after a syncopal episode and pacer interrogation report notes an 80-beat run of VT at time of episode  - Cardiology fellow consulted by ED physician and plans to have electrophysiologist consult on patient  - Continue cardiac monitoring, update echocardiogram, follow-up cardiology recommendations   2. CAD; elevated troponin  - Patient reports having chest pain on 11/05/19 but none since  -  HS troponin was 27 in ED, then 75  - Troponin elevation likely secondary to the episode of VT, will continue cardiac monitoring, trend troponin   3. UTI  - UA in ED concerning for infection, sample was sent for culture, and he was started on Rocephin    - Continue  Rocephin, follow culture    4. Paroxysmal atrial fibrillation  - CHADS-VASc is 71 (age x2, CAD, CHF, HTN) - He has been on Eliquis and Toprol but unable to confirm current medications in ED and pharmacy med-rec pending    5. CKD stage III  - SCr is 1.69 on admission, similar to priors  - Renally-dose medications, monitor   6. Distal left fibula fracture  - There is mildly comminuted distal left fibula fracture on plain films in ED  - He is neurovascularly intact and placed in immobilizing boot in ED  - Continue pain-control     DVT prophylaxis: Eliquis pta  Code Status: Full  Family Communication: Discussed with patient  Consults called: Cardiology consulted by ED physician  Admission status: Inpatient    Vianne Bulls, MD [facility] Pager [phone]  If 7PM-7AM, please contact night-coverage [email or web address] Password [email or web address]  11/07/2019, 5:50 AM

## 2019-11-07 NOTE — Plan of Care (Signed)
  Problem: Pain Managment: Goal: General experience of comfort will improve Outcome: Progressing   Problem: Safety: Goal: Ability to remain free from injury will improve Outcome: Progressing   

## 2019-11-07 NOTE — Plan of Care (Signed)
New admit with heart cath no complications or intervention.

## 2019-11-07 NOTE — ED Provider Notes (Signed)
[name] Provider Note   CSN: [id number] Arrival date & time: 11/06/19  1505     History Chief Complaint  Patient presents with  . Loss of Consciousness  . Dizziness    [name] is a 78 y.o. male.  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  He states that he was out running errands yesterday, when he passed out.  He reports that yesterday morning he awoke with chest pain.  The chest pain was fairly consistent all day yesterday.  He states that he did have some shortness of breath which was associated with the chest pain.  Both the chest pain and shortness of breath have resolved now.  When he had the syncopal event, he did hit his head.  CT was ordered in triage, and was negative for acute intracranial injury.  He also complains of left ankle pain.  He states that he has been unable to walk on his left.  He denies any other associated symptoms.  Denies any fever, chills, cough, abdominal pain, or dysuria.  He states that he urinates frequently, but attributes this to his fluid pill.  The history is provided by the patient. No language interpreter was used.       Past Medical History:  Diagnosis Date  . CAD (coronary artery disease) 80% stenosis diag of the LAD, 30% in OM2 branch of LCX in 2009    a. Nonobstructive CAD by cath 11/2011 with the exception of the pre-existing diagonal branch #2 lesion.  . Chronic systolic CHF (congestive heart failure) ([place])   . CKD (chronic kidney disease) stage 3, GFR 30-59 ml/min   . Colon polyp, hyperplastic   . History of stress test 06/01/2012   Normal myocardial perfusion study. compared to the previous study there is no significant change. this is a low risk scan  . Hypertension   . Legally  blind    "both eyes"  . Myocardial infarction ([place]) 11/22/11  . NICM (nonischemic cardiomyopathy) ([place])    a. Remote hx of dilated NICM with EF ranging 20-45%, including normal EF by echo (55-60%) in 2014.  Marland Kitchen Peripheral arterial disease ([place])    a. 06/2014: ABI right 0.99, left 1.2, LE dopplers revealing an occluded right posterior tibial. As symptoms were not felt r/t claudication, no further w/u at the time.  Marland Kitchen PVC's (premature ventricular contractions)   . 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 Requiring discontinuation of BB dose.  Marland Kitchen Spondylolisthesis   . Ventricular bigeminy   . Ventricular tachycardia (paroxysmal) ([place]) 04/11/2015    Patient Active Problem List   Diagnosis Date Noted  . Pain due to onychomycosis of toenails of both feet 05/16/2019  . Coagulation disorder ([place]) 05/16/2019  . Paroxysmal atrial fibrillation ([place]) 08/02/2018  . NSVT (nonsustained ventricular tachycardia) ([place]) 08/02/2018  . Symptomatic bradycardia 07/12/2018  . Pacemaker 07/12/2018  . Tachycardia-bradycardia syndrome ([place])   . Unsteady gait 03/12/2016  . Nausea vomiting and diarrhea 03/11/2016  . UTI (lower urinary tract infection) 03/11/2016  . Gastroenteritis 03/11/2016  . Obstipation 03/11/2016  . Abdominal pain   . Constipation   . Lactic acidosis   . Leg pain   . Essential hypertension 03/04/2016  . Ventricular tachycardia (paroxysmal) ([place]) 04/11/2015  . Chest pain with moderate risk for  cardiac etiology 04/09/2015  . CAP (community acquired pneumonia) 02/18/2015  . Chest pain at rest 02/18/2015  . Peripheral arterial disease ([place]) 08/15/2014  . Hyperlipidemia with target LDL less than 70 01/01/2014  . GERD (gastroesophageal reflux disease) 01/01/2014  . First degree AV block 07/31/2012  . Unstable angina, negative MI 06/05/2012  . Acute on chronic systolic CHF (congestive heart failure) ([place]) 06/05/2012  . Second degree AV block, Mobitz type I 05/27/2012  . CKD (chronic kidney  disease) stage 3, GFR 30-59 ml/min 05/27/2012  . 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 Chronic systolic heart failure ([place]) 11/23/2011    Class: Diagnosis of  . Coronary artery disease involving native coronary artery of native heart without angina pectoris 11/22/2011  . Premature ventricular contractions 11/22/2011  . Esophageal reflux 07/28/2011    Past Surgical History:  Procedure Laterality Date  . BACK SURGERY    . CARDIAC CATHETERIZATION  11/2011  . CARDIAC CATHETERIZATION  11/2011   didn't demonstrate high grade obstructive disease to account for his LV dysfunction.  Marland Kitchen CATARACT EXTRACTION, BILATERAL  1990's  . CYSTOSCOPY    . CYSTOSCOPY WITH URETHRAL DILATATION N/A 05/04/2013   Procedure: CYSTOSCOPY WITH URETHRAL DILATATION;  Surgeon: Eustace Moore, MD;  Location: [facility];  Service: Neurosurgery;  Laterality: N/A;  with insertion of foley catheter  . EP study and ablation of VT  7/13   PVC focus mapped to the right coronary cusp of the aorta, limited ablation performed due to proximity of the focus to the right coronary artery  . EYE SURGERY    . LEFT HEART CATHETERIZATION WITH CORONARY ANGIOGRAM N/A 11/25/2011   Procedure: LEFT HEART CATHETERIZATION WITH CORONARY ANGIOGRAM;  Surgeon: Leonie Man, MD;  Location: [facility] CATH LAB;  Service: Cardiovascular;  Laterality: N/A;  . PACEMAKER IMPLANT N/A 07/12/2018   Procedure: PACEMAKER IMPLANT;  Surgeon: Sanda Klein, MD;  Location: [facility] CV LAB;  Service: Cardiovascular;  Laterality: N/A;  . POSTERIOR FUSION [place]  . ROTATOR CUFF REPAIR  2000's   left  . V-TACH ABLATION N/A 06/06/2012   Procedure: V-TACH ABLATION;  Surgeon: Thompson Grayer, MD;  Location: [facility] CATH LAB;  Service: Cardiovascular;  Laterality: N/A;       Family History  Problem Relation Age of Onset  . Asthma Mother   . Other Other        Unsure if any heart  disease in his family.    Social History   Tobacco Use  . Smoking status: Former Smoker    Years: 50.00    Types: Cigarettes    Quit date: 11/23/1999    Years since quitting: 19.9  . Smokeless tobacco: Never Used  Substance Use Topics  . 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 Drug use: No    Home Medications Prior to Admission medications   Medication Sig Start Date End Date Taking? Authorizing Provider  albuterol (PROVENTIL HFA;VENTOLIN HFA) 108 (90 Base) MCG/ACT inhaler Inhale 1 puff into the lungs every 6 (six) hours as needed for wheezing or shortness of breath.    [provider]  amLODipine (NORVASC) 10 MG tablet TAKE 1 TABLET(10 MG) BY MOUTH DAILY 02/12/19   Croitoru, Dani Gobble, MD  apixaban (ELIQUIS) 2.5 MG TABS tablet Take 1 tablet (2.5 mg total) by mouth 2 (two) times daily. 04/30/19  Kroeger, Daleen Snook M., PA-C  BREO ELLIPTA 100-25 MCG/INH AEPB INL 1 PUFF PO QAM 07/07/18   [provider]  dorzolamide-timolol (COSOPT) 22.3-6.8 MG/ML ophthalmic solution Place 1 drop into both eyes daily.  02/16/18   [provider]  FLUAD 0.5 ML SUSY ADMINISTER 0.5ML IN THE MUSCLE AS DIRECTED 08/17/18   [provider]  furosemide (LASIX) 20 MG tablet TAKE 1 TABLET(20 MG) BY MOUTH DAILY 09/05/18   Troy Sine, MD  isosorbide mononitrate (IMDUR) 30 MG 24 hr tablet TAKE 1 TABLET(30 MG) BY MOUTH DAILY 06/21/19   Troy Sine, MD  isosorbide mononitrate (IMDUR) 30 MG 24 hr tablet TAKE 1 TABLET(30 MG) BY MOUTH DAILY 06/21/19   Troy Sine, MD  LUMIGAN 0.01 % SOLN Place 1 drop into both eyes at bedtime.  12/06/14   [provider]  metoprolol succinate (TOPROL-XL) 25 MG 24 hr tablet Take 1.5 tablets (37.5 mg total) by mouth daily. 04/09/19   Troy Sine, MD  nitroGLYCERIN (NITROSTAT) 0.4 MG SL tablet Place 1 tablet (0.4 mg total) under the tongue every 5 (five) minutes as needed for chest pain.  01/16/18   Troy Sine, MD  pravastatin (PRAVACHOL) 40 MG tablet  10/02/19   [provider]  RESTASIS 0.05 % ophthalmic emulsion Place 1 drop into both eyes daily. 12/30/14   [provider]  spironolactone (ALDACTONE) 25 MG tablet Take 0.5 tablets (12.5 mg total) by mouth 2 (two) times daily. 10/24/19   Troy Sine, MD    Allergies    Patient has no known allergies.  Review of Systems   Review of Systems  All other systems reviewed and are negative.   Physical Exam Updated Vital Signs BP 129/84 (BP Location: Left Arm)   Pulse 65   Temp 98.4 F (36.9 C) (Oral)   Resp 16   Ht 5\' 8"  (1.727 m)   Wt 77.6 kg   SpO2 96%   BMI 26.00 kg/m   Physical Exam Vitals and nursing note reviewed.  Constitutional:      Appearance: He is well-developed.  HENT:     Head: Normocephalic and atraumatic.  Eyes:     Conjunctiva/sclera: Conjunctivae normal.  Cardiovascular:     Rate and Rhythm: Normal rate and regular rhythm.     Heart sounds: No murmur.     Comments: Intact distal pulses Pulmonary:     Effort: Pulmonary effort is normal. No respiratory distress.     Breath sounds: Normal breath sounds.  Abdominal:     Palpations: Abdomen is soft.     Tenderness: There is no abdominal tenderness.  Musculoskeletal:        General: Normal range of motion.     Cervical back: Neck supple.     Comments: Moderate swelling about the left ankle, TTP  Skin:    General: Skin is warm and dry.  Neurological:     Mental Status: He is alert and oriented to person, place, and time.  Psychiatric:        Mood and Affect: Mood normal.        Behavior: Behavior normal.     ED Results / Procedures / Treatments   Labs (all labs ordered are listed, but only abnormal results are displayed) Labs Reviewed  BASIC METABOLIC PANEL - Abnormal; Notable for the following components:      Result Value   Glucose, Bld 118 (*)    Creatinine, Ser 1.69 (*)    GFR calc  non Af Amer 38 (*)      GFR calc Af Amer 43 (*)    All other components within normal limits  URINALYSIS, ROUTINE W REFLEX MICROSCOPIC - Abnormal; Notable for the following components:   APPearance HAZY (*)    Protein, ur 100 (*)    Nitrite POSITIVE (*)    Leukocytes,Ua SMALL (*)    Bacteria, UA MANY (*)    All other components within normal limits  TROPONIN I (HIGH SENSITIVITY) - Abnormal; Notable for the following components:   Troponin I (High Sensitivity) 27 (*)    All other components within normal limits  TROPONIN I (HIGH SENSITIVITY) - Abnormal; Notable for the following components:   Troponin I (High Sensitivity) 75 (*)    All other components within normal limits  SARS CORONAVIRUS 2 (TAT 6-24 HRS)  URINE CULTURE  CBC  CBG MONITORING, ED    EKG EKG Interpretation  Date/Time:  Tuesday November 06 2019 15:51:52 EST Ventricular Rate:  73 PR Interval:  196 QRS Duration: 184 QT Interval:  448 QTC Calculation: 493 R Axis:   -135 Text Interpretation: Atrial-sensed ventricular-paced rhythm with occasional Premature ventricular complexes Abnormal ECG Confirmed by Ripley Fraise [phone]) on 11/07/2019 3:44:13 AM   Radiology DG Chest 2 View  Result Date: 11/06/2019 CLINICAL DATA:  Onset chest pain yesterday. EXAM: CHEST - 2 VIEW COMPARISON:  PA and lateral chest 07/13/2018 FINDINGS: Lungs appear emphysematous but are clear. Heart size is upper normal. No pneumothorax or pleural fluid. No acute or focal bony abnormality. Pacing device and spinal fusion hardware unchanged. IMPRESSION: No acute disease. Lungs appear emphysematous. Electronically Signed   By: Inge Rise M.D.   On: 11/06/2019 16:34   CT HEAD WO CONTRAST  Result Date: 11/06/2019 CLINICAL DATA:  Headache and dizziness EXAM: CT HEAD WITHOUT CONTRAST TECHNIQUE: Contiguous axial images were obtained from the base of the skull through the vertex without intravenous contrast. COMPARISON:  November 17, 2016 FINDINGS: Brain: No evidence  of acute territorial infarction, hemorrhage, hydrocephalus,extra-axial collection or mass lesion/mass effect. There is dilatation the ventricles and sulci consistent with age-related atrophy. Low-attenuation changes in the deep white matter consistent with small vessel ischemia. Vascular: No hyperdense vessel or unexpected calcification. Skull: The skull is intact. No fracture or focal lesion identified. Sinuses/Orbits: The visualized paranasal sinuses and mastoid air cells are clear. The orbits and globes intact. Other: None IMPRESSION: No acute intracranial abnormality. Findings consistent with age related atrophy and chronic small vessel ischemia Electronically Signed   By: Prudencio Pair M.D.   On: 11/06/2019 16:57    Procedures Procedures (including critical care time)  Medications Ordered in ED Medications  cefTRIAXone (ROCEPHIN) 1 g in sodium chloride 0.9 % 100 mL IVPB (has no administration in time range)  aspirin chewable tablet 324 mg (has no administration in time range)    ED Course  I have reviewed the triage vital signs and the nursing notes.  Pertinent labs & imaging results that were available during my care of the patient were reviewed by me and considered in my medical decision making (see chart for details).    MDM Rules/Calculators/A&P                      Patient with syncopal event yesterday.  He passed out while running errands.  He has had chest pain with associated shortness of breath that started yesterday.  He states that his chest pain and shortness of breath have resolved completely.  He is anticoagulated on Eliquis.  He is not hypoxic.  I doubt PE.  He does have significant heart history.  His troponin is trending up, first was 27, second was 75.  No acute ischemic changes on EKG.  Patient will need observation in the hospital due to increasing troponin.  Additionally, he will need an x-ray of his left ankle which is swollen after having fallen yesterday.  CT scan of  his head shows no intracranial injury.  Incidentally, his urinalysis is consistent with UTI.  Will send urine for culture.  Will treat with Rocephin empirically.  We will see if we can interrogate the pacemaker prior to admission.  I received a phone call from the [facility] rep, who informs me that the patient had an 80 beat run of ventricular tachycardia at approximately 2 PM yesterday.  Max heart rate was 203.  The episode lasted approximately 20 seconds.  This seems to correlate with when he had his syncopal episode.  I discussed case with Dr. Myna Hidalgo from [facility], who is appreciated for admitting the patient.  Requested I consult cardiology.  Dr. Christy Gentles spoke with the on-call cardiologist Dr. Emilio Aspen, who will have the patient seen by the EP team in the morning.  Final Clinical Impression(s) / ED Diagnoses Final diagnoses:  Fall  Cardiac syncope  Urinary tract infection without hematuria, site unspecified  Closed fracture of distal end of left fibula, unspecified fracture morphology, initial encounter    Rx / DC Orders ED Discharge Orders    None       Mayank, [place], PA-C 11/07/19 [id number]    Ripley Fraise, MD 11/07/19 [phone]

## 2019-11-08 ENCOUNTER — Inpatient Hospital Stay (HOSPITAL_COMMUNITY)
Admission: EM | Disposition: A | Discharge status: Home Health | Payer: Self-pay | Source: Home / Self Care | Attending: Internal Medicine

## 2019-11-08 ENCOUNTER — Ambulatory Visit (HOSPITAL_COMMUNITY): Admission: RE | Admit: 2019-11-08 | Payer: Medicare Other | Source: Home / Self Care | Admitting: Internal Medicine

## 2019-11-08 DIAGNOSIS — I5022 Chronic systolic (congestive) heart failure: Secondary | ICD-10-CM

## 2019-11-08 DIAGNOSIS — Z95810 Presence of automatic (implantable) cardiac defibrillator: Secondary | ICD-10-CM

## 2019-11-08 HISTORY — PX: BIV UPGRADE: EP1202

## 2019-11-08 LAB — COMPREHENSIVE METABOLIC PANEL
ALT: 19 U/L (ref 0–44)
AST: 23 U/L (ref 15–41)
Albumin: 3.3 g/dL — ABNORMAL LOW (ref 3.5–5.0)
Alkaline Phosphatase: 54 U/L (ref 38–126)
Anion gap: 10 (ref 5–15)
BUN: 19 mg/dL (ref 8–23)
CO2: 24 mmol/L (ref 22–32)
Calcium: 9.1 mg/dL (ref 8.9–10.3)
Chloride: 108 mmol/L (ref 98–111)
Creatinine, Ser: 1.7 mg/dL — ABNORMAL HIGH (ref 0.61–1.24)
GFR calc Af Amer: 43 mL/min — ABNORMAL LOW (ref >60)
GFR calc non Af Amer: 37 mL/min — ABNORMAL LOW (ref >60)
Glucose, Bld: 100 mg/dL — ABNORMAL HIGH (ref 70–99)
Potassium: 4.1 mmol/L (ref 3.5–5.1)
Sodium: 142 mmol/L (ref 135–145)
Total Bilirubin: 1.3 mg/dL — ABNORMAL HIGH (ref 0.3–1.2)
Total Protein: 6.9 g/dL (ref 6.5–8.1)

## 2019-11-08 LAB — CBC WITH DIFFERENTIAL/PLATELET
Abs Immature Granulocytes: 0.02 10*3/uL (ref 0.00–0.07)
Basophils Absolute: 0 10*3/uL (ref 0.0–0.1)
Basophils Relative: 0 %
Eosinophils Absolute: 0.2 10*3/uL (ref 0.0–0.5)
Eosinophils Relative: 3 %
HCT: 43.9 % (ref 39.0–52.0)
Hemoglobin: 14.7 g/dL (ref 13.0–17.0)
Immature Granulocytes: 0 %
Lymphocytes Relative: 37 %
Lymphs Abs: 2.2 10*3/uL (ref 0.7–4.0)
MCH: 30.6 pg (ref 26.0–34.0)
MCHC: 33.5 g/dL (ref 30.0–36.0)
MCV: 91.3 fL (ref 80.0–100.0)
Monocytes Absolute: 1 10*3/uL (ref 0.1–1.0)
Monocytes Relative: 17 %
Neutro Abs: 2.5 10*3/uL (ref 1.7–7.7)
Neutrophils Relative %: 43 %
Platelets: 137 10*3/uL — ABNORMAL LOW (ref 150–400)
RBC: 4.81 MIL/uL (ref 4.22–5.81)
RDW: 12.4 % (ref 11.5–15.5)
WBC: 5.9 10*3/uL (ref 4.0–10.5)
nRBC: 0 % (ref 0.0–0.2)

## 2019-11-08 LAB — URINE CULTURE

## 2019-11-08 LAB — SURGICAL PCR SCREEN
MRSA, PCR: NEGATIVE
Staphylococcus aureus: NEGATIVE

## 2019-11-08 LAB — GLUCOSE, CAPILLARY: Glucose-Capillary: 92 mg/dL (ref 70–99)

## 2019-11-08 SURGERY — BIV UPGRADE

## 2019-11-08 MED ORDER — IOHEXOL 350 MG/ML SOLN
INTRAVENOUS | Status: DC | PRN
  Administered 2019-11-08 (×2): 10 mL

## 2019-11-08 MED ORDER — CHLORHEXIDINE GLUCONATE 4 % EX LIQD
60.0000 mL | Freq: Once | CUTANEOUS | Status: DC
  Filled 2019-11-08: qty 60

## 2019-11-08 MED ORDER — CHLORHEXIDINE GLUCONATE 4 % EX LIQD
60.0000 mL | Freq: Once | CUTANEOUS | Status: DC
  Administered 2019-11-08: 4 via TOPICAL

## 2019-11-08 MED ORDER — MIDAZOLAM HCL 5 MG/5ML IJ SOLN
INTRAMUSCULAR | Status: DC | PRN
  Administered 2019-11-08 (×4): 1 mg via INTRAVENOUS
  Administered 2019-11-08: 2 mg via INTRAVENOUS
  Administered 2019-11-08: 1 mg via INTRAVENOUS

## 2019-11-08 MED ORDER — FENTANYL CITRATE (PF) 100 MCG/2ML IJ SOLN
INTRAMUSCULAR | Status: DC | PRN
  Administered 2019-11-08 (×2): 12.5 ug via INTRAVENOUS
  Administered 2019-11-08: 25 ug via INTRAVENOUS
  Administered 2019-11-08: 12.5 ug via INTRAVENOUS
  Administered 2019-11-08: 25 ug via INTRAVENOUS

## 2019-11-08 MED ORDER — ONDANSETRON HCL 4 MG/2ML IJ SOLN: 4.0000 mg | Freq: Four times a day (QID) | INTRAMUSCULAR | Status: DC | PRN

## 2019-11-08 MED ORDER — CEFAZOLIN SODIUM-DEXTROSE 1-4 GM/50ML-% IV SOLN
1.0000 g | Freq: Four times a day (QID) | INTRAVENOUS | Status: AC
  Administered 2019-11-09: 1 g via INTRAVENOUS
  Filled 2019-11-08 (×2): qty 50

## 2019-11-08 MED ORDER — SODIUM CHLORIDE 0.9 % IV SOLN
80.0000 mg | INTRAVENOUS | Status: AC
  Administered 2019-11-08: 80 mg

## 2019-11-08 MED ORDER — SODIUM CHLORIDE 0.9 % IV SOLN: INTRAVENOUS | Status: DC

## 2019-11-08 MED ORDER — CEFAZOLIN SODIUM-DEXTROSE 2-4 GM/100ML-% IV SOLN
2.0000 g | INTRAVENOUS | Status: AC
  Administered 2019-11-08: 2 g via INTRAVENOUS

## 2019-11-08 MED ORDER — HEPARIN (PORCINE) IN NACL 1000-0.9 UT/500ML-% IV SOLN
INTRAVENOUS | Status: DC | PRN
  Administered 2019-11-08: 500 mL

## 2019-11-08 MED ORDER — ACETAMINOPHEN 325 MG PO TABS: 325.0000 mg | ORAL_TABLET | ORAL | Status: DC | PRN

## 2019-11-08 MED ORDER — LIDOCAINE HCL (PF) 1 % IJ SOLN
INTRAMUSCULAR | Status: DC | PRN
  Administered 2019-11-08: 60 mL

## 2019-11-08 SURGICAL SUPPLY — 16 items
CABLE SURGICAL S-101-97-12 (CABLE) ×3 IMPLANT
CATH CPS DIRECT 135 DS2C020 (CATHETERS) ×4 IMPLANT
CATH HEX JOS 2-5-2 65CM 6F REP (CATHETERS) ×2 IMPLANT
COVER DOME SNAP 22 D (MISCELLANEOUS) ×2 IMPLANT
CPS IMPLANT KIT 410190 (MISCELLANEOUS) ×2 IMPLANT
GUIDEWIRE ANGLED .035X150CM (WIRE) ×4 IMPLANT
ICD GALLANT HFCRTD CDHFA500Q (ICD Generator) ×2 IMPLANT
KIT ESSENTIALS PG (KITS) ×2 IMPLANT
LEAD DURATA 7122Q-58CM (Lead) ×2 IMPLANT
LEAD QUARTET 1458QL-86 (Lead) IMPLANT
PAD PRO RADIOLUCENT 2001M-C (PAD) ×3 IMPLANT
QUARTET 1458QL-86 (Lead) ×3 IMPLANT
SHEATH 7FR PRELUDE SNAP 13 (SHEATH) ×2 IMPLANT
TRAY PACEMAKER INSERTION (PACKS) ×3 IMPLANT
WIRE ACUITY WHISPER EDS 4648 (WIRE) ×2 IMPLANT
WIRE MAILMAN 182CM (WIRE) ×2 IMPLANT

## 2019-11-08 NOTE — Discharge Instructions (Signed)
After Your ICD (Implantable Cardiac Defibrillator)   . You have a St. Jude ICD . DO NOT RESUME YOUR BLOOD THINNER (ELIQUIS) until Sunday, Dec 20th, starting with the EVENING dose.   . Do not lift your arm above shoulder height for 1 week after your procedure. After 7 days, you may progress as below.     Thursday November 15, 2019  Friday November 16, 2019 Saturday November 17, 2019 Sunday November 18, 2019   . Do not lift, push, pull, or carry anything over 10 pounds with the affected arm until 6 weeks (Thursday December 20, 2019 ) after your procedure.   . Do not drive until you have been seen for your wound check, or as long as instructed by your healthcare provider.   . Monitor your defibrillator site for redness, swelling, and drainage. Call the device clinic at [phone] if you experience these symptoms or fever/chills.  . If your incision is sealed with Steri-strips or staples, you may shower 7 days after your procedure. Do not remove the steri-strips or let the shower hit directly on your site. You may wash around your site with soap and water. If your incision is closed with Dermabond/Surgical glue. You may shower 1 day after your pacemaker implant and wash around the site with soap and water. Avoid lotions, ointments, or perfumes over your incision until it is well-healed.  . You may use a hot tub or a pool AFTER your wound check appointment if the incision is completely closed.  . Your ICD  may be MRI compatible. This will be discussed at your next office visit/wound check.   . Your ICD is designed to protect you from life threatening heart rhythms. Because of this, you may receive a shock.   o 1 shock with no symptoms:  Call the office during business hours. o 1 shock with symptoms (chest pain, chest pressure, dizziness, lightheadedness, shortness of breath, overall feeling unwell):  Call 911. o If you experience 2 or more shocks in 24 hours:  Call 911. o If you receive a  shock, you should not drive for 6 months per the  DMV IF you receive appropriate therapy from your ICD.   . ICD Alerts:  Some alerts are vibratory and others beep. These are NOT emergencies. Please call our office to let us know. If this occurs at night or on weekends, it can wait until the next business day. Send a remote transmission.  . If your device is capable of reading fluid status (for heart failure), you will be offered monthly monitoring to review this with you.   . Remote monitoring is used to monitor your ICD from home. This monitoring is scheduled every 91 days by our office. It allows [place] to keep an eye on the functioning of your device to ensure it is working properly. You will routinely see your Electrophysiologist annually (more often if necessary).    Cardioverter Defibrillator Implantation, Care After This sheet gives you information about how to care for yourself after your procedure. Your health care provider may also give you more specific instructions. If you have problems or questions, contact your health care provider. What can I expect after the procedure? After the procedure, it is common to have:  Some pain. It may last a few days.  A slight bump over the skin where the device was placed. Sometimes, it is possible to feel the device under the skin. This is normal.  During the months and years after  your procedure, your health care provider will check the device, the leads, and the battery every few months. Eventually, when the battery is low, the device will be replaced.  You should receive your defibrillator ID card for your new device in the next 4-8 weeks.  Follow these instructions at home: Medicines  Take over-the-counter and prescription medicines only as told by your health care provider.  If you were prescribed an antibiotic medicine, take it as told by your health care provider. Do not stop taking the antibiotic even if you start to feel  better. Incision care        Follow instructions from your health care provider about how to take care of your incision area. Make sure you: ? Leave stitches (sutures), skin glue, or adhesive strips in place. These skin closures may need to stay in place for 2 weeks or longer. If adhesive strip edges start to loosen and curl up, you may trim the loose edges. Do not remove adhesive strips completely unless your health care provider tells you to do that.  Check your incision area every day for signs of infection. Check for: ? More redness, swelling, or pain. ? More fluid or blood. ? Warmth. ? Pus or a bad smell.  Do not use lotions or ointments near the incision area unless told by your health care provider.  Keep the incision area clean and dry for 7 days after the procedure or for as long as told by your health care provider. It takes several weeks for the incision site to heal completely.  Do not take baths, swim, or use a hot tub until your health care provider approves. Activity  Try to walk a little every day. Exercising is important after this procedure. Also, use your shoulder on the side of the defibrillator in daily tasks that do not require a lot of motion.  For at least 1 week: ? Do not lift your upper arm above your shoulders. This means no tennis, golf, or swimming for this period of time. If you tend to sleep with your arm above your head, use a restraint to prevent this during sleep.  For at least 6 weeks: ? Avoid sudden jerking, pulling, or chopping movements that pull your upper arm far away from your body.  Ask your health care provider when you may go back to work.  Check with your health care provider before you start to drive or play sports. Electric and magnetic fields  Tell all health care providers that you have a defibrillator. This may prevent them from giving you an MRI scan because strong magnets are used for that test.  If you must pass through a  metal detector, quickly walk through it. Do not stop under the detector, and do not stand near it.  Avoid places or objects that have a strong electric or magnetic field, including: ? Airport Herbalist. At the airport, let officials know that you have a defibrillator. Your defibrillator ID card will let you be checked in a way that is safe for you and will not damage your defibrillator. Also, do not let a security person wave a magnetic wand near your defibrillator. That can make it stop working. ? Power plants. ? Large electrical generators. ? Anti-theft systems or electronic article surveillance (EAS). ? Radiofrequency transmission towers, such as cell phone and radio towers.  Do not use amateur (ham) radio equipment or electric (arc) welding torches. Some devices are safe to use if held at  least 12 inches (30 cm) from your defibrillator. These include power tools, lawn mowers, and speakers. If you are unsure if something is safe to use, ask your health care provider.  Do not use MP3 player headphones. They have magnets.  You may safely use electric blankets, heating pads, computers, and microwave ovens.  When using your cell phone, hold it to the ear that is on the opposite side from the defibrillator. Do not leave your cell phone in a pocket over the defibrillator. General instructions  Follow diet instructions from your health care provider, if this applies.  Always keep your defibrillator ID card with you. The card should list the implant date, device model, and manufacturer. Consider wearing a medical alert bracelet or necklace.  Have your defibrillator checked every 3-6 months or as often as told by your health care provider. Most defibrillators last for 4-8 years.  Keep all follow-up visits as told by your health care provider. This is important for your health care provider to make sure your chest is healing the way it should. Ask your health care provider when you should come  back to have your stitches or staples taken out. Contact a health care provider if:  You gain weight suddenly.  Your legs or feet swell more than they have before.  It feels like your heart is fluttering or skipping beats (heart palpitations).  You have more redness, swelling, or pain around your incision.  You have more fluid or blood coming from your incision.  Your incision feels warm to the touch.  You have pus or a bad smell coming from your incision.  You have a fever. Get help right away if:  You have chest pain.  You feel more than one shock.  You feel more short of breath than you have felt before.  You feel more light-headed than you have felt before.  Your incision starts to open up. This information is not intended to replace advice given to you by your health care provider. Make sure you discuss any questions you have with your health care provider.

## 2019-11-08 NOTE — H&P (View-Only) (Signed)
Electrophysiology Rounding Note  Patient Name: [name] Date of Encounter: 11/08/2019  Primary Cardiologist: Shelva Majestic, MD Electrophysiologist: Sanda Klein, MD   Subjective   The patient is doing well today.  At this time, the patient denies chest pain, shortness of breath, or any new concerns.  LHC 11/07/2019 Stable, 80% diagonal LAD present since 2009. No other obstructive disease.  Echo 11/07/2019 LVEF 40-45%, with apical WMA, and abnormal septal motion -> RV PPM.  Inpatient Medications    Scheduled Meds: . cycloSPORINE  1 drop Both Eyes Daily  . metoprolol succinate  37.5 mg Oral Daily  . pravastatin  40 mg Oral Daily  . sodium chloride flush  3 mL Intravenous Q12H   Continuous Infusions: . sodium chloride    . sodium chloride    . sodium chloride 50 mL/hr at 11/08/19 0713  . cefTRIAXone (ROCEPHIN)  IV 1 g (11/08/19 0717)   PRN Meds: sodium chloride, acetaminophen **OR** acetaminophen, HYDROcodone-acetaminophen, morphine injection, ondansetron **OR** ondansetron (ZOFRAN) IV, polyethylene glycol, sodium chloride flush   Vital Signs    Vitals:   11/08/19 0047 11/08/19 0102 11/08/19 0117 11/08/19 0519  BP: 120/67 115/62 119/67 115/65  Pulse: 65 64 64 68  Resp:    20  Temp:    97.8 F (36.6 C)  TempSrc:    Oral  SpO2: 97% 96% 95% 97%  Weight:    75.2 kg  Height:        Intake/Output Summary (Last 24 hours) at 11/08/2019 0721 Last data filed at 11/08/2019 0557 Gross per 24 hour  Intake 696 ml  Output 525 ml  Net 171 ml   Filed Weights   11/06/19 1539 11/08/19 0519  Weight: 77.6 kg 75.2 kg    Physical Exam    GEN- The patient is well appearing, alert and oriented x 3 today.   Head- normocephalic, atraumatic Eyes-  Sclera clear, conjunctiva pink Ears- hearing intact Oropharynx- clear Neck- supple Lungs- Clear to ausculation bilaterally, normal work of breathing Heart- Regular rate and rhythm, no murmurs, rubs or gallops GI- soft,  NT, ND, + BS Extremities- no clubbing, cyanosis, or edema Skin- no rash or lesion Psych- euthymic mood, full affect Neuro- strength and sensation are intact  Labs    CBC Recent Labs    11/06/19 1552 11/08/19 0435  WBC 5.7 5.9  NEUTROABS  --  2.5  HGB 15.1 14.7  HCT 44.0 43.9  MCV 90.5 91.3  PLT 158 0000000*   Basic Metabolic Panel Recent Labs    11/06/19 1552 11/08/19 0435  NA 141 142  K 4.4 4.1  CL 107 108  CO2 25 24  GLUCOSE 118* 100*  BUN 15 19  CREATININE 1.69* 1.70*  CALCIUM 9.5 9.1   Liver Function Tests Recent Labs    11/08/19 0435  AST 23  ALT 19  ALKPHOS 54  BILITOT 1.3*  PROT 6.9  ALBUMIN 3.3*   No results for input(s): LIPASE, AMYLASE in the last 72 hours. Cardiac Enzymes No results for input(s): CKTOTAL, CKMB, CKMBINDEX, TROPONINI in the last 72 hours.   Telemetry    AV paced (personally reviewed)  Radiology    DG Chest 2 View  Result Date: 11/06/2019 CLINICAL DATA:  Onset chest pain yesterday. EXAM: CHEST - 2 VIEW COMPARISON:  PA and lateral chest 07/13/2018 FINDINGS: Lungs appear emphysematous but are clear. Heart size is upper normal. No pneumothorax or pleural fluid. No acute or focal bony abnormality. Pacing device and spinal fusion hardware  unchanged. IMPRESSION: No acute disease. Lungs appear emphysematous. Electronically Signed   By: Inge Rise M.D.   On: 11/06/2019 16:34   CT HEAD WO CONTRAST  Result Date: 11/06/2019 CLINICAL DATA:  Headache and dizziness EXAM: CT HEAD WITHOUT CONTRAST TECHNIQUE: Contiguous axial images were obtained from the base of the skull through the vertex without intravenous contrast. COMPARISON:  November 17, 2016 FINDINGS: Brain: No evidence of acute territorial infarction, hemorrhage, hydrocephalus,extra-axial collection or mass lesion/mass effect. There is dilatation the ventricles and sulci consistent with age-related atrophy. Low-attenuation changes in the deep white matter consistent with small  vessel ischemia. Vascular: No hyperdense vessel or unexpected calcification. Skull: The skull is intact. No fracture or focal lesion identified. Sinuses/Orbits: The visualized paranasal sinuses and mastoid air cells are clear. The orbits and globes intact. Other: None IMPRESSION: No acute intracranial abnormality. Findings consistent with age related atrophy and chronic small vessel ischemia Electronically Signed   By: Prudencio Pair M.D.   On: 11/06/2019 16:57   CARDIAC CATHETERIZATION  Result Date: 11/07/2019  Prox LAD to Mid LAD lesion is 30% stenosed.  1st Diag lesion is 80% stenosed.  1.  Stable 80% stenosis in diagonal branch which was described on previous cardiac catheterization in 2009.  No other obstructive disease.  2.  Normal left ventricular end-diastolic pressure.  Left ventricular angiography was not performed due to chronic kidney disease. Recommendations: No culprit is identified for ventricular tachycardia. Proceed with EP management as planned.   DG Ankle Left Port  Result Date: 11/07/2019 CLINICAL DATA:  Syncopal episode in the parking lot leading to fall. Left foot and ankle pain. EXAM: PORTABLE LEFT ANKLE - 2 VIEW COMPARISON:  None. FINDINGS: Oblique mildly comminuted distal fibular fracture just proximal to the ankle mortise. No additional fracture. No mortise widening. Anterolateral soft tissue edema but no definite joint effusion. Small plantar calcaneal spur. IMPRESSION: Oblique mildly comminuted distal fibular fracture just proximal to the ankle mortise. Associated soft tissue edema. Electronically Signed   By: Keith Rake M.D.   On: 11/07/2019 04:22   DG Foot Complete Left  Result Date: 11/07/2019 CLINICAL DATA:  Syncopal episode in the parking lot leading to fall. Left foot and ankle pain. EXAM: LEFT FOOT - COMPLETE 3+ VIEW COMPARISON:  None. FINDINGS: Distal fibular fracture assessed on concurrent ankle exam. Remote fracture of the fifth metatarsal. No additional  acute fracture of the foot. Mild hallux valgus and degenerative change of the first metatarsal phalangeal joint. Small plantar calcaneal spur. Anterolateral soft tissue edema. IMPRESSION: 1. No acute fracture of the foot. Distal fibular fracture assessed on concurrent ankle exam. 2. Remote fifth metatarsal fracture. Electronically Signed   By: Keith Rake M.D.   On: 11/07/2019 04:23   ECHOCARDIOGRAM COMPLETE  Result Date: 11/07/2019   ECHOCARDIOGRAM REPORT   Patient Name:   [name] Date of Exam: 11/07/2019 Medical Rec #:  [id number]        Height:       68.0 in Accession #:    [id number]       Weight:       171.0 lb Date of Birth:  10/16/1941         BSA:          1.91 m Patient Age:    21 years         BP:           140/81 mmHg Patient Gender: M  HR:           67 bpm. Exam Location:  Inpatient Procedure: 2D Echo Indications:    Syncope 780.2 / R55                 Ventricular Tachycardia I47.2  History:        Patient has prior history of Echocardiogram examinations, most                 recent 04/13/2018. CHF, CAD, Pacemaker; Risk                 Factors:Hypertension. PAD, chronic kidney disease.  Sonographer:    Darlina Sicilian RDCS Referring Phys: [id number] [place]  1. LVEF mildly decreased 40-45%. Hypokinesis of the apical septum/apex. Frequent PVCs which could be coming from this region, cannot exclude ischemia. EF lower compared with 04/13/2018 with new wall motion abnormality.  2. Left ventricular ejection fraction, by visual estimation, is 40 to 45%. The left ventricle has mildly decreased function. There is mildly increased left ventricular hypertrophy.  3. Apical septal segment, apical anterior segment, and apex are abnormal.  4. Abnormal septal motion consistent with RV pacemaker.  5. Left ventricular diastolic parameters are consistent with Grade I diastolic dysfunction (impaired relaxation).  6. The left ventricle demonstrates regional wall motion  abnormalities.  7. Global right ventricle has normal systolic function.The right ventricular size is normal. No increase in right ventricular wall thickness.  8. Left atrial size was normal.  9. Right atrial size was normal. 10. Presence of pericardial fat pad. 11. The mitral valve is grossly normal. Trivial mitral valve regurgitation. 12. The tricuspid valve is grossly normal. Tricuspid valve regurgitation is trivial. 13. The aortic valve is tricuspid. Aortic valve regurgitation is not visualized. Mild aortic valve sclerosis without stenosis. 14. The pulmonic valve was grossly normal. Pulmonic valve regurgitation is not visualized. 15. Mild plaque invoving the ascending aorta. 16. Normal pulmonary artery systolic pressure. 17. The tricuspid regurgitant velocity is 2.13 m/s, and with an assumed right atrial pressure of 3 mmHg, the estimated right ventricular systolic pressure is normal at 21.1 mmHg. 18. A pacer wire is visualized in the RA and RV. 19. The inferior vena cava is normal in size with greater than 50% respiratory variability, suggesting right atrial pressure of 3 mmHg. FINDINGS  Left Ventricle: Left ventricular ejection fraction, by visual estimation, is 40 to 45%. The left ventricle has mildly decreased function. The left ventricle demonstrates regional wall motion abnormalities. There is mildly increased left ventricular hypertrophy. Concentric left ventricular hypertrophy. Abnormal (paradoxical) septal motion, consistent with RV pacemaker. Left ventricular diastolic parameters are consistent with Grade I diastolic dysfunction (impaired relaxation). Normal left atrial pressure. LVEF mildly decreased 40-45%. Hypokinesis of the apical septum/apex. Frequent PVCs which could be coming from this region, cannot exclude ischemia. EF lower compared with 04/13/2018 with new wall motion abnormality.  LV Wall Scoring: The apical septal segment, apical anterior segment, and apex are hypokinetic. Right Ventricle:  The right ventricular size is normal. No increase in right ventricular wall thickness. Global RV systolic function is has normal systolic function. The tricuspid regurgitant velocity is 2.13 m/s, and with an assumed right atrial pressure  of 3 mmHg, the estimated right ventricular systolic pressure is normal at 21.1 mmHg. Left Atrium: Left atrial size was normal in size. Right Atrium: Right atrial size was normal in size Pericardium: There is no evidence of pericardial effusion. Presence of pericardial fat pad. Mitral Valve: The mitral  valve is grossly normal. Trivial mitral valve regurgitation. Tricuspid Valve: The tricuspid valve is grossly normal. Tricuspid valve regurgitation is trivial. Aortic Valve: The aortic valve is tricuspid. Aortic valve regurgitation is not visualized. Mild aortic valve sclerosis is present, with no evidence of aortic valve stenosis. Pulmonic Valve: The pulmonic valve was grossly normal. Pulmonic valve regurgitation is not visualized. Pulmonic regurgitation is not visualized. Aorta: The aortic root and ascending aorta are structurally normal, with no evidence of dilitation. There is mild, protruding plaque involving the ascending aorta. Venous: The inferior vena cava is normal in size with greater than 50% respiratory variability, suggesting right atrial pressure of 3 mmHg. IAS/Shunts: No atrial level shunt detected by color flow Doppler. Additional Comments: A pacer wire is visualized in the right atrium and right ventricle.  LEFT VENTRICLE PLAX 2D LVIDd:         4.89 cm       Diastology LVIDs:         3.21 cm       LV e' lateral:   5.22 cm/s LV PW:         1.10 cm       LV E/e' lateral: 11.9 LV IVS:        1.29 cm       LV e' medial:    4.35 cm/s LVOT diam:     2.20 cm       LV E/e' medial:  14.3 LV SV:         71 ml LV SV Index:   36.58 LVOT Area:     3.80 cm  LV Volumes (MOD) LV area d, A2C:    41.30 cm LV area d, A4C:    36.70 cm LV area s, A2C:    30.80 cm LV area s, A4C:     29.80 cm LV major d, A2C:   9.07 cm LV major d, A4C:   8.60 cm LV major s, A2C:   8.30 cm LV major s, A4C:   8.01 cm LV vol d, MOD A2C: 155.0 ml LV vol d, MOD A4C: 129.0 ml LV vol s, MOD A2C: 95.4 ml LV vol s, MOD A4C: 90.3 ml LV SV MOD A2C:     59.6 ml LV SV MOD A4C:     129.0 ml LV SV MOD BP:      50.9 ml RIGHT VENTRICLE RV S prime:     14.80 cm/s TAPSE (M-mode): 2.1 cm LEFT ATRIUM             Index       RIGHT ATRIUM           Index LA diam:        3.10 cm 1.62 cm/m  RA Area:     13.80 cm LA Vol (A2C):   77.3 ml 40.43 ml/m RA Volume:   31.90 ml  16.68 ml/m LA Vol (A4C):   44.7 ml 23.38 ml/m LA Biplane Vol: 61.6 ml 32.22 ml/m  AORTIC VALVE LVOT Vmax:   64.00 cm/s LVOT Vmean:  43.700 cm/s LVOT VTI:    0.132 m  AORTA Ao Root diam: 3.00 cm Ao Asc diam:  3.00 cm MITRAL VALVE                        TRICUSPID VALVE MV Area (PHT): 2.26 cm             TR Peak grad:   18.1 mmHg MV PHT:  97.15 msec           TR Vmax:        245.00 cm/s MV Decel Time: 335 msec MV E velocity: 62.10 cm/s 103 cm/s  SHUNTS MV A velocity: 84.80 cm/s 70.3 cm/s Systemic VTI:  0.13 m MV E/A ratio:  0.73       1.5       Systemic Diam: 2.20 cm  Eleonore Chiquito MD Electronically signed by Eleonore Chiquito MD Signature Date/Time: 11/07/2019/1:15:12 PM    Final     Patient Profile     [name] is a 78 y.o. 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 Zigmund Daniel.  Assessment & Plan    1.  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  No further VT on tele. EF 40-45% down from normal EF last year.  Stable CAD on cath 12/16. No "culprit" lesion.  Not on AAD. He has previously tolerated amiodarone well for PVC suppression (weaned off s/p ablation) Have discussed risks and  benefits of ICD upgrade +/- LV lead with patient who verbalizes understanding and agrees to proceed. Will finalize plan with Dr. Lovena Le.   2. 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   3. CAD  Stable CAD on cath 11/07/2019   4. Paroxysmal atrial fibrillation Continue eliquis for CHA2DS2VASC of 5    5. Left fibular fracture Per IM.   For questions or updates, please contact [place] Please consult [email or web address] for contact info under Cardiology/STEMI.  Signed, Shirley Friar, PA-C  11/08/2019, 7:21 AM   EP Attending  Patient seen and examined. Agree with the findings as noted above. The patient is referred by Dr. Curt Bears for consideration of a biv ICD. He has CHB and an ischemic CM and has had a reduction in his EF. 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 With his VT, CHB, ICM, it is felt that upgrade from a DDD PPM to a Biv ICD is most appropriate. A DDD ICD will not treat his pacing induced LBBB/CHF. I have discussed the indications/risks/benefits/goals/expectations of ICD insertion and he wishes to proceed.  Mikle Bosworth.D.

## 2019-11-08 NOTE — Progress Notes (Signed)
Patient transported to procedure area alert with no distress off monitor CCMD notified.

## 2019-11-08 NOTE — Interval H&P Note (Signed)
History and Physical Interval Note:  11/08/2019 10:26 AM  [name]  has presented today for surgery, with the diagnosis of upgrade.  The various methods of treatment have been discussed with the patient and family. After consideration of risks, benefits and other options for treatment, the patient has consented to  Procedure(s): BIV UPGRADE (N/A) as a surgical intervention.  The patient's history has been reviewed, patient examined, no change in status, stable for surgery.  I have reviewed the patient's chart and labs.  Questions were answered to the patient's satisfaction.     Cristopher Peru

## 2019-11-08 NOTE — Progress Notes (Signed)
PROGRESS NOTE    [name]  [id number] DOB: October 07, 1941 DOA: 11/06/2019 PCP: Lucianne Lei, MD   Brief Narrative: 78 y.o. male with medical history significant for coronary artery disease, heart block with pacer, atrial fibrillation on Eliquis, chronic kidney disease stage III, nonischemic cardiomyopathy, and paroxysmal ventricular tachycardia, now presenting to the emergency department after syncopal episode.  Patient reports that he been in his usual state of health, was having an uneventful day, and was walking through a parking lot when he developed sudden loss of consciousness.  He reports having chest pain the day prior to this (11/05/2019), but did not experience any chest discomfort just prior to the episode.  He is on certain of what happened but regained awareness with pain at the back of his head and left ankle.  Patient denies any recent fevers, chills, cough, or shortness of breath.  Denies any change in vision or hearing or focal numbness or weakness.  In 2012, he had a EF as low as 2025%, wore a LifeVest, but then had improvement in EF to 35 to 40%.  Most recent echocardiogram in EMR from May 2019 featured EF 50 to 55%.  In 2016, patient had episodes of nonsustained ventricular tachycardia during the stress test.  ED Course: Upon arrival to the ED, patient is found to be afebrile, saturating well on room air, and with stable blood pressure.  EKG features a paced rhythm with PVCs.  Chest x-ray is negative for acute findings.  Radiographs of the left ankle demonstrate mildly comminuted distal fibula fracture.  Chemistry panel notable for creatinine 1.69, similar to priors.  Noncontrast head CT negative for acute intracranial abnormality.  CBC unremarkable.  Troponin elevated to 27, then further elevated to 75.  Urinalysis is nitrite positive.  Urine was sent for culture in the ED, empiric Rocephin was started, COVID-19 screening test is in process, cardiology was consulted by the ED  physician, and medical admission was recommended.  Assessment & Plan:   Active Problems:   Chronic diastolic CHF (congestive heart failure) (HCC)   CKD (chronic kidney disease) stage 3, GFR 30-59 ml/min   Ventricular tachycardia (paroxysmal) (HCC)   Acute lower UTI   Paroxysmal atrial fibrillation (HCC)   Cardiac syncope   Closed fracture of distal fibula   #1 syncope secondary to V. Tach-patient had cath 11/07/2019-no culprit lesion per cardiology.  Plan is to upgrade to biventricular ICD to treat his pacing induced left bundle branch block and CHF.  They are also considering to put him back on amiodarone which he was on prior to ablation. Echo 40 to 45% ejection fraction.  #2 paroxysmal atrial fibrillation on metoprolol.  Was on Eliquis prior to admission.  Restart when okay with cardiology.  #3 left fibular fracture in boots-this is from the syncopal episode he had prior to admission.  Mildly commuted distal left fibula fracture on plain films.  He will need outpatient Ortho follow-up on discharge.  #4 CKD stage III stable creatinine 1.70 stable compared to yesterday.  Monitor closely.  #5 question UTI  since the UA appeared dirty he was started on Rocephin.  Urine culture no growth.  DC Rocephin.  #6 hyperlipidemia continue statin.    Estimated body mass index is 25.21 kg/m as calculated from the following:   Height as of this encounter: 5\' 8"  (1.727 m).   Weight as of this encounter: 75.2 kg.    Subjective:  Patient is resting in bed he denies any further chest pain  shortness of breath. Objective: Vitals:   11/08/19 1402 11/08/19 1417 11/08/19 1432 11/08/19 1502  BP: 125/72 139/76 130/71 126/85  Pulse: (!) 59 65 60 69  Resp:      Temp:      TempSrc:      SpO2: 97% 97% 98% 96%  Weight:      Height:        Intake/Output Summary (Last 24 hours) at 11/08/2019 1619 Last data filed at 11/08/2019 1525 Gross per 24 hour  Intake 1336 ml  Output 725 ml  Net 611 ml    Filed Weights   11/06/19 1539 11/08/19 0519  Weight: 77.6 kg 75.2 kg    Examination:  General exam: Appears calm and comfortable  Respiratory system: Clear to auscultation. Respiratory effort normal. Cardiovascular system: S1 & S2 heard, RRR. No JVD, murmurs, rubs, gallops or clicks. No pedal edema. Gastrointestinal system: Abdomen is nondistended, soft and nontender. No organomegaly or masses felt. Normal bowel sounds heard. Central nervous system: Alert and oriented. No focal neurological deficits. Extremities: Symmetric 5 x 5 power. Skin: No rashes, lesions or ulcers Psychiatry: Judgement and insight appear normal. Mood & affect appropriate.     Data Reviewed: I have personally reviewed following labs and imaging studies  CBC: Recent Labs  Lab 11/06/19 1552 11/08/19 0435  WBC 5.7 5.9  NEUTROABS  --  2.5  HGB 15.1 14.7  HCT 44.0 43.9  MCV 90.5 91.3  PLT 158 0000000*   Basic Metabolic Panel: Recent Labs  Lab 11/06/19 1552 11/08/19 0435  NA 141 142  K 4.4 4.1  CL 107 108  CO2 25 24  GLUCOSE 118* 100*  BUN 15 19  CREATININE 1.69* 1.70*  CALCIUM 9.5 9.1   GFR: Estimated Creatinine Clearance: 33.5 mL/min (A) (by C-G formula based on SCr of 1.7 mg/dL (H)). Liver Function Tests: Recent Labs  Lab 11/08/19 0435  AST 23  ALT 19  ALKPHOS 54  BILITOT 1.3*  PROT 6.9  ALBUMIN 3.3*   No results for input(s): LIPASE, AMYLASE in the last 168 hours. No results for input(s): AMMONIA in the last 168 hours. Coagulation Profile: No results for input(s): INR, PROTIME in the last 168 hours. Cardiac Enzymes: No results for input(s): CKTOTAL, CKMB, CKMBINDEX, TROPONINI in the last 168 hours. BNP (last 3 results) No results for input(s): PROBNP in the last 8760 hours. HbA1C: No results for input(s): HGBA1C in the last 72 hours. CBG: Recent Labs  Lab 11/06/19 1551 11/08/19 0741  GLUCAP 96 92   Lipid Profile: No results for input(s): CHOL, HDL, LDLCALC, TRIG,  CHOLHDL, LDLDIRECT in the last 72 hours. Thyroid Function Tests: No results for input(s): TSH, T4TOTAL, FREET4, T3FREE, THYROIDAB in the last 72 hours. Anemia Panel: No results for input(s): VITAMINB12, FOLATE, FERRITIN, TIBC, IRON, RETICCTPCT in the last 72 hours. Sepsis Labs: No results for input(s): PROCALCITON, LATICACIDVEN in the last 168 hours.  Recent Results (from the past 240 hour(s))  Urine culture     Status: Abnormal   Collection Time: 11/06/19  8:17 AM   Specimen: Urine, Random  Result Value Ref Range Status   Specimen Description URINE, RANDOM  Final   Special Requests   Final    NONE Performed at [facility], [address], [place], [place] [address]    Culture MULTIPLE SPECIES PRESENT, SUGGEST RECOLLECTION (A)  Final   Report Status 11/08/2019 FINAL  Final  SARS CORONAVIRUS 2 (TAT 6-24 HRS) Nasopharyngeal Nasopharyngeal Swab  Status: None   Collection Time: 11/07/19  4:27 AM   Specimen: Nasopharyngeal Swab  Result Value Ref Range Status   SARS Coronavirus 2 NEGATIVE NEGATIVE Final    Comment: (NOTE) SARS-CoV-2 target nucleic acids are NOT DETECTED. The SARS-CoV-2 RNA is generally detectable in upper and lower respiratory specimens during the acute phase of infection. Negative results do not preclude SARS-CoV-2 infection, do not rule out co-infections with other pathogens, and should not be used as the sole basis for treatment or other patient management decisions. Negative results must be combined with clinical observations, patient history, and epidemiological information. The expected result is Negative. Fact Sheet for Patients: SugarRoll.be Fact Sheet for Healthcare Providers: https://www.[employer] [employer].com/ This test is not yet approved or cleared by the [place] FDA and  has been authorized for detection and/or diagnosis of SARS-CoV-2 by FDA under an Emergency Use Authorization (EUA). This EUA will  remain  in effect (meaning this test can be used) for the duration of the COVID-19 declaration under Section 56 4(b)(1) of the Act, 21 U.S.C. section 360bbb-3(b)(1), unless the authorization is terminated or revoked sooner. Performed at [facility], [place] [address], [place], [place] [address]   Surgical PCR screen     Status: None   Collection Time: 11/08/19  8:15 AM   Specimen: Nasal Mucosa; Nasal Swab  Result Value Ref Range Status   MRSA, PCR NEGATIVE NEGATIVE Final   Staphylococcus aureus NEGATIVE NEGATIVE Final    Comment: (NOTE) The Xpert SA Assay (FDA approved for NASAL specimens in patients 75 years of age and older), is one component of a comprehensive surveillance program. It is not intended to diagnose infection nor to guide or monitor treatment. Performed at [facility], [place] [address] [place], [place] [address]          Radiology Studies: DG Chest 2 View  Result Date: 11/06/2019 CLINICAL DATA:  Onset chest pain yesterday. EXAM: CHEST - 2 VIEW COMPARISON:  PA and lateral chest 07/13/2018 FINDINGS: Lungs appear emphysematous but are clear. Heart size is upper normal. No pneumothorax or pleural fluid. No acute or focal bony abnormality. Pacing device and spinal fusion hardware unchanged. IMPRESSION: No acute disease. Lungs appear emphysematous. Electronically Signed   By: Inge Rise M.D.   On: 11/06/2019 16:34   CT HEAD WO CONTRAST  Result Date: 11/06/2019 CLINICAL DATA:  Headache and dizziness EXAM: CT HEAD WITHOUT CONTRAST TECHNIQUE: Contiguous axial images were obtained from the base of the skull through the vertex without intravenous contrast. COMPARISON:  November 17, 2016 FINDINGS: Brain: No evidence of acute territorial infarction, hemorrhage, hydrocephalus,extra-axial collection or mass lesion/mass effect. There is dilatation the ventricles and sulci consistent with age-related atrophy. Low-attenuation changes in the deep white matter  consistent with small vessel ischemia. Vascular: No hyperdense vessel or unexpected calcification. Skull: The skull is intact. No fracture or focal lesion identified. Sinuses/Orbits: The visualized paranasal sinuses and mastoid air cells are clear. The orbits and globes intact. Other: None IMPRESSION: No acute intracranial abnormality. Findings consistent with age related atrophy and chronic small vessel ischemia Electronically Signed   By: Prudencio Pair M.D.   On: 11/06/2019 16:57   CARDIAC CATHETERIZATION  Result Date: 11/07/2019  Prox LAD to Mid LAD lesion is 30% stenosed.  1st Diag lesion is 80% stenosed.  1.  Stable 80% stenosis in diagonal branch which was described on previous cardiac catheterization in 2009.  No other obstructive disease.  2.  Normal left ventricular end-diastolic pressure.  Left ventricular angiography was not performed due to chronic kidney disease. Recommendations: No culprit is identified for ventricular tachycardia. Proceed with EP management as planned.   EP PPM/ICD IMPLANT  Result Date: 11/08/2019 Conclusion: Successful upgrade of a dual-chamber pacemaker to a biventricular ICD in a patient with sustained monomorphic ventricular tachycardia, syncope, and ejection fraction of 40% with pacing induced left bundle branch block, QRS duration 180 ms. Crissie Sickles, MD  DG Ankle Left Port  Result Date: 11/07/2019 CLINICAL DATA:  Syncopal episode in the parking lot leading to fall. Left foot and ankle pain. EXAM: PORTABLE LEFT ANKLE - 2 VIEW COMPARISON:  None. FINDINGS: Oblique mildly comminuted distal fibular fracture just proximal to the ankle mortise. No additional fracture. No mortise widening. Anterolateral soft tissue edema but no definite joint effusion. Small plantar calcaneal spur. IMPRESSION: Oblique mildly comminuted distal fibular fracture just proximal to the ankle mortise. Associated soft tissue edema. Electronically Signed   By: Keith Rake M.D.   On:  11/07/2019 04:22   DG Foot Complete Left  Result Date: 11/07/2019 CLINICAL DATA:  Syncopal episode in the parking lot leading to fall. Left foot and ankle pain. EXAM: LEFT FOOT - COMPLETE 3+ VIEW COMPARISON:  None. FINDINGS: Distal fibular fracture assessed on concurrent ankle exam. Remote fracture of the fifth metatarsal. No additional acute fracture of the foot. Mild hallux valgus and degenerative change of the first metatarsal phalangeal joint. Small plantar calcaneal spur. Anterolateral soft tissue edema. IMPRESSION: 1. No acute fracture of the foot. Distal fibular fracture assessed on concurrent ankle exam. 2. Remote fifth metatarsal fracture. Electronically Signed   By: Keith Rake M.D.   On: 11/07/2019 04:23   ECHOCARDIOGRAM COMPLETE  Result Date: 11/07/2019   ECHOCARDIOGRAM REPORT   Patient Name:   [name] Date of Exam: 11/07/2019 Medical Rec #:  [id number]        Height:       68.0 in Accession #:    [id number]       Weight:       171.0 lb Date of Birth:  1941-09-04         BSA:          1.91 m Patient Age:    55 years         BP:           140/81 mmHg Patient Gender: M                HR:           67 bpm. Exam Location:  Inpatient Procedure: 2D Echo Indications:    Syncope 780.2 / R55                 Ventricular Tachycardia I47.2  History:        Patient has prior history of Echocardiogram examinations, most                 recent 04/13/2018. CHF, CAD, Pacemaker; Risk                 Factors:Hypertension. PAD, chronic kidney disease.  Sonographer:    Darlina Sicilian RDCS Referring Phys: [id number] [place]  1. LVEF mildly decreased 40-45%. Hypokinesis of the apical septum/apex. Frequent PVCs which could be coming from this region, cannot exclude ischemia. EF lower compared with 04/13/2018 with new wall motion abnormality.  2. Left ventricular ejection fraction, by visual estimation, is 40 to 45%. The left ventricle has mildly decreased function.  There is mildly increased  left ventricular hypertrophy.  3. Apical septal segment, apical anterior segment, and apex are abnormal.  4. Abnormal septal motion consistent with RV pacemaker.  5. Left ventricular diastolic parameters are consistent with Grade I diastolic dysfunction (impaired relaxation).  6. The left ventricle demonstrates regional wall motion abnormalities.  7. Global right ventricle has normal systolic function.The right ventricular size is normal. No increase in right ventricular wall thickness.  8. Left atrial size was normal.  9. Right atrial size was normal. 10. Presence of pericardial fat pad. 11. The mitral valve is grossly normal. Trivial mitral valve regurgitation. 12. The tricuspid valve is grossly normal. Tricuspid valve regurgitation is trivial. 13. The aortic valve is tricuspid. Aortic valve regurgitation is not visualized. Mild aortic valve sclerosis without stenosis. 14. The pulmonic valve was grossly normal. Pulmonic valve regurgitation is not visualized. 15. Mild plaque invoving the ascending aorta. 16. Normal pulmonary artery systolic pressure. 17. The tricuspid regurgitant velocity is 2.13 m/s, and with an assumed right atrial pressure of 3 mmHg, the estimated right ventricular systolic pressure is normal at 21.1 mmHg. 18. A pacer wire is visualized in the RA and RV. 19. The inferior vena cava is normal in size with greater than 50% respiratory variability, suggesting right atrial pressure of 3 mmHg. FINDINGS  Left Ventricle: Left ventricular ejection fraction, by visual estimation, is 40 to 45%. The left ventricle has mildly decreased function. The left ventricle demonstrates regional wall motion abnormalities. There is mildly increased left ventricular hypertrophy. Concentric left ventricular hypertrophy. Abnormal (paradoxical) septal motion, consistent with RV pacemaker. Left ventricular diastolic parameters are consistent with Grade I diastolic dysfunction (impaired relaxation). Normal left atrial  pressure. LVEF mildly decreased 40-45%. Hypokinesis of the apical septum/apex. Frequent PVCs which could be coming from this region, cannot exclude ischemia. EF lower compared with 04/13/2018 with new wall motion abnormality.  LV Wall Scoring: The apical septal segment, apical anterior segment, and apex are hypokinetic. Right Ventricle: The right ventricular size is normal. No increase in right ventricular wall thickness. Global RV systolic function is has normal systolic function. The tricuspid regurgitant velocity is 2.13 m/s, and with an assumed right atrial pressure  of 3 mmHg, the estimated right ventricular systolic pressure is normal at 21.1 mmHg. Left Atrium: Left atrial size was normal in size. Right Atrium: Right atrial size was normal in size Pericardium: There is no evidence of pericardial effusion. Presence of pericardial fat pad. Mitral Valve: The mitral valve is grossly normal. Trivial mitral valve regurgitation. Tricuspid Valve: The tricuspid valve is grossly normal. Tricuspid valve regurgitation is trivial. Aortic Valve: The aortic valve is tricuspid. Aortic valve regurgitation is not visualized. Mild aortic valve sclerosis is present, with no evidence of aortic valve stenosis. Pulmonic Valve: The pulmonic valve was grossly normal. Pulmonic valve regurgitation is not visualized. Pulmonic regurgitation is not visualized. Aorta: The aortic root and ascending aorta are structurally normal, with no evidence of dilitation. There is mild, protruding plaque involving the ascending aorta. Venous: The inferior vena cava is normal in size with greater than 50% respiratory variability, suggesting right atrial pressure of 3 mmHg. IAS/Shunts: No atrial level shunt detected by color flow Doppler. Additional Comments: A pacer wire is visualized in the right atrium and right ventricle.  LEFT VENTRICLE PLAX 2D LVIDd:         4.89 cm       Diastology LVIDs:         3.21 cm  LV e' lateral:   5.22 cm/s LV PW:          1.10 cm       LV E/e' lateral: 11.9 LV IVS:        1.29 cm       LV e' medial:    4.35 cm/s LVOT diam:     2.20 cm       LV E/e' medial:  14.3 LV SV:         71 ml LV SV Index:   36.58 LVOT Area:     3.80 cm  LV Volumes (MOD) LV area d, A2C:    41.30 cm LV area d, A4C:    36.70 cm LV area s, A2C:    30.80 cm LV area s, A4C:    29.80 cm LV major d, A2C:   9.07 cm LV major d, A4C:   8.60 cm LV major s, A2C:   8.30 cm LV major s, A4C:   8.01 cm LV vol d, MOD A2C: 155.0 ml LV vol d, MOD A4C: 129.0 ml LV vol s, MOD A2C: 95.4 ml LV vol s, MOD A4C: 90.3 ml LV SV MOD A2C:     59.6 ml LV SV MOD A4C:     129.0 ml LV SV MOD BP:      50.9 ml RIGHT VENTRICLE RV S prime:     14.80 cm/s TAPSE (M-mode): 2.1 cm LEFT ATRIUM             Index       RIGHT ATRIUM           Index LA diam:        3.10 cm 1.62 cm/m  RA Area:     13.80 cm LA Vol (A2C):   77.3 ml 40.43 ml/m RA Volume:   31.90 ml  16.68 ml/m LA Vol (A4C):   44.7 ml 23.38 ml/m LA Biplane Vol: 61.6 ml 32.22 ml/m  AORTIC VALVE LVOT Vmax:   64.00 cm/s LVOT Vmean:  43.700 cm/s LVOT VTI:    0.132 m  AORTA Ao Root diam: 3.00 cm Ao Asc diam:  3.00 cm MITRAL VALVE                        TRICUSPID VALVE MV Area (PHT): 2.26 cm             TR Peak grad:   18.1 mmHg MV PHT:        97.15 msec           TR Vmax:        245.00 cm/s MV Decel Time: 335 msec MV E velocity: 62.10 cm/s 103 cm/s  SHUNTS MV A velocity: 84.80 cm/s 70.3 cm/s Systemic VTI:  0.13 m MV E/A ratio:  0.73       1.5       Systemic Diam: 2.20 cm  Eleonore Chiquito MD Electronically signed by Eleonore Chiquito MD Signature Date/Time: 11/07/2019/1:15:12 PM    Final         Scheduled Meds: . cycloSPORINE  1 drop Both Eyes Daily  . metoprolol succinate  37.5 mg Oral Daily  . pravastatin  40 mg Oral Daily  . sodium chloride flush  3 mL Intravenous Q12H   Continuous Infusions: . sodium chloride    .  ceFAZolin (ANCEF) IV    . cefTRIAXone (ROCEPHIN)  IV 1 g (11/08/19 0717)     LOS: 1 day  Georgette Shell, MD [facility]  If 7PM-7AM, please contact night-coverage [email or web address] Password [email or web address] 11/08/2019, 4:19 PM

## 2019-11-08 NOTE — Progress Notes (Signed)
Electrophysiology Rounding Note  Patient Name: [name] Date of Encounter: 11/08/2019  Primary Cardiologist: Shelva Majestic, MD Electrophysiologist: Sanda Klein, MD   Subjective   The patient is doing well today.  At this time, the patient denies chest pain, shortness of breath, or any new concerns.  LHC 11/07/2019 Stable, 80% diagonal LAD present since 2009. No other obstructive disease.  Echo 11/07/2019 LVEF 40-45%, with apical WMA, and abnormal septal motion -> RV PPM.  Inpatient Medications    Scheduled Meds: . cycloSPORINE  1 drop Both Eyes Daily  . metoprolol succinate  37.5 mg Oral Daily  . pravastatin  40 mg Oral Daily  . sodium chloride flush  3 mL Intravenous Q12H   Continuous Infusions: . sodium chloride    . sodium chloride    . sodium chloride 50 mL/hr at 11/08/19 0713  . cefTRIAXone (ROCEPHIN)  IV 1 g (11/08/19 0717)   PRN Meds: sodium chloride, acetaminophen **OR** acetaminophen, HYDROcodone-acetaminophen, morphine injection, ondansetron **OR** ondansetron (ZOFRAN) IV, polyethylene glycol, sodium chloride flush   Vital Signs    Vitals:   11/08/19 0047 11/08/19 0102 11/08/19 0117 11/08/19 0519  BP: 120/67 115/62 119/67 115/65  Pulse: 65 64 64 68  Resp:    20  Temp:    97.8 F (36.6 C)  TempSrc:    Oral  SpO2: 97% 96% 95% 97%  Weight:    75.2 kg  Height:        Intake/Output Summary (Last 24 hours) at 11/08/2019 0721 Last data filed at 11/08/2019 0557 Gross per 24 hour  Intake 696 ml  Output 525 ml  Net 171 ml   Filed Weights   11/06/19 1539 11/08/19 0519  Weight: 77.6 kg 75.2 kg    Physical Exam    GEN- The patient is well appearing, alert and oriented x 3 today.   Head- normocephalic, atraumatic Eyes-  Sclera clear, conjunctiva pink Ears- hearing intact Oropharynx- clear Neck- supple Lungs- Clear to ausculation bilaterally, normal work of breathing Heart- Regular rate and rhythm, no murmurs, rubs or gallops GI- soft,  NT, ND, + BS Extremities- no clubbing, cyanosis, or edema Skin- no rash or lesion Psych- euthymic mood, full affect Neuro- strength and sensation are intact  Labs    CBC Recent Labs    11/06/19 1552 11/08/19 0435  WBC 5.7 5.9  NEUTROABS  --  2.5  HGB 15.1 14.7  HCT 44.0 43.9  MCV 90.5 91.3  PLT 158 0000000*   Basic Metabolic Panel Recent Labs    11/06/19 1552 11/08/19 0435  NA 141 142  K 4.4 4.1  CL 107 108  CO2 25 24  GLUCOSE 118* 100*  BUN 15 19  CREATININE 1.69* 1.70*  CALCIUM 9.5 9.1   Liver Function Tests Recent Labs    11/08/19 0435  AST 23  ALT 19  ALKPHOS 54  BILITOT 1.3*  PROT 6.9  ALBUMIN 3.3*   No results for input(s): LIPASE, AMYLASE in the last 72 hours. Cardiac Enzymes No results for input(s): CKTOTAL, CKMB, CKMBINDEX, TROPONINI in the last 72 hours.   Telemetry    AV paced (personally reviewed)  Radiology    DG Chest 2 View  Result Date: 11/06/2019 CLINICAL DATA:  Onset chest pain yesterday. EXAM: CHEST - 2 VIEW COMPARISON:  PA and lateral chest 07/13/2018 FINDINGS: Lungs appear emphysematous but are clear. Heart size is upper normal. No pneumothorax or pleural fluid. No acute or focal bony abnormality. Pacing device and spinal fusion hardware  unchanged. IMPRESSION: No acute disease. Lungs appear emphysematous. Electronically Signed   By: Inge Rise M.D.   On: 11/06/2019 16:34   CT HEAD WO CONTRAST  Result Date: 11/06/2019 CLINICAL DATA:  Headache and dizziness EXAM: CT HEAD WITHOUT CONTRAST TECHNIQUE: Contiguous axial images were obtained from the base of the skull through the vertex without intravenous contrast. COMPARISON:  November 17, 2016 FINDINGS: Brain: No evidence of acute territorial infarction, hemorrhage, hydrocephalus,extra-axial collection or mass lesion/mass effect. There is dilatation the ventricles and sulci consistent with age-related atrophy. Low-attenuation changes in the deep white matter consistent with small  vessel ischemia. Vascular: No hyperdense vessel or unexpected calcification. Skull: The skull is intact. No fracture or focal lesion identified. Sinuses/Orbits: The visualized paranasal sinuses and mastoid air cells are clear. The orbits and globes intact. Other: None IMPRESSION: No acute intracranial abnormality. Findings consistent with age related atrophy and chronic small vessel ischemia Electronically Signed   By: Prudencio Pair M.D.   On: 11/06/2019 16:57   CARDIAC CATHETERIZATION  Result Date: 11/07/2019  Prox LAD to Mid LAD lesion is 30% stenosed.  1st Diag lesion is 80% stenosed.  1.  Stable 80% stenosis in diagonal branch which was described on previous cardiac catheterization in 2009.  No other obstructive disease.  2.  Normal left ventricular end-diastolic pressure.  Left ventricular angiography was not performed due to chronic kidney disease. Recommendations: No culprit is identified for ventricular tachycardia. Proceed with EP management as planned.   DG Ankle Left Port  Result Date: 11/07/2019 CLINICAL DATA:  Syncopal episode in the parking lot leading to fall. Left foot and ankle pain. EXAM: PORTABLE LEFT ANKLE - 2 VIEW COMPARISON:  None. FINDINGS: Oblique mildly comminuted distal fibular fracture just proximal to the ankle mortise. No additional fracture. No mortise widening. Anterolateral soft tissue edema but no definite joint effusion. Small plantar calcaneal spur. IMPRESSION: Oblique mildly comminuted distal fibular fracture just proximal to the ankle mortise. Associated soft tissue edema. Electronically Signed   By: Keith Rake M.D.   On: 11/07/2019 04:22   DG Foot Complete Left  Result Date: 11/07/2019 CLINICAL DATA:  Syncopal episode in the parking lot leading to fall. Left foot and ankle pain. EXAM: LEFT FOOT - COMPLETE 3+ VIEW COMPARISON:  None. FINDINGS: Distal fibular fracture assessed on concurrent ankle exam. Remote fracture of the fifth metatarsal. No additional  acute fracture of the foot. Mild hallux valgus and degenerative change of the first metatarsal phalangeal joint. Small plantar calcaneal spur. Anterolateral soft tissue edema. IMPRESSION: 1. No acute fracture of the foot. Distal fibular fracture assessed on concurrent ankle exam. 2. Remote fifth metatarsal fracture. Electronically Signed   By: Keith Rake M.D.   On: 11/07/2019 04:23   ECHOCARDIOGRAM COMPLETE  Result Date: 11/07/2019   ECHOCARDIOGRAM REPORT   Patient Name:   GRADIN KASPAR Date of Exam: 11/07/2019 Medical Rec #:  [id number]        Height:       68.0 in Accession #:    [id number]       Weight:       171.0 lb Date of Birth:  1941/08/14         BSA:          1.91 m Patient Age:    1 years         BP:           140/81 mmHg Patient Gender: M  HR:           67 bpm. Exam Location:  Inpatient Procedure: 2D Echo Indications:    Syncope 780.2 / R55                 Ventricular Tachycardia I47.2  History:        Patient has prior history of Echocardiogram examinations, most                 recent 04/13/2018. CHF, CAD, Pacemaker; Risk                 Factors:Hypertension. PAD, chronic kidney disease.  Sonographer:    Darlina Sicilian RDCS Referring Phys: [id number] [place]  1. LVEF mildly decreased 40-45%. Hypokinesis of the apical septum/apex. Frequent PVCs which could be coming from this region, cannot exclude ischemia. EF lower compared with 04/13/2018 with new wall motion abnormality.  2. Left ventricular ejection fraction, by visual estimation, is 40 to 45%. The left ventricle has mildly decreased function. There is mildly increased left ventricular hypertrophy.  3. Apical septal segment, apical anterior segment, and apex are abnormal.  4. Abnormal septal motion consistent with RV pacemaker.  5. Left ventricular diastolic parameters are consistent with Grade I diastolic dysfunction (impaired relaxation).  6. The left ventricle demonstrates regional wall motion  abnormalities.  7. Global right ventricle has normal systolic function.The right ventricular size is normal. No increase in right ventricular wall thickness.  8. Left atrial size was normal.  9. Right atrial size was normal. 10. Presence of pericardial fat pad. 11. The mitral valve is grossly normal. Trivial mitral valve regurgitation. 12. The tricuspid valve is grossly normal. Tricuspid valve regurgitation is trivial. 13. The aortic valve is tricuspid. Aortic valve regurgitation is not visualized. Mild aortic valve sclerosis without stenosis. 14. The pulmonic valve was grossly normal. Pulmonic valve regurgitation is not visualized. 15. Mild plaque invoving the ascending aorta. 16. Normal pulmonary artery systolic pressure. 17. The tricuspid regurgitant velocity is 2.13 m/s, and with an assumed right atrial pressure of 3 mmHg, the estimated right ventricular systolic pressure is normal at 21.1 mmHg. 18. A pacer wire is visualized in the RA and RV. 19. The inferior vena cava is normal in size with greater than 50% respiratory variability, suggesting right atrial pressure of 3 mmHg. FINDINGS  Left Ventricle: Left ventricular ejection fraction, by visual estimation, is 40 to 45%. The left ventricle has mildly decreased function. The left ventricle demonstrates regional wall motion abnormalities. There is mildly increased left ventricular hypertrophy. Concentric left ventricular hypertrophy. Abnormal (paradoxical) septal motion, consistent with RV pacemaker. Left ventricular diastolic parameters are consistent with Grade I diastolic dysfunction (impaired relaxation). Normal left atrial pressure. LVEF mildly decreased 40-45%. Hypokinesis of the apical septum/apex. Frequent PVCs which could be coming from this region, cannot exclude ischemia. EF lower compared with 04/13/2018 with new wall motion abnormality.  LV Wall Scoring: The apical septal segment, apical anterior segment, and apex are hypokinetic. Right Ventricle:  The right ventricular size is normal. No increase in right ventricular wall thickness. Global RV systolic function is has normal systolic function. The tricuspid regurgitant velocity is 2.13 m/s, and with an assumed right atrial pressure  of 3 mmHg, the estimated right ventricular systolic pressure is normal at 21.1 mmHg. Left Atrium: Left atrial size was normal in size. Right Atrium: Right atrial size was normal in size Pericardium: There is no evidence of pericardial effusion. Presence of pericardial fat pad. Mitral Valve: The mitral  valve is grossly normal. Trivial mitral valve regurgitation. Tricuspid Valve: The tricuspid valve is grossly normal. Tricuspid valve regurgitation is trivial. Aortic Valve: The aortic valve is tricuspid. Aortic valve regurgitation is not visualized. Mild aortic valve sclerosis is present, with no evidence of aortic valve stenosis. Pulmonic Valve: The pulmonic valve was grossly normal. Pulmonic valve regurgitation is not visualized. Pulmonic regurgitation is not visualized. Aorta: The aortic root and ascending aorta are structurally normal, with no evidence of dilitation. There is mild, protruding plaque involving the ascending aorta. Venous: The inferior vena cava is normal in size with greater than 50% respiratory variability, suggesting right atrial pressure of 3 mmHg. IAS/Shunts: No atrial level shunt detected by color flow Doppler. Additional Comments: A pacer wire is visualized in the right atrium and right ventricle.  LEFT VENTRICLE PLAX 2D LVIDd:         4.89 cm       Diastology LVIDs:         3.21 cm       LV e' lateral:   5.22 cm/s LV PW:         1.10 cm       LV E/e' lateral: 11.9 LV IVS:        1.29 cm       LV e' medial:    4.35 cm/s LVOT diam:     2.20 cm       LV E/e' medial:  14.3 LV SV:         71 ml LV SV Index:   36.58 LVOT Area:     3.80 cm  LV Volumes (MOD) LV area d, A2C:    41.30 cm LV area d, A4C:    36.70 cm LV area s, A2C:    30.80 cm LV area s, A4C:     29.80 cm LV major d, A2C:   9.07 cm LV major d, A4C:   8.60 cm LV major s, A2C:   8.30 cm LV major s, A4C:   8.01 cm LV vol d, MOD A2C: 155.0 ml LV vol d, MOD A4C: 129.0 ml LV vol s, MOD A2C: 95.4 ml LV vol s, MOD A4C: 90.3 ml LV SV MOD A2C:     59.6 ml LV SV MOD A4C:     129.0 ml LV SV MOD BP:      50.9 ml RIGHT VENTRICLE RV S prime:     14.80 cm/s TAPSE (M-mode): 2.1 cm LEFT ATRIUM             Index       RIGHT ATRIUM           Index LA diam:        3.10 cm 1.62 cm/m  RA Area:     13.80 cm LA Vol (A2C):   77.3 ml 40.43 ml/m RA Volume:   31.90 ml  16.68 ml/m LA Vol (A4C):   44.7 ml 23.38 ml/m LA Biplane Vol: 61.6 ml 32.22 ml/m  AORTIC VALVE LVOT Vmax:   64.00 cm/s LVOT Vmean:  43.700 cm/s LVOT VTI:    0.132 m  AORTA Ao Root diam: 3.00 cm Ao Asc diam:  3.00 cm MITRAL VALVE                        TRICUSPID VALVE MV Area (PHT): 2.26 cm             TR Peak grad:   18.1 mmHg MV PHT:  97.15 msec           TR Vmax:        245.00 cm/s MV Decel Time: 335 msec MV E velocity: 62.10 cm/s 103 cm/s  SHUNTS MV A velocity: 84.80 cm/s 70.3 cm/s Systemic VTI:  0.13 m MV E/A ratio:  0.73       1.5       Systemic Diam: 2.20 cm  Eleonore Chiquito MD Electronically signed by Eleonore Chiquito MD Signature Date/Time: 11/07/2019/1:15:12 PM    Final     Patient Profile     LINVILLE BUCKERT is a 78 y.o. 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 Zigmund Daniel.  Assessment & Plan    1.  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  No further VT on tele. EF 40-45% down from normal EF last year.  Stable CAD on cath 12/16. No "culprit" lesion.  Not on AAD. He has previously tolerated amiodarone well for PVC suppression (weaned off s/p ablation) Have discussed risks and  benefits of ICD upgrade +/- LV lead with patient who verbalizes understanding and agrees to proceed. Will finalize plan with Dr. Lovena Le.   2. 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   3. CAD  Stable CAD on cath 11/07/2019   4. Paroxysmal atrial fibrillation Continue eliquis for CHA2DS2VASC of 5    5. Left fibular fracture Per IM.   For questions or updates, please contact [place] Please consult [email or web address] for contact info under Cardiology/STEMI.  Signed, Shirley Friar, PA-C  11/08/2019, 7:21 AM   EP Attending  Patient seen and examined. Agree with the findings as noted above. The patient is referred by Dr. Curt Bears for consideration of a biv ICD. He has CHB and an ischemic CM and has had a reduction in his EF. 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 With his VT, CHB, ICM, it is felt that upgrade from a DDD PPM to a Biv ICD is most appropriate. A DDD ICD will not treat his pacing induced LBBB/CHF. I have discussed the indications/risks/benefits/goals/expectations of ICD insertion and he wishes to proceed.  Mikle Bosworth.D.

## 2019-11-09 ENCOUNTER — Encounter: Payer: Self-pay | Admitting: *Deleted

## 2019-11-09 ENCOUNTER — Inpatient Hospital Stay (HOSPITAL_COMMUNITY): Payer: Medicare Other

## 2019-11-09 LAB — BASIC METABOLIC PANEL
Anion gap: 9 (ref 5–15)
BUN: 17 mg/dL (ref 8–23)
CO2: 21 mmol/L — ABNORMAL LOW (ref 22–32)
Calcium: 8.7 mg/dL — ABNORMAL LOW (ref 8.9–10.3)
Chloride: 108 mmol/L (ref 98–111)
Creatinine, Ser: 1.46 mg/dL — ABNORMAL HIGH (ref 0.61–1.24)
GFR calc Af Amer: 52 mL/min — ABNORMAL LOW (ref >60)
GFR calc non Af Amer: 45 mL/min — ABNORMAL LOW (ref >60)
Glucose, Bld: 107 mg/dL — ABNORMAL HIGH (ref 70–99)
Potassium: 4 mmol/L (ref 3.5–5.1)
Sodium: 138 mmol/L (ref 135–145)

## 2019-11-09 LAB — CBC WITH DIFFERENTIAL/PLATELET
Abs Immature Granulocytes: 0.03 10*3/uL (ref 0.00–0.07)
Basophils Absolute: 0 10*3/uL (ref 0.0–0.1)
Basophils Relative: 1 %
Eosinophils Absolute: 0.2 10*3/uL (ref 0.0–0.5)
Eosinophils Relative: 3 %
HCT: 41.4 % (ref 39.0–52.0)
Hemoglobin: 14.1 g/dL (ref 13.0–17.0)
Immature Granulocytes: 1 %
Lymphocytes Relative: 26 %
Lymphs Abs: 1.5 10*3/uL (ref 0.7–4.0)
MCH: 30.7 pg (ref 26.0–34.0)
MCHC: 34.1 g/dL (ref 30.0–36.0)
MCV: 90.2 fL (ref 80.0–100.0)
Monocytes Absolute: 0.7 10*3/uL (ref 0.1–1.0)
Monocytes Relative: 11 %
Neutro Abs: 3.4 10*3/uL (ref 1.7–7.7)
Neutrophils Relative %: 58 %
Platelets: 122 10*3/uL — ABNORMAL LOW (ref 150–400)
RBC: 4.59 MIL/uL (ref 4.22–5.81)
RDW: 12.4 % (ref 11.5–15.5)
WBC: 5.8 10*3/uL (ref 4.0–10.5)
nRBC: 0 % (ref 0.0–0.2)

## 2019-11-09 LAB — GLUCOSE, CAPILLARY: Glucose-Capillary: 100 mg/dL — ABNORMAL HIGH (ref 70–99)

## 2019-11-09 MED ORDER — METOPROLOL SUCCINATE ER 100 MG PO TB24
100.0000 mg | ORAL_TABLET | Freq: Every day | ORAL | Status: DC
  Administered 2019-11-09: 100 mg via ORAL
  Filled 2019-11-09: qty 1

## 2019-11-09 MED ORDER — APIXABAN 2.5 MG PO TABS: 2.5000 mg | ORAL_TABLET | Freq: Two times a day (BID) | ORAL | 6 refills | Status: DC

## 2019-11-09 MED ORDER — SPIRONOLACTONE 25 MG PO TABS: 12.5000 mg | ORAL_TABLET | Freq: Every day | ORAL | 8 refills | Status: DC

## 2019-11-09 MED ORDER — CEFAZOLIN SODIUM-DEXTROSE 1-4 GM/50ML-% IV SOLN
1.0000 g | Freq: Once | INTRAVENOUS | Status: AC
  Administered 2019-11-09: 1 g via INTRAVENOUS
  Filled 2019-11-09: qty 50

## 2019-11-09 MED ORDER — METOPROLOL SUCCINATE ER 100 MG PO TB24: 100.0000 mg | ORAL_TABLET | Freq: Every day | ORAL | 2 refills | Status: DC

## 2019-11-09 MED FILL — Gentamicin Sulfate Inj 40 MG/ML: INTRAMUSCULAR | Qty: 80 | Status: AC

## 2019-11-09 MED FILL — Cefazolin Sodium-Dextrose IV Solution 2 GM/100ML-4%: INTRAVENOUS | Qty: 100 | Status: AC

## 2019-11-09 MED FILL — Lidocaine HCl Local Inj 1%: INTRAMUSCULAR | Qty: 60 | Status: AC

## 2019-11-09 NOTE — Evaluation (Signed)
Physical Therapy Evaluation Patient Details Name: [name] MRN: [id number] DOB: [birth year] Today's Date: 11/09/2019   History of Present Illness  78 y.o. male with medical history significant for coronary artery disease, heart block with pacer, atrial fibrillation on Eliquis, chronic kidney disease stage III, nonischemic cardiomyopathy, and paroxysmal ventricular tachycardia, now presenting to the emergency department after syncopal episode with fall resulting in left fibular fracture and remote 5th metatarsal fracture. S/p Upgrade from a St Jude Dual chamber PPM to a CRT-D on 11/08/2019 in the setting of VT with syncope.  Clinical Impression  Pt admitted with above. Pt presents with decreased functional mobility secondary to left ankle/foot pain and decreased endurance. Ambulating 70 feet with walker at a supervision level with CAM boot donned. HR 77-87 bpm. Orthostatics negative (as seen below), although systolic BP did drop with transition from supine to sitting. Pt asymptomatic. Prior to admission, pt lives with his girlfriend and is independent with ADL's and ambulation. Does endorse baseline visual impairments, therefore, does not drive. Recommending HHPT to maximize functional independence upon discharge home.   Orthostatic Vital Signs:  Supine: 151/73 Sitting: 138/80 Standing: 130/80    Follow Up Recommendations Home health PT    Equipment Recommendations  None recommended by PT    Recommendations for Other Services       Precautions / Restrictions Precautions Precautions: Fall;ICD/Pacemaker Required Braces or Orthoses: Other Brace Other Brace: LLE CAM boot Restrictions Weight Bearing Restrictions: Yes LLE Weight Bearing: Weight bearing as tolerated      Mobility  Bed Mobility Overal bed mobility: Modified Independent                Transfers Overall transfer level: Modified independent Equipment used: Rolling walker (2 wheeled)                 Ambulation/Gait Ambulation/Gait assistance: Supervision Gait Distance (Feet): 70 Feet Assistive device: Rolling walker (2 wheeled) Gait Pattern/deviations: Step-through pattern;Antalgic;Decreased stance time - left;Step-to pattern Gait velocity: decreased Gait velocity interpretation: <1.8 ft/sec, indicate of risk for recurrent falls General Gait Details: Good sequencing technique, decreased LLE weightbearing and increased knee flexion in midstance. Cues for decreased left step length  Stairs            Wheelchair Mobility    Modified Rankin (Stroke Patients Only)       Balance Overall balance assessment: Needs assistance Sitting-balance support: Feet supported Sitting balance-Leahy Scale: Good     Standing balance support: Bilateral upper extremity supported;During functional activity Standing balance-Leahy Scale: Fair                               Pertinent Vitals/Pain Pain Assessment: Faces Faces Pain Scale: Hurts even more Pain Location: left ankle/foot Pain Descriptors / Indicators: Discomfort;Guarding;Grimacing Pain Intervention(s): Monitored during session;Limited activity within patient's tolerance    Home Living Family/patient expects to be discharged to:: Private residence Living Arrangements: Spouse/significant other(girlfriend) Available Help at Discharge: Friend(s);Available PRN/intermittently Type of Home: Apartment Home Access: Stairs to enter   Entrance Stairs-Number of Steps: 2 Home Layout: One level Home Equipment: Walker - 2 wheels;Cane - single point;Crutches      Prior Function Level of Independence: Independent         Comments: 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     Hand Dominance        Extremity/Trunk Assessment   Upper Extremity Assessment Upper Extremity Assessment: Overall WFL for tasks assessed  Lower Extremity Assessment Lower Extremity Assessment: Overall WFL for tasks assessed;LLE  deficits/detail LLE Deficits / Details: L fibular fx and 5th metatarsal fx       Communication   Communication: No difficulties  Cognition Arousal/Alertness: Awake/alert Behavior During Therapy: WFL for tasks assessed/performed Overall Cognitive Status: Within Functional Limits for tasks assessed                                        General Comments      Exercises     Assessment/Plan    PT Assessment Patient needs continued PT services  PT Problem List Decreased strength;Decreased activity tolerance;Decreased balance;Decreased mobility;Pain       PT Treatment Interventions DME instruction;Gait training;Stair training;Functional mobility training;Therapeutic activities;Therapeutic exercise;Balance training;Patient/family education    PT Goals (Current goals can be found in the Care Plan section)  Acute Rehab PT Goals Patient Stated Goal: "walk more, go to church." PT Goal Formulation: With patient Time For Goal Achievement: 11/23/19 Potential to Achieve Goals: Good    Frequency Min 3X/week   Barriers to discharge        Co-evaluation               AM-PAC PT "6 Clicks" Mobility  Outcome Measure Help needed turning from your back to your side while in a flat bed without using bedrails?: None Help needed moving from lying on your back to sitting on the side of a flat bed without using bedrails?: None Help needed moving to and from a bed to a chair (including a wheelchair)?: None Help needed standing up from a chair using your arms (e.g., wheelchair or bedside chair)?: None Help needed to walk in hospital room?: None Help needed climbing 3-5 steps with a railing? : A Little 6 Click Score: 23    End of Session Equipment Utilized During Treatment: Other (comment)(L CAM boot) Activity Tolerance: Patient tolerated treatment well Patient left: in chair;with call bell/phone within reach;with chair alarm set Nurse Communication: Mobility status PT  Visit Diagnosis: Other abnormalities of gait and mobility (R26.89);Pain;Difficulty in walking, not elsewhere classified (R26.2) Pain - Right/Left: Left Pain - part of body: Ankle and joints of foot    Time: [id number] PT Time Calculation (min) (ACUTE ONLY): 24 min   Charges:   PT Evaluation $PT Eval Moderate Complexity: 1 Mod PT Treatments $Gait Training: 8-22 mins        Ellamae Sia, PT, DPT Acute Rehabilitation Services Pager [phone] Office [phone]   Willy Eddy 11/09/2019, 3:11 PM

## 2019-11-09 NOTE — TOC Transition Note (Signed)
Transition of Care [facility]) - CM/SW Discharge Note   Patient Details  Name: [name] MRN: [id number] Date of Birth: 1941-04-26  Transition of [facility]) CM/SW Contact:  Zenon Mayo, RN Phone Number: 11/09/2019, 4:47 PM   Clinical Narrative:    NCM offered choice for HHPT, he states he has no preference for which angency.  NCM made referal to [facility] with Alvis Lemmings. Soc will begin 24- 48 hrs post dc.   Final next level of care: Home w Home Health Services Barriers to Discharge: No Barriers Identified   Patient Goals and CMS Choice   CMS Medicare.gov Compare Post Acute Care list provided to:: Patient Choice offered to / list presented to : Patient  Discharge Placement                       Discharge Plan and Services                DME Arranged: (NA)         HH Arranged: PT HH Agency: [facility] Date [facility] Agency Contacted: 11/09/19 Time [place]: [place] Representative spoke with at [place]: cory  Social Determinants of Health ([place]) Interventions     Readmission Risk Interventions No flowsheet data found.

## 2019-11-09 NOTE — Discharge Summary (Addendum)
Physician Discharge Summary  [name] [id number] DOB: September 08, 1941 DOA: 11/06/2019  PCP: Lucianne Lei, MD  Admit date: 11/06/2019 Discharge date: 11/09/2019  Admitted From: Home Disposition: Home  Recommendations for Outpatient Follow-up:  1. Follow up with PCP in 1-2 weeks 2. Please obtain BMP/CBC in one week 3. Please follow up with cardiology, EP, [facility] outpatient PT Equipment/Devices none Discharge Condition: Stable and improved CODE STATUS: Full code Diet recommendation: Cardiac Brief/Interim Summary:78 y.o.malewith medical history significant forcoronary artery disease, heart block with pacer, atrial fibrillation on Eliquis, chronic kidney disease stage III, nonischemic cardiomyopathy,and paroxysmal ventricular tachycardia, now presenting to the emergency department after syncopal episode. Patient reports that he been in his usual state of health, was having an uneventful day, and was walking through a parking lot when he developed sudden loss of consciousness. He reports having chest pain the day prior to this (11/05/2019), but did not experience any chest discomfort just prior to the episode. He is on certain of what happened but regained awareness with pain at the back of his head and left ankle. Patient denies any recent fevers, chills, cough, or shortness of breath. Denies any change in vision or hearing or focal numbness or weakness.  In 2012, he had a EF as low as 2025%, wore a LifeVest, but then had improvement in EF to 35 to 40%. Most recent echocardiogram in EMR from May 2019 featured EF 50 to 55%. In 2016, patient had episodes of nonsustained ventricular tachycardia during the stress test.  ED Course:Upon arrival to the ED, patient is found to be afebrile, saturating well on room air, and with stable blood pressure. EKG features a paced rhythm with PVCs. Chest x-ray is negative for acute findings. Radiographs of the left ankle demonstrate  mildly comminuted distal fibula fracture. Chemistry panel notable for creatinine 1.69, similar to priors. Noncontrast head CT negative for acute intracranial abnormality. CBC unremarkable. Troponin elevated to 27, then further elevated to 75. Urinalysis is nitrite positive. Urine was sent for culture in the ED, empiric Rocephin was started, COVID-19 screening test is in process, cardiology was consulted by the ED physician, and medical admission was recommended.  Discharge Diagnoses:  Active Problems:   Chronic diastolic CHF (congestive heart failure) (HCC)   CKD (chronic kidney disease) stage 3, GFR 30-59 ml/min   Ventricular tachycardia (paroxysmal) (HCC)   Acute lower UTI   Paroxysmal atrial fibrillation (HCC)   Cardiac syncope   Closed fracture of distal fibula   ICD (implantable cardioverter-defibrillator) in place  #1 syncope secondary to V. Tach-patient had cath 11/07/2019-no culprit lesion per cardiology.  He is status post upgrade to [facility] CRT-D.  This was done to treat his pacing induced left bundle branch block and CHF.Echo 40 to 45% ejection fraction. His discharge medications are Toprol 100 mg daily, Aldactone 12.5 mg daily, continue Imdur, continue Lasix, DC amlodipine.  I have not started him on any hydralazine as his blood pressure is soft this may need to be started as an outpatient.  #2 paroxysmal atrial fibrillation rate controlled on Toprol.  Restart Eliquis 11/11/2019 evening.  #3 left fibular fracture in boots-this is from the syncopal episode he had prior to admission.  Mildly commuted distal left fibula fracture on plain films.  He will need outpatient Ortho follow-up on discharge.  Follow-up with [facility] call for appointment.  #4 CKD stage III stable creatinine 1.46 down from 1.70compared to yesterday.  #5 question UTI  since the UA appeared dirty  he was started on Rocephin.  Urine culture no growth.  DC Rocephin.  #6 hyperlipidemia continue  statin.  #7 patient seen by physical therapy recommends home health PT.  He needs home health PT due to unsafe ambulation due to balance issues for gait training transfer training and stair training.  He is unable to leave home without assistance.   Estimated body mass index is 25.11 kg/m as calculated from the following:   Height as of this encounter: 5\' 8"  (1.727 m).   Weight as of this encounter: 74.9 kg.  Discharge Instructions  Discharge Instructions    Avoid straining   Complete by: As directed    Call MD for:  difficulty breathing, headache or visual disturbances   Complete by: As directed    Call MD for:  persistant nausea and vomiting   Complete by: As directed    Call MD for:  severe uncontrolled pain   Complete by: As directed    Call MD for:  temperature >100.4   Complete by: As directed    Diet - low sodium heart healthy   Complete by: As directed    Heart Failure patients record your daily weight using the same scale at the same time of day   Complete by: As directed    Increase activity slowly   Complete by: As directed    STOP any activity that causes chest pain, shortness of breath, dizziness, sweating, or exessive weakness   Complete by: As directed      Allergies as of 11/09/2019   No Known Allergies     Medication List    STOP taking these medications   amLODipine 10 MG tablet Commonly known as: NORVASC     TAKE these medications   albuterol 108 (90 Base) MCG/ACT inhaler Commonly known as: VENTOLIN HFA Inhale 1 puff into the lungs every 6 (six) hours as needed for wheezing or shortness of breath.   apixaban 2.5 MG Tabs tablet Commonly known as: Eliquis Take 1 tablet (2.5 mg total) by mouth 2 (two) times daily. Resume Sunday with the EVENING dose. Start taking on: November 11, 2019 What changed:   additional instructions  These instructions start on November 11, 2019. If you are unsure what to do until then, ask your doctor or other care  provider.   aspirin EC 81 MG tablet Take 81 mg by mouth daily.   Breo Ellipta 100-25 MCG/INH Aepb Generic drug: fluticasone furoate-vilanterol Inhale 1 puff into the lungs daily.   dorzolamide-timolol 22.3-6.8 MG/ML ophthalmic solution Commonly known as: COSOPT Place 1 drop into both eyes daily.   Fluad 0.5 ML Susy Generic drug: Influenza Vac A&B Surf Ant Adj ADMINISTER 0.5ML IN THE MUSCLE AS DIRECTED   furosemide 20 MG tablet Commonly known as: LASIX TAKE 1 TABLET(20 MG) BY MOUTH DAILY What changed: See the new instructions.   Geritol Liqd Take 10 mLs by mouth daily.   isosorbide mononitrate 30 MG 24 hr tablet Commonly known as: IMDUR TAKE 1 TABLET(30 MG) BY MOUTH DAILY What changed:   See the new instructions.  Another medication with the same name was removed. Continue taking this medication, and follow the directions you see here.   Lumigan 0.01 % Soln Generic drug: bimatoprost Place 1 drop into both eyes at bedtime.   metoprolol succinate 100 MG 24 hr tablet Commonly known as: TOPROL-XL Take 1 tablet (100 mg total) by mouth daily. Take with or immediately following a meal. Start taking on: November 10, 2019  What changed:   medication strength  how much to take  additional instructions   nitroGLYCERIN 0.4 MG SL tablet Commonly known as: NITROSTAT Place 1 tablet (0.4 mg total) under the tongue every 5 (five) minutes as needed for chest pain.   pravastatin 40 MG tablet Commonly known as: PRAVACHOL Take 40 mg by mouth daily.   Restasis 0.05 % ophthalmic emulsion Generic drug: cycloSPORINE Place 1 drop into both eyes daily.   spironolactone 25 MG tablet Commonly known as: ALDACTONE Take 0.5 tablets (12.5 mg total) by mouth daily. What changed: when to take this      Follow-up Information    Evans Lance, MD Follow up on 02/20/2020.   Specialty: Cardiology Why: at 330 pm for 3 day post defib upgrade follow up Contact information: [address] [place] [address] [phone]        [facility] Follow up on 11/20/2019.   Why: at 1000 am for post defibrillator wound check Contact information: [place] [phone]       [place] Follow up.   Contact information: [facility] [id number] [phone]         No Known Allergies  Consultations: Cardiology, EP  Procedures/Studies: DG Chest 2 View  Result Date: 11/09/2019 CLINICAL DATA:  ICD in place. EXAM: CHEST - 2 VIEW COMPARISON:  Radiograph 11/06/2019 FINDINGS: New battery pack for left-sided pacemaker. New lead projecting over the cardiac apex. Additional leads projecting over the right atrium and ventricle are unchanged. No pneumothorax. Heart is normal in size. Normal mediastinal contours. No pulmonary edema or focal airspace disease. No pleural fluid. No acute osseous abnormalities. Anchors in the left proximal humerus from prior rotator cuff repair. IMPRESSION: Left-sided pacemaker with the lead projecting over the cardiac apex. No pneumothorax. Electronically Signed   By: Keith Rake M.D.   On: 11/09/2019 05:47   DG Chest 2 View  Result Date: 11/06/2019 CLINICAL DATA:  Onset chest pain yesterday. EXAM: CHEST - 2 VIEW COMPARISON:  PA and lateral chest 07/13/2018 FINDINGS: Lungs appear emphysematous but are clear. Heart size is upper normal. No pneumothorax or pleural fluid. No acute or focal bony abnormality. Pacing device and spinal fusion hardware unchanged. IMPRESSION: No acute disease. Lungs appear emphysematous. Electronically Signed   By: Inge Rise M.D.   On: 11/06/2019 16:34   CT HEAD WO CONTRAST  Result Date: 11/06/2019 CLINICAL DATA:  Headache and dizziness EXAM: CT HEAD WITHOUT CONTRAST TECHNIQUE: Contiguous axial images were obtained from the base of the skull through the vertex without  intravenous contrast. COMPARISON:  November 17, 2016 FINDINGS: Brain: No evidence of acute territorial infarction, hemorrhage, hydrocephalus,extra-axial collection or mass lesion/mass effect. There is dilatation the ventricles and sulci consistent with age-related atrophy. Low-attenuation changes in the deep white matter consistent with small vessel ischemia. Vascular: No hyperdense vessel or unexpected calcification. Skull: The skull is intact. No fracture or focal lesion identified. Sinuses/Orbits: The visualized paranasal sinuses and mastoid air cells are clear. The orbits and globes intact. Other: None IMPRESSION: No acute intracranial abnormality. Findings consistent with age related atrophy and chronic small vessel ischemia Electronically Signed   By: Prudencio Pair M.D.   On: 11/06/2019 16:57   CARDIAC CATHETERIZATION  Result Date: 11/07/2019  Prox LAD to Mid LAD lesion is 30% stenosed.  1st Diag lesion is 80% stenosed.  1.  Stable 80% stenosis in diagonal branch which was described  on previous cardiac catheterization in 2009.  No other obstructive disease.  2.  Normal left ventricular end-diastolic pressure.  Left ventricular angiography was not performed due to chronic kidney disease. Recommendations: No culprit is identified for ventricular tachycardia. Proceed with EP management as planned.   EP PPM/ICD IMPLANT  Result Date: 11/08/2019 Conclusion: Successful upgrade of a dual-chamber pacemaker to a biventricular ICD in a patient with sustained monomorphic ventricular tachycardia, syncope, and ejection fraction of 40% with pacing induced left bundle branch block, QRS duration 180 ms. Crissie Sickles, MD  DG Ankle Left Port  Result Date: 11/07/2019 CLINICAL DATA:  Syncopal episode in the parking lot leading to fall. Left foot and ankle pain. EXAM: PORTABLE LEFT ANKLE - 2 VIEW COMPARISON:  None. FINDINGS: Oblique mildly comminuted distal fibular fracture just proximal to the ankle mortise. No  additional fracture. No mortise widening. Anterolateral soft tissue edema but no definite joint effusion. Small plantar calcaneal spur. IMPRESSION: Oblique mildly comminuted distal fibular fracture just proximal to the ankle mortise. Associated soft tissue edema. Electronically Signed   By: Keith Rake M.D.   On: 11/07/2019 04:22   DG Foot Complete Left  Result Date: 11/07/2019 CLINICAL DATA:  Syncopal episode in the parking lot leading to fall. Left foot and ankle pain. EXAM: LEFT FOOT - COMPLETE 3+ VIEW COMPARISON:  None. FINDINGS: Distal fibular fracture assessed on concurrent ankle exam. Remote fracture of the fifth metatarsal. No additional acute fracture of the foot. Mild hallux valgus and degenerative change of the first metatarsal phalangeal joint. Small plantar calcaneal spur. Anterolateral soft tissue edema. IMPRESSION: 1. No acute fracture of the foot. Distal fibular fracture assessed on concurrent ankle exam. 2. Remote fifth metatarsal fracture. Electronically Signed   By: Keith Rake M.D.   On: 11/07/2019 04:23   ECHOCARDIOGRAM COMPLETE  Result Date: 11/07/2019   ECHOCARDIOGRAM REPORT   Patient Name:   [name] Date of Exam: 11/07/2019 Medical Rec #:  [id number]        Height:       68.0 in Accession #:    [id number]       Weight:       171.0 lb Date of Birth:  [birth year]         BSA:          1.91 m Patient Age:    38 years         BP:           140/81 mmHg Patient Gender: M                HR:           67 bpm. Exam Location:  Inpatient Procedure: 2D Echo Indications:    Syncope 780.2 / R55                 Ventricular Tachycardia I47.2  History:        Patient has prior history of Echocardiogram examinations, most                 recent 04/13/2018. CHF, CAD, Pacemaker; Risk                 Factors:Hypertension. PAD, chronic kidney disease.  Sonographer:    Darlina Sicilian RDCS Referring Phys: [id number] [place]  1. LVEF mildly decreased 40-45%. Hypokinesis of the  apical septum/apex. Frequent PVCs which could be coming from this region, cannot exclude ischemia. EF lower compared with 04/13/2018 with new wall motion  abnormality.  2. Left ventricular ejection fraction, by visual estimation, is 40 to 45%. The left ventricle has mildly decreased function. There is mildly increased left ventricular hypertrophy.  3. Apical septal segment, apical anterior segment, and apex are abnormal.  4. Abnormal septal motion consistent with RV pacemaker.  5. Left ventricular diastolic parameters are consistent with Grade I diastolic dysfunction (impaired relaxation).  6. The left ventricle demonstrates regional wall motion abnormalities.  7. Global right ventricle has normal systolic function.The right ventricular size is normal. No increase in right ventricular wall thickness.  8. Left atrial size was normal.  9. Right atrial size was normal. 10. Presence of pericardial fat pad. 11. The mitral valve is grossly normal. Trivial mitral valve regurgitation. 12. The tricuspid valve is grossly normal. Tricuspid valve regurgitation is trivial. 13. The aortic valve is tricuspid. Aortic valve regurgitation is not visualized. Mild aortic valve sclerosis without stenosis. 14. The pulmonic valve was grossly normal. Pulmonic valve regurgitation is not visualized. 15. Mild plaque invoving the ascending aorta. 16. Normal pulmonary artery systolic pressure. 17. The tricuspid regurgitant velocity is 2.13 m/s, and with an assumed right atrial pressure of 3 mmHg, the estimated right ventricular systolic pressure is normal at 21.1 mmHg. 18. A pacer wire is visualized in the RA and RV. 19. The inferior vena cava is normal in size with greater than 50% respiratory variability, suggesting right atrial pressure of 3 mmHg. FINDINGS  Left Ventricle: Left ventricular ejection fraction, by visual estimation, is 40 to 45%. The left ventricle has mildly decreased function. The left ventricle demonstrates regional wall  motion abnormalities. There is mildly increased left ventricular hypertrophy. Concentric left ventricular hypertrophy. Abnormal (paradoxical) septal motion, consistent with RV pacemaker. Left ventricular diastolic parameters are consistent with Grade I diastolic dysfunction (impaired relaxation). Normal left atrial pressure. LVEF mildly decreased 40-45%. Hypokinesis of the apical septum/apex. Frequent PVCs which could be coming from this region, cannot exclude ischemia. EF lower compared with 04/13/2018 with new wall motion abnormality.  LV Wall Scoring: The apical septal segment, apical anterior segment, and apex are hypokinetic. Right Ventricle: The right ventricular size is normal. No increase in right ventricular wall thickness. Global RV systolic function is has normal systolic function. The tricuspid regurgitant velocity is 2.13 m/s, and with an assumed right atrial pressure  of 3 mmHg, the estimated right ventricular systolic pressure is normal at 21.1 mmHg. Left Atrium: Left atrial size was normal in size. Right Atrium: Right atrial size was normal in size Pericardium: There is no evidence of pericardial effusion. Presence of pericardial fat pad. Mitral Valve: The mitral valve is grossly normal. Trivial mitral valve regurgitation. Tricuspid Valve: The tricuspid valve is grossly normal. Tricuspid valve regurgitation is trivial. Aortic Valve: The aortic valve is tricuspid. Aortic valve regurgitation is not visualized. Mild aortic valve sclerosis is present, with no evidence of aortic valve stenosis. Pulmonic Valve: The pulmonic valve was grossly normal. Pulmonic valve regurgitation is not visualized. Pulmonic regurgitation is not visualized. Aorta: The aortic root and ascending aorta are structurally normal, with no evidence of dilitation. There is mild, protruding plaque involving the ascending aorta. Venous: The inferior vena cava is normal in size with greater than 50% respiratory variability, suggesting  right atrial pressure of 3 mmHg. IAS/Shunts: No atrial level shunt detected by color flow Doppler. Additional Comments: A pacer wire is visualized in the right atrium and right ventricle.  LEFT VENTRICLE PLAX 2D LVIDd:         4.89 cm  Diastology LVIDs:         3.21 cm       LV e' lateral:   5.22 cm/s LV PW:         1.10 cm       LV E/e' lateral: 11.9 LV IVS:        1.29 cm       LV e' medial:    4.35 cm/s LVOT diam:     2.20 cm       LV E/e' medial:  14.3 LV SV:         71 ml LV SV Index:   36.58 LVOT Area:     3.80 cm  LV Volumes (MOD) LV area d, A2C:    41.30 cm LV area d, A4C:    36.70 cm LV area s, A2C:    30.80 cm LV area s, A4C:    29.80 cm LV major d, A2C:   9.07 cm LV major d, A4C:   8.60 cm LV major s, A2C:   8.30 cm LV major s, A4C:   8.01 cm LV vol d, MOD A2C: 155.0 ml LV vol d, MOD A4C: 129.0 ml LV vol s, MOD A2C: 95.4 ml LV vol s, MOD A4C: 90.3 ml LV SV MOD A2C:     59.6 ml LV SV MOD A4C:     129.0 ml LV SV MOD BP:      50.9 ml RIGHT VENTRICLE RV S prime:     14.80 cm/s TAPSE (M-mode): 2.1 cm LEFT ATRIUM             Index       RIGHT ATRIUM           Index LA diam:        3.10 cm 1.62 cm/m  RA Area:     13.80 cm LA Vol (A2C):   77.3 ml 40.43 ml/m RA Volume:   31.90 ml  16.68 ml/m LA Vol (A4C):   44.7 ml 23.38 ml/m LA Biplane Vol: 61.6 ml 32.22 ml/m  AORTIC VALVE LVOT Vmax:   64.00 cm/s LVOT Vmean:  43.700 cm/s LVOT VTI:    0.132 m  AORTA Ao Root diam: 3.00 cm Ao Asc diam:  3.00 cm MITRAL VALVE                        TRICUSPID VALVE MV Area (PHT): 2.26 cm             TR Peak grad:   18.1 mmHg MV PHT:        97.15 msec           TR Vmax:        245.00 cm/s MV Decel Time: 335 msec MV E velocity: 62.10 cm/s 103 cm/s  SHUNTS MV A velocity: 84.80 cm/s 70.3 cm/s Systemic VTI:  0.13 m MV E/A ratio:  0.73       1.5       Systemic Diam: 2.20 cm  Eleonore Chiquito MD Electronically signed by Eleonore Chiquito MD Signature Date/Time: 11/07/2019/1:15:12 PM    Final     (Echo, Carotid, EGD, Colonoscopy,  ERCP)    Subjective:  Patient resting in bed no further chest pain shortness of breath worked with physical therapy recommends home PT anxious to go home Discharge Exam: Vitals:   11/09/19 0739 11/09/19 1126  BP: 120/70 130/78  Pulse: 75 73  Resp: 17 19  Temp: 98.7 F (37.1 C)  98.3 F (36.8 C)  SpO2: 96% 100%   Vitals:   11/09/19 0011 11/09/19 0451 11/09/19 0739 11/09/19 1126  BP: 125/73 136/62 120/70 130/78  Pulse: (!) 58 75 75 73  Resp: 18 16 17 19   Temp: 98.3 F (36.8 C) 98.8 F (37.1 C) 98.7 F (37.1 C) 98.3 F (36.8 C)  TempSrc: Oral Oral Oral Oral  SpO2: 96% 94% 96% 100%  Weight:  74.9 kg    Height:        General: Pt is alert, awake, not in acute distress Cardiovascular: RRR, S1/S2 +, no rubs, no gallops Respiratory: CTA bilaterally, no wheezing, no rhonchi, left upper chest pacer site clean dry intact. Abdominal: Soft, NT, ND, bowel sounds + Extremities: no edema, no cyanosis    The results of significant diagnostics from this hospitalization (including imaging, microbiology, ancillary and laboratory) are listed below for reference.     Microbiology: Recent Results (from the past 240 hour(s))  Urine culture     Status: Abnormal   Collection Time: 11/06/19  8:17 AM   Specimen: Urine, Random  Result Value Ref Range Status   Specimen Description URINE, RANDOM  Final   Special Requests   Final    NONE Performed at [facility], [address], [place], [place] [address]    Culture MULTIPLE SPECIES PRESENT, SUGGEST RECOLLECTION (A)  Final   Report Status 11/08/2019 FINAL  Final  SARS CORONAVIRUS 2 (TAT 6-24 HRS) Nasopharyngeal Nasopharyngeal Swab     Status: None   Collection Time: 11/07/19  4:27 AM   Specimen: Nasopharyngeal Swab  Result Value Ref Range Status   SARS Coronavirus 2 NEGATIVE NEGATIVE Final    Comment: (NOTE) SARS-CoV-2 target nucleic acids are NOT DETECTED. The SARS-CoV-2 RNA is generally detectable in upper and  lower respiratory specimens during the acute phase of infection. Negative results do not preclude SARS-CoV-2 infection, do not rule out co-infections with other pathogens, and should not be used as the sole basis for treatment or other patient management decisions. Negative results must be combined with clinical observations, patient history, and epidemiological information. The expected result is Negative. Fact Sheet for Patients: SugarRoll.be Fact Sheet for Healthcare Providers: https://www.woods-[employer]en.com/ This test is not yet approved or cleared by the [place] FDA and  has been authorized for detection and/or diagnosis of SARS-CoV-2 by FDA under an Emergency Use Authorization (EUA). This EUA will remain  in effect (meaning this test can be used) for the duration of the COVID-19 declaration under Section 56 4(b)(1) of the Act, 21 U.S.C. section 360bbb-3(b)(1), unless the authorization is terminated or revoked sooner. Performed at [facility], [place] [address], [place], [place] [address]   Surgical PCR screen     Status: None   Collection Time: 11/08/19  8:15 AM   Specimen: Nasal Mucosa; Nasal Swab  Result Value Ref Range Status   MRSA, PCR NEGATIVE NEGATIVE Final   Staphylococcus aureus NEGATIVE NEGATIVE Final    Comment: (NOTE) The Xpert SA Assay (FDA approved for NASAL specimens in patients 64 years of age and older), is one component of a comprehensive surveillance program. It is not intended to diagnose infection nor to guide or monitor treatment. Performed at [facility], [place] [address] [place], [place] [address]      Labs: BNP (last 3 results) No results for input(s): BNP in the last 8760 hours. Basic Metabolic Panel: Recent Labs  Lab 11/06/19 1552 11/08/19 0435 11/09/19 0449  NA 141  142 138  K 4.4 4.1 4.0  CL 107 108 108  CO2 25 24 21*  GLUCOSE 118* 100* 107*  BUN 15 19 17   CREATININE 1.69*  1.70* 1.46*  CALCIUM 9.5 9.1 8.7*   Liver Function Tests: Recent Labs  Lab 11/08/19 0435  AST 23  ALT 19  ALKPHOS 54  BILITOT 1.3*  PROT 6.9  ALBUMIN 3.3*   No results for input(s): LIPASE, AMYLASE in the last 168 hours. No results for input(s): AMMONIA in the last 168 hours. CBC: Recent Labs  Lab 11/06/19 1552 11/08/19 0435 11/09/19 0449  WBC 5.7 5.9 5.8  NEUTROABS  --  2.5 3.4  HGB 15.1 14.7 14.1  HCT 44.0 43.9 41.4  MCV 90.5 91.3 90.2  PLT 158 137* 122*   Cardiac Enzymes: No results for input(s): CKTOTAL, CKMB, CKMBINDEX, TROPONINI in the last 168 hours. BNP: Invalid input(s): POCBNP CBG: Recent Labs  Lab 11/06/19 1551 11/08/19 0741 11/09/19 0638  GLUCAP 96 92 100*   D-Dimer No results for input(s): DDIMER in the last 72 hours. Hgb A1c No results for input(s): HGBA1C in the last 72 hours. Lipid Profile No results for input(s): CHOL, HDL, LDLCALC, TRIG, CHOLHDL, LDLDIRECT in the last 72 hours. Thyroid function studies No results for input(s): TSH, T4TOTAL, T3FREE, THYROIDAB in the last 72 hours.  Invalid input(s): FREET3 Anemia work up No results for input(s): VITAMINB12, FOLATE, FERRITIN, TIBC, IRON, RETICCTPCT in the last 72 hours. Urinalysis    Component Value Date/Time   COLORURINE YELLOW 11/06/2019 1701   APPEARANCEUR HAZY (A) 11/06/2019 1701   LABSPEC 1.015 11/06/2019 1701   PHURINE 6.0 11/06/2019 1701   GLUCOSEU NEGATIVE 11/06/2019 1701   HGBUR NEGATIVE 11/06/2019 1701   BILIRUBINUR NEGATIVE 11/06/2019 1701   KETONESUR NEGATIVE 11/06/2019 1701   PROTEINUR 100 (A) 11/06/2019 1701   UROBILINOGEN 0.2 11/22/2011 0247   NITRITE POSITIVE (A) 11/06/2019 1701   LEUKOCYTESUR SMALL (A) 11/06/2019 1701   Sepsis Labs Invalid input(s): PROCALCITONIN,  WBC,  LACTICIDVEN Microbiology Recent Results (from the past 240 hour(s))  Urine culture     Status: Abnormal   Collection Time: 11/06/19  8:17 AM   Specimen: Urine, Random  Result Value Ref Range  Status   Specimen Description URINE, RANDOM  Final   Special Requests   Final    NONE Performed at [facility], [place] [address] [place], [place] [address]    Culture MULTIPLE SPECIES PRESENT, SUGGEST RECOLLECTION (A)  Final   Report Status 11/08/2019 FINAL  Final  SARS CORONAVIRUS 2 (TAT 6-24 HRS) Nasopharyngeal Nasopharyngeal Swab     Status: None   Collection Time: 11/07/19  4:27 AM   Specimen: Nasopharyngeal Swab  Result Value Ref Range Status   SARS Coronavirus 2 NEGATIVE NEGATIVE Final    Comment: (NOTE) SARS-CoV-2 target nucleic acids are NOT DETECTED. The SARS-CoV-2 RNA is generally detectable in upper and lower respiratory specimens during the acute phase of infection. Negative results do not preclude SARS-CoV-2 infection, do not rule out co-infections with other pathogens, and should not be used as the sole basis for treatment or other patient management decisions. Negative results must be combined with clinical observations, patient history, and epidemiological information. The expected result is Negative. Fact Sheet for Patients: SugarRoll.be Fact Sheet for Healthcare Providers: https://www.woods-[employer].com/ This test is not yet approved or cleared by the [place] FDA and  has been authorized for detection and/or diagnosis of SARS-CoV-2 by FDA under an Emergency Use Authorization (EUA). This EUA  will remain  in effect (meaning this test can be used) for the duration of the COVID-19 declaration under Section 56 4(b)(1) of the Act, 21 U.S.C. section 360bbb-3(b)(1), unless the authorization is terminated or revoked sooner. Performed at [facility], [place] [address], [place], [place] [address]   Surgical PCR screen     Status: None   Collection Time: 11/08/19  8:15 AM   Specimen: Nasal Mucosa; Nasal Swab  Result Value Ref Range Status   MRSA, PCR NEGATIVE NEGATIVE Final   Staphylococcus aureus NEGATIVE NEGATIVE  Final    Comment: (NOTE) The Xpert SA Assay (FDA approved for NASAL specimens in patients 51 years of age and older), is one component of a comprehensive surveillance program. It is not intended to diagnose infection nor to guide or monitor treatment. Performed at [facility], [place] [address], [place], [place] [address]      Time coordinating discharge: 44 minutes  SIGNED:   Georgette Shell, MD  [facility] 11/09/2019, 4:15 PM Pager   If 7PM-7AM, please contact night-coverage [email or web address] Password [email or web address]

## 2019-11-09 NOTE — Progress Notes (Addendum)
Patient became dizzy after standing for a little bit.  Physical therapy has been ordered.  MD made aware

## 2019-11-09 NOTE — Progress Notes (Addendum)
Electrophysiology Rounding Note  Patient Name: [name] Date of Encounter: 11/09/2019  Primary Cardiologist: Shelva Majestic, MD Electrophysiologist: Sanda Klein, MD -> Dr. Lovena Le   Subjective   S/p Upgrade from a St Jude Dual chamber PPM to a CRT-D on 11/08/2019 in the setting of VT with syncope  CXR this am stable without Ptx. Device function stable per interrogation. Pt with frequent PVCs, and LRL increase to 70 with moderate suppression.   The patient is doing well today. Mildly sore.  At this time, the patient denies chest pain, shortness of breath, or any new concerns.  Inpatient Medications    Scheduled Meds:  cycloSPORINE  1 drop Both Eyes Daily   metoprolol succinate  100 mg Oral Daily   pravastatin  40 mg Oral Daily   sodium chloride flush  3 mL Intravenous Q12H   Continuous Infusions:  sodium chloride     PRN Meds: sodium chloride, acetaminophen **OR** acetaminophen, HYDROcodone-acetaminophen, morphine injection, ondansetron **OR** ondansetron (ZOFRAN) IV, polyethylene glycol, sodium chloride flush   Vital Signs    Vitals:   11/08/19 2022 11/09/19 0011 11/09/19 0451 11/09/19 0739  BP: 130/73 125/73 136/62 120/70  Pulse: 69 (!) 58 75 75  Resp:  18 16 17   Temp: 98 F (36.7 C) 98.3 F (36.8 C) 98.8 F (37.1 C) 98.7 F (37.1 C)  TempSrc: Oral Oral Oral Oral  SpO2: 96% 96% 94% 96%  Weight:   74.9 kg   Height:        Intake/Output Summary (Last 24 hours) at 11/09/2019 0801 Last data filed at 11/09/2019 0629 Gross per 24 hour  Intake 740 ml  Output 925 ml  Net -185 ml   Filed Weights   11/06/19 1539 11/08/19 0519 11/09/19 0451  Weight: 77.6 kg 75.2 kg 74.9 kg    Physical Exam    GEN- The patient is well appearing, alert and oriented x 3 today.   Head- normocephalic, atraumatic Eyes-  Sclera clear, conjunctiva pink Ears- hearing intact Oropharynx- clear Neck- supple Lungs- Clear to ausculation bilaterally, normal work of  breathing Heart- Regular rate and rhythm, no murmurs, rubs or gallops. Left chest site stable. No hematoma. GI- soft, NT, ND, + BS Extremities- no clubbing, cyanosis, or edema Skin- no rash or lesion Psych- euthymic mood, full affect Neuro- strength and sensation are intact  Labs    CBC Recent Labs    11/08/19 0435 11/09/19 0449  WBC 5.9 5.8  NEUTROABS 2.5 3.4  HGB 14.7 14.1  HCT 43.9 41.4  MCV 91.3 90.2  PLT 137* [id number]*   Basic Metabolic Panel Recent Labs    11/08/19 0435 11/09/19 0449  NA 142 138  K 4.1 4.0  CL 108 108  CO2 24 21*  GLUCOSE 100* 107*  BUN 19 17  CREATININE 1.70* 1.46*  CALCIUM 9.1 8.7*   Liver Function Tests Recent Labs    11/08/19 0435  AST 23  ALT 19  ALKPHOS 54  BILITOT 1.3*  PROT 6.9  ALBUMIN 3.3*   No results for input(s): LIPASE, AMYLASE in the last 72 hours. Cardiac Enzymes No results for input(s): CKTOTAL, CKMB, CKMBINDEX, TROPONINI in the last 72 hours.   Telemetry    V pacing 60-70s (personally reviewed)  Radiology    DG Chest 2 View  Result Date: 11/09/2019 CLINICAL DATA:  ICD in place. EXAM: CHEST - 2 VIEW COMPARISON:  Radiograph 11/06/2019 FINDINGS: New battery pack for left-sided pacemaker. New lead projecting over the cardiac apex. Additional leads  projecting over the right atrium and ventricle are unchanged. No pneumothorax. Heart is normal in size. Normal mediastinal contours. No pulmonary edema or focal airspace disease. No pleural fluid. No acute osseous abnormalities. Anchors in the left proximal humerus from prior rotator cuff repair. IMPRESSION: Left-sided pacemaker with the lead projecting over the cardiac apex. No pneumothorax. Electronically Signed   By: Keith Rake M.D.   On: 11/09/2019 05:47   CARDIAC CATHETERIZATION  Result Date: 11/07/2019  Prox LAD to Mid LAD lesion is 30% stenosed.  1st Diag lesion is 80% stenosed.  1.  Stable 80% stenosis in diagonal branch which was described on previous cardiac  catheterization in 2009.  No other obstructive disease.  2.  Normal left ventricular end-diastolic pressure.  Left ventricular angiography was not performed due to chronic kidney disease. Recommendations: No culprit is identified for ventricular tachycardia. Proceed with EP management as planned.   EP PPM/ICD IMPLANT  Result Date: 11/08/2019 Conclusion: Successful upgrade of a dual-chamber pacemaker to a biventricular ICD in a patient with sustained monomorphic ventricular tachycardia, syncope, and ejection fraction of 40% with pacing induced left bundle branch block, QRS duration 180 ms. Crissie Sickles, MD  ECHOCARDIOGRAM COMPLETE  Result Date: 11/07/2019   ECHOCARDIOGRAM REPORT   Patient Name:   [name] Date of Exam: 11/07/2019 Medical Rec #:  [id number]        Height:       68.0 in Accession #:    [id number]       Weight:       171.0 lb Date of Birth:  [birth year]         BSA:          1.91 m Patient Age:    78 years         BP:           140/81 mmHg Patient Gender: M                HR:           67 bpm. Exam Location:  Inpatient Procedure: 2D Echo Indications:    Syncope 780.2 / R55                 Ventricular Tachycardia I47.2  History:        Patient has prior history of Echocardiogram examinations, most                 recent 04/13/2018. CHF, CAD, Pacemaker; Risk                 Factors:Hypertension. PAD, chronic kidney disease.  Sonographer:    Darlina Sicilian RDCS Referring Phys: [id number] [place]  1. LVEF mildly decreased 40-45%. Hypokinesis of the apical septum/apex. Frequent PVCs which could be coming from this region, cannot exclude ischemia. EF lower compared with 04/13/2018 with new wall motion abnormality.  2. Left ventricular ejection fraction, by visual estimation, is 40 to 45%. The left ventricle has mildly decreased function. There is mildly increased left ventricular hypertrophy.  3. Apical septal segment, apical anterior segment, and apex are abnormal.  4. Abnormal  septal motion consistent with RV pacemaker.  5. Left ventricular diastolic parameters are consistent with Grade I diastolic dysfunction (impaired relaxation).  6. The left ventricle demonstrates regional wall motion abnormalities.  7. Global right ventricle has normal systolic function.The right ventricular size is normal. No increase in right ventricular wall thickness.  8. Left atrial size was normal.  9. Right atrial  size was normal. 10. Presence of pericardial fat pad. 11. The mitral valve is grossly normal. Trivial mitral valve regurgitation. 12. The tricuspid valve is grossly normal. Tricuspid valve regurgitation is trivial. 13. The aortic valve is tricuspid. Aortic valve regurgitation is not visualized. Mild aortic valve sclerosis without stenosis. 14. The pulmonic valve was grossly normal. Pulmonic valve regurgitation is not visualized. 15. Mild plaque invoving the ascending aorta. 16. Normal pulmonary artery systolic pressure. 17. The tricuspid regurgitant velocity is 2.13 m/s, and with an assumed right atrial pressure of 3 mmHg, the estimated right ventricular systolic pressure is normal at 21.1 mmHg. 18. A pacer wire is visualized in the RA and RV. 19. The inferior vena cava is normal in size with greater than 50% respiratory variability, suggesting right atrial pressure of 3 mmHg. FINDINGS  Left Ventricle: Left ventricular ejection fraction, by visual estimation, is 40 to 45%. The left ventricle has mildly decreased function. The left ventricle demonstrates regional wall motion abnormalities. There is mildly increased left ventricular hypertrophy. Concentric left ventricular hypertrophy. Abnormal (paradoxical) septal motion, consistent with RV pacemaker. Left ventricular diastolic parameters are consistent with Grade I diastolic dysfunction (impaired relaxation). Normal left atrial pressure. LVEF mildly decreased 40-45%. Hypokinesis of the apical septum/apex. Frequent PVCs which could be coming from this  region, cannot exclude ischemia. EF lower compared with 04/13/2018 with new wall motion abnormality.  LV Wall Scoring: The apical septal segment, apical anterior segment, and apex are hypokinetic. Right Ventricle: The right ventricular size is normal. No increase in right ventricular wall thickness. Global RV systolic function is has normal systolic function. The tricuspid regurgitant velocity is 2.13 m/s, and with an assumed right atrial pressure  of 3 mmHg, the estimated right ventricular systolic pressure is normal at 21.1 mmHg. Left Atrium: Left atrial size was normal in size. Right Atrium: Right atrial size was normal in size Pericardium: There is no evidence of pericardial effusion. Presence of pericardial fat pad. Mitral Valve: The mitral valve is grossly normal. Trivial mitral valve regurgitation. Tricuspid Valve: The tricuspid valve is grossly normal. Tricuspid valve regurgitation is trivial. Aortic Valve: The aortic valve is tricuspid. Aortic valve regurgitation is not visualized. Mild aortic valve sclerosis is present, with no evidence of aortic valve stenosis. Pulmonic Valve: The pulmonic valve was grossly normal. Pulmonic valve regurgitation is not visualized. Pulmonic regurgitation is not visualized. Aorta: The aortic root and ascending aorta are structurally normal, with no evidence of dilitation. There is mild, protruding plaque involving the ascending aorta. Venous: The inferior vena cava is normal in size with greater than 50% respiratory variability, suggesting right atrial pressure of 3 mmHg. IAS/Shunts: No atrial level shunt detected by color flow Doppler. Additional Comments: A pacer wire is visualized in the right atrium and right ventricle.  LEFT VENTRICLE PLAX 2D LVIDd:         4.89 cm       Diastology LVIDs:         3.21 cm       LV e' lateral:   5.22 cm/s LV PW:         1.10 cm       LV E/e' lateral: 11.9 LV IVS:        1.29 cm       LV e' medial:    4.35 cm/s LVOT diam:     2.20 cm        LV E/e' medial:  14.3 LV SV:         71  ml LV SV Index:   36.58 LVOT Area:     3.80 cm  LV Volumes (MOD) LV area d, A2C:    41.30 cm LV area d, A4C:    36.70 cm LV area s, A2C:    30.80 cm LV area s, A4C:    29.80 cm LV major d, A2C:   9.07 cm LV major d, A4C:   8.60 cm LV major s, A2C:   8.30 cm LV major s, A4C:   8.01 cm LV vol d, MOD A2C: 155.0 ml LV vol d, MOD A4C: 129.0 ml LV vol s, MOD A2C: 95.4 ml LV vol s, MOD A4C: 90.3 ml LV SV MOD A2C:     59.6 ml LV SV MOD A4C:     129.0 ml LV SV MOD BP:      50.9 ml RIGHT VENTRICLE RV S prime:     14.80 cm/s TAPSE (M-mode): 2.1 cm LEFT ATRIUM             Index       RIGHT ATRIUM           Index LA diam:        3.10 cm 1.62 cm/m  RA Area:     13.80 cm LA Vol (A2C):   77.3 ml 40.43 ml/m RA Volume:   31.90 ml  16.68 ml/m LA Vol (A4C):   44.7 ml 23.38 ml/m LA Biplane Vol: 61.6 ml 32.22 ml/m  AORTIC VALVE LVOT Vmax:   64.00 cm/s LVOT Vmean:  43.700 cm/s LVOT VTI:    0.132 m  AORTA Ao Root diam: 3.00 cm Ao Asc diam:  3.00 cm MITRAL VALVE                        TRICUSPID VALVE MV Area (PHT): 2.26 cm             TR Peak grad:   18.1 mmHg MV PHT:        97.15 msec           TR Vmax:        245.00 cm/s MV Decel Time: 335 msec MV E velocity: 62.10 cm/s 103 cm/s  SHUNTS MV A velocity: 84.80 cm/s 70.3 cm/s Systemic VTI:  0.13 m MV E/A ratio:  0.73       1.5       Systemic Diam: 2.20 cm  Eleonore Chiquito MD Electronically signed by Eleonore Chiquito MD Signature Date/Time: 11/07/2019/1:15:12 PM    Final     Patient Profile     PHELIX CALVANESE is a 78 y.o. 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 Zigmund Daniel.  Assessment & Plan    1. VT Now s/p upgrade to [facility] CRT-D  2. Acute on chronic systolic CHF, Pacing induced LBBB/CHF Echo 11/07/2019 LVEF 40-45% (down from 50-55%) LHC 11/07/2019 with stable 80% lesion in first  diagonal, otherwise non-obstructive CAD.  Increase toprol xl to 100 mg daily.  Continue spironolactone 12.5 mg daily.  He is NOT on an ACE/ARB with CKD stage III Would continue imdur, and add low dose hydralazine as tolerated.  Can take away amlodipine as needed to make room for HF oriented therapy  3. HTN Would adjust meds in the setting of treating his CHF, favoring increased toprol,  and eventually hydralazine as tolerated, over amlodipine.   4. CKD III Cr relatively stable today.   5. Paroxysmal AF On Eliquis for CHA2DS2VASC of 5   Per discussions with Dr. Lovena Le, would plan to resume Sunday evening.     He is clear for discharge from an EP perspective. Follow up and instructions have been placed in his chart and AVS.   For questions or updates, please contact [facility] Please consult [email or web address] for contact info under Cardiology/STEMI.  Signed, Shirley Friar, PA-C  11/09/2019, 8:01 AM   I have seen and examined this patient with Oda Kilts.  Agree with above, note added to reflect my findings.  On exam, RRR, no murmurs, lungs clear. S/p CRT-D upgrade. ICD functioning appropirately. CXR and without issues. EP to sign off. Would increase Toprol XLto 100 mg. Buddy Loeffelholz arrange follow up in device clinic.  Mlissa Tamayo M. Lanesha Azzaro MD 11/09/2019 11:20 AM

## 2019-11-09 NOTE — Telephone Encounter (Signed)
This encounter was created in error - please disregard.

## 2019-11-09 NOTE — Plan of Care (Signed)
  Problem: Clinical Measurements: Goal: Will remain free from infection Outcome: Completed/Met   Problem: Nutrition: Goal: Adequate nutrition will be maintained Outcome: Completed/Met   Problem: Coping: Goal: Level of anxiety will decrease Outcome: Completed/Met   Problem: Elimination: Goal: Will not experience complications related to bowel motility Outcome: Completed/Met Goal: Will not experience complications related to urinary retention Outcome: Completed/Met

## 2019-11-12 DIAGNOSIS — I083 Combined rheumatic disorders of mitral, aortic and tricuspid valves: Secondary | ICD-10-CM | POA: Diagnosis not present

## 2019-11-12 DIAGNOSIS — I1310 Hypertensive heart and chronic kidney disease without heart failure, with stage 1 through stage 4 chronic kidney disease, or unspecified chronic kidney disease: Secondary | ICD-10-CM | POA: Diagnosis not present

## 2019-11-12 DIAGNOSIS — Z7901 Long term (current) use of anticoagulants: Secondary | ICD-10-CM | POA: Diagnosis not present

## 2019-11-12 DIAGNOSIS — I480 Paroxysmal atrial fibrillation: Secondary | ICD-10-CM | POA: Diagnosis not present

## 2019-11-12 DIAGNOSIS — I459 Conduction disorder, unspecified: Secondary | ICD-10-CM | POA: Diagnosis not present

## 2019-11-12 DIAGNOSIS — I2510 Atherosclerotic heart disease of native coronary artery without angina pectoris: Secondary | ICD-10-CM | POA: Diagnosis not present

## 2019-11-12 DIAGNOSIS — S82832D Other fracture of upper and lower end of left fibula, subsequent encounter for closed fracture with routine healing: Secondary | ICD-10-CM | POA: Diagnosis not present

## 2019-11-12 DIAGNOSIS — Z7982 Long term (current) use of aspirin: Secondary | ICD-10-CM | POA: Diagnosis not present

## 2019-11-12 DIAGNOSIS — Z7951 Long term (current) use of inhaled steroids: Secondary | ICD-10-CM | POA: Diagnosis not present

## 2019-11-12 DIAGNOSIS — M2012 Hallux valgus (acquired), left foot: Secondary | ICD-10-CM | POA: Diagnosis not present

## 2019-11-12 DIAGNOSIS — Z87440 Personal history of urinary (tract) infections: Secondary | ICD-10-CM | POA: Diagnosis not present

## 2019-11-12 DIAGNOSIS — Z95810 Presence of automatic (implantable) cardiac defibrillator: Secondary | ICD-10-CM | POA: Diagnosis not present

## 2019-11-12 DIAGNOSIS — I428 Other cardiomyopathies: Secondary | ICD-10-CM | POA: Diagnosis not present

## 2019-11-12 DIAGNOSIS — I5032 Chronic diastolic (congestive) heart failure: Secondary | ICD-10-CM | POA: Diagnosis not present

## 2019-11-12 DIAGNOSIS — S92352D Displaced fracture of fifth metatarsal bone, left foot, subsequent encounter for fracture with routine healing: Secondary | ICD-10-CM | POA: Diagnosis not present

## 2019-11-12 DIAGNOSIS — I700 Atherosclerosis of aorta: Secondary | ICD-10-CM | POA: Diagnosis not present

## 2019-11-12 DIAGNOSIS — I0981 Rheumatic heart failure: Secondary | ICD-10-CM | POA: Diagnosis not present

## 2019-11-12 DIAGNOSIS — E785 Hyperlipidemia, unspecified: Secondary | ICD-10-CM | POA: Diagnosis not present

## 2019-11-12 DIAGNOSIS — I472 Ventricular tachycardia: Secondary | ICD-10-CM | POA: Diagnosis not present

## 2019-11-12 DIAGNOSIS — N183 Chronic kidney disease, stage 3 unspecified: Secondary | ICD-10-CM | POA: Diagnosis not present

## 2019-11-12 DIAGNOSIS — Z9181 History of falling: Secondary | ICD-10-CM | POA: Diagnosis not present

## 2019-11-12 DIAGNOSIS — I447 Left bundle-branch block, unspecified: Secondary | ICD-10-CM | POA: Diagnosis not present

## 2019-11-15 DIAGNOSIS — I459 Conduction disorder, unspecified: Secondary | ICD-10-CM | POA: Diagnosis not present

## 2019-11-15 DIAGNOSIS — Z7901 Long term (current) use of anticoagulants: Secondary | ICD-10-CM | POA: Diagnosis not present

## 2019-11-15 DIAGNOSIS — I0981 Rheumatic heart failure: Secondary | ICD-10-CM | POA: Diagnosis not present

## 2019-11-15 DIAGNOSIS — S92352D Displaced fracture of fifth metatarsal bone, left foot, subsequent encounter for fracture with routine healing: Secondary | ICD-10-CM | POA: Diagnosis not present

## 2019-11-15 DIAGNOSIS — I428 Other cardiomyopathies: Secondary | ICD-10-CM | POA: Diagnosis not present

## 2019-11-15 DIAGNOSIS — I5032 Chronic diastolic (congestive) heart failure: Secondary | ICD-10-CM | POA: Diagnosis not present

## 2019-11-15 DIAGNOSIS — I083 Combined rheumatic disorders of mitral, aortic and tricuspid valves: Secondary | ICD-10-CM | POA: Diagnosis not present

## 2019-11-15 DIAGNOSIS — Z9181 History of falling: Secondary | ICD-10-CM | POA: Diagnosis not present

## 2019-11-15 DIAGNOSIS — S82832D Other fracture of upper and lower end of left fibula, subsequent encounter for closed fracture with routine healing: Secondary | ICD-10-CM | POA: Diagnosis not present

## 2019-11-15 DIAGNOSIS — Z87440 Personal history of urinary (tract) infections: Secondary | ICD-10-CM | POA: Diagnosis not present

## 2019-11-15 DIAGNOSIS — I480 Paroxysmal atrial fibrillation: Secondary | ICD-10-CM | POA: Diagnosis not present

## 2019-11-15 DIAGNOSIS — I2510 Atherosclerotic heart disease of native coronary artery without angina pectoris: Secondary | ICD-10-CM | POA: Diagnosis not present

## 2019-11-15 DIAGNOSIS — Z7982 Long term (current) use of aspirin: Secondary | ICD-10-CM | POA: Diagnosis not present

## 2019-11-15 DIAGNOSIS — Z7951 Long term (current) use of inhaled steroids: Secondary | ICD-10-CM | POA: Diagnosis not present

## 2019-11-15 DIAGNOSIS — M2012 Hallux valgus (acquired), left foot: Secondary | ICD-10-CM | POA: Diagnosis not present

## 2019-11-15 DIAGNOSIS — I447 Left bundle-branch block, unspecified: Secondary | ICD-10-CM | POA: Diagnosis not present

## 2019-11-15 DIAGNOSIS — E785 Hyperlipidemia, unspecified: Secondary | ICD-10-CM | POA: Diagnosis not present

## 2019-11-15 DIAGNOSIS — I1310 Hypertensive heart and chronic kidney disease without heart failure, with stage 1 through stage 4 chronic kidney disease, or unspecified chronic kidney disease: Secondary | ICD-10-CM | POA: Diagnosis not present

## 2019-11-15 DIAGNOSIS — I700 Atherosclerosis of aorta: Secondary | ICD-10-CM | POA: Diagnosis not present

## 2019-11-15 DIAGNOSIS — N183 Chronic kidney disease, stage 3 unspecified: Secondary | ICD-10-CM | POA: Diagnosis not present

## 2019-11-15 DIAGNOSIS — I472 Ventricular tachycardia: Secondary | ICD-10-CM | POA: Diagnosis not present

## 2019-11-15 DIAGNOSIS — Z95810 Presence of automatic (implantable) cardiac defibrillator: Secondary | ICD-10-CM | POA: Diagnosis not present

## 2019-11-20 ENCOUNTER — Ambulatory Visit (INDEPENDENT_AMBULATORY_CARE_PROVIDER_SITE_OTHER): Payer: Medicare Other | Admitting: *Deleted

## 2019-11-20 ENCOUNTER — Other Ambulatory Visit: Payer: Self-pay

## 2019-11-20 DIAGNOSIS — I5032 Chronic diastolic (congestive) heart failure: Secondary | ICD-10-CM | POA: Diagnosis not present

## 2019-11-20 DIAGNOSIS — I428 Other cardiomyopathies: Secondary | ICD-10-CM | POA: Diagnosis not present

## 2019-11-20 DIAGNOSIS — I1310 Hypertensive heart and chronic kidney disease without heart failure, with stage 1 through stage 4 chronic kidney disease, or unspecified chronic kidney disease: Secondary | ICD-10-CM | POA: Diagnosis not present

## 2019-11-20 DIAGNOSIS — Z7901 Long term (current) use of anticoagulants: Secondary | ICD-10-CM | POA: Diagnosis not present

## 2019-11-20 DIAGNOSIS — Z87440 Personal history of urinary (tract) infections: Secondary | ICD-10-CM | POA: Diagnosis not present

## 2019-11-20 DIAGNOSIS — I2510 Atherosclerotic heart disease of native coronary artery without angina pectoris: Secondary | ICD-10-CM | POA: Diagnosis not present

## 2019-11-20 DIAGNOSIS — Z95810 Presence of automatic (implantable) cardiac defibrillator: Secondary | ICD-10-CM | POA: Diagnosis not present

## 2019-11-20 DIAGNOSIS — I495 Sick sinus syndrome: Secondary | ICD-10-CM

## 2019-11-20 DIAGNOSIS — Z7982 Long term (current) use of aspirin: Secondary | ICD-10-CM | POA: Diagnosis not present

## 2019-11-20 DIAGNOSIS — Z7951 Long term (current) use of inhaled steroids: Secondary | ICD-10-CM | POA: Diagnosis not present

## 2019-11-20 DIAGNOSIS — N183 Chronic kidney disease, stage 3 unspecified: Secondary | ICD-10-CM | POA: Diagnosis not present

## 2019-11-20 DIAGNOSIS — I441 Atrioventricular block, second degree: Secondary | ICD-10-CM

## 2019-11-20 DIAGNOSIS — I459 Conduction disorder, unspecified: Secondary | ICD-10-CM | POA: Diagnosis not present

## 2019-11-20 DIAGNOSIS — S82832D Other fracture of upper and lower end of left fibula, subsequent encounter for closed fracture with routine healing: Secondary | ICD-10-CM | POA: Diagnosis not present

## 2019-11-20 DIAGNOSIS — I083 Combined rheumatic disorders of mitral, aortic and tricuspid valves: Secondary | ICD-10-CM | POA: Diagnosis not present

## 2019-11-20 DIAGNOSIS — I0981 Rheumatic heart failure: Secondary | ICD-10-CM | POA: Diagnosis not present

## 2019-11-20 DIAGNOSIS — I700 Atherosclerosis of aorta: Secondary | ICD-10-CM | POA: Diagnosis not present

## 2019-11-20 DIAGNOSIS — M2012 Hallux valgus (acquired), left foot: Secondary | ICD-10-CM | POA: Diagnosis not present

## 2019-11-20 DIAGNOSIS — E785 Hyperlipidemia, unspecified: Secondary | ICD-10-CM | POA: Diagnosis not present

## 2019-11-20 DIAGNOSIS — S92352D Displaced fracture of fifth metatarsal bone, left foot, subsequent encounter for fracture with routine healing: Secondary | ICD-10-CM | POA: Diagnosis not present

## 2019-11-20 DIAGNOSIS — I472 Ventricular tachycardia: Secondary | ICD-10-CM | POA: Diagnosis not present

## 2019-11-20 DIAGNOSIS — I447 Left bundle-branch block, unspecified: Secondary | ICD-10-CM | POA: Diagnosis not present

## 2019-11-20 DIAGNOSIS — I480 Paroxysmal atrial fibrillation: Secondary | ICD-10-CM | POA: Diagnosis not present

## 2019-11-20 DIAGNOSIS — Z9181 History of falling: Secondary | ICD-10-CM | POA: Diagnosis not present

## 2019-11-20 NOTE — Patient Instructions (Signed)
Call the office if you have any increased swelling, any drainage , any redness or fever.

## 2019-11-21 ENCOUNTER — Ambulatory Visit (INDEPENDENT_AMBULATORY_CARE_PROVIDER_SITE_OTHER): Payer: Medicare Other | Admitting: Podiatry

## 2019-11-21 ENCOUNTER — Encounter: Payer: Self-pay | Admitting: Podiatry

## 2019-11-21 ENCOUNTER — Other Ambulatory Visit: Payer: Self-pay

## 2019-11-21 DIAGNOSIS — S82892S Other fracture of left lower leg, sequela: Secondary | ICD-10-CM

## 2019-11-21 DIAGNOSIS — M79674 Pain in right toe(s): Secondary | ICD-10-CM | POA: Diagnosis not present

## 2019-11-21 DIAGNOSIS — M79675 Pain in left toe(s): Secondary | ICD-10-CM | POA: Diagnosis not present

## 2019-11-21 DIAGNOSIS — B351 Tinea unguium: Secondary | ICD-10-CM | POA: Diagnosis not present

## 2019-11-21 DIAGNOSIS — D689 Coagulation defect, unspecified: Secondary | ICD-10-CM

## 2019-11-21 DIAGNOSIS — I739 Peripheral vascular disease, unspecified: Secondary | ICD-10-CM

## 2019-11-21 LAB — CUP PACEART INCLINIC DEVICE CHECK
Brady Statistic RA Percent Paced: 83 %
Brady Statistic RV Percent Paced: 87 %
Date Time Interrogation Session: 20201230091743
Implantable Lead Implant Date: 20190821
Implantable Lead Implant Date: 20201217
Implantable Lead Implant Date: 20201217
Implantable Lead Location: 753858
Implantable Lead Location: 753859
Implantable Lead Location: 753860
Implantable Pulse Generator Implant Date: 20201217
Lead Channel Pacing Threshold Amplitude: 0.5 V
Lead Channel Pacing Threshold Amplitude: 0.5 V
Lead Channel Pacing Threshold Amplitude: 1.25 V
Lead Channel Pacing Threshold Pulse Width: 0.4 ms
Lead Channel Pacing Threshold Pulse Width: 0.4 ms
Lead Channel Pacing Threshold Pulse Width: 0.5 ms
Lead Channel Sensing Intrinsic Amplitude: 11.4 mV
Lead Channel Sensing Intrinsic Amplitude: 3 mV
Pulse Gen Serial Number: 111014567

## 2019-11-21 NOTE — Progress Notes (Signed)
Complaint:  Visit Type: Patient returns to my office for continued preventative foot care services. Complaint: Patient states" my nails have grown long and thick and become painful to walk and wear shoes" Patient has been diagnosed with peripheral arterial disease. The patient presents for preventative foot care services. No changes to ROS.  Patient is taking eliquiss. Patient has history of falling which led to fracture left ankle and head fifth metatarsal left foot. Injury was 2 weeks ago.He was seen at the ED where xrays were taken.  He was diagnosed with fracture of ankle and head fifth metatarsal.  He was dispensed a cam walker and told to follow up with this office in 2 weeks.  Podiatric Exam: Vascular: dorsalis pedis and posterior tibial pulses are palpable bilateral. Capillary return is immediate. Temperature gradient is WNL. Skin turgor WNL  Sensorium: Normal Semmes Weinstein monofilament test. Normal tactile sensation bilaterally. Nail Exam: Pt has thick disfigured discolored nails with subungual debris noted bilateral entire nail hallux through fifth toenails Ulcer Exam: There is no evidence of ulcer or pre-ulcerative changes or infection. Orthopedic Exam: Muscle tone and strength are WNL. No limitations in general ROM. No crepitus or effusions noted. Foot type and digits show no abnormalities. Bony prominences are unremarkable. Pain left ankle at fibula and over fifth metatarsal left foot. Skin: No Porokeratosis. No infection or ulcers  Diagnosis:  Onychomycosis, , Pain in right toe, pain in left toes  Ankle fracture.  Foot fracture.  Treatment & Plan Procedures and Treatment: Consent by patient was obtained for treatment procedures.   Debridement of mycotic and hypertrophic toenails, 1 through 5 bilateral and clearing of subungual debris. No ulceration, no infection noted. Patient to make an appointment for fractures. Dr.  Posey Pronto is to review his xrays today.  Return Visit-Office  Procedure: Patient instructed to return to the office for a follow up visit 3 months for continued evaluation and treatment.    Gardiner Barefoot DPM

## 2019-11-21 NOTE — Progress Notes (Signed)
  Wound check appointment. Steri-strips removed. Wound without redness or edema. Incision edges approximated, wound well healed. Normal device function. Thresholds, sensing, and impedances consistent with implant measurements. Device programmed at 3.5V for extra safety margin until 3 month visit. Histogram distribution appropriate for patient and level of activity. No mode switches or ventricular arrhythmias noted. Patient educated about wound care, arm mobility, lifting restrictions, shock plan. Next remote 02/07/20 and follow up with Dr Lovena Le on 02/20/20.

## 2019-11-27 DIAGNOSIS — I2510 Atherosclerotic heart disease of native coronary artery without angina pectoris: Secondary | ICD-10-CM | POA: Diagnosis not present

## 2019-11-27 DIAGNOSIS — I0981 Rheumatic heart failure: Secondary | ICD-10-CM | POA: Diagnosis not present

## 2019-11-27 DIAGNOSIS — Z7982 Long term (current) use of aspirin: Secondary | ICD-10-CM | POA: Diagnosis not present

## 2019-11-27 DIAGNOSIS — S82832D Other fracture of upper and lower end of left fibula, subsequent encounter for closed fracture with routine healing: Secondary | ICD-10-CM | POA: Diagnosis not present

## 2019-11-27 DIAGNOSIS — I428 Other cardiomyopathies: Secondary | ICD-10-CM | POA: Diagnosis not present

## 2019-11-27 DIAGNOSIS — N183 Chronic kidney disease, stage 3 unspecified: Secondary | ICD-10-CM | POA: Diagnosis not present

## 2019-11-27 DIAGNOSIS — Z95810 Presence of automatic (implantable) cardiac defibrillator: Secondary | ICD-10-CM | POA: Diagnosis not present

## 2019-11-27 DIAGNOSIS — I459 Conduction disorder, unspecified: Secondary | ICD-10-CM | POA: Diagnosis not present

## 2019-11-27 DIAGNOSIS — I447 Left bundle-branch block, unspecified: Secondary | ICD-10-CM | POA: Diagnosis not present

## 2019-11-27 DIAGNOSIS — Z7951 Long term (current) use of inhaled steroids: Secondary | ICD-10-CM | POA: Diagnosis not present

## 2019-11-27 DIAGNOSIS — Z87440 Personal history of urinary (tract) infections: Secondary | ICD-10-CM | POA: Diagnosis not present

## 2019-11-27 DIAGNOSIS — I083 Combined rheumatic disorders of mitral, aortic and tricuspid valves: Secondary | ICD-10-CM | POA: Diagnosis not present

## 2019-11-27 DIAGNOSIS — Z9181 History of falling: Secondary | ICD-10-CM | POA: Diagnosis not present

## 2019-11-27 DIAGNOSIS — I1310 Hypertensive heart and chronic kidney disease without heart failure, with stage 1 through stage 4 chronic kidney disease, or unspecified chronic kidney disease: Secondary | ICD-10-CM | POA: Diagnosis not present

## 2019-11-27 DIAGNOSIS — Z7901 Long term (current) use of anticoagulants: Secondary | ICD-10-CM | POA: Diagnosis not present

## 2019-11-27 DIAGNOSIS — E785 Hyperlipidemia, unspecified: Secondary | ICD-10-CM | POA: Diagnosis not present

## 2019-11-27 DIAGNOSIS — S92352D Displaced fracture of fifth metatarsal bone, left foot, subsequent encounter for fracture with routine healing: Secondary | ICD-10-CM | POA: Diagnosis not present

## 2019-11-27 DIAGNOSIS — I472 Ventricular tachycardia: Secondary | ICD-10-CM | POA: Diagnosis not present

## 2019-11-27 DIAGNOSIS — I480 Paroxysmal atrial fibrillation: Secondary | ICD-10-CM | POA: Diagnosis not present

## 2019-11-27 DIAGNOSIS — M2012 Hallux valgus (acquired), left foot: Secondary | ICD-10-CM | POA: Diagnosis not present

## 2019-11-27 DIAGNOSIS — I5032 Chronic diastolic (congestive) heart failure: Secondary | ICD-10-CM | POA: Diagnosis not present

## 2019-11-27 DIAGNOSIS — I700 Atherosclerosis of aorta: Secondary | ICD-10-CM | POA: Diagnosis not present

## 2019-11-28 ENCOUNTER — Ambulatory Visit (INDEPENDENT_AMBULATORY_CARE_PROVIDER_SITE_OTHER): Payer: Medicare Other

## 2019-11-28 ENCOUNTER — Ambulatory Visit (INDEPENDENT_AMBULATORY_CARE_PROVIDER_SITE_OTHER): Payer: Medicare Other | Admitting: Podiatry

## 2019-11-28 ENCOUNTER — Other Ambulatory Visit: Payer: Self-pay

## 2019-11-28 DIAGNOSIS — S92302D Fracture of unspecified metatarsal bone(s), left foot, subsequent encounter for fracture with routine healing: Secondary | ICD-10-CM

## 2019-11-28 DIAGNOSIS — S82892S Other fracture of left lower leg, sequela: Secondary | ICD-10-CM

## 2019-11-28 DIAGNOSIS — M25572 Pain in left ankle and joints of left foot: Secondary | ICD-10-CM | POA: Diagnosis not present

## 2019-11-28 DIAGNOSIS — S92302A Fracture of unspecified metatarsal bone(s), left foot, initial encounter for closed fracture: Secondary | ICD-10-CM

## 2019-11-28 DIAGNOSIS — M79672 Pain in left foot: Secondary | ICD-10-CM | POA: Diagnosis not present

## 2019-11-29 ENCOUNTER — Other Ambulatory Visit: Payer: Self-pay | Admitting: Podiatry

## 2019-11-29 DIAGNOSIS — S92302D Fracture of unspecified metatarsal bone(s), left foot, subsequent encounter for fracture with routine healing: Secondary | ICD-10-CM

## 2019-11-30 DIAGNOSIS — I5032 Chronic diastolic (congestive) heart failure: Secondary | ICD-10-CM | POA: Diagnosis not present

## 2019-11-30 DIAGNOSIS — I472 Ventricular tachycardia: Secondary | ICD-10-CM | POA: Diagnosis not present

## 2019-11-30 DIAGNOSIS — Z7951 Long term (current) use of inhaled steroids: Secondary | ICD-10-CM | POA: Diagnosis not present

## 2019-11-30 DIAGNOSIS — I700 Atherosclerosis of aorta: Secondary | ICD-10-CM | POA: Diagnosis not present

## 2019-11-30 DIAGNOSIS — Z95810 Presence of automatic (implantable) cardiac defibrillator: Secondary | ICD-10-CM | POA: Diagnosis not present

## 2019-11-30 DIAGNOSIS — I1310 Hypertensive heart and chronic kidney disease without heart failure, with stage 1 through stage 4 chronic kidney disease, or unspecified chronic kidney disease: Secondary | ICD-10-CM | POA: Diagnosis not present

## 2019-11-30 DIAGNOSIS — I459 Conduction disorder, unspecified: Secondary | ICD-10-CM | POA: Diagnosis not present

## 2019-11-30 DIAGNOSIS — S82832D Other fracture of upper and lower end of left fibula, subsequent encounter for closed fracture with routine healing: Secondary | ICD-10-CM | POA: Diagnosis not present

## 2019-11-30 DIAGNOSIS — Z9181 History of falling: Secondary | ICD-10-CM | POA: Diagnosis not present

## 2019-11-30 DIAGNOSIS — Z7901 Long term (current) use of anticoagulants: Secondary | ICD-10-CM | POA: Diagnosis not present

## 2019-11-30 DIAGNOSIS — N183 Chronic kidney disease, stage 3 unspecified: Secondary | ICD-10-CM | POA: Diagnosis not present

## 2019-11-30 DIAGNOSIS — Z87440 Personal history of urinary (tract) infections: Secondary | ICD-10-CM | POA: Diagnosis not present

## 2019-11-30 DIAGNOSIS — I2510 Atherosclerotic heart disease of native coronary artery without angina pectoris: Secondary | ICD-10-CM | POA: Diagnosis not present

## 2019-11-30 DIAGNOSIS — S92352D Displaced fracture of fifth metatarsal bone, left foot, subsequent encounter for fracture with routine healing: Secondary | ICD-10-CM | POA: Diagnosis not present

## 2019-11-30 DIAGNOSIS — I447 Left bundle-branch block, unspecified: Secondary | ICD-10-CM | POA: Diagnosis not present

## 2019-11-30 DIAGNOSIS — I083 Combined rheumatic disorders of mitral, aortic and tricuspid valves: Secondary | ICD-10-CM | POA: Diagnosis not present

## 2019-11-30 DIAGNOSIS — I428 Other cardiomyopathies: Secondary | ICD-10-CM | POA: Diagnosis not present

## 2019-11-30 DIAGNOSIS — I0981 Rheumatic heart failure: Secondary | ICD-10-CM | POA: Diagnosis not present

## 2019-11-30 DIAGNOSIS — M2012 Hallux valgus (acquired), left foot: Secondary | ICD-10-CM | POA: Diagnosis not present

## 2019-11-30 DIAGNOSIS — E785 Hyperlipidemia, unspecified: Secondary | ICD-10-CM | POA: Diagnosis not present

## 2019-11-30 DIAGNOSIS — I480 Paroxysmal atrial fibrillation: Secondary | ICD-10-CM | POA: Diagnosis not present

## 2019-11-30 DIAGNOSIS — Z7982 Long term (current) use of aspirin: Secondary | ICD-10-CM | POA: Diagnosis not present

## 2019-12-02 NOTE — Progress Notes (Signed)
   HPI: 79 y.o. male presenting today for follow up evaluation of a fractured left fibula and 5th metatarsal. He reports sharp pain to the foot and ankle for the past month after sustaining the injury when he had a syncopal event and fell. He has been using the CAM boot and walker as directed as well as taking OTC Tylenol for pain. There are no worsening factors noted. Patient is here for further evaluation and treatment.   Past Medical History:  Diagnosis Date  . CAD (coronary artery disease) 80% stenosis diag of the LAD, 30% in OM2 branch of LCX in 2009    a. Nonobstructive CAD by cath 11/2011 with the exception of the pre-existing diagonal branch #2 lesion.  . Chronic systolic CHF (congestive heart failure) ([place])   . CKD (chronic kidney disease) stage 3, GFR 30-59 ml/min   . Colon polyp, hyperplastic   . History of stress test 06/01/2012   Normal myocardial perfusion study. compared to the previous study there is no significant change. this is a low risk scan  . Hypertension   . Legally blind    "both eyes"  . Myocardial infarction ([place]) 11/22/11  . NICM (nonischemic cardiomyopathy) ([place])    a. Remote hx of dilated NICM with EF ranging 20-45%, including normal EF by echo (55-60%) in 2014.  Marland Kitchen Peripheral arterial disease ([place])    a. 06/2014: ABI right 0.99, left 1.2, LE dopplers revealing an occluded right posterior tibial. As symptoms were not felt r/t claudication, no further w/u at the time.  Marland Kitchen PVC's (premature ventricular contractions)   . 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 Requiring discontinuation of BB dose.  Marland Kitchen Spondylolisthesis   . Ventricular bigeminy   . Ventricular tachycardia (paroxysmal) ([place]) 04/11/2015     Physical Exam: General: The patient is alert and oriented x3 in no acute distress.  Dermatology: Skin is warm, dry and supple bilateral lower extremities. Negative for open lesions or macerations.  Vascular: Palpable pedal pulses bilaterally. No edema or  erythema noted. Capillary refill within normal limits.  Neurological: Epicritic and protective threshold grossly intact bilaterally.   Musculoskeletal Exam: Pain with palpation noted to the lateral left ankle as well as to the 5th metatarsal of the left foot. Range of motion within normal limits to all pedal and ankle joints bilateral. Muscle strength 5/5 in all groups bilateral.   Radiographic Exam:  Minimally displaced, closed fracture noted to the left fibula.    Assessment: 1. Closed, minimally displaced fibula left 2. Closed, minimally displaced fracture of the 5th metatarsal diaphysis left    Plan of Care:  1. Patient evaluated. X-Rays reviewed.  2. Recommended conservative treatment at this time.  3. Continue nonweightbearing in CAM boot with walker.  4. Continue taking OTC Tylenol as needed.  5. Return to clinic in 4 weeks.       Edrick Kins, DPM [facility]  Dr. Edrick Kins, DPM    [address], [place] [address]                Office [phone]  Fax [phone]

## 2019-12-04 DIAGNOSIS — I2510 Atherosclerotic heart disease of native coronary artery without angina pectoris: Secondary | ICD-10-CM | POA: Diagnosis not present

## 2019-12-04 DIAGNOSIS — Z87440 Personal history of urinary (tract) infections: Secondary | ICD-10-CM | POA: Diagnosis not present

## 2019-12-04 DIAGNOSIS — Z7951 Long term (current) use of inhaled steroids: Secondary | ICD-10-CM | POA: Diagnosis not present

## 2019-12-04 DIAGNOSIS — S82832D Other fracture of upper and lower end of left fibula, subsequent encounter for closed fracture with routine healing: Secondary | ICD-10-CM | POA: Diagnosis not present

## 2019-12-04 DIAGNOSIS — I447 Left bundle-branch block, unspecified: Secondary | ICD-10-CM | POA: Diagnosis not present

## 2019-12-04 DIAGNOSIS — Z95810 Presence of automatic (implantable) cardiac defibrillator: Secondary | ICD-10-CM | POA: Diagnosis not present

## 2019-12-04 DIAGNOSIS — Z7901 Long term (current) use of anticoagulants: Secondary | ICD-10-CM | POA: Diagnosis not present

## 2019-12-04 DIAGNOSIS — I083 Combined rheumatic disorders of mitral, aortic and tricuspid valves: Secondary | ICD-10-CM | POA: Diagnosis not present

## 2019-12-04 DIAGNOSIS — Z7982 Long term (current) use of aspirin: Secondary | ICD-10-CM | POA: Diagnosis not present

## 2019-12-04 DIAGNOSIS — I700 Atherosclerosis of aorta: Secondary | ICD-10-CM | POA: Diagnosis not present

## 2019-12-04 DIAGNOSIS — I0981 Rheumatic heart failure: Secondary | ICD-10-CM | POA: Diagnosis not present

## 2019-12-04 DIAGNOSIS — I428 Other cardiomyopathies: Secondary | ICD-10-CM | POA: Diagnosis not present

## 2019-12-04 DIAGNOSIS — Z9181 History of falling: Secondary | ICD-10-CM | POA: Diagnosis not present

## 2019-12-04 DIAGNOSIS — I480 Paroxysmal atrial fibrillation: Secondary | ICD-10-CM | POA: Diagnosis not present

## 2019-12-04 DIAGNOSIS — N183 Chronic kidney disease, stage 3 unspecified: Secondary | ICD-10-CM | POA: Diagnosis not present

## 2019-12-04 DIAGNOSIS — S92352D Displaced fracture of fifth metatarsal bone, left foot, subsequent encounter for fracture with routine healing: Secondary | ICD-10-CM | POA: Diagnosis not present

## 2019-12-04 DIAGNOSIS — E785 Hyperlipidemia, unspecified: Secondary | ICD-10-CM | POA: Diagnosis not present

## 2019-12-04 DIAGNOSIS — I5032 Chronic diastolic (congestive) heart failure: Secondary | ICD-10-CM | POA: Diagnosis not present

## 2019-12-04 DIAGNOSIS — M2012 Hallux valgus (acquired), left foot: Secondary | ICD-10-CM | POA: Diagnosis not present

## 2019-12-04 DIAGNOSIS — I472 Ventricular tachycardia: Secondary | ICD-10-CM | POA: Diagnosis not present

## 2019-12-04 DIAGNOSIS — I459 Conduction disorder, unspecified: Secondary | ICD-10-CM | POA: Diagnosis not present

## 2019-12-04 DIAGNOSIS — I1310 Hypertensive heart and chronic kidney disease without heart failure, with stage 1 through stage 4 chronic kidney disease, or unspecified chronic kidney disease: Secondary | ICD-10-CM | POA: Diagnosis not present

## 2019-12-05 ENCOUNTER — Other Ambulatory Visit: Payer: Self-pay | Admitting: Cardiovascular Disease

## 2019-12-19 ENCOUNTER — Emergency Department (HOSPITAL_COMMUNITY): Payer: Medicare Other

## 2019-12-19 ENCOUNTER — Other Ambulatory Visit: Payer: Self-pay

## 2019-12-19 ENCOUNTER — Emergency Department (HOSPITAL_COMMUNITY)
Admission: EM | Admit: 2019-12-19 | Discharge: 2019-12-19 | Disposition: A | Discharge status: Home / Self Care | Payer: Medicare Other | Attending: Emergency Medicine | Admitting: Emergency Medicine

## 2019-12-19 ENCOUNTER — Encounter (HOSPITAL_COMMUNITY): Payer: Self-pay | Admitting: Emergency Medicine

## 2019-12-19 DIAGNOSIS — I2510 Atherosclerotic heart disease of native coronary artery without angina pectoris: Secondary | ICD-10-CM | POA: Diagnosis not present

## 2019-12-19 DIAGNOSIS — Z95811 Presence of heart assist device: Secondary | ICD-10-CM | POA: Insufficient documentation

## 2019-12-19 DIAGNOSIS — I5022 Chronic systolic (congestive) heart failure: Secondary | ICD-10-CM | POA: Diagnosis not present

## 2019-12-19 DIAGNOSIS — S0990XA Unspecified injury of head, initial encounter: Secondary | ICD-10-CM | POA: Diagnosis not present

## 2019-12-19 DIAGNOSIS — Y93E1 Activity, personal bathing and showering: Secondary | ICD-10-CM | POA: Diagnosis not present

## 2019-12-19 DIAGNOSIS — R55 Syncope and collapse: Secondary | ICD-10-CM | POA: Diagnosis not present

## 2019-12-19 DIAGNOSIS — S2242XA Multiple fractures of ribs, left side, initial encounter for closed fracture: Secondary | ICD-10-CM | POA: Diagnosis not present

## 2019-12-19 DIAGNOSIS — Y999 Unspecified external cause status: Secondary | ICD-10-CM | POA: Insufficient documentation

## 2019-12-19 DIAGNOSIS — N183 Chronic kidney disease, stage 3 unspecified: Secondary | ICD-10-CM | POA: Diagnosis not present

## 2019-12-19 DIAGNOSIS — Z87891 Personal history of nicotine dependence: Secondary | ICD-10-CM | POA: Diagnosis not present

## 2019-12-19 DIAGNOSIS — R0789 Other chest pain: Secondary | ICD-10-CM | POA: Diagnosis present

## 2019-12-19 DIAGNOSIS — W01198A Fall on same level from slipping, tripping and stumbling with subsequent striking against other object, initial encounter: Secondary | ICD-10-CM | POA: Diagnosis not present

## 2019-12-19 DIAGNOSIS — Y92012 Bathroom of single-family (private) house as the place of occurrence of the external cause: Secondary | ICD-10-CM | POA: Diagnosis not present

## 2019-12-19 DIAGNOSIS — I252 Old myocardial infarction: Secondary | ICD-10-CM | POA: Insufficient documentation

## 2019-12-19 DIAGNOSIS — I4891 Unspecified atrial fibrillation: Secondary | ICD-10-CM | POA: Diagnosis not present

## 2019-12-19 DIAGNOSIS — Z7901 Long term (current) use of anticoagulants: Secondary | ICD-10-CM | POA: Insufficient documentation

## 2019-12-19 DIAGNOSIS — S199XXA Unspecified injury of neck, initial encounter: Secondary | ICD-10-CM | POA: Diagnosis not present

## 2019-12-19 DIAGNOSIS — I130 Hypertensive heart and chronic kidney disease with heart failure and stage 1 through stage 4 chronic kidney disease, or unspecified chronic kidney disease: Secondary | ICD-10-CM | POA: Insufficient documentation

## 2019-12-19 DIAGNOSIS — S299XXA Unspecified injury of thorax, initial encounter: Secondary | ICD-10-CM | POA: Diagnosis not present

## 2019-12-19 DIAGNOSIS — R0781 Pleurodynia: Secondary | ICD-10-CM | POA: Diagnosis not present

## 2019-12-19 DIAGNOSIS — S3991XA Unspecified injury of abdomen, initial encounter: Secondary | ICD-10-CM | POA: Diagnosis not present

## 2019-12-19 DIAGNOSIS — R0602 Shortness of breath: Secondary | ICD-10-CM | POA: Diagnosis not present

## 2019-12-19 LAB — CBC WITH DIFFERENTIAL/PLATELET
Abs Immature Granulocytes: 0.02 10*3/uL (ref 0.00–0.07)
Basophils Absolute: 0 10*3/uL (ref 0.0–0.1)
Basophils Relative: 0 %
Eosinophils Absolute: 0 10*3/uL (ref 0.0–0.5)
Eosinophils Relative: 1 %
HCT: 44.5 % (ref 39.0–52.0)
Hemoglobin: 14.2 g/dL (ref 13.0–17.0)
Immature Granulocytes: 1 %
Lymphocytes Relative: 28 %
Lymphs Abs: 0.9 10*3/uL (ref 0.7–4.0)
MCH: 30.3 pg (ref 26.0–34.0)
MCHC: 31.9 g/dL (ref 30.0–36.0)
MCV: 94.9 fL (ref 80.0–100.0)
Monocytes Absolute: 0.4 10*3/uL (ref 0.1–1.0)
Monocytes Relative: 12 %
Neutro Abs: 1.8 10*3/uL (ref 1.7–7.7)
Neutrophils Relative %: 58 %
Platelets: 77 10*3/uL — ABNORMAL LOW (ref 150–400)
RBC: 4.69 MIL/uL (ref 4.22–5.81)
RDW: 13 % (ref 11.5–15.5)
WBC: 3 10*3/uL — ABNORMAL LOW (ref 4.0–10.5)
nRBC: 0 % (ref 0.0–0.2)

## 2019-12-19 LAB — BASIC METABOLIC PANEL
Anion gap: 8 (ref 5–15)
BUN: 20 mg/dL (ref 8–23)
CO2: 23 mmol/L (ref 22–32)
Calcium: 8.6 mg/dL — ABNORMAL LOW (ref 8.9–10.3)
Chloride: 108 mmol/L (ref 98–111)
Creatinine, Ser: 1.8 mg/dL — ABNORMAL HIGH (ref 0.61–1.24)
GFR calc Af Amer: 40 mL/min — ABNORMAL LOW (ref >60)
GFR calc non Af Amer: 35 mL/min — ABNORMAL LOW (ref >60)
Glucose, Bld: 97 mg/dL (ref 70–99)
Potassium: 4.2 mmol/L (ref 3.5–5.1)
Sodium: 139 mmol/L (ref 135–145)

## 2019-12-19 MED ORDER — IOPAMIDOL (ISOVUE-300) INJECTION 61%
75.0000 mL | Freq: Once | INTRAVENOUS | Status: AC | PRN
  Administered 2019-12-19: 20:00:00 75 mL via INTRAVENOUS

## 2019-12-19 MED ORDER — HYDROCODONE-ACETAMINOPHEN 5-325 MG PO TABS: 1.0000 | ORAL_TABLET | ORAL | 0 refills | Status: DC | PRN

## 2019-12-19 MED ORDER — IOPAMIDOL (ISOVUE-300) INJECTION 61%
75.0000 mL | Freq: Once | INTRAVENOUS | Status: AC | PRN
  Administered 2019-12-19: 75 mL via INTRAVENOUS

## 2019-12-19 MED ORDER — HYDROCODONE-ACETAMINOPHEN 5-325 MG PO TABS
1.0000 | ORAL_TABLET | Freq: Once | ORAL | Status: AC
  Administered 2019-12-19: 16:00:00 1 via ORAL
  Filled 2019-12-19: qty 1

## 2019-12-19 MED ORDER — LIDOCAINE 5 % EX PTCH: 1.0000 | MEDICATED_PATCH | CUTANEOUS | 0 refills | Status: DC

## 2019-12-19 NOTE — ED Provider Notes (Signed)
  Provider Note MRN:  [id number]  Arrival date & time: 12/19/19    ED Course and Medical Decision Making  Assumed care from Dr. Wyvonnia Dusky at shift change.  Recent fall, on blood thinners, awaiting CT imaging.  8:30 PM update: Imaging is reassuring, patient is appropriate for discharge.  Procedures  Final Clinical Impressions(s) / ED Diagnoses     ICD-10-CM   1. Closed fracture of multiple ribs of left side, initial encounter  S22.42XA     ED Discharge Orders         Ordered    HYDROcodone-acetaminophen (NORCO/VICODIN) 5-325 MG tablet  Every 4 hours PRN,   Status:  Discontinued     12/19/19 1632    HYDROcodone-acetaminophen (NORCO/VICODIN) 5-325 MG tablet  Every 4 hours PRN     12/19/19 1633    lidocaine (LIDODERM) 5 %  Every 24 hours     12/19/19 2054            Discharge Instructions     Take the pain medication as prescribed.  Do not take it if you are working or driving.  Follow-up with your doctor.  Return to the ED with new or worsening symptoms.     Barth Kirks. [place] Small, [place] [email or web address]    Maudie Flakes, MD 12/19/19 2055

## 2019-12-19 NOTE — Discharge Instructions (Addendum)
Take the pain medication as prescribed.  Do not take it if you are working or driving.  Follow-up with your doctor.  Return to the ED with new or worsening symptoms.

## 2019-12-19 NOTE — ED Provider Notes (Signed)
[facility] EMERGENCY DEPARTMENT Provider Note   CSN: [id number] Arrival date & time: 12/19/19  1253     History Chief Complaint  Patient presents with  . Fall    HARIHARAN KLOOS is a 79 y.o. male.  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  States he had a slip and fall in the bathtub about 2 weeks ago landing on his left side.  Has had persistent pain since that is worse with breathing and worse with movement of his left arm.  Is been taking Tylenol without relief.  He denies any chest pain but has some shortness of breath.  No cough or fever.  Denies hitting his head.  Patient was admitted to the hospital in December after syncopal episode.  But he states this fall happened after this.  He does take Eliquis.  Syncopal episode was determined to be due to ventricular tachycardia his AICD was updated.  He denies any recurrent syncope.  The history is provided by the patient.  Fall Associated symptoms include shortness of breath. Pertinent negatives include no chest pain, no abdominal pain and no headaches.       Past Medical History:  Diagnosis Date  . CAD (coronary artery disease) 80% stenosis diag of the LAD, 30% in OM2 branch of LCX in 2009    a. Nonobstructive CAD by cath 11/2011 with the exception of the pre-existing diagonal branch #2 lesion.  . Chronic systolic CHF (congestive heart failure) ([place])   . CKD (chronic kidney disease) stage 3, GFR 30-59 ml/min   . Colon polyp, hyperplastic   . History of stress test 06/01/2012   Normal myocardial perfusion study. compared to the previous study there is no significant change. this is a low risk scan  . Hypertension   . Legally blind    "both eyes"  . Myocardial infarction ([place]) 11/22/11  . NICM (nonischemic cardiomyopathy) ([place])    a. Remote hx of dilated NICM with EF  ranging 20-45%, including normal EF by echo (55-60%) in 2014.  Marland Kitchen Peripheral arterial disease ([place])    a. 06/2014: ABI right 0.99, left 1.2, LE dopplers revealing an occluded right posterior tibial. As symptoms were not felt r/t claudication, no further w/u at the time.  Marland Kitchen PVC's (premature ventricular contractions)   . 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 Requiring discontinuation of BB dose.  Marland Kitchen Spondylolisthesis   . Ventricular bigeminy   . Ventricular tachycardia (paroxysmal) ([place]) 04/11/2015    Patient Active Problem List   Diagnosis Date Noted  . ICD (implantable cardioverter-defibrillator) in place   . Cardiac syncope 11/07/2019  . Closed fracture of distal fibula 11/07/2019  . Pain due to onychomycosis of toenails of both feet 05/16/2019  . Coagulation disorder ([place]) 05/16/2019  . Paroxysmal atrial fibrillation ([place]) 08/02/2018  . NSVT (nonsustained ventricular tachycardia) ([place]) 08/02/2018  . Symptomatic bradycardia 07/12/2018  . Pacemaker 07/12/2018  . Tachycardia-bradycardia syndrome ([place])   . Unsteady gait 03/12/2016  . Nausea vomiting and diarrhea 03/11/2016  . Acute lower UTI 03/11/2016  . Gastroenteritis 03/11/2016  . Obstipation 03/11/2016  . Abdominal pain   . Constipation   . Lactic acidosis   . Leg pain   . Essential hypertension 03/04/2016  . Ventricular tachycardia (paroxysmal) ([place]) 04/11/2015  . Chest pain with moderate risk for cardiac etiology 04/09/2015  . CAP (community acquired pneumonia)  02/18/2015  . Chest pain at rest 02/18/2015  . Peripheral arterial disease ([place]) 08/15/2014  . Hyperlipidemia with target LDL less than 70 01/01/2014  . GERD (gastroesophageal reflux disease) 01/01/2014  . First degree AV block 07/31/2012  . Unstable angina, negative MI 06/05/2012  . Second degree AV block, Mobitz type I 05/27/2012  . CKD (chronic kidney disease) stage 3, GFR 30-59 ml/min 05/27/2012  . 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 Chronic diastolic CHF (congestive heart failure) ([place]) 11/23/2011    Class: Diagnosis of  . Coronary artery disease involving native coronary artery of native heart without angina pectoris 11/22/2011  . Premature ventricular contractions 11/22/2011  . Esophageal reflux 07/28/2011    Past Surgical History:  Procedure Laterality Date  . BACK SURGERY    . BIV UPGRADE N/A 11/08/2019   Procedure: BIV UPGRADE;  Surgeon: Evans Lance, MD;  Location: [facility] LAB;  Service: Cardiovascular;  Laterality: N/A;  . CARDIAC CATHETERIZATION  11/2011  . CARDIAC CATHETERIZATION  11/2011   didn't demonstrate high grade obstructive disease to account for his LV dysfunction.  Marland Kitchen CATARACT EXTRACTION, BILATERAL  1990's  . CYSTOSCOPY    . CYSTOSCOPY WITH URETHRAL DILATATION N/A 05/04/2013   Procedure: CYSTOSCOPY WITH URETHRAL DILATATION;  Surgeon: Eustace Moore, MD;  Location: [facility] NEURO ORS;  Service: Neurosurgery;  Laterality: N/A;  with insertion of foley catheter  . EP study and ablation of VT  7/13   PVC focus mapped to the right coronary cusp of the aorta, limited ablation performed due to proximity of the focus to the right coronary artery  . EYE SURGERY    . LEFT HEART CATH AND CORONARY ANGIOGRAPHY N/A 11/07/2019   Procedure: LEFT HEART CATH AND CORONARY ANGIOGRAPHY;  Surgeon: Wellington Hampshire, MD;  Location: [facility] CV LAB;  Service: Cardiovascular;  Laterality: N/A;  . LEFT HEART CATHETERIZATION WITH CORONARY ANGIOGRAM N/A 11/25/2011   Procedure: LEFT HEART CATHETERIZATION WITH CORONARY ANGIOGRAM;  Surgeon: Leonie Man, MD;  Location: [facility] CATH LAB;  Service: Cardiovascular;  Laterality: N/A;  . PACEMAKER IMPLANT N/A 07/12/2018   Procedure: PACEMAKER IMPLANT;  Surgeon: Sanda Klein, MD;  Location: [place] CV LAB;  Service: Cardiovascular;  Laterality: N/A;  . POSTERIOR FUSION [place]  . ROTATOR CUFF REPAIR   2000's   left  . V-TACH ABLATION N/A 06/06/2012   Procedure: V-TACH ABLATION;  Surgeon: Thompson Grayer, MD;  Location: [facility] C[facility] LAB;  Service: Cardiovascular;  Laterality: N/A;       Family History  Problem Relation Age of Onset  . Asthma Mother   . Other Other        Unsure if any heart disease in his family.    Social History   Tobacco Use  . Smoking status: Former Smoker    Years: 50.00    Types: Cigarettes    Quit date: 11/23/1999    Years since quitting: 20.0  . Smokeless tobacco: Never Used  Substance Use Topics  . 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 Drug use: No    Home Medications Prior to Admission medications   Medication Sig Start Date End Date Taking? Authorizing Provider  albuterol (PROVENTIL HFA;VENTOLIN HFA) 108 (90 Base) MCG/ACT inhaler Inhale 1 puff into the lungs every 6 (six) hours as needed for wheezing or shortness of  breath.    [provider]  apixaban (ELIQUIS) 2.5 MG TABS tablet Take 1 tablet (2.5 mg total) by mouth 2 (two) times daily. Resume Sunday with the EVENING dose. 11/11/19   Shirley Friar, PA-C  aspirin EC 81 MG tablet Take 81 mg by mouth daily.    [provider]  BREO ELLIPTA 100-25 MCG/INH AEPB Inhale 1 puff into the lungs daily.  07/07/18   [provider]  dorzolamide-timolol (COSOPT) 22.3-6.8 MG/ML ophthalmic solution Place 1 drop into both eyes daily.  02/16/18   [provider]  FLUAD 0.5 ML SUSY ADMINISTER 0.5ML IN THE MUSCLE AS DIRECTED 08/17/18   [provider]  furosemide (LASIX) 20 MG tablet Take 1 tablet (20 mg total) by mouth daily. 12/05/19   Troy Sine, MD  Iron-Vitamins (GERITOL) LIQD Take 10 mLs by mouth daily.    [provider]  isosorbide mononitrate (IMDUR) 30 MG 24 hr tablet TAKE 1 TABLET(30 MG) BY MOUTH DAILY Patient taking differently: Take 30 mg by mouth daily. ** DO NOT CRUSH ** 06/21/19   Troy Sine, MD  LUMIGAN 0.01 % SOLN Place 1 drop into both eyes at bedtime.  12/06/14   [provider]  metoprolol succinate (TOPROL-XL) 100 MG 24 hr tablet Take 1 tablet (100 mg total) by mouth daily. Take with or immediately following a meal. 11/10/19   Georgette Shell, MD  nitroGLYCERIN (NITROSTAT) 0.4 MG SL tablet Place 1 tablet (0.4 mg total) under the tongue every 5 (five) minutes as needed for chest pain. 01/16/18   Troy Sine, MD  pravastatin (PRAVACHOL) 40 MG tablet Take 40 mg by mouth daily.  10/02/19   [provider]  RESTASIS 0.05 % ophthalmic emulsion Place 1 drop into both eyes daily. 12/30/14   [provider]  spironolactone (ALDACTONE) 25 MG tablet Take 0.5 tablets (12.5 mg total) by mouth daily. 11/09/19   Georgette Shell, MD    Allergies    Patient has no known allergies.  Review of Systems   Review of Systems  Constitutional: Negative for activity change, appetite change, diaphoresis, fatigue and fever.  HENT: Negative for congestion.   Respiratory: Positive for shortness of breath. Negative for cough and chest tightness.   Cardiovascular: Negative for chest pain.  Gastrointestinal: Negative for abdominal pain, nausea and vomiting.  Genitourinary: Negative for dysuria and hematuria.  Musculoskeletal: Negative for arthralgias and myalgias.  Skin: Negative for rash.  Neurological: Negative for dizziness, weakness and headaches.   all other systems are negative except as noted in the HPI and PMH.    Physical Exam Updated Vital Signs BP 140/73 (BP Location: Left Arm)   Pulse 71   Temp 98.7 F (37.1 C) (Oral)   Resp 14   SpO2 98%   Physical Exam Vitals and nursing note reviewed.  Constitutional:      General: He is not in acute distress.    Appearance: He is well-developed.  HENT:     Head: Normocephalic and atraumatic.     Mouth/Throat:     Pharynx: No oropharyngeal exudate.  Eyes:     Conjunctiva/sclera: Conjunctivae  normal.     Pupils: Pupils are equal, round, and reactive to light.  Neck:     Comments: No meningismus. Cardiovascular:     Rate and Rhythm: Normal rate and regular rhythm.     Heart sounds: Normal heart sounds. No murmur.     Comments: AICD in place  Reproducible left  lateral rib tenderness, no ecchymosis or crepitance Pulmonary:     Effort: Pulmonary effort is normal. No respiratory distress.     Breath sounds: Normal breath sounds.  Abdominal:     Palpations: Abdomen is soft.     Tenderness: There is abdominal tenderness. There is no guarding or rebound.     Comments: Left upper quadrant abdominal tenderness  Musculoskeletal:        General: No tenderness. Normal range of motion.     Cervical back: Normal range of motion and neck supple.     Comments: No T or L-spine tenderness  Skin:    General: Skin is warm.  Neurological:     Mental Status: He is alert and oriented to person, place, and time.     Cranial Nerves: No cranial nerve deficit.     Motor: No abnormal muscle tone.     Coordination: Coordination normal.     Comments: No ataxia on finger to nose bilaterally. No pronator drift. 5/5 strength throughout. CN 2-12 intact.Equal grip strength. Sensation intact.   Psychiatric:        Behavior: Behavior normal.     ED Results / Procedures / Treatments   Labs (all labs ordered are listed, but only abnormal results are displayed) Labs Reviewed  CBC WITH DIFFERENTIAL/PLATELET - Abnormal; Notable for the following components:      Result Value   WBC 3.0 (*)    Platelets 77 (*)    All other components within normal limits  BASIC METABOLIC PANEL - Abnormal; Notable for the following components:   Creatinine, Ser 1.80 (*)    Calcium 8.6 (*)    GFR calc non Af Amer 35 (*)    GFR calc Af Amer 40 (*)    All other components within normal limits    EKG EKG Interpretation  Date/Time:  Wednesday December 19 2019 13:12:15 EST Ventricular Rate:  83 PR Interval:    QRS  Duration: 150 QT Interval:  442 QTC Calculation: 519 R Axis:   -102 Text Interpretation: Ventricular-paced rhythm with frequent Premature ventricular complexes Abnormal ECG Confirmed by Pattricia Boss [phone]) on 12/19/2019 2:00:15 PM   Radiology DG Ribs Unilateral W/Chest Left  Result Date: 12/19/2019 CLINICAL DATA:  Persistent left rib pain since fall 3 weeks ago. EXAM: LEFT RIBS AND CHEST - 3+ VIEW COMPARISON:  Chest x-ray dated November 09, 2019. FINDINGS: Acute nondisplaced fractures of the left anterolateral eighth and ninth ribs. Unchanged left chest wall pacemaker. The heart size and mediastinal contours are within normal limits. Normal pulmonary vascularity. Mildly coarsened interstitial lung markings are similar to prior study. No focal consolidation, pleural effusion, or pneumothorax. IMPRESSION: 1. Acute nondisplaced fractures of the left anterolateral eighth and ninth ribs. 2.  No active cardiopulmonary disease. Electronically Signed   By: Titus Dubin M.D.   On: 12/19/2019 13:36    Procedures Procedures (including critical care time)  Medications Ordered in ED Medications  HYDROcodone-acetaminophen (NORCO/VICODIN) 5-325 MG per tablet 1 tablet (has no administration in time range)    ED Course  I have reviewed the triage vital signs and the nursing notes.  Pertinent labs & imaging results that were available during my care of the patient were reviewed by me and considered in my medical decision making (see chart for details).    MDM Rules/Calculators/A&P                      Patient with rib pain after mechanical fall 2  weeks ago.  He is awake and alert.  No hypoxia.  X-ray shows fractures of eighth and ninth ribs on the left without pneumothorax.  Patient does have recent admission for V. tach induced syncope and has an AICD in place.  He is anticoagulated  With his anticoagulation, will proceed with CT imaging to rule out underlying traumatic injury.  Will interrogate  AICD but patient describes mechanical fall rather than syncope.   Care transferred to Dr. Sedonia Small at shift change.  Final Clinical Impression(s) / ED Diagnoses Final diagnoses:  Closed fracture of multiple ribs of left side, initial encounter    Rx / DC Orders ED Discharge Orders         Ordered    HYDROcodone-acetaminophen (NORCO/VICODIN) 5-325 MG tablet  Every 4 hours PRN,   Status:  Discontinued     12/19/19 1632    HYDROcodone-acetaminophen (NORCO/VICODIN) 5-325 MG tablet  Every 4 hours PRN     12/19/19 1633           Ezequiel Essex, MD 12/19/19 1636

## 2019-12-19 NOTE — ED Triage Notes (Signed)
Pt states 3 weeks ago he was stepping out of the tub and tripped falling on the left side. Pt states seen then he has had left sided rib pain. Pt states he put a brace on which did help pain but still having rib pain. Pt denies any CP or SOB. Pt is ambulatory. Pt denies hitting head or loc with fall.

## 2019-12-21 DIAGNOSIS — S92302A Fracture of unspecified metatarsal bone(s), left foot, initial encounter for closed fracture: Secondary | ICD-10-CM | POA: Diagnosis not present

## 2019-12-21 DIAGNOSIS — I110 Hypertensive heart disease with heart failure: Secondary | ICD-10-CM | POA: Diagnosis not present

## 2019-12-21 DIAGNOSIS — S2242XA Multiple fractures of ribs, left side, initial encounter for closed fracture: Secondary | ICD-10-CM | POA: Diagnosis not present

## 2019-12-21 DIAGNOSIS — I10 Essential (primary) hypertension: Secondary | ICD-10-CM | POA: Diagnosis not present

## 2019-12-26 ENCOUNTER — Other Ambulatory Visit: Payer: Self-pay

## 2019-12-26 ENCOUNTER — Ambulatory Visit (INDEPENDENT_AMBULATORY_CARE_PROVIDER_SITE_OTHER): Payer: Medicare Other | Admitting: Podiatry

## 2019-12-26 DIAGNOSIS — S82892S Other fracture of left lower leg, sequela: Secondary | ICD-10-CM

## 2019-12-26 DIAGNOSIS — S92302A Fracture of unspecified metatarsal bone(s), left foot, initial encounter for closed fracture: Secondary | ICD-10-CM | POA: Diagnosis not present

## 2019-12-30 NOTE — Progress Notes (Signed)
   HPI: 79 y.o. male presenting today for follow up evaluation of a fractured left fibula and 5th metatarsal. He states he is improving. He has been resting the foot as much as possible for treatment. He has been taking OTC Tylenol for pain. There are no worsening factors noted. Patient is here for further evaluation and treatment.   Past Medical History:  Diagnosis Date  . CAD (coronary artery disease) 80% stenosis diag of the LAD, 30% in OM2 branch of LCX in 2009    a. Nonobstructive CAD by cath 11/2011 with the exception of the pre-existing diagonal branch #2 lesion.  . Chronic systolic CHF (congestive heart failure) ([place])   . CKD (chronic kidney disease) stage 3, GFR 30-59 ml/min   . Colon polyp, hyperplastic   . History of stress test 06/01/2012   Normal myocardial perfusion study. compared to the previous study there is no significant change. this is a low risk scan  . Hypertension   . Legally blind    "both eyes"  . Myocardial infarction ([place]) 11/22/11  . NICM (nonischemic cardiomyopathy) ([place])    a. Remote hx of dilated NICM with EF ranging 20-45%, including normal EF by echo (55-60%) in 2014.  Marland Kitchen Peripheral arterial disease ([place])    a. 06/2014: ABI right 0.99, left 1.2, LE dopplers revealing an occluded right posterior tibial. As symptoms were not felt r/t claudication, no further w/u at the time.  Marland Kitchen PVC's (premature ventricular contractions)   . 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 Requiring discontinuation of BB dose.  Marland Kitchen Spondylolisthesis   . Ventricular bigeminy   . Ventricular tachycardia (paroxysmal) ([place]) 04/11/2015     Physical Exam: General: The patient is alert and oriented x3 in no acute distress.  Dermatology: Skin is warm, dry and supple bilateral lower extremities. Negative for open lesions or macerations.  Vascular: Palpable pedal pulses bilaterally. No edema or erythema noted. Capillary refill within normal limits.  Neurological: Epicritic and protective  threshold grossly intact bilaterally.   Musculoskeletal Exam: Pain with palpation noted to the lateral left ankle as well as to the 5th metatarsal of the left foot. Range of motion within normal limits to all pedal and ankle joints bilateral. Muscle strength 5/5 in all groups bilateral.    Assessment: 1. Closed, minimally displaced fibula left with routine healing  2. Closed, minimally displaced fracture of the 5th metatarsal diaphysis left with routine healing  3. H/o fall DOI: 11/06/2019   Plan of Care:  1. Patient evaluated.  2. Begin weightbearing in CAM boot.  3. Continue taking OTC Tylenol as needed.  4. Return to clinic in 4 weeks for follow up X-Ray.       Edrick Kins, DPM [facility]  Dr. Edrick Kins, DPM    [address], [place] [address]                Office [phone]  Fax [phone]

## 2020-01-23 ENCOUNTER — Ambulatory Visit (INDEPENDENT_AMBULATORY_CARE_PROVIDER_SITE_OTHER): Payer: Medicare Other

## 2020-01-23 ENCOUNTER — Encounter: Payer: Self-pay | Admitting: Podiatry

## 2020-01-23 ENCOUNTER — Ambulatory Visit (INDEPENDENT_AMBULATORY_CARE_PROVIDER_SITE_OTHER): Payer: Medicare Other | Admitting: Podiatry

## 2020-01-23 ENCOUNTER — Other Ambulatory Visit: Payer: Self-pay

## 2020-01-23 DIAGNOSIS — S92355D Nondisplaced fracture of fifth metatarsal bone, left foot, subsequent encounter for fracture with routine healing: Secondary | ICD-10-CM

## 2020-01-23 DIAGNOSIS — S82892S Other fracture of left lower leg, sequela: Secondary | ICD-10-CM

## 2020-01-23 NOTE — Progress Notes (Signed)
   HPI: 79 y.o. male presenting today for follow up evaluation of a fractured left fibula and 5th metatarsal.  Patient states that he is feeling much better.  He denies any pain with ambulation.  He has been wearing the cam boot as instructed.   Past Medical History:  Diagnosis Date  . CAD (coronary artery disease) 80% stenosis diag of the LAD, 30% in OM2 branch of LCX in 2009    a. Nonobstructive CAD by cath 11/2011 with the exception of the pre-existing diagonal branch #2 lesion.  . Chronic systolic CHF (congestive heart failure) ([place])   . CKD (chronic kidney disease) stage 3, GFR 30-59 ml/min   . Colon polyp, hyperplastic   . History of stress test 06/01/2012   Normal myocardial perfusion study. compared to the previous study there is no significant change. this is a low risk scan  . Hypertension   . Legally blind    "both eyes"  . Myocardial infarction ([place]) 11/22/11  . NICM (nonischemic cardiomyopathy) ([place])    a. Remote hx of dilated NICM with EF ranging 20-45%, including normal EF by echo (55-60%) in 2014.  Marland Kitchen Peripheral arterial disease ([place])    a. 06/2014: ABI right 0.99, left 1.2, LE dopplers revealing an occluded right posterior tibial. As symptoms were not felt r/t claudication, no further w/u at the time.  Marland Kitchen PVC's (premature ventricular contractions)   . 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 Requiring discontinuation of BB dose.  Marland Kitchen Spondylolisthesis   . Ventricular bigeminy   . Ventricular tachycardia (paroxysmal) ([place]) 04/11/2015     Physical Exam: General: The patient is alert and oriented x3 in no acute distress.  Dermatology: Skin is warm, dry and supple bilateral lower extremities. Negative for open lesions or macerations.  Vascular: Palpable pedal pulses bilaterally. No edema or erythema noted. Capillary refill within normal limits.  Neurological: Epicritic and protective threshold grossly intact bilaterally.   Musculoskeletal Exam: Negative for pain with  palpation noted to the lateral left ankle as well as to the 5th metatarsal of the left foot. Range of motion within normal limits to all pedal and ankle joints bilateral. Muscle strength 5/5 in all groups bilateral.   Radiographic exam: Oblique fracture of the fibular malleolus noted with routine healing and evidence of healing.  Minimally displaced fracture of the fifth metatarsal with routine healing also.  Fractures appear stable with minimal displacement.   Assessment: 1. Closed, minimally displaced fibula left with routine healing  2. Closed, minimally displaced fracture of the 5th metatarsal diaphysis left with routine healing  3. H/o fall DOI: 11/06/2019   Plan of Care:  1. Patient evaluated.  2.  Patient may discontinue cam boot. 3.  Recommend transitioning into good supportive tennis shoes 4.  Return to clinic as needed   Edrick Kins, DPM [facility]  Dr. Edrick Kins, DPM    [address], [place] [address]                Office [phone]  Fax [phone]

## 2020-02-01 DIAGNOSIS — J4520 Mild intermittent asthma, uncomplicated: Secondary | ICD-10-CM | POA: Diagnosis not present

## 2020-02-01 DIAGNOSIS — I10 Essential (primary) hypertension: Secondary | ICD-10-CM | POA: Diagnosis not present

## 2020-02-01 DIAGNOSIS — Z950 Presence of cardiac pacemaker: Secondary | ICD-10-CM | POA: Diagnosis not present

## 2020-02-01 DIAGNOSIS — I450 Right fascicular block: Secondary | ICD-10-CM | POA: Diagnosis not present

## 2020-02-07 ENCOUNTER — Ambulatory Visit (INDEPENDENT_AMBULATORY_CARE_PROVIDER_SITE_OTHER): Payer: Medicare Other | Admitting: *Deleted

## 2020-02-07 DIAGNOSIS — I495 Sick sinus syndrome: Secondary | ICD-10-CM

## 2020-02-08 ENCOUNTER — Other Ambulatory Visit: Payer: Self-pay | Admitting: Cardiovascular Disease

## 2020-02-08 LAB — CUP PACEART REMOTE DEVICE CHECK
Battery Remaining Longevity: 54 mo
Battery Remaining Percentage: 93 %
Battery Voltage: 2.96 V
Brady Statistic AP VP Percent: 76 %
Brady Statistic AP VS Percent: 12 %
Brady Statistic AS VP Percent: 4.6 %
Brady Statistic AS VS Percent: 1 %
Brady Statistic RA Percent Paced: 81 %
Date Time Interrogation Session: 20210318030059
HighPow Impedance: 61 Ohm
Implantable Lead Implant Date: 20190821
Implantable Lead Implant Date: 20201217
Implantable Lead Implant Date: 20201217
Implantable Lead Location: 753858
Implantable Lead Location: 753859
Implantable Lead Location: 753860
Implantable Pulse Generator Implant Date: 20201217
Lead Channel Impedance Value: 380 Ohm
Lead Channel Impedance Value: 460 Ohm
Lead Channel Impedance Value: 510 Ohm
Lead Channel Pacing Threshold Amplitude: 0.5 V
Lead Channel Pacing Threshold Amplitude: 0.5 V
Lead Channel Pacing Threshold Amplitude: 1.25 V
Lead Channel Pacing Threshold Pulse Width: 0.4 ms
Lead Channel Pacing Threshold Pulse Width: 0.5 ms
Lead Channel Pacing Threshold Pulse Width: 0.5 ms
Lead Channel Sensing Intrinsic Amplitude: 1.2 mV
Lead Channel Sensing Intrinsic Amplitude: 12 mV
Lead Channel Setting Pacing Amplitude: 2 V
Lead Channel Setting Pacing Amplitude: 3.5 V
Lead Channel Setting Pacing Amplitude: 3.5 V
Lead Channel Setting Pacing Pulse Width: 0.5 ms
Lead Channel Setting Pacing Pulse Width: 0.5 ms
Lead Channel Setting Sensing Sensitivity: 0.5 mV
Pulse Gen Serial Number: 111014567

## 2020-02-08 MED ORDER — METOPROLOL SUCCINATE ER 100 MG PO TB24: 100.0000 mg | ORAL_TABLET | Freq: Every day | ORAL | 2 refills | Status: DC

## 2020-02-08 NOTE — Telephone Encounter (Signed)
*  STAT* If patient is at the pharmacy, call can be transferred to refill team.   1. Which medications need to be refilled? (please list name of each medication and dose if known)  metoprolol succinate (TOPROL-XL) 100 MG 24 hr tablet  2. Which pharmacy/location (including street and city if local pharmacy) is medication to be sent to?  [place], [place] - [address] AT [address]  3. Do they need a 30 day or 90 day supply? 30 with refills  Pt is out of medication

## 2020-02-20 ENCOUNTER — Ambulatory Visit (INDEPENDENT_AMBULATORY_CARE_PROVIDER_SITE_OTHER): Payer: Medicare Other | Admitting: Podiatry

## 2020-02-20 ENCOUNTER — Other Ambulatory Visit: Payer: Self-pay

## 2020-02-20 ENCOUNTER — Ambulatory Visit (INDEPENDENT_AMBULATORY_CARE_PROVIDER_SITE_OTHER): Payer: Medicare Other | Admitting: Internal Medicine

## 2020-02-20 ENCOUNTER — Encounter: Payer: Self-pay | Admitting: Internal Medicine

## 2020-02-20 ENCOUNTER — Encounter: Payer: Self-pay | Admitting: Podiatry

## 2020-02-20 VITALS — Temp 95.3°F

## 2020-02-20 VITALS — BP 132/80 | HR 79 | Ht 68.0 in | Wt 166.0 lb

## 2020-02-20 DIAGNOSIS — Z95810 Presence of automatic (implantable) cardiac defibrillator: Secondary | ICD-10-CM | POA: Diagnosis not present

## 2020-02-20 DIAGNOSIS — D689 Coagulation defect, unspecified: Secondary | ICD-10-CM | POA: Diagnosis not present

## 2020-02-20 DIAGNOSIS — I5032 Chronic diastolic (congestive) heart failure: Secondary | ICD-10-CM | POA: Diagnosis not present

## 2020-02-20 DIAGNOSIS — M79675 Pain in left toe(s): Secondary | ICD-10-CM

## 2020-02-20 DIAGNOSIS — I441 Atrioventricular block, second degree: Secondary | ICD-10-CM

## 2020-02-20 DIAGNOSIS — I472 Ventricular tachycardia: Secondary | ICD-10-CM

## 2020-02-20 DIAGNOSIS — I739 Peripheral vascular disease, unspecified: Secondary | ICD-10-CM | POA: Diagnosis not present

## 2020-02-20 DIAGNOSIS — M79674 Pain in right toe(s): Secondary | ICD-10-CM

## 2020-02-20 DIAGNOSIS — N1831 Chronic kidney disease, stage 3a: Secondary | ICD-10-CM

## 2020-02-20 DIAGNOSIS — B351 Tinea unguium: Secondary | ICD-10-CM

## 2020-02-20 DIAGNOSIS — I4729 Other ventricular tachycardia: Secondary | ICD-10-CM

## 2020-02-20 NOTE — Patient Instructions (Signed)
Medication Instructions:  Your physician recommends that you continue on your current medications as directed. Please refer to the Current Medication list given to you today.  Labwork: None ordered.  Testing/Procedures: None ordered.  Follow-Up: Your physician wants you to follow-up in: one year with Dr. Lovena Le.   You will receive a reminder letter in the mail two months in advance. If you don't receive a letter, please call our office to schedule the follow-up appointment.  Remote monitoring is used to monitor your ICD from home. This monitoring reduces the number of office visits required to check your device to one time per year. It allows [place] to keep an eye on the functioning of your device to ensure it is working properly. You are scheduled for a device check from home on 05/08/2020. You may send your transmission at any time that day. If you have a wireless device, the transmission will be sent automatically. After your physician reviews your transmission, you will receive a postcard with your next transmission date.  Any Other Special Instructions Will Be Listed Below (If Applicable).  If you need a refill on your cardiac medications before your next appointment, please call your pharmacy.

## 2020-02-20 NOTE — Progress Notes (Signed)
HPI [name] returns today for ongoing evaluation and management of his biv ICD. He is a pleasant 79 yo man with chronic systolic heart failure and CHB, s/p Biv ICD upgrade insertion do to the above. He underwent biv upgrade several months ago. He had a subtotally occluded SCV which was traversed with a glide wire.  No Known Allergies   Current Outpatient Medications  Medication Sig Dispense Refill  . albuterol (PROVENTIL HFA;VENTOLIN HFA) 108 (90 Base) MCG/ACT inhaler Inhale 1 puff into the lungs every 6 (six) hours as needed for wheezing or shortness of breath.    Marland Kitchen apixaban (ELIQUIS) 2.5 MG TABS tablet Take 1 tablet (2.5 mg total) by mouth 2 (two) times daily. Resume Sunday with the EVENING dose. 60 tablet 6  . aspirin EC 81 MG tablet Take 81 mg by mouth daily.    Marland Kitchen BREO ELLIPTA 100-25 MCG/INH AEPB Inhale 1 puff into the lungs daily.   2  . dorzolamide-timolol (COSOPT) 22.3-6.8 MG/ML ophthalmic solution Place 1 drop into both eyes daily.   4  . FLUAD 0.5 ML SUSY ADMINISTER 0.5ML IN THE MUSCLE AS DIRECTED  0  . FLUAD QUADRIVALENT 0.5 ML injection     . furosemide (LASIX) 20 MG tablet Take 1 tablet (20 mg total) by mouth daily. 30 tablet 11  . HYDROcodone-acetaminophen (NORCO/VICODIN) 5-325 MG tablet Take 1 tablet by mouth every 4 (four) hours as needed. 10 tablet 0  . Iron-Vitamins (GERITOL) LIQD Take 10 mLs by mouth daily.    . isosorbide mononitrate (IMDUR) 30 MG 24 hr tablet TAKE 1 TABLET(30 MG) BY MOUTH DAILY 90 tablet 0  . lidocaine (LIDODERM) 5 % Place 1 patch onto the skin daily. Remove & Discard patch within 12 hours or as directed by MD 5 patch 0  . LUMIGAN 0.01 % SOLN Place 1 drop into both eyes at bedtime.   0  . metoprolol succinate (TOPROL-XL) 100 MG 24 hr tablet Take 1 tablet (100 mg total) by mouth daily. Take with or immediately following a meal. 30 tablet 2  . nitroGLYCERIN (NITROSTAT) 0.4 MG SL tablet Place 1 tablet (0.4 mg total) under the tongue every 5 (five)  minutes as needed for chest pain. 25 tablet 5  . pravastatin (PRAVACHOL) 40 MG tablet Take 40 mg by mouth daily.     . RESTASIS 0.05 % ophthalmic emulsion Place 1 drop into both eyes daily.  11  . spironolactone (ALDACTONE) 25 MG tablet Take 0.5 tablets (12.5 mg total) by mouth daily. 30 tablet 8   No current facility-administered medications for this visit.     Past Medical History:  Diagnosis Date  . CAD (coronary artery disease) 80% stenosis diag of the LAD, 30% in OM2 branch of LCX in 2009    a. Nonobstructive CAD by cath 11/2011 with the exception of the pre-existing diagonal branch #2 lesion.  . Chronic systolic CHF (congestive heart failure) ([place])   . CKD (chronic kidney disease) stage 3, GFR 30-59 ml/min   . Colon polyp, hyperplastic   . History of stress test 06/01/2012   Normal myocardial perfusion study. compared to the previous study there is no significant change. this is a low risk scan  . Hypertension   . Legally blind    "both eyes"  . Myocardial infarction ([place]) 11/22/11  . NICM (nonischemic cardiomyopathy) ([place])    a. Remote hx of dilated NICM with EF ranging 20-45%, including normal EF by echo (55-60%) in 2014.  Marland Kitchen  Peripheral arterial disease ([place])    a. 06/2014: ABI right 0.99, left 1.2, LE dopplers revealing an occluded right posterior tibial. As symptoms were not felt r/t claudication, no further w/u at the time.  Marland Kitchen PVC's (premature ventricular contractions)   . 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 Requiring discontinuation of BB dose.  Marland Kitchen Spondylolisthesis   . Ventricular bigeminy   . Ventricular tachycardia (paroxysmal) ([place]) 04/11/2015    ROS:   All systems reviewed and negative except as noted in the HPI.   Past Surgical History:  Procedure Laterality Date  . BACK SURGERY    . BIV UPGRADE N/A 11/08/2019   Procedure: BIV UPGRADE;  Surgeon: Evans Lance, MD;  Location: [facility] [facility];  Service: Cardiovascular;  Laterality: N/A;  . CARDIAC  CATHETERIZATION  11/2011  . CARDIAC CATHETERIZATION  11/2011   didn't demonstrate high grade obstructive disease to account for his LV dysfunction.  Marland Kitchen CATARACT EXTRACTION, BILATERAL  1990's  . CYSTOSCOPY    . CYSTOSCOPY WITH URETHRAL DILATATION N/A 05/04/2013   Procedure: CYSTOSCOPY WITH URETHRAL DILATATION;  Surgeon: Eustace Moore, MD;  Location: [place] [facility];  Service: Neurosurgery;  Laterality: N/A;  with insertion of foley catheter  . EP study and ablation of VT  7/13   PVC focus mapped to the right coronary cusp of the aorta, limited ablation performed due to proximity of the focus to the right coronary artery  . EYE SURGERY    . LEFT HEART CATH AND CORONARY ANGIOGRAPHY N/A 11/07/2019   Procedure: LEFT HEART CATH AND CORONARY ANGIOGRAPHY;  Surgeon: Wellington Hampshire, MD;  Location: [place];  Service: Cardiovascular;  Laterality: N/A;  . LEFT HEART CATHETERIZATION WITH CORONARY ANGIOGRAM N/A 11/25/2011   Procedure: LEFT HEART CATHETERIZATION WITH CORONARY ANGIOGRAM;  Surgeon: Leonie Man, MD;  [facility];  Service: Cardiovascular;  Laterality: N/A;  . PACEMAKER IMPLANT N/A 07/12/2018   Procedure: PACEMAKER IMPLANT;  Surgeon: Sanda Klein, MD;  Location: [place] [facility];  Service: Cardiovascular;  Laterality: N/A;  . POSTERIOR FUSION [place]  . ROTATOR CUFF REPAIR  2000's   left  . V-TACH ABLATION N/A 06/06/2012   Procedure: V-TACH ABLATION;  Surgeon: Thompson Grayer, MD;  Location[facility] C[facility] LAB;  Service: Cardiovascular;  Laterality: N/A;     Family History  Problem Relation Age of Onset  . Asthma Mother   . Other Other        Unsure if any heart disease in his family.     Social History   Socioeconomic History  . Marital status: Legally Separated    Spouse name: Not on file  . Number of children: 3  . Years of education: Not on file  . Highest education level: Not on file  Occupational History  . Occupation: Retired    Fish farm manager: RETIRED    Tobacco Use  . Smoking status: Former Smoker    Years: 50.00    Types: Cigarettes    Quit date: 11/23/1999    Years since quitting: 20.2  . Smokeless tobacco: Never Used  Substance and Sexual Activity  . 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 Drug use: No  . Sexual activity: Yes  Other Topics Concern  . Not on file  Social History Narrative  . Not on file   Social Determinants of Health   Financial Resource  Strain:   . Difficulty of Paying Living Expenses:   Food Insecurity:   . Worried About Charity fundraiser in the Last Year:   . Arboriculturist in the Last Year:   Transportation Needs:   . Film/video editor (Medical):   Marland Kitchen Lack of Transportation (Non-Medical):   Physical Activity:   . Days of Exercise per Week:   . Minutes of Exercise per Session:   Stress:   . Feeling of Stress :   Social Connections:   . Frequency of Communication with Friends and Family:   . Frequency of Social Gatherings with Friends and Family:   . Attends Religious Services:   . Active Member of Clubs or Organizations:   . Attends Archivist Meetings:   Marland Kitchen Marital Status:   Intimate Partner Violence:   . Fear of Current or Ex-Partner:   . Emotionally Abused:   Marland Kitchen Physically Abused:   . Sexually Abused:      BP 132/80   Pulse 79   Ht 5\' 8"  (1.727 m)   Wt 166 lb (75.3 kg)   SpO2 97%   BMI 25.24 kg/m   Physical Exam:  Well appearing NAD HEENT: Unremarkable Neck:  No JVD, no thyromegally Lymphatics:  No adenopathy Back:  No CVA tenderness Lungs:  Clear with no wheezes HEART:  Regular rate rhythm, no murmurs, no rubs, no clicks Abd:  soft, positive bowel sounds, no organomegally, no rebound, no guarding Ext:  2 plus pulses, no edema, no cyanosis, no clubbing Skin:  No rashes no nodules Neuro:  CN II through XII intact, motor grossly intact  EKG - nsr with biv pacing and PVC's  DEVICE  Normal device  function.  See PaceArt for details.   Assess/Plan: 1. CHB - he is s/p biv ICD upgrade from a DDD PM. He is asymptomatic.  2. ICD -his Biv ICD appears to be working normally.  3. Chronic systolic heart failure - he appears to be euvolemic with class 2 symptoms.  4. PAF - he is maintaining NSR. He is no longer on amiodarone.   Mikle Bosworth.D.

## 2020-02-20 NOTE — Progress Notes (Signed)
This patient returns to my office for at risk foot care.  This patient requires this care by a professional since this patient will be at risk due to having coagulation disorder, PAD and  Chronic kidney disease.  Patient is taking eliquiss.  This patient is unable to cut nails himself since the patient cannot reach his nails.These nails are painful walking and wearing shoes.  This patient presents for at risk foot care today.  General Appearance  Alert, conversant and in no acute stress.  Vascular  Dorsalis pedis and posterior tibial  pulses are palpable  bilaterally.  Capillary return is within normal limits  bilaterally. Temperature is within normal limits  bilaterally.  Neurologic  Senn-Weinstein monofilament wire test within normal limits  bilaterally. Muscle power within normal limits bilaterally.  Nails Thick disfigured discolored nails with subungual debris  from hallux to fifth toes bilaterally. No evidence of bacterial infection or drainage bilaterally.  Orthopedic  No limitations of motion  feet .  No crepitus or effusions noted.  No bony pathology or digital deformities noted.  Skin  normotropic skin with no porokeratosis noted bilaterally.  No signs of infections or ulcers noted.     Onychomycosis  Pain in right toes  Pain in left toes  Consent was obtained for treatment procedures.   Mechanical debridement of nails 1-5  bilaterally performed with a nail nipper.  Filed with dremel without incident.    Return office visit   3 months                   Told patient to return for periodic foot care and evaluation due to potential at risk complications.   Gardiner Barefoot DPM

## 2020-03-31 DIAGNOSIS — H35033 Hypertensive retinopathy, bilateral: Secondary | ICD-10-CM | POA: Diagnosis not present

## 2020-03-31 DIAGNOSIS — H401133 Primary open-angle glaucoma, bilateral, severe stage: Secondary | ICD-10-CM | POA: Diagnosis not present

## 2020-03-31 DIAGNOSIS — Z961 Presence of intraocular lens: Secondary | ICD-10-CM | POA: Diagnosis not present

## 2020-03-31 DIAGNOSIS — H04123 Dry eye syndrome of bilateral lacrimal glands: Secondary | ICD-10-CM | POA: Diagnosis not present

## 2020-04-16 ENCOUNTER — Emergency Department (HOSPITAL_COMMUNITY)
Admission: EM | Admit: 2020-04-16 | Discharge: 2020-04-16 | Disposition: A | Discharge status: Home / Self Care | Payer: Medicare Other | Attending: Emergency Medicine | Admitting: Emergency Medicine

## 2020-04-16 DIAGNOSIS — Z7901 Long term (current) use of anticoagulants: Secondary | ICD-10-CM | POA: Diagnosis not present

## 2020-04-16 DIAGNOSIS — Z95811 Presence of heart assist device: Secondary | ICD-10-CM | POA: Diagnosis not present

## 2020-04-16 DIAGNOSIS — I5022 Chronic systolic (congestive) heart failure: Secondary | ICD-10-CM | POA: Diagnosis not present

## 2020-04-16 DIAGNOSIS — Z7982 Long term (current) use of aspirin: Secondary | ICD-10-CM | POA: Diagnosis not present

## 2020-04-16 DIAGNOSIS — R040 Epistaxis: Secondary | ICD-10-CM | POA: Diagnosis not present

## 2020-04-16 DIAGNOSIS — Z743 Need for continuous supervision: Secondary | ICD-10-CM | POA: Diagnosis not present

## 2020-04-16 DIAGNOSIS — I252 Old myocardial infarction: Secondary | ICD-10-CM | POA: Insufficient documentation

## 2020-04-16 DIAGNOSIS — I10 Essential (primary) hypertension: Secondary | ICD-10-CM | POA: Diagnosis not present

## 2020-04-16 DIAGNOSIS — N183 Chronic kidney disease, stage 3 unspecified: Secondary | ICD-10-CM | POA: Insufficient documentation

## 2020-04-16 DIAGNOSIS — I130 Hypertensive heart and chronic kidney disease with heart failure and stage 1 through stage 4 chronic kidney disease, or unspecified chronic kidney disease: Secondary | ICD-10-CM | POA: Diagnosis not present

## 2020-04-16 DIAGNOSIS — Z87891 Personal history of nicotine dependence: Secondary | ICD-10-CM | POA: Diagnosis not present

## 2020-04-16 DIAGNOSIS — I2510 Atherosclerotic heart disease of native coronary artery without angina pectoris: Secondary | ICD-10-CM | POA: Diagnosis not present

## 2020-04-16 DIAGNOSIS — Z79899 Other long term (current) drug therapy: Secondary | ICD-10-CM | POA: Diagnosis not present

## 2020-04-16 DIAGNOSIS — R58 Hemorrhage, not elsewhere classified: Secondary | ICD-10-CM | POA: Diagnosis not present

## 2020-04-16 DIAGNOSIS — G4489 Other headache syndrome: Secondary | ICD-10-CM | POA: Diagnosis not present

## 2020-04-16 NOTE — Discharge Instructions (Signed)
You have been seen today for a nosebleed. Please read and follow all provided instructions. Return to the emergency room for worsening condition or new concerning symptoms.    Thankfully her nose stopped bleeding and did not bleed in the emergency department.  If it starts to bleed again please apply pressure.  If you are unable to stop the bleeding you can always return to the emergency department.  1. Medications:  Continue usual home medications Take medications as prescribed. Please review all of the medicines and only take them if you do not have an allergy to them.   2. Treatment: rest, drink plenty of fluids  3. Follow Up: -Follow-up with the ear nose and throat doctors.  I have given you the contact information for [facility].  Call their office to schedule the next available follow-up appointment  -Please follow up with primary care provider by scheduling an appointment as soon as possible for a visit     ?

## 2020-04-16 NOTE — ED Provider Notes (Signed)
[place] EMERGENCY DEPARTMENT Provider Note   CSN: [id number] Arrival date & time: 04/16/20  1713     History Chief Complaint  Patient presents with  . Epistaxis    [name] is a 79 y.o. male with past medical history significant for CAD, chronic systolic CHF, CKD stage III, hypertension, nonischemic cardiomyopathy, peripheral arterial disease, paroxysmal V. tach presents to emergency department today via EMS with chief complaint of epistaxis.  Patient states when he got out of the shower today at 4:15 PM he noticed his nose was bleeding.  He applied pressure and the bleeding ceased.  His roommates were concerned and called 911.  He denies being in any pain.  Denies any fall or facial trauma.  Patient states he had a history of nosebleeds approximately 6 months ago however was never evaluated for them.  He denies being on any anticoagulation.      Past Medical History:  Diagnosis Date  . CAD (coronary artery disease) 80% stenosis diag of the LAD, 30% in OM2 branch of LCX in 2009    a. Nonobstructive CAD by cath 11/2011 with the exception of the pre-existing diagonal branch #2 lesion.  . Chronic systolic CHF (congestive heart failure) ([place])   . CKD (chronic kidney disease) stage 3, GFR 30-59 ml/min   . Colon polyp, hyperplastic   . History of stress test 06/01/2012   Normal myocardial perfusion study. compared to the previous study there is no significant change. this is a low risk scan  . Hypertension   . Legally blind    "both eyes"  . Myocardial infarction ([place]) 11/22/11  . NICM (nonischemic cardiomyopathy) ([place])    a. Remote hx of dilated NICM with EF ranging 20-45%, including normal EF by echo (55-60%) in 2014.  Marland Kitchen Peripheral arterial disease ([place])    a. 06/2014: ABI right 0.99, left 1.2, LE dopplers revealing an occluded right posterior tibial. As symptoms were not felt r/t claudication, no further w/u at the time.  Marland Kitchen PVC's (premature ventricular  contractions)   . 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 Requiring discontinuation of BB dose.  Marland Kitchen Spondylolisthesis   . Ventricular bigeminy   . Ventricular tachycardia (paroxysmal) ([place]) 04/11/2015    Patient Active Problem List   Diagnosis Date Noted  . ICD (implantable cardioverter-defibrillator) in place   . Cardiac syncope 11/07/2019  . Closed fracture of distal fibula 11/07/2019  . Pain due to onychomycosis of toenails of both feet 05/16/2019  . Coagulation disorder ([place]) 05/16/2019  . Paroxysmal atrial fibrillation ([place]) 08/02/2018  . NSVT (nonsustained ventricular tachycardia) ([place]) 08/02/2018  . Symptomatic bradycardia 07/12/2018  . Pacemaker 07/12/2018  . Tachycardia-bradycardia syndrome ([place])   . Unsteady gait 03/12/2016  . Nausea vomiting and diarrhea 03/11/2016  . Acute lower UTI 03/11/2016  . Gastroenteritis 03/11/2016  . Obstipation 03/11/2016  . Abdominal pain   . Constipation   . Lactic acidosis   . Leg pain   . Essential hypertension 03/04/2016  . Ventricular tachycardia (paroxysmal) ([place]) 04/11/2015  . Chest pain with moderate risk for cardiac etiology 04/09/2015  . CAP (community acquired pneumonia) 02/18/2015  . Chest pain at rest 02/18/2015  . Peripheral arterial disease ([place]) 08/15/2014  . Hyperlipidemia with target LDL less than 70 01/01/2014  . GERD (gastroesophageal reflux disease) 01/01/2014  . First degree AV block 07/31/2012  . Unstable angina, negative MI 06/05/2012  . Second degree AV block, Mobitz type I 05/27/2012  . CKD (chronic  kidney disease) stage 3, GFR 30-59 ml/min 05/27/2012  . 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 Chronic diastolic CHF (congestive heart failure) ([place]) 11/23/2011    Class: Diagnosis of  . Coronary artery disease involving native coronary artery of native heart without angina pectoris 11/22/2011  . Premature ventricular  contractions 11/22/2011  . Esophageal reflux 07/28/2011    Past Surgical History:  Procedure Laterality Date  . BACK SURGERY    . BIV UPGRADE N/A 11/08/2019   Procedure: BIV UPGRADE;  Surgeon: Evans Lance, MD;  Location: [place] [facility];  Service: Cardiovascular;  Laterality: N/A;  . CARDIAC CATHETERIZATION  11/2011  . CARDIAC CATHETERIZATION  11/2011   didn't demonstrate high grade obstructive disease to account for his LV dysfunction.  Marland Kitchen CATARACT EXTRACTION, BILATERAL  1990's  . CYSTOSCOPY    . CYSTOSCOPY WITH URETHRAL DILATATION N/A 05/04/2013   Procedure: CYSTOSCOPY WITH URETHRAL DILATATION;  Surgeon: Eustace Moore, MD;  Location: [place] [facility];  Service: Neurosurgery;  Laterality: N/A;  with insertion of foley catheter  . EP study and ablation of VT  7/13   PVC focus mapped to the right coronary cusp of the aorta, limited ablation performed due to proximity of the focus to the right coronary artery  . EYE SURGERY    . LEFT HEART CATH AND CORONARY ANGIOGRAPHY N/A 11/07/2019   Procedure: LEFT HEART CATH AND CORONARY ANGIOGRAPHY;  Surgeon: Wellington Hampshire, MD;  Location: [place] [facility];  Service: Cardiovascular;  Laterality: N/A;  . LEFT HEART CATHETERIZATION WITH CORONARY ANGIOGRAM N/A 11/25/2011   Procedure: LEFT HEART CATHETERIZATION WITH CORONARY ANGIOGRAM;  Surgeon: Leonie Man, MD;  Location: [facility] CATH LAB;  Service: Cardiovascular;  Laterality: N/A;  . PACEMAKER IMPLANT N/A 07/12/2018   Procedure: PACEMAKER IMPLANT;  Surgeon: Sanda Klein, MD;  Location: [place] [facility];  Service: Cardiovascular;  Laterality: N/A;  . POSTERIOR FUSION [place]  . ROTATOR CUFF REPAIR  2000's   left  . V-TACH ABLATION N/A 06/06/2012   Procedure: V-TACH ABLATION;  Surgeon: Thompson Grayer, MD;  Location: [facility] CATH LAB;  Service: Cardiovascular;  Laterality: N/A;       Family History  Problem Relation Age of Onset  . Asthma Mother   . Other Other        Unsure if any heart  disease in his family.    Social History   Tobacco Use  . Smoking status: Former Smoker    Years: 50.00    Types: Cigarettes    Quit date: 11/23/1999    Years since quitting: 20.4  . Smokeless tobacco: Never Used  Substance Use Topics  . 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 Drug use: No    Home Medications Prior to Admission medications   Medication Sig Start Date End Date Taking? Authorizing Provider  albuterol (PROVENTIL HFA;VENTOLIN HFA) 108 (90 Base) MCG/ACT inhaler Inhale 1 puff into the lungs every 6 (six) hours as needed for wheezing or shortness of breath.    [provider]  apixaban (ELIQUIS) 2.5 MG TABS tablet Take 1 tablet (2.5 mg total) by mouth 2 (two) times daily. Resume Sunday with the EVENING dose. 11/11/19   Shirley Friar, PA-C  aspirin EC 81 MG tablet Take 81 mg by mouth daily.    [provider]  [place] Patter  100-25 MCG/INH AEPB Inhale 1 puff into the lungs daily.  07/07/18   [provider]  dorzolamide-timolol (COSOPT) 22.3-6.8 MG/ML ophthalmic solution Place 1 drop into both eyes daily.  02/16/18   [provider]  FLUAD 0.5 ML SUSY ADMINISTER 0.5ML IN THE MUSCLE AS DIRECTED 08/17/18   [provider]  FLUAD QUADRIVALENT 0.5 ML injection  09/11/19   [provider]  furosemide (LASIX) 20 MG tablet Take 1 tablet (20 mg total) by mouth daily. 12/05/19   Troy Sine, MD  HYDROcodone-acetaminophen (NORCO/VICODIN) 5-325 MG tablet Take 1 tablet by mouth every 4 (four) hours as needed. 12/19/19   Rancour, Annie Main, MD  Iron-Vitamins (GERITOL) LIQD Take 10 mLs by mouth daily.    [provider]  isosorbide mononitrate (IMDUR) 30 MG 24 hr tablet TAKE 1 TABLET(30 MG) BY MOUTH DAILY 06/21/19   Troy Sine, MD  lidocaine (LIDODERM) 5 % Place 1 patch onto the skin daily. Remove & Discard patch within 12 hours or as directed by MD 12/19/19   Maudie Flakes, MD  LUMIGAN 0.01 % SOLN Place 1 drop into both eyes at bedtime.  12/06/14   [provider]  metoprolol succinate (TOPROL-XL) 100 MG 24 hr tablet Take 1 tablet (100 mg total) by mouth daily. Take with or immediately following a meal. 02/08/20   Troy Sine, MD  nitroGLYCERIN (NITROSTAT) 0.4 MG SL tablet Place 1 tablet (0.4 mg total) under the tongue every 5 (five) minutes as needed for chest pain. 01/16/18   Troy Sine, MD  pravastatin (PRAVACHOL) 40 MG tablet Take 40 mg by mouth daily.  10/02/19   [provider]  RESTASIS 0.05 % ophthalmic emulsion Place 1 drop into both eyes daily. 12/30/14   [provider]  spironolactone (ALDACTONE) 25 MG tablet Take 0.5 tablets (12.5 mg total) by mouth daily. 11/09/19   Georgette Shell, MD    Allergies    Patient has no known allergies.  Review of Systems   Review of Systems All other systems are reviewed and are negative for acute change except as noted in the HPI.  Physical Exam Updated Vital Signs BP (!) 164/94 (BP Location: Right Arm)   Pulse 69   Temp 98.2 F (36.8 C) (Oral)   Resp 18   SpO2 98%   Physical Exam Vitals and nursing note reviewed.  Constitutional:      Appearance: He is well-developed. He is not ill-appearing or toxic-appearing.  HENT:     Head: Normocephalic and atraumatic.     Nose: Nose normal. No nasal tenderness.     Right Nostril: No epistaxis, septal hematoma or occlusion.     Left Nostril: No epistaxis, septal hematoma or occlusion.     Right Turbinates: Not enlarged or swollen.     Left Turbinates: Not enlarged or swollen.     Mouth/Throat:     Mouth: Mucous membranes are moist.     Pharynx: Oropharynx is clear.  Eyes:     General: No scleral icterus.       Right eye: No discharge.        Left eye: No discharge.     Conjunctiva/sclera: Conjunctivae normal.  Neck:     Vascular: No JVD.  Cardiovascular:     Rate and Rhythm: Normal rate and regular rhythm.      Pulses: Normal pulses.          Radial pulses are 2+ on the right side and 2+  on the left side.     Heart sounds: Normal heart sounds.  Pulmonary:     Effort: Pulmonary effort is normal.     Breath sounds: Normal breath sounds.  Abdominal:     General: There is no distension.  Musculoskeletal:        General: Normal range of motion.     Cervical back: Normal range of motion.  Skin:    General: Skin is warm and dry.  Neurological:     Mental Status: He is oriented to person, place, and time.     GCS: GCS eye subscore is 4. GCS verbal subscore is 5. GCS motor subscore is 6.     Comments: Fluent speech, no facial droop.  Psychiatric:        Behavior: Behavior normal.     ED Results / Procedures / Treatments   Labs (all labs ordered are listed, but only abnormal results are displayed) Labs Reviewed - No data to display  EKG None  Radiology No results found.  Procedures Procedures (including critical care time)  Medications Ordered in ED Medications - No data to display  ED Course  I have reviewed the triage vital signs and the nursing notes.  Pertinent labs & imaging results that were available during my care of the patient were reviewed by me and considered in my medical decision making (see chart for details). Vitals:   04/16/20 1718  BP: (!) 164/94  Pulse: 69  Resp: 18  Temp: 98.2 F (36.8 C)  TempSrc: Oral  SpO2: 98%      MDM Rules/Calculators/A&P                      History provided by patient with additional history obtained from chart review.    Patient seen and examined. Patient presents awake, alert, hemodynamically stable, afebrile, non toxic.  On exam patient is very well-appearing.  There has been no further epistaxis since he applied pressure at home.  I can see the source of bleeding in the anterior right nare.  The area is scabbed over.  Patient has blown his nose multiple times during the exam and there has been no further bleeding. Findings  and plan of care discussed with supervising physician Dr. Rogene Houston who agrees with plan to discharge patient home without any emergent interventions needed to be performed.  The patient appears reasonably screened and/or stabilized for discharge and I doubt any other medical condition or other [place] requiring further screening, evaluation, or treatment in the ED at this time prior to discharge. The patient is safe for discharge with strict return precautions discussed. Recommend pcp follow up as well as ENT if symptoms persist.   Portions of this note were generated with Dragon dictation software. Dictation errors may occur despite best attempts at proofreading.   Final Clinical Impression(s) / ED Diagnoses Final diagnoses:  Epistaxis    Rx / DC Orders ED Discharge Orders    None       Cherre Robins, PA-C 04/16/20 1926    Fredia Sorrow, MD 04/17/20 1538

## 2020-04-16 NOTE — ED Triage Notes (Signed)
Pt had sudden onset of nose bleed that started at 415pm after getting out of shower- stopped with direct pressure. No bleeding on arrival to ED.   168/92 74HR 22RR 100% RA with ems.

## 2020-04-17 ENCOUNTER — Emergency Department (HOSPITAL_COMMUNITY)
Admission: EM | Admit: 2020-04-17 | Discharge: 2020-04-18 | Disposition: A | Discharge status: Home / Self Care | Payer: Medicare Other | Attending: Emergency Medicine | Admitting: Emergency Medicine

## 2020-04-17 ENCOUNTER — Encounter (HOSPITAL_COMMUNITY): Payer: Self-pay

## 2020-04-17 ENCOUNTER — Other Ambulatory Visit: Payer: Self-pay

## 2020-04-17 DIAGNOSIS — I2510 Atherosclerotic heart disease of native coronary artery without angina pectoris: Secondary | ICD-10-CM | POA: Diagnosis not present

## 2020-04-17 DIAGNOSIS — I10 Essential (primary) hypertension: Secondary | ICD-10-CM | POA: Diagnosis not present

## 2020-04-17 DIAGNOSIS — N183 Chronic kidney disease, stage 3 unspecified: Secondary | ICD-10-CM | POA: Insufficient documentation

## 2020-04-17 DIAGNOSIS — Z950 Presence of cardiac pacemaker: Secondary | ICD-10-CM | POA: Insufficient documentation

## 2020-04-17 DIAGNOSIS — I130 Hypertensive heart and chronic kidney disease with heart failure and stage 1 through stage 4 chronic kidney disease, or unspecified chronic kidney disease: Secondary | ICD-10-CM | POA: Diagnosis not present

## 2020-04-17 DIAGNOSIS — Z87891 Personal history of nicotine dependence: Secondary | ICD-10-CM | POA: Diagnosis not present

## 2020-04-17 DIAGNOSIS — Z743 Need for continuous supervision: Secondary | ICD-10-CM | POA: Diagnosis not present

## 2020-04-17 DIAGNOSIS — Z79899 Other long term (current) drug therapy: Secondary | ICD-10-CM | POA: Diagnosis not present

## 2020-04-17 DIAGNOSIS — R42 Dizziness and giddiness: Secondary | ICD-10-CM | POA: Diagnosis not present

## 2020-04-17 DIAGNOSIS — Z7982 Long term (current) use of aspirin: Secondary | ICD-10-CM | POA: Diagnosis not present

## 2020-04-17 DIAGNOSIS — R040 Epistaxis: Secondary | ICD-10-CM

## 2020-04-17 DIAGNOSIS — I5022 Chronic systolic (congestive) heart failure: Secondary | ICD-10-CM | POA: Insufficient documentation

## 2020-04-17 DIAGNOSIS — Z7901 Long term (current) use of anticoagulants: Secondary | ICD-10-CM | POA: Insufficient documentation

## 2020-04-17 NOTE — ED Triage Notes (Signed)
Per [facility] pt from home with an ongoing nose bleed. Pt seen here yesterday for the same, started again today 1530, bleeding controlled at this time.  Not on blood thinners     HTN 166/109 hx of the same

## 2020-04-18 ENCOUNTER — Encounter (HOSPITAL_COMMUNITY): Payer: Self-pay | Admitting: Emergency Medicine

## 2020-04-18 DIAGNOSIS — R040 Epistaxis: Secondary | ICD-10-CM | POA: Diagnosis not present

## 2020-04-18 MED ORDER — OXYMETAZOLINE HCL 0.05 % NA SOLN
1.0000 | Freq: Once | NASAL | Status: AC
  Administered 2020-04-18: 1 via NASAL
  Filled 2020-04-18: qty 30

## 2020-04-18 NOTE — ED Provider Notes (Signed)
[facility] EMERGENCY DEPARTMENT Provider Note   CSN: [id number] Arrival date & time: 04/17/20  1801     History Chief Complaint  Patient presents with  . Epistaxis    [name] is a 79 y.o. male.  HPI [name] is a 79 y.o. male with past medical history significant for CAD, chronic systolic CHF, CKD stage III, hypertension, nonischemic cardiomyopathy, peripheral arterial disease, paroxysmal V. tach presents to the ED with recurrent epistaxis. States that he applied a cold rag and it stopped but he decided to wait anyhow.   Seen yesterday for same has not been using saline or other nasal spray, no trauma.      Past Medical History:  Diagnosis Date  . CAD (coronary artery disease) 80% stenosis diag of the LAD, 30% in OM2 branch of LCX in 2009    a. Nonobstructive CAD by cath 11/2011 with the exception of the pre-existing diagonal branch #2 lesion.  . Chronic systolic CHF (congestive heart failure) ([place])   . CKD (chronic kidney disease) stage 3, GFR 30-59 ml/min   . Colon polyp, hyperplastic   . History of stress test 06/01/2012   Normal myocardial perfusion study. compared to the previous study there is no significant change. this is a low risk scan  . Hypertension   . Legally blind    "both eyes"  . Myocardial infarction ([place]) 11/22/11  . NICM (nonischemic cardiomyopathy) ([place])    a. Remote hx of dilated NICM with EF ranging 20-45%, including normal EF by echo (55-60%) in 2014.  Marland Kitchen Peripheral arterial disease ([place])    a. 06/2014: ABI right 0.99, left 1.2, LE dopplers revealing an occluded right posterior tibial. As symptoms were not felt r/t claudication, no further w/u at the time.  Marland Kitchen PVC's (premature ventricular contractions)   . 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 Requiring discontinuation of BB dose.  Marland Kitchen Spondylolisthesis   . Ventricular bigeminy   . Ventricular tachycardia (paroxysmal) ([place]) 04/11/2015    Patient Active Problem  List   Diagnosis Date Noted  . ICD (implantable cardioverter-defibrillator) in place   . Cardiac syncope 11/07/2019  . Closed fracture of distal fibula 11/07/2019  . Pain due to onychomycosis of toenails of both feet 05/16/2019  . Coagulation disorder ([place]) 05/16/2019  . Paroxysmal atrial fibrillation ([place]) 08/02/2018  . NSVT (nonsustained ventricular tachycardia) ([place]) 08/02/2018  . Symptomatic bradycardia 07/12/2018  . Pacemaker 07/12/2018  . Tachycardia-bradycardia syndrome ([place])   . Unsteady gait 03/12/2016  . Nausea vomiting and diarrhea 03/11/2016  . Acute lower UTI 03/11/2016  . Gastroenteritis 03/11/2016  . Obstipation 03/11/2016  . Abdominal pain   . Constipation   . Lactic acidosis   . Leg pain   . Essential hypertension 03/04/2016  . Ventricular tachycardia (paroxysmal) ([place]) 04/11/2015  . Chest pain with moderate risk for cardiac etiology 04/09/2015  . CAP (community acquired pneumonia) 02/18/2015  . Chest pain at rest 02/18/2015  . Peripheral arterial disease ([place]) 08/15/2014  . Hyperlipidemia with target LDL less than 70 01/01/2014  . GERD (gastroesophageal reflux disease) 01/01/2014  . First degree AV block 07/31/2012  . Unstable angina, negative MI 06/05/2012  . Second degree AV block, Mobitz type I 05/27/2012  . CKD (chronic kidney disease) stage 3, GFR 30-59 ml/min 05/27/2012  . 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 Chronic diastolic CHF (congestive heart failure) ([place]) 11/23/2011  Class: Diagnosis of  . Coronary artery disease involving native coronary artery of native heart without angina pectoris 11/22/2011  . Premature ventricular contractions 11/22/2011  . Esophageal reflux 07/28/2011    Past Surgical History:  Procedure Laterality Date  . BACK SURGERY    . BIV UPGRADE N/A 11/08/2019   Procedure: BIV UPGRADE;  Surgeon: Evans Lance, MD;  Location: [place] [facility];  Service: Cardiovascular;  Laterality: N/A;  . CARDIAC CATHETERIZATION  11/2011  . CARDIAC CATHETERIZATION  11/2011   didn't demonstrate high grade obstructive disease to account for his LV dysfunction.  Marland Kitchen CATARACT EXTRACTION, BILATERAL  1990's  . CYSTOSCOPY    . CYSTOSCOPY WITH URETHRAL DILATATION N/A 05/04/2013   Procedure: CYSTOSCOPY WITH URETHRAL DILATATION;  Surgeon: Eustace Moore, MD;  Location: [place] [facility];  Service: Neurosurgery;  Laterality: N/A;  with insertion of foley catheter  . EP study and ablation of VT  7/13   PVC focus mapped to the right coronary cusp of the aorta, limited ablation performed due to proximity of the focus to the right coronary artery  . EYE SURGERY    . LEFT HEART CATH AND CORONARY ANGIOGRAPHY N/A 11/07/2019   Procedure: LEFT HEART CATH AND CORONARY ANGIOGRAPHY;  Surgeon: Wellington Hampshire, MD;  Location: [place] [facility];  Service: Cardiovascular;  Laterality: N/A;  . LEFT HEART CATHETERIZATION WITH CORONARY ANGIOGRAM N/A 11/25/2011   Procedure: LEFT HEART CATHETERIZATION WITH CORONARY ANGIOGRAM;  Surgeon: Leonie Man, MD;  [facility] C[facility] LAB;  Service: Cardiovascular;  Laterality: N/A;  . PACEMAKER IMPLANT N/A 07/12/2018   Procedure: PACEMAKER IMPLANT;  Surgeon: Sanda Klein, MD;  Location: [place] [facility];  Service: Cardiovascular;  Laterality: N/A;  . POSTERIOR FUSION [place]  . ROTATOR CUFF REPAIR  2000's   left  . V-TACH ABLATION N/A 06/06/2012   Procedure: V-TACH ABLATION;  Surgeon: Thompson Grayer, MD;  Location: [facility] CATH LAB;  Service: Cardiovascular;  Laterality: N/A;       Family History  Problem Relation Age of Onset  . Asthma Mother   . Other Other        Unsure if any heart disease in his family.    Social History   Tobacco Use  . Smoking status: Former Smoker    Years: 50.00    Types: Cigarettes    Quit date: 11/23/1999    Years since quitting: 20.4  . Smokeless tobacco: Never Used  Substance Use Topics    . 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 Drug use: No    Home Medications Prior to Admission medications   Medication Sig Start Date End Date Taking? Authorizing Provider  albuterol (PROVENTIL HFA;VENTOLIN HFA) 108 (90 Base) MCG/ACT inhaler Inhale 1 puff into the lungs every 6 (six) hours as needed for wheezing or shortness of breath.    [provider]  apixaban (ELIQUIS) 2.5 MG TABS tablet Take 1 tablet (2.5 mg total) by mouth 2 (two) times daily. Resume Sunday with the EVENING dose. 11/11/19   Shirley Friar, PA-C  aspirin EC 81 MG tablet Take 81 mg by mouth daily.    [provider]  BREO ELLIPTA 100-25 MCG/INH AEPB Inhale 1 puff into the lungs daily.  07/07/18   [provider]  dorzolamide-timolol (COSOPT) 22.3-6.8 MG/ML ophthalmic solution Place 1 drop into both eyes daily.  02/16/18   [provider]  FLUAD 0.5 ML SUSY ADMINISTER 0.5ML IN THE MUSCLE AS DIRECTED 08/17/18   [provider]  FLUAD QUADRIVALENT 0.5 ML injection  09/11/19   [provider]  furosemide (LASIX) 20 MG tablet Take 1 tablet (20 mg total) by mouth daily. 12/05/19   Troy Sine, MD  HYDROcodone-acetaminophen (NORCO/VICODIN) 5-325 MG tablet Take 1 tablet by mouth every 4 (four) hours as needed. 12/19/19   Rancour, Annie Main, MD  Iron-Vitamins (GERITOL) LIQD Take 10 mLs by mouth daily.    [provider]  isosorbide mononitrate (IMDUR) 30 MG 24 hr tablet TAKE 1 TABLET(30 MG) BY MOUTH DAILY 06/21/19   Troy Sine, MD  lidocaine (LIDODERM) 5 % Place 1 patch onto the skin daily. Remove & Discard patch within 12 hours or as directed by MD 12/19/19   Maudie Flakes, MD  LUMIGAN 0.01 % SOLN Place 1 drop into both eyes at bedtime.  12/06/14   [provider]  metoprolol succinate (TOPROL-XL) 100 MG 24 hr tablet Take 1 tablet (100 mg total) by mouth daily. Take with or immediately  following a meal. 02/08/20   Troy Sine, MD  nitroGLYCERIN (NITROSTAT) 0.4 MG SL tablet Place 1 tablet (0.4 mg total) under the tongue every 5 (five) minutes as needed for chest pain. 01/16/18   Troy Sine, MD  pravastatin (PRAVACHOL) 40 MG tablet Take 40 mg by mouth daily.  10/02/19   [provider]  RESTASIS 0.05 % ophthalmic emulsion Place 1 drop into both eyes daily. 12/30/14   [provider]  spironolactone (ALDACTONE) 25 MG tablet Take 0.5 tablets (12.5 mg total) by mouth daily. 11/09/19   Georgette Shell, MD    Allergies    Patient has no known allergies.  Review of Systems   Review of Systems  Constitutional: Negative for fever.  HENT: Positive for nosebleeds. Negative for congestion.   Eyes: Negative for visual disturbance.  Respiratory: Negative for apnea.   Cardiovascular: Negative for chest pain.  Gastrointestinal: Negative for abdominal pain.  Genitourinary: Negative for difficulty urinating.  Musculoskeletal: Negative for arthralgias.  Skin: Negative for color change.  Neurological: Negative for dizziness.  Psychiatric/Behavioral: Negative for agitation.  All other systems reviewed and are negative.   Physical Exam Updated Vital Signs BP (!) 168/100 (BP Location: Right Arm)   Pulse 72   Temp 97.7 F (36.5 C) (Oral)   Resp 14   Ht 5\' 8"  (1.727 m)   Wt 75.3 kg   SpO2 97%   BMI 25.24 kg/m   Physical Exam Vitals and nursing note reviewed.  Constitutional:      General: He is not in acute distress.    Appearance: Normal appearance.  HENT:     Head: Normocephalic and atraumatic.     Nose: Nose normal. No congestion or rhinorrhea.  Eyes:     Conjunctiva/sclera: Conjunctivae normal.     Pupils: Pupils are equal, round, and reactive to light.  Cardiovascular:     Rate and Rhythm: Normal rate and regular rhythm.     Pulses: Normal pulses.     Heart sounds: Normal heart sounds.  Pulmonary:     Effort: Pulmonary effort is normal.      Breath sounds: Normal breath sounds.  Abdominal:     General: Abdomen is flat. Bowel sounds are normal.     Tenderness: There is no abdominal tenderness. There is no guarding.  Musculoskeletal:        General: Normal range of motion.  Cervical back: Normal range of motion and neck supple.  Skin:    General: Skin is warm and dry.     Capillary Refill: Capillary refill takes less than 2 seconds.  Neurological:     General: No focal deficit present.     Mental Status: He is alert and oriented to person, place, and time.     Deep Tendon Reflexes: Reflexes normal.  Psychiatric:        Mood and Affect: Mood normal.        Behavior: Behavior normal.     ED Results / Procedures / Treatments   Labs (all labs ordered are listed, but only abnormal results are displayed) Labs Reviewed - No data to display  EKG None  Radiology No results found.  Procedures Procedures (including critical care time)  Medications Ordered in ED Medications - No data to display  ED Course  I have reviewed the triage vital signs and the nursing notes.  Pertinent labs & imaging results that were available during my care of the patient were reviewed by me and considered in my medical decision making (see chart for details).   No bleeding on exam.  Well appearing, given afrin in the ED.  Labeled for dispense 2 sprays twice daily for 2 days.    [name] was evaluated in Emergency Department on 04/18/2020 for the symptoms described in the history of present illness. He was evaluated in the context of the global COVID-19 pandemic, which necessitated consideration that the patient might be at risk for infection with the SARS-CoV-2 virus that causes COVID-19. Institutional protocols and algorithms that pertain to the evaluation of patients at risk for COVID-19 are in a state of rapid change based on information released by regulatory bodies including the CDC and federal and state organizations. These  policies and algorithms were followed during the patient's care in the ED.   Final Clinical Impression(s) / ED Diagnoses Return for intractable cough, coughing up blood,fevers >100.4 unrelieved by medication, shortness of breath, intractable vomiting, chest pain, shortness of breath, weakness,numbness, changes in speech, facial asymmetry,abdominal pain, passing out,Inability to tolerate liquids or food, cough, altered mental status or any concerns. No signs of systemic illness or infection. The patient is nontoxic-appearing on exam and vital signs are within normal limits.   I have reviewed the triage vital signs and the nursing notes. Pertinent labs &imaging results that were available during my care of the patient were reviewed by me and considered in my medical decision making (see chart for details).After history, exam, and medical workup I feel the patient has beenappropriately medically screened and is safe for discharge home. Pertinent diagnoses were discussed with the patient. Patient was given return precautions.   Sanae Willetts, MD 04/18/20 [phone]

## 2020-04-19 ENCOUNTER — Emergency Department (HOSPITAL_COMMUNITY)
Admission: EM | Admit: 2020-04-19 | Discharge: 2020-04-19 | Disposition: A | Discharge status: Home / Self Care | Payer: Medicare Other | Attending: Emergency Medicine | Admitting: Emergency Medicine

## 2020-04-19 ENCOUNTER — Other Ambulatory Visit: Payer: Self-pay

## 2020-04-19 ENCOUNTER — Emergency Department (HOSPITAL_COMMUNITY): Payer: Medicare Other

## 2020-04-19 ENCOUNTER — Encounter (HOSPITAL_COMMUNITY): Payer: Self-pay

## 2020-04-19 DIAGNOSIS — Z7982 Long term (current) use of aspirin: Secondary | ICD-10-CM | POA: Insufficient documentation

## 2020-04-19 DIAGNOSIS — I130 Hypertensive heart and chronic kidney disease with heart failure and stage 1 through stage 4 chronic kidney disease, or unspecified chronic kidney disease: Secondary | ICD-10-CM | POA: Insufficient documentation

## 2020-04-19 DIAGNOSIS — I5032 Chronic diastolic (congestive) heart failure: Secondary | ICD-10-CM | POA: Diagnosis not present

## 2020-04-19 DIAGNOSIS — N200 Calculus of kidney: Secondary | ICD-10-CM | POA: Diagnosis not present

## 2020-04-19 DIAGNOSIS — Z950 Presence of cardiac pacemaker: Secondary | ICD-10-CM | POA: Insufficient documentation

## 2020-04-19 DIAGNOSIS — I2510 Atherosclerotic heart disease of native coronary artery without angina pectoris: Secondary | ICD-10-CM | POA: Diagnosis not present

## 2020-04-19 DIAGNOSIS — N3001 Acute cystitis with hematuria: Secondary | ICD-10-CM | POA: Diagnosis not present

## 2020-04-19 DIAGNOSIS — R319 Hematuria, unspecified: Secondary | ICD-10-CM | POA: Diagnosis not present

## 2020-04-19 DIAGNOSIS — Z7901 Long term (current) use of anticoagulants: Secondary | ICD-10-CM | POA: Insufficient documentation

## 2020-04-19 DIAGNOSIS — N183 Chronic kidney disease, stage 3 unspecified: Secondary | ICD-10-CM | POA: Diagnosis not present

## 2020-04-19 DIAGNOSIS — Z79899 Other long term (current) drug therapy: Secondary | ICD-10-CM | POA: Diagnosis not present

## 2020-04-19 DIAGNOSIS — Z87891 Personal history of nicotine dependence: Secondary | ICD-10-CM | POA: Insufficient documentation

## 2020-04-19 DIAGNOSIS — I252 Old myocardial infarction: Secondary | ICD-10-CM | POA: Diagnosis not present

## 2020-04-19 LAB — URINALYSIS, ROUTINE W REFLEX MICROSCOPIC
Bilirubin Urine: NEGATIVE
Glucose, UA: NEGATIVE mg/dL
Ketones, ur: NEGATIVE mg/dL
Nitrite: POSITIVE — AB
Protein, ur: 100 mg/dL — AB
RBC / HPF: >50 RBC/hpf — ABNORMAL HIGH (ref 0–5)
Specific Gravity, Urine: 1.016 (ref 1.005–1.030)
WBC, UA: >50 WBC/hpf — ABNORMAL HIGH (ref 0–5)
pH: 6 (ref 5.0–8.0)

## 2020-04-19 LAB — CBC WITH DIFFERENTIAL/PLATELET
Abs Immature Granulocytes: 0.02 10*3/uL (ref 0.00–0.07)
Basophils Absolute: 0 10*3/uL (ref 0.0–0.1)
Basophils Relative: 1 %
Eosinophils Absolute: 0.3 10*3/uL (ref 0.0–0.5)
Eosinophils Relative: 5 %
HCT: 44.7 % (ref 39.0–52.0)
Hemoglobin: 14.5 g/dL (ref 13.0–17.0)
Immature Granulocytes: 0 %
Lymphocytes Relative: 43 %
Lymphs Abs: 2.3 10*3/uL (ref 0.7–4.0)
MCH: 29.8 pg (ref 26.0–34.0)
MCHC: 32.4 g/dL (ref 30.0–36.0)
MCV: 92 fL (ref 80.0–100.0)
Monocytes Absolute: 0.6 10*3/uL (ref 0.1–1.0)
Monocytes Relative: 11 %
Neutro Abs: 2.1 10*3/uL (ref 1.7–7.7)
Neutrophils Relative %: 40 %
Platelets: 153 10*3/uL (ref 150–400)
RBC: 4.86 MIL/uL (ref 4.22–5.81)
RDW: 12.7 % (ref 11.5–15.5)
WBC: 5.3 10*3/uL (ref 4.0–10.5)
nRBC: 0 % (ref 0.0–0.2)

## 2020-04-19 LAB — BASIC METABOLIC PANEL
Anion gap: 7 (ref 5–15)
BUN: 20 mg/dL (ref 8–23)
CO2: 26 mmol/L (ref 22–32)
Calcium: 9.5 mg/dL (ref 8.9–10.3)
Chloride: 108 mmol/L (ref 98–111)
Creatinine, Ser: 1.51 mg/dL — ABNORMAL HIGH (ref 0.61–1.24)
GFR calc Af Amer: 49 mL/min — ABNORMAL LOW (ref >60)
GFR calc non Af Amer: 43 mL/min — ABNORMAL LOW (ref >60)
Glucose, Bld: 113 mg/dL — ABNORMAL HIGH (ref 70–99)
Potassium: 3.9 mmol/L (ref 3.5–5.1)
Sodium: 141 mmol/L (ref 135–145)

## 2020-04-19 MED ORDER — CEPHALEXIN 500 MG PO CAPS: 500.0000 mg | ORAL_CAPSULE | Freq: Four times a day (QID) | ORAL | 0 refills | Status: AC

## 2020-04-19 NOTE — ED Provider Notes (Signed)
[facility] EMERGENCY DEPARTMENT Provider Note   CSN: [id number] Arrival date & time: 04/19/20  1248     History No chief complaint on file.   [name] is a 79 y.o. male with a past medical history significant for CAD, chronic systolic congestive heart failure, CKD stage III, hypertension, nonischemic cardiomyopathy, PAD, and paroxysmal V. Tach who presents to the ED due to hematuria x2 days.  Patient states sometimes his urine is red in color and other times he passes "clots". Denies penile discharge, testicular pain, and penile pain. Denies abdominal pain. He used to be on Eliquis, but is no longer on any anticoagulants. Denies dysuria. He is currently sexually active with 1 partner with no concerns for STDs. Admits to chronic back pain, but no change from his baseline over the past 2 days. Denies fever and chills. Last BM today which was normal. Denies melena, hematochezia, and hematemesis. Denies trauma to pelvic region and penis.   History obtained from patient and past medical records. No interpreter used during encounter.      Past Medical History:  Diagnosis Date  . CAD (coronary artery disease) 80% stenosis diag of the LAD, 30% in OM2 branch of LCX in 2009    a. Nonobstructive CAD by cath 11/2011 with the exception of the pre-existing diagonal branch #2 lesion.  . Chronic systolic CHF (congestive heart failure) ([place])   . CKD (chronic kidney disease) stage 3, GFR 30-59 ml/min   . Colon polyp, hyperplastic   . History of stress test 06/01/2012   Normal myocardial perfusion study. compared to the previous study there is no significant change. this is a low risk scan  . Hypertension   . Legally blind    "both eyes"  . Myocardial infarction ([place]) 11/22/11  . NICM (nonischemic cardiomyopathy) ([place])    a. Remote hx of dilated NICM with EF ranging 20-45%, including normal EF by echo (55-60%) in 2014.  Marland Kitchen Peripheral arterial disease ([place])    a. 06/2014: ABI  right 0.99, left 1.2, LE dopplers revealing an occluded right posterior tibial. As symptoms were not felt r/t claudication, no further w/u at the time.  Marland Kitchen PVC's (premature ventricular contractions)   . 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 Requiring discontinuation of BB dose.  Marland Kitchen Spondylolisthesis   . Ventricular bigeminy   . Ventricular tachycardia (paroxysmal) ([place]) 04/11/2015    Patient Active Problem List   Diagnosis Date Noted  . ICD (implantable cardioverter-defibrillator) in place   . Cardiac syncope 11/07/2019  . Closed fracture of distal fibula 11/07/2019  . Pain due to onychomycosis of toenails of both feet 05/16/2019  . Coagulation disorder ([place]) 05/16/2019  . Paroxysmal atrial fibrillation ([place]) 08/02/2018  . NSVT (nonsustained ventricular tachycardia) ([place]) 08/02/2018  . Symptomatic bradycardia 07/12/2018  . Pacemaker 07/12/2018  . Tachycardia-bradycardia syndrome ([place])   . Unsteady gait 03/12/2016  . Nausea vomiting and diarrhea 03/11/2016  . Acute lower UTI 03/11/2016  . Gastroenteritis 03/11/2016  . Obstipation 03/11/2016  . Abdominal pain   . Constipation   . Lactic acidosis   . Leg pain   . Essential hypertension 03/04/2016  . Ventricular tachycardia (paroxysmal) ([place]) 04/11/2015  . Chest pain with moderate risk for cardiac etiology 04/09/2015  . CAP (community acquired pneumonia) 02/18/2015  . Chest pain at rest 02/18/2015  . Peripheral arterial disease ([place]) 08/15/2014  . Hyperlipidemia with target LDL less than 70 01/01/2014  . GERD (gastroesophageal reflux disease) 01/01/2014  .  First degree AV block 07/31/2012  . Unstable angina, negative MI 06/05/2012  . Second degree AV block, Mobitz type I 05/27/2012  . CKD (chronic kidney disease) stage 3, GFR 30-59 ml/min 05/27/2012  . 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 Chronic diastolic CHF (congestive heart failure) ([place])  11/23/2011    Class: Diagnosis of  . Coronary artery disease involving native coronary artery of native heart without angina pectoris 11/22/2011  . Premature ventricular contractions 11/22/2011  . Esophageal reflux 07/28/2011    Past Surgical History:  Procedure Laterality Date  . BACK SURGERY    . BIV UPGRADE N/A 11/08/2019   Procedure: BIV UPGRADE;  Surgeon: Evans Lance, MD;  Location: [facility];  Service: Cardiovascular;  Laterality: N/A;  . CARDIAC CATHETERIZATION  11/2011  . CARDIAC CATHETERIZATION  11/2011   didn't demonstrate high grade obstructive disease to account for his LV dysfunction.  Marland Kitchen CATARACT EXTRACTION, BILATERAL  1990's  . CYSTOSCOPY    . CYSTOSCOPY WITH URETHRAL DILATATION N/A 05/04/2013   Procedure: CYSTOSCOPY WITH URETHRAL DILATATION;  Surgeon: Eustace Moore, MD;  Location: [facility];  Service: Neurosurgery;  Laterality: N/A;  with insertion of foley catheter  . EP study and ablation of VT  7/13   PVC focus mapped to the right coronary cusp of the aorta, limited ablation performed due to proximity of the focus to the right coronary artery  . EYE SURGERY    . LEFT HEART CATH AND CORONARY ANGIOGRAPHY N/A 11/07/2019   Procedure: LEFT HEART CATH AND CORONARY ANGIOGRAPHY;  Surgeon: Wellington Hampshire, MD;  Location: [facility];  Service: Cardiovascular;  Laterality: N/A;  . LEFT HEART CATHETERIZATION WITH CORONARY ANGIOGRAM N/A 11/25/2011   Procedure: LEFT HEART CATHETERIZATION WITH CORONARY ANGIOGRAM;  Surgeon: Leonie Man, MD;  Location: [facility] CATH LAB;  Service: Cardiovascular;  Laterality: N/A;  . PACEMAKER IMPLANT N/A 07/12/2018   Procedure: PACEMAKER IMPLANT;  Surgeon: Sanda Klein, MD;  Location: [facility];  Service: Cardiovascular;  Laterality: N/A;  . POSTERIOR FUSION [place]  . ROTATOR CUFF REPAIR  2000's   left  . V-TACH ABLATION N/A 06/06/2012   Procedure: V-TACH ABLATION;  Surgeon: Thompson Grayer, MD;  Location: [facility]  CATH LAB;  Service: Cardiovascular;  Laterality: N/A;       Family History  Problem Relation Age of Onset  . Asthma Mother   . Other Other        Unsure if any heart disease in his family.    Social History   Tobacco Use  . Smoking status: Former Smoker    Years: 50.00    Types: Cigarettes    Quit date: 11/23/1999    Years since quitting: 20.4  . Smokeless tobacco: Never Used  Substance Use Topics  . 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 Drug use: No    Home Medications Prior to Admission medications   Medication Sig Start Date End Date Taking? Authorizing Provider  albuterol (PROVENTIL HFA;VENTOLIN HFA) 108 (90 Base) MCG/ACT inhaler Inhale 1 puff into the lungs every 6 (six) hours as needed for wheezing or shortness of breath.    [provider]  apixaban (ELIQUIS) 2.5 MG TABS tablet Take 1 tablet (2.5 mg total) by mouth 2 (two) times daily. Resume Sunday with the EVENING dose. 11/11/19  Shirley Friar, PA-C  aspirin EC 81 MG tablet Take 81 mg by mouth daily.    [provider]  BREO ELLIPTA 100-25 MCG/INH AEPB Inhale 1 puff into the lungs daily.  07/07/18   [provider]  cephALEXin (KEFLEX) 500 MG capsule Take 1 capsule (500 mg total) by mouth 4 (four) times daily for 5 days. 04/19/20 04/24/20  Suzy Bouchard, PA-C  dorzolamide-timolol (COSOPT) 22.3-6.8 MG/ML ophthalmic solution Place 1 drop into both eyes daily.  02/16/18   [provider]  FLUAD 0.5 ML SUSY ADMINISTER 0.5ML IN THE MUSCLE AS DIRECTED 08/17/18   [provider]  FLUAD QUADRIVALENT 0.5 ML injection  09/11/19   [provider]  furosemide (LASIX) 20 MG tablet Take 1 tablet (20 mg total) by mouth daily. 12/05/19   Troy Sine, MD  HYDROcodone-acetaminophen (NORCO/VICODIN) 5-325 MG tablet Take 1 tablet by mouth every 4 (four) hours as needed. 12/19/19   Rancour, Annie Main, MD  Iron-Vitamins  (GERITOL) LIQD Take 10 mLs by mouth daily.    [provider]  isosorbide mononitrate (IMDUR) 30 MG 24 hr tablet TAKE 1 TABLET(30 MG) BY MOUTH DAILY 06/21/19   Troy Sine, MD  lidocaine (LIDODERM) 5 % Place 1 patch onto the skin daily. Remove & Discard patch within 12 hours or as directed by MD 12/19/19   Maudie Flakes, MD  LUMIGAN 0.01 % SOLN Place 1 drop into both eyes at bedtime.  12/06/14   [provider]  metoprolol succinate (TOPROL-XL) 100 MG 24 hr tablet Take 1 tablet (100 mg total) by mouth daily. Take with or immediately following a meal. 02/08/20   Troy Sine, MD  nitroGLYCERIN (NITROSTAT) 0.4 MG SL tablet Place 1 tablet (0.4 mg total) under the tongue every 5 (five) minutes as needed for chest pain. 01/16/18   Troy Sine, MD  pravastatin (PRAVACHOL) 40 MG tablet Take 40 mg by mouth daily.  10/02/19   [provider]  RESTASIS 0.05 % ophthalmic emulsion Place 1 drop into both eyes daily. 12/30/14   [provider]  spironolactone (ALDACTONE) 25 MG tablet Take 0.5 tablets (12.5 mg total) by mouth daily. 11/09/19   Georgette Shell, MD    Allergies    Patient has no known allergies.  Review of Systems   Review of Systems  Constitutional: Negative for chills and fever.  Gastrointestinal: Negative for abdominal pain, diarrhea, nausea and vomiting.  Genitourinary: Positive for hematuria. Negative for difficulty urinating, discharge, dysuria, flank pain, penile pain, penile swelling, scrotal swelling and testicular pain.  Musculoskeletal: Positive for back pain (chronic back pain).  All other systems reviewed and are negative.   Physical Exam Updated Vital Signs BP (!) 163/104   Pulse 71   Temp 98.2 F (36.8 C) (Oral)   Resp 18   Ht 5\' 8"  (1.727 m)   Wt 78.5 kg   SpO2 99%   BMI 26.30 kg/m   Physical Exam Vitals and nursing note reviewed. Exam conducted with a chaperone present.  Constitutional:      General: He is not in  acute distress.    Appearance: He is not ill-appearing.  HENT:     Head: Normocephalic.  Eyes:     Pupils: Pupils are equal, round, and reactive to light.  Cardiovascular:     Rate and Rhythm: Normal rate and regular rhythm.     Pulses: Normal pulses.     Heart sounds: Normal heart sounds. No murmur. No  friction rub. No gallop.   Pulmonary:     Effort: Pulmonary effort is normal.     Breath sounds: Normal breath sounds.  Abdominal:     General: Abdomen is flat. Bowel sounds are normal. There is no distension.     Palpations: Abdomen is soft.     Tenderness: There is no abdominal tenderness. There is no right CVA tenderness, left CVA tenderness, guarding or rebound.     Comments: Abdomen soft, nondistended, nontender to palpation in all quadrants without guarding or peritoneal signs. No rebound. Negative CVA tenderness bilaterally.   Genitourinary:    Comments: Normal uncircumcised penis with no testicular tenderness.  Musculoskeletal:     Cervical back: Neck supple.     Comments: Able to move all 4 extremities without difficulty.   Skin:    General: Skin is warm and dry.  Neurological:     General: No focal deficit present.     Mental Status: He is alert.  Psychiatric:        Mood and Affect: Mood normal.        Behavior: Behavior normal.     ED Results / Procedures / Treatments   Labs (all labs ordered are listed, but only abnormal results are displayed) Labs Reviewed  URINALYSIS, ROUTINE W REFLEX MICROSCOPIC - Abnormal; Notable for the following components:      Result Value   Color, Urine AMBER (*)    APPearance CLOUDY (*)    Hgb urine dipstick LARGE (*)    Protein, ur 100 (*)    Nitrite POSITIVE (*)    Leukocytes,Ua MODERATE (*)    RBC / HPF >50 (*)    WBC, UA >50 (*)    Bacteria, UA RARE (*)    All other components within normal limits  BASIC METABOLIC PANEL - Abnormal; Notable for the following components:   Glucose, Bld 113 (*)    Creatinine, Ser 1.51 (*)      GFR calc non Af Amer 43 (*)    GFR calc Af Amer 49 (*)    All other components within normal limits  URINE CULTURE  CBC WITH DIFFERENTIAL/PLATELET    EKG None  Radiology CT Renal Stone Study  Result Date: 04/19/2020 CLINICAL DATA:  79 year old male with hematuria. EXAM: CT ABDOMEN AND PELVIS WITHOUT CONTRAST TECHNIQUE: Multidetector CT imaging of the abdomen and pelvis was performed following the standard protocol without IV contrast. COMPARISON:  CT abdomen pelvis dated 03/10/2016. FINDINGS: Evaluation of this exam is limited in the absence of intravenous contrast. Evaluation is also limited due to streak artifact caused by spinal hardware. Lower chest: Focal area of hazy density in the lingula, likely atelectasis or scarring. Developing infiltrate is less likely but not excluded. Clinical correlation is recommended. Cardiac wires noted. No intra-abdominal free air or free fluid. Hepatobiliary: Mild irregularity of the liver contour may represent early changes of cirrhosis. Clinical correlation is recommended. No intrahepatic biliary ductal dilatation. Cholecystectomy. Pancreas: Unremarkable. No pancreatic ductal dilatation or surrounding inflammatory changes. Spleen: Normal in size without focal abnormality. Adrenals/Urinary Tract: The adrenal glands are unremarkable. Multiple nonobstructing bilateral renal calculi measure up to 7 mm in the upper pole of the right kidney. There is no hydronephrosis on either side. There is a 5 cm left renal inferior pole cystic lesion as well as a smaller cystic lesion in the interpolar aspect of the right kidney. These are suboptimally characterized on this noncontrast CT. The visualized ureters and urinary bladder appear unremarkable. Stomach/Bowel: There is  moderate stool throughout the colon. Several scattered colonic diverticula without active inflammatory changes. There is no bowel obstruction or active inflammation. The appendix is normal.  Vascular/Lymphatic: Advanced aortoiliac atherosclerotic disease. The IVC is grossly unremarkable. No portal venous gas. There is no adenopathy. Reproductive: The prostate and seminal vesicles are grossly unremarkable. Dystrophic calcification of the prostate gland noted. Other: Midline vertical anterior pelvic wall incisional scar. Musculoskeletal: Degenerative changes of the spine and osteopenia. Postsurgical changes of lower lumbar laminectomy. Lumbar fusion hardware as seen previously. No acute osseous pathology. IMPRESSION: 1. Nonobstructing bilateral renal calculi. No hydronephrosis. 2. Moderate colonic stool burden. No bowel obstruction. Normal appendix. 3. Aortic Atherosclerosis (ICD10-I70.0). Electronically Signed   By: Anner Crete M.D.   On: 04/19/2020 17:29    Procedures Procedures (including critical care time)  Medications Ordered in ED Medications - No data to display  ED Course  I have reviewed the triage vital signs and the nursing notes.  Pertinent labs & imaging results that were available during my care of the patient were reviewed by me and considered in my medical decision making (see chart for details).  Clinical Course as of Apr 19 1736  Sat Apr 19, 2020  1501 Nitrite(!): POSITIVE [CA]  1501 Chalmers Guest): MODERATE [CA]    Clinical Course User Index [CA] Suzy Bouchard, PA-C   MDM Rules/Calculators/A&P                     79 year old male presents to the ED due to hematuria x2 days.  Denies dysuria, abdominal pain, fever, chills, and injury. History of chronic back pain that has not changed in character. Vitals all within normal limits. Patient in no acute distress and non-ill appearing. Physical exam reassuring. Abdomen soft, non-distended, and non-tender. Negative CVA tenderness bilaterally. Normal uncircumcised penis with no testicular tenderness. UA ordered at triage. Will add CBC, BMP, and renal study to rule out kidney stone.  UA significant for  large hematuria, positive nitrites, and moderate leukocytes. Suspect hematuria related to UTI; however, will obtain renal study given back pain to rule out infected kidney stone.  CBC unremarkable with no leukocytosis.  BMP significant for creatinine at 1.51 which appears better than baseline. Patient has known CKD. CT renal study personally reviewed which demonstrates: IMPRESSION:  1. Nonobstructing bilateral renal calculi. No hydronephrosis.  2. Moderate colonic stool burden. No bowel obstruction. Normal  appendix.  3. Aortic Atherosclerosis (ICD10-I70.0).     No concern for infected kidney stones. Suspect hematuria related to acute cystitis. Will discharge patient with Keflex for UTI with urology follow-up. Advised patient to follow-up with urology for further evaluation of hematuria. Strict ED precautions discussed with patient. Patient states understanding and agrees to plan. Patient discharged home in no acute distress and stable vitals  Discussed case with Dr. Tyrone Nine who evaluated patient at bedside and agrees with assessment and plan.  Final Clinical Impression(s) / ED Diagnoses Final diagnoses:  Hematuria, unspecified type  Acute cystitis with hematuria    Rx / DC Orders ED Discharge Orders         Ordered    cephALEXin (KEFLEX) 500 MG capsule  4 times daily     04/19/20 1736           Suzy Bouchard, PA-C 04/19/20 [place], [place], DO 04/19/20 1749

## 2020-04-19 NOTE — ED Triage Notes (Signed)
Patient complains of hematuria x 2 days. No hx of same, denies pain, denies trauma

## 2020-04-19 NOTE — Discharge Instructions (Addendum)
As discussed, your urine showed signs of infection.  I am sending you home with an antibiotic.  I suspect your blood in your urine is because of your urinary tract infection however I have included the number for the urologist.  Please call Monday to schedule an appointment for further evaluation.  Take the antibiotics as prescribed and finish all antibiotics.  Return to the ER for new or worsening symptoms.

## 2020-04-21 LAB — URINE CULTURE: Culture: >=100000 — AB

## 2020-04-22 ENCOUNTER — Telehealth: Payer: Self-pay | Admitting: *Deleted

## 2020-04-22 ENCOUNTER — Other Ambulatory Visit: Payer: Self-pay | Admitting: Cardiovascular Disease

## 2020-04-22 MED ORDER — NITROGLYCERIN 0.4 MG SL SUBL: 0.4000 mg | SUBLINGUAL_TABLET | SUBLINGUAL | 5 refills | Status: DC | PRN

## 2020-04-22 NOTE — Telephone Encounter (Signed)
New message     *STAT* If patient is at the pharmacy, call can be transferred to refill team.   1. Which medications need to be refilled? (please list name of each medication and dose if known) nitroGLYCERIN (NITROSTAT) 0.4 MG SL tablet  2. Which pharmacy/location (including street and city if local pharmacy) is medication to be sent to? [place], [place] - [address] AT [address]  3. Do they need a 30 day or 90 day supply? [place]

## 2020-04-22 NOTE — Telephone Encounter (Signed)
Contacted patient in follow up to ED visit on 04/19/2020.  States he is feeling better and is asymptomatic. No further follow up needed.

## 2020-04-24 ENCOUNTER — Emergency Department (HOSPITAL_COMMUNITY)
Admission: EM | Admit: 2020-04-24 | Discharge: 2020-04-24 | Disposition: A | Discharge status: Home / Self Care | Payer: Medicare Other | Attending: Emergency Medicine | Admitting: Emergency Medicine

## 2020-04-24 ENCOUNTER — Other Ambulatory Visit: Payer: Self-pay

## 2020-04-24 ENCOUNTER — Telehealth: Payer: Self-pay | Admitting: Internal Medicine

## 2020-04-24 DIAGNOSIS — I5032 Chronic diastolic (congestive) heart failure: Secondary | ICD-10-CM | POA: Insufficient documentation

## 2020-04-24 DIAGNOSIS — R58 Hemorrhage, not elsewhere classified: Secondary | ICD-10-CM | POA: Diagnosis not present

## 2020-04-24 DIAGNOSIS — Z95810 Presence of automatic (implantable) cardiac defibrillator: Secondary | ICD-10-CM | POA: Diagnosis not present

## 2020-04-24 DIAGNOSIS — I2510 Atherosclerotic heart disease of native coronary artery without angina pectoris: Secondary | ICD-10-CM | POA: Diagnosis not present

## 2020-04-24 DIAGNOSIS — N183 Chronic kidney disease, stage 3 unspecified: Secondary | ICD-10-CM | POA: Insufficient documentation

## 2020-04-24 DIAGNOSIS — I10 Essential (primary) hypertension: Secondary | ICD-10-CM | POA: Diagnosis not present

## 2020-04-24 DIAGNOSIS — I130 Hypertensive heart and chronic kidney disease with heart failure and stage 1 through stage 4 chronic kidney disease, or unspecified chronic kidney disease: Secondary | ICD-10-CM | POA: Insufficient documentation

## 2020-04-24 DIAGNOSIS — R040 Epistaxis: Secondary | ICD-10-CM | POA: Diagnosis not present

## 2020-04-24 DIAGNOSIS — Z7982 Long term (current) use of aspirin: Secondary | ICD-10-CM | POA: Insufficient documentation

## 2020-04-24 DIAGNOSIS — G4489 Other headache syndrome: Secondary | ICD-10-CM | POA: Diagnosis not present

## 2020-04-24 DIAGNOSIS — Z79899 Other long term (current) drug therapy: Secondary | ICD-10-CM | POA: Diagnosis not present

## 2020-04-24 DIAGNOSIS — Z87891 Personal history of nicotine dependence: Secondary | ICD-10-CM | POA: Insufficient documentation

## 2020-04-24 DIAGNOSIS — Z743 Need for continuous supervision: Secondary | ICD-10-CM | POA: Diagnosis not present

## 2020-04-24 MED ORDER — OXYMETAZOLINE HCL 0.05 % NA SOLN
1.0000 | Freq: Once | NASAL | Status: AC
  Administered 2020-04-24: 1 via NASAL
  Filled 2020-04-24: qty 30

## 2020-04-24 NOTE — ED Notes (Signed)
Patient given discharge instructions. Questions were answered. Patient verbalized understanding of discharge instructions and care at home.  

## 2020-04-24 NOTE — ED Provider Notes (Signed)
[facility] Provider Note   CSN: [id number] Arrival date & time: 04/24/20  [id number]     History Chief Complaint  Patient presents with  . Epistaxis    [name] is a 79 y.o. male.  Patient is an 79 year old male with past medical history of coronary artery disease, chronic renal insufficiency, hypertension, cardiomyopathy. He presents today for evaluation of bleeding from his nose. Patient was seen 5 days ago with similar complaints. He was given Afrin nasal spray with good hemostasis. He has been using this at home, but his nose began bleeding again this morning. He had trouble controlling it with direct pressure by himself. He was given Neo-Synephrine by EMS and a 4 x 4 gauze was placed within the nares and the bleeding has since stopped. He arrives here with no further bleeding. He denies any injury or trauma. He denies any fevers or chills.  The history is provided by the patient.  Epistaxis Location:  R nare Duration:  1 hour Timing:  Constant Progression:  Resolved Chronicity:  Recurrent Context: aspirin use   Context: not anticoagulants and not trauma   Relieved by:  Applying pressure and vasoconstrictors Worsened by:  Nothing Ineffective treatments:  None tried Associated symptoms: no fever        Past Medical History:  Diagnosis Date  . CAD (coronary artery disease) 80% stenosis diag of the LAD, 30% in OM2 branch of LCX in 2009    a. Nonobstructive CAD by cath 11/2011 with the exception of the pre-existing diagonal branch #2 lesion.  . Chronic systolic CHF (congestive heart failure) ([place])   . CKD (chronic kidney disease) stage 3, GFR 30-59 ml/min   . Colon polyp, hyperplastic   . History of stress test 06/01/2012   Normal myocardial perfusion study. compared to the previous study there is no significant change. this is a low risk scan  . Hypertension   . Legally blind    "both eyes"  . Myocardial infarction ([place]) 11/22/11   . NICM (nonischemic cardiomyopathy) ([place])    a. Remote hx of dilated NICM with EF ranging 20-45%, including normal EF by echo (55-60%) in 2014.  Marland Kitchen Peripheral arterial disease ([place])    a. 06/2014: ABI right 0.99, left 1.2, LE dopplers revealing an occluded right posterior tibial. As symptoms were not felt r/t claudication, no further w/u at the time.  Marland Kitchen PVC's (premature ventricular contractions)   . 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 Requiring discontinuation of BB dose.  Marland Kitchen Spondylolisthesis   . Ventricular bigeminy   . Ventricular tachycardia (paroxysmal) ([place]) 04/11/2015    Patient Active Problem List   Diagnosis Date Noted  . ICD (implantable cardioverter-defibrillator) in place   . Cardiac syncope 11/07/2019  . Closed fracture of distal fibula 11/07/2019  . Pain due to onychomycosis of toenails of both feet 05/16/2019  . Coagulation disorder ([place]) 05/16/2019  . Paroxysmal atrial fibrillation ([place]) 08/02/2018  . NSVT (nonsustained ventricular tachycardia) ([place]) 08/02/2018  . Symptomatic bradycardia 07/12/2018  . Pacemaker 07/12/2018  . Tachycardia-bradycardia syndrome ([place])   . Unsteady gait 03/12/2016  . Nausea vomiting and diarrhea 03/11/2016  . Acute lower UTI 03/11/2016  . Gastroenteritis 03/11/2016  . Obstipation 03/11/2016  . Abdominal pain   . Constipation   . Lactic acidosis   . Leg pain   . Essential hypertension 03/04/2016  . Ventricular tachycardia (paroxysmal) ([place]) 04/11/2015  . Chest pain with moderate risk for cardiac etiology 04/09/2015  .  CAP (community acquired pneumonia) 02/18/2015  . Chest pain at rest 02/18/2015  . Peripheral arterial disease ([place]) 08/15/2014  . Hyperlipidemia with target LDL less than 70 01/01/2014  . GERD (gastroesophageal reflux disease) 01/01/2014  . First degree AV block 07/31/2012  . Unstable angina, negative MI 06/05/2012  . Second degree AV block, Mobitz type I 05/27/2012  . CKD (chronic kidney disease) stage 3, GFR  30-59 ml/min 05/27/2012  . 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 Chronic diastolic CHF (congestive heart failure) ([place]) 11/23/2011    Class: Diagnosis of  . Coronary artery disease involving native coronary artery of native heart without angina pectoris 11/22/2011  . Premature ventricular contractions 11/22/2011  . Esophageal reflux 07/28/2011    Past Surgical History:  Procedure Laterality Date  . BACK SURGERY    . BIV UPGRADE N/A 11/08/2019   Procedure: BIV UPGRADE;  Surgeon: Evans Lance, MD;  Location: [facility];  Service: Cardiovascular;  Laterality: N/A;  . CARDIAC CATHETERIZATION  11/2011  . CARDIAC CATHETERIZATION  11/2011   didn't demonstrate high grade obstructive disease to account for his LV dysfunction.  Marland Kitchen CATARACT EXTRACTION, BILATERAL  1990's  . CYSTOSCOPY    . CYSTOSCOPY WITH URETHRAL DILATATION N/A 05/04/2013   Procedure: CYSTOSCOPY WITH URETHRAL DILATATION;  Surgeon: Eustace Moore, MD;  Location: [facility];  Service: Neurosurgery;  Laterality: N/A;  with insertion of foley catheter  . EP study and ablation of VT  7/13   PVC focus mapped to the right coronary cusp of the aorta, limited ablation performed due to proximity of the focus to the right coronary artery  . EYE SURGERY    . LEFT HEART CATH AND CORONARY ANGIOGRAPHY N/A 11/07/2019   Procedure: LEFT HEART CATH AND CORONARY ANGIOGRAPHY;  Surgeon: Wellington Hampshire, MD;  Location: [facility];  Service: Cardiovascular;  Laterality: N/A;  . LEFT HEART CATHETERIZATION WITH CORONARY ANGIOGRAM N/A 11/25/2011   Procedure: LEFT HEART CATHETERIZATION WITH CORONARY ANGIOGRAM;  Surgeon: Leonie Man, MD;  Location: [facility] C[facility] LAB;  Service: Cardiovascular;  Laterality: N/A;  . PACEMAKER IMPLANT N/A 07/12/2018   Procedure: PACEMAKER IMPLANT;  Surgeon: Sanda Klein, MD;  Location: [facility];  Service: Cardiovascular;   Laterality: N/A;  . POSTERIOR FUSION [place]  . ROTATOR CUFF REPAIR  2000's   left  . V-TACH ABLATION N/A 06/06/2012   Procedure: V-TACH ABLATION;  Surgeon: Thompson Grayer, MD;  Location: [facility];  Service: Cardiovascular;  Laterality: N/A;       Family History  Problem Relation Age of Onset  . Asthma Mother   . Other Other        Unsure if any heart disease in his family.    Social History   Tobacco Use  . Smoking status: Former Smoker    Years: 50.00    Types: Cigarettes    Quit date: 11/23/1999    Years since quitting: 20.4  . Smokeless tobacco: Never Used  Substance Use Topics  . 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 Drug use: No    Home Medications Prior to Admission medications   Medication Sig Start Date End Date Taking? Authorizing Provider  albuterol (PROVENTIL HFA;VENTOLIN HFA) 108 (90 Base) MCG/ACT inhaler Inhale 1 puff into the lungs every 6 (six) hours as needed for  wheezing or shortness of breath.    [provider]  apixaban (ELIQUIS) 2.5 MG TABS tablet Take 1 tablet (2.5 mg total) by mouth 2 (two) times daily. Resume Sunday with the EVENING dose. 11/11/19   Shirley Friar, PA-C  aspirin EC 81 MG tablet Take 81 mg by mouth daily.    [provider]  BREO ELLIPTA 100-25 MCG/INH AEPB Inhale 1 puff into the lungs daily.  07/07/18   [provider]  cephALEXin (KEFLEX) 500 MG capsule Take 1 capsule (500 mg total) by mouth 4 (four) times daily for 5 days. 04/19/20 04/24/20  Suzy Bouchard, PA-C  dorzolamide-timolol (COSOPT) 22.3-6.8 MG/ML ophthalmic solution Place 1 drop into both eyes daily.  02/16/18   [provider]  FLUAD 0.5 ML SUSY ADMINISTER 0.5ML IN THE MUSCLE AS DIRECTED 08/17/18   [provider]  FLUAD QUADRIVALENT 0.5 ML injection  09/11/19   [provider]  furosemide (LASIX) 20 MG tablet Take 1 tablet (20 mg total) by  mouth daily. 12/05/19   Troy Sine, MD  HYDROcodone-acetaminophen (NORCO/VICODIN) 5-325 MG tablet Take 1 tablet by mouth every 4 (four) hours as needed. 12/19/19   Rancour, Annie Main, MD  Iron-Vitamins (GERITOL) LIQD Take 10 mLs by mouth daily.    [provider]  isosorbide mononitrate (IMDUR) 30 MG 24 hr tablet TAKE 1 TABLET(30 MG) BY MOUTH DAILY 06/21/19   Troy Sine, MD  lidocaine (LIDODERM) 5 % Place 1 patch onto the skin daily. Remove & Discard patch within 12 hours or as directed by MD 12/19/19   Maudie Flakes, MD  LUMIGAN 0.01 % SOLN Place 1 drop into both eyes at bedtime.  12/06/14   [provider]  metoprolol succinate (TOPROL-XL) 100 MG 24 hr tablet Take 1 tablet (100 mg total) by mouth daily. Take with or immediately following a meal. 02/08/20   Troy Sine, MD  nitroGLYCERIN (NITROSTAT) 0.4 MG SL tablet Place 1 tablet (0.4 mg total) under the tongue every 5 (five) minutes as needed for chest pain. 04/22/20   Troy Sine, MD  pravastatin (PRAVACHOL) 40 MG tablet Take 40 mg by mouth daily.  10/02/19   [provider]  RESTASIS 0.05 % ophthalmic emulsion Place 1 drop into both eyes daily. 12/30/14   [provider]  spironolactone (ALDACTONE) 25 MG tablet Take 0.5 tablets (12.5 mg total) by mouth daily. 11/09/19   Georgette Shell, MD    Allergies    Patient has no known allergies.  Review of Systems   Review of Systems  Constitutional: Negative for fever.  HENT: Positive for nosebleeds.     Physical Exam Updated Vital Signs BP (!) 164/111 (BP Location: Right Arm)   Pulse 82   Temp 97.6 F (36.4 C) (Oral)   Resp 17   SpO2 99%   Physical Exam Vitals and nursing note reviewed.  Constitutional:      General: He is not in acute distress.    Appearance: He is well-developed. He is not diaphoretic.  HENT:     Head: Normocephalic and atraumatic.     Nose:     Comments: There is old blood in the right nares, however no active  bleeding. The left is clear. Posterior oropharynx is clear. Cardiovascular:     Rate and Rhythm: Normal rate and regular rhythm.     Heart sounds: No murmur. No friction rub.  Pulmonary:     Effort: Pulmonary effort is normal. No respiratory  distress.     Breath sounds: Normal breath sounds. No wheezing or rales.  Musculoskeletal:        General: Normal range of motion.     Cervical back: Normal range of motion and neck supple.  Skin:    General: Skin is warm and dry.  Neurological:     Mental Status: He is alert and oriented to person, place, and time.     Coordination: Coordination normal.     ED Results / Procedures / Treatments   Labs (all labs ordered are listed, but only abnormal results are displayed) Labs Reviewed - No data to display  EKG None  Radiology No results found.  Procedures Procedures (including critical care time)  Medications Ordered in ED Medications - No data to display  ED Course  I have reviewed the triage vital signs and the nursing notes.  Pertinent labs & imaging results that were available during my care of the patient were reviewed by me and considered in my medical decision making (see chart for details).    MDM Rules/Calculators/A&P  Patient presenting for the second time in 1 week with complaints of nosebleed.  He was initially seen and given Afrin nasal spray which he has been using.  This morning his nose began bleeding again and presents as he is having difficulty getting it stopped.  Patient arrived here without active bleeding and no definitive site of the bleeding was identified.  He was given Afrin nasal spray and watched.  His nose began bleeding a second time, and this time the nose was packed with a Rhino Rocket.  This seems to have controlled the bleeding.  Patient will be discharged with follow-up with ear nose and throat.  To return as needed for any problems.  Final Clinical Impression(s) / ED Diagnoses Final diagnoses:    None    Rx / DC Orders ED Discharge Orders    None       Veryl Speak, MD 04/24/20 1100

## 2020-04-24 NOTE — ED Notes (Signed)
Nasal packing placed in Right Nare by Dr. Stark Jock

## 2020-04-24 NOTE — ED Triage Notes (Addendum)
Patient arrives via ems due to having repeated nosebleeds at home. Per ems, the patient had 2 episodes of nosebleeds yesterday that resolved somewhat after a putting a cold compress on his nose. This morning, the patient was washing his face when his nose started bleeding again with no relief from Afrin nose spray, so he called ems. The patient was seen 2x last week for the same. Patient's blood thinners were discontinued around December due to repeated nose bleeds.   Patient received 4 sprays of afrin from ems en route, as well as an ice pack with pinching his nose. Nose bleed resolved prior to arrival. Patient arrives with gauze in the Right nostril. No pain

## 2020-04-24 NOTE — Telephone Encounter (Signed)
Patient's spouse is calling to make Dr. Lovena Le aware that the patient is currently hospitalized and he has a heavy nose bleed. Please call to discuss.

## 2020-04-24 NOTE — Discharge Instructions (Addendum)
Leave packing in place until followed up by ear, nose, and throat.  The contact information for [facility] ENT has been provided in this discharge summary for you to call and make these arrangements.  This appointment should occur within the next 3 days.  Return to the emergency department if you experience any new and/or concerning symptoms.

## 2020-04-25 NOTE — Telephone Encounter (Signed)
Returned call to [facility].  Confirmed Pt at home and doing well.  Confirmed f/u appt made with ENT-on April 29, 2020  Per discharge instructions-confirmed with EC Pt should leave packing in his nose until his f/u with ENT.  EC thanked nurse for call back.

## 2020-04-29 DIAGNOSIS — J342 Deviated nasal septum: Secondary | ICD-10-CM | POA: Diagnosis not present

## 2020-04-29 DIAGNOSIS — R040 Epistaxis: Secondary | ICD-10-CM | POA: Diagnosis not present

## 2020-05-05 ENCOUNTER — Other Ambulatory Visit: Payer: Self-pay | Admitting: Cardiovascular Disease

## 2020-05-05 DIAGNOSIS — I10 Essential (primary) hypertension: Secondary | ICD-10-CM | POA: Diagnosis not present

## 2020-05-05 DIAGNOSIS — N189 Chronic kidney disease, unspecified: Secondary | ICD-10-CM | POA: Diagnosis not present

## 2020-05-05 DIAGNOSIS — R040 Epistaxis: Secondary | ICD-10-CM | POA: Diagnosis not present

## 2020-05-05 DIAGNOSIS — H42 Glaucoma in diseases classified elsewhere: Secondary | ICD-10-CM | POA: Diagnosis not present

## 2020-05-05 DIAGNOSIS — E785 Hyperlipidemia, unspecified: Secondary | ICD-10-CM | POA: Diagnosis not present

## 2020-05-06 ENCOUNTER — Other Ambulatory Visit: Payer: Self-pay

## 2020-05-06 ENCOUNTER — Emergency Department (HOSPITAL_COMMUNITY)
Admission: EM | Admit: 2020-05-06 | Discharge: 2020-05-06 | Disposition: A | Discharge status: Home / Self Care | Payer: Medicare Other | Attending: Emergency Medicine | Admitting: Emergency Medicine

## 2020-05-06 ENCOUNTER — Emergency Department (HOSPITAL_COMMUNITY): Payer: Medicare Other

## 2020-05-06 ENCOUNTER — Encounter (HOSPITAL_COMMUNITY): Payer: Self-pay | Admitting: Emergency Medicine

## 2020-05-06 ENCOUNTER — Other Ambulatory Visit: Payer: Self-pay | Admitting: Cardiovascular Disease

## 2020-05-06 DIAGNOSIS — N183 Chronic kidney disease, stage 3 unspecified: Secondary | ICD-10-CM | POA: Insufficient documentation

## 2020-05-06 DIAGNOSIS — W19XXXA Unspecified fall, initial encounter: Secondary | ICD-10-CM

## 2020-05-06 DIAGNOSIS — S8002XA Contusion of left knee, initial encounter: Secondary | ICD-10-CM | POA: Insufficient documentation

## 2020-05-06 DIAGNOSIS — I130 Hypertensive heart and chronic kidney disease with heart failure and stage 1 through stage 4 chronic kidney disease, or unspecified chronic kidney disease: Secondary | ICD-10-CM | POA: Insufficient documentation

## 2020-05-06 DIAGNOSIS — Y929 Unspecified place or not applicable: Secondary | ICD-10-CM | POA: Insufficient documentation

## 2020-05-06 DIAGNOSIS — Z87891 Personal history of nicotine dependence: Secondary | ICD-10-CM | POA: Diagnosis not present

## 2020-05-06 DIAGNOSIS — M25562 Pain in left knee: Secondary | ICD-10-CM | POA: Diagnosis not present

## 2020-05-06 DIAGNOSIS — S80212A Abrasion, left knee, initial encounter: Secondary | ICD-10-CM | POA: Diagnosis not present

## 2020-05-06 DIAGNOSIS — Z79899 Other long term (current) drug therapy: Secondary | ICD-10-CM | POA: Insufficient documentation

## 2020-05-06 DIAGNOSIS — Y999 Unspecified external cause status: Secondary | ICD-10-CM | POA: Insufficient documentation

## 2020-05-06 DIAGNOSIS — Z7982 Long term (current) use of aspirin: Secondary | ICD-10-CM | POA: Insufficient documentation

## 2020-05-06 DIAGNOSIS — W1830XA Fall on same level, unspecified, initial encounter: Secondary | ICD-10-CM | POA: Insufficient documentation

## 2020-05-06 DIAGNOSIS — Z95811 Presence of heart assist device: Secondary | ICD-10-CM | POA: Insufficient documentation

## 2020-05-06 DIAGNOSIS — I252 Old myocardial infarction: Secondary | ICD-10-CM | POA: Diagnosis not present

## 2020-05-06 DIAGNOSIS — Z23 Encounter for immunization: Secondary | ICD-10-CM | POA: Diagnosis not present

## 2020-05-06 DIAGNOSIS — I5022 Chronic systolic (congestive) heart failure: Secondary | ICD-10-CM | POA: Diagnosis not present

## 2020-05-06 DIAGNOSIS — Y9301 Activity, walking, marching and hiking: Secondary | ICD-10-CM | POA: Insufficient documentation

## 2020-05-06 DIAGNOSIS — I2510 Atherosclerotic heart disease of native coronary artery without angina pectoris: Secondary | ICD-10-CM | POA: Diagnosis not present

## 2020-05-06 DIAGNOSIS — S8992XA Unspecified injury of left lower leg, initial encounter: Secondary | ICD-10-CM | POA: Diagnosis not present

## 2020-05-06 DIAGNOSIS — M25462 Effusion, left knee: Secondary | ICD-10-CM | POA: Diagnosis not present

## 2020-05-06 MED ORDER — TETANUS-DIPHTH-ACELL PERTUSSIS 5-2.5-18.5 LF-MCG/0.5 IM SUSP
0.5000 mL | Freq: Once | INTRAMUSCULAR | Status: AC
  Administered 2020-05-06: 0.5 mL via INTRAMUSCULAR
  Filled 2020-05-06: qty 0.5

## 2020-05-06 MED ORDER — OXYCODONE-ACETAMINOPHEN 5-325 MG PO TABS
1.0000 | ORAL_TABLET | Freq: Once | ORAL | Status: AC
  Administered 2020-05-06: 1 via ORAL
  Filled 2020-05-06: qty 1

## 2020-05-06 NOTE — Discharge Instructions (Signed)
Wear the knee immobilizer while you are up and walking. You may take Tylenol or ibuprofen, available over-the-counter according to label instructions as needed for pain. Please call to follow-up with orthopedics in the next few days.

## 2020-05-06 NOTE — Telephone Encounter (Signed)
*  STAT* If patient is at the pharmacy, call can be transferred to refill team.   1. Which medications need to be refilled? (please list name of each medication and dose if known) metoprolol succinate (TOPROL-XL) 100 MG 24 hr tablet  2. Which pharmacy/location (including street and city if local pharmacy) is medication to be sent to? [place], [place] - [address] AT [address]  3. Do they need a 30 day or 90 day supply? [place]

## 2020-05-06 NOTE — ED Triage Notes (Signed)
Pt c/o left knee pain after a mechanical fall today. Denies hitting his head, no LOC.

## 2020-05-06 NOTE — ED Provider Notes (Signed)
[facility] Provider Note   CSN: [id number] Arrival date & time: 05/06/20  1611     History Chief Complaint  Patient presents with  . Fall    [name] is a 79 y.o. male presents to the ER for evaluation of left knee pain that began suddenly today after a fall.  Patient states he was walking outside to the park when he tripped over some cement on the ground and he fell forwards landing first on his left knee and bracing his fall with his left palm.  States he was able to stand up and walk to his house.  He sat down and states after a few minutes the pain only intensified and he could not tolerate the pain so he called a friend who drove him to the ER.  The pain has since gotten worse and he has not been able to extend his knee or put weight on it.  He denies any other injuries.  States his left palm is slightly sore but is able to fully move his fingers and wrist without significant pain.  Denies any head injury, loss of consciousness.  Has associated left knee swelling and an abrasion.  Patient states he used to be on Eliquis but he had some nosebleeds from it and his cardiologist told him to stop it.  He thinks he last took it 6 months ago.  Patient lives with his girlfriend.  He usually ambulates without any assistance or assistive devices.  No distal tingling, loss of sensation.  Pain is worse with flexing and weightbearing.  HPI     Past Medical History:  Diagnosis Date  . CAD (coronary artery disease) 80% stenosis diag of the LAD, 30% in OM2 branch of LCX in 2009    a. Nonobstructive CAD by cath 11/2011 with the exception of the pre-existing diagonal branch #2 lesion.  . Chronic systolic CHF (congestive heart failure) ([place])   . CKD (chronic kidney disease) stage 3, GFR 30-59 ml/min   . Colon polyp, hyperplastic   . History of stress test 06/01/2012   Normal myocardial perfusion study. compared to the previous study there is no significant  change. this is a low risk scan  . Hypertension   . Legally blind    "both eyes"  . Myocardial infarction ([place]) 11/22/11  . NICM (nonischemic cardiomyopathy) ([place])    a. Remote hx of dilated NICM with EF ranging 20-45%, including normal EF by echo (55-60%) in 2014.  Marland Kitchen Peripheral arterial disease ([place])    a. 06/2014: ABI right 0.99, left 1.2, LE dopplers revealing an occluded right posterior tibial. As symptoms were not felt r/t claudication, no further w/u at the time.  Marland Kitchen PVC's (premature ventricular contractions)   . 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 Requiring discontinuation of BB dose.  Marland Kitchen Spondylolisthesis   . Ventricular bigeminy   . Ventricular tachycardia (paroxysmal) ([place]) 04/11/2015    Patient Active Problem List   Diagnosis Date Noted  . ICD (implantable cardioverter-defibrillator) in place   . Cardiac syncope 11/07/2019  . Closed fracture of distal fibula 11/07/2019  . Pain due to onychomycosis of toenails of both feet 05/16/2019  . Coagulation disorder ([place]) 05/16/2019  . Paroxysmal atrial fibrillation ([place]) 08/02/2018  . NSVT (nonsustained ventricular tachycardia) ([place]) 08/02/2018  . Symptomatic bradycardia 07/12/2018  . Pacemaker 07/12/2018  . Tachycardia-bradycardia syndrome ([place])   . Unsteady gait 03/12/2016  . Nausea vomiting and diarrhea 03/11/2016  .  Acute lower UTI 03/11/2016  . Gastroenteritis 03/11/2016  . Obstipation 03/11/2016  . Abdominal pain   . Constipation   . Lactic acidosis   . Leg pain   . Essential hypertension 03/04/2016  . Ventricular tachycardia (paroxysmal) ([place]) 04/11/2015  . Chest pain with moderate risk for cardiac etiology 04/09/2015  . CAP (community acquired pneumonia) 02/18/2015  . Chest pain at rest 02/18/2015  . Peripheral arterial disease ([place]) 08/15/2014  . Hyperlipidemia with target LDL less than 70 01/01/2014  . GERD (gastroesophageal reflux disease) 01/01/2014  . First degree AV block 07/31/2012  . Unstable  angina, negative MI 06/05/2012  . Second degree AV block, Mobitz type I 05/27/2012  . CKD (chronic kidney disease) stage 3, GFR 30-59 ml/min 05/27/2012  . 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 Chronic diastolic CHF (congestive heart failure) ([place]) 11/23/2011    Class: Diagnosis of  . Coronary artery disease involving native coronary artery of native heart without angina pectoris 11/22/2011  . Premature ventricular contractions 11/22/2011  . Esophageal reflux 07/28/2011    Past Surgical History:  Procedure Laterality Date  . BACK SURGERY    . BIV UPGRADE N/A 11/08/2019   Procedure: BIV UPGRADE;  Surgeon: Evans Lance, MD;  Location: [facility];  Service: Cardiovascular;  Laterality: N/A;  . CARDIAC CATHETERIZATION  11/2011  . CARDIAC CATHETERIZATION  11/2011   didn't demonstrate high grade obstructive disease to account for his LV dysfunction.  Marland Kitchen CATARACT EXTRACTION, BILATERAL  1990's  . CYSTOSCOPY    . CYSTOSCOPY WITH URETHRAL DILATATION N/A 05/04/2013   Procedure: CYSTOSCOPY WITH URETHRAL DILATATION;  Surgeon: Eustace Moore, MD;  Location: [facility];  Service: Neurosurgery;  Laterality: N/A;  with insertion of foley catheter  . EP study and ablation of VT  7/13   PVC focus mapped to the right coronary cusp of the aorta, limited ablation performed due to proximity of the focus to the right coronary artery  . EYE SURGERY    . LEFT HEART CATH AND CORONARY ANGIOGRAPHY N/A 11/07/2019   Procedure: LEFT HEART CATH AND CORONARY ANGIOGRAPHY;  Surgeon: Wellington Hampshire, MD;  Location: [facility];  Service: Cardiovascular;  Laterality: N/A;  . LEFT HEART CATHETERIZATION WITH CORONARY ANGIOGRAM N/A 11/25/2011   Procedure: LEFT HEART CATHETERIZATION WITH CORONARY ANGIOGRAM;  Surgeon: Leonie Man, MD;  Location[facility] C[facility];  Service: Cardiovascular;  Laterality: N/A;  . PACEMAKER IMPLANT N/A  07/12/2018   Procedure: PACEMAKER IMPLANT;  Surgeon: Sanda Klein, MD;  Location: [facility];  Service: Cardiovascular;  Laterality: N/A;  . POSTERIOR FUSION [place]  . ROTATOR CUFF REPAIR  2000's   left  . V-TACH ABLATION N/A 06/06/2012   Procedure: V-TACH ABLATION;  Surgeon: Thompson Grayer, MD;  Location: [facility] CATH LAB;  Service: Cardiovascular;  Laterality: N/A;       Family History  Problem Relation Age of Onset  . Asthma Mother   . Other Other        Unsure if any heart disease in his family.    Social History   Tobacco Use  . Smoking status: Former Smoker    Years: 50.00    Types: Cigarettes    Quit date: 11/23/1999    Years since quitting: 20.4  . Smokeless tobacco: Never Used  Vaping Use  . Vaping Use: Never used  Substance Use Topics  . Alcohol use:  No    Comment: 05/26/12 "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; stopped drinking in the early 1980's"  . Drug use: No    Home Medications Prior to Admission medications   Medication Sig Start Date End Date Taking? Authorizing Provider  albuterol (PROVENTIL HFA;VENTOLIN HFA) 108 (90 Base) MCG/ACT inhaler Inhale 1 puff into the lungs every 6 (six) hours as needed for wheezing or shortness of breath.    [provider]  apixaban (ELIQUIS) 2.5 MG TABS tablet Take 1 tablet (2.5 mg total) by mouth 2 (two) times daily. Resume Sunday with the EVENING dose. Patient not taking: Reported on 04/24/2020 11/11/19   Shirley Friar, PA-C  aspirin 325 MG tablet Take 325 mg by mouth daily.    [provider]  dorzolamide-timolol (COSOPT) 22.3-6.8 MG/ML ophthalmic solution Place 1 drop into both eyes daily.  02/16/18   [provider]  furosemide (LASIX) 20 MG tablet Take 1 tablet (20 mg total) by mouth daily. 12/05/19   Troy Sine, MD  HYDROcodone-acetaminophen (NORCO/VICODIN) 5-325 MG tablet Take 1 tablet by mouth every 4 (four) hours as needed. Patient not taking: Reported on 04/24/2020 12/19/19   Ezequiel Essex, MD  isosorbide mononitrate (IMDUR) 30 MG 24 hr tablet TAKE 1 TABLET(30 MG) BY MOUTH DAILY Patient taking differently: Take 30 mg by mouth daily.  06/21/19   Troy Sine, MD  lidocaine (LIDODERM) 5 % Place 1 patch onto the skin daily. Remove & Discard patch within 12 hours or as directed by MD Patient not taking: Reported on 04/24/2020 12/19/19   Maudie Flakes, MD  LUMIGAN 0.01 % SOLN Place 1 drop into both eyes at bedtime.  12/06/14   [provider]  metoprolol succinate (TOPROL-XL) 100 MG 24 hr tablet Take 1 tablet (100 mg total) by mouth daily. Take with or immediately following a meal. 02/08/20   Troy Sine, MD  nitroGLYCERIN (NITROSTAT) 0.4 MG SL tablet Place 1 tablet (0.4 mg total) under the tongue every 5 (five) minutes as needed for chest pain. 04/22/20   Troy Sine, MD  RESTASIS 0.05 % ophthalmic emulsion Place 1 drop into both eyes daily. 12/30/14   [provider]  spironolactone (ALDACTONE) 25 MG tablet Take 0.5 tablets (12.5 mg total) by mouth daily. 11/09/19   Georgette Shell, MD    Allergies    Patient has no known allergies.  Review of Systems   Review of Systems  Musculoskeletal: Positive for arthralgias and joint swelling.  All other systems reviewed and are negative.   Physical Exam Updated Vital Signs BP (!) 148/85 (BP Location: Left Arm)   Pulse 67   Temp 98.9 F (37.2 C) (Oral)   Resp 18   SpO2 100%   Physical Exam Vitals and nursing note reviewed.  Constitutional:      General: He is not in acute distress.    Appearance: He is well-developed.     Comments: NAD.  HENT:     Head: Normocephalic and atraumatic.     Right Ear: External ear normal.     Left Ear: External ear normal.     Nose: Nose normal.  Eyes:     General: No scleral icterus.    Conjunctiva/sclera: Conjunctivae normal.  Cardiovascular:     Rate and Rhythm: Normal rate and regular rhythm.     Heart sounds: Normal heart sounds.     Comments: 1+ DP  and PT pulses in the left extremity.  No calf tenderness.  Upper leg and lower leg compartments soft and nontender. Pulmonary:     Effort: Pulmonary effort is normal.     Breath sounds: Normal breath sounds.  Musculoskeletal:        General: Tenderness and signs of injury present. Normal range of motion.     Cervical back: Normal range of motion and neck supple.     Comments: Left knee: Abrasion over the patella noted.  Patient is very nervous, very jumpy during evaluation.  As I attempt to begin the exam he grabs my hands and pushes them away.  He has diffuse tenderness over the patella and infrapatellar area.  Milder discomfort over the quad tendon.  Patient has decreased flexion and extension due to pain.  He can move his leg to the side of the bed and allow it to flex against gravity.  He can extend the knee against gravity but not fully due to pain.  No obvious edema of the joint.  No popliteal space fullness or tenderness.  No calf tenderness or asymmetry.  No left hip tenderness or groin tenderness.  Negative leg roll.  No leg shortening or rotation.  No left ankle tenderness.  Full range of motion of the ankle without pain.  Skin:    General: Skin is warm and dry.     Capillary Refill: Capillary refill takes less than 2 seconds.     Findings: Abrasion present.  Neurological:     Mental Status: He is alert and oriented to person, place, and time.     Comments: Sensation and strength intact in the left ankle.  Unable to assess left knee flexion/extension strength due to pain/patient guarding.   Psychiatric:        Behavior: Behavior normal.        Thought Content: Thought content normal.        Judgment: Judgment normal.     ED Results / Procedures / Treatments   Labs (all labs ordered are listed, but only abnormal results are displayed) Labs Reviewed - No data to display  EKG None  Radiology CT Knee Left Wo Contrast  Result Date: 05/06/2020 CLINICAL DATA:  Direct fall to  patella, negative radiographs, rule out occult fracture EXAM: CT OF THE LEFT KNEE WITHOUT CONTRAST TECHNIQUE: Multidetector CT imaging of the LEFT knee was performed according to the standard protocol. Multiplanar CT image reconstructions were also generated. COMPARISON:  Radiograph 05/07/2019 FINDINGS: Bones/Joint/Cartilage Possible avulsion type fracture involving the medial tibial spine near the footprint of the ACL with cortical step-off in transcortical lucency near the tip of the spine. No other acute or suspicious fracture is seen; specifically, no evidence of patellar fracture. Findings on a background of mild tricompartmental degenerative changes with periarticular spurring lateral greater than medial joint space narrowing is noted with chondrocalcinosis of the lateral meniscus which appears slightly displaced laterally. Suspect possible degenerative tearing of the posteromedial meniscus as well some lucency and thickening. Edema and stranding is noted subjacent to the lateral supporting structures of the knee and subjacent to the lateral retinaculum. Milder stranding along the medial joint line. There is a moderate joint effusion. Ligaments Thickening and stranding is seen centered upon the anterior cruciate ligament. The posterior cruciate ligament is grossly intact. Ligamentous injuries are suboptimally assessed on CT imaging however. Minimal stranding subjacent to the medial supporting structures. Muscles and Tendons Extensor mechanism is intact. No evidence of musculotendinous injuries. No intramuscular hemorrhage or collection. No retracted torn tendons. Soft tissues Mild anterior soft tissue thickening  compatible with a small amount of prepatellar bursal fluid and contusive change. No soft tissue gas or foreign body. Atherosclerotic calcifications noted in the distal superficial femoral and popliteal arteries as well as in the tibioperoneal trunk. IMPRESSION: 1. Mild anterior soft tissue thickening  compatible with a small amount of prepatellar bursal fluid and contusive change. No visible patellar fracture. 2. Possible avulsion type fracture involving the medial tibial spine near the footprint of the ACL with cortical step-off and transcortical lucency near the tip of the spine. Additional thickening and stranding of the anterior cruciate ligament itself is suggestive of sprain or partial injury. Would be better assessed with MR imaging, ideally on an outpatient basis with orthopedic follow-up. 3. Chondrocalcinosis of lateral meniscus can reflect some underlying calcium pyrophosphate deposition. A lateralized appearance of the meniscus could poor tendon underlying meniscal injury versus degenerative tearing as well particularly given fluid and stranding subjacent to the lateral supporting structures. Suspect some degenerative tearing of the medial meniscus as well. Findings would be better characterized with MRI as well. 4. Moderate joint effusion. 5. Mild tricompartmental degenerative changes. 6. Atherosclerosis. Electronically Signed   By: Lovena Le M.D.   On: 05/06/2020 22:45   DG Knee Complete 4 Views Left  Result Date: 05/06/2020 CLINICAL DATA:  Left knee pain after fall today EXAM: LEFT KNEE - COMPLETE 4+ VIEW COMPARISON:  None. FINDINGS: Frontal, bilateral oblique, lateral views of the left knee are obtained. There is mild medial and lateral compartmental osteoarthritis with minimal joint space narrowing. Chondrocalcinosis seen in the lateral compartment. No fracture, subluxation, or dislocation. No joint effusion. IMPRESSION: 1. Mild osteoarthritis.  No acute fracture. Electronically Signed   By: Randa Ngo M.D.   On: 05/06/2020 17:41    Procedures Procedures (including critical care time)  Medications Ordered in ED Medications  oxyCODONE-acetaminophen (PERCOCET/ROXICET) 5-325 MG per tablet 1 tablet (1 tablet Oral Given 05/06/20 2227)  Tdap (BOOSTRIX) injection 0.5 mL (0.5 mLs  Intramuscular Given 05/06/20 2228)    ED Course  I have reviewed the triage vital signs and the nursing notes.  Pertinent labs & imaging results that were available during my care of the patient were reviewed by me and considered in my medical decision making (see chart for details).    MDM Rules/Calculators/A&P                          79 y.o. yo male here after mechanical fall.  Reports pain to left knee.    HD stable on arrival.  Alert. No obvious signs of significant head, pelvis injury.   On exam patient is very jumpy.  No obvious significant joint effusion, warmth or instability.  He is able to move from bed to standing position without assistance.  Difficult exam due to pain however patient is able to flex and extend the joint not fully due to pain.  Can hold leg straight against gravity off the side of the bed.  No neuro pulse deficits.  No other signs of injury to proximal/distal joints.  Left knee x-ray is negative.  Given level of pain, age and mechanism of injury CT of the knee was obtained.  Percocet and tetanus ordered.  Patient will be handed off to EDP who will follow up on CT scan and determine disposition.  Final Clinical Impression(s) / ED Diagnoses Final diagnoses:  Fall, initial encounter  Injury of left knee, initial encounter    Rx / DC Orders ED Discharge  Orders    None       Arlean Hopping 05/06/20 2258    Quintella Reichert, MD 05/07/20 [phone]

## 2020-05-06 NOTE — ED Notes (Signed)
Pt verbalized understanding of d/c instructions, follow up and pain management. Pt transported to [facility] via [facility]

## 2020-05-06 NOTE — ED Notes (Signed)
Pt to CT

## 2020-05-07 MED ORDER — METOPROLOL SUCCINATE ER 100 MG PO TB24: 100.0000 mg | ORAL_TABLET | Freq: Every day | ORAL | 2 refills | Status: DC

## 2020-05-07 NOTE — Progress Notes (Signed)
Orthopedic Tech Progress Note Patient Details:  [name] 11/07/1941 [id number]  Ortho Devices Type of Ortho Device: Knee Immobilizer Ortho Device/Splint Location: lle Ortho Device/Splint Interventions: Ordered, Application, Adjustment   Post Interventions Patient Tolerated: Well Instructions Provided: Care of device, Adjustment of device   Karolee Stamps 05/07/2020, 12:18 AM

## 2020-05-12 DIAGNOSIS — W19XXXA Unspecified fall, initial encounter: Secondary | ICD-10-CM | POA: Diagnosis not present

## 2020-05-12 DIAGNOSIS — M25562 Pain in left knee: Secondary | ICD-10-CM | POA: Diagnosis not present

## 2020-05-19 ENCOUNTER — Telehealth: Payer: Self-pay

## 2020-05-19 ENCOUNTER — Ambulatory Visit (INDEPENDENT_AMBULATORY_CARE_PROVIDER_SITE_OTHER): Payer: Medicare Other | Admitting: *Deleted

## 2020-05-19 DIAGNOSIS — I440 Atrioventricular block, first degree: Secondary | ICD-10-CM

## 2020-05-19 NOTE — Telephone Encounter (Signed)
Patient called in today he has received a letter that he missed a transmission. His monitor had been unplugged so he will let it charge a bit and do the transmission

## 2020-05-19 NOTE — Telephone Encounter (Signed)
Spoke with patient again the monitor is not working properly so they will call tech support and give [place] a call back

## 2020-05-19 NOTE — Telephone Encounter (Signed)
Patient transmission came through successful and I changed his appointments

## 2020-05-21 ENCOUNTER — Ambulatory Visit (INDEPENDENT_AMBULATORY_CARE_PROVIDER_SITE_OTHER): Payer: Medicare Other | Admitting: Podiatry

## 2020-05-21 ENCOUNTER — Encounter: Payer: Self-pay | Admitting: Podiatry

## 2020-05-21 ENCOUNTER — Other Ambulatory Visit: Payer: Self-pay

## 2020-05-21 DIAGNOSIS — M79674 Pain in right toe(s): Secondary | ICD-10-CM

## 2020-05-21 DIAGNOSIS — M79675 Pain in left toe(s): Secondary | ICD-10-CM | POA: Diagnosis not present

## 2020-05-21 DIAGNOSIS — B351 Tinea unguium: Secondary | ICD-10-CM

## 2020-05-21 DIAGNOSIS — I739 Peripheral vascular disease, unspecified: Secondary | ICD-10-CM

## 2020-05-21 DIAGNOSIS — N1831 Chronic kidney disease, stage 3a: Secondary | ICD-10-CM

## 2020-05-21 LAB — CUP PACEART REMOTE DEVICE CHECK
Battery Remaining Longevity: 71 mo
Battery Remaining Percentage: 89 %
Battery Voltage: 2.96 V
Brady Statistic AP VP Percent: 91 %
Brady Statistic AP VS Percent: 4.2 %
Brady Statistic AS VP Percent: 1.5 %
Brady Statistic AS VS Percent: 1 %
Brady Statistic RA Percent Paced: 91 %
Date Time Interrogation Session: 20210628154846
HighPow Impedance: 71 Ohm
Implantable Lead Implant Date: 20190821
Implantable Lead Implant Date: 20201217
Implantable Lead Implant Date: 20201217
Implantable Lead Location: 753858
Implantable Lead Location: 753859
Implantable Lead Location: 753860
Implantable Pulse Generator Implant Date: 20201217
Lead Channel Impedance Value: 380 Ohm
Lead Channel Impedance Value: 480 Ohm
Lead Channel Impedance Value: 560 Ohm
Lead Channel Pacing Threshold Amplitude: 0.5 V
Lead Channel Pacing Threshold Amplitude: 0.5 V
Lead Channel Pacing Threshold Amplitude: 1.5 V
Lead Channel Pacing Threshold Pulse Width: 0.4 ms
Lead Channel Pacing Threshold Pulse Width: 0.5 ms
Lead Channel Pacing Threshold Pulse Width: 0.5 ms
Lead Channel Sensing Intrinsic Amplitude: 12 mV
Lead Channel Sensing Intrinsic Amplitude: 2.3 mV
Lead Channel Setting Pacing Amplitude: 2 V
Lead Channel Setting Pacing Amplitude: 2 V
Lead Channel Setting Pacing Amplitude: 2.5 V
Lead Channel Setting Pacing Pulse Width: 0.5 ms
Lead Channel Setting Pacing Pulse Width: 0.5 ms
Lead Channel Setting Sensing Sensitivity: 0.5 mV
Pulse Gen Serial Number: 111014567

## 2020-05-21 NOTE — Progress Notes (Signed)
This patient returns to my office for at risk foot care.  This patient requires this care by a professional since this patient will be at risk due to having , PAD and  Chronic kidney disease.  .  This patient is unable to cut nails himself since the patient cannot reach his nails.These nails are painful walking and wearing shoes.  This patient presents for at risk foot care today.  General Appearance  Alert, conversant and in no acute stress.  Vascular  Dorsalis pedis and posterior tibial  pulses are palpable  bilaterally.  Capillary return is within normal limits  bilaterally. Temperature is within normal limits  bilaterally.  Neurologic  Senn-Weinstein monofilament wire test within normal limits  bilaterally. Muscle power within normal limits bilaterally.  Nails Thick disfigured discolored nails with subungual debris  from hallux to fifth toes bilaterally. No evidence of bacterial infection or drainage bilaterally.  Orthopedic  No limitations of motion  feet .  No crepitus or effusions noted.  No bony pathology or digital deformities noted.  Skin  normotropic skin with no porokeratosis noted bilaterally.  No signs of infections or ulcers noted.     Onychomycosis  Pain in right toes  Pain in left toes  Consent was obtained for treatment procedures.   Mechanical debridement of nails 1-5  bilaterally performed with a nail nipper.  Filed with dremel without incident.    Return office visit   3 months                   Told patient to return for periodic foot care and evaluation due to potential at risk complications.   Gardiner Barefoot DPM

## 2020-05-23 DIAGNOSIS — R310 Gross hematuria: Secondary | ICD-10-CM | POA: Diagnosis not present

## 2020-05-23 NOTE — Progress Notes (Signed)
Remote ICD transmission.   

## 2020-05-28 DIAGNOSIS — M25562 Pain in left knee: Secondary | ICD-10-CM | POA: Diagnosis not present

## 2020-05-28 DIAGNOSIS — M7042 Prepatellar bursitis, left knee: Secondary | ICD-10-CM | POA: Diagnosis not present

## 2020-06-04 DIAGNOSIS — I700 Atherosclerosis of aorta: Secondary | ICD-10-CM | POA: Diagnosis not present

## 2020-06-04 DIAGNOSIS — N200 Calculus of kidney: Secondary | ICD-10-CM | POA: Diagnosis not present

## 2020-06-04 DIAGNOSIS — K7689 Other specified diseases of liver: Secondary | ICD-10-CM | POA: Diagnosis not present

## 2020-06-04 DIAGNOSIS — R310 Gross hematuria: Secondary | ICD-10-CM | POA: Diagnosis not present

## 2020-06-17 DIAGNOSIS — R918 Other nonspecific abnormal finding of lung field: Secondary | ICD-10-CM | POA: Diagnosis not present

## 2020-06-17 DIAGNOSIS — J432 Centrilobular emphysema: Secondary | ICD-10-CM | POA: Diagnosis not present

## 2020-06-20 ENCOUNTER — Other Ambulatory Visit: Payer: Self-pay

## 2020-06-20 ENCOUNTER — Encounter: Payer: Self-pay | Admitting: Cardiovascular Disease

## 2020-06-20 ENCOUNTER — Ambulatory Visit (INDEPENDENT_AMBULATORY_CARE_PROVIDER_SITE_OTHER): Payer: Medicare Other | Admitting: Cardiovascular Disease

## 2020-06-20 DIAGNOSIS — I495 Sick sinus syndrome: Secondary | ICD-10-CM

## 2020-06-20 DIAGNOSIS — E785 Hyperlipidemia, unspecified: Secondary | ICD-10-CM

## 2020-06-20 DIAGNOSIS — I4729 Other ventricular tachycardia: Secondary | ICD-10-CM

## 2020-06-20 DIAGNOSIS — I472 Ventricular tachycardia, unspecified: Secondary | ICD-10-CM

## 2020-06-20 DIAGNOSIS — I5032 Chronic diastolic (congestive) heart failure: Secondary | ICD-10-CM

## 2020-06-20 DIAGNOSIS — I428 Other cardiomyopathies: Secondary | ICD-10-CM | POA: Diagnosis not present

## 2020-06-20 DIAGNOSIS — N1831 Chronic kidney disease, stage 3a: Secondary | ICD-10-CM

## 2020-06-20 DIAGNOSIS — I25118 Atherosclerotic heart disease of native coronary artery with other forms of angina pectoris: Secondary | ICD-10-CM

## 2020-06-20 MED ORDER — ASPIRIN EC 81 MG PO TBEC
81.0000 mg | DELAYED_RELEASE_TABLET | Freq: Every day | ORAL | 3 refills | Status: DC
Start: 2020-06-20 — End: 2021-10-05

## 2020-06-20 NOTE — Progress Notes (Signed)
Cardiology Office Note    Date:  06/25/2020   ID:  [name], DOB Apr 22, 1941, MRN [id number]  PCP:  Lucianne Lei, MD  Cardiologist:  Shelva Majestic, MD   No chief complaint on file.  7 month F/U  History of Present Illness:  [name] is a 79 y.o. male  whohas a history of a dilated cardiomyopathy with remote ejection fractions in the 35-45% range. In December 2012 he presented with acute congestive heart failure requiring BiPAP therapy and during his hospitalization ejection fraction dropped to 20-25%. He ruled in for non-ST segment elevation MI and was not found to have high-grade obstructive CAD. He did wear life-vest transiently and with improvement of his ejection fraction to approximately 35-40% in April 2013 his life as was discontinued. He also has a history of intermittent second-degree AV block lead leading to discontinuance of his beta blocker therapy and reduction of his amiodarone dose. An echo Doppler study in March 2014 revealed an ejection fraction that had increased to approximately 55%. 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  He was hospitalized in May 2016 with chest pain. His cardiac enzymes were negative for MI. He had an exercise Myoview study on 04/10/2015 and on the stress portion of the study he had episodes of nonsustained VT. He did not have chest pain or diagnostic ECG changes and the result was felt to be low risk. During that hospitalization a follow-up echo Doppler study showed an ejection fraction that had improved to 45-50% with inferior hypokinesis. There was mild LVH and grade 1 diastolic dysfunction. During his hospitalization he did have some bradycardia.  He was evaluated in th ER on 01/01/17 for palpitaions and was felt to have an URI. He was febrile to 100.6. Laboratory showed a Cr 1.74 which had increased from 1.37 10 months ago. He denies any chest pain. He denies PND or orthopnea. He is unaware of any recurrent palpitations. He denies  significant edema.   He has noticed very rare twinges of atypical chest pain which is short-lived and nonexertional and resolve spontaneously. He has been taking spironolactone 12.5 mg daily, furosemide 20 mg every other day, amlodipine 10 mg in addition to isosorbide 30 mg. He denies any anginal type symptomatology. His has not had any significant edema on this regimen. Laboratory showed improvement in his creatinine at 1.37 from 1.74 in February 2018. Lipid studies were excellent, not on therapy with a total cholesterol 111, triglycerides 42, HDL 50, and LDL 53. He sees Dr. Criss Rosales at three-month intervals.   He was evaluated in the emergency room in December 2018. He had some chest discomfort. There were no acute findings. Chest x-ray was negative. Troponins were negative 2. Subsequently, he has felt well.   I last saw him on Apr 07, 2018. At that time, he remained asymptomatic with stable blood pressure without chest pain on amlodipine 10 mg, furosemide 20 g, isosorbide 30 mg daily in addition to spironolactone 12.5 mg daily. His ECG at that time showed sinus rhythm with PVCs in a bigeminal rhythm for which he was asymptomatic.  He underwent an echo Doppler study on Apr 13, 2018 which showed normal LV function without wall motion abnormality, grade 2 diastolic dysfunction, mild biatrial enlargement, and mild pulmonary hypertension with a peak PA pressure 39 mm.  Laboratory in May 2019 had shown normal TSH of 1.27. He has chronic kidney disease stage III with creatinine 1.6. Magnesium level was normal.   During my  evaluation in  July 2019 he was asymptomatic with reference to dizziness or lightheadedness but his heart rate was in the 30s and he appeared to have second-degree block intermittent junctional rhythm and I suspected significant sick sinus syndrome.  I referred him to Dr. Sallyanne Kuster for further evaluation.  He was admitted on July 12, 2018 and underwent insertion of  a new dual-chamber permanent pacemaker for symptomatic bradycardia,2nd degree AV block, and sinus node dysfunction by Dr. Sallyanne Kuster. Subsequently, when last seen by Dr. Orene Desanctis he had developed 2 very brief episodes of PAT and had a  short episode of PAF.  I last evaluated him in a telemedicine visit in May 2020.  At that time he remained stable with reference to his nonischemic cardiomyopathy.  However blood pressure was elevated and I further titrated Toprol-XL to 37.5 mg. He denied any chest pain or shortness of breath.  There was no leg swelling.  He has not been able to walk with the COVID-19 pandemic.  At times he notes a rare palpitation. Of note, he was found to have an episode of PAF on remote monitoring. Dr. Sallyanne Kuster had discussed this with him and with the patient's desire not to start anticoagulation therapy the decision was made not to institute anticoagulation with his burden less than 1%.  He subsequently was evaluated in June 2020 by Roby Lofts and with recurrent PAF captured on interrogation with the longest episode lasting 4 hours and a CHA2DS2-VASc score of 5 aspirin was discontinued and he was started on Eliquis 2.5 mg twice a day with his creatinine greater than 1.5. and age of 72.  He saw Dr. Sallyanne Kuster for pacemaker follow-up on October 22, 2019.  He was feeling well and only had mild shortness of breath if he try to run or walk very fast.  Pacemaker interrogation showed sinus bradycardia with second-degree AV block with Mobitz type I with ventricular rate in the high 30s to low 40s as his underlying rhythm.  He had 89% atrial pacing and 95% ventricular pacing.  Atrial fibrillation burden remains very low at less than 1% but he had several episodes that were prolonged episodes of asymptomatic PAF longest lasting >3 hours.  I last saw him on October 25, 2019 at which time he felt well and denied any chest pain, PND orthopnea.  He denied any presyncope or syncope.    Since my last  evaluation, he was hospitalized from December 15 through November 09, 2019 after presenting with sudden loss of consciousness without any prodrome of chest pain prior to the episode.  He was felt to have syncope with VT.  During that hospitalization he underwent repeat cardiac catheterization on November 07, 2019 by Dr. Fletcher Anon which showed stable 80% stenosis in the diagonal branch, unchanged from 2009.  Remotely had undergone a prior pacemaker insertion and had undergone ablation for PVCs.  During his hospitalization in December, he underwent a BiV ICD upgrade and had a subtotally occluded SCV was traversed with a Glidewire by Dr. Crissie Sickles.  He saw Dr. Lovena Le in follow-up on February 20, 2020 and he remained asymptomatic.  His chronic systolic heart failure was well controlled and he was euvolemic with class II symptoms.  He was maintaining sinus rhythm without recurrent PAF, no longer on amiodarone.  Over the last several months, he has felt well.  He had sustained a left fibula metatarsal fracture earlier this year.  Apparently the patient stopped taking Eliquis in November 2020.  He denies S pain.  At times  there is some very minimal shortness of breath.  He is unaware of palpitations.  He presents for reevaluation.  Past Medical History:  Diagnosis Date  . CAD (coronary artery disease) 80% stenosis diag of the LAD, 30% in OM2 branch of LCX in 2009    a. Nonobstructive CAD by cath 11/2011 with the exception of the pre-existing diagonal branch #2 lesion.  . Chronic systolic CHF (congestive heart failure) ([place])   . CKD (chronic kidney disease) stage 3, GFR 30-59 ml/min   . Colon polyp, hyperplastic   . History of stress test 06/01/2012   Normal myocardial perfusion study. compared to the previous study there is no significant change. this is a low risk scan  . Hypertension   . Legally blind    "both eyes"  . Myocardial infarction ([place]) 11/22/11  . NICM (nonischemic cardiomyopathy) ([place])    a.  Remote hx of dilated NICM with EF ranging 20-45%, including normal EF by echo (55-60%) in 2014.  Marland Kitchen Peripheral arterial disease ([place])    a. 06/2014: ABI right 0.99, left 1.2, LE dopplers revealing an occluded right posterior tibial. As symptoms were not felt r/t claudication, no further w/u at the time.  Marland Kitchen PVC's (premature ventricular contractions)   . 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 Requiring discontinuation of BB dose.  Marland Kitchen Spondylolisthesis   . Ventricular bigeminy   . Ventricular tachycardia (paroxysmal) ([place]) 04/11/2015    Past Surgical History:  Procedure Laterality Date  . BACK SURGERY    . BIV UPGRADE N/A 11/08/2019   Procedure: BIV UPGRADE;  Surgeon: Evans Lance, MD;  Location: [facility];  Service: Cardiovascular;  Laterality: N/A;  . CARDIAC CATHETERIZATION  11/2011  . CARDIAC CATHETERIZATION  11/2011   didn't demonstrate high grade obstructive disease to account for his LV dysfunction.  Marland Kitchen CATARACT EXTRACTION, BILATERAL  1990's  . CYSTOSCOPY    . CYSTOSCOPY WITH URETHRAL DILATATION N/A 05/04/2013   Procedure: CYSTOSCOPY WITH URETHRAL DILATATION;  Surgeon: Eustace Moore, MD;  Location: [facility];  Service: Neurosurgery;  Laterality: N/A;  with insertion of foley catheter  . EP study and ablation of VT  7/13   PVC focus mapped to the right coronary cusp of the aorta, limited ablation performed due to proximity of the focus to the right coronary artery  . EYE SURGERY    . LEFT HEART CATH AND CORONARY ANGIOGRAPHY N/A 11/07/2019   Procedure: LEFT HEART CATH AND CORONARY ANGIOGRAPHY;  Surgeon: Wellington Hampshire, MD;  Location: [facility];  Service: Cardiovascular;  Laterality: N/A;  . LEFT HEART CATHETERIZATION WITH CORONARY ANGIOGRAM N/A 11/25/2011   Procedure: LEFT HEART CATHETERIZATION WITH CORONARY ANGIOGRAM;  Surgeon: Leonie Man, MD;  Location: [facility];  Service: Cardiovascular;  Laterality: N/A;  . PACEMAKER IMPLANT N/A 07/12/2018    Procedure: PACEMAKER IMPLANT;  Surgeon: Sanda Klein, MD;  Location: [facility];  Service: Cardiovascular;  Laterality: N/A;  . POSTERIOR FUSION [place]  . ROTATOR CUFF REPAIR  2000's   left  . V-TACH ABLATION N/A 06/06/2012   Procedure: V-TACH ABLATION;  Surgeon: Thompson Grayer, MD;  Location: [facility] CATH LAB;  Service: Cardiovascular;  Laterality: N/A;    Current Medications: Outpatient Medications Prior to Visit  Medication Sig Dispense Refill  . albuterol (VENTOLIN HFA) 108 (90 Base) MCG/ACT inhaler Inhale into the lungs.    Marland Kitchen BREO ELLIPTA 100-25 MCG/INH AEPB 1 puff every morning.    Marland Kitchen  cephALEXin (KEFLEX) 500 MG capsule     . dorzolamide-timolol (COSOPT) 22.3-6.8 MG/ML ophthalmic solution     . furosemide (LASIX) 20 MG tablet Take by mouth.    Marland Kitchen HYDROcodone-acetaminophen (NORCO/VICODIN) 5-325 MG tablet Take 1 tablet by mouth every 4 (four) hours as needed. 10 tablet 0  . isosorbide mononitrate (IMDUR) 30 MG 24 hr tablet Take 1 tablet by mouth daily.    Marland Kitchen lidocaine (LIDODERM) 5 % Place 1 patch onto the skin daily. Remove & Discard patch within 12 hours or as directed by MD 5 patch 0  . LUMIGAN 0.01 % SOLN Place 1 drop into both eyes at bedtime.   0  . metoprolol succinate (TOPROL-XL) 100 MG 24 hr tablet Take 1 tablet (100 mg total) by mouth daily. Take with or immediately following a meal. 90 tablet 2  . nitroGLYCERIN (NITROSTAT) 0.4 MG SL tablet DISSOLVE 1 TABLET UNDER THE TONGUE EVERY 5 MINUTES AS NEEDED FOR CHEST PAIN    . RESTASIS 0.05 % ophthalmic emulsion Place 1 drop into both eyes daily.  11  . spironolactone (ALDACTONE) 25 MG tablet Take by mouth.    Marland Kitchen aspirin 325 MG tablet Take 325 mg by mouth daily.    Marland Kitchen apixaban (ELIQUIS) 2.5 MG TABS tablet Take 1 tablet (2.5 mg total) by mouth 2 (two) times daily. Resume Sunday with the EVENING dose. (Patient not taking: Reported on 06/20/2020) 60 tablet 6   No facility-administered medications prior to visit.      Allergies:   Patient has no known allergies.   Social History   Socioeconomic History  . Marital status: Legally Separated    Spouse name: Not on file  . Number of children: 3  . Years of education: Not on file  . Highest education level: Not on file  Occupational History  . Occupation: Retired    Fish farm manager: RETIRED  Tobacco Use  . Smoking status: Former Smoker    Years: 50.00    Types: Cigarettes    Quit date: 11/23/1999    Years since quitting: 20.6  . Smokeless tobacco: Never Used  Vaping Use  . Vaping Use: Never used  Substance and Sexual Activity  . 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 Drug use: No  . Sexual activity: Yes  Other Topics Concern  . Not on file  Social History Narrative  . Not on file   Social Determinants of Health   Financial Resource Strain:   . Difficulty of Paying Living Expenses:   Food Insecurity:   . Worried About Charity fundraiser in the Last Year:   . Arboriculturist in the Last Year:   Transportation Needs:   . Film/video editor (Medical):   Marland Kitchen Lack of Transportation (Non-Medical):   Physical Activity:   . Days of Exercise per Week:   . Minutes of Exercise per Session:   Stress:   . Feeling of Stress :   Social Connections:   . Frequency of Communication with Friends and Family:   . Frequency of Social Gatherings with Friends and Family:   . Attends Religious Services:   . Active Member of Clubs or Organizations:   . Attends Archivist Meetings:   Marland Kitchen Marital Status:     Socially, he is divorced and has 4 children, and 7 grandchildren.  Family History:  The patient's family history includes Asthma in his mother; Other  in an other family member.   ROS General: Negative; No fevers, chills, or night sweats;  HEENT: Negative; No changes in vision or hearing, sinus congestion, difficulty swallowing Pulmonary: Negative; No cough, wheezing, shortness of  breath, hemoptysis Cardiovascular: Negative; No chest pain, presyncope, syncope, palpitations GI: Negative; No nausea, vomiting, diarrhea, or abdominal pain GU: Negative; No dysuria, hematuria, or difficulty voiding Musculoskeletal: Negative; no myalgias, joint pain, or weakness Hematologic/Oncology: Negative; no easy bruising, bleeding Endocrine: Negative; no heat/cold intolerance; no diabetes Neuro: Negative; no changes in balance, headaches Skin: Negative; No rashes or skin lesions Psychiatric: Negative; No behavioral problems, depression Sleep: Negative; No snoring, daytime sleepiness, hypersomnolence, bruxism, restless legs, hypnogognic hallucinations, no cataplexy Other comprehensive 14 point system review is negative.   PHYSICAL EXAM:   VS:  BP (!) 142/90   Pulse 76   Temp 97.7 F (36.5 C)   Ht '5\' 8"'  (1.727 m)   Wt 171 lb (77.6 kg)   SpO2 97%   BMI 26.00 kg/m     Repeat blood pressure by me was 124/80  Wt Readings from Last 3 Encounters:  06/20/20 171 lb (77.6 kg)  04/19/20 173 lb (78.5 kg)  04/18/20 166 lb 0.1 oz (75.3 kg)    General: Alert, oriented, no distress.  Skin: normal turgor, no rashes, warm and dry HEENT: Normocephalic, atraumatic. Pupils equal round and reactive to light; sclera anicteric; extraocular muscles intact;  Nose without nasal septal hypertrophy Mouth/Parynx benign; Mallinpatti scale Neck: No JVD, no carotid bruits; normal carotid upstroke Lungs: clear to ausculatation and percussion; no wheezing or rales Chest wall: without tenderness to palpitation Heart: PMI not displaced, RRR, s1 s2 normal, 1/6 systolic murmur, no diastolic murmur, no rubs, gallops, thrills, or heaves Abdomen: soft, nontender; no hepatosplenomehaly, BS+; abdominal aorta nontender and not dilated by palpation. Back: no CVA tenderness Pulses 2+ Musculoskeletal: full range of motion, normal strength, no joint deformities Extremities: no clubbing cyanosis or edema, Homan's  sign negative  Neurologic: grossly nonfocal; Cranial nerves grossly wnl Psychologic: Normal mood and affect   Studies/Labs Reviewed:   EKG:  EKG is ordered today. ECG (independently read by me): AV paced rhythm with occasional V paced complexes, PVC.  PR 180 ms.  December 01/2019 ECG (independently read by me): AV paced rhythm at 70 bpm with 100% capture.  PR interval 184 ms, QTc interval 486 ms.  Recent Labs: BMP Latest Ref Rng & Units 04/19/2020 12/19/2019 11/09/2019  Glucose 70 - 99 mg/dL 113(H) 97 107(H)  BUN 8 - 23 mg/dL '20 20 17  ' Creatinine 0.61 - 1.24 mg/dL 1.51(H) 1.80(H) 1.46(H)  BUN/Creat Ratio 10 - 24 - - -  Sodium 135 - 145 mmol/L 141 139 138  Potassium 3.5 - 5.1 mmol/L 3.9 4.2 4.0  Chloride 98 - 111 mmol/L 108 108 108  CO2 22 - 32 mmol/L 26 23 21(L)  Calcium 8.9 - 10.3 mg/dL 9.5 8.6(L) 8.7(L)     Hepatic Function Latest Ref Rng & Units 11/08/2019 04/13/2019 04/07/2018  Total Protein 6.5 - 8.1 g/dL 6.9 7.1 7.1  Albumin 3.5 - 5.0 g/dL 3.3(L) 4.0 4.1  AST 15 - 41 U/L '23 18 17  ' ALT 0 - 44 U/L '19 16 16  ' Alk Phosphatase 38 - 126 U/L 54 76 73  Total Bilirubin 0.3 - 1.2 mg/dL 1.3(H) 0.4 0.6  Bilirubin, Direct 0.0 - 0.3 mg/dL - - -    CBC Latest Ref Rng & Units 04/19/2020 12/19/2019 11/09/2019  WBC 4.0 - 10.5 K/uL 5.3 3.0(L)  5.8  Hemoglobin 13.0 - 17.0 g/dL 14.5 14.2 14.1  Hematocrit 39 - 52 % 44.7 44.5 41.4  Platelets 150 - 400 K/uL 153 77(L) 122(L)   Lab Results  Component Value Date   MCV 92.0 04/19/2020   MCV 94.9 12/19/2019   MCV 90.2 11/09/2019   Lab Results  Component Value Date   TSH 1.520 04/13/2019   Lab Results  Component Value Date   HGBA1C 5.9 (H) 05/26/2012     BNP    Component Value Date/Time   BNP 49.9 04/09/2015 1217    ProBNP    Component Value Date/Time   PROBNP 155.4 (H) 03/19/2013 1749     Lipid Panel     Component Value Date/Time   CHOL 107 04/13/2019 1123   TRIG 85 04/13/2019 1123   HDL 50 04/13/2019 1123   CHOLHDL 2.1  04/13/2019 1123   CHOLHDL 2.2 01/24/2017 0950   VLDL 8 01/24/2017 0950   LDLCALC 40 04/13/2019 1123   LABVLDL 17 04/13/2019 1123     RADIOLOGY: No results found.   Additional studies/ records that were reviewed today include:  Study Conclusions  - Left ventricle: The cavity size was normal. Wall thickness was   normal. Systolic function was normal. The estimated ejection   fraction was in the range of 50% to 55%. Wall motion was normal;   there were no regional wall motion abnormalities. Features are   consistent with a pseudonormal left ventricular filling pattern,   with concomitant abnormal relaxation and increased filling   pressure (grade 2 diastolic dysfunction). - Left atrium: The atrium was mildly dilated. - Right ventricle: The cavity size was mildly dilated. - Right atrium: The atrium was mildly dilated. - Pulmonary arteries: Systolic pressure was mildly increased. PA   peak pressure: 39 mm Hg (S).  Impressions:  - Normal LV systolic function; mild diastolic dysfunction; mild   LAE; mild RAE/RVE; mild TR with mild pulmonary hypertension.  ------------------------------------------------------------------- Labs, prior tests, procedures, and surgery: Transthoracic echocardiography (04/10/2015).     EF was 45%.   ASSESSMENT:    1. Chronic diastolic CHF (congestive heart failure) ([place])   2. Nonischemic cardiomyopathy ([place])   3. Ventricular tachycardia (paroxysmal) ([place])   4. Tachycardia-bradycardia syndrome ([place])   5. Coronary artery disease of native artery of native heart with stable angina pectoris (HCC)   6. Stage 3a chronic kidney disease   7. Hyperlipidemia with target LDL less than 70      PLAN:  [name] is an 79 year old gentleman who has a history of mild CAD, heart failure with reduced EF in December 2012 requiring BiPAP therapy with EF at 20 to 25%.  He had worn a LifeVest transiently and EF ultimately improved to 35 to 40% in 2013 and  LifeVest was discontinued.  He has had prior pacemaker insertion and also ablation for PVCs.  He has a history of paroxysmal atrial fibrillation for which she had been on Eliquis.  Apparently he had been off Eliquis since November 2020.  He developed an episode of VT leading to syncope in December and ultimately underwent repeat catheterization which essentially was unchanged and showed stable 80% stenosis in a diagonal branch of his LAD with mild 30% narrowing in the LAD.  There was minimal plaque in the circumflex and RCA.  He underwent BiV ICD upgrade by Dr. Lovena Le subsequently has been doing well without recurrent VT or atrial fibrillation.  Presently he is not having any anginal symptomatology.  His blood pressure  today is stable at 124/80 on his regimen consisting of furosemide 20 mg, Toprol all succinate 100 mg, spironolactone 25 mg in addition to isosorbide 30 mg daily.  Since he stopped taking apixaban he began taking aspirin 325 mg daily.  I have recommended he reduce his aspirin dose to 81 mg.  He has a history of hyperlipidemia and had been on pravastatin but it appears that he is not on this presently.  Over the year he also had fractured his left fibula and fifth metatarsal and his mobility has improved.  I am recommending he undergo a follow-up echo Doppler study to reassess systolic and diastolic function on current therapy following his BiV ICD upgrade.  He is scheduled to undergo remote defibrillator checks.  He will be having follow-up laboratory.  Target LDL is less than 70.  I will see him in 6 months for reevaluation.    Medication Adjustments/Labs and Tests Ordered: Current medicines are reviewed at length with the patient today.  Concerns regarding medicines are outlined above.  Medication changes, Labs and Tests ordered today are listed in the Patient Instructions below. Patient Instructions  Medication Instructions:  DECREASE YOUR ASPIRIN TO 81MG DAILY - CAN TAKE OTC *If you need  a refill on your cardiac medications before your next appointment, please call your pharmacy*   Testing/Procedures: Your physician has requested that you have an echocardiogram. Echocardiography is a painless test that uses sound waves to create images of your heart. It provides your doctor with information about the size and shape of your heart and how well your heart's chambers and valves are working. This procedure takes approximately one hour. There are no restrictions for this procedure.     Follow-Up: At [facility], you and your health needs are our priority.  As part of our continuing mission to provide you with exceptional heart care, we have created designated Provider Care Teams.  These Care Teams include your primary Cardiologist (physician) and Advanced Practice Providers (APPs -  Physician Assistants and Nurse Practitioners) who all work together to provide you with the care you need, when you need it.  We recommend signing up for the patient portal called "MyChart".  Sign up information is provided on this After Visit Summary.  MyChart is used to connect with patients for Virtual Visits (Telemedicine).  Patients are able to view lab/test results, encounter notes, upcoming appointments, etc.  Non-urgent messages can be sent to your provider as well.   To learn more about what you can do with MyChart, go to [email or web address].    Your next appointment:   6 month(s)  The format for your next appointment:   In Person  Provider:   Shelva Majestic, MD      Signed, Shelva Majestic, MD  06/25/2020 3:31 PM    [facility] [address], [place], [place], [place]  [address] Phone: [phone]

## 2020-06-20 NOTE — Patient Instructions (Signed)
Medication Instructions:  DECREASE YOUR ASPIRIN TO 81MG  DAILY - CAN TAKE OTC *If you need a refill on your cardiac medications before your next appointment, please call your pharmacy*   Testing/Procedures: Your physician has requested that you have an echocardiogram. Echocardiography is a painless test that uses sound waves to create images of your heart. It provides your doctor with information about the size and shape of your heart and how well your heart's chambers and valves are working. This procedure takes approximately one hour. There are no restrictions for this procedure.     Follow-Up: At [facility], you and your health needs are our priority.  As part of our continuing mission to provide you with exceptional heart care, we have created designated Provider Care Teams.  These Care Teams include your primary Cardiologist (physician) and Advanced Practice Providers (APPs -  Physician Assistants and Nurse Practitioners) who all work together to provide you with the care you need, when you need it.  We recommend signing up for the patient portal called "MyChart".  Sign up information is provided on this After Visit Summary.  MyChart is used to connect with patients for Virtual Visits (Telemedicine).  Patients are able to view lab/test results, encounter notes, upcoming appointments, etc.  Non-urgent messages can be sent to your provider as well.   To learn more about what you can do with MyChart, go to [email or web address].    Your next appointment:   6 month(s)  The format for your next appointment:   In Person  Provider:   Shelva Majestic, MD

## 2020-06-25 ENCOUNTER — Encounter: Payer: Self-pay | Admitting: Cardiovascular Disease

## 2020-07-01 DIAGNOSIS — R310 Gross hematuria: Secondary | ICD-10-CM | POA: Diagnosis not present

## 2020-07-07 DIAGNOSIS — E785 Hyperlipidemia, unspecified: Secondary | ICD-10-CM | POA: Diagnosis not present

## 2020-07-07 DIAGNOSIS — N189 Chronic kidney disease, unspecified: Secondary | ICD-10-CM | POA: Diagnosis not present

## 2020-07-07 DIAGNOSIS — I10 Essential (primary) hypertension: Secondary | ICD-10-CM | POA: Diagnosis not present

## 2020-07-07 DIAGNOSIS — I459 Conduction disorder, unspecified: Secondary | ICD-10-CM | POA: Diagnosis not present

## 2020-07-07 DIAGNOSIS — Z0000 Encounter for general adult medical examination without abnormal findings: Secondary | ICD-10-CM | POA: Diagnosis not present

## 2020-07-08 ENCOUNTER — Ambulatory Visit (HOSPITAL_COMMUNITY): Payer: Medicare Other | Attending: Internal Medicine

## 2020-07-08 ENCOUNTER — Other Ambulatory Visit: Payer: Self-pay

## 2020-07-08 DIAGNOSIS — I5032 Chronic diastolic (congestive) heart failure: Secondary | ICD-10-CM | POA: Insufficient documentation

## 2020-07-08 DIAGNOSIS — I495 Sick sinus syndrome: Secondary | ICD-10-CM | POA: Diagnosis not present

## 2020-07-08 DIAGNOSIS — I472 Ventricular tachycardia: Secondary | ICD-10-CM | POA: Insufficient documentation

## 2020-07-08 DIAGNOSIS — I4729 Other ventricular tachycardia: Secondary | ICD-10-CM

## 2020-07-08 LAB — ECHOCARDIOGRAM COMPLETE
Area-P 1/2: 3.72 cm2
S' Lateral: 4.4 cm

## 2020-07-08 MED ORDER — PERFLUTREN LIPID MICROSPHERE
1.0000 mL | INTRAVENOUS | Status: AC | PRN
  Administered 2020-07-08: 1 mL via INTRAVENOUS

## 2020-07-09 ENCOUNTER — Other Ambulatory Visit: Payer: Self-pay | Admitting: Cardiovascular Disease

## 2020-07-09 NOTE — Telephone Encounter (Signed)
Rx has been sent to the pharmacy electronically. ° °

## 2020-07-22 DIAGNOSIS — I130 Hypertensive heart and chronic kidney disease with heart failure and stage 1 through stage 4 chronic kidney disease, or unspecified chronic kidney disease: Secondary | ICD-10-CM | POA: Diagnosis not present

## 2020-07-22 DIAGNOSIS — N1831 Chronic kidney disease, stage 3a: Secondary | ICD-10-CM | POA: Diagnosis not present

## 2020-07-22 DIAGNOSIS — E785 Hyperlipidemia, unspecified: Secondary | ICD-10-CM | POA: Diagnosis not present

## 2020-07-22 DIAGNOSIS — I5032 Chronic diastolic (congestive) heart failure: Secondary | ICD-10-CM | POA: Diagnosis not present

## 2020-08-27 ENCOUNTER — Ambulatory Visit (INDEPENDENT_AMBULATORY_CARE_PROVIDER_SITE_OTHER): Payer: Medicare Other | Admitting: Podiatry

## 2020-08-27 ENCOUNTER — Encounter: Payer: Self-pay | Admitting: Podiatry

## 2020-08-27 ENCOUNTER — Other Ambulatory Visit: Payer: Self-pay

## 2020-08-27 DIAGNOSIS — B351 Tinea unguium: Secondary | ICD-10-CM

## 2020-08-27 DIAGNOSIS — N1831 Chronic kidney disease, stage 3a: Secondary | ICD-10-CM

## 2020-08-27 DIAGNOSIS — M79675 Pain in left toe(s): Secondary | ICD-10-CM

## 2020-08-27 DIAGNOSIS — D689 Coagulation defect, unspecified: Secondary | ICD-10-CM

## 2020-08-27 DIAGNOSIS — I739 Peripheral vascular disease, unspecified: Secondary | ICD-10-CM

## 2020-08-27 DIAGNOSIS — M79674 Pain in right toe(s): Secondary | ICD-10-CM

## 2020-08-27 NOTE — Progress Notes (Signed)
This patient returns to my office for at risk foot care.  This patient requires this care by a professional since this patient will be at risk due to having , PAD and  Chronic kidney disease.  .  This patient is unable to cut nails himself since the patient cannot reach his nails.These nails are painful walking and wearing shoes.  This patient presents for at risk foot care today.  General Appearance  Alert, conversant and in no acute stress.  Vascular  Dorsalis pedis and posterior tibial  pulses are palpable  bilaterally.  Capillary return is within normal limits  bilaterally. Temperature is within normal limits  bilaterally.  Neurologic  Senn-Weinstein monofilament wire test within normal limits  bilaterally. Muscle power within normal limits bilaterally.  Nails Thick disfigured discolored nails with subungual debris  from hallux to fifth toes bilaterally. No evidence of bacterial infection or drainage bilaterally.  Orthopedic  No limitations of motion  feet .  No crepitus or effusions noted.  No bony pathology or digital deformities noted.  Skin  normotropic skin with no porokeratosis noted bilaterally.  No signs of infections or ulcers noted.     Onychomycosis  Pain in right toes  Pain in left toes  Consent was obtained for treatment procedures.   Mechanical debridement of nails 1-5  bilaterally performed with a nail nipper.  Filed with dremel without incident.    Return office visit   3 months                   Told patient to return for periodic foot care and evaluation due to potential at risk complications.   Gardiner Barefoot DPM

## 2020-09-04 ENCOUNTER — Ambulatory Visit (INDEPENDENT_AMBULATORY_CARE_PROVIDER_SITE_OTHER): Payer: Medicare Other | Admitting: Emergency Medicine

## 2020-09-04 ENCOUNTER — Other Ambulatory Visit: Payer: Self-pay

## 2020-09-04 ENCOUNTER — Encounter: Payer: Self-pay | Admitting: Emergency Medicine

## 2020-09-04 VITALS — BP 124/88 | HR 76 | Temp 97.7°F | Ht 68.0 in | Wt 173.4 lb

## 2020-09-04 DIAGNOSIS — R9389 Abnormal findings on diagnostic imaging of other specified body structures: Secondary | ICD-10-CM

## 2020-09-04 DIAGNOSIS — J449 Chronic obstructive pulmonary disease, unspecified: Secondary | ICD-10-CM | POA: Diagnosis not present

## 2020-09-04 DIAGNOSIS — R911 Solitary pulmonary nodule: Secondary | ICD-10-CM

## 2020-09-04 NOTE — Assessment & Plan Note (Signed)
Atelectasis versus infiltrate with a somewhat groundglass appearance, more nodular appearance in the lingula.  Need to follow CT now for interval change, resolution.  If suspicion present for enlarging nodular disease then we will arrange for bronchoscopy and tissue diagnosis.  Discussed possible bronchoscopy with him today depending on the findings.

## 2020-09-04 NOTE — Assessment & Plan Note (Signed)
Presumed COPD based on his history.  No PFT available currently.  He has benefited from [place].  Likely needs PFT at some point going forward.  We could consider changing him to LABA/LAMA to see if he gets more benefit.  Will discuss next time.

## 2020-09-04 NOTE — Progress Notes (Signed)
Subjective:    Patient ID: [name], male    DOB: [birth year], 79 y.o.   MRN: [id number]  HPI 79 year old former smoker with CAD and a nonischemic cardiomyopathy, hypertension, peripheral vascular disease, blindness, chronic kidney disease.  He is referred today for dyspnea and for evaluation of abnormal Ct chest.  He suffered a trauma in January, CT chest abdomen pelvis performed, suffered rib fractures left eighth and ninth.  Currently managed on Breo for presumed COPD. No pulmonary function testing  CT chest from 12/19/2019 showed areas of atelectatic change and/or infiltrate in the bilateral upper lobes in the posterior aspect lower lobes, more prominent with an almost nodular appearance along the inferior aspect of left upper lobe.  There is no pneumothorax.  Mild lymphadenopathy in the AP window   Review of Systems As per HPI  Past Medical History:  Diagnosis Date   CAD (coronary artery disease) 80% stenosis diag of the LAD, 30% in OM2 branch of LCX in 2009    a. Nonobstructive CAD by cath 11/2011 with the exception of the pre-existing diagonal branch #2 lesion.   Chronic systolic CHF (congestive heart failure) (HCC)    CKD (chronic kidney disease) stage 3, GFR 30-59 ml/min (HCC)    Colon polyp, hyperplastic    History of stress test 06/01/2012   Normal myocardial perfusion study. compared to the previous study there is no significant change. this is a low risk scan   Hypertension    Legally blind    "both eyes"   Myocardial infarction [facility]) 11/22/11   NICM (nonischemic cardiomyopathy) ([place])    a. Remote hx of dilated NICM with EF ranging 20-45%, including normal EF by echo (55-60%) in 2014.   Peripheral arterial disease ([place])    a. 06/2014: ABI right 0.99, left 1.2, LE dopplers revealing an occluded right posterior tibial. As symptoms were not felt r/t claudication, no further w/u at the time.   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 Requiring discontinuation of BB dose.   Spondylolisthesis    Ventricular bigeminy    Ventricular tachycardia (paroxysmal) ([place]) 04/11/2015     Family History  Problem Relation Age of Onset   Asthma Mother    Other Other        Unsure if any heart disease in his family.     Social History   Socioeconomic History   Marital status: Legally Separated    Spouse name: Not on file   Number of children: 3   Years of education: Not on file   Highest education level: Not on file  Occupational History   Occupation: Retired    Fish farm manager: RETIRED  Tobacco Use   Smoking status: Former Smoker    Years: 50.00    Types: Cigarettes    Quit date: 11/23/1999    Years since quitting: 20.7   Smokeless tobacco: Never Used  Scientific laboratory technician Use: Never used  Substance and Sexual Activity   Alcohol use: No    Comment: 05/26/12 "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; stopped drinking in the early 1980's"   Drug use: No   Sexual activity: Yes  Other Topics Concern   Not on file  Social History Narrative   Not on file   Social Determinants of Health   Financial Resource Strain:    Difficulty of Paying Living Expenses: Not on file  Food Insecurity:    Worried About Running Out of  Food in the Last Year: Not on file   Ran Out of Food in the Last Year: Not on file  Transportation Needs:    Lack of Transportation (Medical): Not on file   Lack of Transportation (Non-Medical): Not on file  Physical Activity:    Days of Exercise per Week: Not on file   Minutes of Exercise per Session: Not on file  Stress:    Feeling of Stress : Not on file  Social Connections:    Frequency of Communication with Friends and Family: Not on file   Frequency of Social Gatherings with Friends and Family: Not on file   Attends Religious Services: Not on file   Active Member of Clubs or Organizations: Not on file   Attends Archivist Meetings: Not on file    Marital Status: Not on file  Intimate Partner Violence:    Fear of Current or Ex-Partner: Not on file   Emotionally Abused: Not on file   Physically Abused: Not on file   Sexually Abused: Not on file     No Known Allergies   Outpatient Medications Prior to Visit  Medication Sig Dispense Refill   albuterol (VENTOLIN HFA) 108 (90 Base) MCG/ACT inhaler Inhale into the lungs.     aspirin EC 81 MG tablet Take 1 tablet (81 mg total) by mouth daily. Swallow whole. 90 tablet 3   BREO ELLIPTA 100-25 MCG/INH AEPB 1 puff every morning.     cephALEXin (KEFLEX) 500 MG capsule      dorzolamide-timolol (COSOPT) 22.3-6.8 MG/ML ophthalmic solution      furosemide (LASIX) 20 MG tablet Take by mouth.     HYDROcodone-acetaminophen (NORCO/VICODIN) 5-325 MG tablet Take 1 tablet by mouth every 4 (four) hours as needed. 10 tablet 0   isosorbide mononitrate (IMDUR) 30 MG 24 hr tablet Take 1 tablet by mouth daily.     levofloxacin (LEVAQUIN) 750 MG tablet Take 750 mg by mouth every morning.     lidocaine (LIDODERM) 5 % Place 1 patch onto the skin daily. Remove & Discard patch within 12 hours or as directed by MD 5 patch 0   LUMIGAN 0.01 % SOLN Place 1 drop into both eyes at bedtime.   0   metoprolol succinate (TOPROL-XL) 100 MG 24 hr tablet Take 1 tablet (100 mg total) by mouth daily. Take with or immediately following a meal. 90 tablet 2   nitroGLYCERIN (NITROSTAT) 0.4 MG SL tablet DISSOLVE 1 TABLET UNDER THE TONGUE EVERY 5 MINUTES AS NEEDED FOR CHEST PAIN     RESTASIS 0.05 % ophthalmic emulsion Place 1 drop into both eyes daily.  11   spironolactone (ALDACTONE) 25 MG tablet TAKE 1/2 TABLET(12.5 MG) BY MOUTH TWICE DAILY 30 tablet 6   No facility-administered medications prior to visit.         Objective:   Physical Exam Vitals:   09/04/20 1024  BP: 124/88  Pulse: 76  Temp: 97.7 F (36.5 C)  TempSrc: Temporal  SpO2: 98%  Weight: 173 lb 6.4 oz (78.7 kg)  Height: 5\' 8"  (1.727 m)     Gen: Pleasant, well-nourished, in no distress,  normal affect  ENT: No lesions,  mouth clear,  oropharynx clear, no postnasal drip  Neck: No JVD, no stridor  Lungs: No use of accessory muscles, distant, no crackles or wheezing on normal respiration, no wheeze on forced expiration  Cardiovascular: RRR, heart sounds normal, no murmur or gallops, no peripheral edema  Musculoskeletal: No deformities, no cyanosis or  clubbing  Neuro: alert, awake, non focal  Skin: Warm, no lesions or rash     Assessment & Plan:  Abnormal CT of the chest Atelectasis versus infiltrate with a somewhat groundglass appearance, more nodular appearance in the lingula.  Need to follow CT now for interval change, resolution.  If suspicion present for enlarging nodular disease then we will arrange for bronchoscopy and tissue diagnosis.  Discussed possible bronchoscopy with him today depending on the findings.  COPD (chronic obstructive pulmonary disease) ([place]) Presumed COPD based on his history.  No PFT available currently.  He has benefited from [place].  Likely needs PFT at some point going forward.  We could consider changing him to LABA/LAMA to see if he gets more benefit.  Will discuss next time.  Baltazar Apo, MD, PhD 09/04/2020, 4:59 PM [facility] [phone] or if no answer [phone]

## 2020-09-04 NOTE — Patient Instructions (Addendum)
We will plan to repeat Ct chest now to look for interval change compared with January  Continue breo for now We will arrange for pulmonary function testing at some point in the future.  Follow with Dr Lamonte Sakai next available to review CT chest

## 2020-09-15 ENCOUNTER — Other Ambulatory Visit: Payer: Self-pay

## 2020-09-15 ENCOUNTER — Ambulatory Visit (HOSPITAL_COMMUNITY)
Admission: RE | Admit: 2020-09-15 | Discharge: 2020-09-15 | Disposition: A | Discharge status: Home / Self Care | Payer: Medicare Other | Source: Ambulatory Visit | Attending: Emergency Medicine | Admitting: Emergency Medicine

## 2020-09-15 DIAGNOSIS — R911 Solitary pulmonary nodule: Secondary | ICD-10-CM | POA: Diagnosis not present

## 2020-09-15 DIAGNOSIS — I700 Atherosclerosis of aorta: Secondary | ICD-10-CM | POA: Diagnosis not present

## 2020-09-15 DIAGNOSIS — J439 Emphysema, unspecified: Secondary | ICD-10-CM | POA: Diagnosis not present

## 2020-09-15 DIAGNOSIS — I2510 Atherosclerotic heart disease of native coronary artery without angina pectoris: Secondary | ICD-10-CM | POA: Diagnosis not present

## 2020-09-15 DIAGNOSIS — J984 Other disorders of lung: Secondary | ICD-10-CM | POA: Diagnosis not present

## 2020-10-02 DIAGNOSIS — H35033 Hypertensive retinopathy, bilateral: Secondary | ICD-10-CM | POA: Diagnosis not present

## 2020-10-02 DIAGNOSIS — H401133 Primary open-angle glaucoma, bilateral, severe stage: Secondary | ICD-10-CM | POA: Diagnosis not present

## 2020-10-02 DIAGNOSIS — H04123 Dry eye syndrome of bilateral lacrimal glands: Secondary | ICD-10-CM | POA: Diagnosis not present

## 2020-10-02 DIAGNOSIS — Z961 Presence of intraocular lens: Secondary | ICD-10-CM | POA: Diagnosis not present

## 2020-10-21 DIAGNOSIS — N1831 Chronic kidney disease, stage 3a: Secondary | ICD-10-CM | POA: Diagnosis not present

## 2020-10-21 DIAGNOSIS — I130 Hypertensive heart and chronic kidney disease with heart failure and stage 1 through stage 4 chronic kidney disease, or unspecified chronic kidney disease: Secondary | ICD-10-CM | POA: Diagnosis not present

## 2020-10-21 DIAGNOSIS — I5032 Chronic diastolic (congestive) heart failure: Secondary | ICD-10-CM | POA: Diagnosis not present

## 2020-10-21 DIAGNOSIS — E785 Hyperlipidemia, unspecified: Secondary | ICD-10-CM | POA: Diagnosis not present

## 2020-11-03 ENCOUNTER — Other Ambulatory Visit: Payer: Self-pay | Admitting: Cardiovascular Disease

## 2020-11-06 DIAGNOSIS — J4520 Mild intermittent asthma, uncomplicated: Secondary | ICD-10-CM | POA: Diagnosis not present

## 2020-11-06 DIAGNOSIS — I130 Hypertensive heart and chronic kidney disease with heart failure and stage 1 through stage 4 chronic kidney disease, or unspecified chronic kidney disease: Secondary | ICD-10-CM | POA: Diagnosis not present

## 2020-11-06 DIAGNOSIS — N1831 Chronic kidney disease, stage 3a: Secondary | ICD-10-CM | POA: Diagnosis not present

## 2020-11-06 DIAGNOSIS — I10 Essential (primary) hypertension: Secondary | ICD-10-CM | POA: Diagnosis not present

## 2020-11-06 DIAGNOSIS — I4891 Unspecified atrial fibrillation: Secondary | ICD-10-CM | POA: Diagnosis not present

## 2020-11-28 ENCOUNTER — Encounter: Payer: Self-pay | Admitting: Podiatry

## 2020-11-28 ENCOUNTER — Ambulatory Visit (INDEPENDENT_AMBULATORY_CARE_PROVIDER_SITE_OTHER): Payer: Medicare Other | Admitting: Podiatry

## 2020-11-28 ENCOUNTER — Other Ambulatory Visit: Payer: Self-pay

## 2020-11-28 DIAGNOSIS — M79674 Pain in right toe(s): Secondary | ICD-10-CM

## 2020-11-28 DIAGNOSIS — M79675 Pain in left toe(s): Secondary | ICD-10-CM

## 2020-11-28 DIAGNOSIS — D689 Coagulation defect, unspecified: Secondary | ICD-10-CM

## 2020-11-28 DIAGNOSIS — B351 Tinea unguium: Secondary | ICD-10-CM

## 2020-11-28 DIAGNOSIS — N1831 Chronic kidney disease, stage 3a: Secondary | ICD-10-CM

## 2020-11-28 DIAGNOSIS — I739 Peripheral vascular disease, unspecified: Secondary | ICD-10-CM | POA: Diagnosis not present

## 2020-11-28 NOTE — Progress Notes (Signed)
This patient returns to my office for at risk foot care.  This patient requires this care by a professional since this patient will be at risk due to having , PAD and  Chronic kidney disease.  .  This patient is unable to cut nails himself since the patient cannot reach his nails.These nails are painful walking and wearing shoes.  This patient presents for at risk foot care today.  General Appearance  Alert, conversant and in no acute stress.  Vascular  Dorsalis pedis and posterior tibial  pulses are palpable  bilaterally.  Capillary return is within normal limits  bilaterally. Temperature is within normal limits  bilaterally.  Neurologic  Senn-Weinstein monofilament wire test within normal limits  bilaterally. Muscle power within normal limits bilaterally.  Nails Thick disfigured discolored nails with subungual debris  from hallux to fifth toes bilaterally. No evidence of bacterial infection or drainage bilaterally.  Orthopedic  No limitations of motion  feet .  No crepitus or effusions noted.  No bony pathology or digital deformities noted.  Skin  normotropic skin with no porokeratosis noted bilaterally.  No signs of infections or ulcers noted.     Onychomycosis  Pain in right toes  Pain in left toes  Consent was obtained for treatment procedures.   Mechanical debridement of nails 1-5  bilaterally performed with a nail nipper.  Filed with dremel without incident.    Return office visit   3 months                   Told patient to return for periodic foot care and evaluation due to potential at risk complications.   Gardiner Barefoot DPM

## 2020-11-30 NOTE — Progress Notes (Signed)
Cardiology Office Note:    Date:  12/01/2020   ID:  [name], DOB [birth year], MRN [id number]  PCP:  [name], MD  Cardiologist: Tresa Endo Referring MD: [name], MD   No chief complaint on file.   History of Present Illness:    [name] is a 80 y.o. male with a hx of dilated cardiomyopathy (EF as low as 20-25% in 2012), improved to normal ejection fraction (EF 50-55% echo May 2019), history of ventricular tachycardia ablation (July 2013), coronary artery disease with a non-STEMI 2012, nonobstructive CAD by cath (low risk nuclear stress test May 2016, normal perfusion, runs of nonsustained VT, EF 51%), status post implantation of a dual-chamber permanent pacemaker for sinus bradycardia and second-degree AV block Mobitz type I (August 2019, [employer]), asymptomatic paroxysmal atrial fibrillation recorded by pacemaker, presenting with ventricular tachycardia and syncope in December 2020, underwent upgrade to CRT-D device [facility] [place], new [place] 2Q Durata ICD lead, Dr. Ladona Ridgel).  Follow-up echo performed in August 2021 showed unchanged LVEF 40-45%.  He generally feels quite well and continues to take care of his own household independently.  He has not had any more episodes of syncope.  He denies orthopnea, PND, lower extremity edema, orthopnea, PND or angina.  Pacemaker interrogation shows normal device function.  Anticipated generator longevity is 5.3-5.6 years.  He has 92% atrial pacing and 94% ventricular pacing.  He has had chronic but brief episodes of atrial fibrillation, overall burden less than 1%, all of them are less than an hour in duration.  For the most part ventricular rate control is satisfactory at about 90-100 bpm although this limits efficiency of CRT for the duration of the arrhythmia.  The most recent episode of mode switch occurred last November.  He is also had 3 episodes of nonsustained ventricular tachycardia in July, August and November (the  last 1 was 20 beats long), none of them triggering treatment.   Past Medical History:  Diagnosis Date  . CAD (coronary artery disease) 80% stenosis diag of the LAD, 30% in OM2 branch of LCX in 2009    a. Nonobstructive CAD by cath 11/2011 with the exception of the pre-existing diagonal branch #2 lesion.  . Chronic systolic CHF (congestive heart failure) (HCC)   . CKD (chronic kidney disease) stage 3, GFR 30-59 ml/min (HCC)   . Colon polyp, hyperplastic   . History of stress test 06/01/2012   Normal myocardial perfusion study. compared to the previous study there is no significant change. this is a low risk scan  . Hypertension   . Legally blind    "both eyes"  . Myocardial infarction (HCC) 11/22/11  . NICM (nonischemic cardiomyopathy) (HCC)    a. Remote hx of dilated NICM with EF ranging 20-45%, including normal EF by echo (55-60%) in 2014.  Marland Kitchen Peripheral arterial disease (HCC)    a. 06/2014: ABI right 0.99, left 1.2, LE dopplers revealing an occluded right posterior tibial. As symptoms were not felt r/t claudication, no further w/u at the time.  Marland Kitchen PVC's (premature ventricular contractions)   . 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 Requiring discontinuation of BB dose.  Marland Kitchen Spondylolisthesis   . Ventricular bigeminy   . Ventricular tachycardia (paroxysmal) (HCC) 04/11/2015    Past Surgical History:  Procedure Laterality Date  . BACK SURGERY    . BIV UPGRADE N/A 11/08/2019   Procedure: BIV UPGRADE;  Surgeon: Marinus Maw, MD;  Location: [facility] INVASIVE CV  LAB;  Service: Cardiovascular;  Laterality: N/A;  . CARDIAC CATHETERIZATION  11/2011  . CARDIAC CATHETERIZATION  11/2011   didn't demonstrate high grade obstructive disease to account for his LV dysfunction.  Marland Kitchen CATARACT EXTRACTION, BILATERAL  1990's  . CYSTOSCOPY    . CYSTOSCOPY WITH URETHRAL DILATATION N/A 05/04/2013   Procedure: CYSTOSCOPY WITH URETHRAL DILATATION;  Surgeon: Tia Alert, MD;  Location: [facility] NEURO ORS;  Service:  Neurosurgery;  Laterality: N/A;  with insertion of foley catheter  . EP study and ablation of VT  7/13   PVC focus mapped to the right coronary cusp of the aorta, limited ablation performed due to proximity of the focus to the right coronary artery  . EYE SURGERY    . LEFT HEART CATH AND CORONARY ANGIOGRAPHY N/A 11/07/2019   Procedure: LEFT HEART CATH AND CORONARY ANGIOGRAPHY;  Surgeon: Iran Ouch, MD;  Location: [facility] INVASIVE CV LAB;  Service: Cardiovascular;  Laterality: N/A;  . LEFT HEART CATHETERIZATION WITH CORONARY ANGIOGRAM N/A 11/25/2011   Procedure: LEFT HEART CATHETERIZATION WITH CORONARY ANGIOGRAM;  Surgeon: Marykay Lex, MD;  Location: [facility] CATH LAB;  Service: Cardiovascular;  Laterality: N/A;  . PACEMAKER IMPLANT N/A 07/12/2018   Procedure: PACEMAKER IMPLANT;  Surgeon: Thurmon Fair, MD;  Location: [facility] INVASIVE CV LAB;  Service: Cardiovascular;  Laterality: N/A;  . POSTERIOR FUSION LUMBAR SPINE  1979  . ROTATOR CUFF REPAIR  2000's   left  . V-TACH ABLATION N/A 06/06/2012   Procedure: V-TACH ABLATION;  Surgeon: Hillis Range, MD;  Location: [facility] CATH LAB;  Service: Cardiovascular;  Laterality: N/A;    Current Medications: Current Meds  Medication Sig  . albuterol (VENTOLIN HFA) 108 (90 Base) MCG/ACT inhaler Inhale into the lungs.  Marland Kitchen aspirin EC 81 MG tablet Take 1 tablet (81 mg total) by mouth daily. Swallow whole.  Marland Kitchen BREO ELLIPTA 100-25 MCG/INH AEPB 1 puff every morning.  . dorzolamide-timolol (COSOPT) 22.3-6.8 MG/ML ophthalmic solution   . furosemide (LASIX) 20 MG tablet Take by mouth.  Marland Kitchen HYDROcodone-acetaminophen (NORCO/VICODIN) 5-325 MG tablet Take 1 tablet by mouth every 4 (four) hours as needed.  . isosorbide mononitrate (IMDUR) 30 MG 24 hr tablet Take 1 tablet by mouth daily.  Marland Kitchen lidocaine (LIDODERM) 5 % Place 1 patch onto the skin daily. Remove & Discard patch within 12 hours or as directed by MD  . LUMIGAN 0.01 % SOLN Place 1 drop into both eyes at bedtime.   .  metoprolol succinate (TOPROL-XL) 100 MG 24 hr tablet TAKE 1 TABLET(100 MG) BY MOUTH DAILY WITH OR IMMEDIATELY FOLLOWING A MEAL  . nitroGLYCERIN (NITROSTAT) 0.4 MG SL tablet DISSOLVE 1 TABLET UNDER THE TONGUE EVERY 5 MINUTES AS NEEDED FOR CHEST PAIN  . RESTASIS 0.05 % ophthalmic emulsion Place 1 drop into both eyes daily.  Marland Kitchen spironolactone (ALDACTONE) 25 MG tablet TAKE 1/2 TABLET(12.5 MG) BY MOUTH TWICE DAILY  . [DISCONTINUED] cephALEXin (KEFLEX) 500 MG capsule   . [DISCONTINUED] levofloxacin (LEVAQUIN) 750 MG tablet Take 750 mg by mouth every morning.     Allergies:   Patient has no known allergies.   Social History   Socioeconomic History  . Marital status: Legally Separated    Spouse name: Not on file  . Number of children: 3  . Years of education: Not on file  . Highest education level: Not on file  Occupational History  . Occupation: Retired    Associate Professor: RETIRED  Tobacco Use  . Smoking status: Former Smoker    Years: 50.00  Types: Cigarettes    Quit date: 11/23/1999    Years since quitting: 21.0  . Smokeless tobacco: Never Used  Vaping Use  . Vaping Use: Never used  Substance and Sexual Activity  . 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 Drug use: No  . Sexual activity: Yes  Other Topics Concern  . Not on file  Social History Narrative  . Not on file   Social Determinants of Health   Financial Resource Strain: Not on file  Food Insecurity: Not on file  Transportation Needs: Not on file  Physical Activity: Not on file  Stress: Not on file  Social Connections: Not on file     Family History: The patient's family history includes Asthma in his mother; Other in an other family member.  ROS:   Please see the history of present illness.    All other systems are reviewed and are negative.   EKGs/Labs/Other Studies Reviewed:    The following studies were reviewed today: Comprehensive pacemaker check in  the office today  EKG:  EKG is ordered today.  It shows AV sequential pacing with rare PVCs and a distinct positive R wave in lead V1  Recent Labs: 04/19/2020: BUN 20; Creatinine, Ser 1.51; Hemoglobin 14.5; Platelets 153; Potassium 3.9; Sodium 141   11/06/2020 Hemoglobin 13.7, potassium 3.9, normal LFTs Total cholesterol 115, HDL 37, LDL 63, triglycerides 73  11/91/4782 Creatinine 1.1  Recent Lipid Panel    Component Value Date/Time   CHOL 107 04/13/2019 1123   TRIG 85 04/13/2019 1123   HDL 50 04/13/2019 1123   CHOLHDL 2.1 04/13/2019 1123   CHOLHDL 2.2 01/24/2017 0950   VLDL 8 01/24/2017 0950   LDLCALC 40 04/13/2019 1123    Physical Exam:    VS:  BP (!) 157/80   Pulse 73   Ht 5\' 8"  (1.727 m)   Wt 172 lb (78 kg)   SpO2 100%   BMI 26.15 kg/m     Wt Readings from Last 3 Encounters:  12/01/20 172 lb (78 kg)  09/04/20 173 lb 6.4 oz (78.7 kg)  06/20/20 171 lb (77.6 kg)     General: Alert, oriented x3, no distress, well-healed left subclavian pacemaker site Head: no evidence of trauma, PERRL, EOMI, no exophtalmos or lid lag, no myxedema, no xanthelasma; normal ears, nose and oropharynx Neck: normal jugular venous pulsations and no hepatojugular reflux; brisk carotid pulses without delay and no carotid bruits Chest: clear to auscultation, no signs of consolidation by percussion or palpation, normal fremitus, symmetrical and full respiratory excursions Cardiovascular: normal position and quality of the apical impulse, regular rhythm, normal first and paradoxically split second heart sounds, no murmurs, rubs or gallops Abdomen: no tenderness or distention, no masses by palpation, no abnormal pulsatility or arterial bruits, normal bowel sounds, no hepatosplenomegaly Extremities: no clubbing, cyanosis or edema; 2+ radial, ulnar and brachial pulses bilaterally; 2+ right femoral, posterior tibial and dorsalis pedis pulses; 2+ left femoral, posterior tibial and dorsalis pedis pulses;  no subclavian or femoral bruits Neurological: grossly nonfocal Psych: Normal mood and affect   ASSESSMENT:    1. Paroxysmal atrial fibrillation (HCC)   2. Second degree AV block, Mobitz type I   3. Biventricular automatic implantable cardioverter defibrillator in situ   4. SSS (sick sinus syndrome) (HCC)   5. Chronic combined systolic and diastolic heart failure (HCC)   6. Coronary artery disease of native artery of  native heart with stable angina pectoris (HCC)   7. NSVT (nonsustained ventricular tachycardia) (HCC)   8. Essential hypertension   9. Stage 3a chronic kidney disease (HCC)   10. Atherosclerosis of aorta (HCC)   11. Hyperlipidemia with target LDL less than 70    PLAN:    In order of problems listed above:  1. Paroxysmal atrial fibrillation: The arrhythmia is frequent, but always brief, overall burden less than 1%.  I am not sure this will have a huge negative impact on his CRT, at least not to the point that it imposes introduction of antiarrhythmics.. CHADSVasc 5 (age 45, HTN, CHF, CAD).   2. 2nd deg AVB: 94 % biventricular pacing. 3. CRT-D: Normal device function.  Continue remote downloads every 3 months. 4. SSS: Has almost 100% atrial pacing as well, with good heart rate histogram distribution. 5. CHF: LVEF 40-45%, no clinically evident heart failure.  On beta-blockers and spironolactone, not on Entresto.  Discuss with Dr. Tresa Endo. 6. CAD: He does not have angina on beta-blocker/long-acting nitrate therapy. 7. NSVT: Syncope related to VT last December.  Had a VT ablation in 2013.  3 episodes of nonsustained VT detected in last 6 months, but none of them leading to device intervention. 8. HTN: Usually much better controlled.  His blood pressure today is uncharacteristically high for him.  His systolic blood pressure is typically in the 120s (124 at his visit in October).  Discussed whether or not Sherryll Burger is a good choice for him. 9. CKD 3: Renal function is substantially  improved, creatinine was 1.1 in July 10. Ao atherosclerosis: noted on October 2021 CT. 11. HLP: LDL within target range of 63, but I am surprised that his current medicine list does not include a statin.  I wonder whether this was stopped inadvertently whether this is just an error.  Asked him to check his medicines at home.   Medication Adjustments/Labs and Tests Ordered: Current medicines are reviewed at length with the patient today.  Concerns regarding medicines are outlined above.  Orders Placed This Encounter  Procedures  . EKG 12-Lead   No orders of the defined types were placed in this encounter.   Patient Instructions  Medication Instructions:  No changes *If you need a refill on your cardiac medications before your next appointment, please call your pharmacy*   Lab Work: None ordered If you have labs (blood work) drawn today and your tests are completely normal, you will receive your results only by: Marland Kitchen MyChart Message (if you have MyChart) OR . A paper copy in the mail If you have any lab test that is abnormal or we need to change your treatment, we will call you to review the results.   Testing/Procedures: None ordered   Follow-Up: At [facility], you and your health needs are our priority.  As part of our continuing mission to provide you with exceptional heart care, we have created designated Provider Care Teams.  These Care Teams include your primary Cardiologist (physician) and Advanced Practice Providers (APPs -  Physician Assistants and Nurse Practitioners) who all work together to provide you with the care you need, when you need it.  We recommend signing up for the patient portal called "MyChart".  Sign up information is provided on this After Visit Summary.  MyChart is used to connect with patients for Virtual Visits (Telemedicine).  Patients are able to view lab/test results, encounter notes, upcoming appointments, etc.  Non-urgent messages can be sent to  your provider  as well.   To learn more about what you can do with MyChart, go to [email or web address].    Your next appointment:   12 month(s)  The format for your next appointment:   In Person  Provider:   Thurmon Fair, MD      Signed, Thurmon Fair, MD  12/01/2020 7:38 PM    [facility]

## 2020-12-01 ENCOUNTER — Other Ambulatory Visit: Payer: Self-pay

## 2020-12-01 ENCOUNTER — Ambulatory Visit (INDEPENDENT_AMBULATORY_CARE_PROVIDER_SITE_OTHER): Payer: Medicare Other | Admitting: Cardiovascular Disease

## 2020-12-01 ENCOUNTER — Encounter: Payer: Self-pay | Admitting: Cardiovascular Disease

## 2020-12-01 VITALS — BP 157/80 | HR 73 | Ht 68.0 in | Wt 172.0 lb

## 2020-12-01 DIAGNOSIS — I495 Sick sinus syndrome: Secondary | ICD-10-CM

## 2020-12-01 DIAGNOSIS — I10 Essential (primary) hypertension: Secondary | ICD-10-CM | POA: Diagnosis not present

## 2020-12-01 DIAGNOSIS — I441 Atrioventricular block, second degree: Secondary | ICD-10-CM

## 2020-12-01 DIAGNOSIS — I5042 Chronic combined systolic (congestive) and diastolic (congestive) heart failure: Secondary | ICD-10-CM

## 2020-12-01 DIAGNOSIS — I480 Paroxysmal atrial fibrillation: Secondary | ICD-10-CM

## 2020-12-01 DIAGNOSIS — I4729 Other ventricular tachycardia: Secondary | ICD-10-CM

## 2020-12-01 DIAGNOSIS — I25118 Atherosclerotic heart disease of native coronary artery with other forms of angina pectoris: Secondary | ICD-10-CM | POA: Diagnosis not present

## 2020-12-01 DIAGNOSIS — Z950 Presence of cardiac pacemaker: Secondary | ICD-10-CM

## 2020-12-01 DIAGNOSIS — E785 Hyperlipidemia, unspecified: Secondary | ICD-10-CM | POA: Diagnosis not present

## 2020-12-01 DIAGNOSIS — N1831 Chronic kidney disease, stage 3a: Secondary | ICD-10-CM | POA: Diagnosis not present

## 2020-12-01 DIAGNOSIS — I472 Ventricular tachycardia: Secondary | ICD-10-CM

## 2020-12-01 DIAGNOSIS — Z95810 Presence of automatic (implantable) cardiac defibrillator: Secondary | ICD-10-CM | POA: Diagnosis not present

## 2020-12-01 DIAGNOSIS — I700 Atherosclerosis of aorta: Secondary | ICD-10-CM

## 2020-12-01 NOTE — Patient Instructions (Signed)

## 2020-12-29 ENCOUNTER — Other Ambulatory Visit: Payer: Self-pay | Admitting: Cardiovascular Disease

## 2021-01-12 ENCOUNTER — Other Ambulatory Visit: Payer: Self-pay

## 2021-01-12 ENCOUNTER — Ambulatory Visit (INDEPENDENT_AMBULATORY_CARE_PROVIDER_SITE_OTHER): Payer: Medicare Other | Admitting: Cardiovascular Disease

## 2021-01-12 ENCOUNTER — Encounter: Payer: Self-pay | Admitting: Cardiovascular Disease

## 2021-01-12 DIAGNOSIS — I480 Paroxysmal atrial fibrillation: Secondary | ICD-10-CM | POA: Diagnosis not present

## 2021-01-12 DIAGNOSIS — Z950 Presence of cardiac pacemaker: Secondary | ICD-10-CM

## 2021-01-12 DIAGNOSIS — I25118 Atherosclerotic heart disease of native coronary artery with other forms of angina pectoris: Secondary | ICD-10-CM

## 2021-01-12 DIAGNOSIS — N1831 Chronic kidney disease, stage 3a: Secondary | ICD-10-CM

## 2021-01-12 DIAGNOSIS — I5042 Chronic combined systolic (congestive) and diastolic (congestive) heart failure: Secondary | ICD-10-CM

## 2021-01-12 DIAGNOSIS — I472 Ventricular tachycardia: Secondary | ICD-10-CM

## 2021-01-12 DIAGNOSIS — I4729 Other ventricular tachycardia: Secondary | ICD-10-CM

## 2021-01-12 NOTE — Progress Notes (Signed)
Cardiology Office Note    Date:  01/14/2021   ID:  [name], DOB September 20, 1941, MRN [id number]  PCP:  Lucianne Lei, MD  Cardiologist:  Shelva Majestic, MD   No chief complaint on file.  7 month F/U  History of Present Illness:  [name] is a 80 y.o. male  whohas a history of a dilated cardiomyopathy with remote ejection fractions in the 35-45% range. In December 2012 he presented with acute congestive heart failure requiring BiPAP therapy and during his hospitalization ejection fraction dropped to 20-25%. He ruled in for non-ST segment elevation MI and was not found to have high-grade obstructive CAD. He did wear life-vest transiently and with improvement of his ejection fraction to approximately 35-40% in April 2013 his life as was discontinued. He also has a history of intermittent second-degree AV block lead leading to discontinuance of his beta blocker therapy and reduction of his amiodarone dose. An echo Doppler study in March 2014 revealed an ejection fraction that had increased to approximately 55%. 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  He was hospitalized in May 2016 with chest pain. His cardiac enzymes were negative for MI. He had an exercise Myoview study on 04/10/2015 and on the stress portion of the study he had episodes of nonsustained VT. He did not have chest pain or diagnostic ECG changes and the result was felt to be low risk. During that hospitalization a follow-up echo Doppler study showed an ejection fraction that had improved to 45-50% with inferior hypokinesis. There was mild LVH and grade 1 diastolic dysfunction. During his hospitalization he did have some bradycardia.  He was evaluated in th ER on 01/01/17 for palpitaions and was felt to have an URI. He was febrile to 100.6. Laboratory showed a Cr 1.74 which had increased from 1.37 10 months ago. He denies any chest pain. He denies PND or orthopnea. He is unaware of any recurrent palpitations. He  denies significant edema.   He has noticed very rare twinges of atypical chest pain which is short-lived and nonexertional and resolve spontaneously. He has been taking spironolactone 12.5 mg daily, furosemide 20 mg every other day, amlodipine 10 mg in addition to isosorbide 30 mg. He denies any anginal type symptomatology. His has not had any significant edema on this regimen. Laboratory showed improvement in his creatinine at 1.37 from 1.74 in February 2018. Lipid studies were excellent, not on therapy with a total cholesterol 111, triglycerides 42, HDL 50, and LDL 53. He sees Dr. Criss Rosales at three-month intervals.   He was evaluated in the emergency room in December 2018. He had some chest discomfort. There were no acute findings. Chest x-ray was negative. Troponins were negative 2. Subsequently, he has felt well.   I last saw him on Apr 07, 2018. At that time, he remained asymptomatic with stable blood pressure without chest pain on amlodipine 10 mg, furosemide 20 g, isosorbide 30 mg daily in addition to spironolactone 12.5 mg daily. His ECG at that time showed sinus rhythm with PVCs in a bigeminal rhythm for which he was asymptomatic.  He underwent an echo Doppler study on Apr 13, 2018 which showed normal LV function without wall motion abnormality, grade 2 diastolic dysfunction, mild biatrial enlargement, and mild pulmonary hypertension with a peak PA pressure 39 mm.  Laboratory in May 2019 had shown normal TSH of 1.27. He has chronic kidney disease stage III with creatinine 1.6. Magnesium level was normal.   During my  evaluation in  July 2019 he was asymptomatic with reference to dizziness or lightheadedness but his heart rate was in the 30s and he appeared to have second-degree block intermittent junctional rhythm and I suspected significant sick sinus syndrome.  I referred him to Dr. Royann Shivers for further evaluation.  He was admitted on July 12, 2018 and underwent  insertion of a new dual-chamber permanent pacemaker for symptomatic bradycardia,2nd degree AV block, and sinus node dysfunction by Dr. Royann Shivers. Subsequently, when last seen by Dr. Jomarie Longs he had developed 2 very brief episodes of PAT and had a  short episode of PAF.  I evaluated him in a telemedicine visit in May 2020.  At that time he remained stable with reference to his nonischemic cardiomyopathy.  However blood pressure was elevated and I further titrated Toprol-XL to 37.5 mg. He denied any chest pain or shortness of breath.  There was no leg swelling.  He has not been able to walk with the COVID-19 pandemic.  At times he notes a rare palpitation. Of note, he was found to have an episode of PAF on remote monitoring. Dr. Royann Shivers had discussed this with him and with the patient's desire not to start anticoagulation therapy the decision was made not to institute anticoagulation with his burden less than 1%.  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 and age of 56.  He saw Dr. Royann Shivers for pacemaker follow-up on October 22, 2019.  He was feeling well and only had mild shortness of breath if he try to run or walk very fast.  Pacemaker interrogation showed sinus bradycardia with second-degree AV block with Mobitz type I with ventricular rate in the high 30s to low 40s as his underlying rhythm.  He had 89% atrial pacing and 95% ventricular pacing.  Atrial fibrillation burden remains very low at less than 1% but he had several episodes that were prolonged episodes of asymptomatic PAF longest lasting >3 hours.  I  saw him on October 25, 2019 at which time he felt well and denied any chest pain, PND orthopnea.  He denied any presyncope or syncope.    He was  hospitalized from December 15 through November 09, 2019 after presenting with sudden loss of consciousness without any prodrome of chest pain prior to the episode.  He was felt to have syncope with VT.  During that hospitalization he underwent repeat cardiac catheterization on November 07, 2019 by Dr. Kirke Corin which showed stable 80% stenosis in the diagonal branch, unchanged from 2009.  Remotely had undergone a prior pacemaker insertion and had undergone ablation for PVCs.  During his hospitalization in December, he underwent a BiV ICD upgrade and had a subtotally occluded SCV was traversed with a Glidewire by Dr. Sharrell Ku.  He saw Dr. Ladona Ridgel in follow-up on February 20, 2020 and he remained asymptomatic.  His chronic systolic heart failure was well controlled and he was euvolemic with class II symptoms.  He was maintaining sinus rhythm without recurrent PAF, no longer on amiodarone.  I last saw him on June 20, 2020. He had sustained a left fibula metatarsal fracture earlier this year.  Apparently the patient stopped taking Eliquis in November 2020.  He denied chest pain.  At times there is some very minimal shortness of  breath.  He was unaware of palpitations.   Since I last saw him, underwent an echo Doppler study in August 2021 which showed an EF of 40 to 45% and grade 1 diastolic dysfunction. There was severe akinesis of the inferior wall.  He has seen Dr. Lamonte Sakai who is following him for a lung nodule.  He saw Dr. Sallyanne Kuster in January 2022 and  pacemaker interrogation normal device function.  Presently, he denies any significant chest pain.  He notes a rare nonexertional occurrence.  At times he also admits to some shortness of breath.  He had laboratory in December 2021 which showed total cholesterol 115 LDL cholesterol 63 triglycerides 73 and HDL 37.  Creatinine was 1.38.  Past Medical History:  Diagnosis Date  . CAD (coronary artery disease) 80% stenosis diag of the LAD, 30% in OM2 branch of LCX in 2009     a. Nonobstructive CAD by cath 11/2011 with the exception of the pre-existing diagonal branch #2 lesion.  . Chronic systolic CHF (congestive heart failure) ([place])   . CKD (chronic kidney disease) stage 3, GFR 30-59 ml/min (HCC)   . Colon polyp, hyperplastic   . History of stress test 06/01/2012   Normal myocardial perfusion study. compared to the previous study there is no significant change. this is a low risk scan  . Hypertension   . Legally blind    "both eyes"  . Myocardial infarction ([place]) 11/22/11  . NICM (nonischemic cardiomyopathy) ([place])    a. Remote hx of dilated NICM with EF ranging 20-45%, including normal EF by echo (55-60%) in 2014.  Marland Kitchen Peripheral arterial disease ([place])    a. 06/2014: ABI right 0.99, left 1.2, LE dopplers revealing an occluded right posterior tibial. As symptoms were not felt r/t claudication, no further w/u at the time.  Marland Kitchen PVC's (premature ventricular contractions)   . 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 Requiring discontinuation of BB dose.  Marland Kitchen Spondylolisthesis   . Ventricular bigeminy   . Ventricular tachycardia (paroxysmal) ([place]) 04/11/2015    Past Surgical History:  Procedure Laterality Date  . BACK SURGERY    . BIV UPGRADE N/A 11/08/2019   Procedure: BIV UPGRADE;  Surgeon: Evans Lance, MD;  Location: [facility];  Service: Cardiovascular;  Laterality: N/A;  . CARDIAC CATHETERIZATION  11/2011  . CARDIAC CATHETERIZATION  11/2011   didn't demonstrate high grade obstructive disease to account for his LV dysfunction.  Marland Kitchen CATARACT EXTRACTION, BILATERAL  1990's  . CYSTOSCOPY    . CYSTOSCOPY WITH URETHRAL DILATATION N/A 05/04/2013   Procedure: CYSTOSCOPY WITH URETHRAL DILATATION;  Surgeon: Eustace Moore, MD;  Location: [facility];  Service: Neurosurgery;  Laterality: N/A;  with insertion of foley catheter  . EP study and ablation of VT  7/13   PVC focus mapped to the right coronary cusp of the aorta, limited ablation performed due to  proximity of the focus to the right coronary artery  . EYE SURGERY    . LEFT HEART CATH AND CORONARY ANGIOGRAPHY N/A 11/07/2019   Procedure: LEFT HEART CATH AND CORONARY ANGIOGRAPHY;  Surgeon: Wellington Hampshire, MD;  Location: [facility];  Service: Cardiovascular;  Laterality: N/A;  . LEFT HEART CATHETERIZATION WITH CORONARY ANGIOGRAM N/A 11/25/2011   Procedure: LEFT HEART CATHETERIZATION WITH CORONARY ANGIOGRAM;  Surgeon: Leonie Man, MD;  Location: [facility];  Service: Cardiovascular;  Laterality: N/A;  . PACEMAKER IMPLANT N/A 07/12/2018   Procedure: PACEMAKER IMPLANT;  Surgeon: Sanda Klein, MD;  Location: [facility];  Service: Cardiovascular;  Laterality: N/A;  . POSTERIOR FUSION [place]  . ROTATOR CUFF REPAIR  2000's   left  . V-TACH ABLATION N/A 06/06/2012   Procedure: V-TACH ABLATION;  Surgeon: Thompson Grayer, MD;  Location: [facility] C[facility] LAB;  Service: Cardiovascular;  Laterality: N/A;    Current Medications: Outpatient Medications Prior to Visit  Medication Sig Dispense Refill  . albuterol (VENTOLIN HFA) 108 (90 Base) MCG/ACT inhaler Inhale into the lungs.    Marland Kitchen aspirin EC 81 MG tablet Take 1 tablet (81 mg total) by mouth daily. Swallow whole. 90 tablet 3  . BREO ELLIPTA 100-25 MCG/INH AEPB 1 puff every morning.    . dorzolamide-timolol (COSOPT) 22.3-6.8 MG/ML ophthalmic solution     . furosemide (LASIX) 20 MG tablet TAKE 1 TABLET(20 MG) BY MOUTH DAILY 30 tablet 11  . HYDROcodone-acetaminophen (NORCO/VICODIN) 5-325 MG tablet Take 1 tablet by mouth every 4 (four) hours as needed. 10 tablet 0  . isosorbide mononitrate (IMDUR) 30 MG 24 hr tablet Take 1 tablet by mouth daily.    Marland Kitchen lidocaine (LIDODERM) 5 % Place 1 patch onto the skin daily. Remove & Discard patch within 12 hours or as directed by MD 5 patch 0  . LUMIGAN 0.01 % SOLN Place 1 drop into both eyes at bedtime.   0  . metoprolol succinate (TOPROL-XL) 100 MG 24 hr tablet TAKE 1 TABLET(100 MG) BY MOUTH  DAILY WITH OR IMMEDIATELY FOLLOWING A MEAL 90 tablet 3  . nitroGLYCERIN (NITROSTAT) 0.4 MG SL tablet DISSOLVE 1 TABLET UNDER THE TONGUE EVERY 5 MINUTES AS NEEDED FOR CHEST PAIN    . RESTASIS 0.05 % ophthalmic emulsion Place 1 drop into both eyes daily.  11  . spironolactone (ALDACTONE) 25 MG tablet TAKE 1/2 TABLET(12.5 MG) BY MOUTH TWICE DAILY 30 tablet 6   No facility-administered medications prior to visit.     Allergies:   Patient has no known allergies.   Social History   Socioeconomic History  . Marital status: Legally Separated    Spouse name: Not on file  . Number of children: 3  . Years of education: Not on file  . Highest education level: Not on file  Occupational History  . Occupation: Retired    Fish farm manager: RETIRED  Tobacco Use  . Smoking status: Former Smoker    Years: 50.00    Types: Cigarettes    Quit date: 11/23/1999    Years since quitting: 21.1  . Smokeless tobacco: Never Used  Vaping Use  . Vaping Use: Never used  Substance and Sexual Activity  . 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 Drug use: No  . Sexual activity: Yes  Other Topics Concern  . Not on file  Social History Narrative  . Not on file   Social Determinants of Health   Financial Resource Strain: Not on file  Food Insecurity: Not on file  Transportation Needs: Not on file  Physical Activity: Not on file  Stress: Not on file  Social Connections: Not on file    Socially, he is divorced and has 4 children, and 7 grandchildren.  Family History:  The patient's family history includes Asthma in his mother; Other in an other family member.   ROS General: Negative; No fevers, chills, or night sweats;  HEENT: Negative; No changes in vision or hearing, sinus congestion, difficulty swallowing  Pulmonary: Lung nodule, COPD Cardiovascular:  See HPI GI: Negative; No nausea, vomiting, diarrhea, or abdominal pain GU: Negative; No dysuria,  hematuria, or difficulty voiding Musculoskeletal: Negative; no myalgias, joint pain, or weakness Hematologic/Oncology: Negative; no easy bruising, bleeding Endocrine: Negative; no heat/cold intolerance; no diabetes Neuro: Negative; no changes in balance, headaches Skin: Negative; No rashes or skin lesions Psychiatric: Negative; No behavioral problems, depression Sleep: Negative; No snoring, daytime sleepiness, hypersomnolence, bruxism, restless legs, hypnogognic hallucinations, no cataplexy Other comprehensive 14 point system review is negative.   PHYSICAL EXAM:   VS:  BP 122/76   Pulse 71   Ht _0  (1.727 m)   Wt 170 lb 12.8 oz (77.5 kg)   SpO2 99%   BMI 25.97 kg/m     Repeat blood pressure 114/70  Wt Readings from Last 3 Encounters:  01/12/21 170 lb 12.8 oz (77.5 kg)  12/01/20 172 lb (78 kg)  09/04/20 173 lb 6.4 oz (78.7 kg)    General: Alert, oriented, no distress.  Skin: normal turgor, no rashes, warm and dry HEENT: Normocephalic, atraumatic. Pupils equal round and reactive to light; sclera anicteric; extraocular muscles intact;  Nose without nasal septal hypertrophy Mouth/Parynx benign; Mallinpatti scale 3 Neck: No JVD, no carotid bruits; normal carotid upstroke Lungs: clear to ausculatation and percussion; no wheezing or rales Chest wall: without tenderness to palpitation Heart: PMI not displaced, RRR, s1 s2 normal, 1/6 systolic murmur, no diastolic murmur, no rubs, gallops, thrills, or heaves Abdomen: soft, nontender; no hepatosplenomehaly, BS+; abdominal aorta nontender and not dilated by palpation. Back: no CVA tenderness Pulses 2+ Musculoskeletal: full range of motion, normal strength, no joint deformities Extremities: no clubbing cyanosis or edema, Homan's sign negative  Neurologic: grossly nonfocal; Cranial nerves grossly wnl Psychologic: Normal mood and affect   Studies/Labs Reviewed:   EKG:  EKG is ordered today. ECG (independently read by me): AV paced  rhythm at 71;   July 2021 ECG (independently read by me): AV paced rhythm with occasional V paced complexes, PVC.  PR 180 ms.  December 01/2019 ECG (independently read by me): AV paced rhythm at 70 bpm with 100% capture.  PR interval 184 ms, QTc interval 486 ms.  Recent Labs: BMP Latest Ref Rng & Units 04/19/2020 12/19/2019 11/09/2019  Glucose 70 - 99 mg/dL 113(H) 97 107(H)  BUN 8 - 23 mg/dL _1 Creatinine 0.61 - 1.24 mg/dL 1.51(H) 1.80(H) 1.46(H)  BUN/Creat Ratio 10 - 24 - - -  Sodium 135 - 145 mmol/L 141 139 138  Potassium 3.5 - 5.1 mmol/L 3.9 4.2 4.0  Chloride 98 - 111 mmol/L 108 108 108  CO2 22 - 32 mmol/L 26 23 21(L)  Calcium 8.9 - 10.3 mg/dL 9.5 8.6(L) 8.7(L)     Hepatic Function Latest Ref Rng & Units 11/08/2019 04/13/2019 04/07/2018  Total Protein 6.5 - 8.1 g/dL 6.9 7.1 7.1  Albumin 3.5 - 5.0 g/dL 3.3(L) 4.0 4.1  AST 15 - 41 U/L _2 ALT 0 - 44 U/L _3 Alk Phosphatase 38 - 126 U/L 54 76 73  Total Bilirubin 0.3 - 1.2 mg/dL 1.3(H) 0.4 0.6  Bilirubin, Direct 0.0 - 0.3 mg/dL - - -    CBC Latest Ref Rng & Units 04/19/2020 12/19/2019 11/09/2019  WBC 4.0 - 10.5 K/uL 5.3 3.0(L) 5.8  Hemoglobin 13.0 - 17.0 g/dL 14.5 14.2 14.1  Hematocrit 39.0 - 52.0 % 44.7 44.5 41.4  Platelets 150 - 400 K/uL 153 77(L) 122(L)  Lab Results  Component Value Date   MCV 92.0 04/19/2020   MCV 94.9 12/19/2019   MCV 90.2 11/09/2019   Lab Results  Component Value Date   TSH 1.520 04/13/2019   Lab Results  Component Value Date   HGBA1C 5.9 (H) 05/26/2012     BNP    Component Value Date/Time   BNP 49.9 04/09/2015 1217    ProBNP    Component Value Date/Time   PROBNP 155.4 (H) 03/19/2013 1749     Lipid Panel     Component Value Date/Time   CHOL 107 04/13/2019 1123   TRIG 85 04/13/2019 1123   HDL 50 04/13/2019 1123   CHOLHDL 2.1 04/13/2019 1123   CHOLHDL 2.2 01/24/2017 0950   VLDL 8 01/24/2017 0950   LDLCALC 40 04/13/2019 1123   LABVLDL 17 04/13/2019 1123      RADIOLOGY: No results found.   Additional studies/ records that were reviewed today include:  Study Conclusions  - Left ventricle: The cavity size was normal. Wall thickness was   normal. Systolic function was normal. The estimated ejection   fraction was in the range of 50% to 55%. Wall motion was normal;   there were no regional wall motion abnormalities. Features are   consistent with a pseudonormal left ventricular filling pattern,   with concomitant abnormal relaxation and increased filling   pressure (grade 2 diastolic dysfunction). - Left atrium: The atrium was mildly dilated. - Right ventricle: The cavity size was mildly dilated. - Right atrium: The atrium was mildly dilated. - Pulmonary arteries: Systolic pressure was mildly increased. PA   peak pressure: 39 mm Hg (S).  Impressions:  - Normal LV systolic function; mild diastolic dysfunction; mild   LAE; mild RAE/RVE; mild TR with mild pulmonary hypertension.  ------------------------------------------------------------------- Labs, prior tests, procedures, and surgery: Transthoracic echocardiography (04/10/2015).     EF was 45%.   ASSESSMENT:    1. Coronary artery disease of native artery of native heart with stable angina pectoris ([place])   2. Chronic combined systolic and diastolic heart failure (HCC)   3. Paroxysmal atrial fibrillation (HCC)   4. Stage 3a chronic kidney disease ([place])   5. NSVT (nonsustained ventricular tachycardia) (HCC)   6. Pacemaker     PLAN:  Mr Gurney is an 80 year old gentleman who has a history of mild CAD, heart failure with reduced EF in December 2012 requiring BiPAP therapy with EF at 20 to 25%.  He had worn a LifeVest transiently and EF ultimately improved to 35 to 40% in 2013 and LifeVest was discontinued.  He has had prior pacemaker insertion and also ablation for PVCs.  He has a history of paroxysmal atrial fibrillation for which she had been on Eliquis.  Apparently he had  been off Eliquis since November 2020.  He developed an episode of VT leading to syncope in December and ultimately underwent repeat catheterization which essentially was unchanged and showed stable 80% stenosis in a diagonal branch of his LAD with mild 30% narrowing in the LAD.  There was minimal plaque in the circumflex and RCA.  He underwent BiV ICD upgrade by Dr. Lovena Le subsequently has been doing well without recurrent VT or atrial fibrillation.  Mr. Spofford continues to be stable and denies any recurrent anginal symptomatology.  His blood pressure today is excellent on his regimen consisting of Adderall lactone 12.5 mg twice a day, metoprolol succinate 100 mg, isosorbide 30 mg, and furosemide 20 mg daily.  I last saw him, I reduced his  aspirin to 81 mg.  I reviewed his most recent echo Doppler study which showed an EF of 40 to 31% grade 1 diastolic dysfunction.  There was mild aortic sclerosis without stenosis.  Severe akinesis of the inferior wall.  He has stage III CKD with creatinine 1.38.  At present he is euvolemicand not on any ARB, but of renal function is stable a trial of Entresto may be beneficial. I reviewed records from Dr. Collene Gobble recent CT of his chest from December 19, 2019 which showed areas of atelectasis and/or infiltrate bilaterally in the upper lobes and in the posterior aspect of the lower lobes.  Lung nodules were demonstrated.  CT showed emphysema as well as aortic atherosclerosis.  I reviewed recent laboratory.  Lipid studies are excellent with LDL cholesterol at sixty-three and total cholesterol 115.  I will see him in 6 months for reevaluation or sooner as needed peer   Medication Adjustments/Labs and Tests Ordered: Current medicines are reviewed at length with the patient today.  Concerns regarding medicines are outlined above.  Medication changes, Labs and Tests ordered today are listed in the Patient Instructions below. Patient Instructions  Medication Instructions:   Your physician recommends that you continue on your current medications as directed. Please refer to the Current Medication list given to you today.  *If you need a refill on your cardiac medications before your next appointment, please call your pharmacy*  Lab Work: NONE  Testing/Procedures: NONE  Follow-Up: At [facility], you and your health needs are our priority.  As part of our continuing mission to provide you with exceptional heart care, we have created designated Provider Care Teams.  These Care Teams include your primary Cardiologist (physician) and Advanced Practice Providers (APPs -  Physician Assistants and Nurse Practitioners) who all work together to provide you with the care you need, when you need it.  We recommend signing up for the patient portal called "MyChart".  Sign up information is provided on this After Visit Summary.  MyChart is used to connect with patients for Virtual Visits (Telemedicine).  Patients are able to view lab/test results, encounter notes, upcoming appointments, etc.  Non-urgent messages can be sent to your provider as well.   To learn more about what you can do with MyChart, go to [email or web address].    Your next appointment:   6 month(s)  The format for your next appointment:   In Person  Provider:   You may see Shelva Majestic, MD or one of the following Advanced Practice Providers on your designated Care Team:    Almyra Deforest, PA-C  Fabian Sharp, PA-C or   Roby Lofts, [place]       Signed, Shelva Majestic, MD  01/14/2021 6:28 PM    [facility] [address] [place], [place], [place]  [address] Phone: [phone]

## 2021-01-12 NOTE — Patient Instructions (Signed)
Medication Instructions:  Your physician recommends that you continue on your current medications as directed. Please refer to the Current Medication list given to you today.  *If you need a refill on your cardiac medications before your next appointment, please call your pharmacy*  Lab Work: NONE  Testing/Procedures: NONE  Follow-Up: At [facility], you and your health needs are our priority.  As part of our continuing mission to provide you with exceptional heart care, we have created designated Provider Care Teams.  These Care Teams include your primary Cardiologist (physician) and Advanced Practice Providers (APPs -  Physician Assistants and Nurse Practitioners) who all work together to provide you with the care you need, when you need it.  We recommend signing up for the patient portal called "MyChart".  Sign up information is provided on this After Visit Summary.  MyChart is used to connect with patients for Virtual Visits (Telemedicine).  Patients are able to view lab/test results, encounter notes, upcoming appointments, etc.  Non-urgent messages can be sent to your provider as well.   To learn more about what you can do with MyChart, go to [email or web address].    Your next appointment:   6 month(s)  The format for your next appointment:   In Person  Provider:   You may see Shelva Majestic, MD or one of the following Advanced Practice Providers on your designated Care Team:    Almyra Deforest, PA-C  Fabian Sharp, PA-C or   Roby Lofts, [place]

## 2021-01-14 ENCOUNTER — Encounter: Payer: Self-pay | Admitting: Cardiovascular Disease

## 2021-02-05 DIAGNOSIS — H42 Glaucoma in diseases classified elsewhere: Secondary | ICD-10-CM | POA: Diagnosis not present

## 2021-02-05 DIAGNOSIS — I10 Essential (primary) hypertension: Secondary | ICD-10-CM | POA: Diagnosis not present

## 2021-02-05 DIAGNOSIS — E1169 Type 2 diabetes mellitus with other specified complication: Secondary | ICD-10-CM | POA: Diagnosis not present

## 2021-02-05 DIAGNOSIS — I5032 Chronic diastolic (congestive) heart failure: Secondary | ICD-10-CM | POA: Diagnosis not present

## 2021-02-19 ENCOUNTER — Other Ambulatory Visit: Payer: Self-pay | Admitting: *Deleted

## 2021-02-19 NOTE — Patient Outreach (Signed)
[facility]) Care Management  02/19/2021  [name] 02-05-1941 [id number]   Referral Date: 3/22 Referral Source: Insurance Referral Reason: High Risk Patient (CVD, CHF, COPD, HTN) Insurance: [facility]   Outreach attempt #1, unsuccessful, unable to leave a voice message.  Plan: RN CM will send outreach letter and follow up within the next 3-4 business days.  [name], [place], MSN [place] [phone]

## 2021-02-24 ENCOUNTER — Encounter: Payer: Self-pay | Admitting: *Deleted

## 2021-02-24 ENCOUNTER — Other Ambulatory Visit: Payer: Self-pay | Admitting: *Deleted

## 2021-02-24 NOTE — Patient Outreach (Signed)
[facility]) Care Management  [facility] Care Manager  02/24/2021   [name] 1941-02-12 [id number]   Referral Date: 3/22 Referral Source: Insurance Referral Reason: High Risk Patient (CVD, CHF, COPD, HTN) Insurance: [facility]   Outreach attempt #2, successful.  Identity verified.  This care manager introduced self and stated purpose of call.  [facility] care management services explained.    Social:  Lives with girlfriend, state he is independent in all ADL/IADL's.  Uses a cane for mobility, denies any falls.  Report he is able to cook, follows a low salt diet.  Conditions: Per chart, has history of HTN, CAD, CHF, A-fib, COPD, GERD, CKD, and HLD.  He monitors blood pressure and weight 3 days a week, uses the [facility] telemonitoring system.    Medications: Reviewed with member and significant other.  She helps with medication management.  Appointments:  Report he was seen by PCP on 3/17, will follow up in about 4 months.  State she manages his CHF.    Encounter Medications:  Outpatient Encounter Medications as of 02/24/2021  Medication Sig Note  . albuterol (VENTOLIN HFA) 108 (90 Base) MCG/ACT inhaler Inhale into the lungs.   Marland Kitchen aspirin EC 81 MG tablet Take 1 tablet (81 mg total) by mouth daily. Swallow whole.   Marland Kitchen BREO ELLIPTA 100-25 MCG/INH AEPB 1 puff every morning.   . dorzolamide-timolol (COSOPT) 22.3-6.8 MG/ML ophthalmic solution    . furosemide (LASIX) 20 MG tablet TAKE 1 TABLET(20 MG) BY MOUTH DAILY   . HYDROcodone-acetaminophen (NORCO/VICODIN) 5-325 MG tablet Take 1 tablet by mouth every 4 (four) hours as needed.   . isosorbide mononitrate (IMDUR) 30 MG 24 hr tablet Take 1 tablet by mouth daily.   Marland Kitchen lidocaine (LIDODERM) 5 % Place 1 patch onto the skin daily. Remove & Discard patch within 12 hours or as directed by MD   . LUMIGAN 0.01 % SOLN Place 1 drop into both eyes at bedtime.  04/09/2015: .   . metoprolol succinate (TOPROL-XL) 100 MG 24 hr tablet TAKE 1 TABLET(100 MG) BY  MOUTH DAILY WITH OR IMMEDIATELY FOLLOWING A MEAL   . nitroGLYCERIN (NITROSTAT) 0.4 MG SL tablet DISSOLVE 1 TABLET UNDER THE TONGUE EVERY 5 MINUTES AS NEEDED FOR CHEST PAIN   . RESTASIS 0.05 % ophthalmic emulsion Place 1 drop into both eyes daily. 04/09/2015: .   . spironolactone (ALDACTONE) 25 MG tablet TAKE 1/2 TABLET(12.5 MG) BY MOUTH TWICE DAILY    No facility-administered encounter medications on file as of 02/24/2021.    Functional Status:  No flowsheet data found.  Fall/Depression Screening: No flowsheet data found. No flowsheet data found.  Assessment:  Goals Addressed   None     Plan:  Follow-up:  Patient agrees to Care Plan and Follow-up.  Will inform PCP of [facility] involvement.  Will send HF education and follow up with member within the next 2 weeks.  [name], [place], MSN [place] [phone]

## 2021-02-27 ENCOUNTER — Ambulatory Visit (INDEPENDENT_AMBULATORY_CARE_PROVIDER_SITE_OTHER): Payer: Medicaid Other | Admitting: Podiatry

## 2021-02-27 ENCOUNTER — Encounter: Payer: Self-pay | Admitting: Podiatry

## 2021-02-27 ENCOUNTER — Other Ambulatory Visit: Payer: Self-pay

## 2021-02-27 DIAGNOSIS — D689 Coagulation defect, unspecified: Secondary | ICD-10-CM | POA: Diagnosis not present

## 2021-02-27 DIAGNOSIS — N1831 Chronic kidney disease, stage 3a: Secondary | ICD-10-CM

## 2021-02-27 DIAGNOSIS — M79675 Pain in left toe(s): Secondary | ICD-10-CM

## 2021-02-27 DIAGNOSIS — B351 Tinea unguium: Secondary | ICD-10-CM

## 2021-02-27 DIAGNOSIS — I739 Peripheral vascular disease, unspecified: Secondary | ICD-10-CM

## 2021-02-27 DIAGNOSIS — M79674 Pain in right toe(s): Secondary | ICD-10-CM

## 2021-02-27 NOTE — Progress Notes (Signed)
This patient returns to my office for at risk foot care.  This patient requires this care by a professional since this patient will be at risk due to having , PAD and  Chronic kidney disease.  .  This patient is unable to cut nails himself since the patient cannot reach his nails.These nails are painful walking and wearing shoes.  This patient presents for at risk foot care today.  General Appearance  Alert, conversant and in no acute stress.  Vascular  Dorsalis pedis and posterior tibial  pulses are palpable  bilaterally.  Capillary return is within normal limits  bilaterally. Temperature is within normal limits  bilaterally.  Neurologic  Senn-Weinstein monofilament wire test within normal limits  bilaterally. Muscle power within normal limits bilaterally.  Nails Thick disfigured discolored nails with subungual debris  from hallux to fifth toes bilaterally. No evidence of bacterial infection or drainage bilaterally.  Orthopedic  No limitations of motion  feet .  No crepitus or effusions noted.  No bony pathology or digital deformities noted.  Skin  normotropic skin with no porokeratosis noted bilaterally.  No signs of infections or ulcers noted.     Onychomycosis  Pain in right toes  Pain in left toes  Consent was obtained for treatment procedures.   Mechanical debridement of nails 1-5  bilaterally performed with a nail nipper.  Filed with dremel without incident.    Return office visit   3 months                   Told patient to return for periodic foot care and evaluation due to potential at risk complications.   Gardiner Barefoot DPM

## 2021-03-10 ENCOUNTER — Other Ambulatory Visit: Payer: Self-pay | Admitting: *Deleted

## 2021-03-10 NOTE — Patient Outreach (Signed)
[facility]) Care Management  03/10/2021  [name] [birth year] [id number]   Call placed to member to confirm he has received HF education and low salt diet.  Report he has gotten information and continuing to weigh daily.  Discussed HF zones with member and when to contact MD.  Denies any urgent concerns, encouraged to contact this care manager with questions.  Agrees to follow up within the next month.  [name], [place], MSN [place] [phone]

## 2021-03-31 ENCOUNTER — Emergency Department (HOSPITAL_COMMUNITY)
Admission: EM | Admit: 2021-03-31 | Discharge: 2021-03-31 | Disposition: A | Discharge status: Home / Self Care | Payer: 59 | Attending: Emergency Medicine | Admitting: Emergency Medicine

## 2021-03-31 ENCOUNTER — Emergency Department (HOSPITAL_COMMUNITY): Payer: 59

## 2021-03-31 ENCOUNTER — Other Ambulatory Visit: Payer: Self-pay

## 2021-03-31 ENCOUNTER — Encounter (HOSPITAL_COMMUNITY): Payer: Self-pay

## 2021-03-31 DIAGNOSIS — I130 Hypertensive heart and chronic kidney disease with heart failure and stage 1 through stage 4 chronic kidney disease, or unspecified chronic kidney disease: Secondary | ICD-10-CM | POA: Insufficient documentation

## 2021-03-31 DIAGNOSIS — N183 Chronic kidney disease, stage 3 unspecified: Secondary | ICD-10-CM | POA: Diagnosis not present

## 2021-03-31 DIAGNOSIS — Z950 Presence of cardiac pacemaker: Secondary | ICD-10-CM | POA: Diagnosis not present

## 2021-03-31 DIAGNOSIS — I5042 Chronic combined systolic (congestive) and diastolic (congestive) heart failure: Secondary | ICD-10-CM | POA: Insufficient documentation

## 2021-03-31 DIAGNOSIS — Z87891 Personal history of nicotine dependence: Secondary | ICD-10-CM | POA: Diagnosis not present

## 2021-03-31 DIAGNOSIS — J449 Chronic obstructive pulmonary disease, unspecified: Secondary | ICD-10-CM | POA: Insufficient documentation

## 2021-03-31 DIAGNOSIS — I2510 Atherosclerotic heart disease of native coronary artery without angina pectoris: Secondary | ICD-10-CM | POA: Insufficient documentation

## 2021-03-31 DIAGNOSIS — N390 Urinary tract infection, site not specified: Secondary | ICD-10-CM | POA: Diagnosis not present

## 2021-03-31 DIAGNOSIS — R319 Hematuria, unspecified: Secondary | ICD-10-CM | POA: Diagnosis present

## 2021-03-31 DIAGNOSIS — Z7982 Long term (current) use of aspirin: Secondary | ICD-10-CM | POA: Diagnosis not present

## 2021-03-31 DIAGNOSIS — Z79899 Other long term (current) drug therapy: Secondary | ICD-10-CM | POA: Diagnosis not present

## 2021-03-31 LAB — URINALYSIS, ROUTINE W REFLEX MICROSCOPIC
Bilirubin Urine: NEGATIVE
Glucose, UA: NEGATIVE mg/dL
Ketones, ur: NEGATIVE mg/dL
Nitrite: NEGATIVE
Protein, ur: NEGATIVE mg/dL
RBC / HPF: >50 RBC/hpf — ABNORMAL HIGH (ref 0–5)
Specific Gravity, Urine: 1.006 (ref 1.005–1.030)
pH: 5 (ref 5.0–8.0)

## 2021-03-31 LAB — BASIC METABOLIC PANEL
Anion gap: 7 (ref 5–15)
BUN: 16 mg/dL (ref 8–23)
CO2: 26 mmol/L (ref 22–32)
Calcium: 9.2 mg/dL (ref 8.9–10.3)
Chloride: 107 mmol/L (ref 98–111)
Creatinine, Ser: 1.55 mg/dL — ABNORMAL HIGH (ref 0.61–1.24)
GFR, Estimated: 44 mL/min — ABNORMAL LOW (ref >60)
Glucose, Bld: 101 mg/dL — ABNORMAL HIGH (ref 70–99)
Potassium: 3.6 mmol/L (ref 3.5–5.1)
Sodium: 140 mmol/L (ref 135–145)

## 2021-03-31 LAB — CBC WITH DIFFERENTIAL/PLATELET
Abs Immature Granulocytes: 0.01 10*3/uL (ref 0.00–0.07)
Basophils Absolute: 0 10*3/uL (ref 0.0–0.1)
Basophils Relative: 1 %
Eosinophils Absolute: 0.5 10*3/uL (ref 0.0–0.5)
Eosinophils Relative: 12 %
HCT: 42 % (ref 39.0–52.0)
Hemoglobin: 13.8 g/dL (ref 13.0–17.0)
Immature Granulocytes: 0 %
Lymphocytes Relative: 38 %
Lymphs Abs: 1.7 10*3/uL (ref 0.7–4.0)
MCH: 30.8 pg (ref 26.0–34.0)
MCHC: 32.9 g/dL (ref 30.0–36.0)
MCV: 93.8 fL (ref 80.0–100.0)
Monocytes Absolute: 0.4 10*3/uL (ref 0.1–1.0)
Monocytes Relative: 10 %
Neutro Abs: 1.8 10*3/uL (ref 1.7–7.7)
Neutrophils Relative %: 39 %
Platelets: 129 10*3/uL — ABNORMAL LOW (ref 150–400)
RBC: 4.48 MIL/uL (ref 4.22–5.81)
RDW: 12.3 % (ref 11.5–15.5)
WBC: 4.4 10*3/uL (ref 4.0–10.5)
nRBC: 0 % (ref 0.0–0.2)

## 2021-03-31 MED ORDER — CEPHALEXIN 500 MG PO CAPS: 500.0000 mg | ORAL_CAPSULE | Freq: Four times a day (QID) | ORAL | 0 refills | Status: DC

## 2021-03-31 MED ORDER — CEPHALEXIN 250 MG PO CAPS
500.0000 mg | ORAL_CAPSULE | Freq: Once | ORAL | Status: AC
  Administered 2021-03-31: 500 mg via ORAL
  Filled 2021-03-31: qty 2

## 2021-03-31 NOTE — ED Triage Notes (Signed)
Pt reports waking this am with blood in his urine, states his next bathroom visit he noted his urine to be delayed and thick. Pt has a hx of the same 2 years ago. Pt denies any pain w/urintaion. Hx of kidney stones >20 years. Denies frequent or urgency with urination

## 2021-03-31 NOTE — Discharge Instructions (Addendum)
Take antibiotics as directed to treat for UTI.  If blood in your urine does not completely resolve over the next 2 to 3 days follow-up closely with your primary care doctor or with urology.  Return if you develop abdominal or flank pain, vomiting, fevers or any other new or concerning symptoms.

## 2021-03-31 NOTE — ED Notes (Signed)
PA in to see patient.

## 2021-03-31 NOTE — ED Provider Notes (Signed)
[place] EMERGENCY DEPARTMENT Provider Note   CSN: [id number] Arrival date & time: 03/31/21  1322     History Chief Complaint  Patient presents with  . Hematuria    [name] is a 80 y.o. male.  [name] is a 80 y.o. male with a history of hypertension, CHF, CAD, MI, CKD, PAD, kidney stones, who presents to the emergency department for evaluation of hematuria.  He reports symptoms began this morning.  When he first got up he noticed blood-tinged urine, did not think much of it but then continued to see blood in his urine throughout the morning.  He denies any associated dysuria or urinary frequency.  Denies any abdominal or flank pain.  No testicular pain or swelling.  No associated vomiting or fevers.  He is not currently on any blood thinners.        Past Medical History:  Diagnosis Date  . CAD (coronary artery disease) 80% stenosis diag of the LAD, 30% in OM2 branch of LCX in 2009    a. Nonobstructive CAD by cath 11/2011 with the exception of the pre-existing diagonal branch #2 lesion.  . Chronic systolic CHF (congestive heart failure) ([place])   . CKD (chronic kidney disease) stage 3, GFR 30-59 ml/min (HCC)   . Colon polyp, hyperplastic   . History of stress test 06/01/2012   Normal myocardial perfusion study. compared to the previous study there is no significant change. this is a low risk scan  . Hypertension   . Legally blind    "both eyes"  . Myocardial infarction ([place]) 11/22/11  . NICM (nonischemic cardiomyopathy) ([place])    a. Remote hx of dilated NICM with EF ranging 20-45%, including normal EF by echo (55-60%) in 2014.  Marland Kitchen Peripheral arterial disease ([place])    a. 06/2014: ABI right 0.99, left 1.2, LE dopplers revealing an occluded right posterior tibial. As symptoms were not felt r/t claudication, no further w/u at the time.  Marland Kitchen PVC's (premature ventricular contractions)   . 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 Requiring  discontinuation of BB dose.  Marland Kitchen Spondylolisthesis   . Ventricular bigeminy   . Ventricular tachycardia (paroxysmal) ([place]) 04/11/2015    Patient Active Problem List   Diagnosis Date Noted  . Abnormal CT of the chest 09/04/2020  . COPD (chronic obstructive pulmonary disease) ([place]) 09/04/2020  . ICD (implantable cardioverter-defibrillator) in place   . Cardiac syncope 11/07/2019  . Closed fracture of distal fibula 11/07/2019  . Pain due to onychomycosis of toenails of both feet 05/16/2019  . Coagulation disorder ([place]) 05/16/2019  . Paroxysmal atrial fibrillation ([place]) 08/02/2018  . NSVT (nonsustained ventricular tachycardia) ([place]) 08/02/2018  . Symptomatic bradycardia 07/12/2018  . Pacemaker 07/12/2018  . Tachycardia-bradycardia syndrome ([place])   . Unsteady gait 03/12/2016  . Nausea vomiting and diarrhea 03/11/2016  . Acute lower UTI 03/11/2016  . Gastroenteritis 03/11/2016  . Obstipation 03/11/2016  . Abdominal pain   . Constipation   . Lactic acidosis   . Leg pain   . Essential hypertension 03/04/2016  . Ventricular tachycardia (paroxysmal) ([place]) 04/11/2015  . Chest pain with moderate risk for cardiac etiology 04/09/2015  . CAP (community acquired pneumonia) 02/18/2015  . Chest pain at rest 02/18/2015  . Peripheral arterial disease ([place]) 08/15/2014  . Hyperlipidemia with target LDL less than 70 01/01/2014  . GERD (gastroesophageal reflux disease) 01/01/2014  . First degree AV block 07/31/2012  . Unstable angina, negative MI 06/05/2012  .  Second degree AV block, Mobitz type I 05/27/2012  . CKD (chronic kidney disease) stage 3, GFR 30-59 ml/min (HCC) 05/27/2012  . 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 Chronic diastolic CHF (congestive heart failure) ([place]) 11/23/2011    Class: Diagnosis of  . Coronary artery disease involving native coronary artery of native heart without angina pectoris 11/22/2011   . Premature ventricular contractions 11/22/2011  . Esophageal reflux 07/28/2011    Past Surgical History:  Procedure Laterality Date  . BACK SURGERY    . BIV UPGRADE N/A 11/08/2019   Procedure: BIV UPGRADE;  Surgeon: Evans Lance, MD;  Location: [facility] CV LAB;  Service: Cardiovascular;  Laterality: N/A;  . CARDIAC CATHETERIZATION  11/2011  . CARDIAC CATHETERIZATION  11/2011   didn't demonstrate high grade obstructive disease to account for his LV dysfunction.  Marland Kitchen CATARACT EXTRACTION, BILATERAL  1990's  . CYSTOSCOPY    . CYSTOSCOPY WITH URETHRAL DILATATION N/A 05/04/2013   Procedure: CYSTOSCOPY WITH URETHRAL DILATATION;  Surgeon: Eustace Moore, MD;  Location: [place] NEURO ORS;  Service: Neurosurgery;  Laterality: N/A;  with insertion of foley catheter  . EP study and ablation of VT  7/13   PVC focus mapped to the right coronary cusp of the aorta, limited ablation performed due to proximity of the focus to the right coronary artery  . EYE SURGERY    . LEFT HEART CATH AND CORONARY ANGIOGRAPHY N/A 11/07/2019   Procedure: LEFT HEART CATH AND CORONARY ANGIOGRAPHY;  Surgeon: Wellington Hampshire, MD;  Location: [facility] CV LAB;  Service: Cardiovascular;  Laterality: N/A;  . LEFT HEART CATHETERIZATION WITH CORONARY ANGIOGRAM N/A 11/25/2011   Procedure: LEFT HEART CATHETERIZATION WITH CORONARY ANGIOGRAM;  Surgeon: Leonie Man, MD;  Location[facility] LAB;  Service: Cardiovascular;  Laterality: N/A;  . PACEMAKER IMPLANT N/A 07/12/2018   Procedure: PACEMAKER IMPLANT;  Surgeon: Sanda Klein, MD;  Location: [facility] CV LAB;  Service: Cardiovascular;  Laterality: N/A;  . POSTERIOR FUSION [place]  . ROTATOR CUFF REPAIR  2000's   left  . V-TACH ABLATION N/A 06/06/2012   Procedure: V-TACH ABLATION;  Surgeon: Thompson Grayer, MD;  [facility];  Service: Cardiovascular;  Laterality: N/A;       Family History  Problem Relation Age of Onset  . Asthma Mother   . Other Other         Unsure if any heart disease in his family.    Social History   Tobacco Use  . Smoking status: Former Smoker    Years: 50.00    Types: Cigarettes    Quit date: 11/23/1999    Years since quitting: 21.3  . Smokeless tobacco: Never Used  Vaping Use  . Vaping Use: Never used  Substance Use Topics  . 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 Drug use: No    Home Medications Prior to Admission medications   Medication Sig Start Date End Date Taking? Authorizing Provider  cephALEXin (KEFLEX) 500 MG capsule Take 1 capsule (500 mg total) by mouth 4 (four) times daily. 03/31/21  Yes Jacqlyn Larsen, PA-C  albuterol (VENTOLIN HFA) 108 (90 Base) MCG/ACT inhaler Inhale into the lungs. Patient not taking: Reported on 02/24/2021    [provider]  aspirin EC 81 MG tablet Take 1 tablet (81 mg total) by mouth daily.  Swallow whole. 06/20/20   Troy Sine, MD  BREO ELLIPTA 100-25 MCG/INH AEPB 1 puff every morning. 05/07/20   [provider]  dorzolamide-timolol (COSOPT) 22.3-6.8 MG/ML ophthalmic solution  03/14/20   [provider]  furosemide (LASIX) 20 MG tablet TAKE 1 TABLET(20 MG) BY MOUTH DAILY 12/29/20   Croitoru, Mihai, MD  HYDROcodone-acetaminophen (NORCO/VICODIN) 5-325 MG tablet Take 1 tablet by mouth every 4 (four) hours as needed. Patient not taking: Reported on 02/24/2021 12/19/19   Ezequiel Essex, MD  isosorbide mononitrate (IMDUR) 30 MG 24 hr tablet Take 1 tablet by mouth daily. 02/24/20   [provider]  lidocaine (LIDODERM) 5 % Place 1 patch onto the skin daily. Remove & Discard patch within 12 hours or as directed by MD Patient not taking: Reported on 02/24/2021 12/19/19   Maudie Flakes, MD  LUMIGAN 0.01 % SOLN Place 1 drop into both eyes at bedtime.  12/06/14   [provider]  metoprolol succinate (TOPROL-XL) 100 MG 24 hr tablet TAKE 1 TABLET(100 MG) BY MOUTH DAILY WITH OR IMMEDIATELY  FOLLOWING A MEAL 11/05/20   Troy Sine, MD  nitroGLYCERIN (NITROSTAT) 0.4 MG SL tablet DISSOLVE 1 TABLET UNDER THE TONGUE EVERY 5 MINUTES AS NEEDED FOR CHEST PAIN Patient not taking: Reported on 02/24/2021 04/22/20   [provider]  RESTASIS 0.05 % ophthalmic emulsion Place 1 drop into both eyes daily. 12/30/14   [provider]  spironolactone (ALDACTONE) 25 MG tablet TAKE 1/2 TABLET(12.5 MG) BY MOUTH TWICE DAILY 07/09/20   Troy Sine, MD    Allergies    Patient has no known allergies.  Review of Systems   Review of Systems  Constitutional: Negative for chills and fever.  Respiratory: Negative for shortness of breath.   Cardiovascular: Negative for chest pain.  Gastrointestinal: Negative for abdominal pain, blood in stool, nausea and vomiting.  Genitourinary: Positive for hematuria. Negative for dysuria, flank pain, frequency, penile pain, penile swelling, scrotal swelling and testicular pain.  All other systems reviewed and are negative.   Physical Exam Updated Vital Signs BP (!) 157/99 (BP Location: Right Arm)   Pulse 72   Temp 97.7 F (36.5 C) (Oral)   Resp 17   SpO2 99%   Physical Exam Vitals and nursing note reviewed.  Constitutional:      General: He is not in acute distress.    Appearance: Normal appearance. He is well-developed. He is not ill-appearing or diaphoretic.  HENT:     Head: Normocephalic and atraumatic.  Eyes:     General:        Right eye: No discharge.        Left eye: No discharge.     Pupils: Pupils are equal, round, and reactive to light.  Cardiovascular:     Rate and Rhythm: Normal rate and regular rhythm.     Heart sounds: Normal heart sounds.  Pulmonary:     Effort: Pulmonary effort is normal. No respiratory distress.     Breath sounds: Normal breath sounds. No wheezing or rales.     Comments: Respirations equal and unlabored, patient able to speak in full sentences, lungs clear to auscultation bilaterally Abdominal:      General: Bowel sounds are normal. There is no distension.     Palpations: Abdomen is soft. There is no mass.     Tenderness: There is no abdominal tenderness. There is no guarding.     Comments: Abdomen soft, nondistended, bowel sounds present throughout, nontender to  palpation in all quadrants, specifically no focal suprapubic tenderness and no CVA tenderness bilaterally.  Musculoskeletal:        General: No deformity.     Cervical back: Neck supple.  Skin:    General: Skin is warm and dry.     Capillary Refill: Capillary refill takes less than 2 seconds.  Neurological:     Mental Status: He is alert.     Coordination: Coordination normal.     Comments: Speech is clear, able to follow commands Moves extremities without ataxia, coordination intact  Psychiatric:        Mood and Affect: Mood normal.        Behavior: Behavior normal.     ED Results / Procedures / Treatments   Labs (all labs ordered are listed, but only abnormal results are displayed) Labs Reviewed  CBC WITH DIFFERENTIAL/PLATELET - Abnormal; Notable for the following components:      Result Value   Platelets 129 (*)    All other components within normal limits  BASIC METABOLIC PANEL - Abnormal; Notable for the following components:   Glucose, Bld 101 (*)    Creatinine, Ser 1.55 (*)    GFR, Estimated 44 (*)    All other components within normal limits  URINALYSIS, ROUTINE W REFLEX MICROSCOPIC - Abnormal; Notable for the following components:   Color, Urine STRAW (*)    Hgb urine dipstick LARGE (*)    Leukocytes,Ua SMALL (*)    RBC / HPF >50 (*)    Bacteria, UA RARE (*)    All other components within normal limits  URINE CULTURE    EKG None  Radiology CT Renal Stone Study  Result Date: 03/31/2021 CLINICAL DATA:  Hematuria EXAM: CT ABDOMEN AND PELVIS WITHOUT CONTRAST TECHNIQUE: Multidetector CT imaging of the abdomen and pelvis was performed following the standard protocol without IV contrast. COMPARISON:   CT abdomen pelvis 06/04/2020 FINDINGS: Lower chest: Partially visualized lingular opacity, does not appear significantly changed compared to prior CT from October 2021. Evaluation of the abdominal viscera somewhat limited by the lack of IV contrast. Hepatobiliary: There is an indeterminate small hypo dense lesion along the inferior right hepatic lobe measuring 1.1 cm (series 3, image 28). Status post cholecystectomy. Pancreas: Unremarkable. No pancreatic ductal dilatation or surrounding inflammatory changes. Spleen: Redemonstrated small cyst anteriorly.  Normal in size. Adrenals/Urinary Tract: Adrenal glands are unremarkable. There are multiple small right greater than left renal calculi. The largest calculus in the right kidney measures 0.8 cm. No hydronephrosis. Bilateral renal cysts. Urinary bladder is unremarkable. Stomach/Bowel: Stomach is within normal limits. Appendix appears normal. No evidence of bowel wall thickening, distention, or inflammatory changes. Vascular/Lymphatic: Aortic atherosclerosis. Vascular patency cannot be assessed in the absence of IV contrast. No enlarged abdominal or pelvic lymph nodes. Reproductive: Dystrophic calcification again noted in the prostate. Other: No abdominal wall hernia. No abdominopelvic ascites. Musculoskeletal: Lumbar spine fusion hardware noted. Stable appearance of the spine with multilevel degenerative changes. No acute abnormality. IMPRESSION: 1.  Nonobstructing bilateral renal calculi.  No hydronephrosis. 2. There is an indeterminate small hypodense lesion in the inferior right hepatic lobe measuring 1.1 cm. Nonemergent liver MRI is recommended for further evaluation. 3. Partially visualized opacity in the lingula does not appear significantly changed from prior. 4.  Aortic atherosclerosis. Electronically Signed   By: Audie Pinto M.D.   On: 03/31/2021 15:05    Procedures Procedures   Medications Ordered in ED Medications  cephALEXin (KEFLEX)  capsule 500 mg (500 mg  Oral Given 03/31/21 1746)    ED Course  I have reviewed the triage vital signs and the nursing notes.  Pertinent labs & imaging results that were available during my care of the patient were reviewed by me and considered in my medical decision making (see chart for details).    MDM Rules/Calculators/A&P                        80 year old male presents to the ED for evaluation of painless hematuria that began this morning.  No associated dysuria or frequency, no fevers.  No flank or abdominal pain.  Labs, urinalysis, and CT renal stone study ordered from triage.  I have independently ordered, reviewed and interpreted all labs and imaging: CBC: No leukocytosis, normal hemoglobin BMP: No significant electrolyte derangements, renal function is at baseline at 1.55 UA: Concerning for potential infection with large hemoglobin, small leukocytes, RBCs and WBCs with rare bacteria present, culture pending  CT renal: Nonobstructing bilateral renal calculi, no hydronephrosis, incidental finding of hypodense lesion in the right hepatic lobe, nonemergent follow-up recommended.  Discussed with patient that he could have passed a stone earlier today, but that this could also be due to urinary tract infection.  Will treat with Keflex, cultures pending.  We will have him follow-up with PCP and urology.  Return precautions discussed.  No other aggravating or alleviating factors.   Final Clinical Impression(s) / ED Diagnoses Final diagnoses:  Hematuria, unspecified type  Acute UTI    Rx / DC Orders ED Discharge Orders         Ordered    cephALEXin (KEFLEX) 500 MG capsule  4 times daily        03/31/21 1727           Jacqlyn Larsen, [place] 04/01/21 0124    Drenda Freeze, MD 04/02/21 [phone]

## 2021-03-31 NOTE — ED Provider Notes (Signed)
Emergency Medicine Provider Triage Evaluation Note  [name] , a 80 y.o. male  was evaluated in triage.  Pt complains of painless hematuria.  H/o kidney stones. No OAC.   Review of Systems  Positive: hematuria Negative: Fever, nausea, vomiting, abdominal or flank pain  Physical Exam  BP (!) 135/93 (BP Location: Right Arm)   Pulse 70   Temp 98.2 F (36.8 C) (Oral)   Resp 16   SpO2 98%  Gen:   Awake, no distress   Resp:  Normal effort  MSK:   Moves extremities without difficulty  Abd:  No suprapubic or CVA tenderess  Medical Decision Making  Medically screening exam initiated at 1:42 PM.  Appropriate orders placed.  [name] was informed that the remainder of the evaluation will be completed by another provider, this initial triage assessment does not replace that evaluation, and the importance of remaining in the ED until their evaluation is complete.   Patient made aware this encounter is a triage and screening encounter and no beds are immediately available at this time in the ER.  Patient was informed that the remainder of the evaluation will be completed by another provider.  Patient made aware triage orders have been placed and patient will be placed in the waiting room while work up is initiated and until a room becomes available. Patient encouraged to await a formal ER encounter with a clinician.  Patient made aware that exiting the department prior to formal encounter with an ER clinician and completion of the work-up is considered leaving against medical advice.  At that time there is no guarantee that there are no emergency medical conditions present and patient assumes risks of leaving including worsening condition, permanent disability and death. Patient verbalizes understanding.     Kinnie Feil, PA-C 03/31/21 1342    Lennice Sites, DO 03/31/21 1619

## 2021-04-02 LAB — URINE CULTURE

## 2021-04-07 ENCOUNTER — Other Ambulatory Visit: Payer: Self-pay | Admitting: *Deleted

## 2021-04-07 NOTE — Patient Outreach (Signed)
[facility]) Care Management  04/07/2021  [name] [birth year] [id number]   Outgoing call placed to member, no answer, unable to leave voice message.  Will follow up within the next 3-4 business days.  [name], [place], MSN [place] [phone]

## 2021-04-10 ENCOUNTER — Other Ambulatory Visit: Payer: Self-pay | Admitting: *Deleted

## 2021-04-10 NOTE — Patient Outreach (Signed)
[name]) Care Management  04/10/2021  [name] [birth year] [id number]   Outreach attempt #2, unsuccessful, unable to leave voice message.  Will send outreach letter and follow up within the next 3-4 business days.  [name], [place], MSN [place] [phone]

## 2021-04-16 ENCOUNTER — Other Ambulatory Visit: Payer: Self-pay | Admitting: *Deleted

## 2021-04-16 NOTE — Patient Outreach (Signed)
[facility]) Care Management  04/16/2021  [name] 29-Sep-1941 [id number]   Outreach attempt #3, unsuccessful, unable to leave voice message.  Will make 4th and final attempt within the next 3 weeks.  If remains unsuccessful, will close case due to inability to maintain contact.  [name], [place], MSN [place] [phone]

## 2021-05-07 ENCOUNTER — Other Ambulatory Visit: Payer: Self-pay | Admitting: *Deleted

## 2021-05-07 NOTE — Patient Outreach (Signed)
[place] [facility]) Care Management  05/07/2021  [name] 1940/12/17 [id number]   Outreach attempt #4, unsuccessful, HIPAA compliant voice message left.  No response from member after multiple unsuccessful outreach attempts and letter sent.  Will close case at this time due to inability to maintain contact.  Will notify member and primary MD of case closure.  [name], [place], MSN [place] [phone]

## 2021-05-13 ENCOUNTER — Other Ambulatory Visit: Payer: Self-pay | Admitting: Cardiovascular Disease

## 2021-05-29 ENCOUNTER — Ambulatory Visit: Payer: Medicaid Other | Admitting: Podiatry

## 2021-06-10 ENCOUNTER — Ambulatory Visit (INDEPENDENT_AMBULATORY_CARE_PROVIDER_SITE_OTHER): Payer: Medicare Other | Admitting: Podiatry

## 2021-06-10 ENCOUNTER — Other Ambulatory Visit: Payer: Self-pay

## 2021-06-10 ENCOUNTER — Encounter: Payer: Self-pay | Admitting: Podiatry

## 2021-06-10 DIAGNOSIS — I739 Peripheral vascular disease, unspecified: Secondary | ICD-10-CM | POA: Diagnosis not present

## 2021-06-10 DIAGNOSIS — M79675 Pain in left toe(s): Secondary | ICD-10-CM

## 2021-06-10 DIAGNOSIS — B351 Tinea unguium: Secondary | ICD-10-CM

## 2021-06-10 DIAGNOSIS — N1831 Chronic kidney disease, stage 3a: Secondary | ICD-10-CM | POA: Diagnosis not present

## 2021-06-10 DIAGNOSIS — D689 Coagulation defect, unspecified: Secondary | ICD-10-CM

## 2021-06-10 DIAGNOSIS — M79674 Pain in right toe(s): Secondary | ICD-10-CM

## 2021-06-10 NOTE — Progress Notes (Signed)
This patient returns to my office for at risk foot care.  This patient requires this care by a professional since this patient will be at risk due to having , PAD and  Chronic kidney disease.  .  This patient is unable to cut nails himself since the patient cannot reach his nails.These nails are painful walking and wearing shoes.  This patient presents for at risk foot care today.  General Appearance  Alert, conversant and in no acute stress.  Vascular  Dorsalis pedis and posterior tibial  pulses are palpable  bilaterally.  Capillary return is within normal limits  bilaterally. Temperature is within normal limits  bilaterally.  Neurologic  Senn-Weinstein monofilament wire test within normal limits  bilaterally. Muscle power within normal limits bilaterally.  Nails Thick disfigured discolored nails with subungual debris  from hallux to fifth toes bilaterally. No evidence of bacterial infection or drainage bilaterally.  Orthopedic  No limitations of motion  feet .  No crepitus or effusions noted.  No bony pathology or digital deformities noted.  Skin  normotropic skin with no porokeratosis noted bilaterally.  No signs of infections or ulcers noted.     Onychomycosis  Pain in right toes  Pain in left toes  Consent was obtained for treatment procedures.   Mechanical debridement of nails 1-5  bilaterally performed with a nail nipper.  Filed with dremel without incident.    Return office visit   3 months                   Told patient to return for periodic foot care and evaluation due to potential at risk complications.   Gardiner Barefoot DPM

## 2021-06-18 ENCOUNTER — Telehealth: Payer: Self-pay | Admitting: Cardiovascular Disease

## 2021-06-18 MED ORDER — FUROSEMIDE 20 MG PO TABS: ORAL_TABLET | ORAL | 11 refills | Status: DC

## 2021-06-18 NOTE — Telephone Encounter (Signed)
*  STAT* If patient is at the pharmacy, call can be transferred to refill team.   1. Which medications need to be refilled? (please list name of each medication and dose if known)  furosemide (LASIX) 20 MG tablet  2. Which pharmacy/location (including street and city if local pharmacy) is medication to be sent to? [place], [place] - [address] AT [place]  3. Do they need a 30 day or 90 day supply?

## 2021-06-18 NOTE — Telephone Encounter (Signed)
Spoke with Patient, informed him that a refill for his Lasix has been sent to [facility] 06/18/21

## 2021-06-26 DIAGNOSIS — L309 Dermatitis, unspecified: Secondary | ICD-10-CM | POA: Diagnosis not present

## 2021-06-26 DIAGNOSIS — N1831 Chronic kidney disease, stage 3a: Secondary | ICD-10-CM | POA: Diagnosis not present

## 2021-06-26 DIAGNOSIS — E1169 Type 2 diabetes mellitus with other specified complication: Secondary | ICD-10-CM | POA: Diagnosis not present

## 2021-06-26 DIAGNOSIS — I10 Essential (primary) hypertension: Secondary | ICD-10-CM | POA: Diagnosis not present

## 2021-06-26 DIAGNOSIS — H42 Glaucoma in diseases classified elsewhere: Secondary | ICD-10-CM | POA: Diagnosis not present

## 2021-06-30 DIAGNOSIS — Z1152 Encounter for screening for COVID-19: Secondary | ICD-10-CM | POA: Diagnosis not present

## 2021-07-03 DIAGNOSIS — Z1152 Encounter for screening for COVID-19: Secondary | ICD-10-CM | POA: Diagnosis not present

## 2021-07-05 ENCOUNTER — Emergency Department (HOSPITAL_COMMUNITY)
Admission: EM | Admit: 2021-07-05 | Discharge: 2021-07-06 | Disposition: A | Discharge status: Home / Self Care | Payer: Medicare Other | Attending: Emergency Medicine | Admitting: Emergency Medicine

## 2021-07-05 ENCOUNTER — Encounter (HOSPITAL_COMMUNITY): Payer: Self-pay | Admitting: *Deleted

## 2021-07-05 ENCOUNTER — Other Ambulatory Visit: Payer: Self-pay

## 2021-07-05 ENCOUNTER — Emergency Department (HOSPITAL_COMMUNITY): Payer: Medicare Other

## 2021-07-05 DIAGNOSIS — R319 Hematuria, unspecified: Secondary | ICD-10-CM

## 2021-07-05 DIAGNOSIS — Z950 Presence of cardiac pacemaker: Secondary | ICD-10-CM | POA: Diagnosis not present

## 2021-07-05 DIAGNOSIS — Z7982 Long term (current) use of aspirin: Secondary | ICD-10-CM | POA: Insufficient documentation

## 2021-07-05 DIAGNOSIS — J449 Chronic obstructive pulmonary disease, unspecified: Secondary | ICD-10-CM | POA: Diagnosis not present

## 2021-07-05 DIAGNOSIS — M4856XA Collapsed vertebra, not elsewhere classified, lumbar region, initial encounter for fracture: Secondary | ICD-10-CM | POA: Diagnosis not present

## 2021-07-05 DIAGNOSIS — I5032 Chronic diastolic (congestive) heart failure: Secondary | ICD-10-CM | POA: Insufficient documentation

## 2021-07-05 DIAGNOSIS — Z79899 Other long term (current) drug therapy: Secondary | ICD-10-CM | POA: Diagnosis not present

## 2021-07-05 DIAGNOSIS — Z87891 Personal history of nicotine dependence: Secondary | ICD-10-CM | POA: Diagnosis not present

## 2021-07-05 DIAGNOSIS — I2510 Atherosclerotic heart disease of native coronary artery without angina pectoris: Secondary | ICD-10-CM | POA: Diagnosis not present

## 2021-07-05 DIAGNOSIS — I130 Hypertensive heart and chronic kidney disease with heart failure and stage 1 through stage 4 chronic kidney disease, or unspecified chronic kidney disease: Secondary | ICD-10-CM | POA: Insufficient documentation

## 2021-07-05 DIAGNOSIS — N183 Chronic kidney disease, stage 3 unspecified: Secondary | ICD-10-CM | POA: Diagnosis not present

## 2021-07-05 DIAGNOSIS — N200 Calculus of kidney: Secondary | ICD-10-CM | POA: Diagnosis not present

## 2021-07-05 DIAGNOSIS — N281 Cyst of kidney, acquired: Secondary | ICD-10-CM | POA: Diagnosis not present

## 2021-07-05 LAB — CBC WITH DIFFERENTIAL/PLATELET
Abs Immature Granulocytes: 0.06 10*3/uL (ref 0.00–0.07)
Basophils Absolute: 0 10*3/uL (ref 0.0–0.1)
Basophils Relative: 1 %
Eosinophils Absolute: 0.4 10*3/uL (ref 0.0–0.5)
Eosinophils Relative: 9 %
HCT: 40.3 % (ref 39.0–52.0)
Hemoglobin: 13.5 g/dL (ref 13.0–17.0)
Immature Granulocytes: 1 %
Lymphocytes Relative: 36 %
Lymphs Abs: 1.5 10*3/uL (ref 0.7–4.0)
MCH: 31.1 pg (ref 26.0–34.0)
MCHC: 33.5 g/dL (ref 30.0–36.0)
MCV: 92.9 fL (ref 80.0–100.0)
Monocytes Absolute: 0.5 10*3/uL (ref 0.1–1.0)
Monocytes Relative: 11 %
Neutro Abs: 1.8 10*3/uL (ref 1.7–7.7)
Neutrophils Relative %: 42 %
Platelets: 129 10*3/uL — ABNORMAL LOW (ref 150–400)
RBC: 4.34 MIL/uL (ref 4.22–5.81)
RDW: 12.8 % (ref 11.5–15.5)
WBC: 4.3 10*3/uL (ref 4.0–10.5)
nRBC: 0 % (ref 0.0–0.2)

## 2021-07-05 LAB — COMPREHENSIVE METABOLIC PANEL
ALT: 17 U/L (ref 0–44)
AST: 20 U/L (ref 15–41)
Albumin: 3.6 g/dL (ref 3.5–5.0)
Alkaline Phosphatase: 66 U/L (ref 38–126)
Anion gap: 7 (ref 5–15)
BUN: 16 mg/dL (ref 8–23)
CO2: 25 mmol/L (ref 22–32)
Calcium: 9.3 mg/dL (ref 8.9–10.3)
Chloride: 108 mmol/L (ref 98–111)
Creatinine, Ser: 1.68 mg/dL — ABNORMAL HIGH (ref 0.61–1.24)
GFR, Estimated: 40 mL/min — ABNORMAL LOW (ref >60)
Glucose, Bld: 106 mg/dL — ABNORMAL HIGH (ref 70–99)
Potassium: 3.3 mmol/L — ABNORMAL LOW (ref 3.5–5.1)
Sodium: 140 mmol/L (ref 135–145)
Total Bilirubin: 1.1 mg/dL (ref 0.3–1.2)
Total Protein: 7.4 g/dL (ref 6.5–8.1)

## 2021-07-05 LAB — URINALYSIS, ROUTINE W REFLEX MICROSCOPIC
Glucose, UA: 250 mg/dL — AB
Ketones, ur: 15 mg/dL — AB
Nitrite: POSITIVE — AB
Protein, ur: 100 mg/dL — AB
Specific Gravity, Urine: 1.015 (ref 1.005–1.030)
pH: 6.5 (ref 5.0–8.0)

## 2021-07-05 LAB — URINALYSIS, MICROSCOPIC (REFLEX): RBC / HPF: >50 RBC/hpf (ref 0–5)

## 2021-07-05 LAB — LIPASE, BLOOD: Lipase: 25 U/L (ref 11–51)

## 2021-07-05 NOTE — ED Provider Notes (Signed)
Emergency Medicine Provider Triage Evaluation Note  [name] , a 80 y.o. male  was evaluated in triage.  Pt complains of itchy area x1 day.  He states when urinating 1 time today it was painful and he felt like there was something stuck in the head of his penis.  He has history of kidney stones.  He states he has a slight pain in his abdomen, unsure if it is related.  Denies history of tobacco abuse  Review of Systems  Positive: Gross hematuria, abdominal pain Negative: Fever, chills, back pain, nausea, emesis, penile or scrotal pain or swelling  Physical Exam  BP (!) 158/87 (BP Location: Left Arm)   Pulse 69   Temp 98.9 F (37.2 C) (Oral)   Resp 16   Ht '5\' 8"'$  (1.727 m)   Wt 76.7 kg   SpO2 100%   BMI 25.70 kg/m  Gen:   Awake, no distress   Resp:  Normal effort  MSK:   Moves extremities without difficulty  Other:  No CVA tenderness.  Tender to palpation of left upper quadrant.  Medical Decision Making  Medically screening exam initiated at 6:50 PM.  Appropriate orders placed.  [name] was informed that the remainder of the evaluation will be completed by another provider, this initial triage assessment does not replace that evaluation, and the importance of remaining in the ED until their evaluation is complete.  Possible kidney stone, work-up initiated with labs and imaging.   Portions of this note were generated with Lobby[employer]. Dictation errors may occur despite best attempts at proofreading.    Barrie Folk, PA-C 07/05/21 1855    Jeanell Sparrow, DO 07/05/21 2213

## 2021-07-05 NOTE — ED Triage Notes (Signed)
The pt has been having bloody urine since this am  he does not have any pain  hx of kidney stones

## 2021-07-06 NOTE — ED Notes (Signed)
Pt alert, NAD, calm, interactive, resps e/u, speaking in clear complete sentences, VSS. Denies pain or nausea. Reports "always sob, feel normal".

## 2021-07-06 NOTE — ED Provider Notes (Signed)
[facility] EMERGENCY DEPARTMENT Provider Note   CSN: [id number] Arrival date & time: 07/05/21  1817     History Chief Complaint  Patient presents with   Hematuria    [name] is a 80 y.o. male.   Hematuria Pertinent negatives include no chest pain and no abdominal pain. Patient presents with hematuria.  Began yesterday.  Had a 15-hour delay between coming to the ER and being seen by me although had been screened prior.  States has been passing blood.  States has had a before from kidney stones.  No pain however.  States he has been able to urinate freely.  Not on blood thinners.  Has seen urology previously.  States he is on a water pill.  No bruising.  No abdominal pain.     Past Medical History:  Diagnosis Date   CAD (coronary artery disease) 80% stenosis diag of the LAD, 30% in OM2 branch of LCX in 2009    a. Nonobstructive CAD by cath 11/2011 with the exception of the pre-existing diagonal branch #2 lesion.   Chronic systolic CHF (congestive heart failure) (HCC)    CKD (chronic kidney disease) stage 3, GFR 30-59 ml/min (HCC)    Colon polyp, hyperplastic    History of stress test 06/01/2012   Normal myocardial perfusion study. compared to the previous study there is no significant change. this is a low risk scan   Hypertension    Legally blind    "both eyes"   Myocardial infarction [facility] 11/22/11   NICM (nonischemic cardiomyopathy) ([place])    a. Remote hx of dilated NICM with EF ranging 20-45%, including normal EF by echo (55-60%) in 2014.   Peripheral arterial disease ([place])    a. 06/2014: ABI right 0.99, left 1.2, LE dopplers revealing an occluded right posterior tibial. As symptoms were not felt r/t claudication, no further w/u at the time.   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 Requiring discontinuation of BB dose.   Spondylolisthesis    Ventricular bigeminy    Ventricular tachycardia  (paroxysmal) ([place]) 04/11/2015    Patient Active Problem List   Diagnosis Date Noted   Abnormal CT of the chest 09/04/2020   COPD (chronic obstructive pulmonary disease) ([place]) 09/04/2020   ICD (implantable cardioverter-defibrillator) in place    Cardiac syncope 11/07/2019   Closed fracture of distal fibula 11/07/2019   Pain due to onychomycosis of toenails of both feet 05/16/2019   Coagulation disorder ([place]) 05/16/2019   Paroxysmal atrial fibrillation ([place]) 08/02/2018   NSVT (nonsustained ventricular tachycardia) ([place]) 08/02/2018   Symptomatic bradycardia 07/12/2018   Pacemaker 07/12/2018   Tachycardia-bradycardia syndrome ([place])    Unsteady gait 03/12/2016   Nausea vomiting and diarrhea 03/11/2016   Acute lower UTI 03/11/2016   Gastroenteritis 03/11/2016   Obstipation 03/11/2016   Abdominal pain    Constipation    Lactic acidosis    Leg pain    Essential hypertension 03/04/2016   Ventricular tachycardia (paroxysmal) ([place]) 04/11/2015   Chest pain with moderate risk for cardiac etiology 04/09/2015   CAP (community acquired pneumonia) 02/18/2015   Chest pain at rest 02/18/2015   Peripheral arterial disease ([place]) 08/15/2014   Hyperlipidemia with target LDL less than 70 01/01/2014   GERD (gastroesophageal reflux disease) 01/01/2014   First degree AV block 07/31/2012   Unstable angina, negative MI 06/05/2012   Second degree AV block, Mobitz type I 05/27/2012   CKD (chronic  kidney disease) stage 3, GFR 30-59 ml/min (HCC) 05/27/2012   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; 20% January 2013 35% April 2013 - normalized in 2014 05/26/2012   Chronic diastolic CHF (congestive heart failure) ([place]) 11/23/2011    Class: Diagnosis of   Coronary artery disease involving native coronary artery of native heart without angina pectoris 11/22/2011   Premature ventricular contractions 11/22/2011   Esophageal reflux 07/28/2011    Past Surgical History:  Procedure Laterality Date    BACK SURGERY     BIV UPGRADE N/A 11/08/2019   Procedure: BIV UPGRADE;  Surgeon: Evans Lance, MD;  Location: [place] CV LAB;  Service: Cardiovascular;  Laterality: N/A;   CARDIAC CATHETERIZATION  11/2011   CARDIAC CATHETERIZATION  11/2011   didn't demonstrate high grade obstructive disease to account for his LV dysfunction.   CATARACT EXTRACTION, BILATERAL  1990's   CYSTOSCOPY     CYSTOSCOPY WITH URETHRAL DILATATION N/A 05/04/2013   Procedure: CYSTOSCOPY WITH URETHRAL DILATATION;  Surgeon: Eustace Moore, MD;  Location: [place] NEURO ORS;  Service: Neurosurgery;  Laterality: N/A;  with insertion of foley catheter   EP study and ablation of VT  7/13   PVC focus mapped to the right coronary cusp of the aorta, limited ablation performed due to proximity of the focus to the right coronary artery   EYE SURGERY     LEFT HEART CATH AND CORONARY ANGIOGRAPHY N/A 11/07/2019   Procedure: LEFT HEART CATH AND CORONARY ANGIOGRAPHY;  Surgeon: Wellington Hampshire, MD;  Location: [place] CV LAB;  Service: Cardiovascular;  Laterality: N/A;   LEFT HEART CATHETERIZATION WITH CORONARY ANGIOGRAM N/A 11/25/2011   Procedure: LEFT HEART CATHETERIZATION WITH CORONARY ANGIOGRAM;  Surgeon: Leonie Man, MD;  Location: [facility] C[facility] LAB;  Service: Cardiovascular;  Laterality: N/A;   PACEMAKER IMPLANT N/A 07/12/2018   Procedure: PACEMAKER IMPLANT;  Surgeon: Sanda Klein, MD;  Location: [place] CV LAB;  Service: Cardiovascular;  Laterality: N/A;   POSTERIOR FUSION LUMBAR SPINE  1979   ROTATOR CUFF REPAIR  2000's   left   V-TACH ABLATION N/A 06/06/2012   Procedure: V-TACH ABLATION;  Surgeon: Thompson Grayer, MD;  Location: [facility] CATH LAB;  Service: Cardiovascular;  Laterality: N/A;       Family History  Problem Relation Age of Onset   Asthma Mother    Other Other        Unsure if any heart disease in his family.    Social History   Tobacco Use   Smoking status: Former    Years: 50.00    Types: Cigarettes    Quit  date: 11/23/1999    Years since quitting: 21.6   Smokeless tobacco: Never  Vaping Use   Vaping Use: Never used  Substance Use Topics   Alcohol use: No    Comment: 05/26/12 "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; stopped drinking in the early 1980's"   Drug use: No    Home Medications Prior to Admission medications   Medication Sig Start Date End Date Taking? Authorizing Provider  albuterol (VENTOLIN HFA) 108 (90 Base) MCG/ACT inhaler Inhale into the lungs. Patient not taking: Reported on 02/24/2021    [provider]  aspirin EC 81 MG tablet Take 1 tablet (81 mg total) by mouth daily. Swallow whole. 06/20/20   Troy Sine, MD  BREO ELLIPTA 100-25 MCG/INH AEPB 1 puff every morning. 05/07/20   [provider]  cephALEXin (KEFLEX) 500 MG capsule Take 1 capsule (500 mg total) by mouth  4 (four) times daily. 03/31/21   Jacqlyn Larsen, PA-C  dorzolamide-timolol (COSOPT) 22.3-6.8 MG/ML ophthalmic solution  03/14/20   [provider]  furosemide (LASIX) 20 MG tablet TAKE 1 TABLET(20 MG) BY MOUTH DAILY 06/18/21   Troy Sine, MD  HYDROcodone-acetaminophen (NORCO/VICODIN) 5-325 MG tablet Take 1 tablet by mouth every 4 (four) hours as needed. Patient not taking: Reported on 02/24/2021 12/19/19   Ezequiel Essex, MD  isosorbide mononitrate (IMDUR) 30 MG 24 hr tablet Take 1 tablet by mouth daily. 02/24/20   [provider]  lidocaine (LIDODERM) 5 % Place 1 patch onto the skin daily. Remove & Discard patch within 12 hours or as directed by MD Patient not taking: Reported on 02/24/2021 12/19/19   Maudie Flakes, MD  LUMIGAN 0.01 % SOLN Place 1 drop into both eyes at bedtime.  12/06/14   [provider]  metoprolol succinate (TOPROL-XL) 100 MG 24 hr tablet TAKE 1 TABLET(100 MG) BY MOUTH DAILY WITH OR IMMEDIATELY FOLLOWING A MEAL 11/05/20   Troy Sine, MD  nitroGLYCERIN (NITROSTAT) 0.4 MG SL tablet DISSOLVE 1 TABLET UNDER THE TONGUE EVERY 5 MINUTES AS NEEDED FOR CHEST PAIN Patient not  taking: Reported on 02/24/2021 04/22/20   [provider]  RESTASIS 0.05 % ophthalmic emulsion Place 1 drop into both eyes daily. 12/30/14   [provider]  spironolactone (ALDACTONE) 25 MG tablet TAKE 1/2 TABLET(12.5 MG) BY MOUTH TWICE DAILY 05/13/21   Troy Sine, MD    Allergies    Patient has no known allergies.  Review of Systems   Review of Systems  Constitutional:  Negative for appetite change.  HENT:  Negative for congestion.   Cardiovascular:  Negative for chest pain.  Gastrointestinal:  Negative for abdominal pain.  Genitourinary:  Positive for hematuria.  Musculoskeletal:  Negative for back pain.  Skin:  Negative for rash.  Neurological:  Negative for weakness.  Psychiatric/Behavioral:  Negative for confusion.    Physical Exam Updated Vital Signs BP (!) 174/99 (BP Location: Left Arm)   Pulse 74   Temp (!) 97.3 F (36.3 C) (Oral)   Resp 18   Ht '5\' 8"'$  (1.727 m)   Wt 76.7 kg   SpO2 99%   BMI 25.70 kg/m   Physical Exam Vitals and nursing note reviewed.  HENT:     Head: Normocephalic.  Eyes:     Extraocular Movements: Extraocular movements intact.     Pupils: Pupils are equal, round, and reactive to light.  Cardiovascular:     Rate and Rhythm: Normal rate and regular rhythm.  Pulmonary:     Breath sounds: No wheezing.  Abdominal:     Tenderness: There is no abdominal tenderness.  Musculoskeletal:        General: No tenderness.     Cervical back: Neck supple.  Skin:    General: Skin is warm.     Capillary Refill: Capillary refill takes less than 2 seconds.  Neurological:     Mental Status: He is alert and oriented to person, place, and time.  Psychiatric:        Mood and Affect: Mood normal.    ED Results / Procedures / Treatments   Labs (all labs ordered are listed, but only abnormal results are displayed) Labs Reviewed  CBC WITH DIFFERENTIAL/PLATELET - Abnormal; Notable for the following components:      Result Value   Platelets  129 (*)    All other components within normal limits  COMPREHENSIVE METABOLIC  PANEL - Abnormal; Notable for the following components:   Potassium 3.3 (*)    Glucose, Bld 106 (*)    Creatinine, Ser 1.68 (*)    GFR, Estimated 40 (*)    All other components within normal limits  URINALYSIS, ROUTINE W REFLEX MICROSCOPIC - Abnormal; Notable for the following components:   Color, Urine RED (*)    APPearance TURBID (*)    Glucose, UA 250 (*)    Hgb urine dipstick LARGE (*)    Bilirubin Urine MODERATE (*)    Ketones, ur 15 (*)    Protein, ur 100 (*)    Nitrite POSITIVE (*)    Leukocytes,Ua MODERATE (*)    All other components within normal limits  URINALYSIS, MICROSCOPIC (REFLEX) - Abnormal; Notable for the following components:   Bacteria, UA RARE (*)    All other components within normal limits  URINE CULTURE  LIPASE, BLOOD    EKG None  Radiology CT Renal Stone Study  Result Date: 07/05/2021 CLINICAL DATA:  80 year old with hematuria. EXAM: CT ABDOMEN AND PELVIS WITHOUT CONTRAST TECHNIQUE: Multidetector CT imaging of the abdomen and pelvis was performed following the standard protocol without IV contrast. COMPARISON:  Noncontrast CT 03/31/2021, contrast enhanced CT 12/19/2019 FINDINGS: Lower chest: No focal airspace disease or pleural effusion. Cardiomegaly with pacemaker wires in place. Hepatobiliary: The subcentimeter low-density in the right lobe of the liver on prior contrast-enhanced exam is less well-defined currently, but grossly stable, series 3, image 26. No new or suspicious liver lesion. Cholecystectomy without biliary dilatation. Pancreas: No ductal dilatation or inflammation. Spleen: Normal in size. Low-density in the anterior spleen is stable from prior, series 3, image 11. Adrenals/Urinary Tract: Normal adrenal glands. A least 4 nonobstructing calculi in the right kidney, largest in the upper pole measures 8 mm. There is a 2 cm cyst in the lower medial right kidney. No  evidence of suspicious renal lesion. There are 2 nonobstructing stones in the left kidney, both punctate. 3.7 cm cyst arises from the lower left kidney. No evidence of solid lesion. Both ureters are decompressed without evidence of ureteral stone. Unremarkable urinary bladder. No bladder wall thickening or stone. Stomach/Bowel: Ingested material within the stomach. No obvious gastric wall thickening. Small duodenal diverticulum. No small bowel obstruction or inflammation. There is mild fecalization of distal small bowel contents. Normal appendix. Small to moderate colonic stool burden without colonic wall thickening or inflammatory change. Vascular/Lymphatic: Advanced aortic atherosclerosis. Bi-iliac tortuosity. No aortic aneurysm. No portal venous or mesenteric gas. No abdominopelvic adenopathy. Reproductive: Coarse prostatic calcifications. Other: No free air, free fluid, or intra-abdominal fluid collection. Musculoskeletal: Extensive postsurgical change throughout the lumbar spine with posterior fusion hardware. Remote L1 compression fracture. Sclerotic focus adjacent to the right sacroiliac joint is similar to prior and likely bone islands. There is minimal trabecular coarsening of the right iliac bone. IMPRESSION: 1. Bilateral nonobstructing renal calculi. No hydronephrosis or obstructive uropathy. 2. Bilateral renal cysts. No evidence of solid renal mass. Aortic Atherosclerosis (ICD10-I70.0). Electronically Signed   By: Keith Rake M.D.   On: 07/05/2021 19:36    Procedures Procedures   Medications Ordered in ED Medications - No data to display  ED Course  I have reviewed the triage vital signs and the nursing notes.  Pertinent labs & imaging results that were available during my care of the patient were reviewed by me and considered in my medical decision making (see chart for details).    MDM Rules/Calculators/A&P  Patient with hematuria.  History of same.   Known kidney stones.  No flank pain however.  Good kidney function.  CT scan done and did not show obstruction.  Urine did show blood but also some nitrites.  Will not apparently treat but culture sent.  Would probably treat if culture comes back positive.  Has urology that he seen previously.  Hemoglobin reassuring.  Discharge home Final Clinical Impression(s) / ED Diagnoses Final diagnoses:  Hematuria, unspecified type    Rx / DC Orders ED Discharge Orders     None        Davonna Belling, MD 07/06/21 [phone]

## 2021-07-06 NOTE — ED Notes (Addendum)
Pt seen by EDP prior to RN assessment, seen, treated and d/c'd prior to RN assessment, see MD notes, all orders completed. Labs, VS and imaging reviewed.

## 2021-07-08 LAB — URINE CULTURE: Culture: >=100000 — AB

## 2021-07-09 ENCOUNTER — Telehealth: Payer: Self-pay

## 2021-07-09 NOTE — Telephone Encounter (Signed)
Post ED Visit - Positive Culture Follow-up  Culture report reviewed by antimicrobial stewardship pharmacist: [place] Team '[]'$  Elenor Quinones, Pharm.D. '[]'$  Heide Guile, Pharm.D., BCPS AQ-ID '[]'$  Parks Neptune, Pharm.D., BCPS '[]'$  Alycia Rossetti, Pharm.D., BCPS '[]'$  [place], [place].D., BCPS, AAHIVP '[]'$  Legrand Como, Pharm.D., BCPS, AAHIVP '[]'$  Salome Arnt, PharmD, BCPS '[]'$  Johnnette Gourd, PharmD, BCPS '[]'$  Hughes Better, PharmD, BCPS '[]'$  Leeroy Cha, PharmD '[]'$  Laqueta Linden, PharmD, BCPS '[]'$  Albertina Parr, PharmD  [place] Team '[]'$  Leodis Sias, PharmD '[]'$  Lindell Spar, PharmD '[]'$  Royetta Asal, PharmD '[]'$  Graylin Shiver, Rph '[]'$  Rema Fendt) Glennon Mac, PharmD '[]'$  Arlyn Dunning, PharmD '[]'$  Netta Cedars, PharmD '[]'$  Dia Sitter, PharmD '[]'$  Leone Haven, PharmD '[]'$  Gretta Arab, PharmD '[]'$  Theodis Shove, PharmD '[]'$  Peggyann Juba, PharmD '[]'$  Reuel Boom, PharmD   Positive urine culture Culture results reviewed by Dr. Dene Gentry, MD Plan - No Antibiotics indicated   Glennon Hamilton 07/09/2021, 9:59 AM

## 2021-07-10 DIAGNOSIS — R310 Gross hematuria: Secondary | ICD-10-CM | POA: Diagnosis not present

## 2021-07-13 ENCOUNTER — Telehealth: Payer: Self-pay | Admitting: Cardiovascular Disease

## 2021-07-13 ENCOUNTER — Other Ambulatory Visit: Payer: Self-pay | Admitting: Urology

## 2021-07-13 DIAGNOSIS — D414 Neoplasm of uncertain behavior of bladder: Secondary | ICD-10-CM | POA: Diagnosis not present

## 2021-07-13 DIAGNOSIS — R310 Gross hematuria: Secondary | ICD-10-CM | POA: Diagnosis not present

## 2021-07-13 DIAGNOSIS — N1831 Chronic kidney disease, stage 3a: Secondary | ICD-10-CM | POA: Diagnosis not present

## 2021-07-13 DIAGNOSIS — N209 Urinary calculus, unspecified: Secondary | ICD-10-CM | POA: Diagnosis not present

## 2021-07-13 DIAGNOSIS — Z1152 Encounter for screening for COVID-19: Secondary | ICD-10-CM | POA: Diagnosis not present

## 2021-07-13 DIAGNOSIS — E785 Hyperlipidemia, unspecified: Secondary | ICD-10-CM | POA: Diagnosis not present

## 2021-07-13 DIAGNOSIS — I130 Hypertensive heart and chronic kidney disease with heart failure and stage 1 through stage 4 chronic kidney disease, or unspecified chronic kidney disease: Secondary | ICD-10-CM | POA: Diagnosis not present

## 2021-07-13 DIAGNOSIS — I5032 Chronic diastolic (congestive) heart failure: Secondary | ICD-10-CM | POA: Diagnosis not present

## 2021-07-13 NOTE — Telephone Encounter (Signed)
    Name: [name]  DOB: 03-21-1941  MRN: [id number]  Primary Cardiologist: Shelva Majestic, MD / EP - Dr. Lovena Le  Chart reviewed as part of pre-operative protocol coverage. Because of [name]'s past medical history and time since last visit, he will require a follow-up visit in order to better assess preoperative cardiovascular risk.  Patient has complex PMH to include nonobstructive CAD, chronic systolic CHF / NICM with varying EF, PAD, PVCs, 2nd degree AVB, VT, PVCs, CKD, atrial fib. At last OV 01/12/21 it was recommended he follow-up in 6 months for re-evaluation. This has not yet occurred. Given his complexity and advanced age, would recommend in-person evaluation for formal surgical clearance.  Pre-op covering staff: - Please schedule appointment and call patient to inform them. Pt had appt in October with Dr. Claiborne Billings - can perhaps leave this for now, but arrange add-on OV to APP schedule next opening.  - Please contact requesting surgeon's office via preferred method (i.e, phone, fax) to inform them of need for appointment prior to surgery.  Regarding antiplatelet agent, it is listed that patient will not need to hold anything prior to surgery.  Charlie Pitter, PA-C  07/13/2021, 2:49 PM

## 2021-07-13 NOTE — Telephone Encounter (Signed)
Pt has been scheduled for pre op appt with Roby Lofts, [facility] 07/21/21 @ 9:45. I will forward clearance notes to [facility] for upcoming appt. Will send FYI to surgeon's office pt has appt 07/21/21.

## 2021-07-13 NOTE — Telephone Encounter (Signed)
   [facility] Pre-operative Risk Assessment    Patient Name: TOMI PADDOCK  DOB: 1941/09/22 MRN: [id number]  HEARTCARE STAFF:  - IMPORTANT!!!!!! Under Visit Info/Reason for Call, type in Other and utilize the format Clearance MM/DD/YY or Clearance TBD. Do not use dashes or single digits. - Please review there is not already an duplicate clearance open for this procedure. - If request is for dental extraction, please clarify the # of teeth to be extracted. - If the patient is currently at the dentist's office, call Pre-Op Callback Staff (MA/nurse) to input urgent request.  - If the patient is not currently in the dentist office, please route to the Pre-Op pool.  Request for surgical clearance:  What type of surgery is being performed? Bladder Biopsy  When is this surgery scheduled? 08-03-21  What type of clearance is required (medical clearance vs. Pharmacy clearance to hold med vs. Both)? Medical  Are there any medications that need to be held prior to surgery and how long? no Practice name and name of physician performing surgery?  Dr Link Snuffer  What is the office phone number? [phone] x [phone]   7.   What is the office fax number? [phone]  8.   Anesthesia type (None, local, MAC, general) ? General   Glyn Ade 07/13/2021, 10:23 AM  _________________________________________________________________   (provider comments below)

## 2021-07-15 ENCOUNTER — Encounter: Payer: Self-pay | Admitting: Cardiovascular Disease

## 2021-07-15 NOTE — Patient Instructions (Addendum)
DUE TO COVID-19 ONLY ONE VISITOR IS ALLOWED TO COME WITH YOU AND STAY IN THE WAITING ROOM ONLY DURING PRE OP AND PROCEDURE.   **NO VISITORS ARE ALLOWED IN THE SHORT STAY AREA OR RECOVERY ROOM!!**  IF YOU WILL BE ADMITTED INTO THE HOSPITAL YOU ARE ALLOWED ONLY TWO SUPPORT PEOPLE DURING VISITATION HOURS ONLY (10AM -8PM)   The support person(s) may change daily. The support person(s) must pass our screening, gel in and out, and wear a mask at all times, including in the patient's room. Patients must also wear a mask when staff or their support person are in the room.  No visitors under the age of 50. Any visitor under the age of 66 must be accompanied by an adult.        Your procedure is scheduled on: 08/03/21   Report to [facility] Main  Entrance    Report to admitting at: 6:30 AM   Call this number if you have problems the morning of surgery [phone]   Do not eat food :After Midnight.   May have liquids until : 5:45 AM   day of surgery  CLEAR LIQUID DIET  Foods Allowed                                                                     Foods Excluded  Water, Black Coffee and tea, regular and decaf                             liquids that you cannot  Plain Jell-O in any flavor  (No red)                                           see through such as: Fruit ices (not with fruit pulp)                                     milk, soups, orange juice              Iced Popsicles (No red)                                    All solid food                                   Apple juices Sports drinks like Gatorade (No red) Lightly seasoned clear broth or consume(fat free) Sugar  Sample Menu Breakfast                                Lunch                                     Supper Cranberry juice  Beef broth                            Chicken broth Jell-O                                     Grape juice                           Apple juice Coffee or tea                         Jell-O                                      Popsicle                                                Coffee or tea                        Coffee or tea        Oral Hygiene is also important to reduce your risk of infection.                                    Remember - BRUSH YOUR TEETH THE MORNING OF SURGERY WITH YOUR REGULAR TOOTHPASTE   Do NOT smoke after Midnight   Take these medicines the morning of surgery with A SIP OF WATER: Isosorbide,metoprolol.Use inhalers and eye drops as usual.  DO NOT TAKE ANY ORAL DIABETIC MEDICATIONS DAY OF YOUR SURGERY                              You may not have any metal on your body including hair pins, jewelry, and body piercing             Do not wear lotions, powders, perfumes/cologne, or deodorant              Men may shave face and neck.   Do not bring valuables to the hospital. [place].   Contacts, dentures or bridgework may not be worn into surgery.   Bring small overnight bag day of surgery.    Patients discharged the day of surgery will not be allowed to drive home.   Special Instructions: Bring a copy of your healthcare power of attorney and living will documents         the day of surgery if you haven't scanned them in before.              Please read over the following fact sheets you were given: IF YOU HAVE QUESTIONS ABOUT YOUR PRE OP INSTRUCTIONS PLEASE CALL [phone]   [facility] - Preparing for Surgery Before surgery, you can play an important role.  Because skin is not sterile, your skin needs to be as free of germs as  possible.  You can reduce the number of germs on your skin by washing with CHG (chlorahexidine gluconate) soap before surgery.  CHG is an antiseptic cleaner which kills germs and bonds with the skin to continue killing germs even after washing. Please DO NOT use if you have an allergy to CHG or antibacterial soaps.  If your skin becomes  reddened/irritated stop using the CHG and inform your nurse when you arrive at Short Stay. Do not shave (including legs and underarms) for at least 48 hours prior to the first CHG shower.  You may shave your face/neck. Please follow these instructions carefully:  1.  Shower with CHG Soap the night before surgery and the  morning of Surgery.  2.  If you choose to wash your hair, wash your hair first as usual with your  normal  shampoo.  3.  After you shampoo, rinse your hair and body thoroughly to remove the  shampoo.                           4.  Use CHG as you would any other liquid soap.  You can apply chg directly  to the skin and wash                       Gently with a scrungie or clean washcloth.  5.  Apply the CHG Soap to your body ONLY FROM THE NECK DOWN.   Do not use on face/ open                           Wound or open sores. Avoid contact with eyes, ears mouth and genitals (private parts).                       Wash face,  Genitals (private parts) with your normal soap.             6.  Wash thoroughly, paying special attention to the area where your surgery  will be performed.  7.  Thoroughly rinse your body with warm water from the neck down.  8.  DO NOT shower/wash with your normal soap after using and rinsing off  the CHG Soap.                9.  Pat yourself dry with a clean towel.            10.  Wear clean pajamas.            11.  Place clean sheets on your bed the night of your first shower and do not  sleep with pets. Day of Surgery : Do not apply any lotions/deodorants the morning of surgery.  Please wear clean clothes to the hospital/surgery center.  FAILURE TO FOLLOW THESE INSTRUCTIONS MAY RESULT IN THE CANCELLATION OF YOUR SURGERY PATIENT SIGNATURE_________________________________  NURSE SIGNATURE__________________________________  ________________________________________________________________________

## 2021-07-15 NOTE — Progress Notes (Signed)
PERIOPERATIVE PRESCRIPTION FOR IMPLANTED CARDIAC DEVICE PROGRAMMING  Patient Information: Name:  [name]  DOB:  August 10, 1941  MRN:  [id number]    Planned Procedure: Cystoscopy,possible TURP  Surgeon: Dr. Link Snuffer  Date of Procedure:  08/03/21  Cautery will be used.  Position during surgery:     Please send documentation back to:  [facility] (Fax # [phone])  Device Information:  Clinic EP Physician:   Dani Gobble Croioru Device Type:  Defibrillator Manufacturer and Phone #:  [employer]: [phone] Pacemaker Dependent?:  Unknown Date of Last Device Check:  02/16/21 (remote) Normal Device Function?:  Yes.    Electrophysiologist's Recommendations:  Have magnet available. Provide continuous ECG monitoring when magnet is used or reprogramming is to be performed.  Procedure should not interfere with device function.  No device programming or magnet placement needed.  Per Device Clinic Standing Orders, Rodney Curia, RN  1:24 PM 07/15/2021

## 2021-07-16 ENCOUNTER — Encounter (HOSPITAL_COMMUNITY)
Admission: RE | Admit: 2021-07-16 | Discharge: 2021-07-16 | Disposition: A | Discharge status: Home / Self Care | Payer: Medicare Other | Source: Ambulatory Visit | Attending: Urology | Admitting: Urology

## 2021-07-16 ENCOUNTER — Other Ambulatory Visit: Payer: Self-pay

## 2021-07-16 ENCOUNTER — Encounter (HOSPITAL_COMMUNITY): Payer: Self-pay

## 2021-07-16 DIAGNOSIS — Z01812 Encounter for preprocedural laboratory examination: Secondary | ICD-10-CM | POA: Diagnosis not present

## 2021-07-16 LAB — BASIC METABOLIC PANEL
Anion gap: 6 (ref 5–15)
BUN: 15 mg/dL (ref 8–23)
CO2: 26 mmol/L (ref 22–32)
Calcium: 9.5 mg/dL (ref 8.9–10.3)
Chloride: 109 mmol/L (ref 98–111)
Creatinine, Ser: 1.48 mg/dL — ABNORMAL HIGH (ref 0.61–1.24)
GFR, Estimated: 47 mL/min — ABNORMAL LOW (ref >60)
Glucose, Bld: 101 mg/dL — ABNORMAL HIGH (ref 70–99)
Potassium: 3.4 mmol/L — ABNORMAL LOW (ref 3.5–5.1)
Sodium: 141 mmol/L (ref 135–145)

## 2021-07-16 LAB — CBC
HCT: 42 % (ref 39.0–52.0)
Hemoglobin: 13.7 g/dL (ref 13.0–17.0)
MCH: 30.6 pg (ref 26.0–34.0)
MCHC: 32.6 g/dL (ref 30.0–36.0)
MCV: 93.8 fL (ref 80.0–100.0)
Platelets: 135 10*3/uL — ABNORMAL LOW (ref 150–400)
RBC: 4.48 MIL/uL (ref 4.22–5.81)
RDW: 12.8 % (ref 11.5–15.5)
WBC: 4.7 10*3/uL (ref 4.0–10.5)
nRBC: 0 % (ref 0.0–0.2)

## 2021-07-16 NOTE — Progress Notes (Addendum)
COVID Vaccine Completed: Yes Date COVID Vaccine completed: 2021. X 3 COVID vaccine manufacturer: Pfizer    [employer]   PCP - Dr. Lucianne Lei Cardiologist - Dr. Shelva Majestic  Chest x-ray -  EKG - 01/12/21 EPIC Stress Test -  ECHO - 07/08/20 EPIC Cardiac Cath -  Pacemaker/ICD device last checked: 02/16/21  Sleep Study -  CPAP -   Fasting Blood Sugar -  Checks Blood Sugar _____ times a day  Blood Thinner Instructions: Aspirin Instructions: Last Dose:  Anesthesia review: Hx: Ventricular tachycardia,2nd. Degree AV block,MI,CAD,CHF,CKD III,HTN.  Patient denies shortness of breath, fever, cough and chest pain at PAT appointment   Patient verbalized understanding of instructions that were given to them at the PAT appointment. Patient was also instructed that they will need to review over the PAT instructions again at home before surgery.

## 2021-07-19 NOTE — Progress Notes (Addendum)
Cardiology Office Note   Date:  07/21/2021   ID:  [name], DOB 31-Jan-1941, MRN [id number]  PCP:  Lucianne Lei, MD  Cardiologist:  Shelva Majestic, MD EP: Sanda Klein, MD  Chief Complaint  Patient presents with   Pre-op Exam       History of Present Illness: [name] is a 80 y.o. male with a PMH of CAD, chronic combined CHF with recovery of EF from 20-25% in 2012 to 50-55% in 2019, HTN, HLD, SSS s/p PPM placement, more recent VT s/p upgrade to CRT-D device 10/2019, paroxysmal atrial fibrillation, and CKD stage 3 who presents for preop evaluation.  He was last evaluated by cardiology at an outpatient visit with Dr. Claiborne Billings 12/2020 at which time he had rare non-exertional chest pain and some SOB. He was felt to be euvolemic on exam that day and consideration was made to start entresto if Cr remained stable. No medication changes occurred at that visit and he was recommended to follow-up in 6 months. Prior to this he saw Dr. Sallyanne Kuster 11/2020 and had normal device function at that time with noted episodes of brief atrial fibrillation (<1hour each with overall burden <1%). He was recommended for ongoing remote device interrogations every 3 months, though does not appear to have had an interrogation since 11/2020. His last echocardiogram 06/2020 showed EF 40-45%, G1DD, akinesis of inferior wall, mildly reduced RV systolic function, and mild AI; overall unchanged from 10/2019. His last [facility] 10/2019 showed 30% p-mLAD stenosis and 80% D1 stenosis, otherwise normal coronaries.   More recently he has been having trouble with hematuria. With plans for cystoscopy 08/03/21. From a heart perspective he has been doing pretty well. He does not feel limited in his activities by SOB or CP. He has occasional DOE which is unchanged. He is riding his stationary bike for 20 minutes several times per week, as well as walking regularly for up to an hour at a time with hills on his route. He has no  complaints of SOB at rest, palpitations, dizziness, lightheadedness, syncope, orthopnea, PND, or LE edema. He has not had his device interrogated since 11/2020 that I can tell. He isn't sure how to do remote transmissions. Will see if the device clinic can walk him through the process since Lisa/Dr. C are out of the office today/this week.    Past Medical History:  Diagnosis Date   CAD (coronary artery disease) 80% stenosis diag of the LAD, 30% in OM2 branch of LCX in 2009    a. Nonobstructive CAD by cath 11/2011 with the exception of the pre-existing diagonal branch #2 lesion.   Chronic systolic CHF (congestive heart failure) (HCC)    CKD (chronic kidney disease) stage 3, GFR 30-59 ml/min (HCC)    Colon polyp, hyperplastic    History of stress test 06/01/2012   Normal myocardial perfusion study. compared to the previous study there is no significant change. this is a low risk scan   Hypertension    Legally blind    "both eyes"   Myocardial infarction [facility]) 11/22/2011   NICM (nonischemic cardiomyopathy) ([place])    a. Remote hx of dilated NICM with EF ranging 20-45%, including normal EF by echo (55-60%) in 2014.   Peripheral arterial disease ([place])    a. 06/2014: ABI right 0.99, left 1.2, LE dopplers revealing an occluded right posterior tibial. As symptoms were not felt r/t claudication, no further w/u at the time.   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 Requiring discontinuation of BB dose.   Spondylolisthesis    Ventricular bigeminy    Ventricular tachycardia (paroxysmal) ([place]) 04/11/2015    Past Surgical History:  Procedure Laterality Date   BACK SURGERY     BIV UPGRADE N/A 11/08/2019   Procedure: BIV UPGRADE;  Surgeon: Evans Lance, MD;  Location: [place] CV LAB;  Service: Cardiovascular;  Laterality: N/A;   CARDIAC CATHETERIZATION  11/2011   CARDIAC CATHETERIZATION  11/2011   didn't demonstrate high grade  obstructive disease to account for his LV dysfunction.   CATARACT EXTRACTION, BILATERAL  1990's   CYSTOSCOPY     CYSTOSCOPY WITH URETHRAL DILATATION N/A 05/04/2013   Procedure: CYSTOSCOPY WITH URETHRAL DILATATION;  Surgeon: Eustace Moore, MD;  Location: [place] NEURO ORS;  Service: Neurosurgery;  Laterality: N/A;  with insertion of foley catheter   EP study and ablation of VT  7/13   PVC focus mapped to the right coronary cusp of the aorta, limited ablation performed due to proximity of the focus to the right coronary artery   EYE SURGERY     LEFT HEART CATH AND CORONARY ANGIOGRAPHY N/A 11/07/2019   Procedure: LEFT HEART CATH AND CORONARY ANGIOGRAPHY;  Surgeon: Wellington Hampshire, MD;  Location: [place] CV LAB;  Service: Cardiovascular;  Laterality: N/A;   LEFT HEART CATHETERIZATION WITH CORONARY ANGIOGRAM N/A 11/25/2011   Procedure: LEFT HEART CATHETERIZATION WITH CORONARY ANGIOGRAM;  Surgeon: Leonie Man, MD;  [facility] CATH LAB;  Service: Cardiovascular;  Laterality: N/A;   PACEMAKER IMPLANT N/A 07/12/2018   Procedure: PACEMAKER IMPLANT;  Surgeon: Sanda Klein, MD;  Location: [place] CV LAB;  Service: Cardiovascular;  Laterality: N/A;   POSTERIOR FUSION LUMBAR SPINE  1979   ROTATOR CUFF REPAIR  2000's   left   V-TACH ABLATION N/A 06/06/2012   Procedure: V-TACH ABLATION;  Surgeon: Thompson Grayer, MD;  Location: [facility] CATH LAB;  Service: Cardiovascular;  Laterality: N/A;     Current Outpatient Medications  Medication Sig Dispense Refill   albuterol (VENTOLIN HFA) 108 (90 Base) MCG/ACT inhaler Inhale 1-2 puffs into the lungs every 6 (six) hours as needed for wheezing or shortness of breath.     aspirin EC 81 MG tablet Take 1 tablet (81 mg total) by mouth daily. Swallow whole. 90 tablet 3   BREO ELLIPTA 100-25 MCG/INH AEPB Inhale 1 puff into the lungs every morning.     dorzolamide-timolol (COSOPT) 22.3-6.8 MG/ML ophthalmic solution Place 1 drop into both eyes 2 (two) times daily.      fluconazole (DIFLUCAN) 200 MG tablet Take 200 mg by mouth daily.     furosemide (LASIX) 20 MG tablet TAKE 1 TABLET(20 MG) BY MOUTH DAILY 30 tablet 11   HYDROcodone-acetaminophen (NORCO/VICODIN) 5-325 MG tablet Take 1 tablet by mouth every 4 (four) hours as needed. 10 tablet 0   isosorbide mononitrate (IMDUR) 30 MG 24 hr tablet Take 30 mg by mouth daily.     LUMIGAN 0.01 % SOLN Place 1 drop into both eyes at bedtime.   0   metoprolol succinate (TOPROL-XL) 100 MG 24 hr tablet TAKE 1 TABLET(100 MG) BY MOUTH DAILY WITH OR IMMEDIATELY FOLLOWING A MEAL 90 tablet 3   nitroGLYCERIN (NITROSTAT) 0.4 MG SL tablet      RESTASIS 0.05 % ophthalmic emulsion Place 1 drop into both eyes 2 (two) times daily.  11   spironolactone (ALDACTONE) 25 MG  tablet TAKE 1/2 TABLET(12.5 MG) BY MOUTH TWICE DAILY (Patient taking differently: Take 12.5 mg by mouth daily.) 30 tablet 6   No current facility-administered medications for this visit.    Allergies:   Patient has no known allergies.    Social History:  The patient  reports that he quit smoking about 21 years ago. His smoking use included cigarettes. He has never used smokeless tobacco. He reports that he does not drink alcohol and does not use drugs.   Family History:  The patient's family history includes Asthma in his mother; Other in an other family member.    ROS:  Please see the history of present illness.   Otherwise, review of systems are positive for none.   All other systems are reviewed and negative.    PHYSICAL EXAM: VS:  BP 130/80   Pulse 70   Ht '5\' 8"'$  (1.727 m)   Wt 169 lb (76.7 kg)   SpO2 98%   BMI 25.70 kg/m  , BMI Body mass index is 25.7 kg/m. GEN: Well nourished, well developed, in no acute distress HEENT: sclera anicteric Neck: no JVD, carotid bruits, or masses Cardiac: RRR; no murmurs, rubs, or gallops,no edema  Respiratory:  clear to auscultation bilaterally, normal work of breathing GI: soft, nontender, nondistended, + BS MS: no  deformity or atrophy Skin: warm and dry, no rash Neuro:  Strength and sensation are intact Psych: euthymic mood, full affect   EKG:  EKG is ordered today. The ekg ordered today demonstrates sinus with V-paced rhythm, rate 70 bpm   Recent Labs: 07/05/2021: ALT 17 07/16/2021: BUN 15; Creatinine, Ser 1.48; Hemoglobin 13.7; Platelets 135; Potassium 3.4; Sodium 141    Lipid Panel    Component Value Date/Time   CHOL 107 04/13/2019 1123   TRIG 85 04/13/2019 1123   HDL 50 04/13/2019 1123   CHOLHDL 2.1 04/13/2019 1123   CHOLHDL 2.2 01/24/2017 0950   VLDL 8 01/24/2017 0950   LDLCALC 40 04/13/2019 1123      Wt Readings from Last 3 Encounters:  07/21/21 169 lb (76.7 kg)  07/16/21 168 lb (76.2 kg)  07/05/21 169 lb (76.7 kg)      Other studies Reviewed: Additional studies/ records that were reviewed today include:   Echocardiogram 06/2020: 1. Left ventricular ejection fraction, by estimation, is 40 to 45%. The  left ventricle has mildly decreased function. The left ventricle  demonstrates regional wall motion abnormalities (see scoring  diagram/findings for description). Left ventricular  diastolic parameters are consistent with Grade I diastolic dysfunction  (impaired relaxation). Elevated left ventricular end-diastolic pressure.  There is severe akinesis of the left ventricular, entire inferior wall.   2. Right ventricular systolic function is mildly reduced. The right  ventricular size is normal. A AICD wire is visualized. There is normal  pulmonary artery systolic pressure.   3. The mitral valve is abnormal. Trivial mitral valve regurgitation.   4. The aortic valve is tricuspid. Aortic valve regurgitation is mild.  Mild aortic valve sclerosis is present, with no evidence of aortic valve  stenosis.   5. The inferior vena cava is normal in size with greater than 50%  respiratory variability, suggesting right atrial pressure of 3 mmHg.   Comparison(s): 11/07/19 EF 40-45%.    LHC 10/2019: Prox LAD to Mid LAD lesion is 30% stenosed. 1st Diag lesion is 80% stenosed.   1.  Stable 80% stenosis in diagonal branch which was described on previous cardiac catheterization in 2009.  No other obstructive  disease.   2.  Normal left ventricular end-diastolic pressure.  Left ventricular angiography was not performed due to chronic kidney disease.   Recommendations: No culprit is identified for ventricular tachycardia. Proceed with EP management as planned.     ASSESSMENT AND PLAN:   1. CAD: no anginal complaints.  - Continue aspirin and statin - Continue Bblocker and imdur  2. Chronic combined CHF: no volume overload complaints and he appears euvolemic on exam - Continue metoprolol succinate, spironolactone, and laix  3. Paroxysmal atrial fibrillation: <1% burden on previous interrogation. He has been off eliquis since 09/2019 - Continue metoprolol for rate control  4. SSS s/p PPM with upgrade to CRT-P after episode of VT 10/2019: due for device interrogation - Will ask the device clinic to walk him through how to do a remote transmission. Want to ensure device is functioning properly and no significant arrhythmias prior to finalizing preop plan.   5. HTN: BP 130/80 today - Continue metoprolol succinate, imdur, and lasix  6. HLD: LDL 59 06/2021  - Continue dietary/lifestyle modifications to maintain low cholesterol levels  7. CKD stage 3a: Cr 1.48 07/16/21 - Continue routine monitoring  8. Preop assessment: planning for upcoming cystscopy 07/2021 to evaluate recent hematuria. Hgb thankfully stable on multiple checks this month. He has no anginal complaints and can easily complete 4 METs.  - Based on ACC/AHA guidelines, [name] would be at acceptable risk for the planned procedure without further cardiovascular testing.  - Will update his device interrogation as above. If stable, will finalize preop recommendations to send to Dr. Link Snuffer   Current medicines are reviewed at length with the patient today.  The patient does not have concerns regarding medicines.  The following changes have been made:  As above  Labs/ tests ordered today include:   Orders Placed This Encounter  Procedures   EKG 12-Lead     Disposition:   FU with Dr. Sallyanne Kuster 11/2020 for annual follow-up and Dr. Claiborne Billings in 6 months  Signed, Abigail Butts, PA-C  07/21/2021 11:13 AM          Patient Name: [name]  DOB: [birth year] MRN: [id number]  Primary Cardiologist: Shelva Majestic, MD  Chart reviewed as part of pre-operative protocol coverage. Given past medical history and time since last visit, based on ACC/AHA guidelines, [name] would be at acceptable risk for the planned procedure without further cardiovascular testing.   The patient was advised that if he develops new symptoms prior to surgery to contact our office to arrange for a follow-up visit, and he verbalized understanding.  His device interrogation 07/23/21 showed normal function.  I will route this recommendation to the requesting party via Epic fax function.   Abigail Butts, PA-C 07/23/2021, 12:42 PM

## 2021-07-21 ENCOUNTER — Ambulatory Visit (INDEPENDENT_AMBULATORY_CARE_PROVIDER_SITE_OTHER): Payer: Medicare Other | Admitting: Medical

## 2021-07-21 ENCOUNTER — Telehealth: Payer: Self-pay

## 2021-07-21 ENCOUNTER — Other Ambulatory Visit: Payer: Self-pay

## 2021-07-21 ENCOUNTER — Encounter: Payer: Self-pay | Admitting: Medical

## 2021-07-21 VITALS — BP 130/80 | HR 70 | Ht 68.0 in | Wt 169.0 lb

## 2021-07-21 DIAGNOSIS — I472 Ventricular tachycardia: Secondary | ICD-10-CM | POA: Diagnosis not present

## 2021-07-21 DIAGNOSIS — I10 Essential (primary) hypertension: Secondary | ICD-10-CM | POA: Diagnosis not present

## 2021-07-21 DIAGNOSIS — Z01810 Encounter for preprocedural cardiovascular examination: Secondary | ICD-10-CM

## 2021-07-21 DIAGNOSIS — I4729 Other ventricular tachycardia: Secondary | ICD-10-CM

## 2021-07-21 DIAGNOSIS — I495 Sick sinus syndrome: Secondary | ICD-10-CM | POA: Diagnosis not present

## 2021-07-21 DIAGNOSIS — I5042 Chronic combined systolic (congestive) and diastolic (congestive) heart failure: Secondary | ICD-10-CM

## 2021-07-21 DIAGNOSIS — I25118 Atherosclerotic heart disease of native coronary artery with other forms of angina pectoris: Secondary | ICD-10-CM | POA: Diagnosis not present

## 2021-07-21 DIAGNOSIS — Z1152 Encounter for screening for COVID-19: Secondary | ICD-10-CM | POA: Diagnosis not present

## 2021-07-21 DIAGNOSIS — N1831 Chronic kidney disease, stage 3a: Secondary | ICD-10-CM | POA: Diagnosis not present

## 2021-07-21 DIAGNOSIS — E785 Hyperlipidemia, unspecified: Secondary | ICD-10-CM

## 2021-07-21 DIAGNOSIS — I480 Paroxysmal atrial fibrillation: Secondary | ICD-10-CM

## 2021-07-21 NOTE — Telephone Encounter (Signed)
Unsuccessful telephone encounter to patient at both given numbers to assist with device manual transmission to assess for device and rhythm stability prior to upcoming surgery. Unable to leave messages as voice mailbox has not been set up and voice mailbox is full. Of note letters have previously been sent to patient for missed remotes without response. It is documented in [place] that patient has cell phone app as remote transmitter. If we are able to get in touch with patient he needs to be brought into clinic for interrogation and assistance with mobile transmissions if patient has a new phone.

## 2021-07-21 NOTE — Telephone Encounter (Signed)
-----   Message from Abigail Butts, PA-C sent at 07/21/2021 11:13 AM EDT ----- [facility] - can you guys reach out to [name] and walk him through how to do remote transmissions. The last one I see is from 11/2020 in office. (Am I missing one?) Want to make sure everything is functioning appropriately prior to his upcoming procedure.   Dr. Loletha Grayer - will you let me know if anything unexpected shows up on his interrogation and I will finalize his preop recommendations.   Thanks all!

## 2021-07-21 NOTE — Patient Instructions (Addendum)
Medication Instructions:  No Changes *If you need a refill on your cardiac medications before your next appointment, please call your pharmacy*   Lab Work: No Labs If you have labs (blood work) drawn today and your tests are completely normal, you will receive your results only by: [place] (if you have MyChart) OR A paper copy in the mail If you have any lab test that is abnormal or we need to change your treatment, we will call you to review the results.   Testing/Procedures: No Testing   Follow-Up: At [facility], you and your health needs are our priority.  As part of our continuing mission to provide you with exceptional heart care, we have created designated Provider Care Teams.  These Care Teams include your primary Cardiologist (physician) and Advanced Practice Providers (APPs -  Physician Assistants and Nurse Practitioners) who all work together to provide you with the care you need, when you need it.  We recommend signing up for the patient portal called "MyChart".  Sign up information is provided on this After Visit Summary.  MyChart is used to connect with patients for Virtual Visits (Telemedicine).  Patients are able to view lab/test results, encounter notes, upcoming appointments, etc.  Non-urgent messages can be sent to your provider as well.   To learn more about what you can do with MyChart, go to [email or web address].    Your next appointment:   4 month(s)  The format for your next appointment:   In Person  Provider:   Sanda Klein, MD   Other Instructions Schedule patient for 6 months with Dr. Claiborne Billings.Low-Sodium Eating Plan Sodium, which is an element that makes up salt, helps you maintain a healthy balance of fluids in your body. Too much sodium can increase your bloodpressure and cause fluid and waste to be held in your body. Your health care provider or dietitian may recommend following this plan if you have high blood pressure (hypertension),  kidney disease, liver disease, or heart failure. Eating less sodium can help lower your blood pressure, reduce swelling, and protect your heart, liver, andkidneys. What are tips for following this plan? Reading food labels The Nutrition Facts label lists the amount of sodium in one serving of the food. If you eat more than one serving, you must multiply the listed amount of sodium by the number of servings. Choose foods with less than 140 mg of sodium per serving. Avoid foods with 300 mg of sodium or more per serving. Shopping  Look for lower-sodium products, often labeled as "low-sodium" or "no salt added." Always check the sodium content, even if foods are labeled as "unsalted" or "no salt added." Buy fresh foods. Avoid canned foods and pre-made or frozen meals. Avoid canned, cured, or processed meats. Buy breads that have less than 80 mg of sodium per slice.  Cooking  Eat more home-cooked food and less restaurant, buffet, and fast food. Avoid adding salt when cooking. Use salt-free seasonings or herbs instead of table salt or sea salt. Check with your health care provider or pharmacist before using salt substitutes. Cook with plant-based oils, such as canola, sunflower, or olive oil.  Meal planning When eating at a restaurant, ask that your food be prepared with less salt or no salt, if possible. Avoid dishes labeled as brined, pickled, cured, smoked, or made with soy sauce, miso, or teriyaki sauce. Avoid foods that contain MSG (monosodium glutamate). MSG is sometimes added to [place] food, bouillon, and some canned foods. Make meals that  can be grilled, baked, poached, roasted, or steamed. These are generally made with less sodium. General information Most people on this plan should limit their sodium intake to 1,500-2,000 mg (milligrams) of sodium each day. What foods should I eat? Fruits Fresh, frozen, or canned fruit. Fruit juice. Vegetables Fresh or frozen vegetables. "No salt  added" canned vegetables. "No salt added"tomato sauce and paste. Low-sodium or reduced-sodium tomato and vegetable juice. Grains Low-sodium cereals, including oats, puffed wheat and rice, and shredded wheat. Low-sodium crackers. Unsalted rice. Unsalted pasta. Low-sodium bread.Whole-grain breads and whole-grain pasta. Meats and other proteins Fresh or frozen (no salt added) meat, poultry, seafood, and fish. Low-sodium canned tuna and salmon. Unsalted nuts. Dried peas, beans, and lentils withoutadded salt. Unsalted canned beans. Eggs. Unsalted nut butters. Dairy Milk. Soy milk. Cheese that is naturally low in sodium, such as ricotta cheese, fresh mozzarella, or Swiss cheese. Low-sodium or reduced-sodium cheese. Creamcheese. Yogurt. Seasonings and condiments Fresh and dried herbs and spices. Salt-free seasonings. Low-sodium mustard and ketchup. Sodium-free salad dressing. Sodium-free light mayonnaise. Fresh orrefrigerated horseradish. Lemon juice. Vinegar. Other foods Homemade, reduced-sodium, or low-sodium soups. Unsalted popcorn and pretzels.Low-salt or salt-free chips. The items listed above may not be a complete list of foods and beverages you can eat. Contact a dietitian for more information. What foods should I avoid? Vegetables Sauerkraut, pickled vegetables, and relishes. Olives. [place] fries. Onion rings. Regular canned vegetables (not low-sodium or reduced-sodium). Regular canned tomato sauce and paste (not low-sodium or reduced-sodium). Regular tomato and vegetable juice (not low-sodium or reduced-sodium). Frozenvegetables in sauces. Grains Instant hot cereals. Bread stuffing, pancake, and biscuit mixes. Croutons. Seasoned rice or pasta mixes. Noodle soup cups. Boxed or frozen macaroni andcheese. Regular salted crackers. Self-rising flour. Meats and other proteins Meat or fish that is salted, canned, smoked, spiced, or pickled. Precooked or cured meat, such as sausages or meat loaves.  [place] Salines. Ham. Pepperoni. Hot dogs. Corned beef. Chipped beef. Salt pork. Jerky. Pickled herring. Anchovies andsardines. Regular canned tuna. Salted nuts. Dairy Processed cheese and cheese spreads. Hard cheeses. Cheese curds. Blue cheese.Feta cheese. String cheese. Regular cottage cheese. Buttermilk. Canned milk. Fats and oils Salted butter. Regular margarine. Ghee. Bacon fat. Seasonings and condiments Onion salt, garlic salt, seasoned salt, table salt, and sea salt. Canned and packaged gravies. Worcestershire sauce. Tartar sauce. Barbecue sauce. Teriyaki sauce. Soy sauce, including reduced-sodium. Steak sauce. Fish sauce. Oyster sauce. Cocktail sauce. Horseradish that you find on the shelf. Regular ketchup and mustard. Meat flavorings and tenderizers. Bouillon cubes. Hot sauce. Pre-made or packaged marinades. Pre-made or packaged taco seasonings. Relishes.Regular salad dressings. Salsa. Other foods Salted popcorn and pretzels. Corn chips and puffs. Potato and tortilla chips.Canned or dried soups. Pizza. Frozen entrees and pot pies. The items listed above may not be a complete list of foods and beverages you should avoid. Contact a dietitian for more information. Summary Eating less sodium can help lower your blood pressure, reduce swelling, and protect your heart, liver, and kidneys. Most people on this plan should limit their sodium intake to 1,500-2,000 mg (milligrams) of sodium each day. Canned, boxed, and frozen foods are high in sodium. Restaurant foods, fast foods, and pizza are also very high in sodium. You also get sodium by adding salt to food. Try to cook at home, eat more fresh fruits and vegetables, and eat less fast food and canned, processed, or prepared foods. This information is not intended to replace advice given to you by your health care provider. Make sure you discuss any  questions you have with your healthcare provider. Document Revised: 12/14/2019 Document Reviewed:  10/10/2019 Elsevier Patient Education  2022 [employer].

## 2021-07-22 ENCOUNTER — Telehealth: Payer: Self-pay

## 2021-07-22 NOTE — Telephone Encounter (Signed)
I spoke with the patient and advised him to bring his [place] mobile phone with him. He would need to be paired with the device again.

## 2021-07-22 NOTE — Telephone Encounter (Signed)
Successful telephone encounter to patient to schedule device clinic appointment to have PPM interrogated for surgical clearance. Patient is scheduled 07/23/21'@0820'$  in device clinic [place].

## 2021-07-23 ENCOUNTER — Ambulatory Visit (INDEPENDENT_AMBULATORY_CARE_PROVIDER_SITE_OTHER): Payer: Medicare Other

## 2021-07-23 ENCOUNTER — Other Ambulatory Visit: Payer: Self-pay

## 2021-07-23 DIAGNOSIS — I429 Cardiomyopathy, unspecified: Secondary | ICD-10-CM | POA: Diagnosis not present

## 2021-07-23 DIAGNOSIS — I441 Atrioventricular block, second degree: Secondary | ICD-10-CM

## 2021-07-23 DIAGNOSIS — I472 Ventricular tachycardia: Secondary | ICD-10-CM | POA: Diagnosis not present

## 2021-07-23 DIAGNOSIS — I4729 Other ventricular tachycardia: Secondary | ICD-10-CM

## 2021-07-23 LAB — CUP PACEART INCLINIC DEVICE CHECK
Battery Remaining Longevity: 60 mo
Brady Statistic RA Percent Paced: 88 %
Brady Statistic RV Percent Paced: 93 %
Date Time Interrogation Session: 20220901085313
HighPow Impedance: 79.875
Implantable Lead Implant Date: 20190821
Implantable Lead Implant Date: 20201217
Implantable Lead Implant Date: 20201217
Implantable Lead Location: 753858
Implantable Lead Location: 753859
Implantable Lead Location: 753860
Implantable Pulse Generator Implant Date: 20201217
Lead Channel Impedance Value: 375 Ohm
Lead Channel Impedance Value: 512.5 Ohm
Lead Channel Impedance Value: 600 Ohm
Lead Channel Pacing Threshold Amplitude: 0.5 V
Lead Channel Pacing Threshold Amplitude: 0.75 V
Lead Channel Pacing Threshold Amplitude: 1.5 V
Lead Channel Pacing Threshold Amplitude: 1.5 V
Lead Channel Pacing Threshold Pulse Width: 0.4 ms
Lead Channel Pacing Threshold Pulse Width: 0.5 ms
Lead Channel Pacing Threshold Pulse Width: 0.5 ms
Lead Channel Pacing Threshold Pulse Width: 0.5 ms
Lead Channel Sensing Intrinsic Amplitude: 12 mV
Lead Channel Sensing Intrinsic Amplitude: 2.4 mV
Lead Channel Setting Pacing Amplitude: 2 V
Lead Channel Setting Pacing Amplitude: 2 V
Lead Channel Setting Pacing Amplitude: 2.5 V
Lead Channel Setting Pacing Pulse Width: 0.5 ms
Lead Channel Setting Pacing Pulse Width: 0.5 ms
Lead Channel Setting Sensing Sensitivity: 0.5 mV
Pulse Gen Serial Number: 111014567

## 2021-07-23 NOTE — Progress Notes (Signed)
CRT-D device check in office. Thresholds and P wave sensing consistent with previous device measurements. Lead impedance trends stable over time. 31 AMS episodes, Longest episode ~8 hours with peak A/V rates of 569/116. Overall AF Burden <1%, patient has known history of PAF, not on [place] since 09/2019 due to low AF burden.  4 HVR episodes recorded, appear NSVT with longest episode lasting 14 beats with peak V rate of 172. Patient bi-ventricularly pacing 93% of the time. Device programmed with appropriate safety margins. Heart failure diagnostics reviewed and trends are stable for patient. No changes made this session. Estimated longevity 5 years.  With assistance from [place], Patient re-enrolled in remote follow up, next scheduled check on 10/22/21. Patient education completed including shock plan.

## 2021-07-28 NOTE — Progress Notes (Addendum)
Anesthesia Chart Review   Case: [id number] Date/Time: 08/03/21 0830   Procedure: CYSTOSCOPY WITH BLADDER BIOPSY POSSIBLE TRANSURETHERAL RESECTION OF BLADDER TUMOR BILATERAL RETROGRADE PYELOGRAM (Bilateral) - REQUESTING 62 MINS   Anesthesia type: General   Pre-op diagnosis: BLADDER LESION   Location: [place] [facility]   Surgeons: Lucas Mallow, MD       DISCUSSION:80 y.o. former smoker with h/o HTN, CAD, CHF s/p PPM placement, atrial fibrillation, CKD Stage III, bladder lesion scheduled for above procedure 08/03/2021 with Dr. Link Snuffer.   Pt last seen by cardiology 07/21/2021. Per OV note, "Preop assessment: planning for upcoming cystscopy 07/2021 to evaluate recent hematuria. Hgb thankfully stable on multiple checks this month. He has no anginal complaints and can easily complete 4 METs.  - Based on ACC/AHA guidelines, FONZIE ASARE would be at acceptable risk for the planned procedure without further cardiovascular testing.  - Will update his device interrogation as above. If stable, will finalize preop recommendations to send to Dr. Link Snuffer"  Discussed with cardio, interrogation stable, ok to proceed.   Device orders requested.  VS: BP (!) 147/84   Pulse 72   Temp 36.6 C (Oral)   Ht '5\' 8"'$  (1.727 m)   Wt 76.2 kg   SpO2 100%   BMI 25.54 kg/m   PROVIDERS: Lucianne Lei, MD is PCP   Cardiologist:  Shelva Majestic, MD EP: Sanda Klein, MD  LABS: Labs reviewed: Acceptable for surgery. (all labs ordered are listed, but only abnormal results are displayed)  Labs Reviewed  CBC - Abnormal; Notable for the following components:      Result Value   Platelets 135 (*)    All other components within normal limits  BASIC METABOLIC PANEL - Abnormal; Notable for the following components:   Potassium 3.4 (*)    Glucose, Bld 101 (*)    Creatinine, Ser 1.48 (*)    GFR, Estimated 47 (*)    All other components within normal limits     IMAGES:   EKG: 07/21/2021 Rate  70 bpm    CV: Echo 07/08/2020   1. Left ventricular ejection fraction, by estimation, is 40 to 45%. The  left ventricle has mildly decreased function. The left ventricle  demonstrates regional wall motion abnormalities (see scoring  diagram/findings for description). Left ventricular  diastolic parameters are consistent with Grade I diastolic dysfunction  (impaired relaxation). Elevated left ventricular end-diastolic pressure.  There is severe akinesis of the left ventricular, entire inferior wall.   2. Right ventricular systolic function is mildly reduced. The right  ventricular size is normal. A AICD wire is visualized. There is normal  pulmonary artery systolic pressure.   3. The mitral valve is abnormal. Trivial mitral valve regurgitation.   4. The aortic valve is tricuspid. Aortic valve regurgitation is mild.  Mild aortic valve sclerosis is present, with no evidence of aortic valve  stenosis.   5. The inferior vena cava is normal in size with greater than 50%  respiratory variability, suggesting right atrial pressure of 3 mmHg.  Cardiac Cath 11/07/2019   Prox LAD to Mid LAD lesion is 30% stenosed. 1st Diag lesion is 80% stenosed.   1.  Stable 80% stenosis in diagonal branch which was described on previous cardiac catheterization in 2009.  No other obstructive disease.   2.  Normal left ventricular end-diastolic pressure.  Left ventricular angiography was not performed due to chronic kidney disease.   Recommendations: No culprit is identified for ventricular  tachycardia. Proceed with EP management as planned.  Past Medical History:  Diagnosis Date   CAD (coronary artery disease) 80% stenosis diag of the LAD, 30% in OM2 branch of LCX in 2009    a. Nonobstructive CAD by cath 11/2011 with the exception of the pre-existing diagonal branch #2 lesion.   Chronic systolic CHF (congestive heart failure) (HCC)    CKD (chronic kidney disease) stage 3, GFR 30-59 ml/min (HCC)    Colon  polyp, hyperplastic    History of stress test 06/01/2012   Normal myocardial perfusion study. compared to the previous study there is no significant change. this is a low risk scan   Hypertension    Legally blind    "both eyes"   Myocardial infarction [facility]) 11/22/2011   NICM (nonischemic cardiomyopathy) ([place])    a. Remote hx of dilated NICM with EF ranging 20-45%, including normal EF by echo (55-60%) in 2014.   Peripheral arterial disease ([place])    a. 06/2014: ABI right 0.99, left 1.2, LE dopplers revealing an occluded right posterior tibial. As symptoms were not felt r/t claudication, no further w/u at the time.   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 Requiring discontinuation of BB dose.   Spondylolisthesis    Ventricular bigeminy    Ventricular tachycardia (paroxysmal) ([place]) 04/11/2015    Past Surgical History:  Procedure Laterality Date   BACK SURGERY     BIV UPGRADE N/A 11/08/2019   Procedure: BIV UPGRADE;  Surgeon: Evans Lance, MD;  Location: [place] CV LAB;  Service: Cardiovascular;  Laterality: N/A;   CARDIAC CATHETERIZATION  11/2011   CARDIAC CATHETERIZATION  11/2011   didn't demonstrate high grade obstructive disease to account for his LV dysfunction.   CATARACT EXTRACTION, BILATERAL  1990's   CYSTOSCOPY     CYSTOSCOPY WITH URETHRAL DILATATION N/A 05/04/2013   Procedure: CYSTOSCOPY WITH URETHRAL DILATATION;  Surgeon: Eustace Moore, MD;  Location: [place] NEURO ORS;  Service: Neurosurgery;  Laterality: N/A;  with insertion of foley catheter   EP study and ablation of VT  7/13   PVC focus mapped to the right coronary cusp of the aorta, limited ablation performed due to proximity of the focus to the right coronary artery   EYE SURGERY     LEFT HEART CATH AND CORONARY ANGIOGRAPHY N/A 11/07/2019   Procedure: LEFT HEART CATH AND CORONARY ANGIOGRAPHY;  Surgeon: Wellington Hampshire, MD;  Location: [place] CV  LAB;  Service: Cardiovascular;  Laterality: N/A;   LEFT HEART CATHETERIZATION WITH CORONARY ANGIOGRAM N/A 11/25/2011   Procedure: LEFT HEART CATHETERIZATION WITH CORONARY ANGIOGRAM;  Surgeon: Leonie Man, MD;  Location[facility] C[facility] LAB;  Service: Cardiovascular;  Laterality: N/A;   PACEMAKER IMPLANT N/A 07/12/2018   Procedure: PACEMAKER IMPLANT;  Surgeon: Sanda Klein, MD;  Location: [place] CV LAB;  Service: Cardiovascular;  Laterality: N/A;   POSTERIOR FUSION LUMBAR SPINE  1979   ROTATOR CUFF REPAIR  2000's   left   V-TACH ABLATION N/A 06/06/2012   Procedure: V-TACH ABLATION;  Surgeon: Thompson Grayer, MD;  Location: [facility] CATH LAB;  Service: Cardiovascular;  Laterality: N/A;    MEDICATIONS:  albuterol (VENTOLIN HFA) 108 (90 Base) MCG/ACT inhaler   aspirin EC 81 MG tablet   BREO ELLIPTA 100-25 MCG/INH AEPB   dorzolamide-timolol (COSOPT) 22.3-6.8 MG/ML ophthalmic solution   fluconazole (DIFLUCAN) 200 MG tablet   furosemide (LASIX) 20 MG tablet  HYDROcodone-acetaminophen (NORCO/VICODIN) 5-325 MG tablet   isosorbide mononitrate (IMDUR) 30 MG 24 hr tablet   LUMIGAN 0.01 % SOLN   metoprolol succinate (TOPROL-XL) 100 MG 24 hr tablet   nitroGLYCERIN (NITROSTAT) 0.4 MG SL tablet   RESTASIS 0.05 % ophthalmic emulsion   spironolactone (ALDACTONE) 25 MG tablet   No current facility-administered medications for this encounter.     Konrad Felix Ward, PA-C [facility] Pre-Surgical Testing [phone]

## 2021-07-30 ENCOUNTER — Encounter: Payer: Self-pay | Admitting: Cardiovascular Disease

## 2021-07-30 NOTE — Anesthesia Preprocedure Evaluation (Addendum)
Anesthesia Evaluation  Patient identified by MRN, date of birth, ID band Patient awake    Reviewed: Allergy & Precautions, NPO status , Patient's Chart, lab work & pertinent test results  Airway Mallampati: I  TM Distance: >3 FB Neck ROM: Full    Dental no notable dental hx. (+) Edentulous Upper, Edentulous Lower   Pulmonary COPD,  COPD inhaler, former smoker,    Pulmonary exam normal breath sounds clear to auscultation       Cardiovascular hypertension, Pt. on medications and Pt. 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 Left ventricular ejection fraction, by estimation, is 40 to 45%. The  left ventricle has mildly decreased function. The left ventricle  demonstrates regional wall motion abnormalities (see scoring  diagram/findings for description). Left ventricular  diastolic parameters are consistent with Grade I diastolic dysfunction  (impaired relaxation). Elevated left ventricular end-diastolic pressure.  There is severe akinesis of the left ventricular, entire inferior wall.  2. Right ventricular systolic function is mildly reduced. The right  ventricular size is normal. A AICD wire is visualized. There is normal  pulmonary artery systolic pressure.  3. The mitral valve is abnormal. Trivial mitral valve regurgitation.  4. The aortic valve is tricuspid. Aortic valve regurgitation is mild.  Mild aortic valve sclerosis is present, with no evidence of aortic valve  stenosis.  5. The inferior vena cava is normal in size with greater than 50%  respiratory variability, suggesting right atrial pressure of 3 mmHg.    Neuro/Psych    GI/Hepatic   Endo/Other    Renal/GU Renal InsufficiencyRenal diseaseLab Results      Component                Value                Date                      CREATININE               1.48 (H)            07/16/2021                BUN                      15                  07/16/2021                NA                       141                 07/16/2021                K                        3.4 (L)             07/16/2021                CL                       109  07/16/2021                CO2                      26                  07/16/2021                Musculoskeletal   Abdominal   Peds  Hematology Lab Results      Component                Value               Date                      WBC                      4.7                 07/16/2021                HGB                      13.7                07/16/2021                HCT                      42.0                07/16/2021                MCV                      93.8                07/16/2021                PLT                      135 (L)             07/16/2021              Anesthesia Other Findings   Reproductive/Obstetrics                           Anesthesia Physical Anesthesia Plan  ASA: 3  Anesthesia Plan: General   Post-op Pain Management:    Induction: Intravenous  PONV Risk Score and Plan: 3 and Treatment may vary due to age or medical condition and Ondansetron  Airway Management Planned: LMA and Oral ETT  Additional Equipment: None  Intra-op Plan:   Post-operative Plan:   Informed Consent: I have reviewed the patients History and Physical, chart, labs and discussed the procedure including the risks, benefits and alternatives for the proposed anesthesia with the patient or authorized representative who has indicated his/her understanding and acceptance.     Dental advisory given  Plan Discussed with: CRNA and Anesthesiologist  Anesthesia Plan Comments: (See PAT note 07/16/2021, Konrad Felix Ward, PA-C  LMA  ETT)      Anesthesia Quick Evaluation

## 2021-07-30 NOTE — Progress Notes (Signed)
[place] DEVICE PROGRAMMING  Patient Information: Name:  NAHZIR SCHULT  DOB:  1941/07/30  MRN:  [id number]    Planned Procedure:  cystoscopy  Surgeon:  Dr. Gloriann Loan  Date of Procedure:  08/03/2021  Cautery will be used.  Position during surgery:  unknown   Please send documentation back to:  [facility] (Fax # [phone])  Device Information:  Clinic EP Physician:  Dr. Dani Gobble Croitoru   Device Type:  Defibrillator Manufacturer and Phone #:  [employer]: [phone] Pacemaker Dependent?:  Yes.   Date of Last Device Check:  07/23/21 Normal Device Function?:  Yes.    Electrophysiologist's Recommendations:  Have magnet available. Provide continuous ECG monitoring when magnet is used or reprogramming is to be performed.  Procedure may interfere with device function.  Magnet should be placed over device during procedure.  Per Device Clinic Standing Orders, Simone Curia, RN  4:31 PM 07/30/2021

## 2021-08-03 ENCOUNTER — Ambulatory Visit (HOSPITAL_COMMUNITY): Payer: Medicare Other | Admitting: Physician Assistant

## 2021-08-03 ENCOUNTER — Ambulatory Visit (HOSPITAL_COMMUNITY): Payer: Medicare Other | Admitting: Anesthesiology

## 2021-08-03 ENCOUNTER — Encounter (HOSPITAL_COMMUNITY)
Admission: RE | Disposition: A | Discharge status: Home / Self Care | Payer: Self-pay | Source: Home / Self Care | Attending: Urology

## 2021-08-03 ENCOUNTER — Ambulatory Visit (HOSPITAL_COMMUNITY)
Admission: RE | Admit: 2021-08-03 | Discharge: 2021-08-03 | Disposition: A | Discharge status: Home / Self Care | Payer: Medicare Other | Attending: Urology | Admitting: Urology

## 2021-08-03 ENCOUNTER — Encounter (HOSPITAL_COMMUNITY): Payer: Self-pay | Admitting: Urology

## 2021-08-03 ENCOUNTER — Ambulatory Visit (HOSPITAL_COMMUNITY): Payer: Medicare Other

## 2021-08-03 DIAGNOSIS — R310 Gross hematuria: Secondary | ICD-10-CM | POA: Insufficient documentation

## 2021-08-03 DIAGNOSIS — E785 Hyperlipidemia, unspecified: Secondary | ICD-10-CM | POA: Diagnosis not present

## 2021-08-03 DIAGNOSIS — Z79899 Other long term (current) drug therapy: Secondary | ICD-10-CM | POA: Diagnosis not present

## 2021-08-03 DIAGNOSIS — N200 Calculus of kidney: Secondary | ICD-10-CM | POA: Diagnosis not present

## 2021-08-03 DIAGNOSIS — Z7951 Long term (current) use of inhaled steroids: Secondary | ICD-10-CM | POA: Diagnosis not present

## 2021-08-03 DIAGNOSIS — N402 Nodular prostate without lower urinary tract symptoms: Secondary | ICD-10-CM | POA: Insufficient documentation

## 2021-08-03 DIAGNOSIS — Z79891 Long term (current) use of opiate analgesic: Secondary | ICD-10-CM | POA: Diagnosis not present

## 2021-08-03 DIAGNOSIS — I480 Paroxysmal atrial fibrillation: Secondary | ICD-10-CM | POA: Diagnosis not present

## 2021-08-03 DIAGNOSIS — D303 Benign neoplasm of bladder: Secondary | ICD-10-CM | POA: Diagnosis not present

## 2021-08-03 DIAGNOSIS — K219 Gastro-esophageal reflux disease without esophagitis: Secondary | ICD-10-CM | POA: Diagnosis not present

## 2021-08-03 DIAGNOSIS — N329 Bladder disorder, unspecified: Secondary | ICD-10-CM | POA: Diagnosis not present

## 2021-08-03 HISTORY — DX: Presence of automatic (implantable) cardiac defibrillator: Z95.810

## 2021-08-03 HISTORY — PX: CYSTOSCOPY WITH BIOPSY: SHX5122

## 2021-08-03 SURGERY — CYSTOSCOPY, WITH BIOPSY
Anesthesia: General | Laterality: Bilateral

## 2021-08-03 MED ORDER — FENTANYL CITRATE (PF) 100 MCG/2ML IJ SOLN
INTRAMUSCULAR | Status: DC | PRN
  Administered 2021-08-03 (×4): 25 ug via INTRAVENOUS

## 2021-08-03 MED ORDER — LACTATED RINGERS IV SOLN: INTRAVENOUS | Status: DC

## 2021-08-03 MED ORDER — DEXAMETHASONE SODIUM PHOSPHATE 4 MG/ML IJ SOLN
INTRAMUSCULAR | Status: DC | PRN
  Administered 2021-08-03: 5 mg via INTRAVENOUS

## 2021-08-03 MED ORDER — DEXAMETHASONE SODIUM PHOSPHATE 10 MG/ML IJ SOLN
INTRAMUSCULAR | Status: AC
  Filled 2021-08-03: qty 1

## 2021-08-03 MED ORDER — PROPOFOL 10 MG/ML IV BOLUS
INTRAVENOUS | Status: AC
  Filled 2021-08-03: qty 20

## 2021-08-03 MED ORDER — PHENYLEPHRINE HCL (PRESSORS) 10 MG/ML IV SOLN
INTRAVENOUS | Status: DC | PRN
  Administered 2021-08-03 (×2): 40 ug via INTRAVENOUS

## 2021-08-03 MED ORDER — IOHEXOL 300 MG/ML  SOLN
INTRAMUSCULAR | Status: DC | PRN
  Administered 2021-08-03: 20 mL

## 2021-08-03 MED ORDER — FENTANYL CITRATE PF 50 MCG/ML IJ SOSY: 25.0000 ug | PREFILLED_SYRINGE | INTRAMUSCULAR | Status: DC | PRN

## 2021-08-03 MED ORDER — ONDANSETRON HCL 4 MG/2ML IJ SOLN: 4.0000 mg | Freq: Once | INTRAMUSCULAR | Status: DC | PRN

## 2021-08-03 MED ORDER — ACETAMINOPHEN 10 MG/ML IV SOLN: 1000.0000 mg | Freq: Once | INTRAVENOUS | Status: DC | PRN

## 2021-08-03 MED ORDER — PHENYLEPHRINE 40 MCG/ML (10ML) SYRINGE FOR IV PUSH (FOR BLOOD PRESSURE SUPPORT)
PREFILLED_SYRINGE | INTRAVENOUS | Status: AC
  Filled 2021-08-03: qty 10

## 2021-08-03 MED ORDER — ONDANSETRON HCL 4 MG/2ML IJ SOLN
INTRAMUSCULAR | Status: DC | PRN
  Administered 2021-08-03: 4 mg via INTRAVENOUS

## 2021-08-03 MED ORDER — ONDANSETRON HCL 4 MG/2ML IJ SOLN
INTRAMUSCULAR | Status: AC
  Filled 2021-08-03: qty 2

## 2021-08-03 MED ORDER — STERILE WATER FOR IRRIGATION IR SOLN
Status: DC | PRN
  Administered 2021-08-03: 3000 mL

## 2021-08-03 MED ORDER — FENTANYL CITRATE (PF) 100 MCG/2ML IJ SOLN
INTRAMUSCULAR | Status: AC
  Filled 2021-08-03: qty 2

## 2021-08-03 MED ORDER — ORAL CARE MOUTH RINSE: 15.0000 mL | Freq: Once | OROMUCOSAL | Status: AC

## 2021-08-03 MED ORDER — CEFAZOLIN SODIUM-DEXTROSE 2-4 GM/100ML-% IV SOLN
2.0000 g | INTRAVENOUS | Status: AC
  Administered 2021-08-03: 2 g via INTRAVENOUS
  Filled 2021-08-03: qty 100

## 2021-08-03 MED ORDER — LIDOCAINE 2% (20 MG/ML) 5 ML SYRINGE
INTRAMUSCULAR | Status: AC
  Filled 2021-08-03: qty 5

## 2021-08-03 MED ORDER — CHLORHEXIDINE GLUCONATE 0.12 % MT SOLN
15.0000 mL | Freq: Once | OROMUCOSAL | Status: AC
  Administered 2021-08-03: 15 mL via OROMUCOSAL

## 2021-08-03 MED ORDER — PROPOFOL 10 MG/ML IV BOLUS
INTRAVENOUS | Status: DC | PRN
  Administered 2021-08-03: 100 mg via INTRAVENOUS

## 2021-08-03 MED ORDER — LIDOCAINE HCL (CARDIAC) PF 100 MG/5ML IV SOSY
PREFILLED_SYRINGE | INTRAVENOUS | Status: DC | PRN
  Administered 2021-08-03: 60 mg via INTRAVENOUS

## 2021-08-03 SURGICAL SUPPLY — 18 items
BAG DRN RND TRDRP ANRFLXCHMBR (UROLOGICAL SUPPLIES)
BAG URINE DRAIN 2000ML AR STRL (UROLOGICAL SUPPLIES) IMPLANT
BAG URO CATCHER STRL LF (MISCELLANEOUS) ×2 IMPLANT
CATH FOLEY 2WAY SLVR  5CC 18FR (CATHETERS)
CATH FOLEY 2WAY SLVR 5CC 18FR (CATHETERS) IMPLANT
DRAPE FOOT SWITCH (DRAPES) ×2 IMPLANT
ELECT REM PT RETURN 15FT ADLT (MISCELLANEOUS) ×2 IMPLANT
GLOVE SURG ENC MOIS LTX SZ7.5 (GLOVE) ×2 IMPLANT
GOWN STRL REUS W/TWL XL LVL3 (GOWN DISPOSABLE) ×2 IMPLANT
KIT TURNOVER KIT A (KITS) ×2 IMPLANT
LOOP CUT BIPOLAR 24F LRG (ELECTROSURGICAL) IMPLANT
MANIFOLD NEPTUNE II (INSTRUMENTS) ×2 IMPLANT
PACK CYSTO (CUSTOM PROCEDURE TRAY) ×2 IMPLANT
PLUG CATH AND CAP STER (CATHETERS) IMPLANT
SYR TOOMEY IRRIG 70ML (MISCELLANEOUS)
SYRINGE TOOMEY IRRIG 70ML (MISCELLANEOUS) IMPLANT
TUBING CONNECTING 10 (TUBING) ×2 IMPLANT
TUBING UROLOGY SET (TUBING) ×2 IMPLANT

## 2021-08-03 NOTE — Discharge Instructions (Addendum)

## 2021-08-03 NOTE — Anesthesia Postprocedure Evaluation (Signed)
Anesthesia Post Note  Patient: [name]  Procedure(s) Performed: CYSTOSCOPY WITH BLADDER BIOPSY, BILATERAL RETROGRADE PYELOGRAM (Bilateral)     Patient location during evaluation: PACU Anesthesia Type: General Level of consciousness: awake and alert Pain management: pain level controlled Vital Signs Assessment: post-procedure vital signs reviewed and stable Respiratory status: spontaneous breathing, nonlabored ventilation, respiratory function stable and patient connected to nasal cannula oxygen Cardiovascular status: blood pressure returned to baseline and stable Postop Assessment: no apparent nausea or vomiting Anesthetic complications: no   No notable events documented.  Last Vitals:  Vitals:   08/03/21 1015 08/03/21 1030  BP: (!) 138/93 (!) 155/96  Pulse: 69 70  Resp: 14 14  Temp: 36.4 C   SpO2: 97% 99%    Last Pain:  Vitals:   08/03/21 1015  TempSrc:   PainSc: 0-No pain                 Barnet Glasgow

## 2021-08-03 NOTE — H&P (Addendum)
CC/HPI: CC: Gross hematuria  HPI:  05/23/2020  Patient went to the emergency department on 04/19/2020 for gross hematuria. He underwent a CT scan of the abdomen and pelvis that revealed cyst that were unable to be completely characterized as well as bilateral nonobstructing renal calculi. There was no hydronephrosis. The scan was without contrast. He denies any further gross hematuria. Urinalysis is negative for microscopic hematuria today. He denies any voiding complaints.   07/01/2020  Patient underwent a CT scan of the abdomen and pelvis, CT IVP. This revealed bilateral small renal calculi. He also had some simple renal cyst. No evidence of genitourinary malignancy. His PSA was normal but he does have a significantly nodular prostate. He was also found to have a lung nodule. He underwent a dedicated CT scan of the chest that confirmed this.   Interval: The patient was scheduled for prostate biopsy because of his prostate nodularity. His PSA at that time was 0.34. The patient underwent a prostate biopsy 1 week ago. 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 The patient tolerated the biopsy well without any ongoing bleeding. He has no worsening urinary tract symptoms. He does occasionally have urinary frequency, but takes Lasix. He feels if he empties his bladder well, his urinary tract symptoms are at his baseline.   07/10/2021  Patient had an episode of gross hematuria again. He underwent CT renal stone protocol that revealed some right-sided renal calculi measuring up to 8 mm in size on the right. Tiny left-sided renal calculi. No ureteral calculi. No obvious bladder abnormalities. As mentioned above, he has a history of nodular prostate and biopsy was normal. He denies any pain or discomfort.     ALLERGIES: None   MEDICATIONS: Aspirin  Metoprolol Succinate 100 mg tablet, extended release 24 hr  Albuterol Sulfate  Breo Ellipta  Furosemide 20  mg tablet  Hydrocodone-Acetaminophen  Isosorbide Mononitrate  Lumigan  Restasis     GU PSH: Cysto Dilate Stricture (M or F) - 2014, 2012 Cystoscopy - 07/01/2020 Locm 300-'399Mg'$ /Ml Iodine,1Ml - 06/04/2020 Prostate Needle Biopsy - 08/20/2020       PSH Notes: Cystoscopy For Urethral Stricture, Cystoscopy For Urethral Stricture   NON-GU PSH: Surgical Pathology, Gross And Microscopic Examination For Prostate Needle - 08/20/2020     GU PMH: Prostate nodule w/o LUTS - 08/29/2020, - 08/20/2020, - 05/23/2020 Gross hematuria - 05/23/2020 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  Gastrointestinal (Upper):   Patient denies nausea, vomiting, and indigestion/ heartburn.  Gastrointestinal (Lower):   Patient denies diarrhea and constipation.  Constitutional:   Patient denies fever, night sweats, weight loss, and fatigue.  Skin:   Patient denies skin rash/ lesion and itching.  Eyes:   Patient denies blurred vision and double vision.  Ears/ Nose/ Throat:   Patient denies sore throat and sinus problems.  Hematologic/Lymphatic:   Patient denies easy bruising and swollen glands.  Cardiovascular:  Patient denies leg swelling and chest pains.  Respiratory:   Patient denies cough and shortness of breath.  Endocrine:   Patient denies excessive thirst.  Musculoskeletal:   Patient denies back pain and joint pain.  Neurological:   Patient denies headaches and dizziness.   Psychologic:   Patient denies depression and anxiety.   VITAL SIGNS: None   GU PHYSICAL EXAMINATION:    Testes: 30 g, somewhat nodular, unchanged from prior  Penis: Penis uncircumcised. No foreskin warts, no cracks. No dorsal peyronie's plaques, no left corporal peyronie's plaques, no right corporal peyronie's plaques, no scarring, no shaft warts. No balanitis, no meatal stenosis.    MULTI-SYSTEM PHYSICAL EXAMINATION:       Complexity of Data:  Source Of History:  Patient  Records Review:   Pathology Reports, Previous Doctor Records, Previous Patient Records  Urine Test Review:   Urinalysis  X-Ray Review: C.T. Abdomen/Pelvis: Reviewed Films. Reviewed Report. Discussed With Patient.     05/23/20  PSA  Total PSA 0.34 ng/mL    PROCEDURES:         Flexible Cystoscopy - 52000  Risks, benefits, and some of the potential complications of the procedure were discussed at length with the patient including infection, bleeding, voiding discomfort, urinary retention, fever, chills, sepsis, and others. All questions were answered. Informed consent was obtained. Antibiotic prophylaxis was given. Sterile technique and intraurethral analgesia were used.  Meatus:  Normal size. Normal location. Normal condition.  Urethra:  No strictures.  External Sphincter:  Normal.  Verumontanum:  Normal.  Prostate:  Obstructing. Moderate hyperplasia. small median lobe  Bladder Neck:  Non-obstructing.  Ureteral Orifices:  Normal location. Normal size. Normal shape.   Bladder:  No trabeculation. No stones. On the left lateral wall, he did have 2-3 small areas of slightly raised erythema could not rule out early bladder tumor or CIS. Total area was about 1 cm in size.      The lower urinary tract was carefully examined. The procedure was well-tolerated and without complications. Antibiotic instructions were given. Instructions were given to call the office immediately for bloody urine, difficulty urinating, urinary  retention, painful or frequent urination, fever, chills, nausea, vomiting or other illness. The patient stated that he understood these instructions and would comply with them.         Urinalysis w/Scope Dipstick Dipstick Cont'd Micro  Color: Yellow Bilirubin: Neg mg/dL WBC/hpf: 10 - 20/hpf  Appearance: Cloudy Ketones: Neg mg/dL RBC/hpf: 10 - 20/hpf  Specific Gravity: 1.025 Blood: 3+ ery/uL Bacteria: Many (>50/hpf)  pH: 6.0 Protein: Neg mg/dL Cystals: NS (Not Seen)  Glucose: Neg mg/dL Urobilinogen: 0.2 mg/dL Casts: NS (Not Seen)    Nitrites: Positive Trichomonas: Not Present    Leukocyte Esterase: 2+ leu/uL Mucous: Not Present      Epithelial Cells: 6 - 10/hpf      Yeast: NS (Not Seen)      Sperm: Not Present    ASSESSMENT:      ICD-10 Details  1 GU:   Gross hematuria - R31.0 Chronic, Stable  2   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 Risks and benefits discussed. His cardiologist is Dr. Claiborne Billings and he sees him next week. We will make sure no further testing is necessary. He does admit to occasional chest pain that requires nitroglycerin.   cc: Dr Criss Rosales    Signed by Link Snuffer, III, M.D. on 07/10/21 at 9:15 AM (EDT

## 2021-08-03 NOTE — Progress Notes (Signed)
Covering for Dr. Claiborne Billings. Low risk procedure (cystoscopy). Multiple cardiac issues including CAD, history of ventricular tachycardia and recently diagnosed paroxysmal atrial fibrillation, but no recent acute issues. No plan for additional cardiovascular work-up before cystoscopy.  The procedure should not interfere with a normal function of his biventricular pacemaker-defibrillator. ([employer], last remote check July 23, 2021, normal function.  Not pacemaker dependent). No plan to start anticoagulation for atrial fibrillation until after the cystoscopy and hematuria has resolved.  Sanda Klein, MD, [facility] [phone] office [phone] pager

## 2021-08-03 NOTE — Op Note (Signed)
Operative Note  Preoperative diagnosis:  1.  Gross hematuria  Postoperative diagnosis: 1.  Gross hematuria  Procedure(s): 1.  Cystoscopy with bladder biopsy and fulguration 2.  Bilateral retrograde pyelogram  Surgeon: Link Snuffer, MD  Assistants: None  Anesthesia: General  Complications: None immediate  EBL: Minimal  Specimens: 1.  Bladder biopsy x2  Drains/Catheters: 1.  None  Intraoperative findings: 1.  Normal anterior urethra 2.  Borderline obstructing prostate 3.  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  This was biopsied x2 and fulgurated.  No obvious bladder tumors.  Bilateral retrograde pyelograms without any obvious filling defect to represent tumor.  There was a small filling defect on the left that looked more like an air bubble  Indication: 80 year old male with gross hematuria presents for the previously mentioned operation.  Description of procedure:  The patient was identified and consent was obtained.  The patient was taken to the operating room and placed in the supine position.  The patient was placed under general anesthesia.  Perioperative antibiotics were administered.  The patient was placed in dorsal lithotomy.  Patient was prepped and draped in a standard sterile fashion and a timeout was performed.  A 21 French rigid cystoscope was advanced into the urethra and into the bladder.  Complete cystoscopy was performed with the findings noted above.  Bilateral retrograde pyelogram was performed with an open-ended ureteral catheter with the findings noted above.  Bladder biopsy x2 was taken and collected for specimen followed by fulguration of the biopsy beds.  Bladder was inspected and there was no obvious bleeding.  No obvious perforation.  Bladder was drained and scope was withdrawn.  Patient tolerated the procedure well and was stable postoperative.  Plan: Follow-up in 1 week for pathology review

## 2021-08-03 NOTE — Interval H&P Note (Signed)
History and Physical Interval Note:  08/03/2021 7:44 AM  [name]  has presented today for surgery, with the diagnosis of BLADDER LESION.  The various methods of treatment have been discussed with the patient and family. After consideration of risks, benefits and other options for treatment, the patient has consented to  Procedure(s) with comments: CYSTOSCOPY WITH BLADDER BIOPSY POSSIBLE TRANSURETHERAL RESECTION OF BLADDER TUMOR BILATERAL RETROGRADE PYELOGRAM (Bilateral) - REQUESTING 81 MINS as a surgical intervention.  The patient's history has been reviewed, patient examined, no change in status, stable for surgery.  I have reviewed the patient's chart and labs.  Questions were answered to the patient's satisfaction.     Marton Redwood, III

## 2021-08-03 NOTE — Transfer of Care (Signed)
Immediate Anesthesia Transfer of Care Note  Patient: [name]  Procedure(s) Performed: Procedure(s) (LRB): CYSTOSCOPY WITH BLADDER BIOPSY, BILATERAL RETROGRADE PYELOGRAM (Bilateral)  Patient Location: PACU  Anesthesia Type: General  Level of Consciousness: awake, sedated, patient cooperative and responds to stimulation  Airway & Oxygen Therapy: Patient Spontanous Breathing and Patient connected to face mask oxygen  Post-op Assessment: Report given to PACU RN, Post -op Vital signs reviewed and stable and Patient moving all extremities  Post vital signs: Reviewed and stable  Complications: No apparent anesthesia complications

## 2021-08-03 NOTE — Anesthesia Procedure Notes (Signed)
Procedure Name: LMA Insertion Date/Time: 08/03/2021 9:04 AM Performed by: Justice Rocher, CRNA Pre-anesthesia Checklist: Patient identified, Emergency Drugs available, Suction available, Patient being monitored and Timeout performed Patient Re-evaluated:Patient Re-evaluated prior to induction Oxygen Delivery Method: Circle system utilized Preoxygenation: Pre-oxygenation with 100% oxygen Induction Type: IV induction Ventilation: Mask ventilation without difficulty LMA: LMA inserted LMA Size: 4.0 Number of attempts: 1 Airway Equipment and Method: Bite block Placement Confirmation: positive ETCO2, breath sounds checked- equal and bilateral and CO2 detector Tube secured with: Tape Dental Injury: Teeth and Oropharynx as per pre-operative assessment

## 2021-08-04 ENCOUNTER — Encounter (HOSPITAL_COMMUNITY): Payer: Self-pay | Admitting: Urology

## 2021-08-04 LAB — SURGICAL PATHOLOGY

## 2021-08-11 DIAGNOSIS — Z1152 Encounter for screening for COVID-19: Secondary | ICD-10-CM | POA: Diagnosis not present

## 2021-08-13 DIAGNOSIS — N200 Calculus of kidney: Secondary | ICD-10-CM | POA: Diagnosis not present

## 2021-08-13 DIAGNOSIS — R310 Gross hematuria: Secondary | ICD-10-CM | POA: Diagnosis not present

## 2021-08-13 DIAGNOSIS — R300 Dysuria: Secondary | ICD-10-CM | POA: Diagnosis not present

## 2021-08-21 ENCOUNTER — Encounter: Payer: Medicaid Other | Admitting: Cardiovascular Disease

## 2021-08-21 DIAGNOSIS — E785 Hyperlipidemia, unspecified: Secondary | ICD-10-CM | POA: Diagnosis not present

## 2021-08-21 DIAGNOSIS — I130 Hypertensive heart and chronic kidney disease with heart failure and stage 1 through stage 4 chronic kidney disease, or unspecified chronic kidney disease: Secondary | ICD-10-CM | POA: Diagnosis not present

## 2021-08-21 DIAGNOSIS — I5032 Chronic diastolic (congestive) heart failure: Secondary | ICD-10-CM | POA: Diagnosis not present

## 2021-08-21 DIAGNOSIS — N1831 Chronic kidney disease, stage 3a: Secondary | ICD-10-CM | POA: Diagnosis not present

## 2021-08-24 ENCOUNTER — Encounter: Payer: Medicaid Other | Admitting: Cardiovascular Disease

## 2021-08-25 DIAGNOSIS — Z1152 Encounter for screening for COVID-19: Secondary | ICD-10-CM | POA: Diagnosis not present

## 2021-08-27 DIAGNOSIS — H42 Glaucoma in diseases classified elsewhere: Secondary | ICD-10-CM | POA: Diagnosis not present

## 2021-08-27 DIAGNOSIS — J4520 Mild intermittent asthma, uncomplicated: Secondary | ICD-10-CM | POA: Diagnosis not present

## 2021-08-27 DIAGNOSIS — Z0001 Encounter for general adult medical examination with abnormal findings: Secondary | ICD-10-CM | POA: Diagnosis not present

## 2021-08-27 DIAGNOSIS — I10 Essential (primary) hypertension: Secondary | ICD-10-CM | POA: Diagnosis not present

## 2021-08-29 DIAGNOSIS — Z1152 Encounter for screening for COVID-19: Secondary | ICD-10-CM | POA: Diagnosis not present

## 2021-08-31 DIAGNOSIS — H04123 Dry eye syndrome of bilateral lacrimal glands: Secondary | ICD-10-CM | POA: Diagnosis not present

## 2021-08-31 DIAGNOSIS — H401133 Primary open-angle glaucoma, bilateral, severe stage: Secondary | ICD-10-CM | POA: Diagnosis not present

## 2021-09-11 ENCOUNTER — Ambulatory Visit (INDEPENDENT_AMBULATORY_CARE_PROVIDER_SITE_OTHER): Payer: Medicare Other | Admitting: Podiatry

## 2021-09-11 ENCOUNTER — Other Ambulatory Visit: Payer: Self-pay

## 2021-09-11 ENCOUNTER — Encounter: Payer: Self-pay | Admitting: Podiatry

## 2021-09-11 DIAGNOSIS — N1831 Chronic kidney disease, stage 3a: Secondary | ICD-10-CM

## 2021-09-11 DIAGNOSIS — M79674 Pain in right toe(s): Secondary | ICD-10-CM

## 2021-09-11 DIAGNOSIS — M79675 Pain in left toe(s): Secondary | ICD-10-CM | POA: Diagnosis not present

## 2021-09-11 DIAGNOSIS — I739 Peripheral vascular disease, unspecified: Secondary | ICD-10-CM

## 2021-09-11 DIAGNOSIS — B351 Tinea unguium: Secondary | ICD-10-CM | POA: Diagnosis not present

## 2021-09-11 DIAGNOSIS — D689 Coagulation defect, unspecified: Secondary | ICD-10-CM | POA: Diagnosis not present

## 2021-09-11 NOTE — Progress Notes (Signed)
This patient returns to my office for at risk foot care.  This patient requires this care by a professional since this patient will be at risk due to having , PAD and  Chronic kidney disease.  .  This patient is unable to cut nails himself since the patient cannot reach his nails.These nails are painful walking and wearing shoes.  This patient presents for at risk foot care today.  General Appearance  Alert, conversant and in no acute stress.  Vascular  Dorsalis pedis and posterior tibial  pulses are palpable  bilaterally.  Capillary return is within normal limits  bilaterally. Temperature is within normal limits  bilaterally.  Neurologic  Senn-Weinstein monofilament wire test within normal limits  bilaterally. Muscle power within normal limits bilaterally.  Nails Thick disfigured discolored nails with subungual debris  from hallux to fifth toes bilaterally. No evidence of bacterial infection or drainage bilaterally.  Orthopedic  No limitations of motion  feet .  No crepitus or effusions noted.  No bony pathology or digital deformities noted.  Skin  normotropic skin with no porokeratosis noted bilaterally.  No signs of infections or ulcers noted.     Onychomycosis  Pain in right toes  Pain in left toes  Consent was obtained for treatment procedures.   Mechanical debridement of nails 1-5  bilaterally performed with a nail nipper.  Filed with dremel without incident.    Return office visit   3 months                   Told patient to return for periodic foot care and evaluation due to potential at risk complications.   Gardiner Barefoot DPM

## 2021-09-14 ENCOUNTER — Ambulatory Visit: Payer: 59 | Admitting: Cardiovascular Disease

## 2021-09-28 IMAGING — CR DG CHEST 2V
2 series · 2 of 2 positions shown · non-contrast
Comparison: Radiograph 11/06/2019

CLINICAL DATA: ICD in place.

EXAM:
CHEST - 2 VIEW

[chest lat]
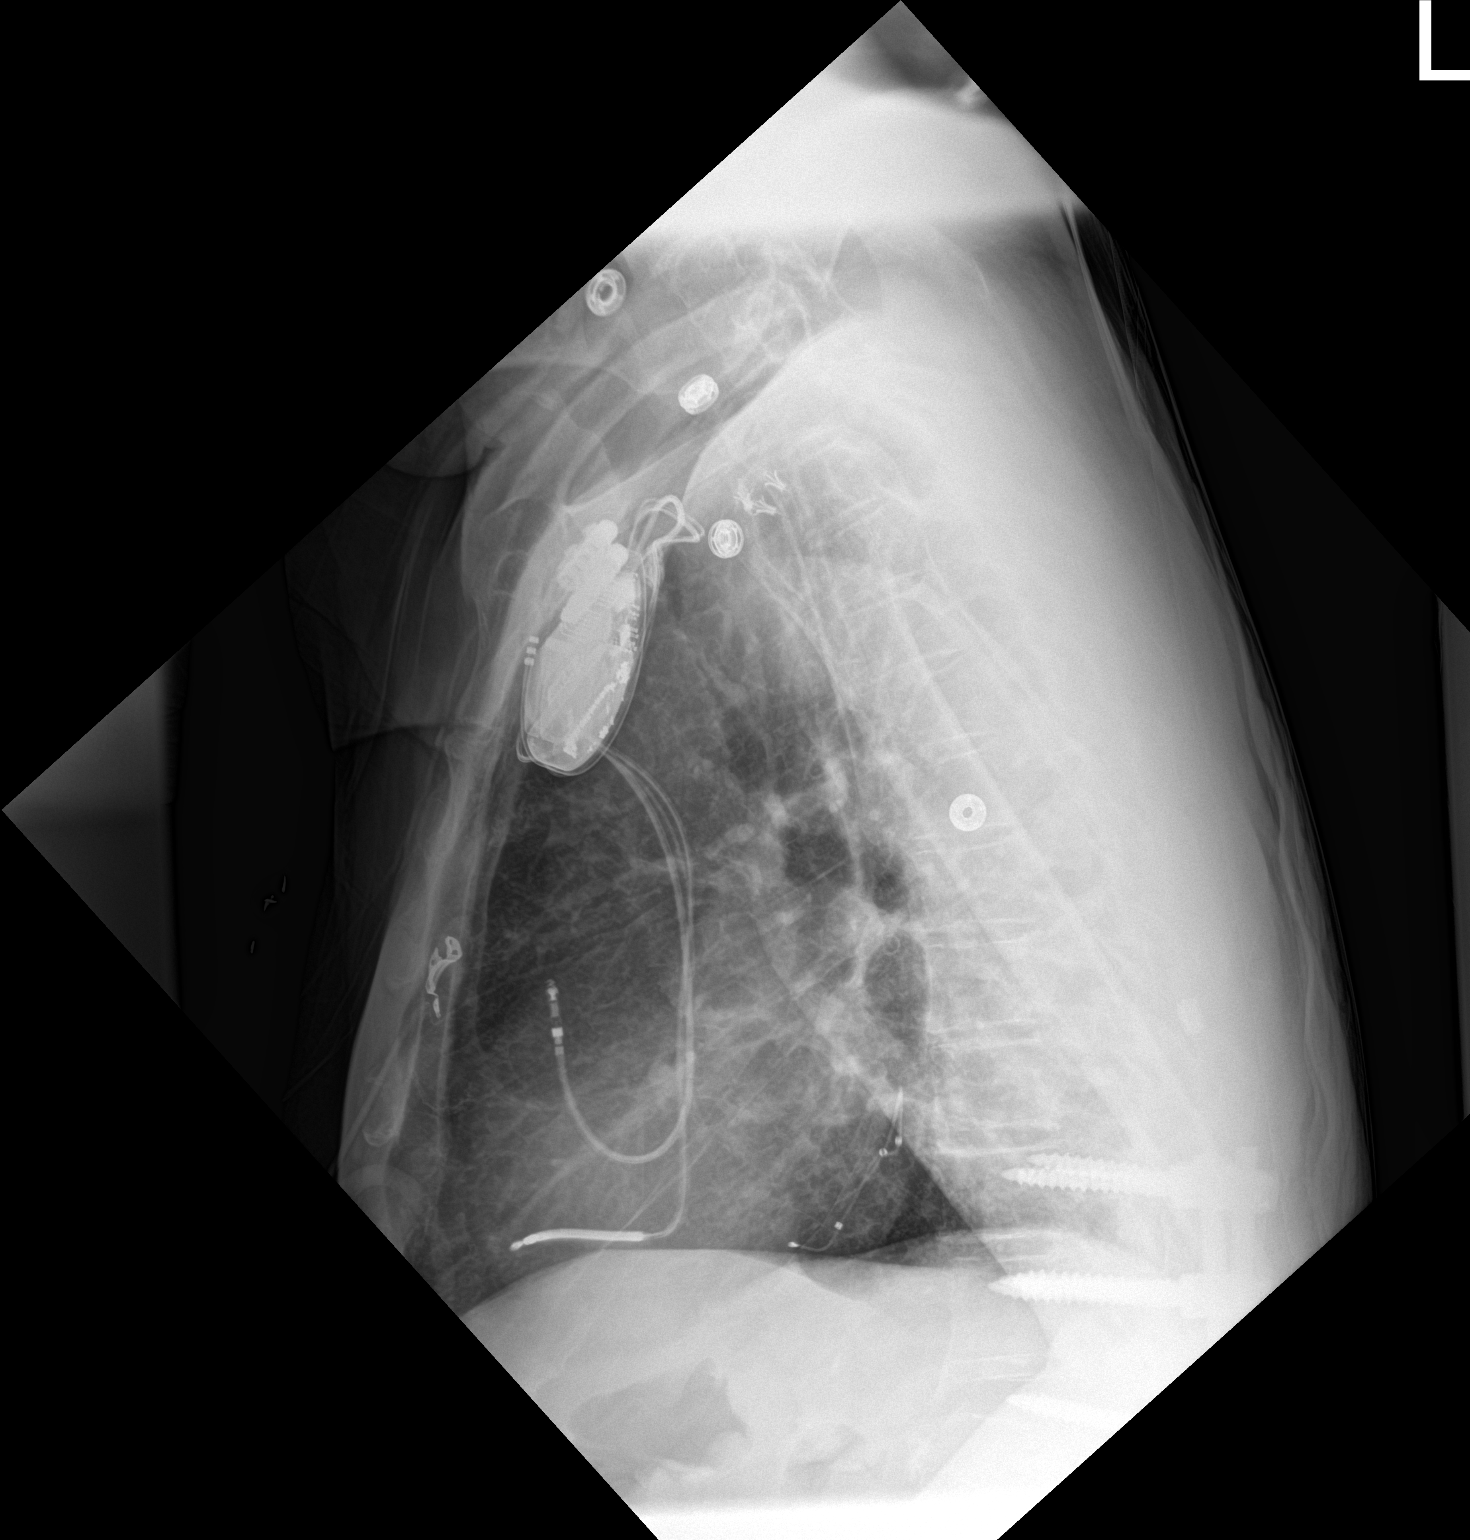

[chest ap]
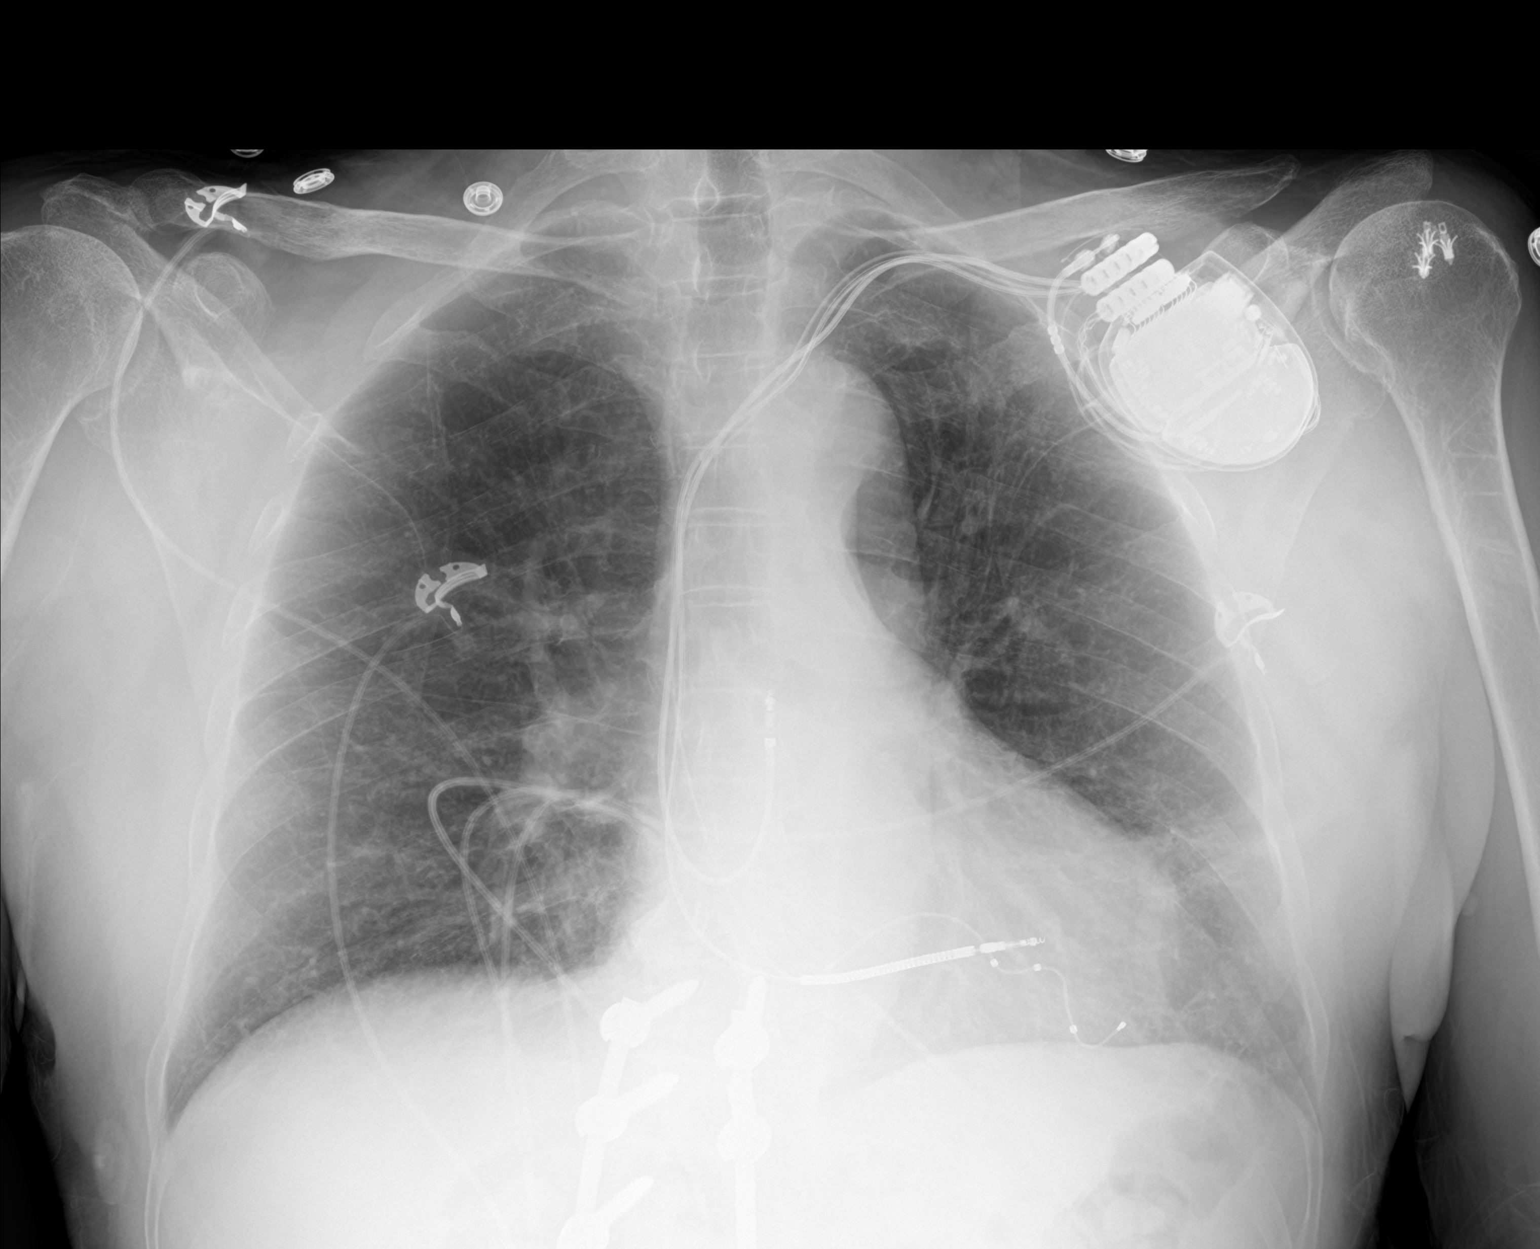

[2 of 2 positions shown; findings below may reference images not displayed]

FINDINGS: New battery pack for left-sided pacemaker. New lead projecting over
the cardiac apex. Additional leads projecting over the right atrium
and ventricle are unchanged. No pneumothorax. Heart is normal in
size. Normal mediastinal contours. No pulmonary edema or focal
airspace disease. No pleural fluid. No acute osseous abnormalities.
Anchors in the left proximal humerus from prior rotator cuff repair.
IMPRESSION: Left-sided pacemaker with the lead projecting over the cardiac apex.
No pneumothorax.

## 2021-10-04 DIAGNOSIS — Z1152 Encounter for screening for COVID-19: Secondary | ICD-10-CM | POA: Diagnosis not present

## 2021-10-05 ENCOUNTER — Encounter: Payer: Self-pay | Admitting: Cardiovascular Disease

## 2021-10-05 ENCOUNTER — Other Ambulatory Visit: Payer: Self-pay

## 2021-10-05 ENCOUNTER — Ambulatory Visit (INDEPENDENT_AMBULATORY_CARE_PROVIDER_SITE_OTHER): Payer: Medicare Other | Admitting: Cardiovascular Disease

## 2021-10-05 VITALS — BP 142/90 | HR 77 | Ht 71.0 in | Wt 169.2 lb

## 2021-10-05 DIAGNOSIS — I4729 Other ventricular tachycardia: Secondary | ICD-10-CM | POA: Diagnosis not present

## 2021-10-05 DIAGNOSIS — I700 Atherosclerosis of aorta: Secondary | ICD-10-CM

## 2021-10-05 DIAGNOSIS — I495 Sick sinus syndrome: Secondary | ICD-10-CM | POA: Diagnosis not present

## 2021-10-05 DIAGNOSIS — N1832 Chronic kidney disease, stage 3b: Secondary | ICD-10-CM

## 2021-10-05 DIAGNOSIS — I5042 Chronic combined systolic (congestive) and diastolic (congestive) heart failure: Secondary | ICD-10-CM | POA: Diagnosis not present

## 2021-10-05 DIAGNOSIS — I441 Atrioventricular block, second degree: Secondary | ICD-10-CM | POA: Diagnosis not present

## 2021-10-05 DIAGNOSIS — I480 Paroxysmal atrial fibrillation: Secondary | ICD-10-CM

## 2021-10-05 DIAGNOSIS — I10 Essential (primary) hypertension: Secondary | ICD-10-CM | POA: Diagnosis not present

## 2021-10-05 DIAGNOSIS — E785 Hyperlipidemia, unspecified: Secondary | ICD-10-CM

## 2021-10-05 DIAGNOSIS — I25118 Atherosclerotic heart disease of native coronary artery with other forms of angina pectoris: Secondary | ICD-10-CM

## 2021-10-05 DIAGNOSIS — Z95810 Presence of automatic (implantable) cardiac defibrillator: Secondary | ICD-10-CM

## 2021-10-05 DIAGNOSIS — I472 Ventricular tachycardia, unspecified: Secondary | ICD-10-CM

## 2021-10-05 MED ORDER — APIXABAN 5 MG PO TABS: 5.0000 mg | ORAL_TABLET | Freq: Two times a day (BID) | ORAL | 11 refills | Status: DC

## 2021-10-05 NOTE — Progress Notes (Signed)
Cardiology Office Note:    Date:  10/08/2021   ID:  [name], DOB February 04, 1941, MRN [id number]  PCP:  Lucianne Lei, MD  Cardiologist: Claiborne Billings Referring MD: Lucianne Lei, MD   No chief complaint on file.   History of Present Illness:    [name] is a 80 y.o. male with a hx of dilated cardiomyopathy (EF as low as 20-25% in 2012, improved, last echo August 2021 showed here today to discuss initiation of anticoagulation therapy due to recurrent episodes of atrial fibrillation recorded on his device.  LVEF 40-45%.), history of ventricular tachycardia ablation (July 2013), coronary artery disease with a non-STEMI 2012, nonobstructive CAD by cath (low risk nuclear stress test May 2016, normal perfusion, runs of nonsustained VT, EF 51%), status post implantation of a dual-chamber permanent pacemaker for sinus bradycardia and second-degree AV block Mobitz type I (August 2019, [place]), asymptomatic paroxysmal atrial fibrillation recorded by pacemaker, presenting with ventricular tachycardia and syncope in December 2020, underwent upgrade to CRT-D device ([place], new [place] ICD lead, Dr. Lovena Le).  He is here to discuss the increased prevalence of atrial fibrillation on his pacemaker and to start anticoagulation therapy.  The overall burden of atrial fibrillation has increased approximately 1%, with several episodes lasting for hours at a time.  Otherwise he has 90% atrial paced, 93% biventricular paced rhythm and normal device function.  Estimated generator longevity is 4.8-5.1 years.  Lead parameters are normal.  There have been no recent episodes of ventricular tachycardia.  The patient specifically denies any chest pain at rest exertion, dyspnea at rest or with exertion, orthopnea, paroxysmal nocturnal dyspnea, syncope, palpitations, focal neurological deficits, intermittent claudication, lower extremity edema, unexplained weight gain, cough, hemoptysis or  wheezing.  He is unaware of the atrial fibrillation.  He has a history of epistaxis and has had to be seen by an ear nose and throat specialist in the past. Eliquis was stopped last June for this.  At home his blood pressure is often elevated in the 140s.  The lowest systolic blood pressure he has seen is 137.   Past Medical History:  Diagnosis Date   AICD (automatic cardioverter/defibrillator) present    CAD (coronary artery disease) 80% stenosis diag of the LAD, 30% in OM2 branch of LCX in 2009    a. Nonobstructive CAD by cath 11/2011 with the exception of the pre-existing diagonal branch #2 lesion.   Chronic systolic CHF (congestive heart failure) (HCC)    CKD (chronic kidney disease) stage 3, GFR 30-59 ml/min (HCC)    Colon polyp, hyperplastic    History of stress test 06/01/2012   Normal myocardial perfusion study. compared to the previous study there is no significant change. this is a low risk scan   Hypertension    Legally blind    "both eyes"   Myocardial infarction [facility]) 11/22/2011   NICM (nonischemic cardiomyopathy) ([place])    a. Remote hx of dilated NICM with EF ranging 20-45%, including normal EF by echo (55-60%) in 2014.   Peripheral arterial disease ([place])    a. 06/2014: ABI right 0.99, left 1.2, LE dopplers revealing an occluded right posterior tibial. As symptoms were not felt r/t claudication, no further w/u at the time.   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 Requiring discontinuation of BB dose.   Spondylolisthesis    Ventricular bigeminy    Ventricular tachycardia (  paroxysmal) 04/11/2015    Past Surgical History:  Procedure Laterality Date   BACK SURGERY     BIV UPGRADE N/A 11/08/2019   Procedure: BIV UPGRADE;  Surgeon: Evans Lance, MD;  Location: [place] CV LAB;  Service: Cardiovascular;  Laterality: N/A;   CARDIAC CATHETERIZATION  11/2011   CARDIAC CATHETERIZATION  11/2011   didn't  demonstrate high grade obstructive disease to account for his LV dysfunction.   CATARACT EXTRACTION, BILATERAL  1990's   CYSTOSCOPY     CYSTOSCOPY WITH BIOPSY Bilateral 08/03/2021   Procedure: CYSTOSCOPY WITH BLADDER BIOPSY, BILATERAL RETROGRADE PYELOGRAM;  Surgeon: Lucas Mallow, MD;  Location: [facility] ORS;  Service: Urology;  Laterality: Bilateral;  REQUESTING [place] N/A 05/04/2013   Procedure: CYSTOSCOPY WITH URETHRAL DILATATION;  Surgeon: Eustace Moore, MD;  Location: [place] NEURO ORS;  Service: Neurosurgery;  Laterality: N/A;  with insertion of foley catheter   EP study and ablation of VT  7/13   PVC focus mapped to the right coronary cusp of the aorta, limited ablation performed due to proximity of the focus to the right coronary artery   EYE SURGERY     LEFT HEART CATH AND CORONARY ANGIOGRAPHY N/A 11/07/2019   Procedure: LEFT HEART CATH AND CORONARY ANGIOGRAPHY;  Surgeon: Wellington Hampshire, MD;  Location: [place] CV LAB;  Service: Cardiovascular;  Laterality: N/A;   LEFT HEART CATHETERIZATION WITH CORONARY ANGIOGRAM N/A 11/25/2011   Procedure: LEFT HEART CATHETERIZATION WITH CORONARY ANGIOGRAM;  Surgeon: Leonie Man, MD;  Location: [facility] CATH LAB;  Service: Cardiovascular;  Laterality: N/A;   PACEMAKER IMPLANT N/A 07/12/2018   Procedure: PACEMAKER IMPLANT;  Surgeon: Sanda Klein, MD;  Location: [place] CV LAB;  Service: Cardiovascular;  Laterality: N/A;   POSTERIOR FUSION LUMBAR SPINE  1979   ROTATOR CUFF REPAIR  2000's   left   V-TACH ABLATION N/A 06/06/2012   Procedure: V-TACH ABLATION;  Surgeon: Thompson Grayer, MD;  Location: [facility] CATH LAB;  Service: Cardiovascular;  Laterality: N/A;    Current Medications: Current Meds  Medication Sig   albuterol (VENTOLIN HFA) 108 (90 Base) MCG/ACT inhaler Inhale 1-2 puffs into the lungs every 6 (six) hours as needed for wheezing or shortness of breath.   apixaban (ELIQUIS) 5 MG TABS tablet Take 1 tablet (5  mg total) by mouth 2 (two) times daily.   dorzolamide-timolol (COSOPT) 22.3-6.8 MG/ML ophthalmic solution Place 1 drop into both eyes 2 (two) times daily.   furosemide (LASIX) 20 MG tablet TAKE 1 TABLET(20 MG) BY MOUTH DAILY   LUMIGAN 0.01 % SOLN Place 1 drop into both eyes at bedtime.    metoprolol succinate (TOPROL-XL) 100 MG 24 hr tablet TAKE 1 TABLET(100 MG) BY MOUTH DAILY WITH OR IMMEDIATELY FOLLOWING A MEAL   RESTASIS 0.05 % ophthalmic emulsion Place 1 drop into both eyes 2 (two) times daily.   spironolactone (ALDACTONE) 25 MG tablet TAKE 1/2 TABLET(12.5 MG) BY MOUTH TWICE DAILY (Patient taking differently: Take 12.5 mg by mouth daily.)   triamcinolone ointment (KENALOG) 0.5 % Apply 1 application topically 2 (two) times daily.   [DISCONTINUED] aspirin EC 81 MG tablet Take 1 tablet (81 mg total) by mouth daily. Swallow whole.     Allergies:   Patient has no allergy information on record.   Social History   Socioeconomic History   Marital status: Legally Separated    Spouse name: Not on file   Number of children: 3   Years  of education: Not on file   Highest education level: Not on file  Occupational History   Occupation: Retired    Fish farm manager: RETIRED  Tobacco Use   Smoking status: Former    Years: 50.00    Types: Cigarettes    Quit date: 11/23/1999    Years since quitting: 21.8   Smokeless tobacco: Never  Vaping Use   Vaping Use: Never used  Substance and Sexual Activity   Alcohol use: No    Comment: 05/26/12 "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; stopped drinking in the early 1980's"   Drug use: No   Sexual activity: Yes  Other Topics Concern   Not on file  Social History Narrative   Not on file   Social Determinants of Health   Financial Resource Strain: Not on file  Food Insecurity: No Food Insecurity   Worried About [place] in the Last Year: Never true   [place] in the Last Year: Never true  Transportation Needs: No Transportation Needs   Lack of Transportation  (Medical): No   Lack of Transportation (Non-Medical): No  Physical Activity: Not on file  Stress: Not on file  Social Connections: Not on file     Family History: The patient's family history includes Asthma in his mother; Other in an other family member.  ROS:   Please see the history of present illness.    All other systems are reviewed and are negative.   EKGs/Labs/Other Studies Reviewed:    The following studies were reviewed today: Comprehensive pacemaker check in the office today  EKG:  EKG is ordered today.  It shows AV sequential pacing with rare PVCs and a distinct positive R wave in lead V1  Recent Labs: 07/05/2021: ALT 17 07/16/2021: BUN 15; Creatinine, Ser 1.48; Hemoglobin 13.7; Platelets 135; Potassium 3.4; Sodium 141    Recent Lipid Panel    Component Value Date/Time   CHOL 107 04/13/2019 1123   TRIG 85 04/13/2019 1123   HDL 50 04/13/2019 1123   CHOLHDL 2.1 04/13/2019 1123   CHOLHDL 2.2 01/24/2017 0950   VLDL 8 01/24/2017 0950   LDLCALC 40 04/13/2019 1123    Physical Exam:    VS:  BP (!) 142/90 (BP Location: Left Arm, Patient Position: Sitting, Cuff Size: Normal)   Pulse 77   Ht 5\' 11"  (1.803 m)   Wt 169 lb 3.2 oz (76.7 kg)   SpO2 97%   BMI 23.60 kg/m     Wt Readings from Last 3 Encounters:  10/05/21 169 lb 3.2 oz (76.7 kg)  07/21/21 169 lb (76.7 kg)  07/16/21 168 lb (76.2 kg)     General: Alert, oriented x3, no distress, healthy defibrillator site. Head: no evidence of trauma, PERRL, EOMI, no exophtalmos or lid lag, no myxedema, no xanthelasma; normal ears, nose and oropharynx Neck: normal jugular venous pulsations and no hepatojugular reflux; brisk carotid pulses without delay and no carotid bruits Chest: clear to auscultation, no signs of consolidation by percussion or palpation, normal fremitus, symmetrical and full respiratory excursions Cardiovascular: normal position and quality of the apical impulse, regular rhythm, normal first and  paradoxically split second heart sounds, no murmurs, rubs or gallops Abdomen: no tenderness or distention, no masses by palpation, no abnormal pulsatility or arterial bruits, normal bowel sounds, no hepatosplenomegaly Extremities: no clubbing, cyanosis or edema; 2+ radial, ulnar and brachial pulses bilaterally; 2+ right femoral, posterior tibial and dorsalis pedis pulses; 2+ left femoral, posterior tibial and dorsalis pedis pulses;  no subclavian or femoral bruits Neurological: grossly nonfocal Psych: Normal mood and affect    ASSESSMENT:    1. Paroxysmal atrial fibrillation (HCC)   2. Second degree AV block, Mobitz type I   3. Biventricular automatic implantable cardioverter defibrillator in situ   4. SSS (sick sinus syndrome) ([place])   5. Chronic combined systolic and diastolic heart failure ([place])   6. Coronary artery disease of native artery of native heart with stable angina pectoris ([place])   7. Ventricular tachycardia (paroxysmal)   8. Essential hypertension   9. Stage 3b chronic kidney disease ([place])   10. Atherosclerosis of aorta ([place])   11. Hyperlipidemia with target LDL less than 70     PLAN:    In order of problems listed above:  Paroxysmal atrial fibrillation: The arrhythmia is asymptomatic and not associated with rapid ventricular rates.  It is only leading to fairly minor interference with his CRT.  The decrease in CRT seems to be primarily due to PVCs. CHADSVasc 5 (age 31, HTN, CHF, CAD).  He has not been on anticoagulants since November 2020.  Start Eliquis 5 mg twice daily.  Avoid epistaxis by using humidifiers, moisture barriers, saline nasal spray. If epistaxis is again severe, refer for [place] device. 2nd deg AVB: Stable less than perfect prevalence of biventricular pacing at 93% or so, reduced primarily due to frequent PVCs. CRT-D: Otherwise normal device function.  Continue remote downloads every 3 months. SSS: With current sensor settings his atrial heart rate  histogram distribution appears appropriate.  He does not have symptoms of chronotropic incompetence. CHF: Mildly decreased LV systolic function, currently NYHA functional class I without any clinical evidence of hypervolemia, although he does not take any loop diuretics.  He is on chronic treatment with metoprolol and spironolactone.  Defer decision add Entresto at Dr. Claiborne Billings (his notes expressed some concern about his renal function stability). CAD: Does not have angina on combination beta-blocker, long-acting nitrates NSVT: Syncope related to VT last December.  Had a VT ablation in 2013.  Rare episodes of nonsustained VT have been detected but have not led to device intervention HTN: A little higher than desirable CKD 3: Creatinine has been a little volatile, most recently 1.48. Borderline renal function for Eliquis dosing. Ao atherosclerosis: noted on October 2021 CT. HLP: Excellent LDL cholesterol of 59 on most recent labs, not on statin.  Borderline hemoglobin A1c 5.8%.   Medication Adjustments/Labs and Tests Ordered: Current medicines are reviewed at length with the patient today.  Concerns regarding medicines are outlined above.  No orders of the defined types were placed in this encounter.  Meds ordered this encounter  Medications   apixaban (ELIQUIS) 5 MG TABS tablet    Sig: Take 1 tablet (5 mg total) by mouth 2 (two) times daily.    Dispense:  60 tablet    Refill:  11     Patient Instructions  Medication Instructions:  START Eliquis 5 mg twice daily  STOP the Aspirin  *If you need a refill on your cardiac medications before your next appointment, please call your pharmacy*   Lab Work: None ordered If you have labs (blood work) drawn today and your tests are completely normal, you will receive your results only by: [place] (if you have MyChart) OR A paper copy in the mail If you have any lab test that is abnormal or we need to change your treatment, we will call  you to review the results.   Testing/Procedures: None ordered  Follow-Up: At [facility], you and your health needs are our priority.  As part of our continuing mission to provide you with exceptional heart care, we have created designated Provider Care Teams.  These Care Teams include your primary Cardiologist (physician) and Advanced Practice Providers (APPs -  Physician Assistants and Nurse Practitioners) who all work together to provide you with the care you need, when you need it.  We recommend signing up for the patient portal called "MyChart".  Sign up information is provided on this After Visit Summary.  MyChart is used to connect with patients for Virtual Visits (Telemedicine).  Patients are able to view lab/test results, encounter notes, upcoming appointments, etc.  Non-urgent messages can be sent to your provider as well.   To learn more about what you can do with MyChart, go to [email or web address].    Your next appointment:   12 month(s)  The format for your next appointment:   In Person  Provider:   Sanda Klein, MD   Signed, Sanda Klein, MD  10/08/2021 1:20 PM    [place]

## 2021-10-05 NOTE — Patient Instructions (Addendum)
Medication Instructions:  START Eliquis 5 mg twice daily  STOP the Aspirin  *If you need a refill on your cardiac medications before your next appointment, please call your pharmacy*   Lab Work: None ordered If you have labs (blood work) drawn today and your tests are completely normal, you will receive your results only by: [place] (if you have MyChart) OR A paper copy in the mail If you have any lab test that is abnormal or we need to change your treatment, we will call you to review the results.   Testing/Procedures: None ordered   Follow-Up: At [facility], you and your health needs are our priority.  As part of our continuing mission to provide you with exceptional heart care, we have created designated Provider Care Teams.  These Care Teams include your primary Cardiologist (physician) and Advanced Practice Providers (APPs -  Physician Assistants and Nurse Practitioners) who all work together to provide you with the care you need, when you need it.  We recommend signing up for the patient portal called "MyChart".  Sign up information is provided on this After Visit Summary.  MyChart is used to connect with patients for Virtual Visits (Telemedicine).  Patients are able to view lab/test results, encounter notes, upcoming appointments, etc.  Non-urgent messages can be sent to your provider as well.   To learn more about what you can do with MyChart, go to [email or web address].    Your next appointment:   12 month(s)  The format for your next appointment:   In Person  Provider:   Sanda Klein, MD

## 2021-10-08 MED ORDER — APIXABAN 2.5 MG PO TABS: 2.5000 mg | ORAL_TABLET | Freq: Two times a day (BID) | ORAL | 3 refills | Status: DC

## 2021-10-08 NOTE — Progress Notes (Addendum)
Took a closer look at [name]'s labs.  Even though his most recent creatinine was 1.48, on multiple other assays that this year his creatinine has been consistently higher (typically in the 1.6-1.7 range). Since he is over the age of 53 and has a creatinine that is probably usually over 1.5, will reduce the dose of Eliquis to 2.5 mg twice daily.  I called [name] on the phone and asked him to take half of the current 5 mg tablets twice daily.  His next prescription will be for the 2.5 mg strength tablets.  Sanda Klein, MD, [facility] [phone] office [phone] pager 10/08/2021 1:22 PM

## 2021-10-20 DIAGNOSIS — Z1152 Encounter for screening for COVID-19: Secondary | ICD-10-CM | POA: Diagnosis not present

## 2021-10-21 DIAGNOSIS — I5032 Chronic diastolic (congestive) heart failure: Secondary | ICD-10-CM | POA: Diagnosis not present

## 2021-10-21 DIAGNOSIS — E785 Hyperlipidemia, unspecified: Secondary | ICD-10-CM | POA: Diagnosis not present

## 2021-10-21 DIAGNOSIS — N1831 Chronic kidney disease, stage 3a: Secondary | ICD-10-CM | POA: Diagnosis not present

## 2021-10-21 DIAGNOSIS — I130 Hypertensive heart and chronic kidney disease with heart failure and stage 1 through stage 4 chronic kidney disease, or unspecified chronic kidney disease: Secondary | ICD-10-CM | POA: Diagnosis not present

## 2021-10-26 ENCOUNTER — Telehealth: Payer: Self-pay

## 2021-10-26 NOTE — Telephone Encounter (Signed)
Patient came into office today he got a new phone and the merlin app was not on the phone. We have assisted him and got the app set up and running. Patient aware of how to work it and had no further questions

## 2021-10-27 ENCOUNTER — Ambulatory Visit (HOSPITAL_COMMUNITY)
Admission: EM | Admit: 2021-10-27 | Discharge: 2021-10-27 | Disposition: A | Discharge status: Home / Self Care | Payer: Medicare Other | Attending: Emergency Medicine | Admitting: Emergency Medicine

## 2021-10-27 ENCOUNTER — Other Ambulatory Visit: Payer: Self-pay

## 2021-10-27 DIAGNOSIS — W57XXXA Bitten or stung by nonvenomous insect and other nonvenomous arthropods, initial encounter: Secondary | ICD-10-CM

## 2021-10-27 DIAGNOSIS — S0006XA Insect bite (nonvenomous) of scalp, initial encounter: Secondary | ICD-10-CM | POA: Diagnosis not present

## 2021-10-27 MED ORDER — DOXYCYCLINE HYCLATE 100 MG PO CAPS: 100.0000 mg | ORAL_CAPSULE | Freq: Two times a day (BID) | ORAL | 0 refills | Status: DC

## 2021-10-27 NOTE — Discharge Instructions (Signed)
Because the area of concern has been present for 2 weeks and has not healed and has some drainage we will use an antibiotic to help clear any possible infection  Take doxycycline twice a day for the next 7 days  Please call your primary care doctor and schedule an appointment for 2 weeks therefore if you need to be evaluated you will already be on her schedule  You do not need to do any daily cleansing to the affected area because your medicine is working from the inside out to help fix it

## 2021-10-27 NOTE — ED Provider Notes (Signed)
[name]    CSN: [id number] Arrival date & time: 10/27/21  1247      History   Chief Complaint Chief Complaint  Patient presents with   Insect Bite    HPI [name] is a 80 y.o. male.   Patient presents with lesions to posterior right side of head for 2 weeks after bug bite. Endorses that he smacked and killed the bug.. Attempted to clean with peroxide and alcohol to cleanse it. Intermittently has felt clear drainage and affected area is tender to touch. Denies fever, chills.  Past Medical History:  Diagnosis Date   AICD (automatic cardioverter/defibrillator) present    CAD (coronary artery disease) 80% stenosis diag of the LAD, 30% in OM2 branch of LCX in 2009    a. Nonobstructive CAD by cath 11/2011 with the exception of the pre-existing diagonal branch #2 lesion.   Chronic systolic CHF (congestive heart failure) (HCC)    CKD (chronic kidney disease) stage 3, GFR 30-59 ml/min (HCC)    Colon polyp, hyperplastic    History of stress test 06/01/2012   Normal myocardial perfusion study. compared to the previous study there is no significant change. 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 Remote hx of dilated NICM with EF ranging 20-45%, including normal EF by echo (55-60%) in 2014.   Peripheral arterial disease ([place])    a. 06/2014: ABI right 0.99, left 1.2, LE dopplers revealing an occluded right posterior tibial. As symptoms were not felt r/t claudication, no further w/u at the time.   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 Requiring discontinuation of BB dose.   Spondylolisthesis    Ventricular bigeminy    Ventricular tachycardia (paroxysmal) 04/11/2015    Patient Active Problem List   Diagnosis Date Noted   Abnormal CT of the chest 09/04/2020   COPD (chronic  obstructive pulmonary disease) ([place]) 09/04/2020   ICD (implantable cardioverter-defibrillator) in place    Cardiac syncope 11/07/2019   Closed fracture of distal fibula 11/07/2019   Pain due to onychomycosis of toenails of both feet 05/16/2019   Coagulation disorder ([place]) 05/16/2019   Paroxysmal atrial fibrillation ([place]) 08/02/2018   NSVT (nonsustained ventricular tachycardia) 08/02/2018   Symptomatic bradycardia 07/12/2018   Pacemaker 07/12/2018   Tachycardia-bradycardia syndrome ([place])    Unsteady gait 03/12/2016   Nausea vomiting and diarrhea 03/11/2016   Acute lower UTI 03/11/2016   Gastroenteritis 03/11/2016   Obstipation 03/11/2016   Abdominal pain    Constipation    Lactic acidosis    Leg pain    Essential hypertension 03/04/2016   Ventricular tachycardia (paroxysmal) ([place]) 04/11/2015   Chest pain with moderate risk for cardiac etiology 04/09/2015   CAP (community acquired pneumonia) 02/18/2015   Chest pain at rest 02/18/2015   Peripheral arterial disease ([place]) 08/15/2014   Hyperlipidemia with target LDL less than 70 01/01/2014   GERD (gastroesophageal reflux disease) 01/01/2014   First degree AV block 07/31/2012   Unstable angina, negative MI 06/05/2012   Second degree AV block, Mobitz type I 05/27/2012   CKD (chronic kidney disease) stage 3, GFR 30-59 ml/min ([place]) 05/27/2012   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; 20% January 2013 35% April 2013 - normalized in 2014 05/26/2012   Chronic diastolic CHF (congestive  heart failure) ([place]) 11/23/2011    Class: Diagnosis of   Coronary artery disease involving native coronary artery of native heart without angina pectoris 11/22/2011   Premature ventricular contractions 11/22/2011   Esophageal reflux 07/28/2011    Past Surgical History:  Procedure Laterality Date   BACK SURGERY     BIV UPGRADE N/A 11/08/2019   Procedure: BIV UPGRADE;  Surgeon: Evans Lance, MD;  Location: [place] [facility];  Service:  Cardiovascular;  Laterality: N/A;   CARDIAC CATHETERIZATION  11/2011   CARDIAC CATHETERIZATION  11/2011   didn't demonstrate high grade obstructive disease to account for his LV dysfunction.   CATARACT EXTRACTION, BILATERAL  1990's   CYSTOSCOPY     CYSTOSCOPY WITH BIOPSY Bilateral 08/03/2021   Procedure: CYSTOSCOPY WITH BLADDER BIOPSY, BILATERAL RETROGRADE PYELOGRAM;  Surgeon: Lucas Mallow, MD;  Location: [facility];  Service: Urology;  Laterality: Bilateral;  REQUESTING [place] N/A 05/04/2013   Procedure: CYSTOSCOPY WITH URETHRAL DILATATION;  Surgeon: Eustace Moore, MD;  Location: [place] [facility];  Service: Neurosurgery;  Laterality: N/A;  with insertion of foley catheter   EP study and ablation of VT  7/13   PVC focus mapped to the right coronary cusp of the aorta, limited ablation performed due to proximity of the focus to the right coronary artery   EYE SURGERY     LEFT HEART CATH AND CORONARY ANGIOGRAPHY N/A 11/07/2019   Procedure: LEFT HEART CATH AND CORONARY ANGIOGRAPHY;  Surgeon: Wellington Hampshire, MD;  Location: [place] [facility];  Service: Cardiovascular;  Laterality: N/A;   LEFT HEART CATHETERIZATION WITH CORONARY ANGIOGRAM N/A 11/25/2011   Procedure: LEFT HEART CATHETERIZATION WITH CORONARY ANGIOGRAM;  Surgeon: Leonie Man, MD;  Location: [facility] CATH LAB;  Service: Cardiovascular;  Laterality: N/A;   PACEMAKER IMPLANT N/A 07/12/2018   Procedure: PACEMAKER IMPLANT;  Surgeon: Sanda Klein, MD;  Location: [place] [facility];  Service: Cardiovascular;  Laterality: N/A;   POSTERIOR FUSION LUMBAR SPINE  1979   ROTATOR CUFF REPAIR  2000's   left   V-TACH ABLATION N/A 06/06/2012   Procedure: V-TACH ABLATION;  Surgeon: Thompson Grayer, MD;  Location: [facility] C[facility] LAB;  Service: Cardiovascular;  Laterality: N/A;       Home Medications    Prior to Admission medications   Medication Sig Start Date End Date Taking? Authorizing Provider  albuterol (VENTOLIN  HFA) 108 (90 Base) MCG/ACT inhaler Inhale 1-2 puffs into the lungs every 6 (six) hours as needed for wheezing or shortness of breath.    [provider]  apixaban (ELIQUIS) 2.5 MG TABS tablet Take 1 tablet (2.5 mg total) by mouth 2 (two) times daily. 10/08/21   Croitoru, Mihai, MD  BREO ELLIPTA 100-25 MCG/INH AEPB Inhale 1 puff into the lungs every morning. Patient not taking: Reported on 10/05/2021 05/07/20   [provider]  dorzolamide-timolol (COSOPT) 22.3-6.8 MG/ML ophthalmic solution Place 1 drop into both eyes 2 (two) times daily. 03/14/20   [provider]  fluconazole (DIFLUCAN) 200 MG tablet Take 200 mg by mouth daily. Patient not taking: Reported on 10/05/2021 07/13/21   [provider]  furosemide (LASIX) 20 MG tablet TAKE 1 TABLET(20 MG) BY MOUTH DAILY 06/18/21   Troy Sine, MD  HYDROcodone-acetaminophen (NORCO/VICODIN) 5-325 MG tablet Take 1 tablet by mouth every 4 (four) hours as needed. Patient not taking: Reported on 10/05/2021 12/19/19   Ezequiel Essex, MD  isosorbide mononitrate (IMDUR) 30 MG 24  hr tablet Take 30 mg by mouth daily. Patient not taking: Reported on 10/05/2021 02/24/20   [provider]  LUMIGAN 0.01 % SOLN Place 1 drop into both eyes at bedtime.  12/06/14   [provider]  metoprolol succinate (TOPROL-XL) 100 MG 24 hr tablet TAKE 1 TABLET(100 MG) BY MOUTH DAILY WITH OR IMMEDIATELY FOLLOWING A MEAL 11/05/20   Troy Sine, MD  nitroGLYCERIN (NITROSTAT) 0.4 MG SL tablet  04/22/20   [provider]  RESTASIS 0.05 % ophthalmic emulsion Place 1 drop into both eyes 2 (two) times daily. 12/30/14   [provider]  spironolactone (ALDACTONE) 25 MG tablet TAKE 1/2 TABLET(12.5 MG) BY MOUTH TWICE DAILY Patient taking differently: Take 12.5 mg by mouth daily. 05/13/21   Troy Sine, MD  triamcinolone ointment (KENALOG) 0.5 % Apply 1 application topically 2 (two) times daily. 06/26/21   [provider]    Family History Family History  Problem Relation Age of Onset   Asthma Mother    Other Other        Unsure if any heart disease in his family.    Social History Social History   Tobacco Use   Smoking status: Former    Years: 50.00    Types: Cigarettes    Quit date: 11/23/1999    Years since quitting: 21.9   Smokeless tobacco: Never  Vaping Use   Vaping Use: Never used  Substance Use Topics   Alcohol use: No    Comment: 05/26/12 "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; stopped drinking in the early 1980's"   Drug use: No     Allergies   Patient has no known allergies.   Review of Systems Review of Systems  Constitutional: Negative.   Respiratory: Negative.    Cardiovascular: Negative.   Gastrointestinal: Negative.   Genitourinary: Negative.   Musculoskeletal: Negative.   Skin:  Positive for wound. Negative for color change, pallor and rash.  Neurological: Negative.     Physical Exam Triage Vital Signs ED Triage Vitals  Enc Vitals Group     BP 10/27/21 1401 (!) 146/88     Pulse Rate 10/27/21 1401 82     Resp 10/27/21 1401 18     Temp 10/27/21 1401 98.4 F (36.9 C)     Temp Source 10/27/21 1401 Oral     SpO2 10/27/21 1401 95 %     Weight --      Height --      Head Circumference --      Peak Flow --      Pain Score 10/27/21 1359 4     Pain Loc --      Pain Edu? --      Excl. in [place]? --    No data found.  Updated Vital Signs BP (!) 146/88 (BP Location: Left Arm)   Pulse 82   Temp 98.4 F (36.9 C) (Oral)   Resp 18   SpO2 95%   Visual Acuity Right Eye Distance:   Left Eye Distance:   Bilateral Distance:    Right Eye Near:   Left Eye Near:    Bilateral Near:     Physical Exam Constitutional:      Appearance: Normal appearance. He is normal weight.  HENT:     Head: Normocephalic.      Comments: Dime sized macular fleshed tone lesion present on right ide of posterior scalp, unable to expel drainage, tender to palpate, no swelling noted.   Eyes:  Extraocular Movements: Extraocular movements intact.  Pulmonary:     Effort: Pulmonary effort is normal.  Musculoskeletal:        General: Normal range of motion.  Skin:    General: Skin is warm and dry.  Neurological:     General: No focal deficit present.     Mental Status: He is alert and oriented to person, place, and time. Mental status is at baseline.  Psychiatric:        Mood and Affect: Mood normal.        Behavior: Behavior normal.     UC Treatments / Results  Labs (all labs ordered are listed, but only abnormal results are displayed) Labs Reviewed - No data to display  EKG   Radiology No results found.  Procedures Procedures (including critical care time)  Medications Ordered in UC Medications - No data to display  Initial Impression / Assessment and Plan / UC Course  I have reviewed the triage vital signs and the nursing notes.  Pertinent labs & imaging results that were available during my care of the patient were reviewed by me and considered in my medical decision making (see chart for details).  Bug bite with infection, initial encounter  Due to timeline of presents, drainage noted from patient and patient medical history, will cover for infection today, prescribed doxycycline 100 mg twice daily for 7 days, recommended follow-up with PCP if area does not heal, recommended discontinuation of wound cleansing, urgent care follow-up as needed Final Clinical Impressions(s) / UC Diagnoses   Final diagnoses:  None   Discharge Instructions   None    ED Prescriptions   None    PDMP not reviewed this encounter.   Hans [name], NP 10/27/21 1427

## 2021-10-27 NOTE — ED Triage Notes (Signed)
Pt reports something bit him on right lateral side of head several week ago. Reports will have some drainage and pain from area. Put alcohol and peroxide on the area to clean it

## 2021-11-02 ENCOUNTER — Other Ambulatory Visit: Payer: Self-pay | Admitting: Cardiovascular Disease

## 2021-11-06 ENCOUNTER — Ambulatory Visit: Payer: Medicaid Other | Admitting: Cardiovascular Disease

## 2021-12-09 ENCOUNTER — Ambulatory Visit (INDEPENDENT_AMBULATORY_CARE_PROVIDER_SITE_OTHER): Payer: Medicare Other | Admitting: Podiatry

## 2021-12-09 ENCOUNTER — Encounter: Payer: Self-pay | Admitting: Podiatry

## 2021-12-09 ENCOUNTER — Other Ambulatory Visit: Payer: Self-pay

## 2021-12-09 DIAGNOSIS — D689 Coagulation defect, unspecified: Secondary | ICD-10-CM | POA: Diagnosis not present

## 2021-12-09 DIAGNOSIS — B351 Tinea unguium: Secondary | ICD-10-CM | POA: Diagnosis not present

## 2021-12-09 DIAGNOSIS — M79675 Pain in left toe(s): Secondary | ICD-10-CM | POA: Diagnosis not present

## 2021-12-09 DIAGNOSIS — I739 Peripheral vascular disease, unspecified: Secondary | ICD-10-CM

## 2021-12-09 DIAGNOSIS — M79674 Pain in right toe(s): Secondary | ICD-10-CM

## 2021-12-09 DIAGNOSIS — N1831 Chronic kidney disease, stage 3a: Secondary | ICD-10-CM

## 2021-12-09 NOTE — Progress Notes (Signed)
This patient returns to my office for at risk foot care.  This patient requires this care by a professional since this patient will be at risk due to having , PAD and  Chronic kidney disease.  .  This patient is unable to cut nails himself since the patient cannot reach his nails.These nails are painful walking and wearing shoes.  This patient presents for at risk foot care today.  General Appearance  Alert, conversant and in no acute stress.  Vascular  Dorsalis pedis and posterior tibial  pulses are palpable  bilaterally.  Capillary return is within normal limits  bilaterally. Temperature is within normal limits  bilaterally.  Neurologic  Senn-Weinstein monofilament wire test within normal limits  bilaterally. Muscle power within normal limits bilaterally.  Nails Thick disfigured discolored nails with subungual debris  from hallux to fifth toes bilaterally. No evidence of bacterial infection or drainage bilaterally.  Orthopedic  No limitations of motion  feet .  No crepitus or effusions noted.  No bony pathology or digital deformities noted.  HAV  B/L.  Skin  normotropic skin with no porokeratosis noted bilaterally.  No signs of infections or ulcers noted.     Onychomycosis  Pain in right toes  Pain in left toes  Consent was obtained for treatment procedures.   Mechanical debridement of nails 1-5  bilaterally performed with a nail nipper.  Filed with dremel without incident.    Return office visit   3 months                   Told patient to return for periodic foot care and evaluation due to potential at risk complications.   Chanee Henrickson DPM  

## 2021-12-28 DIAGNOSIS — H42 Glaucoma in diseases classified elsewhere: Secondary | ICD-10-CM | POA: Diagnosis not present

## 2021-12-28 DIAGNOSIS — I10 Essential (primary) hypertension: Secondary | ICD-10-CM | POA: Diagnosis not present

## 2021-12-28 DIAGNOSIS — N189 Chronic kidney disease, unspecified: Secondary | ICD-10-CM | POA: Diagnosis not present

## 2022-01-12 ENCOUNTER — Other Ambulatory Visit: Payer: Self-pay

## 2022-01-12 ENCOUNTER — Encounter: Payer: Self-pay | Admitting: Cardiovascular Disease

## 2022-01-12 ENCOUNTER — Ambulatory Visit (INDEPENDENT_AMBULATORY_CARE_PROVIDER_SITE_OTHER): Payer: Medicare Other | Admitting: Cardiovascular Disease

## 2022-01-12 DIAGNOSIS — Z95810 Presence of automatic (implantable) cardiac defibrillator: Secondary | ICD-10-CM | POA: Diagnosis not present

## 2022-01-12 DIAGNOSIS — I428 Other cardiomyopathies: Secondary | ICD-10-CM | POA: Diagnosis not present

## 2022-01-12 DIAGNOSIS — I495 Sick sinus syndrome: Secondary | ICD-10-CM

## 2022-01-12 DIAGNOSIS — N1831 Chronic kidney disease, stage 3a: Secondary | ICD-10-CM

## 2022-01-12 DIAGNOSIS — I480 Paroxysmal atrial fibrillation: Secondary | ICD-10-CM

## 2022-01-12 DIAGNOSIS — E785 Hyperlipidemia, unspecified: Secondary | ICD-10-CM | POA: Diagnosis not present

## 2022-01-12 DIAGNOSIS — I5042 Chronic combined systolic (congestive) and diastolic (congestive) heart failure: Secondary | ICD-10-CM | POA: Diagnosis not present

## 2022-01-12 DIAGNOSIS — Z7901 Long term (current) use of anticoagulants: Secondary | ICD-10-CM | POA: Diagnosis not present

## 2022-01-12 LAB — COMPREHENSIVE METABOLIC PANEL
ALT: 12 IU/L (ref 0–44)
AST: 17 IU/L (ref 0–40)
Albumin/Globulin Ratio: 1.2 (ref 1.2–2.2)
Albumin: 4 g/dL (ref 3.6–4.6)
Alkaline Phosphatase: 84 IU/L (ref 44–121)
BUN/Creatinine Ratio: 11 (ref 10–24)
BUN: 17 mg/dL (ref 8–27)
Bilirubin Total: 0.6 mg/dL (ref 0.0–1.2)
CO2: 25 mmol/L (ref 20–29)
Calcium: 9.4 mg/dL (ref 8.6–10.2)
Chloride: 104 mmol/L (ref 96–106)
Creatinine, Ser: 1.48 mg/dL — ABNORMAL HIGH (ref 0.76–1.27)
Globulin, Total: 3.4 g/dL (ref 1.5–4.5)
Glucose: 102 mg/dL — ABNORMAL HIGH (ref 70–99)
Potassium: 4.3 mmol/L (ref 3.5–5.2)
Sodium: 140 mmol/L (ref 134–144)
Total Protein: 7.4 g/dL (ref 6.0–8.5)
eGFR: 47 mL/min/{1.73_m2} — ABNORMAL LOW (ref >59)

## 2022-01-12 LAB — CBC
Hematocrit: 39.8 % (ref 37.5–51.0)
Hemoglobin: 13.4 g/dL (ref 13.0–17.7)
MCH: 30.5 pg (ref 26.6–33.0)
MCHC: 33.7 g/dL (ref 31.5–35.7)
MCV: 91 fL (ref 79–97)
Platelets: 133 10*3/uL — ABNORMAL LOW (ref 150–450)
RBC: 4.39 x10E6/uL (ref 4.14–5.80)
RDW: 11.8 % (ref 11.6–15.4)
WBC: 5 10*3/uL (ref 3.4–10.8)

## 2022-01-12 LAB — TSH: TSH: 1.54 u[IU]/mL (ref 0.450–4.500)

## 2022-01-12 LAB — LIPID PANEL
Chol/HDL Ratio: 2.5 ratio (ref 0.0–5.0)
Cholesterol, Total: 118 mg/dL (ref 100–199)
HDL: 47 mg/dL (ref >39)
LDL Chol Calc (NIH): 60 mg/dL (ref 0–99)
Triglycerides: 46 mg/dL (ref 0–149)
VLDL Cholesterol Cal: 11 mg/dL (ref 5–40)

## 2022-01-12 LAB — HEMOGLOBIN A1C
Est. average glucose Bld gHb Est-mCnc: 120 mg/dL
Hgb A1c MFr Bld: 5.8 % — ABNORMAL HIGH (ref 4.8–5.6)

## 2022-01-12 NOTE — Patient Instructions (Signed)
Medication Instructions:  Your physician recommends that you continue on your current medications as directed. Please refer to the Current Medication list given to you today.  *If you need a refill on your cardiac medications before your next appointment, please call your pharmacy*   Lab Work: Lipid Panel, CMET, CBC, TSH, A1c  If you have labs (blood work) drawn today and your tests are completely normal, you will receive your results only by: [place] (if you have MyChart) OR A paper copy in the mail If you have any lab test that is abnormal or we need to change your treatment, we will call you to review the results.   Testing/Procedures: Your physician has requested that you have an echocardiogram. Echocardiography is a painless test that uses sound waves to create images of your heart. It provides your doctor with information about the size and shape of your heart and how well your hearts chambers and valves are working. This procedure takes approximately one hour. There are no restrictions for this procedure. This is done at [facility] [address] - 3rd Floor - [place]   Follow-Up: At [facility], you and your health needs are our priority.  As part of our continuing mission to provide you with exceptional heart care, we have created designated Provider Care Teams.  These Care Teams include your primary Cardiologist (physician) and Advanced Practice Providers (APPs -  Physician Assistants and Nurse Practitioners) who all work together to provide you with the care you need, when you need it.  We recommend signing up for the patient portal called "MyChart".  Sign up information is provided on this After Visit Summary.  MyChart is used to connect with patients for Virtual Visits (Telemedicine).  Patients are able to view lab/test results, encounter notes, upcoming appointments, etc.  Non-urgent messages can be sent to your provider as well.   To learn  more about what you can do with MyChart, go to [email or web address].    Your next appointment:   6 month(s)  The format for your next appointment:   In Person  Provider:   Shelva Majestic, MD {

## 2022-01-12 NOTE — Progress Notes (Signed)
Cardiology Office Note    Date:  01/12/2022   ID:  [name], DOB 01-Apr-1941, MRN [id number]  PCP:  Lucianne Lei, MD  Cardiologist:  Shelva Majestic, MD   No chief complaint on file.  12 month F/U  History of Present Illness:  [name] is a 81 y.o. male  who has a  history of a dilated cardiomyopathy with remote ejection fractions in the 35-45% range. In  December 2012 he presented with acute congestive heart failure requiring BiPAP therapy and during his hospitalization ejection fraction dropped to 20-25%. He ruled in for non-ST segment elevation MI and was not found to have high-grade obstructive CAD. He did wear life-vest transiently and with improvement of his ejection fraction to approximately 35-40% in April 2013 his life as was discontinued. He also has a history of intermittent second-degree AV block lead leading to discontinuance of his beta blocker therapy and reduction of his amiodarone dose. An echo Doppler study in March 2014 revealed an ejection fraction that had increased to approximately 55%. 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   He was hospitalized in May 2016 with chest pain.  His cardiac enzymes were negative for MI.  He had an exercise Myoview study on 04/10/2015 and on the stress portion of the study he had episodes of nonsustained VT.  He did not have chest pain or diagnostic ECG changes and the result was felt to be low risk. During that hospitalization a follow-up echo Doppler study showed an ejection fraction that had improved to 45-50% with inferior hypokinesis.  There was mild LVH and grade 1 diastolic dysfunction.  During his hospitalization he did have some bradycardia.   He was evaluated in th ER on 01/01/17 for palpitaions and was felt to have an URI. He was febrile to 100.6.  Laboratory showed a Cr 1.74 which had increased from 1.37 10 months ago. He denies any chest pain.  He denies PND or orthopnea.  He is unaware of any recurrent palpitations.  He  denies significant edema.     He has noticed very rare twinges of atypical chest pain which is short-lived and nonexertional and resolve spontaneously.  He has been taking spironolactone 12.5 mg daily, furosemide 20 mg every other day, amlodipine 10 mg in addition to isosorbide 30 mg.  He denies any anginal type symptomatology.  His has not had any significant edema on this regimen.  Laboratory showed improvement in his creatinine at 1.37 from 1.74 in February 2018.  Lipid studies were excellent, not on therapy with a total cholesterol 111, triglycerides 42, HDL 50, and LDL 53.  He sees Dr. Criss Rosales at three-month intervals.     He was evaluated in the emergency room in December 2018.  He had some chest discomfort.  There were no acute findings.  Chest x-ray was negative.  Troponins were negative 2.  Subsequently, he has felt well.     When I saw him on Apr 07, 2018 he remained asymptomatic with stable blood pressure without chest pain on  amlodipine 10 mg, furosemide 20 g, isosorbide 30 mg daily in addition to spironolactone 12.5 mg daily.  His ECG at that time showed sinus rhythm with PVCs in a bigeminal rhythm for which he was asymptomatic.   He underwent an echo Doppler study on Apr 13, 2018 which showed normal LV function without wall motion abnormality, grade 2 diastolic dysfunction, mild biatrial enlargement, and mild pulmonary hypertension with a peak PA pressure 39 mm.  Laboratory in May 2019 had shown normal TSH of 1.27.  He has chronic kidney disease stage III with creatinine 1.6.  Magnesium level was normal.     During my  evaluation in July 2019 he was asymptomatic with reference to dizziness or lightheadedness but his heart rate was in the 30s and he appeared to have second-degree block intermittent junctional rhythm and I suspected significant sick sinus syndrome.  I referred him to Dr. Sallyanne Kuster for further evaluation.   He was admitted on July 12, 2018 and underwent insertion of a new  dual-chamber permanent pacemaker for symptomatic bradycardia,2nd degree AV block, and sinus node dysfunction by Dr. Sallyanne Kuster. Subsequently, when last seen by Dr. Orene Desanctis he had developed 2 very brief episodes of PAT and had a  short episode of PAF.  I evaluated him in a telemedicine visit in May 2020.  At that time he remained stable with reference to his nonischemic cardiomyopathy.  However blood pressure was elevated and I further titrated Toprol-XL to 37.5 mg. He denied any chest pain or shortness of breath.  There was no leg swelling.  He has not been able to walk with the COVID-19 pandemic.  At times he notes a rare palpitation. Of note, he was found to have an episode of PAF on remote monitoring. Dr. Sallyanne Kuster had discussed this with him and with the patient's desire not to start anticoagulation therapy the decision was made not to institute anticoagulation with his burden less than 1%.  He subsequently was evaluated in June 2020 by Roby Lofts and with recurrent PAF captured on interrogation with the longest episode lasting 4 hours and a CHA2DS2-VASc score of 5 aspirin was discontinued and he was started on Eliquis 2.5 mg twice a day with his creatinine greater than 1.5. and age of 2.  He saw Dr. Sallyanne Kuster for pacemaker follow-up on October 22, 2019.  He was feeling well and only had mild shortness of breath if he try to run or walk very fast.  Pacemaker interrogation showed sinus bradycardia with second-degree AV block with Mobitz type I with ventricular rate in the high 30s to low 40s as his underlying rhythm.  He had 89% atrial pacing and 95% ventricular pacing.  Atrial fibrillation burden remains very low at less than 1% but he had several episodes that were prolonged episodes of asymptomatic PAF longest lasting >3 hours.  I  saw him on October 25, 2019 at which time he felt well and denied any chest pain, PND orthopnea.  He denied any presyncope or syncope.    He was hospitalized from  December 15 through November 09, 2019 after presenting with sudden loss of consciousness without any prodrome of chest pain prior to the episode.  He was felt to have syncope with VT.  During that hospitalization he underwent repeat cardiac catheterization on November 07, 2019 by Dr. Fletcher Anon which showed stable 80% stenosis in the diagonal branch, unchanged from 2009.  Remotely had undergone a prior pacemaker insertion and had undergone ablation for PVCs.  During his hospitalization in December, he underwent a BiV ICD upgrade and had a subtotally occluded SCV was traversed with a Glidewire by Dr. Crissie Sickles.  He saw Dr. Lovena Le in follow-up on February 20, 2020 and he remained asymptomatic.  His chronic systolic heart failure was well controlled and he was euvolemic with class II symptoms.  He was maintaining sinus rhythm without recurrent PAF, no longer on amiodarone.   saw him on June 20, 2020. He  had sustained a left fibula metatarsal fracture earlier this year.  Apparently the patient stopped taking Eliquis in November 2020.  He denied chest pain.  At times there is some very minimal shortness of breath.  He was unaware of palpitations.   He underwent an echo Doppler study in August 2021 which showed an EF of 40 to 45% and grade 1 diastolic dysfunction. There was severe akinesis of the inferior wall.  He has seen Dr. Lamonte Sakai who is following him for a lung nodule.  He saw Dr. Sallyanne Kuster in January 2022 and  pacemaker interrogation normal device function.  I last saw him on January 12, 2021.  At that time he denied any chest pain and admitted to to some shortness of breath.  He had laboratory in December 2021 which showed total cholesterol 115 LDL cholesterol 63 triglycerides 73 and HDL 37.  Creatinine was 1.38.  He was evaluated by Roby Lofts, PA-C in August 2 for preoperative clearance prior to undergoing cystoscopy to evaluate for hematuria.  He continues to be stable without chest pain and appears  euvolemic.  He was on metoprolol succinate with his PAF history with less than 1% burden on previous interrogation.  He was last evaluated in November 2022 by Dr. Sallyanne Kuster.  At that time, due to recurrent episodes of atrial fibrillation, was further discussion regarding resumption of anticoagulation.  In the past he had issues with significant nosebleeds.  He had not had any further VT following his pacemaker upgrade to a CRT device by Dr. Lovena Le.  Presently, Mr. Nair feels well.  He denies chest pain.  If he walks fast he does experience some mild shortness of breath.  He is unaware of any episodes of presyncope or recurrent syncope.  Is not had an echo Doppler study since April 2021.  He back on low-dose Eliquis to 2.5 mg twice a day with rare occasional nosebleeds.  He is on furosemide 20 mg daily, metoprolol succinate 100 mg, spironolactone 12.5 mg twice a day and isosorbide mononitrate 30 mg.  He takes albuterol on a as needed basis.  He presents for evaluation.  Past Medical History:  Diagnosis Date   AICD (automatic cardioverter/defibrillator) present    CAD (coronary artery disease) 80% stenosis diag of the LAD, 30% in OM2 branch of LCX in 2009    a. Nonobstructive CAD by cath 11/2011 with the exception of the pre-existing diagonal branch #2 lesion.   Chronic systolic CHF (congestive heart failure) (HCC)    CKD (chronic kidney disease) stage 3, GFR 30-59 ml/min (HCC)    Colon polyp, hyperplastic    History of stress test 06/01/2012   Normal myocardial perfusion study. compared to the previous study there is no significant change. this is a low risk scan   Hypertension    Legally blind    "both eyes"   Myocardial infarction [facility]) 11/22/2011   NICM (nonischemic cardiomyopathy) ([place])    a. Remote hx of dilated NICM with EF ranging 20-45%, including normal EF by echo (55-60%) in 2014.   Peripheral arterial disease ([place])    a. 06/2014: ABI right 0.99, left 1.2, LE dopplers revealing an  occluded right posterior tibial. As symptoms were not felt r/t claudication, no further w/u at the time.   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 Requiring discontinuation of BB dose.   Spondylolisthesis    Ventricular bigeminy    Ventricular tachycardia (  paroxysmal) 04/11/2015    Past Surgical History:  Procedure Laterality Date   BACK SURGERY     BIV UPGRADE N/A 11/08/2019   Procedure: BIV UPGRADE;  Surgeon: Evans Lance, MD;  Location: [facility];  Service: Cardiovascular;  Laterality: N/A;   CARDIAC CATHETERIZATION  11/2011   CARDIAC CATHETERIZATION  11/2011   didn't demonstrate high grade obstructive disease to account for his LV dysfunction.   CATARACT EXTRACTION, BILATERAL  1990's   CYSTOSCOPY     CYSTOSCOPY WITH BIOPSY Bilateral 08/03/2021   Procedure: CYSTOSCOPY WITH BLADDER BIOPSY, BILATERAL RETROGRADE PYELOGRAM;  Surgeon: Lucas Mallow, MD;  Location: [facility];  Service: Urology;  Laterality: Bilateral;  REQUESTING [place] N/A 05/04/2013   Procedure: CYSTOSCOPY WITH URETHRAL DILATATION;  Surgeon: Eustace Moore, MD;  Location: [facility];  Service: Neurosurgery;  Laterality: N/A;  with insertion of foley catheter   EP study and ablation of VT  7/13   PVC focus mapped to the right coronary cusp of the aorta, limited ablation performed due to proximity of the focus to the right coronary artery   EYE SURGERY     LEFT HEART CATH AND CORONARY ANGIOGRAPHY N/A 11/07/2019   Procedure: LEFT HEART CATH AND CORONARY ANGIOGRAPHY;  Surgeon: Wellington Hampshire, MD;  Location: [facility];  Service: Cardiovascular;  Laterality: N/A;   LEFT HEART CATHETERIZATION WITH CORONARY ANGIOGRAM N/A 11/25/2011   Procedure: LEFT HEART CATHETERIZATION WITH CORONARY ANGIOGRAM;  Surgeon: Leonie Man, MD;  Location: [facility] C[facility] LAB;  Service: Cardiovascular;  Laterality: N/A;    PACEMAKER IMPLANT N/A 07/12/2018   Procedure: PACEMAKER IMPLANT;  Surgeon: Sanda Klein, MD;  Location: [facility];  Service: Cardiovascular;  Laterality: N/A;   POSTERIOR FUSION LUMBAR SPINE  1979   ROTATOR CUFF REPAIR  2000's   left   V-TACH ABLATION N/A 06/06/2012   Procedure: V-TACH ABLATION;  Surgeon: Thompson Grayer, MD;  Location: [facility] C[facility] LAB;  Service: Cardiovascular;  Laterality: N/A;    Current Medications: Outpatient Medications Prior to Visit  Medication Sig Dispense Refill   albuterol (VENTOLIN HFA) 108 (90 Base) MCG/ACT inhaler Inhale 1-2 puffs into the lungs every 6 (six) hours as needed for wheezing or shortness of breath.     apixaban (ELIQUIS) 2.5 MG TABS tablet Take 1 tablet (2.5 mg total) by mouth 2 (two) times daily. 180 tablet 3   BREO ELLIPTA 100-25 MCG/INH AEPB Inhale 1 puff into the lungs every morning.     dorzolamide-timolol (COSOPT) 22.3-6.8 MG/ML ophthalmic solution Place 1 drop into both eyes 2 (two) times daily.     doxycycline (VIBRAMYCIN) 100 MG capsule Take 1 capsule (100 mg total) by mouth 2 (two) times daily. 14 capsule 0   fluconazole (DIFLUCAN) 200 MG tablet Take 200 mg by mouth daily.     furosemide (LASIX) 20 MG tablet TAKE 1 TABLET(20 MG) BY MOUTH DAILY 30 tablet 11   HYDROcodone-acetaminophen (NORCO/VICODIN) 5-325 MG tablet Take 1 tablet by mouth every 4 (four) hours as needed. 10 tablet 0   isosorbide mononitrate (IMDUR) 30 MG 24 hr tablet Take 30 mg by mouth daily.     LUMIGAN 0.01 % SOLN Place 1 drop into both eyes at bedtime.   0   metoprolol succinate (TOPROL-XL) 100 MG 24 hr tablet TAKE 1 TABLET(100 MG) BY MOUTH DAILY WITH OR IMMEDIATELY FOLLOWING A MEAL 90 tablet 3   nitroGLYCERIN (NITROSTAT) 0.4 MG SL tablet  RESTASIS 0.05 % ophthalmic emulsion Place 1 drop into both eyes 2 (two) times daily.  11   spironolactone (ALDACTONE) 25 MG tablet TAKE 1/2 TABLET(12.5 MG) BY MOUTH TWICE DAILY (Patient taking differently: Take 12.5 mg by mouth  daily.) 30 tablet 6   triamcinolone ointment (KENALOG) 0.5 % Apply 1 application topically 2 (two) times daily.     No facility-administered medications prior to visit.     Allergies:   Patient has no known allergies.   Social History   Socioeconomic History   Marital status: Legally Separated    Spouse name: Not on file   Number of children: 3   Years of education: Not on file   Highest education level: Not on file  Occupational History   Occupation: Retired    Fish farm manager: RETIRED  Tobacco Use   Smoking status: Former    Years: 50.00    Types: Cigarettes    Quit date: 11/23/1999    Years since quitting: 22.1   Smokeless tobacco: Never  Vaping Use   Vaping Use: Never used  Substance and Sexual Activity   Alcohol use: No    Comment: 05/26/12 "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; stopped drinking in the early 1980's"   Drug use: No   Sexual activity: Yes  Other Topics Concern   Not on file  Social History Narrative   Not on file   Social Determinants of Health   Financial Resource Strain: Not on file  Food Insecurity: No Food Insecurity   Worried About Running Out of Food in the Last Year: Never true   [place] in the Last Year: Never true  Transportation Needs: No Transportation Needs   Lack of Transportation (Medical): No   Lack of Transportation (Non-Medical): No  Physical Activity: Not on file  Stress: Not on file  Social Connections: Not on file    Socially, he is divorced and has 4 children, and 7 grandchildren.  Family History:  The patient's family history includes Asthma in his mother; Other in an other family member.   ROS General: Negative; No fevers, chills, or night sweats;  HEENT: Negative; No changes in vision or hearing, sinus congestion, difficulty swallowing Pulmonary: Lung nodule, COPD Cardiovascular:  See HPI GI: Negative; No nausea, vomiting, diarrhea, or abdominal pain GU: Negative; No dysuria, hematuria, or difficulty voiding Musculoskeletal:  Negative; no myalgias, joint pain, or weakness Hematologic/Oncology: Negative; no easy bruising, bleeding Endocrine: Negative; no heat/cold intolerance; no diabetes Neuro: Negative; no changes in balance, headaches Skin: Negative; No rashes or skin lesions Psychiatric: Negative; No behavioral problems, depression Sleep: Negative; No snoring, daytime sleepiness, hypersomnolence, bruxism, restless legs, hypnogognic hallucinations, no cataplexy Other comprehensive 14 point system review is negative.   PHYSICAL EXAM:   VS:  BP (!) 150/72    Pulse 76    Ht _0  (1.727 m)    Wt 168 lb 3.2 oz (76.3 kg)    SpO2 96%    BMI 25.57 kg/m     Repeat blood pressure by me was 140/70.  The patient states that he had immediately walked in to the office after his last ride here was elevated since he really had not time to completely relax.  Wt Readings from Last 3 Encounters:  01/12/22 168 lb 3.2 oz (76.3 kg)  10/05/21 169 lb 3.2 oz (76.7 kg)  07/21/21 169 lb (76.7 kg)    General: Alert, oriented, no distress.  Skin: normal turgor, no rashes, warm and dry HEENT: Normocephalic, atraumatic.  Pupils equal round and reactive to light; sclera anicteric; extraocular muscles intact;  Nose without nasal septal hypertrophy Mouth/Parynx benign; Mallinpatti scale 3 Neck: No JVD, no carotid bruits; normal carotid upstroke Lungs: clear to ausculatation and percussion; no wheezing or rales Chest wall: without tenderness to palpitation Heart: PMI not displaced, RRR infrequent ectopic complex on auscultation, s1 s2 normal, 1/6 systolic murmur, no diastolic murmur, no rubs, gallops, thrills, or heaves Abdomen: soft, nontender; no hepatosplenomehaly, BS+; abdominal aorta nontender and not dilated by palpation. Back: no CVA tenderness Pulses 2+ Musculoskeletal: full range of motion, normal strength, no joint deformities Extremities: no clubbing cyanosis or edema, Homan's sign negative  Neurologic: grossly nonfocal;  Cranial nerves grossly wnl Psychologic: Normal mood and affect   Studies/Labs Reviewed:   January 12, 2022 ECG (independently read by me): AV paced at 70 bpm.  100% capture  January 12, 2021 ECG (independently read by me): AV paced rhythm at 71;   July 2021 ECG (independently read by me): AV paced rhythm with occasional V paced complexes, PVC.  PR 180 ms.  December 01/2019 ECG (independently read by me): AV paced rhythm at 70 bpm with 100% capture.  PR interval 184 ms, QTc interval 486 ms.  Recent Labs: BMP Latest Ref Rng & Units 07/16/2021 07/05/2021 03/31/2021  Glucose 70 - 99 mg/dL 101(H) 106(H) 101(H)  BUN 8 - 23 mg/dL _0 Creatinine 0.61 - 1.24 mg/dL 1.48(H) 1.68(H) 1.55(H)  BUN/Creat Ratio 10 - 24 - - -  Sodium 135 - 145 mmol/L 141 140 140  Potassium 3.5 - 5.1 mmol/L 3.4(L) 3.3(L) 3.6  Chloride 98 - 111 mmol/L 109 108 107  CO2 22 - 32 mmol/L _1 Calcium 8.9 - 10.3 mg/dL 9.5 9.3 9.2     Hepatic Function Latest Ref Rng & Units 07/05/2021 11/08/2019 04/13/2019  Total Protein 6.5 - 8.1 g/dL 7.4 6.9 7.1  Albumin 3.5 - 5.0 g/dL 3.6 3.3(L) 4.0  AST 15 - 41 U/L _2 ALT 0 - 44 U/L _3 Alk Phosphatase 38 - 126 U/L 66 54 76  Total Bilirubin 0.3 - 1.2 mg/dL 1.1 1.3(H) 0.4  Bilirubin, Direct 0.0 - 0.3 mg/dL - - -    CBC Latest Ref Rng & Units 07/16/2021 07/05/2021 03/31/2021  WBC 4.0 - 10.5 K/uL 4.7 4.3 4.4  Hemoglobin 13.0 - 17.0 g/dL 13.7 13.5 13.8  Hematocrit 39.0 - 52.0 % 42.0 40.3 42.0  Platelets 150 - 400 K/uL 135(L) 129(L) 129(L)   Lab Results  Component Value Date   MCV 93.8 07/16/2021   MCV 92.9 07/05/2021   MCV 93.8 03/31/2021   Lab Results  Component Value Date   TSH 1.520 04/13/2019   Lab Results  Component Value Date   HGBA1C 5.9 (H) 05/26/2012     BNP    Component Value Date/Time   BNP 49.9 04/09/2015 1217    ProBNP    Component Value Date/Time   PROBNP 155.4 (H) 03/19/2013 1749     Lipid Panel     Component Value  Date/Time   CHOL 107 04/13/2019 1123   TRIG 85 04/13/2019 1123   HDL 50 04/13/2019 1123   CHOLHDL 2.1 04/13/2019 1123   CHOLHDL 2.2 01/24/2017 0950   VLDL 8 01/24/2017 0950   LDLCALC 40 04/13/2019 1123   LABVLDL 17 04/13/2019 1123     RADIOLOGY: No results found.   Additional studies/ records that were reviewed today include:  Study Conclusions   -  Left ventricle: The cavity size was normal. Wall thickness was   normal. Systolic function was normal. The estimated ejection   fraction was in the range of 50% to 55%. Wall motion was normal;   there were no regional wall motion abnormalities. Features are   consistent with a pseudonormal left ventricular filling pattern,   with concomitant abnormal relaxation and increased filling   pressure (grade 2 diastolic dysfunction). - Left atrium: The atrium was mildly dilated. - Right ventricle: The cavity size was mildly dilated. - Right atrium: The atrium was mildly dilated. - Pulmonary arteries: Systolic pressure was mildly increased. PA   peak pressure: 39 mm Hg (S).   Impressions:   - Normal LV systolic function; mild diastolic dysfunction; mild   LAE; mild RAE/RVE; mild TR with mild pulmonary hypertension.   ------------------------------------------------------------------- Labs, prior tests, procedures, and surgery: Transthoracic echocardiography (04/10/2015).     EF was 45%.   ASSESSMENT:    1. Nonischemic cardiomyopathy (HCC)   2. Paroxysmal atrial fibrillation ([place])   3. Chronic combined systolic and diastolic heart failure ([place])   4. Hyperlipidemia with target LDL less than 70   5. SSS (sick sinus syndrome) (HCC)   6. Biventricular automatic implantable cardioverter defibrillator in situ   7. Stage 3a chronic kidney disease ([place])   8. On anticoagulant therapy     PLAN:  Mr Routh is an 81 year old gentleman who has a history of mild CAD, heart failure with reduced EF in December 2012 requiring BiPAP therapy  with EF at 20 to 25%.  He had worn a LifeVest transiently and EF ultimately improved to 35 to 40% in 2013 and LifeVest was discontinued.  He has had prior pacemaker insertion and also ablation for PVCs.  He has a history of paroxysmal atrial fibrillation for which he had been on Eliquis.  Apparently he had been off Eliquis since November 2020 due to recurrent nosebleeds..  He developed an episode of VT leading to syncope in December and ultimately underwent repeat catheterization which essentially was unchanged and showed stable 80% stenosis in a diagonal branch of his LAD with mild 30% narrowing in the LAD.  There was minimal plaque in the circumflex and RCA.  He underwent BiV ICD upgrade by Dr. Lovena Le subsequently has been doing well without recurrent VT or atrial fibrillation.  His last echo Doppler study was in April 2021 which showed an EF of 40 to 45% with grade 1 diastolic dysfunction, mild aortic valve sclerosis without stenosis, and akinesis of the inferior wall.  He has been euvolemic on subsequent examinations.  Due to slight increase AF burden he was started back on Eliquis and now takes reduced dose of 2.5 mg twice a day.  His ECG today shows 100% AV paced rhythm at 70 bpm.  He is not having any anginal symptomatology.  There is some mild shortness of breath with walking fast.  I am recommending he undergo a 2-year follow-up echo Doppler study for reassessment of LV function.  If LV function remains low, he may be a candidate for Jardiance or possible Entresto.  His last creatinine in August 2022 was 1.48.  Lipid studies at that time showed total cholesterol 123, HDL 46, LDL 59 and triglycerides 95.  Hemoglobin A1c was 5.8.  He will be undergoing remote defibrillator check March 2.  Sees Dr. Creta Levin plan for primary care.  I am checking complete set of fasting laboratory with a comprehensive metabolic panel, CBC, TSH, hemoglobin A1c and lipid studies.  I will contact him regarding the results of his echo  and laboratory and adjustments will be made accordingly if necessary.  Otherwise I will see him in 6 months for reassessment or sooner as needed.    Medication Adjustments/Labs and Tests Ordered: Current medicines are reviewed at length with the patient today.  Concerns regarding medicines are outlined above.  Medication changes, Labs and Tests ordered today are listed in the Patient Instructions below. Patient Instructions  Medication Instructions:  Your physician recommends that you continue on your current medications as directed. Please refer to the Current Medication list given to you today.  *If you need a refill on your cardiac medications before your next appointment, please call your pharmacy*   Lab Work: Lipid Panel, CMET, CBC, TSH, A1c  If you have labs (blood work) drawn today and your tests are completely normal, you will receive your results only by: [place] (if you have MyChart) OR A paper copy in the mail If you have any lab test that is abnormal or we need to change your treatment, we will call you to review the results.   Testing/Procedures: Your physician has requested that you have an echocardiogram. Echocardiography is a painless test that uses sound waves to create images of your heart. It provides your doctor with information about the size and shape of your heart and how well your hearts chambers and valves are working. This procedure takes approximately one hour. There are no restrictions for this procedure. This is done at [facility] [address] - 3rd Floor - Central   Follow-Up: At [facility], you and your health needs are our priority.  As part of our continuing mission to provide you with exceptional heart care, we have created designated Provider Care Teams.  These Care Teams include your primary Cardiologist (physician) and Advanced Practice Providers (APPs -  Physician Assistants and Nurse Practitioners) who all work  together to provide you with the care you need, when you need it.  We recommend signing up for the patient portal called "MyChart".  Sign up information is provided on this After Visit Summary.  MyChart is used to connect with patients for Virtual Visits (Telemedicine).  Patients are able to view lab/test results, encounter notes, upcoming appointments, etc.  Non-urgent messages can be sent to your provider as well.   To learn more about what you can do with MyChart, go to [email or web address].    Your next appointment:   6 month(s)  The format for your next appointment:   In Person  Provider:   Shelva Majestic, MD {    Signed, Shelva Majestic, MD  01/12/2022 [facility] [address], [place], [place], [place]  [address] Phone: [phone]

## 2022-01-26 ENCOUNTER — Ambulatory Visit (HOSPITAL_COMMUNITY): Payer: Medicare Other | Attending: Cardiovascular Disease

## 2022-01-26 ENCOUNTER — Other Ambulatory Visit: Payer: Self-pay

## 2022-01-26 DIAGNOSIS — I428 Other cardiomyopathies: Secondary | ICD-10-CM | POA: Diagnosis not present

## 2022-01-26 LAB — ECHOCARDIOGRAM COMPLETE
Area-P 1/2: 4.11 cm2
S' Lateral: 4.7 cm

## 2022-02-19 DIAGNOSIS — Z1152 Encounter for screening for COVID-19: Secondary | ICD-10-CM | POA: Diagnosis not present

## 2022-02-26 DIAGNOSIS — Z1152 Encounter for screening for COVID-19: Secondary | ICD-10-CM | POA: Diagnosis not present

## 2022-03-02 DIAGNOSIS — H04123 Dry eye syndrome of bilateral lacrimal glands: Secondary | ICD-10-CM | POA: Diagnosis not present

## 2022-03-02 DIAGNOSIS — H524 Presbyopia: Secondary | ICD-10-CM | POA: Diagnosis not present

## 2022-03-02 DIAGNOSIS — H401133 Primary open-angle glaucoma, bilateral, severe stage: Secondary | ICD-10-CM | POA: Diagnosis not present

## 2022-03-02 DIAGNOSIS — H47013 Ischemic optic neuropathy, bilateral: Secondary | ICD-10-CM | POA: Diagnosis not present

## 2022-03-07 DIAGNOSIS — Z1152 Encounter for screening for COVID-19: Secondary | ICD-10-CM | POA: Diagnosis not present

## 2022-03-09 IMAGING — CT CT RENAL STONE PROTOCOL
2 of 4 series · 16 of 46 positions shown, 18 images · non-contrast
Comparison: CT abdomen pelvis dated 03/10/2016.

CLINICAL DATA: 79-year-old male with hematuria.

EXAM:
CT ABDOMEN AND PELVIS WITHOUT CONTRAST
TECHNIQUE: Multidetector CT imaging of the abdomen and pelvis was performed
following the standard protocol without IV contrast.

[Series 3: ap without · axial · non-contrast · 0.75mm/px · z∈[-425,-75]mm · 13 of 80 slices shown, 15 images]
[im 5/80  soft-tissue]
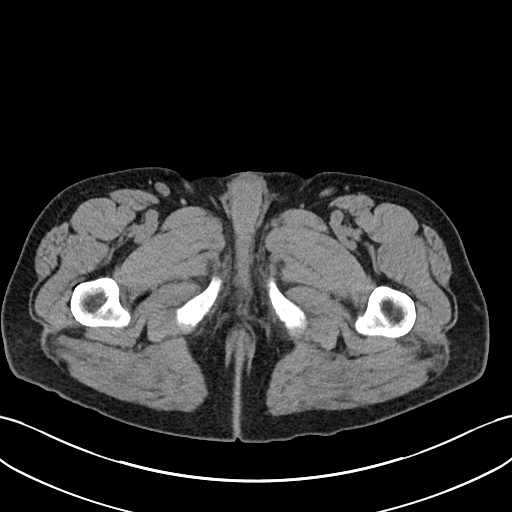
[im 5/80  bone]
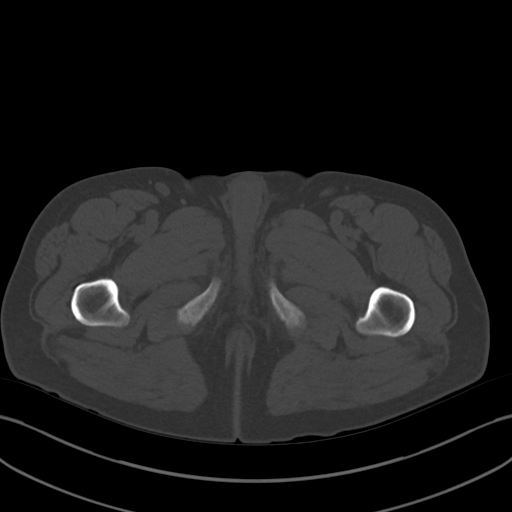
[im 13/80  soft-tissue]
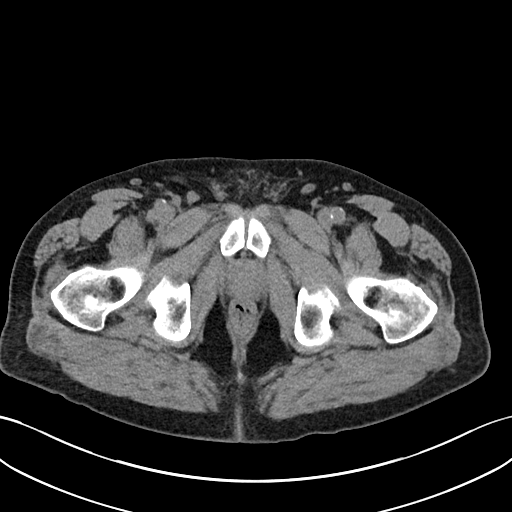
[im 17/80  soft-tissue]
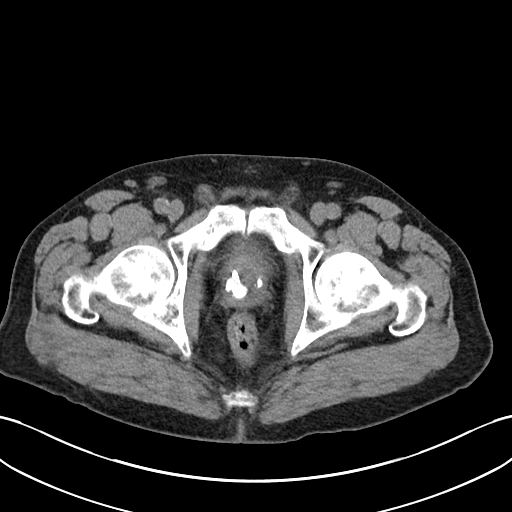
[im 21/80  soft-tissue]
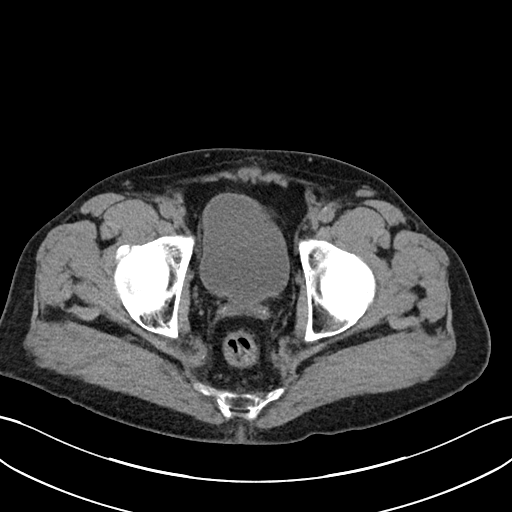
[im 30/80  soft-tissue]
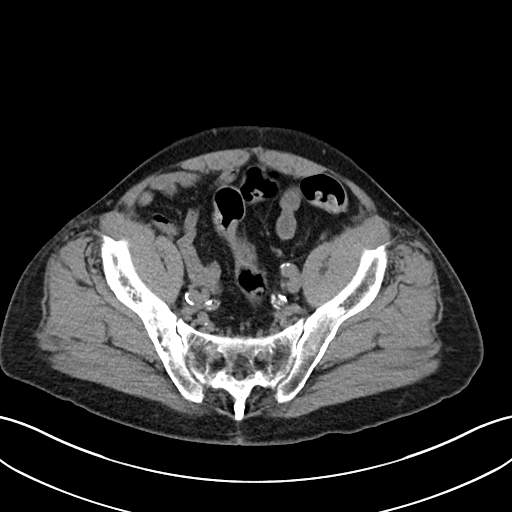
[im 34/80  soft-tissue]
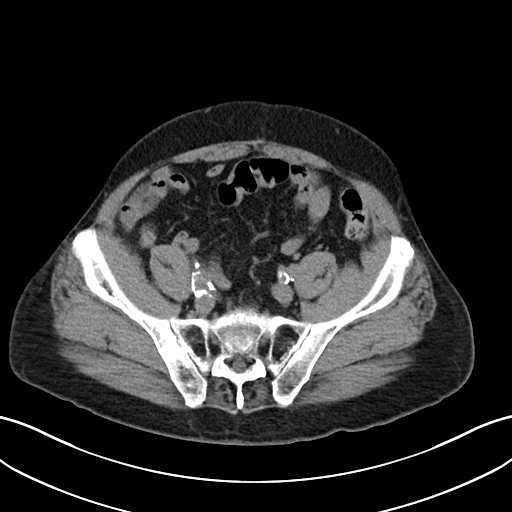
[im 42/80  soft-tissue]
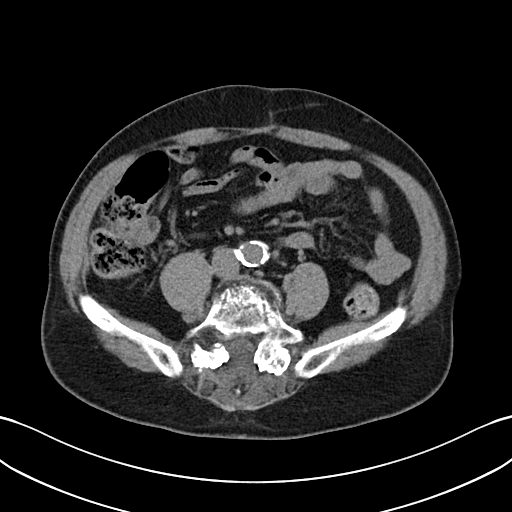
[im 46/80  soft-tissue]
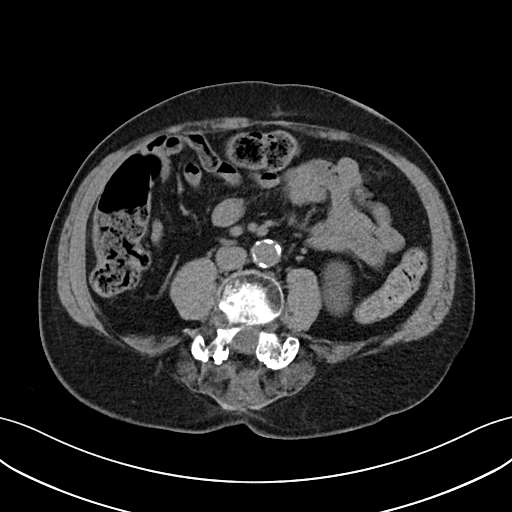
[im 50/80  soft-tissue]
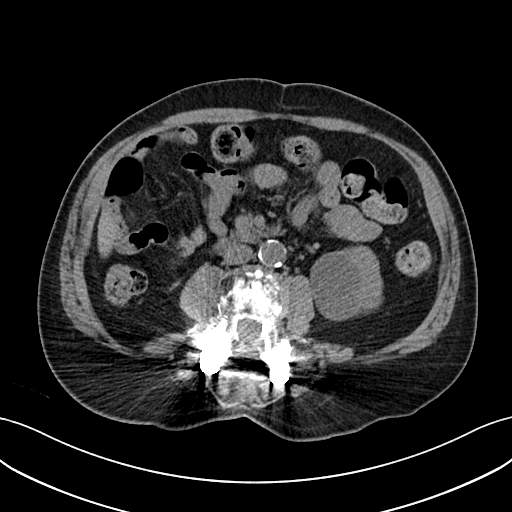
[im 50/80  bone]
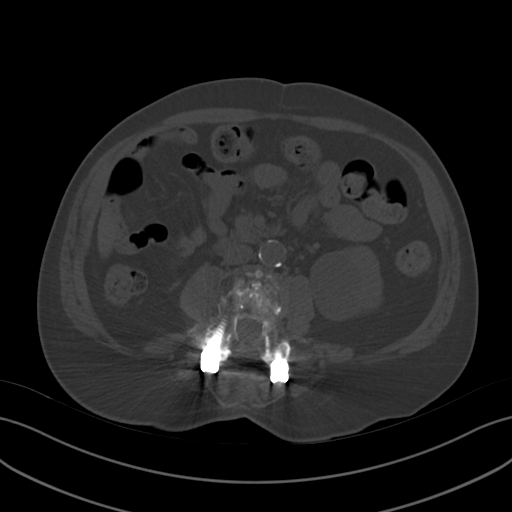
[im 59/80  soft-tissue]
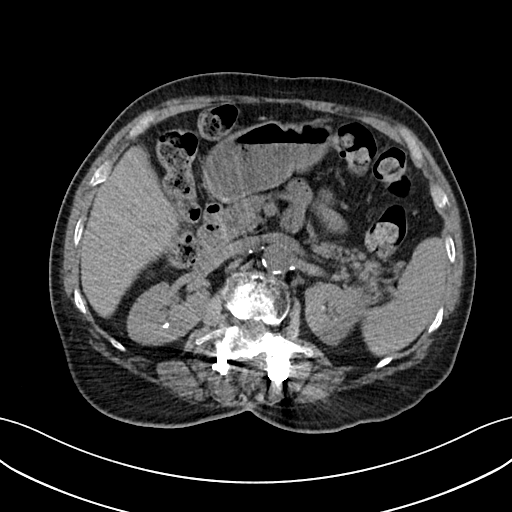
[im 63/80  soft-tissue]
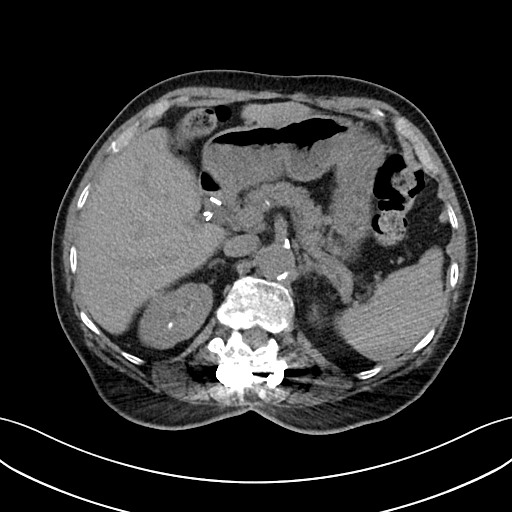
[im 67/80  soft-tissue]
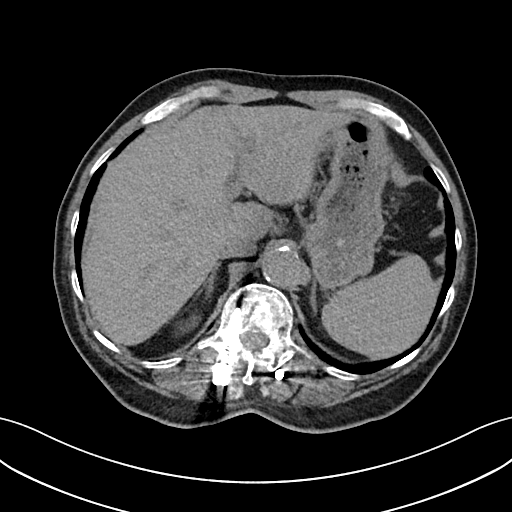
[im 75/80  soft-tissue]
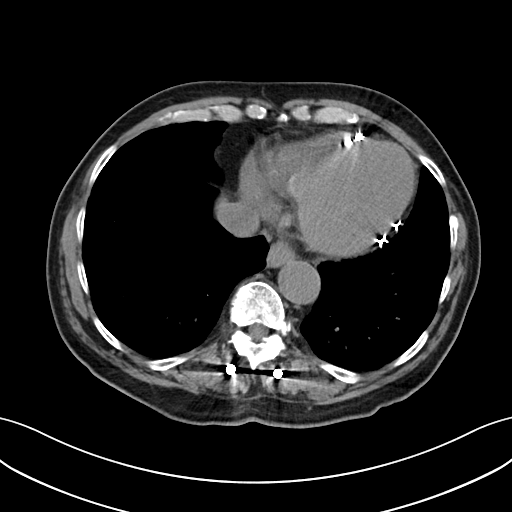

[Series 6: cor · coronal · 0.70mm/px · 3 of 96 slices shown]
[im 32/96  soft-tissue]
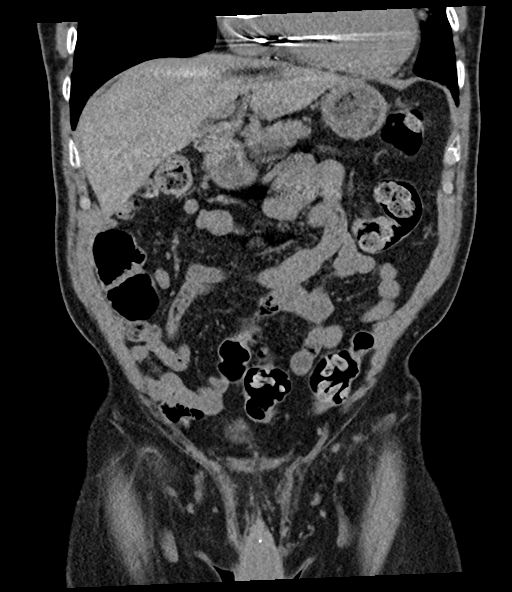
[im 43/96  soft-tissue]
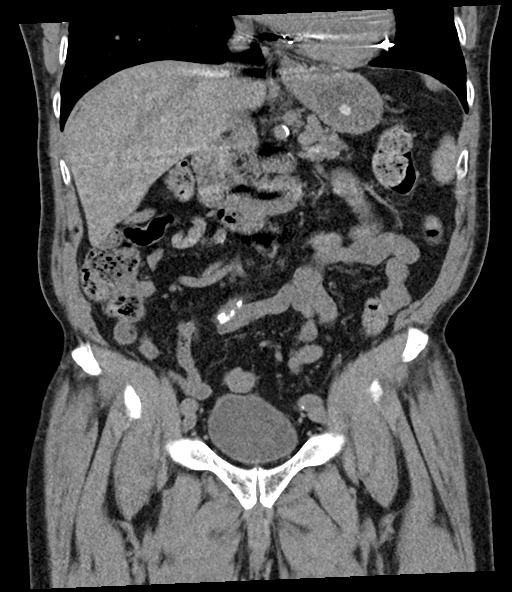
[im 53/96  soft-tissue]
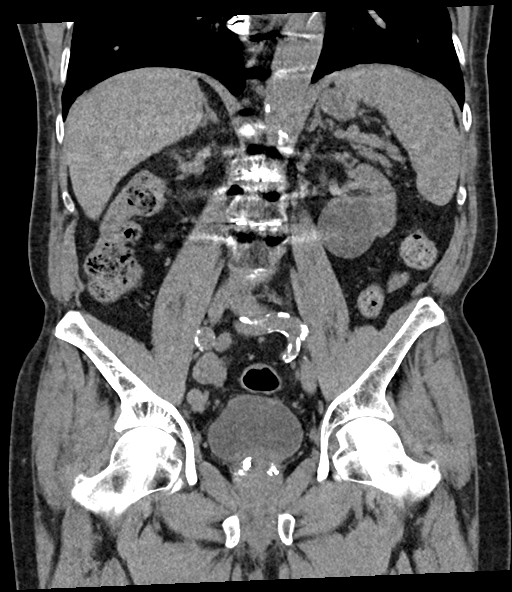

[16 of 46 positions shown; findings below may reference images not displayed]

FINDINGS: Evaluation of this exam is limited in the absence of intravenous
contrast. Evaluation is also limited due to streak artifact caused
by spinal hardware.

Lower chest: Focal area of hazy density in the lingula, likely
atelectasis or scarring. Developing infiltrate is less likely but
not excluded. Clinical correlation is recommended. Cardiac wires
noted.

No intra-abdominal free air or free fluid.

Hepatobiliary: Mild irregularity of the liver contour may represent
early changes of cirrhosis. Clinical correlation is recommended. No
intrahepatic biliary ductal dilatation. Cholecystectomy.

Pancreas: Unremarkable. No pancreatic ductal dilatation or
surrounding inflammatory changes.

Spleen: Normal in size without focal abnormality.

Adrenals/Urinary Tract: The adrenal glands are unremarkable.
Multiple nonobstructing bilateral renal calculi measure up to 7 mm
in the upper pole of the right kidney. There is no hydronephrosis on
either side. There is a 5 cm left renal inferior pole cystic lesion
as well as a smaller cystic lesion in the interpolar aspect of the
right kidney. These are suboptimally characterized on this
noncontrast CT. The visualized ureters and urinary bladder appear
unremarkable.

Stomach/Bowel: There is moderate stool throughout the colon. Several
scattered colonic diverticula without active inflammatory changes.
There is no bowel obstruction or active inflammation. The appendix
is normal.

Vascular/Lymphatic: Advanced aortoiliac atherosclerotic disease. The
IVC is grossly unremarkable. No portal venous gas. There is no
adenopathy.

Reproductive: The prostate and seminal vesicles are grossly
unremarkable. Dystrophic calcification of the prostate gland noted.

Other: Midline vertical anterior pelvic wall incisional scar.

Musculoskeletal: Degenerative changes of the spine and osteopenia.
Postsurgical changes of lower lumbar laminectomy. Lumbar fusion
hardware as seen previously. No acute osseous pathology.
IMPRESSION: 1. Nonobstructing bilateral renal calculi. No hydronephrosis.
2. Moderate colonic stool burden. No bowel obstruction. Normal
appendix.
3. Aortic Atherosclerosis ([id number]).

## 2022-03-10 ENCOUNTER — Encounter: Payer: Self-pay | Admitting: Podiatry

## 2022-03-10 ENCOUNTER — Ambulatory Visit (INDEPENDENT_AMBULATORY_CARE_PROVIDER_SITE_OTHER): Payer: Medicare Other | Admitting: Podiatry

## 2022-03-10 DIAGNOSIS — B351 Tinea unguium: Secondary | ICD-10-CM | POA: Diagnosis not present

## 2022-03-10 DIAGNOSIS — M79675 Pain in left toe(s): Secondary | ICD-10-CM

## 2022-03-10 DIAGNOSIS — M79674 Pain in right toe(s): Secondary | ICD-10-CM | POA: Diagnosis not present

## 2022-03-10 DIAGNOSIS — D689 Coagulation defect, unspecified: Secondary | ICD-10-CM

## 2022-03-10 DIAGNOSIS — N1831 Chronic kidney disease, stage 3a: Secondary | ICD-10-CM

## 2022-03-10 DIAGNOSIS — I739 Peripheral vascular disease, unspecified: Secondary | ICD-10-CM

## 2022-03-10 NOTE — Progress Notes (Signed)
This patient returns to my office for at risk foot care.  This patient requires this care by a professional since this patient will be at risk due to having , PAD and  Chronic kidney disease.  .  This patient is unable to cut nails himself since the patient cannot reach his nails.These nails are painful walking and wearing shoes.  This patient presents for at risk foot care today.  General Appearance  Alert, conversant and in no acute stress.  Vascular  Dorsalis pedis and posterior tibial  pulses are palpable  bilaterally.  Capillary return is within normal limits  bilaterally. Temperature is within normal limits  bilaterally.  Neurologic  Senn-Weinstein monofilament wire test within normal limits  bilaterally. Muscle power within normal limits bilaterally.  Nails Thick disfigured discolored nails with subungual debris  from hallux to fifth toes bilaterally. No evidence of bacterial infection or drainage bilaterally.  Orthopedic  No limitations of motion  feet .  No crepitus or effusions noted.  No bony pathology or digital deformities noted.  HAV  B/L.  Skin  normotropic skin with no porokeratosis noted bilaterally.  No signs of infections or ulcers noted.     Onychomycosis  Pain in right toes  Pain in left toes  Consent was obtained for treatment procedures.   Mechanical debridement of nails 1-5  bilaterally performed with a nail nipper.  Filed with dremel without incident.    Return office visit   3 months                   Told patient to return for periodic foot care and evaluation due to potential at risk complications.   Relena Ivancic DPM  

## 2022-03-13 DIAGNOSIS — Z1152 Encounter for screening for COVID-19: Secondary | ICD-10-CM | POA: Diagnosis not present

## 2022-03-19 DIAGNOSIS — Z1152 Encounter for screening for COVID-19: Secondary | ICD-10-CM | POA: Diagnosis not present

## 2022-03-21 DIAGNOSIS — E785 Hyperlipidemia, unspecified: Secondary | ICD-10-CM | POA: Diagnosis not present

## 2022-03-21 DIAGNOSIS — I130 Hypertensive heart and chronic kidney disease with heart failure and stage 1 through stage 4 chronic kidney disease, or unspecified chronic kidney disease: Secondary | ICD-10-CM | POA: Diagnosis not present

## 2022-03-26 DIAGNOSIS — Z1152 Encounter for screening for COVID-19: Secondary | ICD-10-CM | POA: Diagnosis not present

## 2022-04-05 DIAGNOSIS — E1169 Type 2 diabetes mellitus with other specified complication: Secondary | ICD-10-CM | POA: Diagnosis not present

## 2022-04-05 DIAGNOSIS — N1831 Chronic kidney disease, stage 3a: Secondary | ICD-10-CM | POA: Diagnosis not present

## 2022-04-05 DIAGNOSIS — I495 Sick sinus syndrome: Secondary | ICD-10-CM | POA: Diagnosis not present

## 2022-04-05 DIAGNOSIS — I5042 Chronic combined systolic (congestive) and diastolic (congestive) heart failure: Secondary | ICD-10-CM | POA: Diagnosis not present

## 2022-04-05 DIAGNOSIS — Z0000 Encounter for general adult medical examination without abnormal findings: Secondary | ICD-10-CM | POA: Diagnosis not present

## 2022-04-05 DIAGNOSIS — Z7901 Long term (current) use of anticoagulants: Secondary | ICD-10-CM | POA: Diagnosis not present

## 2022-04-05 DIAGNOSIS — I130 Hypertensive heart and chronic kidney disease with heart failure and stage 1 through stage 4 chronic kidney disease, or unspecified chronic kidney disease: Secondary | ICD-10-CM | POA: Diagnosis not present

## 2022-04-05 DIAGNOSIS — I739 Peripheral vascular disease, unspecified: Secondary | ICD-10-CM | POA: Diagnosis not present

## 2022-04-05 DIAGNOSIS — E785 Hyperlipidemia, unspecified: Secondary | ICD-10-CM | POA: Diagnosis not present

## 2022-04-05 DIAGNOSIS — I480 Paroxysmal atrial fibrillation: Secondary | ICD-10-CM | POA: Diagnosis not present

## 2022-04-05 DIAGNOSIS — I428 Other cardiomyopathies: Secondary | ICD-10-CM | POA: Diagnosis not present

## 2022-04-10 DIAGNOSIS — Z1152 Encounter for screening for COVID-19: Secondary | ICD-10-CM | POA: Diagnosis not present

## 2022-04-16 DIAGNOSIS — Z1152 Encounter for screening for COVID-19: Secondary | ICD-10-CM | POA: Diagnosis not present

## 2022-04-21 DIAGNOSIS — I130 Hypertensive heart and chronic kidney disease with heart failure and stage 1 through stage 4 chronic kidney disease, or unspecified chronic kidney disease: Secondary | ICD-10-CM | POA: Diagnosis not present

## 2022-04-21 DIAGNOSIS — E785 Hyperlipidemia, unspecified: Secondary | ICD-10-CM | POA: Diagnosis not present

## 2022-04-28 ENCOUNTER — Telehealth: Payer: Self-pay | Admitting: Cardiovascular Disease

## 2022-04-28 MED ORDER — NITROGLYCERIN 0.4 MG SL SUBL: 0.4000 mg | SUBLINGUAL_TABLET | SUBLINGUAL | 1 refills | Status: DC | PRN

## 2022-04-28 NOTE — Telephone Encounter (Signed)
*  STAT* If patient is at the pharmacy, call can be transferred to refill team.   1. Which medications need to be refilled? (please list name of each medication and dose if known) nitroGLYCERIN (NITROSTAT) 0.4 MG SL tablet  2. Which pharmacy/location (including street and city if local pharmacy) is medication to be sent to? [facility], [place] - [address] AT [place]  3. Do they need a 30 day or 90 day supply? [place]

## 2022-05-04 DIAGNOSIS — Z1152 Encounter for screening for COVID-19: Secondary | ICD-10-CM | POA: Diagnosis not present

## 2022-05-13 ENCOUNTER — Encounter (HOSPITAL_COMMUNITY): Payer: Self-pay

## 2022-05-13 ENCOUNTER — Ambulatory Visit (HOSPITAL_COMMUNITY)
Admission: EM | Admit: 2022-05-13 | Discharge: 2022-05-13 | Disposition: A | Discharge status: Home / Self Care | Payer: Medicare Other | Attending: Physician Assistant | Admitting: Physician Assistant

## 2022-05-13 DIAGNOSIS — W57XXXA Bitten or stung by nonvenomous insect and other nonvenomous arthropods, initial encounter: Secondary | ICD-10-CM | POA: Diagnosis not present

## 2022-05-13 DIAGNOSIS — L089 Local infection of the skin and subcutaneous tissue, unspecified: Secondary | ICD-10-CM

## 2022-05-13 MED ORDER — DOXYCYCLINE HYCLATE 100 MG PO CAPS: 100.0000 mg | ORAL_CAPSULE | Freq: Two times a day (BID) | ORAL | 0 refills | Status: DC

## 2022-05-13 MED ORDER — TRIAMCINOLONE ACETONIDE 0.025 % EX OINT: 1.0000 | TOPICAL_OINTMENT | Freq: Two times a day (BID) | CUTANEOUS | 0 refills | Status: DC

## 2022-05-13 NOTE — Discharge Instructions (Signed)
Return if any problems.

## 2022-05-13 NOTE — ED Triage Notes (Signed)
Onset 1 day ago, pt noticed bumps on his left leg and hip. Pt reports sitting on a throw away rug last night when he noticed the bites.

## 2022-05-13 NOTE — ED Provider Notes (Signed)
[name]    CSN: [id number] Arrival date & time: 05/13/22  1214      History   Chief Complaint Chief Complaint  Patient presents with   Insect Bite    HPI [name] is a 81 y.o. male.   Pt complains of a rash after insect bites.  Pt reports areas itch.  Pt has scratched the areas.  Pt does not know what bit him.     The history is provided by the patient. No language interpreter was used.    Past Medical History:  Diagnosis Date   AICD (automatic cardioverter/defibrillator) present    CAD (coronary artery disease) 80% stenosis diag of the LAD, 30% in OM2 branch of LCX in 2009    a. Nonobstructive CAD by cath 11/2011 with the exception of the pre-existing diagonal branch #2 lesion.   Chronic systolic CHF (congestive heart failure) (HCC)    CKD (chronic kidney disease) stage 3, GFR 30-59 ml/min (HCC)    Colon polyp, hyperplastic    History of stress test 06/01/2012   Normal myocardial perfusion study. compared to the previous study there is no significant change. 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 Remote hx of dilated NICM with EF ranging 20-45%, including normal EF by echo (55-60%) in 2014.   Peripheral arterial disease ([place])    a. 06/2014: ABI right 0.99, left 1.2, LE dopplers revealing an occluded right posterior tibial. As symptoms were not felt r/t claudication, no further w/u at the time.   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 Requiring discontinuation of BB dose.   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    Patient Active Problem List   Diagnosis Date Noted   Abnormal CT of the chest 09/04/2020   COPD (chronic obstructive pulmonary disease) ([place]) 09/04/2020   ICD (implantable  cardioverter-defibrillator) in place    Cardiac syncope 11/07/2019   Closed fracture of distal fibula 11/07/2019   Pain due to onychomycosis of toenails of both feet 05/16/2019   Coagulation disorder ([place]) 05/16/2019   Paroxysmal atrial fibrillation ([place]) 08/02/2018   NSVT (nonsustained ventricular tachycardia) ([place]) 08/02/2018   Symptomatic bradycardia 07/12/2018   Pacemaker 07/12/2018   Tachycardia-bradycardia syndrome ([place])    Unsteady gait 03/12/2016   Nausea vomiting and diarrhea 03/11/2016   Acute lower UTI 03/11/2016   Gastroenteritis 03/11/2016   Obstipation 03/11/2016   Abdominal pain    Constipation    Lactic acidosis    Leg pain    Essential hypertension 03/04/2016   Ventricular tachycardia (paroxysmal) ([place]) 04/11/2015   Chest pain with moderate risk for cardiac etiology 04/09/2015   CAP (community acquired pneumonia) 02/18/2015   Chest pain at rest 02/18/2015   Peripheral arterial disease ([place]) 08/15/2014   Hyperlipidemia with target LDL less than 70 01/01/2014   GERD (gastroesophageal reflux disease) 01/01/2014   First degree AV block 07/31/2012   Unstable angina, negative MI 06/05/2012   Second degree AV block, Mobitz type I 05/27/2012   CKD (chronic kidney disease) stage 3, GFR 30-59 ml/min ([place]) 05/27/2012   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; 20% January 2013 35% April 2013 - normalized in 2014 05/26/2012   Chronic diastolic CHF (congestive heart failure) (  [place]) 11/23/2011    Class: Diagnosis of   Coronary artery disease involving native coronary artery of native heart without angina pectoris 11/22/2011   Premature ventricular contractions 11/22/2011   Esophageal reflux 07/28/2011    Past Surgical History:  Procedure Laterality Date   BACK SURGERY     BIV UPGRADE N/A 11/08/2019   Procedure: BIV UPGRADE;  Surgeon: Evans Lance, MD;  Location: [facility];  Service: Cardiovascular;  Laterality: N/A;   CARDIAC CATHETERIZATION   11/2011   CARDIAC CATHETERIZATION  11/2011   didn't demonstrate high grade obstructive disease to account for his LV dysfunction.   CATARACT EXTRACTION, BILATERAL  1990's   CYSTOSCOPY     CYSTOSCOPY WITH BIOPSY Bilateral 08/03/2021   Procedure: CYSTOSCOPY WITH BLADDER BIOPSY, BILATERAL RETROGRADE PYELOGRAM;  Surgeon: Lucas Mallow, MD;  Location: [facility];  Service: Urology;  Laterality: Bilateral;  REQUESTING [place] N/A 05/04/2013   Procedure: CYSTOSCOPY WITH URETHRAL DILATATION;  Surgeon: Eustace Moore, MD;  Location: [facility];  Service: Neurosurgery;  Laterality: N/A;  with insertion of foley catheter   EP study and ablation of VT  7/13   PVC focus mapped to the right coronary cusp of the aorta, limited ablation performed due to proximity of the focus to the right coronary artery   EYE SURGERY     LEFT HEART CATH AND CORONARY ANGIOGRAPHY N/A 11/07/2019   Procedure: LEFT HEART CATH AND CORONARY ANGIOGRAPHY;  Surgeon: [place] Hampshire, MD;  Location: [facility];  Service: Cardiovascular;  Laterality: N/A;   LEFT HEART CATHETERIZATION WITH CORONARY ANGIOGRAM N/A 11/25/2011   Procedure: LEFT HEART CATHETERIZATION WITH CORONARY ANGIOGRAM;  Surgeon: Leonie Man, MD;  Location: [facility] CATH LAB;  Service: Cardiovascular;  Laterality: N/A;   PACEMAKER IMPLANT N/A 07/12/2018   Procedure: PACEMAKER IMPLANT;  Surgeon: Sanda Klein, MD;  Location: [facility];  Service: Cardiovascular;  Laterality: N/A;   POSTERIOR FUSION LUMBAR SPINE  1979   ROTATOR CUFF REPAIR  2000's   left   V-TACH ABLATION N/A 06/06/2012   Procedure: V-TACH ABLATION;  Surgeon: Thompson Grayer, MD;  Location: [facility] CATH LAB;  Service: Cardiovascular;  Laterality: N/A;       Home Medications    Prior to Admission medications   Medication Sig Start Date End Date Taking? Authorizing Provider  doxycycline (VIBRAMYCIN) 100 MG capsule Take 1 capsule (100 mg total) by mouth 2  (two) times daily. 05/13/22  Yes Caryl Ada K, PA-C  triamcinolone (KENALOG) 0.025 % ointment Apply 1 Application topically 2 (two) times daily. 05/13/22  Yes Caryl Ada K, PA-C  albuterol (VENTOLIN HFA) 108 (90 Base) MCG/ACT inhaler Inhale 1-2 puffs into the lungs every 6 (six) hours as needed for wheezing or shortness of breath.    [provider]  apixaban (ELIQUIS) 2.5 MG TABS tablet Take 1 tablet (2.5 mg total) by mouth 2 (two) times daily. 10/08/21   Croitoru, Mihai, MD  BREO ELLIPTA 100-25 MCG/INH AEPB Inhale 1 puff into the lungs every morning. 05/07/20   [provider]  dorzolamide-timolol (COSOPT) 22.3-6.8 MG/ML ophthalmic solution Place 1 drop into both eyes 2 (two) times daily. 03/14/20   [provider]  fluconazole (DIFLUCAN) 200 MG tablet Take 200 mg by mouth daily. 07/13/21   [provider]  furosemide (LASIX) 20 MG tablet TAKE 1 TABLET(20 MG) BY MOUTH DAILY 06/18/21   Troy Sine, MD  HYDROcodone-acetaminophen (NORCO/VICODIN) [phone]  MG tablet Take 1 tablet by mouth every 4 (four) hours as needed. 12/19/19   Rancour, Annie Main, MD  isosorbide mononitrate (IMDUR) 30 MG 24 hr tablet Take 30 mg by mouth daily. 02/24/20   [provider]  LUMIGAN 0.01 % SOLN Place 1 drop into both eyes at bedtime.  12/06/14   [provider]  metoprolol succinate (TOPROL-XL) 100 MG 24 hr tablet TAKE 1 TABLET(100 MG) BY MOUTH DAILY WITH OR IMMEDIATELY FOLLOWING A MEAL 11/02/21   Troy Sine, MD  nitroGLYCERIN (NITROSTAT) 0.4 MG SL tablet Place 1 tablet (0.4 mg total) under the tongue every 5 (five) minutes as needed for chest pain. 04/28/22   Troy Sine, MD  RESTASIS 0.05 % ophthalmic emulsion Place 1 drop into both eyes 2 (two) times daily. 12/30/14   [provider]  spironolactone (ALDACTONE) 25 MG tablet TAKE 1/2 TABLET(12.5 MG) BY MOUTH TWICE DAILY Patient taking differently: Take 12.5 mg by mouth daily. 05/13/21   Troy Sine, MD     Family History Family History  Problem Relation Age of Onset   Asthma Mother    Other Other        Unsure if any heart disease in his family.    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   Review of Systems Review of Systems  Skin:  Positive for wound.  All other systems reviewed and are negative.    Physical Exam Triage Vital Signs ED Triage Vitals [05/13/22 1226]  Enc Vitals Group     BP (!) 165/92     Pulse Rate 81     Resp 18     Temp 97.9 F (36.6 C)     Temp Source Oral     SpO2 100 %     Weight      Height      Head Circumference      Peak Flow      Pain Score      Pain Loc      Pain Edu?      Excl. in [place]?    No data found.  Updated Vital Signs BP (!) 165/92 (BP Location: Right Arm)   Pulse 81   Temp 97.9 F (36.6 C) (Oral)   Resp 18   SpO2 100%   Visual Acuity Right Eye Distance:   Left Eye Distance:   Bilateral Distance:    Right Eye Near:   Left Eye Near:    Bilateral Near:     Physical Exam Vitals reviewed.  Constitutional:      Appearance: Normal appearance.  Cardiovascular:     Rate and Rhythm: Normal rate.  Pulmonary:     Effort: Pulmonary effort is normal.  Skin:    Comments: Erythematous area right side approximately 2 cm abrasion.  Lower extremities multiple 3 to 4 mm erythematous areas.  Patient has multiple scratches  Neurological:     General: No focal deficit present.     Mental Status: He is alert.  Psychiatric:        Mood and Affect: Mood normal.      UC Treatments / Results  Labs (all labs ordered are listed, but only abnormal results  are displayed) Labs Reviewed - No data to display  EKG   Radiology No results found.  Procedures Procedures (including critical care time)  Medications Ordered in UC Medications - No data to display  Initial Impression / Assessment and Plan / UC Course  I have reviewed the triage vital signs and the nursing notes.  Pertinent labs & imaging results that were available during my care of the patient were reviewed by me and considered in my medical decision making (see chart for details).     Patient is given triamcinolone for rash.  I am going to start him on doxycycline as some of these areas are reddened and appear to becoming did Final Clinical Impressions(s) / UC Diagnoses   Final diagnoses:  Insect bite, unspecified site, initial encounter  Skin infection     Discharge Instructions      Return if any problems.     ED Prescriptions     Medication Sig Dispense Auth. Provider   doxycycline (VIBRAMYCIN) 100 MG capsule Take 1 capsule (100 mg total) by mouth 2 (two) times daily. 20 capsule Minahil Quinlivan K, [place]   triamcinolone (KENALOG) 0.025 % ointment Apply 1 Application topically 2 (two) times daily. 30 g Fransico Meadow, [place]      PDMP not reviewed this encounter. An After Visit Summary was printed and given to the patient.    Fransico Meadow, [place] 05/13/22 1338

## 2022-05-21 DIAGNOSIS — E785 Hyperlipidemia, unspecified: Secondary | ICD-10-CM | POA: Diagnosis not present

## 2022-05-21 DIAGNOSIS — I130 Hypertensive heart and chronic kidney disease with heart failure and stage 1 through stage 4 chronic kidney disease, or unspecified chronic kidney disease: Secondary | ICD-10-CM | POA: Diagnosis not present

## 2022-05-23 ENCOUNTER — Other Ambulatory Visit: Payer: Self-pay | Admitting: Cardiovascular Disease

## 2022-05-28 DIAGNOSIS — Z1152 Encounter for screening for COVID-19: Secondary | ICD-10-CM | POA: Diagnosis not present

## 2022-06-03 DIAGNOSIS — Z1152 Encounter for screening for COVID-19: Secondary | ICD-10-CM | POA: Diagnosis not present

## 2022-06-09 ENCOUNTER — Encounter: Payer: Self-pay | Admitting: Podiatry

## 2022-06-09 ENCOUNTER — Ambulatory Visit (INDEPENDENT_AMBULATORY_CARE_PROVIDER_SITE_OTHER): Payer: Medicare Other | Admitting: Podiatry

## 2022-06-09 DIAGNOSIS — M79674 Pain in right toe(s): Secondary | ICD-10-CM

## 2022-06-09 DIAGNOSIS — I739 Peripheral vascular disease, unspecified: Secondary | ICD-10-CM

## 2022-06-09 DIAGNOSIS — M79675 Pain in left toe(s): Secondary | ICD-10-CM | POA: Diagnosis not present

## 2022-06-09 DIAGNOSIS — N1831 Chronic kidney disease, stage 3a: Secondary | ICD-10-CM

## 2022-06-09 DIAGNOSIS — D689 Coagulation defect, unspecified: Secondary | ICD-10-CM | POA: Diagnosis not present

## 2022-06-09 DIAGNOSIS — B351 Tinea unguium: Secondary | ICD-10-CM

## 2022-06-09 NOTE — Progress Notes (Signed)
This patient returns to my office for at risk foot care.  This patient requires this care by a professional since this patient will be at risk due to having , PAD and  Chronic kidney disease.  .  This patient is unable to cut nails himself since the patient cannot reach his nails.These nails are painful walking and wearing shoes.  This patient presents for at risk foot care today.  General Appearance  Alert, conversant and in no acute stress.  Vascular  Dorsalis pedis and posterior tibial  pulses are palpable  bilaterally.  Capillary return is within normal limits  bilaterally. Temperature is within normal limits  bilaterally.  Neurologic  Senn-Weinstein monofilament wire test within normal limits  bilaterally. Muscle power within normal limits bilaterally.  Nails Thick disfigured discolored nails with subungual debris  from hallux to fifth toes bilaterally. No evidence of bacterial infection or drainage bilaterally.  Orthopedic  No limitations of motion  feet .  No crepitus or effusions noted.  No bony pathology or digital deformities noted.  HAV  B/L.  Skin  normotropic skin with no porokeratosis noted bilaterally.  No signs of infections or ulcers noted.     Onychomycosis  Pain in right toes  Pain in left toes  Consent was obtained for treatment procedures.   Mechanical debridement of nails 1-5  bilaterally performed with a nail nipper.  Filed with dremel without incident.    Return office visit   3 months                   Told patient to return for periodic foot care and evaluation due to potential at risk complications.   Brooklyn Jeff DPM  

## 2022-06-21 DIAGNOSIS — I5032 Chronic diastolic (congestive) heart failure: Secondary | ICD-10-CM | POA: Diagnosis not present

## 2022-06-21 DIAGNOSIS — I130 Hypertensive heart and chronic kidney disease with heart failure and stage 1 through stage 4 chronic kidney disease, or unspecified chronic kidney disease: Secondary | ICD-10-CM | POA: Diagnosis not present

## 2022-06-21 DIAGNOSIS — N1831 Chronic kidney disease, stage 3a: Secondary | ICD-10-CM | POA: Diagnosis not present

## 2022-06-23 ENCOUNTER — Other Ambulatory Visit: Payer: Self-pay | Admitting: Cardiovascular Disease

## 2022-06-23 NOTE — Telephone Encounter (Signed)
Rx(s) sent to pharmacy electronically.  

## 2022-06-25 DIAGNOSIS — Z1152 Encounter for screening for COVID-19: Secondary | ICD-10-CM | POA: Diagnosis not present

## 2022-06-26 DIAGNOSIS — Z1152 Encounter for screening for COVID-19: Secondary | ICD-10-CM | POA: Diagnosis not present

## 2022-07-05 ENCOUNTER — Ambulatory Visit: Payer: Medicare Other | Admitting: Cardiovascular Disease

## 2022-07-06 ENCOUNTER — Ambulatory Visit (INDEPENDENT_AMBULATORY_CARE_PROVIDER_SITE_OTHER): Payer: Medicare Other

## 2022-07-06 DIAGNOSIS — I428 Other cardiomyopathies: Secondary | ICD-10-CM | POA: Diagnosis not present

## 2022-07-06 LAB — CUP PACEART REMOTE DEVICE CHECK
Battery Remaining Longevity: 51 mo
Battery Remaining Percentage: 62 %
Battery Voltage: 2.95 V
Brady Statistic AP VP Percent: 93 %
Brady Statistic AP VS Percent: 3 %
Brady Statistic AS VP Percent: 1 %
Brady Statistic AS VS Percent: 1 %
Brady Statistic RA Percent Paced: 92 %
Date Time Interrogation Session: 20230815125009
HighPow Impedance: 75 Ohm
Implantable Lead Implant Date: 20190821
Implantable Lead Implant Date: 20201217
Implantable Lead Implant Date: 20201217
Implantable Lead Location: 753858
Implantable Lead Location: 753859
Implantable Lead Location: 753860
Implantable Pulse Generator Implant Date: 20201217
Lead Channel Impedance Value: 380 Ohm
Lead Channel Impedance Value: 490 Ohm
Lead Channel Impedance Value: 600 Ohm
Lead Channel Pacing Threshold Amplitude: 0.5 V
Lead Channel Pacing Threshold Amplitude: 0.75 V
Lead Channel Pacing Threshold Amplitude: 1.5 V
Lead Channel Pacing Threshold Pulse Width: 0.4 ms
Lead Channel Pacing Threshold Pulse Width: 0.5 ms
Lead Channel Pacing Threshold Pulse Width: 0.5 ms
Lead Channel Sensing Intrinsic Amplitude: 12 mV
Lead Channel Sensing Intrinsic Amplitude: 2.4 mV
Lead Channel Setting Pacing Amplitude: 2 V
Lead Channel Setting Pacing Amplitude: 2 V
Lead Channel Setting Pacing Amplitude: 2.5 V
Lead Channel Setting Pacing Pulse Width: 0.5 ms
Lead Channel Setting Pacing Pulse Width: 0.5 ms
Lead Channel Setting Sensing Sensitivity: 0.5 mV
Pulse Gen Serial Number: 111014567

## 2022-07-22 DIAGNOSIS — I5032 Chronic diastolic (congestive) heart failure: Secondary | ICD-10-CM | POA: Diagnosis not present

## 2022-07-22 DIAGNOSIS — I130 Hypertensive heart and chronic kidney disease with heart failure and stage 1 through stage 4 chronic kidney disease, or unspecified chronic kidney disease: Secondary | ICD-10-CM | POA: Diagnosis not present

## 2022-07-22 DIAGNOSIS — N1831 Chronic kidney disease, stage 3a: Secondary | ICD-10-CM | POA: Diagnosis not present

## 2022-07-22 NOTE — Progress Notes (Deleted)
Cardiology Office Note:    Date:  07/29/2022   ID:  [name], DOB 06-25-1941, MRN [id number]  PCP:  Lucianne Lei, [place] [facility] Cardiologist:  Shelva Majestic, MD Cardiology APP:  Ledora Bottcher, [place]  Electrophysiologist:  Sanda Klein, MD { Referring MD: Lucianne Lei, MD   Chief Complaint  Patient presents with   Follow-up    NICM   History of Present Illness:    [name] is a 81 y.o. male with a hx of paroxysmal VT s/p ablation 2013, nonischemic cardiomyopathy with LVEF as low as 20-25% in 2012, improved on echo 06/2020, CAD, NSTEMI 2012, low risk Myoview 03/2015, ICD for sinus bradycardia and second-degree AV block Mobitz type I 06/2018, and upgraded to CRT-D 10/2019.  Left heart catheterization 11/07/2019 showed proximal to mid LAD lesion of 30% and 80% stenosis of the D1.  Medical therapy was recommended.  Last echocardiogram 01/26/2022 showed LVEF 84-66%, grade 2 diastolic dysfunction, normal RV, severely dilated LA, mild MR, and moderate TR.  He was seen by Dr. Sallyanne Kuster 10/05/2021 to discuss increased atrial fibrillation burden detected on his ICD.  He was started on Eliquis with dose reduced to 2.5 mg for age and renal function.  He was seen by Dr. Claiborne Billings in clinic 01/12/2022 and only complained of some mild dyspnea on exertion.  He is maintained on 2.5 mg Eliquis twice daily, 20 mg Lasix daily, 100 mg Toprol, 12.5 mg spironolactone, and 30 mg Imdur.  He presents today for 32-monthfollow-up. He was seen in the ER yesterday with hematuria and diagnosed with cystitis and started on ABX. Mild hematuria. I advised he doesn't need to take ASA given no obstructive disease or prior PCI, but he insists on taking ASA. No cardiac complaints.    Past Medical History:  Diagnosis Date   AICD (automatic cardioverter/defibrillator) present    CAD (coronary artery disease) 80% stenosis diag of the LAD, 30% in OM2 branch of LCX in 2009    a. Nonobstructive CAD  by cath 11/2011 with the exception of the pre-existing diagonal branch #2 lesion.   Chronic systolic CHF (congestive heart failure) (HCC)    CKD (chronic kidney disease) stage 3, GFR 30-59 ml/min (HCC)    Colon polyp, hyperplastic    History of stress test 06/01/2012   Normal myocardial perfusion study. compared to the previous study there is no significant change. this is a low risk scan   Hypertension    Legally blind    "both eyes"   Myocardial infarction ([facility] 11/22/2011   NIC[place]ischemic cardiomyopathy) ([place]    a. Remote hx of dilated NICM with EF ranging 20-45%, including normal EF by echo (55-60%) in 2014.   Peripheral arterial disease ([facility]    a. 06/2014: ABI right 0.99, left 1.2, LE dopplers revealing an occluded right posterior tibial. As symptoms were not felt r/t claudication, no further w/u at the time.   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 Requiring discontinuation of BB dose.   Spondylolisthesis    Ventricular bigeminy    [place]ent[place] tachy[place] ([place]oxysmal) ([place] 04/11/2015    Past Surgical History:  Procedure Laterality Date   BACK SURGERY     BIV UPGRADE N/A 11/08/2019   Procedure: BIV UPGRADE;  Surgeon: TEvans Lance MD;  [facility];  Service: Cardiovascular;  Laterality: N/A;   CARDIAC CATHETERIZATION  11/2011  CARDIAC CATHETERIZATION  11/2011   didn't demonstrate high grade obstructive disease to account for his LV dysfunction.   CATARACT EXTRACTION, BILATERAL  1990's   CYSTOSCOPY     CYSTOSCOPY WITH BIOPSY Bilateral 08/03/2021   Procedure: CYSTOSCOPY WITH BLADDER BIOPSY, BILATERAL RETROGRADE PYELOGRAM;  Surgeon: Lucas Mallow, MD;  Location: [facility];  Service: Urology;  Laterality: Bilateral;  REQUESTING [place] N/A 05/04/2013   Procedure: CYSTOSCOPY WITH URETHRAL DILATATION;  Surgeon: Eustace Moore, MD;  Location: [facility];   Service: Neurosurgery;  Laterality: N/A;  with insertion of foley catheter   EP study and ablation of VT  7/13   PVC focus mapped to the right coronary cusp of the aorta, limited ablation performed due to proximity of the focus to the right coronary artery   EYE SURGERY     LEFT HEART CATH AND CORONARY ANGIOGRAPHY N/A 11/07/2019   Procedure: LEFT HEART CATH AND CORONARY ANGIOGRAPHY;  Surgeon: Wellington Hampshire, MD;  Location: [facility];  Service: Cardiovascular;  Laterality: N/A;   LEFT HEART CATHETERIZATION WITH CORONARY ANGIOGRAM N/A 11/25/2011   Procedure: LEFT HEART CATHETERIZATION WITH CORONARY ANGIOGRAM;  Surgeon: Leonie Man, MD;  Location: [facility] C[facility] LAB;  Service: Cardiovascular;  Laterality: N/A;   PACEMAKER IMPLANT N/A 07/12/2018   Procedure: PACEMAKER IMPLANT;  Surgeon: Sanda Klein, MD;  Location: [facility];  Service: Cardiovascular;  Laterality: N/A;   POSTERIOR FUSION LUMBAR SPINE  1979   ROTATOR CUFF REPAIR  2000's   left   V-TACH ABLATION N/A 06/06/2012   Procedure: V-TACH ABLATION;  Surgeon: Thompson Grayer, MD;  Location: [facility] CATH LAB;  Service: Cardiovascular;  Laterality: N/A;    Current Medications: Current Meds  Medication Sig   albuterol (VENTOLIN HFA) 108 (90 Base) MCG/ACT inhaler Inhale 1-2 puffs into the lungs every 6 (six) hours as needed for wheezing or shortness of breath.   apixaban (ELIQUIS) 2.5 MG TABS tablet Take 1 tablet (2.5 mg total) by mouth 2 (two) times daily.   aspirin EC 81 MG tablet Take 81 mg by mouth daily. Swallow whole.   BREO ELLIPTA 100-25 MCG/INH AEPB Inhale 1 puff into the lungs every morning.   dorzolamide-timolol (COSOPT) 22.3-6.8 MG/ML ophthalmic solution Place 1 drop into both eyes 2 (two) times daily.   isosorbide mononitrate (IMDUR) 30 MG 24 hr tablet Take 30 mg by mouth daily.   metoprolol succinate (TOPROL-XL) 100 MG 24 hr tablet TAKE 1 TABLET(100 MG) BY MOUTH DAILY WITH OR IMMEDIATELY FOLLOWING A MEAL    nitroGLYCERIN (NITROSTAT) 0.4 MG SL tablet Place 1 tablet (0.4 mg total) under the tongue every 5 (five) minutes as needed for chest pain.   triamcinolone (KENALOG) 0.025 % ointment Apply 1 Application topically 2 (two) times daily.     Allergies:   Patient has no known allergies.   Social History   Socioeconomic History   Marital status: Legally Separated    Spouse name: Not on file   Number of children: 3   Years of education: Not on file   Highest education level: Not on file  Occupational History   Occupation: Retired    Fish farm manager: RETIRED  Tobacco Use   Smoking status: Former    Years: 50.00    Types: Cigarettes    Quit date: 11/23/1999    Years since quitting: 22.6   Smokeless tobacco: Never  Vaping Use   Vaping Use: Never used  Substance and Sexual  Activity   Alcohol use: No    Comment: 05/26/12 "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; stopped drinking in the early 1980's"   Drug use: No   Sexual activity: Yes  Other Topics Concern   Not on file  Social History Narrative   Not on file   Social Determinants of Health   Financial Resource Strain: Not on file  Food Insecurity: No Food Insecurity (02/24/2021)   Hunger Vital Sign    Worried About Running Out of Food in the Last Year: Never true    Ran Out of Food in the Last Year: Never true  Transportation Needs: No Transportation Needs (02/24/2021)   PRAPARE - Hydrologist (Medical): No    Lack of Transportation (Non-Medical): No  Physical Activity: Not on file  Stress: Not on file  Social Connections: Not on file    Family History: The patient's family history includes Asthma in his mother; Other in an other family member.  ROS:   Please see the history of present illness.     All other systems reviewed and are negative.  EKGs/Labs/Other Studies Reviewed:    The following studies were reviewed today:  Echo 01/26/22:  1. Left ventricular ejection fraction, by estimation, is 35 to 40%. The  left  ventricle has moderately decreased function. The left ventricle  demonstrates regional wall motion abnormalities (see scoring  diagram/findings for description). The left  ventricular internal cavity size was moderately dilated. Left ventricular  diastolic parameters are consistent with Grade II diastolic dysfunction  (pseudonormalization). There is severe akinesis of the left ventricular,  basal-mid inferior wall and  inferolateral wall.   2. Right ventricular systolic function is normal. The right ventricular  size is normal. There is normal pulmonary artery systolic pressure.   3. Left atrial size was severely dilated.   4. The mitral valve is normal in structure. Mild mitral valve  regurgitation.   5. Tricuspid valve regurgitation is moderate.   6. The aortic valve is tricuspid. Aortic valve regurgitation is not  visualized. No aortic stenosis is present.   EKG:  EKG is not ordered today.   Recent Labs: 01/12/2022: ALT 12; TSH 1.540 07/28/2022: BUN 17; Creatinine, Ser 1.63; Hemoglobin 13.1; Platelets 138; Potassium 3.7; Sodium 143  Recent Lipid Panel    Component Value Date/Time   CHOL 118 01/12/2022 0916   TRIG 46 01/12/2022 0916   HDL 47 01/12/2022 0916   CHOLHDL 2.5 01/12/2022 0916   CHOLHDL 2.2 01/24/2017 0950   VLDL 8 01/24/2017 0950   LDLCALC 60 01/12/2022 0916     Risk Assessment/Calculations:    CHA2DS2-VASc Score = 5 :1} This indicates a 7.2% annual risk of stroke. The patient's score is based upon: CHF History: 1 HTN History: 1 Diabetes History: 0 Stroke History: 0 Vascular Disease History: 1 Age Score: 2 Gender Score: 0          Physical Exam:    VS:  BP 118/80   Pulse 68   Ht '5\' 8"'$  (1.727 m)   Wt 168 lb 12.8 oz (76.6 kg)   SpO2 99%   BMI 25.67 kg/m     Wt Readings from Last 3 Encounters:  07/29/22 168 lb 12.8 oz (76.6 kg)  07/28/22 167 lb (75.8 kg)  01/12/22 168 lb 3.2 oz (76.3 kg)     GEN:  Well nourished, well developed in no acute  distress HEENT: Normal NECK: No JVD; No carotid bruits LYMPHATICS: No lymphadenopathy CARDIAC: RRR, no  murmurs, rubs, gallops RESPIRATORY:  Clear to auscultation without rales, wheezing or rhonchi  ABDOMEN: Soft, non-tender, non-distended MUSCULOSKELETAL:  No edema; No deformity  SKIN: Warm and dry NEUROLOGIC:  Alert and oriented x 3 PSYCHIATRIC:  Normal affect   ASSESSMENT:    1. Coronary artery disease of native artery of native heart with stable angina pectoris (HCC)   2. Paroxysmal atrial fibrillation ([place])   3. SSS (sick sinus syndrome) ([place])   4. Ventricular tachycardia (paroxysmal) ([place])   5. Chronic anticoagulation   6. Chronic combined systolic and diastolic heart failure ([place])   7. Nonischemic cardiomyopathy ([place])   8. Stage 3a chronic kidney disease (HCC)    PLAN:    In order of problems listed above:  Nonobstructive CAD Reassuring heart catheterization in 2020 No chest pain No indication for ASA with eliquis, but he insists on taking ASA because when he stopped the last time he passed out.    pVT s/p ablation PAF SSS ICD in place Chronic anticoagulation No bleeding.  Continue beta-blocker.   Nonischemic cardiomyopathy Chronic combined systolic and diastolic heart failure CKD 3A Recent echo with an LVEF 35-40% and G2 DD GDMT includes beta-blocker, Imdur, spironolactone, Lasix   Follow up with Dr. Claiborne Billings in 6 months.     Medication Adjustments/Labs and Tests Ordered: Current medicines are reviewed at length with the patient today.  Concerns regarding medicines are outlined above.  No orders of the defined types were placed in this encounter.  No orders of the defined types were placed in this encounter.   Patient Instructions  Medication Instructions:  Your physician recommends that you continue on your current medications as directed. Please refer to the Current Medication list given to you today.  *If you need a refill on your cardiac  medications before your next appointment, please call your pharmacy*  We recommend you stop taking Aspirin.    Lab Work: NONE If you have labs (blood work) drawn today and your tests are completely normal, you will receive your results only by: [place] (if you have MyChart) OR A paper copy in the mail If you have any lab test that is abnormal or we need to change your treatment, we will call you to review the results.   Testing/Procedures: NONE   Follow-Up: At [facility], you and your health needs are our priority.  As part of our continuing mission to provide you with exceptional heart care, we have created designated Provider Care Teams.  These Care Teams include your primary Cardiologist (physician) and Advanced Practice Providers (APPs -  Physician Assistants and Nurse Practitioners) who all work together to provide you with the care you need, when you need it.  We recommend signing up for the patient portal called "MyChart".  Sign up information is provided on this After Visit Summary.  MyChart is used to connect with patients for Virtual Visits (Telemedicine).  Patients are able to view lab/test results, encounter notes, upcoming appointments, etc.  Non-urgent messages can be sent to your provider as well.   To learn more about what you can do with MyChart, go to [email or web address].    Your next appointment:   6 month(s)  The format for your next appointment:   In Person  Provider:   Shelva Majestic, MD      Signed, [place], [place]  07/29/2022 8:23 AM    [place]

## 2022-07-28 ENCOUNTER — Encounter (HOSPITAL_COMMUNITY): Payer: Self-pay | Admitting: *Deleted

## 2022-07-28 ENCOUNTER — Other Ambulatory Visit: Payer: Self-pay

## 2022-07-28 ENCOUNTER — Emergency Department (HOSPITAL_COMMUNITY): Payer: Medicare Other

## 2022-07-28 ENCOUNTER — Emergency Department (HOSPITAL_COMMUNITY)
Admission: EM | Admit: 2022-07-28 | Discharge: 2022-07-28 | Disposition: A | Discharge status: Home / Self Care | Payer: Medicare Other | Attending: Emergency Medicine | Admitting: Emergency Medicine

## 2022-07-28 DIAGNOSIS — R319 Hematuria, unspecified: Secondary | ICD-10-CM | POA: Insufficient documentation

## 2022-07-28 DIAGNOSIS — N3090 Cystitis, unspecified without hematuria: Secondary | ICD-10-CM

## 2022-07-28 DIAGNOSIS — Z7901 Long term (current) use of anticoagulants: Secondary | ICD-10-CM | POA: Diagnosis not present

## 2022-07-28 DIAGNOSIS — Z79899 Other long term (current) drug therapy: Secondary | ICD-10-CM | POA: Insufficient documentation

## 2022-07-28 DIAGNOSIS — N390 Urinary tract infection, site not specified: Secondary | ICD-10-CM | POA: Diagnosis not present

## 2022-07-28 DIAGNOSIS — Z9049 Acquired absence of other specified parts of digestive tract: Secondary | ICD-10-CM | POA: Diagnosis not present

## 2022-07-28 DIAGNOSIS — I700 Atherosclerosis of aorta: Secondary | ICD-10-CM | POA: Diagnosis not present

## 2022-07-28 DIAGNOSIS — N281 Cyst of kidney, acquired: Secondary | ICD-10-CM | POA: Diagnosis not present

## 2022-07-28 DIAGNOSIS — N200 Calculus of kidney: Secondary | ICD-10-CM | POA: Diagnosis not present

## 2022-07-28 LAB — URINALYSIS, ROUTINE W REFLEX MICROSCOPIC
Bilirubin Urine: NEGATIVE
Glucose, UA: NEGATIVE mg/dL
Ketones, ur: NEGATIVE mg/dL
Nitrite: NEGATIVE
Protein, ur: NEGATIVE mg/dL
RBC / HPF: >50 RBC/hpf — ABNORMAL HIGH (ref 0–5)
Specific Gravity, Urine: 1.016 (ref 1.005–1.030)
WBC, UA: >50 WBC/hpf — ABNORMAL HIGH (ref 0–5)
pH: 6 (ref 5.0–8.0)

## 2022-07-28 LAB — CBC WITH DIFFERENTIAL/PLATELET
Abs Immature Granulocytes: 0.02 10*3/uL (ref 0.00–0.07)
Basophils Absolute: 0 10*3/uL (ref 0.0–0.1)
Basophils Relative: 1 %
Eosinophils Absolute: 0.4 10*3/uL (ref 0.0–0.5)
Eosinophils Relative: 8 %
HCT: 40 % (ref 39.0–52.0)
Hemoglobin: 13.1 g/dL (ref 13.0–17.0)
Immature Granulocytes: 0 %
Lymphocytes Relative: 32 %
Lymphs Abs: 1.5 10*3/uL (ref 0.7–4.0)
MCH: 30.6 pg (ref 26.0–34.0)
MCHC: 32.8 g/dL (ref 30.0–36.0)
MCV: 93.5 fL (ref 80.0–100.0)
Monocytes Absolute: 0.5 10*3/uL (ref 0.1–1.0)
Monocytes Relative: 12 %
Neutro Abs: 2.1 10*3/uL (ref 1.7–7.7)
Neutrophils Relative %: 47 %
Platelets: 138 10*3/uL — ABNORMAL LOW (ref 150–400)
RBC: 4.28 MIL/uL (ref 4.22–5.81)
RDW: 12.7 % (ref 11.5–15.5)
WBC: 4.6 10*3/uL (ref 4.0–10.5)
nRBC: 0 % (ref 0.0–0.2)

## 2022-07-28 LAB — BASIC METABOLIC PANEL
Anion gap: 5 (ref 5–15)
BUN: 17 mg/dL (ref 8–23)
CO2: 25 mmol/L (ref 22–32)
Calcium: 9.2 mg/dL (ref 8.9–10.3)
Chloride: 113 mmol/L — ABNORMAL HIGH (ref 98–111)
Creatinine, Ser: 1.63 mg/dL — ABNORMAL HIGH (ref 0.61–1.24)
GFR, Estimated: 42 mL/min — ABNORMAL LOW (ref >60)
Glucose, Bld: 113 mg/dL — ABNORMAL HIGH (ref 70–99)
Potassium: 3.7 mmol/L (ref 3.5–5.1)
Sodium: 143 mmol/L (ref 135–145)

## 2022-07-28 MED ORDER — CEPHALEXIN 500 MG PO CAPS
1000.0000 mg | ORAL_CAPSULE | Freq: Once | ORAL | Status: AC
Start: 2022-07-28 — End: 2022-07-28
  Administered 2022-07-28: 1000 mg via ORAL
  Filled 2022-07-28: qty 2

## 2022-07-28 MED ORDER — CEPHALEXIN 500 MG PO CAPS: 500.0000 mg | ORAL_CAPSULE | Freq: Four times a day (QID) | ORAL | 0 refills | Status: DC

## 2022-07-28 NOTE — Discharge Instructions (Addendum)
Follow up with [facility] next week °

## 2022-07-28 NOTE — ED Triage Notes (Signed)
Pt started to have hematuria yesterday, worse this am. Pt has history of kidney stones.

## 2022-07-28 NOTE — ED Provider Notes (Signed)
  Physical Exam  BP (!) 171/106   Pulse 74   Temp (!) 97.5 F (36.4 C) (Oral)   Resp 14   Ht '5\' 8"'$  (1.727 m)   Wt 75.8 kg   SpO2 93%   BMI 25.39 kg/m   Physical Exam Vitals and nursing note reviewed.  Constitutional:      General: He is not in acute distress.    Appearance: He is well-developed.  HENT:     Head: Normocephalic and atraumatic.  Eyes:     Conjunctiva/sclera: Conjunctivae normal.  Cardiovascular:     Rate and Rhythm: Normal rate and regular rhythm.     Heart sounds: No murmur heard. Pulmonary:     Effort: Pulmonary effort is normal. No respiratory distress.  Musculoskeletal:        General: No swelling.     Cervical back: Neck supple.  Skin:    General: Skin is warm and dry.  Neurological:     Mental Status: He is alert.  Psychiatric:        Mood and Affect: Mood normal.     Procedures  Procedures  ED Course / MDM    Medical Decision Making Amount and/or Complexity of Data Reviewed Labs: ordered. Radiology: ordered.  Risk Prescription drug management.   Patient received in handoff.  Flank pain and hematuria.  Pending CT stone study.  Plan is for discharge on Keflex if CT stone study negative.  Stone study is reassuringly negative for obstructing stone and patient discharged with outpatient urology follow-up and prescription for Keflex       Teressa Lower, MD 07/28/22 1640

## 2022-07-28 NOTE — ED Provider Notes (Signed)
[facility] DEPT Provider Note   CSN: [id number] Arrival date & time: 07/28/22  1119     History  Chief Complaint  Patient presents with   Hematuria    [name] is a 81 y.o. male.  Patient complains of blood in the urine for 1 day.  Patient has a history of kidney stones.  He has not had any fever no pain  The history is provided by the patient and medical records. No language interpreter was used.  Hematuria This is a new problem. The current episode started 12 to 24 hours ago. The problem occurs constantly. The problem has not changed since onset.Pertinent negatives include no chest pain, no abdominal pain and no headaches. Nothing aggravates the symptoms. Nothing relieves the symptoms.       Home Medications Prior to Admission medications   Medication Sig Start Date End Date Taking? Authorizing Provider  albuterol (VENTOLIN HFA) 108 (90 Base) MCG/ACT inhaler Inhale 1-2 puffs into the lungs every 6 (six) hours as needed for wheezing or shortness of breath.    [provider]  apixaban (ELIQUIS) 2.5 MG TABS tablet Take 1 tablet (2.5 mg total) by mouth 2 (two) times daily. 10/08/21   Croitoru, Mihai, MD  BREO ELLIPTA 100-25 MCG/INH AEPB Inhale 1 puff into the lungs every morning. 05/07/20   [provider]  dorzolamide-timolol (COSOPT) 22.3-6.8 MG/ML ophthalmic solution Place 1 drop into both eyes 2 (two) times daily. 03/14/20   [provider]  doxycycline (VIBRAMYCIN) 100 MG capsule Take 1 capsule (100 mg total) by mouth 2 (two) times daily. 05/13/22   Fransico Meadow, PA-C  fluconazole (DIFLUCAN) 200 MG tablet Take 200 mg by mouth daily. 07/13/21   [provider]  furosemide (LASIX) 20 MG tablet TAKE 1 TABLET(20 MG) BY MOUTH DAILY 06/23/22   Troy Sine, MD  HYDROcodone-acetaminophen (NORCO/VICODIN) 5-325 MG tablet Take 1 tablet by mouth every 4 (four) hours as needed. 12/19/19   Rancour, Annie Main, MD   isosorbide mononitrate (IMDUR) 30 MG 24 hr tablet Take 30 mg by mouth daily. 02/24/20   [provider]  LUMIGAN 0.01 % SOLN Place 1 drop into both eyes at bedtime.  12/06/14   [provider]  metoprolol succinate (TOPROL-XL) 100 MG 24 hr tablet TAKE 1 TABLET(100 MG) BY MOUTH DAILY WITH OR IMMEDIATELY FOLLOWING A MEAL 11/02/21   Troy Sine, MD  nitroGLYCERIN (NITROSTAT) 0.4 MG SL tablet Place 1 tablet (0.4 mg total) under the tongue every 5 (five) minutes as needed for chest pain. 04/28/22   Troy Sine, MD  RESTASIS 0.05 % ophthalmic emulsion Place 1 drop into both eyes 2 (two) times daily. 12/30/14   [provider]  spironolactone (ALDACTONE) 25 MG tablet TAKE 1/2 TABLET(12.5 MG) BY MOUTH TWICE DAILY 05/24/22   Troy Sine, MD  triamcinolone (KENALOG) 0.025 % ointment Apply 1 Application topically 2 (two) times daily. 05/13/22   Fransico Meadow, PA-C      Allergies    Patient has no known allergies.    Review of Systems   Review of Systems  Constitutional:  Negative for appetite change and fatigue.  HENT:  Negative for congestion, ear discharge and sinus pressure.   Eyes:  Negative for discharge.  Respiratory:  Negative for cough.   Cardiovascular:  Negative for chest pain.  Gastrointestinal:  Negative for abdominal pain and diarrhea.  Genitourinary:  Positive for hematuria. Negative for frequency.  Musculoskeletal:  Negative for  back pain.  Skin:  Negative for rash.  Neurological:  Negative for seizures and headaches.  Psychiatric/Behavioral:  Negative for hallucinations.     Physical Exam Updated Vital Signs BP (!) 182/112 (BP Location: Right Arm)   Pulse 63   Temp (!) 97.5 F (36.4 C) (Oral)   Resp 18   Ht '5\' 8"'$  (1.727 m)   Wt 75.8 kg   SpO2 96%   BMI 25.39 kg/m  Physical Exam Vitals and nursing note reviewed.  Constitutional:      Appearance: He is well-developed.  HENT:     Head: Normocephalic.     Nose: Nose normal.  Eyes:      General: No scleral icterus.    Conjunctiva/sclera: Conjunctivae normal.  Neck:     Thyroid: No thyromegaly.  Cardiovascular:     Rate and Rhythm: Normal rate and regular rhythm.     Heart sounds: No murmur heard.    No friction rub. No gallop.  Pulmonary:     Breath sounds: No stridor. No wheezing or rales.  Chest:     Chest wall: No tenderness.  Abdominal:     General: There is no distension.     Tenderness: There is no abdominal tenderness. There is no rebound.  Musculoskeletal:        General: Normal range of motion.     Cervical back: Neck supple.  Lymphadenopathy:     Cervical: No cervical adenopathy.  Skin:    Findings: No erythema or rash.  Neurological:     Mental Status: He is alert and oriented to person, place, and time.     Motor: No abnormal muscle tone.     Coordination: Coordination normal.  Psychiatric:        Behavior: Behavior normal.     ED Results / Procedures / Treatments   Labs (all labs ordered are listed, but only abnormal results are displayed) Labs Reviewed  URINALYSIS, ROUTINE W REFLEX MICROSCOPIC - Abnormal; Notable for the following components:      Result Value   APPearance HAZY (*)    Hgb urine dipstick LARGE (*)    Leukocytes,Ua MODERATE (*)    RBC / HPF >50 (*)    WBC, UA >50 (*)    Bacteria, UA RARE (*)    All other components within normal limits  BASIC METABOLIC PANEL - Abnormal; Notable for the following components:   Chloride 113 (*)    Glucose, Bld 113 (*)    Creatinine, Ser 1.63 (*)    GFR, Estimated 42 (*)    All other components within normal limits  CBC WITH DIFFERENTIAL/PLATELET - Abnormal; Notable for the following components:   Platelets 138 (*)    All other components within normal limits  URINE CULTURE    EKG None  Radiology No results found.  Procedures Procedures    Medications Ordered in ED Medications - No data to display  ED Course/ Medical Decision Making/ A&P  Patient with hematuria.   Possible urinary tract infection.  We will get a renal CT scan to check for stones                         Medical Decision Making Amount and/or Complexity of Data Reviewed Labs: ordered. Radiology: ordered.  Risk Prescription drug management.  This patient presents to the ED for concern of hematuria, this involves an extensive number of treatment options, and is a complaint that carries with it a high  risk of complications and morbidity.  The differential diagnosis includes kidney stone, renal cancer, UTI   Co morbidities that complicate the patient evaluation  History of kidney stones   Additional history obtained:  Additional history obtained from patient External records from outside source obtained and reviewed including hospital records   Lab Tests:  I Ordered, and personally interpreted labs.  The pertinent results include: Urinalysis shows blood   Imaging Studies ordered:  I ordered imaging studies including CT kidneys I independently visualized and interpreted imaging which showed no ureteral stone I agree with the radiologist interpretation   Cardiac Monitoring: / EKG:  The patient was maintained on a cardiac monitor.  I personally viewed and interpreted the cardiac monitored which showed an underlying rhythm of: Normal sinus rhythm   Consultations Obtained:  No consultant  Problem List / ED Course / Critical interventions / Medication management  Hematuria I ordered medication including Keflex for UTI Reevaluation of the patient after these medicines showed that the patient stayed the same I have reviewed the patients home medicines and have made adjustments as needed   Social Determinants of Health:  None   Test / Admission - Considered:  None  Patient with hematuria   Patient was followed up by my colleague and the CT scan was negative so he was sent home on Keflex and follow-up with urology       Final Clinical Impression(s) / ED  Diagnoses Final diagnoses:  None    Rx / DC Orders ED Discharge Orders     None         Milton Ferguson, MD 07/30/22 1127

## 2022-07-28 NOTE — ED Provider Triage Note (Signed)
Emergency Medicine Provider Triage Evaluation Note  [name] , a 81 y.o. male  was evaluated in triage.  Pt complains of gross hematuria that he first noticed yesterday.  Yesterday when he noticed blood, it was only slight amounts, however this morning he said it was large amounts of frank blood.  He denies dysuria or abdominal pain, vomiting, diarrhea.  He states that he thinks something happened similarly in the past when he had a kidney stone.  Review of Systems  Positive: Even area Negative: Fevers, chills, dysuria, abdominal pain, nausea, vomiting, diarrhea  Physical Exam  BP (!) 173/96   Pulse 75   Temp 98.1 F (36.7 C) (Oral)   Resp 18   Ht '5\' 8"'$  (1.727 m)   Wt 75.8 kg   SpO2 96%   BMI 25.39 kg/m  Gen:   Awake, no distress   Resp:  Normal effort  MSK:   Moves extremities without difficulty  Other:  Abdomen is soft, nontender, negative CVA tenderness bilaterally  Medical Decision Making  Medically screening exam initiated at 12:15 PM.  Appropriate orders placed.  [name] was informed that the remainder of the evaluation will be completed by another provider, this initial triage assessment does not replace that evaluation, and the importance of remaining in the ED until their evaluation is complete.     [name], [place] 07/28/22 1216

## 2022-07-29 ENCOUNTER — Encounter: Payer: Self-pay | Admitting: Physician Assistant

## 2022-07-29 ENCOUNTER — Ambulatory Visit: Payer: Medicare Other | Attending: Cardiovascular Disease | Admitting: Physician Assistant

## 2022-07-29 VITALS — BP 118/80 | HR 68 | Ht 68.0 in | Wt 168.8 lb

## 2022-07-29 DIAGNOSIS — Z7901 Long term (current) use of anticoagulants: Secondary | ICD-10-CM

## 2022-07-29 DIAGNOSIS — I4729 Other ventricular tachycardia: Secondary | ICD-10-CM

## 2022-07-29 DIAGNOSIS — I25118 Atherosclerotic heart disease of native coronary artery with other forms of angina pectoris: Secondary | ICD-10-CM

## 2022-07-29 DIAGNOSIS — I480 Paroxysmal atrial fibrillation: Secondary | ICD-10-CM

## 2022-07-29 DIAGNOSIS — I5042 Chronic combined systolic (congestive) and diastolic (congestive) heart failure: Secondary | ICD-10-CM | POA: Diagnosis not present

## 2022-07-29 DIAGNOSIS — I495 Sick sinus syndrome: Secondary | ICD-10-CM | POA: Diagnosis not present

## 2022-07-29 DIAGNOSIS — I428 Other cardiomyopathies: Secondary | ICD-10-CM

## 2022-07-29 DIAGNOSIS — N1831 Chronic kidney disease, stage 3a: Secondary | ICD-10-CM

## 2022-07-29 NOTE — Patient Instructions (Signed)
Medication Instructions:  Your physician recommends that you continue on your current medications as directed. Please refer to the Current Medication list given to you today.  *If you need a refill on your cardiac medications before your next appointment, please call your pharmacy*  We recommend you stop taking Aspirin.    Lab Work: NONE If you have labs (blood work) drawn today and your tests are completely normal, you will receive your results only by: [place] (if you have MyChart) OR A paper copy in the mail If you have any lab test that is abnormal or we need to change your treatment, we will call you to review the results.   Testing/Procedures: NONE   Follow-Up: At [facility], you and your health needs are our priority.  As part of our continuing mission to provide you with exceptional heart care, we have created designated Provider Care Teams.  These Care Teams include your primary Cardiologist (physician) and Advanced Practice Providers (APPs -  Physician Assistants and Nurse Practitioners) who all work together to provide you with the care you need, when you need it.  We recommend signing up for the patient portal called "MyChart".  Sign up information is provided on this After Visit Summary.  MyChart is used to connect with patients for Virtual Visits (Telemedicine).  Patients are able to view lab/test results, encounter notes, upcoming appointments, etc.  Non-urgent messages can be sent to your provider as well.   To learn more about what you can do with MyChart, go to [email or web address].    Your next appointment:   6 month(s)  The format for your next appointment:   In Person  Provider:   Shelva Majestic, MD

## 2022-07-29 NOTE — Progress Notes (Addendum)
Cardiology Office Note:    Date:  07/29/2022   ID:  [name], DOB 28-Sep-1941, MRN [id number]  PCP:  Lucianne Lei, [place] Providers Cardiologist:  Shelva Majestic, MD Cardiology APP:  Ledora Bottcher, [place]  Electrophysiologist:  Sanda Klein, MD { Referring MD: Lucianne Lei, MD   Chief Complaint  Patient presents with   Follow-up    NICM    History of Present Illness:    [name] is a 81 y.o. male with a hx of paroxysmal VT s/p ablation 2013, nonischemic cardiomyopathy with LVEF as low as 20-25% in 2012, improved on echo 06/2020, CAD, NSTEMI 2012, low risk Myoview 03/2015, ICD for sinus bradycardia and second-degree AV block Mobitz type I 06/2018, and upgraded to CRT-D 10/2019.  Left heart catheterization 11/07/2019 showed proximal to mid LAD lesion of 30% and 80% stenosis of the D1.  Medical therapy was recommended.  Last echocardiogram 01/26/2022 showed LVEF 75-64%, grade 2 diastolic dysfunction, normal RV, severely dilated LA, mild MR, and moderate TR.  He was seen by Dr. Sallyanne Kuster 10/05/2021 to discuss increased atrial fibrillation burden detected on his ICD.  He was started on Eliquis with dose reduced to 2.5 mg for age and renal function.  He was seen by Dr. Claiborne Billings in clinic 01/12/2022 and only complained of some mild dyspnea on exertion.  He is maintained on 2.5 mg Eliquis twice daily, 20 mg Lasix daily, 100 mg Toprol, 12.5 mg spironolactone, and 30 mg Imdur.  He presents today for 60-monthfollow-up. He was seen in the ER yesterday with hematuria and diagnosed with cystitis and started on ABX. Mild hematuria. I advised he doesn't need to take ASA given no obstructive disease or prior PCI, but he insists on taking ASA. No cardiac complaints.   He stays active and rides an exercise bike for 20 min per day.   Past Medical History:  Diagnosis Date   AICD (automatic cardioverter/defibrillator) present    CAD (coronary artery disease) 80% stenosis diag of  the LAD, 30% in OM2 branch of LCX in 2009    a. Nonobstructive CAD by cath 11/2011 with the exception of the pre-existing diagonal branch #2 lesion.   Chronic systolic CHF (congestive heart failure) (HCC)    CKD (chronic kidney disease) stage 3, GFR 30-59 ml/min (HCC)    Colon polyp, hyperplastic    History of stress test 06/01/2012   Normal myocardial perfusion study. compared to the previous study there is no significant change. this is a low risk scan   Hypertension    Legally blind    "both eyes"   Myocardial infarction ([facility] 11/22/2011   NIC[place] (nonischemic cardiomyopathy) ([place]    a. Remote hx of dilated NICM with EF ranging 20-45%, including normal EF by echo (55-60%) in 2014.   Peripheral arterial disease ([facility]    a. 06/2014: ABI right 0.99, left 1.2, LE dopplers revealing an occluded right posterior tibial. As symptoms were not felt r/t claudication, no further w/u at the time.   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 Requiring discontinuation of BB dose.   Spondylolisthesis    Ventricular bigeminy    [facility] [facility] ([place]oxysmal) [place] 04/11/2015    Past Surgical History:  Procedure Laterality Date   BACK SURGERY     BIV UPGRADE N/A 11/08/2019   Procedure: BIV UPGRADE;  Surgeon: TEvans Lance MD;  [facility];  Service: Cardiovascular;  Laterality: N/A;   CARDIAC CATHETERIZATION  11/2011   CARDIAC CATHETERIZATION  11/2011   didn't demonstrate high grade obstructive disease to account for his LV dysfunction.   CATARACT EXTRACTION, BILATERAL  1990's   CYSTOSCOPY     CYSTOSCOPY WITH BIOPSY Bilateral 08/03/2021   Procedure: CYSTOSCOPY WITH BLADDER BIOPSY, BILATERAL RETROGRADE PYELOGRAM;  Surgeon: Lucas Mallow, MD;  Location: [facility] ORS;  Service: Urology;  Laterality: Bilateral;  REQUESTING [place] N/A 05/04/2013   Procedure: CYSTOSCOPY WITH  URETHRAL DILATATION;  Surgeon: Eustace Moore, MD;  Location: [place] NEURO ORS;  Service: Neurosurgery;  Laterality: N/A;  with insertion of foley catheter   EP study and ablation of VT  7/13   PVC focus mapped to the right coronary cusp of the aorta, limited ablation performed due to proximity of the focus to the right coronary artery   EYE SURGERY     LEFT HEART CATH AND CORONARY ANGIOGRAPHY N/A 11/07/2019   Procedure: LEFT HEART CATH AND CORONARY ANGIOGRAPHY;  Surgeon: Wellington Hampshire, MD;  Location: [place] CV LAB;  Service: Cardiovascular;  Laterality: N/A;   LEFT HEART CATHETERIZATION WITH CORONARY ANGIOGRAM N/A 11/25/2011   Procedure: LEFT HEART CATHETERIZATION WITH CORONARY ANGIOGRAM;  Surgeon: Leonie Man, MD;  Location: [facility] CATH LAB;  Service: Cardiovascular;  Laterality: N/A;   PACEMAKER IMPLANT N/A 07/12/2018   Procedure: PACEMAKER IMPLANT;  Surgeon: Sanda Klein, MD;  Location: [place] CV LAB;  Service: Cardiovascular;  Laterality: N/A;   POSTERIOR FUSION LUMBAR SPINE  1979   ROTATOR CUFF REPAIR  2000's   left   V-TACH ABLATION N/A 06/06/2012   Procedure: V-TACH ABLATION;  Surgeon: Thompson Grayer, MD;  Location: [facility] CATH LAB;  Service: Cardiovascular;  Laterality: N/A;    Current Medications: Current Meds  Medication Sig   albuterol (VENTOLIN HFA) 108 (90 Base) MCG/ACT inhaler Inhale 1-2 puffs into the lungs every 6 (six) hours as needed for wheezing or shortness of breath.   apixaban (ELIQUIS) 2.5 MG TABS tablet Take 1 tablet (2.5 mg total) by mouth 2 (two) times daily.   aspirin EC 81 MG tablet Take 81 mg by mouth daily. Swallow whole.   BREO ELLIPTA 100-25 MCG/INH AEPB Inhale 1 puff into the lungs every morning.   dorzolamide-timolol (COSOPT) 22.3-6.8 MG/ML ophthalmic solution Place 1 drop into both eyes 2 (two) times daily.   isosorbide mononitrate (IMDUR) 30 MG 24 hr tablet Take 30 mg by mouth daily.   metoprolol succinate (TOPROL-XL) 100 MG 24 hr tablet TAKE 1  TABLET(100 MG) BY MOUTH DAILY WITH OR IMMEDIATELY FOLLOWING A MEAL   nitroGLYCERIN (NITROSTAT) 0.4 MG SL tablet Place 1 tablet (0.4 mg total) under the tongue every 5 (five) minutes as needed for chest pain.   triamcinolone (KENALOG) 0.025 % ointment Apply 1 Application topically 2 (two) times daily.     Allergies:   Patient has no known allergies.   Social History   Socioeconomic History   Marital status: Legally Separated    Spouse name: Not on file   Number of children: 3   Years of education: Not on file   Highest education level: Not on file  Occupational History   Occupation: Retired    Fish farm manager: RETIRED  Tobacco Use   Smoking status: Former    Years: 50.00    Types: Cigarettes    Quit date: 11/23/1999    Years since quitting: 34.6  Smokeless tobacco: Never  Vaping Use   Vaping Use: Never used  Substance and Sexual Activity   Alcohol use: No    Comment: 05/26/12 "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; stopped drinking in the early 1980's"   Drug use: No   Sexual activity: Yes  Other Topics Concern   Not on file  Social History Narrative   Not on file   Social Determinants of Health   Financial Resource Strain: Not on file  Food Insecurity: No Food Insecurity (02/24/2021)   Hunger Vital Sign    Worried About Running Out of Food in the Last Year: Never true    Ran Out of Food in the Last Year: Never true  Transportation Needs: No Transportation Needs (02/24/2021)   PRAPARE - Hydrologist (Medical): No    Lack of Transportation (Non-Medical): No  Physical Activity: Not on file  Stress: Not on file  Social Connections: Not on file     Family History: The patient's family history includes Asthma in his mother; Other in an other family member.  ROS:   Please see the history of present illness.     All other systems reviewed and are negative.  EKGs/Labs/Other Studies Reviewed:    The following studies were reviewed today:  Echo 01/2022:  1. Left  ventricular ejection fraction, by estimation, is 35 to 40%. The  left ventricle has moderately decreased function. The left ventricle  demonstrates regional wall motion abnormalities (see scoring  diagram/findings for description). The left  ventricular internal cavity size was moderately dilated. Left ventricular  diastolic parameters are consistent with Grade II diastolic dysfunction  (pseudonormalization). There is severe akinesis of the left ventricular,  basal-mid inferior wall and  inferolateral wall.   2. Right ventricular systolic function is normal. The right ventricular  size is normal. There is normal pulmonary artery systolic pressure.   3. Left atrial size was severely dilated.   4. The mitral valve is normal in structure. Mild mitral valve  regurgitation.   5. Tricuspid valve regurgitation is moderate.   6. The aortic valve is tricuspid. Aortic valve regurgitation is not  visualized. No aortic stenosis is present.   EKG:  EKG is  ordered today.   Recent Labs: 01/12/2022: ALT 12; TSH 1.540 07/28/2022: BUN 17; Creatinine, Ser 1.63; Hemoglobin 13.1; Platelets 138; Potassium 3.7; Sodium 143  Recent Lipid Panel    Component Value Date/Time   CHOL 118 01/12/2022 0916   TRIG 46 01/12/2022 0916   HDL 47 01/12/2022 0916   CHOLHDL 2.5 01/12/2022 0916   CHOLHDL 2.2 01/24/2017 0950   VLDL 8 01/24/2017 0950   LDLCALC 60 01/12/2022 0916     Risk Assessment/Calculations:                Physical Exam:    VS:  BP 118/80   Pulse 68   Ht '5\' 8"'$  (1.727 m)   Wt 168 lb 12.8 oz (76.6 kg)   SpO2 99%   BMI 25.67 kg/m     Wt Readings from Last 3 Encounters:  07/29/22 168 lb 12.8 oz (76.6 kg)  07/28/22 167 lb (75.8 kg)  01/12/22 168 lb 3.2 oz (76.3 kg)     GEN:  Well nourished, well developed in no acute distress HEENT: Normal NECK: No JVD; No carotid bruits LYMPHATICS: No lymphadenopathy CARDIAC: RRR, no murmurs, rubs, gallops RESPIRATORY:  Clear to auscultation  without rales, wheezing or rhonchi  ABDOMEN: Soft, non-tender, non-distended MUSCULOSKELETAL:  No  edema; No deformity  SKIN: Warm and dry NEUROLOGIC:  Alert and oriented x 3 PSYCHIATRIC:  Normal affect   ASSESSMENT:    1. Coronary artery disease of native artery of native heart with stable angina pectoris (HCC)   2. Paroxysmal atrial fibrillation ([place])   3. SSS (sick sinus syndrome) ([place])   4. Ventricular tachycardia (paroxysmal) ([place])   5. Chronic anticoagulation   6. Chronic combined systolic and diastolic heart failure ([place])   7. Nonischemic cardiomyopathy ([place])   8. Stage 3a chronic kidney disease (HCC)    PLAN:    In order of problems listed above:   Nonobstructive CAD Reassuring heart catheterization in 2020 No chest pain No indication for ASA with eliquis, but he insists on taking ASA because when he stopped the last time he passed out.    pVT s/p ablation PAF SSS ICD in place Chronic anticoagulation No bleeding.  Continue beta-blocker.   Nonischemic cardiomyopathy Chronic combined systolic and diastolic heart failure CKD 3A Recent echo with an LVEF 35-40% and G2 DD GDMT includes beta-blocker, Imdur, spironolactone, Lasix   Follow up with Dr. Claiborne Billings in 6 months.       Medication Adjustments/Labs and Tests Ordered: Current medicines are reviewed at length with the patient today.  Concerns regarding medicines are outlined above.  No orders of the defined types were placed in this encounter.  No orders of the defined types were placed in this encounter.   Patient Instructions  Medication Instructions:  Your physician recommends that you continue on your current medications as directed. Please refer to the Current Medication list given to you today.  *If you need a refill on your cardiac medications before your next appointment, please call your pharmacy*  We recommend you stop taking Aspirin.    Lab Work: NONE If you have labs (blood work) drawn  today and your tests are completely normal, you will receive your results only by: [place] (if you have MyChart) OR A paper copy in the mail If you have any lab test that is abnormal or we need to change your treatment, we will call you to review the results.   Testing/Procedures: NONE   Follow-Up: At [facility], you and your health needs are our priority.  As part of our continuing mission to provide you with exceptional heart care, we have created designated Provider Care Teams.  These Care Teams include your primary Cardiologist (physician) and Advanced Practice Providers (APPs -  Physician Assistants and Nurse Practitioners) who all work together to provide you with the care you need, when you need it.  We recommend signing up for the patient portal called "MyChart".  Sign up information is provided on this After Visit Summary.  MyChart is used to connect with patients for Virtual Visits (Telemedicine).  Patients are able to view lab/test results, encounter notes, upcoming appointments, etc.  Non-urgent messages can be sent to your provider as well.   To learn more about what you can do with MyChart, go to [email or web address].    Your next appointment:   6 month(s)  The format for your next appointment:   In Person  Provider:   Shelva Majestic, MD      Signed, [place], [place]  07/29/2022 8:26 AM    [name]

## 2022-07-31 LAB — URINE CULTURE: Culture: 80000 — AB

## 2022-08-01 ENCOUNTER — Telehealth (HOSPITAL_BASED_OUTPATIENT_CLINIC_OR_DEPARTMENT_OTHER): Payer: Self-pay | Admitting: *Deleted

## 2022-08-01 NOTE — Telephone Encounter (Signed)
Post ED Visit - Positive Culture Follow-up  Culture report reviewed by antimicrobial stewardship pharmacist: [place] Team '[]'$  Elenor Quinones, Pharm.D. '[]'$  Heide Guile, Pharm.D., BCPS AQ-ID '[]'$  Parks Neptune, Pharm.D., BCPS '[]'$  Alycia Rossetti, Pharm.D., BCPS '[]'$  [place], Pharm.D., BCPS, AAHIVP '[]'$  Legrand Como, Pharm.D., BCPS, AAHIVP '[]'$  Salome Arnt, PharmD, BCPS '[]'$  Johnnette Gourd, PharmD, BCPS '[]'$  Hughes Better, PharmD, BCPS '[]'$  Leeroy Cha, PharmD '[]'$  Laqueta Linden, PharmD, BCPS '[]'$  Albertina Parr, PharmD  [place] Team '[]'$  Leodis Sias, PharmD '[]'$  Lindell Spar, PharmD '[]'$  Royetta Asal, PharmD '[]'$  Graylin Shiver, Rph '[]'$  Rema Fendt) Glennon Mac, PharmD '[]'$  Arlyn Dunning, PharmD '[]'$  Netta Cedars, PharmD '[x]'$  Dia Sitter, PharmD '[]'$  Leone Haven, PharmD '[]'$  Gretta Arab, PharmD '[]'$  Theodis Shove, PharmD '[]'$  Peggyann Juba, PharmD '[]'$  Reuel Boom, PharmD   Positive urine culture Treated with Cephalexin, organism sensitive to the same and no further patient follow-up is required at this time.  Rosie Fate 08/01/2022, 8:50 AM

## 2022-08-06 DIAGNOSIS — I110 Hypertensive heart disease with heart failure: Secondary | ICD-10-CM | POA: Diagnosis not present

## 2022-08-06 DIAGNOSIS — H42 Glaucoma in diseases classified elsewhere: Secondary | ICD-10-CM | POA: Diagnosis not present

## 2022-08-06 DIAGNOSIS — N3001 Acute cystitis with hematuria: Secondary | ICD-10-CM | POA: Diagnosis not present

## 2022-08-06 DIAGNOSIS — D414 Neoplasm of uncertain behavior of bladder: Secondary | ICD-10-CM | POA: Diagnosis not present

## 2022-08-06 DIAGNOSIS — I130 Hypertensive heart and chronic kidney disease with heart failure and stage 1 through stage 4 chronic kidney disease, or unspecified chronic kidney disease: Secondary | ICD-10-CM | POA: Diagnosis not present

## 2022-08-06 DIAGNOSIS — I428 Other cardiomyopathies: Secondary | ICD-10-CM | POA: Diagnosis not present

## 2022-08-06 DIAGNOSIS — I480 Paroxysmal atrial fibrillation: Secondary | ICD-10-CM | POA: Diagnosis not present

## 2022-08-06 DIAGNOSIS — I495 Sick sinus syndrome: Secondary | ICD-10-CM | POA: Diagnosis not present

## 2022-08-06 DIAGNOSIS — N189 Chronic kidney disease, unspecified: Secondary | ICD-10-CM | POA: Diagnosis not present

## 2022-08-06 DIAGNOSIS — I25118 Atherosclerotic heart disease of native coronary artery with other forms of angina pectoris: Secondary | ICD-10-CM | POA: Diagnosis not present

## 2022-08-06 NOTE — Progress Notes (Signed)
Remote ICD transmission.   

## 2022-08-13 DIAGNOSIS — N200 Calculus of kidney: Secondary | ICD-10-CM | POA: Diagnosis not present

## 2022-08-13 DIAGNOSIS — R3121 Asymptomatic microscopic hematuria: Secondary | ICD-10-CM | POA: Diagnosis not present

## 2022-08-13 DIAGNOSIS — Z1152 Encounter for screening for COVID-19: Secondary | ICD-10-CM | POA: Diagnosis not present

## 2022-08-14 DIAGNOSIS — Z1152 Encounter for screening for COVID-19: Secondary | ICD-10-CM | POA: Diagnosis not present

## 2022-08-19 DIAGNOSIS — Z1152 Encounter for screening for COVID-19: Secondary | ICD-10-CM | POA: Diagnosis not present

## 2022-08-21 DIAGNOSIS — I130 Hypertensive heart and chronic kidney disease with heart failure and stage 1 through stage 4 chronic kidney disease, or unspecified chronic kidney disease: Secondary | ICD-10-CM | POA: Diagnosis not present

## 2022-08-21 DIAGNOSIS — N1831 Chronic kidney disease, stage 3a: Secondary | ICD-10-CM | POA: Diagnosis not present

## 2022-08-21 DIAGNOSIS — I5032 Chronic diastolic (congestive) heart failure: Secondary | ICD-10-CM | POA: Diagnosis not present

## 2022-08-27 DIAGNOSIS — Z1152 Encounter for screening for COVID-19: Secondary | ICD-10-CM | POA: Diagnosis not present

## 2022-09-01 DIAGNOSIS — H401133 Primary open-angle glaucoma, bilateral, severe stage: Secondary | ICD-10-CM | POA: Diagnosis not present

## 2022-09-02 DIAGNOSIS — Z1152 Encounter for screening for COVID-19: Secondary | ICD-10-CM | POA: Diagnosis not present

## 2022-09-15 ENCOUNTER — Encounter: Payer: Self-pay | Admitting: Podiatry

## 2022-09-15 ENCOUNTER — Ambulatory Visit (INDEPENDENT_AMBULATORY_CARE_PROVIDER_SITE_OTHER): Payer: Medicare Other | Admitting: Podiatry

## 2022-09-15 DIAGNOSIS — B351 Tinea unguium: Secondary | ICD-10-CM | POA: Diagnosis not present

## 2022-09-15 DIAGNOSIS — N1831 Chronic kidney disease, stage 3a: Secondary | ICD-10-CM

## 2022-09-15 DIAGNOSIS — M79675 Pain in left toe(s): Secondary | ICD-10-CM | POA: Diagnosis not present

## 2022-09-15 DIAGNOSIS — I739 Peripheral vascular disease, unspecified: Secondary | ICD-10-CM

## 2022-09-15 DIAGNOSIS — M79674 Pain in right toe(s): Secondary | ICD-10-CM

## 2022-09-15 DIAGNOSIS — D689 Coagulation defect, unspecified: Secondary | ICD-10-CM | POA: Diagnosis not present

## 2022-09-15 NOTE — Progress Notes (Signed)
This patient returns to my office for at risk foot care.  This patient requires this care by a professional since this patient will be at risk due to having , PAD and  Chronic kidney disease.  .  This patient is unable to cut nails himself since the patient cannot reach his nails.These nails are painful walking and wearing shoes.  This patient presents for at risk foot care today.  General Appearance  Alert, conversant and in no acute stress.  Vascular  Dorsalis pedis and posterior tibial  pulses are palpable  bilaterally.  Capillary return is within normal limits  bilaterally. Temperature is within normal limits  bilaterally.  Neurologic  Senn-Weinstein monofilament wire test within normal limits  bilaterally. Muscle power within normal limits bilaterally.  Nails Thick disfigured discolored nails with subungual debris  from hallux to fifth toes bilaterally. No evidence of bacterial infection or drainage bilaterally.  Orthopedic  No limitations of motion  feet .  No crepitus or effusions noted.  No bony pathology or digital deformities noted.  HAV  B/L.  Skin  normotropic skin with no porokeratosis noted bilaterally.  No signs of infections or ulcers noted.     Onychomycosis  Pain in right toes  Pain in left toes  Consent was obtained for treatment procedures.   Mechanical debridement of nails 1-5  bilaterally performed with a nail nipper.  Filed with dremel without incident.    Return office visit   3 months                   Told patient to return for periodic foot care and evaluation due to potential at risk complications.   Gardiner Barefoot DPM

## 2022-09-22 DIAGNOSIS — Z1152 Encounter for screening for COVID-19: Secondary | ICD-10-CM | POA: Diagnosis not present

## 2022-09-24 DIAGNOSIS — Z1152 Encounter for screening for COVID-19: Secondary | ICD-10-CM | POA: Diagnosis not present

## 2022-10-03 DIAGNOSIS — Z03818 Encounter for observation for suspected exposure to other biological agents ruled out: Secondary | ICD-10-CM | POA: Diagnosis not present

## 2022-10-04 ENCOUNTER — Ambulatory Visit: Payer: Medicare Other | Attending: Cardiovascular Disease | Admitting: Cardiovascular Disease

## 2022-10-04 ENCOUNTER — Encounter: Payer: Self-pay | Admitting: Cardiovascular Disease

## 2022-10-04 VITALS — BP 152/86 | HR 70 | Ht 68.0 in | Wt 169.6 lb

## 2022-10-04 DIAGNOSIS — Z95810 Presence of automatic (implantable) cardiac defibrillator: Secondary | ICD-10-CM | POA: Diagnosis not present

## 2022-10-04 DIAGNOSIS — I700 Atherosclerosis of aorta: Secondary | ICD-10-CM | POA: Diagnosis not present

## 2022-10-04 DIAGNOSIS — E785 Hyperlipidemia, unspecified: Secondary | ICD-10-CM | POA: Diagnosis not present

## 2022-10-04 DIAGNOSIS — I25118 Atherosclerotic heart disease of native coronary artery with other forms of angina pectoris: Secondary | ICD-10-CM | POA: Diagnosis not present

## 2022-10-04 DIAGNOSIS — I495 Sick sinus syndrome: Secondary | ICD-10-CM

## 2022-10-04 DIAGNOSIS — N1832 Chronic kidney disease, stage 3b: Secondary | ICD-10-CM | POA: Diagnosis not present

## 2022-10-04 DIAGNOSIS — I441 Atrioventricular block, second degree: Secondary | ICD-10-CM | POA: Diagnosis not present

## 2022-10-04 DIAGNOSIS — D6869 Other thrombophilia: Secondary | ICD-10-CM

## 2022-10-04 DIAGNOSIS — I10 Essential (primary) hypertension: Secondary | ICD-10-CM

## 2022-10-04 DIAGNOSIS — R7303 Prediabetes: Secondary | ICD-10-CM

## 2022-10-04 DIAGNOSIS — I4729 Other ventricular tachycardia: Secondary | ICD-10-CM

## 2022-10-04 DIAGNOSIS — I5042 Chronic combined systolic (congestive) and diastolic (congestive) heart failure: Secondary | ICD-10-CM

## 2022-10-04 DIAGNOSIS — I480 Paroxysmal atrial fibrillation: Secondary | ICD-10-CM

## 2022-10-04 MED ORDER — SACUBITRIL-VALSARTAN 24-26 MG PO TABS: 1.0000 | ORAL_TABLET | Freq: Two times a day (BID) | ORAL | 11 refills | Status: DC

## 2022-10-04 NOTE — Progress Notes (Unsigned)
Cardiology Office Note:    Date:  10/05/2022   ID:  [name], DOB 06-04-1941, MRN [id number]  PCP:  Lucianne Lei, MD  Cardiologist: Claiborne Billings Referring MD: Lucianne Lei, MD   Chief Complaint  Patient presents with   Congestive Heart Failure        Atrial Fibrillation   Pacemaker Check     History of Present Illness:    COLYN MIRON is a 81 y.o. male with a hx of dilated cardiomyopathy (EF as low as 20-25% in 2012, improved, last echo August 2021 LVEF 40-45%.), history of ventricular tachycardia ablation ([place], July 2013), coronary artery disease with a non-STEMI 2012, nonobstructive CAD by cath (low risk nuclear stress test May 2016, normal perfusion, runs of nonsustained VT, EF 51%), status post implantation of a dual-chamber permanent pacemaker for sinus bradycardia and second-degree AV block Mobitz type I (August 2019, [place]), asymptomatic paroxysmal atrial fibrillation recorded by pacemaker, presenting with ventricular tachycardia and syncope in December 2020, underwent upgrade to CRT-D device ([place], new [place] ICD lead, Dr. Lovena Le).  Started anticoagulation approximately 1 year ago when his device showed increasing burden of paroxysmal atrial fibrillation with several episodes lasting for a few hours.  He has had occasional epistaxis, but otherwise no serious bleeding issues.  He is feeling quite well except that he feels that his dyspnea may be a little worse.  He describes NYHA functional class 2-3 symptoms.  He just got married yesterday and his new wife accompanies him to the appointment.  Initial blood pressure was a little elevated, but normal when rechecked.  The patient specifically denies any chest pain at rest or with exertion, dyspnea at rest, orthopnea, paroxysmal nocturnal dyspnea, syncope, palpitations, focal neurological deficits, intermittent claudication, lower extremity edema, unexplained weight gain, cough, hemoptysis or  wheezing.  Device interrogation shows reassuring findings.  He has 92% atrial pacing and 94% biventricular pacing.  The burden of atrial fibrillation is low at 1.1%.  All lead parameters are good including the LV pacing threshold.  The LV lead is programmed M3-RV coil.  Estimated generator longevity is 4 years.  Despite his depressed LV systolic function, he is on beta-blockers as the only component of guideline directed medical therapy for systolic heart failure.  He is not on ACE inhibitor/ARB or Entresto.  He is not on an SGLT2 inhibitor.  He is not on spironolactone.  His creatinine is 1.55, corresponding to a creatinine clearance of roughly 45, so all of these therapies may be useful, if tolerated.   Past Medical History:  Diagnosis Date   AICD (automatic cardioverter/defibrillator) present    CAD (coronary artery disease) 80% stenosis diag of the LAD, 30% in OM2 branch of LCX in 2009    a. Nonobstructive CAD by cath 11/2011 with the exception of the pre-existing diagonal branch #2 lesion.   Chronic systolic CHF (congestive heart failure) (HCC)    CKD (chronic kidney disease) stage 3, GFR 30-59 ml/min (HCC)    Colon polyp, hyperplastic    History of stress test 06/01/2012   Normal myocardial perfusion study. compared to the previous study there is no significant change. this is a low risk scan   Hypertension    Legally blind    "both eyes"   Myocardial infarction [facility]) 11/22/2011   NICM (nonischemic cardiomyopathy) ([place])    a. Remote hx of dilated NICM with EF ranging 20-45%, including normal EF by echo (55-60%) in 2014.   Peripheral arterial  disease ([place])    a. 06/2014: ABI right 0.99, left 1.2, LE dopplers revealing an occluded right posterior tibial. As symptoms were not felt r/t claudication, no further w/u at the time.   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 Requiring discontinuation of BB dose.    Spondylolisthesis    Ventricular bigeminy    Ventricular tachycardia (paroxysmal) ([place]) 04/11/2015    Past Surgical History:  Procedure Laterality Date   BACK SURGERY     BIV UPGRADE N/A 11/08/2019   Procedure: BIV UPGRADE;  Surgeon: Evans Lance, MD;  Location: [place] [facility];  Service: Cardiovascular;  Laterality: N/A;   CARDIAC CATHETERIZATION  11/2011   CARDIAC CATHETERIZATION  11/2011   didn't demonstrate high grade obstructive disease to account for his LV dysfunction.   CATARACT EXTRACTION, BILATERAL  1990's   CYSTOSCOPY     CYSTOSCOPY WITH BIOPSY Bilateral 08/03/2021   Procedure: CYSTOSCOPY WITH BLADDER BIOPSY, BILATERAL RETROGRADE PYELOGRAM;  Surgeon: Lucas Mallow, MD;  Location: [facility];  Service: Urology;  Laterality: Bilateral;  REQUESTING [place] N/A 05/04/2013   Procedure: CYSTOSCOPY WITH URETHRAL DILATATION;  Surgeon: Eustace Moore, MD;  Location: [facility] NEURO ORS;  Service: Neurosurgery;  Laterality: N/A;  with insertion of foley catheter   EP study and ablation of VT  7/13   PVC focus mapped to the right coronary cusp of the aorta, limited ablation performed due to proximity of the focus to the right coronary artery   EYE SURGERY     LEFT HEART CATH AND CORONARY ANGIOGRAPHY N/A 11/07/2019   Procedure: LEFT HEART CATH AND CORONARY ANGIOGRAPHY;  Surgeon: Wellington Hampshire, MD;  Location: [facility];  Service: Cardiovascular;  Laterality: N/A;   LEFT HEART CATHETERIZATION WITH CORONARY ANGIOGRAM N/A 11/25/2011   Procedure: LEFT HEART CATHETERIZATION WITH CORONARY ANGIOGRAM;  Surgeon: Leonie Man, MD;  Location: [facility] CATH LAB;  Service: Cardiovascular;  Laterality: N/A;   PACEMAKER IMPLANT N/A 07/12/2018   Procedure: PACEMAKER IMPLANT;  Surgeon: Sanda Klein, MD;  Location: [place] CV LAB;  Service: Cardiovascular;  Laterality: N/A;   POSTERIOR FUSION LUMBAR SPINE  1979   ROTATOR CUFF REPAIR  2000's   left   V-TACH  ABLATION N/A 06/06/2012   Procedure: V-TACH ABLATION;  Surgeon: Thompson Grayer, MD;  Location: [facility] LAB;  Service: Cardiovascular;  Laterality: N/A;    Current Medications: Current Meds  Medication Sig   apixaban (ELIQUIS) 2.5 MG TABS tablet Take 1 tablet (2.5 mg total) by mouth 2 (two) times daily.   aspirin EC 81 MG tablet Take 81 mg by mouth daily. Swallow whole.   BREO ELLIPTA 100-25 MCG/INH AEPB Inhale 1 puff into the lungs every morning.   dorzolamide-timolol (COSOPT) 22.3-6.8 MG/ML ophthalmic solution Place 1 drop into both eyes 2 (two) times daily.   isosorbide mononitrate (IMDUR) 30 MG 24 hr tablet Take 30 mg by mouth daily.   metoprolol succinate (TOPROL-XL) 100 MG 24 hr tablet TAKE 1 TABLET(100 MG) BY MOUTH DAILY WITH OR IMMEDIATELY FOLLOWING A MEAL   sacubitril-valsartan (ENTRESTO) 24-26 MG Take 1 tablet by mouth 2 (two) times daily.   spironolactone (ALDACTONE) 25 MG tablet Take 25 mg by mouth daily.     Allergies:   Patient has no known allergies.   Social History   Socioeconomic History   Marital status: Legally Separated  Spouse name: Not on file   Number of children: 3   Years of education: Not on file   Highest education level: Not on file  Occupational History   Occupation: Retired    Fish farm manager: RETIRED  Tobacco Use   Smoking status: Former    Years: 50.00    Types: Cigarettes    Quit date: 11/23/1999    Years since quitting: 22.8   Smokeless tobacco: Never  Vaping Use   Vaping Use: Never used  Substance and Sexual Activity   Alcohol use: No    Comment: 05/26/12 "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; stopped drinking in the early 1980's"   Drug use: No   Sexual activity: Yes  Other Topics Concern   Not on file  Social History Narrative   Not on file   Social Determinants of Health   Financial Resource Strain: Not on file  Food Insecurity: No Food Insecurity (02/24/2021)   Hunger Vital Sign    Worried About Running Out of Food in the Last Year: Never true    [place] in the Last Year: Never true  Transportation Needs: No Transportation Needs (02/24/2021)   PRAPARE - Hydrologist (Medical): No    Lack of Transportation (Non-Medical): No  Physical Activity: Not on file  Stress: Not on file  Social Connections: Not on file     Family History: The patient's family history includes Asthma in his mother; Other in an other family member.  ROS:   Please see the history of present illness.    All other systems are reviewed and are negative.   EKGs/Labs/Other Studies Reviewed:    The following studies were reviewed today: Comprehensive pacemaker check in the office today  EKG:  EKG is ordered today.  It shows AV sequential pacing with rare PVCs and a distinct positive R wave in lead V1  Recent Labs: 01/12/2022: ALT 12; TSH 1.540 07/28/2022: BUN 17; Creatinine, Ser 1.63; Hemoglobin 13.1; Platelets 138; Potassium 3.7; Sodium 143    Recent Lipid Panel    Component Value Date/Time   CHOL 118 01/12/2022 0916   TRIG 46 01/12/2022 0916   HDL 47 01/12/2022 0916   CHOLHDL 2.5 01/12/2022 0916   CHOLHDL 2.2 01/24/2017 0950   VLDL 8 01/24/2017 0950   LDLCALC 60 01/12/2022 0916    Physical Exam:    VS:  BP (!) 152/86 (BP Location: Left Arm, Patient Position: Sitting, Cuff Size: Normal)   Pulse 70   Ht '5\' 8"'$  (1.727 m)   Wt 76.9 kg   SpO2 98%   BMI 25.79 kg/m     Wt Readings from Last 3 Encounters:  10/04/22 76.9 kg  07/29/22 76.6 kg  07/28/22 75.8 kg      General: Alert, oriented x3, no distress, healthy left subclavian defibrillator site Head: no evidence of trauma, PERRL, EOMI, no exophtalmos or lid lag, no myxedema, no xanthelasma; normal ears, nose and oropharynx Neck: normal jugular venous pulsations and no hepatojugular reflux; brisk carotid pulses without delay and no carotid bruits Chest: clear to auscultation, no signs of consolidation by percussion or palpation, normal fremitus, symmetrical and full  respiratory excursions Cardiovascular: normal position and quality of the apical impulse, regular rhythm, normal first and second heart sounds, no murmurs, rubs or gallops Abdomen: no tenderness or distention, no masses by palpation, no abnormal pulsatility or arterial bruits, normal bowel sounds, no hepatosplenomegaly Extremities: no clubbing, cyanosis or edema; 2+ radial, ulnar and  brachial pulses bilaterally; 2+ right femoral, posterior tibial and dorsalis pedis pulses; 2+ left femoral, posterior tibial and dorsalis pedis pulses; no subclavian or femoral bruits Neurological: grossly nonfocal Psych: Normal mood and affect     ASSESSMENT:    1. Chronic combined systolic and diastolic heart failure (HCC)   2. Paroxysmal atrial fibrillation ([place])   3. Acquired thrombophilia ([employer])   4. Biventricular automatic implantable cardioverter defibrillator in situ   5. Second degree AV block, Mobitz type I   6. SSS (sick sinus syndrome) ([place])   7. Hyperlipidemia with target LDL less than 70   8. Coronary artery disease of native artery of native heart with stable angina pectoris ([place])   9. Ventricular tachycardia (paroxysmal) ([place])   10. Essential hypertension   11. Stage 3b chronic kidney disease ([place])   12. Atherosclerosis of aorta ([place])   13. Prediabetes     PLAN:    In order of problems listed above:  HFrEF: He appears to be clinically euvolemic, but his exercise tolerance has diminished and he now has NYHA functional class II - III. Thoracic impedance/[place] also suggest that he remains euvolemic.  Need to improve medical therapy for heart failure.  Continue metoprolol.  Start Entresto 24/26 and recheck metabolic parameters in roughly 2 weeks.  His blood pressure will likely tolerate this well.  Would also recommend starting an SGLT2 inhibitor at his next appointment.  These 2 agents are the most likely to have significant impact on his functional status.  Gradually titrate up the dose of  Entresto and if renal function and potassium levels allow can also add spironolactone. Paroxysmal atrial fibrillation: Burden of arrhythmia is only around 1% and ventricular rate control is fair.  Appropriately anticoagulated. CHADSVasc 5 (age 41, HTN, CHF, CAD).   Anticoagulation: He has had problems with epistaxis in the past, but has not had any recently.  Eliquis dose adjusted for renal dysfunction. 2nd deg AVB: He is not pacemaker dependent.  CRT-D:  Only has about 94% biventricular pacing due to frequent PVCs.  The A-fib is not really interfering much with CRT.  Otherwise normal device function.  Continue remote downloads every 3 months. SSS: Over 90% atrial paced rhythm with good heart rate histogram distribution.  I do not think his fatigue or dyspnea can be blamed on chronotropic incompetence. CAD: Denies angina pectoris on 2 antianginal agents (metoprolol, long-acting nitrates). NSVT: Syncope related to VT last December 2021.  Had a VT ablation in 2013.  Currently has very rare sounds of nonsustained VT. HTN: BPs [place] little higher than desirable and there is room for [place] to improve medical therapy for heart failure. CKD 3: Creatinine has been stable in the 1.5-1.6 range.  GFR 40-45. Ao atherosclerosis: Dentally noted on CT, normal caliber aorta. preDM: Most recent hemoglobin A1c 5.9% and most recent LDL 55.  Borderline hyperglycemia another reason he might benefit from SGLT2 inhibitor.   Medication Adjustments/Labs and Tests Ordered: Current medicines are reviewed at length with the patient today.  Concerns regarding medicines are outlined above.  Orders Placed This Encounter  Procedures   EKG 12-Lead    Meds ordered this encounter  Medications   sacubitril-valsartan (ENTRESTO) 24-26 MG    Sig: Take 1 tablet by mouth 2 (two) times daily.    Dispense:  60 tablet    Refill:  11     Patient Instructions  Medication Instructions:  Your physician has recommended you make the  following change in your medication:   **  Begin Entresto 24/'26mg'$  - 1 tablet by mouth twice daily  *If you need a refill on your cardiac medications before your next appointment, please call your pharmacy*   Lab Work: None ordered.  If you have labs (blood work) drawn today and your tests are completely normal, you will receive your results only by: [place] (if you have MyChart) OR A paper copy in the mail If you have any lab test that is abnormal or we need to change your treatment, we will call you to review the results.   Testing/Procedures: None ordered.    Follow-Up: At [facility], you and your health needs are our priority.  As part of our continuing mission to provide you with exceptional heart care, we have created designated Provider Care Teams.  These Care Teams include your primary Cardiologist (physician) and Advanced Practice Providers (APPs -  Physician Assistants and Nurse Practitioners) who all work together to provide you with the care you need, when you need it.  We recommend signing up for the patient portal called "MyChart".  Sign up information is provided on this After Visit Summary.  MyChart is used to connect with patients for Virtual Visits (Telemedicine).  Patients are able to view lab/test results, encounter notes, upcoming appointments, etc.  Non-urgent messages can be sent to your provider as well.   To learn more about what you can do with MyChart, go to [email or web address].    Your next appointment:   2-4 weeks with clinical pharmacy   3 months with Gurtaj Ruz, Dani Gobble, MD   Important Information About Sugar         Signed, Sanda Klein, MD  10/05/2022 3:21 PM    [place]

## 2022-10-04 NOTE — Patient Instructions (Addendum)
Medication Instructions:  Your physician has recommended you make the following change in your medication:   ** Begin Entresto 24/'26mg'$  - 1 tablet by mouth twice daily  *If you need a refill on your cardiac medications before your next appointment, please call your pharmacy*   Lab Work: None ordered.  If you have labs (blood work) drawn today and your tests are completely normal, you will receive your results only by: [place] (if you have MyChart) OR A paper copy in the mail If you have any lab test that is abnormal or we need to change your treatment, we will call you to review the results.   Testing/Procedures: None ordered.    Follow-Up: At [facility], you and your health needs are our priority.  As part of our continuing mission to provide you with exceptional heart care, we have created designated Provider Care Teams.  These Care Teams include your primary Cardiologist (physician) and Advanced Practice Providers (APPs -  Physician Assistants and Nurse Practitioners) who all work together to provide you with the care you need, when you need it.  We recommend signing up for the patient portal called "MyChart".  Sign up information is provided on this After Visit Summary.  MyChart is used to connect with patients for Virtual Visits (Telemedicine).  Patients are able to view lab/test results, encounter notes, upcoming appointments, etc.  Non-urgent messages can be sent to your provider as well.   To learn more about what you can do with MyChart, go to [email or web address].    Your next appointment:   2-4 weeks with clinical pharmacy   3 months with [place], Dani Gobble, MD   Important Information About Sugar

## 2022-10-05 ENCOUNTER — Encounter: Payer: Self-pay | Admitting: Cardiovascular Disease

## 2022-10-07 DIAGNOSIS — Z1152 Encounter for screening for COVID-19: Secondary | ICD-10-CM | POA: Diagnosis not present

## 2022-10-10 DIAGNOSIS — Z1152 Encounter for screening for COVID-19: Secondary | ICD-10-CM | POA: Diagnosis not present

## 2022-10-18 ENCOUNTER — Other Ambulatory Visit: Payer: Self-pay | Admitting: Cardiovascular Disease

## 2022-10-19 DIAGNOSIS — I110 Hypertensive heart disease with heart failure: Secondary | ICD-10-CM | POA: Diagnosis not present

## 2022-10-19 DIAGNOSIS — I5042 Chronic combined systolic (congestive) and diastolic (congestive) heart failure: Secondary | ICD-10-CM | POA: Diagnosis not present

## 2022-10-19 DIAGNOSIS — I450 Right fascicular block: Secondary | ICD-10-CM | POA: Diagnosis not present

## 2022-10-19 DIAGNOSIS — R2231 Localized swelling, mass and lump, right upper limb: Secondary | ICD-10-CM | POA: Diagnosis not present

## 2022-10-22 DIAGNOSIS — B078 Other viral warts: Secondary | ICD-10-CM | POA: Diagnosis not present

## 2022-10-22 DIAGNOSIS — L280 Lichen simplex chronicus: Secondary | ICD-10-CM | POA: Diagnosis not present

## 2022-11-01 ENCOUNTER — Other Ambulatory Visit: Payer: Self-pay | Admitting: Cardiovascular Disease

## 2022-11-01 NOTE — Telephone Encounter (Signed)
Prescription refill request for Eliquis received. Indication:afib Last office visit:11/23 Scr:1.6 Age: 81 Weight:76.9  kg  Prescription refilled

## 2022-11-11 ENCOUNTER — Ambulatory Visit: Payer: Medicare Other | Attending: Internal Medicine | Admitting: Pharmacist

## 2022-11-11 ENCOUNTER — Encounter: Payer: Self-pay | Admitting: Pharmacist

## 2022-11-11 VITALS — BP 123/78 | HR 72 | Wt 170.6 lb

## 2022-11-11 DIAGNOSIS — I5032 Chronic diastolic (congestive) heart failure: Secondary | ICD-10-CM

## 2022-11-11 DIAGNOSIS — I25118 Atherosclerotic heart disease of native coronary artery with other forms of angina pectoris: Secondary | ICD-10-CM | POA: Diagnosis not present

## 2022-11-11 LAB — BASIC METABOLIC PANEL
BUN/Creatinine Ratio: 11 (ref 10–24)
BUN: 18 mg/dL (ref 8–27)
CO2: 24 mmol/L (ref 20–29)
Calcium: 9.6 mg/dL (ref 8.6–10.2)
Chloride: 105 mmol/L (ref 96–106)
Creatinine, Ser: 1.68 mg/dL — ABNORMAL HIGH (ref 0.76–1.27)
Glucose: 95 mg/dL (ref 70–99)
Potassium: 3.9 mmol/L (ref 3.5–5.2)
Sodium: 142 mmol/L (ref 134–144)
eGFR: 40 mL/min/{1.73_m2} — ABNORMAL LOW (ref >59)

## 2022-11-11 MED ORDER — NITROGLYCERIN 0.4 MG SL SUBL: 0.4000 mg | SUBLINGUAL_TABLET | SUBLINGUAL | 1 refills | Status: DC | PRN

## 2022-11-11 NOTE — Progress Notes (Signed)
Patient ID: [name]                 DOB: May 08, 1941                      MRN: [id number]     HPI: [name] is a 81 y.o. male referred by Dr. Sallyanne Kuster to pharmacy clinic for HF medication management. PMH is significant for CAD, CHF, angina, PAD, COPD, and CKD. Most recent LVEF 34-40% on 01/26/22  Patient started on Entresto 24-'26mg'$  at last visit with Dr Sallyanne Kuster.  Patient presents today for HF management. Brought all medications with him. Reports he feels well. Can "do all the things he wants to do" but does say he gets SOB while walking. Can not give estimate of how long it takes him to have SOB but he recovers quickly.   Has furosemide '20mg'$  with his medication bottles which was not on his med list. Reports he takes every other day.  Has no LEE.  Has Eliquis '5mg'$  and 2.'5mg'$  on medication list. Last Scr 1.63 on 07/28/22. Will update lab work today. May need to switch back to Eliquis 2.'5mg'$ .   Symptomatically he is feeling well. Denies dizziness, lightheadedness or chest pain. Has nitroglycerin at home however expired in 2021.   Weighs himself at home and says he is usually around 169 pounds which is similar to weight today in office. Has not been checking BP at home because he says his BP monitor is broken.  Current CHF meds:  Entresto 24-'26mg'$  BID Spironolactone '25mg'$  daily Isosorbide '30mg'$  daily Topril '100mg'$  daily Furosemide 20 mg every other day   BP goal: <130/80  Wt Readings from Last 3 Encounters:  10/04/22 169 lb 9.6 oz (76.9 kg)  07/29/22 168 lb 12.8 oz (76.6 kg)  07/28/22 167 lb (75.8 kg)   BP Readings from Last 3 Encounters:  10/04/22 (!) 152/86  07/29/22 118/80  07/28/22 (!) 175/97   Pulse Readings from Last 3 Encounters:  10/04/22 70  07/29/22 68  07/28/22 71    Renal function: CrCl cannot be calculated (Patient's most recent lab result is older than the maximum 21 days allowed.).  Past Medical History:  Diagnosis Date   AICD (automatic  cardioverter/defibrillator) present    CAD (coronary artery disease) 80% stenosis diag of the LAD, 30% in OM2 branch of LCX in 2009    a. Nonobstructive CAD by cath 11/2011 with the exception of the pre-existing diagonal branch #2 lesion.   Chronic systolic CHF (congestive heart failure) (HCC)    CKD (chronic kidney disease) stage 3, GFR 30-59 ml/min (HCC)    Colon polyp, hyperplastic    History of stress test 06/01/2012   Normal myocardial perfusion study. compared to the previous study there is no significant change. this is a low risk scan   Hypertension    Legally blind    "both eyes"   Myocardial infarction [facility]) 11/22/2011   NICM (nonischemic cardiomyopathy) ([place])    a. Remote hx of dilated NICM with EF ranging 20-45%, including normal EF by echo (55-60%) in 2014.   Peripheral arterial disease ([place])    a. 06/2014: ABI right 0.99, left 1.2, LE dopplers revealing an occluded right posterior tibial. As symptoms were not felt r/t claudication, no further w/u at the time.   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 Requiring discontinuation of BB dose.  Spondylolisthesis    Ventricular bigeminy    Ventricular tachycardia (paroxysmal) ([place]) 04/11/2015    Current Outpatient Medications on File Prior to Visit  Medication Sig Dispense Refill   albuterol (VENTOLIN HFA) 108 (90 Base) MCG/ACT inhaler Inhale 1-2 puffs into the lungs every 6 (six) hours as needed for wheezing or shortness of breath. (Patient not taking: Reported on 10/04/2022)     apixaban (ELIQUIS) 2.5 MG TABS tablet Take 1 tablet (2.5 mg total) by mouth 2 (two) times daily. 180 tablet 3   apixaban (ELIQUIS) 5 MG TABS tablet TAKE 1 TABLET(5 MG) BY MOUTH TWICE DAILY 60 tablet 5   aspirin EC 81 MG tablet Take 81 mg by mouth daily. Swallow whole.     BREO ELLIPTA 100-25 MCG/INH AEPB Inhale 1 puff into the lungs every morning.     dorzolamide-timolol (COSOPT)  22.3-6.8 MG/ML ophthalmic solution Place 1 drop into both eyes 2 (two) times daily.     isosorbide mononitrate (IMDUR) 30 MG 24 hr tablet Take 30 mg by mouth daily.     metoprolol succinate (TOPROL-XL) 100 MG 24 hr tablet TAKE 1 TABLET(100 MG) BY MOUTH DAILY WITH OR IMMEDIATELY FOLLOWING A MEAL 90 tablet 3   nitroGLYCERIN (NITROSTAT) 0.4 MG SL tablet Place 1 tablet (0.4 mg total) under the tongue every 5 (five) minutes as needed for chest pain. (Patient not taking: Reported on 10/04/2022) 25 tablet 1   sacubitril-valsartan (ENTRESTO) 24-26 MG Take 1 tablet by mouth 2 (two) times daily. 60 tablet 11   spironolactone (ALDACTONE) 25 MG tablet Take 25 mg by mouth daily.     No current facility-administered medications on file prior to visit.    No Known Allergies   Assessment/Plan:  1. CHF -    Patient BP in room today 123/78 which is at goal of <130/80.  Needs updated BMP today since starting Entresto. Will also verify which Eliquis dose he needs to be on. May be able to d/c furosemide as well.  Advised next treatment steps would be to increase Entresto or consider adding on Jardiance/Farxiga.  Recommended he contact insurance plan for new BP cuff.  Will refill nitroglycerin today since it is over 2 years expired.  Will schedule follow up when lab results come back  Continue: Entresto 24-'26mg'$  BID Spironolactone '25mg'$  daily Isosorbide '30mg'$  daily Toprol '100mg'$  daily Check BMP Likely follow up in clinic in 1 month  Karren Cobble, PharmD, [place], [place], [place], [place], [place], [address] Phone: [phone], Fax: [phone]

## 2022-11-11 NOTE — Patient Instructions (Addendum)
It was nice meeting you today  We would like your blood pressure to be less than 130/80  Please continue your: Entresto 24/'26mg'$  twice a day Spironolactone 1/2 tablet once a day Metoprolol '100mg'$  once a day Isosorbide '30mg'$  daily  We will check your lab work today to see if we need to adjust your dose of Eliquis and decide if we would like to increase your Entresto or add on a medication called Jardiance  Have a Merry Christmas!  Karren Cobble, PharmD, [place], [place], [place], [place], [place], [address] Phone: [phone], Fax: [phone]

## 2022-11-23 ENCOUNTER — Telehealth: Payer: Self-pay | Admitting: Cardiovascular Disease

## 2022-11-23 NOTE — Telephone Encounter (Signed)
Patient stated he believes entresto is causing an itchy rash on his neck, between legs and sometimes on his back. He denies SOB, chest pain, or edema. Please advise.

## 2022-11-23 NOTE — Telephone Encounter (Signed)
Pt c/o medication issue:  1. Name of Medication:   sacubitril-valsartan (ENTRESTO) 24-26 MG   2. How are you currently taking this medication (dosage and times per day)?   As prescribed  3. Are you having a reaction (difficulty breathing--STAT)?  Rash  4. What is your medication issue?   Patient stated this medication has given him a rash between his legs and on his neck.  Patient would like advice on next steps.

## 2022-11-23 NOTE — Telephone Encounter (Signed)
That is possibly an Entresto side effect. Please stop the [facility] and let us know if the rash subsides.

## 2022-11-23 NOTE — Telephone Encounter (Signed)
Spoke with patient and informed, him per Dr. Sallyanne Kuster, to stop taking Entresto and contact clinic if the rash subsides. Pt verbalized understanding.

## 2022-11-24 ENCOUNTER — Other Ambulatory Visit: Payer: Self-pay

## 2022-11-24 MED ORDER — APIXABAN 2.5 MG PO TABS: 2.5000 mg | ORAL_TABLET | Freq: Two times a day (BID) | ORAL | 3 refills | Status: DC

## 2022-12-22 ENCOUNTER — Encounter: Payer: Self-pay | Admitting: Podiatry

## 2022-12-22 ENCOUNTER — Ambulatory Visit (INDEPENDENT_AMBULATORY_CARE_PROVIDER_SITE_OTHER): Payer: 59 | Admitting: Podiatry

## 2022-12-22 ENCOUNTER — Ambulatory Visit: Payer: 59 | Admitting: Podiatry

## 2022-12-22 DIAGNOSIS — M79675 Pain in left toe(s): Secondary | ICD-10-CM

## 2022-12-22 DIAGNOSIS — B351 Tinea unguium: Secondary | ICD-10-CM

## 2022-12-22 DIAGNOSIS — M79674 Pain in right toe(s): Secondary | ICD-10-CM

## 2022-12-22 NOTE — Progress Notes (Signed)
Subjective:   Patient ID: [name], male   DOB: 82 y.o.   MRN: [id number]   HPI Patient presents stating he has elongated nails he is not able to take care of it he gets up about every 3 months.  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

## 2023-01-03 DIAGNOSIS — N1831 Chronic kidney disease, stage 3a: Secondary | ICD-10-CM | POA: Diagnosis not present

## 2023-01-07 DIAGNOSIS — T7840XA Allergy, unspecified, initial encounter: Secondary | ICD-10-CM | POA: Diagnosis not present

## 2023-01-07 DIAGNOSIS — E1169 Type 2 diabetes mellitus with other specified complication: Secondary | ICD-10-CM | POA: Diagnosis not present

## 2023-01-07 DIAGNOSIS — I130 Hypertensive heart and chronic kidney disease with heart failure and stage 1 through stage 4 chronic kidney disease, or unspecified chronic kidney disease: Secondary | ICD-10-CM | POA: Diagnosis not present

## 2023-01-17 ENCOUNTER — Encounter: Payer: Self-pay | Admitting: Cardiovascular Disease

## 2023-01-17 ENCOUNTER — Ambulatory Visit: Payer: 59 | Attending: Cardiovascular Disease | Admitting: Cardiovascular Disease

## 2023-01-17 VITALS — BP 124/70 | HR 58 | Ht 68.0 in | Wt 172.6 lb

## 2023-01-17 DIAGNOSIS — I4729 Other ventricular tachycardia: Secondary | ICD-10-CM | POA: Diagnosis not present

## 2023-01-17 DIAGNOSIS — I10 Essential (primary) hypertension: Secondary | ICD-10-CM | POA: Diagnosis not present

## 2023-01-17 DIAGNOSIS — I495 Sick sinus syndrome: Secondary | ICD-10-CM | POA: Diagnosis not present

## 2023-01-17 DIAGNOSIS — N1832 Chronic kidney disease, stage 3b: Secondary | ICD-10-CM

## 2023-01-17 DIAGNOSIS — I480 Paroxysmal atrial fibrillation: Secondary | ICD-10-CM | POA: Diagnosis not present

## 2023-01-17 DIAGNOSIS — D6869 Other thrombophilia: Secondary | ICD-10-CM | POA: Diagnosis not present

## 2023-01-17 DIAGNOSIS — I441 Atrioventricular block, second degree: Secondary | ICD-10-CM

## 2023-01-17 DIAGNOSIS — I700 Atherosclerosis of aorta: Secondary | ICD-10-CM | POA: Diagnosis not present

## 2023-01-17 DIAGNOSIS — I25118 Atherosclerotic heart disease of native coronary artery with other forms of angina pectoris: Secondary | ICD-10-CM | POA: Diagnosis not present

## 2023-01-17 DIAGNOSIS — R7303 Prediabetes: Secondary | ICD-10-CM

## 2023-01-17 DIAGNOSIS — I5042 Chronic combined systolic (congestive) and diastolic (congestive) heart failure: Secondary | ICD-10-CM | POA: Diagnosis not present

## 2023-01-17 DIAGNOSIS — Z95810 Presence of automatic (implantable) cardiac defibrillator: Secondary | ICD-10-CM

## 2023-01-17 NOTE — Patient Instructions (Addendum)
Medication Instructions:  No changes *If you need a refill on your cardiac medications before your next appointment, please call your pharmacy*  Follow-Up: At [facility], you and your health needs are our priority.  As part of our continuing mission to provide you with exceptional heart care, we have created designated Provider Care Teams.  These Care Teams include your primary Cardiologist (physician) and Advanced Practice Providers (APPs -  Physician Assistants and Nurse Practitioners) who all work together to provide you with the care you need, when you need it.  We recommend signing up for the patient portal called "MyChart".  Sign up information is provided on this After Visit Summary.  MyChart is used to connect with patients for Virtual Visits (Telemedicine).  Patients are able to view lab/test results, encounter notes, upcoming appointments, etc.  Non-urgent messages can be sent to your provider as well.   To learn more about what you can do with MyChart, go to [email or web address].    Your next appointment:   1 year(s)  device day  Provider:   Dr Sallyanne Kuster

## 2023-01-17 NOTE — Progress Notes (Unsigned)
Cardiology Office Note:    Date:  01/20/2023   ID:  [name], DOB [birth year], MRN [id number]  PCP:  Lucianne Lei, MD  Cardiologist: Claiborne Billings Referring MD: Lucianne Lei, MD   Chief Complaint  Patient presents with   ICD check     History of Present Illness:    ORAN RANJEL is a 82 y.o. male with a hx of dilated cardiomyopathy (EF as low as 20-25% in 2012, improved, last echo August 2021 LVEF 40-45%.), history of ventricular tachycardia ablation ([place], July 2013), coronary artery disease with a non-STEMI 2012, nonobstructive CAD by cath (low risk nuclear stress test May 2016, normal perfusion, runs of nonsustained VT, EF 51%), status post implantation of a dual-chamber permanent pacemaker for sinus bradycardia and second-degree AV block Mobitz type I (August 2019, [place]), asymptomatic paroxysmal atrial fibrillation recorded by pacemaker, presenting with ventricular tachycardia and syncope in December 2020, underwent upgrade to CRT-D device ([place], new [place] ICD lead, Dr. Lovena Le).  He is doing well.  Denies angina at rest or with activity.  Appears to have NYHA functional class II dyspnea.  He denies orthopnea, PND or lower extremity edema.  Denies palpitations, dizziness, syncope or defibrillator discharges.  He has not required hospitalization for heart failure or adjustment in his diuretics, although he was seen in the emergency room a few months ago with cystitis, flank pain and hematuria.  ICDinterrogation shows reassuring stable findings.  Estimated generator longevity is 3.6-3.9 years.  He has 92% atrial pacing and 95% biventricular pacing.  For the most part his thoracic impedance has been in normal range with the exception of about a week in late January with spontaneous resolution.  His LV lead is programmed LV mid 3-RV coil and has stable pacing thresholds.  His burden of atrial fibrillation is only 1.2% and ventricular rate control is fair.   The arrhythmia is asymptomatic.  In fact he had a 3.5-hour episode of atrial fibrillation this morning that he was unaware of.  He converted to normal rhythm just before getting to see [place] in clinic.  Heart failure therapy includes the maximum tolerated doses of Entresto, spironolactone, metoprolol succinate.  He is on anticoagulation with apixaban.  Metabolic parameters are good with an LDL of 55, hemoglobin A1c 5.9% and a stable creatinine at 1.68.  Past Medical History:  Diagnosis Date   AICD (automatic cardioverter/defibrillator) present    CAD (coronary artery disease) 80% stenosis diag of the LAD, 30% in OM2 branch of LCX in 2009    a. Nonobstructive CAD by cath 11/2011 with the exception of the pre-existing diagonal branch #2 lesion.   Chronic systolic CHF (congestive heart failure) (HCC)    CKD (chronic kidney disease) stage 3, GFR 30-59 ml/min (HCC)    Colon polyp, hyperplastic    History of stress test 06/01/2012   Normal myocardial perfusion study. compared to the previous study there is no significant change. this is a low risk scan   Hypertension    Legally blind    "both eyes"   Myocardial infarction [facility]) 11/22/2011   NICM (nonischemic cardiomyopathy) ([place])    a. Remote hx of dilated NICM with EF ranging 20-45%, including normal EF by echo (55-60%) in 2014.   Peripheral arterial disease ([place])    a. 06/2014: ABI right 0.99, left 1.2, LE dopplers revealing an occluded right posterior tibial. As symptoms were not felt r/t claudication, no further w/u at the time.   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 Requiring discontinuation of BB dose.   Spondylolisthesis    Ventricular bigeminy    Ventricular tachycardia (paroxysmal) ([place]) 04/11/2015    Past Surgical History:  Procedure Laterality Date   BACK SURGERY     BIV UPGRADE N/A 11/08/2019   Procedure: BIV UPGRADE;  Surgeon: Evans Lance, MD;  Location:  [place] [facility];  Service: Cardiovascular;  Laterality: N/A;   CARDIAC CATHETERIZATION  11/2011   CARDIAC CATHETERIZATION  11/2011   didn't demonstrate high grade obstructive disease to account for his LV dysfunction.   CATARACT EXTRACTION, BILATERAL  1990's   CYSTOSCOPY     CYSTOSCOPY WITH BIOPSY Bilateral 08/03/2021   Procedure: CYSTOSCOPY WITH BLADDER BIOPSY, BILATERAL RETROGRADE PYELOGRAM;  Surgeon: Lucas Mallow, MD;  Location: [facility];  Service: Urology;  Laterality: Bilateral;  REQUESTING [place] N/A 05/04/2013   Procedure: CYSTOSCOPY WITH URETHRAL DILATATION;  Surgeon: Eustace Moore, MD;  Location: [place]UR[facility] OR[facility];  Service: Neurosurgery;  Laterality: N/A;  with insertion of foley catheter   EP study and ablation of VT  7/13   PVC focus mapped to the right coronary cusp of the aorta, limited ablation performed due to proximity of the focus to the right coronary artery   EYE SURGERY     LEFT HEART CATH AND CORONARY ANGIOGRAPHY N/A 11/07/2019   Procedure: LEFT HEART CATH AND CORONARY ANGIOGRAPHY;  Surgeon: Wellington Hampshire, MD;  Location: [place] [facility];  Service: Cardiovascular;  Laterality: N/A;   LEFT HEART CATHETERIZATION WITH CORONARY ANGIOGRAM N/A 11/25/2011   Procedure: LEFT HEART CATHETERIZATION WITH CORONARY ANGIOGRAM;  Surgeon: Leonie Man, MD;  Location: [facility] CATH LAB;  Service: Cardiovascular;  Laterality: N/A;   PACEMAKER IMPLANT N/A 07/12/2018   Procedure: PACEMAKER IMPLANT;  Surgeon: Sanda Klein, MD;  Location: [place] [facility] LAB;  Service: Cardiovascular;  Laterality: N/A;   POSTERIOR FUSION LUMBAR SPINE  1979   ROTATOR CUFF REPAIR  2000's   left   V-TACH ABLATION N/A 06/06/2012   Procedure: V-TACH ABLATION;  Surgeon: Thompson Grayer, MD;  Location: [facility] CATH LAB;  Service: Cardiovascular;  Laterality: N/A;    Current Medications: Current Meds  Medication Sig   ALLERGY RELIEF 180 MG tablet Take 180 mg by mouth daily.    apixaban (ELIQUIS) 2.5 MG TABS tablet Take 1 tablet (2.5 mg total) by mouth 2 (two) times daily.   bimatoprost (LUMIGAN) 0.01 % SOLN Place 1 drop into both eyes at bedtime.   BREO ELLIPTA 100-25 MCG/INH AEPB Inhale 1 puff into the lungs every morning.   dorzolamide-timolol (COSOPT) 22.3-6.8 MG/ML ophthalmic solution Place 1 drop into both eyes 2 (two) times daily.   furosemide (LASIX) 20 MG tablet Take 20 mg by mouth every other day.   isosorbide mononitrate (IMDUR) 30 MG 24 hr tablet Take 30 mg by mouth daily.   metoprolol succinate (TOPROL-XL) 100 MG 24 hr tablet TAKE 1 TABLET(100 MG) BY MOUTH DAILY WITH OR IMMEDIATELY FOLLOWING A MEAL   sacubitril-valsartan (ENTRESTO) 24-26 MG Take 1 tablet by mouth 2 (two) times daily.   spironolactone (ALDACTONE) 25 MG tablet Take 12.5 mg by mouth daily.     Allergies:   Patient has no known allergies.   Social History   Socioeconomic History   Marital status: Legally Separated    Spouse name: Not on file   Number of children:  3   Years of education: Not on file   Highest education level: Not on file  Occupational History   Occupation: Retired    Fish farm manager: RETIRED  Tobacco Use   Smoking status: Former    Years: 50.00    Types: Cigarettes    Quit date: 11/23/1999    Years since quitting: 23.1   Smokeless tobacco: Never  Vaping Use   Vaping Use: Never used  Substance and Sexual Activity   Alcohol use: No    Comment: 05/26/12 "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; stopped drinking in the early 1980's"   Drug use: No   Sexual activity: Yes  Other Topics Concern   Not on file  Social History Narrative   Not on file   Social Determinants of Health   Financial Resource Strain: Not on file  Food Insecurity: No Food Insecurity (02/24/2021)   Hunger Vital Sign    Worried About Running Out of Food in the Last Year: Never true    [place] in the Last Year: Never true  Transportation Needs: No Transportation Needs (02/24/2021)   PRAPARE - Armed forces logistics/support/administrative officer (Medical): No    Lack of Transportation (Non-Medical): No  Physical Activity: Not on file  Stress: Not on file  Social Connections: Not on file     Family History: The patient's family history includes Asthma in his mother; Other in an other family member.  ROS:   Please see the history of present illness.    All other systems are reviewed and are negative.   EKGs/Labs/Other Studies Reviewed:    The following studies were reviewed today: Comprehensive pacemaker check in the office today  EKG:  EKG is ordered today.  It shows AV sequentially paced rhythm positive R wave in lead V1.  Recent Labs: 07/28/2022: Hemoglobin 13.1; Platelets 138 11/11/2022: BUN 18; Creatinine, Ser 1.68; Potassium 3.9; Sodium 142    Recent Lipid Panel    Component Value Date/Time   CHOL 118 01/12/2022 0916   TRIG 46 01/12/2022 0916   HDL 47 01/12/2022 0916   CHOLHDL 2.5 01/12/2022 0916   CHOLHDL 2.2 01/24/2017 0950   VLDL 8 01/24/2017 0950   LDLCALC 60 01/12/2022 0916    Physical Exam:    VS:  BP 124/70 (BP Location: Left Arm, Patient Position: Sitting, Cuff Size: Normal)   Pulse (!) 58   Ht '5\' 8"'$  (1.727 m)   Wt 172 lb 9.6 oz (78.3 kg)   SpO2 95%   BMI 26.24 kg/m     Wt Readings from Last 3 Encounters:  01/17/23 172 lb 9.6 oz (78.3 kg)  11/11/22 170 lb 9.6 oz (77.4 kg)  10/04/22 169 lb 9.6 oz (76.9 kg)     General: Alert, oriented x3, no distress, healthy left subclavian device site. Head: no evidence of trauma, PERRL, EOMI, no exophtalmos or lid lag, no myxedema, no xanthelasma; normal ears, nose and oropharynx Neck: normal jugular venous pulsations and no hepatojugular reflux; brisk carotid pulses without delay and no carotid bruits Chest: clear to auscultation, no signs of consolidation by percussion or palpation, normal fremitus, symmetrical and full respiratory excursions Cardiovascular: normal position and quality of the apical impulse, regular rhythm,  normal first and limit second heart sounds, no murmurs, rubs or gallops Abdomen: no tenderness or distention, no masses by palpation, no abnormal pulsatility or arterial bruits, normal bowel sounds, no hepatosplenomegaly Extremities: no clubbing, cyanosis or edema; 2+ radial, ulnar and brachial pulses bilaterally;  2+ right femoral, posterior tibial and dorsalis pedis pulses; 2+ left femoral, posterior tibial and dorsalis pedis pulses; no subclavian or femoral bruits Neurological: grossly nonfocal Psych: Normal mood and affect      ASSESSMENT:    1. Chronic combined systolic and diastolic heart failure (HCC)   2. Paroxysmal atrial fibrillation ([place])   3. Acquired thrombophilia ([place])   4. Second degree AV block, Mobitz type I   5. Biventricular automatic implantable cardioverter defibrillator in situ   6. SSS (sick sinus syndrome) ([place])   7. Coronary artery disease of native artery of native heart with stable angina pectoris ([place])   8. NSVT (nonsustained ventricular tachycardia) (HCC)   9. Essential hypertension   10. Stage 3b chronic kidney disease ([place])   11. Atherosclerosis of aorta ([place])   12. Prediabetes      PLAN:    In order of problems listed above:  HFrEF: Clinically euvolemic.  He did have deviation of his thoracic impedance about a month ago, that resolved spontaneously.  NYHA functional class II.  On maximum tolerated doses of Entresto, spironolactone, metoprolol succinate.  Not on SGLT2 inhibitor.  Does have some cost constraints and prefers no change to his medications at this time.. Paroxysmal atrial fibrillation: Burden of arrhythmia remains low at around 1%, rate control is acceptable and he is asymptomatic.Marland Kitchen  Appropriately anticoagulated. CHADSVasc 5 (age 72, HTN, CHF, CAD).   Anticoagulation: Had remote problems with epistaxis and some hematuria with his episode of cystitis a few months ago, but no serious bleeding.  Eliquis dose is adjusted for age and renal  dysfunction. 2nd deg AVB: Not entirely pacemaker dependent. CRT-D: Has always had around 95% biventricular pacing due to frequent PVCs.  The atrial fib has really not interfered much with the CRT.  Otherwise normal device function.  Continue remote downloads every 3 months. SSS: Mostly atrial paced rhythm, with good heart rate histogram distribution. CAD: Significant stenoses were seen only secondary coronary branches.  He does not have angina pectoris on combined antianginal therapy with nitrates and beta-blockers. NSVT: Syncope related to VT last December 2021.  Had a VT ablation in 2013.  Currently with very rare episodes of nonsustained VT.  None are seen on the most recent ICD monitoring period. HTN: Blood pressure in desirable range. CKD 3: Creatinine has been stable in the 1.5-1.6 range.  GFR 40-45. Ao atherosclerosis: incidentally noted on CT, normal caliber aorta. preDM: Most recent hemoglobin A1c 5.9% and most recent LDL 55.     Medication Adjustments/Labs and Tests Ordered: Current medicines are reviewed at length with the patient today.  Concerns regarding medicines are outlined above.  No orders of the defined types were placed in this encounter.   No orders of the defined types were placed in this encounter.    Patient Instructions  Medication Instructions:  No changes *If you need a refill on your cardiac medications before your next appointment, please call your pharmacy*  Follow-Up: At [facility], you and your health needs are our priority.  As part of our continuing mission to provide you with exceptional heart care, we have created designated Provider Care Teams.  These Care Teams include your primary Cardiologist (physician) and Advanced Practice Providers (APPs -  Physician Assistants and Nurse Practitioners) who all work together to provide you with the care you need, when you need it.  We recommend signing up for the patient portal called "MyChart".   Sign up information is provided on this After Visit  Summary.  MyChart is used to connect with patients for Virtual Visits (Telemedicine).  Patients are able to view lab/test results, encounter notes, upcoming appointments, etc.  Non-urgent messages can be sent to your provider as well.   To learn more about what you can do with MyChart, go to [email or web address].    Your next appointment:   1 year(s)  device day  Provider:   Dr Sallyanne Kuster      Signed, Sanda Klein, MD  01/20/2023 7:21 PM    [place]

## 2023-01-18 ENCOUNTER — Telehealth: Payer: Self-pay

## 2023-01-18 NOTE — Telephone Encounter (Signed)
The patient agreed to come to the office to get help with his [facility] pulse app.

## 2023-01-18 NOTE — Telephone Encounter (Signed)
-----   Message from Damian Leavell, RN sent at 01/17/2023 10:08 AM EST -----  ----- Message ----- From: Sanda Klein, MD Sent: 01/17/2023   9:54 AM EST To: [facility]  Not sure why we have not been receiving remotes since last August. Please troubleshoot.   Full check today in office.

## 2023-01-20 ENCOUNTER — Encounter: Payer: Self-pay | Admitting: Cardiovascular Disease

## 2023-01-20 DIAGNOSIS — E785 Hyperlipidemia, unspecified: Secondary | ICD-10-CM | POA: Diagnosis not present

## 2023-01-20 DIAGNOSIS — I130 Hypertensive heart and chronic kidney disease with heart failure and stage 1 through stage 4 chronic kidney disease, or unspecified chronic kidney disease: Secondary | ICD-10-CM | POA: Diagnosis not present

## 2023-01-20 DIAGNOSIS — I5032 Chronic diastolic (congestive) heart failure: Secondary | ICD-10-CM | POA: Diagnosis not present

## 2023-01-20 DIAGNOSIS — N1831 Chronic kidney disease, stage 3a: Secondary | ICD-10-CM | POA: Diagnosis not present

## 2023-01-25 ENCOUNTER — Ambulatory Visit (INDEPENDENT_AMBULATORY_CARE_PROVIDER_SITE_OTHER): Payer: 59

## 2023-01-25 DIAGNOSIS — I441 Atrioventricular block, second degree: Secondary | ICD-10-CM | POA: Diagnosis not present

## 2023-01-25 LAB — CUP PACEART REMOTE DEVICE CHECK
Date Time Interrogation Session: 20240305135847
Implantable Lead Connection Status: 753985
Implantable Lead Connection Status: 753985
Implantable Lead Connection Status: 753985
Implantable Lead Implant Date: 20190821
Implantable Lead Implant Date: 20201217
Implantable Lead Implant Date: 20201217
Implantable Lead Location: 753858
Implantable Lead Location: 753859
Implantable Lead Location: 753860
Implantable Pulse Generator Implant Date: 20201217
Pulse Gen Serial Number: 111014567

## 2023-02-17 DIAGNOSIS — I5042 Chronic combined systolic (congestive) and diastolic (congestive) heart failure: Secondary | ICD-10-CM | POA: Diagnosis not present

## 2023-02-17 DIAGNOSIS — N189 Chronic kidney disease, unspecified: Secondary | ICD-10-CM | POA: Diagnosis not present

## 2023-02-17 DIAGNOSIS — J4520 Mild intermittent asthma, uncomplicated: Secondary | ICD-10-CM | POA: Diagnosis not present

## 2023-02-17 DIAGNOSIS — I130 Hypertensive heart and chronic kidney disease with heart failure and stage 1 through stage 4 chronic kidney disease, or unspecified chronic kidney disease: Secondary | ICD-10-CM | POA: Diagnosis not present

## 2023-02-17 DIAGNOSIS — T7840XD Allergy, unspecified, subsequent encounter: Secondary | ICD-10-CM | POA: Diagnosis not present

## 2023-02-17 DIAGNOSIS — I110 Hypertensive heart disease with heart failure: Secondary | ICD-10-CM | POA: Diagnosis not present

## 2023-02-17 DIAGNOSIS — I10 Essential (primary) hypertension: Secondary | ICD-10-CM | POA: Diagnosis not present

## 2023-03-04 NOTE — Progress Notes (Signed)
Remote ICD transmission.   

## 2023-03-23 ENCOUNTER — Ambulatory Visit (INDEPENDENT_AMBULATORY_CARE_PROVIDER_SITE_OTHER): Payer: 59 | Admitting: Podiatry

## 2023-03-23 ENCOUNTER — Encounter: Payer: Self-pay | Admitting: Podiatry

## 2023-03-23 DIAGNOSIS — M79674 Pain in right toe(s): Secondary | ICD-10-CM

## 2023-03-23 DIAGNOSIS — M79675 Pain in left toe(s): Secondary | ICD-10-CM

## 2023-03-23 DIAGNOSIS — D689 Coagulation defect, unspecified: Secondary | ICD-10-CM | POA: Diagnosis not present

## 2023-03-23 DIAGNOSIS — B351 Tinea unguium: Secondary | ICD-10-CM

## 2023-03-23 NOTE — Progress Notes (Signed)
This patient returns to my office for at risk foot care.  This patient requires this care by a professional since this patient will be at risk due to having , PAD and  Chronic kidney disease.  .  This patient is unable to cut nails himself since the patient cannot reach his nails.These nails are painful walking and wearing shoes.  This patient presents for at risk foot care today.  General Appearance  Alert, conversant and in no acute stress.  Vascular  Dorsalis pedis and posterior tibial  pulses are palpable  bilaterally.  Capillary return is within normal limits  bilaterally. Temperature is within normal limits  bilaterally.  Neurologic  Senn-Weinstein monofilament wire test within normal limits  bilaterally. Muscle power within normal limits bilaterally.  Nails Thick disfigured discolored nails with subungual debris  from hallux to fifth toes bilaterally. No evidence of bacterial infection or drainage bilaterally.  Orthopedic  No limitations of motion  feet .  No crepitus or effusions noted.  No bony pathology or digital deformities noted.  HAV  B/L.  Skin  normotropic skin with no porokeratosis noted bilaterally.  No signs of infections or ulcers noted.     Onychomycosis  Pain in right toes  Pain in left toes  Consent was obtained for treatment procedures.   Mechanical debridement of nails 1-5  bilaterally performed with a nail nipper.  Filed with dremel without incident.    Return office visit   3 months                   Told patient to return for periodic foot care and evaluation due to potential at risk complications.   Tydus Sanmiguel DPM  

## 2023-03-24 ENCOUNTER — Other Ambulatory Visit: Payer: Self-pay | Admitting: Cardiovascular Disease

## 2023-03-29 DIAGNOSIS — H35033 Hypertensive retinopathy, bilateral: Secondary | ICD-10-CM | POA: Diagnosis not present

## 2023-03-29 DIAGNOSIS — H401133 Primary open-angle glaucoma, bilateral, severe stage: Secondary | ICD-10-CM | POA: Diagnosis not present

## 2023-03-29 DIAGNOSIS — H04123 Dry eye syndrome of bilateral lacrimal glands: Secondary | ICD-10-CM | POA: Diagnosis not present

## 2023-03-29 DIAGNOSIS — H524 Presbyopia: Secondary | ICD-10-CM | POA: Diagnosis not present

## 2023-03-29 DIAGNOSIS — H47013 Ischemic optic neuropathy, bilateral: Secondary | ICD-10-CM | POA: Diagnosis not present

## 2023-04-11 NOTE — Progress Notes (Unsigned)
Cardiology Clinic Note   Date: 04/12/2023 ID: [name], DOB 1941/05/08, MRN [id number]  Primary Cardiologist:  Nicki Guadalajara, MD  Patient Profile    [name] is a 82 y.o. male who presents to the clinic today for routine follow up.   Past medical history significant for: Nonobstructive CAD. LHC 11/25/2011: Nonobstructive CAD with the exception of pre-existing diagonal lesion as previously described. LHC 11/07/2019 (unstable angina): Proximal to mid LAD 30%.  D1 80%.  Stable stenosis D1 unchanged from previous catheterization 2009. Chronic systolic heart failure/nonischemic cardiomyopathy. Echo 01/26/2022: EF 35 to 40%.  RWMA.  Severe akinesis of the left ventricle, basal-mid inferior wall and inferior lateral wall.  Grade 2 DD.  Normal RV function.  Severe LAE.  Mild MR.  Moderate TR. Ventricular tachycardia. V. tach ablation 06/06/2012. Sinus bradycardia/second-degree AV block. PPM implantation 07/12/2018. PAF. PAD. Hypertension. Hyperlipidemia. Lipid panel 02/17/2023: LDL 61, HDL 40, TG 103, total 120. COPD. GERD. CKD stage III. Syncope/V. tach. PPM upgrade to CRT-D 11/08/2019. Remote device check 01/25/2023: Normal device function. 1.1% A-fib burden.  Good battery status.  Stable lead measurements.   History of Present Illness    [name] is a longtime patient of cardiology.  He is followed by Dr. Tresa Endo and Dr. Royann Shivers for the above outlined history.  Patient was last seen in the office by Bettina Gavia, PA-C on 07/29/2022 for follow-up.  He had recently undergone an ER visit for hematuria and was diagnosed with cystitis and started on antibiotic.  He was instructed to stop aspirin given nonobstructive CAD but patient was insistent on continuing it.  He otherwise was doing well and all medications were continued.  Last in office device check with Dr. Royann Shivers 01/17/2023.  Today, patient is here alone today. He reports he is doing well. No chest pain, pressure,  or tightness. Denies lower extremity edema, orthopnea, or PND. Occasional palpitations described as a skipped beat but no sustained symptoms. He reports intermittent episodes of shortness of breath and DOE. He cannot describe frequency or duration but states it does not happen all of the time and only lasts for a short period of time. Symptoms are relieved with rest and albuterol inhaler. He has never seen a pulmonologist. He weighs every other day and it has been stable.    ROS: All other systems reviewed and are otherwise negative except as noted in History of Present Illness.  Studies Reviewed    ECG is not ordered today.   Risk Assessment/Calculations     CHA2DS2-VASc Score = 5   This indicates a 7.2% annual risk of stroke. The patient's score is based upon: CHF History: 1 HTN History: 1 Diabetes History: 0 Stroke History: 0 Vascular Disease History: 1 Age Score: 2 Gender Score: 0             Physical Exam    VS:  BP 138/78   Pulse 65   Ht 5\' 8"  (1.727 m)   Wt 171 lb 3.2 oz (77.7 kg)   SpO2 97%   BMI 26.03 kg/m  , BMI Body mass index is 26.03 kg/m.  GEN: Well nourished, well developed, in no acute distress. Neck: No JVD or carotid bruits. Cardiac:  RRR. No murmurs. No rubs or gallops.   Respiratory:  Respirations regular and unlabored. Clear to auscultation without rales, wheezing or rhonchi. GI: Soft, nontender, nondistended. Extremities: Radials/DP/PT 2+ and equal bilaterally. No clubbing or cyanosis. No edema.  Skin: Warm and dry, no rash.  Neuro: Strength intact.  Assessment & Plan    Chronic systolic heart failure/ischemic cardiomyopathy.  Echo March 2023 showed EF 35 to 40%.  Patient denies lower extremity edema, abdominal fullness/bloating, early satiety.  Patient reports intermittent increased shortness of breath/DOE relieved with albuterol inhaler.  He has never been evaluated by pulmonology.  No lower extremity edema. Euvolemic and well compensated on  exam.  I do not think his intermittent shortness of breath warrants further cardiac testing at this time.  Will refer to pulmonology for further evaluation.  Continue Entresto, metoprolol, spironolactone, Lasix, isosorbide. Nonobstructive CAD.  PAF.  CRT-D check March 2024 showed 1.1% A-fib burden.  Patient reports he rarely feels palpitations described as a skipped beat. He denies sustained palpitations.Reports occasional bloody nose but does not have prolonged bleeding, otherwise no other bleeding concerns.   Continue metoprolol and Eliquis. Appropriate Eliquis dose.   Hypertension. BP today 138/78.  Patient denies headaches, dizziness or vision changes. Continue Entresto, metoprolol, isosorbide.  Disposition: Refer to pulmonology. Return in 6 months or sooner as needed.          Signed, Etta Grandchild. Labrian Torregrossa, DNP, NP-C

## 2023-04-12 ENCOUNTER — Ambulatory Visit: Payer: 59 | Attending: Student | Admitting: Student

## 2023-04-12 ENCOUNTER — Encounter: Payer: Self-pay | Admitting: Student

## 2023-04-12 VITALS — BP 138/78 | HR 65 | Ht 68.0 in | Wt 171.2 lb

## 2023-04-12 DIAGNOSIS — I5022 Chronic systolic (congestive) heart failure: Secondary | ICD-10-CM | POA: Diagnosis not present

## 2023-04-12 DIAGNOSIS — Z8709 Personal history of other diseases of the respiratory system: Secondary | ICD-10-CM | POA: Diagnosis not present

## 2023-04-12 NOTE — Patient Instructions (Signed)
Medication Instructions:  Your physician recommends that you continue on your current medications as directed. Please refer to the Current Medication list given to you today.  *If you need a refill on your cardiac medications before your next appointment, please call your pharmacy*   Lab Work: NONE If you have labs (blood work) drawn today and your tests are completely normal, you will receive your results only by: MyChart Message (if you have MyChart) OR A paper copy in the mail If you have any lab test that is abnormal or we need to change your treatment, we will call you to review the results.   Testing/Procedures: NONE   Follow-Up: At [facility], you and your health needs are our priority.  As part of our continuing mission to provide you with exceptional heart care, we have created designated Provider Care Teams.  These Care Teams include your primary Cardiologist (physician) and Advanced Practice Providers (APPs -  Physician Assistants and Nurse Practitioners) who all work together to provide you with the care you need, when you need it.  We recommend signing up for the patient portal called "MyChart".  Sign up information is provided on this After Visit Summary.  MyChart is used to connect with patients for Virtual Visits (Telemedicine).  Patients are able to view lab/test results, encounter notes, upcoming appointments, etc.  Non-urgent messages can be sent to your provider as well.   To learn more about what you can do with MyChart, go to [email or web address].    Your next appointment:   6 month(s)  Provider:   Thomas Kelly, MD   

## 2023-04-25 ENCOUNTER — Other Ambulatory Visit: Payer: Self-pay | Admitting: Cardiovascular Disease

## 2023-04-26 DIAGNOSIS — H401133 Primary open-angle glaucoma, bilateral, severe stage: Secondary | ICD-10-CM | POA: Diagnosis not present

## 2023-04-26 DIAGNOSIS — H47013 Ischemic optic neuropathy, bilateral: Secondary | ICD-10-CM | POA: Diagnosis not present

## 2023-04-29 ENCOUNTER — Other Ambulatory Visit: Payer: Self-pay

## 2023-04-29 ENCOUNTER — Observation Stay (HOSPITAL_COMMUNITY)
Admission: EM | Admit: 2023-04-29 | Discharge: 2023-04-30 | Disposition: A | Discharge status: Home / Self Care | Payer: 59 | Attending: Internal Medicine | Admitting: Internal Medicine

## 2023-04-29 ENCOUNTER — Emergency Department (HOSPITAL_COMMUNITY): Payer: 59

## 2023-04-29 ENCOUNTER — Telehealth: Payer: Self-pay | Admitting: Cardiovascular Disease

## 2023-04-29 DIAGNOSIS — R072 Precordial pain: Secondary | ICD-10-CM | POA: Diagnosis not present

## 2023-04-29 DIAGNOSIS — Z950 Presence of cardiac pacemaker: Secondary | ICD-10-CM | POA: Diagnosis not present

## 2023-04-29 DIAGNOSIS — G8929 Other chronic pain: Secondary | ICD-10-CM | POA: Diagnosis not present

## 2023-04-29 DIAGNOSIS — I10 Essential (primary) hypertension: Secondary | ICD-10-CM | POA: Diagnosis present

## 2023-04-29 DIAGNOSIS — Z7901 Long term (current) use of anticoagulants: Secondary | ICD-10-CM | POA: Insufficient documentation

## 2023-04-29 DIAGNOSIS — I5022 Chronic systolic (congestive) heart failure: Secondary | ICD-10-CM | POA: Insufficient documentation

## 2023-04-29 DIAGNOSIS — R079 Chest pain, unspecified: Secondary | ICD-10-CM

## 2023-04-29 DIAGNOSIS — Z87891 Personal history of nicotine dependence: Secondary | ICD-10-CM | POA: Diagnosis not present

## 2023-04-29 DIAGNOSIS — I130 Hypertensive heart and chronic kidney disease with heart failure and stage 1 through stage 4 chronic kidney disease, or unspecified chronic kidney disease: Secondary | ICD-10-CM | POA: Insufficient documentation

## 2023-04-29 DIAGNOSIS — N1832 Chronic kidney disease, stage 3b: Secondary | ICD-10-CM | POA: Diagnosis present

## 2023-04-29 DIAGNOSIS — Z79899 Other long term (current) drug therapy: Secondary | ICD-10-CM | POA: Diagnosis not present

## 2023-04-29 DIAGNOSIS — I480 Paroxysmal atrial fibrillation: Secondary | ICD-10-CM | POA: Insufficient documentation

## 2023-04-29 DIAGNOSIS — R0789 Other chest pain: Secondary | ICD-10-CM | POA: Diagnosis not present

## 2023-04-29 DIAGNOSIS — I2510 Atherosclerotic heart disease of native coronary artery without angina pectoris: Secondary | ICD-10-CM | POA: Diagnosis not present

## 2023-04-29 DIAGNOSIS — M940 Chondrocostal junction syndrome [Tietze]: Principal | ICD-10-CM | POA: Diagnosis present

## 2023-04-29 DIAGNOSIS — R0602 Shortness of breath: Secondary | ICD-10-CM | POA: Diagnosis not present

## 2023-04-29 LAB — BASIC METABOLIC PANEL
Anion gap: 7 (ref 5–15)
BUN: 18 mg/dL (ref 8–23)
CO2: 26 mmol/L (ref 22–32)
Calcium: 9.2 mg/dL (ref 8.9–10.3)
Chloride: 106 mmol/L (ref 98–111)
Creatinine, Ser: 1.54 mg/dL — ABNORMAL HIGH (ref 0.61–1.24)
GFR, Estimated: 44 mL/min — ABNORMAL LOW (ref >60)
Glucose, Bld: 107 mg/dL — ABNORMAL HIGH (ref 70–99)
Potassium: 4.1 mmol/L (ref 3.5–5.1)
Sodium: 139 mmol/L (ref 135–145)

## 2023-04-29 LAB — URINALYSIS, ROUTINE W REFLEX MICROSCOPIC
Bacteria, UA: NONE SEEN
Bilirubin Urine: NEGATIVE
Glucose, UA: NEGATIVE mg/dL
Ketones, ur: NEGATIVE mg/dL
Nitrite: POSITIVE — AB
Protein, ur: NEGATIVE mg/dL
RBC / HPF: >50 RBC/hpf (ref 0–5)
Specific Gravity, Urine: 1.014 (ref 1.005–1.030)
pH: 7 (ref 5.0–8.0)

## 2023-04-29 LAB — CBC
HCT: 42.9 % (ref 39.0–52.0)
Hemoglobin: 14.1 g/dL (ref 13.0–17.0)
MCH: 30.8 pg (ref 26.0–34.0)
MCHC: 32.9 g/dL (ref 30.0–36.0)
MCV: 93.7 fL (ref 80.0–100.0)
Platelets: 140 10*3/uL — ABNORMAL LOW (ref 150–400)
RBC: 4.58 MIL/uL (ref 4.22–5.81)
RDW: 12.3 % (ref 11.5–15.5)
WBC: 4.8 10*3/uL (ref 4.0–10.5)
nRBC: 0 % (ref 0.0–0.2)

## 2023-04-29 LAB — BRAIN NATRIURETIC PEPTIDE: B Natriuretic Peptide: 399.4 pg/mL — ABNORMAL HIGH (ref 0.0–100.0)

## 2023-04-29 LAB — TROPONIN I (HIGH SENSITIVITY)
Troponin I (High Sensitivity): 16 ng/L (ref <18)
Troponin I (High Sensitivity): 18 ng/L — ABNORMAL HIGH (ref <18)

## 2023-04-29 MED ORDER — NITROGLYCERIN 0.4 MG SL SUBL
0.4000 mg | SUBLINGUAL_TABLET | SUBLINGUAL | Status: DC
  Filled 2023-04-29: qty 1

## 2023-04-29 MED ORDER — FLUTICASONE FUROATE-VILANTEROL 100-25 MCG/ACT IN AEPB
1.0000 | INHALATION_SPRAY | Freq: Every day | RESPIRATORY_TRACT | Status: DC
  Administered 2023-04-30: 1 via RESPIRATORY_TRACT
  Filled 2023-04-29: qty 28

## 2023-04-29 MED ORDER — ALBUTEROL SULFATE (2.5 MG/3ML) 0.083% IN NEBU: 2.5000 mg | INHALATION_SOLUTION | RESPIRATORY_TRACT | Status: DC | PRN

## 2023-04-29 MED ORDER — APIXABAN 2.5 MG PO TABS
2.5000 mg | ORAL_TABLET | Freq: Two times a day (BID) | ORAL | Status: DC
  Administered 2023-04-29 – 2023-04-30 (×2): 2.5 mg via ORAL
  Filled 2023-04-29 (×2): qty 1

## 2023-04-29 MED ORDER — DORZOLAMIDE HCL-TIMOLOL MAL 2-0.5 % OP SOLN
1.0000 [drp] | Freq: Two times a day (BID) | OPHTHALMIC | Status: DC
  Administered 2023-04-29: 1 [drp] via OPHTHALMIC
  Filled 2023-04-29: qty 10

## 2023-04-29 MED ORDER — ONDANSETRON HCL 4 MG PO TABS: 4.0000 mg | ORAL_TABLET | Freq: Four times a day (QID) | ORAL | Status: DC | PRN

## 2023-04-29 MED ORDER — ACETAMINOPHEN 325 MG PO TABS: 650.0000 mg | ORAL_TABLET | Freq: Four times a day (QID) | ORAL | Status: DC | PRN

## 2023-04-29 MED ORDER — SACUBITRIL-VALSARTAN 24-26 MG PO TABS
1.0000 | ORAL_TABLET | Freq: Two times a day (BID) | ORAL | Status: DC
  Administered 2023-04-29 – 2023-04-30 (×2): 1 via ORAL
  Filled 2023-04-29 (×2): qty 1

## 2023-04-29 MED ORDER — LATANOPROST 0.005 % OP SOLN
1.0000 [drp] | Freq: Every day | OPHTHALMIC | Status: DC
  Administered 2023-04-29: 1 [drp] via OPHTHALMIC
  Filled 2023-04-29: qty 2.5

## 2023-04-29 MED ORDER — LACTATED RINGERS IV SOLN: INTRAVENOUS | Status: DC

## 2023-04-29 MED ORDER — TRAZODONE HCL 50 MG PO TABS: 25.0000 mg | ORAL_TABLET | Freq: Every evening | ORAL | Status: DC | PRN

## 2023-04-29 MED ORDER — METOPROLOL SUCCINATE ER 50 MG PO TB24
100.0000 mg | ORAL_TABLET | Freq: Every day | ORAL | Status: DC
  Administered 2023-04-30: 100 mg via ORAL
  Filled 2023-04-29: qty 2

## 2023-04-29 MED ORDER — ACETAMINOPHEN 650 MG RE SUPP: 650.0000 mg | Freq: Four times a day (QID) | RECTAL | Status: DC | PRN

## 2023-04-29 MED ORDER — METOPROLOL TARTRATE 5 MG/5ML IV SOLN: 5.0000 mg | Freq: Four times a day (QID) | INTRAVENOUS | Status: DC | PRN

## 2023-04-29 MED ORDER — SENNOSIDES-DOCUSATE SODIUM 8.6-50 MG PO TABS: 1.0000 | ORAL_TABLET | Freq: Every evening | ORAL | Status: DC | PRN

## 2023-04-29 MED ORDER — ONDANSETRON HCL 4 MG/2ML IJ SOLN: 4.0000 mg | Freq: Four times a day (QID) | INTRAMUSCULAR | Status: DC | PRN

## 2023-04-29 MED ORDER — FUROSEMIDE 20 MG PO TABS
20.0000 mg | ORAL_TABLET | Freq: Every day | ORAL | Status: DC
  Administered 2023-04-29 – 2023-04-30 (×2): 20 mg via ORAL
  Filled 2023-04-29 (×2): qty 1

## 2023-04-29 MED ORDER — ISOSORBIDE MONONITRATE ER 30 MG PO TB24
30.0000 mg | ORAL_TABLET | Freq: Every day | ORAL | Status: DC
  Administered 2023-04-30: 30 mg via ORAL
  Filled 2023-04-29: qty 1

## 2023-04-29 NOTE — H&P (Addendum)
History and Physical  [name] UUV:[id number] DOB: [birth year] DOA: 04/29/2023  PCP: [name], MD   Chief Complaint: chest pain   HPI: [name] is a 82 y.o. 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  Patient states he has been in his usual state of health until yesterday evening when he was walking at home and had sudden onset of sharp stabbing left-sided chest pain without radiation.  He took a nitroglycerin and went to bed, unsure if the nitroglycerin helped but he slept comfortably through the night, awoke in the morning initially chest pain-free, but the chest pain reoccurred at rest.  No associated cough, shortness of breath, not pleuritic chest pain, no orthopnea, lower extremity edema.  When asked about shortness of breath, patient states that he has been getting gradually more short of breath in the last few months, but nothing acutely.  States that his weight is currently about 168, he weighs himself on a daily basis and was told by his cardiologist that his weight goes into the 170s and then he will go into heart failure.  ED Course: He was evaluated in the emergency department, vital signs are unremarkable, he is stable on room air.  Lab work was done and shows stable CBC and BMP, with his baseline renal function.  Initial troponin is negative, BNP elevated 399.  Patient was given sublingual nitroglycerin in the ER, and started on LR infusion at 125 cc/h.  Hospitalist was contacted for admission.  Review of Systems: Please see HPI for pertinent positives and negatives. A complete 10 system review of systems are otherwise negative.  Past Medical History:  Diagnosis Date   AICD (automatic  cardioverter/defibrillator) present    CAD (coronary artery disease) 80% stenosis diag of the LAD, 30% in OM2 branch of LCX in 2009    a. Nonobstructive CAD by cath 11/2011 with the exception of the pre-existing diagonal branch #2 lesion.   Chronic systolic CHF (congestive heart failure) (HCC)    CKD (chronic kidney disease) stage 3, GFR 30-59 ml/min (HCC)    Colon polyp, hyperplastic    History of stress test 06/01/2012   Normal myocardial perfusion study. compared to the previous study there is no significant change. this is a low risk scan   Hypertension    Legally blind    "both eyes"   Myocardial infarction [facility]) 11/22/2011   NICM (nonischemic cardiomyopathy) (HCC)    a. Remote hx of dilated NICM with EF ranging 20-45%, including normal EF by echo (55-60%) in 2014.   Peripheral arterial disease (HCC)    a. 06/2014: ABI right 0.99, left 1.2, LE dopplers revealing an occluded right posterior tibial. As symptoms were not felt r/t claudication, no further w/u at the time.   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 Requiring discontinuation of BB dose.   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   CATARACT EXTRACTION, BILATERAL  1990's   CYSTOSCOPY     CYSTOSCOPY WITH BIOPSY Bilateral 08/03/2021   Procedure: CYSTOSCOPY WITH BLADDER BIOPSY, BILATERAL RETROGRADE PYELOGRAM;  Surgeon: Crista Elliot, MD;  Location: [facility] ORS;  Service: Urology;  Laterality: Bilateral;  REQUESTING 45 MINS    CYSTOSCOPY WITH URETHRAL DILATATION N/A 05/04/2013   Procedure: CYSTOSCOPY WITH URETHRAL DILATATION;  Surgeon: Tia Alert, MD;  Location: [facility] NEURO ORS;  Service: Neurosurgery;  Laterality: N/A;  with insertion of foley catheter   EP study and ablation of VT  7/13   PVC focus mapped to the right coronary cusp of the aorta, limited ablation performed due to proximity of the focus to the right coronary artery   EYE SURGERY     LEFT HEART CATH AND CORONARY ANGIOGRAPHY N/A 11/07/2019   Procedure: LEFT HEART CATH AND CORONARY ANGIOGRAPHY;  Surgeon: Iran Ouch, MD;  Location: [facility] INVASIVE CV LAB;  Service: Cardiovascular;  Laterality: N/A;   LEFT HEART CATHETERIZATION WITH CORONARY ANGIOGRAM N/A 11/25/2011   Procedure: LEFT HEART CATHETERIZATION WITH CORONARY ANGIOGRAM;  Surgeon: Marykay Lex, MD;  Location: [facility] CATH LAB;  Service: Cardiovascular;  Laterality: N/A;   PACEMAKER IMPLANT N/A 07/12/2018   Procedure: PACEMAKER IMPLANT;  Surgeon: Thurmon Fair, MD;  Location: [facility] INVASIVE CV LAB;  Service: Cardiovascular;  Laterality: N/A;   POSTERIOR FUSION LUMBAR SPINE  1979   ROTATOR CUFF REPAIR  2000's   left   V-TACH ABLATION N/A 06/06/2012   Procedure: V-TACH ABLATION;  Surgeon: Hillis Range, MD;  Location: [facility] CATH LAB;  Service: Cardiovascular;  Laterality: N/A;    Social History:  reports that he quit smoking about 23 years ago. His smoking use included cigarettes. He has never used smokeless tobacco. He reports that he does not drink alcohol and does not use drugs.   No Known Allergies  Family History  Problem Relation Age of Onset   Asthma Mother    Other Other        Unsure if any heart disease in his family.     Prior to Admission medications   Medication Sig Start Date End Date Taking? Authorizing Provider  albuterol (VENTOLIN HFA) 108 (90 Base) MCG/ACT inhaler Inhale 1-2 puffs into the lungs every 6 (six) hours as needed for wheezing or shortness of breath.    [provider]  ALLERGY RELIEF 180 MG tablet Take 180 mg by mouth daily. 01/10/23   [provider]  apixaban (ELIQUIS) 2.5 MG TABS tablet Take 1 tablet (2.5 mg total) by mouth 2 (two) times daily. 11/24/22   Croitoru, Mihai, MD  bimatoprost (LUMIGAN) 0.01 % SOLN Place 1 drop into both eyes at bedtime.    [provider]  BREO ELLIPTA 100-25 MCG/INH AEPB Inhale 1 puff into the lungs every morning. 05/07/20   [provider]  dorzolamide-timolol (COSOPT) 22.3-6.8 MG/ML ophthalmic solution Place 1 drop into both eyes 2 (two) times daily. 03/14/20   [provider]  furosemide (LASIX) 20 MG tablet TAKE 1 TABLET(20 MG) BY MOUTH DAILY 04/25/23   Croitoru, Mihai, MD  isosorbide mononitrate (IMDUR) 30 MG 24 hr tablet Take 30 mg by mouth daily. 02/24/20   [provider]  metoprolol succinate (TOPROL-XL) 100 MG 24 hr tablet TAKE 1 TABLET(100 MG) BY MOUTH DAILY WITH OR IMMEDIATELY FOLLOWING A MEAL 10/19/22  Lennette Bihari, MD  nitroGLYCERIN (NITROSTAT) 0.4 MG SL tablet Place 1 tablet (0.4 mg total) under the tongue every 5 (five) minutes as needed for chest pain. 11/11/22   Croitoru, Mihai, MD  sacubitril-valsartan (ENTRESTO) 24-26 MG Take 1 tablet by mouth 2 (two) times daily. 10/04/22   Croitoru, Mihai, MD  spironolactone (ALDACTONE) 25 MG tablet Take 12.5 mg by mouth daily. 08/31/22   [provider]    Physical Exam: BP (!) 152/104   Pulse 70   Temp (!) 97.5 F (36.4 C) (Oral)   Resp 15   Ht 5\' 8"  (1.727 m)   Wt 76.2 kg   SpO2 99%   BMI 25.54 kg/m   General:  Alert, oriented, calm, in no acute distress  Eyes: EOMI, clear conjuctivae, white sclerea Neck: supple, no masses, trachea mildline, no JVD Cardiovascular: RRR, no murmurs or rubs, no significant lower extremity edema  Respiratory: clear to auscultation bilaterally, no wheezes, no crackles  Abdomen: soft, nontender, nondistended, normal bowel tones heard  Skin: dry, no rashes   Musculoskeletal: no joint effusions, normal range of motion  Psychiatric: appropriate affect, normal speech  Neurologic: extraocular muscles intact, clear speech, moving all extremities with intact sensorium          Labs on Admission:  Basic Metabolic Panel: Recent Labs  Lab 04/29/23 1444  NA 139  K 4.1  CL 106  CO2 26  GLUCOSE 107*  BUN 18  CREATININE 1.54*  CALCIUM 9.2   Liver Function Tests: No results for input(s): "AST", "ALT", "ALKPHOS", "BILITOT", "PROT", "ALBUMIN" in the last 168 hours. No results for input(s): "LIPASE", "AMYLASE" in the last 168 hours. No results for input(s): "AMMONIA" in the last 168 hours. CBC: Recent Labs  Lab 04/29/23 1444  WBC 4.8  HGB 14.1  HCT 42.9  MCV 93.7  PLT 140*   Cardiac Enzymes: No results for input(s): "CKTOTAL", "CKMB", "CKMBINDEX", "TROPONINI" in the last 168 hours.  BNP (last 3 results) Recent Labs    04/29/23 1444  BNP 399.4*    ProBNP (last 3 results) No results for input(s): "PROBNP" in the last 8760 hours.  CBG: No results for input(s): "GLUCAP" in the last 168 hours.  Radiological Exams on Admission: DG Chest 2 View  Result Date: 04/29/2023 CLINICAL DATA:  Left-sided chest pain and shortness of breath. EXAM: CHEST - 2 VIEW COMPARISON:  Chest radiographs 12/19/2019, 11/09/2019; CT chest 09/15/2020; CT abdomen and pelvis without contrast 07/28/2022 FINDINGS: Left chest wall cardiac AICD with leads overlying the right atrium, right ventricle, and coronary sinus. Cardiac silhouette is again at the upper limits of normal size. Mediastinal contours are within normal limits. There is again a nodular density in the region of a lingula overlying the left cardiac border that appears grossly stable from multiple prior remote radiographs including 11/09/2019. Mild bilateral costophrenic angle densities may reflect chronic scarring seen on 07/28/2022 CT. No definite acute airspace opacity. No pleural effusion pneumothorax.  Mild multilevel degenerative disc changes of the thoracic spine. Partially visualized thoracolumbar posterior instrumented fusion hardware. Surgical screw anchors again overlie the left humeral head. IMPRESSION: 1. No acute cardiopulmonary abnormality. 2. Stable nodular density in the region of the lingula compared to multiple prior radiographs including 11/09/2019. Electronically Signed   By: Neita Garnet M.D.   On: 04/29/2023 14:58    Assessment/Plan [name] is a 82 y.o. 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  Chest pain-unclear etiology, initially occurred with exertion.  Initial workup including EKG and troponin negative. -Observation admission -Continue to trend troponin -Sublingual nitroglycerin as needed chest pain -Maintain on telemetry -Continue appropriate cardiac medications as below -Update 2D echo -Discussed with cardiology, requested nonurgent consultation in the morning -Will make n.p.o. after midnight in case further cardiac testing is required  Paroxysmal atrial fibrillation -Continue Toprol-XL -Continue Eliquis  Nonischemic cardiomyopathy-last EF 35% -Discontinued IV fluids -Resume home Lasix 20 mg p.o. daily, patient appears euvolemic despite elevated BNP -Entresto -Toprol-XL -Imdur  CKD 3-renal function appears to be at baseline  DVT prophylaxis: On Eliquis for A-fib.    Code Status: Full Code  Consults called: Cardiology, will see in the morning.  Admission status: Observation  Time spent: 53 minutes  [name] Sharlette Dense MD [facility] Pager [phone]  If 7PM-7AM, please contact night-coverage [email or web address] Password [email or web address]  04/29/2023, 5:18 PM

## 2023-04-29 NOTE — ED Provider Notes (Signed)
[place] EMERGENCY DEPARTMENT AT [facility] Provider Note   CSN: [id number] Arrival date & time: 04/29/23  1320     History  Chief Complaint  Patient presents with   Chest Pain    [name] is a 82 y.o. male.  82 year old male presents with substernal chest pain that began yesterday.  Patient states he was walking outside and felt a pressure in his chest.  Took nitroglycerin and it subsided.  Has some associated increased shortness of breath.  Patient states he is chronically diaphoretic.  Symptoms returned again today about this time at rest.  Once again he took nitroglycerin and symptoms improved.  He denies any respiratory symptoms at this time.  States his pain is still about a 3 out of 10.  Has not been pleuritic.  Has no associated GI symptoms.       Home Medications Prior to Admission medications   Medication Sig Start Date End Date Taking? Authorizing Provider  albuterol (VENTOLIN HFA) 108 (90 Base) MCG/ACT inhaler Inhale 1-2 puffs into the lungs every 6 (six) hours as needed for wheezing or shortness of breath.    [provider]  ALLERGY RELIEF 180 MG tablet Take 180 mg by mouth daily. 01/10/23   [provider]  apixaban (ELIQUIS) 2.5 MG TABS tablet Take 1 tablet (2.5 mg total) by mouth 2 (two) times daily. 11/24/22   Croitoru, Mihai, MD  bimatoprost (LUMIGAN) 0.01 % SOLN Place 1 drop into both eyes at bedtime.    [provider]  BREO ELLIPTA 100-25 MCG/INH AEPB Inhale 1 puff into the lungs every morning. 05/07/20   [provider]  dorzolamide-timolol (COSOPT) 22.3-6.8 MG/ML ophthalmic solution Place 1 drop into both eyes 2 (two) times daily. 03/14/20   [provider]  furosemide (LASIX) 20 MG tablet TAKE 1 TABLET(20 MG) BY MOUTH DAILY 04/25/23   Croitoru, Mihai, MD  isosorbide mononitrate (IMDUR) 30 MG 24 hr tablet Take 30 mg by mouth daily. 02/24/20   [provider]  metoprolol succinate (TOPROL-XL)  100 MG 24 hr tablet TAKE 1 TABLET(100 MG) BY MOUTH DAILY WITH OR IMMEDIATELY FOLLOWING A MEAL 10/19/22   Lennette Bihari, MD  nitroGLYCERIN (NITROSTAT) 0.4 MG SL tablet Place 1 tablet (0.4 mg total) under the tongue every 5 (five) minutes as needed for chest pain. 11/11/22   Croitoru, Mihai, MD  sacubitril-valsartan (ENTRESTO) 24-26 MG Take 1 tablet by mouth 2 (two) times daily. 10/04/22   Croitoru, Mihai, MD  spironolactone (ALDACTONE) 25 MG tablet Take 12.5 mg by mouth daily. 08/31/22   [provider]      Allergies    Patient has no known allergies.    Review of Systems   Review of Systems  All other systems reviewed and are negative.   Physical Exam Updated Vital Signs BP (!) 154/87 (BP Location: Right Arm)   Pulse 72   Temp 98.3 F (36.8 C) (Oral)   Resp 18   Ht 1.727 m (5\' 8" )   Wt 76.2 kg   SpO2 98%   BMI 25.54 kg/m  Physical Exam Vitals and nursing note reviewed.  Constitutional:      General: He is not in acute distress.    Appearance: Normal appearance. He is well-developed. He is not toxic-appearing.  HENT:     Head: Normocephalic and atraumatic.  Eyes:     General: Lids are normal.     Conjunctiva/sclera: Conjunctivae normal.     Pupils: Pupils are equal,  round, and reactive to light.  Neck:     Thyroid: No thyroid mass.     Trachea: No tracheal deviation.  Cardiovascular:     Rate and Rhythm: Normal rate and regular rhythm.     Heart sounds: Normal heart sounds. No murmur heard.    No gallop.  Pulmonary:     Effort: Pulmonary effort is normal. No respiratory distress.     Breath sounds: Normal breath sounds. No stridor. No decreased breath sounds, wheezing, rhonchi or rales.  Abdominal:     General: There is no distension.     Palpations: Abdomen is soft.     Tenderness: There is no abdominal tenderness. There is no rebound.  Musculoskeletal:        General: No tenderness. Normal range of motion.     Cervical back: Normal range of motion and  neck supple.  Skin:    General: Skin is warm and dry.     Findings: No abrasion or rash.  Neurological:     Mental Status: He is alert and oriented to person, place, and time. Mental status is at baseline.     GCS: GCS eye subscore is 4. GCS verbal subscore is 5. GCS motor subscore is 6.     Cranial Nerves: No cranial nerve deficit.     Sensory: No sensory deficit.     Motor: Motor function is intact.  Psychiatric:        Attention and Perception: Attention normal.        Speech: Speech normal.        Behavior: Behavior normal.     ED Results / Procedures / Treatments   Labs (all labs ordered are listed, but only abnormal results are displayed) Labs Reviewed  BASIC METABOLIC PANEL  CBC  URINALYSIS, ROUTINE W REFLEX MICROSCOPIC  BRAIN NATRIURETIC PEPTIDE  TROPONIN I (HIGH SENSITIVITY)    EKG EKG Interpretation  Date/Time:  Friday April 29 2023 13:47:18 EDT Ventricular Rate:  71 PR Interval:  182 QRS Duration: 167 QT Interval:  453 QTC Calculation: 493 R Axis:   263 Text Interpretation: Sinus rhythm Ventricular premature complex Right bundle branch block No significant change since last tracing Confirmed by Lorre Nick ([id number]) on 04/29/2023 2:35:01 PM  Radiology No results found.  Procedures Procedures    Medications Ordered in ED Medications  lactated ringers infusion ( Intravenous New Bag/Given 04/29/23 1445)    ED Course/ Medical Decision Making/ A&P                             Medical Decision Making Amount and/or Complexity of Data Reviewed Labs: ordered. Radiology: ordered.  Risk Prescription drug management.   Patient is EKG per my interpretation shows no acute ischemic changes.  Chest x-ray per interpretation shows no acute findings.  First troponin here is negative.  Patient states that he has basically pain-free.  Patient's BNP is elevated but he has no x-ray findings of CHF.  He does feel slightly more dyspneic.  Has a known history of  cardiomyopathy.  Will give patient dose of nitroglycerin here and admit for further evaluation as he is high risk due to his prior cardiac history        Final Clinical Impression(s) / ED Diagnoses Final diagnoses:  None    Rx / DC Orders ED Discharge Orders     None         Lorre Nick, MD 04/29/23 1629

## 2023-04-29 NOTE — ED Triage Notes (Signed)
Pt arrived POV. C/o L chest pain that began yesterday morning, pt had some improvement after taking nitroglycerin.  Pt has pacemaker   AOx4

## 2023-04-29 NOTE — Telephone Encounter (Signed)
Patient currently having chest pain.  Took nitroglycerin this morning and states better but pain continues.  Took at 6:15am, but has not taken another. Left side chest pain, non radiating.  SOB is a normal for him.  States "always sweat a lot".   Pain started yesterday morning so bad "stopped me to where I couldn't move.  Took a nitro and went to bed. Woke up and was better but still pain. He woke up this morning was OK but got up and started hurting again.  Advised to take another nitro and go to ED for evaluation since day two and pain continues.  Advised in future how to take nitro but also do not lay down and go to bed after, to ensure that pain goes away prior to sleep and ensure not a heart attack.

## 2023-04-29 NOTE — Telephone Encounter (Signed)
Pt c/o of Chest Pain: STAT if CP now or developed within 24 hours  1. Are you having CP right now?  Yes   2. Are you experiencing any other symptoms (ex. SOB, nausea, vomiting, sweating)?  No   3. How long have you been experiencing CP?  Started yesterday   4. Is your CP continuous or coming and going?  Coming and going  5. Have you taken Nitroglycerin?  Yes and it eased the pain ?

## 2023-04-29 NOTE — ED Notes (Signed)
Pt denies any chest pain at this time. Holding nitrostat, informed pt to hit call bell if chest pain returns and nitro will be administered.

## 2023-04-29 NOTE — ED Notes (Signed)
ED TO INPATIENT HANDOFF REPORT  ED Nurse Name and Phone #:  Edyth Gunnels RN [phone]  S Name/Age/Gender [name] 82 y.o. male Room/Bed: WA17/WA17  Code Status   Code Status: Full Code  Home/SNF/Other Home Patient oriented to: self, place, time, and situation Is this baseline? Yes   Triage Complete: Triage complete  Chief Complaint Chest pain [R07.9]  Triage Note Pt arrived POV. C/o L chest pain that began yesterday morning, pt had some improvement after taking nitroglycerin.  Pt has pacemaker   AOx4   Allergies No Known Allergies  Level of Care/Admitting Diagnosis ED Disposition     ED Disposition  Admit   Condition  --   Comment  Hospital Area: [facility] [[id number]]  Level of Care: Telemetry [5]  Admit to tele based on following criteria: Monitor for Ischemic changes  May place patient in observation at [facility] or [facility] if equivalent level of care is available:: Yes  Covid Evaluation: Asymptomatic - no recent exposure (last 10 days) testing not required  Diagnosis: Chest pain [744799]  Admitting Physician: Maryln Gottron [[id number]]  Attending Physician: Kirby Crigler, Parks Neptune [[id number]]          B Medical/Surgery History Past Medical History:  Diagnosis Date   AICD (automatic cardioverter/defibrillator) present    CAD (coronary artery disease) 80% stenosis diag of the LAD, 30% in OM2 branch of LCX in 2009    a. Nonobstructive CAD by cath 11/2011 with the exception of the pre-existing diagonal branch #2 lesion.   Chronic systolic CHF (congestive heart failure) (HCC)    CKD (chronic kidney disease) stage 3, GFR 30-59 ml/min (HCC)    Colon polyp, hyperplastic    History of stress test 06/01/2012   Normal myocardial perfusion study. compared to the previous study there is no significant change. this is a low risk scan   Hypertension    Legally blind    "both eyes"   Myocardial infarction [facility]) 11/22/2011   NICM (nonischemic  cardiomyopathy) (HCC)    a. Remote hx of dilated NICM with EF ranging 20-45%, including normal EF by echo (55-60%) in 2014.   Peripheral arterial disease (HCC)    a. 06/2014: ABI right 0.99, left 1.2, LE dopplers revealing an occluded right posterior tibial. As symptoms were not felt r/t claudication, no further w/u at the time.   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 Requiring discontinuation of BB dose.   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   CATARACT EXTRACTION, BILATERAL  1990's   CYSTOSCOPY     CYSTOSCOPY WITH BIOPSY Bilateral 08/03/2021   Procedure: CYSTOSCOPY WITH BLADDER BIOPSY, BILATERAL RETROGRADE PYELOGRAM;  Surgeon: Crista Elliot, MD;  Location: [facility] ORS;  Service: Urology;  Laterality: Bilateral;  REQUESTING 45 MINS   CYSTOSCOPY WITH URETHRAL DILATATION N/A 05/04/2013   Procedure: CYSTOSCOPY WITH URETHRAL DILATATION;  Surgeon: Tia Alert, MD;  Location: [facility] NEURO ORS;  Service: Neurosurgery;  Laterality:  N/A;  with insertion of foley catheter   EP study and ablation of VT  7/13   PVC focus mapped to the right coronary cusp of the aorta, limited ablation performed due to proximity of the focus to the right coronary artery   EYE SURGERY     LEFT HEART CATH AND CORONARY ANGIOGRAPHY N/A 11/07/2019   Procedure: LEFT HEART CATH AND CORONARY ANGIOGRAPHY;  Surgeon: Iran Ouch, MD;  Location: [facility] INVAS[facility] CV LAB;  Service: Cardiovascular;  Laterality: N/A;   LEFT  HEART CATHETERIZATION WITH CORONARY ANGIOGRAM N/A 11/25/2011   Procedure: LEFT HEART CATHETERIZATION WITH CORONARY ANGIOGRAM;  Surgeon: Marykay Lex, MD;  Location: [facility] CATH LAB;  Service: Cardiovascular;  Laterality: N/A;   PACEMAKER IMPLANT N/A 07/12/2018   Procedure: PACEMAKER IMPLANT;  Surgeon: Thurmon Fair, MD;  Location: [facility] CV LAB;  Service: Cardiovascular;  Laterality: N/A;   POSTERIOR FUSION LUMBAR SPINE  1979   ROTATOR CUFF REPAIR  2000's   left   V-TACH ABLATION N/A 06/06/2012   Procedure: V-TACH ABLATION;  Surgeon: Hillis Range, MD;  Location: [facility] CATH LAB;  Service: Cardiovascular;  Laterality: N/A;     A IV Location/Drains/Wounds Patient Lines/Drains/Airways Status     Active Line/Drains/Airways     Name Placement date Placement time Site Days   Peripheral IV 04/29/23 20 G Anterior;Left;Proximal Forearm 04/29/23  1444  Forearm  less than 1   Incision 11/25/11 Groin Right 11/25/11  --  -- 4173   Incision 05/04/13 Back Bilateral 05/04/13  1426  -- 3647   Incision (Closed) 08/03/21 N/A Other (Comment) 08/03/21  0933  -- 634            Intake/Output Last 24 hours  Intake/Output Summary (Last 24 hours) at 04/29/2023 2023 Last data filed at 04/29/2023 1733 Gross per 24 hour  Intake 350 ml  Output --  Net 350 ml    Labs/Imaging Results for orders placed or performed during the hospital encounter of 04/29/23 (from the past 48 hour(s))  Urinalysis, Routine w reflex microscopic -Urine, Clean Catch     Status: Abnormal   Collection Time: 04/29/23  2:36 PM  Result Value Ref Range   Color, Urine YELLOW YELLOW   APPearance CLEAR CLEAR   Specific Gravity, Urine 1.014 1.005 - 1.030   pH 7.0 5.0 - 8.0   Glucose, UA NEGATIVE NEGATIVE mg/dL   Hgb urine dipstick SMALL (A) NEGATIVE   Bilirubin Urine NEGATIVE NEGATIVE   Ketones, ur NEGATIVE NEGATIVE mg/dL   Protein, ur NEGATIVE NEGATIVE mg/dL   Nitrite POSITIVE (A) NEGATIVE   Leukocytes,Ua SMALL (A) NEGATIVE   RBC /  HPF >50 0 - 5 RBC/hpf   WBC, UA 6-10 0 - 5 WBC/hpf   Bacteria, UA NONE SEEN NONE SEEN   Squamous Epithelial / HPF 0-5 0 - 5 /HPF    Comment: Performed at [facility], [address], [place], [place] [address]  Basic metabolic panel     Status: Abnormal   Collection Time: 04/29/23  2:44 PM  Result Value Ref Range   Sodium 139 135 - 145 mmol/L   Potassium 4.1 3.5 - 5.1 mmol/L   Chloride 106 98 - 111 mmol/L   CO2 26 22 - 32 mmol/L   Glucose, Bld 107 (H) 70 - 99 mg/dL    Comment: Glucose reference range applies only to samples taken after fasting for at least 8 hours.   BUN 18 8 - 23 mg/dL   Creatinine, Ser  1.54 (H) 0.61 - 1.24 mg/dL   Calcium 9.2 8.9 - 16.1 mg/dL   GFR, Estimated 44 (L) >60 mL/min    Comment: (NOTE) Calculated using the CKD-EPI Creatinine Equation (2021)    Anion gap 7 5 - 15    Comment: Performed at [facility], [address], [place], [place] [address]  CBC     Status: Abnormal   Collection Time: 04/29/23  2:44 PM  Result Value Ref Range   WBC 4.8 4.0 - 10.5 K/uL   RBC 4.58 4.22 - 5.81 MIL/uL   Hemoglobin 14.1 13.0 - 17.0 g/dL   HCT 54.0 98.1 - 19.1 %   MCV 93.7 80.0 - 100.0 fL   MCH 30.8 26.0 - 34.0 pg   MCHC 32.9 30.0 - 36.0 g/dL   RDW 47.8 29.5 - 62.1 %   Platelets 140 (L) 150 - 400 K/uL   nRBC 0.0 0.0 - 0.2 %    Comment: Performed at [facility], [address] [place], [place] [address]  Troponin I (High Sensitivity)     Status: None   Collection Time: 04/29/23  2:44 PM  Result Value Ref Range   Troponin I (High Sensitivity) 16 <18 ng/L    Comment: (NOTE) Elevated high sensitivity troponin I (hsTnI) values and significant  changes across serial measurements may suggest ACS but many other  chronic and acute conditions are known to elevate hsTnI results.  Refer to the "Links" section for chest pain algorithms and additional  guidance. Performed at [facility], [address] [place], [place] [address]   Brain natriuretic peptide     Status: Abnormal   Collection Time: 04/29/23  2:44 PM  Result Value Ref Range   B Natriuretic Peptide 399.4 (H) 0.0 - 100.0 pg/mL    Comment: Performed at [facility], [address], [place], [place] [address]  Troponin I (High Sensitivity)     Status: Abnormal   Collection Time: 04/29/23  4:17 PM  Result Value Ref Range   Troponin I (High Sensitivity) 18 (H) <18 ng/L    Comment: (NOTE) Elevated high sensitivity troponin I (hsTnI) values and significant  changes across serial measurements may suggest ACS but many other  chronic and acute conditions are known to elevate hsTnI results.  Refer to the "Links" section for chest pain algorithms and additional  guidance. Performed at [facility], [address] [place], [place] [address]    DG Chest 2 View  Result Date: 04/29/2023 CLINICAL DATA:  Left-sided chest pain and shortness of breath. EXAM: CHEST - 2 VIEW COMPARISON:  Chest radiographs 12/19/2019, 11/09/2019; CT chest 09/15/2020; CT abdomen and pelvis without contrast 07/28/2022 FINDINGS: Left chest wall cardiac AICD with leads overlying the right atrium, right ventricle, and coronary sinus. Cardiac silhouette is again at the upper limits of normal size. Mediastinal contours are within normal limits. There is again a nodular density in the region of a lingula overlying the left cardiac border that appears grossly stable from multiple prior remote radiographs including 11/09/2019. Mild bilateral costophrenic angle densities may reflect chronic scarring seen on 07/28/2022 CT. No definite acute airspace opacity. No pleural effusion pneumothorax. Mild multilevel degenerative disc changes of the thoracic spine. Partially visualized thoracolumbar posterior instrumented fusion hardware. Surgical screw anchors again overlie the left humeral head. IMPRESSION: 1. No acute cardiopulmonary  abnormality. 2. Stable nodular density in the region of the lingula compared to multiple prior radiographs including 11/09/2019.  Electronically Signed   By: Neita Garnet M.D.   On: 04/29/2023 14:58    Pending Labs Unresulted Labs (From admission, onward)     Start     Ordered   04/30/23 0500  Basic metabolic panel  Tomorrow morning,   R        04/29/23 1718   04/30/23 0500  CBC  Tomorrow morning,   R        04/29/23 1718            Vitals/Pain Today's Vitals   04/29/23 1346 04/29/23 1545 04/29/23 1628 04/29/23 1745  BP:  (!) 152/104  (!) 162/95  Pulse:  70  73  Resp:  15  16  Temp:   (!) 97.5 F (36.4 C)   TempSrc:   Oral   SpO2:  99%  98%  Weight: 76.2 kg     Height: 5\' 8"  (1.727 m)     PainSc: 4        Isolation Precautions No active isolations  Medications Medications  nitroGLYCERIN (NITROSTAT) SL tablet 0.4 mg (has no administration in time range)  furosemide (LASIX) tablet 20 mg (20 mg Oral Given 04/29/23 1733)  sacubitril-valsartan (ENTRESTO) 24-26 mg per tablet (has no administration in time range)  isosorbide mononitrate (IMDUR) 24 hr tablet 30 mg (has no administration in time range)  metoprolol succinate (TOPROL-XL) 24 hr tablet 100 mg (has no administration in time range)  apixaban (ELIQUIS) tablet 2.5 mg (has no administration in time range)  fluticasone furoate-vilanterol (BREO ELLIPTA) 100-25 MCG/ACT 1 puff (has no administration in time range)  dorzolamide-timolol (COSOPT) 2-0.5 % ophthalmic solution 1 drop (has no administration in time range)  latanoprost (XALATAN) 0.005 % ophthalmic solution 1 drop (has no administration in time range)  acetaminophen (TYLENOL) tablet 650 mg (has no administration in time range)    Or  acetaminophen (TYLENOL) suppository 650 mg (has no administration in time range)  traZODone (DESYREL) tablet 25 mg (has no administration in time range)  senna-docusate (Senokot-S) tablet 1 tablet (has no administration in time range)   ondansetron (ZOFRAN) tablet 4 mg (has no administration in time range)    Or  ondansetron (ZOFRAN) injection 4 mg (has no administration in time range)  albuterol (PROVENTIL) (2.5 MG/3ML) 0.083% nebulizer solution 2.5 mg (has no administration in time range)  metoprolol tartrate (LOPRESSOR) injection 5 mg (has no administration in time range)    Mobility walks     Focused Assessments     R Recommendations: See Admitting Provider Note  Report given to:   Additional Notes:

## 2023-04-30 ENCOUNTER — Observation Stay (HOSPITAL_BASED_OUTPATIENT_CLINIC_OR_DEPARTMENT_OTHER): Payer: 59

## 2023-04-30 DIAGNOSIS — M940 Chondrocostal junction syndrome [Tietze]: Secondary | ICD-10-CM

## 2023-04-30 DIAGNOSIS — I5022 Chronic systolic (congestive) heart failure: Secondary | ICD-10-CM | POA: Diagnosis not present

## 2023-04-30 DIAGNOSIS — I5021 Acute systolic (congestive) heart failure: Secondary | ICD-10-CM | POA: Diagnosis not present

## 2023-04-30 LAB — BASIC METABOLIC PANEL
Anion gap: 10 (ref 5–15)
BUN: 18 mg/dL (ref 8–23)
CO2: 23 mmol/L (ref 22–32)
Calcium: 9.1 mg/dL (ref 8.9–10.3)
Chloride: 104 mmol/L (ref 98–111)
Creatinine, Ser: 1.41 mg/dL — ABNORMAL HIGH (ref 0.61–1.24)
GFR, Estimated: 49 mL/min — ABNORMAL LOW (ref >60)
Glucose, Bld: 105 mg/dL — ABNORMAL HIGH (ref 70–99)
Potassium: 3.6 mmol/L (ref 3.5–5.1)
Sodium: 137 mmol/L (ref 135–145)

## 2023-04-30 LAB — ECHOCARDIOGRAM COMPLETE
Area-P 1/2: 4.36 cm2
Calc EF: 35.2 %
Height: 68 in
S' Lateral: 4.3 cm
Single Plane A2C EF: 38.2 %
Single Plane A4C EF: 31.8 %
Weight: 2688 oz

## 2023-04-30 LAB — CBC
HCT: 42.3 % (ref 39.0–52.0)
Hemoglobin: 14.1 g/dL (ref 13.0–17.0)
MCH: 30.6 pg (ref 26.0–34.0)
MCHC: 33.3 g/dL (ref 30.0–36.0)
MCV: 91.8 fL (ref 80.0–100.0)
Platelets: 140 10*3/uL — ABNORMAL LOW (ref 150–400)
RBC: 4.61 MIL/uL (ref 4.22–5.81)
RDW: 12.2 % (ref 11.5–15.5)
WBC: 4.3 10*3/uL (ref 4.0–10.5)
nRBC: 0 % (ref 0.0–0.2)

## 2023-04-30 MED ORDER — PREDNISONE 20 MG PO TABS
20.0000 mg | ORAL_TABLET | Freq: Every day | ORAL | Status: DC
  Administered 2023-04-30: 20 mg via ORAL
  Filled 2023-04-30: qty 1

## 2023-04-30 MED ORDER — SPIRONOLACTONE 12.5 MG HALF TABLET
12.5000 mg | ORAL_TABLET | Freq: Every day | ORAL | Status: DC
  Administered 2023-04-30: 12.5 mg via ORAL
  Filled 2023-04-30: qty 1

## 2023-04-30 MED ORDER — PREDNISONE 20 MG PO TABS: 20.0000 mg | ORAL_TABLET | Freq: Every day | ORAL | 0 refills | Status: AC

## 2023-04-30 NOTE — Progress Notes (Signed)
Pt d/c'd before TOC assessment could be completed.

## 2023-04-30 NOTE — Consult Note (Signed)
Cardiology Consultation:  Patient ID: [name] MRN: [id number]; DOB: [birth year]  Admit date: 04/29/2023 Date of Consult: 04/30/2023  Primary Care Provider: [name], MD Primary Cardiologist: Nicki Guadalajara, MD  Primary Electrophysiologist:  Thurmon Fair, MD   Patient Profile:  [name] is a 82 y.o. male with a hx of dilated cardiomyopathy, ventricular tachycardia status post ablation/CRT-D, sick sinus syndrome status post pacemaker implant, CKD stage IIIa, CAD (80% diagonal lesion 2020), paroxysmal atrial fibrillation who is being seen today for the evaluation of chest pain at the request of Rodney Edis, MD.  History of Present Illness:  Mr. Sonsalla presents with 1 day of chest pain.  He describes left-sided chest discomfort that began the day prior to admission.  Reports it is worse with deep inspiration and palpation of the left chest.  This actually resolved.  He tells me it is now in his right chest.  He is exquisitely tender to palpation of the right chest wall.  He reports no worsening volume overload.  No fevers or chills.  Pain does not appear to be positional.  It is intermittent.  It clearly is worse with palpation.  Laboratory data shows stable kidney function with a serum creatinine of 1.41.  His BNP is 399.  Chest x-ray lung exam is clear.  Troponins are 16 and 18 on repeat.  Blood counts are stable.  EKG shows paced rhythm heart rate 71.  Heart Pathway Score:       Past Medical History: Past Medical History:  Diagnosis Date   AICD (automatic cardioverter/defibrillator) present    CAD (coronary artery disease) 80% stenosis diag of the LAD, 30% in OM2 branch of LCX in 2009    a. Nonobstructive CAD by cath 11/2011 with the exception of the pre-existing diagonal branch #2 lesion.   Chronic systolic CHF (congestive heart failure) (HCC)    CKD (chronic kidney disease) stage 3, GFR 30-59 ml/min (HCC)    Colon polyp, hyperplastic    History of stress test  06/01/2012   Normal myocardial perfusion study. compared to the previous study there is no significant change. this is a low risk scan   Hypertension    Legally blind    "both eyes"   Myocardial infarction [facility]) 11/22/2011   NICM (nonischemic cardiomyopathy) (HCC)    a. Remote hx of dilated NICM with EF ranging 20-45%, including normal EF by echo (55-60%) in 2014.   Peripheral arterial disease (HCC)    a. 06/2014: ABI right 0.99, left 1.2, LE dopplers revealing an occluded right posterior tibial. As symptoms were not felt r/t claudication, no further w/u at the time.   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 Requiring discontinuation of BB dose.   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    Past Surgical History: Past Surgical History:  Procedure Laterality Date   BACK SURGERY     BIV UPGRADE N/A 11/08/2019   Procedure: BIV UPGRADE;  Surgeon: Marinus Maw, MD;  Location: [facility] INVASIVE CV LAB;  Service: Cardiovascular;  Laterality: N/A;   CARDIAC CATHETERIZATION  11/2011   CARDIAC CATHETERIZATION  11/2011   didn't demonstrate high grade obstructive disease to account for his LV dysfunction.   CATARACT EXTRACTION, BILATERAL  1990's   CYSTOSCOPY     CYSTOSCOPY WITH BIOPSY Bilateral 08/03/2021   Procedure: CYSTOSCOPY WITH BLADDER BIOPSY, BILATERAL RETROGRADE PYELOGRAM;  Surgeon: Crista Elliot, MD;  Location: [facility] ORS;  Service: Urology;  Laterality: Bilateral;  REQUESTING 45 MINS   CYSTOSCOPY WITH URETHRAL DILATATION N/A 05/04/2013   Procedure: CYSTOSCOPY WITH URETHRAL DILATATION;  Surgeon: Tia Alert, MD;  Location: [facility] NEURO ORS;  Service: Neurosurgery;  Laterality: N/A;  with insertion of foley catheter   EP study and ablation of VT  7/13   PVC focus mapped to the right coronary cusp of the aorta, limited ablation performed due to proximity of the focus to  the right coronary artery   EYE SURGERY     LEFT HEART CATH AND CORONARY ANGIOGRAPHY N/A 11/07/2019   Procedure: LEFT HEART CATH AND CORONARY ANGIOGRAPHY;  Surgeon: Iran Ouch, MD;  Location: [facility] INVASIVE CV LAB;  Service: Cardiovascular;  Laterality: N/A;   LEFT HEART CATHETERIZATION WITH CORONARY ANGIOGRAM N/A 11/25/2011   Procedure: LEFT HEART CATHETERIZATION WITH CORONARY ANGIOGRAM;  Surgeon: Marykay Lex, MD;  Location: [facility] CATH LAB;  Service: Cardiovascular;  Laterality: N/A;   PACEMAKER IMPLANT N/A 07/12/2018   Procedure: PACEMAKER IMPLANT;  Surgeon: Thurmon Fair, MD;  Location: [facility] INV[facility] CV LAB;  Service: Cardiovascular;  Laterality: N/A;   POSTERIOR FUSION LUMBAR SPINE  1979   ROTATOR CUFF REPAIR  2000's   left   V-TACH ABLATION N/A 06/06/2012   Procedure: V-TACH ABLATION;  Surgeon: Hillis Range, MD;  Location: [facility] CATH LAB;  Service: Cardiovascular;  Laterality: N/A;     Home Medications:  Prior to Admission medications   Medication Sig Start Date End Date Taking? Authorizing Provider  apixaban (ELIQUIS) 2.5 MG TABS tablet Take 1 tablet (2.5 mg total) by mouth 2 (two) times daily. 11/24/22  Yes Croitoru, Mihai, MD  BREO ELLIPTA 100-25 MCG/INH AEPB Inhale 1 puff into the lungs every morning. 05/07/20  Yes [provider]  brimonidine (ALPHAGAN) 0.2 % ophthalmic solution Place 1 drop into both eyes 2 (two) times daily. 03/29/23  Yes [provider]  cycloSPORINE (RESTASIS) 0.05 % ophthalmic emulsion Place 1 drop into both eyes 2 (two) times daily.   Yes [provider]  dorzolamide-timolol (COSOPT) 22.3-6.8 MG/ML ophthalmic solution Place 1 drop into both eyes 2 (two) times daily. 03/14/20  Yes [provider]  fexofenadine (ALLEGRA) 180 MG tablet Take 180 mg by mouth daily as needed for allergies or rhinitis.   Yes [provider]  furosemide (LASIX) 20 MG tablet TAKE 1 TABLET(20 MG) BY MOUTH DAILY Patient taking differently: Take 20 mg  by mouth See admin instructions. Take 20 mg by mouth EVERY OTHER MORNING 04/25/23  Yes Croitoru, Mihai, MD  isosorbide mononitrate (IMDUR) 30 MG 24 hr tablet Take 30 mg by mouth daily. 02/24/20  Yes [provider]  metoprolol succinate (TOPROL-XL) 100 MG 24 hr tablet TAKE 1 TABLET(100 MG) BY MOUTH DAILY WITH OR IMMEDIATELY FOLLOWING A MEAL Patient taking differently: Take 100 mg by mouth in the morning. 10/19/22  Yes Lennette Bihari, MD  nitroGLYCERIN (NITROSTAT) 0.4 MG SL tablet Place 1 tablet (0.4 mg total) under the tongue every 5 (five) minutes as needed for chest pain. 11/11/22  Yes Croitoru, Mihai, MD  ROCKLATAN 0.02-0.005 % SOLN Apply 1 drop to eye at bedtime.   Yes [provider]  sacubitril-valsartan (ENTRESTO) 24-26 MG Take 1 tablet by mouth 2 (two) times daily. 10/04/22  Yes Croitoru, Mihai, MD  spironolactone (ALDACTONE) 25 MG tablet Take 12.5 mg by mouth daily. 08/31/22  Yes [provider]  albuterol (VENTOLIN HFA) 108 (  90 Base) MCG/ACT inhaler Inhale 1-2 puffs into the lungs every 6 (six) hours as needed for wheezing or shortness of breath.    [provider]    Inpatient Medications: Scheduled Meds:  apixaban  2.5 mg Oral BID   dorzolamide-timolol  1 drop Both Eyes BID   fluticasone furoate-vilanterol  1 puff Inhalation Daily   furosemide  20 mg Oral Daily   isosorbide mononitrate  30 mg Oral Daily   latanoprost  1 drop Both Eyes QHS   metoprolol succinate  100 mg Oral Daily   nitroGLYCERIN  0.4 mg Sublingual NOW   predniSONE  20 mg Oral Q breakfast   sacubitril-valsartan  1 tablet Oral BID   Continuous Infusions:  PRN Meds: acetaminophen **OR** acetaminophen, albuterol, metoprolol tartrate, ondansetron **OR** ondansetron (ZOFRAN) IV, senna-docusate, traZODone  Allergies:    No Known Allergies  Social History:   Social History   Socioeconomic History   Marital status: Legally Separated    Spouse name: Not on file   Number of  children: 3   Years of education: Not on file   Highest education level: Not on file  Occupational History   Occupation: Retired    Associate Professor: RETIRED  Tobacco Use   Smoking status: Former    Years: 50    Types: Cigarettes    Quit date: 11/23/1999    Years since quitting: 23.4   Smokeless tobacco: Never  Vaping Use   Vaping Use: Never used  Substance and Sexual Activity   Alcohol use: No    Comment: 05/26/12 "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; stopped drinking in the early 1980's"   Drug use: No   Sexual activity: Yes  Other Topics Concern   Not on file  Social History Narrative   Not on file   Social Determinants of Health   Financial Resource Strain: Not on file  Food Insecurity: Unknown (04/30/2023)   Hunger Vital Sign    Worried About Running Out of Food in the Last Year: Patient declined    Ran Out of Food in the Last Year: Never true  Transportation Needs: No Transportation Needs (04/30/2023)   PRAPARE - Administrator, Civil Service (Medical): No    Lack of Transportation (Non-Medical): No  Physical Activity: Not on file  Stress: Not on file  Social Connections: Not on file  Intimate Partner Violence: Unknown (04/30/2023)   Humiliation, Afraid, Rape, and Kick questionnaire    Fear of Current or Ex-Partner: No    Emotionally Abused: No    Physically Abused: Not on file    Sexually Abused: No     Family History:    Family History  Problem Relation Age of Onset   Asthma Mother    Other Other        Unsure if any heart disease in his family.     ROS:  All other ROS reviewed and negative. Pertinent positives noted in the HPI.     Physical Exam/Data:   Vitals:   04/29/23 1915 04/29/23 2051 04/30/23 0040 04/30/23 0511  BP: (!) 172/103 (!) 167/107 (!) 146/92 (!) 153/89  Pulse: 74 71 73 70  Resp: 17 20 18 16   Temp:  97.6 F (36.4 C) 97.9 F (36.6 C) 97.9 F (36.6 C)  TempSrc:  Oral Oral Oral  SpO2: 98% 98% 97% 98%  Weight:      Height:        Intake/Output  Summary (Last 24 hours) at 04/30/2023 0813 Last data  filed at 04/29/2023 1733 Gross per 24 hour  Intake 350 ml  Output --  Net 350 ml       04/29/2023    1:46 PM 04/12/2023    1:31 PM 01/17/2023    9:13 AM  Last 3 Weights  Weight (lbs) 168 lb 171 lb 3.2 oz 172 lb 9.6 oz  Weight (kg) 76.204 kg 77.656 kg 78.291 kg    Body mass index is 25.54 kg/m.  General: Well nourished, well developed, in no acute distress Head: Atraumatic, normal size  Eyes: PEERLA, EOMI  Neck: Supple, no JVD Endocrine: No thryomegaly Cardiac: Normal S1, S2; RRR; no murmurs, rubs, or gallops Lungs: Clear to auscultation bilaterally, no wheezing, rhonchi or rales  Abd: Soft, nontender, no hepatomegaly  Ext: No edema, pulses 2+ Musculoskeletal: No deformities, BUE and BLE strength normal and equal Skin: Warm and dry, no rashes   Neuro: Alert and oriented to person, place, time, and situation, CNII-XII grossly intact, no focal deficits  Psych: Normal mood and affect   EKG:  The EKG was personally reviewed and demonstrates: V paced rhythm heart rate 71 bpm, likely a pacing as well Telemetry:  Telemetry was personally reviewed and demonstrates:  V paced 74 bpm  Relevant CV Studies: TTE 01/26/2022  1. Left ventricular ejection fraction, by estimation, is 35 to 40%. The  left ventricle has moderately decreased function. The left ventricle  demonstrates regional wall motion abnormalities (see scoring  diagram/findings for description). The left  ventricular internal cavity size was moderately dilated. Left ventricular  diastolic parameters are consistent with Grade II diastolic dysfunction  (pseudonormalization). There is severe akinesis of the left ventricular,  basal-mid inferior wall and  inferolateral wall.   2. Right ventricular systolic function is normal. The right ventricular  size is normal. There is normal pulmonary artery systolic pressure.   3. Left atrial size was severely dilated.   4. The mitral valve  is normal in structure. Mild mitral valve  regurgitation.   5. Tricuspid valve regurgitation is moderate.   6. The aortic valve is tricuspid. Aortic valve regurgitation is not  visualized. No aortic stenosis is present.   LHC 11/07/2019  Prox LAD to Mid LAD lesion is 30% stenosed. 1st Diag lesion is 80% stenosed.   1.  Stable 80% stenosis in diagonal branch which was described on previous cardiac catheterization in 2009.  No other obstructive disease.   2.  Normal left ventricular end-diastolic pressure.  Left ventricular angiography was not performed due to chronic kidney disease.  Laboratory Data: High Sensitivity Troponin:   Recent Labs  Lab 04/29/23 1444 04/29/23 1617  TROPONINIHS 16 18*     Cardiac EnzymesNo results for input(s): "TROPONINI" in the last 168 hours. No results for input(s): "TROPIPOC" in the last 168 hours.  Chemistry Recent Labs  Lab 04/29/23 1444 04/30/23 0538  NA 139 137  K 4.1 3.6  CL 106 104  CO2 26 23  GLUCOSE 107* 105*  BUN 18 18  CREATININE 1.54* 1.41*  CALCIUM 9.2 9.1  GFRNONAA 44* 49*  ANIONGAP 7 10    No results for input(s): "PROT", "ALBUMIN", "AST", "ALT", "ALKPHOS", "BILITOT" in the last 168 hours. Hematology Recent Labs  Lab 04/29/23 1444 04/30/23 0538  WBC 4.8 4.3  RBC 4.58 4.61  HGB 14.1 14.1  HCT 42.9 42.3  MCV 93.7 91.8  MCH 30.8 30.6  MCHC 32.9 33.3  RDW 12.3 12.2  PLT 140* 140*   BNP Recent Labs  Lab 04/29/23  1444  BNP 399.4*    DDimer No results for input(s): "DDIMER" in the last 168 hours.  Radiology/Studies:  DG Chest 2 View  Result Date: 04/29/2023 CLINICAL DATA:  Left-sided chest pain and shortness of breath. EXAM: CHEST - 2 VIEW COMPARISON:  Chest radiographs 12/19/2019, 11/09/2019; CT chest 09/15/2020; CT abdomen and pelvis without contrast 07/28/2022 FINDINGS: Left chest wall cardiac AICD with leads overlying the right atrium, right ventricle, and coronary sinus. Cardiac silhouette is again at the upper  limits of normal size. Mediastinal contours are within normal limits. There is again a nodular density in the region of a lingula overlying the left cardiac border that appears grossly stable from multiple prior remote radiographs including 11/09/2019. Mild bilateral costophrenic angle densities may reflect chronic scarring seen on 07/28/2022 CT. No definite acute airspace opacity. No pleural effusion pneumothorax. Mild multilevel degenerative disc changes of the thoracic spine. Partially visualized thoracolumbar posterior instrumented fusion hardware. Surgical screw anchors again overlie the left humeral head. IMPRESSION: 1. No acute cardiopulmonary abnormality. 2. Stable nodular density in the region of the lingula compared to multiple prior radiographs including 11/09/2019. Electronically Signed   By: Neita Garnet M.D.   On: 04/29/2023 14:58    Assessment and Plan:   # Chest pain, noncardiac, likely costochondritis -Exquisitely tender to palpation of the right chest wall.  Symptoms started in the left chest.  He is tender there as well.  No fevers or chills.  Not positional.  Do not believe this is pericarditis.  Troponins are flat.  He is not volume overloaded.  Suspect this is costochondritis.  As long as his echo is unchanged he can be discharged.  I have ordered a diet.  We will start him on prednisone 20 mg daily for 5 days see if we can improve his symptoms.  We will have him follow-up in 1 to 2 weeks in our office.  # Chronic systolic heart failure, EF 35-40% -Largely nonischemic based on angiogram from 2020.  Euvolemic on exam.  Lungs are clear.  Chest x-ray clear. -Would recommend just to continue home medications which include Imdur 30 mg daily, metoprolol succinate 100 mg daily, Entresto 24-26 mg twice daily.  He can go back on his Aldactone 12.5 mg daily. -Not on SGLT2 inhibitor per review of records cost has been an issue with primary cardiologist. -Resume Lasix 20 mg daily. -No issues  with device.  He is biventricular paced 95% of the time.  # CAD -80% diagonal lesion in 2020 that was managed medically.  I do not believe this has any relation to his current clinical condition. -Continue antianginal therapy which includes beta-blocker and Imdur.  # CKD stage IIIa -Stable  # Paroxysmal A-fib -No recurrence.  On beta-blocker and Eliquis.  [facility] will sign off.   Medication Recommendations: As above Other recommendations (labs, testing, etc): None Follow up as an outpatient: We will arrange outpatient follow-up in 1 to 2 weeks  For questions or updates, please contact [facility] Please consult [email or web address] for contact info under   Signed, Gerri Spore T. Flora Lipps, MD, [facility] [place]  [facility]  04/30/2023 8:13 AM

## 2023-04-30 NOTE — Discharge Summary (Signed)
Physician Discharge Summary   [name]:[id number] DOB: 1941/05/01 DOA: 04/29/2023  PCP: [name], MD  Admit date: 04/29/2023 Discharge date: 04/30/2023  Admitted From: Home Disposition:  Home Discharging physician: Lewie Chamber, MD Barriers to discharge: none  Recommendations at discharge: Follow up with cardiology   Discharge Condition: stable CODE STATUS: Full Diet recommendation:  Diet Orders (From admission, onward)     Start     Ordered   04/30/23 0819  Diet Heart Room service appropriate? Yes; Fluid consistency: Thin  Diet effective now       Question Answer Comment  Room service appropriate? Yes   Fluid consistency: Thin      04/30/23 0819   04/30/23 0000  Diet - low sodium heart healthy        04/30/23 1342            Hospital Course:  [name] is an 82 y.o. 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 He has been compliant on medications and had endorsed that his chest pain developed after he had walked up 3 flights of stairs for a doctor appointment with his wife earlier in the week.  He had felt sore ever since and had been taking sublingual nitroglycerin with no relief.  On examination, his chest pains were easily reproducible notably over rib muscles. He was admitted for cardiac workup to rule out cardiac etiology. Cardiology was also consulted.     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 Baseline creat ~ 1.4 - 1.5, eGFR~ 42 - at baseline   The patient's chronic medical conditions were treated accordingly per the patient's home medication regimen except as noted.  On day of discharge, patient was felt deemed stable for discharge. Patient/family member advised to call PCP or come back to ER if needed.   Principal Diagnosis: Costochondritis  Discharge Diagnoses: Active Hospital Problems   Diagnosis Date Noted   Costochondritis 04/29/2023    Priority: 1.    Resolved Hospital Problems  No resolved problems to display.     Discharge Instructions     Diet - low sodium heart healthy   Complete by: As directed    Increase activity slowly   Complete by: As directed       Allergies as of 04/30/2023   No Known Allergies      Medication List     TAKE these medications    albuterol 108 (90 Base) MCG/ACT inhaler Commonly known as: VENTOLIN HFA Inhale 1-2 puffs into the lungs every 6 (six) hours as needed for wheezing or shortness of breath.   apixaban 2.5 MG Tabs tablet Commonly known as: ELIQUIS Take 1 tablet (2.5 mg total) by mouth 2 (two) times daily.   Breo Ellipta 100-25 MCG/ACT Aepb Generic  drug: fluticasone furoate-vilanterol Inhale 1 puff into the lungs every morning.   brimonidine 0.2 % ophthalmic solution Commonly known as: ALPHAGAN Place 1 drop into both eyes 2 (two) times daily.   cycloSPORINE 0.05 % ophthalmic emulsion Commonly known as: RESTASIS Place 1 drop into both eyes 2 (two) times daily.   dorzolamide-timolol 2-0.5 % ophthalmic solution Commonly known as: COSOPT Place 1 drop into both eyes 2 (two) times daily.   fexofenadine 180 MG tablet Commonly known as: ALLEGRA Take 180 mg by mouth daily as  needed for allergies or rhinitis.   furosemide 20 MG tablet Commonly known as: LASIX TAKE 1 TABLET(20 MG) BY MOUTH DAILY What changed: See the new instructions.   isosorbide mononitrate 30 MG 24 hr tablet Commonly known as: IMDUR Take 30 mg by mouth daily.   metoprolol succinate 100 MG 24 hr tablet Commonly known as: TOPROL-XL TAKE 1 TABLET(100 MG) BY MOUTH DAILY WITH OR IMMEDIATELY FOLLOWING A MEAL What changed: See the new instructions.   nitroGLYCERIN 0.4 MG SL tablet Commonly known as: NITROSTAT Place 1 tablet (0.4 mg total) under the tongue every 5 (five) minutes as needed for chest pain.   predniSONE 20 MG tablet Commonly known as: DELTASONE Take 1 tablet (20 mg total) by mouth daily with breakfast for 4 days. Start taking on: May 01, 2023   Rocklatan 0.02-0.005 % Soln Generic drug: Netarsudil-Latanoprost Apply 1 drop to eye at bedtime.   sacubitril-valsartan 24-26 MG Commonly known as: ENTRESTO Take 1 tablet by mouth 2 (two) times daily.   spironolactone 25 MG tablet Commonly known as: ALDACTONE Take 12.5 mg by mouth daily.        Follow-up Information     Carlos Levering, NP Follow up on 05/06/2023.   Specialty: Cardiology Why: @ 2:45PM. Cardiology follow up Contact information: [address] [place] [address] [phone]                No Known Allergies  Consultations: Cardiology  Procedures:   Discharge Exam: BP (!) 146/87 (BP Location: Right Arm)   Pulse 77   Temp 98 F (36.7 C) (Oral)   Resp 17   Ht 5\' 8"  (1.727 m)   Wt 76.2 kg   SpO2 98%   BMI 25.54 kg/m  Physical Exam Constitutional:      General: He is not in acute distress.    Appearance: Normal appearance.  HENT:     Head: Normocephalic and atraumatic.     Mouth/Throat:     Mouth: Mucous membranes are moist.  Eyes:     Extraocular Movements: Extraocular movements intact.  Cardiovascular:     Rate and Rhythm: Normal rate and regular rhythm.   Pulmonary:     Effort: Pulmonary effort is normal. No respiratory distress.     Breath sounds: Normal breath sounds. No wheezing.  Chest:     Chest wall: Tenderness (TTP over R>L anterior ribs) present.  Abdominal:     General: Bowel sounds are normal. There is no distension.     Palpations: Abdomen is soft.     Tenderness: There is no abdominal tenderness.  Musculoskeletal:        General: Normal range of motion.     Cervical back: Normal range of motion and neck supple.  Skin:    General: Skin is warm and dry.  Neurological:     General: No focal deficit present.     Mental Status: He is alert.  Psychiatric:  Mood and Affect: Mood normal.        Behavior: Behavior normal.      The results of significant diagnostics from this hospitalization (including imaging, microbiology, ancillary and laboratory) are listed below for reference.   Microbiology: No results found for this or any previous visit (from the past 240 hour(s)).   Labs: BNP (last 3 results) Recent Labs    04/29/23 1444  BNP 399.4*   Basic Metabolic Panel: Recent Labs  Lab 04/29/23 1444 04/30/23 0538  NA 139 137  K 4.1 3.6  CL 106 104  CO2 26 23  GLUCOSE 107* 105*  BUN 18 18  CREATININE 1.54* 1.41*  CALCIUM 9.2 9.1   Liver Function Tests: No results for input(s): "AST", "ALT", "ALKPHOS", "BILITOT", "PROT", "ALBUMIN" in the last 168 hours. No results for input(s): "LIPASE", "AMYLASE" in the last 168 hours. No results for input(s): "AMMONIA" in the last 168 hours. CBC: Recent Labs  Lab 04/29/23 1444 04/30/23 0538  WBC 4.8 4.3  HGB 14.1 14.1  HCT 42.9 42.3  MCV 93.7 91.8  PLT 140* 140*   Cardiac Enzymes: No results for input(s): "CKTOTAL", "CKMB", "CKMBINDEX", "TROPONINI" in the last 168 hours. BNP: Invalid input(s): "POCBNP" CBG: No results for input(s): "GLUCAP" in the last 168 hours. D-Dimer No results for input(s): "DDIMER" in the last 72 hours. Hgb A1c No results for  input(s): "HGBA1C" in the last 72 hours. Lipid Profile No results for input(s): "CHOL", "HDL", "LDLCALC", "TRIG", "CHOLHDL", "LDLDIRECT" in the last 72 hours. Thyroid function studies No results for input(s): "TSH", "T4TOTAL", "T3FREE", "THYROIDAB" in the last 72 hours.  Invalid input(s): "FREET3" Anemia work up No results for input(s): "VITAMINB12", "FOLATE", "FERRITIN", "TIBC", "IRON", "RETICCTPCT" in the last 72 hours. Urinalysis    Component Value Date/Time   COLORURINE YELLOW 04/29/2023 1436   APPEARANCEUR CLEAR 04/29/2023 1436   LABSPEC 1.014 04/29/2023 1436   PHURINE 7.0 04/29/2023 1436   GLUCOSEU NEGATIVE 04/29/2023 1436   HGBUR SMALL (A) 04/29/2023 1436   BILIRUBINUR NEGATIVE 04/29/2023 1436   KETONESUR NEGATIVE 04/29/2023 1436   PROTEINUR NEGATIVE 04/29/2023 1436   UROBILINOGEN 0.2 11/22/2011 0247   NITRITE POSITIVE (A) 04/29/2023 1436   LEUKOCYTESUR SMALL (A) 04/29/2023 1436   Sepsis Labs Recent Labs  Lab 04/29/23 1444 04/30/23 0538  WBC 4.8 4.3   Microbiology No results found for this or any previous visit (from the past 240 hour(s)).  Procedures/Studies: ECHOCARDIOGRAM COMPLETE  Result Date: 04/30/2023    ECHOCARDIOGRAM REPORT   Patient Name:   [name] Date of Exam: 04/30/2023 Medical Rec #:  [id number]        Height:       68.0 in Accession #:    [id number]       Weight:       168.0 lb Date of Birth:  1941-03-07         BSA:          1.898 m Patient Age:    82 years         BP:           153/89 mmHg Patient Gender: M                HR:           72 bpm. Exam Location:  Inpatient Procedure: 2D Echo, Color Doppler and Cardiac Doppler Indications:    I50.21 Acute systolic (congestive) heart failure  History:        Patient has prior  history of Echocardiogram examinations, most                 recent 01/26/2022. CHF, CAD, Pacemaker and Defibrillator, PAD and                 COPD, Arrythmias:Atrial Fibrillation; Risk Factors:Hypertension                 and  Dyslipidemia.  Sonographer:    Irving Burton Senior RDCS Referring Phys: [id number] MIR M [facility] IMPRESSIONS  1. Left ventricular ejection fraction, by estimation, is 35 to 40%. The left ventricle has moderately decreased function. The left ventricle demonstrates regional wall motion abnormalities (see scoring diagram/findings for description). Left ventricular  diastolic parameters are consistent with Grade I diastolic dysfunction (impaired relaxation).  2. Right ventricular systolic function is normal. The right ventricular size is normal. There is normal pulmonary artery systolic pressure. The estimated right ventricular systolic pressure is 21.0 mmHg.  3. Left atrial size was mild to moderately dilated.  4. The mitral valve is grossly normal. Trivial mitral valve regurgitation.  5. The aortic valve is tricuspid. There is mild calcification of the aortic valve. Aortic valve regurgitation is not visualized. No aortic stenosis is present.  6. The inferior vena cava is normal in size with greater than 50% respiratory variability, suggesting right atrial pressure of 3 mmHg. Comparison(s): Prior images reviewed side by side. LVEF stable in 35-40% range. FINDINGS  Left Ventricle: Left ventricular ejection fraction, by estimation, is 35 to 40%. The left ventricle has moderately decreased function. The left ventricle demonstrates regional wall motion abnormalities. The left ventricular internal cavity size was normal in size. There is no left ventricular hypertrophy. Left ventricular diastolic parameters are consistent with Grade I diastolic dysfunction (impaired relaxation).  LV Wall Scoring: The antero-lateral wall, posterior wall, and basal inferior segment are akinetic. The entire anterior wall, entire septum, entire apex, and mid and distal inferior wall are normal. Right Ventricle: The right ventricular size is normal. No increase in right ventricular wall thickness. Right ventricular systolic function is normal. There is  normal pulmonary artery systolic pressure. The tricuspid regurgitant velocity is 2.12 m/s, and  with an assumed right atrial pressure of 3 mmHg, the estimated right ventricular systolic pressure is 21.0 mmHg. Left Atrium: Left atrial size was mild to moderately dilated. Right Atrium: Right atrial size was normal in size. Pericardium: There is no evidence of pericardial effusion. Mitral Valve: The mitral valve is grossly normal. Trivial mitral valve regurgitation. Tricuspid Valve: The tricuspid valve is grossly normal. Tricuspid valve regurgitation is mild. Aortic Valve: The aortic valve is tricuspid. There is mild calcification of the aortic valve. There is mild aortic valve annular calcification. Aortic valve regurgitation is not visualized. No aortic stenosis is present. Pulmonic Valve: The pulmonic valve was grossly normal. Pulmonic valve regurgitation is trivial. Aorta: The aortic root is normal in size and structure. Venous: The inferior vena cava is normal in size with greater than 50% respiratory variability, suggesting right atrial pressure of 3 mmHg. IAS/Shunts: No atrial level shunt detected by color flow Doppler. Additional Comments: A device lead is visualized in the right atrium and right ventricle.  LEFT VENTRICLE PLAX 2D LVIDd:         5.20 cm      Diastology LVIDs:         4.30 cm      LV e' medial:    3.81 cm/s LV PW:         0.80  cm      LV E/e' medial:  14.2 LV IVS:        0.90 cm      LV e' lateral:   4.79 cm/s LVOT diam:     2.20 cm      LV E/e' lateral: 11.3 LV SV:         46 LV SV Index:   24 LVOT Area:     3.80 cm  LV Volumes (MOD) LV vol d, MOD A2C: 100.1 ml LV vol d, MOD A4C: 107.5 ml LV vol s, MOD A2C: 61.9 ml LV vol s, MOD A4C: 73.4 ml LV SV MOD A2C:     38.3 ml LV SV MOD A4C:     107.5 ml LV SV MOD BP:      37.6 ml RIGHT VENTRICLE RV S prime:     10.00 cm/s TAPSE (M-mode): 2.2 cm LEFT ATRIUM             Index        RIGHT ATRIUM           Index LA diam:        3.90 cm 2.06 cm/m   RA  Area:     20.20 cm LA Vol (A2C):   98.7 ml 52.01 ml/m  RA Volume:   60.00 ml  31.62 ml/m LA Vol (A4C):   64.0 ml 33.72 ml/m LA Biplane Vol: 79.8 ml 42.05 ml/m  AORTIC VALVE LVOT Vmax:   67.60 cm/s LVOT Vmean:  50.100 cm/s LVOT VTI:    0.121 m  AORTA Ao Root diam: 2.80 cm Ao Asc diam:  3.20 cm MITRAL VALVE               TRICUSPID VALVE MV Area (PHT): 4.36 cm    TR Peak grad:   18.0 mmHg MV Decel Time: 174 msec    TR Vmax:        212.00 cm/s MV E velocity: 54.00 cm/s MV A velocity: 67.70 cm/s  SHUNTS MV E/A ratio:  0.80        Systemic VTI:  0.12 m                            Systemic Diam: 2.20 cm Nona Dell MD Electronically signed by Nona Dell MD Signature Date/Time: 04/30/2023/1:13:25 PM    Final    DG Chest 2 View  Result Date: 04/29/2023 CLINICAL DATA:  Left-sided chest pain and shortness of breath. EXAM: CHEST - 2 VIEW COMPARISON:  Chest radiographs 12/19/2019, 11/09/2019; CT chest 09/15/2020; CT abdomen and pelvis without contrast 07/28/2022 FINDINGS: Left chest wall cardiac AICD with leads overlying the right atrium, right ventricle, and coronary sinus. Cardiac silhouette is again at the upper limits of normal size. Mediastinal contours are within normal limits. There is again a nodular density in the region of a lingula overlying the left cardiac border that appears grossly stable from multiple prior remote radiographs including 11/09/2019. Mild bilateral costophrenic angle densities may reflect chronic scarring seen on 07/28/2022 CT. No definite acute airspace opacity. No pleural effusion pneumothorax. Mild multilevel degenerative disc changes of the thoracic spine. Partially visualized thoracolumbar posterior instrumented fusion hardware. Surgical screw anchors again overlie the left humeral head. IMPRESSION: 1. No acute cardiopulmonary abnormality. 2. Stable nodular density in the region of the lingula compared to multiple prior radiographs including 11/09/2019. Electronically Signed    By: Neita Garnet M.D.   On: 04/29/2023 14:58  Time coordinating discharge: Over 30 minutes    Lewie Chamber, MD  [facility] 04/30/2023, 2:13 PM

## 2023-05-06 ENCOUNTER — Ambulatory Visit: Payer: 59 | Attending: Student | Admitting: Student

## 2023-05-06 ENCOUNTER — Encounter: Payer: Self-pay | Admitting: Student

## 2023-05-06 VITALS — BP 134/74 | HR 76 | Ht 68.0 in | Wt 168.6 lb

## 2023-05-06 DIAGNOSIS — I10 Essential (primary) hypertension: Secondary | ICD-10-CM

## 2023-05-06 DIAGNOSIS — I480 Paroxysmal atrial fibrillation: Secondary | ICD-10-CM

## 2023-05-06 DIAGNOSIS — I5022 Chronic systolic (congestive) heart failure: Secondary | ICD-10-CM

## 2023-05-06 DIAGNOSIS — I2510 Atherosclerotic heart disease of native coronary artery without angina pectoris: Secondary | ICD-10-CM

## 2023-05-06 NOTE — Patient Instructions (Signed)
Medication Instructions:  NO CHANGES  *If you need a refill on your cardiac medications before your next appointment, please call your pharmacy*    Follow-Up: At [facility], you and your health needs are our priority.  As part of our continuing mission to provide you with exceptional heart care, we have created designated Provider Care Teams.  These Care Teams include your primary Cardiologist (physician) and Advanced Practice Providers (APPs -  Physician Assistants and Nurse Practitioners) who all work together to provide you with the care you need, when you need it.  We recommend signing up for the patient portal called "MyChart".  Sign up information is provided on this After Visit Summary.  MyChart is used to connect with patients for Virtual Visits (Telemedicine).  Patients are able to view lab/test results, encounter notes, upcoming appointments, etc.  Non-urgent messages can be sent to your provider as well.   To learn more about what you can do with MyChart, go to [email or web address].    Your next appointment:    November 19 with Dr. Tresa Endo

## 2023-05-06 NOTE — Progress Notes (Signed)
Cardiology Clinic Note   Date: 05/06/2023 ID: [name], DOB May 13, 1941, MRN [id number]  Primary Cardiologist:  Nicki Guadalajara, MD  Patient Profile    MARS Stevens is a 82 y.o. male who presents to the clinic today for hospital follow up.     Past medical history significant for: Nonobstructive CAD. LHC 11/25/2011: Nonobstructive CAD with the exception of pre-existing diagonal lesion as previously described. LHC 11/07/2019 (unstable angina): Proximal to mid LAD 30%.  D1 80%.  Stable stenosis D1 unchanged from previous catheterization 2009. Chronic systolic heart failure/nonischemic cardiomyopathy. Echo 04/30/2023: EF 35 to 40%.  RWMA.  Grade I DD.  Mild to moderate LAE.  Trivial MR.  Unchanged from 01/26/2022. Ventricular tachycardia. V. tach ablation 06/06/2012. Sinus bradycardia/second-degree AV block. PPM implantation 07/12/2018. PAF. PAD. Hypertension. Hyperlipidemia. Lipid panel 02/17/2023: LDL 61, HDL 40, TG 103, total 120. COPD. GERD. CKD stage III. Syncope/V. tach. PPM upgrade to CRT-D 11/08/2019. Remote device check 01/25/2023: Normal device function. 1.1% A-fib burden.  Good battery status.  Stable lead measurements.     History of Present Illness    [name] is a longtime patient of cardiology. He is followed by Dr. Tresa Endo and Dr. Royann Shivers for the above outlined history.   Patient was last seen in the office by me on 04/12/2023 for routine follow-up.  He reported intermittent episodes of shortness of breath and DOE relieved with rest and albuterol inhaler.  He was referred to pulmonology.  Patient presented to the ED on 04/29/2023 with complaints of chest pain beginning the prior morning while walking and improvement after NTG.  EKG without ischemic changes.  Troponins flat. Echo showed EF 35-40%, grade I DD and unchanged from 2023.  Evaluated by cardiology and found to be tender to palpation right and left chest wall.  Symptoms were not felt to be due to  pericarditis.  Suspect costochondritis.  Given 5 days of prednisone.  Patient was discharged on 04/30/2023 to follow-up with cardiology as an outpatient.  Today, patient is doing well since his hospital visit. Patient denies shortness of breath or dyspnea on exertion. No chest pain, pressure, or tightness. Denies lower extremity edema, orthopnea, or PND. No palpitations. He reports he has felt great since his short course of prednisone. Daily weight is stable.      ROS: All other systems reviewed and are otherwise negative except as noted in History of Present Illness.  Studies Reviewed    ECG is not ordered today.   Risk Assessment/Calculations     CHA2DS2-VASc Score = 5   This indicates a 7.2% annual risk of stroke. The patient's score is based upon: CHF History: 1 HTN History: 1 Diabetes History: 0 Stroke History: 0 Vascular Disease History: 1 Age Score: 2 Gender Score: 0             Physical Exam    VS:  BP 134/74 (BP Location: Left Arm, Patient Position: Sitting, Cuff Size: Normal)   Pulse 76   Ht 5\' 8"  (1.727 m)   Wt 168 lb 9.6 oz (76.5 kg)   SpO2 98%   BMI 25.64 kg/m  , BMI Body mass index is 25.64 kg/m.  GEN: Well nourished, well developed, in no acute distress. Neck: No JVD or carotid bruits. Cardiac:  RRR. No murmurs. No rubs or gallops.   Respiratory:  Respirations regular and unlabored. Clear to auscultation without rales, wheezing or rhonchi. GI: Soft, nontender, nondistended. Extremities: Radials/DP/PT 2+ and equal bilaterally. No clubbing or  cyanosis. No edema.  Skin: Warm and dry, no rash. Neuro: Strength intact.  Assessment & Plan    Chronic systolic heart failure/ischemic cardiomyopathy.  Echo June 2024 showed EF 35 to 40%, Grade I DD.  Patient denies lower extremity edema, DOE, shortness of breath, orthopnea or PND. Brisk diuresis with lasix.  Euvolemic and well compensated on exam.  Continue Entresto, metoprolol, spironolactone, Lasix,  isosorbide. Nonobstructive CAD.  LHC December 2020 showed proximal to mid LAD 30%, D1 80% unchanged from 2009.  Patient denies chest pain, pressure, tightness. Continue, isosorbide, as needed NTG.  Not on aspirin secondary to Eliquis. PAF.  CRT-D check March 2024 showed 1.1% A-fib burden.  Patient denies palpitations. Reports occasional bloody nose but does not have prolonged bleeding, otherwise no other bleeding concerns.  RRR on exam today. Continue metoprolol and Eliquis. Appropriate Eliquis dose.   Hypertension. BP today 134/74.  Patient denies headaches, dizziness or vision changes. Continue Entresto, metoprolol, isosorbide.  Disposition: Return for previously scheduled visit with Dr. Tresa Endo in November or sooner as needed.          Signed, Etta Grandchild. Glenisha Gundry, DNP, NP-C

## 2023-05-30 ENCOUNTER — Other Ambulatory Visit: Payer: Self-pay | Admitting: Family Medicine

## 2023-05-30 ENCOUNTER — Ambulatory Visit
Admission: RE | Admit: 2023-05-30 | Discharge: 2023-05-30 | Disposition: A | Discharge status: Home / Self Care | Payer: 59 | Source: Ambulatory Visit | Attending: Family Medicine | Admitting: Family Medicine

## 2023-05-30 DIAGNOSIS — R296 Repeated falls: Secondary | ICD-10-CM

## 2023-05-30 DIAGNOSIS — S2231XA Fracture of one rib, right side, initial encounter for closed fracture: Secondary | ICD-10-CM | POA: Diagnosis not present

## 2023-05-30 DIAGNOSIS — R0781 Pleurodynia: Secondary | ICD-10-CM

## 2023-06-02 ENCOUNTER — Emergency Department (HOSPITAL_COMMUNITY)
Admission: EM | Admit: 2023-06-02 | Discharge: 2023-06-02 | Disposition: A | Discharge status: Home / Self Care | Payer: 59 | Attending: Emergency Medicine | Admitting: Emergency Medicine

## 2023-06-02 ENCOUNTER — Other Ambulatory Visit: Payer: Self-pay

## 2023-06-02 ENCOUNTER — Encounter (HOSPITAL_COMMUNITY): Payer: Self-pay

## 2023-06-02 ENCOUNTER — Emergency Department (HOSPITAL_COMMUNITY): Payer: 59

## 2023-06-02 DIAGNOSIS — I130 Hypertensive heart and chronic kidney disease with heart failure and stage 1 through stage 4 chronic kidney disease, or unspecified chronic kidney disease: Secondary | ICD-10-CM | POA: Diagnosis not present

## 2023-06-02 DIAGNOSIS — R079 Chest pain, unspecified: Secondary | ICD-10-CM | POA: Diagnosis not present

## 2023-06-02 DIAGNOSIS — S3993XA Unspecified injury of pelvis, initial encounter: Secondary | ICD-10-CM | POA: Diagnosis not present

## 2023-06-02 DIAGNOSIS — R918 Other nonspecific abnormal finding of lung field: Secondary | ICD-10-CM | POA: Insufficient documentation

## 2023-06-02 DIAGNOSIS — R0789 Other chest pain: Secondary | ICD-10-CM | POA: Diagnosis not present

## 2023-06-02 DIAGNOSIS — I2510 Atherosclerotic heart disease of native coronary artery without angina pectoris: Secondary | ICD-10-CM | POA: Diagnosis not present

## 2023-06-02 DIAGNOSIS — S3991XA Unspecified injury of abdomen, initial encounter: Secondary | ICD-10-CM | POA: Diagnosis not present

## 2023-06-02 DIAGNOSIS — I5022 Chronic systolic (congestive) heart failure: Secondary | ICD-10-CM | POA: Insufficient documentation

## 2023-06-02 DIAGNOSIS — S299XXA Unspecified injury of thorax, initial encounter: Secondary | ICD-10-CM | POA: Diagnosis not present

## 2023-06-02 DIAGNOSIS — R911 Solitary pulmonary nodule: Secondary | ICD-10-CM | POA: Diagnosis not present

## 2023-06-02 DIAGNOSIS — N183 Chronic kidney disease, stage 3 unspecified: Secondary | ICD-10-CM | POA: Insufficient documentation

## 2023-06-02 DIAGNOSIS — R109 Unspecified abdominal pain: Secondary | ICD-10-CM | POA: Diagnosis not present

## 2023-06-02 LAB — CBC WITH DIFFERENTIAL/PLATELET
Abs Immature Granulocytes: 0.02 10*3/uL (ref 0.00–0.07)
Basophils Absolute: 0 10*3/uL (ref 0.0–0.1)
Basophils Relative: 1 %
Eosinophils Absolute: 0.2 10*3/uL (ref 0.0–0.5)
Eosinophils Relative: 5 %
HCT: 43.9 % (ref 39.0–52.0)
Hemoglobin: 14.4 g/dL (ref 13.0–17.0)
Immature Granulocytes: 1 %
Lymphocytes Relative: 36 %
Lymphs Abs: 1.6 10*3/uL (ref 0.7–4.0)
MCH: 30.7 pg (ref 26.0–34.0)
MCHC: 32.8 g/dL (ref 30.0–36.0)
MCV: 93.6 fL (ref 80.0–100.0)
Monocytes Absolute: 0.5 10*3/uL (ref 0.1–1.0)
Monocytes Relative: 12 %
Neutro Abs: 2 10*3/uL (ref 1.7–7.7)
Neutrophils Relative %: 45 %
Platelets: 142 10*3/uL — ABNORMAL LOW (ref 150–400)
RBC: 4.69 MIL/uL (ref 4.22–5.81)
RDW: 12.8 % (ref 11.5–15.5)
WBC: 4.4 10*3/uL (ref 4.0–10.5)
nRBC: 0 % (ref 0.0–0.2)

## 2023-06-02 LAB — COMPREHENSIVE METABOLIC PANEL
ALT: 14 U/L (ref 0–44)
AST: 20 U/L (ref 15–41)
Albumin: 3.6 g/dL (ref 3.5–5.0)
Alkaline Phosphatase: 63 U/L (ref 38–126)
Anion gap: 7 (ref 5–15)
BUN: 16 mg/dL (ref 8–23)
CO2: 24 mmol/L (ref 22–32)
Calcium: 9.1 mg/dL (ref 8.9–10.3)
Chloride: 110 mmol/L (ref 98–111)
Creatinine, Ser: 1.51 mg/dL — ABNORMAL HIGH (ref 0.61–1.24)
GFR, Estimated: 45 mL/min — ABNORMAL LOW (ref >60)
Glucose, Bld: 104 mg/dL — ABNORMAL HIGH (ref 70–99)
Potassium: 3.8 mmol/L (ref 3.5–5.1)
Sodium: 141 mmol/L (ref 135–145)
Total Bilirubin: 0.5 mg/dL (ref 0.3–1.2)
Total Protein: 7.5 g/dL (ref 6.5–8.1)

## 2023-06-02 LAB — TROPONIN I (HIGH SENSITIVITY)
Troponin I (High Sensitivity): 19 ng/L — ABNORMAL HIGH (ref <18)
Troponin I (High Sensitivity): 15 ng/L (ref <18)

## 2023-06-02 LAB — LIPASE, BLOOD: Lipase: 26 U/L (ref 11–51)

## 2023-06-02 MED ORDER — IOHEXOL 300 MG/ML  SOLN
80.0000 mL | Freq: Once | INTRAMUSCULAR | Status: AC | PRN
  Administered 2023-06-02: 80 mL via INTRAVENOUS

## 2023-06-02 MED ORDER — ACETAMINOPHEN 500 MG PO TABS
1000.0000 mg | ORAL_TABLET | Freq: Once | ORAL | Status: AC
  Administered 2023-06-02: 1000 mg via ORAL
  Filled 2023-06-02: qty 2

## 2023-06-02 MED ORDER — DICLOFENAC SODIUM 1 % EX GEL: 2.0000 g | Freq: Four times a day (QID) | CUTANEOUS | 0 refills | Status: DC

## 2023-06-02 NOTE — Discharge Instructions (Addendum)
You were seen in the emergency room today with pain after a fall.  I suspect you have some bruising and muscle pull after your fall but no broken ribs or bleeding.  Your CT scan today did show some concerning findings for possible developing lung cancer.  You will need to follow with both your primary care doctor as well as the oncology group listed.  Please call for an appointment soon as possible.  Return to the emergency department the nearest any worsening symptoms.

## 2023-06-02 NOTE — ED Triage Notes (Signed)
Patient reports left sided flank pain x 6 days. Patient reports left sided chest pain, dizziness, and SOB x 3 days. Denies nausea, vomiting, and urinary symptoms. NAD, VSS

## 2023-06-02 NOTE — ED Provider Notes (Signed)
Emergency Department Provider Note   I have reviewed the triage vital signs and the nursing notes.   HISTORY  Chief Complaint Flank Pain and Chest Pain   HPI [name] is a 82 y.o. male past history of CAD, CHF, CKD, AICD presents to the emergency department with left chest and flank pain.  This began after a fall on July 2.  He was walking at the grocery store and tripped on some uneven pavement falling to his left side.  Has had pain and soreness since that time.  Pain is worse with movement or touching the area.  No bleeding.  He did not strike his head or lose consciousness.  He finds it painful to lie on his left side and this morning had sudden worsening pain with twisting.  No numbness or weakness in the arms or legs.  No anterior abdominal pain.  No midline back pain.   Past Medical History:  Diagnosis Date   AICD (automatic cardioverter/defibrillator) present    CAD (coronary artery disease) 80% stenosis diag of the LAD, 30% in OM2 branch of LCX in 2009    a. Nonobstructive CAD by cath 11/2011 with the exception of the pre-existing diagonal branch #2 lesion.   Chronic systolic CHF (congestive heart failure) (HCC)    CKD (chronic kidney disease) stage 3, GFR 30-59 ml/min (HCC)    Colon polyp, hyperplastic    History of stress test 06/01/2012   Normal myocardial perfusion study. compared to the previous study there is no significant change. this is a low risk scan   Hypertension    Legally blind    "both eyes"   Myocardial infarction [facility]) 11/22/2011   NICM (nonischemic cardiomyopathy) (HCC)    a. Remote hx of dilated NICM with EF ranging 20-45%, including normal EF by echo (55-60%) in 2014.   Peripheral arterial disease (HCC)    a. 06/2014: ABI right 0.99, left 1.2, LE dopplers revealing an occluded right posterior tibial. As symptoms were not felt r/t claudication, no further w/u at the time.   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 Requiring discontinuation of BB dose.   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 Respiratory: Denies shortness of breath. Gastrointestinal: Positive left flank/abdominal pain.  No nausea, no vomiting.  No diarrhea.  Musculoskeletal: Negative for back pain. Skin: Negative for rash. Neurological: Negative for headaches, focal weakness or numbness. ____________________________________________   PHYSICAL EXAM:  VITAL SIGNS: ED Triage Vitals  Encounter Vitals Group     BP 06/02/23 1024 139/69     Pulse Rate 06/02/23 1024 99     Resp 06/02/23 1024 18     Temp 06/02/23 1024 97.6 F (36.4 C)     Temp Source 06/02/23 1024 Oral     SpO2 06/02/23 1024 100 %     Weight 06/02/23 1026 169 lb (76.7 kg)     Height 06/02/23 1026 5\' 8"  (1.727 m)   Constitutional: Alert and oriented. Well appearing and in no acute distress. Eyes: Conjunctivae are normal.  Head: Atraumatic. Nose: No congestion/rhinnorhea. Mouth/Throat: Mucous membranes are moist.   Neck: No stridor.  No cervical spine tenderness to palpation. Cardiovascular: Normal rate, regular rhythm. Good peripheral circulation. Grossly normal heart sounds.   Respiratory: Normal respiratory effort.  No retractions.  Lungs CTAB. Gastrointestinal: Soft with mild tenderness to the left abdomen. No distention.  Musculoskeletal: No lower extremity tenderness nor edema. No gross deformities of extremities. Neurologic:  Normal speech and language. No gross focal neurologic deficits are appreciated.  Skin:  Skin is warm, dry and intact. No rash noted.  ____________________________________________   LABS (all labs ordered are listed, but only abnormal results are displayed)  Labs Reviewed  COMPREHENSIVE METABOLIC PANEL - Abnormal; Notable for the  following components:      Result Value   Glucose, Bld 104 (*)    Creatinine, Ser 1.51 (*)    GFR, Estimated 45 (*)    All other components within normal limits  CBC WITH DIFFERENTIAL/PLATELET - Abnormal; Notable for the following components:   Platelets 142 (*)    All other components within normal limits  TROPONIN I (HIGH SENSITIVITY) - Abnormal; Notable for the following components:   Troponin I (High Sensitivity) 19 (*)    All other components within normal limits  LIPASE, BLOOD  URINALYSIS, ROUTINE W REFLEX MICROSCOPIC  TROPONIN I (HIGH SENSITIVITY)   ____________________________________________  EKG   EKG Interpretation Date/Time:  Thursday June 02 2023 10:34:19 EDT Ventricular Rate:  100 PR Interval:  112 QRS Duration:  152 QT Interval:  406 QTC Calculation: 524 R Axis:   -86  Text Interpretation: V paced rhythm IVCD, consider atypical RBBB Probable anterolateral infarct, old Baseline wander in lead(s) V1 Confirmed by Alona Bene [phone]) on 06/02/2023 10:36:56 AM        ____________________________________________  RADIOLOGY  CT CHEST ABDOMEN PELVIS W CONTRAST  Result Date: 06/02/2023 CLINICAL DATA:  Polytrauma, blunt.  Left flank pain EXAM: CT CHEST, ABDOMEN, AND PELVIS WITH CONTRAST TECHNIQUE: Multidetector CT imaging of the chest, abdomen and pelvis was performed following the standard protocol during bolus administration of intravenous contrast. RADIATION DOSE REDUCTION: This exam was performed according to the departmental dose-optimization program which includes automated exposure control, adjustment of the mA and/or kV according to patient size and/or use of iterative reconstruction technique. CONTRAST:  80mL OMNIPAQUE IOHEXOL 300 MG/ML  SOLN COMPARISON:  07/28/2022, 12/19/2019 FINDINGS: CT CHEST FINDINGS Cardiovascular: Left-sided implanted cardiac device. Mild cardiomegaly. No pericardial effusion. Thoracic aorta is nonaneurysmal. Atherosclerotic  calcifications of the aorta and coronary arteries. Central pulmonary vasculature is nondilated. Mediastinum/Nodes: Mildly enlarged mediastinal lymph nodes including 10 mm lower right paratracheal node (series 2, image 25) and 10 mm AP window node (series 2, image 21). No enlarged axillary or hilar lymph nodes. Thyroid gland, trachea, and esophagus demonstrate no significant abnormality. Lungs/Pleura: Solid nodule at the posterior aspect of the left upper lobe with spiculated margins measuring 2.7 x 2.0 x 2.4 cm (series 6, image 24). Adjacent ground-glass opacity. Irregular nodular density within the lingula has increased in size, now measuring approximately 4.6 x 1.7 x 3.8 cm (series 6, image 81), previously measuring approximately 3.1 x 1.3 x 2.8 cm on 09/15/2020. Mild emphysema. No pleural effusion or pneumothorax. Musculoskeletal: No acute bony abnormality. No chest wall abnormality. CT ABDOMEN PELVIS FINDINGS Hepatobiliary: No focal liver abnormality is seen. Status post cholecystectomy. No biliary dilatation. Pancreas: Unremarkable. No pancreatic ductal dilatation or surrounding inflammatory changes. Spleen: Nonspecific 1.5 cm hyperenhancing nodule within the superior aspect of the spleen. No splenomegaly. Adrenals/Urinary Tract: Unremarkable adrenal glands. Nonobstructing stone in the right kidney. Unchanged bilateral renal cysts which do not require follow-up imaging. No hydronephrosis. Urinary bladder within normal limits. Stomach/Bowel: Stomach is within normal limits. Appendix appears normal. No evidence of bowel wall  thickening, distention, or inflammatory changes. Vascular/Lymphatic: Aortic atherosclerosis. No enlarged abdominal or pelvic lymph nodes. Reproductive: Coarse calcifications within a nonenlarged prostate gland. Other: No abdominal wall hernia or abnormality. No abdominopelvic ascites. Musculoskeletal: No acute bony abnormality. Extensive postsurgical changes to the thoracolumbar spine.  IMPRESSION: 1. No acute traumatic injury within the chest, abdomen, or pelvis. 2. Solid nodule at the posterior aspect of the left upper lobe with spiculated margins measuring 2.7 x 2.0 x 2.4 cm. Adjacent ground-glass opacity. Findings are highly suspicious for primary bronchogenic neoplasm. Recommend consultation with pulmonology/multidisciplinary tumor board and follow-up with PET-CT and/or tissue sampling. 3. Irregular nodular density within the lingula has increased in size, now measuring approximately 4.6 x 1.7 x 3.8 cm, previously measuring approximately 3.1 x 1.3 x 2.8 cm on 09/15/2020. 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 4. Mildly enlarged mediastinal lymph nodes, nonspecific. Metastatic disease not excluded. 5. Nonspecific 1.5 cm hyperenhancing nodule within the superior aspect of the spleen. Attention on follow-up. 6. Nonobstructing right renal stone. Aortic Atherosclerosis (ICD10-I70.0) and Emphysema (ICD10-J43.9). Electronically Signed   By: Duanne Guess D.O.   On: 06/02/2023 14:27   DG Ribs Unilateral W/Chest Left  Result Date: 06/02/2023 CLINICAL DATA:  Fall. Left chest pain, dizziness, shortness of breath for 3 days. EXAM: LEFT RIBS AND CHEST - 3+ VIEW COMPARISON:  05/30/2023 FINDINGS: Airspace opacities still present in the left lower lung, most consistent with pneumonia. More focal nodular opacity noted in the left lung apex measuring 1.9 cm. Postsurgical changes of AICD, left rotator cuff, lumbar spine again seen. No displaced rib fractures. IMPRESSION: 1. No displaced left rib fractures. 2. Persistent left basilar airspace opacity likely due to pneumonia. 3. 1.9 cm nodular opacity at the left lung apex suspicious for malignancy. Further evaluation with nonemergent contrast-enhanced chest CT should be performed when patient condition allows. Electronically Signed   By: Acquanetta Belling M.D.   On: 06/02/2023 12:37   DG Pelvis 1-2 Views  Result Date:  06/02/2023 CLINICAL DATA:  Patient reports left sided flank pain x 6 days, chest pain, dizziness, and SOB x 3 days. Fall. EXAM: PELVIS - 1-2 VIEW COMPARISON:  Renal stone protocol CT 07/28/2022 FINDINGS: Postsurgical changes of the lumbar spine are partially visualized. Prostate calcifications again seen. No fracture or dislocation. IMPRESSION: No acute abnormality of the pelvis. Electronically Signed   By: Acquanetta Belling M.D.   On: 06/02/2023 12:33    ____________________________________________   PROCEDURES  Procedure(s) performed:   Procedures  None  ____________________________________________   INITIAL IMPRESSION / ASSESSMENT AND PLAN / ED COURSE  Pertinent labs & imaging results that were available during my care of the patient were reviewed by me and considered in my medical decision making (see chart for details).   This patient is Presenting for Evaluation of CP, which does require a range of treatment options, and is a complaint that involves a high risk of morbidity and mortality.  The Differential Diagnoses includes but is not exclusive to acute coronary syndrome, aortic dissection, pulmonary embolism, cardiac tamponade, community-acquired pneumonia, pericarditis, musculoskeletal chest wall pain, etc.  Critical Interventions-    Medications  acetaminophen (TYLENOL) tablet 1,000 mg (1,000 mg Oral Given 06/02/23 1055)  iohexol (OMNIPAQUE) 300 MG/ML solution 80 mL (80 mLs Intravenous Contrast Given 06/02/23 1325)    Reassessment after intervention:  pain improved.   I decided to review pertinent External Data, and in summary no ED visits after fall on July 2nd.   Clinical Laboratory Tests Ordered,  included   Radiologic Tests Ordered, included CXR. I independently interpreted the images and agree with radiology interpretation.   Cardiac Monitor Tracing which shows paced rhythm.    Social Determinants of Health Risk patient is a non-smoker.   Medical Decision Making:  Summary:  Patient presents emergency department for evaluation of pain after fall.  He fell 9 days prior.  Considered alternate etiology for chest pain including vascular etiology/dissection but pain is easily reproducible on exam. Suspect pain from trauma.   Reevaluation with update and discussion with patient. Discussed CT findings including lung masses concerning for lung CA. Referral placed for oncology follow up and he will reach out to his PCP for assistance as well.   Patient's presentation is most consistent with acute presentation with potential threat to life or bodily function.   Disposition: discharge   ____________________________________________  FINAL CLINICAL IMPRESSION(S) / ED DIAGNOSES  Final diagnoses:  Chest wall pain  Flank pain  Lung mass    NEW OUTPATIENT MEDICATIONS STARTED DURING THIS VISIT:  New Prescriptions   DICLOFENAC SODIUM (VOLTAREN) 1 % GEL    Apply 2 g topically 4 (four) times daily.    Note:  This document was prepared using Dragon voice recognition software and may include unintentional dictation errors.  Alona Bene, MD, [facility] Emergency Medicine    Elfego Giammarino, Arlyss Repress, MD 06/02/23 [phone]

## 2023-06-06 ENCOUNTER — Ambulatory Visit: Payer: 59 | Admitting: Internal Medicine

## 2023-06-06 ENCOUNTER — Other Ambulatory Visit: Payer: 59

## 2023-06-06 DIAGNOSIS — R911 Solitary pulmonary nodule: Secondary | ICD-10-CM | POA: Diagnosis not present

## 2023-06-06 DIAGNOSIS — I10 Essential (primary) hypertension: Secondary | ICD-10-CM | POA: Diagnosis not present

## 2023-06-07 DIAGNOSIS — H47013 Ischemic optic neuropathy, bilateral: Secondary | ICD-10-CM | POA: Diagnosis not present

## 2023-06-07 DIAGNOSIS — H401133 Primary open-angle glaucoma, bilateral, severe stage: Secondary | ICD-10-CM | POA: Diagnosis not present

## 2023-06-13 ENCOUNTER — Other Ambulatory Visit: Payer: Self-pay | Admitting: Medical Oncology

## 2023-06-13 DIAGNOSIS — R911 Solitary pulmonary nodule: Secondary | ICD-10-CM

## 2023-06-13 NOTE — Progress Notes (Signed)
cbc

## 2023-06-14 ENCOUNTER — Inpatient Hospital Stay: Payer: 59 | Attending: Internal Medicine

## 2023-06-14 ENCOUNTER — Inpatient Hospital Stay (HOSPITAL_BASED_OUTPATIENT_CLINIC_OR_DEPARTMENT_OTHER): Payer: 59 | Admitting: Internal Medicine

## 2023-06-14 VITALS — BP 154/83 | HR 70 | Temp 97.9°F | Resp 17 | Ht 67.0 in | Wt 170.3 lb

## 2023-06-14 DIAGNOSIS — Z79899 Other long term (current) drug therapy: Secondary | ICD-10-CM | POA: Insufficient documentation

## 2023-06-14 DIAGNOSIS — Z8719 Personal history of other diseases of the digestive system: Secondary | ICD-10-CM | POA: Diagnosis not present

## 2023-06-14 DIAGNOSIS — I2510 Atherosclerotic heart disease of native coronary artery without angina pectoris: Secondary | ICD-10-CM

## 2023-06-14 DIAGNOSIS — I5022 Chronic systolic (congestive) heart failure: Secondary | ICD-10-CM | POA: Insufficient documentation

## 2023-06-14 DIAGNOSIS — I252 Old myocardial infarction: Secondary | ICD-10-CM | POA: Insufficient documentation

## 2023-06-14 DIAGNOSIS — R911 Solitary pulmonary nodule: Secondary | ICD-10-CM

## 2023-06-14 DIAGNOSIS — R590 Localized enlarged lymph nodes: Secondary | ICD-10-CM

## 2023-06-14 DIAGNOSIS — W010XXA Fall on same level from slipping, tripping and stumbling without subsequent striking against object, initial encounter: Secondary | ICD-10-CM | POA: Insufficient documentation

## 2023-06-14 DIAGNOSIS — R0789 Other chest pain: Secondary | ICD-10-CM | POA: Insufficient documentation

## 2023-06-14 DIAGNOSIS — I429 Cardiomyopathy, unspecified: Secondary | ICD-10-CM | POA: Diagnosis not present

## 2023-06-14 DIAGNOSIS — R5383 Other fatigue: Secondary | ICD-10-CM | POA: Diagnosis not present

## 2023-06-14 DIAGNOSIS — I509 Heart failure, unspecified: Secondary | ICD-10-CM

## 2023-06-14 DIAGNOSIS — N200 Calculus of kidney: Secondary | ICD-10-CM | POA: Diagnosis not present

## 2023-06-14 DIAGNOSIS — Z9049 Acquired absence of other specified parts of digestive tract: Secondary | ICD-10-CM | POA: Insufficient documentation

## 2023-06-14 DIAGNOSIS — Z87891 Personal history of nicotine dependence: Secondary | ICD-10-CM | POA: Diagnosis not present

## 2023-06-14 DIAGNOSIS — C3412 Malignant neoplasm of upper lobe, left bronchus or lung: Secondary | ICD-10-CM | POA: Diagnosis not present

## 2023-06-14 DIAGNOSIS — I700 Atherosclerosis of aorta: Secondary | ICD-10-CM | POA: Insufficient documentation

## 2023-06-14 DIAGNOSIS — I130 Hypertensive heart and chronic kidney disease with heart failure and stage 1 through stage 4 chronic kidney disease, or unspecified chronic kidney disease: Secondary | ICD-10-CM | POA: Diagnosis not present

## 2023-06-14 DIAGNOSIS — Y92480 Sidewalk as the place of occurrence of the external cause: Secondary | ICD-10-CM | POA: Diagnosis not present

## 2023-06-14 DIAGNOSIS — Z825 Family history of asthma and other chronic lower respiratory diseases: Secondary | ICD-10-CM | POA: Diagnosis not present

## 2023-06-14 DIAGNOSIS — N1832 Chronic kidney disease, stage 3b: Secondary | ICD-10-CM | POA: Diagnosis not present

## 2023-06-14 DIAGNOSIS — I739 Peripheral vascular disease, unspecified: Secondary | ICD-10-CM | POA: Insufficient documentation

## 2023-06-14 DIAGNOSIS — Z7901 Long term (current) use of anticoagulants: Secondary | ICD-10-CM | POA: Insufficient documentation

## 2023-06-14 DIAGNOSIS — J439 Emphysema, unspecified: Secondary | ICD-10-CM | POA: Insufficient documentation

## 2023-06-14 DIAGNOSIS — N281 Cyst of kidney, acquired: Secondary | ICD-10-CM | POA: Insufficient documentation

## 2023-06-14 DIAGNOSIS — C3490 Malignant neoplasm of unspecified part of unspecified bronchus or lung: Secondary | ICD-10-CM

## 2023-06-14 LAB — CMP (CANCER CENTER ONLY)
ALT: 10 U/L (ref 0–44)
AST: 15 U/L (ref 15–41)
Albumin: 3.8 g/dL (ref 3.5–5.0)
Alkaline Phosphatase: 70 U/L (ref 38–126)
Anion gap: 5 (ref 5–15)
BUN: 16 mg/dL (ref 8–23)
CO2: 28 mmol/L (ref 22–32)
Calcium: 9.3 mg/dL (ref 8.9–10.3)
Chloride: 108 mmol/L (ref 98–111)
Creatinine: 1.5 mg/dL — ABNORMAL HIGH (ref 0.61–1.24)
GFR, Estimated: 46 mL/min — ABNORMAL LOW (ref >60)
Glucose, Bld: 97 mg/dL (ref 70–99)
Potassium: 3.8 mmol/L (ref 3.5–5.1)
Sodium: 141 mmol/L (ref 135–145)
Total Bilirubin: 0.7 mg/dL (ref 0.3–1.2)
Total Protein: 6.8 g/dL (ref 6.5–8.1)

## 2023-06-14 LAB — CBC WITH DIFFERENTIAL (CANCER CENTER ONLY)
Abs Immature Granulocytes: 0.02 10*3/uL (ref 0.00–0.07)
Basophils Absolute: 0 10*3/uL (ref 0.0–0.1)
Basophils Relative: 1 %
Eosinophils Absolute: 0.2 10*3/uL (ref 0.0–0.5)
Eosinophils Relative: 5 %
HCT: 39.2 % (ref 39.0–52.0)
Hemoglobin: 13.3 g/dL (ref 13.0–17.0)
Immature Granulocytes: 1 %
Lymphocytes Relative: 38 %
Lymphs Abs: 1.6 10*3/uL (ref 0.7–4.0)
MCH: 31.1 pg (ref 26.0–34.0)
MCHC: 33.9 g/dL (ref 30.0–36.0)
MCV: 91.8 fL (ref 80.0–100.0)
Monocytes Absolute: 0.5 10*3/uL (ref 0.1–1.0)
Monocytes Relative: 11 %
Neutro Abs: 1.9 10*3/uL (ref 1.7–7.7)
Neutrophils Relative %: 44 %
Platelet Count: 136 10*3/uL — ABNORMAL LOW (ref 150–400)
RBC: 4.27 MIL/uL (ref 4.22–5.81)
RDW: 12.6 % (ref 11.5–15.5)
WBC Count: 4.2 10*3/uL (ref 4.0–10.5)
nRBC: 0 % (ref 0.0–0.2)

## 2023-06-14 NOTE — Progress Notes (Signed)
[facility] [phone]   [phone]  CONSULT NOTE  REFERRING PHYSICIAN: Dr. [name]  REASON FOR CONSULTATION:  82 years old African-American male with suspicious lung cancer.  HPI [name] is a 82 y.o. male past medical history significant for multiple medical problems including coronary artery disease, congestive heart failure, cardiomyopathy, ventricular tachycardia status post AICD, myocardial infarction, peripheral arterial disease, PVCs as well as spondylosis and history of smoking but quit in 2001.  The patient mentions that he tripped on the sidewalk and was hurting on the left side of the chest and flank area.  He presented to the emergency department for evaluation.  He had CT scan of the chest, abdomen and pelvis performed on 06/02/2023 and that showed solid nodule at the posterior aspect of the left upper lobe with spiculated margin measuring 2.7 x 2.0 x 2.4 cm.  There was adjacent groundglass opacity.  There was irregular nodular density within the lingula increased in size and measuring 4.6 x 1.7 x 3.8 cm and previously measured 3.1 x 1.3 x 2.8 cm on imaging studies on September 15, 2020.  The patient also had mildly enlarged mediastinal lymph nodes including 1.0 cm lower right paratracheal node and 1.0 cm AP window node.  Was also nonspecific 1.5 cm hyperenhancing nodule within the superior aspect of the spleen but no splenomegaly.  These findings are highly suspicious for primary bronchogenic neoplasm. The patient was referred to me today for evaluation and recommendation regarding his condition.  When seen today he is feeling fine with no concerning complaints except for fatigue and mild cough.  He denied having any current chest pain, shortness of breath or hemoptysis.  He has no nausea, vomiting, diarrhea or constipation.  He has no headache or visual changes. Family history significant for mother with asthma and father has unknown  history. The patient is married and has been with his second wife for 8 months.  He had 2 daughter that were deceased.  He used to work as a [employer].  He was accompanied today by his wife [name].  He has a history of smoking for around 40 years and quit in 2001.  He also has a history of alcohol drinking but quit 50 years ago and no history of drug abuse. HPI  Past Medical History:  Diagnosis Date   AICD (automatic cardioverter/defibrillator) present    CAD (coronary artery disease) 80% stenosis diag of the LAD, 30% in OM2 branch of LCX in 2009    a. Nonobstructive CAD by cath 11/2011 with the exception of the pre-existing diagonal branch #2 lesion.   Chronic systolic CHF (congestive heart failure) (HCC)    CKD (chronic kidney disease) stage 3, GFR 30-59 ml/min (HCC)    Colon polyp, hyperplastic    History of stress test 06/01/2012   Normal myocardial perfusion study. compared to the previous study there is no significant change. this is a low risk scan   Hypertension    Legally blind    "both eyes"   Myocardial infarction [facility]) 11/22/2011   NICM (nonischemic cardiomyopathy) (HCC)    a. Remote hx of dilated NICM with EF ranging 20-45%, including normal EF by echo (55-60%) in 2014.   Peripheral arterial disease (HCC)    a. 06/2014: ABI right 0.99, left 1.2, LE dopplers revealing an occluded right posterior tibial. As symptoms were not felt r/t claudication, no further w/u at the time.   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 Requiring discontinuation of BB dose.   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    Past Surgical History:  Procedure Laterality Date   BACK SURGERY     BIV UPGRADE N/A 11/08/2019   Procedure: BIV UPGRADE;  Surgeon: Marinus Maw, MD;  Location: [facility] INVASIVE CV LAB;  Service: Cardiovascular;  Laterality: N/A;   CARDIAC  CATHETERIZATION  11/2011   CARDIAC CATHETERIZATION  11/2011   didn't demonstrate high grade obstructive disease to account for his LV dysfunction.   CATARACT EXTRACTION, BILATERAL  1990's   CYSTOSCOPY     CYSTOSCOPY WITH BIOPSY Bilateral 08/03/2021   Procedure: CYSTOSCOPY WITH BLADDER BIOPSY, BILATERAL RETROGRADE PYELOGRAM;  Surgeon: Crista Elliot, MD;  Location: [facility] ORS;  Service: Urology;  Laterality: Bilateral;  REQUESTING 45 MINS   CYSTOSCOPY WITH URETHRAL DILATATION N/A 05/04/2013   Procedure: CYSTOSCOPY WITH URETHRAL DILATATION;  Surgeon: Tia Alert, MD;  Location: [facility] NEURO ORS;  Service: Neurosurgery;  Laterality: N/A;  with insertion of foley catheter   EP study and ablation of VT  7/13   PVC focus mapped to the right coronary cusp of the aorta, limited ablation performed due to proximity of the focus to the right coronary artery   EYE SURGERY     LEFT HEART CATH AND CORONARY ANGIOGRAPHY N/A 11/07/2019   Procedure: LEFT HEART CATH AND CORONARY ANGIOGRAPHY;  Surgeon: Iran Ouch, MD;  Location: [facility] CV [facility];  Service: Cardiovascular;  Laterality: N/A;   LEFT HEART CATHETERIZATION WITH CORONARY ANGIOGRAM N/A 11/25/2011   Procedure: LEFT HEART CATHETERIZATION WITH CORONARY ANGIOGRAM;  Surgeon: Marykay Lex, MD;  Location[facility] CATH LAB;  Service: Cardiovascular;  Laterality: N/A;   PACEMAKER IMPLANT N/A 07/12/2018   Procedure: PACEMAKER IMPLANT;  Surgeon: Thurmon Fair, MD;  Location: [facility] INV[facility] CV [facility];  Service: Cardiovascular;  Laterality: N/A;   POSTERIOR FUSION LUMBAR SPINE  1979   ROTATOR CUFF REPAIR  2000's   left   V-TACH ABLATION N/A 06/06/2012   Procedure: V-TACH ABLATION;  Surgeon: Hillis Range, MD;  Location: [facility] CATH LAB;  Service: Cardiovascular;  Laterality: N/A;    Family History  Problem Relation Age of Onset   Asthma Mother    Other Other        Unsure if any heart disease in his family.    Social History Social History   Tobacco Use   Smoking  status: Former    Current packs/day: 0.00    Types: Cigarettes    Start date: 11/22/1949    Quit date: 11/23/1999    Years since quitting: 23.5   Smokeless tobacco: Never  Vaping Use   Vaping status: Never Used  Substance Use Topics   Alcohol use: No    Comment: 05/26/12 "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; stopped drinking in the early 1980's"   Drug use: No    No Known Allergies  Current Outpatient Medications  Medication Sig Dispense Refill   albuterol (VENTOLIN HFA) 108 (90 Base) MCG/ACT inhaler Inhale 1-2 puffs into the lungs every 6 (six) hours as needed for wheezing or shortness of breath.     apixaban (ELIQUIS) 2.5 MG TABS tablet Take 1 tablet (2.5 mg total) by mouth 2 (two) times daily. 180 tablet 3   BREO ELLIPTA 100-25 MCG/INH AEPB Inhale 1 puff into the lungs every morning.     brimonidine (ALPHAGAN)  0.2 % ophthalmic solution Place 1 drop into both eyes 2 (two) times daily.     cycloSPORINE (RESTASIS) 0.05 % ophthalmic emulsion Place 1 drop into both eyes 2 (two) times daily. (Patient not taking: Reported on 05/06/2023)     diclofenac Sodium (VOLTAREN) 1 % GEL Apply 2 g topically 4 (four) times daily. 100 g 0   dorzolamide-timolol (COSOPT) 22.3-6.8 MG/ML ophthalmic solution Place 1 drop into both eyes 2 (two) times daily.     fexofenadine (ALLEGRA) 180 MG tablet Take 180 mg by mouth daily as needed for allergies or rhinitis.     furosemide (LASIX) 20 MG tablet TAKE 1 TABLET(20 MG) BY MOUTH DAILY (Patient taking differently: Take 20 mg by mouth See admin instructions. Take 20 mg by mouth EVERY OTHER MORNING) 90 tablet 3   isosorbide mononitrate (IMDUR) 30 MG 24 hr tablet Take 30 mg by mouth daily.     metoprolol succinate (TOPROL-XL) 100 MG 24 hr tablet TAKE 1 TABLET(100 MG) BY MOUTH DAILY WITH OR IMMEDIATELY FOLLOWING A MEAL (Patient taking differently: Take 100 mg by mouth in the morning.) 90 tablet 3   nitroGLYCERIN (NITROSTAT) 0.4 MG SL tablet Place 1 tablet (0.4 mg total) under the tongue  every 5 (five) minutes as needed for chest pain. 25 tablet 1   ROCKLATAN 0.02-0.005 % SOLN Apply 1 drop to eye at bedtime.     sacubitril-valsartan (ENTRESTO) 24-26 MG Take 1 tablet by mouth 2 (two) times daily. 60 tablet 11   spironolactone (ALDACTONE) 25 MG tablet Take 12.5 mg by mouth daily.     No current facility-administered medications for this visit.    Review of Systems  Constitutional: positive for fatigue Eyes: negative Ears, nose, mouth, throat, and face: negative Respiratory: negative Cardiovascular: negative Gastrointestinal: negative Genitourinary:negative Integument/breast: negative Hematologic/lymphatic: negative Musculoskeletal:negative Neurological: negative Behavioral/Psych: negative Endocrine: negative Allergic/Immunologic: negative  Physical Exam  ZOX:WRUEA, healthy, no distress, well nourished, and well developed SKIN: skin color, texture, turgor are normal, no rashes or significant lesions HEAD: Normocephalic, No masses, lesions, tenderness or abnormalities EYES: normal, PERRLA, Conjunctiva are pink and non-injected EARS: External ears normal, Canals clear OROPHARYNX:no exudate, no erythema, and lips, buccal mucosa, and tongue normal  NECK: supple, no adenopathy, no JVD LYMPH:  no palpable lymphadenopathy, no hepatosplenomegaly LUNGS: clear to auscultation , and palpation HEART: regular rate & rhythm, no murmurs, and no gallops ABDOMEN:abdomen soft, non-tender, normal bowel sounds, and no masses or organomegaly BACK: Back symmetric, no curvature., No CVA tenderness EXTREMITIES:no joint deformities, effusion, or inflammation, no edema  NEURO: alert & oriented x 3 with fluent speech, no focal motor/sensory deficits  PERFORMANCE STATUS: ECOG 1  LABORATORY DATA: Lab Results  Component Value Date   WBC 4.4 06/02/2023   HGB 14.4 06/02/2023   HCT 43.9 06/02/2023   MCV 93.6 06/02/2023   PLT 142 (L) 06/02/2023      Chemistry      Component Value  Date/Time   NA 141 06/02/2023 1052   NA 142 11/11/2022 1000   K 3.8 06/02/2023 1052   CL 110 06/02/2023 1052   CO2 24 06/02/2023 1052   BUN 16 06/02/2023 1052   BUN 18 11/11/2022 1000   CREATININE 1.51 (H) 06/02/2023 1052   CREATININE 1.37 (H) 01/24/2017 0950      Component Value Date/Time   CALCIUM 9.1 06/02/2023 1052   ALKPHOS 63 06/02/2023 1052   AST 20 06/02/2023 1052   ALT 14 06/02/2023 1052   BILITOT 0.5 06/02/2023 1052  BILITOT 0.6 01/12/2022 6045       RADIOGRAPHIC STUDIES: CT CHEST ABDOMEN PELVIS W CONTRAST  Result Date: 06/02/2023 CLINICAL DATA:  Polytrauma, blunt.  Left flank pain EXAM: CT CHEST, ABDOMEN, AND PELVIS WITH CONTRAST TECHNIQUE: Multidetector CT imaging of the chest, abdomen and pelvis was performed following the standard protocol during bolus administration of intravenous contrast. RADIATION DOSE REDUCTION: This exam was performed according to the departmental dose-optimization program which includes automated exposure control, adjustment of the mA and/or kV according to patient size and/or use of iterative reconstruction technique. CONTRAST:  80mL OMNIPAQUE IOHEXOL 300 MG/ML  SOLN COMPARISON:  07/28/2022, 12/19/2019 FINDINGS: CT CHEST FINDINGS Cardiovascular: Left-sided implanted cardiac device. Mild cardiomegaly. No pericardial effusion. Thoracic aorta is nonaneurysmal. Atherosclerotic calcifications of the aorta and coronary arteries. Central pulmonary vasculature is nondilated. Mediastinum/Nodes: Mildly enlarged mediastinal lymph nodes including 10 mm lower right paratracheal node (series 2, image 25) and 10 mm AP window node (series 2, image 21). No enlarged axillary or hilar lymph nodes. Thyroid gland, trachea, and esophagus demonstrate no significant abnormality. Lungs/Pleura: Solid nodule at the posterior aspect of the left upper lobe with spiculated margins measuring 2.7 x 2.0 x 2.4 cm (series 6, image 24). Adjacent ground-glass opacity. Irregular nodular  density within the lingula has increased in size, now measuring approximately 4.6 x 1.7 x 3.8 cm (series 6, image 81), previously measuring approximately 3.1 x 1.3 x 2.8 cm on 09/15/2020. Mild emphysema. No pleural effusion or pneumothorax. Musculoskeletal: No acute bony abnormality. No chest wall abnormality. CT ABDOMEN PELVIS FINDINGS Hepatobiliary: No focal liver abnormality is seen. Status post cholecystectomy. No biliary dilatation. Pancreas: Unremarkable. No pancreatic ductal dilatation or surrounding inflammatory changes. Spleen: Nonspecific 1.5 cm hyperenhancing nodule within the superior aspect of the spleen. No splenomegaly. Adrenals/Urinary Tract: Unremarkable adrenal glands. Nonobstructing stone in the right kidney. Unchanged bilateral renal cysts which do not require follow-up imaging. No hydronephrosis. Urinary bladder within normal limits. Stomach/Bowel: Stomach is within normal limits. Appendix appears normal. No evidence of bowel wall thickening, distention, or inflammatory changes. Vascular/Lymphatic: Aortic atherosclerosis. No enlarged abdominal or pelvic lymph nodes. Reproductive: Coarse calcifications within a nonenlarged prostate gland. Other: No abdominal wall hernia or abnormality. No abdominopelvic ascites. Musculoskeletal: No acute bony abnormality. Extensive postsurgical changes to the thoracolumbar spine. IMPRESSION: 1. No acute traumatic injury within the chest, abdomen, or pelvis. 2. Solid nodule at the posterior aspect of the left upper lobe with spiculated margins measuring 2.7 x 2.0 x 2.4 cm. Adjacent ground-glass opacity. Findings are highly suspicious for primary bronchogenic neoplasm. Recommend consultation with pulmonology/multidisciplinary tumor board and follow-up with PET-CT and/or tissue sampling. 3. Irregular nodular density within the lingula has increased in size, now measuring approximately 4.6 x 1.7 x 3.8 cm, previously measuring approximately 3.1 x 1.3 x 2.8 cm on  09/15/2020. 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 4. Mildly enlarged mediastinal lymph nodes, nonspecific. Metastatic disease not excluded. 5. Nonspecific 1.5 cm hyperenhancing nodule within the superior aspect of the spleen. Attention on follow-up. 6. Nonobstructing right renal stone. Aortic Atherosclerosis (ICD10-I70.0) and Emphysema (ICD10-J43.9). Electronically Signed   By: Duanne Guess D.O.   On: 06/02/2023 14:27   DG Ribs Unilateral W/Chest Left  Result Date: 06/02/2023 CLINICAL DATA:  Fall. Left chest pain, dizziness, shortness of breath for 3 days. EXAM: LEFT RIBS AND CHEST - 3+ VIEW COMPARISON:  05/30/2023 FINDINGS: Airspace opacities still present in the left lower lung, most consistent with pneumonia. More focal nodular opacity noted in the left  lung apex measuring 1.9 cm. Postsurgical changes of AICD, left rotator cuff, lumbar spine again seen. No displaced rib fractures. IMPRESSION: 1. No displaced left rib fractures. 2. Persistent left basilar airspace opacity likely due to pneumonia. 3. 1.9 cm nodular opacity at the left lung apex suspicious for malignancy. Further evaluation with nonemergent contrast-enhanced chest CT should be performed when patient condition allows. Electronically Signed   By: Acquanetta Belling M.D.   On: 06/02/2023 12:37   DG Pelvis 1-2 Views  Result Date: 06/02/2023 CLINICAL DATA:  Patient reports left sided flank pain x 6 days, chest pain, dizziness, and SOB x 3 days. Fall. EXAM: PELVIS - 1-2 VIEW COMPARISON:  Renal stone protocol CT 07/28/2022 FINDINGS: Postsurgical changes of the lumbar spine are partially visualized. Prostate calcifications again seen. No fracture or dislocation. IMPRESSION: No acute abnormality of the pelvis. Electronically Signed   By: Acquanetta Belling M.D.   On: 06/02/2023 12:33   DG Ribs Unilateral W/Chest Left  Result Date: 05/30/2023 CLINICAL DATA:  Left chest wall pain. EXAM: LEFT RIBS AND CHEST - 3+ VIEW COMPARISON:   Chest radiograph dated 05/29/2023. FINDINGS: Faint left infrahilar density may represent atelectasis. Developing infiltrate is not excluded. Clinical correlation is recommended. No pleural effusion or pneumothorax. The cardiac silhouette is within limits. Left pectoral AICD device. No acute osseous pathology. No displaced rib fractures. Thoracolumbar fusion hardware. IMPRESSION: 1. No displaced rib fractures. 2. Left infrahilar atelectasis versus developing infiltrate. Electronically Signed   By: Elgie Collard M.D.   On: 05/30/2023 15:47    ASSESSMENT: This is a very pleasant 82 years old African-American male with highly suspicious primary bronchogenic carcinoma presented with right upper lobe spiculated mass in addition to lingular lesion and suspicious mediastinal lymphadenopathy pending tissue diagnosis as well as staging workup.  PLAN: I had a lengthy discussion with the patient and his wife today about his current condition and further investigation to confirm his diagnosis. I recommended for the patient to complete the staging workup by ordering a PET scan as well as CT scan of the head to rule out brain metastasis.  The patient will not be able to have MRI of the brain because of the AICD placement. I will also refer the patient to Dr. Delton Coombes for reevaluation and consideration of bronchoscopy and biopsy of the 2 pulmonary lesions. I will see him back for follow-up visit in 3 weeks for evaluation and discussion of his scan results as well as biopsy and recommendation regarding treatment of his condition. The patient was advised to call immediately if he has any other concerning symptoms in the interval.  The patient voices understanding of current disease status and treatment options and is in agreement with the current care plan.  All questions were answered. The patient knows to call the clinic with any problems, questions or concerns. We can certainly see the patient much sooner if  necessary.  Thank you so much for allowing me to participate in the care of [name]. I will continue to follow up the patient with you and assist in his care.  The total time spent in the appointment was 60 minutes.  Disclaimer: This note was dictated with voice recognition software. Similar sounding words can inadvertently be transcribed and may not be corrected upon review.   Lajuana Matte June 14, 2023, 2:09 PM

## 2023-06-14 NOTE — Progress Notes (Signed)
This NN met with pt face to face today at his consult with Dr.Mohamed. Pt was accompanied by his wife, [name].  Please for the patient is to continue work up with PET scan and head CT. Pt has pacemaker and cannot get an MRI. NN will arrange for this appts and contact the pt once scheduled.  All questions answered. This NN provided pt with contact information and encouraged calling with any questions/concerns.

## 2023-06-22 ENCOUNTER — Encounter: Payer: Self-pay | Admitting: Emergency Medicine

## 2023-06-22 ENCOUNTER — Ambulatory Visit (INDEPENDENT_AMBULATORY_CARE_PROVIDER_SITE_OTHER): Payer: 59 | Admitting: Emergency Medicine

## 2023-06-22 VITALS — BP 128/70 | HR 77 | Temp 98.0°F | Ht 67.0 in | Wt 170.6 lb

## 2023-06-22 DIAGNOSIS — J449 Chronic obstructive pulmonary disease, unspecified: Secondary | ICD-10-CM

## 2023-06-22 DIAGNOSIS — R918 Other nonspecific abnormal finding of lung field: Secondary | ICD-10-CM

## 2023-06-22 DIAGNOSIS — E785 Hyperlipidemia, unspecified: Secondary | ICD-10-CM | POA: Diagnosis not present

## 2023-06-22 DIAGNOSIS — I130 Hypertensive heart and chronic kidney disease with heart failure and stage 1 through stage 4 chronic kidney disease, or unspecified chronic kidney disease: Secondary | ICD-10-CM | POA: Diagnosis not present

## 2023-06-22 DIAGNOSIS — R9389 Abnormal findings on diagnostic imaging of other specified body structures: Secondary | ICD-10-CM | POA: Diagnosis not present

## 2023-06-22 DIAGNOSIS — I5042 Chronic combined systolic (congestive) and diastolic (congestive) heart failure: Secondary | ICD-10-CM | POA: Diagnosis not present

## 2023-06-22 NOTE — Assessment & Plan Note (Signed)
The lingular process has progressed, now also with the posterior left upper lobe mass lesion.  Unfortunately he was lost to follow-up, did not have his CT chest after I last saw him.  Consistent with malignancy.  He needs navigational bronchoscopy and also endobronchial ultrasound for mediastinal staging.  I discussed with him today, he agrees and we will work on getting this set up as soon as possible.  We reviewed your CT scan of the chest today. We will arrange for robotic assisted navigational bronchoscopy.  This will be done under general anesthesia as an outpatient at [facility] endoscopy.  You will need a designated driver.  You will need someone to watch you at home on that day after the procedure.  We will stop your Eliquis 2 days prior to the procedure.  We will try to get this scheduled for either 07/05/2023 or 07/11/2023.   Follow with Dr. Delton Coombes or T. Parrett about 1 week after your procedure

## 2023-06-22 NOTE — H&P (View-Only) (Signed)
 Subjective:    Patient ID: [name], male    DOB: 02-Jan-1941, 82 y.o.   MRN: [id number]  HPI 82 year old former smoker with CAD and a nonischemic cardiomyopathy, hypertension, peripheral vascular disease, blindness, chronic kidney disease.  He is referred today for dyspnea and for evaluation of abnormal Ct chest.  He suffered a trauma in January, CT chest abdomen pelvis performed, suffered rib fractures left eighth and ninth.  Currently managed on Breo for presumed COPD. No pulmonary function testing  CT chest from 12/19/2019 showed areas of atelectatic change and/or infiltrate in the bilateral upper lobes in the posterior aspect lower lobes, more prominent with an almost nodular appearance along the inferior aspect of left upper lobe.  There is no pneumothorax.  Mild lymphadenopathy in the AP window  ROV 06/22/2023 --follow-up visit for 82 year old gentleman, former smoker with a history of CAD with a nonischemic cardiomyopathy, hypertension, PVD, CKD.  I saw him in 2021 for presumed COPD, abnormal CT scan of the chest.  Had been managed on Breo, currently.  He had an abnormal CT scan of the chest with some groundglass change in the lingula.  He has been having cough and chest discomfort, prompted a CT chest abdomen and pelvis 06/02/2023 that I have reviewed as below.  He has seen Dr. Arbutus Ped on 7/23  CT scan of the chest abdomen and pelvis 06/02/2023 reviewed by me shows a 2.7 x 2.4 cm solid spiculated posterior left upper lobe mass with adjacent groundglass suspicious for malignancy.  There is also an increase in size of an irregular nodular density in the lingula 4.6 cm.  He also has mildly enlarged mediastinal lymph nodes concerning for possible metastasis.  There is a 1.5 cm hyperenhancing nodule in the superior aspect of the spleen.   Review of Systems As per HPI  Past Medical History:  Diagnosis Date   AICD (automatic cardioverter/defibrillator) present    CAD (coronary artery  disease) 80% stenosis diag of the LAD, 30% in OM2 branch of LCX in 2009    a. Nonobstructive CAD by cath 11/2011 with the exception of the pre-existing diagonal branch #2 lesion.   Chronic systolic CHF (congestive heart failure) (HCC)    CKD (chronic kidney disease) stage 3, GFR 30-59 ml/min (HCC)    Colon polyp, hyperplastic    History of stress test 06/01/2012   Normal myocardial perfusion study. compared to the previous study there is no significant change. this is a low risk scan   Hypertension    Legally blind    "both eyes"   Myocardial infarction [facility]) 11/22/2011   NICM (nonischemic cardiomyopathy) (HCC)    a. Remote hx of dilated NICM with EF ranging 20-45%, including normal EF by echo (55-60%) in 2014.   Peripheral arterial disease (HCC)    a. 06/2014: ABI right 0.99, left 1.2, LE dopplers revealing an occluded right posterior tibial. As symptoms were not felt r/t claudication, no further w/u at the time.   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 Requiring discontinuation of BB dose.   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;  stopped drinking in the early 1980's"   Drug use: No   Sexual activity: Yes  Other Topics Concern   Not on file  Social History Narrative   Not on file   Social Determinants of Health   Financial Resource Strain: Not on file  Food Insecurity: Unknown (04/30/2023)   Hunger Vital Sign    Worried About Running Out of Food in the Last Year: Patient declined    Ran Out of Food in the Last Year: Never true  Transportation Needs: No Transportation Needs (04/30/2023)   PRAPARE - Administrator, Civil Service (Medical): No    Lack of Transportation (Non-Medical): No  Physical Activity: Not on file  Stress: Not on file  Social Connections: Not on file  Intimate Partner Violence: Unknown (04/30/2023)   Humiliation, Afraid, Rape, and Kick questionnaire    Fear of Current or Ex-Partner: No    Emotionally Abused: No    Physically Abused: Not on file    Sexually Abused: No     No Known Allergies   Outpatient Medications Prior to Visit  Medication Sig Dispense Refill   albuterol (VENTOLIN HFA) 108 (90 Base) MCG/ACT inhaler Inhale 1-2 puffs into the lungs every 6 (six) hours as needed for wheezing or shortness of breath.     apixaban (ELIQUIS) 2.5 MG TABS tablet Take 1 tablet (2.5 mg total) by mouth 2 (two) times daily. 180 tablet 3   BREO ELLIPTA 100-25 MCG/INH AEPB Inhale 1 puff into the lungs every morning.     brimonidine (ALPHAGAN) 0.2 % ophthalmic solution Place 1 drop into both eyes 2 (two) times daily.     cycloSPORINE (RESTASIS) 0.05 % ophthalmic emulsion Place 1 drop into both eyes 2 (two) times daily.     diclofenac Sodium (VOLTAREN) 1 % GEL Apply 2 g topically 4 (four) times daily. 100 g 0   dorzolamide-timolol (COSOPT) 22.3-6.8 MG/ML ophthalmic solution Place 1 drop into both eyes 2 (two) times daily.     fexofenadine (ALLEGRA) 180 MG tablet Take 180 mg by mouth daily as needed for allergies or rhinitis.     furosemide (LASIX) 20 MG tablet TAKE 1 TABLET(20 MG) BY MOUTH  DAILY (Patient taking differently: Take 20 mg by mouth See admin instructions. Take 20 mg by mouth EVERY OTHER MORNING) 90 tablet 3   isosorbide mononitrate (IMDUR) 30 MG 24 hr tablet Take 30 mg by mouth daily.     metoprolol succinate (TOPROL-XL) 100 MG 24 hr tablet TAKE 1 TABLET(100 MG) BY MOUTH DAILY WITH OR IMMEDIATELY FOLLOWING A MEAL (Patient taking differently: Take 100 mg by mouth in the morning.) 90 tablet 3   nitroGLYCERIN (NITROSTAT) 0.4 MG SL tablet Place 1 tablet (0.4 mg total) under the tongue every 5 (five) minutes as needed for chest pain.  25 tablet 1   ROCKLATAN 0.02-0.005 % SOLN Apply 1 drop to eye at bedtime.     sacubitril-valsartan (ENTRESTO) 24-26 MG Take 1 tablet by mouth 2 (two) times daily. 60 tablet 11   spironolactone (ALDACTONE) 25 MG tablet Take 12.5 mg by mouth daily.     No facility-administered medications prior to visit.         Objective:   Physical Exam Vitals:   06/22/23 0905  BP: 128/70  Pulse: 77  Temp: 98 F (36.7 C)  TempSrc: Oral  SpO2: 97%  Weight: 170 lb 9.6 oz (77.4 kg)  Height: 5\' 7"  (1.702 m)   Gen: Pleasant, well-nourished, in no distress,  normal affect  ENT: No lesions,  mouth clear,  oropharynx clear, no postnasal drip  Neck: No JVD, no stridor  Lungs: No use of accessory muscles, distant, no crackles or wheezing on normal respiration, no wheeze on forced expiration  Cardiovascular: RRR, heart sounds normal, no murmur or gallops, no peripheral edema  Musculoskeletal: No deformities, no cyanosis or clubbing  Neuro: alert, awake, non focal  Skin: Warm, no lesions or rash     Assessment & Plan:  Abnormal CT of the chest The lingular process has progressed, now also with the posterior left upper lobe mass lesion.  Unfortunately he was lost to follow-up, did not have his CT chest after I last saw him.  Consistent with malignancy.  He needs navigational bronchoscopy and also endobronchial ultrasound for mediastinal staging.  I  discussed with him today, he agrees and we will work on getting this set up as soon as possible.  We reviewed your CT scan of the chest today. We will arrange for robotic assisted navigational bronchoscopy.  This will be done under general anesthesia as an outpatient at [facility] endoscopy.  You will need a designated driver.  You will need someone to watch you at home on that day after the procedure.  We will stop your Eliquis 2 days prior to the procedure.  We will try to get this scheduled for either 07/05/2023 or 07/11/2023.   Follow with Dr. Delton Coombes or T. Parrett about 1 week after your procedure  COPD (chronic obstructive pulmonary disease) (HCC) Please continue Breo as you have been taking it  Levy Pupa, MD, PhD 06/22/2023, 9:43 AM [facility] [phone] or if no answer [phone]

## 2023-06-22 NOTE — Assessment & Plan Note (Signed)
Please continue Breo as you have been taking it

## 2023-06-22 NOTE — Progress Notes (Signed)
Subjective:    Patient ID: [name], male    DOB: 02-Jan-1941, 82 y.o.   MRN: [id number]  HPI 82 year old former smoker with CAD and a nonischemic cardiomyopathy, hypertension, peripheral vascular disease, blindness, chronic kidney disease.  He is referred today for dyspnea and for evaluation of abnormal Ct chest.  He suffered a trauma in January, CT chest abdomen pelvis performed, suffered rib fractures left eighth and ninth.  Currently managed on Breo for presumed COPD. No pulmonary function testing  CT chest from 12/19/2019 showed areas of atelectatic change and/or infiltrate in the bilateral upper lobes in the posterior aspect lower lobes, more prominent with an almost nodular appearance along the inferior aspect of left upper lobe.  There is no pneumothorax.  Mild lymphadenopathy in the AP window  ROV 06/22/2023 --follow-up visit for 82 year old gentleman, former smoker with a history of CAD with a nonischemic cardiomyopathy, hypertension, PVD, CKD.  I saw him in 2021 for presumed COPD, abnormal CT scan of the chest.  Had been managed on Breo, currently.  He had an abnormal CT scan of the chest with some groundglass change in the lingula.  He has been having cough and chest discomfort, prompted a CT chest abdomen and pelvis 06/02/2023 that I have reviewed as below.  He has seen Dr. Arbutus Ped on 7/23  CT scan of the chest abdomen and pelvis 06/02/2023 reviewed by me shows a 2.7 x 2.4 cm solid spiculated posterior left upper lobe mass with adjacent groundglass suspicious for malignancy.  There is also an increase in size of an irregular nodular density in the lingula 4.6 cm.  He also has mildly enlarged mediastinal lymph nodes concerning for possible metastasis.  There is a 1.5 cm hyperenhancing nodule in the superior aspect of the spleen.   Review of Systems As per HPI  Past Medical History:  Diagnosis Date   AICD (automatic cardioverter/defibrillator) present    CAD (coronary artery  disease) 80% stenosis diag of the LAD, 30% in OM2 branch of LCX in 2009    a. Nonobstructive CAD by cath 11/2011 with the exception of the pre-existing diagonal branch #2 lesion.   Chronic systolic CHF (congestive heart failure) (HCC)    CKD (chronic kidney disease) stage 3, GFR 30-59 ml/min (HCC)    Colon polyp, hyperplastic    History of stress test 06/01/2012   Normal myocardial perfusion study. compared to the previous study there is no significant change. this is a low risk scan   Hypertension    Legally blind    "both eyes"   Myocardial infarction [facility]) 11/22/2011   NICM (nonischemic cardiomyopathy) (HCC)    a. Remote hx of dilated NICM with EF ranging 20-45%, including normal EF by echo (55-60%) in 2014.   Peripheral arterial disease (HCC)    a. 06/2014: ABI right 0.99, left 1.2, LE dopplers revealing an occluded right posterior tibial. As symptoms were not felt r/t claudication, no further w/u at the time.   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 Requiring discontinuation of BB dose.   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;  stopped drinking in the early 1980's"   Drug use: No   Sexual activity: Yes  Other Topics Concern   Not on file  Social History Narrative   Not on file   Social Determinants of Health   Financial Resource Strain: Not on file  Food Insecurity: Unknown (04/30/2023)   Hunger Vital Sign    Worried About Running Out of Food in the Last Year: Patient declined    Ran Out of Food in the Last Year: Never true  Transportation Needs: No Transportation Needs (04/30/2023)   PRAPARE - Administrator, Civil Service (Medical): No    Lack of Transportation (Non-Medical): No  Physical Activity: Not on file  Stress: Not on file  Social Connections: Not on file  Intimate Partner Violence: Unknown (04/30/2023)   Humiliation, Afraid, Rape, and Kick questionnaire    Fear of Current or Ex-Partner: No    Emotionally Abused: No    Physically Abused: Not on file    Sexually Abused: No     No Known Allergies   Outpatient Medications Prior to Visit  Medication Sig Dispense Refill   albuterol (VENTOLIN HFA) 108 (90 Base) MCG/ACT inhaler Inhale 1-2 puffs into the lungs every 6 (six) hours as needed for wheezing or shortness of breath.     apixaban (ELIQUIS) 2.5 MG TABS tablet Take 1 tablet (2.5 mg total) by mouth 2 (two) times daily. 180 tablet 3   BREO ELLIPTA 100-25 MCG/INH AEPB Inhale 1 puff into the lungs every morning.     brimonidine (ALPHAGAN) 0.2 % ophthalmic solution Place 1 drop into both eyes 2 (two) times daily.     cycloSPORINE (RESTASIS) 0.05 % ophthalmic emulsion Place 1 drop into both eyes 2 (two) times daily.     diclofenac Sodium (VOLTAREN) 1 % GEL Apply 2 g topically 4 (four) times daily. 100 g 0   dorzolamide-timolol (COSOPT) 22.3-6.8 MG/ML ophthalmic solution Place 1 drop into both eyes 2 (two) times daily.     fexofenadine (ALLEGRA) 180 MG tablet Take 180 mg by mouth daily as needed for allergies or rhinitis.     furosemide (LASIX) 20 MG tablet TAKE 1 TABLET(20 MG) BY MOUTH  DAILY (Patient taking differently: Take 20 mg by mouth See admin instructions. Take 20 mg by mouth EVERY OTHER MORNING) 90 tablet 3   isosorbide mononitrate (IMDUR) 30 MG 24 hr tablet Take 30 mg by mouth daily.     metoprolol succinate (TOPROL-XL) 100 MG 24 hr tablet TAKE 1 TABLET(100 MG) BY MOUTH DAILY WITH OR IMMEDIATELY FOLLOWING A MEAL (Patient taking differently: Take 100 mg by mouth in the morning.) 90 tablet 3   nitroGLYCERIN (NITROSTAT) 0.4 MG SL tablet Place 1 tablet (0.4 mg total) under the tongue every 5 (five) minutes as needed for chest pain.  25 tablet 1   ROCKLATAN 0.02-0.005 % SOLN Apply 1 drop to eye at bedtime.     sacubitril-valsartan (ENTRESTO) 24-26 MG Take 1 tablet by mouth 2 (two) times daily. 60 tablet 11   spironolactone (ALDACTONE) 25 MG tablet Take 12.5 mg by mouth daily.     No facility-administered medications prior to visit.         Objective:   Physical Exam Vitals:   06/22/23 0905  BP: 128/70  Pulse: 77  Temp: 98 F (36.7 C)  TempSrc: Oral  SpO2: 97%  Weight: 170 lb 9.6 oz (77.4 kg)  Height: 5\' 7"  (1.702 m)   Gen: Pleasant, well-nourished, in no distress,  normal affect  ENT: No lesions,  mouth clear,  oropharynx clear, no postnasal drip  Neck: No JVD, no stridor  Lungs: No use of accessory muscles, distant, no crackles or wheezing on normal respiration, no wheeze on forced expiration  Cardiovascular: RRR, heart sounds normal, no murmur or gallops, no peripheral edema  Musculoskeletal: No deformities, no cyanosis or clubbing  Neuro: alert, awake, non focal  Skin: Warm, no lesions or rash     Assessment & Plan:  Abnormal CT of the chest The lingular process has progressed, now also with the posterior left upper lobe mass lesion.  Unfortunately he was lost to follow-up, did not have his CT chest after I last saw him.  Consistent with malignancy.  He needs navigational bronchoscopy and also endobronchial ultrasound for mediastinal staging.  I  discussed with him today, he agrees and we will work on getting this set up as soon as possible.  We reviewed your CT scan of the chest today. We will arrange for robotic assisted navigational bronchoscopy.  This will be done under general anesthesia as an outpatient at [facility] endoscopy.  You will need a designated driver.  You will need someone to watch you at home on that day after the procedure.  We will stop your Eliquis 2 days prior to the procedure.  We will try to get this scheduled for either 07/05/2023 or 07/11/2023.   Follow with Dr. Delton Coombes or T. Parrett about 1 week after your procedure  COPD (chronic obstructive pulmonary disease) (HCC) Please continue Breo as you have been taking it  Levy Pupa, MD, PhD 06/22/2023, 9:43 AM [facility] [phone] or if no answer [phone]

## 2023-06-22 NOTE — Patient Instructions (Signed)
We reviewed your CT scan of the chest today. We will arrange for robotic assisted navigational bronchoscopy.  This will be done under general anesthesia as an outpatient at [facility] endoscopy.  You will need a designated driver.  You will need someone to watch you at home on that day after the procedure.  We will stop your Eliquis 2 days prior to the procedure.  We will try to get this scheduled for either 07/05/2023 or 07/11/2023. Please continue Breo as you have been taking it Follow with Dr. Delton Coombes or T. Parrett about 1 week after your procedure

## 2023-06-23 ENCOUNTER — Other Ambulatory Visit: Payer: Self-pay

## 2023-06-23 ENCOUNTER — Encounter: Payer: Self-pay | Admitting: Podiatry

## 2023-06-23 ENCOUNTER — Ambulatory Visit (INDEPENDENT_AMBULATORY_CARE_PROVIDER_SITE_OTHER): Payer: 59 | Admitting: Podiatry

## 2023-06-23 DIAGNOSIS — N1831 Chronic kidney disease, stage 3a: Secondary | ICD-10-CM

## 2023-06-23 DIAGNOSIS — M79674 Pain in right toe(s): Secondary | ICD-10-CM

## 2023-06-23 DIAGNOSIS — D689 Coagulation defect, unspecified: Secondary | ICD-10-CM | POA: Diagnosis not present

## 2023-06-23 DIAGNOSIS — R918 Other nonspecific abnormal finding of lung field: Secondary | ICD-10-CM

## 2023-06-23 DIAGNOSIS — M79675 Pain in left toe(s): Secondary | ICD-10-CM

## 2023-06-23 DIAGNOSIS — B351 Tinea unguium: Secondary | ICD-10-CM | POA: Diagnosis not present

## 2023-06-23 NOTE — Progress Notes (Signed)
This patient returns to my office for at risk foot care.  This patient requires this care by a professional since this patient will be at risk due to having , PAD and  Chronic kidney disease.  .  This patient is unable to cut nails himself since the patient cannot reach his nails.These nails are painful walking and wearing shoes.  This patient presents for at risk foot care today.  General Appearance  Alert, conversant and in no acute stress.  Vascular  Dorsalis pedis and posterior tibial  pulses are palpable  bilaterally.  Capillary return is within normal limits  bilaterally. Temperature is within normal limits  bilaterally.  Neurologic  Senn-Weinstein monofilament wire test within normal limits  bilaterally. Muscle power within normal limits bilaterally.  Nails Thick disfigured discolored nails with subungual debris  from hallux to fifth toes bilaterally. No evidence of bacterial infection or drainage bilaterally.  Orthopedic  No limitations of motion  feet .  No crepitus or effusions noted.  No bony pathology or digital deformities noted.  HAV  B/L.  Skin  normotropic skin with no porokeratosis noted bilaterally.  No signs of infections or ulcers noted.     Onychomycosis  Pain in right toes  Pain in left toes  Consent was obtained for treatment procedures.   Mechanical debridement of nails 1-5  bilaterally performed with a nail nipper.  Filed with dremel without incident.    Return office visit   3 months                   Told patient to return for periodic foot care and evaluation due to potential at risk complications.   Gregory Mayer DPM  

## 2023-06-24 ENCOUNTER — Ambulatory Visit (HOSPITAL_COMMUNITY)
Admission: RE | Admit: 2023-06-24 | Discharge: 2023-06-24 | Disposition: A | Discharge status: Home / Self Care | Payer: 59 | Source: Ambulatory Visit | Attending: Internal Medicine | Admitting: Internal Medicine

## 2023-06-24 ENCOUNTER — Encounter (HOSPITAL_COMMUNITY): Payer: Self-pay

## 2023-06-24 DIAGNOSIS — C3490 Malignant neoplasm of unspecified part of unspecified bronchus or lung: Secondary | ICD-10-CM | POA: Diagnosis not present

## 2023-06-24 DIAGNOSIS — J432 Centrilobular emphysema: Secondary | ICD-10-CM | POA: Diagnosis not present

## 2023-06-24 DIAGNOSIS — R918 Other nonspecific abnormal finding of lung field: Secondary | ICD-10-CM | POA: Insufficient documentation

## 2023-06-24 DIAGNOSIS — R220 Localized swelling, mass and lump, head: Secondary | ICD-10-CM | POA: Diagnosis not present

## 2023-06-24 MED ORDER — SODIUM CHLORIDE (PF) 0.9 % IJ SOLN
INTRAMUSCULAR | Status: AC
  Filled 2023-06-24: qty 50

## 2023-06-24 MED ORDER — IOHEXOL 300 MG/ML  SOLN
75.0000 mL | Freq: Once | INTRAMUSCULAR | Status: AC | PRN
  Administered 2023-06-24: 75 mL via INTRAVENOUS

## 2023-06-27 DIAGNOSIS — I10 Essential (primary) hypertension: Secondary | ICD-10-CM | POA: Diagnosis not present

## 2023-06-27 DIAGNOSIS — I5042 Chronic combined systolic (congestive) and diastolic (congestive) heart failure: Secondary | ICD-10-CM | POA: Diagnosis not present

## 2023-06-27 DIAGNOSIS — I130 Hypertensive heart and chronic kidney disease with heart failure and stage 1 through stage 4 chronic kidney disease, or unspecified chronic kidney disease: Secondary | ICD-10-CM | POA: Diagnosis not present

## 2023-06-27 DIAGNOSIS — R911 Solitary pulmonary nodule: Secondary | ICD-10-CM | POA: Diagnosis not present

## 2023-06-29 ENCOUNTER — Telehealth: Payer: Self-pay

## 2023-06-29 NOTE — Telephone Encounter (Signed)
This nurse reached out to patient, advised that his PET is scheduled for 07/01/2023 and his office visit is scheduled for 06/30/23.  Provider would like to discuss results of scan at office visit.  This nurse offered to reschedule appointment to 8/14 at 9am.  Patient is in agreement and appointment has been rescheduled.  No further questions or concerns noted at this time.

## 2023-06-30 ENCOUNTER — Encounter: Payer: Self-pay | Admitting: Cardiovascular Disease

## 2023-06-30 ENCOUNTER — Inpatient Hospital Stay: Payer: 59 | Admitting: Physician Assistant

## 2023-06-30 NOTE — Progress Notes (Signed)
PERIOPERATIVE PRESCRIPTION FOR IMPLANTED CARDIAC DEVICE PROGRAMMING  Patient Information: Name:  [name]  DOB:  07-18-1941  MRN:  [id number]    Planned Procedure:  Robotic Assisted Navigational bronchoscopy, endobronchial ultrasound  Surgeon:  Dr. Levy Pupa  Date of Procedure:  07/05/23  Cautery will be used.  Position during surgery:  supine   Please send documentation back to:  [facility] (Fax # [phone])  Device Information:  Clinic EP Physician:  Dr. Melanee Spry Croitoru   Device Type:  Defibrillator Manufacturer and Phone #:  [employer]: [phone] Pacemaker Dependent?:  Unknown Date of Last Device Check:  04/05/23 Normal Device Function?:  Yes.    Electrophysiologist's Recommendations:  Have magnet available. Provide continuous ECG monitoring when magnet is used or reprogramming is to be performed.  Procedure may interfere with device function.  Magnet should be placed over device during procedure.  Per Device Clinic Standing Orders, Lenor Coffin, RN  1:03 PM 06/30/2023

## 2023-07-01 ENCOUNTER — Encounter (HOSPITAL_COMMUNITY): Payer: Self-pay | Admitting: Emergency Medicine

## 2023-07-01 ENCOUNTER — Other Ambulatory Visit: Payer: Self-pay

## 2023-07-01 ENCOUNTER — Ambulatory Visit (HOSPITAL_COMMUNITY): Admission: RE | Admit: 2023-07-01 | Payer: 59 | Source: Ambulatory Visit

## 2023-07-01 DIAGNOSIS — C3490 Malignant neoplasm of unspecified part of unspecified bronchus or lung: Secondary | ICD-10-CM | POA: Insufficient documentation

## 2023-07-01 DIAGNOSIS — R918 Other nonspecific abnormal finding of lung field: Secondary | ICD-10-CM | POA: Diagnosis not present

## 2023-07-01 DIAGNOSIS — R591 Generalized enlarged lymph nodes: Secondary | ICD-10-CM | POA: Diagnosis not present

## 2023-07-01 DIAGNOSIS — N281 Cyst of kidney, acquired: Secondary | ICD-10-CM | POA: Diagnosis not present

## 2023-07-01 DIAGNOSIS — I2510 Atherosclerotic heart disease of native coronary artery without angina pectoris: Secondary | ICD-10-CM | POA: Diagnosis not present

## 2023-07-01 LAB — GLUCOSE, CAPILLARY: Glucose-Capillary: 100 mg/dL — ABNORMAL HIGH (ref 70–99)

## 2023-07-01 MED ORDER — FLUDEOXYGLUCOSE F - 18 (FDG) INJECTION
8.4900 | Freq: Once | INTRAVENOUS | Status: AC
  Administered 2023-07-01: 8.49 via INTRAVENOUS

## 2023-07-01 NOTE — Progress Notes (Signed)
Spoke with pt's wife, Elonda Husky for pre-op call. DPR on file. Pt has an extensive cardiac his with CAD, Vtach, A-fib and has a Pacemaker. Pt's cardiologists are Dr. Royann Shivers and Dr. Tresa Endo. Device orders are in patient's chart. Spoke with Arlys John, rep for [employer]. Orders stated magnet should be used. He took the info and said he doesn't need to be here.  Eliquis last dose is to be tomorrow 07/02/23. Instructed to hold 2 days prior to procedure Shower instructions given to [facility] and she voiced understanding.

## 2023-07-01 NOTE — Anesthesia Preprocedure Evaluation (Signed)
Anesthesia Evaluation  Patient identified by MRN, date of birth, ID band Patient awake    Reviewed: Allergy & Precautions, H&P , NPO status , Patient's Chart, lab work & pertinent test results  Airway Mallampati: II   Neck ROM: full    Dental   Pulmonary COPD, former smoker   breath sounds clear to auscultation       Cardiovascular hypertension, + CAD, + Past MI, + Peripheral Vascular Disease and +CHF  + dysrhythmias + pacemaker + Cardiac Defibrillator  Rhythm:regular Rate:Normal     Neuro/Psych    GI/Hepatic ,GERD  ,,  Endo/Other    Renal/GU      Musculoskeletal   Abdominal   Peds  Hematology   Anesthesia Other Findings   Reproductive/Obstetrics                              Anesthesia Physical Anesthesia Plan  ASA: 3  Anesthesia Plan: General   Post-op Pain Management:    Induction: Intravenous  PONV Risk Score and Plan: 2 and Ondansetron, Dexamethasone and Treatment may vary due to age or medical condition  Airway Management Planned: Oral ETT  Additional Equipment:   Intra-op Plan:   Post-operative Plan: Extubation in OR  Informed Consent: I have reviewed the patients History and Physical, chart, labs and discussed the procedure including the risks, benefits and alternatives for the proposed anesthesia with the patient or authorized representative who has indicated his/her understanding and acceptance.     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  presented to the ED on 04/29/2023 with complaints of  chest pain beginning the prior morning while walking and improvement after NTG.  EKG without ischemic changes.  Troponins flat. Echo showed EF 35-40%, grade I DD and unchanged from 2023.  Evaluated by cardiology and found to be tender to palpation right and left chest wall.  Symptoms were not felt to be due to pericarditis.  Suspect costochondritis.  Given 5 days of prednisone.  Last seen by Carlos Levering, NP 05/06/2023.  He was noted to be doing well at that time, no chest pain, reported feeling great since his course of prednisone.  No changes made to management, 1 year follow-up recommended.  Former smoker with associated COPD with recent abnormal CT scan  He is currently managed on Breo.  Seen by Dr. Delton Coombes 06/22/2023 to review recent imaging.  Per note, "CT scan of the chest abdomen and pelvis 06/02/2023 reviewed by me shows a 2.7 x 2.4 cm solid spiculated posterior left upper lobe mass with adjacent groundglass suspicious for malignancy. There is also an increase in size of an irregular nodular density in the lingula 4.6 cm. He also has mildly enlarged mediastinal lymph nodes concerning for possible metastasis. There is a 1.5 cm hyperenhancing nodule in the superior aspect of the spleen."  Bronchoscopy recommended.  He was instructed by Dr. Idelle Leech to hold Eliquis 2 days prior to procedure.  History of CKD, baseline creatinine appears to be ~1.5.  CMP and CBC 06/14/2023 reviewed, creatinine mildly elevated 1.50, mild thrombocytopenia with platelets 136, otherwise unremarkable.  EKG 06/02/2023: V paced rhythm.  Rate  100.  Perioperative prescription for implanted cardiac device programming for progress note 06/30/2023: Device Information:   Clinic EP Physician:  Dr. Melanee Spry Croitoru    Device Type:  Defibrillator Manufacturer and Phone #:  [employer]: [phone] Pacemaker Dependent?:  Unknown Date of Last Device Check:  04/05/23           Normal Device Function?:  Yes.      Electrophysiologist's Recommendations:    Have magnet available.  Provide continuous ECG monitoring when magnet is used or reprogramming is to be performed.   Procedure may interfere with device function.  Magnet should be placed over device during procedure.  CT super D chest 06/24/2023: IMPRESSION: 1. Subsolid 4.7 cm posterior apical left upper lobe lung mass with 2.2 cm solid component, unchanged from recent 06/02/2023 CT and increased from 4.2 cm with no measurable solid component on 09/15/2020 chest CT, most compatible with primary bronchogenic adenocarcinoma. 2. 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 3. Solid 0.6 cm anterior left lower lobe pulmonary nodule, new from 09/15/2020 CT and unchanged from recent 06/02/2023 CT, cannot exclude ipsilateral pulmonary metastasis. 4. Mild bilateral mediastinal lymphadenopathy, nonspecific, metastatic adenopathy not excluded. 5. Three-vessel coronary atherosclerosis. 6. Nonobstructing bilateral nephrolithiasis. 7. Aortic Atherosclerosis (ICD10-I70.0) and Emphysema (ICD10-J43.9).    TTE 04/30/2023:  1. Left ventricular ejection fraction, by estimation, is 35 to 40%. The  left ventricle has moderately decreased function. The left ventricle  demonstrates regional wall motion abnormalities (see scoring  diagram/findings for description). Left ventricular   diastolic parameters are consistent with Grade I diastolic dysfunction  (impaired relaxation).   2. Right ventricular systolic function is normal. The right ventricular  size is normal. There is normal pulmonary artery systolic pressure. The  estimated right ventricular systolic pressure is 21.0 mmHg.   3. Left atrial size was mild to moderately dilated.   4. The mitral valve is grossly normal. Trivial mitral valve   regurgitation.   5. The aortic valve is tricuspid. There is mild calcification of the  aortic valve. Aortic valve regurgitation is not visualized. No aortic  stenosis is present.   6. The inferior vena cava is normal in size with greater than 50%  respiratory variability, suggesting right atrial pressure of 3 mmHg.   Comparison(s): Prior images reviewed side by side. LVEF stable in 35-40%  range.   Cath 11/07/2019:  Prox LAD to Mid LAD lesion is 30% stenosed.  1st Diag lesion is 80% stenosed.   1.  Stable 80% stenosis in diagonal branch which was described on previous cardiac catheterization in 2009.  No other obstructive disease.   2.  Normal left ventricular end-diastolic pressure.  Left ventricular angiography was not performed due to chronic kidney disease.   Recommendations: No culprit is identified for ventricular tachycardia. Proceed with EP management as planned.   )         Anesthesia Quick Evaluation

## 2023-07-01 NOTE — Progress Notes (Signed)
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  presented to the ED on 04/29/2023 with complaints of chest pain beginning the prior morning while walking and improvement after NTG.  EKG without ischemic changes.  Troponins flat. Echo showed EF 35-40%, grade I DD and unchanged from 2023.  Evaluated by cardiology and found to be tender to palpation right and left chest wall.  Symptoms were not felt to be due to pericarditis.  Suspect costochondritis.  Given 5 days of prednisone.  Last seen by Rodney Levering, NP 05/06/2023.  He was noted to be doing well at that time, no chest pain, reported feeling great since his course of prednisone.  No changes made to management, 1 year follow-up recommended.  Former smoker with associated COPD with recent abnormal CT scan  He is currently managed on Breo.  Seen by Dr. Delton Coombes 06/22/2023 to review recent imaging.  Per note, "CT scan of the chest abdomen and pelvis 06/02/2023 reviewed by me shows a 2.7 x 2.4 cm solid spiculated posterior left upper lobe mass with adjacent groundglass suspicious for malignancy. There is also an increase in size of an irregular nodular density in the lingula 4.6 cm. He also has mildly enlarged mediastinal lymph nodes concerning for possible metastasis. There is a 1.5 cm hyperenhancing nodule in the superior aspect of the spleen."  Bronchoscopy recommended.  He was instructed by Dr. Idelle Leech to hold Eliquis 2 days prior to procedure.  History of CKD, baseline creatinine appears to be ~1.5.  CMP and CBC 06/14/2023 reviewed, creatinine mildly elevated 1.50, mild thrombocytopenia with platelets 136, otherwise unremarkable.  EKG 06/02/2023: V paced rhythm.  Rate  100.  Perioperative prescription for implanted cardiac device programming for progress note 06/30/2023: Device Information:   Clinic EP Physician:  Dr. Melanee Spry Croitoru    Device Type:  Defibrillator Manufacturer and Phone #:  [employer]: [phone] Pacemaker Dependent?:  Unknown Date of Last Device Check:  04/05/23           Normal Device Function?:  Yes.     Electrophysiologist's Recommendations:   Have magnet available. Provide continuous ECG monitoring when magnet is used or reprogramming is to be performed.  Procedure may interfere with device function.  Magnet should be placed over device during procedure.  CT super D chest 06/24/2023: IMPRESSION: 1. Subsolid 4.7 cm posterior apical left upper lobe lung mass with 2.2 cm solid component, unchanged from recent 06/02/2023 CT and increased from 4.2 cm with no measurable solid component on 09/15/2020 chest CT, most compatible with primary bronchogenic adenocarcinoma. 2. 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 3. Solid 0.6 cm anterior left lower lobe pulmonary nodule, new from 09/15/2020 CT and unchanged from recent 06/02/2023 CT, cannot exclude ipsilateral pulmonary metastasis. 4. Mild bilateral mediastinal lymphadenopathy, nonspecific, metastatic adenopathy not excluded. 5. Three-vessel coronary atherosclerosis. 6. Nonobstructing bilateral nephrolithiasis. 7. Aortic Atherosclerosis (ICD10-I70.0) and Emphysema (ICD10-J43.9).    TTE 04/30/2023:  1. Left ventricular ejection fraction, by estimation, is 35 to 40%. The  left ventricle has moderately decreased function. The left ventricle  demonstrates regional wall motion abnormalities (see scoring  diagram/findings for description). Left ventricular   diastolic  parameters are consistent with Grade I diastolic  dysfunction  (impaired relaxation).   2. Right ventricular systolic function is normal. The right ventricular  size is normal. There is normal pulmonary artery systolic pressure. The  estimated right ventricular systolic pressure is 21.0 mmHg.   3. Left atrial size was mild to moderately dilated.   4. The mitral valve is grossly normal. Trivial mitral valve  regurgitation.   5. The aortic valve is tricuspid. There is mild calcification of the  aortic valve. Aortic valve regurgitation is not visualized. No aortic  stenosis is present.   6. The inferior vena cava is normal in size with greater than 50%  respiratory variability, suggesting right atrial pressure of 3 mmHg.   Comparison(s): Prior images reviewed side by side. LVEF stable in 35-40%  range.   Cath 11/07/2019: Prox LAD to Mid LAD lesion is 30% stenosed. 1st Diag lesion is 80% stenosed.   1.  Stable 80% stenosis in diagonal branch which was described on previous cardiac catheterization in 2009.  No other obstructive disease.   2.  Normal left ventricular end-diastolic pressure.  Left ventricular angiography was not performed due to chronic kidney disease.   Recommendations: No culprit is identified for ventricular tachycardia. Proceed with EP management as planned.    [name] [facility]/Anesthesiology Phone [phone] 07/01/2023 12:09 PM

## 2023-07-03 NOTE — Progress Notes (Unsigned)
[facility] OFFICE PROGRESS NOTE  [name], MD [address] [place] [address]  DIAGNOSIS: highly suspicious primary bronchogenic carcinoma presented with right upper lobe spiculated mass in addition to lingular lesion and suspicious mediastinal lymphadenopathy pending further staging work-up  ***  PRIOR THERAPY: None  CURRENT THERAPY: ***  INTERVAL HISTORY: ALICK FOLKS 82 y.o. male returns to the clinic today for follow-up visit accompanied by ***.  The patient establish care with Dr. Arbutus Ped in the clinic on 06/14/2023.  The patient need to complete the staging workup with a PET scan and imaging of the brain.  He also required a biopsy.  He had his PET scan which showed **.  He is unable to have brain MRIs due to ICD placement and had a CT of the head which showed ***.  Do see a neurologist?  He also underwent bronchoscopy and biopsy by Dr. Delton Coombes yesterday.  The results are ***  Otherwise he denies any major changes in his health since he was last seen.  Fatigue?  He denies any fever, chills, night sweats, or unexplained weight loss.  He reports a mild cough without any chest pain, shortness of breath, or hemoptysis.  Denies any nausea, vomiting, diarrhea, or constipation.  Denies any headache or visual changes.  He is here today for evaluation and to review his results and for more detailed discussion about his current condition and recommended treatment options*     MEDICAL HISTORY: Past Medical History:  Diagnosis Date   AICD (automatic cardioverter/defibrillator) present    CAD (coronary artery disease) 80% stenosis diag of the LAD, 30% in OM2 branch of LCX in 2009    a. Nonobstructive CAD by cath 11/2011 with the exception of the pre-existing diagonal branch #2 lesion.   Chronic systolic CHF (congestive heart failure) (HCC)    CKD (chronic kidney disease) stage 3, GFR 30-59 ml/min (HCC)    Colon polyp, hyperplastic    History of kidney stones     History of stress test 06/01/2012   Normal myocardial perfusion study. compared to the previous study there is no significant change. this is a low risk scan   Hypertension    Legally blind    "both eyes"   Myocardial infarction [facility]) 11/22/2011   NICM (nonischemic cardiomyopathy) (HCC)    a. Remote hx of dilated NICM with EF ranging 20-45%, including normal EF by echo (55-60%) in 2014.   Peripheral arterial disease (HCC)    a. 06/2014: ABI right 0.99, left 1.2, LE dopplers revealing an occluded right posterior tibial. As symptoms were not felt r/t claudication, no further w/u at the time.   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 Requiring discontinuation of BB dose.   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  MEDICATIONS:  Current Outpatient Medications  Medication Sig Dispense Refill   albuterol (VENTOLIN HFA) 108 (90 Base) MCG/ACT inhaler Inhale 1-2 puffs into the lungs every 6 (six) hours as needed for wheezing or shortness of breath.     apixaban (ELIQUIS) 2.5 MG TABS tablet Take 1 tablet (2.5 mg total) by mouth 2 (two) times daily. 180 tablet 3   BREO ELLIPTA 100-25 MCG/INH AEPB Inhale 1 puff into the lungs every morning.     brimonidine (ALPHAGAN) 0.2 % ophthalmic solution Place  1 drop into both eyes 2 (two) times daily.     cycloSPORINE (RESTASIS) 0.05 % ophthalmic emulsion Place 1 drop into both eyes 2 (two) times daily.     diclofenac Sodium (VOLTAREN) 1 % GEL Apply 2 g topically 4 (four) times daily. 100 g 0   dorzolamide-timolol (COSOPT) 22.3-6.8 MG/ML ophthalmic solution Place 1 drop into both eyes 2 (two) times daily.     fexofenadine (ALLEGRA) 180 MG tablet Take 180 mg by mouth daily as needed for allergies or rhinitis.     furosemide (LASIX) 20 MG tablet TAKE 1 TABLET(20 MG) BY MOUTH DAILY (Patient taking  differently: Take 20 mg by mouth See admin instructions. Take 20 mg by mouth EVERY OTHER MORNING) 90 tablet 3   isosorbide mononitrate (IMDUR) 30 MG 24 hr tablet Take 30 mg by mouth daily.     metoprolol succinate (TOPROL-XL) 100 MG 24 hr tablet TAKE 1 TABLET(100 MG) BY MOUTH DAILY WITH OR IMMEDIATELY FOLLOWING A MEAL (Patient taking differently: Take 100 mg by mouth in the morning.) 90 tablet 3   nitroGLYCERIN (NITROSTAT) 0.4 MG SL tablet Place 1 tablet (0.4 mg total) under the tongue every 5 (five) minutes as needed for chest pain. 25 tablet 1   ROCKLATAN 0.02-0.005 % SOLN Apply 1 drop to eye at bedtime.     sacubitril-valsartan (ENTRESTO) 24-26 MG Take 1 tablet by mouth 2 (two) times daily. 60 tablet 11   spironolactone (ALDACTONE) 25 MG tablet Take 12.5 mg by mouth daily.     No current facility-administered medications for this visit.    SURGICAL HISTORY:  Past Surgical History:  Procedure Laterality Date   BACK SURGERY     BIV UPGRADE N/A 11/08/2019   Procedure: BIV UPGRADE;  Surgeon: Marinus Maw, MD;  Location: [facility] INVASIVE CV LAB;  Service: Cardiovascular;  Laterality: N/A;   CARDIAC CATHETERIZATION  11/2011   CARDIAC CATHETERIZATION  11/2011   didn't demonstrate high grade obstructive disease to account for his LV dysfunction.   CATARACT EXTRACTION, BILATERAL  1990's   CYSTOSCOPY     CYSTOSCOPY WITH BIOPSY Bilateral 08/03/2021   Procedure: CYSTOSCOPY WITH BLADDER BIOPSY, BILATERAL RETROGRADE PYELOGRAM;  Surgeon: Crista Elliot, MD;  Location: [facility] ORS;  Service: Urology;  Laterality: Bilateral;  REQUESTING 45 MINS   CYSTOSCOPY WITH URETHRAL DILATATION N/A 05/04/2013   Procedure: CYSTOSCOPY WITH URETHRAL DILATATION;  Surgeon: Tia Alert, MD;  Location: [facility] NEURO ORS;  Service: Neurosurgery;  Laterality: N/A;  with insertion of foley catheter   EP study and ablation of VT  7/13   PVC focus mapped to the right coronary cusp of the aorta, limited ablation performed due to  proximity of the focus to the right coronary artery   EYE SURGERY     LEFT HEART CATH AND CORONARY ANGIOGRAPHY N/A 11/07/2019   Procedure: LEFT HEART CATH AND CORONARY ANGIOGRAPHY;  Surgeon: Iran Ouch, MD;  Location: [facility] INVASIVE CV LAB;  Service: Cardiovascular;  Laterality: N/A;   LEFT HEART CATHETERIZATION WITH CORONARY ANGIOGRAM N/A 11/25/2011   Procedure: LEFT HEART CATHETERIZATION WITH CORONARY ANGIOGRAM;  Surgeon: Marykay Lex, MD;  Location: [facility] CATH LAB;  Service: Cardiovascular;  Laterality: N/A;   PACEMAKER IMPLANT N/A 07/12/2018   Procedure: PACEMAKER IMPLANT;  Surgeon: Thurmon Fair, MD;  Location: [facility] INVASIVE CV LAB;  Service: Cardiovascular;  Laterality: N/A;   POSTERIOR FUSION LUMBAR SPINE  1979   ROTATOR CUFF REPAIR  2000's   left   V-TACH ABLATION N/A 06/06/2012  Procedure: V-TACH ABLATION;  Surgeon: Hillis Range, MD;  Location: [facility] CATH LAB;  Service: Cardiovascular;  Laterality: N/A;    REVIEW OF SYSTEMS:   Review of Systems  Constitutional: Negative for appetite change, chills, fatigue, fever and unexpected weight change.  HENT:   Negative for mouth sores, nosebleeds, sore throat and trouble swallowing.   Eyes: Negative for eye problems and icterus.  Respiratory: Negative for cough, hemoptysis, shortness of breath and wheezing.   Cardiovascular: Negative for chest pain and leg swelling.  Gastrointestinal: Negative for abdominal pain, constipation, diarrhea, nausea and vomiting.  Genitourinary: Negative for bladder incontinence, difficulty urinating, dysuria, frequency and hematuria.   Musculoskeletal: Negative for back pain, gait problem, neck pain and neck stiffness.  Skin: Negative for itching and rash.  Neurological: Negative for dizziness, extremity weakness, gait problem, headaches, light-headedness and seizures.  Hematological: Negative for adenopathy. Does not bruise/bleed easily.  Psychiatric/Behavioral: Negative for confusion, depression and sleep  disturbance. The patient is not nervous/anxious.     PHYSICAL EXAMINATION:  There were no vitals taken for this visit.  ECOG PERFORMANCE STATUS: {CHL ONC ECOG [id number]  Physical Exam  Constitutional: Oriented to person, place, and time and well-developed, well-nourished, and in no distress. No distress.  HENT:  Head: Normocephalic and atraumatic.  Mouth/Throat: Oropharynx is clear and moist. No oropharyngeal exudate.  Eyes: Conjunctivae are normal. Right eye exhibits no discharge. Left eye exhibits no discharge. No scleral icterus.  Neck: Normal range of motion. Neck supple.  Cardiovascular: Normal rate, regular rhythm, normal heart sounds and intact distal pulses.   Pulmonary/Chest: Effort normal and breath sounds normal. No respiratory distress. No wheezes. No rales.  Abdominal: Soft. Bowel sounds are normal. Exhibits no distension and no mass. There is no tenderness.  Musculoskeletal: Normal range of motion. Exhibits no edema.  Lymphadenopathy:    No cervical adenopathy.  Neurological: Alert and oriented to person, place, and time. Exhibits normal muscle tone. Gait normal. Coordination normal.  Skin: Skin is warm and dry. No rash noted. Not diaphoretic. No erythema. No pallor.  Psychiatric: Mood, memory and judgment normal.  Vitals reviewed.  LABORATORY DATA: Lab Results  Component Value Date   WBC 4.2 06/14/2023   HGB 13.3 06/14/2023   HCT 39.2 06/14/2023   MCV 91.8 06/14/2023   PLT 136 (L) 06/14/2023      Chemistry      Component Value Date/Time   NA 141 06/14/2023 1352   NA 142 11/11/2022 1000   K 3.8 06/14/2023 1352   CL 108 06/14/2023 1352   CO2 28 06/14/2023 1352   BUN 16 06/14/2023 1352   BUN 18 11/11/2022 1000   CREATININE 1.50 (H) 06/14/2023 1352   CREATININE 1.37 (H) 01/24/2017 0950      Component Value Date/Time   CALCIUM 9.3 06/14/2023 1352   ALKPHOS 70 06/14/2023 1352   AST 15 06/14/2023 1352   ALT 10 06/14/2023 1352   BILITOT 0.7 06/14/2023  1352       RADIOGRAPHIC STUDIES:  CT HEAD W & WO CONTRAST ( )  Result Date: 06/24/2023 CLINICAL DATA:  82 year old male with recent CT evidence of lung adenocarcinoma. Pacemaker. Staging. EXAM: CT HEAD WITHOUT AND WITH CONTRAST TECHNIQUE: Contiguous axial images were obtained from the base of the skull through the vertex without and with intravenous contrast. RADIATION DOSE REDUCTION: This exam was performed according to the departmental dose-optimization program which includes automated exposure control, adjustment of the mA and/or kV according to patient size and/or use of iterative reconstruction technique.  CONTRAST:  75mL OMNIPAQUE IOHEXOL 300 MG/ML  SOLN COMPARISON:  Head CT without contrast 12/19/2019 and earlier. FINDINGS: Brain: Cerebral volume is stable since 2021 and within normal limits for age. No intracranial mass effect or midline shift. No ventriculomegaly. No acute intracranial hemorrhage identified. No cortically based acute infarct identified. Gray-white differentiation is stable and within normal limits. Solitary extra-axial semi circular mass with homogeneous enhancement over the left superior convexity is 18 x 16 x 9 mm, and was present in 2021, up to 14 mm long axis at that time. This is much more conspicuous today with the injection of contrast (series 4, image 24), in retrospect can be identified since 2017. But there remains little to no mass effect on the adjacent left superior frontal gyrus (series 5, image 23) and no associated cerebral edema. No other abnormal intracranial enhancement or dural thickening identified. Vascular: Major intracranial vascular structures are enhancing as expected. Calcified atherosclerosis at the skull base. Skull: Stable.  No acute or suspicious osseous lesion. Sinuses/Orbits: Visualized paranasal sinuses and mastoids are stable and well aerated. Other: Stable, negative orbit and scalp soft tissues. IMPRESSION: 1.  No metastatic disease or acute  intracranial abnormality. 2. 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 This is 2-4 mm larger since a 2021 18 mm now), with no associated cerebral edema or significant mass effect. Electronically Signed   By: Odessa Fleming M.D.   On: 06/24/2023 12:15   CT Super D Chest Wo Contrast  Result Date: 06/24/2023 CLINICAL DATA:  Follow-up pulmonary nodules.  * Tracking Code: BO * EXAM: CT CHEST WITHOUT CONTRAST TECHNIQUE: Multidetector CT imaging of the chest was performed using thin slice collimation for electromagnetic bronchoscopy planning purposes, without intravenous contrast. RADIATION DOSE REDUCTION: This exam was performed according to the departmental dose-optimization program which includes automated exposure control, adjustment of the mA and/or kV according to patient size and/or use of iterative reconstruction technique. COMPARISON:  06/02/2023 chest CT. FINDINGS: Cardiovascular: Normal heart size. No significant pericardial effusion/thickening. Three-vessel coronary atherosclerosis. Three lead left subclavian ICD with lead tips in the right atrium, right ventricular apex and coronary sinus. Atherosclerotic nonaneurysmal thoracic aorta. Normal caliber pulmonary arteries. Mediastinum/Nodes: No significant thyroid nodules. Unremarkable esophagus. No axillary adenopathy. Stable mildly enlarged 1.0 cm right paratracheal node (series 2/image 25). Stable mildly enlarged 1.3 cm subcarinal node (series 2/image 31). Stable mildly enlarged 1.1 cm AP window node (series 2/image 22). No discrete hilar adenopathy on these noncontrast images. Lungs/Pleura: No pneumothorax. No pleural effusion. Mild centrilobular emphysema. Irregular solid 5.3 x 2.2 cm medial lingular lung mass (series 4/image 94), stable from 5.2 x 2.2 cm on 06/02/2023 CT and increased from 4.3 x 1.7 cm on 09/15/2020 CT using similar measurement technique. Subsolid 4.7 x 3.4 cm posterior apical  left upper lobe lung mass with 2.2 cm solid component (series 4/image 34), unchanged from 06/02/2023 CT and increased from 4.2 x 3.3 cm with no measurable solid component on 09/15/2020 chest CT. Ground-glass 0.5 cm posterior right middle lobe pulmonary nodule (series 4/image 112), stable from 09/15/2020 CT. Solid 0.6 cm anterior left lower lobe pulmonary nodule (series 4/image 120), new from 09/15/2020 CT and unchanged from 06/02/2023 CT. Solid perifissural 0.5 cm left major fissure nodule (series 4/image 93) is stable from 09/15/2020 CT and considered benign. Upper abdomen: Cholecystectomy. Nonobstructing stones in the upper kidneys bilaterally, largest 7 mm on the right. Musculoskeletal: No aggressive appearing focal osseous lesions. Bilateral posterior spinal fusion  hardware extending inferiorly from the T11 level. Moderate lower thoracic spondylosis. IMPRESSION: 1. Subsolid 4.7 cm posterior apical left upper lobe lung mass with 2.2 cm solid component, unchanged from recent 06/02/2023 CT and increased from 4.2 cm with no measurable solid component on 09/15/2020 chest CT, most compatible with primary bronchogenic adenocarcinoma. 2. 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 3. Solid 0.6 cm anterior left lower lobe pulmonary nodule, new from 09/15/2020 CT and unchanged from recent 06/02/2023 CT, cannot exclude ipsilateral pulmonary metastasis. 4. Mild bilateral mediastinal lymphadenopathy, nonspecific, metastatic adenopathy not excluded. 5. Three-vessel coronary atherosclerosis. 6. Nonobstructing bilateral nephrolithiasis. 7. Aortic Atherosclerosis (ICD10-I70.0) and Emphysema (ICD10-J43.9). Electronically Signed   By: Delbert Phenix M.D.   On: 06/24/2023 11:20     ASSESSMENT/PLAN:  This is a very pleasant 82 year old African-American male with suspicious  stage *** ***.  He presented with a **.  He was diagnosed in August 2024.  Request molecular studies?  The patient was seen with Dr. Arbutus Ped today.  Dr. Arbutus Ped personally and independently reviewed his scan and discussed the results with the patient today.  The scan showed ***  Dr. Arbutus Ped had a lengthly discussion with the patient today about her current condition and treatment options. The patient was given the option of ***.  The patient is interested in proceeding with systemic chemotherapy.  She is expected to start her first dose of this treatment on __.  We discussed the adverse side effects of treatment including but not limited to alopecia, myelosuppression, nausea and vomiting, peripheral neuropathy, liver or renal dysfunction as well as immunotherapy mediated adverse effects.   I will arrange for the patient to have a chemoeducation class prior to receiving his first cycle of chemotherapy.   We will arrange for***   I sent a prescription for Compazine 10 mg every 6 hours as needed for nausea.   The patient will follow-up in 2 weeks for a one-week follow-up visit after completing his first cycle of chemotherapy.  The patient was advised to call immediately if she has any concerning symptoms in the interval. The patient voices understanding of current disease status and treatment options and is in agreement with the current care plan. All questions were answered. The patient knows to call the clinic with any problems, questions or concerns. We can certainly see the patient much sooner if necessary *  No orders of the defined types were placed in this encounter.    I spent {CHL ONC TIME VISIT - GNFAO:[id number]} counseling the patient face to face. The total time spent in the appointment was {CHL ONC TIME VISIT - NGEXB:[id number]}.   L , PA-C 07/03/23

## 2023-07-05 ENCOUNTER — Ambulatory Visit (HOSPITAL_COMMUNITY)
Admission: RE | Admit: 2023-07-05 | Discharge: 2023-07-05 | Disposition: A | Discharge status: Home / Self Care | Payer: 59 | Attending: Emergency Medicine | Admitting: Emergency Medicine

## 2023-07-05 ENCOUNTER — Ambulatory Visit (HOSPITAL_COMMUNITY): Payer: 59 | Admitting: Physician Assistant

## 2023-07-05 ENCOUNTER — Encounter (HOSPITAL_COMMUNITY): Payer: Self-pay | Admitting: Emergency Medicine

## 2023-07-05 ENCOUNTER — Ambulatory Visit (HOSPITAL_BASED_OUTPATIENT_CLINIC_OR_DEPARTMENT_OTHER): Payer: 59 | Admitting: Physician Assistant

## 2023-07-05 ENCOUNTER — Ambulatory Visit (HOSPITAL_COMMUNITY): Payer: 59

## 2023-07-05 ENCOUNTER — Encounter (HOSPITAL_COMMUNITY)
Admission: RE | Disposition: A | Discharge status: Home / Self Care | Payer: Self-pay | Source: Home / Self Care | Attending: Emergency Medicine

## 2023-07-05 ENCOUNTER — Other Ambulatory Visit: Payer: Self-pay

## 2023-07-05 DIAGNOSIS — R846 Abnormal cytological findings in specimens from respiratory organs and thorax: Secondary | ICD-10-CM | POA: Diagnosis not present

## 2023-07-05 DIAGNOSIS — I700 Atherosclerosis of aorta: Secondary | ICD-10-CM | POA: Insufficient documentation

## 2023-07-05 DIAGNOSIS — H547 Unspecified visual loss: Secondary | ICD-10-CM | POA: Insufficient documentation

## 2023-07-05 DIAGNOSIS — I480 Paroxysmal atrial fibrillation: Secondary | ICD-10-CM | POA: Insufficient documentation

## 2023-07-05 DIAGNOSIS — I739 Peripheral vascular disease, unspecified: Secondary | ICD-10-CM | POA: Insufficient documentation

## 2023-07-05 DIAGNOSIS — I5022 Chronic systolic (congestive) heart failure: Secondary | ICD-10-CM | POA: Insufficient documentation

## 2023-07-05 DIAGNOSIS — I252 Old myocardial infarction: Secondary | ICD-10-CM | POA: Diagnosis not present

## 2023-07-05 DIAGNOSIS — J449 Chronic obstructive pulmonary disease, unspecified: Secondary | ICD-10-CM | POA: Insufficient documentation

## 2023-07-05 DIAGNOSIS — N1832 Chronic kidney disease, stage 3b: Secondary | ICD-10-CM | POA: Diagnosis not present

## 2023-07-05 DIAGNOSIS — R9389 Abnormal findings on diagnostic imaging of other specified body structures: Secondary | ICD-10-CM | POA: Diagnosis not present

## 2023-07-05 DIAGNOSIS — I130 Hypertensive heart and chronic kidney disease with heart failure and stage 1 through stage 4 chronic kidney disease, or unspecified chronic kidney disease: Secondary | ICD-10-CM | POA: Diagnosis not present

## 2023-07-05 DIAGNOSIS — I5032 Chronic diastolic (congestive) heart failure: Secondary | ICD-10-CM | POA: Diagnosis not present

## 2023-07-05 DIAGNOSIS — J439 Emphysema, unspecified: Secondary | ICD-10-CM | POA: Insufficient documentation

## 2023-07-05 DIAGNOSIS — J9811 Atelectasis: Secondary | ICD-10-CM | POA: Diagnosis not present

## 2023-07-05 DIAGNOSIS — Z87891 Personal history of nicotine dependence: Secondary | ICD-10-CM | POA: Diagnosis not present

## 2023-07-05 DIAGNOSIS — Z950 Presence of cardiac pacemaker: Secondary | ICD-10-CM | POA: Diagnosis not present

## 2023-07-05 DIAGNOSIS — Z7901 Long term (current) use of anticoagulants: Secondary | ICD-10-CM | POA: Diagnosis not present

## 2023-07-05 DIAGNOSIS — Z95810 Presence of automatic (implantable) cardiac defibrillator: Secondary | ICD-10-CM | POA: Diagnosis not present

## 2023-07-05 DIAGNOSIS — R918 Other nonspecific abnormal finding of lung field: Secondary | ICD-10-CM

## 2023-07-05 DIAGNOSIS — C3412 Malignant neoplasm of upper lobe, left bronchus or lung: Secondary | ICD-10-CM | POA: Insufficient documentation

## 2023-07-05 DIAGNOSIS — N183 Chronic kidney disease, stage 3 unspecified: Secondary | ICD-10-CM | POA: Insufficient documentation

## 2023-07-05 DIAGNOSIS — I428 Other cardiomyopathies: Secondary | ICD-10-CM | POA: Diagnosis not present

## 2023-07-05 DIAGNOSIS — I2510 Atherosclerotic heart disease of native coronary artery without angina pectoris: Secondary | ICD-10-CM | POA: Insufficient documentation

## 2023-07-05 DIAGNOSIS — C3490 Malignant neoplasm of unspecified part of unspecified bronchus or lung: Secondary | ICD-10-CM | POA: Diagnosis not present

## 2023-07-05 DIAGNOSIS — R590 Localized enlarged lymph nodes: Secondary | ICD-10-CM | POA: Insufficient documentation

## 2023-07-05 HISTORY — PX: FINE NEEDLE ASPIRATION: SHX5430

## 2023-07-05 HISTORY — PX: BRONCHIAL BIOPSY: SHX5109

## 2023-07-05 HISTORY — PX: BRONCHIAL NEEDLE ASPIRATION BIOPSY: SHX5106

## 2023-07-05 HISTORY — PX: VIDEO BRONCHOSCOPY WITH ENDOBRONCHIAL ULTRASOUND: SHX6177

## 2023-07-05 HISTORY — PX: BRONCHIAL BRUSHINGS: SHX5108

## 2023-07-05 HISTORY — PX: BRONCHIAL WASHINGS: SHX5105

## 2023-07-05 HISTORY — DX: Personal history of urinary calculi: Z87.442

## 2023-07-05 LAB — CULTURE, BAL-QUANTITATIVE W GRAM STAIN

## 2023-07-05 LAB — AEROBIC/ANAEROBIC CULTURE W GRAM STAIN (SURGICAL/DEEP WOUND)

## 2023-07-05 SURGERY — BRONCHOSCOPY, WITH BIOPSY USING ELECTROMAGNETIC NAVIGATION
Anesthesia: General

## 2023-07-05 MED ORDER — CHLORHEXIDINE GLUCONATE 0.12 % MT SOLN
15.0000 mL | Freq: Once | OROMUCOSAL | Status: AC
  Administered 2023-07-05: 15 mL via OROMUCOSAL
  Filled 2023-07-05: qty 15

## 2023-07-05 MED ORDER — METOPROLOL SUCCINATE ER 100 MG PO TB24: 100.0000 mg | ORAL_TABLET | Freq: Every morning | ORAL | Status: DC

## 2023-07-05 MED ORDER — SUGAMMADEX SODIUM 200 MG/2ML IV SOLN
INTRAVENOUS | Status: DC | PRN
  Administered 2023-07-05: 154.2 mg via INTRAVENOUS

## 2023-07-05 MED ORDER — ACETAMINOPHEN 10 MG/ML IV SOLN
INTRAVENOUS | Status: DC | PRN
  Administered 2023-07-05: 1000 mg via INTRAVENOUS

## 2023-07-05 MED ORDER — PROPOFOL 10 MG/ML IV BOLUS
INTRAVENOUS | Status: DC | PRN
Start: 2023-07-05 — End: 2023-07-05
  Administered 2023-07-05: 40 mg via INTRAVENOUS
  Administered 2023-07-05 (×4): 20 ug via INTRAVENOUS
  Administered 2023-07-05: 125 ug/kg/min via INTRAVENOUS
  Administered 2023-07-05: 10 ug via INTRAVENOUS
  Administered 2023-07-05: 100 mg via INTRAVENOUS

## 2023-07-05 MED ORDER — LIDOCAINE 2% (20 MG/ML) 5 ML SYRINGE
INTRAMUSCULAR | Status: DC | PRN
  Administered 2023-07-05: 60 mg via INTRAVENOUS

## 2023-07-05 MED ORDER — LACTATED RINGERS IV SOLN: INTRAVENOUS | Status: DC

## 2023-07-05 MED ORDER — ONDANSETRON HCL 4 MG/2ML IJ SOLN
INTRAMUSCULAR | Status: DC | PRN
  Administered 2023-07-05: 4 mg via INTRAVENOUS

## 2023-07-05 MED ORDER — APIXABAN 2.5 MG PO TABS: 2.5000 mg | ORAL_TABLET | Freq: Two times a day (BID) | ORAL | Status: DC

## 2023-07-05 MED ORDER — FUROSEMIDE 20 MG PO TABS: 20.0000 mg | ORAL_TABLET | ORAL | Status: DC

## 2023-07-05 MED ORDER — ROCURONIUM BROMIDE 10 MG/ML (PF) SYRINGE
PREFILLED_SYRINGE | INTRAVENOUS | Status: DC | PRN
  Administered 2023-07-05: 50 mg via INTRAVENOUS
  Administered 2023-07-05: 10 mg via INTRAVENOUS

## 2023-07-05 NOTE — Discharge Instructions (Signed)
Flexible Bronchoscopy, Care After This sheet gives you information about how to care for yourself after your test. Your doctor may also give you more specific instructions. If you have problems or questions, contact your doctor. Follow these instructions at home: Eating and drinking When your numbness is gone and your cough and gag reflexes have come back, you may: Eat only soft foods. Slowly drink liquids. The day after the test, go back to your normal diet. Driving Do not drive for 24 hours if you were given a medicine to help you relax (sedative). Do not drive or use heavy machinery while taking prescription pain medicine. General instructions  Take over-the-counter and prescription medicines only as told by your doctor. Return to your normal activities as told. Ask what activities are safe for you. Do not use any products that have nicotine or tobacco in them. This includes cigarettes and e-cigarettes. If you need help quitting, ask your doctor. Keep all follow-up visits as told by your doctor. This is important. It is very important if you had a tissue sample (biopsy) taken. Get help right away if: You have shortness of breath that gets worse. You get light-headed. You feel like you are going to pass out (faint). You have chest pain. You cough up: More than a little blood. More blood than before. Summary Do not eat or drink anything (not even water) for 2 hours after your test, or until your numbing medicine wears off. Do not use cigarettes. Do not use e-cigarettes. Get help right away if you have chest pain.  Please call our office for any questions or concerns.  [phone].  This information is not intended to replace advice given to you by your health care provider. Make sure you discuss any questions you have with your health care provider. Document Released: 09/05/2009 Document Revised: 10/21/2017 Document Reviewed: 11/26/2016 Elsevier Patient Education  2020 Else[employer]  Inc.  

## 2023-07-05 NOTE — Interval H&P Note (Signed)
History and Physical Interval Note:  07/05/2023 7:48 AM  [name]  has presented today for surgery, with the diagnosis of LEFT UPPER LOBE MASS, LINGULAR MASS, MEDISTINAL ADENOPATHY.  The various methods of treatment have been discussed with the patient and family. After consideration of risks, benefits and other options for treatment, the patient has consented to  Procedure(s): ROBOTIC ASSISTED NAVIGATIONAL BRONCHOSCOPY (N/A) VIDEO BRONCHOSCOPY WITH ENDOBRONCHIAL ULTRASOUND (N/A) as a surgical intervention.  The patient's history has been reviewed, patient examined, no change in status, stable for surgery.  I have reviewed the patient's chart and labs.  Questions were answered to the patient's satisfaction.     Leslye Peer

## 2023-07-05 NOTE — Op Note (Signed)
Video Bronchoscopy with Robotic Assisted Bronchoscopic Navigation and Endobronchial Ultrasound Procedure Note  Date of Operation: 07/05/2023   Pre-op Diagnosis: Left upper lobe mass, lingular mass, mediastinal adenopathy  Post-op Diagnosis: Same  Surgeon: Levy Pupa  Assistants: None  Anesthesia: General endotracheal anesthesia  Operation: Flexible video fiberoptic bronchoscopy with robotic assistance and biopsies.  Estimated Blood Loss: Minimal  Complications: None  Indications and History: [name] is a 82 y.o. male with history of history tobacco use and abnormal CT scan of the chest with lingular and left upper lobe masses, mediastinal adenopathy.  Recommendation was made to achieve a tissue diagnosis via robotic assisted navigational bronchoscopy and endobronchial ultrasound with biopsies. The risks, benefits, complications, treatment options and expected outcomes were discussed with the patient.  The possibilities of pneumothorax, pneumonia, reaction to medication, pulmonary aspiration, perforation of a viscus, bleeding, failure to diagnose a condition and creating a complication requiring transfusion or operation were discussed with the patient who freely signed the consent.    Description of Procedure: The patient was seen in the Preoperative Area, was examined and was deemed appropriate to proceed.  The patient was taken to [facility] endoscopy room 3, identified as [name] and the procedure verified as Flexible Video Fiberoptic Bronchoscopy.  A Time Out was held and the above information confirmed.   Prior to the date of the procedure a high-resolution CT scan of the chest was performed. Utilizing ION software program a virtual tracheobronchial tree was generated to allow the creation of distinct navigation pathways to the patient's parenchymal abnormalities. After being taken to the operating room general anesthesia was initiated and the patient  was orally intubated.  The video fiberoptic bronchoscope was introduced via the endotracheal tube and a general inspection was performed which showed normal right and left lung anatomy. Aspiration of the bilateral mainstems was completed to remove any remaining secretions. Robotic catheter inserted into patient's endotracheal tube.   Target #1 lingular mass: The distinct navigation pathways prepared prior to this procedure were then utilized to navigate to patient's lesion identified on CT scan. The robotic catheter was secured into place and the vision probe was withdrawn.  Lesion location was approximated using fluoroscopy.  Local registration and targeting was performed using Cios three-dimensional imaging.  Under fluoroscopic guidance transbronchial needle brushings, transbronchial needle biopsies, and transbronchial forceps biopsies were performed to be sent for cytology and pathology. A bronchioalveolar lavage was performed in the lingula and sent for microbiology.  Target #2 left upper lobe mass: The distinct navigation pathways prepared prior to this procedure were then utilized to navigate to patient's lesion identified on CT scan. The robotic catheter was secured into place and the vision probe was withdrawn.  Lesion location was approximated using fluoroscopy. Local registration and targeting was performed using Cios three-dimensional imaging. Under fluoroscopic guidance transbronchial needle brushings, transbronchial needle biopsies, and transbronchial forceps biopsies were performed to be sent for cytology and pathology.  Needle in lesion was established using Cios-spin.  The robotic scope was then withdrawn and the endobronchial ultrasound was used to identify and characterize the peritracheal, hilar and bronchial lymph nodes. Inspection showed enlarged nodes at stations 4L, 7, 4R, 11R. Using real-time ultrasound guidance Wang needle biopsies were take from Station 4L, 7, 4R, 11R nodes and were sent for cytology.    At the end of the procedure a general airway inspection was performed and there was no evidence of active bleeding. The bronchoscope was removed.  The patient tolerated the procedure well.  There was no significant blood loss and there were no obvious complications. A post-procedural chest x-ray is pending.  Samples Target #1: 1. Transbronchial needle brushings from lingular mass 2. Transbronchial Wang needle biopsies from lingular mass 3. Transbronchial forceps biopsies from lingular mass 4. Bronchoalveolar lavage from lingula  Samples Target #2: 1. Transbronchial needle brushings from left upper lobe mass 2. Transbronchial Wang needle biopsies from left upper lobe mass 3. Transbronchial forceps biopsies from left upper lobe mass  EBUS Samples: 1. Wang needle biopsies from 4L node 2. Wang needle biopsies from 7 node 3. Wang needle biopsies from 4R node 4. Wang needle biopsies from 11 R node    Plans:  The patient will be discharged from the PACU to home when recovered from anesthesia and after chest x-ray is reviewed. We will review the cytology, pathology and microbiology results with the patient when they become available. Outpatient followup will be with Dr Delton Coombes.    Levy Pupa, MD, PhD 07/05/2023, 10:58 AM [facility] [phone] or if no answer before 7:00PM call [phone] For any issues after 7:00PM please call eLink [phone]

## 2023-07-05 NOTE — Anesthesia Procedure Notes (Signed)
Procedure Name: Intubation Date/Time: 07/05/2023 9:18 AM  Performed by: Cy Blamer, CRNAPre-anesthesia Checklist: Patient identified, Emergency Drugs available, Suction available and Patient being monitored Patient Re-evaluated:Patient Re-evaluated prior to induction Oxygen Delivery Method: Circle system utilized Preoxygenation: Pre-oxygenation with 100% oxygen Induction Type: IV induction Ventilation: Mask ventilation without difficulty Laryngoscope Size: Mac and 4 Grade View: Grade I Tube type: Oral Tube size: 8.5 mm Number of attempts: 1 Airway Equipment and Method: Stylet and Oral airway Placement Confirmation: ETT inserted through vocal cords under direct vision, positive ETCO2 and breath sounds checked- equal and bilateral Secured at: 22 cm Tube secured with: Tape Dental Injury: Teeth and Oropharynx as per pre-operative assessment

## 2023-07-05 NOTE — Progress Notes (Signed)
Patient placed in phase 2, awaiting chest x ray results before discharge.

## 2023-07-05 NOTE — Transfer of Care (Signed)
Immediate Anesthesia Transfer of Care Note  Patient: [name]  Procedure(s) Performed: ROBOTIC ASSISTED NAVIGATIONAL BRONCHOSCOPY VIDEO BRONCHOSCOPY WITH ENDOBRONCHIAL ULTRASOUND BRONCHIAL BIOPSIES BRONCHIAL NEEDLE ASPIRATION BIOPSIES BRONCHIAL BRUSHINGS FINE NEEDLE ASPIRATION (FNA) LINEAR BRONCHIAL WASHINGS  Patient Location: PACU  Anesthesia Type:General  Level of Consciousness: awake, alert , and oriented  Airway & Oxygen Therapy: Patient Spontanous Breathing and Patient connected to face mask oxygen  Post-op Assessment: Report given to RN, Post -op Vital signs reviewed and stable, Patient moving all extremities X 4, and Patient able to stick tongue midline  Post vital signs: Reviewed and stable  Last Vitals:  Vitals Value Taken Time  BP 112/72   Temp 98.6   Pulse 67 07/05/23 1104  Resp 18   SpO2 98 % 07/05/23 1104  Vitals shown include unfiled device data.  Last Pain:  Vitals:   07/05/23 0711  TempSrc:   PainSc: 0-No pain         Complications: No notable events documented.

## 2023-07-06 ENCOUNTER — Other Ambulatory Visit: Payer: Self-pay

## 2023-07-06 ENCOUNTER — Inpatient Hospital Stay: Payer: 59 | Attending: Internal Medicine | Admitting: Physician Assistant

## 2023-07-06 VITALS — BP 150/79 | HR 69 | Temp 97.7°F | Resp 13 | Wt 172.0 lb

## 2023-07-06 DIAGNOSIS — J432 Centrilobular emphysema: Secondary | ICD-10-CM | POA: Insufficient documentation

## 2023-07-06 DIAGNOSIS — N1832 Chronic kidney disease, stage 3b: Secondary | ICD-10-CM | POA: Insufficient documentation

## 2023-07-06 DIAGNOSIS — C3412 Malignant neoplasm of upper lobe, left bronchus or lung: Secondary | ICD-10-CM | POA: Insufficient documentation

## 2023-07-06 DIAGNOSIS — I739 Peripheral vascular disease, unspecified: Secondary | ICD-10-CM | POA: Diagnosis not present

## 2023-07-06 DIAGNOSIS — N200 Calculus of kidney: Secondary | ICD-10-CM | POA: Insufficient documentation

## 2023-07-06 DIAGNOSIS — R590 Localized enlarged lymph nodes: Secondary | ICD-10-CM | POA: Insufficient documentation

## 2023-07-06 DIAGNOSIS — Z8719 Personal history of other diseases of the digestive system: Secondary | ICD-10-CM | POA: Insufficient documentation

## 2023-07-06 DIAGNOSIS — D329 Benign neoplasm of meninges, unspecified: Secondary | ICD-10-CM | POA: Diagnosis not present

## 2023-07-06 DIAGNOSIS — Z79899 Other long term (current) drug therapy: Secondary | ICD-10-CM | POA: Diagnosis not present

## 2023-07-06 DIAGNOSIS — Z981 Arthrodesis status: Secondary | ICD-10-CM | POA: Insufficient documentation

## 2023-07-06 DIAGNOSIS — D4989 Neoplasm of unspecified behavior of other specified sites: Secondary | ICD-10-CM

## 2023-07-06 DIAGNOSIS — I700 Atherosclerosis of aorta: Secondary | ICD-10-CM | POA: Diagnosis not present

## 2023-07-06 DIAGNOSIS — Z7901 Long term (current) use of anticoagulants: Secondary | ICD-10-CM | POA: Diagnosis not present

## 2023-07-06 DIAGNOSIS — M47814 Spondylosis without myelopathy or radiculopathy, thoracic region: Secondary | ICD-10-CM | POA: Insufficient documentation

## 2023-07-06 DIAGNOSIS — I5022 Chronic systolic (congestive) heart failure: Secondary | ICD-10-CM | POA: Diagnosis not present

## 2023-07-06 DIAGNOSIS — C3490 Malignant neoplasm of unspecified part of unspecified bronchus or lung: Secondary | ICD-10-CM

## 2023-07-06 DIAGNOSIS — I252 Old myocardial infarction: Secondary | ICD-10-CM | POA: Insufficient documentation

## 2023-07-06 DIAGNOSIS — R61 Generalized hyperhidrosis: Secondary | ICD-10-CM | POA: Insufficient documentation

## 2023-07-06 DIAGNOSIS — Z87442 Personal history of urinary calculi: Secondary | ICD-10-CM | POA: Diagnosis not present

## 2023-07-06 DIAGNOSIS — I130 Hypertensive heart and chronic kidney disease with heart failure and stage 1 through stage 4 chronic kidney disease, or unspecified chronic kidney disease: Secondary | ICD-10-CM | POA: Insufficient documentation

## 2023-07-06 NOTE — Anesthesia Postprocedure Evaluation (Signed)
Anesthesia Post Note  Patient: [name]  Procedure(s) Performed: ROBOTIC ASSISTED NAVIGATIONAL BRONCHOSCOPY VIDEO BRONCHOSCOPY WITH ENDOBRONCHIAL ULTRASOUND BRONCHIAL BIOPSIES BRONCHIAL NEEDLE ASPIRATION BIOPSIES BRONCHIAL BRUSHINGS FINE NEEDLE ASPIRATION (FNA) LINEAR BRONCHIAL WASHINGS     Patient location during evaluation: PACU Anesthesia Type: General Level of consciousness: awake and alert Pain management: pain level controlled Vital Signs Assessment: post-procedure vital signs reviewed and stable Respiratory status: spontaneous breathing, nonlabored ventilation, respiratory function stable and patient connected to nasal cannula oxygen Cardiovascular status: blood pressure returned to baseline and stable Postop Assessment: no apparent nausea or vomiting Anesthetic complications: no   No notable events documented.  Last Vitals:  Vitals:   07/05/23 1200 07/05/23 1215  BP: (!) 144/95 127/83  Pulse: 69 69  Resp: 10 14  Temp: 36.7 C   SpO2: 95% 95%    Last Pain:  Vitals:   07/05/23 1200  TempSrc:   PainSc: 0-No pain                 , S

## 2023-07-07 ENCOUNTER — Encounter: Payer: Self-pay | Admitting: General Practice

## 2023-07-07 NOTE — Progress Notes (Signed)
Rodney Mor, DO  , Katheren Shams, NT OK for attempt at US guided axillary node biopsy.  Both right and left axillary nodes FDG +.  Small.  Might be core or FNA.    PET 8/9  [facility]       Previous Messages    ----- Message ----- From: Caroleen Hamman, NT Sent: 07/07/2023   9:56 AM EDT To: Ir Procedure Requests Subject: [place] CORE BIOPSY (LYMPH NODES)                  Procedure: [place] CORE BIOPSY (LYMPH NODES)  Reason: Please see if you can biposy the hypermetabolic lymph node on the LEFT axillary region. Please see if you can do this next week if possible. 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

## 2023-07-07 NOTE — Progress Notes (Signed)
Gilmer Mor, DO  ,  J, NT I would just set him up for 1 biopsy, US guided axillary node.  The doctor performing can decide based on what he sees.  JW       Previous Messages    ----- Message ----- From: Caroleen Hamman, NT Sent: 07/07/2023  12:34 PM EDT To: Gilmer Mor, DO Subject: RE: [place] CORE BIOPSY (LYMPH NODES)              Just to make sure are you wanting to do US Biopsy of right axillary node and US biopsy of left axillary node? Or just the US biopsy of left side as per order?   Sorry just want to make sure I have it correct>  Thanks ----- Message ----- From: Gilmer Mor, DO Sent: 07/07/2023  11:45 AM EDT To: Caroleen Hamman, NT Subject: RE: [place] CORE BIOPSY (LYMPH NODES)              OK for attempt at US guided axillary node biopsy.  Both right and left axillary nodes FDG +.  Small.  Might be core or FNA.    PET 8/9  [address] ----- Message ----- From: Caroleen Hamman, NT Sent: 07/07/2023   9:56 AM EDT To: Ir Procedure Requests Subject: [place] CORE BIOPSY (LYMPH NODES)                  Procedure: [place] CORE BIOPSY (LYMPH NODES)  Reason: Please see if you can biposy the hypermetabolic lymph node on the LEFT axillary region. Please see if you can do this next week if possible. 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

## 2023-07-08 ENCOUNTER — Telehealth: Payer: Self-pay | Admitting: Physician Assistant

## 2023-07-08 ENCOUNTER — Telehealth: Payer: Self-pay | Admitting: Emergency Medicine

## 2023-07-08 NOTE — Telephone Encounter (Signed)
Called the patient to let him know that his biopsy results have not yet been reported.  I will contact him when they are available.

## 2023-07-11 ENCOUNTER — Encounter: Payer: Self-pay | Admitting: General Practice

## 2023-07-11 ENCOUNTER — Encounter (HOSPITAL_COMMUNITY): Payer: Self-pay | Admitting: Emergency Medicine

## 2023-07-11 DIAGNOSIS — C3490 Malignant neoplasm of unspecified part of unspecified bronchus or lung: Secondary | ICD-10-CM | POA: Diagnosis not present

## 2023-07-11 NOTE — Progress Notes (Signed)
Leslye Peer, MD  Caroleen Hamman, NT Yes thank you       Previous Messages    ----- Message ----- From: Caroleen Hamman, NT Sent: 07/11/2023  11:24 AM EDT To: Leslye Peer, MD Subject: Blood Thinner Hold                            Good Morning The above pt is scheduled for a Biopsy on 07/26/23 and will need to hold his eliquis for 48 hr prior to the procedure. May we have your permission to hold this medication prior to procedure?  Thanks [employer]

## 2023-07-11 NOTE — Telephone Encounter (Signed)
Reviewed results with the patient. Nodule shows adenocarcinoma. All of the EBUS nodes are negative. He has f/u w oncology on 8/29.

## 2023-07-14 ENCOUNTER — Other Ambulatory Visit: Payer: 59

## 2023-07-14 ENCOUNTER — Ambulatory Visit: Payer: 59 | Admitting: Physician Assistant

## 2023-07-19 NOTE — Progress Notes (Deleted)
[facility] OFFICE PROGRESS NOTE  [name], MD [address] [place] [address]  DIAGNOSIS: Stage IIIC/IV (T3, N3, M0/M1b) non-small cell lung cancer, adenocarcinoma. He presented with a left upper lobe spiculated mass in addition to lingular lesion and suspicious mediastinal lymphadenopathy He also has bilateral hypermetabolic axillary lymph nodes which could be related to metastatic disease versus inflammatory as the patient did have vaccines in both of his arms the week prior to his PET scan   ***Molecular studies ***  PRIOR THERAPY: None  CURRENT THERAPY: ***  INTERVAL HISTORY: [name] 82 y.o. male returns to the clinic today for a follow up visit. The patient was last seen by Dr. Arbutus Ped and myself on 07/06/23. The results of his bronchoscopy were pending at that time. We ordered molecular studies. Is scheduled for US biopsy of the axillary lymph nodes on 07/27/23.   If the lymph node biopsy is negative, we may consider him for concurrent chemoradiation. If his lymph node is positive, then he likely will need systemic chemotherapy and possibly immunotherapy.   Since last being seen, he denies any major changes in his health.  He denies any fever, chills, or unexplained weight loss.  He reports that he "always" has night sweats.  He reports his appetite is "pretty good".  He reports his breathing is "the same".  He denies any significant dyspnea but may have some on exertion "sometimes".  He denies any significant cough and Denies any chest pain or hemoptysis.  Denies any nausea, vomiting, diarrhea, or constipation.  Denies any headache or visual changes.  He is here today for evaluation and to discuss the next steps in his care.   MEDICAL HISTORY: Past Medical History:  Diagnosis Date   AICD (automatic cardioverter/defibrillator) present    CAD (coronary artery disease) 80% stenosis diag of the LAD, 30% in OM2 branch of LCX in 2009    a. Nonobstructive  CAD by cath 11/2011 with the exception of the pre-existing diagonal branch #2 lesion.   Chronic systolic CHF (congestive heart failure) (HCC)    CKD (chronic kidney disease) stage 3, GFR 30-59 ml/min (HCC)    Colon polyp, hyperplastic    History of kidney stones    History of stress test 06/01/2012   Normal myocardial perfusion study. compared to the previous study there is no significant change. this is a low risk scan   Hypertension    Legally blind    "both eyes"   Myocardial infarction [facility]) 11/22/2011   NICM (nonischemic cardiomyopathy) (HCC)    a. Remote hx of dilated NICM with EF ranging 20-45%, including normal EF by echo (55-60%) in 2014.   Peripheral arterial disease (HCC)    a. 06/2014: ABI right 0.99, left 1.2, LE dopplers revealing an occluded right posterior tibial. As symptoms were not felt r/t claudication, no further w/u at the time.   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 Requiring discontinuation of BB dose.   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  MEDICATIONS:  Current Outpatient Medications  Medication Sig Dispense Refill   albuterol (VENTOLIN HFA) 108 (90 Base) MCG/ACT inhaler Inhale 1-2 puffs into the lungs every 6 (six) hours as needed for wheezing or shortness of breath.     apixaban (ELIQUIS) 2.5 MG TABS tablet  Take 1 tablet (2.5 mg total) by mouth 2 (two) times daily. Okay to restart this medication on 07/06/2023     BREO ELLIPTA 100-25 MCG/INH AEPB Inhale 1 puff into the lungs every morning.     brimonidine (ALPHAGAN) 0.2 % ophthalmic solution Place 1 drop into both eyes 2 (two) times daily.     cycloSPORINE (RESTASIS) 0.05 % ophthalmic emulsion Place 1 drop into both eyes 2 (two) times daily.     diclofenac Sodium (VOLTAREN) 1 % GEL Apply 2 g topically 4 (four) times daily. 100 g 0    dorzolamide-timolol (COSOPT) 22.3-6.8 MG/ML ophthalmic solution Place 1 drop into both eyes 2 (two) times daily.     fexofenadine (ALLEGRA) 180 MG tablet Take 180 mg by mouth daily as needed for allergies or rhinitis.     furosemide (LASIX) 20 MG tablet Take 1 tablet (20 mg total) by mouth See admin instructions. Take 20 mg by mouth EVERY OTHER MORNING     isosorbide mononitrate (IMDUR) 30 MG 24 hr tablet Take 30 mg by mouth daily.     metoprolol succinate (TOPROL-XL) 100 MG 24 hr tablet Take 1 tablet (100 mg total) by mouth in the morning. Take with or immediately following a meal.     nitroGLYCERIN (NITROSTAT) 0.4 MG SL tablet Place 1 tablet (0.4 mg total) under the tongue every 5 (five) minutes as needed for chest pain. 25 tablet 1   ROCKLATAN 0.02-0.005 % SOLN Apply 1 drop to eye at bedtime.     sacubitril-valsartan (ENTRESTO) 24-26 MG Take 1 tablet by mouth 2 (two) times daily. 60 tablet 11   spironolactone (ALDACTONE) 25 MG tablet Take 12.5 mg by mouth daily.     No current facility-administered medications for this visit.    SURGICAL HISTORY:  Past Surgical History:  Procedure Laterality Date   BACK SURGERY     BIV UPGRADE N/A 11/08/2019   Procedure: BIV UPGRADE;  Surgeon: Marinus Maw, MD;  Location: [facility] INVASIVE CV LAB;  Service: Cardiovascular;  Laterality: N/A;   BRONCHIAL BIOPSY  07/05/2023   Procedure: BRONCHIAL BIOPSIES;  Surgeon: Leslye Peer, MD;  Location: [facility] ENDOSCOPY;  Service: Pulmonary;;   BRONCHIAL BRUSHINGS  07/05/2023   Procedure: BRONCHIAL BRUSHINGS;  Surgeon: Leslye Peer, MD;  Location: [facility] ENDOSCOPY;  Service: Pulmonary;;   BRONCHIAL NEEDLE ASPIRATION BIOPSY  07/05/2023   Procedure: BRONCHIAL NEEDLE ASPIRATION BIOPSIES;  Surgeon: Leslye Peer, MD;  Location: [facility] ENDOSCOPY;  Service: Pulmonary;;   BRONCHIAL WASHINGS  07/05/2023   Procedure: BRONCHIAL WASHINGS;  Surgeon: Leslye Peer, MD;  Location: [facility] ENDOSCOPY;  Service: Pulmonary;;   CARDIAC  CATHETERIZATION  11/2011   CARDIAC CATHETERIZATION  11/2011   didn't demonstrate high grade obstructive disease to account for his LV dysfunction.   CATARACT EXTRACTION, BILATERAL  1990's   CYSTOSCOPY     CYSTOSCOPY WITH BIOPSY Bilateral 08/03/2021   Procedure: CYSTOSCOPY WITH BLADDER BIOPSY, BILATERAL RETROGRADE PYELOGRAM;  Surgeon: Crista Elliot, MD;  Location: [facility] ORS;  Service: Urology;  Laterality: Bilateral;  REQUESTING 45 MINS   CYSTOSCOPY WITH URETHRAL DILATATION N/A 05/04/2013   Procedure: CYSTOSCOPY WITH URETHRAL DILATATION;  Surgeon: Tia Alert, MD;  Location: [facility] NEURO ORS;  Service: Neurosurgery;  Laterality: N/A;  with insertion of foley catheter   EP study and ablation of VT  7/13   PVC focus mapped to the right coronary cusp of the aorta, limited ablation performed due to proximity of the focus to the right coronary  artery   EYE SURGERY     FINE NEEDLE ASPIRATION  07/05/2023   Procedure: FINE NEEDLE ASPIRATION (FNA) LINEAR;  Surgeon: Leslye Peer, MD;  Location: [facility] ENDOSCOPY;  Service: Pulmonary;;   LEFT HEART CATH AND CORONARY ANGIOGRAPHY N/A 11/07/2019   Procedure: LEFT HEART CATH AND CORONARY ANGIOGRAPHY;  Surgeon: Iran Ouch, MD;  Location: [facility] INVASIVE CV LAB;  Service: Cardiovascular;  Laterality: N/A;   LEFT HEART CATHETERIZATION WITH CORONARY ANGIOGRAM N/A 11/25/2011   Procedure: LEFT HEART CATHETERIZATION WITH CORONARY ANGIOGRAM;  Surgeon: Marykay Lex, MD;  Location: [facility] CATH LAB;  Service: Cardiovascular;  Laterality: N/A;   PACEMAKER IMPLANT N/A 07/12/2018   Procedure: PACEMAKER IMPLANT;  Surgeon: Thurmon Fair, MD;  Location: [facility] INVASIVE CV LAB;  Service: Cardiovascular;  Laterality: N/A;   POSTERIOR FUSION LUMBAR SPINE  1979   ROTATOR CUFF REPAIR  2000's   left   V-TACH ABLATION N/A 06/06/2012   Procedure: V-TACH ABLATION;  Surgeon: Hillis Range, MD;  Location: [facility] CATH LAB;  Service: Cardiovascular;  Laterality: N/A;   VIDEO BRONCHOSCOPY WITH  ENDOBRONCHIAL ULTRASOUND N/A 07/05/2023   Procedure: VIDEO BRONCHOSCOPY WITH ENDOBRONCHIAL ULTRASOUND;  Surgeon: Leslye Peer, MD;  Location: [facility] ENDOSCOPY;  Service: Pulmonary;  Laterality: N/A;    REVIEW OF SYSTEMS:   Review of Systems  Constitutional: Negative for appetite change, chills, fatigue, fever and unexpected weight change.  HENT:   Negative for mouth sores, nosebleeds, sore throat and trouble swallowing.   Eyes: Negative for eye problems and icterus.  Respiratory: Negative for cough, hemoptysis, shortness of breath and wheezing.   Cardiovascular: Negative for chest pain and leg swelling.  Gastrointestinal: Negative for abdominal pain, constipation, diarrhea, nausea and vomiting.  Genitourinary: Negative for bladder incontinence, difficulty urinating, dysuria, frequency and hematuria.   Musculoskeletal: Negative for back pain, gait problem, neck pain and neck stiffness.  Skin: Negative for itching and rash.  Neurological: Negative for dizziness, extremity weakness, gait problem, headaches, light-headedness and seizures.  Hematological: Negative for adenopathy. Does not bruise/bleed easily.  Psychiatric/Behavioral: Negative for confusion, depression and sleep disturbance. The patient is not nervous/anxious.     PHYSICAL EXAMINATION:  There were no vitals taken for this visit.  ECOG PERFORMANCE STATUS: {CHL ONC ECOG [id number]  Physical Exam  Constitutional: Oriented to person, place, and time and well-developed, well-nourished, and in no distress. No distress.  HENT:  Head: Normocephalic and atraumatic.  Mouth/Throat: Oropharynx is clear and moist. No oropharyngeal exudate.  Eyes: Conjunctivae are normal. Right eye exhibits no discharge. Left eye exhibits no discharge. No scleral icterus.  Neck: Normal range of motion. Neck supple.  Cardiovascular: Normal rate, regular rhythm, normal heart sounds and intact distal pulses.   Pulmonary/Chest: Effort normal and breath  sounds normal. No respiratory distress. No wheezes. No rales.  Abdominal: Soft. Bowel sounds are normal. Exhibits no distension and no mass. There is no tenderness.  Musculoskeletal: Normal range of motion. Exhibits no edema.  Lymphadenopathy:    No cervical adenopathy.  Neurological: Alert and oriented to person, place, and time. Exhibits normal muscle tone. Gait normal. Coordination normal.  Skin: Skin is warm and dry. No rash noted. Not diaphoretic. No erythema. No pallor.  Psychiatric: Mood, memory and judgment normal.  Vitals reviewed.  LABORATORY DATA: Lab Results  Component Value Date   WBC 4.2 06/14/2023   HGB 13.3 06/14/2023   HCT 39.2 06/14/2023   MCV 91.8 06/14/2023   PLT 136 (L) 06/14/2023      Chemistry  Component Value Date/Time   NA 141 06/14/2023 1352   NA 142 11/11/2022 1000   K 3.8 06/14/2023 1352   CL 108 06/14/2023 1352   CO2 28 06/14/2023 1352   BUN 16 06/14/2023 1352   BUN 18 11/11/2022 1000   CREATININE 1.50 (H) 06/14/2023 1352   CREATININE 1.37 (H) 01/24/2017 0950      Component Value Date/Time   CALCIUM 9.3 06/14/2023 1352   ALKPHOS 70 06/14/2023 1352   AST 15 06/14/2023 1352   ALT 10 06/14/2023 1352   BILITOT 0.7 06/14/2023 1352       RADIOGRAPHIC STUDIES:  NM PET Image Initial (PI) Skull Base To Thigh (F-18 FDG)  Result Date: 07/06/2023 CLINICAL DATA:  Initial treatment strategy for pulmonary nodule. EXAM: NUCLEAR MEDICINE PET SKULL BASE TO THIGH TECHNIQUE: 8.5 mCi F-18 FDG was injected intravenously. Full-ring PET imaging was performed from the skull base to thigh after the radiotracer. CT data was obtained and used for attenuation correction and anatomic localization. Fasting blood glucose: 100 mg/dl COMPARISON:  Chest CT 16/08/9603.  Chest CT 06/24/2023. FINDINGS: Mediastinal blood pool activity: SUV max 2.3 Liver activity: SUV max NA NECK: No hypermetabolic lymph nodes in the neck. Incidental CT findings: None. CHEST: Posterior left  apical lesion of concern on previous CT imaging is markedly hypermetabolic with SUV max = 12.4. Irregular lesion in the lingula also shows hypermetabolism with SUV max = 4.5. Hypermetabolic axillary lymph nodes identified bilaterally. Left axillary node measuring 11 mm short axis on image 61/4 demonstrates SUV max = 5.2. Right axillary lymph nodes demonstrate up to SUV max = 7.2. 13 mm short axis subcarinal lymph node demonstrates SUV max = 4.2 with focal hypermetabolic uptake in both hilar regions. Incidental CT findings: Coronary artery calcification is evident. Mild atherosclerotic calcification is noted in the wall of the thoracic aorta. Left permanent pacemaker noted. Centrilobular emphsyema noted. ABDOMEN/PELVIS: No abnormal hypermetabolic activity within the liver, pancreas, adrenal glands, or spleen. No hypermetabolic lymph nodes in the abdomen or pelvis. Incidental CT findings: Bilateral renal cysts noted with nonobstructing stones in the right kidney. There is moderate atherosclerotic calcification of the abdominal aorta without aneurysm. Mild diverticular changes left colon without diverticulitis. SKELETON: No focal hypermetabolic activity to suggest skeletal metastasis. Incidental CT findings: No worrisome lytic or sclerotic osseous abnormality. Lumbar fusion hardware evident. IMPRESSION: 1. Markedly hypermetabolic left apical lesion with additional hypermetabolic lesion in the lingula. Imaging features are highly suspicious for malignancy. 2. Hypermetabolic bilateral axillary and mediastinal lymph nodes concerning for metastatic disease. 3. No evidence for hypermetabolic metastatic disease in the abdomen or pelvis. Specifically, the nonspecific hyperenhancing splenic lesion seen on previous CT imaging is not hypermetabolic. 4. Aortic Atherosclerosis (ICD10-I70.0) and Emphysema (ICD10-J43.9). Electronically Signed   By: Kennith Center M.D.   On: 07/06/2023 08:53   DG Chest Port 1 View  Result Date:  07/05/2023 CLINICAL DATA:  Central microscopy and biopsy EXAM: PORTABLE CHEST - 1 VIEW COMPARISON:  06/02/2023 FINDINGS: Cardiomediastinal silhouette and pulmonary vasculature are within normal limits. Left upper lung mass again seen. There is no pneumothorax. Mild left basilar atelectasis is present. Right lung is well aerated. Left chest wall AICD is unchanged in configuration. Postsurgical changes thoracolumbar spine partially visualized. Surgical anchor seen in the left humeral head. IMPRESSION: No pneumothorax status post left lung biopsy. Electronically Signed   By: Acquanetta Belling M.D.   On: 07/05/2023 12:26   DG C-ARM BRONCHOSCOPY  Result Date: 07/05/2023 C-ARM BRONCHOSCOPY: Fluoroscopy was utilized by the  requesting physician.  No radiographic interpretation.   CT HEAD W & WO CONTRAST ( )  Result Date: 06/24/2023 CLINICAL DATA:  82 year old male with recent CT evidence of lung adenocarcinoma. Pacemaker. Staging. EXAM: CT HEAD WITHOUT AND WITH CONTRAST TECHNIQUE: Contiguous axial images were obtained from the base of the skull through the vertex without and with intravenous contrast. RADIATION DOSE REDUCTION: This exam was performed according to the departmental dose-optimization program which includes automated exposure control, adjustment of the mA and/or kV according to patient size and/or use of iterative reconstruction technique. CONTRAST:  75mL OMNIPAQUE IOHEXOL 300 MG/ML  SOLN COMPARISON:  Head CT without contrast 12/19/2019 and earlier. FINDINGS: Brain: Cerebral volume is stable since 2021 and within normal limits for age. No intracranial mass effect or midline shift. No ventriculomegaly. No acute intracranial hemorrhage identified. No cortically based acute infarct identified. Gray-white differentiation is stable and within normal limits. Solitary extra-axial semi circular mass with homogeneous enhancement over the left superior convexity is 18 x 16 x 9 mm, and was present in 2021, up to 14 mm  long axis at that time. This is much more conspicuous today with the injection of contrast (series 4, image 24), in retrospect can be identified since 2017. But there remains little to no mass effect on the adjacent left superior frontal gyrus (series 5, image 23) and no associated cerebral edema. No other abnormal intracranial enhancement or dural thickening identified. Vascular: Major intracranial vascular structures are enhancing as expected. Calcified atherosclerosis at the skull base. Skull: Stable.  No acute or suspicious osseous lesion. Sinuses/Orbits: Visualized paranasal sinuses and mastoids are stable and well aerated. Other: Stable, negative orbit and scalp soft tissues. IMPRESSION: 1.  No metastatic disease or acute intracranial abnormality. 2. 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 This is 2-4 mm larger since a 2021 18 mm now), with no associated cerebral edema or significant mass effect. Electronically Signed   By: Odessa Fleming M.D.   On: 06/24/2023 12:15   CT Super D Chest Wo Contrast  Result Date: 06/24/2023 CLINICAL DATA:  Follow-up pulmonary nodules.  * Tracking Code: BO * EXAM: CT CHEST WITHOUT CONTRAST TECHNIQUE: Multidetector CT imaging of the chest was performed using thin slice collimation for electromagnetic bronchoscopy planning purposes, without intravenous contrast. RADIATION DOSE REDUCTION: This exam was performed according to the departmental dose-optimization program which includes automated exposure control, adjustment of the mA and/or kV according to patient size and/or use of iterative reconstruction technique. COMPARISON:  06/02/2023 chest CT. FINDINGS: Cardiovascular: Normal heart size. No significant pericardial effusion/thickening. Three-vessel coronary atherosclerosis. Three lead left subclavian ICD with lead tips in the right atrium, right ventricular apex and coronary sinus. Atherosclerotic nonaneurysmal  thoracic aorta. Normal caliber pulmonary arteries. Mediastinum/Nodes: No significant thyroid nodules. Unremarkable esophagus. No axillary adenopathy. Stable mildly enlarged 1.0 cm right paratracheal node (series 2/image 25). Stable mildly enlarged 1.3 cm subcarinal node (series 2/image 31). Stable mildly enlarged 1.1 cm AP window node (series 2/image 22). No discrete hilar adenopathy on these noncontrast images. Lungs/Pleura: No pneumothorax. No pleural effusion. Mild centrilobular emphysema. Irregular solid 5.3 x 2.2 cm medial lingular lung mass (series 4/image 94), stable from 5.2 x 2.2 cm on 06/02/2023 CT and increased from 4.3 x 1.7 cm on 09/15/2020 CT using similar measurement technique. Subsolid 4.7 x 3.4 cm posterior apical left upper lobe lung mass with 2.2 cm solid component (series 4/image 34), unchanged from 06/02/2023 CT and increased from 4.2 x  3.3 cm with no measurable solid component on 09/15/2020 chest CT. Ground-glass 0.5 cm posterior right middle lobe pulmonary nodule (series 4/image 112), stable from 09/15/2020 CT. Solid 0.6 cm anterior left lower lobe pulmonary nodule (series 4/image 120), new from 09/15/2020 CT and unchanged from 06/02/2023 CT. Solid perifissural 0.5 cm left major fissure nodule (series 4/image 93) is stable from 09/15/2020 CT and considered benign. Upper abdomen: Cholecystectomy. Nonobstructing stones in the upper kidneys bilaterally, largest 7 mm on the right. Musculoskeletal: No aggressive appearing focal osseous lesions. Bilateral posterior spinal fusion hardware extending inferiorly from the T11 level. Moderate lower thoracic spondylosis. IMPRESSION: 1. Subsolid 4.7 cm posterior apical left upper lobe lung mass with 2.2 cm solid component, unchanged from recent 06/02/2023 CT and increased from 4.2 cm with no measurable solid component on 09/15/2020 chest CT, most compatible with primary bronchogenic adenocarcinoma. 2. Irregular solid 5.3 cm medial lingular lung mass, stable  from recent 06/02/2023 CT and increased from 4.3 cm on 09/15/2020 CT, suspicious for slow growing synchronous primary bronchogenic malignancy. 3. Solid 0.6 cm anterior left lower lobe pulmonary nodule, new from 09/15/2020 CT and unchanged from recent 06/02/2023 CT, cannot exclude ipsilateral pulmonary metastasis. 4. Mild bilateral mediastinal lymphadenopathy, nonspecific, metastatic adenopathy not excluded. 5. Three-vessel coronary atherosclerosis. 6. Nonobstructing bilateral nephrolithiasis. 7. Aortic Atherosclerosis (ICD10-I70.0) and Emphysema (ICD10-J43.9). Electronically Signed   By: Delbert Phenix M.D.   On: 06/24/2023 11:20     ASSESSMENT/PLAN:  This is a very pleasant 82 year old African-American male with suspicious stage IIIC/IV (T3, N3, M0/M1b) non-small cell lung cancer, adenocarcinoma. He presented with a left upper lobe lung mass and lesion in the lingula.  He also has bilateral mediastinal lymph nodes and hypermetabolic bilateral axillary lymph nodes.  Although it is unclear if the axillary lymph nodes are related to metastatic disease or inflammatory as he did have bilateral vaccines in his upper arms just prior to his PET scan. He was He was diagnosed in August 2024.   His molecular studies show ***  ***If the lymph node biopsy is negative, we may consider him for concurrent chemoradiation. If his lymph node is positive, then he likely will need systemic chemotherapy and possibly immunotherapy.   Dr. Arbutus Ped had a lengthly discussion with the patient today about her current condition and treatment options. The patient was given the option of a referral to hospice/palliative vs. Treatment with systemic chemotherapy with carboplatin for an AUC of 5, Alimta 500 mg/m, and Keytruda 200 mg IV every 3 weeks.  The patient is interested in proceeding with systemic chemotherapy.  She is expected to start her first dose of this treatment on __.  We discussed the adverse side effects of treatment  including but not limited to alopecia, myelosuppression, nausea and vomiting, peripheral neuropathy, liver or renal dysfunction as well as immunotherapy mediated adverse effects.   I will arrange for the patient to have a chemoeducation class prior to receiving her first cycle of chemotherapy.   We will arrange for the patient to have a B12 injection while in the clinic today.     I sent prescriptions for 1 mg folic acid p.o. daily as well as Compazine 10 mg every 6 hours as needed for nausea.   ***The patient will follow-up in 2 weeks for a one-week follow-up visit after completing her first cycle of chemotherapy.  The patient was advised to call immediately if she has any concerning symptoms in the interval. The patient voices understanding of current disease status and treatment options  and is in agreement with the current care plan. All questions were answered. The patient knows to call the clinic with any problems, questions or concerns. We can certainly see the patient much sooner if necessary    No orders of the defined types were placed in this encounter.    I spent {CHL ONC TIME VISIT - WUJWJ:[id number]} counseling the patient face to face. The total time spent in the appointment was {CHL ONC TIME VISIT - OZHYQ:[id number]}.  Turner Baillie L Lourine Alberico, PA-C 07/19/23

## 2023-07-21 ENCOUNTER — Encounter (HOSPITAL_COMMUNITY): Payer: Self-pay

## 2023-07-21 ENCOUNTER — Inpatient Hospital Stay: Payer: 59

## 2023-07-21 ENCOUNTER — Inpatient Hospital Stay: Payer: 59 | Admitting: Physician Assistant

## 2023-07-22 ENCOUNTER — Telehealth: Payer: Self-pay | Admitting: Internal Medicine

## 2023-07-22 ENCOUNTER — Other Ambulatory Visit: Payer: Self-pay

## 2023-07-22 NOTE — Telephone Encounter (Signed)
Called patient's spouse regarding upcoming patient's upcoming appointments, patient will be notified.

## 2023-07-22 NOTE — Progress Notes (Signed)
The proposed treatment discussed in conference is for discussion purpose only and is not a binding recommendation.  The patients have not been physically examined, or presented with their treatment options.  Therefore, final treatment plans cannot be decided.  

## 2023-07-26 ENCOUNTER — Encounter: Payer: Self-pay | Admitting: Primary Care

## 2023-07-26 ENCOUNTER — Other Ambulatory Visit: Payer: Self-pay | Admitting: Physician Assistant

## 2023-07-26 ENCOUNTER — Ambulatory Visit (INDEPENDENT_AMBULATORY_CARE_PROVIDER_SITE_OTHER): Payer: 59 | Admitting: Primary Care

## 2023-07-26 DIAGNOSIS — C3412 Malignant neoplasm of upper lobe, left bronchus or lung: Secondary | ICD-10-CM | POA: Diagnosis not present

## 2023-07-26 DIAGNOSIS — Z01818 Encounter for other preprocedural examination: Secondary | ICD-10-CM

## 2023-07-26 NOTE — Progress Notes (Signed)
@Patient  ID: [name], male    DOB: 10-29-1941, 82 y.o.   MRN: [id number]  Chief Complaint  Patient presents with   Follow-up    Referring provider: [name], MD  HPI: 82 year old male, former smoker/past medical history significant for COPD, left upper lobe lung mass, [place] acquired pneumonia, coronary artery disease, hypertension, A-fib, second-degree AV block, unstable angina, tachybradycardia syndrome.    07/26/2023- Interim hx  Patient presents today for follow-up. He underwent robotic assisted navigational bronchoscopy with endobronchial ultrasound by Dr. Delton Coombes on 07/05/2023.  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  Node biopsies were negative. His apt with oncology apt was cancelled 8/29. He needs core biopsy lymph scheduled for 9/4. He has a follow-up with Oncology Sept 11th  Breathing has been about the same since last OV. No major changes. He is using Breo once a day .He gets short of breath with activity, he recovers easily with rest. He does not wear oxygen. He has an occasional dry cough, no significant sputum production. No wheezing or chest tightness. He did not tolerate Allegra d/t lightheadedness. He is sleep and eating well. His weight remains stable    Allergies  Allergen Reactions   Paclitaxel Other (See Comments) and Hypertension    Pt reported chest pain, back pain, and SOB during first infusion (see notes from 9/23). Infusion discontinued.    Allegra [Fexofenadine] Other (See Comments)    Makes him dizzy and feels like he will fall.    Immunization History  Administered Date(s) Administered   Fluad Quad(high Dose 65+) 09/11/2019   Influenza, High Dose Seasonal PF 08/30/2017   Influenza-Unspecified 09/22/2018   Tdap 11/17/2016, 05/06/2020   Zoster Recombinant(Shingrix) 02/08/2018, 08/21/2018    Past Medical History:  Diagnosis Date   AICD (automatic cardioverter/defibrillator) present    CAD (coronary artery disease) 80% stenosis diag  of the LAD, 30% in OM2 branch of LCX in 2009    a. Nonobstructive CAD by cath 11/2011 with the exception of the pre-existing diagonal branch #2 lesion.   Chronic systolic CHF (congestive heart failure) (HCC)    CKD (chronic kidney disease) stage 3, GFR 30-59 ml/min (HCC)    Colon polyp, hyperplastic    History of kidney stones    History of stress test 06/01/2012   Normal myocardial perfusion study. compared to the previous study there is no significant change. this is a low risk scan   Hypertension    Legally blind    "both eyes"   Myocardial infarction [facility]) 11/22/2011   NICM (nonischemic cardiomyopathy) (HCC)    a. Remote hx of dilated NICM with EF ranging 20-45%, including normal EF by echo (55-60%) in 2014.   Peripheral arterial disease (HCC)    a. 06/2014: ABI right 0.99, left 1.2, LE dopplers revealing an occluded right posterior tibial. As symptoms were not felt r/t claudication, no further w/u at the time.   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 Requiring discontinuation of BB dose.   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    Tobacco History: Social History   Tobacco Use  Smoking Status Former   Current packs/day: 0.00   Types: Cigarettes   Start date: 11/22/1949   Quit date: 11/23/1999   Years since quitting: 23.7   Passive exposure: Current  Smokeless Tobacco Never   Counseling given: Not Answered   Outpatient Medications Prior  to Visit  Medication Sig Dispense Refill   albuterol (VENTOLIN HFA) 108 (90 Base) MCG/ACT inhaler Inhale 1-2 puffs into the lungs every 6 (six) hours as needed for wheezing or shortness of breath.     apixaban (ELIQUIS) 2.5 MG TABS tablet Take 1 tablet (2.5 mg total) by mouth 2 (two) times daily. Okay to restart this medication on 07/06/2023     BREO ELLIPTA 100-25 MCG/INH AEPB Inhale 1 puff into the lungs every  morning. (Patient not taking: Reported on 08/04/2023)     brimonidine (ALPHAGAN) 0.2 % ophthalmic solution Place 1 drop into both eyes 2 (two) times daily.     cycloSPORINE (RESTASIS) 0.05 % ophthalmic emulsion Place 1 drop into both eyes 2 (two) times daily.     diclofenac Sodium (VOLTAREN) 1 % GEL Apply 2 g topically 4 (four) times daily. (Patient not taking: Reported on 08/04/2023) 100 g 0   dorzolamide-timolol (COSOPT) 22.3-6.8 MG/ML ophthalmic solution Place 1 drop into both eyes 2 (two) times daily.     furosemide (LASIX) 20 MG tablet Take 1 tablet (20 mg total) by mouth See admin instructions. Take 20 mg by mouth EVERY OTHER MORNING     isosorbide mononitrate (IMDUR) 30 MG 24 hr tablet Take 30 mg by mouth daily. (Patient not taking: Reported on 08/04/2023)     metoprolol succinate (TOPROL-XL) 100 MG 24 hr tablet Take 1 tablet (100 mg total) by mouth in the morning. Take with or immediately following a meal.     nitroGLYCERIN (NITROSTAT) 0.4 MG SL tablet Place 1 tablet (0.4 mg total) under the tongue every 5 (five) minutes as needed for chest pain. 25 tablet 1   ROCKLATAN 0.02-0.005 % SOLN Apply 1 drop to eye at bedtime.     sacubitril-valsartan (ENTRESTO) 24-26 MG Take 1 tablet by mouth 2 (two) times daily. 60 tablet 11   spironolactone (ALDACTONE) 25 MG tablet Take 12.5 mg by mouth daily.     fexofenadine (ALLEGRA) 180 MG tablet Take 180 mg by mouth daily as needed for allergies or rhinitis.     No facility-administered medications prior to visit.   Review of Systems  Review of Systems  Constitutional: Negative.   HENT: Negative.    Respiratory:  Negative for cough, shortness of breath and wheezing.        DOE  Cardiovascular: Negative.    Physical Exam  BP 120/60 (BP Location: Left Arm, Patient Position: Sitting, Cuff Size: Normal)   Pulse 76   Temp (!) 97.5 F (36.4 C) (Oral)   Ht 5\' 7"  (1.702 m)   Wt 170 lb 12.8 oz (77.5 kg)   SpO2 97%   BMI 26.75 kg/m  Physical  Exam Constitutional:      General: He is not in acute distress.    Appearance: Normal appearance. He is not ill-appearing.  HENT:     Head: Normocephalic and atraumatic.  Cardiovascular:     Rate and Rhythm: Normal rate and regular rhythm.     Comments: ICD in place Pulmonary:     Effort: Pulmonary effort is normal.     Breath sounds: Normal breath sounds.     Comments: CTA Musculoskeletal:        General: Normal range of motion.  Skin:    General: Skin is warm and dry.  Neurological:     General: No focal deficit present.     Mental Status: He is alert and oriented to person, place, and time. Mental status is at baseline.  Psychiatric:        Mood and Affect: Mood normal.        Behavior: Behavior normal.        Thought Content: Thought content normal.        Judgment: Judgment normal.      Lab Results:  CBC    Component Value Date/Time   WBC 4.2 08/15/2023 0921   WBC 4.4 06/02/2023 1052   RBC 4.24 08/15/2023 0921   HGB 13.1 08/15/2023 0921   HGB 13.4 01/12/2022 0916   HCT 38.9 (L) 08/15/2023 0921   HCT 39.8 01/12/2022 0916   PLT 139 (L) 08/15/2023 0921   PLT 133 (L) 01/12/2022 0916   MCV 91.7 08/15/2023 0921   MCV 91 01/12/2022 0916   MCH 30.9 08/15/2023 0921   MCHC 33.7 08/15/2023 0921   RDW 12.3 08/15/2023 0921   RDW 11.8 01/12/2022 0916   LYMPHSABS 1.4 08/15/2023 0921   MONOABS 0.4 08/15/2023 0921   EOSABS 0.2 08/15/2023 0921   BASOSABS 0.0 08/15/2023 0921    BMET    Component Value Date/Time   NA 139 08/15/2023 0921   NA 142 11/11/2022 1000   K 3.9 08/15/2023 0921   CL 109 08/15/2023 0921   CO2 25 08/15/2023 0921   GLUCOSE 121 (H) 08/15/2023 0921   BUN 21 08/15/2023 0921   BUN 18 11/11/2022 1000   CREATININE 1.69 (H) 08/15/2023 0921   CREATININE 1.37 (H) 01/24/2017 0950   CALCIUM 9.5 08/15/2023 0921   GFRNONAA 40 (L) 08/15/2023 0921   GFRAA 49 (L) 04/19/2020 1554    BNP    Component Value Date/Time   BNP 399.4 (H) 04/29/2023 1444     ProBNP    Component Value Date/Time   PROBNP 155.4 (H) 03/19/2013 1749    Imaging: [place] CORE BIOPSY (LYMPH NODES)  Result Date: 07/27/2023 INDICATION: PET positive axillary adenopathy, newly diagnosed lung cancer EXAM: ULTRASOUND GUIDED CORE BIOPSY OF LEFT AXILLARY ADENOPATHY MEDICATIONS: 1% LIDOCAINE LOW ANESTHESIA/SEDATION: Versed 1.0mg  IV; Fentanyl IV; Moderate Sedation Time:  10 MINUTE The patient was continuously monitored during the procedure by the interventional radiology nurse under my direct supervision. FLUOROSCOPY: Fluoroscopy Time: None. COMPLICATIONS: None immediate. PROCEDURE: The procedure, risks, benefits, and alternatives were explained to the patient. Questions regarding the procedure were encouraged and answered. The patient understands and consents to the procedure. Previous imaging reviewed. Preliminary ultrasound performed. Left axillary adenopathy was localized and correlated with the PET-CT. Overlying skin marked. Under sterile conditions local anesthesia, the 18 gauge core biopsy needle was advanced to the adenopathy. 18 gauge core biopsies obtained of the cortex. Needle position confirmed with ultrasound. Samples placed in saline. Postprocedure imaging demonstrates no hemorrhage or hematoma. Patient tolerated biopsy well. FINDINGS: Imaging confirms needle placement in the left axillary adenopathy for core biopsy IMPRESSION: Successful ultrasound left axillary adenopathy core biopsy Electronically Signed   By: Judie Petit.  Shick M.D.   On: 07/27/2023 15:29     Assessment & Plan:   COPD (chronic obstructive pulmonary disease) (HCC) Stable; No changes to plan today Continue Breo Ellipta 1 puff daily every morning (rinse mouth after use)  Follow-up: 4 months with Dr. Delton Coombes or sooner if needed   Primary adenocarcinoma of upper lobe of left lung [facility]) - He underwent robotic assisted navigational bronchoscopy with endobronchial ultrasound by Dr. Delton Coombes on 07/05/2023.   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  Node biopsies were negative.  - Advised patient keep appointment for lymph biopsy on 07/27/2023 and appointment with  oncology on 08/03/2023   Glenford Bayley, NP 08/22/2023

## 2023-07-26 NOTE — Patient Instructions (Signed)
No changes to plan today Continue Breo Ellipta 1 puff daily every morning (rinse mouth after use) Keep appointment for lymph biopsy on 07/27/2023 Keep appointment with oncology on 08/03/2023  Follow-up: 4 months with [name] or sooner if needed

## 2023-07-27 ENCOUNTER — Ambulatory Visit (HOSPITAL_COMMUNITY)
Admission: RE | Admit: 2023-07-27 | Discharge: 2023-07-27 | Disposition: A | Discharge status: Home / Self Care | Payer: 59 | Source: Ambulatory Visit | Attending: Physician Assistant | Admitting: Physician Assistant

## 2023-07-27 ENCOUNTER — Encounter (HOSPITAL_COMMUNITY): Payer: Self-pay

## 2023-07-27 ENCOUNTER — Other Ambulatory Visit: Payer: Self-pay

## 2023-07-27 DIAGNOSIS — D4989 Neoplasm of unspecified behavior of other specified sites: Secondary | ICD-10-CM | POA: Insufficient documentation

## 2023-07-27 DIAGNOSIS — C3490 Malignant neoplasm of unspecified part of unspecified bronchus or lung: Secondary | ICD-10-CM | POA: Diagnosis not present

## 2023-07-27 DIAGNOSIS — R590 Localized enlarged lymph nodes: Secondary | ICD-10-CM | POA: Diagnosis not present

## 2023-07-27 DIAGNOSIS — Z01818 Encounter for other preprocedural examination: Secondary | ICD-10-CM | POA: Insufficient documentation

## 2023-07-27 DIAGNOSIS — R222 Localized swelling, mass and lump, trunk: Secondary | ICD-10-CM | POA: Insufficient documentation

## 2023-07-27 DIAGNOSIS — R591 Generalized enlarged lymph nodes: Secondary | ICD-10-CM | POA: Diagnosis not present

## 2023-07-27 MED ORDER — LIDOCAINE HCL 1 % IJ SOLN
INTRAMUSCULAR | Status: AC
  Filled 2023-07-27: qty 20

## 2023-07-27 MED ORDER — MIDAZOLAM HCL 2 MG/2ML IJ SOLN
INTRAMUSCULAR | Status: AC | PRN
Start: 2023-07-27 — End: 2023-07-27
  Administered 2023-07-27 (×2): 1 mg via INTRAVENOUS

## 2023-07-27 MED ORDER — FENTANYL CITRATE (PF) 100 MCG/2ML IJ SOLN
INTRAMUSCULAR | Status: AC | PRN
Start: 2023-07-27 — End: 2023-07-27
  Administered 2023-07-27 (×2): 50 ug via INTRAVENOUS

## 2023-07-27 MED ORDER — MIDAZOLAM HCL 2 MG/2ML IJ SOLN
INTRAMUSCULAR | Status: AC
  Filled 2023-07-27: qty 2

## 2023-07-27 MED ORDER — FENTANYL CITRATE (PF) 100 MCG/2ML IJ SOLN
INTRAMUSCULAR | Status: AC
  Filled 2023-07-27: qty 2

## 2023-07-27 MED ORDER — SODIUM CHLORIDE 0.9 % IV SOLN: INTRAVENOUS | Status: DC

## 2023-07-27 NOTE — Discharge Instructions (Signed)
Please call Interventional Radiology clinic [phone] with any questions or concerns.   You may remove your dressing and shower tomorrow.    Needle Biopsy, Care After These instructions tell you how to care for yourself after your procedure. Your doctor may also give you more specific instructions. Call your doctor if youhave any problems or questions. What can I expect after the procedure? After the procedure, it is common to have: Soreness. Bruising. Mild pain. Follow these instructions at home:  Return to your normal activities as told by your doctor. Ask your doctor what activities are safe for you. Take over-the-counter and prescription medicines only as told by your doctor. Wash your hands with soap and water before you change your bandage (dressing). If you cannot use soap and water, use hand sanitizer. Follow instructions from your doctor about: How to take care of your puncture site. When and how to change your bandage. When to remove your bandage. Check your puncture site every day for signs of infection. Watch for: Redness, swelling, or pain. Fluid or blood. Pus or a bad smell. Warmth. Do not take baths, swim, or use a hot tub until your doctor approves. Ask your doctor if you may take showers. You may only be allowed to take sponge baths. Keep all follow-up visits as told by your doctor. This is important. Contact a doctor if you have: A fever. Redness, swelling, or pain at the puncture site, and it lasts longer than a few days. Fluid, blood, or pus coming from the puncture site. Warmth coming from the puncture site. Get help right away if: You have a lot of bleeding from the puncture site. Summary After the procedure, it is common to have soreness, bruising, or mild pain at the puncture site. Check your puncture site every day for signs of infection, such as redness, swelling, or pain. Get help right away if you have severe bleeding from your puncture  site. This information is not intended to replace advice given to you by your health care provider. Make sure you discuss any questions you have with your healthcare provider. Document Revised: 05/08/2020 Document Reviewed: 05/08/2020 Elsevier Patient Education  2022 [employer] Inc[employer]   Moderate Conscious Sedation, Adult, Care After This sheet gives you information about how to care for yourself after your procedure. Your health care provider may also give you more specific instructions. If you have problems or questions, contact your health care provider. What can I expect after the procedure? After the procedure, it is common to have: Sleepiness for several hours. Impaired judgment for several hours. Difficulty with balance. Vomiting if you eat too soon. Follow these instructions at home: For the time period you were told by your health care provider: Rest. Do not participate in activities where you could fall or become injured. Do not drive or use machinery. Do not drink alcohol. Do not take sleeping pills or medicines that cause drowsiness. Do not make important decisions or sign legal documents. Do not take care of children on your own.      Eating and drinking Follow the diet recommended by your health care provider. Drink enough fluid to keep your urine pale yellow. If you vomit: Drink water, juice, or soup when you can drink without vomiting. Make sure you have little or no nausea before eating solid foods.   General instructions Take over-the-counter and prescription medicines only as told by your health care provider. Have a responsible adult stay with you for the time you are told.   It is important to have someone help care for you until you are awake and alert. Do not smoke. Keep all follow-up visits as told by your health care provider. This is important. Contact a health care provider if: You are still sleepy or having trouble with balance after 24 hours. You feel  light-headed. You keep feeling nauseous or you keep vomiting. You develop a rash. You have a fever. You have redness or swelling around the IV site. Get help right away if: You have trouble breathing. You have new-onset confusion at home. Summary After the procedure, it is common to feel sleepy, have impaired judgment, or feel nauseous if you eat too soon. Rest after you get home. Know the things you should not do after the procedure. Follow the diet recommended by your health care provider and drink enough fluid to keep your urine pale yellow. Get help right away if you have trouble breathing or new-onset confusion at home. This information is not intended to replace advice given to you by your health care provider. Make sure you discuss any questions you have with your health care provider. Document Revised: 03/07/2020 Document Reviewed: 10/04/2019 Elsevier Patient Education  2021 [employer] Inc.     

## 2023-07-27 NOTE — H&P (Signed)
Chief Complaint: Patient was seen in consultation today for LN biopsy   Referring Physician(s): Heilingoetter,Cassandra L  Supervising Physician: Ruel Favors  Patient Status: [facility] - Out-pt  History of Present Illness: [name] is a 82 y.o. male being worked up for lung cancer. 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 Additional tissue sampling needed for further testing. PET finds hypermetabolic axillary adenopathy. Pt referred to IR for image guided LN biopsy.  PMHx, meds, labs, imaging, allergies reviewed. Feels well, no recent fevers, chills, illness. Has been NPO today as directed.   Past Medical History:  Diagnosis Date   AICD (automatic cardioverter/defibrillator) present    CAD (coronary artery disease) 80% stenosis diag of the LAD, 30% in OM2 branch of LCX in 2009    a. Nonobstructive CAD by cath 11/2011 with the exception of the pre-existing diagonal branch #2 lesion.   Chronic systolic CHF (congestive heart failure) (HCC)    CKD (chronic kidney disease) stage 3, GFR 30-59 ml/min (HCC)    Colon polyp, hyperplastic    History of kidney stones    History of stress test 06/01/2012   Normal myocardial perfusion study. compared to the previous study there is no significant change. this is a low risk scan   Hypertension    Legally blind    "both eyes"   Myocardial infarction [facility]) 11/22/2011   NICM (nonischemic cardiomyopathy) (HCC)    a. Remote hx of dilated NICM with EF ranging 20-45%, including normal EF by echo (55-60%) in 2014.   Peripheral arterial disease (HCC)    a. 06/2014: ABI right 0.99, left 1.2, LE dopplers revealing an occluded right posterior tibial. As symptoms were not felt r/t claudication, no further w/u at the time.   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 Requiring discontinuation of BB dose.   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/2015    Past Surgical History:  Procedure Laterality Date   BACK SURGERY     BIV UPGRADE N/A 11/08/2019   Procedure: BIV UPGRADE;  Surgeon: Marinus Maw, MD;  Location: [facility] INVASIVE CV LAB;  Service: Cardiovascular;  Laterality: N/A;   BRONCHIAL BIOPSY  07/05/2023   Procedure: BRONCHIAL BIOPSIES;  Surgeon: Leslye Peer, MD;  Location: [facility] ENDOSCOPY;  Service: Pulmonary;;   BRONCHIAL BRUSHINGS  07/05/2023   Procedure: BRONCHIAL BRUSHINGS;  Surgeon: Leslye Peer, MD;  Location: [facility] ENDOSCOPY;  Service: Pulmonary;;   BRONCHIAL NEEDLE ASPIRATION BIOPSY  07/05/2023   Procedure: BRONCHIAL NEEDLE ASPIRATION BIOPSIES;  Surgeon: Leslye Peer, MD;  Location: [facility] ENDOSCOPY;  Service: Pulmonary;;   BRONCHIAL WASHINGS  07/05/2023   Procedure: BRONCHIAL WASHINGS;  Surgeon: Leslye Peer, MD;  Location: [facility] ENDOSCOPY;  Service: Pulmonary;;   CARDIAC CATHETERIZATION  11/2011   CARDIAC CATHETERIZATION  11/2011   didn't demonstrate high grade obstructive disease to account for his LV dysfunction.   CATARACT EXTRACTION, BILATERAL  1990's   CYSTOSCOPY     CYSTOSCOPY WITH BIOPSY Bilateral 08/03/2021   Procedure: CYSTOSCOPY WITH BLADDER BIOPSY, BILATERAL RETROGRADE PYELOGRAM;  Surgeon: Crista Elliot, MD;  Location: [facility] ORS;  Service: Urology;  Laterality: Bilateral;  REQUESTING 45 MINS   CYSTOSCOPY WITH URETHRAL DILATATION N/A 05/04/2013   Procedure: CYSTOSCOPY WITH URETHRAL DILATATION;  Surgeon: Tia Alert, MD;  Location: [facility] NEURO ORS;  Service: Neurosurgery;  Laterality: N/A;  with insertion of foley catheter   EP study and ablation of VT  7/13   PVC focus mapped to the right coronary cusp of the aorta, limited ablation performed due to proximity of the focus to the right coronary artery   EYE SURGERY     FINE NEEDLE ASPIRATION  07/05/2023   Procedure: FINE NEEDLE ASPIRATION (FNA) LINEAR;  Surgeon: Leslye Peer, MD;  Location: [facility] ENDOSCOPY;   Service: Pulmonary;;   LEFT HEART CATH AND CORONARY ANGIOGRAPHY N/A 11/07/2019   Procedure: LEFT HEART CATH AND CORONARY ANGIOGRAPHY;  Surgeon: Iran Ouch, MD;  Location: [facility] INVASIVE CV LAB;  Service: Cardiovascular;  Laterality: N/A;   LEFT HEART CATHETERIZATION WITH CORONARY ANGIOGRAM N/A 11/25/2011   Procedure: LEFT HEART CATHETERIZATION WITH CORONARY ANGIOGRAM;  Surgeon: Marykay Lex, MD;  Location: [facility] CATH LAB;  Service: Cardiovascular;  Laterality: N/A;   PACEMAKER IMPLANT N/A 07/12/2018   Procedure: PACEMAKER IMPLANT;  Surgeon: Thurmon Fair, MD;  Location: [facility] INVASIVE CV LAB;  Service: Cardiovascular;  Laterality: N/A;   POSTERIOR FUSION LUMBAR SPINE  1979   ROTATOR CUFF REPAIR  2000's   left   V-TACH ABLATION N/A 06/06/2012   Procedure: V-TACH ABLATION;  Surgeon: Hillis Range, MD;  Location: [facility] CATH LAB;  Service: Cardiovascular;  Laterality: N/A;   VIDEO BRONCHOSCOPY WITH ENDOBRONCHIAL ULTRASOUND N/A 07/05/2023   Procedure: VIDEO BRONCHOSCOPY WITH ENDOBRONCHIAL ULTRASOUND;  Surgeon: Leslye Peer, MD;  Location: [facility] ENDOSCOPY;  Service: Pulmonary;  Laterality: N/A;    Allergies: Allegra [fexofenadine]  Medications: Prior to Admission medications   Medication Sig Start Date End Date Taking? Authorizing Provider  albuterol (VENTOLIN HFA) 108 (90 Base) MCG/ACT inhaler Inhale 1-2 puffs into the lungs every 6 (six) hours as needed for wheezing or shortness of breath.   Yes [provider]  apixaban (ELIQUIS) 2.5 MG TABS tablet Take 1 tablet (2.5 mg total) by mouth 2 (two) times daily. Okay to restart this medication on 07/06/2023 07/05/23  Yes Byrum, Les Pou, MD  BREO ELLIPTA 100-25 MCG/INH AEPB Inhale 1 puff into the lungs every morning. 05/07/20  Yes [provider]  brimonidine (ALPHAGAN) 0.2 % ophthalmic solution Place 1 drop into both eyes 2 (two) times daily. 03/29/23  Yes [provider]  cycloSPORINE (RESTASIS) 0.05 % ophthalmic emulsion Place 1  drop into both eyes 2 (two) times daily.   Yes [provider]  diclofenac Sodium (VOLTAREN) 1 % GEL Apply 2 g topically 4 (four) times daily. 06/02/23  Yes Long, Arlyss Repress, MD  dorzolamide-timolol (COSOPT) 22.3-6.8 MG/ML ophthalmic solution Place 1 drop into both eyes 2 (two) times daily. 03/14/20  Yes [provider]  isosorbide mononitrate (IMDUR) 30 MG 24 hr tablet Take 30 mg by mouth daily. 02/24/20  Yes [provider]  metoprolol succinate (TOPROL-XL) 100 MG 24 hr tablet Take 1 tablet (100 mg total) by mouth in the morning. Take with or immediately following a meal. 07/05/23  Yes Byrum, Les Pou, MD  ROCKLATAN 0.02-0.005 % SOLN Apply 1 drop to eye at bedtime.   Yes [provider]  sacubitril-valsartan (ENTRESTO) 24-26 MG Take 1 tablet by mouth 2 (two) times daily. 10/04/22  Yes Croitoru, Mihai, MD  spironolactone (ALDACTONE) 25 MG tablet Take 12.5 mg by mouth daily. 08/31/22  Yes [provider]  furosemide (LASIX) 20 MG tablet Take 1 tablet (20 mg total) by mouth See admin instructions. Take 20 mg by mouth EVERY OTHER MORNING 07/05/23   Leslye Peer, MD  nitroGLYCERIN (NITROSTAT) 0.4 MG SL tablet Place 1 tablet (0.4 mg total) under the tongue every 5 (five) minutes as needed for chest pain. 11/11/22   Croitoru, Mihai, MD     Family History  Problem Relation Age of Onset   Asthma Mother    Other Other        Unsure if any heart disease in his family.    Social History   Socioeconomic History   Marital status: Legally Separated    Spouse name: Not on file   Number of children: 3   Years of education: Not on file   Highest education level: Not on file  Occupational History   Occupation: Retired    Associate Professor: RETIRED  Tobacco Use   Smoking status: Former    Current packs/day: 0.00    Types: Cigarettes    Start date: 11/22/1949    Quit date: 11/23/1999    Years since quitting: 23.6    Passive exposure: Current   Smokeless tobacco: Never   Vaping Use   Vaping status: Never Used  Substance and Sexual Activity   Alcohol use: No    Comment: 05/26/12 "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; stopped drinking in the early 1980's"   Drug use: No   Sexual activity: Yes  Other Topics Concern   Not on file  Social History Narrative   Not on file   Social Determinants of Health   Financial Resource Strain: Not on file  Food Insecurity: Unknown (04/30/2023)   Hunger Vital Sign    Worried About Running Out of Food in the Last Year: Patient declined    Ran Out of Food in the Last Year: Never true  Transportation Needs: No Transportation Needs (04/30/2023)   PRAPARE - Administrator, Civil Service (Medical): No    Lack of Transportation (Non-Medical): No  Physical Activity: Not on file  Stress: Not on file  Social Connections: Not on file    Review of Systems: A 12 point ROS discussed and pertinent positives are indicated in the HPI above.  All other systems are negative.  Review of Systems  Vital Signs: BP (!) 165/89   Pulse 69   Temp 99.5 F (37.5 C) (Oral)   Resp 18   Ht 5\' 7"  (1.702 m)   Wt 170 lb 13.7 oz (77.5 kg)   SpO2 96%   BMI 26.76 kg/m   Physical Exam  Imaging: NM PET Image Initial (PI) Skull Base To Thigh (F-18 FDG)  Result Date: 07/06/2023 CLINICAL DATA:  Initial treatment strategy for pulmonary nodule. EXAM: NUCLEAR MEDICINE PET SKULL BASE TO THIGH TECHNIQUE: 8.5 mCi F-18 FDG was injected intravenously. Full-ring PET imaging was performed from the skull base to thigh after the radiotracer. CT data was obtained and used for attenuation correction and anatomic localization. Fasting blood glucose: 100 mg/dl COMPARISON:  Chest CT 16/08/9603.  Chest CT 06/24/2023. FINDINGS: Mediastinal blood pool activity: SUV max 2.3 Liver activity: SUV max NA NECK: No hypermetabolic lymph nodes in the neck. Incidental CT findings: None. CHEST: Posterior left apical lesion of concern on previous CT imaging is markedly hypermetabolic with  SUV max = 12.4. Irregular lesion in the lingula also shows hypermetabolism with SUV max = 4.5. Hypermetabolic axillary lymph nodes identified bilaterally. Left axillary node measuring 11 mm short axis on image 61/4 demonstrates SUV max = 5.2. Right axillary lymph nodes demonstrate up to SUV max = 7.2. 13 mm short axis subcarinal lymph node demonstrates SUV max = 4.2 with  focal hypermetabolic uptake in both hilar regions. Incidental CT findings: Coronary artery calcification is evident. Mild atherosclerotic calcification is noted in the wall of the thoracic aorta. Left permanent pacemaker noted. Centrilobular emphsyema noted. ABDOMEN/PELVIS: No abnormal hypermetabolic activity within the liver, pancreas, adrenal glands, or spleen. No hypermetabolic lymph nodes in the abdomen or pelvis. Incidental CT findings: Bilateral renal cysts noted with nonobstructing stones in the right kidney. There is moderate atherosclerotic calcification of the abdominal aorta without aneurysm. Mild diverticular changes left colon without diverticulitis. SKELETON: No focal hypermetabolic activity to suggest skeletal metastasis. Incidental CT findings: No worrisome lytic or sclerotic osseous abnormality. Lumbar fusion hardware evident. IMPRESSION: 1. Markedly hypermetabolic left apical lesion with additional hypermetabolic lesion in the lingula. Imaging features are highly suspicious for malignancy. 2. Hypermetabolic bilateral axillary and mediastinal lymph nodes concerning for metastatic disease. 3. No evidence for hypermetabolic metastatic disease in the abdomen or pelvis. Specifically, the nonspecific hyperenhancing splenic lesion seen on previous CT imaging is not hypermetabolic. 4. Aortic Atherosclerosis (ICD10-I70.0) and Emphysema (ICD10-J43.9). Electronically Signed   By: Kennith Center M.D.   On: 07/06/2023 08:53   DG Chest Port 1 View  Result Date: 07/05/2023 CLINICAL DATA:  Central microscopy and biopsy EXAM: PORTABLE CHEST - 1  VIEW COMPARISON:  06/02/2023 FINDINGS: Cardiomediastinal silhouette and pulmonary vasculature are within normal limits. Left upper lung mass again seen. There is no pneumothorax. Mild left basilar atelectasis is present. Right lung is well aerated. Left chest wall AICD is unchanged in configuration. Postsurgical changes thoracolumbar spine partially visualized. Surgical anchor seen in the left humeral head. IMPRESSION: No pneumothorax status post left lung biopsy. Electronically Signed   By: Acquanetta Belling M.D.   On: 07/05/2023 12:26   DG C-ARM BRONCHOSCOPY  Result Date: 07/05/2023 C-ARM BRONCHOSCOPY: Fluoroscopy was utilized by the requesting physician.  No radiographic interpretation.    Labs:  CBC: Recent Labs    04/29/23 1444 04/30/23 0538 06/02/23 1052 06/14/23 1352  WBC 4.8 4.3 4.4 4.2  HGB 14.1 14.1 14.4 13.3  HCT 42.9 42.3 43.9 39.2  PLT 140* 140* 142* 136*    COAGS: No results for input(s): "INR", "APTT" in the last 8760 hours.  BMP: Recent Labs    04/29/23 1444 04/30/23 0538 06/02/23 1052 06/14/23 1352  NA 139 137 141 141  K 4.1 3.6 3.8 3.8  CL 106 104 110 108  CO2 26 23 24 28   GLUCOSE 107* 105* 104* 97  BUN 18 18 16 16   CALCIUM 9.2 9.1 9.1 9.3  CREATININE 1.54* 1.41* 1.51* 1.50*  GFRNONAA 44* 49* 45* 46*    LIVER FUNCTION TESTS: Recent Labs    06/02/23 1052 06/14/23 1352  BILITOT 0.5 0.7  AST 20 15  ALT 14 10  ALKPHOS 63 70  PROT 7.5 6.8  ALBUMIN 3.6 3.8     Assessment and Plan: Axillary adenopathy Left upper lobe mass, bx c/w adenocarcinoma but need additional tissue. Plan for axillary LN biopsy. Labs reviewed. Risks and benefits of LN biopsy was discussed with the patient and/or patient's family including, but not limited to bleeding, infection, damage to adjacent structures or low yield requiring additional tests.  All of the questions were answered and there is agreement to proceed.  Consent signed and in chart.    Electronically  Signed: Brayton El, PA-C 07/27/2023, 12:16 PM   I spent a total of 20 minutes in face to face in clinical consultation, greater than 50% of which was counseling/coordinating care for LN bx

## 2023-07-27 NOTE — Procedures (Signed)
Interventional Radiology Procedure Note  Procedure: [place] CORE BX LEFT AX NODE    Complications: None  Estimated Blood Loss:  MIN  Findings: 18 G CORES IN SALINE    Sharen Counter, MD

## 2023-07-28 LAB — SURGICAL PATHOLOGY

## 2023-07-31 NOTE — Progress Notes (Unsigned)
[facility] OFFICE PROGRESS NOTE  [name], MD [address] [place] [address]  DIAGNOSIS: Stage IIIC/IV (T3, N3, M0) non-small cell lung cancer, adenocarcinoma. He presented with a left upper lobe spiculated mass in addition to lingular lesion and suspicious mediastinal lymphadenopathy He also has bilateral hypermetabolic axillary lymph nodes which could be related to metastatic disease versus inflammatory as the patient did have vaccines in both of his arms the week prior to his PET scan.    Molecular Studies: His PD-L1 expression was 1% and he has no actionable mutations.  PRIOR THERAPY: None  CURRENT THERAPY: Concurrent chemoradiation with carboplatin for an AUC of 2 and paclitaxel 45 mg/m.  First dose expected on 08/15/23  INTERVAL HISTORY: [name] 82 y.o. male returns to the clinic today for a follow up visit accompanied by his wife. The patient was last seen by Dr. Arbutus Ped and myself on 07/06/23. The results of his bronchoscopy were pending at that time. We ordered molecular studies. He was scheduled for US biopsy of the axillary lymph nodes on 07/27/23.   If the lymph node biopsy is negative, discussed considering him for concurrent chemoradiation. If his lymph node is positive, then he likely will need systemic chemotherapy and possibly immunotherapy.  The final pathology of the lymph node biopsy was negative for malignancy  Since last being seen, he denies any major changes in his health.  He denies any fever, chills, or unexplained weight loss.  He reports that he "always" has night sweats.  He reports his appetite is good.  He reports his breathing is "the same".  He denies any significant dyspnea but may have some on exertion "sometimes".  The patient does exercise on a stationary bike and walks.  He denies any significant cough, chest pain, or hemoptysis.  Denies any nausea, vomiting, diarrhea, or constipation.  Denies any headache or visual changes.   He sometimes may get dizzy if he stands up too fast.  He is here today for evaluation and to discuss the next steps in his care.     MEDICAL HISTORY: Past Medical History:  Diagnosis Date   AICD (automatic cardioverter/defibrillator) present    CAD (coronary artery disease) 80% stenosis diag of the LAD, 30% in OM2 branch of LCX in 2009    a. Nonobstructive CAD by cath 11/2011 with the exception of the pre-existing diagonal branch #2 lesion.   Chronic systolic CHF (congestive heart failure) (HCC)    CKD (chronic kidney disease) stage 3, GFR 30-59 ml/min (HCC)    Colon polyp, hyperplastic    History of kidney stones    History of stress test 06/01/2012   Normal myocardial perfusion study. compared to the previous study there is no significant change. this is a low risk scan   Hypertension    Legally blind    "both eyes"   Myocardial infarction [facility]) 11/22/2011   NICM (nonischemic cardiomyopathy) (HCC)    a. Remote hx of dilated NICM with EF ranging 20-45%, including normal EF by echo (55-60%) in 2014.   Peripheral arterial disease (HCC)    a. 06/2014: ABI right 0.99, left 1.2, LE dopplers revealing an occluded right posterior tibial. As symptoms were not felt r/t claudication, no further w/u at the time.   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 Requiring discontinuation of BB dose.   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  MEDICATIONS:  Current Outpatient Medications  Medication Sig Dispense Refill   prochlorperazine (COMPAZINE) 10 MG tablet Take 1 tablet (10 mg total) by mouth every 6 (six) hours as needed. 30 tablet 2   albuterol (VENTOLIN HFA) 108 (90 Base) MCG/ACT inhaler Inhale 1-2 puffs into the lungs every 6 (six) hours as needed for wheezing or shortness of breath.     apixaban (ELIQUIS) 2.5 MG  TABS tablet Take 1 tablet (2.5 mg total) by mouth 2 (two) times daily. Okay to restart this medication on 07/06/2023     BREO ELLIPTA 100-25 MCG/INH AEPB Inhale 1 puff into the lungs every morning.     brimonidine (ALPHAGAN) 0.2 % ophthalmic solution Place 1 drop into both eyes 2 (two) times daily.     cycloSPORINE (RESTASIS) 0.05 % ophthalmic emulsion Place 1 drop into both eyes 2 (two) times daily.     diclofenac Sodium (VOLTAREN) 1 % GEL Apply 2 g topically 4 (four) times daily. 100 g 0   dorzolamide-timolol (COSOPT) 22.3-6.8 MG/ML ophthalmic solution Place 1 drop into both eyes 2 (two) times daily.     furosemide (LASIX) 20 MG tablet Take 1 tablet (20 mg total) by mouth See admin instructions. Take 20 mg by mouth EVERY OTHER MORNING     isosorbide mononitrate (IMDUR) 30 MG 24 hr tablet Take 30 mg by mouth daily.     metoprolol succinate (TOPROL-XL) 100 MG 24 hr tablet Take 1 tablet (100 mg total) by mouth in the morning. Take with or immediately following a meal.     nitroGLYCERIN (NITROSTAT) 0.4 MG SL tablet Place 1 tablet (0.4 mg total) under the tongue every 5 (five) minutes as needed for chest pain. 25 tablet 1   ROCKLATAN 0.02-0.005 % SOLN Apply 1 drop to eye at bedtime.     sacubitril-valsartan (ENTRESTO) 24-26 MG Take 1 tablet by mouth 2 (two) times daily. 60 tablet 11   spironolactone (ALDACTONE) 25 MG tablet Take 12.5 mg by mouth daily.     No current facility-administered medications for this visit.    SURGICAL HISTORY:  Past Surgical History:  Procedure Laterality Date   BACK SURGERY     BIV UPGRADE N/A 11/08/2019   Procedure: BIV UPGRADE;  Surgeon: Marinus Maw, MD;  Location: [facility] INVASIVE CV LAB;  Service: Cardiovascular;  Laterality: N/A;   BRONCHIAL BIOPSY  07/05/2023   Procedure: BRONCHIAL BIOPSIES;  Surgeon: Leslye Peer, MD;  Location: [facility] ENDOSCOPY;  Service: Pulmonary;;   BRONCHIAL BRUSHINGS  07/05/2023   Procedure: BRONCHIAL BRUSHINGS;  Surgeon: Leslye Peer,  MD;  Location: [facility] ENDOSCOPY;  Service: Pulmonary;;   BRONCHIAL NEEDLE ASPIRATION BIOPSY  07/05/2023   Procedure: BRONCHIAL NEEDLE ASPIRATION BIOPSIES;  Surgeon: Leslye Peer, MD;  Location: [facility] ENDOSCOPY;  Service: Pulmonary;;   BRONCHIAL WASHINGS  07/05/2023   Procedure: BRONCHIAL WASHINGS;  Surgeon: Leslye Peer, MD;  Location: [facility] ENDOSCOPY;  Service: Pulmonary;;   CARDIAC CATHETERIZATION  11/2011   CARDIAC CATHETERIZATION  11/2011   didn't demonstrate high grade obstructive disease to account for his LV dysfunction.   CATARACT EXTRACTION, BILATERAL  1990's   CYSTOSCOPY     CYSTOSCOPY WITH BIOPSY Bilateral 08/03/2021   Procedure: CYSTOSCOPY WITH BLADDER BIOPSY, BILATERAL RETROGRADE PYELOGRAM;  Surgeon: Crista Elliot, MD;  Location: [facility] ORS;  Service: Urology;  Laterality: Bilateral;  REQUESTING 45 MINS   CYSTOSCOPY WITH URETHRAL DILATATION N/A  05/04/2013   Procedure: CYSTOSCOPY WITH URETHRAL DILATATION;  Surgeon: Tia Alert, MD;  Location: [facility] NEURO ORS;  Service: Neurosurgery;  Laterality: N/A;  with insertion of foley catheter   EP study and ablation of VT  7/13   PVC focus mapped to the right coronary cusp of the aorta, limited ablation performed due to proximity of the focus to the right coronary artery   EYE SURGERY     FINE NEEDLE ASPIRATION  07/05/2023   Procedure: FINE NEEDLE ASPIRATION (FNA) LINEAR;  Surgeon: Leslye Peer, MD;  Location: [facility] ENDOSCOPY;  Service: Pulmonary;;   LEFT HEART CATH AND CORONARY ANGIOGRAPHY N/A 11/07/2019   Procedure: LEFT HEART CATH AND CORONARY ANGIOGRAPHY;  Surgeon: Iran Ouch, MD;  Location: [facility] INVASIVE CV LAB;  Service: Cardiovascular;  Laterality: N/A;   LEFT HEART CATHETERIZATION WITH CORONARY ANGIOGRAM N/A 11/25/2011   Procedure: LEFT HEART CATHETERIZATION WITH CORONARY ANGIOGRAM;  Surgeon: Marykay Lex, MD;  Location: [facility] CATH LAB;  Service: Cardiovascular;  Laterality: N/A;   PACEMAKER IMPLANT N/A 07/12/2018   Procedure: PACEMAKER  IMPLANT;  Surgeon: Thurmon Fair, MD;  Location: [facility] INVASIVE CV LAB;  Service: Cardiovascular;  Laterality: N/A;   POSTERIOR FUSION LUMBAR SPINE  1979   ROTATOR CUFF REPAIR  2000's   left   V-TACH ABLATION N/A 06/06/2012   Procedure: V-TACH ABLATION;  Surgeon: Hillis Range, MD;  Location: [facility] CATH LAB;  Service: Cardiovascular;  Laterality: N/A;   VIDEO BRONCHOSCOPY WITH ENDOBRONCHIAL ULTRASOUND N/A 07/05/2023   Procedure: VIDEO BRONCHOSCOPY WITH ENDOBRONCHIAL ULTRASOUND;  Surgeon: Leslye Peer, MD;  Location: [facility] ENDOSCOPY;  Service: Pulmonary;  Laterality: N/A;    REVIEW OF SYSTEMS:   Constitutional: Negative for appetite change, chills, fatigue, fever and unexpected weight change.  HENT: Negative for mouth sores, nosebleeds, sore throat and trouble swallowing.   Eyes: Negative for eye problems and icterus.  Respiratory: Positive for mild occasional dyspnea on exertion.  Negative for cough, hemoptysis, and wheezing.   Cardiovascular: Negative for chest pain and leg swelling.  Gastrointestinal: Negative for abdominal pain, constipation, diarrhea, nausea and vomiting.  Genitourinary: Negative for bladder incontinence, difficulty urinating, dysuria, frequency and hematuria.   Musculoskeletal: Negative for back pain, gait problem, neck pain and neck stiffness.  Skin: Negative for itching and rash.  Neurological: Negative for dizziness, extremity weakness, gait problem, headaches, light-headedness and seizures.  Hematological: Negative for adenopathy. Does not bruise/bleed easily.  Psychiatric/Behavioral: Negative for confusion, depression and sleep disturbance. The patient is not nervous/anxious   PHYSICAL EXAMINATION:  Blood pressure 127/77, pulse 70, temperature 98.1 F (36.7 C), temperature source Oral, resp. rate 17, height 5\' 7"  (1.702 m), weight 171 lb 1.6 oz (77.6 kg), SpO2 100%.  ECOG PERFORMANCE STATUS: 1  Physical Exam  Constitutional: Oriented to person, place, and time and  well-developed, well-nourished, and in no distress.  HENT:  Head: Normocephalic and atraumatic.  Mouth/Throat: Oropharynx is clear and moist. No oropharyngeal exudate.  Eyes: Conjunctivae are normal. Right eye exhibits no discharge. Left eye exhibits no discharge. No scleral icterus.  Neck: Normal range of motion. Neck supple.  Cardiovascular: Normal rate, regular rhythm, normal heart sounds and intact distal pulses.   Pulmonary/Chest: Effort normal and breath sounds normal. No respiratory distress. No wheezes. No rales.  Abdominal: Soft. Bowel sounds are normal. Exhibits no distension and no mass. There is no tenderness.  Musculoskeletal: Normal range of motion. Exhibits no edema.  Lymphadenopathy:    No cervical adenopathy.  Neurological: Alert and oriented to  person, place, and time. Exhibits normal muscle tone. Gait normal. Coordination normal.  Skin: Skin is warm and dry. No rash noted. Not diaphoretic. No erythema. No pallor.  Psychiatric: Mood, memory and judgment normal.  Vitals reviewed.  LABORATORY DATA: Lab Results  Component Value Date   WBC 4.2 08/03/2023   HGB 12.7 (L) 08/03/2023   HCT 37.1 (L) 08/03/2023   MCV 90.9 08/03/2023   PLT 131 (L) 08/03/2023      Chemistry      Component Value Date/Time   NA 140 08/03/2023 0829   NA 142 11/11/2022 1000   K 3.8 08/03/2023 0829   CL 109 08/03/2023 0829   CO2 28 08/03/2023 0829   BUN 14 08/03/2023 0829   BUN 18 11/11/2022 1000   CREATININE 1.47 (H) 08/03/2023 0829   CREATININE 1.37 (H) 01/24/2017 0950      Component Value Date/Time   CALCIUM 9.3 08/03/2023 0829   ALKPHOS 53 08/03/2023 0829   AST 17 08/03/2023 0829   ALT 10 08/03/2023 0829   BILITOT 0.8 08/03/2023 0829       RADIOGRAPHIC STUDIES:  [place] CORE BIOPSY (LYMPH NODES)  Result Date: 07/27/2023 INDICATION: PET positive axillary adenopathy, newly diagnosed lung cancer EXAM: ULTRASOUND GUIDED CORE BIOPSY OF LEFT AXILLARY ADENOPATHY MEDICATIONS: 1% LIDOCAINE  LOW ANESTHESIA/SEDATION: Versed 1.0mg  IV; Fentanyl IV; Moderate Sedation Time:  10 MINUTE The patient was continuously monitored during the procedure by the interventional radiology nurse under my direct supervision. FLUOROSCOPY: Fluoroscopy Time: None. COMPLICATIONS: None immediate. PROCEDURE: The procedure, risks, benefits, and alternatives were explained to the patient. Questions regarding the procedure were encouraged and answered. The patient understands and consents to the procedure. Previous imaging reviewed. Preliminary ultrasound performed. Left axillary adenopathy was localized and correlated with the PET-CT. Overlying skin marked. Under sterile conditions local anesthesia, the 18 gauge core biopsy needle was advanced to the adenopathy. 18 gauge core biopsies obtained of the cortex. Needle position confirmed with ultrasound. Samples placed in saline. Postprocedure imaging demonstrates no hemorrhage or hematoma. Patient tolerated biopsy well. FINDINGS: Imaging confirms needle placement in the left axillary adenopathy for core biopsy IMPRESSION: Successful ultrasound left axillary adenopathy core biopsy Electronically Signed   By: Judie Petit.  Shick M.D.   On: 07/27/2023 15:29   DG Chest Port 1 View  Result Date: 07/05/2023 CLINICAL DATA:  Central microscopy and biopsy EXAM: PORTABLE CHEST - 1 VIEW COMPARISON:  06/02/2023 FINDINGS: Cardiomediastinal silhouette and pulmonary vasculature are within normal limits. Left upper lung mass again seen. There is no pneumothorax. Mild left basilar atelectasis is present. Right lung is well aerated. Left chest wall AICD is unchanged in configuration. Postsurgical changes thoracolumbar spine partially visualized. Surgical anchor seen in the left humeral head. IMPRESSION: No pneumothorax status post left lung biopsy. Electronically Signed   By: Acquanetta Belling M.D.   On: 07/05/2023 12:26   DG C-ARM BRONCHOSCOPY  Result Date: 07/05/2023 C-ARM BRONCHOSCOPY: Fluoroscopy  was utilized by the requesting physician.  No radiographic interpretation.     ASSESSMENT/PLAN:  This is a very pleasant 82 year old African-American male with suspicious stage IIIC/IV (T3, N3, M0) non-small cell lung cancer, adenocarcinoma. He presented with a left upper lobe lung mass and lesion in the lingula.  He also has bilateral mediastinal lymph nodes and hypermetabolic bilateral axillary lymph nodes.  Although it is unclear if the axillary lymph nodes are related to metastatic disease or inflammatory as he did have bilateral vaccines in his upper arms just prior to his PET scan. He  was He was diagnosed in August 2024.   His molecular studies show no actionable mutations and his PD-L1 expression is 1%   Dr. Arbutus Ped had a lengthly discussion with the patient today about his current condition and treatment options. Dr. Arbutus Ped recommends concurrent chemoradiation with carboplatin for an AUC of 2 and paxitaxel 45 mg/m2.  The patient is interested in proceeding with systemic chemotherapy.  He is expected to start her first dose of this treatment on 08/15/23.  We discussed the adverse side effects of treatment including but not limited to myelosuppression, nausea and vomiting, peripheral neuropathy, liver or renal dysfunction as well as immunotherapy mediated adverse effects.   I will arrange for the patient to have a chemoeducation class prior to receiving his first cycle of chemotherapy.   I will refer the patient to radiation oncology.    Send a prescription for Compazine 10 mg p.o. every 6 hours as needed for nausea and vomiting to the patient's pharmacy  The patient will follow-up in 2.5 weeks for a one-week follow-up visit before undergoing cycle #2.   The patient was advised to call immediately if he has any concerning symptoms in the interval. The patient voices understanding of current disease status and treatment options and is in agreement with the current care plan. All questions  were answered. The patient knows to call the clinic with any problems, questions or concerns. We can certainly see the patient much sooner if necessary    Orders Placed This Encounter  Procedures   CBC with Differential (Cancer Center Only)    Standing Status:   Standing    Number of Occurrences:   6    Standing Expiration Date:   08/02/2024   CMP (Cancer Center only)    Standing Status:   Standing    Number of Occurrences:   6    Standing Expiration Date:   08/02/2024   Ambulatory referral to Radiation Oncology    Referral Priority:   Routine    Referral Type:   Consultation    Referral Reason:   Specialty Services Required    Requested Specialty:   Radiation Oncology    Number of Visits Requested:   1      Jessye Imhoff L Christina Waldrop, PA-C 08/03/23  ADDENDUM: Hematology/Oncology Attending: I had a face-to-face encounter with the patient today.  I reviewed his record, lab, scan as well as biopsy results.  This is a very pleasant 82 years old African-American male recently diagnosed with stage IIIc (T3, N3, M0) non-small cell lung cancer, adenocarcinoma presented with left upper lobe lung mass in addition to lingular lesion and bilateral mediastinal lymphadenopathy.  The patient also had hypermetabolic bilateral axillary lymph nodes that were biopsied and they were negative for malignancy. I had a lengthy discussion with the patient today about his current disease stage, prognosis and treatment options. I recommended for the patient a course of concurrent chemoradiation with weekly carboplatin for AUC of 2 and paclitaxel 45 Mg/M2 for 6-7 weeks followed by consolidation treatment with immunotherapy. I discussed with the patient the adverse effect of the chemotherapy including but not limited to alopecia, myelosuppression, nausea and vomiting, peripheral neuropathy, liver or renal dysfunction.  After the discussion the patient was interested in proceeding with the treatment.  We will refer  him to radiation oncology for discussion of the radiotherapy option. We will arrange for the patient to have a chemotherapy education class before the first dose of his treatment. He is expected to start the first  cycle of this treatment on August 15, 2023. He will come back for follow-up visit 1 week after the start of his treatment for management of any adverse effect of his treatment. We also requested his tissue block to be sent for molecular studies and PD-L1 expression. The patient was advised to call immediately if he has any other concerning symptoms in the interval. The total time spent in the appointment was 30 minutes. Disclaimer: This note was dictated with voice recognition software. Similar sounding words can inadvertently be transcribed and may be missed upon review. Lajuana Matte, MD

## 2023-08-01 DIAGNOSIS — I10 Essential (primary) hypertension: Secondary | ICD-10-CM | POA: Diagnosis not present

## 2023-08-01 DIAGNOSIS — C3490 Malignant neoplasm of unspecified part of unspecified bronchus or lung: Secondary | ICD-10-CM | POA: Diagnosis not present

## 2023-08-01 DIAGNOSIS — H42 Glaucoma in diseases classified elsewhere: Secondary | ICD-10-CM | POA: Diagnosis not present

## 2023-08-01 DIAGNOSIS — N189 Chronic kidney disease, unspecified: Secondary | ICD-10-CM | POA: Diagnosis not present

## 2023-08-02 ENCOUNTER — Other Ambulatory Visit: Payer: Self-pay | Admitting: Physician Assistant

## 2023-08-03 ENCOUNTER — Telehealth: Payer: Self-pay | Admitting: Radiation Oncology

## 2023-08-03 ENCOUNTER — Inpatient Hospital Stay: Payer: 59 | Attending: Internal Medicine

## 2023-08-03 ENCOUNTER — Encounter: Payer: Self-pay | Admitting: Internal Medicine

## 2023-08-03 ENCOUNTER — Inpatient Hospital Stay (HOSPITAL_BASED_OUTPATIENT_CLINIC_OR_DEPARTMENT_OTHER): Payer: 59 | Admitting: Physician Assistant

## 2023-08-03 VITALS — BP 127/77 | HR 70 | Temp 98.1°F | Resp 17 | Ht 67.0 in | Wt 171.1 lb

## 2023-08-03 DIAGNOSIS — I252 Old myocardial infarction: Secondary | ICD-10-CM | POA: Insufficient documentation

## 2023-08-03 DIAGNOSIS — C3490 Malignant neoplasm of unspecified part of unspecified bronchus or lung: Secondary | ICD-10-CM

## 2023-08-03 DIAGNOSIS — Z7901 Long term (current) use of anticoagulants: Secondary | ICD-10-CM | POA: Insufficient documentation

## 2023-08-03 DIAGNOSIS — Z8719 Personal history of other diseases of the digestive system: Secondary | ICD-10-CM | POA: Insufficient documentation

## 2023-08-03 DIAGNOSIS — Z7189 Other specified counseling: Secondary | ICD-10-CM | POA: Diagnosis not present

## 2023-08-03 DIAGNOSIS — Z79899 Other long term (current) drug therapy: Secondary | ICD-10-CM | POA: Diagnosis not present

## 2023-08-03 DIAGNOSIS — I739 Peripheral vascular disease, unspecified: Secondary | ICD-10-CM | POA: Insufficient documentation

## 2023-08-03 DIAGNOSIS — I129 Hypertensive chronic kidney disease with stage 1 through stage 4 chronic kidney disease, or unspecified chronic kidney disease: Secondary | ICD-10-CM | POA: Diagnosis not present

## 2023-08-03 DIAGNOSIS — R590 Localized enlarged lymph nodes: Secondary | ICD-10-CM | POA: Insufficient documentation

## 2023-08-03 DIAGNOSIS — R42 Dizziness and giddiness: Secondary | ICD-10-CM | POA: Insufficient documentation

## 2023-08-03 DIAGNOSIS — R0609 Other forms of dyspnea: Secondary | ICD-10-CM | POA: Insufficient documentation

## 2023-08-03 DIAGNOSIS — Z87442 Personal history of urinary calculi: Secondary | ICD-10-CM | POA: Insufficient documentation

## 2023-08-03 DIAGNOSIS — C3412 Malignant neoplasm of upper lobe, left bronchus or lung: Secondary | ICD-10-CM | POA: Insufficient documentation

## 2023-08-03 DIAGNOSIS — N1832 Chronic kidney disease, stage 3b: Secondary | ICD-10-CM | POA: Insufficient documentation

## 2023-08-03 DIAGNOSIS — R61 Generalized hyperhidrosis: Secondary | ICD-10-CM | POA: Insufficient documentation

## 2023-08-03 LAB — CMP (CANCER CENTER ONLY)
ALT: 10 U/L (ref 0–44)
AST: 17 U/L (ref 15–41)
Albumin: 3.6 g/dL (ref 3.5–5.0)
Alkaline Phosphatase: 53 U/L (ref 38–126)
Anion gap: 3 — ABNORMAL LOW (ref 5–15)
BUN: 14 mg/dL (ref 8–23)
CO2: 28 mmol/L (ref 22–32)
Calcium: 9.3 mg/dL (ref 8.9–10.3)
Chloride: 109 mmol/L (ref 98–111)
Creatinine: 1.47 mg/dL — ABNORMAL HIGH (ref 0.61–1.24)
GFR, Estimated: 47 mL/min — ABNORMAL LOW (ref >60)
Glucose, Bld: 103 mg/dL — ABNORMAL HIGH (ref 70–99)
Potassium: 3.8 mmol/L (ref 3.5–5.1)
Sodium: 140 mmol/L (ref 135–145)
Total Bilirubin: 0.8 mg/dL (ref 0.3–1.2)
Total Protein: 7.5 g/dL (ref 6.5–8.1)

## 2023-08-03 LAB — CBC WITH DIFFERENTIAL (CANCER CENTER ONLY)
Abs Immature Granulocytes: 0.03 10*3/uL (ref 0.00–0.07)
Basophils Absolute: 0 10*3/uL (ref 0.0–0.1)
Basophils Relative: 1 %
Eosinophils Absolute: 0.2 10*3/uL (ref 0.0–0.5)
Eosinophils Relative: 5 %
HCT: 37.1 % — ABNORMAL LOW (ref 39.0–52.0)
Hemoglobin: 12.7 g/dL — ABNORMAL LOW (ref 13.0–17.0)
Immature Granulocytes: 1 %
Lymphocytes Relative: 36 %
Lymphs Abs: 1.5 10*3/uL (ref 0.7–4.0)
MCH: 31.1 pg (ref 26.0–34.0)
MCHC: 34.2 g/dL (ref 30.0–36.0)
MCV: 90.9 fL (ref 80.0–100.0)
Monocytes Absolute: 0.4 10*3/uL (ref 0.1–1.0)
Monocytes Relative: 9 %
Neutro Abs: 2 10*3/uL (ref 1.7–7.7)
Neutrophils Relative %: 48 %
Platelet Count: 131 10*3/uL — ABNORMAL LOW (ref 150–400)
RBC: 4.08 MIL/uL — ABNORMAL LOW (ref 4.22–5.81)
RDW: 12.1 % (ref 11.5–15.5)
WBC Count: 4.2 10*3/uL (ref 4.0–10.5)
nRBC: 0 % (ref 0.0–0.2)

## 2023-08-03 MED ORDER — PROCHLORPERAZINE MALEATE 10 MG PO TABS
10.0000 mg | ORAL_TABLET | Freq: Four times a day (QID) | ORAL | 2 refills | Status: DC | PRN
Start: 2023-08-03 — End: 2024-02-23

## 2023-08-03 NOTE — Progress Notes (Signed)
Radiation Oncology         ([phone]) [phone] ________________________________  Initial Outpatient Consultation  Name: [name] MRN: [id number]  Date: 08/04/2023  DOB: 09-26-1941  VH:QIONG, Rodney Saran, Stevens  Rodney Gaul, Stevens   REFERRING PHYSICIAN: Si Gaul, Stevens  DIAGNOSIS: {There were no encounter diagnoses. 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 Presented with a left upper lobe lung mass, a lesion in the lingula, and bilateral hypermetabolic mediastinal lymph nodes (also with hypermetabolic bilateral axillary lymph nodes which have since been proven benign by biopsy)   HISTORY OF PRESENT ILLNESS::[name] is a 82 y.o. male former smoker with a medical history notable for CAD, CHF, cardiomyopathy, ventricular tachycardia s/p AICD placement, MI, PAD, and PVCs . Today, he is accompanied by ***. he is seen as a courtesy of Dr. Arbutus Stevens for an opinion concerning radiation therapy as part of management for his recently diagnosed left lung cancer.   The patient initially presented to the ED on 04/29/23 with c/o left sided chest pain which began after he walked up several flights of stairs the week prior. On examination, his chest pains were easily reproducible, notably over the rib muscles. In the setting of his history of cardiac issues, he was admitted for further workup to rule out a cardiac etiology. Chest x-ray performed showed a stable nodular density in the region of the lingula compared to multiple prior radiographs, and no acute cardiopulmonary abnormalities overall.  His chest pain was ultimately thought to be musculoskeletal in etiology, and he was prescribed a 5 day course of prednisone at discharge.   He however returned to the ED on 06/02/23 with recurrent left chest pain and new onset flank pain since suffering a fall in early July. CT CAP performed in the ED revealed a very suspicious appearing solid nodule  in the posterior aspect of the left upper lobe with spiculated margins measuring 2.7 x 2.0 x 2.4 cm, and an adjacent ground glass opacity. CT also showed: an interval increase in size of an irregular nodular density within the lingula measuring approximately 4.6 x 1.7 x 3.8 cm, previously measuring 3.1 x 1.3 x 2.8 cm on imaging from October 2021, concerning for a slow growing primary lung malignancy; nonspecific but mildly enlarged mediastinal lymph nodes; and a nonspecific 1.5 cm hyperenhancing nodule within the superior aspect of the spleen.   Subsequently, the patient was referred to Dr. Arbutus Stevens on 06/14/23 for further management. During that visit, the patient endorsed some fatigue and a mild cough. He otherwise denied having any current chest pain, shortness of breath or hemoptysis. Dr. Arbutus Stevens ultimately recommended proceeding with staging work-up and a referral to pulmonology for consideration of biopsies.   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 (The meningioma did appear slighly larger by a few mm since 2021, but is without associated cerebral edema or significant mass effect).  -- Super-D chest CT on 06/24/23 demonstrated: interval stability of the apical LUL mass measuring 4.7 cm (with a 2.2 cm solid component) compatible with a primary bronchogenic adenocarcinoma; interval stability of the irregular solid 5.3 cm medial lingular lung mass suspicious for slow growing synchronous primary bronchogenic malignancy; and interval stability of the previously demonstrated solid 0.6 cm anterior left lower lobe  pulmonary nodule. CT also redemonstrated the nonspecific mild bilateral mediastinal lymphadenopathy noted on her prior CT.  -- PET scan on 07/01/23 demonstrated: marked hypermetabolism  associated with the left apical lesion, an additional hypermetabolic lesion in the lingula, and hypermetabolic bilateral axillary and mediastinal lymph nodes concerning for metastatic disease. PET otherwise showed no evidence for hypermetabolic metastatic disease in the abdomen or pelvis, and specifically no hypermetabolism associated with the nonspecific hyperenhancing splenic lesion seen on previous CT imaging.  Accordingly, the patient was referred to Dr. Delton Stevens and opted to proceed with bronchoscopy with biopsies on 07/05/23. FNA of the LUL showed non-small cell carcinoma, consistent with adenocarcinoma. FNA of the left lingula also showed atypical cells present suspicious for non-small cell carcinoma. FNA of lymp nodes 4L, 7, 4R, and 11R were also collected and showed no evidence of malignancy.   Due to evidence of axillary lymphadenopathy on PET imaging, the patient also underwent a left axillary lymph node biopsy on 07/27/23 which showed no evidence of malignancy.   Molecular studies were also ordered and show no actionable mutations and a PD-L1 expression of 1%   Based on his left axillary lymph node biopsy being negative, Dr. Arbutus Stevens has recommended proceeding with concurrent chemoradiation consisting of carboplatin and paclitaxel. He is expected to start his first dose of treatment on 08/15/23.   Smoking history: smoked for 40 years and quit in 2001    PREVIOUS RADIATION THERAPY: No  PAST MEDICAL HISTORY:  Past Medical History:  Diagnosis Date   AICD (automatic cardioverter/defibrillator) present    CAD (coronary artery disease) 80% stenosis diag of the LAD, 30% in OM2 branch of LCX in 2009    a. Nonobstructive CAD by cath 11/2011 with the exception of the pre-existing diagonal branch #2 lesion.   Chronic systolic CHF (congestive heart failure) (HCC)    CKD (chronic kidney disease) stage 3, GFR 30-59 ml/min (HCC)    Colon polyp, hyperplastic    History of kidney stones    History of  stress test 06/01/2012   Normal myocardial perfusion study. compared to the previous study there is no significant change. this is a low risk scan   Hypertension    Legally blind    "both eyes"   Myocardial infarction [facility]) 11/22/2011   NICM (nonischemic cardiomyopathy) (HCC)    a. Remote hx of dilated NICM with EF ranging 20-45%, including normal EF by echo (55-60%) in 2014.   Peripheral arterial disease (HCC)    a. 06/2014: ABI right 0.99, left 1.2, LE dopplers revealing an occluded right posterior tibial. As symptoms were not felt r/t claudication, no further w/u at the time.   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 Requiring discontinuation of BB dose.   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    PAST SURGICAL HISTORY: Past Surgical History:  Procedure Laterality Date   BACK SURGERY     BIV UPGRADE N/A 11/08/2019   Procedure: BIV UPGRADE;  Surgeon: Marinus Maw, Stevens;  Location: [facility] INVASIVE CV LAB;  Service: Cardiovascular;  Laterality: N/A;   BRONCHIAL BIOPSY  07/05/2023   Procedure: BRONCHIAL BIOPSIES;  Surgeon: Leslye Peer, Stevens;  Location: [facility] ENDOSCOPY;  Service: Pulmonary;;   BRONCHIAL BRUSHINGS  07/05/2023   Procedure: BRONCHIAL BRUSHINGS;  Surgeon: Leslye Peer, Stevens;  Location: [facility] ENDOSCOPY;  Service: Pulmonary;;   BRONCHIAL NEEDLE ASPIRATION BIOPSY  07/05/2023   Procedure: BRONCHIAL NEEDLE ASPIRATION BIOPSIES;  Surgeon: Leslye Peer, Stevens;  Location: [facility] ENDOSCOPY;  Service: Pulmonary;;   BRONCHIAL WASHINGS  07/05/2023   Procedure: BRONCHIAL WASHINGS;  Surgeon: Leslye Peer, Stevens;  Location[facility]COPY;  Service: Pulmonary;;   CARDIAC CATHETERIZATION  11/2011   CARDIAC CATHETERIZATION  11/2011   didn't demonstrate high grade obstructive disease to account for his LV dysfunction.   CATARACT EXTRACTION, BILATERAL  1990's   CYSTOSCOPY      CYSTOSCOPY WITH BIOPSY Bilateral 08/03/2021   Procedure: CYSTOSCOPY WITH BLADDER BIOPSY, BILATERAL RETROGRADE PYELOGRAM;  Surgeon: Crista Elliot, Stevens;  Location: [facility] ORS;  Service: Urology;  Laterality: Bilateral;  REQUESTING 45 MINS   CYSTOSCOPY WITH URETHRAL DILATATION N/A 05/04/2013   Procedure: CYSTOSCOPY WITH URETHRAL DILATATION;  Surgeon: Tia Alert, Stevens;  Location: [facility] NEURO ORS;  Service: Neurosurgery;  Laterality: N/A;  with insertion of foley catheter   EP study and ablation of VT  7/13   PVC focus mapped to the right coronary cusp of the aorta, limited ablation performed due to proximity of the focus to the right coronary artery   EYE SURGERY     FINE NEEDLE ASPIRATION  07/05/2023   Procedure: FINE NEEDLE ASPIRATION (FNA) LINEAR;  Surgeon: Leslye Peer, Stevens;  Location: [facility] ENDOSCOPY;  Service: Pulmonary;;   LEFT HEART CATH AND CORONARY ANGIOGRAPHY N/A 11/07/2019   Procedure: LEFT HEART CATH AND CORONARY ANGIOGRAPHY;  Surgeon: Iran Ouch, Stevens;  Location: [facility] INVASIVE CV LAB;  Service: Cardiovascular;  Laterality: N/A;   LEFT HEART CATHETERIZATION WITH CORONARY ANGIOGRAM N/A 11/25/2011   Procedure: LEFT HEART CATHETERIZATION WITH CORONARY ANGIOGRAM;  Surgeon: Marykay Lex, Stevens;  Location: [facility] C[facility] LAB;  Service: Cardiovascular;  Laterality: N/A;   PACEMAKER IMPLANT N/A 07/12/2018   Procedure: PACEMAKER IMPLANT;  Surgeon: Thurmon Fair, Stevens;  Location: [facility] INVASIVE CV LAB;  Service: Cardiovascular;  Laterality: N/A;   POSTERIOR FUSION LUMBAR SPINE  1979   ROTATOR CUFF REPAIR  2000's   left   V-TACH ABLATION N/A 06/06/2012   Procedure: V-TACH ABLATION;  Surgeon: Hillis Range, Stevens;  Location[facility] CATH LAB;  Service: Cardiovascular;  Laterality: N/A;   VIDEO BRONCHOSCOPY WITH ENDOBRONCHIAL ULTRASOUND N/A 07/05/2023   Procedure: VIDEO BRONCHOSCOPY WITH ENDOBRONCHIAL ULTRASOUND;  Surgeon: Leslye Peer, Stevens;  Location: [facility] ENDOSCOPY;  Service: Pulmonary;  Laterality: N/A;    FAMILY  HISTORY:  Family History  Problem Relation Age of Onset   Asthma Mother    Other Other        Unsure if any heart disease in his family.    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    MEDICATIONS:  Current Outpatient Medications  Medication Sig Dispense Refill   albuterol (VENTOLIN HFA) 108 (90 Base) MCG/ACT inhaler Inhale 1-2 puffs  into the lungs every 6 (six) hours as needed for wheezing or shortness of breath.     apixaban (ELIQUIS) 2.5 MG TABS tablet Take 1 tablet (2.5 mg total) by mouth 2 (two) times daily. Okay to restart this medication on 07/06/2023     BREO ELLIPTA 100-25 MCG/INH AEPB Inhale 1 puff into the lungs every morning.     brimonidine (ALPHAGAN) 0.2 % ophthalmic solution Place 1 drop into both eyes 2 (two) times daily.     cycloSPORINE (RESTASIS) 0.05 % ophthalmic emulsion Place 1 drop into both eyes 2 (two) times daily.     diclofenac Sodium (VOLTAREN) 1 % GEL Apply 2 g topically 4 (four) times daily. 100 g 0   dorzolamide-timolol (COSOPT) 22.3-6.8 MG/ML ophthalmic solution Place 1 drop into both eyes 2 (two) times daily.     furosemide (LASIX) 20 MG tablet Take 1 tablet (20 mg total) by mouth See admin instructions. Take 20 mg by mouth EVERY OTHER MORNING     isosorbide mononitrate (IMDUR) 30 MG 24 hr tablet Take 30 mg by mouth daily.     metoprolol succinate (TOPROL-XL) 100 MG 24 hr tablet Take 1 tablet (100 mg total) by mouth in the  morning. Take with or immediately following a meal.     nitroGLYCERIN (NITROSTAT) 0.4 MG SL tablet Place 1 tablet (0.4 mg total) under the tongue every 5 (five) minutes as needed for chest pain. 25 tablet 1   prochlorperazine (COMPAZINE) 10 MG tablet Take 1 tablet (10 mg total) by mouth every 6 (six) hours as needed. 30 tablet 2   ROCKLATAN 0.02-0.005 % SOLN Apply 1 drop to eye at bedtime.     sacubitril-valsartan (ENTRESTO) 24-26 MG Take 1 tablet by mouth 2 (two) times daily. 60 tablet 11   spironolactone (ALDACTONE) 25 MG tablet Take 12.5 mg by mouth daily.     No current facility-administered medications for this encounter.    REVIEW OF SYSTEMS:  A 10+ POINT REVIEW OF SYSTEMS WAS OBTAINED including neurology, dermatology, psychiatry, cardiac, respiratory, lymph, extremities, GI, GU, musculoskeletal, constitutional, reproductive, HEENT. ***   PHYSICAL EXAM:  vitals were not taken for this visit.   General: Alert and oriented, in no acute distress HEENT: Head is normocephalic. Extraocular movements are intact. Oropharynx is clear. Neck: Neck is supple, no palpable cervical or supraclavicular lymphadenopathy. Heart: Regular in rate and rhythm with no murmurs, rubs, or gallops. Chest: Clear to auscultation bilaterally, with no rhonchi, wheezes, or rales. Abdomen: Soft, nontender, nondistended, with no rigidity or guarding. Extremities: No cyanosis or edema. Lymphatics: see Neck Exam Skin: No concerning lesions. Musculoskeletal: symmetric strength and muscle tone throughout. Neurologic: Cranial nerves II through XII are grossly intact. No obvious focalities. Speech is fluent. Coordination is intact. Psychiatric: Judgment and insight are intact. Affect is appropriate. ***  ECOG = ***  0 - Asymptomatic (Fully active, able to carry on all predisease activities without restriction)  1 - Symptomatic but completely ambulatory (Restricted in physically strenuous activity but ambulatory and able  to carry out work of a light or sedentary nature. For example, light housework, office work)  2 - Symptomatic, <50% in bed during the day (Ambulatory and capable of all self care but unable to carry out any work activities. Up and about more than 50% of waking hours)  3 - Symptomatic, >50% in bed, but not bedbound (Capable of only limited self-care, confined to bed or chair 50% or more of waking hours)  4 - Bedbound (Completely disabled.  Cannot carry on any self-care. Totally confined to bed or chair)  5 - Death   Santiago Glad MM, Creech RH, Tormey DC, et al. [phone]). "Toxicity and response criteria of the [facility]". Am. Evlyn Clines. Oncol. 5 (6): 649-55  LABORATORY DATA:  Lab Results  Component Value Date   WBC 4.2 08/03/2023   HGB 12.7 (L) 08/03/2023   HCT 37.1 (L) 08/03/2023   MCV 90.9 08/03/2023   PLT 131 (L) 08/03/2023   NEUTROABS 2.0 08/03/2023   Lab Results  Component Value Date   NA 140 08/03/2023   K 3.8 08/03/2023   CL 109 08/03/2023   CO2 28 08/03/2023   GLUCOSE 103 (H) 08/03/2023   BUN 14 08/03/2023   CREATININE 1.47 (H) 08/03/2023   CALCIUM 9.3 08/03/2023      RADIOGRAPHY: [place] CORE BIOPSY (LYMPH NODES)  Result Date: 07/27/2023 INDICATION: PET positive axillary adenopathy, newly diagnosed lung cancer EXAM: ULTRASOUND GUIDED CORE BIOPSY OF LEFT AXILLARY ADENOPATHY MEDICATIONS: 1% LIDOCAINE LOW ANESTHESIA/SEDATION: Versed 1.0mg  IV; Fentanyl IV; Moderate Sedation Time:  10 MINUTE The patient was continuously monitored during the procedure by the interventional radiology nurse under my direct supervision. FLUOROSCOPY: Fluoroscopy Time: None. COMPLICATIONS: None immediate. PROCEDURE: The procedure, risks, benefits, and alternatives were explained to the patient. Questions regarding the procedure were encouraged and answered. The patient understands and consents to the procedure. Previous imaging reviewed. Preliminary ultrasound performed. Left axillary  adenopathy was localized and correlated with the PET-CT. Overlying skin marked. Under sterile conditions local anesthesia, the 18 gauge core biopsy needle was advanced to the adenopathy. 18 gauge core biopsies obtained of the cortex. Needle position confirmed with ultrasound. Samples placed in saline. Postprocedure imaging demonstrates no hemorrhage or hematoma. Patient tolerated biopsy well. FINDINGS: Imaging confirms needle placement in the left axillary adenopathy for core biopsy IMPRESSION: Successful ultrasound left axillary adenopathy core biopsy Electronically Signed   By: Judie Petit.  Shick M.D.   On: 07/27/2023 15:29   DG Chest Port 1 View  Result Date: 07/05/2023 CLINICAL DATA:  Central microscopy and biopsy EXAM: PORTABLE CHEST - 1 VIEW COMPARISON:  06/02/2023 FINDINGS: Cardiomediastinal silhouette and pulmonary vasculature are within normal limits. Left upper lung mass again seen. There is no pneumothorax. Mild left basilar atelectasis is present. Right lung is well aerated. Left chest wall AICD is unchanged in configuration. Postsurgical changes thoracolumbar spine partially visualized. Surgical anchor seen in the left humeral head. IMPRESSION: No pneumothorax status post left lung biopsy. Electronically Signed   By: Acquanetta Belling M.D.   On: 07/05/2023 12:26   DG C-ARM BRONCHOSCOPY  Result Date: 07/05/2023 C-ARM BRONCHOSCOPY: Fluoroscopy was utilized by the requesting physician.  No radiographic interpretation.      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 Presented with a left upper lobe lung mass, a lesion in the lingula, and bilateral hypermetabolic mediastinal lymph nodes (also with hypermetabolic bilateral axillary lymph nodes which have since been proven benign by biopsy)   ***  Today, I talked to the patient and family about the findings and work-up thus far.  We discussed the natural history of *** and general treatment, highlighting the role of  radiotherapy in the management.  We discussed the available radiation techniques, and focused on the details of logistics and delivery.  We reviewed the anticipated acute and late sequelae associated with radiation in this setting.  The patient was encouraged to ask questions that I answered to the best of my ability. ***  A patient consent form was discussed and signed.  We retained a copy for our records.  The patient would like to proceed with radiation and will be scheduled for CT simulation.  PLAN: ***    *** minutes of total time was spent for this patient encounter, including preparation, face-to-face counseling with the patient and coordination of care, physical exam, and documentation of the encounter.   ------------------------------------------------  Billie Lade, PhD, Stevens  This document serves as a record of services personally performed by Antony Blackbird, Stevens. It was created on his behalf by Neena Rhymes, a trained medical scribe. The creation of this record is based on the scribe's personal observations and the provider's statements to them. This document has been checked and approved by the attending provider.

## 2023-08-03 NOTE — Telephone Encounter (Signed)
9/11 @ 10:33 am Called both patient's contact numbers no answer and/or mailbox is full, cannot left voicemail.  Will try again later.

## 2023-08-03 NOTE — Patient Instructions (Signed)
-  There are two main categories of lung cancer, they are named based on the size of the cancer cell. One is called Non-Small cell lung cancer. The other type is Small Cell Lung Cancer -The sample (biopsy) that they took of your tumor was consistent with a subtype of Non-small cell lung cancer called Adenocarcinoma.  -The biopsy of the lymph nodes in the armpit were negative  -We covered a lot of important information at your appointment today regarding what the treatment plan is moving forward. Here are the the main points that were discussed at your office visit with [place] today:  -The treatment that you will receive consists of two chemotherapy drugs called Carboplatin and Paclitaxel (also called Taxol) -We are planning on starting your treatment next week on _/_/_ but before your start your treatment, I would like you to attend a Chemotherapy Education Class. This involves having you sit down with one of our nurse educators. She will discuss with you one-on-one more details about your treatment as well as general information about resources here at the [facility].  -Your treatment will be given every week for about 6 weeks or so (as long as you are also receiving radiation). We will check your labs (blood work) once a week . Dr. Arbutus Ped or I will see you every other treatment just to make sure that you are doing well and that everything is on track. -We will get a CT scan about 3 weeks after you complete your treatment  Medications:  -Compazine was sent to your pharmacy. This medication is for nausea. You may take this every 6 hours as needed if you feel nausous.   Referrals -I will place the referral to radiation oncology to meet with you to discuss starting radiation treatment. Please be on the lookout for a phone call from them to schedule a consultation.   Follow Up:  -We will see you back for a follow up visit in __ weeks

## 2023-08-03 NOTE — Progress Notes (Signed)
START OFF PATHWAY REGIMEN - Non-Small Cell Lung   [id number]:Carboplatin AUC=2 IV D1 + Paclitaxel 45 mg/m2 IV D1 q7 Days + RT:   A cycle is every 7 days, concurrent with RT:     Paclitaxel      Carboplatin   **Always confirm dose/schedule in your pharmacy ordering system**  Patient Characteristics: Preoperative or Nonsurgical Candidate (Clinical Staging), Stage III - Nonsurgical Candidate (Nonsquamous and Squamous), PS = 0,1, EGFR Mutation - Common (Exon 19 Deletion or Exon 21 L858R Substitution) Status Unknown Therapeutic Status: Preoperative or Nonsurgical Candidate (Clinical Staging) AJCC T Category: cT3 AJCC N Category: cN3 AJCC M Category: cM0 AJCC 8 Stage Grouping: IIIC ECOG Performance Status: 1 EGFR Mutation - Common (Exon 19 Deletion or Exon 21 L858R Substitution): Awaiting Test Results Intent of Therapy: Curative Intent, Discussed with Patient

## 2023-08-04 ENCOUNTER — Encounter: Payer: Self-pay | Admitting: Radiation Oncology

## 2023-08-04 ENCOUNTER — Ambulatory Visit
Admission: RE | Admit: 2023-08-04 | Discharge: 2023-08-04 | Disposition: A | Discharge status: Home / Self Care | Payer: 59 | Source: Ambulatory Visit | Attending: Radiation Oncology | Admitting: Radiation Oncology

## 2023-08-04 VITALS — BP 152/90 | HR 71 | Temp 97.1°F | Resp 18 | Ht 67.0 in | Wt 169.0 lb

## 2023-08-04 DIAGNOSIS — I5022 Chronic systolic (congestive) heart failure: Secondary | ICD-10-CM | POA: Insufficient documentation

## 2023-08-04 DIAGNOSIS — Z7951 Long term (current) use of inhaled steroids: Secondary | ICD-10-CM | POA: Insufficient documentation

## 2023-08-04 DIAGNOSIS — N183 Chronic kidney disease, stage 3 unspecified: Secondary | ICD-10-CM | POA: Diagnosis not present

## 2023-08-04 DIAGNOSIS — Z79899 Other long term (current) drug therapy: Secondary | ICD-10-CM | POA: Diagnosis not present

## 2023-08-04 DIAGNOSIS — C3412 Malignant neoplasm of upper lobe, left bronchus or lung: Secondary | ICD-10-CM

## 2023-08-04 DIAGNOSIS — Z87891 Personal history of nicotine dependence: Secondary | ICD-10-CM | POA: Diagnosis not present

## 2023-08-04 DIAGNOSIS — I739 Peripheral vascular disease, unspecified: Secondary | ICD-10-CM | POA: Diagnosis not present

## 2023-08-04 DIAGNOSIS — Z791 Long term (current) use of non-steroidal anti-inflammatories (NSAID): Secondary | ICD-10-CM | POA: Insufficient documentation

## 2023-08-04 DIAGNOSIS — I472 Ventricular tachycardia, unspecified: Secondary | ICD-10-CM | POA: Diagnosis not present

## 2023-08-04 DIAGNOSIS — I252 Old myocardial infarction: Secondary | ICD-10-CM | POA: Diagnosis not present

## 2023-08-04 DIAGNOSIS — I130 Hypertensive heart and chronic kidney disease with heart failure and stage 1 through stage 4 chronic kidney disease, or unspecified chronic kidney disease: Secondary | ICD-10-CM | POA: Insufficient documentation

## 2023-08-04 DIAGNOSIS — I429 Cardiomyopathy, unspecified: Secondary | ICD-10-CM | POA: Insufficient documentation

## 2023-08-04 DIAGNOSIS — I509 Heart failure, unspecified: Secondary | ICD-10-CM | POA: Insufficient documentation

## 2023-08-04 DIAGNOSIS — Z7901 Long term (current) use of anticoagulants: Secondary | ICD-10-CM | POA: Diagnosis not present

## 2023-08-04 DIAGNOSIS — I2510 Atherosclerotic heart disease of native coronary artery without angina pectoris: Secondary | ICD-10-CM | POA: Insufficient documentation

## 2023-08-04 DIAGNOSIS — Z87442 Personal history of urinary calculi: Secondary | ICD-10-CM | POA: Diagnosis not present

## 2023-08-04 NOTE — Progress Notes (Addendum)
Location of tumor and Histology per Pathology Report: Left upper lobe     DIAGNOSIS: Stage IIIC/IV (T3, N3, M0) non-small cell lung cancer, adenocarcinoma. He presented with a left upper lobe spiculated mass in addition to lingular lesion and suspicious mediastinal lymphadenopathy He also has bilateral hypermetabolic axillary lymph nodes which could be related to metastatic disease versus inflammatory as the patient did have vaccines in both of his arms the week prior to his PET scan   Biopsy:    Past/Anticipated interventions by surgeon, if any: Dr. Delton Coombes 07/05/23   Past/Anticipated interventions by medical oncology, if any: Chemotherapy      Pain issues, if any:  no    SAFETY ISSUES: Prior radiation? No Pacemaker/ICD?  Yes Possible current pregnancy? no Is the patient on methotrexate? no  Current Complaints / other details:  He just wants to be educated on treatment and what to expect.    BP (!) 152/90 (BP Location: Left Arm, Patient Position: Sitting)   Pulse 71   Temp (!) 97.1 F (36.2 C) (Temporal)   Resp 18   Ht 5\' 7"  (1.702 m)   Wt 169 lb (76.7 kg)   SpO2 100%   BMI 26.47 kg/m

## 2023-08-05 ENCOUNTER — Ambulatory Visit
Admission: RE | Admit: 2023-08-05 | Discharge: 2023-08-05 | Disposition: A | Discharge status: Home / Self Care | Payer: 59 | Source: Ambulatory Visit | Attending: Radiation Oncology | Admitting: Radiation Oncology

## 2023-08-05 ENCOUNTER — Telehealth: Payer: Self-pay | Admitting: Internal Medicine

## 2023-08-05 ENCOUNTER — Other Ambulatory Visit: Payer: Self-pay

## 2023-08-05 DIAGNOSIS — Z87891 Personal history of nicotine dependence: Secondary | ICD-10-CM | POA: Diagnosis not present

## 2023-08-05 DIAGNOSIS — Z510 Encounter for antineoplastic radiation therapy: Secondary | ICD-10-CM | POA: Insufficient documentation

## 2023-08-05 DIAGNOSIS — C3412 Malignant neoplasm of upper lobe, left bronchus or lung: Secondary | ICD-10-CM | POA: Insufficient documentation

## 2023-08-05 NOTE — Telephone Encounter (Signed)
Scheduled per los and work-queue, patient has been called and notified of upcoming appointments.

## 2023-08-08 ENCOUNTER — Encounter: Payer: Self-pay | Admitting: Cardiovascular Disease

## 2023-08-08 NOTE — Progress Notes (Signed)
TO BE COMPLETED BY RADIATION ONCOLOGIST OFFICE:   Patient Name: [name]   Date of Birth: [birth year]   Radiation Oncologist: Dr. Antony Blackbird   Site to be Treated: Left Upper Lobe  Will x-rays >10 MV be used? No  Will the radiation be >10 cm from the device? No  Planned Treatment Start Date:   TO BE COMPLETED BY CARDIOLOGIST OFFICE:   Device Information:  Pacemaker []      ICD [x]    Brand: St. Jude/Abbott: [id number] Model #: Louanna Raw [id number] [place] HF  Serial Number: [id number]     Date of Placement: 11/08/2019  Site of Placement: Left chest  Remote Device Check--Frequency: Every 91 days   Last Check: 04/05/2023  Is the Patient Pacer Dependent?:  Yes []   No [x]   Does cardiologist request Radiation Oncology to schedule device testing by vendor for the following:  Prior to the Initiation of Treatments?  Yes []  No [x]  During Treatments?  Yes []  No [x]  Post Radiation Treatments?  Yes [x]  No []   Is device monitoring necessary by vendor/cardiologist team during treatments?  Yes []   No [x]   Is cardiac monitoring by Radiation Oncology nursing necessary during treatments? Yes [x]   No []   Do you recommend device be relocated prior to Radiation Treatment? Yes []   No [x]   **PLEASE LIST ANY NOTES OR SPECIAL REQUESTS:       CARDIOLOGIST SIGNATURE:  Dr. Thurmon Fair Per Device Clinic Standing Orders, Lenor Coffin  08/08/2023 2:23 PM  **Please route completed form back to Radiation Oncology Nursing and "P [facility] RAD ONC ADMIN", OR send an update if there will be a delay in having form completed by expected start date.  **Call [phone] if you have any questions or do not get an in-basket response from a Radiation Oncology staff member

## 2023-08-09 ENCOUNTER — Other Ambulatory Visit: Payer: 59

## 2023-08-11 DIAGNOSIS — C3412 Malignant neoplasm of upper lobe, left bronchus or lung: Secondary | ICD-10-CM | POA: Diagnosis not present

## 2023-08-11 DIAGNOSIS — Z510 Encounter for antineoplastic radiation therapy: Secondary | ICD-10-CM | POA: Diagnosis not present

## 2023-08-11 DIAGNOSIS — Z87891 Personal history of nicotine dependence: Secondary | ICD-10-CM | POA: Diagnosis not present

## 2023-08-12 ENCOUNTER — Inpatient Hospital Stay: Payer: 59

## 2023-08-12 MED FILL — Dexamethasone Sodium Phosphate Inj 100 MG/10ML: INTRAMUSCULAR | Qty: 1 | Status: AC

## 2023-08-13 NOTE — Progress Notes (Signed)
[facility] OFFICE PROGRESS NOTE  [name], MD [address] [place] [address]  DIAGNOSIS: Stage IIIC (T3, N3, M0) non-small cell lung cancer, adenocarcinoma. He presented with a left upper lobe spiculated mass in addition to lingular lesion and suspicious mediastinal lymphadenopathy He also has bilateral hypermetabolic axillary lymph nodes which could be related to metastatic disease versus inflammatory as the patient did have vaccines in both of his arms the week prior to his PET scan and his axillary biopsy was negative for malignancy).    Molecular Studies: His PD-L1 expression was 1% and he has no actionable mutations.  PRIOR THERAPY: None  CURRENT THERAPY: Concurrent chemoradiation with carboplatin for an AUC of 2 and paclitaxel 45 mg/m.  First dose expected on 08/15/23. Status post 1 cycle. He had reaction to taxol and this was changed to abraxane with cycle #2.   INTERVAL HISTORY: [name] 82 y.o. male returns to the clinic today for a follow up visit.  He is currently undergoing concurrent chemoradiation. He is status post his first week of treatment and tolerated it well except he a significant reaction to taxol and this was changed abraxane starting from today.   Since last being seen, he denies any major changes in his health.  He denies any fever, chills, or unexplained weight loss.  He states his breathing is "the same". He states he has a good appetite and "never lost it".  He denies any significant dyspnea but may have some on exertion on occasion.  He denies any significant cough, chest pain, or hemoptysis.  Denies any nausea, vomiting, diarrhea, or constipation.  Denies any visual changes.  He may get a slight headache from time to time that goes away.  He is here today for evaluation and repeat blood work before undergoing cycle #2.      MEDICAL HISTORY: Past Medical History:  Diagnosis Date   AICD (automatic cardioverter/defibrillator)  present    CAD (coronary artery disease) 80% stenosis diag of the LAD, 30% in OM2 branch of LCX in 2009    a. Nonobstructive CAD by cath 11/2011 with the exception of the pre-existing diagonal branch #2 lesion.   Chronic systolic CHF (congestive heart failure) (HCC)    CKD (chronic kidney disease) stage 3, GFR 30-59 ml/min (HCC)    Colon polyp, hyperplastic    History of kidney stones    History of stress test 06/01/2012   Normal myocardial perfusion study. compared to the previous study there is no significant change. this is a low risk scan   Hypertension    Legally blind    "both eyes"   Myocardial infarction [facility]) 11/22/2011   NICM (nonischemic cardiomyopathy) (HCC)    a. Remote hx of dilated NICM with EF ranging 20-45%, including normal EF by echo (55-60%) in 2014.   Peripheral arterial disease (HCC)    a. 06/2014: ABI right 0.99, left 1.2, LE dopplers revealing an occluded right posterior tibial. As symptoms were not felt r/t claudication, no further w/u at the time.   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 Requiring discontinuation of BB dose.   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  MEDICATIONS:  Current Outpatient Medications  Medication Sig Dispense Refill   albuterol (VENTOLIN HFA) 108 (90 Base) MCG/ACT inhaler  Inhale 1-2 puffs into the lungs every 6 (six) hours as needed for wheezing or shortness of breath.     apixaban (ELIQUIS) 2.5 MG TABS tablet Take 1 tablet (2.5 mg total) by mouth 2 (two) times daily. Okay to restart this medication on 07/06/2023     BREO ELLIPTA 100-25 MCG/INH AEPB Inhale 1 puff into the lungs every morning. (Patient not taking: Reported on 08/04/2023)     brimonidine (ALPHAGAN) 0.2 % ophthalmic solution Place 1 drop into both eyes 2 (two) times daily.     cycloSPORINE  (RESTASIS) 0.05 % ophthalmic emulsion Place 1 drop into both eyes 2 (two) times daily.     diclofenac Sodium (VOLTAREN) 1 % GEL Apply 2 g topically 4 (four) times daily. (Patient not taking: Reported on 08/04/2023) 100 g 0   dorzolamide-timolol (COSOPT) 22.3-6.8 MG/ML ophthalmic solution Place 1 drop into both eyes 2 (two) times daily.     furosemide (LASIX) 20 MG tablet Take 1 tablet (20 mg total) by mouth See admin instructions. Take 20 mg by mouth EVERY OTHER MORNING     isosorbide mononitrate (IMDUR) 30 MG 24 hr tablet Take 30 mg by mouth daily. (Patient not taking: Reported on 08/04/2023)     metoprolol succinate (TOPROL-XL) 100 MG 24 hr tablet Take 1 tablet (100 mg total) by mouth in the morning. Take with or immediately following a meal.     nitroGLYCERIN (NITROSTAT) 0.4 MG SL tablet Place 1 tablet (0.4 mg total) under the tongue every 5 (five) minutes as needed for chest pain. 25 tablet 1   prochlorperazine (COMPAZINE) 10 MG tablet Take 1 tablet (10 mg total) by mouth every 6 (six) hours as needed. (Patient not taking: Reported on 08/04/2023) 30 tablet 2   ROCKLATAN 0.02-0.005 % SOLN Apply 1 drop to eye at bedtime.     sacubitril-valsartan (ENTRESTO) 24-26 MG Take 1 tablet by mouth 2 (two) times daily. 60 tablet 11   spironolactone (ALDACTONE) 25 MG tablet Take 12.5 mg by mouth daily.     No current facility-administered medications for this visit.    SURGICAL HISTORY:  Past Surgical History:  Procedure Laterality Date   BACK SURGERY     BIV UPGRADE N/A 11/08/2019   Procedure: BIV UPGRADE;  Surgeon: Marinus Maw, MD;  [facility]: [facility] INVASIVE CV LAB;  Service: Cardiovascular;  Laterality: N/A;   BRONCHIAL BIOPSY  07/05/2023   Procedure: BRONCHIAL BIOPSIES;  Surgeon: Leslye Peer, MD;  Location: [facility] ENDOSCOPY;  Service: Pulmonary;;   BRONCHIAL BRUSHINGS  07/05/2023   Procedure: BRONCHIAL BRUSHINGS;  Surgeon: Leslye Peer, MD;  Location: [facility] ENDOSCOPY;  Service: Pulmonary;;   BRONCHIAL  NEEDLE ASPIRATION BIOPSY  07/05/2023   Procedure: BRONCHIAL NEEDLE ASPIRATION BIOPSIES;  Surgeon: Leslye Peer, MD;  Location: [facility] ENDOSCOPY;  Service: Pulmonary;;   BRONCHIAL WASHINGS  07/05/2023   Procedure: BRONCHIAL WASHINGS;  Surgeon: Leslye Peer, MD;  Location: [facility] ENDOSCOPY;  Service: Pulmonary;;   CARDIAC CATHETERIZATION  11/2011   CARDIAC CATHETERIZATION  11/2011   didn't demonstrate high grade obstructive disease to account for his LV dysfunction.   CATARACT EXTRACTION, BILATERAL  1990's   CYSTOSCOPY     CYSTOSCOPY WITH BIOPSY Bilateral 08/03/2021   Procedure: CYSTOSCOPY WITH BLADDER BIOPSY, BILATERAL RETROGRADE PYELOGRAM;  Surgeon: Crista Elliot, MD;  Location: [facility] ORS;  Service: Urology;  Laterality: Bilateral;  REQUESTING 45 MINS   CYSTOSCOPY WITH URETHRAL DILATATION N/A 05/04/2013   Procedure: CYSTOSCOPY WITH URETHRAL DILATATION;  Surgeon: Kermit Balo  Yetta Barre, MD;  Location: [facility] NEURO ORS;  Service: Neurosurgery;  Laterality: N/A;  with insertion of foley catheter   EP study and ablation of VT  7/13   PVC focus mapped to the right coronary cusp of the aorta, limited ablation performed due to proximity of the focus to the right coronary artery   EYE SURGERY     FINE NEEDLE ASPIRATION  07/05/2023   Procedure: FINE NEEDLE ASPIRATION (FNA) LINEAR;  Surgeon: Leslye Peer, MD;  Location: [facility] ENDOSCOPY;  Service: Pulmonary;;   LEFT HEART CATH AND CORONARY ANGIOGRAPHY N/A 11/07/2019   Procedure: LEFT HEART CATH AND CORONARY ANGIOGRAPHY;  Surgeon: Iran Ouch, MD;  Location: [facility] INVAS[facility] CV LAB;  Service: Cardiovascular;  Laterality: N/A;   LEFT HEART CATHETERIZATION WITH CORONARY ANGIOGRAM N/A 11/25/2011   Procedure: LEFT HEART CATHETERIZATION WITH CORONARY ANGIOGRAM;  Surgeon: Marykay Lex, MD;  Location: [facility] CATH LAB;  Service: Cardiovascular;  Laterality: N/A;   PACEMAKER IMPLANT N/A 07/12/2018   Procedure: PACEMAKER IMPLANT;  Surgeon: Thurmon Fair, MD;  Location: [facility] INVASIVE CV  LAB;  Service: Cardiovascular;  Laterality: N/A;   POSTERIOR FUSION LUMBAR SPINE  1979   ROTATOR CUFF REPAIR  2000's   left   V-TACH ABLATION N/A 06/06/2012   Procedure: V-TACH ABLATION;  Surgeon: Hillis Range, MD;  Location: [facility] CATH LAB;  Service: Cardiovascular;  Laterality: N/A;   VIDEO BRONCHOSCOPY WITH ENDOBRONCHIAL ULTRASOUND N/A 07/05/2023   Procedure: VIDEO BRONCHOSCOPY WITH ENDOBRONCHIAL ULTRASOUND;  Surgeon: Leslye Peer, MD;  Location: [facility] ENDOSCOPY;  Service: Pulmonary;  Laterality: N/A;    REVIEW OF SYSTEMS:   Constitutional: Negative for appetite change, chills, fatigue, fever and unexpected weight change.  HENT: Negative for mouth sores, nosebleeds, sore throat and trouble swallowing.   Eyes: Negative for eye problems and icterus.  Respiratory: Positive for mild occasional dyspnea on exertion.  Negative for cough, hemoptysis, and wheezing.   Cardiovascular: Negative for chest pain and leg swelling.  Gastrointestinal: Negative for abdominal pain, constipation, diarrhea, nausea and vomiting.  Genitourinary: Negative for bladder incontinence, difficulty urinating, dysuria, frequency and hematuria.   Musculoskeletal: Negative for back pain, gait problem, neck pain and neck stiffness.  Skin: Negative for itching and rash.  Neurological: Negative for dizziness, extremity weakness, gait problem, headaches, light-headedness and seizures.  Hematological: Negative for adenopathy. Does not bruise/bleed easily.  Psychiatric/Behavioral: Negative for confusion, depression and sleep disturbance. The patient is not nervous/anxious   PHYSICAL EXAMINATION:  There were no vitals taken for this visit.  ECOG PERFORMANCE STATUS: 1  Physical Exam  Constitutional: Oriented to person, place, and time and well-developed, well-nourished, and in no distress.  HENT:  Head: Normocephalic and atraumatic.  Mouth/Throat: Oropharynx is clear and moist. No oropharyngeal exudate.  Eyes: Conjunctivae  are normal. Right eye exhibits no discharge. Left eye exhibits no discharge. No scleral icterus.  Neck: Normal range of motion. Neck supple.  Cardiovascular: Normal rate, regular rhythm, normal heart sounds and intact distal pulses.   Pulmonary/Chest: Effort normal and breath sounds normal. No respiratory distress. No wheezes. No rales.  Abdominal: Soft. Bowel sounds are normal. Exhibits no distension and no mass. There is no tenderness.  Musculoskeletal: Normal range of motion. Exhibits no edema.  Lymphadenopathy:    No cervical adenopathy.  Neurological: Alert and oriented to person, place, and time. Exhibits normal muscle tone. Gait normal. Coordination normal.  Skin: Skin is warm and dry. No rash noted. Not diaphoretic. No erythema. No pallor.  Psychiatric: Mood, memory and judgment  normal.  Vitals reviewed.  LABORATORY DATA: Lab Results  Component Value Date   WBC 4.2 08/03/2023   HGB 12.7 (L) 08/03/2023   HCT 37.1 (L) 08/03/2023   MCV 90.9 08/03/2023   PLT 131 (L) 08/03/2023      Chemistry      Component Value Date/Time   NA 140 08/03/2023 0829   NA 142 11/11/2022 1000   K 3.8 08/03/2023 0829   CL 109 08/03/2023 0829   CO2 28 08/03/2023 0829   BUN 14 08/03/2023 0829   BUN 18 11/11/2022 1000   CREATININE 1.47 (H) 08/03/2023 0829   CREATININE 1.37 (H) 01/24/2017 0950      Component Value Date/Time   CALCIUM 9.3 08/03/2023 0829   ALKPHOS 53 08/03/2023 0829   AST 17 08/03/2023 0829   ALT 10 08/03/2023 0829   BILITOT 0.8 08/03/2023 0829       RADIOGRAPHIC STUDIES:  [place] CORE BIOPSY (LYMPH NODES)  Result Date: 07/27/2023 INDICATION: PET positive axillary adenopathy, newly diagnosed lung cancer EXAM: ULTRASOUND GUIDED CORE BIOPSY OF LEFT AXILLARY ADENOPATHY MEDICATIONS: 1% LIDOCAINE LOW ANESTHESIA/SEDATION: Versed 1.0mg  IV; Fentanyl IV; Moderate Sedation Time:  10 MINUTE The patient was continuously monitored during the procedure by the interventional radiology  nurse under my direct supervision. FLUOROSCOPY: Fluoroscopy Time: None. COMPLICATIONS: None immediate. PROCEDURE: The procedure, risks, benefits, and alternatives were explained to the patient. Questions regarding the procedure were encouraged and answered. The patient understands and consents to the procedure. Previous imaging reviewed. Preliminary ultrasound performed. Left axillary adenopathy was localized and correlated with the PET-CT. Overlying skin marked. Under sterile conditions local anesthesia, the 18 gauge core biopsy needle was advanced to the adenopathy. 18 gauge core biopsies obtained of the cortex. Needle position confirmed with ultrasound. Samples placed in saline. Postprocedure imaging demonstrates no hemorrhage or hematoma. Patient tolerated biopsy well. FINDINGS: Imaging confirms needle placement in the left axillary adenopathy for core biopsy IMPRESSION: Successful ultrasound left axillary adenopathy core biopsy Electronically Signed   By: Judie Petit.  Shick M.D.   On: 07/27/2023 15:29     ASSESSMENT/PLAN:  This is a very pleasant 83 year old African-American male with suspicious stage IIIC/IV (T3, N3, M0) non-small cell lung cancer, adenocarcinoma. He presented with a left upper lobe lung mass and lesion in the lingula.  He also has bilateral mediastinal lymph nodes and hypermetabolic bilateral axillary lymph nodes.  Although it is unclear if the axillary lymph nodes are related to metastatic disease or inflammatory as he did have bilateral vaccines in his upper arms just prior to his PET scan and his axillary biopsy was negative. He was He was diagnosed in August 2024.    His molecular studies show no actionable mutations and his PD-L1 expression is 1%    He is currently undergoing treatment with concurrent chemoradiation with carboplatin for an AUC of 2 and paxitaxel 45 mg/m2.  His first treatment was on 08/15/23. He is status post 1 cycle. He had a reaction to taxol with cycle #1 and this was  changed to abraxane with cycle #2.   Labs were reviewed. Recommend he proceed with cycle #2 today as scheduled.   We will see him back for a follow up visit in 2 weeks for evaluation and repeat blood work before undergoing cycle #4.   The patient was advised to call immediately if she has any concerning symptoms in the interval. The patient voices understanding of current disease status and treatment options and is in agreement with the current care  plan. All questions were answered. The patient knows to call the clinic with any problems, questions or concerns. We can certainly see the patient much sooner if necessary        No orders of the defined types were placed in this encounter. The total time spent in the appointment was 20-29 minutes  Phelix Fudala L Zerah Hilyer, PA-C 08/13/23

## 2023-08-15 ENCOUNTER — Ambulatory Visit: Payer: 59 | Admitting: Internal Medicine

## 2023-08-15 ENCOUNTER — Other Ambulatory Visit: Payer: Self-pay | Admitting: Internal Medicine

## 2023-08-15 ENCOUNTER — Inpatient Hospital Stay: Payer: 59

## 2023-08-15 ENCOUNTER — Encounter: Payer: Self-pay | Admitting: Internal Medicine

## 2023-08-15 ENCOUNTER — Inpatient Hospital Stay (HOSPITAL_BASED_OUTPATIENT_CLINIC_OR_DEPARTMENT_OTHER): Payer: 59 | Admitting: Physician Assistant

## 2023-08-15 VITALS — BP 183/104 | HR 71 | Temp 97.6°F | Resp 18

## 2023-08-15 VITALS — BP 141/81 | HR 71 | Temp 97.6°F | Resp 18 | Wt 167.0 lb

## 2023-08-15 DIAGNOSIS — T50905A Adverse effect of unspecified drugs, medicaments and biological substances, initial encounter: Secondary | ICD-10-CM | POA: Diagnosis not present

## 2023-08-15 DIAGNOSIS — R590 Localized enlarged lymph nodes: Secondary | ICD-10-CM | POA: Diagnosis not present

## 2023-08-15 DIAGNOSIS — C3412 Malignant neoplasm of upper lobe, left bronchus or lung: Secondary | ICD-10-CM | POA: Diagnosis not present

## 2023-08-15 DIAGNOSIS — I252 Old myocardial infarction: Secondary | ICD-10-CM | POA: Diagnosis not present

## 2023-08-15 DIAGNOSIS — Z87442 Personal history of urinary calculi: Secondary | ICD-10-CM | POA: Diagnosis not present

## 2023-08-15 DIAGNOSIS — R0609 Other forms of dyspnea: Secondary | ICD-10-CM | POA: Diagnosis not present

## 2023-08-15 DIAGNOSIS — I129 Hypertensive chronic kidney disease with stage 1 through stage 4 chronic kidney disease, or unspecified chronic kidney disease: Secondary | ICD-10-CM | POA: Diagnosis not present

## 2023-08-15 DIAGNOSIS — R61 Generalized hyperhidrosis: Secondary | ICD-10-CM | POA: Diagnosis not present

## 2023-08-15 DIAGNOSIS — Z8719 Personal history of other diseases of the digestive system: Secondary | ICD-10-CM | POA: Diagnosis not present

## 2023-08-15 DIAGNOSIS — Z7901 Long term (current) use of anticoagulants: Secondary | ICD-10-CM | POA: Diagnosis not present

## 2023-08-15 DIAGNOSIS — N1832 Chronic kidney disease, stage 3b: Secondary | ICD-10-CM | POA: Diagnosis not present

## 2023-08-15 DIAGNOSIS — Z79899 Other long term (current) drug therapy: Secondary | ICD-10-CM | POA: Diagnosis not present

## 2023-08-15 DIAGNOSIS — I739 Peripheral vascular disease, unspecified: Secondary | ICD-10-CM | POA: Diagnosis not present

## 2023-08-15 DIAGNOSIS — R42 Dizziness and giddiness: Secondary | ICD-10-CM | POA: Diagnosis not present

## 2023-08-15 LAB — CBC WITH DIFFERENTIAL (CANCER CENTER ONLY)
Abs Immature Granulocytes: 0.02 10*3/uL (ref 0.00–0.07)
Basophils Absolute: 0 10*3/uL (ref 0.0–0.1)
Basophils Relative: 1 %
Eosinophils Absolute: 0.2 10*3/uL (ref 0.0–0.5)
Eosinophils Relative: 4 %
HCT: 38.9 % — ABNORMAL LOW (ref 39.0–52.0)
Hemoglobin: 13.1 g/dL (ref 13.0–17.0)
Immature Granulocytes: 1 %
Lymphocytes Relative: 34 %
Lymphs Abs: 1.4 10*3/uL (ref 0.7–4.0)
MCH: 30.9 pg (ref 26.0–34.0)
MCHC: 33.7 g/dL (ref 30.0–36.0)
MCV: 91.7 fL (ref 80.0–100.0)
Monocytes Absolute: 0.4 10*3/uL (ref 0.1–1.0)
Monocytes Relative: 10 %
Neutro Abs: 2.2 10*3/uL (ref 1.7–7.7)
Neutrophils Relative %: 50 %
Platelet Count: 139 10*3/uL — ABNORMAL LOW (ref 150–400)
RBC: 4.24 MIL/uL (ref 4.22–5.81)
RDW: 12.3 % (ref 11.5–15.5)
WBC Count: 4.2 10*3/uL (ref 4.0–10.5)
nRBC: 0 % (ref 0.0–0.2)

## 2023-08-15 LAB — CMP (CANCER CENTER ONLY)
ALT: 9 U/L (ref 0–44)
AST: 14 U/L — ABNORMAL LOW (ref 15–41)
Albumin: 3.6 g/dL (ref 3.5–5.0)
Alkaline Phosphatase: 60 U/L (ref 38–126)
Anion gap: 5 (ref 5–15)
BUN: 21 mg/dL (ref 8–23)
CO2: 25 mmol/L (ref 22–32)
Calcium: 9.5 mg/dL (ref 8.9–10.3)
Chloride: 109 mmol/L (ref 98–111)
Creatinine: 1.69 mg/dL — ABNORMAL HIGH (ref 0.61–1.24)
GFR, Estimated: 40 mL/min — ABNORMAL LOW (ref >60)
Glucose, Bld: 121 mg/dL — ABNORMAL HIGH (ref 70–99)
Potassium: 3.9 mmol/L (ref 3.5–5.1)
Sodium: 139 mmol/L (ref 135–145)
Total Bilirubin: 0.7 mg/dL (ref 0.3–1.2)
Total Protein: 7.4 g/dL (ref 6.5–8.1)

## 2023-08-15 MED ORDER — HEPARIN SOD (PORK) LOCK FLUSH 100 UNIT/ML IV SOLN: 500.0000 [IU] | Freq: Once | INTRAVENOUS | Status: DC | PRN

## 2023-08-15 MED ORDER — PALONOSETRON HCL INJECTION 0.25 MG/5ML
0.2500 mg | Freq: Once | INTRAVENOUS | Status: AC
  Administered 2023-08-15: 0.25 mg via INTRAVENOUS
  Filled 2023-08-15: qty 5

## 2023-08-15 MED ORDER — SODIUM CHLORIDE 0.9 % IV SOLN
130.0000 mg | Freq: Once | INTRAVENOUS | Status: AC
  Administered 2023-08-15: 130 mg via INTRAVENOUS
  Filled 2023-08-15: qty 13

## 2023-08-15 MED ORDER — FAMOTIDINE IN NACL 20-0.9 MG/50ML-% IV SOLN
20.0000 mg | Freq: Once | INTRAVENOUS | Status: AC | PRN
  Administered 2023-08-15: 20 mg via INTRAVENOUS

## 2023-08-15 MED ORDER — SODIUM CHLORIDE 0.9 % IV SOLN: Freq: Once | INTRAVENOUS | Status: DC | PRN

## 2023-08-15 MED ORDER — SODIUM CHLORIDE 0.9 % IV SOLN: Freq: Once | INTRAVENOUS | Status: AC

## 2023-08-15 MED ORDER — SODIUM CHLORIDE 0.9 % IV SOLN
10.0000 mg | Freq: Once | INTRAVENOUS | Status: AC
  Administered 2023-08-15: 10 mg via INTRAVENOUS
  Filled 2023-08-15: qty 10

## 2023-08-15 MED ORDER — DIPHENHYDRAMINE HCL 50 MG/ML IJ SOLN
50.0000 mg | Freq: Once | INTRAMUSCULAR | Status: AC
  Administered 2023-08-15: 50 mg via INTRAVENOUS
  Filled 2023-08-15: qty 1

## 2023-08-15 MED ORDER — METHYLPREDNISOLONE SODIUM SUCC 125 MG IJ SOLR
125.0000 mg | Freq: Once | INTRAMUSCULAR | Status: AC | PRN
  Administered 2023-08-15: 62.5125 mg via INTRAVENOUS
  Administered 2023-08-15: 62.5 mg via INTRAVENOUS

## 2023-08-15 MED ORDER — SODIUM CHLORIDE 0.9 % IV SOLN
45.0000 mg/m2 | Freq: Once | INTRAVENOUS | Status: AC
  Administered 2023-08-15: 84 mg via INTRAVENOUS
  Filled 2023-08-15: qty 14

## 2023-08-15 MED ORDER — FAMOTIDINE IN NACL 20-0.9 MG/50ML-% IV SOLN
20.0000 mg | Freq: Once | INTRAVENOUS | Status: AC
  Administered 2023-08-15: 20 mg via INTRAVENOUS
  Filled 2023-08-15: qty 50

## 2023-08-15 MED ORDER — SODIUM CHLORIDE 0.9% FLUSH: 10.0000 mL | INTRAVENOUS | Status: DC | PRN

## 2023-08-15 NOTE — Progress Notes (Signed)
Hypersensitivity Reaction note  Date of event: 08/15/23 Time of event: 1225 Generic name of drug involved: Paclitaxel. Name of provider notified of the hypersensitivity reaction: Si Gaul MD Was agent that likely caused hypersensitivity reaction added to Allergies List within EMR? Yes Chain of events including reaction signs/symptoms, treatment administered, and outcome (e.g., drug resumed; drug discontinued; sent to Emergency Department; etc.)  10 minutes into Paclitaxel infusion, pt began c/o severe back pain, chest pain, and chest tightness. Infusion stopped. 1L NS hung to gravity. Kaitlyn PA called to chairside to assess pt. Pt was given Pepcid 20 and Solu-Medrol 62.5mg . Pt reported slight easing of symptoms. V/S improved (See MAR). After 45 minutes of observation, pt still reported chest tightness at 2/10. Per PA, 62.5mg  Solu-medrol given. Infusion discontinued permanently per MD. Pt reported resolution of chest tightness prior to discharge.   Rae Roam, RN 08/15/2023 2:57 PM

## 2023-08-15 NOTE — Progress Notes (Signed)
Per Arbutus Ped MD, ok to treat today with SCR 1.69.

## 2023-08-15 NOTE — Patient Instructions (Signed)
[facility]  Discharge Instructions: Thank you for choosing [facility] to provide your oncology and hematology care.   If you have a lab appointment with the Cancer Center, please go directly to the Cancer Center and check in at the registration area.   Wear comfortable clothing and clothing appropriate for easy access to any Portacath or PICC line.   We strive to give you quality time with your provider. You may need to reschedule your appointment if you arrive late (15 or more minutes).  Arriving late affects you and other patients whose appointments are after yours.  Also, if you miss three or more appointments without notifying the office, you may be dismissed from the clinic at the provider's discretion.      For prescription refill requests, have your pharmacy contact our office and allow 72 hours for refills to be completed.    Today you received the following chemotherapy and/or immunotherapy agents : Paclitaxel, Carboplatin      To help prevent nausea and vomiting after your treatment, we encourage you to take your nausea medication as directed.  BELOW ARE SYMPTOMS THAT SHOULD BE REPORTED IMMEDIATELY: *FEVER GREATER THAN 100.4 F (38 C) OR HIGHER *CHILLS OR SWEATING *NAUSEA AND VOMITING THAT IS NOT CONTROLLED WITH YOUR NAUSEA MEDICATION *UNUSUAL SHORTNESS OF BREATH *UNUSUAL BRUISING OR BLEEDING *URINARY PROBLEMS (pain or burning when urinating, or frequent urination) *BOWEL PROBLEMS (unusual diarrhea, constipation, pain near the anus) TENDERNESS IN MOUTH AND THROAT WITH OR WITHOUT PRESENCE OF ULCERS (sore throat, sores in mouth, or a toothache) UNUSUAL RASH, SWELLING OR PAIN  UNUSUAL VAGINAL DISCHARGE OR ITCHING   Items with * indicate a potential emergency and should be followed up as soon as possible or go to the Emergency Department if any problems should occur.  Please show the CHEMOTHERAPY ALERT CARD or IMMUNOTHERAPY  ALERT CARD at check-in to the Emergency Department and triage nurse.  Should you have questions after your visit or need to cancel or reschedule your appointment, please contact [facility]  Dept: [phone]  and follow the prompts.  Office hours are 8:00 a.m. to 4:30 p.m. Monday - Friday. Please note that voicemails left after 4:00 p.m. may not be returned until the following business day.  We are closed weekends and major holidays. You have access to a nurse at all times for urgent questions. Please call the main number to the clinic Dept: [phone] and follow the prompts.   For any non-urgent questions, you may also contact your provider using MyChart. We now offer e-Visits for anyone 41 and older to request care online for non-urgent symptoms. For details visit [email or web address].   Also download the MyChart app! Go to the app store, search "MyChart", open the app, select [facility], and log in with your MyChart username and password.  Paclitaxel Injection What is this medication? PACLITAXEL (PAK li TAX el) treats some types of cancer. It works by slowing down the growth of cancer cells. This medicine may be used for other purposes; ask your health care provider or pharmacist if you have questions. COMMON BRAND NAME(S): Onxol, Taxol What should I tell my care team before I take this medication? They need to know if you have any of these conditions: Heart disease Liver disease Low white blood cell levels An unusual or allergic reaction to paclitaxel, other medications, foods, dyes, or preservatives If you or your partner are pregnant or trying  to get pregnant Breast-feeding How should I use this medication? This medication is injected into a vein. It is given by your care team in a hospital or clinic setting. Talk to your care team about the use of this medication in children. While it may be given to children for selected conditions,  precautions do apply. Overdosage: If you think you have taken too much of this medicine contact a poison control center or emergency room at once. NOTE: This medicine is only for you. Do not share this medicine with others. What if I miss a dose? Keep appointments for follow-up doses. It is important not to miss your dose. Call your care team if you are unable to keep an appointment. What may interact with this medication? Do not take this medication with any of the following: Live virus vaccines Other medications may affect the way this medication works. Talk with your care team about all of the medications you take. They may suggest changes to your treatment plan to lower the risk of side effects and to make sure your medications work as intended. This list may not describe all possible interactions. Give your health care provider a list of all the medicines, herbs, non-prescription drugs, or dietary supplements you use. Also tell them if you smoke, drink alcohol, or use illegal drugs. Some items may interact with your medicine. What should I watch for while using this medication? Your condition will be monitored carefully while you are receiving this medication. You may need blood work while taking this medication. This medication may make you feel generally unwell. This is not uncommon as chemotherapy can affect healthy cells as well as cancer cells. Report any side effects. Continue your course of treatment even though you feel ill unless your care team tells you to stop. This medication can cause serious allergic reactions. To reduce the risk, your care team may give you other medications to take before receiving this one. Be sure to follow the directions from your care team. This medication may increase your risk of getting an infection. Call your care team for advice if you get a fever, chills, sore throat, or other symptoms of a cold or flu. Do not treat yourself. Try to avoid being around  people who are sick. This medication may increase your risk to bruise or bleed. Call your care team if you notice any unusual bleeding. Be careful brushing or flossing your teeth or using a toothpick because you may get an infection or bleed more easily. If you have any dental work done, tell your dentist you are receiving this medication. Talk to your care team if you may be pregnant. Serious birth defects can occur if you take this medication during pregnancy. Talk to your care team before breastfeeding. Changes to your treatment plan may be needed. What side effects may I notice from receiving this medication? Side effects that you should report to your care team as soon as possible: Allergic reactions--skin rash, itching, hives, swelling of the face, lips, tongue, or throat Heart rhythm changes--fast or irregular heartbeat, dizziness, feeling faint or lightheaded, chest pain, trouble breathing Increase in blood pressure Infection--fever, chills, cough, sore throat, wounds that don't heal, pain or trouble when passing urine, general feeling of discomfort or being unwell Low blood pressure--dizziness, feeling faint or lightheaded, blurry vision Low red blood cell level--unusual weakness or fatigue, dizziness, headache, trouble breathing Painful swelling, warmth, or redness of the skin, blisters or sores at the infusion site Pain, tingling, or  numbness in the hands or feet Slow heartbeat--dizziness, feeling faint or lightheaded, confusion, trouble breathing, unusual weakness or fatigue Unusual bruising or bleeding Side effects that usually do not require medical attention (report to your care team if they continue or are bothersome): Diarrhea Hair loss Joint pain Loss of appetite Muscle pain Nausea Vomiting This list may not describe all possible side effects. Call your doctor for medical advice about side effects. You may report side effects to FDA at 1-800-FDA-1088. Where should I keep  my medication? This medication is given in a hospital or clinic. It will not be stored at home. NOTE: This sheet is a summary. It may not cover all possible information. If you have questions about this medicine, talk to your doctor, pharmacist, or health care provider.  2024 Elsevier/Gold Standard (2022-03-30 00:00:00) Carboplatin Injection What is this medication? CARBOPLATIN (KAR boe pla tin) treats some types of cancer. It works by slowing down the growth of cancer cells. This medicine may be used for other purposes; ask your health care provider or pharmacist if you have questions. COMMON BRAND NAME(S): Paraplatin What should I tell my care team before I take this medication? They need to know if you have any of these conditions: Blood disorders Hearing problems Kidney disease Recent or ongoing radiation therapy An unusual or allergic reaction to carboplatin, cisplatin, other medications, foods, dyes, or preservatives Pregnant or trying to get pregnant Breast-feeding How should I use this medication? This medication is injected into a vein. It is given by your care team in a hospital or clinic setting. Talk to your care team about the use of this medication in children. Special care may be needed. Overdosage: If you think you have taken too much of this medicine contact a poison control center or emergency room at once. NOTE: This medicine is only for you. Do not share this medicine with others. What if I miss a dose? Keep appointments for follow-up doses. It is important not to miss your dose. Call your care team if you are unable to keep an appointment. What may interact with this medication? Medications for seizures Some antibiotics, such as amikacin, gentamicin, neomycin, streptomycin, tobramycin Vaccines This list may not describe all possible interactions. Give your health care provider a list of all the medicines, herbs, non-prescription drugs, or dietary supplements you  use. Also tell them if you smoke, drink alcohol, or use illegal drugs. Some items may interact with your medicine. What should I watch for while using this medication? Your condition will be monitored carefully while you are receiving this medication. You may need blood work while taking this medication. This medication may make you feel generally unwell. This is not uncommon, as chemotherapy can affect healthy cells as well as cancer cells. Report any side effects. Continue your course of treatment even though you feel ill unless your care team tells you to stop. In some cases, you may be given additional medications to help with side effects. Follow all directions for their use. This medication may increase your risk of getting an infection. Call your care team for advice if you get a fever, chills, sore throat, or other symptoms of a cold or flu. Do not treat yourself. Try to avoid being around people who are sick. Avoid taking medications that contain aspirin, acetaminophen, ibuprofen, naproxen, or ketoprofen unless instructed by your care team. These medications may hide a fever. Be careful brushing or flossing your teeth or using a toothpick because you may get an  infection or bleed more easily. If you have any dental work done, tell your dentist you are receiving this medication. Talk to your care team if you wish to become pregnant or think you might be pregnant. This medication can cause serious birth defects. Talk to your care team about effective forms of contraception. Do not breast-feed while taking this medication. What side effects may I notice from receiving this medication? Side effects that you should report to your care team as soon as possible: Allergic reactions--skin rash, itching, hives, swelling of the face, lips, tongue, or throat Infection--fever, chills, cough, sore throat, wounds that don't heal, pain or trouble when passing urine, general feeling of discomfort or being  unwell Low red blood cell level--unusual weakness or fatigue, dizziness, headache, trouble breathing Pain, tingling, or numbness in the hands or feet, muscle weakness, change in vision, confusion or trouble speaking, loss of balance or coordination, trouble walking, seizures Unusual bruising or bleeding Side effects that usually do not require medical attention (report to your care team if they continue or are bothersome): Hair loss Nausea Unusual weakness or fatigue Vomiting This list may not describe all possible side effects. Call your doctor for medical advice about side effects. You may report side effects to FDA at 1-800-FDA-1088. Where should I keep my medication? This medication is given in a hospital or clinic. It will not be stored at home. NOTE: This sheet is a summary. It may not cover all possible information. If you have questions about this medicine, talk to your doctor, pharmacist, or health care provider.  2024 Elsevier/Gold Standard (2022-03-02 00:00:00)

## 2023-08-15 NOTE — Progress Notes (Unsigned)
    DATE:  08/15/23                                        X CHEMO/IMMUNOTHERAPY REACTION           MD: Arbutus Ped   AGENT/BLOOD PRODUCT RECEIVING TODAY:              Paclitaxel and Carboplatin   AGENT/BLOOD PRODUCT RECEIVING IMMEDIATELY PRIOR TO REACTION:          Paclitaxel   Vitals:   08/15/23 1229 08/15/23 1233  BP: (!) 165/98 (!) 183/104  Pulse: 70 71  Resp: 18 18  Temp: 97.6 F (36.4 C)   TempSrc: Oral   SpO2: 97% 94%    REACTION(S):           chest pain, back pain   PREMEDS:     Benadryl 50 mg IV, Aloxi 0.25 mg IV, Pepcid 20 mg IV, Decadron 10 mg IV   INTERVENTION: Pepcid 20 mg IV, solu-medrol 125 mg IV   Review of Systems  Review of Systems  Cardiovascular:  Positive for chest pain.  Musculoskeletal:  Positive for back pain.  All other systems reviewed and are negative.    Physical Exam  Physical Exam Vitals and nursing note reviewed.  Constitutional:      Appearance: He is not ill-appearing or toxic-appearing.  HENT:     Head: Normocephalic.  Eyes:     Conjunctiva/sclera: Conjunctivae normal.  Cardiovascular:     Rate and Rhythm: Normal rate and regular rhythm.     Pulses: Normal pulses.     Heart sounds: Normal heart sounds.  Pulmonary:     Effort: Pulmonary effort is normal.     Breath sounds: Normal breath sounds.  Abdominal:     General: There is no distension.  Musculoskeletal:     Cervical back: Normal range of motion.  Skin:    General: Skin is warm and dry.  Neurological:     Mental Status: He is alert.    OUTCOME:    Patient became symptomatic approximately 10 minutes into Paclitaxel infusion after receiving 9 mL. Emergency medications were administered as documented above. Patient returned to baseline. Oncologist notified and agrees with plan to discontinue taxol given severity of reaction. Patient tolerated carboplatin infusion.   I have spent a total of 10 minutes minutes of face-to-face and non-face-to-face time preparing to see the  patient, performing a medically appropriate examination, counseling and educating the patient, ordering medications, documenting clinical information in the electronic health record, and care coordination.

## 2023-08-16 ENCOUNTER — Encounter: Payer: Self-pay | Admitting: Internal Medicine

## 2023-08-16 NOTE — Progress Notes (Signed)
Called pt to introduce myself as his Dance movement psychotherapist and to discuss the [employer].  Pt declined it, he doesn't need assistance with personal bills or household finances at this time.

## 2023-08-17 ENCOUNTER — Telehealth: Payer: Self-pay

## 2023-08-17 ENCOUNTER — Ambulatory Visit
Admission: RE | Admit: 2023-08-17 | Discharge: 2023-08-17 | Disposition: A | Discharge status: Home / Self Care | Payer: 59 | Source: Ambulatory Visit | Attending: Radiation Oncology | Admitting: Radiation Oncology

## 2023-08-17 ENCOUNTER — Other Ambulatory Visit: Payer: Self-pay

## 2023-08-17 DIAGNOSIS — Z510 Encounter for antineoplastic radiation therapy: Secondary | ICD-10-CM | POA: Diagnosis not present

## 2023-08-17 DIAGNOSIS — N200 Calculus of kidney: Secondary | ICD-10-CM | POA: Diagnosis not present

## 2023-08-17 DIAGNOSIS — C3412 Malignant neoplasm of upper lobe, left bronchus or lung: Secondary | ICD-10-CM | POA: Diagnosis not present

## 2023-08-17 DIAGNOSIS — Z87891 Personal history of nicotine dependence: Secondary | ICD-10-CM | POA: Diagnosis not present

## 2023-08-17 DIAGNOSIS — R3121 Asymptomatic microscopic hematuria: Secondary | ICD-10-CM | POA: Diagnosis not present

## 2023-08-17 LAB — RAD ONC ARIA SESSION SUMMARY
Course Elapsed Days: 0
Plan Fractions Treated to Date: 1
Plan Prescribed Dose Per Fraction: 2 Gy
Plan Total Fractions Prescribed: 30
Plan Total Prescribed Dose: 60 Gy
Reference Point Dosage Given to Date: 2 Gy
Reference Point Session Dosage Given: 2 Gy
Session Number: 1

## 2023-08-17 NOTE — Telephone Encounter (Signed)
Wife [name]  states that [name] is doing fine. He is eating, drinking, and urinating well. They know to call the office at [phone] if they have any questions or concerns.

## 2023-08-17 NOTE — Telephone Encounter (Signed)
-----   Message from Nurse Threasa Beards sent at 08/15/2023  1:57 PM EDT ----- Regarding: [name] pt, first time Paclitaxel/Carboplatin Dr [name] pt came in 9/23 for first time Paclitaxel/Carboplatin. Had severe hypersensitivity reaction to Paclitaxel (see notes). Was unable to continue infusion. Tolerated Carboplatin without issue. Needs call back.

## 2023-08-18 ENCOUNTER — Ambulatory Visit
Admission: RE | Admit: 2023-08-18 | Discharge: 2023-08-18 | Disposition: A | Discharge status: Home / Self Care | Payer: 59 | Source: Ambulatory Visit | Attending: Radiation Oncology | Admitting: Radiation Oncology

## 2023-08-18 ENCOUNTER — Other Ambulatory Visit: Payer: Self-pay

## 2023-08-18 DIAGNOSIS — C3412 Malignant neoplasm of upper lobe, left bronchus or lung: Secondary | ICD-10-CM | POA: Diagnosis not present

## 2023-08-18 DIAGNOSIS — Z87891 Personal history of nicotine dependence: Secondary | ICD-10-CM | POA: Diagnosis not present

## 2023-08-18 DIAGNOSIS — Z510 Encounter for antineoplastic radiation therapy: Secondary | ICD-10-CM | POA: Diagnosis not present

## 2023-08-18 LAB — RAD ONC ARIA SESSION SUMMARY
Course Elapsed Days: 1
Plan Fractions Treated to Date: 2
Plan Prescribed Dose Per Fraction: 2 Gy
Plan Total Fractions Prescribed: 30
Plan Total Prescribed Dose: 60 Gy
Reference Point Dosage Given to Date: 4 Gy
Reference Point Session Dosage Given: 2 Gy
Session Number: 2

## 2023-08-19 ENCOUNTER — Ambulatory Visit
Admission: RE | Admit: 2023-08-19 | Discharge: 2023-08-19 | Disposition: A | Discharge status: Home / Self Care | Payer: 59 | Source: Ambulatory Visit | Attending: Radiation Oncology | Admitting: Radiation Oncology

## 2023-08-19 ENCOUNTER — Other Ambulatory Visit: Payer: Self-pay

## 2023-08-19 DIAGNOSIS — C3412 Malignant neoplasm of upper lobe, left bronchus or lung: Secondary | ICD-10-CM | POA: Diagnosis not present

## 2023-08-19 DIAGNOSIS — Z510 Encounter for antineoplastic radiation therapy: Secondary | ICD-10-CM | POA: Diagnosis not present

## 2023-08-19 DIAGNOSIS — Z87891 Personal history of nicotine dependence: Secondary | ICD-10-CM | POA: Diagnosis not present

## 2023-08-19 LAB — RAD ONC ARIA SESSION SUMMARY
Course Elapsed Days: 2
Plan Fractions Treated to Date: 3
Plan Prescribed Dose Per Fraction: 2 Gy
Plan Total Fractions Prescribed: 30
Plan Total Prescribed Dose: 60 Gy
Reference Point Dosage Given to Date: 6 Gy
Reference Point Session Dosage Given: 2 Gy
Session Number: 3

## 2023-08-19 LAB — ACID FAST CULTURE WITH REFLEXED SENSITIVITIES (MYCOBACTERIA): Acid Fast Culture: NEGATIVE

## 2023-08-22 ENCOUNTER — Ambulatory Visit
Admission: RE | Admit: 2023-08-22 | Discharge: 2023-08-22 | Disposition: A | Discharge status: Home / Self Care | Payer: 59 | Source: Ambulatory Visit | Attending: Radiation Oncology | Admitting: Radiation Oncology

## 2023-08-22 ENCOUNTER — Other Ambulatory Visit: Payer: Self-pay

## 2023-08-22 DIAGNOSIS — Z87891 Personal history of nicotine dependence: Secondary | ICD-10-CM | POA: Diagnosis not present

## 2023-08-22 DIAGNOSIS — C3412 Malignant neoplasm of upper lobe, left bronchus or lung: Secondary | ICD-10-CM | POA: Diagnosis not present

## 2023-08-22 DIAGNOSIS — I130 Hypertensive heart and chronic kidney disease with heart failure and stage 1 through stage 4 chronic kidney disease, or unspecified chronic kidney disease: Secondary | ICD-10-CM | POA: Diagnosis not present

## 2023-08-22 DIAGNOSIS — Z510 Encounter for antineoplastic radiation therapy: Secondary | ICD-10-CM | POA: Diagnosis not present

## 2023-08-22 DIAGNOSIS — E1122 Type 2 diabetes mellitus with diabetic chronic kidney disease: Secondary | ICD-10-CM | POA: Diagnosis not present

## 2023-08-22 DIAGNOSIS — E1169 Type 2 diabetes mellitus with other specified complication: Secondary | ICD-10-CM | POA: Diagnosis not present

## 2023-08-22 DIAGNOSIS — N189 Chronic kidney disease, unspecified: Secondary | ICD-10-CM | POA: Diagnosis not present

## 2023-08-22 DIAGNOSIS — I4891 Unspecified atrial fibrillation: Secondary | ICD-10-CM | POA: Diagnosis not present

## 2023-08-22 DIAGNOSIS — I739 Peripheral vascular disease, unspecified: Secondary | ICD-10-CM | POA: Diagnosis not present

## 2023-08-22 DIAGNOSIS — C3490 Malignant neoplasm of unspecified part of unspecified bronchus or lung: Secondary | ICD-10-CM | POA: Diagnosis not present

## 2023-08-22 LAB — RAD ONC ARIA SESSION SUMMARY
Course Elapsed Days: 5
Plan Fractions Treated to Date: 4
Plan Prescribed Dose Per Fraction: 2 Gy
Plan Total Fractions Prescribed: 30
Plan Total Prescribed Dose: 60 Gy
Reference Point Dosage Given to Date: 8 Gy
Reference Point Session Dosage Given: 2 Gy
Session Number: 4

## 2023-08-22 MED FILL — Dexamethasone Sodium Phosphate Inj 100 MG/10ML: INTRAMUSCULAR | Qty: 1 | Status: AC

## 2023-08-22 NOTE — Assessment & Plan Note (Signed)
Stable; No changes to plan today Continue Breo Ellipta 1 puff daily every morning (rinse mouth after use)  Follow-up: 4 months with Dr. Delton Coombes or sooner if needed

## 2023-08-22 NOTE — Assessment & Plan Note (Signed)
-   He underwent robotic assisted navigational bronchoscopy with endobronchial ultrasound by Dr. Delton Coombes on 07/05/2023.  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  Node biopsies were negative.  - Advised patient keep appointment for lymph biopsy on 07/27/2023 and appointment with oncology on 08/03/2023

## 2023-08-23 ENCOUNTER — Inpatient Hospital Stay (HOSPITAL_BASED_OUTPATIENT_CLINIC_OR_DEPARTMENT_OTHER): Payer: 59 | Admitting: Physician Assistant

## 2023-08-23 ENCOUNTER — Inpatient Hospital Stay: Payer: 59

## 2023-08-23 ENCOUNTER — Ambulatory Visit
Admission: RE | Admit: 2023-08-23 | Discharge: 2023-08-23 | Disposition: A | Discharge status: Home / Self Care | Payer: 59 | Source: Ambulatory Visit | Attending: Radiation Oncology | Admitting: Radiation Oncology

## 2023-08-23 ENCOUNTER — Other Ambulatory Visit: Payer: Self-pay

## 2023-08-23 ENCOUNTER — Ambulatory Visit: Admission: RE | Admit: 2023-08-23 | Payer: 59 | Source: Ambulatory Visit

## 2023-08-23 VITALS — BP 117/76 | HR 69 | Temp 98.2°F | Resp 13 | Wt 168.2 lb

## 2023-08-23 VITALS — BP 152/95 | HR 70 | Resp 14

## 2023-08-23 DIAGNOSIS — Z5111 Encounter for antineoplastic chemotherapy: Secondary | ICD-10-CM | POA: Insufficient documentation

## 2023-08-23 DIAGNOSIS — Z79633 Long term (current) use of mitotic inhibitor: Secondary | ICD-10-CM | POA: Insufficient documentation

## 2023-08-23 DIAGNOSIS — I129 Hypertensive chronic kidney disease with stage 1 through stage 4 chronic kidney disease, or unspecified chronic kidney disease: Secondary | ICD-10-CM | POA: Insufficient documentation

## 2023-08-23 DIAGNOSIS — Z7901 Long term (current) use of anticoagulants: Secondary | ICD-10-CM | POA: Insufficient documentation

## 2023-08-23 DIAGNOSIS — Z87442 Personal history of urinary calculi: Secondary | ICD-10-CM | POA: Insufficient documentation

## 2023-08-23 DIAGNOSIS — C3412 Malignant neoplasm of upper lobe, left bronchus or lung: Secondary | ICD-10-CM | POA: Insufficient documentation

## 2023-08-23 DIAGNOSIS — R0609 Other forms of dyspnea: Secondary | ICD-10-CM | POA: Insufficient documentation

## 2023-08-23 DIAGNOSIS — Z79630 Long term (current) use of alkylating agent: Secondary | ICD-10-CM | POA: Insufficient documentation

## 2023-08-23 DIAGNOSIS — R61 Generalized hyperhidrosis: Secondary | ICD-10-CM | POA: Insufficient documentation

## 2023-08-23 DIAGNOSIS — C773 Secondary and unspecified malignant neoplasm of axilla and upper limb lymph nodes: Secondary | ICD-10-CM | POA: Insufficient documentation

## 2023-08-23 DIAGNOSIS — Z7951 Long term (current) use of inhaled steroids: Secondary | ICD-10-CM | POA: Insufficient documentation

## 2023-08-23 DIAGNOSIS — R5383 Other fatigue: Secondary | ICD-10-CM | POA: Insufficient documentation

## 2023-08-23 DIAGNOSIS — Z79899 Other long term (current) drug therapy: Secondary | ICD-10-CM | POA: Insufficient documentation

## 2023-08-23 DIAGNOSIS — I252 Old myocardial infarction: Secondary | ICD-10-CM | POA: Insufficient documentation

## 2023-08-23 DIAGNOSIS — N1832 Chronic kidney disease, stage 3b: Secondary | ICD-10-CM | POA: Insufficient documentation

## 2023-08-23 DIAGNOSIS — R42 Dizziness and giddiness: Secondary | ICD-10-CM | POA: Insufficient documentation

## 2023-08-23 DIAGNOSIS — R519 Headache, unspecified: Secondary | ICD-10-CM | POA: Insufficient documentation

## 2023-08-23 DIAGNOSIS — R1110 Vomiting, unspecified: Secondary | ICD-10-CM | POA: Insufficient documentation

## 2023-08-23 DIAGNOSIS — Z510 Encounter for antineoplastic radiation therapy: Secondary | ICD-10-CM | POA: Diagnosis not present

## 2023-08-23 DIAGNOSIS — Z87891 Personal history of nicotine dependence: Secondary | ICD-10-CM | POA: Diagnosis not present

## 2023-08-23 LAB — CBC WITH DIFFERENTIAL (CANCER CENTER ONLY)
Abs Immature Granulocytes: 0.03 10*3/uL (ref 0.00–0.07)
Basophils Absolute: 0 10*3/uL (ref 0.0–0.1)
Basophils Relative: 1 %
Eosinophils Absolute: 0.2 10*3/uL (ref 0.0–0.5)
Eosinophils Relative: 5 %
HCT: 37.9 % — ABNORMAL LOW (ref 39.0–52.0)
Hemoglobin: 12.5 g/dL — ABNORMAL LOW (ref 13.0–17.0)
Immature Granulocytes: 1 %
Lymphocytes Relative: 27 %
Lymphs Abs: 0.9 10*3/uL (ref 0.7–4.0)
MCH: 30.5 pg (ref 26.0–34.0)
MCHC: 33 g/dL (ref 30.0–36.0)
MCV: 92.4 fL (ref 80.0–100.0)
Monocytes Absolute: 0.4 10*3/uL (ref 0.1–1.0)
Monocytes Relative: 14 %
Neutro Abs: 1.7 10*3/uL (ref 1.7–7.7)
Neutrophils Relative %: 52 %
Platelet Count: 129 10*3/uL — ABNORMAL LOW (ref 150–400)
RBC: 4.1 MIL/uL — ABNORMAL LOW (ref 4.22–5.81)
RDW: 12.4 % (ref 11.5–15.5)
WBC Count: 3.2 10*3/uL — ABNORMAL LOW (ref 4.0–10.5)
nRBC: 0 % (ref 0.0–0.2)

## 2023-08-23 LAB — RAD ONC ARIA SESSION SUMMARY
Course Elapsed Days: 6
Plan Fractions Treated to Date: 5
Plan Prescribed Dose Per Fraction: 2 Gy
Plan Total Fractions Prescribed: 30
Plan Total Prescribed Dose: 60 Gy
Reference Point Dosage Given to Date: 10 Gy
Reference Point Session Dosage Given: 2 Gy
Session Number: 5

## 2023-08-23 LAB — CMP (CANCER CENTER ONLY)
ALT: 9 U/L (ref 0–44)
AST: 15 U/L (ref 15–41)
Albumin: 3.7 g/dL (ref 3.5–5.0)
Alkaline Phosphatase: 53 U/L (ref 38–126)
Anion gap: 7 (ref 5–15)
BUN: 19 mg/dL (ref 8–23)
CO2: 24 mmol/L (ref 22–32)
Calcium: 9.5 mg/dL (ref 8.9–10.3)
Chloride: 107 mmol/L (ref 98–111)
Creatinine: 1.58 mg/dL — ABNORMAL HIGH (ref 0.61–1.24)
GFR, Estimated: 43 mL/min — ABNORMAL LOW (ref >60)
Glucose, Bld: 98 mg/dL (ref 70–99)
Potassium: 3.8 mmol/L (ref 3.5–5.1)
Sodium: 138 mmol/L (ref 135–145)
Total Bilirubin: 1.1 mg/dL (ref 0.3–1.2)
Total Protein: 7.2 g/dL (ref 6.5–8.1)

## 2023-08-23 MED ORDER — PACLITAXEL PROTEIN-BOUND CHEMO INJECTION 100 MG
100.0000 mg/m2 | Freq: Once | INTRAVENOUS | Status: AC
  Administered 2023-08-23: 200 mg via INTRAVENOUS
  Filled 2023-08-23: qty 40

## 2023-08-23 MED ORDER — PALONOSETRON HCL INJECTION 0.25 MG/5ML
0.2500 mg | Freq: Once | INTRAVENOUS | Status: AC
  Administered 2023-08-23: 0.25 mg via INTRAVENOUS
  Filled 2023-08-23: qty 5

## 2023-08-23 MED ORDER — SODIUM CHLORIDE 0.9 % IV SOLN: Freq: Once | INTRAVENOUS | Status: AC

## 2023-08-23 MED ORDER — SODIUM CHLORIDE 0.9 % IV SOLN
119.8000 mg | Freq: Once | INTRAVENOUS | Status: AC
  Administered 2023-08-23: 120 mg via INTRAVENOUS
  Filled 2023-08-23: qty 12

## 2023-08-23 MED ORDER — SODIUM CHLORIDE 0.9 % IV SOLN
10.0000 mg | Freq: Once | INTRAVENOUS | Status: AC
  Administered 2023-08-23: 10 mg via INTRAVENOUS
  Filled 2023-08-23: qty 10

## 2023-08-23 NOTE — Progress Notes (Signed)
Per Dr. Arbutus Ped OK to proceed with tx today w/ elevated SCR 1.58 mg/dL

## 2023-08-23 NOTE — Patient Instructions (Signed)
[facility]  Discharge Instructions: Thank you for choosing [facility] to provide your oncology and hematology care.   If you have a lab appointment with the Cancer Center, please go directly to the Cancer Center and check in at the registration area.   Wear comfortable clothing and clothing appropriate for easy access to any Portacath or PICC line.   We strive to give you quality time with your provider. You may need to reschedule your appointment if you arrive late (15 or more minutes).  Arriving late affects you and other patients whose appointments are after yours.  Also, if you miss three or more appointments without notifying the office, you may be dismissed from the clinic at the provider's discretion.      For prescription refill requests, have your pharmacy contact our office and allow 72 hours for refills to be completed.    Today you received the following chemotherapy and/or immunotherapy agents: Abraxane/Carboplatin      To help prevent nausea and vomiting after your treatment, we encourage you to take your nausea medication as directed.  BELOW ARE SYMPTOMS THAT SHOULD BE REPORTED IMMEDIATELY: *FEVER GREATER THAN 100.4 F (38 C) OR HIGHER *CHILLS OR SWEATING *NAUSEA AND VOMITING THAT IS NOT CONTROLLED WITH YOUR NAUSEA MEDICATION *UNUSUAL SHORTNESS OF BREATH *UNUSUAL BRUISING OR BLEEDING *URINARY PROBLEMS (pain or burning when urinating, or frequent urination) *BOWEL PROBLEMS (unusual diarrhea, constipation, pain near the anus) TENDERNESS IN MOUTH AND THROAT WITH OR WITHOUT PRESENCE OF ULCERS (sore throat, sores in mouth, or a toothache) UNUSUAL RASH, SWELLING OR PAIN  UNUSUAL VAGINAL DISCHARGE OR ITCHING   Items with * indicate a potential emergency and should be followed up as soon as possible or go to the Emergency Department if any problems should occur.  Please show the CHEMOTHERAPY ALERT CARD or IMMUNOTHERAPY ALERT  CARD at check-in to the Emergency Department and triage nurse.  Should you have questions after your visit or need to cancel or reschedule your appointment, please contact [facility]  Dept: [phone]  and follow the prompts.  Office hours are 8:00 a.m. to 4:30 p.m. Monday - Friday. Please note that voicemails left after 4:00 p.m. may not be returned until the following business day.  We are closed weekends and major holidays. You have access to a nurse at all times for urgent questions. Please call the main number to the clinic Dept: [phone] and follow the prompts.   For any non-urgent questions, you may also contact your provider using MyChart. We now offer e-Visits for anyone 18 and older to request care online for non-urgent symptoms. For details visit [email or web address].   Also download the MyChart app! Go to the app store, search "MyChart", open the app, select [facility], and log in with your MyChart username and password.   

## 2023-08-23 NOTE — Progress Notes (Signed)
During infusion appt today, Pt and family asked this RN about consuming sugar and sweets while undergoing tx for cancer. Pt's spouse stated, "His PCP told us to avoid sweets and sugars, and we have had to cut out a lot of cooking". This RN asked Pt if they would like to speak with a registered dietician at the [facility]. Pt agreed to see the RD. This RN placed nutrition consult and contacted Rosalita Chessman RD. Rosalita Chessman RD provided this RN with education materials to help with education regarding sugar consumption and cancer growth. This RN educated Pt and family and provided them handouts from [place] RD. Pt and family verbalized understanding and expressed gratitude over information provided.

## 2023-08-24 ENCOUNTER — Other Ambulatory Visit: Payer: Self-pay

## 2023-08-24 ENCOUNTER — Encounter: Payer: Self-pay | Admitting: Internal Medicine

## 2023-08-24 ENCOUNTER — Ambulatory Visit: Payer: 59

## 2023-08-24 ENCOUNTER — Ambulatory Visit
Admission: RE | Admit: 2023-08-24 | Discharge: 2023-08-24 | Disposition: A | Discharge status: Home / Self Care | Payer: 59 | Source: Ambulatory Visit | Attending: Radiation Oncology | Admitting: Radiation Oncology

## 2023-08-24 DIAGNOSIS — C3412 Malignant neoplasm of upper lobe, left bronchus or lung: Secondary | ICD-10-CM

## 2023-08-24 DIAGNOSIS — Z510 Encounter for antineoplastic radiation therapy: Secondary | ICD-10-CM | POA: Diagnosis not present

## 2023-08-24 DIAGNOSIS — Z87891 Personal history of nicotine dependence: Secondary | ICD-10-CM | POA: Diagnosis not present

## 2023-08-24 LAB — RAD ONC ARIA SESSION SUMMARY
Course Elapsed Days: 7
Plan Fractions Treated to Date: 6
Plan Prescribed Dose Per Fraction: 2 Gy
Plan Total Fractions Prescribed: 30
Plan Total Prescribed Dose: 60 Gy
Reference Point Dosage Given to Date: 12 Gy
Reference Point Session Dosage Given: 2 Gy
Session Number: 6

## 2023-08-24 MED ORDER — SONAFINE EX EMUL
1.0000 | Freq: Once | CUTANEOUS | Status: AC
  Administered 2023-08-24: 1 via TOPICAL

## 2023-08-24 NOTE — Addendum Note (Signed)
Addended by: Neita Goodnight on: 08/24/2023 09:08 AM   Modules accepted: Orders

## 2023-08-25 ENCOUNTER — Ambulatory Visit
Admission: RE | Admit: 2023-08-25 | Discharge: 2023-08-25 | Disposition: A | Discharge status: Home / Self Care | Payer: 59 | Source: Ambulatory Visit | Attending: Radiation Oncology | Admitting: Radiation Oncology

## 2023-08-25 ENCOUNTER — Other Ambulatory Visit: Payer: Self-pay

## 2023-08-25 DIAGNOSIS — Z510 Encounter for antineoplastic radiation therapy: Secondary | ICD-10-CM | POA: Diagnosis not present

## 2023-08-25 DIAGNOSIS — Z87891 Personal history of nicotine dependence: Secondary | ICD-10-CM | POA: Diagnosis not present

## 2023-08-25 DIAGNOSIS — C3412 Malignant neoplasm of upper lobe, left bronchus or lung: Secondary | ICD-10-CM

## 2023-08-25 LAB — RAD ONC ARIA SESSION SUMMARY
Course Elapsed Days: 8
Plan Fractions Treated to Date: 7
Plan Prescribed Dose Per Fraction: 2 Gy
Plan Total Fractions Prescribed: 30
Plan Total Prescribed Dose: 60 Gy
Reference Point Dosage Given to Date: 14 Gy
Reference Point Session Dosage Given: 2 Gy
Session Number: 7

## 2023-08-25 MED ORDER — RADIAPLEXRX EX GEL: Freq: Once | CUTANEOUS | Status: AC

## 2023-08-25 NOTE — Progress Notes (Addendum)
Patient reports sonafine cream caused pain and burning to skin when applied to chest.Patient states, " I felt like I was having a heart attack." Patient instructed  to stop using sonafine to chest.  Patient voiced understanding. Tube of Radiaplex given to patient. Sonafine added to allergy list.

## 2023-08-26 ENCOUNTER — Telehealth: Payer: Self-pay | Admitting: Dietician

## 2023-08-26 ENCOUNTER — Ambulatory Visit
Admission: RE | Admit: 2023-08-26 | Discharge: 2023-08-26 | Disposition: A | Discharge status: Home / Self Care | Payer: 59 | Source: Ambulatory Visit | Attending: Radiation Oncology | Admitting: Radiation Oncology

## 2023-08-26 ENCOUNTER — Other Ambulatory Visit: Payer: Self-pay

## 2023-08-26 DIAGNOSIS — C3412 Malignant neoplasm of upper lobe, left bronchus or lung: Secondary | ICD-10-CM | POA: Diagnosis not present

## 2023-08-26 DIAGNOSIS — Z510 Encounter for antineoplastic radiation therapy: Secondary | ICD-10-CM | POA: Diagnosis not present

## 2023-08-26 DIAGNOSIS — Z87891 Personal history of nicotine dependence: Secondary | ICD-10-CM | POA: Diagnosis not present

## 2023-08-26 LAB — RAD ONC ARIA SESSION SUMMARY
Course Elapsed Days: 9
Plan Fractions Treated to Date: 8
Plan Prescribed Dose Per Fraction: 2 Gy
Plan Total Fractions Prescribed: 30
Plan Total Prescribed Dose: 60 Gy
Reference Point Dosage Given to Date: 16 Gy
Reference Point Session Dosage Given: 2 Gy
Session Number: 8

## 2023-08-26 NOTE — Telephone Encounter (Signed)
Per IB Message on 08/23/23 I scheduled Nutrition appointment. Patient is aware of the date and time

## 2023-08-29 ENCOUNTER — Other Ambulatory Visit: Payer: Self-pay

## 2023-08-29 ENCOUNTER — Ambulatory Visit
Admission: RE | Admit: 2023-08-29 | Discharge: 2023-08-29 | Disposition: A | Discharge status: Home / Self Care | Payer: 59 | Source: Ambulatory Visit | Attending: Radiation Oncology | Admitting: Radiation Oncology

## 2023-08-29 DIAGNOSIS — C3412 Malignant neoplasm of upper lobe, left bronchus or lung: Secondary | ICD-10-CM | POA: Diagnosis not present

## 2023-08-29 DIAGNOSIS — Z510 Encounter for antineoplastic radiation therapy: Secondary | ICD-10-CM | POA: Diagnosis not present

## 2023-08-29 DIAGNOSIS — Z87891 Personal history of nicotine dependence: Secondary | ICD-10-CM | POA: Diagnosis not present

## 2023-08-29 LAB — RAD ONC ARIA SESSION SUMMARY
Course Elapsed Days: 12
Plan Fractions Treated to Date: 9
Plan Prescribed Dose Per Fraction: 2 Gy
Plan Total Fractions Prescribed: 30
Plan Total Prescribed Dose: 60 Gy
Reference Point Dosage Given to Date: 18 Gy
Reference Point Session Dosage Given: 2 Gy
Session Number: 9

## 2023-08-29 MED FILL — Dexamethasone Sodium Phosphate Inj 100 MG/10ML: INTRAMUSCULAR | Qty: 1 | Status: AC

## 2023-08-30 ENCOUNTER — Inpatient Hospital Stay: Payer: 59

## 2023-08-30 ENCOUNTER — Encounter: Payer: Self-pay | Admitting: Internal Medicine

## 2023-08-30 ENCOUNTER — Ambulatory Visit: Payer: 59

## 2023-08-30 DIAGNOSIS — C3412 Malignant neoplasm of upper lobe, left bronchus or lung: Secondary | ICD-10-CM

## 2023-08-30 LAB — CBC WITH DIFFERENTIAL (CANCER CENTER ONLY)
Abs Immature Granulocytes: 0.01 10*3/uL (ref 0.00–0.07)
Basophils Absolute: 0 10*3/uL (ref 0.0–0.1)
Basophils Relative: 1 %
Eosinophils Absolute: 0 10*3/uL (ref 0.0–0.5)
Eosinophils Relative: 3 %
HCT: 35.2 % — ABNORMAL LOW (ref 39.0–52.0)
Hemoglobin: 11.5 g/dL — ABNORMAL LOW (ref 13.0–17.0)
Immature Granulocytes: 1 %
Lymphocytes Relative: 31 %
Lymphs Abs: 0.5 10*3/uL — ABNORMAL LOW (ref 0.7–4.0)
MCH: 30.3 pg (ref 26.0–34.0)
MCHC: 32.7 g/dL (ref 30.0–36.0)
MCV: 92.9 fL (ref 80.0–100.0)
Monocytes Absolute: 0.1 10*3/uL (ref 0.1–1.0)
Monocytes Relative: 7 %
Neutro Abs: 0.9 10*3/uL — ABNORMAL LOW (ref 1.7–7.7)
Neutrophils Relative %: 57 %
Platelet Count: 104 10*3/uL — ABNORMAL LOW (ref 150–400)
RBC: 3.79 MIL/uL — ABNORMAL LOW (ref 4.22–5.81)
RDW: 12.3 % (ref 11.5–15.5)
Smear Review: NORMAL
WBC Count: 1.5 10*3/uL — ABNORMAL LOW (ref 4.0–10.5)
nRBC: 0 % (ref 0.0–0.2)

## 2023-08-30 LAB — CMP (CANCER CENTER ONLY)
ALT: 11 U/L (ref 0–44)
AST: 14 U/L — ABNORMAL LOW (ref 15–41)
Albumin: 3.6 g/dL (ref 3.5–5.0)
Alkaline Phosphatase: 49 U/L (ref 38–126)
Anion gap: 5 (ref 5–15)
BUN: 16 mg/dL (ref 8–23)
CO2: 26 mmol/L (ref 22–32)
Calcium: 9.1 mg/dL (ref 8.9–10.3)
Chloride: 107 mmol/L (ref 98–111)
Creatinine: 1.4 mg/dL — ABNORMAL HIGH (ref 0.61–1.24)
GFR, Estimated: 50 mL/min — ABNORMAL LOW (ref >60)
Glucose, Bld: 86 mg/dL (ref 70–99)
Potassium: 3.9 mmol/L (ref 3.5–5.1)
Sodium: 138 mmol/L (ref 135–145)
Total Bilirubin: 0.7 mg/dL (ref 0.3–1.2)
Total Protein: 7 g/dL (ref 6.5–8.1)

## 2023-08-30 NOTE — Progress Notes (Signed)
Decrease weekly Abraxane dose to 40mg /m2 per MD since given concurrently with weekly Carboplatin and XRT per MD.  Richardean Sale, RPH, BCPS, BCOP 08/30/2023 9:34 AM

## 2023-08-30 NOTE — Progress Notes (Signed)
Neutrophils 0.9. Arbutus Ped, MD notified. Per MD, no treatment today. Pt given copy of labs and verbalized understanding of txt time next Monday. Pt ambulated independently to lobby with family.

## 2023-08-31 ENCOUNTER — Ambulatory Visit
Admission: RE | Admit: 2023-08-31 | Discharge: 2023-08-31 | Discharge status: Home / Self Care | Payer: 59 | Source: Ambulatory Visit | Attending: Radiation Oncology | Admitting: Radiation Oncology

## 2023-08-31 ENCOUNTER — Other Ambulatory Visit: Payer: Self-pay

## 2023-08-31 ENCOUNTER — Ambulatory Visit
Admission: RE | Admit: 2023-08-31 | Discharge: 2023-08-31 | Disposition: A | Discharge status: Home / Self Care | Payer: 59 | Source: Ambulatory Visit | Attending: Radiation Oncology | Admitting: Radiation Oncology

## 2023-08-31 DIAGNOSIS — Z510 Encounter for antineoplastic radiation therapy: Secondary | ICD-10-CM | POA: Diagnosis not present

## 2023-08-31 DIAGNOSIS — C3412 Malignant neoplasm of upper lobe, left bronchus or lung: Secondary | ICD-10-CM | POA: Diagnosis not present

## 2023-08-31 DIAGNOSIS — Z87891 Personal history of nicotine dependence: Secondary | ICD-10-CM | POA: Diagnosis not present

## 2023-08-31 LAB — RAD ONC ARIA SESSION SUMMARY
Course Elapsed Days: 14
Plan Fractions Treated to Date: 10
Plan Prescribed Dose Per Fraction: 2 Gy
Plan Total Fractions Prescribed: 30
Plan Total Prescribed Dose: 60 Gy
Reference Point Dosage Given to Date: 20 Gy
Reference Point Session Dosage Given: 2 Gy
Session Number: 10

## 2023-09-01 ENCOUNTER — Ambulatory Visit
Admission: RE | Admit: 2023-09-01 | Discharge: 2023-09-01 | Disposition: A | Discharge status: Home / Self Care | Payer: 59 | Source: Ambulatory Visit | Attending: Radiation Oncology | Admitting: Radiation Oncology

## 2023-09-01 ENCOUNTER — Inpatient Hospital Stay: Payer: 59 | Admitting: Dietician

## 2023-09-01 ENCOUNTER — Other Ambulatory Visit: Payer: Self-pay

## 2023-09-01 DIAGNOSIS — Z87891 Personal history of nicotine dependence: Secondary | ICD-10-CM | POA: Diagnosis not present

## 2023-09-01 DIAGNOSIS — C3412 Malignant neoplasm of upper lobe, left bronchus or lung: Secondary | ICD-10-CM | POA: Diagnosis not present

## 2023-09-01 DIAGNOSIS — Z510 Encounter for antineoplastic radiation therapy: Secondary | ICD-10-CM | POA: Diagnosis not present

## 2023-09-01 LAB — RAD ONC ARIA SESSION SUMMARY
Course Elapsed Days: 15
Plan Fractions Treated to Date: 11
Plan Prescribed Dose Per Fraction: 2 Gy
Plan Total Fractions Prescribed: 30
Plan Total Prescribed Dose: 60 Gy
Reference Point Dosage Given to Date: 22 Gy
Reference Point Session Dosage Given: 2 Gy
Session Number: 11

## 2023-09-01 NOTE — Progress Notes (Signed)
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 He is receiving concurrent chemoradiation with weekly carboplatin/abraxane. Patient is under the care of Dr. Arbutus Ped and Dr. Roselind Messier.   Past medical history includes CHF, PVD, COPD, GERD, CAD, ICD in place, HLD  Met with patient in office prior to radiation. Patient reports tolerating concurrent therapy well. He denies nutrition impact symptoms at this time. Patient endorses good appetite. He is eating as normal and tolerating regular textures without difficulty. Patient typically eats 2 meals/day. Breakfast is 1 egg, 2 slices bacon, toast, coffee or sometimes bowl of cheerios. Recalls a variety of foods for dinner (stew beef/gravy, chicken, pork chops, rice, greens). Last night he had chicken and dumplings. Patient eats a banana everyday around 1PM. Patient reports drinking so much water that it keeps him going to the bathroom all night. He drinks Ensure occasionally when the "wife makes" him.   Nutrition Focused Physical Exam: deferred  Medications: eliquis, lasix, imdur, toprol, compazine, entresto   Labs: 10/8 - Cr 1.40  Anthropometrics:   Height: 5'7" Weight: 168 lb 8 oz  UBW: 170 lb  BMI: 26.39   NUTRITION DIAGNOSIS: Food and nutrition related knowledge deficit related to cancer and associated treatments as evidenced by no prior need for associated nutrition information   INTERVENTION:  Educated on importance of adequate calorie and protein energy intake to maintain strength/weights during treatment Educated on foods with protein, recommend protein source at every meal Discussed soft moist foods for ease of intake - handout with ideas provided  Patient agreeable to one Ensure/day  Contact information given   MONITORING, EVALUATION, GOAL: Patient will tolerate adequate calories and protein to minimize wt loss during treatment    Next Visit:  No follow-up scheduled at this time. Patient encouraged to contact with nutrition questions/concerns

## 2023-09-02 ENCOUNTER — Telehealth: Payer: Self-pay | Admitting: Cardiovascular Disease

## 2023-09-02 ENCOUNTER — Other Ambulatory Visit: Payer: Self-pay

## 2023-09-02 ENCOUNTER — Other Ambulatory Visit: Payer: Self-pay | Admitting: *Deleted

## 2023-09-02 ENCOUNTER — Other Ambulatory Visit: Payer: Self-pay | Admitting: Internal Medicine

## 2023-09-02 ENCOUNTER — Ambulatory Visit
Admission: RE | Admit: 2023-09-02 | Discharge: 2023-09-02 | Disposition: A | Discharge status: Home / Self Care | Payer: 59 | Source: Ambulatory Visit | Attending: Radiation Oncology | Admitting: Radiation Oncology

## 2023-09-02 DIAGNOSIS — Z87891 Personal history of nicotine dependence: Secondary | ICD-10-CM | POA: Diagnosis not present

## 2023-09-02 DIAGNOSIS — Z510 Encounter for antineoplastic radiation therapy: Secondary | ICD-10-CM | POA: Diagnosis not present

## 2023-09-02 DIAGNOSIS — C3412 Malignant neoplasm of upper lobe, left bronchus or lung: Secondary | ICD-10-CM | POA: Diagnosis not present

## 2023-09-02 LAB — RAD ONC ARIA SESSION SUMMARY
Course Elapsed Days: 16
Plan Fractions Treated to Date: 12
Plan Prescribed Dose Per Fraction: 2 Gy
Plan Total Fractions Prescribed: 30
Plan Total Prescribed Dose: 60 Gy
Reference Point Dosage Given to Date: 24 Gy
Reference Point Session Dosage Given: 2 Gy
Session Number: 12

## 2023-09-02 MED ORDER — SPIRONOLACTONE 25 MG PO TABS: 12.5000 mg | ORAL_TABLET | Freq: Every day | ORAL | 2 refills | Status: DC

## 2023-09-02 NOTE — Telephone Encounter (Signed)
*  STAT* If patient is at the pharmacy, call can be transferred to refill team.   1. Which medications need to be refilled? (please list name of each medication and dose if known)   spironolactone (ALDACTONE) 25 MG tablet    2. Which pharmacy/location (including street and city if local pharmacy) is medication to be sent to? [id number] - [place], [place] - [address] AT [address]    3. Do they need a 30 day or 90 day supply? 90 day

## 2023-09-05 ENCOUNTER — Other Ambulatory Visit: Payer: Self-pay

## 2023-09-05 ENCOUNTER — Inpatient Hospital Stay (HOSPITAL_BASED_OUTPATIENT_CLINIC_OR_DEPARTMENT_OTHER): Payer: 59 | Admitting: Internal Medicine

## 2023-09-05 ENCOUNTER — Inpatient Hospital Stay: Payer: 59

## 2023-09-05 ENCOUNTER — Telehealth: Payer: Self-pay | Admitting: Internal Medicine

## 2023-09-05 ENCOUNTER — Ambulatory Visit
Admission: RE | Admit: 2023-09-05 | Discharge: 2023-09-05 | Disposition: A | Discharge status: Home / Self Care | Payer: 59 | Source: Ambulatory Visit | Attending: Radiation Oncology | Admitting: Radiation Oncology

## 2023-09-05 VITALS — BP 137/81 | HR 75 | Temp 98.3°F | Resp 17

## 2023-09-05 DIAGNOSIS — C3412 Malignant neoplasm of upper lobe, left bronchus or lung: Secondary | ICD-10-CM

## 2023-09-05 DIAGNOSIS — T451X5A Adverse effect of antineoplastic and immunosuppressive drugs, initial encounter: Secondary | ICD-10-CM | POA: Diagnosis not present

## 2023-09-05 DIAGNOSIS — D701 Agranulocytosis secondary to cancer chemotherapy: Secondary | ICD-10-CM

## 2023-09-05 DIAGNOSIS — Z510 Encounter for antineoplastic radiation therapy: Secondary | ICD-10-CM | POA: Diagnosis not present

## 2023-09-05 DIAGNOSIS — Z87891 Personal history of nicotine dependence: Secondary | ICD-10-CM | POA: Diagnosis not present

## 2023-09-05 LAB — RAD ONC ARIA SESSION SUMMARY
Course Elapsed Days: 19
Plan Fractions Treated to Date: 13
Plan Prescribed Dose Per Fraction: 2 Gy
Plan Total Fractions Prescribed: 30
Plan Total Prescribed Dose: 60 Gy
Reference Point Dosage Given to Date: 26 Gy
Reference Point Session Dosage Given: 2 Gy
Session Number: 13

## 2023-09-05 LAB — CMP (CANCER CENTER ONLY)
ALT: 9 U/L (ref 0–44)
AST: 15 U/L (ref 15–41)
Albumin: 3.7 g/dL (ref 3.5–5.0)
Alkaline Phosphatase: 50 U/L (ref 38–126)
Anion gap: 6 (ref 5–15)
BUN: 15 mg/dL (ref 8–23)
CO2: 24 mmol/L (ref 22–32)
Calcium: 9.4 mg/dL (ref 8.9–10.3)
Chloride: 110 mmol/L (ref 98–111)
Creatinine: 1.42 mg/dL — ABNORMAL HIGH (ref 0.61–1.24)
GFR, Estimated: 49 mL/min — ABNORMAL LOW (ref >60)
Glucose, Bld: 112 mg/dL — ABNORMAL HIGH (ref 70–99)
Potassium: 4 mmol/L (ref 3.5–5.1)
Sodium: 140 mmol/L (ref 135–145)
Total Bilirubin: 1 mg/dL (ref 0.3–1.2)
Total Protein: 7.1 g/dL (ref 6.5–8.1)

## 2023-09-05 LAB — CBC WITH DIFFERENTIAL (CANCER CENTER ONLY)
Abs Immature Granulocytes: 0 10*3/uL (ref 0.00–0.07)
Basophils Absolute: 0 10*3/uL (ref 0.0–0.1)
Basophils Relative: 1 %
Eosinophils Absolute: 0 10*3/uL (ref 0.0–0.5)
Eosinophils Relative: 2 %
HCT: 36.8 % — ABNORMAL LOW (ref 39.0–52.0)
Hemoglobin: 12.4 g/dL — ABNORMAL LOW (ref 13.0–17.0)
Immature Granulocytes: 0 %
Lymphocytes Relative: 28 %
Lymphs Abs: 0.5 10*3/uL — ABNORMAL LOW (ref 0.7–4.0)
MCH: 30.6 pg (ref 26.0–34.0)
MCHC: 33.7 g/dL (ref 30.0–36.0)
MCV: 90.9 fL (ref 80.0–100.0)
Monocytes Absolute: 0.4 10*3/uL (ref 0.1–1.0)
Monocytes Relative: 23 %
Neutro Abs: 0.8 10*3/uL — ABNORMAL LOW (ref 1.7–7.7)
Neutrophils Relative %: 46 %
Platelet Count: 102 10*3/uL — ABNORMAL LOW (ref 150–400)
RBC: 4.05 MIL/uL — ABNORMAL LOW (ref 4.22–5.81)
RDW: 12.5 % (ref 11.5–15.5)
WBC Count: 1.7 10*3/uL — ABNORMAL LOW (ref 4.0–10.5)
nRBC: 0 % (ref 0.0–0.2)

## 2023-09-05 MED ORDER — FILGRASTIM-SNDZ 300 MCG/0.5ML IJ SOSY
300.0000 ug | PREFILLED_SYRINGE | Freq: Every day | INTRAMUSCULAR | Status: DC
  Administered 2023-09-05: 300 ug via SUBCUTANEOUS
  Filled 2023-09-05: qty 0.5

## 2023-09-05 NOTE — Progress Notes (Signed)
Chemotherapy cancelled today the patient received Zarxio.

## 2023-09-05 NOTE — Progress Notes (Signed)
[facility] [phone]   [phone]  OFFICE PROGRESS NOTE  [name], MD [address] [place] [address]  DIAGNOSIS: Stage IIIC/IV (T3, N3, M0) non-small cell lung cancer, adenocarcinoma. He presented with a left upper lobe spiculated mass in addition to lingular lesion and suspicious mediastinal lymphadenopathy He also has bilateral hypermetabolic axillary lymph nodes which could be related to metastatic disease versus inflammatory as the patient did have vaccines in both of his arms the week prior to his PET scan.    Molecular Studies: His PD-L1 expression was 1% and he has no actionable mutations.   PRIOR THERAPY: None   CURRENT THERAPY: Concurrent chemoradiation with carboplatin for an AUC of 2 and paclitaxel 45 mg/m.  First dose expected on 08/15/23.  Status post 2 cycles of first cycle was given with carboplatin and paclitaxel and starting from cycle #2 he was on carboplatin and Abraxane secondary to hypersensitivity reaction to paclitaxel.  INTERVAL HISTORY: [name] 82 y.o. male returns to the clinic today for follow-up visit accompanied by his wife.Discussed the use of AI scribe software for clinical note transcription with the patient, who gave verbal consent to proceed.  History of Present Illness   The patient, an 82 year old individual with stage three lung cancer, has been undergoing a combined regimen of chemotherapy and radiation. The chemotherapy involves two drugs, carboplatin and Abraxane, administered weekly alongside radiation. However, several treatments have been missed due to a persistently low white blood cell count, specifically a low absolute neutrophil count.  The patient reports that the radiation treatment is generally well-tolerated, with the primary side effect being fatigue and sleepiness. There are no reports of nausea, vomiting, or diarrhea. The patient does experience shortness of breath upon  exertion but denies any cough or hemoptysis. There have been instances of chills, but no fever. The patient's weight has remained stable throughout the treatment course.  The patient has a pacemaker but does not have a portocast, receiving chemotherapy through a peripheral IV. The patient's low white blood cell count, a side effect of the chemotherapy, has necessitated the administration of felgastrin injections to boost the count.       MEDICAL HISTORY: Past Medical History:  Diagnosis Date   AICD (automatic cardioverter/defibrillator) present    CAD (coronary artery disease) 80% stenosis diag of the LAD, 30% in OM2 branch of LCX in 2009    a. Nonobstructive CAD by cath 11/2011 with the exception of the pre-existing diagonal branch #2 lesion.   Chronic systolic CHF (congestive heart failure) (HCC)    CKD (chronic kidney disease) stage 3, GFR 30-59 ml/min (HCC)    Colon polyp, hyperplastic    History of kidney stones    History of stress test 06/01/2012   Normal myocardial perfusion study. compared to the previous study there is no significant change. this is a low risk scan   Hypertension    Legally blind    "both eyes"   Myocardial infarction [facility]) 11/22/2011   NICM (nonischemic cardiomyopathy) (HCC)    a. Remote hx of dilated NICM with EF ranging 20-45%, including normal EF by echo (55-60%) in 2014.   Peripheral arterial disease (HCC)    a. 06/2014: ABI right 0.99, left 1.2, LE dopplers revealing an occluded right posterior tibial. As symptoms were not felt r/t claudication, no further w/u at the time.   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 Requiring discontinuation of BB dose.   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  MEDICATIONS:  Current Outpatient  Medications  Medication Sig Dispense Refill   albuterol (VENTOLIN HFA) 108 (90 Base) MCG/ACT inhaler Inhale 1-2 puffs into the lungs every 6 (six) hours as needed for wheezing or shortness of breath.     apixaban (ELIQUIS) 2.5 MG TABS tablet Take 1 tablet (2.5 mg total) by mouth 2 (two) times daily. Okay to restart this medication on 07/06/2023     BREO ELLIPTA 100-25 MCG/INH AEPB Inhale 1 puff into the lungs every morning. (Patient not taking: Reported on 08/04/2023)     brimonidine (ALPHAGAN) 0.2 % ophthalmic solution Place 1 drop into both eyes 2 (two) times daily.     cycloSPORINE (RESTASIS) 0.05 % ophthalmic emulsion Place 1 drop into both eyes 2 (two) times daily.     diclofenac Sodium (VOLTAREN) 1 % GEL Apply 2 g topically 4 (four) times daily. (Patient not taking: Reported on 08/04/2023) 100 g 0   dorzolamide-timolol (COSOPT) 22.3-6.8 MG/ML ophthalmic solution Place 1 drop into both eyes 2 (two) times daily.     furosemide (LASIX) 20 MG tablet Take 1 tablet (20 mg total) by mouth See admin instructions. Take 20 mg by mouth EVERY OTHER MORNING     isosorbide mononitrate (IMDUR) 30 MG 24 hr tablet Take 30 mg by mouth daily. (Patient not taking: Reported on 08/04/2023)     metoprolol succinate (TOPROL-XL) 100 MG 24 hr tablet Take 1 tablet (100 mg total) by mouth in the morning. Take with or immediately following a meal.     nitroGLYCERIN (NITROSTAT) 0.4 MG SL tablet Place 1 tablet (0.4 mg total) under the tongue every 5 (five) minutes as needed for chest pain. 25 tablet 1   prochlorperazine (COMPAZINE) 10 MG tablet Take 1 tablet (10 mg total) by mouth every 6 (six) hours as needed. (Patient not taking: Reported on 08/04/2023) 30 tablet 2   ROCKLATAN 0.02-0.005 % SOLN Apply 1 drop to eye at bedtime.     sacubitril-valsartan (ENTRESTO) 24-26 MG Take 1 tablet by mouth 2 (two) times daily. 60 tablet 11   spironolactone (ALDACTONE) 25 MG tablet Take 0.5 tablets (12.5 mg total) by mouth daily. 90 tablet 2    No current facility-administered medications for this visit.    SURGICAL HISTORY:  Past Surgical History:  Procedure Laterality Date   BACK SURGERY     BIV UPGRADE N/A 11/08/2019   Procedure: BIV UPGRADE;  Surgeon: Marinus Maw, MD;  Location: [facility] INVASIVE CV LAB;  Service: Cardiovascular;  Laterality: N/A;   BRONCHIAL BIOPSY  07/05/2023   Procedure: BRONCHIAL BIOPSIES;  Surgeon: Leslye Peer, MD;  Location: [facility] ENDOSCOPY;  Service: Pulmonary;;   BRONCHIAL BRUSHINGS  07/05/2023   Procedure: BRONCHIAL BRUSHINGS;  Surgeon: Leslye Peer, MD;  Location: [facility] ENDOSCOPY;  Service: Pulmonary;;   BRONCHIAL NEEDLE ASPIRATION BIOPSY  07/05/2023   Procedure: BRONCHIAL NEEDLE ASPIRATION BIOPSIES;  Surgeon: Leslye Peer, MD;  Location: [facility] ENDOSCOPY;  Service: Pulmonary;;   BRONCHIAL WASHINGS  07/05/2023   Procedure: BRONCHIAL WASHINGS;  Surgeon: Leslye Peer, MD;  Location: [facility] ENDOSCOPY;  Service: Pulmonary;;   CARDIAC CATHETERIZATION  11/2011   CARDIAC CATHETERIZATION  11/2011   didn't demonstrate high grade obstructive disease to account for his LV dysfunction.  CATARACT EXTRACTION, BILATERAL  1990's   CYSTOSCOPY     CYSTOSCOPY WITH BIOPSY Bilateral 08/03/2021   Procedure: CYSTOSCOPY WITH BLADDER BIOPSY, BILATERAL RETROGRADE PYELOGRAM;  Surgeon: Crista Elliot, MD;  Location: [facility] ORS;  Service: Urology;  Laterality: Bilateral;  REQUESTING 45 MINS   CYSTOSCOPY WITH URETHRAL DILATATION N/A 05/04/2013   Procedure: CYSTOSCOPY WITH URETHRAL DILATATION;  Surgeon: Tia Alert, MD;  Location: [facility] NEURO ORS;  Service: Neurosurgery;  Laterality: N/A;  with insertion of foley catheter   EP study and ablation of VT  7/13   PVC focus mapped to the right coronary cusp of the aorta, limited ablation performed due to proximity of the focus to the right coronary artery   EYE SURGERY     FINE NEEDLE ASPIRATION  07/05/2023   Procedure: FINE NEEDLE ASPIRATION (FNA) LINEAR;  Surgeon: Leslye Peer, MD;   Location: [facility] ENDOSCOPY;  Service: Pulmonary;;   LEFT HEART CATH AND CORONARY ANGIOGRAPHY N/A 11/07/2019   Procedure: LEFT HEART CATH AND CORONARY ANGIOGRAPHY;  Surgeon: Iran Ouch, MD;  Location: [facility] INVASIVE CV LAB;  Service: Cardiovascular;  Laterality: N/A;   LEFT HEART CATHETERIZATION WITH CORONARY ANGIOGRAM N/A 11/25/2011   Procedure: LEFT HEART CATHETERIZATION WITH CORONARY ANGIOGRAM;  Surgeon: Marykay Lex, MD;  Location: [facility] CATH LAB;  Service: Cardiovascular;  Laterality: N/A;   PACEMAKER IMPLANT N/A 07/12/2018   Procedure: PACEMAKER IMPLANT;  Surgeon: Thurmon Fair, MD;  Location: [facility] INVASIVE CV LAB;  Service: Cardiovascular;  Laterality: N/A;   POSTERIOR FUSION LUMBAR SPINE  1979   ROTATOR CUFF REPAIR  2000's   left   V-TACH ABLATION N/A 06/06/2012   Procedure: V-TACH ABLATION;  Surgeon: Hillis Range, MD;  Location: [facility] CATH LAB;  Service: Cardiovascular;  Laterality: N/A;   VIDEO BRONCHOSCOPY WITH ENDOBRONCHIAL ULTRASOUND N/A 07/05/2023   Procedure: VIDEO BRONCHOSCOPY WITH ENDOBRONCHIAL ULTRASOUND;  Surgeon: Leslye Peer, MD;  Location: [facility] ENDOSCOPY;  Service: Pulmonary;  Laterality: N/A;    REVIEW OF SYSTEMS:  Constitutional: positive for fatigue Eyes: negative Ears, nose, mouth, throat, and face: negative Respiratory: positive for dyspnea on exertion Cardiovascular: negative Gastrointestinal: negative Genitourinary:negative Integument/breast: negative Hematologic/lymphatic: negative Musculoskeletal:negative Neurological: negative Behavioral/Psych: negative Endocrine: negative Allergic/Immunologic: negative   PHYSICAL EXAMINATION: General appearance: alert and cooperative Head: Normocephalic, without obvious abnormality, atraumatic Neck: no adenopathy, no JVD, supple, symmetrical, trachea midline, and thyroid not enlarged, symmetric, no tenderness/mass/nodules Lymph nodes: Cervical, supraclavicular, and axillary nodes normal. Resp: clear to auscultation  bilaterally Back: symmetric, no curvature. ROM normal. No CVA tenderness. Cardio: regular rate and rhythm, S1, S2 normal, no murmur, click, rub or gallop GI: soft, non-tender; bowel sounds normal; no masses,  no organomegaly Extremities: extremities normal, atraumatic, no cyanosis or edema Neurologic: Alert and oriented X 3, normal strength and tone. Normal symmetric reflexes. Normal coordination and gait  ECOG PERFORMANCE STATUS: 1 - Symptomatic but completely ambulatory  Blood pressure 137/81, pulse 75, temperature 98.3 F (36.8 C), temperature source Oral, resp. rate 17, SpO2 100%.  LABORATORY DATA: Lab Results  Component Value Date   WBC 1.7 (L) 09/05/2023   HGB 12.4 (L) 09/05/2023   HCT 36.8 (L) 09/05/2023   MCV 90.9 09/05/2023   PLT 102 (L) 09/05/2023      Chemistry      Component Value Date/Time   NA 138 08/30/2023 1130   NA 142 11/11/2022 1000   K 3.9 08/30/2023 1130   CL 107 08/30/2023 1130   CO2 26 08/30/2023 1130   BUN 16  08/30/2023 1130   BUN 18 11/11/2022 1000   CREATININE 1.40 (H) 08/30/2023 1130   CREATININE 1.37 (H) 01/24/2017 0950      Component Value Date/Time   CALCIUM 9.1 08/30/2023 1130   ALKPHOS 49 08/30/2023 1130   AST 14 (L) 08/30/2023 1130   ALT 11 08/30/2023 1130   BILITOT 0.7 08/30/2023 1130       RADIOGRAPHIC STUDIES: No results found.  ASSESSMENT AND PLAN: This is a very pleasant 82 years old African-American male with Stage IIIC/IV (T3, N3, M0) non-small cell lung cancer, adenocarcinoma. He presented with a left upper lobe spiculated mass in addition to lingular lesion and suspicious mediastinal lymphadenopathy. He started a course of concurrent chemoradiation initially with carboplatin for AUC of 2 and paclitaxel 45 Mg/M2 but paclitaxel was the changed to Abraxane starting from cycle #2 secondary to hypersensitivity reaction.  The patient received 2 cycles of concurrent chemoradiation.    Stage III Lung Cancer Undergoing concurrent  chemoradiation with Carboplatin and Abraxane. Missed several treatments due to low white blood cell count (absolute neutrophil count 900). No significant side effects from radiation reported, only fatigue. No new respiratory symptoms. -Administer Filgrastim injection for 3 days to boost white blood cell count. -Continue radiation as scheduled. -Check blood count next week before proceeding with chemotherapy.  General Health Maintenance / Followup Plans -Return next week for treatment and blood count check.   He was advised to call immediately if he has any concerning symptoms in the interval. The patient voices understanding of current disease status and treatment options and is in agreement with the current care plan.  All questions were answered. The patient knows to call the clinic with any problems, questions or concerns. We can certainly see the patient much sooner if necessary.  The total time spent in the appointment was 30 minutes.  Disclaimer: This note was dictated with voice recognition software. Similar sounding words can inadvertently be transcribed and may not be corrected upon review.

## 2023-09-06 ENCOUNTER — Ambulatory Visit: Payer: 59

## 2023-09-06 ENCOUNTER — Inpatient Hospital Stay: Payer: 59

## 2023-09-06 ENCOUNTER — Ambulatory Visit
Admission: RE | Admit: 2023-09-06 | Discharge: 2023-09-06 | Disposition: A | Discharge status: Home / Self Care | Payer: 59 | Source: Ambulatory Visit | Attending: Radiation Oncology | Admitting: Radiation Oncology

## 2023-09-06 ENCOUNTER — Other Ambulatory Visit: Payer: Self-pay

## 2023-09-06 ENCOUNTER — Other Ambulatory Visit: Payer: Self-pay | Admitting: Medical Oncology

## 2023-09-06 VITALS — BP 125/71 | HR 86 | Temp 98.0°F | Resp 16

## 2023-09-06 DIAGNOSIS — C3412 Malignant neoplasm of upper lobe, left bronchus or lung: Secondary | ICD-10-CM | POA: Diagnosis not present

## 2023-09-06 DIAGNOSIS — T451X5A Adverse effect of antineoplastic and immunosuppressive drugs, initial encounter: Secondary | ICD-10-CM

## 2023-09-06 DIAGNOSIS — Z510 Encounter for antineoplastic radiation therapy: Secondary | ICD-10-CM | POA: Diagnosis not present

## 2023-09-06 DIAGNOSIS — Z87891 Personal history of nicotine dependence: Secondary | ICD-10-CM | POA: Diagnosis not present

## 2023-09-06 LAB — RAD ONC ARIA SESSION SUMMARY
Course Elapsed Days: 20
Plan Fractions Treated to Date: 14
Plan Prescribed Dose Per Fraction: 2 Gy
Plan Total Fractions Prescribed: 30
Plan Total Prescribed Dose: 60 Gy
Reference Point Dosage Given to Date: 28 Gy
Reference Point Session Dosage Given: 2 Gy
Session Number: 14

## 2023-09-06 MED ORDER — FILGRASTIM-SNDZ 300 MCG/0.5ML IJ SOSY
300.0000 ug | PREFILLED_SYRINGE | Freq: Once | INTRAMUSCULAR | Status: AC
  Administered 2023-09-06: 300 ug via SUBCUTANEOUS
  Filled 2023-09-06: qty 0.5

## 2023-09-07 ENCOUNTER — Other Ambulatory Visit: Payer: Self-pay

## 2023-09-07 ENCOUNTER — Inpatient Hospital Stay: Payer: 59

## 2023-09-07 ENCOUNTER — Ambulatory Visit
Admission: RE | Admit: 2023-09-07 | Discharge: 2023-09-07 | Disposition: A | Discharge status: Home / Self Care | Payer: 59 | Source: Ambulatory Visit | Attending: Radiation Oncology | Admitting: Radiation Oncology

## 2023-09-07 VITALS — BP 135/79 | HR 78 | Temp 98.2°F | Resp 18

## 2023-09-07 DIAGNOSIS — Z510 Encounter for antineoplastic radiation therapy: Secondary | ICD-10-CM | POA: Diagnosis not present

## 2023-09-07 DIAGNOSIS — H401133 Primary open-angle glaucoma, bilateral, severe stage: Secondary | ICD-10-CM | POA: Diagnosis not present

## 2023-09-07 DIAGNOSIS — H401113 Primary open-angle glaucoma, right eye, severe stage: Secondary | ICD-10-CM | POA: Diagnosis not present

## 2023-09-07 DIAGNOSIS — T451X5A Adverse effect of antineoplastic and immunosuppressive drugs, initial encounter: Secondary | ICD-10-CM

## 2023-09-07 DIAGNOSIS — C3412 Malignant neoplasm of upper lobe, left bronchus or lung: Secondary | ICD-10-CM | POA: Diagnosis not present

## 2023-09-07 DIAGNOSIS — H47013 Ischemic optic neuropathy, bilateral: Secondary | ICD-10-CM | POA: Diagnosis not present

## 2023-09-07 DIAGNOSIS — Z87891 Personal history of nicotine dependence: Secondary | ICD-10-CM | POA: Diagnosis not present

## 2023-09-07 LAB — RAD ONC ARIA SESSION SUMMARY
Course Elapsed Days: 21
Plan Fractions Treated to Date: 15
Plan Prescribed Dose Per Fraction: 2 Gy
Plan Total Fractions Prescribed: 30
Plan Total Prescribed Dose: 60 Gy
Reference Point Dosage Given to Date: 30 Gy
Reference Point Session Dosage Given: 2 Gy
Session Number: 15

## 2023-09-07 MED ORDER — FILGRASTIM-SNDZ 300 MCG/0.5ML IJ SOSY: 300.0000 ug | PREFILLED_SYRINGE | Freq: Once | INTRAMUSCULAR | Status: DC

## 2023-09-07 MED ORDER — FILGRASTIM-SNDZ 300 MCG/0.5ML IJ SOSY
300.0000 ug | PREFILLED_SYRINGE | Freq: Once | INTRAMUSCULAR | Status: AC
  Administered 2023-09-07: 300 ug via SUBCUTANEOUS
  Filled 2023-09-07: qty 0.5

## 2023-09-08 ENCOUNTER — Ambulatory Visit
Admission: RE | Admit: 2023-09-08 | Discharge: 2023-09-08 | Disposition: A | Discharge status: Home / Self Care | Payer: 59 | Source: Ambulatory Visit | Attending: Radiation Oncology | Admitting: Radiation Oncology

## 2023-09-08 ENCOUNTER — Other Ambulatory Visit: Payer: Self-pay

## 2023-09-08 DIAGNOSIS — C3412 Malignant neoplasm of upper lobe, left bronchus or lung: Secondary | ICD-10-CM | POA: Diagnosis not present

## 2023-09-08 DIAGNOSIS — Z510 Encounter for antineoplastic radiation therapy: Secondary | ICD-10-CM | POA: Diagnosis not present

## 2023-09-08 DIAGNOSIS — Z87891 Personal history of nicotine dependence: Secondary | ICD-10-CM | POA: Diagnosis not present

## 2023-09-08 LAB — RAD ONC ARIA SESSION SUMMARY
Course Elapsed Days: 22
Plan Fractions Treated to Date: 16
Plan Prescribed Dose Per Fraction: 2 Gy
Plan Total Fractions Prescribed: 30
Plan Total Prescribed Dose: 60 Gy
Reference Point Dosage Given to Date: 32 Gy
Reference Point Session Dosage Given: 2 Gy
Session Number: 16

## 2023-09-09 ENCOUNTER — Ambulatory Visit
Admission: RE | Admit: 2023-09-09 | Discharge: 2023-09-09 | Disposition: A | Discharge status: Home / Self Care | Payer: 59 | Source: Ambulatory Visit | Attending: Radiation Oncology | Admitting: Radiation Oncology

## 2023-09-09 ENCOUNTER — Other Ambulatory Visit: Payer: Self-pay

## 2023-09-09 DIAGNOSIS — Z87891 Personal history of nicotine dependence: Secondary | ICD-10-CM | POA: Diagnosis not present

## 2023-09-09 DIAGNOSIS — C3412 Malignant neoplasm of upper lobe, left bronchus or lung: Secondary | ICD-10-CM | POA: Diagnosis not present

## 2023-09-09 DIAGNOSIS — Z510 Encounter for antineoplastic radiation therapy: Secondary | ICD-10-CM | POA: Diagnosis not present

## 2023-09-09 LAB — RAD ONC ARIA SESSION SUMMARY
Course Elapsed Days: 23
Plan Fractions Treated to Date: 17
Plan Prescribed Dose Per Fraction: 2 Gy
Plan Total Fractions Prescribed: 30
Plan Total Prescribed Dose: 60 Gy
Reference Point Dosage Given to Date: 34 Gy
Reference Point Session Dosage Given: 2 Gy
Session Number: 17

## 2023-09-12 ENCOUNTER — Other Ambulatory Visit: Payer: Self-pay

## 2023-09-12 ENCOUNTER — Ambulatory Visit
Admission: RE | Admit: 2023-09-12 | Discharge: 2023-09-12 | Disposition: A | Discharge status: Home / Self Care | Payer: 59 | Source: Ambulatory Visit | Attending: Radiation Oncology | Admitting: Radiation Oncology

## 2023-09-12 DIAGNOSIS — C3412 Malignant neoplasm of upper lobe, left bronchus or lung: Secondary | ICD-10-CM | POA: Diagnosis not present

## 2023-09-12 DIAGNOSIS — Z510 Encounter for antineoplastic radiation therapy: Secondary | ICD-10-CM | POA: Diagnosis not present

## 2023-09-12 DIAGNOSIS — Z87891 Personal history of nicotine dependence: Secondary | ICD-10-CM | POA: Diagnosis not present

## 2023-09-12 LAB — RAD ONC ARIA SESSION SUMMARY
Course Elapsed Days: 26
Plan Fractions Treated to Date: 18
Plan Prescribed Dose Per Fraction: 2 Gy
Plan Total Fractions Prescribed: 30
Plan Total Prescribed Dose: 60 Gy
Reference Point Dosage Given to Date: 36 Gy
Reference Point Session Dosage Given: 2 Gy
Session Number: 18

## 2023-09-13 ENCOUNTER — Inpatient Hospital Stay: Payer: 59

## 2023-09-13 ENCOUNTER — Other Ambulatory Visit: Payer: Self-pay

## 2023-09-13 ENCOUNTER — Ambulatory Visit
Admission: RE | Admit: 2023-09-13 | Discharge: 2023-09-13 | Disposition: A | Discharge status: Home / Self Care | Payer: 59 | Source: Ambulatory Visit | Attending: Radiation Oncology | Admitting: Radiation Oncology

## 2023-09-13 DIAGNOSIS — C3412 Malignant neoplasm of upper lobe, left bronchus or lung: Secondary | ICD-10-CM

## 2023-09-13 DIAGNOSIS — Z510 Encounter for antineoplastic radiation therapy: Secondary | ICD-10-CM | POA: Diagnosis not present

## 2023-09-13 DIAGNOSIS — Z87891 Personal history of nicotine dependence: Secondary | ICD-10-CM | POA: Diagnosis not present

## 2023-09-13 LAB — RAD ONC ARIA SESSION SUMMARY
Course Elapsed Days: 27
Plan Fractions Treated to Date: 19
Plan Prescribed Dose Per Fraction: 2 Gy
Plan Total Fractions Prescribed: 30
Plan Total Prescribed Dose: 60 Gy
Reference Point Dosage Given to Date: 38 Gy
Reference Point Session Dosage Given: 2 Gy
Session Number: 19

## 2023-09-13 LAB — CBC WITH DIFFERENTIAL (CANCER CENTER ONLY)
Abs Immature Granulocytes: 0.07 10*3/uL (ref 0.00–0.07)
Basophils Absolute: 0 10*3/uL (ref 0.0–0.1)
Basophils Relative: 1 %
Eosinophils Absolute: 0.1 10*3/uL (ref 0.0–0.5)
Eosinophils Relative: 3 %
HCT: 35 % — ABNORMAL LOW (ref 39.0–52.0)
Hemoglobin: 12 g/dL — ABNORMAL LOW (ref 13.0–17.0)
Immature Granulocytes: 3 %
Lymphocytes Relative: 14 %
Lymphs Abs: 0.3 10*3/uL — ABNORMAL LOW (ref 0.7–4.0)
MCH: 31 pg (ref 26.0–34.0)
MCHC: 34.3 g/dL (ref 30.0–36.0)
MCV: 90.4 fL (ref 80.0–100.0)
Monocytes Absolute: 0.5 10*3/uL (ref 0.1–1.0)
Monocytes Relative: 18 %
Neutro Abs: 1.5 10*3/uL — ABNORMAL LOW (ref 1.7–7.7)
Neutrophils Relative %: 61 %
Platelet Count: 76 10*3/uL — ABNORMAL LOW (ref 150–400)
RBC: 3.87 MIL/uL — ABNORMAL LOW (ref 4.22–5.81)
RDW: 13.3 % (ref 11.5–15.5)
WBC Count: 2.4 10*3/uL — ABNORMAL LOW (ref 4.0–10.5)
nRBC: 0 % (ref 0.0–0.2)

## 2023-09-13 LAB — CMP (CANCER CENTER ONLY)
ALT: 10 U/L (ref 0–44)
AST: 13 U/L — ABNORMAL LOW (ref 15–41)
Albumin: 3.8 g/dL (ref 3.5–5.0)
Alkaline Phosphatase: 56 U/L (ref 38–126)
Anion gap: 6 (ref 5–15)
BUN: 16 mg/dL (ref 8–23)
CO2: 26 mmol/L (ref 22–32)
Calcium: 9.4 mg/dL (ref 8.9–10.3)
Chloride: 107 mmol/L (ref 98–111)
Creatinine: 1.44 mg/dL — ABNORMAL HIGH (ref 0.61–1.24)
GFR, Estimated: 48 mL/min — ABNORMAL LOW (ref >60)
Glucose, Bld: 97 mg/dL (ref 70–99)
Potassium: 4 mmol/L (ref 3.5–5.1)
Sodium: 139 mmol/L (ref 135–145)
Total Bilirubin: 0.8 mg/dL (ref 0.3–1.2)
Total Protein: 7.2 g/dL (ref 6.5–8.1)

## 2023-09-13 NOTE — Progress Notes (Signed)
Pt. Platelets 76 K/uL per Dr. Arbutus Ped, no treatment today and pt. instructed to keep next week appointment as scheduled.

## 2023-09-14 ENCOUNTER — Other Ambulatory Visit: Payer: Self-pay

## 2023-09-14 ENCOUNTER — Ambulatory Visit
Admission: RE | Admit: 2023-09-14 | Discharge: 2023-09-14 | Disposition: A | Discharge status: Home / Self Care | Payer: 59 | Source: Ambulatory Visit | Attending: Radiation Oncology | Admitting: Radiation Oncology

## 2023-09-14 DIAGNOSIS — Z510 Encounter for antineoplastic radiation therapy: Secondary | ICD-10-CM | POA: Diagnosis not present

## 2023-09-14 DIAGNOSIS — Z87891 Personal history of nicotine dependence: Secondary | ICD-10-CM | POA: Diagnosis not present

## 2023-09-14 DIAGNOSIS — C3412 Malignant neoplasm of upper lobe, left bronchus or lung: Secondary | ICD-10-CM | POA: Diagnosis not present

## 2023-09-14 LAB — RAD ONC ARIA SESSION SUMMARY
Course Elapsed Days: 28
Plan Fractions Treated to Date: 20
Plan Prescribed Dose Per Fraction: 2 Gy
Plan Total Fractions Prescribed: 30
Plan Total Prescribed Dose: 60 Gy
Reference Point Dosage Given to Date: 40 Gy
Reference Point Session Dosage Given: 2 Gy
Session Number: 20

## 2023-09-15 ENCOUNTER — Ambulatory Visit: Payer: 59

## 2023-09-16 ENCOUNTER — Ambulatory Visit
Admission: RE | Admit: 2023-09-16 | Discharge: 2023-09-16 | Disposition: A | Discharge status: Home / Self Care | Payer: 59 | Source: Ambulatory Visit | Attending: Radiation Oncology | Admitting: Radiation Oncology

## 2023-09-16 ENCOUNTER — Other Ambulatory Visit: Payer: Self-pay

## 2023-09-16 DIAGNOSIS — C3412 Malignant neoplasm of upper lobe, left bronchus or lung: Secondary | ICD-10-CM | POA: Diagnosis not present

## 2023-09-16 DIAGNOSIS — Z87891 Personal history of nicotine dependence: Secondary | ICD-10-CM | POA: Diagnosis not present

## 2023-09-16 DIAGNOSIS — Z510 Encounter for antineoplastic radiation therapy: Secondary | ICD-10-CM | POA: Diagnosis not present

## 2023-09-16 LAB — RAD ONC ARIA SESSION SUMMARY
Course Elapsed Days: 30
Plan Fractions Treated to Date: 21
Plan Prescribed Dose Per Fraction: 2 Gy
Plan Total Fractions Prescribed: 30
Plan Total Prescribed Dose: 60 Gy
Reference Point Dosage Given to Date: 42 Gy
Reference Point Session Dosage Given: 2 Gy
Session Number: 21

## 2023-09-19 ENCOUNTER — Ambulatory Visit
Admission: RE | Admit: 2023-09-19 | Discharge: 2023-09-19 | Disposition: A | Discharge status: Home / Self Care | Payer: 59 | Source: Ambulatory Visit | Attending: Radiation Oncology | Admitting: Radiation Oncology

## 2023-09-19 ENCOUNTER — Other Ambulatory Visit: Payer: Self-pay

## 2023-09-19 DIAGNOSIS — Z87891 Personal history of nicotine dependence: Secondary | ICD-10-CM | POA: Diagnosis not present

## 2023-09-19 DIAGNOSIS — C3412 Malignant neoplasm of upper lobe, left bronchus or lung: Secondary | ICD-10-CM | POA: Diagnosis not present

## 2023-09-19 DIAGNOSIS — Z510 Encounter for antineoplastic radiation therapy: Secondary | ICD-10-CM | POA: Diagnosis not present

## 2023-09-19 LAB — RAD ONC ARIA SESSION SUMMARY
Course Elapsed Days: 33
Plan Fractions Treated to Date: 22
Plan Prescribed Dose Per Fraction: 2 Gy
Plan Total Fractions Prescribed: 30
Plan Total Prescribed Dose: 60 Gy
Reference Point Dosage Given to Date: 44 Gy
Reference Point Session Dosage Given: 2 Gy
Session Number: 22

## 2023-09-20 ENCOUNTER — Other Ambulatory Visit: Payer: Self-pay

## 2023-09-20 ENCOUNTER — Encounter: Payer: Self-pay | Admitting: Internal Medicine

## 2023-09-20 ENCOUNTER — Inpatient Hospital Stay: Payer: 59

## 2023-09-20 ENCOUNTER — Inpatient Hospital Stay (HOSPITAL_BASED_OUTPATIENT_CLINIC_OR_DEPARTMENT_OTHER): Payer: 59 | Admitting: Internal Medicine

## 2023-09-20 ENCOUNTER — Ambulatory Visit
Admission: RE | Admit: 2023-09-20 | Discharge: 2023-09-20 | Disposition: A | Discharge status: Home / Self Care | Payer: 59 | Source: Ambulatory Visit | Attending: Radiation Oncology | Admitting: Radiation Oncology

## 2023-09-20 VITALS — BP 161/84 | HR 71 | Temp 97.8°F | Resp 18

## 2023-09-20 VITALS — BP 128/81 | HR 69 | Temp 97.5°F | Resp 18 | Wt 167.4 lb

## 2023-09-20 DIAGNOSIS — Z510 Encounter for antineoplastic radiation therapy: Secondary | ICD-10-CM | POA: Diagnosis not present

## 2023-09-20 DIAGNOSIS — C3412 Malignant neoplasm of upper lobe, left bronchus or lung: Secondary | ICD-10-CM | POA: Diagnosis not present

## 2023-09-20 DIAGNOSIS — C3490 Malignant neoplasm of unspecified part of unspecified bronchus or lung: Secondary | ICD-10-CM

## 2023-09-20 DIAGNOSIS — Z87891 Personal history of nicotine dependence: Secondary | ICD-10-CM | POA: Diagnosis not present

## 2023-09-20 LAB — CMP (CANCER CENTER ONLY)
ALT: 9 U/L (ref 0–44)
AST: 15 U/L (ref 15–41)
Albumin: 3.9 g/dL (ref 3.5–5.0)
Alkaline Phosphatase: 50 U/L (ref 38–126)
Anion gap: 5 (ref 5–15)
BUN: 17 mg/dL (ref 8–23)
CO2: 26 mmol/L (ref 22–32)
Calcium: 9.6 mg/dL (ref 8.9–10.3)
Chloride: 108 mmol/L (ref 98–111)
Creatinine: 1.39 mg/dL — ABNORMAL HIGH (ref 0.61–1.24)
GFR, Estimated: 50 mL/min — ABNORMAL LOW (ref >60)
Glucose, Bld: 113 mg/dL — ABNORMAL HIGH (ref 70–99)
Potassium: 4.4 mmol/L (ref 3.5–5.1)
Sodium: 139 mmol/L (ref 135–145)
Total Bilirubin: 0.7 mg/dL (ref 0.3–1.2)
Total Protein: 7.4 g/dL (ref 6.5–8.1)

## 2023-09-20 LAB — RAD ONC ARIA SESSION SUMMARY
Course Elapsed Days: 34
Plan Fractions Treated to Date: 23
Plan Prescribed Dose Per Fraction: 2 Gy
Plan Total Fractions Prescribed: 30
Plan Total Prescribed Dose: 60 Gy
Reference Point Dosage Given to Date: 46 Gy
Reference Point Session Dosage Given: 2 Gy
Session Number: 23

## 2023-09-20 LAB — CBC WITH DIFFERENTIAL (CANCER CENTER ONLY)
Abs Immature Granulocytes: 0.01 10*3/uL (ref 0.00–0.07)
Basophils Absolute: 0 10*3/uL (ref 0.0–0.1)
Basophils Relative: 0 %
Eosinophils Absolute: 0.1 10*3/uL (ref 0.0–0.5)
Eosinophils Relative: 4 %
HCT: 35.6 % — ABNORMAL LOW (ref 39.0–52.0)
Hemoglobin: 11.9 g/dL — ABNORMAL LOW (ref 13.0–17.0)
Immature Granulocytes: 0 %
Lymphocytes Relative: 10 %
Lymphs Abs: 0.3 10*3/uL — ABNORMAL LOW (ref 0.7–4.0)
MCH: 31.1 pg (ref 26.0–34.0)
MCHC: 33.4 g/dL (ref 30.0–36.0)
MCV: 93 fL (ref 80.0–100.0)
Monocytes Absolute: 0.3 10*3/uL (ref 0.1–1.0)
Monocytes Relative: 11 %
Neutro Abs: 1.9 10*3/uL (ref 1.7–7.7)
Neutrophils Relative %: 75 %
Platelet Count: 99 10*3/uL — ABNORMAL LOW (ref 150–400)
RBC: 3.83 MIL/uL — ABNORMAL LOW (ref 4.22–5.81)
RDW: 13.8 % (ref 11.5–15.5)
WBC Count: 2.6 10*3/uL — ABNORMAL LOW (ref 4.0–10.5)
nRBC: 0 % (ref 0.0–0.2)

## 2023-09-20 MED ORDER — PALONOSETRON HCL INJECTION 0.25 MG/5ML
0.2500 mg | Freq: Once | INTRAVENOUS | Status: AC
  Administered 2023-09-20: 0.25 mg via INTRAVENOUS
  Filled 2023-09-20: qty 5

## 2023-09-20 MED ORDER — PACLITAXEL PROTEIN-BOUND CHEMO INJECTION 100 MG
40.0000 mg/m2 | Freq: Once | INTRAVENOUS | Status: AC
  Administered 2023-09-20: 75 mg via INTRAVENOUS
  Filled 2023-09-20: qty 15

## 2023-09-20 MED ORDER — CARBOPLATIN CHEMO INJECTION 450 MG/45ML
119.8000 mg | Freq: Once | INTRAVENOUS | Status: AC
  Administered 2023-09-20: 120 mg via INTRAVENOUS
  Filled 2023-09-20: qty 12

## 2023-09-20 MED ORDER — SODIUM CHLORIDE 0.9 % IV SOLN: Freq: Once | INTRAVENOUS | Status: AC

## 2023-09-20 MED ORDER — DEXAMETHASONE SODIUM PHOSPHATE 10 MG/ML IJ SOLN
10.0000 mg | Freq: Once | INTRAMUSCULAR | Status: AC
  Administered 2023-09-20: 10 mg via INTRAVENOUS
  Filled 2023-09-20: qty 1

## 2023-09-20 NOTE — Progress Notes (Signed)
[facility] [phone]   [phone]  OFFICE PROGRESS NOTE  [name], MD [address] [place] [address]  DIAGNOSIS: Stage IIIC/IV (T3, N3, M0) non-small cell lung cancer, adenocarcinoma. He presented with a left upper lobe spiculated mass in addition to lingular lesion and suspicious mediastinal lymphadenopathy He also has bilateral hypermetabolic axillary lymph nodes which could be related to metastatic disease versus inflammatory as the patient did have vaccines in both of his arms the week prior to his PET scan.    Molecular Studies: His PD-L1 expression was 1% and he has no actionable mutations.   PRIOR THERAPY: None   CURRENT THERAPY: Concurrent chemoradiation with carboplatin for an AUC of 2 and paclitaxel 45 mg/m.  First dose expected on 08/15/23.  Status post 2 cycles of first cycle was given with carboplatin and paclitaxel and starting from cycle #2 he was on carboplatin and Abraxane secondary to hypersensitivity reaction to paclitaxel.  INTERVAL HISTORY: [name] 82 y.o. male returns to the clinic today for follow-up visit accompanied by his wife.Discussed the use of AI scribe software for clinical note transcription with the patient, who gave verbal consent to proceed.  History of Present Illness   The patient, an 82 year old with a diagnosis of stage 3C non-small cell lung cancer (adenocarcinoma), has been undergoing concurrent chemotherapy and radiation therapy. The last radiation session is scheduled for the following week. However, several chemotherapy cycles were missed due to persistently low platelet counts.  The patient reports feeling "pretty good" with no new complaints since the last visit. He denies experiencing nausea, vomiting, or diarrhea. However, he does report occasional headaches and episodes of dizziness. Additionally, he notes excessive sweating.  The patient's appetite remains good, with no reported  swallowing difficulties. However, he does mention occasional episodes of vomiting, which he attributes to the effects of radiation therapy. The patient's weight has remained stable. The dizziness is described as intermittent and seems to occur with sudden movements, suggesting a possible change in blood pressure.       MEDICAL HISTORY: Past Medical History:  Diagnosis Date   AICD (automatic cardioverter/defibrillator) present    CAD (coronary artery disease) 80% stenosis diag of the LAD, 30% in OM2 branch of LCX in 2009    a. Nonobstructive CAD by cath 11/2011 with the exception of the pre-existing diagonal branch #2 lesion.   Chronic systolic CHF (congestive heart failure) (HCC)    CKD (chronic kidney disease) stage 3, GFR 30-59 ml/min (HCC)    Colon polyp, hyperplastic    History of kidney stones    History of stress test 06/01/2012   Normal myocardial perfusion study. compared to the previous study there is no significant change. this is a low risk scan   Hypertension    Legally blind    "both eyes"   Myocardial infarction [facility]) 11/22/2011   NICM (nonischemic cardiomyopathy) (HCC)    a. Remote hx of dilated NICM with EF ranging 20-45%, including normal EF by echo (55-60%) in 2014.   Peripheral arterial disease (HCC)    a. 06/2014: ABI right 0.99, left 1.2, LE dopplers revealing an occluded right posterior tibial. As symptoms were not felt r/t claudication, no further w/u at the time.   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 Requiring discontinuation of BB dose.   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  MEDICATIONS:  Current Outpatient Medications  Medication Sig Dispense Refill   albuterol (VENTOLIN HFA) 108 (90 Base) MCG/ACT inhaler Inhale 1-2 puffs into  the lungs every 6 (six) hours as needed for wheezing or shortness of breath.     apixaban (ELIQUIS) 2.5 MG TABS tablet Take 1 tablet (2.5 mg total) by mouth 2 (two) times daily. Okay to restart this medication on 07/06/2023     BREO ELLIPTA 100-25 MCG/INH AEPB Inhale 1 puff into the lungs every morning. (Patient not taking: Reported on 08/04/2023)     brimonidine (ALPHAGAN) 0.2 % ophthalmic solution Place 1 drop into both eyes 2 (two) times daily.     cycloSPORINE (RESTASIS) 0.05 % ophthalmic emulsion Place 1 drop into both eyes 2 (two) times daily.     diclofenac Sodium (VOLTAREN) 1 % GEL Apply 2 g topically 4 (four) times daily. (Patient not taking: Reported on 08/04/2023) 100 g 0   dorzolamide-timolol (COSOPT) 22.3-6.8 MG/ML ophthalmic solution Place 1 drop into both eyes 2 (two) times daily.     furosemide (LASIX) 20 MG tablet Take 1 tablet (20 mg total) by mouth See admin instructions. Take 20 mg by mouth EVERY OTHER MORNING     isosorbide mononitrate (IMDUR) 30 MG 24 hr tablet Take 30 mg by mouth daily. (Patient not taking: Reported on 08/04/2023)     metoprolol succinate (TOPROL-XL) 100 MG 24 hr tablet Take 1 tablet (100 mg total) by mouth in the morning. Take with or immediately following a meal.     nitroGLYCERIN (NITROSTAT) 0.4 MG SL tablet Place 1 tablet (0.4 mg total) under the tongue every 5 (five) minutes as needed for chest pain. 25 tablet 1   prochlorperazine (COMPAZINE) 10 MG tablet Take 1 tablet (10 mg total) by mouth every 6 (six) hours as needed. (Patient not taking: Reported on 08/04/2023) 30 tablet 2   ROCKLATAN 0.02-0.005 % SOLN Apply 1 drop to eye at bedtime.     sacubitril-valsartan (ENTRESTO) 24-26 MG Take 1 tablet by mouth 2 (two) times daily. 60 tablet 11   spironolactone (ALDACTONE) 25 MG tablet Take 0.5 tablets (12.5 mg total) by mouth daily. 90 tablet 2   No current facility-administered medications for this visit.    SURGICAL HISTORY:  Past Surgical History:  Procedure  Laterality Date   BACK SURGERY     BIV UPGRADE N/A 11/08/2019   Procedure: BIV UPGRADE;  Surgeon: Marinus Maw, MD;  Location: [facility] INVASIVE CV LAB;  Service: Cardiovascular;  Laterality: N/A;   BRONCHIAL BIOPSY  07/05/2023   Procedure: BRONCHIAL BIOPSIES;  Surgeon: Leslye Peer, MD;  Location: [facility] ENDOSCOPY;  Service: Pulmonary;;   BRONCHIAL BRUSHINGS  07/05/2023   Procedure: BRONCHIAL BRUSHINGS;  Surgeon: Leslye Peer, MD;  Location: [facility] ENDOSCOPY;  Service: Pulmonary;;   BRONCHIAL NEEDLE ASPIRATION BIOPSY  07/05/2023   Procedure: BRONCHIAL NEEDLE ASPIRATION BIOPSIES;  Surgeon: Leslye Peer, MD;  Location: [facility] ENDOSCOPY;  Service: Pulmonary;;   BRONCHIAL WASHINGS  07/05/2023   Procedure: BRONCHIAL WASHINGS;  Surgeon: Leslye Peer, MD;  Location[facility] ENDOSCOPY;  Service: Pulmonary;;   CARDIAC CATHETERIZATION  11/2011   CARDIAC CATHETERIZATION  11/2011   didn't demonstrate high grade obstructive disease to account for his LV dysfunction.   CATARACT EXTRACTION, BILATERAL  1990's   CYSTOSCOPY     CYSTOSCOPY WITH BIOPSY Bilateral 08/03/2021   Procedure: CYSTOSCOPY WITH  BLADDER BIOPSY, BILATERAL RETROGRADE PYELOGRAM;  Surgeon: Crista Elliot, MD;  Location: [facility] ORS;  Service: Urology;  Laterality: Bilateral;  REQUESTING 45 MINS   CYSTOSCOPY WITH URETHRAL DILATATION N/A 05/04/2013   Procedure: CYSTOSCOPY WITH URETHRAL DILATATION;  Surgeon: Tia Alert, MD;  Location: [facility] NEURO ORS;  Service: Neurosurgery;  Laterality: N/A;  with insertion of foley catheter   EP study and ablation of VT  7/13   PVC focus mapped to the right coronary cusp of the aorta, limited ablation performed due to proximity of the focus to the right coronary artery   EYE SURGERY     FINE NEEDLE ASPIRATION  07/05/2023   Procedure: FINE NEEDLE ASPIRATION (FNA) LINEAR;  Surgeon: Leslye Peer, MD;  Location: [facility] ENDOSCOPY;  Service: Pulmonary;;   LEFT HEART CATH AND CORONARY ANGIOGRAPHY N/A 11/07/2019   Procedure: LEFT  HEART CATH AND CORONARY ANGIOGRAPHY;  Surgeon: Iran Ouch, MD;  Location: [facility] CV LAB;  Service: Cardiovascular;  Laterality: N/A;   LEFT HEART CATHETERIZATION WITH CORONARY ANGIOGRAM N/A 11/25/2011   Procedure: LEFT HEART CATHETERIZATION WITH CORONARY ANGIOGRAM;  Surgeon: Marykay Lex, MD;  Location: [facility] CATH LAB;  Service: Cardiovascular;  Laterality: N/A;   PACEMAKER IMPLANT N/A 07/12/2018   Procedure: PACEMAKER IMPLANT;  Surgeon: Thurmon Fair, MD;  Location: [facility]VAS[facility] CV LAB;  Service: Cardiovascular;  Laterality: N/A;   POSTERIOR FUSION LUMBAR SPINE  1979   ROTATOR CUFF REPAIR  2000's   left   V-TACH ABLATION N/A 06/06/2012   Procedure: V-TACH ABLATION;  Surgeon: Hillis Range, MD;  Location: [facility] CATH LAB;  Service: Cardiovascular;  Laterality: N/A;   VIDEO BRONCHOSCOPY WITH ENDOBRONCHIAL ULTRASOUND N/A 07/05/2023   Procedure: VIDEO BRONCHOSCOPY WITH ENDOBRONCHIAL ULTRASOUND;  Surgeon: Leslye Peer, MD;  Location: [facility] ENDOSCOPY;  Service: Pulmonary;  Laterality: N/A;    REVIEW OF SYSTEMS:  A comprehensive review of systems was negative except for: Constitutional: positive for fatigue Neurological: positive for dizziness   PHYSICAL EXAMINATION: General appearance: alert and cooperative Head: Normocephalic, without obvious abnormality, atraumatic Neck: no adenopathy, no JVD, supple, symmetrical, trachea midline, and thyroid not enlarged, symmetric, no tenderness/mass/nodules Lymph nodes: Cervical, supraclavicular, and axillary nodes normal. Resp: clear to auscultation bilaterally Back: symmetric, no curvature. ROM normal. No CVA tenderness. Cardio: regular rate and rhythm, S1, S2 normal, no murmur, click, rub or gallop GI: soft, non-tender; bowel sounds normal; no masses,  no organomegaly Extremities: extremities normal, atraumatic, no cyanosis or edema  ECOG PERFORMANCE STATUS: 1 - Symptomatic but completely ambulatory  Blood pressure 128/81, pulse 69, temperature (!)  97.5 F (36.4 C), temperature source Oral, resp. rate 18, weight 167 lb 6.4 oz (75.9 kg), SpO2 100%.  LABORATORY DATA: Lab Results  Component Value Date   WBC 2.6 (L) 09/20/2023   HGB 11.9 (L) 09/20/2023   HCT 35.6 (L) 09/20/2023   MCV 93.0 09/20/2023   PLT 99 (L) 09/20/2023      Chemistry      Component Value Date/Time   NA 139 09/13/2023 1345   NA 142 11/11/2022 1000   K 4.0 09/13/2023 1345   CL 107 09/13/2023 1345   CO2 26 09/13/2023 1345   BUN 16 09/13/2023 1345   BUN 18 11/11/2022 1000   CREATININE 1.44 (H) 09/13/2023 1345   CREATININE 1.37 (H) 01/24/2017 0950      Component Value Date/Time   CALCIUM 9.4 09/13/2023 1345   ALKPHOS 56 09/13/2023 1345   AST 13 (L) 09/13/2023 1345  ALT 10 09/13/2023 1345   BILITOT 0.8 09/13/2023 1345       RADIOGRAPHIC STUDIES: No results found.  ASSESSMENT AND PLAN: This is a very pleasant 82 years old African-American male with Stage IIIC/IV (T3, N3, M0) non-small cell lung cancer, adenocarcinoma. He presented with a left upper lobe spiculated mass in addition to lingular lesion and suspicious mediastinal lymphadenopathy. He started a course of concurrent chemoradiation initially with carboplatin for AUC of 2 and paclitaxel 45 Mg/M2 but paclitaxel was the changed to Abraxane starting from cycle #2 secondary to hypersensitivity reaction.  The patient received 2 cycles of concurrent chemoradiation.  His CBC today is acceptable for treatment. I recommended for the patient to proceed with his treatment with weekly carboplatin and Abraxane today.    Stage 3C Non-Small Cell Lung Cancer (Adenocarcinoma) Currently undergoing concurrent chemoradiation with Carboplatin and Abraxane. Treatment has been intermittently delayed due to low platelet counts. No new complaints related to treatment. -Continue with planned treatment today and next week if counts remain stable. -Order CT chest in one month to assess response to treatment. -Discuss  potential for immunotherapy after completion of current treatment course.  Intermittent Dizziness Occurs sporadically, potentially related to changes in position. No associated nausea or vomiting. -Monitor symptoms. Consider blood pressure monitoring if symptoms persist or worsen.  Follow-up Scheduled to see Dr. Tresa Endo on November 19th. Next oncology follow-up in approximately 5-6 weeks with a CT scan ordered a week prior.   He was advised to call immediately if he has any other concerning symptoms in the interval. The patient voices understanding of current disease status and treatment options and is in agreement with the current care plan.  All questions were answered. The patient knows to call the clinic with any problems, questions or concerns. We can certainly see the patient much sooner if necessary.  The total time spent in the appointment was 20 minutes.  Disclaimer: This note was dictated with voice recognition software. Similar sounding words can inadvertently be transcribed and may not be corrected upon review.

## 2023-09-20 NOTE — Patient Instructions (Signed)
[facility]  Discharge Instructions: Thank you for choosing [facility] to provide your oncology and hematology care.   If you have a lab appointment with the Cancer Center, please go directly to the Cancer Center and check in at the registration area.   Wear comfortable clothing and clothing appropriate for easy access to any Portacath or PICC line.   We strive to give you quality time with your provider. You may need to reschedule your appointment if you arrive late (15 or more minutes).  Arriving late affects you and other patients whose appointments are after yours.  Also, if you miss three or more appointments without notifying the office, you may be dismissed from the clinic at the provider's discretion.      For prescription refill requests, have your pharmacy contact our office and allow 72 hours for refills to be completed.    Today you received the following chemotherapy and/or immunotherapy agents: Abraxane/Carboplatin      To help prevent nausea and vomiting after your treatment, we encourage you to take your nausea medication as directed.  BELOW ARE SYMPTOMS THAT SHOULD BE REPORTED IMMEDIATELY: *FEVER GREATER THAN 100.4 F (38 C) OR HIGHER *CHILLS OR SWEATING *NAUSEA AND VOMITING THAT IS NOT CONTROLLED WITH YOUR NAUSEA MEDICATION *UNUSUAL SHORTNESS OF BREATH *UNUSUAL BRUISING OR BLEEDING *URINARY PROBLEMS (pain or burning when urinating, or frequent urination) *BOWEL PROBLEMS (unusual diarrhea, constipation, pain near the anus) TENDERNESS IN MOUTH AND THROAT WITH OR WITHOUT PRESENCE OF ULCERS (sore throat, sores in mouth, or a toothache) UNUSUAL RASH, SWELLING OR PAIN  UNUSUAL VAGINAL DISCHARGE OR ITCHING   Items with * indicate a potential emergency and should be followed up as soon as possible or go to the Emergency Department if any problems should occur.  Please show the CHEMOTHERAPY ALERT CARD or IMMUNOTHERAPY ALERT  CARD at check-in to the Emergency Department and triage nurse.  Should you have questions after your visit or need to cancel or reschedule your appointment, please contact [facility]  Dept: [phone]  and follow the prompts.  Office hours are 8:00 a.m. to 4:30 p.m. Monday - Friday. Please note that voicemails left after 4:00 p.m. may not be returned until the following business day.  We are closed weekends and major holidays. You have access to a nurse at all times for urgent questions. Please call the main number to the clinic Dept: [phone] and follow the prompts.   For any non-urgent questions, you may also contact your provider using MyChart. We now offer e-Visits for anyone 18 and older to request care online for non-urgent symptoms. For details visit [email or web address].   Also download the MyChart app! Go to the app store, search "MyChart", open the app, select [facility], and log in with your MyChart username and password.   

## 2023-09-20 NOTE — Progress Notes (Signed)
Leave carboplatin dose 120 mg today per  Dr. Arbutus Ped.

## 2023-09-21 ENCOUNTER — Encounter: Payer: Self-pay | Admitting: Internal Medicine

## 2023-09-21 ENCOUNTER — Other Ambulatory Visit: Payer: Self-pay

## 2023-09-21 ENCOUNTER — Ambulatory Visit
Admission: RE | Admit: 2023-09-21 | Discharge: 2023-09-21 | Disposition: A | Discharge status: Home / Self Care | Payer: 59 | Source: Ambulatory Visit | Attending: Radiation Oncology | Admitting: Radiation Oncology

## 2023-09-21 DIAGNOSIS — C3412 Malignant neoplasm of upper lobe, left bronchus or lung: Secondary | ICD-10-CM | POA: Diagnosis not present

## 2023-09-21 DIAGNOSIS — Z510 Encounter for antineoplastic radiation therapy: Secondary | ICD-10-CM | POA: Diagnosis not present

## 2023-09-21 DIAGNOSIS — Z87891 Personal history of nicotine dependence: Secondary | ICD-10-CM | POA: Diagnosis not present

## 2023-09-21 LAB — RAD ONC ARIA SESSION SUMMARY
Course Elapsed Days: 35
Plan Fractions Treated to Date: 24
Plan Prescribed Dose Per Fraction: 2 Gy
Plan Total Fractions Prescribed: 30
Plan Total Prescribed Dose: 60 Gy
Reference Point Dosage Given to Date: 48 Gy
Reference Point Session Dosage Given: 2 Gy
Session Number: 24

## 2023-09-22 ENCOUNTER — Other Ambulatory Visit: Payer: Self-pay

## 2023-09-22 ENCOUNTER — Ambulatory Visit
Admission: RE | Admit: 2023-09-22 | Discharge: 2023-09-22 | Disposition: A | Discharge status: Home / Self Care | Payer: 59 | Source: Ambulatory Visit | Attending: Radiation Oncology | Admitting: Radiation Oncology

## 2023-09-22 DIAGNOSIS — Z87891 Personal history of nicotine dependence: Secondary | ICD-10-CM | POA: Diagnosis not present

## 2023-09-22 DIAGNOSIS — Z510 Encounter for antineoplastic radiation therapy: Secondary | ICD-10-CM | POA: Diagnosis not present

## 2023-09-22 DIAGNOSIS — C3412 Malignant neoplasm of upper lobe, left bronchus or lung: Secondary | ICD-10-CM | POA: Diagnosis not present

## 2023-09-22 LAB — RAD ONC ARIA SESSION SUMMARY
Course Elapsed Days: 36
Plan Fractions Treated to Date: 25
Plan Prescribed Dose Per Fraction: 2 Gy
Plan Total Fractions Prescribed: 30
Plan Total Prescribed Dose: 60 Gy
Reference Point Dosage Given to Date: 50 Gy
Reference Point Session Dosage Given: 2 Gy
Session Number: 25

## 2023-09-23 ENCOUNTER — Ambulatory Visit
Admission: RE | Admit: 2023-09-23 | Discharge: 2023-09-23 | Disposition: A | Discharge status: Home / Self Care | Payer: 59 | Source: Ambulatory Visit | Attending: Radiation Oncology | Admitting: Radiation Oncology

## 2023-09-23 ENCOUNTER — Ambulatory Visit (INDEPENDENT_AMBULATORY_CARE_PROVIDER_SITE_OTHER): Payer: 59 | Admitting: Podiatry

## 2023-09-23 ENCOUNTER — Other Ambulatory Visit: Payer: Self-pay

## 2023-09-23 ENCOUNTER — Encounter: Payer: Self-pay | Admitting: Podiatry

## 2023-09-23 DIAGNOSIS — M79674 Pain in right toe(s): Secondary | ICD-10-CM | POA: Diagnosis not present

## 2023-09-23 DIAGNOSIS — B351 Tinea unguium: Secondary | ICD-10-CM | POA: Diagnosis not present

## 2023-09-23 DIAGNOSIS — Z510 Encounter for antineoplastic radiation therapy: Secondary | ICD-10-CM | POA: Diagnosis not present

## 2023-09-23 DIAGNOSIS — D689 Coagulation defect, unspecified: Secondary | ICD-10-CM

## 2023-09-23 DIAGNOSIS — C3412 Malignant neoplasm of upper lobe, left bronchus or lung: Secondary | ICD-10-CM | POA: Insufficient documentation

## 2023-09-23 DIAGNOSIS — M79675 Pain in left toe(s): Secondary | ICD-10-CM

## 2023-09-23 DIAGNOSIS — Z87891 Personal history of nicotine dependence: Secondary | ICD-10-CM | POA: Diagnosis not present

## 2023-09-23 LAB — RAD ONC ARIA SESSION SUMMARY
Course Elapsed Days: 37
Plan Fractions Treated to Date: 26
Plan Prescribed Dose Per Fraction: 2 Gy
Plan Total Fractions Prescribed: 30
Plan Total Prescribed Dose: 60 Gy
Reference Point Dosage Given to Date: 52 Gy
Reference Point Session Dosage Given: 2 Gy
Session Number: 26

## 2023-09-23 NOTE — Progress Notes (Signed)
This patient returns to my office for at risk foot care.  This patient requires this care by a professional since this patient will be at risk due to having , PAD and  Chronic kidney disease.  .  This patient is unable to cut nails himself since the patient cannot reach his nails.These nails are painful walking and wearing shoes.  This patient presents for at risk foot care today.  General Appearance  Alert, conversant and in no acute stress.  Vascular  Dorsalis pedis and posterior tibial  pulses are palpable  bilaterally.  Capillary return is within normal limits  bilaterally. Temperature is within normal limits  bilaterally.  Neurologic  Senn-Weinstein monofilament wire test within normal limits  bilaterally. Muscle power within normal limits bilaterally.  Nails Thick disfigured discolored nails with subungual debris  from hallux to fifth toes bilaterally. No evidence of bacterial infection or drainage bilaterally.  Orthopedic  No limitations of motion  feet .  No crepitus or effusions noted.  No bony pathology or digital deformities noted.  HAV  B/L.  Skin  normotropic skin with no porokeratosis noted bilaterally.  No signs of infections or ulcers noted.     Onychomycosis  Pain in right toes  Pain in left toes  Consent was obtained for treatment procedures.   Mechanical debridement of nails 1-5  bilaterally performed with a nail nipper.  Filed with dremel without incident.    Return office visit   3 months                   Told patient to return for periodic foot care and evaluation due to potential at risk complications.   Brooklyn Jeff DPM  

## 2023-09-26 ENCOUNTER — Inpatient Hospital Stay: Payer: 59

## 2023-09-26 ENCOUNTER — Other Ambulatory Visit: Payer: Self-pay | Admitting: Internal Medicine

## 2023-09-26 ENCOUNTER — Other Ambulatory Visit: Payer: Self-pay

## 2023-09-26 ENCOUNTER — Telehealth: Payer: Self-pay

## 2023-09-26 ENCOUNTER — Ambulatory Visit
Admission: RE | Admit: 2023-09-26 | Discharge: 2023-09-26 | Disposition: A | Discharge status: Home / Self Care | Payer: 59 | Source: Ambulatory Visit | Attending: Radiation Oncology | Admitting: Radiation Oncology

## 2023-09-26 ENCOUNTER — Inpatient Hospital Stay (HOSPITAL_BASED_OUTPATIENT_CLINIC_OR_DEPARTMENT_OTHER): Payer: 59 | Admitting: Physician Assistant

## 2023-09-26 VITALS — BP 138/83 | HR 70 | Temp 98.0°F | Resp 16 | Wt 165.0 lb

## 2023-09-26 VITALS — BP 138/83 | HR 70 | Temp 98.0°F | Resp 16

## 2023-09-26 DIAGNOSIS — Z825 Family history of asthma and other chronic lower respiratory diseases: Secondary | ICD-10-CM | POA: Insufficient documentation

## 2023-09-26 DIAGNOSIS — C3412 Malignant neoplasm of upper lobe, left bronchus or lung: Secondary | ICD-10-CM | POA: Insufficient documentation

## 2023-09-26 DIAGNOSIS — I252 Old myocardial infarction: Secondary | ICD-10-CM | POA: Insufficient documentation

## 2023-09-26 DIAGNOSIS — Z7901 Long term (current) use of anticoagulants: Secondary | ICD-10-CM | POA: Insufficient documentation

## 2023-09-26 DIAGNOSIS — Z860102 Personal history of hyperplastic colon polyps: Secondary | ICD-10-CM | POA: Insufficient documentation

## 2023-09-26 DIAGNOSIS — Z79630 Long term (current) use of alkylating agent: Secondary | ICD-10-CM | POA: Insufficient documentation

## 2023-09-26 DIAGNOSIS — Z5111 Encounter for antineoplastic chemotherapy: Secondary | ICD-10-CM | POA: Insufficient documentation

## 2023-09-26 DIAGNOSIS — Z8249 Family history of ischemic heart disease and other diseases of the circulatory system: Secondary | ICD-10-CM | POA: Insufficient documentation

## 2023-09-26 DIAGNOSIS — Z79899 Other long term (current) drug therapy: Secondary | ICD-10-CM | POA: Insufficient documentation

## 2023-09-26 DIAGNOSIS — I130 Hypertensive heart and chronic kidney disease with heart failure and stage 1 through stage 4 chronic kidney disease, or unspecified chronic kidney disease: Secondary | ICD-10-CM | POA: Insufficient documentation

## 2023-09-26 DIAGNOSIS — Z791 Long term (current) use of non-steroidal anti-inflammatories (NSAID): Secondary | ICD-10-CM | POA: Insufficient documentation

## 2023-09-26 DIAGNOSIS — J029 Acute pharyngitis, unspecified: Secondary | ICD-10-CM

## 2023-09-26 DIAGNOSIS — Z87442 Personal history of urinary calculi: Secondary | ICD-10-CM | POA: Insufficient documentation

## 2023-09-26 DIAGNOSIS — N1832 Chronic kidney disease, stage 3b: Secondary | ICD-10-CM | POA: Insufficient documentation

## 2023-09-26 DIAGNOSIS — R059 Cough, unspecified: Secondary | ICD-10-CM | POA: Insufficient documentation

## 2023-09-26 DIAGNOSIS — Z510 Encounter for antineoplastic radiation therapy: Secondary | ICD-10-CM | POA: Diagnosis not present

## 2023-09-26 DIAGNOSIS — Z87891 Personal history of nicotine dependence: Secondary | ICD-10-CM | POA: Insufficient documentation

## 2023-09-26 DIAGNOSIS — I5022 Chronic systolic (congestive) heart failure: Secondary | ICD-10-CM | POA: Insufficient documentation

## 2023-09-26 LAB — CMP (CANCER CENTER ONLY)
ALT: 9 U/L (ref 0–44)
AST: 13 U/L — ABNORMAL LOW (ref 15–41)
Albumin: 3.7 g/dL (ref 3.5–5.0)
Alkaline Phosphatase: 50 U/L (ref 38–126)
Anion gap: 4 — ABNORMAL LOW (ref 5–15)
BUN: 19 mg/dL (ref 8–23)
CO2: 26 mmol/L (ref 22–32)
Calcium: 9.6 mg/dL (ref 8.9–10.3)
Chloride: 108 mmol/L (ref 98–111)
Creatinine: 1.45 mg/dL — ABNORMAL HIGH (ref 0.61–1.24)
GFR, Estimated: 48 mL/min — ABNORMAL LOW (ref >60)
Glucose, Bld: 121 mg/dL — ABNORMAL HIGH (ref 70–99)
Potassium: 4.6 mmol/L (ref 3.5–5.1)
Sodium: 138 mmol/L (ref 135–145)
Total Bilirubin: 1.1 mg/dL (ref <1.2)
Total Protein: 7.3 g/dL (ref 6.5–8.1)

## 2023-09-26 LAB — RAD ONC ARIA SESSION SUMMARY
Course Elapsed Days: 40
Plan Fractions Treated to Date: 27
Plan Prescribed Dose Per Fraction: 2 Gy
Plan Total Fractions Prescribed: 30
Plan Total Prescribed Dose: 60 Gy
Reference Point Dosage Given to Date: 54 Gy
Reference Point Session Dosage Given: 2 Gy
Session Number: 27

## 2023-09-26 LAB — CBC WITH DIFFERENTIAL (CANCER CENTER ONLY)
Abs Immature Granulocytes: 0.01 10*3/uL (ref 0.00–0.07)
Basophils Absolute: 0 10*3/uL (ref 0.0–0.1)
Basophils Relative: 1 %
Eosinophils Absolute: 0.1 10*3/uL (ref 0.0–0.5)
Eosinophils Relative: 3 %
HCT: 35.4 % — ABNORMAL LOW (ref 39.0–52.0)
Hemoglobin: 12.1 g/dL — ABNORMAL LOW (ref 13.0–17.0)
Immature Granulocytes: 1 %
Lymphocytes Relative: 13 %
Lymphs Abs: 0.2 10*3/uL — ABNORMAL LOW (ref 0.7–4.0)
MCH: 31.8 pg (ref 26.0–34.0)
MCHC: 34.2 g/dL (ref 30.0–36.0)
MCV: 92.9 fL (ref 80.0–100.0)
Monocytes Absolute: 0.4 10*3/uL (ref 0.1–1.0)
Monocytes Relative: 20 %
Neutro Abs: 1.1 10*3/uL — ABNORMAL LOW (ref 1.7–7.7)
Neutrophils Relative %: 62 %
Platelet Count: 144 10*3/uL — ABNORMAL LOW (ref 150–400)
RBC: 3.81 MIL/uL — ABNORMAL LOW (ref 4.22–5.81)
RDW: 13.8 % (ref 11.5–15.5)
WBC Count: 1.8 10*3/uL — ABNORMAL LOW (ref 4.0–10.5)
nRBC: 0 % (ref 0.0–0.2)

## 2023-09-26 MED ORDER — SUCRALFATE 1 G PO TABS
ORAL_TABLET | ORAL | 0 refills | Status: DC
Start: 2023-09-26 — End: 2023-09-26

## 2023-09-26 MED ORDER — PALONOSETRON HCL INJECTION 0.25 MG/5ML
0.2500 mg | Freq: Once | INTRAVENOUS | Status: AC
  Administered 2023-09-26: 0.25 mg via INTRAVENOUS
  Filled 2023-09-26: qty 5

## 2023-09-26 MED ORDER — DEXAMETHASONE SODIUM PHOSPHATE 10 MG/ML IJ SOLN
10.0000 mg | Freq: Once | INTRAMUSCULAR | Status: AC
  Administered 2023-09-26: 10 mg via INTRAVENOUS
  Filled 2023-09-26: qty 1

## 2023-09-26 MED ORDER — CARBOPLATIN CHEMO INJECTION 450 MG/45ML
119.8000 mg | Freq: Once | INTRAVENOUS | Status: AC
  Administered 2023-09-26: 120 mg via INTRAVENOUS
  Filled 2023-09-26: qty 12

## 2023-09-26 MED ORDER — SUCRALFATE 1 G PO TABS
ORAL_TABLET | ORAL | 0 refills | Status: DC
Start: 2023-09-26 — End: 2024-02-16

## 2023-09-26 MED ORDER — SODIUM CHLORIDE 0.9 % IV SOLN
Freq: Once | INTRAVENOUS | Status: AC
Start: 2023-09-26 — End: 2023-09-26

## 2023-09-26 MED ORDER — PACLITAXEL PROTEIN-BOUND CHEMO INJECTION 100 MG
40.0000 mg/m2 | Freq: Once | INTRAVENOUS | Status: AC
Start: 2023-09-26 — End: 2023-09-26
  Administered 2023-09-26: 75 mg via INTRAVENOUS
  Filled 2023-09-26: qty 15

## 2023-09-26 NOTE — Progress Notes (Unsigned)
Symptom Management Consult Note [facility]    Patient Care Team: [name], MD as PCP - General (Family Medicine) Lennette Bihari, MD as PCP - Cardiology (Cardiology) Thurmon Fair, MD as PCP - Electrophysiology (Cardiology) Duke, Roe Rutherford, PA as Physician Assistant (Cardiology)    Name / MRN / DOB: [name]  [id number]  November 25, 1940   Date of visit: 09/26/2023   Chief Complaint/Reason for visit: sore throat   Current Therapy: carboplatin,. Paclitaxel-protein bound,  Last treatment:  Day 1   Cycle 2 on 09/20/23   ASSESSMENT & PLAN: Patient is a 82 y.o. male with oncologic history of Stage IIIC/IV (T3, N3, M0) non-small cell lung cancer, adenocarcinoma. followed by Dr. Arbutus Ped.  I have viewed most recent oncology note and lab work.    #Stage IIIC/IV (T3, N3, M0) non-small cell lung cancer, adenocarcinoma.  - Patient has final radiation treatment 09/29/23. - Here for chemotherapy today. - Next appointment with oncologist is 10/27/23  #Sore throat -Exam without mucositis. 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  -Prescription sent to pharmacy for Carafate. Radiation oncology notified of patient's symptoms as well.   Strict ED precautions discussed should symptoms worsen.   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 male with oncologic history as above presenting to [facility] today with chief complaint of sore throat.  He is accompanied by spouse who provides additional history.  Patient states symptoms started last night.   He noticed it right before dinner.  He describes it as a scratchy and painful sensation. He was still able to eat dinner although did experience worsening pain when swallowing.  He is able to handle his secretions.  He denies any fevers or chills.  He tried a cough drop for his symptoms without much improvement.  Patient reports he forgot to mention sore throat to the radiation oncologist during his treatment earlier today.    ROS  All other systems are reviewed and are negative for acute change except as noted in the HPI.    Allergies  Allergen Reactions   Paclitaxel Other (See Comments) and Hypertension    Pt reported chest pain, back pain, and SOB during first infusion (see notes from 9/23). Infusion discontinued.    Allegra [Fexofenadine] Other (See Comments)    Makes him dizzy and feels like he will fall.   Sonafine [Wound Dressings]     Itching and burning to skin  08/25/23     Past Medical History:  Diagnosis Date   AICD (automatic cardioverter/defibrillator) present    CAD (coronary artery disease) 80% stenosis diag of the LAD, 30% in OM2 branch of LCX in 2009    a. Nonobstructive CAD by cath 11/2011 with the exception of the pre-existing diagonal branch #2 lesion.   Chronic systolic CHF (congestive heart failure) (HCC)    CKD (chronic kidney disease) stage 3, GFR 30-59 ml/min (HCC)    Colon polyp, hyperplastic    History of kidney stones    History  of stress test 06/01/2012   Normal myocardial perfusion study. compared to the previous study there is no significant change. this is a low risk scan   Hypertension    Legally blind    "both eyes"   Myocardial infarction [facility]) 11/22/2011   NICM (nonischemic cardiomyopathy) (HCC)    a. Remote hx of dilated NICM with EF ranging 20-45%, including normal EF by echo (55-60%) in 2014.   Peripheral arterial disease (HCC)    a. 06/2014: ABI right 0.99, left 1.2, LE dopplers revealing an occluded right posterior tibial. As symptoms  were not felt r/t claudication, no further w/u at the time.   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 Requiring discontinuation of BB dose.   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     Past Surgical History:  Procedure Laterality Date   BACK SURGERY     BIV UPGRADE N/A 11/08/2019   Procedure: BIV UPGRADE;  Surgeon: Marinus Maw, MD;  Location: [facility] INVASIVE CV LAB;  Service: Cardiovascular;  Laterality: N/A;   BRONCHIAL BIOPSY  07/05/2023   Procedure: BRONCHIAL BIOPSIES;  Surgeon: Leslye Peer, MD;  Location: [facility] ENDOSCOPY;  Service: Pulmonary;;   BRONCHIAL BRUSHINGS  07/05/2023   Procedure: BRONCHIAL BRUSHINGS;  Surgeon: Leslye Peer, MD;  Location: [facility] ENDOSCOPY;  Service: Pulmonary;;   BRONCHIAL NEEDLE ASPIRATION BIOPSY  07/05/2023   Procedure: BRONCHIAL NEEDLE ASPIRATION BIOPSIES;  Surgeon: Leslye Peer, MD;  Location: [facility] ENDOSCOPY;  Service: Pulmonary;;   BRONCHIAL WASHINGS  07/05/2023   Procedure: BRONCHIAL WASHINGS;  Surgeon: Leslye Peer, MD;  Location: [facility] ENDOSCOPY;  Service: Pulmonary;;   CARDIAC CATHETERIZATION  11/2011   CARDIAC CATHETERIZATION  11/2011   didn't demonstrate high grade obstructive disease to account for his LV dysfunction.   CATARACT EXTRACTION, BILATERAL  1990's   CYSTOSCOPY     CYSTOSCOPY WITH BIOPSY Bilateral 08/03/2021   Procedure: CYSTOSCOPY WITH BLADDER BIOPSY, BILATERAL RETROGRADE PYELOGRAM;  Surgeon: Crista Elliot, MD;  Location: [facility] ORS;  Service: Urology;  Laterality: Bilateral;  REQUESTING 45 MINS   CYSTOSCOPY WITH URETHRAL DILATATION N/A 05/04/2013   Procedure: CYSTOSCOPY WITH URETHRAL DILATATION;  Surgeon: Tia Alert, MD;  Location: [facility] NEURO ORS;  Service: Neurosurgery;  Laterality: N/A;  with insertion of foley catheter   EP study and ablation of VT  7/13   PVC focus mapped to the right coronary cusp of  the aorta, limited ablation performed due to proximity of the focus to the right coronary artery   EYE SURGERY     FINE NEEDLE ASPIRATION  07/05/2023   Procedure: FINE NEEDLE ASPIRATION (FNA) LINEAR;  Surgeon: Leslye Peer, MD;  Location: [facility] ENDOSCOPY;  Service: Pulmonary;;   LEFT HEART CATH AND CORONARY ANGIOGRAPHY N/A 11/07/2019   Procedure: LEFT HEART CATH AND CORONARY ANGIOGRAPHY;  Surgeon: Iran Ouch, MD;  Location: [facility] INVASIVE CV LAB;  Service: Cardiovascular;  Laterality: N/A;   LEFT HEART CATHETERIZATION WITH CORONARY ANGIOGRAM N/A 11/25/2011   Procedure: LEFT HEART CATHETERIZATION WITH CORONARY ANGIOGRAM;  Surgeon: Marykay Lex, MD;  Location: [facility] CATH LAB;  Service: Cardiovascular;  Laterality: N/A;   PACEMAKER IMPLANT N/A 07/12/2018   Procedure: PACEMAKER IMPLANT;  Surgeon: Thurmon Fair, MD;  Location: [facility] INVASIVE CV LAB;  Service: Cardiovascular;  Laterality: N/A;   POSTERIOR FUSION LUMBAR SPINE  1979   ROTATOR CUFF REPAIR  2000's   left   V-TACH ABLATION N/A 06/06/2012   Procedure: V-TACH ABLATION;  Surgeon: Hillis Range, MD;  Location: [facility] CATH LAB;  Service: Cardiovascular;  Laterality: N/A;   VIDEO BRONCHOSCOPY WITH ENDOBRONCHIAL ULTRASOUND N/A 07/05/2023   Procedure: VIDEO BRONCHOSCOPY WITH ENDOBRONCHIAL ULTRASOUND;  Surgeon: Leslye Peer, MD;  Location: [facility] ENDOSCOPY;  Service: Pulmonary;  Laterality: N/A;    Social History   Socioeconomic History   Marital status: Legally Separated    Spouse name: Not on file   Number of children: 3   Years of education: Not on file   Highest education level: Not on file  Occupational History   Occupation: Retired    Associate Professor: RETIRED  Tobacco Use   Smoking status: Former    Current packs/day: 0.00    Types: Cigarettes    Start date: 11/22/1949    Quit date: 11/23/1999    Years since quitting: 23.8    Passive exposure: Current   Smokeless tobacco: Never  Vaping Use   Vaping status: Never Used  Substance and Sexual  Activity   Alcohol use: No    Comment: 05/26/12 "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; stopped drinking in the early 1980's"   Drug use: No   Sexual activity: Yes  Other Topics Concern   Not on file  Social History Narrative   Not on file   Social Determinants of Health   Financial Resource Strain: Not on file  Food Insecurity: No Food Insecurity (08/04/2023)   Hunger Vital Sign    Worried About Running Out of Food in the Last Year: Never true    Ran Out of Food in the Last Year: Never true  Transportation Needs: No Transportation Needs (08/04/2023)   PRAPARE - Administrator, Civil Service (Medical): No    Lack of Transportation (Non-Medical): No  Physical Activity: Not on file  Stress: Not on file  Social Connections: Not on file  Intimate Partner Violence: Not At Risk (08/04/2023)   Humiliation, Afraid, Rape, and Kick questionnaire    Fear of Current or Ex-Partner: No    Emotionally Abused: No    Physically Abused: No    Sexually Abused: No    Family History  Problem Relation Age of Onset   Asthma Mother    Other Other        Unsure if any heart disease in his family.     Current Outpatient Medications:    sucralfate (CARAFATE) 1 g tablet, Crush and dissolve in 10 ml of warm water prior to swallowing. Can be taken 3 times daily with meals and at bedtime., Disp: 120 tablet, Rfl: 0   albuterol (VENTOLIN HFA) 108 (90 Base) MCG/ACT inhaler, Inhale 1-2 puffs into the lungs every 6 (six) hours as needed for wheezing or shortness of breath., Disp: , Rfl:    apixaban (ELIQUIS) 2.5 MG TABS tablet, Take 1 tablet (2.5 mg total) by mouth 2 (two) times daily. Okay to restart this medication on 07/06/2023, Disp: , Rfl:    BREO ELLIPTA 100-25 MCG/INH AEPB, Inhale 1 puff into the lungs every morning. (Patient not taking: Reported on 08/04/2023), Disp: , Rfl:    brimonidine (ALPHAGAN) 0.2 % ophthalmic solution, Place 1 drop into both eyes 2 (two) times daily., Disp: , Rfl:    cycloSPORINE  (RESTASIS) 0.05 % ophthalmic emulsion, Place 1 drop into both eyes 2 (two) times daily., Disp: , Rfl:    diclofenac Sodium (VOLTAREN) 1 % GEL, Apply 2 g topically 4 (  four) times daily. (Patient not taking: Reported on 08/04/2023), Disp: 100 g, Rfl: 0   dorzolamide-timolol (COSOPT) 22.3-6.8 MG/ML ophthalmic solution, Place 1 drop into both eyes 2 (two) times daily., Disp: , Rfl:    furosemide (LASIX) 20 MG tablet, Take 1 tablet (20 mg total) by mouth See admin instructions. Take 20 mg by mouth EVERY OTHER MORNING, Disp: , Rfl:    isosorbide mononitrate (IMDUR) 30 MG 24 hr tablet, Take 30 mg by mouth daily. (Patient not taking: Reported on 08/04/2023), Disp: , Rfl:    metoprolol succinate (TOPROL-XL) 100 MG 24 hr tablet, Take 1 tablet (100 mg total) by mouth in the morning. Take with or immediately following a meal., Disp: , Rfl:    nitroGLYCERIN (NITROSTAT) 0.4 MG SL tablet, Place 1 tablet (0.4 mg total) under the tongue every 5 (five) minutes as needed for chest pain., Disp: 25 tablet, Rfl: 1   prochlorperazine (COMPAZINE) 10 MG tablet, Take 1 tablet (10 mg total) by mouth every 6 (six) hours as needed. (Patient not taking: Reported on 08/04/2023), Disp: 30 tablet, Rfl: 2   ROCKLATAN 0.02-0.005 % SOLN, Apply 1 drop to eye at bedtime., Disp: , Rfl:    sacubitril-valsartan (ENTRESTO) 24-26 MG, Take 1 tablet by mouth 2 (two) times daily., Disp: 60 tablet, Rfl: 11   spironolactone (ALDACTONE) 25 MG tablet, Take 0.5 tablets (12.5 mg total) by mouth daily., Disp: 90 tablet, Rfl: 2  PHYSICAL EXAM: ECOG FS:1 - Symptomatic but completely ambulatory    Vitals:   09/26/23 1039  BP: 138/83  Pulse: 70  Resp: 16  Temp: 98 F (36.7 C)  TempSrc: Oral  SpO2: 94%   Physical Exam Vitals and nursing note reviewed.  Constitutional:      Appearance: He is not ill-appearing or toxic-appearing.  HENT:     Head: Normocephalic.     Mouth/Throat:     Mouth: Mucous membranes are moist. No oral lesions.      Dentition: No gum lesions.     Palate: No lesions.     Pharynx: Oropharynx is clear. No oropharyngeal exudate or posterior oropharyngeal erythema.     Tonsils: No tonsillar exudate.  Eyes:     Conjunctiva/sclera: Conjunctivae normal.  Cardiovascular:     Rate and Rhythm: Normal rate.  Pulmonary:     Effort: Pulmonary effort is normal.  Abdominal:     General: There is no distension.  Musculoskeletal:     Cervical back: Normal range of motion.  Skin:    General: Skin is warm and dry.  Neurological:     Mental Status: He is alert.        LABORATORY DATA: I have reviewed the data as listed    Latest Ref Rng & Units 09/26/2023    9:55 AM 09/20/2023    8:42 AM 09/13/2023    1:45 PM  CBC  WBC 4.0 - 10.5 K/uL 1.8  2.6  2.4   Hemoglobin 13.0 - 17.0 g/dL 16.1  09.6  04.5   Hematocrit 39.0 - 52.0 % 35.4  35.6  35.0   Platelets 150 - 400 K/uL 144  99  76         Latest Ref Rng & Units 09/26/2023    9:55 AM 09/20/2023    8:42 AM 09/13/2023    1:45 PM  CMP  Glucose 70 - 99 mg/dL 409  811  97   BUN 8 - 23 mg/dL 19  17  16    Creatinine 0.61 - 1.24  mg/dL 1.61  0.96  0.45   Sodium 135 - 145 mmol/L 138  139  139   Potassium 3.5 - 5.1 mmol/L 4.6  4.4  4.0   Chloride 98 - 111 mmol/L 108  108  107   CO2 22 - 32 mmol/L 26  26  26    Calcium 8.9 - 10.3 mg/dL 9.6  9.6  9.4   Total Protein 6.5 - 8.1 g/dL 7.3  7.4  7.2   Total Bilirubin <1.2 mg/dL 1.1  0.7  0.8   Alkaline Phos 38 - 126 U/L 50  50  56   AST 15 - 41 U/L 13  15  13    ALT 0 - 44 U/L 9  9  10         RADIOGRAPHIC STUDIES (from last 24 hours if applicable) I have personally reviewed the radiological images as listed and agreed with the findings in the report. No results found.      Visit Diagnosis: 1. Sore throat   2. 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   All questions were answered. The patient knows to call the clinic  with any problems, questions or concerns. No barriers to learning was detected.  A total of more than 30 minutes were spent on this encounter with face-to-face time and non-face-to-face time, including preparing to see the patient, ordering medications, counseling the patient and coordination of care as outlined above.    Thank you for allowing me to participate in the care of this patient.    Shanon Ace, PA-C Department of Hematology/Oncology [facility] at [facility] Phone: [phone]  [phone]    09/26/2023 11:20 AM

## 2023-09-26 NOTE — Telephone Encounter (Signed)
Called [employer] to have patients ICD checked by the vendor on patients final treatment on 09/29/23 per Dr. Rachelle Hora Croitoru. Spoke with Arlys John whom is the local device rep and I was informed that patient has app on his phone that he can send over results from cell phone. Per Brain no in person device testing needed.

## 2023-09-26 NOTE — Patient Instructions (Signed)
[place] [facility]   Discharge Instructions: Thank you for choosing [facility] to provide your oncology and hematology care.   If you have a lab appointment with the Cancer Center, please go directly to the Cancer Center and check in at the registration area.   Wear comfortable clothing and clothing appropriate for easy access to any Portacath or PICC line.   We strive to give you quality time with your provider. You may need to reschedule your appointment if you arrive late (15 or more minutes).  Arriving late affects you and other patients whose appointments are after yours.  Also, if you miss three or more appointments without notifying the office, you may be dismissed from the clinic at the provider's discretion.      For prescription refill requests, have your pharmacy contact our office and allow 72 hours for refills to be completed.    Today you received the following chemotherapy and/or immunotherapy agents: Paclitaxel protein-bound (Abraxane) and Carboplatin       To help prevent nausea and vomiting after your treatment, we encourage you to take your nausea medication as directed.  BELOW ARE SYMPTOMS THAT SHOULD BE REPORTED IMMEDIATELY: *FEVER GREATER THAN 100.4 F (38 C) OR HIGHER *CHILLS OR SWEATING *NAUSEA AND VOMITING THAT IS NOT CONTROLLED WITH YOUR NAUSEA MEDICATION *UNUSUAL SHORTNESS OF BREATH *UNUSUAL BRUISING OR BLEEDING *URINARY PROBLEMS (pain or burning when urinating, or frequent urination) *BOWEL PROBLEMS (unusual diarrhea, constipation, pain near the anus) TENDERNESS IN MOUTH AND THROAT WITH OR WITHOUT PRESENCE OF ULCERS (sore throat, sores in mouth, or a toothache) UNUSUAL RASH, SWELLING OR PAIN  UNUSUAL VAGINAL DISCHARGE OR ITCHING   Items with * indicate a potential emergency and should be followed up as soon as possible or go to the Emergency Department if any problems should occur.  Please show the CHEMOTHERAPY  ALERT CARD or IMMUNOTHERAPY ALERT CARD at check-in to the Emergency Department and triage nurse.  Should you have questions after your visit or need to cancel or reschedule your appointment, please contact [place] [facility]  Dept: [phone]  and follow the prompts.  Office hours are 8:00 a.m. to 4:30 p.m. Monday - Friday. Please note that voicemails left after 4:00 p.m. may not be returned until the following business day.  We are closed weekends and major holidays. You have access to a nurse at all times for urgent questions. Please call the main number to the clinic Dept: [phone] and follow the prompts.   For any non-urgent questions, you may also contact your provider using MyChart. We now offer e-Visits for anyone 69 and older to request care online for non-urgent symptoms. For details visit [email or web address].   Also download the MyChart app! Go to the app store, search "MyChart", open the app, select [place], and log in with your MyChart username and password.

## 2023-09-26 NOTE — Patient Instructions (Signed)
Prescription for Carafate sent to your pharmacy.  This medication is used to help your sore throat. Take as directed on the bottle.

## 2023-09-26 NOTE — Progress Notes (Signed)
Per Dr Arbutus Ped ok to proceed with ANC of 1.1 today.

## 2023-09-27 ENCOUNTER — Encounter: Payer: Self-pay | Admitting: Internal Medicine

## 2023-09-27 ENCOUNTER — Ambulatory Visit
Admission: RE | Admit: 2023-09-27 | Discharge: 2023-09-27 | Disposition: A | Discharge status: Home / Self Care | Payer: 59 | Source: Ambulatory Visit | Attending: Radiation Oncology | Admitting: Radiation Oncology

## 2023-09-27 ENCOUNTER — Ambulatory Visit: Payer: 59

## 2023-09-27 ENCOUNTER — Other Ambulatory Visit: Payer: Self-pay

## 2023-09-27 DIAGNOSIS — Z510 Encounter for antineoplastic radiation therapy: Secondary | ICD-10-CM | POA: Diagnosis not present

## 2023-09-27 DIAGNOSIS — Z87891 Personal history of nicotine dependence: Secondary | ICD-10-CM | POA: Diagnosis not present

## 2023-09-27 DIAGNOSIS — C3412 Malignant neoplasm of upper lobe, left bronchus or lung: Secondary | ICD-10-CM | POA: Diagnosis not present

## 2023-09-27 LAB — RAD ONC ARIA SESSION SUMMARY
Course Elapsed Days: 41
Plan Fractions Treated to Date: 28
Plan Prescribed Dose Per Fraction: 2 Gy
Plan Total Fractions Prescribed: 30
Plan Total Prescribed Dose: 60 Gy
Reference Point Dosage Given to Date: 56 Gy
Reference Point Session Dosage Given: 2 Gy
Session Number: 28

## 2023-09-28 ENCOUNTER — Telehealth: Payer: Self-pay

## 2023-09-28 ENCOUNTER — Other Ambulatory Visit: Payer: Self-pay

## 2023-09-28 ENCOUNTER — Ambulatory Visit: Payer: 59

## 2023-09-28 ENCOUNTER — Ambulatory Visit
Admission: RE | Admit: 2023-09-28 | Discharge: 2023-09-28 | Disposition: A | Discharge status: Home / Self Care | Payer: 59 | Source: Ambulatory Visit | Attending: Radiation Oncology | Admitting: Radiation Oncology

## 2023-09-28 DIAGNOSIS — Z510 Encounter for antineoplastic radiation therapy: Secondary | ICD-10-CM | POA: Diagnosis not present

## 2023-09-28 DIAGNOSIS — C3412 Malignant neoplasm of upper lobe, left bronchus or lung: Secondary | ICD-10-CM | POA: Diagnosis not present

## 2023-09-28 DIAGNOSIS — Z87891 Personal history of nicotine dependence: Secondary | ICD-10-CM | POA: Diagnosis not present

## 2023-09-28 LAB — RAD ONC ARIA SESSION SUMMARY
Course Elapsed Days: 42
Plan Fractions Treated to Date: 29
Plan Prescribed Dose Per Fraction: 2 Gy
Plan Total Fractions Prescribed: 30
Plan Total Prescribed Dose: 60 Gy
Reference Point Dosage Given to Date: 58 Gy
Reference Point Session Dosage Given: 2 Gy
Session Number: 29

## 2023-09-28 NOTE — Telephone Encounter (Signed)
Call placed to [employer] device rep, spoke with Joni Reining to make aware that patient does not know how to send over transmission of ICD. Per Joni Reining transmission appointment will be made for patients ICD to be checked on11/07/24. Patient will complete radiation treatment  on 09/29/23.

## 2023-09-29 ENCOUNTER — Ambulatory Visit
Admission: RE | Admit: 2023-09-29 | Discharge: 2023-09-29 | Disposition: A | Discharge status: Home / Self Care | Payer: 59 | Source: Ambulatory Visit | Attending: Radiation Oncology | Admitting: Radiation Oncology

## 2023-09-29 ENCOUNTER — Ambulatory Visit: Payer: 59

## 2023-09-29 ENCOUNTER — Other Ambulatory Visit: Payer: Self-pay

## 2023-09-29 DIAGNOSIS — Z87891 Personal history of nicotine dependence: Secondary | ICD-10-CM | POA: Diagnosis not present

## 2023-09-29 DIAGNOSIS — Z510 Encounter for antineoplastic radiation therapy: Secondary | ICD-10-CM | POA: Diagnosis not present

## 2023-09-29 DIAGNOSIS — C3412 Malignant neoplasm of upper lobe, left bronchus or lung: Secondary | ICD-10-CM | POA: Diagnosis not present

## 2023-09-29 LAB — RAD ONC ARIA SESSION SUMMARY
Course Elapsed Days: 43
Plan Fractions Treated to Date: 30
Plan Prescribed Dose Per Fraction: 2 Gy
Plan Total Fractions Prescribed: 30
Plan Total Prescribed Dose: 60 Gy
Reference Point Dosage Given to Date: 60 Gy
Reference Point Session Dosage Given: 2 Gy
Session Number: 30

## 2023-10-03 DIAGNOSIS — C3490 Malignant neoplasm of unspecified part of unspecified bronchus or lung: Secondary | ICD-10-CM | POA: Diagnosis not present

## 2023-10-03 DIAGNOSIS — J4520 Mild intermittent asthma, uncomplicated: Secondary | ICD-10-CM | POA: Diagnosis not present

## 2023-10-03 DIAGNOSIS — Z0000 Encounter for general adult medical examination without abnormal findings: Secondary | ICD-10-CM | POA: Diagnosis not present

## 2023-10-03 DIAGNOSIS — N189 Chronic kidney disease, unspecified: Secondary | ICD-10-CM | POA: Diagnosis not present

## 2023-10-03 DIAGNOSIS — H42 Glaucoma in diseases classified elsewhere: Secondary | ICD-10-CM | POA: Diagnosis not present

## 2023-10-03 DIAGNOSIS — I129 Hypertensive chronic kidney disease with stage 1 through stage 4 chronic kidney disease, or unspecified chronic kidney disease: Secondary | ICD-10-CM | POA: Diagnosis not present

## 2023-10-03 DIAGNOSIS — H401133 Primary open-angle glaucoma, bilateral, severe stage: Secondary | ICD-10-CM | POA: Diagnosis not present

## 2023-10-04 ENCOUNTER — Other Ambulatory Visit: Payer: 59

## 2023-10-04 ENCOUNTER — Ambulatory Visit: Payer: 59 | Admitting: Internal Medicine

## 2023-10-11 ENCOUNTER — Ambulatory Visit: Payer: 59 | Attending: Cardiovascular Disease | Admitting: Cardiovascular Disease

## 2023-10-11 ENCOUNTER — Encounter: Payer: Self-pay | Admitting: Cardiovascular Disease

## 2023-10-11 DIAGNOSIS — Z95810 Presence of automatic (implantable) cardiac defibrillator: Secondary | ICD-10-CM | POA: Diagnosis not present

## 2023-10-11 DIAGNOSIS — I4729 Other ventricular tachycardia: Secondary | ICD-10-CM

## 2023-10-11 DIAGNOSIS — C3412 Malignant neoplasm of upper lobe, left bronchus or lung: Secondary | ICD-10-CM | POA: Diagnosis not present

## 2023-10-11 DIAGNOSIS — I5042 Chronic combined systolic (congestive) and diastolic (congestive) heart failure: Secondary | ICD-10-CM | POA: Diagnosis not present

## 2023-10-11 DIAGNOSIS — I10 Essential (primary) hypertension: Secondary | ICD-10-CM | POA: Diagnosis not present

## 2023-10-11 DIAGNOSIS — I5022 Chronic systolic (congestive) heart failure: Secondary | ICD-10-CM | POA: Diagnosis not present

## 2023-10-11 DIAGNOSIS — I480 Paroxysmal atrial fibrillation: Secondary | ICD-10-CM

## 2023-10-11 DIAGNOSIS — I495 Sick sinus syndrome: Secondary | ICD-10-CM

## 2023-10-11 DIAGNOSIS — I2510 Atherosclerotic heart disease of native coronary artery without angina pectoris: Secondary | ICD-10-CM

## 2023-10-11 DIAGNOSIS — I441 Atrioventricular block, second degree: Secondary | ICD-10-CM | POA: Diagnosis not present

## 2023-10-11 NOTE — Patient Instructions (Addendum)
Medication Instructions:  No medication changes *If you need a refill on your cardiac medications before your next appointment, please call your pharmacy*   Lab Work: No labs ordered If you have labs (blood work) drawn today and your tests are completely normal, you will receive your results only by: MyChart Message (if you have MyChart) OR A paper copy in the mail If you have any lab test that is abnormal or we need to change your treatment, we will call you to review the results.   Testing/Procedures: No testing   Follow-Up: At [facility], you and your health needs are our priority.  As part of our continuing mission to provide you with exceptional heart care, we have created designated Provider Care Teams.  These Care Teams include your primary Cardiologist (physician) and Advanced Practice Providers (APPs -  Physician Assistants and Nurse Practitioners) who all work together to provide you with the care you need, when you need it.  We recommend signing up for the patient portal called "MyChart".  Sign up information is provided on this After Visit Summary.  MyChart is used to connect with patients for Virtual Visits (Telemedicine).  Patients are able to view lab/test results, encounter notes, upcoming appointments, etc.  Non-urgent messages can be sent to your provider as well.   To learn more about what you can do with MyChart, go to [email or web address].    Your next appointment:   6 month(s)  Provider:   Dr. Rachelle Hora Croitoru

## 2023-10-11 NOTE — Progress Notes (Signed)
Cardiology Office Note    Date:  10/18/2023   ID:  [name], DOB [birth year], MRN [id number]  PCP:  [name], MD  Cardiologist:  Nicki Guadalajara, MD   No chief complaint on file.  21 month F/U  History of Present Illness:  [name] is a 82 y.o. male  who has a  history of a dilated cardiomyopathy with remote ejection fractions in the 35-45% range. In  December 2012 he presented with acute congestive heart failure requiring BiPAP therapy and during his hospitalization ejection fraction dropped to 20-25%. He ruled in for non-ST segment elevation MI and was not found to have high-grade obstructive CAD. He did wear life-vest transiently and with improvement of his ejection fraction to approximately 35-40% in April 2013 his life as was discontinued. He also has a history of intermittent second-degree AV block lead leading to discontinuance of his beta blocker therapy and reduction of his amiodarone dose. An echo Doppler study in March 2014 revealed an ejection fraction that had increased to approximately 55%. 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   He was hospitalized in May 2016 with chest pain.  His cardiac enzymes were negative for MI.  He had an exercise Myoview study on 04/10/2015 and on the stress portion of the study he had episodes of nonsustained VT.  He did not have chest pain or diagnostic ECG changes and the result was felt to be low risk. During that hospitalization a follow-up echo Doppler study showed an ejection fraction that had improved to 45-50% with inferior hypokinesis.  There was mild LVH and grade 1 diastolic dysfunction.  During his hospitalization he did have some bradycardia.   He was evaluated in th ER on 01/01/17 for palpitaions and was felt to have an URI. He was febrile to 100.6.  Laboratory showed a Cr 1.74 which had increased from 1.37 10 months ago. He denies any chest pain.  He denies PND or orthopnea.  He is unaware of any recurrent palpitations.  He  denies significant edema.     He has noticed very rare twinges of atypical chest pain which is short-lived and nonexertional and resolve spontaneously.  He has been taking spironolactone 12.5 mg daily, furosemide 20 mg every other day, amlodipine 10 mg in addition to isosorbide 30 mg.  He denies any anginal type symptomatology.  His has not had any significant edema on this regimen.  Laboratory showed improvement in his creatinine at 1.37 from 1.74 in February 2018.  Lipid studies were excellent, not on therapy with a total cholesterol 111, triglycerides 42, HDL 50, and LDL 53.  He sees Dr. Parke Simmers at three-month intervals.     He was evaluated in the emergency room in December 2018.  He had some chest discomfort.  There were no acute findings.  Chest x-ray was negative.  Troponins were negative 2.  Subsequently, he has felt well.     When I saw him on Apr 07, 2018 he remained asymptomatic with stable blood pressure without chest pain on  amlodipine 10 mg, furosemide 20 g, isosorbide 30 mg daily in addition to spironolactone 12.5 mg daily.  His ECG at that time showed sinus rhythm with PVCs in a bigeminal rhythm for which he was asymptomatic.   He underwent an echo Doppler study on Apr 13, 2018 which showed normal LV function without wall motion abnormality, grade 2 diastolic dysfunction, mild biatrial enlargement, and mild pulmonary hypertension with a peak PA pressure  39 mm.  Laboratory in May 2019 had shown normal TSH of 1.27.  He has chronic kidney disease stage III with creatinine 1.6.  Magnesium level was normal.     During my  evaluation in July 2019 he was asymptomatic with reference to dizziness or lightheadedness but his heart rate was in the 30s and he appeared to have second-degree block intermittent junctional rhythm and I suspected significant sick sinus syndrome.  I referred him to Dr. Royann Shivers for further evaluation.   He was admitted on July 12, 2018 and underwent insertion of a new  dual-chamber permanent pacemaker for symptomatic bradycardia,2nd degree AV block, and sinus node dysfunction by Dr. Royann Shivers. Subsequently, when last seen by Dr. Jomarie Longs he had developed 2 very brief episodes of PAT and had a  short episode of PAF.  I evaluated him in a telemedicine visit in May 2020.  At that time he remained stable with reference to his nonischemic cardiomyopathy.  However blood pressure was elevated and I further titrated Toprol-XL to 37.5 mg. He denied any chest pain or shortness of breath.  There was no leg swelling.  He has not been able to walk with the COVID-19 pandemic.  At times he notes a rare palpitation. Of note, he was found to have an episode of PAF on remote monitoring. Dr. Royann Shivers had discussed this with him and with the patient's desire not to start anticoagulation therapy the decision was made not to institute anticoagulation with his burden less than 1%.  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 and age of 39.  He saw Dr. Royann Shivers for pacemaker follow-up on October 22, 2019.  He was feeling well and only had mild shortness of breath if he try to run or walk very fast.  Pacemaker interrogation showed sinus bradycardia with second-degree AV block with Mobitz type I with ventricular rate in the high 30s to low 40s as his underlying rhythm.  He had 89% atrial pacing and 95% ventricular pacing.  Atrial fibrillation burden remains very low at less than 1% but he had several episodes that were prolonged episodes of asymptomatic PAF longest lasting >3 hours.  I saw him on October 25, 2019 at which time he felt well and denied any chest pain, PND orthopnea.  He denied any presyncope or syncope.    He was hospitalized from  December 15 through November 09, 2019 after presenting with sudden loss of consciousness without any prodrome of chest pain prior to the episode.  He was felt to have syncope with VT.  During that hospitalization he underwent repeat cardiac catheterization on November 07, 2019 by Dr. Kirke Corin which showed stable 80% stenosis in the diagonal branch, unchanged from 2009.  Remotely had undergone a prior pacemaker insertion and had undergone ablation for PVCs.  During his hospitalization in December, he underwent a BiV ICD upgrade and had a subtotally occluded SCV was traversed with a Glidewire by Dr. Sharrell Ku.  He saw Dr. Ladona Ridgel in follow-up on February 20, 2020 and he remained asymptomatic.  His chronic systolic heart failure was well controlled and he was euvolemic with class II symptoms.  He was maintaining sinus rhythm without recurrent PAF, no longer on amiodarone.   saw him on June 20, 2020. He had sustained a left fibula metatarsal fracture earlier this year.  Apparently the patient stopped taking Eliquis in November 2020.  He denied chest pain.  At times there is some very minimal shortness of breath.  He was unaware of palpitations.   He underwent an echo Doppler study in August 2021 which showed an EF of 40 to 45% and grade 1 diastolic dysfunction. There was severe akinesis of the inferior wall.  He has seen Dr. Delton Coombes who is following him for a lung nodule.  He saw Dr. Royann Shivers in January 2022 and  pacemaker interrogation normal device function.  I last saw him on January 12, 2021.  At that time he denied any chest pain and admitted to to some shortness of breath.  He had laboratory in December 2021 which showed total cholesterol 115 LDL cholesterol 63 triglycerides 73 and HDL 37.  Creatinine was 1.38.  He was evaluated by [name], PA-C in August 2 for preoperative clearance prior to undergoing cystoscopy to evaluate for hematuria.  He continues to be stable without chest pain and appears  euvolemic.  He was on metoprolol succinate with his PAF history with less than 1% burden on previous interrogation.  He was evaluated in November 2022 by Dr. Royann Shivers.  At that time, due to recurrent episodes of atrial fibrillation, there was further discussion regarding resumption of anticoagulation.  In the past he had issues with significant nosebleeds.  He had not had any further VT following his pacemaker upgrade to a CRT device by Dr. Ladona Ridgel.  I last saw him on January 12, 2022 at which time he felt well.  He denied any chest pain.   If he walks fast he does experience some mild shortness of breath.  He is unaware of any episodes of presyncope or recurrent syncope.  Is not had an echo Doppler study since April 2021.  He back on low-dose Eliquis to 2.5 mg twice a day with rare occasional nosebleeds.  He is on furosemide 20 mg daily, metoprolol succinate 100 mg, spironolactone 12.5 mg twice a day and isosorbide mononitrate 30 mg.  He takes albuterol on a as needed basis.    Since I saw him, he was evaluated by Dr. Royann Shivers on January 17, 2023.  ICD interrogation showed estimated generator longevity at 3.6 to 3.9 years.  He was 92% atrial and 95% biventricular pacing.  He underwent an echo Doppler study on April 30, 2023 which showed EF at 35 to 40% with grade 1 diastolic dysfunction.  The anterolateral wall, posterior wall and basal inferior segment were akinetic.  Left atrium was mild to moderately dilated.  There was aortic valve sclerosis without stenosis.  He has subsequently seen Whitney Muse born NP on May 06, 2023 and most recently was seen by Ames Dura, NP for [facility] pulmonology.  He had undergone robotic assisted navigational bronchoscopy with endobronchial ultrasound by Dr. Delton Coombes on July 05, 2023.  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  Lymph node biopsies were negative.  Presently, from a cardiac standpoint he has remained stable.  He is undergoing chemo and radiation  therapy for his lung cancer.  He is now seeing Dr. Shirline Frees for oncology.  He is on Eliquis for anticoagulation at reduced dose 2.5 mg twice a day.  He takes furosemide 20 mg every other day, Entresto 24/26 twice a day, spironolactone 12.5 mg daily and metoprolol succinate 100 mg in the morning.  He is no longer on isosorbide.  He presents  for evaluation.   Past Medical History:  Diagnosis Date   AICD (automatic cardioverter/defibrillator) present    CAD (coronary artery disease) 80% stenosis diag of the LAD, 30% in OM2 branch of LCX in 2009    a. Nonobstructive CAD by cath 11/2011 with the exception of the pre-existing diagonal branch #2 lesion.   Chronic systolic CHF (congestive heart failure) (HCC)    CKD (chronic kidney disease) stage 3, GFR 30-59 ml/min (HCC)    Colon polyp, hyperplastic    History of kidney stones    History of stress test 06/01/2012   Normal myocardial perfusion study. compared to the previous study there is no significant change. this is a low risk scan   Hypertension    Legally blind    "both eyes"   Myocardial infarction [facility]) 11/22/2011   NICM (nonischemic cardiomyopathy) (HCC)    a. Remote hx of dilated NICM with EF ranging 20-45%, including normal EF by echo (55-60%) in 2014.   Peripheral arterial disease (HCC)    a. 06/2014: ABI right 0.99, left 1.2, LE dopplers revealing an occluded right posterior tibial. As symptoms were not felt r/t claudication, no further w/u at the time.   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 Requiring discontinuation of BB dose.   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    Past Surgical History:  Procedure Laterality Date   BACK SURGERY     BIV UPGRADE N/A 11/08/2019   Procedure: BIV UPGRADE;  Surgeon: Marinus Maw, MD;  Location: [facility] INVASIVE CV LAB;  Service: Cardiovascular;  Laterality: N/A;    BRONCHIAL BIOPSY  07/05/2023   Procedure: BRONCHIAL BIOPSIES;  Surgeon: Leslye Peer, MD;  Location: [facility] ENDOSCOPY;  Service: Pulmonary;;   BRONCHIAL BRUSHINGS  07/05/2023   Procedure: BRONCHIAL BRUSHINGS;  Surgeon: Leslye Peer, MD;  Location: [facility] ENDOSCOPY;  Service: Pulmonary;;   BRONCHIAL NEEDLE ASPIRATION BIOPSY  07/05/2023   Procedure: BRONCHIAL NEEDLE ASPIRATION BIOPSIES;  Surgeon: Leslye Peer, MD;  Location: [facility] ENDOSCOPY;  Service: Pulmonary;;   BRONCHIAL WASHINGS  07/05/2023   Procedure: BRONCHIAL WASHINGS;  Surgeon: Leslye Peer, MD;  Location: [facility] ENDOSCOPY;  Service: Pulmonary;;   CARDIAC CATHETERIZATION  11/2011   CARDIAC CATHETERIZATION  11/2011   didn't demonstrate high grade obstructive disease to account for his LV dysfunction.   CATARACT EXTRACTION, BILATERAL  1990's   CYSTOSCOPY     CYSTOSCOPY WITH BIOPSY Bilateral 08/03/2021   Procedure: CYSTOSCOPY WITH BLADDER BIOPSY, BILATERAL RETROGRADE PYELOGRAM;  Surgeon: Crista Elliot, MD;  Location: [facility] ORS;  Service: Urology;  Laterality: Bilateral;  REQUESTING 45 MINS   CYSTOSCOPY WITH URETHRAL DILATATION N/A 05/04/2013   Procedure: CYSTOSCOPY WITH URETHRAL DILATATION;  Surgeon: Tia Alert, MD;  Location: [facility] NEURO ORS;  Service: Neurosurgery;  Laterality: N/A;  with insertion of foley catheter   EP study and ablation of VT  7/13   PVC focus mapped to the right coronary cusp of the aorta, limited ablation performed due to proximity of the focus to the right coronary artery   EYE SURGERY     FINE NEEDLE ASPIRATION  07/05/2023   Procedure: FINE NEEDLE ASPIRATION (FNA) LINEAR;  Surgeon: Leslye Peer, MD;  Location: [facility] ENDOSCOPY;  Service: Pulmonary;;   LEFT HEART CATH AND CORONARY ANGIOGRAPHY N/A 11/07/2019   Procedure: LEFT HEART CATH AND CORONARY ANGIOGRAPHY;  Surgeon:  Iran Ouch, MD;  Location: [facility] INVASIVE CV LAB;  Service: Cardiovascular;  Laterality: N/A;   LEFT HEART CATHETERIZATION WITH CORONARY ANGIOGRAM N/A  11/25/2011   Procedure: LEFT HEART CATHETERIZATION WITH CORONARY ANGIOGRAM;  Surgeon: Marykay Lex, MD;  Location: [facility] CATH LAB;  Service: Cardiovascular;  Laterality: N/A;   PACEMAKER IMPLANT N/A 07/12/2018   Procedure: PACEMAKER IMPLANT;  Surgeon: Thurmon Fair, MD;  Location: [facility] INVASIVE CV LAB;  Service: Cardiovascular;  Laterality: N/A;   POSTERIOR FUSION LUMBAR SPINE  1979   ROTATOR CUFF REPAIR  2000's   left   V-TACH ABLATION N/A 06/06/2012   Procedure: V-TACH ABLATION;  Surgeon: Hillis Range, MD;  Location: [facility] CATH LAB;  Service: Cardiovascular;  Laterality: N/A;   VIDEO BRONCHOSCOPY WITH ENDOBRONCHIAL ULTRASOUND N/A 07/05/2023   Procedure: VIDEO BRONCHOSCOPY WITH ENDOBRONCHIAL ULTRASOUND;  Surgeon: Leslye Peer, MD;  Location: [facility] ENDOSCOPY;  Service: Pulmonary;  Laterality: N/A;    Current Medications: Outpatient Medications Prior to Visit  Medication Sig Dispense Refill   albuterol (VENTOLIN HFA) 108 (90 Base) MCG/ACT inhaler Inhale 1-2 puffs into the lungs every 6 (six) hours as needed for wheezing or shortness of breath.     apixaban (ELIQUIS) 2.5 MG TABS tablet Take 1 tablet (2.5 mg total) by mouth 2 (two) times daily. Okay to restart this medication on 07/06/2023     brimonidine (ALPHAGAN) 0.2 % ophthalmic solution Place 1 drop into both eyes 2 (two) times daily.     cycloSPORINE (RESTASIS) 0.05 % ophthalmic emulsion Place 1 drop into both eyes 2 (two) times daily.     dorzolamide-timolol (COSOPT) 22.3-6.8 MG/ML ophthalmic solution Place 1 drop into both eyes 2 (two) times daily.     furosemide (LASIX) 20 MG tablet Take 1 tablet (20 mg total) by mouth See admin instructions. Take 20 mg by mouth EVERY OTHER MORNING     metoprolol succinate (TOPROL-XL) 100 MG 24 hr tablet Take 1 tablet (100 mg total) by mouth in the morning. Take with or immediately following a meal.     nitroGLYCERIN (NITROSTAT) 0.4 MG SL tablet Place 1 tablet (0.4 mg total) under the tongue every 5 (five)  minutes as needed for chest pain. 25 tablet 1   ROCKLATAN 0.02-0.005 % SOLN Apply 1 drop to eye at bedtime.     sacubitril-valsartan (ENTRESTO) 24-26 MG Take 1 tablet by mouth 2 (two) times daily. 60 tablet 11   spironolactone (ALDACTONE) 25 MG tablet Take 0.5 tablets (12.5 mg total) by mouth daily. 90 tablet 2   sucralfate (CARAFATE) 1 g tablet Crush and dissolve in 4 oz of warm water prior to swallowing. Can be taken 3 times daily with meals and at bedtime. 120 tablet 0   BREO ELLIPTA 100-25 MCG/INH AEPB Inhale 1 puff into the lungs every morning. (Patient not taking: Reported on 08/04/2023)     diclofenac Sodium (VOLTAREN) 1 % GEL Apply 2 g topically 4 (four) times daily. (Patient not taking: Reported on 08/04/2023) 100 g 0   isosorbide mononitrate (IMDUR) 30 MG 24 hr tablet Take 30 mg by mouth daily. (Patient not taking: Reported on 08/04/2023)     prochlorperazine (COMPAZINE) 10 MG tablet Take 1 tablet (10 mg total) by mouth every 6 (six) hours as needed. (Patient not taking: Reported on 08/04/2023) 30 tablet 2   No facility-administered medications prior to visit.     Allergies:   Paclitaxel, Allegra [fexofenadine], and Sonafine [wound dressings]   Social History   Socioeconomic History  Marital status: Legally Separated    Spouse name: Not on file   Number of children: 3   Years of education: Not on file   Highest education level: Not on file  Occupational History   Occupation: Retired    Associate Professor: RETIRED  Tobacco Use   Smoking status: Former    Current packs/day: 0.00    Types: Cigarettes    Start date: 11/22/1949    Quit date: 11/23/1999    Years since quitting: 23.9    Passive exposure: Current   Smokeless tobacco: Never  Vaping Use   Vaping status: Never Used  Substance and Sexual Activity   Alcohol use: No    Comment: 05/26/12 "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; stopped drinking in the early 1980's"   Drug use: No   Sexual activity: Yes  Other Topics Concern   Not on file  Social  History Narrative   Not on file   Social Determinants of Health   Financial Resource Strain: Not on file  Food Insecurity: No Food Insecurity (08/04/2023)   Hunger Vital Sign    Worried About Running Out of Food in the Last Year: Never true    Ran Out of Food in the Last Year: Never true  Transportation Needs: No Transportation Needs (08/04/2023)   PRAPARE - Administrator, Civil Service (Medical): No    Lack of Transportation (Non-Medical): No  Physical Activity: Not on file  Stress: Not on file  Social Connections: Not on file    Socially, he is divorced and has 4 children, and 7 grandchildren.  Family History:  The patient's family history includes Asthma in his mother; Other in an other family member.   ROS General: Negative; No fevers, chills, or night sweats;  HEENT: Negative; No changes in vision or hearing, sinus congestion, difficulty swallowing Pulmonary: Lung nodule, COPD, adenocarcinoma Cardiovascular:  See HPI GI: Negative; No nausea, vomiting, diarrhea, or abdominal pain GU: Negative; No dysuria, hematuria, or difficulty voiding Musculoskeletal: Negative; no myalgias, joint pain, or weakness Hematologic/Oncology: Negative; no easy bruising, bleeding Endocrine: Negative; no heat/cold intolerance; no diabetes Neuro: Negative; no changes in balance, headaches Skin: Negative; No rashes or skin lesions Psychiatric: Negative; No behavioral problems, depression Sleep: Negative; No snoring, daytime sleepiness, hypersomnolence, bruxism, restless legs, hypnogognic hallucinations, no cataplexy Other comprehensive 14 point system review is negative.   PHYSICAL EXAM:   VS:  BP 122/86   Pulse 73   Ht 5\' 7"  (1.702 m)   Wt 167 lb 9.6 oz (76 kg)   SpO2 94%   BMI 26.25 kg/m     Repeat blood pressure by me was 124/76  Wt Readings from Last 3 Encounters:  10/11/23 167 lb 9.6 oz (76 kg)  09/26/23 165 lb (74.8 kg)  09/20/23 167 lb 6.4 oz (75.9 kg)    General:  Alert, oriented, no distress.  Skin: normal turgor, no rashes, warm and dry HEENT: Normocephalic, atraumatic. Pupils equal round and reactive to light; sclera anicteric; extraocular muscles intact;  Nose without nasal septal hypertrophy Mouth/Parynx benign; Mallinpatti scale 3 Neck: No JVD, no carotid bruits; normal carotid upstroke Lungs: clear to ausculatation and percussion; no wheezing or rales Chest wall: without tenderness to palpitation Heart: PMI not displaced, RRR with occasional ectopic complex, s1 s2 normal, 1/6 systolic murmur, no diastolic murmur, no rubs, gallops, thrills, or heaves Abdomen: soft, nontender; no hepatosplenomehaly, BS+; abdominal aorta nontender and not dilated by palpation. Back: no CVA tenderness Pulses 2+ Musculoskeletal: full range of  motion, normal strength, no joint deformities Extremities: no clubbing cyanosis or edema, Homan's sign negative  Neurologic: grossly nonfocal; Cranial nerves grossly wnl Psychologic: Normal mood and affect  Studies/Labs Reviewed:   EKG Interpretation Date/Time:  Tuesday October 11 2023 10:20:22 EST Ventricular Rate:  73 PR Interval:  182 QRS Duration:  160 QT Interval:  456 QTC Calculation: 502 R Axis:   -78  Text Interpretation: AV dual-paced rhythm with occasional Premature ventricular complexes When compared with ECG of 02-Jun-2023 10:34, PREVIOUS ECG IS PRESENT Confirmed by Nicki Guadalajara ([id number]) on 10/11/2023 11:03:38 AM    January 12, 2022 ECG (independently read by me): AV paced at 70 bpm.  100% capture  January 12, 2021 ECG (independently read by me): AV paced rhythm at 71;   July 2021 ECG (independently read by me): AV paced rhythm with occasional V paced complexes, PVC.  PR 180 ms.  December 01/2019 ECG (independently read by me): AV paced rhythm at 70 bpm with 100% capture.  PR interval 184 ms, QTc interval 486 ms.  Recent Labs:    Latest Ref Rng & Units 10/18/2023    9:05 AM 09/26/2023    9:55 AM  09/20/2023    8:42 AM  BMP  Glucose 70 - 99 mg/dL 132  440  102   BUN 8 - 23 mg/dL 16  19  17    Creatinine 0.61 - 1.24 mg/dL 7.25  3.66  4.40   Sodium 135 - 145 mmol/L 138  138  139   Potassium 3.5 - 5.1 mmol/L 4.3  4.6  4.4   Chloride 98 - 111 mmol/L 107  108  108   CO2 22 - 32 mmol/L 26  26  26    Calcium 8.9 - 10.3 mg/dL 9.3  9.6  9.6         Latest Ref Rng & Units 10/18/2023    9:05 AM 09/26/2023    9:55 AM 09/20/2023    8:42 AM  Hepatic Function  Total Protein 6.5 - 8.1 g/dL 7.2  7.3  7.4   Albumin 3.5 - 5.0 g/dL 3.4  3.7  3.9   AST 15 - 41 U/L 13  13  15    ALT 0 - 44 U/L 8  9  9    Alk Phosphatase 38 - 126 U/L 40  50  50   Total Bilirubin <1.2 mg/dL 0.7  1.1  0.7        Latest Ref Rng & Units 10/18/2023    9:05 AM 09/26/2023    9:55 AM 09/20/2023    8:42 AM  CBC  WBC 4.0 - 10.5 K/uL 2.9  1.8  2.6   Hemoglobin 13.0 - 17.0 g/dL 34.7  42.5  95.6   Hematocrit 39.0 - 52.0 % 32.1  35.4  35.6   Platelets 150 - 400 K/uL 83  144  99    Lab Results  Component Value Date   MCV 91.7 10/18/2023   MCV 92.9 09/26/2023   MCV 93.0 09/20/2023   Lab Results  Component Value Date   TSH 1.540 01/12/2022   Lab Results  Component Value Date   HGBA1C 5.8 (H) 01/12/2022     BNP    Component Value Date/Time   BNP 399.4 (H) 04/29/2023 1444    ProBNP    Component Value Date/Time   PROBNP 155.4 (H) 03/19/2013 1749     Lipid Panel     Component Value Date/Time   CHOL 118 01/12/2022 0916   TRIG 46  01/12/2022 0916   HDL 47 01/12/2022 0916   CHOLHDL 2.5 01/12/2022 0916   CHOLHDL 2.2 01/24/2017 0950   VLDL 8 01/24/2017 0950   LDLCALC 60 01/12/2022 0916   LABVLDL 11 01/12/2022 0916     RADIOLOGY: No results found.   Additional studies/ records that were reviewed today include:  Study Conclusions   - Left ventricle: The cavity size was normal. Wall thickness was   normal. Systolic function was normal. The estimated ejection   fraction was in the range of 50%  to 55%. Wall motion was normal;   there were no regional wall motion abnormalities. Features are   consistent with a pseudonormal left ventricular filling pattern,   with concomitant abnormal relaxation and increased filling   pressure (grade 2 diastolic dysfunction). - Left atrium: The atrium was mildly dilated. - Right ventricle: The cavity size was mildly dilated. - Right atrium: The atrium was mildly dilated. - Pulmonary arteries: Systolic pressure was mildly increased. PA   peak pressure: 39 mm Hg (S).   Impressions:   - Normal LV systolic function; mild diastolic dysfunction; mild   LAE; mild RAE/RVE; mild TR with mild pulmonary hypertension.   ------------------------------------------------------------------- Labs, prior tests, procedures, and surgery: Transthoracic echocardiography (04/10/2015).     EF was 45%.   ASSESSMENT:    1. Chronic combined systolic and diastolic heart failure (HCC)   2. Paroxysmal atrial fibrillation (HCC)   3. Primary hypertension   4. CAD in native artery   5. SSS (sick sinus syndrome) (HCC)   6. Biventricular automatic implantable cardioverter defibrillator in situ   7. Second degree AV block, Mobitz type I   8. 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 NSVT (nonsustained ventricular tachycardia) [facility])     PLAN:  Mr Bascom is an 82 year old gentleman who has a history of mild CAD, heart failure with reduced EF in December 2012 requiring BiPAP therapy with EF at 20 to 25%.  He had worn a LifeVest transiently and EF ultimately improved to 35 to 40% in 2013 and LifeVest was discontinued.  He has had prior pacemaker insertion and also ablation for PVCs.  He has a history of paroxysmal atrial fibrillation for which he had been on Eliquis.  Apparently he had been off Eliquis since November 2020 due to recurrent nosebleeds..  He developed an episode of VT leading to syncope in December and ultimately underwent repeat catheterization  which essentially was unchanged and showed stable 80% stenosis in a diagonal branch of his LAD with mild 30% narrowing in the LAD.  There was minimal plaque in the circumflex and RCA.  He underwent BiV ICD upgrade by Dr. Ladona Ridgel subsequently has been doing well without recurrent VT or atrial fibrillation.  His last echo Doppler study was in April 2021 which showed an EF of 40 to 45% with grade 1 diastolic dysfunction, mild aortic valve sclerosis without stenosis, and akinesis of the inferior wall.  He has been euvolemic on subsequent examinations.  Due to slight increase AF burden he was started back on Eliquis and now takes reduced dose of 2.5 mg twice a day.  On follow-up echocardiography on April 30, 2023 EF remain low at 35 to 40%.  Left atrium was mild to moderately dilated.  There was mild aortic sclerosis.  Presently, he is back on reduced dose Eliquis 2.5 mg twice a day and is now on metoprolol 100 mg daily, furosemide 20 mg, and Entresto 24/26 mg twice a  day in addition to spironolactone for guideline directed medical therapy.  He has been diagnosed with adenocarcinoma of his lung and is seeing Dr. Shirline Frees and undergoing chemo and radiation treatment.  I discussed with him today my plan for retirement in 2025.  He already sees Dr. Royann Shivers who follows his pacemaker.  I will schedule him for follow-up evaluation with Dr. Royann Shivers will in 6 months and transition his overall cardiology care to Dr. Royann Shivers.  Medication Adjustments/Labs and Tests Ordered: Current medicines are reviewed at length with the patient today.  Concerns regarding medicines are outlined above.  Medication changes, Labs and Tests ordered today are listed in the Patient Instructions below. Patient Instructions  Medication Instructions:  No medication changes *If you need a refill on your cardiac medications before your next appointment, please call your pharmacy*   Lab Work: No labs ordered If you have labs (blood work) drawn  today and your tests are completely normal, you will receive your results only by: MyChart Message (if you have MyChart) OR A paper copy in the mail If you have any lab test that is abnormal or we need to change your treatment, we will call you to review the results.   Testing/Procedures: No testing   Follow-Up: At [facility], you and your health needs are our priority.  As part of our continuing mission to provide you with exceptional heart care, we have created designated Provider Care Teams.  These Care Teams include your primary Cardiologist (physician) and Advanced Practice Providers (APPs -  Physician Assistants and Nurse Practitioners) who all work together to provide you with the care you need, when you need it.  We recommend signing up for the patient portal called "MyChart".  Sign up information is provided on this After Visit Summary.  MyChart is used to connect with patients for Virtual Visits (Telemedicine).  Patients are able to view lab/test results, encounter notes, upcoming appointments, etc.  Non-urgent messages can be sent to your provider as well.   To learn more about what you can do with MyChart, go to [email or web address].    Your next appointment:   6 month(s)  Provider:   Dr. Rachelle Hora Croitoru        Signed, Nicki Guadalajara, MD  10/18/2023 6:26 PM    [facility] [address], [address], [place], [place]  [address] Phone: [phone]

## 2023-10-18 ENCOUNTER — Inpatient Hospital Stay: Payer: 59

## 2023-10-18 ENCOUNTER — Encounter: Payer: Self-pay | Admitting: Cardiovascular Disease

## 2023-10-18 ENCOUNTER — Ambulatory Visit (HOSPITAL_COMMUNITY)
Admission: RE | Admit: 2023-10-18 | Discharge: 2023-10-18 | Disposition: A | Discharge status: Home / Self Care | Payer: 59 | Source: Ambulatory Visit | Attending: Internal Medicine | Admitting: Internal Medicine

## 2023-10-18 DIAGNOSIS — C3490 Malignant neoplasm of unspecified part of unspecified bronchus or lung: Secondary | ICD-10-CM | POA: Insufficient documentation

## 2023-10-18 DIAGNOSIS — J439 Emphysema, unspecified: Secondary | ICD-10-CM | POA: Diagnosis not present

## 2023-10-18 DIAGNOSIS — I700 Atherosclerosis of aorta: Secondary | ICD-10-CM | POA: Diagnosis not present

## 2023-10-18 DIAGNOSIS — C3412 Malignant neoplasm of upper lobe, left bronchus or lung: Secondary | ICD-10-CM

## 2023-10-18 DIAGNOSIS — C779 Secondary and unspecified malignant neoplasm of lymph node, unspecified: Secondary | ICD-10-CM | POA: Diagnosis not present

## 2023-10-18 LAB — CBC WITH DIFFERENTIAL (CANCER CENTER ONLY)
Abs Immature Granulocytes: 0.01 10*3/uL (ref 0.00–0.07)
Basophils Absolute: 0 10*3/uL (ref 0.0–0.1)
Basophils Relative: 0 %
Eosinophils Absolute: 0.2 10*3/uL (ref 0.0–0.5)
Eosinophils Relative: 6 %
HCT: 32.1 % — ABNORMAL LOW (ref 39.0–52.0)
Hemoglobin: 10.9 g/dL — ABNORMAL LOW (ref 13.0–17.0)
Immature Granulocytes: 0 %
Lymphocytes Relative: 13 %
Lymphs Abs: 0.4 10*3/uL — ABNORMAL LOW (ref 0.7–4.0)
MCH: 31.1 pg (ref 26.0–34.0)
MCHC: 34 g/dL (ref 30.0–36.0)
MCV: 91.7 fL (ref 80.0–100.0)
Monocytes Absolute: 0.5 10*3/uL (ref 0.1–1.0)
Monocytes Relative: 16 %
Neutro Abs: 1.9 10*3/uL (ref 1.7–7.7)
Neutrophils Relative %: 65 %
Platelet Count: 83 10*3/uL — ABNORMAL LOW (ref 150–400)
RBC: 3.5 MIL/uL — ABNORMAL LOW (ref 4.22–5.81)
RDW: 13.3 % (ref 11.5–15.5)
WBC Count: 2.9 10*3/uL — ABNORMAL LOW (ref 4.0–10.5)
nRBC: 0 % (ref 0.0–0.2)

## 2023-10-18 LAB — CMP (CANCER CENTER ONLY)
ALT: 8 U/L (ref 0–44)
AST: 13 U/L — ABNORMAL LOW (ref 15–41)
Albumin: 3.4 g/dL — ABNORMAL LOW (ref 3.5–5.0)
Alkaline Phosphatase: 40 U/L (ref 38–126)
Anion gap: 5 (ref 5–15)
BUN: 16 mg/dL (ref 8–23)
CO2: 26 mmol/L (ref 22–32)
Calcium: 9.3 mg/dL (ref 8.9–10.3)
Chloride: 107 mmol/L (ref 98–111)
Creatinine: 1.53 mg/dL — ABNORMAL HIGH (ref 0.61–1.24)
GFR, Estimated: 45 mL/min — ABNORMAL LOW (ref >60)
Glucose, Bld: 133 mg/dL — ABNORMAL HIGH (ref 70–99)
Potassium: 4.3 mmol/L (ref 3.5–5.1)
Sodium: 138 mmol/L (ref 135–145)
Total Bilirubin: 0.7 mg/dL (ref <1.2)
Total Protein: 7.2 g/dL (ref 6.5–8.1)

## 2023-10-21 ENCOUNTER — Other Ambulatory Visit: Payer: Self-pay | Admitting: Cardiovascular Disease

## 2023-10-27 ENCOUNTER — Inpatient Hospital Stay: Payer: 59 | Attending: Internal Medicine | Admitting: Internal Medicine

## 2023-10-27 DIAGNOSIS — I2510 Atherosclerotic heart disease of native coronary artery without angina pectoris: Secondary | ICD-10-CM | POA: Insufficient documentation

## 2023-10-27 DIAGNOSIS — R42 Dizziness and giddiness: Secondary | ICD-10-CM | POA: Insufficient documentation

## 2023-10-27 DIAGNOSIS — Z860102 Personal history of hyperplastic colon polyps: Secondary | ICD-10-CM | POA: Insufficient documentation

## 2023-10-27 DIAGNOSIS — Z7901 Long term (current) use of anticoagulants: Secondary | ICD-10-CM | POA: Insufficient documentation

## 2023-10-27 DIAGNOSIS — Z9221 Personal history of antineoplastic chemotherapy: Secondary | ICD-10-CM | POA: Insufficient documentation

## 2023-10-27 DIAGNOSIS — R0609 Other forms of dyspnea: Secondary | ICD-10-CM | POA: Insufficient documentation

## 2023-10-27 DIAGNOSIS — R5383 Other fatigue: Secondary | ICD-10-CM | POA: Insufficient documentation

## 2023-10-27 DIAGNOSIS — Z923 Personal history of irradiation: Secondary | ICD-10-CM | POA: Insufficient documentation

## 2023-10-27 DIAGNOSIS — R0789 Other chest pain: Secondary | ICD-10-CM | POA: Insufficient documentation

## 2023-10-27 DIAGNOSIS — I252 Old myocardial infarction: Secondary | ICD-10-CM | POA: Insufficient documentation

## 2023-10-27 DIAGNOSIS — Z87442 Personal history of urinary calculi: Secondary | ICD-10-CM | POA: Insufficient documentation

## 2023-10-27 DIAGNOSIS — C3412 Malignant neoplasm of upper lobe, left bronchus or lung: Secondary | ICD-10-CM | POA: Insufficient documentation

## 2023-10-27 DIAGNOSIS — Z79899 Other long term (current) drug therapy: Secondary | ICD-10-CM | POA: Insufficient documentation

## 2023-10-27 DIAGNOSIS — R0602 Shortness of breath: Secondary | ICD-10-CM | POA: Insufficient documentation

## 2023-10-27 DIAGNOSIS — R590 Localized enlarged lymph nodes: Secondary | ICD-10-CM | POA: Insufficient documentation

## 2023-11-01 ENCOUNTER — Encounter: Payer: Self-pay | Admitting: Radiation Oncology

## 2023-11-02 NOTE — Progress Notes (Signed)
Radiation Oncology         [phone]) [phone] ________________________________  Name: [name] MRN: [id number]  Date: 11/03/2023  DOB: 06-Apr-1941  Follow-Up Visit Note  CC: [name], MD  Si Gaul, MD  No diagnosis found.  Diagnosis: Non-small cell lung cancer - adenocarcinoma of the left upper lobe. Presented with a left upper lobe lung mass, a lesion in the lingula, and bilateral hypermetabolic mediastinal lymph nodes (also with hypermetabolic bilateral axillary lymph nodes which have since been proven benign by biopsy) Stage III-C (T3, N3. M0)     Interval Since Last Radiation: 1 month and 5 days   Indication for treatment:  Curative        Radiation treatment dates:  08/17/23 through 09/29/23    Site/dose:    Site: Left lung  Dose: 60 Gy delivered in 30 Fx at 2.00 Gy/Fx Technique: 3D  Narrative:  The patient returns today for routine follow-up. He tolerated radiation treatment relatively well. During his final weekly treatment check on 11/06, the patient endorsed fatigue, some skin darkening, cough, and a sore throat which he was given a prescription of carafate for.         To review, the patient is also receiving concurrent chemotherapy with carboplatin and paclitaxel (first dose on 08/15/23) under the care of Dr. Arbutus Ped. Starting from cycle 2, he was transitioned to carboplatin and Abraxane secondary to hypersensitivity reaction to paclitaxel. His treatment has also been intermittently delayed due to low platelet counts, and his missed his most recent appointment with Dr. Arbutus Ped on 12/05.   Dr. Arbutus Ped may recommend immunotherapy after he completes his current treatment course.   Pertinent imaging performed in the interval includes a chest CT without contrast on 10/18/23 which demonstrated the stability of the predominantly solid nodule in the left lung apex with a dominant 2.6 cm solid component, corresponding to the known primary bronchogenic carcinoma. CT  also showed stability of the previously demonstrated small mediastinal lymph nodes, suggestive of nodal metastatic disease, and interval improvement of the patchy nodular opacity in the lingula, possibly representing an infectious/inflammatory process.    ***                         Allergies:  is allergic to paclitaxel, allegra [fexofenadine], and sonafine [wound dressings].  Meds: Current Outpatient Medications  Medication Sig Dispense Refill   albuterol (VENTOLIN HFA) 108 (90 Base) MCG/ACT inhaler Inhale 1-2 puffs into the lungs every 6 (six) hours as needed for wheezing or shortness of breath.     apixaban (ELIQUIS) 2.5 MG TABS tablet Take 1 tablet (2.5 mg total) by mouth 2 (two) times daily. Okay to restart this medication on 07/06/2023     BREO ELLIPTA 100-25 MCG/INH AEPB Inhale 1 puff into the lungs every morning. (Patient not taking: Reported on 08/04/2023)     brimonidine (ALPHAGAN) 0.2 % ophthalmic solution Place 1 drop into both eyes 2 (two) times daily.     cycloSPORINE (RESTASIS) 0.05 % ophthalmic emulsion Place 1 drop into both eyes 2 (two) times daily.     diclofenac Sodium (VOLTAREN) 1 % GEL Apply 2 g topically 4 (four) times daily. (Patient not taking: Reported on 08/04/2023) 100 g 0   dorzolamide-timolol (COSOPT) 22.3-6.8 MG/ML ophthalmic solution Place 1 drop into both eyes 2 (two) times daily.     furosemide (LASIX) 20 MG tablet Take 1 tablet (20 mg total) by mouth See admin instructions. Take 20 mg by  mouth EVERY OTHER MORNING     isosorbide mononitrate (IMDUR) 30 MG 24 hr tablet Take 30 mg by mouth daily. (Patient not taking: Reported on 08/04/2023)     metoprolol succinate (TOPROL-XL) 100 MG 24 hr tablet TAKE 1 TABLET(100 MG) BY MOUTH DAILY WITH OR IMMEDIATELY FOLLOWING A MEAL 90 tablet 3   nitroGLYCERIN (NITROSTAT) 0.4 MG SL tablet Place 1 tablet (0.4 mg total) under the tongue every 5 (five) minutes as needed for chest pain. 25 tablet 1   prochlorperazine (COMPAZINE) 10 MG  tablet Take 1 tablet (10 mg total) by mouth every 6 (six) hours as needed. (Patient not taking: Reported on 08/04/2023) 30 tablet 2   ROCKLATAN 0.02-0.005 % SOLN Apply 1 drop to eye at bedtime.     sacubitril-valsartan (ENTRESTO) 24-26 MG Take 1 tablet by mouth 2 (two) times daily. 60 tablet 11   spironolactone (ALDACTONE) 25 MG tablet Take 0.5 tablets (12.5 mg total) by mouth daily. 90 tablet 2   sucralfate (CARAFATE) 1 g tablet Crush and dissolve in 4 oz of warm water prior to swallowing. Can be taken 3 times daily with meals and at bedtime. 120 tablet 0   No current facility-administered medications for this encounter.    Physical Findings: The patient is in no acute distress. Patient is alert and oriented.  vitals were not taken for this visit. .  No significant changes. Lungs are clear to auscultation bilaterally. Heart has regular rate and rhythm. No palpable cervical, supraclavicular, or axillary adenopathy. Abdomen soft, non-tender, normal bowel sounds.   Lab Findings: Lab Results  Component Value Date   WBC 2.9 (L) 10/18/2023   HGB 10.9 (L) 10/18/2023   HCT 32.1 (L) 10/18/2023   MCV 91.7 10/18/2023   PLT 83 (L) 10/18/2023    Radiographic Findings: CT Chest Wo Contrast  Result Date: 10/28/2023 CLINICAL DATA:  Non-small cell lung cancer, follow-up EXAM: CT CHEST WITHOUT CONTRAST TECHNIQUE: Multidetector CT imaging of the chest was performed following the standard protocol without IV contrast. RADIATION DOSE REDUCTION: This exam was performed according to the departmental dose-optimization program which includes automated exposure control, adjustment of the mA and/or kV according to patient size and/or use of iterative reconstruction technique. COMPARISON:  PET-CT dated 07/01/2023 FINDINGS: Cardiovascular: The heart is normal in size. No pericardial effusion. Left subclavian ICD. No evidence of thoracic aortic aneurysm. Atherosclerotic calcifications of the aortic arch. Severe  three-vessel coronary atherosclerosis. Mediastinum/Nodes: Stable small thoracic nodes, including a dominant 10 mm short axis right paratracheal (series 2/image 60). These were hypermetabolic on PET, favoring small mediastinal nodal metastases. Visualized thyroid is unremarkable. Lungs/Pleura: Predominantly solid nodule in the left lung apex with dominant 2.4 x 2.6 cm solid component (series 5/image 26), grossly unchanged. Improving patchy/nodular opacity in the lingula (series 5/image 89), possibly infectious/inflammatory. Scattered subtle mild peribronchovascular micro nodularity in the lungs bilaterally, upper row predominant, suggesting mild infection. Moderate centrilobular and paraseptal emphysematous changes, upper lung predominant. No pleural effusion or pneumothorax. Upper Abdomen: Visualized upper abdomen is notable for nonobstructing bilateral renal calculi measuring up to 5 mm in the right upper kidney (series 2/image 138). Prior cholecystectomy. Vascular calcifications. Musculoskeletal: Degenerative changes of the visualized thoracolumbar spine. Lower thoracic/upper lumbar posterior spinal fixation with stable wedge deformity at L1. IMPRESSION: Predominantly solid nodule in the left lung apex with dominant 2.6 cm solid component, grossly unchanged. This corresponds to the patient's known primary bronchogenic neoplasm. Associated small mediastinal nodes, unchanged but hypermetabolic on PET, suggesting small nodal metastases. Improving patchy/nodular  opacity in the lingula, possibly infectious/inflammatory. Associated scattered peribronchovascular micronodularity. Aortic Atherosclerosis (ICD10-I70.0) and Emphysema (ICD10-J43.9). Electronically Signed   By: Charline Bills M.D.   On: 10/28/2023 20:41    Impression:  Non-small cell lung cancer - adenocarcinoma of the left upper lobe. Presented with a left upper lobe lung mass, a lesion in the lingula, and bilateral hypermetabolic mediastinal lymph nodes  (also with hypermetabolic bilateral axillary lymph nodes which have since been proven benign by biopsy) Stage III-C (T3, N3. M0)       The patient is recovering from the effects of radiation.  ***  Plan:  ***   *** minutes of total time was spent for this patient encounter, including preparation, face-to-face counseling with the patient and coordination of care, physical exam, and documentation of the encounter. ____________________________________  Billie Lade, PhD, MD  This document serves as a record of services personally performed by Antony Blackbird, MD. It was created on his behalf by Neena Rhymes, a trained medical scribe. The creation of this record is based on the scribe's personal observations and the provider's statements to them. This document has been checked and approved by the attending provider.

## 2023-11-02 NOTE — Progress Notes (Signed)
  Radiation Oncology         [phone] ________________________________  Name: [name] MRN: [id number]  Date: 11/03/2023  DOB: 09/07/1941  End of Treatment Note  Diagnosis: The primary encounter diagnosis was Primary malignant neoplasm of bronchus of left upper lobe (HCC). 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   Non-small cell lung cancer - adenocarcinoma of the left upper lobe. Presented with a left upper lobe lung mass, a lesion in the lingula, and bilateral hypermetabolic mediastinal lymph nodes (also with hypermetabolic bilateral axillary lymph nodes which have since been proven benign by biopsy) Stage III-C (T3, N3. M0)     Indication for treatment:  Curative       Radiation treatment dates:  08/17/23 through 09/29/23   Site/dose:    Site: Left lung  Dose: 60 Gy delivered in 30 Fx at 2.00 Gy/Fx Technique: 3D  Narrative: The patient tolerated radiation treatment relatively well. During his final weekly treatment check on 11/06, the patient endorsed fatigue, some skin darkening, cough, and a sore throat which he was given a prescription of carafate for.   Plan: The patient has completed radiation treatment. The patient will return to radiation oncology clinic for routine followup in one month. I advised them to call or return sooner if they have any questions or concerns related to their recovery or treatment.  -----------------------------------  Billie Lade, PhD, MD  This document serves as a record of services personally performed by Antony Blackbird, MD. It was created on his behalf by Neena Rhymes, a trained medical scribe. The creation of this record is based on the scribe's personal observations and the provider's statements to them. This document has been checked and approved by the attending provider.

## 2023-11-03 ENCOUNTER — Ambulatory Visit
Admission: RE | Admit: 2023-11-03 | Discharge: 2023-11-03 | Disposition: A | Discharge status: Home / Self Care | Payer: 59 | Source: Ambulatory Visit | Attending: Radiation Oncology | Admitting: Radiation Oncology

## 2023-11-03 ENCOUNTER — Encounter: Payer: Self-pay | Admitting: Radiation Oncology

## 2023-11-03 VITALS — BP 127/79 | HR 70 | Temp 97.7°F | Resp 20 | Ht 67.0 in | Wt 164.8 lb

## 2023-11-03 DIAGNOSIS — Z923 Personal history of irradiation: Secondary | ICD-10-CM | POA: Insufficient documentation

## 2023-11-03 DIAGNOSIS — C3412 Malignant neoplasm of upper lobe, left bronchus or lung: Secondary | ICD-10-CM | POA: Insufficient documentation

## 2023-11-03 HISTORY — DX: Personal history of irradiation: Z92.3

## 2023-11-03 NOTE — Progress Notes (Signed)
[name] is here today for follow up post radiation to the lung.  Lung Side: left,patient completed treatment on 09/29/23.  Does the patient complain of any of the following: Pain: Reports intermittent pain to left chest with dizziness.  Shortness of breath w/wo exertion:  Yes mostly on exertion Cough: Yes,  Hemoptysis: No Pain with swallowing: Yes Swallowing/choking concerns: No  Appetite: good Energy Level: Low Post radiation skin Changes: Mild hyperpigmentation to left back.     Additional comments if applicable:  BP 127/79 (BP Location: Right Arm, Patient Position: Sitting, Cuff Size: Normal)   Pulse 70   Temp 97.7 F (36.5 C)   Resp 20   Ht 5\' 7"  (1.702 m)   Wt 164 lb 12.8 oz (74.8 kg)   SpO2 100%   BMI 25.81 kg/m

## 2023-11-07 ENCOUNTER — Telehealth: Payer: Self-pay | Admitting: Cardiology

## 2023-11-07 ENCOUNTER — Telehealth: Payer: Self-pay | Admitting: Internal Medicine

## 2023-11-07 NOTE — Telephone Encounter (Signed)
*  STAT* If patient is at the pharmacy, call can be transferred to refill team.   1. Which medications need to be refilled? (please list name of each medication and dose if known)   metoprolol succinate (TOPROL-XL) 100 MG 24 hr tablet   2. Which pharmacy/location (including street and city if local pharmacy) is medication to be sent to? [id number] [place], [place] - [id number] AT [facility] Phone: [phone]  Fax: [phone]      3. Do they need a 30 day or 90 day supply? 90

## 2023-11-07 NOTE — Telephone Encounter (Signed)
Pt's medication was already sent to pt's pharmacy on 10/25/23 with a year supply as requested. Confirmation received.

## 2023-11-07 NOTE — Telephone Encounter (Signed)
Patient is aware of rescheduled appointment times/dates 

## 2023-11-08 ENCOUNTER — Other Ambulatory Visit: Payer: Self-pay | Admitting: Cardiovascular Disease

## 2023-11-21 ENCOUNTER — Inpatient Hospital Stay: Payer: 59 | Admitting: Internal Medicine

## 2023-11-21 ENCOUNTER — Inpatient Hospital Stay: Payer: 59

## 2023-11-21 ENCOUNTER — Inpatient Hospital Stay (HOSPITAL_BASED_OUTPATIENT_CLINIC_OR_DEPARTMENT_OTHER): Payer: 59 | Admitting: Internal Medicine

## 2023-11-21 VITALS — BP 141/66 | HR 72 | Temp 98.1°F | Resp 15 | Ht 67.0 in | Wt 162.7 lb

## 2023-11-21 DIAGNOSIS — C3490 Malignant neoplasm of unspecified part of unspecified bronchus or lung: Secondary | ICD-10-CM

## 2023-11-21 DIAGNOSIS — I2510 Atherosclerotic heart disease of native coronary artery without angina pectoris: Secondary | ICD-10-CM | POA: Diagnosis not present

## 2023-11-21 DIAGNOSIS — Z7901 Long term (current) use of anticoagulants: Secondary | ICD-10-CM | POA: Diagnosis not present

## 2023-11-21 DIAGNOSIS — Z87442 Personal history of urinary calculi: Secondary | ICD-10-CM | POA: Diagnosis not present

## 2023-11-21 DIAGNOSIS — R42 Dizziness and giddiness: Secondary | ICD-10-CM | POA: Diagnosis not present

## 2023-11-21 DIAGNOSIS — C3412 Malignant neoplasm of upper lobe, left bronchus or lung: Secondary | ICD-10-CM | POA: Diagnosis not present

## 2023-11-21 DIAGNOSIS — R0609 Other forms of dyspnea: Secondary | ICD-10-CM | POA: Diagnosis not present

## 2023-11-21 DIAGNOSIS — I252 Old myocardial infarction: Secondary | ICD-10-CM | POA: Diagnosis not present

## 2023-11-21 DIAGNOSIS — R0602 Shortness of breath: Secondary | ICD-10-CM | POA: Diagnosis not present

## 2023-11-21 DIAGNOSIS — Z860102 Personal history of hyperplastic colon polyps: Secondary | ICD-10-CM | POA: Diagnosis not present

## 2023-11-21 DIAGNOSIS — R5383 Other fatigue: Secondary | ICD-10-CM | POA: Diagnosis not present

## 2023-11-21 DIAGNOSIS — R590 Localized enlarged lymph nodes: Secondary | ICD-10-CM | POA: Diagnosis not present

## 2023-11-21 DIAGNOSIS — R0789 Other chest pain: Secondary | ICD-10-CM | POA: Diagnosis not present

## 2023-11-21 DIAGNOSIS — Z79899 Other long term (current) drug therapy: Secondary | ICD-10-CM | POA: Diagnosis not present

## 2023-11-21 DIAGNOSIS — Z9221 Personal history of antineoplastic chemotherapy: Secondary | ICD-10-CM | POA: Diagnosis not present

## 2023-11-21 DIAGNOSIS — Z923 Personal history of irradiation: Secondary | ICD-10-CM | POA: Diagnosis not present

## 2023-11-21 LAB — CBC WITH DIFFERENTIAL (CANCER CENTER ONLY)
Abs Immature Granulocytes: 0.02 10*3/uL (ref 0.00–0.07)
Basophils Absolute: 0 10*3/uL (ref 0.0–0.1)
Basophils Relative: 1 %
Eosinophils Absolute: 0.2 10*3/uL (ref 0.0–0.5)
Eosinophils Relative: 5 %
HCT: 34 % — ABNORMAL LOW (ref 39.0–52.0)
Hemoglobin: 11.6 g/dL — ABNORMAL LOW (ref 13.0–17.0)
Immature Granulocytes: 1 %
Lymphocytes Relative: 18 %
Lymphs Abs: 0.8 10*3/uL (ref 0.7–4.0)
MCH: 31.9 pg (ref 26.0–34.0)
MCHC: 34.1 g/dL (ref 30.0–36.0)
MCV: 93.4 fL (ref 80.0–100.0)
Monocytes Absolute: 0.5 10*3/uL (ref 0.1–1.0)
Monocytes Relative: 12 %
Neutro Abs: 2.8 10*3/uL (ref 1.7–7.7)
Neutrophils Relative %: 63 %
Platelet Count: 140 10*3/uL — ABNORMAL LOW (ref 150–400)
RBC: 3.64 MIL/uL — ABNORMAL LOW (ref 4.22–5.81)
RDW: 14.6 % (ref 11.5–15.5)
WBC Count: 4.3 10*3/uL (ref 4.0–10.5)
nRBC: 0 % (ref 0.0–0.2)

## 2023-11-21 LAB — CMP (CANCER CENTER ONLY)
ALT: 10 U/L (ref 0–44)
AST: 14 U/L — ABNORMAL LOW (ref 15–41)
Albumin: 3.8 g/dL (ref 3.5–5.0)
Alkaline Phosphatase: 45 U/L (ref 38–126)
Anion gap: 5 (ref 5–15)
BUN: 15 mg/dL (ref 8–23)
CO2: 27 mmol/L (ref 22–32)
Calcium: 9.8 mg/dL (ref 8.9–10.3)
Chloride: 108 mmol/L (ref 98–111)
Creatinine: 1.41 mg/dL — ABNORMAL HIGH (ref 0.61–1.24)
GFR, Estimated: 49 mL/min — ABNORMAL LOW (ref >60)
Glucose, Bld: 118 mg/dL — ABNORMAL HIGH (ref 70–99)
Potassium: 4.3 mmol/L (ref 3.5–5.1)
Sodium: 140 mmol/L (ref 135–145)
Total Bilirubin: 1.2 mg/dL — ABNORMAL HIGH (ref <1.2)
Total Protein: 7.5 g/dL (ref 6.5–8.1)

## 2023-11-21 NOTE — Progress Notes (Signed)
[facility] [phone]   [phone]  OFFICE PROGRESS NOTE  [name], MD [address] [place] [address]  DIAGNOSIS: Stage IIIC/IV (T3, N3, M0) non-small cell lung cancer, adenocarcinoma. He presented with a left upper lobe spiculated mass in addition to lingular lesion and suspicious mediastinal lymphadenopathy He also has bilateral hypermetabolic axillary lymph nodes which could be related to metastatic disease versus inflammatory as the patient did have vaccines in both of his arms the week prior to his PET scan.    Molecular Studies: His PD-L1 expression was 1% and he has no actionable mutations.   PRIOR THERAPY: Concurrent chemoradiation with carboplatin for an AUC of 2 and paclitaxel 45 mg/m.  First dose expected on 08/15/23.  Status post 5 cycles of first cycle was given with carboplatin and paclitaxel and starting from cycle #2 he was on carboplatin and Abraxane secondary to hypersensitivity reaction to paclitaxel.   CURRENT THERAPY: Consolidation immunotherapy with Imfinzi 1500 Mg IV every 4 weeks.  First dose November 28, 2023  INTERVAL HISTORY: [name] 82 y.o. male returns to the clinic today for follow-up visit. Discussed the use of AI scribe software for clinical note transcription with the patient, who gave verbal consent to proceed.  History of Present Illness   The patient, an 82 year old individual with a diagnosis of stage 3C non-small cell lung cancer (NSCLC), last received treatment with weekly Abraxane and carboplatin in November 2024. Since then, he has not had a follow-up consultation to discuss the results of a subsequent chest scan performed later that month.  The patient reports feeling generally well, with the primary complaint being fatigue. He also mentions occasional chest pain, which resolves spontaneously, and shortness of breath upon exertion, leading to episodes of dizziness. However, he denies  experiencing any nausea, vomiting, or diarrhea.  Despite the patient's belief that his cancer may have resolved, the scan from November 2024 indicates that the solid nodule in the left lung, measuring 2.6, remains unchanged. He is aware of the potential side effects, including skin rash, diarrhea, and inflammation in the lung, kidney, liver, or thyroid.      MEDICAL HISTORY: Past Medical History:  Diagnosis Date   AICD (automatic cardioverter/defibrillator) present    CAD (coronary artery disease) 80% stenosis diag of the LAD, 30% in OM2 branch of LCX in 2009    a. Nonobstructive CAD by cath 11/2011 with the exception of the pre-existing diagonal branch #2 lesion.   Chronic systolic CHF (congestive heart failure) (HCC)    CKD (chronic kidney disease) stage 3, GFR 30-59 ml/min (HCC)    Colon polyp, hyperplastic    History of kidney stones    History of radiation therapy    Left Lung- 08/17/23-09/29/23- Dr. Antony Blackbird   History of stress test 06/01/2012   Normal myocardial perfusion study. compared to the previous study there is no significant change. this is a low risk scan   Hypertension    Legally blind    "both eyes"   Myocardial infarction [facility]) 11/22/2011   NICM (nonischemic cardiomyopathy) (HCC)    a. Remote hx of dilated NICM with EF ranging 20-45%, including normal EF by echo (55-60%) in 2014.   Peripheral arterial disease (HCC)    a. 06/2014: ABI right 0.99, left 1.2, LE dopplers revealing an occluded right posterior tibial. As symptoms were not felt r/t claudication, no further w/u at the time.   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 Requiring discontinuation of BB dose.   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  MEDICATIONS:  Current Outpatient  Medications  Medication Sig Dispense Refill   albuterol (VENTOLIN HFA) 108 (90 Base) MCG/ACT inhaler Inhale 1-2 puffs into the lungs every 6 (six) hours as needed for wheezing or shortness of breath.     apixaban (ELIQUIS) 2.5 MG TABS tablet Take 1 tablet (2.5 mg total) by mouth 2 (two) times daily. Okay to restart this medication on 07/06/2023     BREO ELLIPTA 100-25 MCG/INH AEPB Inhale 1 puff into the lungs every morning.     brimonidine (ALPHAGAN) 0.2 % ophthalmic solution Place 1 drop into both eyes 2 (two) times daily.     cycloSPORINE (RESTASIS) 0.05 % ophthalmic emulsion Place 1 drop into both eyes 2 (two) times daily.     diclofenac Sodium (VOLTAREN) 1 % GEL Apply 2 g topically 4 (four) times daily. (Patient not taking: Reported on 11/03/2023) 100 g 0   dorzolamide-timolol (COSOPT) 22.3-6.8 MG/ML ophthalmic solution Place 1 drop into both eyes 2 (two) times daily.     ENTRESTO 24-26 MG TAKE 1 TABLET BY MOUTH TWICE DAILY 180 tablet 3   furosemide (LASIX) 20 MG tablet Take 1 tablet (20 mg total) by mouth See admin instructions. Take 20 mg by mouth EVERY OTHER MORNING     isosorbide mononitrate (IMDUR) 30 MG 24 hr tablet Take 30 mg by mouth daily.     metoprolol succinate (TOPROL-XL) 100 MG 24 hr tablet TAKE 1 TABLET(100 MG) BY MOUTH DAILY WITH OR IMMEDIATELY FOLLOWING A MEAL 90 tablet 3   nitroGLYCERIN (NITROSTAT) 0.4 MG SL tablet Place 1 tablet (0.4 mg total) under the tongue every 5 (five) minutes as needed for chest pain. 25 tablet 1   prochlorperazine (COMPAZINE) 10 MG tablet Take 1 tablet (10 mg total) by mouth every 6 (six) hours as needed. (Patient not taking: Reported on 11/03/2023) 30 tablet 2   ROCKLATAN 0.02-0.005 % SOLN Apply 1 drop to eye at bedtime.     spironolactone (ALDACTONE) 25 MG tablet Take 0.5 tablets (12.5 mg total) by mouth daily. 90 tablet 2   sucralfate (CARAFATE) 1 g tablet Crush and dissolve in 4 oz of warm water prior to swallowing. Can be taken 3 times daily with  meals and at bedtime. 120 tablet 0   No current facility-administered medications for this visit.    SURGICAL HISTORY:  Past Surgical History:  Procedure Laterality Date   BACK SURGERY     BIV UPGRADE N/A 11/08/2019   Procedure: BIV UPGRADE;  Surgeon: Marinus Maw, MD;  [facility]: [facility] INVASIVE CV LAB;  Service: Cardiovascular;  Laterality: N/A;   BRONCHIAL BIOPSY  07/05/2023   Procedure: BRONCHIAL BIOPSIES;  Surgeon: Leslye Peer, MD;  Location: [facility] ENDOSCOPY;  Service: Pulmonary;;   BRONCHIAL BRUSHINGS  07/05/2023   Procedure: BRONCHIAL BRUSHINGS;  Surgeon: Leslye Peer, MD;  Location: [facility] ENDOSCOPY;  Service: Pulmonary;;   BRONCHIAL NEEDLE ASPIRATION BIOPSY  07/05/2023   Procedure: BRONCHIAL NEEDLE ASPIRATION BIOPSIES;  Surgeon: Leslye Peer, MD;  Location: [facility] ENDOSCOPY;  Service: Pulmonary;;   BRONCHIAL WASHINGS  07/05/2023   Procedure: BRONCHIAL WASHINGS;  Surgeon: Leslye Peer, MD;  Location: [facility] ENDOSCOPY;  Service: Pulmonary;;   CARDIAC CATHETERIZATION  11/2011   CARDIAC CATHETERIZATION  11/2011   didn't demonstrate high grade obstructive disease to account for his LV dysfunction.   CATARACT EXTRACTION, BILATERAL  1990's   CYSTOSCOPY     CYSTOSCOPY WITH BIOPSY Bilateral 08/03/2021   Procedure: CYSTOSCOPY WITH BLADDER BIOPSY, BILATERAL RETROGRADE PYELOGRAM;  Surgeon: Crista Elliot, MD;  Location: [facility] ORS;  Service: Urology;  Laterality: Bilateral;  REQUESTING 45 MINS   CYSTOSCOPY WITH URETHRAL DILATATION N/A 05/04/2013   Procedure: CYSTOSCOPY WITH URETHRAL DILATATION;  Surgeon: Tia Alert, MD;  Location: [facility] NEURO ORS;  Service: Neurosurgery;  Laterality: N/A;  with insertion of foley catheter   EP study and ablation of VT  7/13   PVC focus mapped to the right coronary cusp of the aorta, limited ablation performed due to proximity of the focus to the right coronary artery   EYE SURGERY     FINE NEEDLE ASPIRATION  07/05/2023   Procedure: FINE NEEDLE ASPIRATION (FNA)  LINEAR;  Surgeon: Leslye Peer, MD;  Location: [facility] ENDOSCOPY;  Service: Pulmonary;;   LEFT HEART CATH AND CORONARY ANGIOGRAPHY N/A 11/07/2019   Procedure: LEFT HEART CATH AND CORONARY ANGIOGRAPHY;  Surgeon: Iran Ouch, MD;  Location: [facility] CV LAB;  Service: Cardiovascular;  Laterality: N/A;   LEFT HEART CATHETERIZATION WITH CORONARY ANGIOGRAM N/A 11/25/2011   Procedure: LEFT HEART CATHETERIZATION WITH CORONARY ANGIOGRAM;  Surgeon: Marykay Lex, MD;  Location: [facility] CATH LAB;  Service: Cardiovascular;  Laterality: N/A;   PACEMAKER IMPLANT N/A 07/12/2018   Procedure: PACEMAKER IMPLANT;  Surgeon: Thurmon Fair, MD;  Location: [facility]VAS[facility] CV LAB;  Service: Cardiovascular;  Laterality: N/A;   POSTERIOR FUSION LUMBAR SPINE  1979   ROTATOR CUFF REPAIR  2000's   left   V-TACH ABLATION N/A 06/06/2012   Procedure: V-TACH ABLATION;  Surgeon: Hillis Range, MD;  Location: [facility] CATH LAB;  Service: Cardiovascular;  Laterality: N/A;   VIDEO BRONCHOSCOPY WITH ENDOBRONCHIAL ULTRASOUND N/A 07/05/2023   Procedure: VIDEO BRONCHOSCOPY WITH ENDOBRONCHIAL ULTRASOUND;  Surgeon: Leslye Peer, MD;  Location: [facility] ENDOSCOPY;  Service: Pulmonary;  Laterality: N/A;    REVIEW OF SYSTEMS:  Constitutional: positive for fatigue Eyes: negative Ears, nose, mouth, throat, and face: negative Respiratory: positive for dyspnea on exertion Cardiovascular: negative Gastrointestinal: negative Genitourinary:negative Integument/breast: negative Hematologic/lymphatic: negative Musculoskeletal:negative Neurological: negative Behavioral/Psych: negative Endocrine: negative Allergic/Immunologic: negative   PHYSICAL EXAMINATION: General appearance: alert, cooperative, fatigued, and no distress Head: Normocephalic, without obvious abnormality, atraumatic Neck: no adenopathy, no JVD, supple, symmetrical, trachea midline, and thyroid not enlarged, symmetric, no tenderness/mass/nodules Lymph nodes: Cervical, supraclavicular,  and axillary nodes normal. Resp: clear to auscultation bilaterally Back: symmetric, no curvature. ROM normal. No CVA tenderness. Cardio: regular rate and rhythm, S1, S2 normal, no murmur, click, rub or gallop GI: soft, non-tender; bowel sounds normal; no masses,  no organomegaly Extremities: extremities normal, atraumatic, no cyanosis or edema Neurologic: Alert and oriented X 3, normal strength and tone. Normal symmetric reflexes. Normal coordination and gait  ECOG PERFORMANCE STATUS: 1 - Symptomatic but completely ambulatory  Blood pressure (!) 141/66, pulse 72, temperature 98.1 F (36.7 C), temperature source Temporal, resp. rate 15, height 5\' 7"  (1.702 m), weight 162 lb 11.2 oz (73.8 kg), SpO2 96%.  LABORATORY DATA: Lab Results  Component Value Date   WBC 4.3 11/21/2023   HGB 11.6 (L) 11/21/2023   HCT 34.0 (L) 11/21/2023   MCV 93.4 11/21/2023   PLT 140 (L) 11/21/2023      Chemistry  Component Value Date/Time   NA 140 11/21/2023 0922   NA 142 11/11/2022 1000   K 4.3 11/21/2023 0922   CL 108 11/21/2023 0922   CO2 27 11/21/2023 0922   BUN 15 11/21/2023 0922   BUN 18 11/11/2022 1000   CREATININE 1.41 (H) 11/21/2023 0922   CREATININE 1.37 (H) 01/24/2017 0950      Component Value Date/Time   CALCIUM 9.8 11/21/2023 0922   ALKPHOS 45 11/21/2023 0922   AST 14 (L) 11/21/2023 0922   ALT 10 11/21/2023 0922   BILITOT 1.2 (H) 11/21/2023 0922       RADIOGRAPHIC STUDIES: No results found.  ASSESSMENT AND PLAN: This is a very pleasant 82 years old African-American male with Stage IIIC/IV (T3, N3, M0) non-small cell lung cancer, adenocarcinoma. He presented with a left upper lobe spiculated mass in addition to lingular lesion and suspicious mediastinal lymphadenopathy. He started a course of concurrent chemoradiation initially with carboplatin for AUC of 2 and paclitaxel 45 Mg/M2 but paclitaxel was the changed to Abraxane starting from cycle #2 secondary to hypersensitivity  reaction.  The patient received 5 cycles of concurrent chemoradiation.  He tolerated this treatment fairly well except for fatigue. He had repeat CT scan of the chest performed on October 18, 2023 that showed no evidence for disease progression. I personally independently reviewed the scan and discussed the result with the patient today.  I discussed with the patient the option of continuous observation and monitoring versus consolidation treatment with immunotherapy with Imfinzi 1500 Mg IV every 4 weeks.     Stage III C Non-Small Cell Lung Cancer Diagnosed in September 2024. Completed chemotherapy and radiation with weekly Abraxane and carboplatin, last session on September 26, 2023. October 18, 2023, chest scan showed no tumor growth, indicating a positive response. Reports intermittent chest pain, exertional dyspnea, dizziness, and fatigue. No gastrointestinal symptoms. Appetite remains good. Discussed and agreed on initiating durvalumab (Imfinzi) to slow cancer progression. - Initiate durvalumab (Imfinzi) infusion every four weeks for one year - Schedule chest scans every three months to monitor progress - Provide scan results - Schedule first infusion next week  General Health Maintenance No specific issues discussed.   The patient was advised to call immediately if he has any other concerning symptoms in the interval. The patient voices understanding of current disease status and treatment options and is in agreement with the current care plan.  All questions were answered. The patient knows to call the clinic with any problems, questions or concerns. We can certainly see the patient much sooner if necessary.  The total time spent in the appointment was 30 minutes.  Disclaimer: This note was dictated with voice recognition software. Similar sounding words can inadvertently be transcribed and may not be corrected upon review.

## 2023-11-21 NOTE — Progress Notes (Signed)
 DISCONTINUE OFF PATHWAY REGIMEN - Non-Small Cell Lung   [id number]:Carboplatin AUC=2 IV D1 + Paclitaxel 45 mg/m2 IV D1 q7 Days + RT:   A cycle is every 7 days, concurrent with RT:     Paclitaxel      Carboplatin   **Always confirm dose/schedule in your pharmacy ordering system**  PRIOR TREATMENT: Off Pathway: Carboplatin AUC=2 IV D1 + Paclitaxel 45 mg/m2 IV D1 q7 Days + RT  START ON PATHWAY REGIMEN - Non-Small Cell Lung     A cycle is every 28 days:     Durvalumab   **Always confirm dose/schedule in your pharmacy ordering system**  Patient Characteristics: Preoperative or Nonsurgical Candidate (Clinical Staging), Stage III - Nonsurgical Candidate (Nonsquamous and Squamous), PS = 0,1, EGFR Mutation - Common (Exon 19 Deletion or Exon 21 L858R Substitution) Negative Therapeutic Status: Preoperative or Nonsurgical Candidate (Clinical Staging) AJCC T Category: cT3 AJCC N Category: cN3 AJCC M Category: cM0 AJCC 8 Stage Grouping: IIIC ECOG Performance Status: 1 EGFR Mutation - Common (Exon 19 Deletion or Exon 21 L858R Substitution): Negative Intent of Therapy: Curative Intent, Discussed with Patient

## 2023-11-24 NOTE — Progress Notes (Signed)
 Pharmacist Chemotherapy Monitoring - Initial Assessment    Anticipated start date: 12/01/23   The following has been reviewed per standard work regarding the patient's treatment regimen: The patient's diagnosis, treatment plan and drug doses, and organ/hematologic function Lab orders and baseline tests specific to treatment regimen  The treatment plan start date, drug sequencing, and pre-medications Prior authorization status  Patient's documented medication list, including drug-drug interaction screen and prescriptions for anti-emetics and supportive care specific to the treatment regimen The drug concentrations, fluid compatibility, administration routes, and timing of the medications to be used The patient's access for treatment and lifetime cumulative dose history, if applicable  The patient's medication allergies and previous infusion related reactions, if applicable   Changes made to treatment plan:  N/A  Follow up needed:  Pending authorization for treatment    [name], PharmD, MBA

## 2023-11-25 ENCOUNTER — Other Ambulatory Visit: Payer: Self-pay | Admitting: Internal Medicine

## 2023-12-01 ENCOUNTER — Inpatient Hospital Stay: Payer: 59

## 2023-12-01 ENCOUNTER — Inpatient Hospital Stay: Payer: 59 | Attending: Internal Medicine

## 2023-12-01 VITALS — BP 131/80 | HR 70 | Temp 98.2°F | Resp 16

## 2023-12-01 DIAGNOSIS — C3412 Malignant neoplasm of upper lobe, left bronchus or lung: Secondary | ICD-10-CM | POA: Insufficient documentation

## 2023-12-01 DIAGNOSIS — Z79620 Long term (current) use of immunosuppressive biologic: Secondary | ICD-10-CM | POA: Diagnosis not present

## 2023-12-01 DIAGNOSIS — Z5112 Encounter for antineoplastic immunotherapy: Secondary | ICD-10-CM | POA: Diagnosis not present

## 2023-12-01 LAB — CMP (CANCER CENTER ONLY)
ALT: 8 U/L (ref 0–44)
AST: 13 U/L — ABNORMAL LOW (ref 15–41)
Albumin: 3.7 g/dL (ref 3.5–5.0)
Alkaline Phosphatase: 51 U/L (ref 38–126)
Anion gap: 6 (ref 5–15)
BUN: 18 mg/dL (ref 8–23)
CO2: 27 mmol/L (ref 22–32)
Calcium: 9.6 mg/dL (ref 8.9–10.3)
Chloride: 107 mmol/L (ref 98–111)
Creatinine: 1.38 mg/dL — ABNORMAL HIGH (ref 0.61–1.24)
GFR, Estimated: 50 mL/min — ABNORMAL LOW (ref >60)
Glucose, Bld: 124 mg/dL — ABNORMAL HIGH (ref 70–99)
Potassium: 4.2 mmol/L (ref 3.5–5.1)
Sodium: 140 mmol/L (ref 135–145)
Total Bilirubin: 0.8 mg/dL (ref 0.0–1.2)
Total Protein: 7.2 g/dL (ref 6.5–8.1)

## 2023-12-01 LAB — CBC WITH DIFFERENTIAL (CANCER CENTER ONLY)
Abs Immature Granulocytes: 0.02 10*3/uL (ref 0.00–0.07)
Basophils Absolute: 0 10*3/uL (ref 0.0–0.1)
Basophils Relative: 1 %
Eosinophils Absolute: 0.3 10*3/uL (ref 0.0–0.5)
Eosinophils Relative: 8 %
HCT: 33.1 % — ABNORMAL LOW (ref 39.0–52.0)
Hemoglobin: 11 g/dL — ABNORMAL LOW (ref 13.0–17.0)
Immature Granulocytes: 1 %
Lymphocytes Relative: 19 %
Lymphs Abs: 0.8 10*3/uL (ref 0.7–4.0)
MCH: 31.3 pg (ref 26.0–34.0)
MCHC: 33.2 g/dL (ref 30.0–36.0)
MCV: 94.3 fL (ref 80.0–100.0)
Monocytes Absolute: 0.5 10*3/uL (ref 0.1–1.0)
Monocytes Relative: 12 %
Neutro Abs: 2.4 10*3/uL (ref 1.7–7.7)
Neutrophils Relative %: 59 %
Platelet Count: 127 10*3/uL — ABNORMAL LOW (ref 150–400)
RBC: 3.51 MIL/uL — ABNORMAL LOW (ref 4.22–5.81)
RDW: 13.7 % (ref 11.5–15.5)
WBC Count: 4 10*3/uL (ref 4.0–10.5)
nRBC: 0 % (ref 0.0–0.2)

## 2023-12-01 LAB — TSH: TSH: 1.801 u[IU]/mL (ref 0.350–4.500)

## 2023-12-01 MED ORDER — DURVALUMAB 500 MG/10ML IV SOLN
1500.0000 mg | Freq: Once | INTRAVENOUS | Status: AC
  Administered 2023-12-01: 1500 mg via INTRAVENOUS
  Filled 2023-12-01: qty 30

## 2023-12-01 MED ORDER — SODIUM CHLORIDE 0.9 % IV SOLN: INTRAVENOUS | Status: DC

## 2023-12-01 NOTE — Patient Instructions (Signed)
 [facility] - A DEPT OF [facility]  Discharge Instructions: Thank you for choosing [facility] to provide your oncology and hematology care.   If you have a lab appointment with the Cancer Center, please go directly to the Cancer Center and check in at the registration area.   Wear comfortable clothing and clothing appropriate for easy access to any Portacath or PICC line.   We strive to give you quality time with your provider. You may need to reschedule your appointment if you arrive late (15 or more minutes).  Arriving late affects you and other patients whose appointments are after yours.  Also, if you miss three or more appointments without notifying the office, you may be dismissed from the clinic at the provider's discretion.      For prescription refill requests, have your pharmacy contact our office and allow 72 hours for refills to be completed.    Today you received the following chemotherapy and/or immunotherapy agents: durvalumab       To help prevent nausea and vomiting after your treatment, we encourage you to take your nausea medication as directed.  BELOW ARE SYMPTOMS THAT SHOULD BE REPORTED IMMEDIATELY: *FEVER GREATER THAN 100.4 F (38 C) OR HIGHER *CHILLS OR SWEATING *NAUSEA AND VOMITING THAT IS NOT CONTROLLED WITH YOUR NAUSEA MEDICATION *UNUSUAL SHORTNESS OF BREATH *UNUSUAL BRUISING OR BLEEDING *URINARY PROBLEMS (pain or burning when urinating, or frequent urination) *BOWEL PROBLEMS (unusual diarrhea, constipation, pain near the anus) TENDERNESS IN MOUTH AND THROAT WITH OR WITHOUT PRESENCE OF ULCERS (sore throat, sores in mouth, or a toothache) UNUSUAL RASH, SWELLING OR PAIN  UNUSUAL VAGINAL DISCHARGE OR ITCHING   Items with * indicate a potential emergency and should be followed up as soon as possible or go to the Emergency Department if any problems should occur.  Please show the CHEMOTHERAPY ALERT CARD or IMMUNOTHERAPY  ALERT CARD at check-in to the Emergency Department and triage nurse.  Should you have questions after your visit or need to cancel or reschedule your appointment, please contact [facility] - A DEPT OF [facility]  Dept: [phone]  and follow the prompts.  Office hours are 8:00 a.m. to 4:30 p.m. Monday - Friday. Please note that voicemails left after 4:00 p.m. may not be returned until the following business day.  We are closed weekends and major holidays. You have access to a nurse at all times for urgent questions. Please call the main number to the clinic Dept: [phone] and follow the prompts.   For any non-urgent questions, you may also contact your provider using MyChart. We now offer e-Visits for anyone 26 and older to request care online for non-urgent symptoms. For details visit [email or web address].   Also download the MyChart app! Go to the app store, search MyChart, open the app, select [place], and log in with your MyChart username and password.

## 2023-12-02 ENCOUNTER — Telehealth: Payer: Self-pay

## 2023-12-02 NOTE — Telephone Encounter (Signed)
-----   Message from Nurse Alycia Rossetti H sent at 12/01/2023 11:11 AM EST ----- Regarding: 1st Time Imfinzi Dr Asa Lente patient No issues during treatment. First Time Imfinzi. Please call patient's cell phone.

## 2023-12-02 NOTE — Telephone Encounter (Signed)
 [name] states that he is doing fine. He is eating, drinking, and urinating well. He knows to call the office at [phone] if he has any questions or concerns.

## 2023-12-03 LAB — T4: T4, Total: 7.7 ug/dL (ref 4.5–12.0)

## 2023-12-19 DIAGNOSIS — H42 Glaucoma in diseases classified elsewhere: Secondary | ICD-10-CM | POA: Diagnosis not present

## 2023-12-19 DIAGNOSIS — I129 Hypertensive chronic kidney disease with stage 1 through stage 4 chronic kidney disease, or unspecified chronic kidney disease: Secondary | ICD-10-CM | POA: Diagnosis not present

## 2023-12-19 DIAGNOSIS — I739 Peripheral vascular disease, unspecified: Secondary | ICD-10-CM | POA: Diagnosis not present

## 2023-12-19 DIAGNOSIS — E1169 Type 2 diabetes mellitus with other specified complication: Secondary | ICD-10-CM | POA: Diagnosis not present

## 2023-12-19 DIAGNOSIS — I10 Essential (primary) hypertension: Secondary | ICD-10-CM | POA: Diagnosis not present

## 2023-12-19 DIAGNOSIS — I5042 Chronic combined systolic (congestive) and diastolic (congestive) heart failure: Secondary | ICD-10-CM | POA: Diagnosis not present

## 2023-12-19 DIAGNOSIS — N189 Chronic kidney disease, unspecified: Secondary | ICD-10-CM | POA: Diagnosis not present

## 2023-12-19 DIAGNOSIS — E1122 Type 2 diabetes mellitus with diabetic chronic kidney disease: Secondary | ICD-10-CM | POA: Diagnosis not present

## 2023-12-19 DIAGNOSIS — C3490 Malignant neoplasm of unspecified part of unspecified bronchus or lung: Secondary | ICD-10-CM | POA: Diagnosis not present

## 2023-12-19 DIAGNOSIS — I130 Hypertensive heart and chronic kidney disease with heart failure and stage 1 through stage 4 chronic kidney disease, or unspecified chronic kidney disease: Secondary | ICD-10-CM | POA: Diagnosis not present

## 2023-12-28 ENCOUNTER — Inpatient Hospital Stay: Payer: 59 | Attending: Internal Medicine

## 2023-12-28 ENCOUNTER — Inpatient Hospital Stay (HOSPITAL_BASED_OUTPATIENT_CLINIC_OR_DEPARTMENT_OTHER): Payer: 59 | Admitting: Internal Medicine

## 2023-12-28 ENCOUNTER — Inpatient Hospital Stay: Payer: 59 | Admitting: Dietician

## 2023-12-28 VITALS — BP 147/66 | HR 71 | Temp 97.2°F | Resp 16 | Ht 67.0 in | Wt 163.3 lb

## 2023-12-28 DIAGNOSIS — Z860102 Personal history of hyperplastic colon polyps: Secondary | ICD-10-CM | POA: Insufficient documentation

## 2023-12-28 DIAGNOSIS — I129 Hypertensive chronic kidney disease with stage 1 through stage 4 chronic kidney disease, or unspecified chronic kidney disease: Secondary | ICD-10-CM | POA: Diagnosis not present

## 2023-12-28 DIAGNOSIS — R42 Dizziness and giddiness: Secondary | ICD-10-CM | POA: Diagnosis not present

## 2023-12-28 DIAGNOSIS — Z5112 Encounter for antineoplastic immunotherapy: Secondary | ICD-10-CM | POA: Diagnosis not present

## 2023-12-28 DIAGNOSIS — Z7901 Long term (current) use of anticoagulants: Secondary | ICD-10-CM | POA: Diagnosis not present

## 2023-12-28 DIAGNOSIS — Z79899 Other long term (current) drug therapy: Secondary | ICD-10-CM | POA: Diagnosis not present

## 2023-12-28 DIAGNOSIS — L299 Pruritus, unspecified: Secondary | ICD-10-CM | POA: Diagnosis not present

## 2023-12-28 DIAGNOSIS — C3412 Malignant neoplasm of upper lobe, left bronchus or lung: Secondary | ICD-10-CM

## 2023-12-28 DIAGNOSIS — Z9221 Personal history of antineoplastic chemotherapy: Secondary | ICD-10-CM | POA: Insufficient documentation

## 2023-12-28 DIAGNOSIS — Z923 Personal history of irradiation: Secondary | ICD-10-CM | POA: Insufficient documentation

## 2023-12-28 DIAGNOSIS — Z79620 Long term (current) use of immunosuppressive biologic: Secondary | ICD-10-CM | POA: Insufficient documentation

## 2023-12-28 DIAGNOSIS — Z87442 Personal history of urinary calculi: Secondary | ICD-10-CM | POA: Insufficient documentation

## 2023-12-28 DIAGNOSIS — N1832 Chronic kidney disease, stage 3b: Secondary | ICD-10-CM | POA: Diagnosis not present

## 2023-12-28 DIAGNOSIS — I252 Old myocardial infarction: Secondary | ICD-10-CM | POA: Insufficient documentation

## 2023-12-28 LAB — CMP (CANCER CENTER ONLY)
ALT: 8 U/L (ref 0–44)
AST: 14 U/L — ABNORMAL LOW (ref 15–41)
Albumin: 3.7 g/dL (ref 3.5–5.0)
Alkaline Phosphatase: 52 U/L (ref 38–126)
Anion gap: 5 (ref 5–15)
BUN: 18 mg/dL (ref 8–23)
CO2: 26 mmol/L (ref 22–32)
Calcium: 9.7 mg/dL (ref 8.9–10.3)
Chloride: 110 mmol/L (ref 98–111)
Creatinine: 1.35 mg/dL — ABNORMAL HIGH (ref 0.61–1.24)
GFR, Estimated: 51 mL/min — ABNORMAL LOW (ref >60)
Glucose, Bld: 119 mg/dL — ABNORMAL HIGH (ref 70–99)
Potassium: 4 mmol/L (ref 3.5–5.1)
Sodium: 141 mmol/L (ref 135–145)
Total Bilirubin: 0.7 mg/dL (ref 0.0–1.2)
Total Protein: 7.2 g/dL (ref 6.5–8.1)

## 2023-12-28 LAB — CBC WITH DIFFERENTIAL (CANCER CENTER ONLY)
Abs Immature Granulocytes: 0.01 10*3/uL (ref 0.00–0.07)
Basophils Absolute: 0 10*3/uL (ref 0.0–0.1)
Basophils Relative: 1 %
Eosinophils Absolute: 0.2 10*3/uL (ref 0.0–0.5)
Eosinophils Relative: 5 %
HCT: 33.5 % — ABNORMAL LOW (ref 39.0–52.0)
Hemoglobin: 10.9 g/dL — ABNORMAL LOW (ref 13.0–17.0)
Immature Granulocytes: 0 %
Lymphocytes Relative: 17 %
Lymphs Abs: 0.7 10*3/uL (ref 0.7–4.0)
MCH: 30.7 pg (ref 26.0–34.0)
MCHC: 32.5 g/dL (ref 30.0–36.0)
MCV: 94.4 fL (ref 80.0–100.0)
Monocytes Absolute: 0.5 10*3/uL (ref 0.1–1.0)
Monocytes Relative: 11 %
Neutro Abs: 2.7 10*3/uL (ref 1.7–7.7)
Neutrophils Relative %: 66 %
Platelet Count: 109 10*3/uL — ABNORMAL LOW (ref 150–400)
RBC: 3.55 MIL/uL — ABNORMAL LOW (ref 4.22–5.81)
RDW: 12.8 % (ref 11.5–15.5)
WBC Count: 4.2 10*3/uL (ref 4.0–10.5)
nRBC: 0 % (ref 0.0–0.2)

## 2023-12-28 MED ORDER — SODIUM CHLORIDE 0.9 % IV SOLN: INTRAVENOUS | Status: DC

## 2023-12-28 MED ORDER — SODIUM CHLORIDE 0.9 % IV SOLN
1500.0000 mg | Freq: Once | INTRAVENOUS | Status: AC
  Administered 2023-12-28: 1500 mg via INTRAVENOUS
  Filled 2023-12-28: qty 30

## 2023-12-28 NOTE — Patient Instructions (Signed)
 [facility] - A DEPT OF [facility]  Discharge Instructions: Thank you for choosing [facility] to provide your oncology and hematology care.   If you have a lab appointment with the Cancer Center, please go directly to the Cancer Center and check in at the registration area.   Wear comfortable clothing and clothing appropriate for easy access to any Portacath or PICC line.   We strive to give you quality time with your provider. You may need to reschedule your appointment if you arrive late (15 or more minutes).  Arriving late affects you and other patients whose appointments are after yours.  Also, if you miss three or more appointments without notifying the office, you may be dismissed from the clinic at the provider's discretion.      For prescription refill requests, have your pharmacy contact our office and allow 72 hours for refills to be completed.    Today you received the following chemotherapy and/or immunotherapy agents: durvalumab       To help prevent nausea and vomiting after your treatment, we encourage you to take your nausea medication as directed.  BELOW ARE SYMPTOMS THAT SHOULD BE REPORTED IMMEDIATELY: *FEVER GREATER THAN 100.4 F (38 C) OR HIGHER *CHILLS OR SWEATING *NAUSEA AND VOMITING THAT IS NOT CONTROLLED WITH YOUR NAUSEA MEDICATION *UNUSUAL SHORTNESS OF BREATH *UNUSUAL BRUISING OR BLEEDING *URINARY PROBLEMS (pain or burning when urinating, or frequent urination) *BOWEL PROBLEMS (unusual diarrhea, constipation, pain near the anus) TENDERNESS IN MOUTH AND THROAT WITH OR WITHOUT PRESENCE OF ULCERS (sore throat, sores in mouth, or a toothache) UNUSUAL RASH, SWELLING OR PAIN  UNUSUAL VAGINAL DISCHARGE OR ITCHING   Items with * indicate a potential emergency and should be followed up as soon as possible or go to the Emergency Department if any problems should occur.  Please show the CHEMOTHERAPY ALERT CARD or IMMUNOTHERAPY  ALERT CARD at check-in to the Emergency Department and triage nurse.  Should you have questions after your visit or need to cancel or reschedule your appointment, please contact [facility] - A DEPT OF [facility]  Dept: [phone]  and follow the prompts.  Office hours are 8:00 a.m. to 4:30 p.m. Monday - Friday. Please note that voicemails left after 4:00 p.m. may not be returned until the following business day.  We are closed weekends and major holidays. You have access to a nurse at all times for urgent questions. Please call the main number to the clinic Dept: [phone] and follow the prompts.   For any non-urgent questions, you may also contact your provider using MyChart. We now offer e-Visits for anyone 26 and older to request care online for non-urgent symptoms. For details visit [email or web address].   Also download the MyChart app! Go to the app store, search MyChart, open the app, select [place], and log in with your MyChart username and password.

## 2023-12-28 NOTE — Progress Notes (Signed)
 [facility] [phone]   [phone]  OFFICE PROGRESS NOTE  [name], MD [address], [address] [place] [address]  DIAGNOSIS: Stage IIIC/IV (T3, N3, M0) non-small cell lung cancer, adenocarcinoma. He presented with a left upper lobe spiculated mass in addition to lingular lesion and suspicious mediastinal lymphadenopathy He also has bilateral hypermetabolic axillary lymph nodes which could be related to metastatic disease versus inflammatory as the patient did have vaccines in both of his arms the week prior to his PET scan.  This was diagnosed in August 2024.   Molecular Studies: His PD-L1 expression was 1% and he has no actionable mutations.   PRIOR THERAPY: Concurrent chemoradiation with carboplatin  for an AUC of 2 and paclitaxel  45 mg/m.  First dose expected on 08/15/23.  Status post 5 cycles of first cycle was given with carboplatin  and paclitaxel  and starting from cycle #2 he was on carboplatin  and Abraxane  secondary to hypersensitivity reaction to paclitaxel .   CURRENT THERAPY: Consolidation immunotherapy with Imfinzi  1500 Mg IV every 4 weeks.  First dose November 28, 2023  INTERVAL HISTORY: [name] 83 y.o. male returns to the clinic today for follow-up visit accompanied by his wife. Discussed the use of AI scribe software for clinical note transcription with the patient, who gave verbal consent to proceed.  History of Present Illness   [name] is an 83 year old male with stage IIIc/IV non-small cell lung cancer adenocarcinoma who presents for follow-up and continuation of immunotherapy.  He was diagnosed with stage IIIc/IV non-small cell lung cancer adenocarcinoma in August 2024. Following the diagnosis, he underwent chemotherapy and radiation, which he tolerated well.  After completing chemotherapy and radiation, he began consolidation immunotherapy with Imfinzi  (durvalumab ) every four weeks. He has completed one cycle of  this treatment.  He experiences dizziness, which he is unsure is related to the treatment. He denies nausea, vomiting, rash, diarrhea, fever, and chills. He reports itching.       MEDICAL HISTORY: Past Medical History:  Diagnosis Date   AICD (automatic cardioverter/defibrillator) present    CAD (coronary artery disease) 80% stenosis diag of the LAD, 30% in OM2 branch of LCX in 2009    a. Nonobstructive CAD by cath 11/2011 with the exception of the pre-existing diagonal branch #2 lesion.   Chronic systolic CHF (congestive heart failure) (HCC)    CKD (chronic kidney disease) stage 3, GFR 30-59 ml/min (HCC)    Colon polyp, hyperplastic    History of kidney stones    History of radiation therapy    Left Lung- 08/17/23-09/29/23- Dr. Lynwood Nasuti   History of stress test 06/01/2012   Normal myocardial perfusion study. compared to the previous study there is no significant change. this is a low risk scan   Hypertension    Legally blind    both eyes   Myocardial infarction [facility]) 11/22/2011   NICM (nonischemic cardiomyopathy) (HCC)    a. Remote hx of dilated NICM with EF ranging 20-45%, including normal EF by echo (55-60%) in 2014.   Peripheral arterial disease (HCC)    a. 06/2014: ABI right 0.99, left 1.2, LE dopplers revealing an occluded right posterior tibial. As symptoms were not felt r/t claudication, no further w/u at the time.   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 Requiring discontinuation of BB dose.   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  MEDICATIONS:  Current Outpatient Medications  Medication Sig Dispense Refill   albuterol  (VENTOLIN  HFA) 108 (90 Base) MCG/ACT inhaler Inhale 1-2 puffs into the lungs every 6 (six) hours as needed for wheezing or  shortness of breath.     apixaban  (ELIQUIS ) 2.5 MG TABS tablet Take 1 tablet (2.5 mg total) by mouth 2 (two) times daily. Okay to restart this medication on 07/06/2023     BREO ELLIPTA  100-25 MCG/INH AEPB Inhale 1 puff into the lungs every morning.     brimonidine  (ALPHAGAN ) 0.2 % ophthalmic solution Place 1 drop into both eyes 2 (two) times daily.     cycloSPORINE  (RESTASIS ) 0.05 % ophthalmic emulsion Place 1 drop into both eyes 2 (two) times daily.     diclofenac  Sodium (VOLTAREN ) 1 % GEL Apply 2 g topically 4 (four) times daily. (Patient not taking: Reported on 11/03/2023) 100 g 0   dorzolamide -timolol  (COSOPT ) 22.3-6.8 MG/ML ophthalmic solution Place 1 drop into both eyes 2 (two) times daily.     ENTRESTO  24-26 MG TAKE 1 TABLET BY MOUTH TWICE DAILY 180 tablet 3   furosemide  (LASIX ) 20 MG tablet Take 1 tablet (20 mg total) by mouth See admin instructions. Take 20 mg by mouth EVERY OTHER MORNING     isosorbide  mononitrate (IMDUR ) 30 MG 24 hr tablet Take 30 mg by mouth daily.     metoprolol  succinate (TOPROL -XL) 100 MG 24 hr tablet TAKE 1 TABLET(100 MG) BY MOUTH DAILY WITH OR IMMEDIATELY FOLLOWING A MEAL 90 tablet 3   nitroGLYCERIN  (NITROSTAT ) 0.4 MG SL tablet Place 1 tablet (0.4 mg total) under the tongue every 5 (five) minutes as needed for chest pain. 25 tablet 1   prochlorperazine  (COMPAZINE ) 10 MG tablet Take 1 tablet (10 mg total) by mouth every 6 (six) hours as needed. (Patient not taking: Reported on 11/03/2023) 30 tablet 2   ROCKLATAN  0.02-0.005 % SOLN Apply 1 drop to eye at bedtime.     spironolactone  (ALDACTONE ) 25 MG tablet Take 0.5 tablets (12.5 mg total) by mouth daily. 90 tablet 2   sucralfate  (CARAFATE ) 1 g tablet Crush and dissolve in 4 oz of warm water  prior to swallowing. Can be taken 3 times daily with meals and at bedtime. 120 tablet 0   No current facility-administered medications for this visit.    SURGICAL HISTORY:  Past Surgical History:  Procedure Laterality Date    BACK SURGERY     BIV UPGRADE N/A 11/08/2019   Procedure: BIV UPGRADE;  Surgeon: Waddell Danelle ORN, MD;  Location: [facility] INVASIVE CV LAB;  Service: Cardiovascular;  Laterality: N/A;   BRONCHIAL BIOPSY  07/05/2023   Procedure: BRONCHIAL BIOPSIES;  Surgeon: Shelah Lamar RAMAN, MD;  Location: [facility] ENDOSCOPY;  Service: Pulmonary;;   BRONCHIAL BRUSHINGS  07/05/2023   Procedure: BRONCHIAL BRUSHINGS;  Surgeon: Shelah Lamar RAMAN, MD;  Location: [facility] ENDOSCOPY;  Service: Pulmonary;;   BRONCHIAL NEEDLE ASPIRATION BIOPSY  07/05/2023   Procedure: BRONCHIAL NEEDLE ASPIRATION BIOPSIES;  Surgeon: Shelah Lamar RAMAN, MD;  Location: [facility] ENDOSCOPY;  Service: Pulmonary;;   BRONCHIAL WASHINGS  07/05/2023   Procedure: BRONCHIAL WASHINGS;  Surgeon: Shelah Lamar RAMAN, MD;  Location: [facility] ENDOSCOPY;  Service: Pulmonary;;   CARDIAC CATHETERIZATION  11/2011   CARDIAC CATHETERIZATION  11/2011   didn't demonstrate high grade obstructive disease to account for his LV dysfunction.   CATARACT EXTRACTION, BILATERAL  1990's   CYSTOSCOPY  CYSTOSCOPY WITH BIOPSY Bilateral 08/03/2021   Procedure: CYSTOSCOPY WITH BLADDER BIOPSY, BILATERAL RETROGRADE PYELOGRAM;  Surgeon: Carolee Sherwood JONETTA DOUGLAS, MD;  Location: [facility] ORS;  Service: Urology;  Laterality: Bilateral;  REQUESTING 45 MINS   CYSTOSCOPY WITH URETHRAL DILATATION N/A 05/04/2013   Procedure: CYSTOSCOPY WITH URETHRAL DILATATION;  Surgeon: Alm GORMAN Molt, MD;  Location: [facility] NEURO ORS;  Service: Neurosurgery;  Laterality: N/A;  with insertion of foley catheter   EP study and ablation of VT  7/13   PVC focus mapped to the right coronary cusp of the aorta, limited ablation performed due to proximity of the focus to the right coronary artery   EYE SURGERY     FINE NEEDLE ASPIRATION  07/05/2023   Procedure: FINE NEEDLE ASPIRATION (FNA) LINEAR;  Surgeon: Shelah Lamar GORMAN, MD;  Location: [facility] ENDOSCOPY;  Service: Pulmonary;;   LEFT HEART CATH AND CORONARY ANGIOGRAPHY N/A 11/07/2019   Procedure: LEFT HEART CATH AND  CORONARY ANGIOGRAPHY;  Surgeon: Darron Deatrice LABOR, MD;  Location: [facility]VAS[facility] CV LAB;  Service: Cardiovascular;  Laterality: N/A;   LEFT HEART CATHETERIZATION WITH CORONARY ANGIOGRAM N/A 11/25/2011   Procedure: LEFT HEART CATHETERIZATION WITH CORONARY ANGIOGRAM;  Surgeon: Alm LELON Clay, MD;  Location: [facility] CATH LAB;  Service: Cardiovascular;  Laterality: N/A;   PACEMAKER IMPLANT N/A 07/12/2018   Procedure: PACEMAKER IMPLANT;  Surgeon: Francyne Headland, MD;  Location: [facility]VAS[facility] CV LAB;  Service: Cardiovascular;  Laterality: N/A;   POSTERIOR FUSION LUMBAR SPINE  1979   ROTATOR CUFF REPAIR  2000's   left   V-TACH ABLATION N/A 06/06/2012   Procedure: V-TACH ABLATION;  Surgeon: Lynwood Rakers, MD;  Location: [facility] CATH LAB;  Service: Cardiovascular;  Laterality: N/A;   VIDEO BRONCHOSCOPY WITH ENDOBRONCHIAL ULTRASOUND N/A 07/05/2023   Procedure: VIDEO BRONCHOSCOPY WITH ENDOBRONCHIAL ULTRASOUND;  Surgeon: Shelah Lamar GORMAN, MD;  Location: [facility] ENDOSCOPY;  Service: Pulmonary;  Laterality: N/A;    REVIEW OF SYSTEMS:  A comprehensive review of systems was negative except for: Neurological: positive for dizziness   PHYSICAL EXAMINATION: General appearance: alert, cooperative, and no distress Head: Normocephalic, without obvious abnormality, atraumatic Neck: no adenopathy, no JVD, supple, symmetrical, trachea midline, and thyroid  not enlarged, symmetric, no tenderness/mass/nodules Lymph nodes: Cervical, supraclavicular, and axillary nodes normal. Resp: clear to auscultation bilaterally Back: symmetric, no curvature. ROM normal. No CVA tenderness. Cardio: regular rate and rhythm, S1, S2 normal, no murmur, click, rub or gallop GI: soft, non-tender; bowel sounds normal; no masses,  no organomegaly Extremities: extremities normal, atraumatic, no cyanosis or edema  ECOG PERFORMANCE STATUS: 1 - Symptomatic but completely ambulatory  Blood pressure (!) 147/66, pulse 71, temperature (!) 97.2 F (36.2 C), temperature  source Temporal, resp. rate 16, height 5' 7 (1.702 m), weight 163 lb 4.8 oz (74.1 kg), SpO2 98%.  LABORATORY DATA: Lab Results  Component Value Date   WBC 4.2 12/28/2023   HGB 10.9 (L) 12/28/2023   HCT 33.5 (L) 12/28/2023   MCV 94.4 12/28/2023   PLT 109 (L) 12/28/2023      Chemistry      Component Value Date/Time   NA 141 12/28/2023 0932   NA 142 11/11/2022 1000   K 4.0 12/28/2023 0932   CL 110 12/28/2023 0932   CO2 26 12/28/2023 0932   BUN 18 12/28/2023 0932   BUN 18 11/11/2022 1000   CREATININE 1.35 (H) 12/28/2023 0932   CREATININE 1.37 (H) 01/24/2017 0950      Component Value Date/Time   CALCIUM  9.7 12/28/2023 0932  ALKPHOS 52 12/28/2023 0932   AST 14 (L) 12/28/2023 0932   ALT 8 12/28/2023 0932   BILITOT 0.7 12/28/2023 0932       RADIOGRAPHIC STUDIES: No results found.  ASSESSMENT AND PLAN: This is a very pleasant 83 years old African-American male with Stage IIIC/IV (T3, N3, M0) non-small cell lung cancer, adenocarcinoma. He presented with a left upper lobe spiculated mass in addition to lingular lesion and suspicious mediastinal lymphadenopathy. He started a course of concurrent chemoradiation initially with carboplatin  for AUC of 2 and paclitaxel  45 Mg/M2 but paclitaxel  was the changed to Abraxane  starting from cycle #2 secondary to hypersensitivity reaction.  The patient received 5 cycles of concurrent chemoradiation.  He tolerated this treatment fairly well except for fatigue. The patient is currently on consolidation treatment with immunotherapy with Imfinzi  1500 Mg IV every 4 weeks.  Status post 1 cycle.  He tolerated the first cycle of his treatment fairly well.    Stage IIIc/IV Non-Small Cell Lung Cancer (NSCLC) Adenocarcinoma Diagnosed in August 2024. Underwent chemotherapy and radiation with good response. Currently on consolidation immunotherapy with durvalumab  (Imfinzi ) every four weeks. Completed one cycle with mild dizziness and pruritus. No nausea,  vomiting, rash, fever, or chills. Blood work within normal limits. Discussed immunotherapy purpose to prevent recurrence. Advised on OTC antihistamines for pruritus. - Administer second cycle of durvalumab  today - Recommend OTC antihistamines (Zyrtec , Claritin , Allegra) for pruritus - Schedule follow-up in four weeks - Advise to call if any issues arise.   The patient was advised to call immediately if he has any concerning symptoms in the interval. The patient voices understanding of current disease status and treatment options and is in agreement with the current care plan.  All questions were answered. The patient knows to call the clinic with any problems, questions or concerns. We can certainly see the patient much sooner if necessary.  The total time spent in the appointment was 20 minutes.  Disclaimer: This note was dictated with voice recognition software. Similar sounding words can inadvertently be transcribed and may not be corrected upon review.

## 2023-12-28 NOTE — Progress Notes (Signed)
 Nutrition Follow-up:  Patient with NSCLC. He is currently receiving durvalumab  q28d. Patient is under the care of Dr. Sherrod  Met with patient and wife in infusion. Patient reports good appetite and eating well. Had a turkey sandwich and piece of chocolate cake for his birthday. He drinks daily Ensure before bed. Patient denies nutrition impact symptoms. He endorses occasional dizziness. This is not bad and takes his time getting up. He denies syncope.  Medications: reviewed   Labs: Cr 1.35  Anthropometrics: Wt 163 lb 4.8 oz today slightly increased   12/30 - 162 lb 11.2 oz 11/19 - 167 lb    NUTRITION DIAGNOSIS: Food and nutrition related knowledge deficit improved    INTERVENTION:  Encouraged high calorie high protein foods for wt maintenance Continue drinking daily Ensure Complete/equivalent - samples + coupons provided     MONITORING, EVALUATION, GOAL: wt trends, intake   NEXT VISIT: To be scheduled as needed with treatment

## 2023-12-29 ENCOUNTER — Other Ambulatory Visit: Payer: Self-pay

## 2023-12-30 ENCOUNTER — Encounter: Payer: Self-pay | Admitting: Podiatry

## 2023-12-30 ENCOUNTER — Ambulatory Visit (INDEPENDENT_AMBULATORY_CARE_PROVIDER_SITE_OTHER): Payer: 59 | Admitting: Podiatry

## 2023-12-30 DIAGNOSIS — B351 Tinea unguium: Secondary | ICD-10-CM

## 2023-12-30 DIAGNOSIS — M79674 Pain in right toe(s): Secondary | ICD-10-CM

## 2023-12-30 DIAGNOSIS — M79675 Pain in left toe(s): Secondary | ICD-10-CM

## 2023-12-30 DIAGNOSIS — D689 Coagulation defect, unspecified: Secondary | ICD-10-CM

## 2023-12-30 NOTE — Progress Notes (Signed)
This patient returns to my office for at risk foot care.  This patient requires this care by a professional since this patient will be at risk due to having , PAD and  Chronic kidney disease.  .  This patient is unable to cut nails himself since the patient cannot reach his nails.These nails are painful walking and wearing shoes.  This patient presents for at risk foot care today.  General Appearance  Alert, conversant and in no acute stress.  Vascular  Dorsalis pedis and posterior tibial  pulses are palpable  bilaterally.  Capillary return is within normal limits  bilaterally. Temperature is within normal limits  bilaterally.  Neurologic  Senn-Weinstein monofilament wire test within normal limits  bilaterally. Muscle power within normal limits bilaterally.  Nails Thick disfigured discolored nails with subungual debris  from hallux to fifth toes bilaterally. No evidence of bacterial infection or drainage bilaterally.  Orthopedic  No limitations of motion  feet .  No crepitus or effusions noted.  No bony pathology or digital deformities noted.  HAV  B/L.  Skin  normotropic skin with no porokeratosis noted bilaterally.  No signs of infections or ulcers noted.     Onychomycosis  Pain in right toes  Pain in left toes  Consent was obtained for treatment procedures.   Mechanical debridement of nails 1-5  bilaterally performed with a nail nipper.  Filed with dremel without incident.    Return office visit   3 months                   Told patient to return for periodic foot care and evaluation due to potential at risk complications.   Brooklyn Jeff DPM  

## 2024-01-16 ENCOUNTER — Encounter: Payer: Self-pay | Admitting: Internal Medicine

## 2024-01-25 ENCOUNTER — Inpatient Hospital Stay: Payer: 59

## 2024-01-25 ENCOUNTER — Ambulatory Visit: Admitting: Physician Assistant

## 2024-01-25 ENCOUNTER — Inpatient Hospital Stay: Payer: 59 | Attending: Internal Medicine | Admitting: Internal Medicine

## 2024-01-25 ENCOUNTER — Other Ambulatory Visit

## 2024-01-25 ENCOUNTER — Ambulatory Visit

## 2024-01-25 VITALS — BP 140/72 | HR 69 | Temp 96.7°F | Resp 17 | Ht 67.0 in | Wt 160.0 lb

## 2024-01-25 DIAGNOSIS — Z9226 Personal history of immune checkpoint inhibitor therapy: Secondary | ICD-10-CM | POA: Diagnosis not present

## 2024-01-25 DIAGNOSIS — Z5112 Encounter for antineoplastic immunotherapy: Secondary | ICD-10-CM | POA: Diagnosis present

## 2024-01-25 DIAGNOSIS — N1832 Chronic kidney disease, stage 3b: Secondary | ICD-10-CM | POA: Diagnosis not present

## 2024-01-25 DIAGNOSIS — M545 Low back pain, unspecified: Secondary | ICD-10-CM | POA: Diagnosis not present

## 2024-01-25 DIAGNOSIS — I5022 Chronic systolic (congestive) heart failure: Secondary | ICD-10-CM | POA: Diagnosis not present

## 2024-01-25 DIAGNOSIS — C3412 Malignant neoplasm of upper lobe, left bronchus or lung: Secondary | ICD-10-CM

## 2024-01-25 DIAGNOSIS — L270 Generalized skin eruption due to drugs and medicaments taken internally: Secondary | ICD-10-CM | POA: Insufficient documentation

## 2024-01-25 DIAGNOSIS — Z87442 Personal history of urinary calculi: Secondary | ICD-10-CM | POA: Diagnosis not present

## 2024-01-25 DIAGNOSIS — Z79899 Other long term (current) drug therapy: Secondary | ICD-10-CM | POA: Diagnosis not present

## 2024-01-25 DIAGNOSIS — Z923 Personal history of irradiation: Secondary | ICD-10-CM | POA: Insufficient documentation

## 2024-01-25 DIAGNOSIS — Z9221 Personal history of antineoplastic chemotherapy: Secondary | ICD-10-CM | POA: Diagnosis not present

## 2024-01-25 DIAGNOSIS — C773 Secondary and unspecified malignant neoplasm of axilla and upper limb lymph nodes: Secondary | ICD-10-CM | POA: Diagnosis not present

## 2024-01-25 DIAGNOSIS — R5383 Other fatigue: Secondary | ICD-10-CM | POA: Insufficient documentation

## 2024-01-25 DIAGNOSIS — I130 Hypertensive heart and chronic kidney disease with heart failure and stage 1 through stage 4 chronic kidney disease, or unspecified chronic kidney disease: Secondary | ICD-10-CM | POA: Insufficient documentation

## 2024-01-25 DIAGNOSIS — R0602 Shortness of breath: Secondary | ICD-10-CM | POA: Insufficient documentation

## 2024-01-25 DIAGNOSIS — Z860102 Personal history of hyperplastic colon polyps: Secondary | ICD-10-CM | POA: Diagnosis not present

## 2024-01-25 DIAGNOSIS — I252 Old myocardial infarction: Secondary | ICD-10-CM | POA: Diagnosis not present

## 2024-01-25 DIAGNOSIS — Z7901 Long term (current) use of anticoagulants: Secondary | ICD-10-CM | POA: Insufficient documentation

## 2024-01-25 DIAGNOSIS — L299 Pruritus, unspecified: Secondary | ICD-10-CM | POA: Diagnosis not present

## 2024-01-25 DIAGNOSIS — R519 Headache, unspecified: Secondary | ICD-10-CM | POA: Diagnosis not present

## 2024-01-25 DIAGNOSIS — I2510 Atherosclerotic heart disease of native coronary artery without angina pectoris: Secondary | ICD-10-CM | POA: Diagnosis not present

## 2024-01-25 LAB — CMP (CANCER CENTER ONLY)
ALT: 9 U/L (ref 0–44)
AST: 15 U/L (ref 15–41)
Albumin: 3.7 g/dL (ref 3.5–5.0)
Alkaline Phosphatase: 55 U/L (ref 38–126)
Anion gap: 6 (ref 5–15)
BUN: 21 mg/dL (ref 8–23)
CO2: 27 mmol/L (ref 22–32)
Calcium: 9.6 mg/dL (ref 8.9–10.3)
Chloride: 107 mmol/L (ref 98–111)
Creatinine: 1.31 mg/dL — ABNORMAL HIGH (ref 0.61–1.24)
GFR, Estimated: 53 mL/min — ABNORMAL LOW (ref >60)
Glucose, Bld: 117 mg/dL — ABNORMAL HIGH (ref 70–99)
Potassium: 4.2 mmol/L (ref 3.5–5.1)
Sodium: 140 mmol/L (ref 135–145)
Total Bilirubin: 0.6 mg/dL (ref 0.0–1.2)
Total Protein: 7.5 g/dL (ref 6.5–8.1)

## 2024-01-25 LAB — CBC WITH DIFFERENTIAL (CANCER CENTER ONLY)
Abs Immature Granulocytes: 0.01 10*3/uL (ref 0.00–0.07)
Basophils Absolute: 0 10*3/uL (ref 0.0–0.1)
Basophils Relative: 1 %
Eosinophils Absolute: 0.3 10*3/uL (ref 0.0–0.5)
Eosinophils Relative: 7 %
HCT: 36 % — ABNORMAL LOW (ref 39.0–52.0)
Hemoglobin: 11.7 g/dL — ABNORMAL LOW (ref 13.0–17.0)
Immature Granulocytes: 0 %
Lymphocytes Relative: 18 %
Lymphs Abs: 0.7 10*3/uL (ref 0.7–4.0)
MCH: 30.2 pg (ref 26.0–34.0)
MCHC: 32.5 g/dL (ref 30.0–36.0)
MCV: 93 fL (ref 80.0–100.0)
Monocytes Absolute: 0.4 10*3/uL (ref 0.1–1.0)
Monocytes Relative: 11 %
Neutro Abs: 2.5 10*3/uL (ref 1.7–7.7)
Neutrophils Relative %: 63 %
Platelet Count: 128 10*3/uL — ABNORMAL LOW (ref 150–400)
RBC: 3.87 MIL/uL — ABNORMAL LOW (ref 4.22–5.81)
RDW: 12.1 % (ref 11.5–15.5)
WBC Count: 3.9 10*3/uL — ABNORMAL LOW (ref 4.0–10.5)
nRBC: 0 % (ref 0.0–0.2)

## 2024-01-25 LAB — TSH: TSH: 0.121 u[IU]/mL — ABNORMAL LOW (ref 0.350–4.500)

## 2024-01-25 MED ORDER — HYDROCORTISONE 1 % EX LOTN: 1.0000 | TOPICAL_LOTION | Freq: Two times a day (BID) | CUTANEOUS | 0 refills | Status: DC

## 2024-01-25 MED ORDER — LORATADINE 10 MG PO TBDP: 10.0000 mg | ORAL_TABLET | Freq: Every day | ORAL | 0 refills | Status: DC

## 2024-01-25 MED ORDER — METHYLPREDNISOLONE 4 MG PO TBPK: ORAL_TABLET | ORAL | 0 refills | Status: DC

## 2024-01-25 NOTE — Progress Notes (Signed)
 [facility] [phone]   [phone]  OFFICE PROGRESS NOTE  [name], MD [address] [place] [address]  DIAGNOSIS: Stage IIIC/IV (T3, N3, M0) non-small cell lung cancer, adenocarcinoma. He presented with a left upper lobe spiculated mass in addition to lingular lesion and suspicious mediastinal lymphadenopathy He also has bilateral hypermetabolic axillary lymph nodes which could be related to metastatic disease versus inflammatory as the patient did have vaccines in both of his arms the week prior to his PET scan.  This was diagnosed in August 2024.   Molecular Studies: His PD-L1 expression was 1% and he has no actionable mutations.   PRIOR THERAPY: Concurrent chemoradiation with carboplatin for an AUC of 2 and paclitaxel 45 mg/m.  First dose expected on 08/15/23.  Status post 5 cycles of first cycle was given with carboplatin and paclitaxel and starting from cycle #2 he was on carboplatin and Abraxane secondary to hypersensitivity reaction to paclitaxel.   CURRENT THERAPY: Consolidation immunotherapy with Imfinzi 1500 Mg IV every 4 weeks.  First dose November 28, 2023.  Status post 2 cycles.  INTERVAL HISTORY: [name] 83 y.o. male returns to the clinic today for follow-up visit. Discussed the use of AI scribe software for clinical note transcription with the patient, who gave verbal consent to proceed.  History of Present Illness   [name] is an 83 year old male with stage 3C non-small cell lung cancer adenocarcinoma who presents with itching and rash.  He was diagnosed with stage 3C non-small cell lung cancer adenocarcinoma in August 2024. He underwent a course of chemoradiation and is currently on consolidation treatment with immunotherapy, specifically durvalumab, administered every four weeks. He has completed two cycles and is here for the third cycle.  He experiences itching all over his body, described as 'itching  and burns', along with a sensation of pain in his side, feeling like 'something stick, pain sticking in'. Initially, he did not notice a rash, but a rash was observed, attributed to the immunotherapy treatment. He uses Panadol for relief, but it causes sweating and does not provide significant relief. No other complaints aside from the itching and rash.       MEDICAL HISTORY: Past Medical History:  Diagnosis Date   AICD (automatic cardioverter/defibrillator) present    CAD (coronary artery disease) 80% stenosis diag of the LAD, 30% in OM2 branch of LCX in 2009    a. Nonobstructive CAD by cath 11/2011 with the exception of the pre-existing diagonal branch #2 lesion.   Chronic systolic CHF (congestive heart failure) (HCC)    CKD (chronic kidney disease) stage 3, GFR 30-59 ml/min (HCC)    Colon polyp, hyperplastic    History of kidney stones    History of radiation therapy    Left Lung- 08/17/23-09/29/23- Dr. Antony Blackbird   History of stress test 06/01/2012   Normal myocardial perfusion study. compared to the previous study there is no significant change. this is a low risk scan   Hypertension    Legally blind    "both eyes"   Myocardial infarction [facility]) 11/22/2011   NICM (nonischemic cardiomyopathy) (HCC)    a. Remote hx of dilated NICM with EF ranging 20-45%, including normal EF by echo (55-60%) in 2014.   Peripheral arterial disease (HCC)    a. 06/2014: ABI right 0.99, left 1.2, LE dopplers revealing an occluded right posterior tibial. As symptoms were not felt r/t claudication, no further  w/u at the time.   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 Requiring discontinuation of BB dose.   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  MEDICATIONS:  Current Outpatient  Medications  Medication Sig Dispense Refill   albuterol (VENTOLIN HFA) 108 (90 Base) MCG/ACT inhaler Inhale 1-2 puffs into the lungs every 6 (six) hours as needed for wheezing or shortness of breath.     apixaban (ELIQUIS) 2.5 MG TABS tablet Take 1 tablet (2.5 mg total) by mouth 2 (two) times daily. Okay to restart this medication on 07/06/2023     BREO ELLIPTA 100-25 MCG/INH AEPB Inhale 1 puff into the lungs every morning.     brimonidine (ALPHAGAN) 0.2 % ophthalmic solution Place 1 drop into both eyes 2 (two) times daily.     cycloSPORINE (RESTASIS) 0.05 % ophthalmic emulsion Place 1 drop into both eyes 2 (two) times daily.     diclofenac Sodium (VOLTAREN) 1 % GEL Apply 2 g topically 4 (four) times daily. 100 g 0   dorzolamide-timolol (COSOPT) 22.3-6.8 MG/ML ophthalmic solution Place 1 drop into both eyes 2 (two) times daily.     ENTRESTO 24-26 MG TAKE 1 TABLET BY MOUTH TWICE DAILY 180 tablet 3   furosemide (LASIX) 20 MG tablet Take 1 tablet (20 mg total) by mouth See admin instructions. Take 20 mg by mouth EVERY OTHER MORNING     isosorbide mononitrate (IMDUR) 30 MG 24 hr tablet Take 30 mg by mouth daily.     metoprolol succinate (TOPROL-XL) 100 MG 24 hr tablet TAKE 1 TABLET(100 MG) BY MOUTH DAILY WITH OR IMMEDIATELY FOLLOWING A MEAL 90 tablet 3   nitroGLYCERIN (NITROSTAT) 0.4 MG SL tablet Place 1 tablet (0.4 mg total) under the tongue every 5 (five) minutes as needed for chest pain. 25 tablet 1   prochlorperazine (COMPAZINE) 10 MG tablet Take 1 tablet (10 mg total) by mouth every 6 (six) hours as needed. 30 tablet 2   ROCKLATAN 0.02-0.005 % SOLN Apply 1 drop to eye at bedtime.     spironolactone (ALDACTONE) 25 MG tablet Take 0.5 tablets (12.5 mg total) by mouth daily. 90 tablet 2   sucralfate (CARAFATE) 1 g tablet Crush and dissolve in 4 oz of warm water prior to swallowing. Can be taken 3 times daily with meals and at bedtime. 120 tablet 0   No current facility-administered medications for  this visit.    SURGICAL HISTORY:  Past Surgical History:  Procedure Laterality Date   BACK SURGERY     BIV UPGRADE N/A 11/08/2019   Procedure: BIV UPGRADE;  Surgeon: Marinus Maw, MD;  Location: [facility] INVASIVE CV LAB;  Service: Cardiovascular;  Laterality: N/A;   BRONCHIAL BIOPSY  07/05/2023   Procedure: BRONCHIAL BIOPSIES;  Surgeon: Leslye Peer, MD;  Location: [facility] ENDOSCOPY;  Service: Pulmonary;;   BRONCHIAL BRUSHINGS  07/05/2023   Procedure: BRONCHIAL BRUSHINGS;  Surgeon: Leslye Peer, MD;  Location: [facility] ENDOSCOPY;  Service: Pulmonary;;   BRONCHIAL NEEDLE ASPIRATION BIOPSY  07/05/2023   Procedure: BRONCHIAL NEEDLE ASPIRATION BIOPSIES;  Surgeon: Leslye Peer, MD;  Location: [facility] ENDOSCOPY;  Service: Pulmonary;;   BRONCHIAL WASHINGS  07/05/2023   Procedure: BRONCHIAL WASHINGS;  Surgeon: Leslye Peer, MD;  Location: [facility] ENDOSCOPY;  Service: Pulmonary;;   CARDIAC CATHETERIZATION  11/2011   CARDIAC CATHETERIZATION  11/2011   didn't demonstrate high grade obstructive disease to account for his LV dysfunction.   CATARACT EXTRACTION, BILATERAL  1990's   CYSTOSCOPY     CYSTOSCOPY WITH BIOPSY Bilateral 08/03/2021   Procedure: CYSTOSCOPY WITH BLADDER BIOPSY, BILATERAL RETROGRADE PYELOGRAM;  Surgeon: Crista Elliot, MD;  Location: [facility] ORS;  Service: Urology;  Laterality: Bilateral;  REQUESTING 45 MINS   CYSTOSCOPY WITH URETHRAL DILATATION N/A 05/04/2013   Procedure: CYSTOSCOPY WITH URETHRAL DILATATION;  Surgeon: Tia Alert, MD;  Location: [facility] NEURO ORS;  Service: Neurosurgery;  Laterality: N/A;  with insertion of foley catheter   EP study and ablation of VT  7/13   PVC focus mapped to the right coronary cusp of the aorta, limited ablation performed due to proximity of the focus to the right coronary artery   EYE SURGERY     FINE NEEDLE ASPIRATION  07/05/2023   Procedure: FINE NEEDLE ASPIRATION (FNA) LINEAR;  Surgeon: Leslye Peer, MD;  Location: [facility] ENDOSCOPY;  Service: Pulmonary;;   LEFT  HEART CATH AND CORONARY ANGIOGRAPHY N/A 11/07/2019   Procedure: LEFT HEART CATH AND CORONARY ANGIOGRAPHY;  Surgeon: Iran Ouch, MD;  Location: [facility]AS[facility] CV LAB;  Service: Cardiovascular;  Laterality: N/A;   LEFT HEART CATHETERIZATION WITH CORONARY ANGIOGRAM N/A 11/25/2011   Procedure: LEFT HEART CATHETERIZATION WITH CORONARY ANGIOGRAM;  Surgeon: Marykay Lex, MD;  Location: [facility] CATH LAB;  Service: Cardiovascular;  Laterality: N/A;   PACEMAKER IMPLANT N/A 07/12/2018   Procedure: PACEMAKER IMPLANT;  Surgeon: Thurmon Fair, MD;  Location: [facility] INVAS[facility] CV LAB;  Service: Cardiovascular;  Laterality: N/A;   POSTERIOR FUSION LUMBAR SPINE  1979   ROTATOR CUFF REPAIR  2000's   left   V-TACH ABLATION N/A 06/06/2012   Procedure: V-TACH ABLATION;  Surgeon: Hillis Range, MD;  Location: [facility] CATH LAB;  Service: Cardiovascular;  Laterality: N/A;   VIDEO BRONCHOSCOPY WITH ENDOBRONCHIAL ULTRASOUND N/A 07/05/2023   Procedure: VIDEO BRONCHOSCOPY WITH ENDOBRONCHIAL ULTRASOUND;  Surgeon: Leslye Peer, MD;  Location: [facility] ENDOSCOPY;  Service: Pulmonary;  Laterality: N/A;    REVIEW OF SYSTEMS:  Constitutional: negative Eyes: negative Ears, nose, mouth, throat, and face: negative Respiratory: negative Cardiovascular: negative Gastrointestinal: negative Genitourinary:negative Integument/breast: positive for dryness, pruritus, and rash Hematologic/lymphatic: negative Musculoskeletal:negative Neurological: negative Behavioral/Psych: negative Endocrine: negative Allergic/Immunologic: negative   PHYSICAL EXAMINATION: General appearance: alert, cooperative, and no distress Head: Normocephalic, without obvious abnormality, atraumatic Neck: no adenopathy, no JVD, supple, symmetrical, trachea midline, and thyroid not enlarged, symmetric, no tenderness/mass/nodules Lymph nodes: Cervical, supraclavicular, and axillary nodes normal. Resp: clear to auscultation bilaterally Back: symmetric, no curvature. ROM  normal. No CVA tenderness. Cardio: regular rate and rhythm, S1, S2 normal, no murmur, click, rub or gallop GI: soft, non-tender; bowel sounds normal; no masses,  no organomegaly Extremities: extremities normal, atraumatic, no cyanosis or edema Neurologic: Alert and oriented X 3, normal strength and tone. Normal symmetric reflexes. Normal coordination and gait  ECOG PERFORMANCE STATUS: 1 - Symptomatic but completely ambulatory  Blood pressure (!) 140/72, pulse 69, temperature (!) 96.7 F (35.9 C), temperature source Temporal, resp. rate 17, height 5\' 7"  (1.702 m), weight 160 lb (72.6 kg), SpO2 98%.  LABORATORY DATA: Lab Results  Component Value Date   WBC 3.9 (L) 01/25/2024   HGB 11.7 (L) 01/25/2024   HCT 36.0 (L) 01/25/2024   MCV 93.0 01/25/2024   PLT 128 (L) 01/25/2024      Chemistry      Component  Value Date/Time   NA 141 12/28/2023 0932   NA 142 11/11/2022 1000   K 4.0 12/28/2023 0932   CL 110 12/28/2023 0932   CO2 26 12/28/2023 0932   BUN 18 12/28/2023 0932   BUN 18 11/11/2022 1000   CREATININE 1.35 (H) 12/28/2023 0932   CREATININE 1.37 (H) 01/24/2017 0950      Component Value Date/Time   CALCIUM 9.7 12/28/2023 0932   ALKPHOS 52 12/28/2023 0932   AST 14 (L) 12/28/2023 0932   ALT 8 12/28/2023 0932   BILITOT 0.7 12/28/2023 0932       RADIOGRAPHIC STUDIES: No results found.  ASSESSMENT AND PLAN: This is a very pleasant 83 years old African-American male with Stage IIIC/IV (T3, N3, M0) non-small cell lung cancer, adenocarcinoma. He presented with a left upper lobe spiculated mass in addition to lingular lesion and suspicious mediastinal lymphadenopathy. He started a course of concurrent chemoradiation initially with carboplatin for AUC of 2 and paclitaxel 45 Mg/M2 but paclitaxel was the changed to Abraxane starting from cycle #2 secondary to hypersensitivity reaction.  The patient received 5 cycles of concurrent chemoradiation.  He tolerated this treatment fairly well  except for fatigue. The patient is currently on consolidation treatment with immunotherapy with Imfinzi 1500 Mg IV every 4 weeks.  Status post 2 cycles.  He has been tolerating this treatment well except for the itching and rash The patient he in the lower abdomen and back.    Stage III C Non-Small Cell Lung Cancer (NSCLC) Adenocarcinoma Diagnosed in August 2024. Currently undergoing consolidation treatment with durvalumab every four weeks. Today is the third cycle. No new complaints related to cancer progression. Discussed holding treatment for a week due to adverse effects and resuming next week. - Administer third cycle of durvalumab - Hold treatment for a week if symptoms persist - Resume durvalumab treatment next week  Pruritus and Rash Secondary to Immunotherapy Reports generalized pruritus and burning sensation with rough skin texture. Physical examination reveals a rash. Symptoms likely secondary to durvalumab. Discussed holding treatment for a week and initiating steroids. Discussed alternative symptom relief options. - Prescribe Medrol dose pack and hydrocortisone lotion topically. - Discuss alternative options such as Claritin or Zyrtec  Follow-up - Send Medrol dose pack prescription to [facility] on [address] - Advise to monitor symptoms and report any worsening or new symptoms.   The patient was advised to call immediately if he has any other concerning symptoms in the interval.  The patient voices understanding of current disease status and treatment options and is in agreement with the current care plan.  All questions were answered. The patient knows to call the clinic with any problems, questions or concerns. We can certainly see the patient much sooner if necessary.  The total time spent in the appointment was 30 minutes.  Disclaimer: This note was dictated with voice recognition software. Similar sounding words can inadvertently be transcribed and may not be  corrected upon review.

## 2024-01-26 ENCOUNTER — Telehealth: Payer: Self-pay | Admitting: Internal Medicine

## 2024-01-26 LAB — T4: T4, Total: 10.2 ug/dL (ref 4.5–12.0)

## 2024-01-26 NOTE — Telephone Encounter (Signed)
 Scheduled appointments and the patient is aware of the changes made.

## 2024-01-27 ENCOUNTER — Other Ambulatory Visit: Payer: Self-pay

## 2024-01-27 NOTE — Progress Notes (Signed)
 [facility] OFFICE PROGRESS NOTE  Rodney Rakers, MD [address], [address] [place] [address]  DIAGNOSIS: Stage IIIC/IV (T3, N3, M0) non-small cell lung cancer, adenocarcinoma. He presented with a left upper lobe spiculated mass in addition to lingular lesion and suspicious mediastinal lymphadenopathy He also has bilateral hypermetabolic axillary lymph nodes which could be related to metastatic disease versus inflammatory as the patient did have vaccines in both of his arms the week prior to his PET scan.  This was diagnosed in August 2024.   Molecular Studies: His PD-L1 expression was 1% and he has no actionable mutations.  PRIOR THERAPY:  Concurrent chemoradiation with carboplatin for an AUC of 2 and paclitaxel 45 mg/m.  First dose expected on 08/15/23.  Status post 5 cycles of first cycle was given with carboplatin and paclitaxel and starting from cycle #2 he was on carboplatin and Abraxane secondary to hypersensitivity reaction to paclitaxel.   CURRENT THERAPY: Consolidation immunotherapy with Imfinzi 1500 Mg IV every 4 weeks.  First dose November 28, 2023.  Status post 2 cycles.   INTERVAL HISTORY: [name] 83 y.o. male returns to the clinic today for a follow-up visit.  The patient was last seen in the clinic last week by Dr. Arbutus Ped.  The patient was scheduled for his third cycle of immunotherapy last week; ever, the patient was endorsing a burning and itching like sensation on his skin with rash. Dr. Arbutus Ped prescribed a Medrol Dosepak and delayed his treatment by 1 week to assess for improvement in his symptoms.  Today, the patient continues to report the same symptoms which is frustrating for him.  He states that he continues to have burning and itching and a rash.  Because of the itching it is interfering with his sleep so it is causing him to feel fatigued.  He has tried taking Claritin.  He has tried taking Benadryl at night which does not help with the itch.  The  steroids made him sweat.  He denies any fever, chills, night sweats, or unexplained weight loss.  His shortness of breath with exertion is stable.  He states that he still sometimes gets short winded but that is normal for him.  He denies any changes with cough besides his "normal" cough.  Denies any hemoptysis or chest pain.  Denies any nausea, vomiting, diarrhea, or constipation.  Denies any changes.  He states he may get a slight headache from time to time but it improves with Tylenol.  He is here today for evaluation repeat blood work before considering undergoing cycle #3.   MEDICAL HISTORY: Past Medical History:  Diagnosis Date   AICD (automatic cardioverter/defibrillator) present    CAD (coronary artery disease) 80% stenosis diag of the LAD, 30% in OM2 branch of LCX in 2009    a. Nonobstructive CAD by cath 11/2011 with the exception of the pre-existing diagonal branch #2 lesion.   Chronic systolic CHF (congestive heart failure) (HCC)    CKD (chronic kidney disease) stage 3, GFR 30-59 ml/min (HCC)    Colon polyp, hyperplastic    History of kidney stones    History of radiation therapy    Left Lung- 08/17/23-09/29/23- Dr. Antony Blackbird   History of stress test 06/01/2012   Normal myocardial perfusion study. compared to the previous study there is no significant change. this is a low risk scan   Hypertension    Legally blind    "both eyes"   Myocardial infarction [facility]) 11/22/2011  NICM (nonischemic cardiomyopathy) (HCC)    a. Remote hx of dilated NICM with EF ranging 20-45%, including normal EF by echo (55-60%) in 2014.   Peripheral arterial disease (HCC)    a. 06/2014: ABI right 0.99, left 1.2, LE dopplers revealing an occluded right posterior tibial. As symptoms were not felt r/t claudication, no further w/u at the time.   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 Requiring discontinuation of BB dose.    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  MEDICATIONS:  Current Outpatient Medications  Medication Sig Dispense Refill   hydrOXYzine (ATARAX) 10 MG tablet Take 1 tablet (10 mg total) by mouth at bedtime. 30 tablet 0   predniSONE (DELTASONE) 10 MG tablet Take 7 tablets in the morning daily (70 mg) for 1 week, followed by 5 tablets in the morning (50 mg) for 1 week, followed by 3 tablets in the morning (30 mg) daily for 1 week, followed by 1 tablet in the morning for one week, then stop 112 tablet 0   triamcinolone cream (KENALOG) 0.1 % Apply 1 Application topically 2 (two) times daily. 453.6 g 0   albuterol (VENTOLIN HFA) 108 (90 Base) MCG/ACT inhaler Inhale 1-2 puffs into the lungs every 6 (six) hours as needed for wheezing or shortness of breath.     apixaban (ELIQUIS) 2.5 MG TABS tablet Take 1 tablet (2.5 mg total) by mouth 2 (two) times daily. Okay to restart this medication on 07/06/2023     BREO ELLIPTA 100-25 MCG/INH AEPB Inhale 1 puff into the lungs every morning.     brimonidine (ALPHAGAN) 0.2 % ophthalmic solution Place 1 drop into both eyes 2 (two) times daily.     cycloSPORINE (RESTASIS) 0.05 % ophthalmic emulsion Place 1 drop into both eyes 2 (two) times daily.     diclofenac Sodium (VOLTAREN) 1 % GEL Apply 2 g topically 4 (four) times daily. 100 g 0   dorzolamide-timolol (COSOPT) 22.3-6.8 MG/ML ophthalmic solution Place 1 drop into both eyes 2 (two) times daily.     ENTRESTO 24-26 MG TAKE 1 TABLET BY MOUTH TWICE DAILY 180 tablet 3   furosemide (LASIX) 20 MG tablet Take 1 tablet (20 mg total) by mouth See admin instructions. Take 20 mg by mouth EVERY OTHER MORNING     hydrocortisone 1 % lotion Apply 1 Application topically 2 (two) times daily. 118 mL 0   isosorbide mononitrate (IMDUR) 30 MG 24 hr tablet Take 30 mg by mouth daily.      loratadine (CLARITIN REDITABS) 10 MG dissolvable tablet Take 1 tablet (10 mg total) by mouth daily. 10 tablet 0   metoprolol succinate (TOPROL-XL) 100 MG 24 hr tablet TAKE 1 TABLET(100 MG) BY MOUTH DAILY WITH OR IMMEDIATELY FOLLOWING A MEAL 90 tablet 3   nitroGLYCERIN (NITROSTAT) 0.4 MG SL tablet Place 1 tablet (0.4 mg total) under the tongue every 5 (five) minutes as needed for chest pain. 25 tablet 1   prochlorperazine (COMPAZINE) 10 MG tablet Take 1 tablet (10 mg total) by mouth every 6 (six) hours as needed. 30 tablet 2   ROCKLATAN 0.02-0.005 % SOLN Apply 1 drop to eye at bedtime.     spironolactone (ALDACTONE) 25 MG tablet Take 0.5 tablets (12.5 mg total) by mouth daily. 90 tablet 2  sucralfate (CARAFATE) 1 g tablet Crush and dissolve in 4 oz of warm water prior to swallowing. Can be taken 3 times daily with meals and at bedtime. 120 tablet 0   No current facility-administered medications for this visit.    SURGICAL HISTORY:  Past Surgical History:  Procedure Laterality Date   BACK SURGERY     BIV UPGRADE N/A 11/08/2019   Procedure: BIV UPGRADE;  Surgeon: Marinus Maw, MD;  Location: [facility] INVASIVE CV LAB;  Service: Cardiovascular;  Laterality: N/A;   BRONCHIAL BIOPSY  07/05/2023   Procedure: BRONCHIAL BIOPSIES;  Surgeon: Leslye Peer, MD;  Location: [facility] ENDOSCOPY;  Service: Pulmonary;;   BRONCHIAL BRUSHINGS  07/05/2023   Procedure: BRONCHIAL BRUSHINGS;  Surgeon: Leslye Peer, MD;  Location: [facility] ENDOSCOPY;  Service: Pulmonary;;   BRONCHIAL NEEDLE ASPIRATION BIOPSY  07/05/2023   Procedure: BRONCHIAL NEEDLE ASPIRATION BIOPSIES;  Surgeon: Leslye Peer, MD;  Location: [facility] ENDOSCOPY;  Service: Pulmonary;;   BRONCHIAL WASHINGS  07/05/2023   Procedure: BRONCHIAL WASHINGS;  Surgeon: Leslye Peer, MD;  Location: [facility] ENDOSCOPY;  Service: Pulmonary;;   CARDIAC CATHETERIZATION  11/2011   CARDIAC CATHETERIZATION  11/2011   didn't demonstrate high grade obstructive disease to account for  his LV dysfunction.   CATARACT EXTRACTION, BILATERAL  1990's   CYSTOSCOPY     CYSTOSCOPY WITH BIOPSY Bilateral 08/03/2021   Procedure: CYSTOSCOPY WITH BLADDER BIOPSY, BILATERAL RETROGRADE PYELOGRAM;  Surgeon: Crista Elliot, MD;  Location: [facility] ORS;  Service: Urology;  Laterality: Bilateral;  REQUESTING 45 MINS   CYSTOSCOPY WITH URETHRAL DILATATION N/A 05/04/2013   Procedure: CYSTOSCOPY WITH URETHRAL DILATATION;  Surgeon: Tia Alert, MD;  Location: [facility] NEURO ORS;  Service: Neurosurgery;  Laterality: N/A;  with insertion of foley catheter   EP study and ablation of VT  7/13   PVC focus mapped to the right coronary cusp of the aorta, limited ablation performed due to proximity of the focus to the right coronary artery   EYE SURGERY     FINE NEEDLE ASPIRATION  07/05/2023   Procedure: FINE NEEDLE ASPIRATION (FNA) LINEAR;  Surgeon: Leslye Peer, MD;  Location: [facility] ENDOSCOPY;  Service: Pulmonary;;   LEFT HEART CATH AND CORONARY ANGIOGRAPHY N/A 11/07/2019   Procedure: LEFT HEART CATH AND CORONARY ANGIOGRAPHY;  Surgeon: Iran Ouch, MD;  Location: [facility] INVASIVE CV LAB;  Service: Cardiovascular;  Laterality: N/A;   LEFT HEART CATHETERIZATION WITH CORONARY ANGIOGRAM N/A 11/25/2011   Procedure: LEFT HEART CATHETERIZATION WITH CORONARY ANGIOGRAM;  Surgeon: Marykay Lex, MD;  Location: [facility] CATH LAB;  Service: Cardiovascular;  Laterality: N/A;   PACEMAKER IMPLANT N/A 07/12/2018   Procedure: PACEMAKER IMPLANT;  Surgeon: Thurmon Fair, MD;  Location: [facility] INVASIVE CV LAB;  Service: Cardiovascular;  Laterality: N/A;   POSTERIOR FUSION LUMBAR SPINE  1979   ROTATOR CUFF REPAIR  2000's   left   V-TACH ABLATION N/A 06/06/2012   Procedure: V-TACH ABLATION;  Surgeon: Hillis Range, MD;  Location: [facility] CATH LAB;  Service: Cardiovascular;  Laterality: N/A;   VIDEO BRONCHOSCOPY WITH ENDOBRONCHIAL ULTRASOUND N/A 07/05/2023   Procedure: VIDEO BRONCHOSCOPY WITH ENDOBRONCHIAL ULTRASOUND;  Surgeon: Leslye Peer, MD;   Location: [facility] ENDOSCOPY;  Service: Pulmonary;  Laterality: N/A;    REVIEW OF SYSTEMS:   Review of Systems  Constitutional: Positive for fatigue. Negative for appetite change, chills, fever and unexpected weight change.  HENT: Negative for mouth sores, nosebleeds, sore throat and trouble swallowing.   Eyes: Negative for eye problems and  icterus.  Respiratory: Stable dyspnea on exertion and intermittent cough. Negative for  hemoptysis and wheezing.   Cardiovascular: Negative for chest pain and leg swelling.  Gastrointestinal: Negative for abdominal pain, constipation, diarrhea, nausea and vomiting.  Genitourinary: Negative for bladder incontinence, difficulty urinating, dysuria, frequency and hematuria.   Musculoskeletal: Positive for occasional low back pain. Negative for gait problem, neck pain and neck stiffness.  Skin: Positive for itching and rash  Neurological: Positive for mild occasional headaches that improve with tylenol. Negative for dizziness, extremity weakness, gait problem, light-headedness and seizures.  Hematological: Negative for adenopathy. Does not bruise/bleed easily.  Psychiatric/Behavioral: Negative for confusion, depression and sleep disturbance. The patient is not nervous/anxious.     PHYSICAL EXAMINATION:  Blood pressure 129/65, pulse 70, temperature 97.6 F (36.4 C), temperature source Temporal, resp. rate 16, height 5\' 7"  (1.702 m), weight 160 lb 12.8 oz (72.9 kg), SpO2 97%.  ECOG PERFORMANCE STATUS: 1  Physical Exam  Constitutional: Oriented to person, place, and time and well-developed, well-nourished, and in no distress.  HENT:  Head: Normocephalic and atraumatic.  Mouth/Throat: Oropharynx is clear and moist. No oropharyngeal exudate.  Eyes: Conjunctivae are normal. Right eye exhibits no discharge. Left eye exhibits no discharge. No scleral icterus.  Neck: Normal range of motion. Neck supple.  Cardiovascular: Normal rate, regular rhythm, normal heart sounds  and intact distal pulses.   Pulmonary/Chest: Effort normal and breath sounds normal. No respiratory distress. No wheezes. No rales.  Abdominal: Soft. Bowel sounds are normal. Exhibits no distension and no mass. There is no tenderness.  Musculoskeletal: Normal range of motion. Exhibits no edema.  Lymphadenopathy:    No cervical adenopathy.  Neurological: Alert and oriented to person, place, and time. Exhibits normal muscle tone. Gait normal. Coordination normal.  Skin: Skin is warm and dry. Positive for scattered rash on thorax. Not diaphoretic. No erythema. No pallor.  Psychiatric: Mood, memory and judgment normal.  Vitals reviewed.  LABORATORY DATA: Lab Results  Component Value Date   WBC 5.4 02/02/2024   HGB 11.9 (L) 02/02/2024   HCT 36.3 (L) 02/02/2024   MCV 93.8 02/02/2024   PLT 128 (L) 02/02/2024      Chemistry      Component Value Date/Time   NA 140 01/25/2024 0846   NA 142 11/11/2022 1000   K 4.2 01/25/2024 0846   CL 107 01/25/2024 0846   CO2 27 01/25/2024 0846   BUN 21 01/25/2024 0846   BUN 18 11/11/2022 1000   CREATININE 1.31 (H) 01/25/2024 0846   CREATININE 1.37 (H) 01/24/2017 0950      Component Value Date/Time   CALCIUM 9.6 01/25/2024 0846   ALKPHOS 55 01/25/2024 0846   AST 15 01/25/2024 0846   ALT 9 01/25/2024 0846   BILITOT 0.6 01/25/2024 0846       RADIOGRAPHIC STUDIES:  No results found.   ASSESSMENT/PLAN:  This is a very pleasant 83 year old African-American male with suspicious stage IIIC/IV (T3, N3, M0) non-small cell lung cancer, adenocarcinoma. He presented with a left upper lobe lung mass and lesion in the lingula.  He also has bilateral mediastinal lymph nodes and hypermetabolic bilateral axillary lymph nodes.  Although it is unclear if the axillary lymph nodes are related to metastatic disease or inflammatory as he did have bilateral vaccines in his upper arms just prior to his PET scan and his axillary biopsy was negative. He was He was  diagnosed in August 2024.    His molecular studies show no actionable mutations and  his PD-L1 expression is 1%  He started a course of concurrent chemoradiation initially with carboplatin for AUC of 2 and paclitaxel 45 Mg/M2 but paclitaxel was the changed to Abraxane starting from cycle #2 secondary to hypersensitivity reaction.  The patient received 5 cycles of concurrent chemoradiation.  He tolerated this treatment fairly well except for fatigue.  The patient is currently on consolidation treatment with immunotherapy with Imfinzi 1500 Mg IV every 4 weeks.  Status post 2 cycles.  He has been tolerating this treatment well except for the itching and rash The patient he in the lower abdomen and back.  Patient was seen with Dr. Arbutus Ped today.  We discussed that this could be secondary to his immunotherapy.  Therefore we able to put his care plan on hold and discontinue his treatment.  Dr. Arbutus Ped would recommend high-dose steroid taper.  I explained to the patient that this may cause him to feel hot.  Dr. Arbutus Ped recommended that he take these first thing in the morning.  He will take 70 mg daily for 1 week followed by 50 mg daily for 1 week, followed by 30 mg daily for 1 week, followed by 30 mg daily for 1 week then stop.  I will arrange for restaging CT scan of the chest in 1 month and we will see him back 1 week later to review the results and discuss next steps.  I will order this without contrast due to his CKD.  I sent him a prescription for Atarax for his itching.  Patient understands that this may cause him to feel drowsy.  Therefore I recommend just taking this at nighttime to help him sleep which is being disturbed by the itching.  He was advised not to drive while taking this medication.  I also sent him Kenalog cream to use topically for localized areas of itching.    The patient was advised to call immediately if she has any concerning symptoms in the interval. The patient voices  understanding of current disease status and treatment options and is in agreement with the current care plan. All questions were answered. The patient knows to call the clinic with any problems, questions or concerns. We can certainly see the patient much sooner if necessary       Orders Placed This Encounter  Procedures   CT CHEST WO CONTRAST    Standing Status:   Future    Expected Date:   02/27/2024    Expiration Date:   02/01/2025    Preferred imaging location?:   [facility]     Zayley Arras L Caitland Porchia, PA-C 02/02/24  ADDENDUM: Hematology/Oncology Attending:  I had a face-to-face encounter with the patient today.  I reviewed his record, lab and recommended his care plan.  This is a very pleasant 83 years old African-American male diagnosed with stage IIIc non-small cell lung cancer, adenocarcinoma in August 2024.  He is status post a course of concurrent chemoradiation with weekly carboplatin and Abraxane with partial response.  The patient then started consolidation treatment with immunotherapy with Imfinzi 1500 Mg IV every 4 weeks status post 2 cycles.  He was supposed to start cycle #3 last week but he presented with significant itching and rash that affecting his quality of life.  The patient was started on a Medrol Dosepak as well as Claritin with no improvement in his condition. I recommended for him to discontinue his current treatment with immunotherapy at this point. We will start him on high-dose prednisone to  be tapered over the next several weeks. We will see him back for follow-up visit in 1 months with repeat CT scan of the chest for restaging of his disease. The patient was advised to call immediately if he has any other concerning symptoms in the interval. The total time spent in the appointment was 30 minutes. Disclaimer: This note was dictated with voice recognition software. Similar sounding words can inadvertently be transcribed and may be missed upon  review. Lajuana Matte, MD

## 2024-02-02 ENCOUNTER — Inpatient Hospital Stay

## 2024-02-02 ENCOUNTER — Inpatient Hospital Stay (HOSPITAL_BASED_OUTPATIENT_CLINIC_OR_DEPARTMENT_OTHER): Admitting: Physician Assistant

## 2024-02-02 ENCOUNTER — Encounter: Payer: Self-pay | Admitting: Internal Medicine

## 2024-02-02 ENCOUNTER — Ambulatory Visit: Payer: 59 | Attending: Cardiovascular Disease | Admitting: Cardiovascular Disease

## 2024-02-02 VITALS — BP 129/65 | HR 70 | Temp 97.6°F | Resp 16 | Ht 67.0 in | Wt 160.8 lb

## 2024-02-02 DIAGNOSIS — Z87442 Personal history of urinary calculi: Secondary | ICD-10-CM | POA: Diagnosis not present

## 2024-02-02 DIAGNOSIS — N1832 Chronic kidney disease, stage 3b: Secondary | ICD-10-CM | POA: Diagnosis not present

## 2024-02-02 DIAGNOSIS — C3412 Malignant neoplasm of upper lobe, left bronchus or lung: Secondary | ICD-10-CM

## 2024-02-02 DIAGNOSIS — Z923 Personal history of irradiation: Secondary | ICD-10-CM | POA: Diagnosis not present

## 2024-02-02 DIAGNOSIS — I130 Hypertensive heart and chronic kidney disease with heart failure and stage 1 through stage 4 chronic kidney disease, or unspecified chronic kidney disease: Secondary | ICD-10-CM | POA: Diagnosis not present

## 2024-02-02 DIAGNOSIS — Z860102 Personal history of hyperplastic colon polyps: Secondary | ICD-10-CM | POA: Diagnosis not present

## 2024-02-02 DIAGNOSIS — H47013 Ischemic optic neuropathy, bilateral: Secondary | ICD-10-CM | POA: Diagnosis not present

## 2024-02-02 DIAGNOSIS — Z961 Presence of intraocular lens: Secondary | ICD-10-CM | POA: Diagnosis not present

## 2024-02-02 DIAGNOSIS — M545 Low back pain, unspecified: Secondary | ICD-10-CM | POA: Diagnosis not present

## 2024-02-02 DIAGNOSIS — H401133 Primary open-angle glaucoma, bilateral, severe stage: Secondary | ICD-10-CM | POA: Diagnosis not present

## 2024-02-02 DIAGNOSIS — L299 Pruritus, unspecified: Secondary | ICD-10-CM | POA: Diagnosis not present

## 2024-02-02 DIAGNOSIS — H524 Presbyopia: Secondary | ICD-10-CM | POA: Diagnosis not present

## 2024-02-02 DIAGNOSIS — C773 Secondary and unspecified malignant neoplasm of axilla and upper limb lymph nodes: Secondary | ICD-10-CM | POA: Diagnosis not present

## 2024-02-02 DIAGNOSIS — R0602 Shortness of breath: Secondary | ICD-10-CM | POA: Diagnosis not present

## 2024-02-02 DIAGNOSIS — H35373 Puckering of macula, bilateral: Secondary | ICD-10-CM | POA: Diagnosis not present

## 2024-02-02 DIAGNOSIS — R5383 Other fatigue: Secondary | ICD-10-CM | POA: Diagnosis not present

## 2024-02-02 DIAGNOSIS — Z79899 Other long term (current) drug therapy: Secondary | ICD-10-CM | POA: Diagnosis not present

## 2024-02-02 DIAGNOSIS — L270 Generalized skin eruption due to drugs and medicaments taken internally: Secondary | ICD-10-CM | POA: Diagnosis not present

## 2024-02-02 DIAGNOSIS — I252 Old myocardial infarction: Secondary | ICD-10-CM | POA: Diagnosis not present

## 2024-02-02 DIAGNOSIS — R519 Headache, unspecified: Secondary | ICD-10-CM | POA: Diagnosis not present

## 2024-02-02 DIAGNOSIS — I2510 Atherosclerotic heart disease of native coronary artery without angina pectoris: Secondary | ICD-10-CM | POA: Diagnosis not present

## 2024-02-02 DIAGNOSIS — Z7901 Long term (current) use of anticoagulants: Secondary | ICD-10-CM | POA: Diagnosis not present

## 2024-02-02 DIAGNOSIS — Z9226 Personal history of immune checkpoint inhibitor therapy: Secondary | ICD-10-CM | POA: Diagnosis not present

## 2024-02-02 DIAGNOSIS — Z9221 Personal history of antineoplastic chemotherapy: Secondary | ICD-10-CM | POA: Diagnosis not present

## 2024-02-02 DIAGNOSIS — H4321 Crystalline deposits in vitreous body, right eye: Secondary | ICD-10-CM | POA: Diagnosis not present

## 2024-02-02 DIAGNOSIS — I5022 Chronic systolic (congestive) heart failure: Secondary | ICD-10-CM | POA: Diagnosis not present

## 2024-02-02 LAB — CMP (CANCER CENTER ONLY)
ALT: 12 U/L (ref 0–44)
AST: 13 U/L — ABNORMAL LOW (ref 15–41)
Albumin: 3.7 g/dL (ref 3.5–5.0)
Alkaline Phosphatase: 57 U/L (ref 38–126)
Anion gap: 4 — ABNORMAL LOW (ref 5–15)
BUN: 25 mg/dL — ABNORMAL HIGH (ref 8–23)
CO2: 30 mmol/L (ref 22–32)
Calcium: 9.2 mg/dL (ref 8.9–10.3)
Chloride: 106 mmol/L (ref 98–111)
Creatinine: 1.39 mg/dL — ABNORMAL HIGH (ref 0.61–1.24)
GFR, Estimated: 50 mL/min — ABNORMAL LOW (ref >60)
Glucose, Bld: 94 mg/dL (ref 70–99)
Potassium: 4.4 mmol/L (ref 3.5–5.1)
Sodium: 140 mmol/L (ref 135–145)
Total Bilirubin: 0.6 mg/dL (ref 0.0–1.2)
Total Protein: 7.2 g/dL (ref 6.5–8.1)

## 2024-02-02 LAB — CBC WITH DIFFERENTIAL (CANCER CENTER ONLY)
Abs Immature Granulocytes: 0.03 10*3/uL (ref 0.00–0.07)
Basophils Absolute: 0 10*3/uL (ref 0.0–0.1)
Basophils Relative: 0 %
Eosinophils Absolute: 0.2 10*3/uL (ref 0.0–0.5)
Eosinophils Relative: 3 %
HCT: 36.3 % — ABNORMAL LOW (ref 39.0–52.0)
Hemoglobin: 11.9 g/dL — ABNORMAL LOW (ref 13.0–17.0)
Immature Granulocytes: 1 %
Lymphocytes Relative: 23 %
Lymphs Abs: 1.2 10*3/uL (ref 0.7–4.0)
MCH: 30.7 pg (ref 26.0–34.0)
MCHC: 32.8 g/dL (ref 30.0–36.0)
MCV: 93.8 fL (ref 80.0–100.0)
Monocytes Absolute: 0.6 10*3/uL (ref 0.1–1.0)
Monocytes Relative: 11 %
Neutro Abs: 3.3 10*3/uL (ref 1.7–7.7)
Neutrophils Relative %: 62 %
Platelet Count: 128 10*3/uL — ABNORMAL LOW (ref 150–400)
RBC: 3.87 MIL/uL — ABNORMAL LOW (ref 4.22–5.81)
RDW: 12.7 % (ref 11.5–15.5)
WBC Count: 5.4 10*3/uL (ref 4.0–10.5)
nRBC: 0 % (ref 0.0–0.2)

## 2024-02-02 LAB — TSH: TSH: 0.086 u[IU]/mL — ABNORMAL LOW (ref 0.350–4.500)

## 2024-02-02 MED ORDER — HYDROXYZINE HCL 10 MG PO TABS: 10.0000 mg | ORAL_TABLET | Freq: Every day | ORAL | 0 refills | Status: DC

## 2024-02-02 MED ORDER — PREDNISONE 10 MG PO TABS: ORAL_TABLET | ORAL | 0 refills | Status: DC

## 2024-02-02 MED ORDER — TRIAMCINOLONE ACETONIDE 0.1 % EX CREA: 1.0000 | TOPICAL_CREAM | Freq: Two times a day (BID) | CUTANEOUS | 0 refills | Status: DC

## 2024-02-03 LAB — T4: T4, Total: 7.5 ug/dL (ref 4.5–12.0)

## 2024-02-16 ENCOUNTER — Encounter: Payer: Self-pay | Admitting: Cardiovascular Disease

## 2024-02-16 ENCOUNTER — Ambulatory Visit: Attending: Cardiovascular Disease | Admitting: Cardiovascular Disease

## 2024-02-16 VITALS — BP 118/68 | HR 75 | Ht 67.0 in | Wt 158.8 lb

## 2024-02-16 DIAGNOSIS — I5042 Chronic combined systolic (congestive) and diastolic (congestive) heart failure: Secondary | ICD-10-CM | POA: Diagnosis not present

## 2024-02-16 DIAGNOSIS — I700 Atherosclerosis of aorta: Secondary | ICD-10-CM

## 2024-02-16 DIAGNOSIS — I25118 Atherosclerotic heart disease of native coronary artery with other forms of angina pectoris: Secondary | ICD-10-CM | POA: Diagnosis not present

## 2024-02-16 DIAGNOSIS — D6869 Other thrombophilia: Secondary | ICD-10-CM

## 2024-02-16 DIAGNOSIS — R7303 Prediabetes: Secondary | ICD-10-CM | POA: Diagnosis not present

## 2024-02-16 DIAGNOSIS — I441 Atrioventricular block, second degree: Secondary | ICD-10-CM

## 2024-02-16 DIAGNOSIS — I10 Essential (primary) hypertension: Secondary | ICD-10-CM

## 2024-02-16 DIAGNOSIS — I495 Sick sinus syndrome: Secondary | ICD-10-CM | POA: Diagnosis not present

## 2024-02-16 DIAGNOSIS — N1832 Chronic kidney disease, stage 3b: Secondary | ICD-10-CM

## 2024-02-16 DIAGNOSIS — I480 Paroxysmal atrial fibrillation: Secondary | ICD-10-CM | POA: Diagnosis not present

## 2024-02-16 DIAGNOSIS — Z95810 Presence of automatic (implantable) cardiac defibrillator: Secondary | ICD-10-CM

## 2024-02-16 DIAGNOSIS — E785 Hyperlipidemia, unspecified: Secondary | ICD-10-CM

## 2024-02-16 DIAGNOSIS — I4729 Other ventricular tachycardia: Secondary | ICD-10-CM | POA: Diagnosis not present

## 2024-02-16 NOTE — Patient Instructions (Signed)
 Medication Instructions:  No changes *If you need a refill on your cardiac medications before your next appointment, please call your pharmacy*  Follow-Up: At [facility], you and your health needs are our priority.  As part of our continuing mission to provide you with exceptional heart care, we have created designated Provider Care Teams.  These Care Teams include your primary Cardiologist (physician) and Advanced Practice Providers (APPs -  Physician Assistants and Nurse Practitioners) who all work together to provide you with the care you need, when you need it.  We recommend signing up for the patient portal called "MyChart".  Sign up information is provided on this After Visit Summary.  MyChart is used to connect with patients for Virtual Visits (Telemedicine).  Patients are able to view lab/test results, encounter notes, upcoming appointments, etc.  Non-urgent messages can be sent to your provider as well.   To learn more about what you can do with MyChart, go to [email or web address].    Your next appointment:    5-6 months- Pacer Check  Provider:   Dr Royann Shivers

## 2024-02-16 NOTE — Progress Notes (Signed)
 Cardiology Office Note:    Date:  02/16/2024   ID:  [name], DOB [birth year], MRN [id number]  PCP:  [name], MD  Cardiologist: Tresa Endo Referring MD: [name], MD   Chief Complaint  Patient presents with   ICD check     History of Present Illness:    [name] is a 83 y.o. 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), asymptomatic paroxysmal atrial fibrillation recorded by pacemaker, presenting with ventricular tachycardia and syncope in December 2020, underwent upgrade to CRT-D device [facility] [place], new [place] 2Q [employer] ICD lead, Dr. Ladona Ridgel).  He is receiving chemotherapy for stage IIIC/IV (T3, N3, M0) non-small cell lung cancer, adenocarcinoma of the left upper lobe, his oncologist is Dr. Shirline Frees.  He has not had any cardiovascular issues, but has had problems with a rash related to chemotherapy so is now on a different regimen.  The patient specifically denies any chest pain at rest or with exertion, dyspnea at rest or with exertion, orthopnea, paroxysmal nocturnal dyspnea, syncope, palpitations, focal neurological deficits, intermittent claudication, lower extremity edema, unexplained weight gain, cough, hemoptysis or wheezing.   ICD interrogation shows stable device function.  He has 92% atrial pacing with good heart rate histogram distribution and 97% biventricular pacing.  Current generator longevity is about 3 years.  All lead parameters are excellent.  Presenting rhythm is atrial paced, biventricular  paced.  The burden of atrial fibrillation is similar to previous downloads at 1.5%.  There is good rate control.  Has not had any episodes of high ventricular rate or defibrillator discharges.  His thoracic impedance trend is steadily upwards suggesting that he may be a little hypovolemic now.  He has not had dizziness or syncope.  Heart failure therapy includes the maximum tolerated doses of Entresto, spironolactone, metoprolol succinate.  He is on anticoagulation with apixaban.  Metabolic parameters are good with an LDL of 55, hemoglobin A1c 4.0% and a stable creatinine at 1.68.  Past Medical History:  Diagnosis Date   AICD (automatic cardioverter/defibrillator) present    CAD (coronary artery disease) 80% stenosis diag of the LAD, 30% in OM2 branch of LCX in 2009    a. Nonobstructive CAD by cath 11/2011 with the exception of the pre-existing diagonal branch #2 lesion.   Chronic systolic CHF (congestive heart failure) (HCC)    CKD (chronic kidney disease) stage 3, GFR 30-59 ml/min (HCC)    Colon polyp, hyperplastic    History of kidney stones    History of radiation therapy    Left Lung- 08/17/23-09/29/23- Dr. Antony Blackbird   History of stress test 06/01/2012   Normal myocardial perfusion study. compared to the previous study there is no significant change. this is a low risk scan   Hypertension    Legally blind    "both eyes"   Myocardial infarction [facility]) 11/22/2011   NICM (nonischemic cardiomyopathy) (HCC)    a. Remote hx of dilated NICM with EF ranging 20-45%, including normal EF by echo (55-60%) in 2014.   Peripheral arterial disease (HCC)    a. 06/2014: ABI  right 0.99, left 1.2, LE dopplers revealing an occluded right posterior tibial. As symptoms were not felt r/t claudication, no further w/u at the time.   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 Requiring discontinuation of BB dose.   Spondylolisthesis     Ventricular bigeminy    Ventricular tachycardia (paroxysmal) (HCC) 04/11/2015    Past Surgical History:  Procedure Laterality Date   BACK SURGERY     BIV UPGRADE N/A 11/08/2019   Procedure: BIV UPGRADE;  Surgeon: Marinus Maw, MD;  Location: [facility] INVASIVE CV LAB;  Service: Cardiovascular;  Laterality: N/A;   BRONCHIAL BIOPSY  07/05/2023   Procedure: BRONCHIAL BIOPSIES;  Surgeon: Leslye Peer, MD;  Location: [facility] ENDOSCOPY;  Service: Pulmonary;;   BRONCHIAL BRUSHINGS  07/05/2023   Procedure: BRONCHIAL BRUSHINGS;  Surgeon: Leslye Peer, MD;  Location: [facility] ENDOSCOPY;  Service: Pulmonary;;   BRONCHIAL NEEDLE ASPIRATION BIOPSY  07/05/2023   Procedure: BRONCHIAL NEEDLE ASPIRATION BIOPSIES;  Surgeon: Leslye Peer, MD;  Location: [facility] ENDOSCOPY;  Service: Pulmonary;;   BRONCHIAL WASHINGS  07/05/2023   Procedure: BRONCHIAL WASHINGS;  Surgeon: Leslye Peer, MD;  Location: [facility] ENDOSCOPY;  Service: Pulmonary;;   CARDIAC CATHETERIZATION  11/2011   CARDIAC CATHETERIZATION  11/2011   didn't demonstrate high grade obstructive disease to account for his LV dysfunction.   CATARACT EXTRACTION, BILATERAL  1990's   CYSTOSCOPY     CYSTOSCOPY WITH BIOPSY Bilateral 08/03/2021   Procedure: CYSTOSCOPY WITH BLADDER BIOPSY, BILATERAL RETROGRADE PYELOGRAM;  Surgeon: Crista Elliot, MD;  Location: [facility] ORS;  Service: Urology;  Laterality: Bilateral;  REQUESTING 45 MINS   CYSTOSCOPY WITH URETHRAL DILATATION N/A 05/04/2013   Procedure: CYSTOSCOPY WITH URETHRAL DILATATION;  Surgeon: Tia Alert, MD;  Location: [facility] NEURO ORS;  Service: Neurosurgery;  Laterality: N/A;  with insertion of foley catheter   EP study and ablation of VT  7/13   PVC focus mapped to the right coronary cusp of the aorta, limited ablation performed due to proximity of the focus to the right coronary artery   EYE SURGERY     FINE NEEDLE ASPIRATION  07/05/2023   Procedure: FINE NEEDLE ASPIRATION (FNA) LINEAR;  Surgeon: Leslye Peer, MD;   Location: [facility] ENDOSCOPY;  Service: Pulmonary;;   LEFT HEART CATH AND CORONARY ANGIOGRAPHY N/A 11/07/2019   Procedure: LEFT HEART CATH AND CORONARY ANGIOGRAPHY;  Surgeon: Iran Ouch, MD;  Location: [facility] INVAS[facility] CV LAB;  Service: Cardiovascular;  Laterality: N/A;   LEFT HEART CATHETERIZATION WITH CORONARY ANGIOGRAM N/A 11/25/2011   Procedure: LEFT HEART CATHETERIZATION WITH CORONARY ANGIOGRAM;  Surgeon: Marykay Lex, MD;  Location: [facility] CATH LAB;  Service: Cardiovascular;  Laterality: N/A;   PACEMAKER IMPLANT N/A 07/12/2018   Procedure: PACEMAKER IMPLANT;  Surgeon: Thurmon Fair, MD;  Location: [facility] INVASIVE CV LAB;  Service: Cardiovascular;  Laterality: N/A;   POSTERIOR FUSION LUMBAR SPINE  1979   ROTATOR CUFF REPAIR  2000's   left   V-TACH ABLATION N/A 06/06/2012   Procedure: V-TACH ABLATION;  Surgeon: Hillis Range, MD;  Location: [facility] CATH LAB;  Service: Cardiovascular;  Laterality: N/A;   VIDEO BRONCHOSCOPY WITH ENDOBRONCHIAL ULTRASOUND N/A 07/05/2023   Procedure: VIDEO BRONCHOSCOPY WITH ENDOBRONCHIAL ULTRASOUND;  Surgeon: Leslye Peer, MD;  Location: [facility] ENDOSCOPY;  Service: Pulmonary;  Laterality: N/A;    Current Medications: Current Meds  Medication Sig   apixaban (ELIQUIS) 2.5 MG TABS tablet Take 1  tablet (2.5 mg total) by mouth 2 (two) times daily. Okay to restart this medication on 07/06/2023   BREO ELLIPTA 100-25 MCG/INH AEPB Inhale 1 puff into the lungs every morning.   brimonidine (ALPHAGAN) 0.2 % ophthalmic solution Place 1 drop into both eyes 2 (two) times daily.   cycloSPORINE (RESTASIS) 0.05 % ophthalmic emulsion Place 1 drop into both eyes 2 (two) times daily.   dorzolamide-timolol (COSOPT) 22.3-6.8 MG/ML ophthalmic solution Place 1 drop into both eyes 2 (two) times daily.   ENTRESTO 24-26 MG TAKE 1 TABLET BY MOUTH TWICE DAILY   furosemide (LASIX) 20 MG tablet Take 1 tablet (20 mg total) by mouth See admin instructions. Take 20 mg by mouth EVERY OTHER MORNING (Patient taking  differently: Take 20 mg by mouth daily.)   hydrOXYzine (ATARAX) 10 MG tablet Take 1 tablet (10 mg total) by mouth at bedtime.   isosorbide mononitrate (IMDUR) 30 MG 24 hr tablet Take 30 mg by mouth daily.   metoprolol succinate (TOPROL-XL) 100 MG 24 hr tablet TAKE 1 TABLET(100 MG) BY MOUTH DAILY WITH OR IMMEDIATELY FOLLOWING A MEAL   predniSONE (DELTASONE) 10 MG tablet Take 7 tablets in the morning daily (70 mg) for 1 week, followed by 5 tablets in the morning (50 mg) for 1 week, followed by 3 tablets in the morning (30 mg) daily for 1 week, followed by 1 tablet in the morning for one week, then stop   ROCKLATAN 0.02-0.005 % SOLN Place 1 drop into both eyes at bedtime.   spironolactone (ALDACTONE) 25 MG tablet Take 0.5 tablets (12.5 mg total) by mouth daily.     Allergies:   Paclitaxel, Allegra [fexofenadine], and Sonafine [wound dressings]   Family History: The patient's family history includes Asthma in his mother; Other in an other family member.  EKGs/Labs/Other Studies Reviewed:    The following studies were reviewed today: Comprehensive pacemaker check in the office today  EKG:  EKG is not ordered today.  Personally reviewed the tracing from 10/11/2023 shows AV sequentially paced rhythm positive R wave in lead V1.  EKG Interpretation Date/Time:    Ventricular Rate:    PR Interval:    QRS Duration:    QT Interval:    QTC Calculation:   R Axis:      Text Interpretation:          Recent Labs: 04/29/2023: B Natriuretic Peptide 399.4 02/02/2024: ALT 12; BUN 25; Creatinine 1.39; Hemoglobin 11.9; Platelet Count 128; Potassium 4.4; Sodium 140; TSH 0.086    Recent Lipid Panel    Component Value Date/Time   CHOL 118 01/12/2022 0916   TRIG 46 01/12/2022 0916   HDL 47 01/12/2022 0916   CHOLHDL 2.5 01/12/2022 0916   CHOLHDL 2.2 01/24/2017 0950   VLDL 8 01/24/2017 0950   LDLCALC 60 01/12/2022 0916    Physical Exam:    VS:  BP 118/68 (BP Location: Left Arm, Patient Position:  Sitting, Cuff Size: Normal)   Pulse 75   Ht 5\' 7"  (1.702 m)   Wt 158 lb 12.8 oz (72 kg)   SpO2 98%   BMI 24.87 kg/m     Wt Readings from Last 3 Encounters:  02/16/24 158 lb 12.8 oz (72 kg)  02/02/24 160 lb 12.8 oz (72.9 kg)  01/25/24 160 lb (72.6 kg)     General: Alert, oriented x3, no distress, healthy left subclavian pacemaker site Head: no evidence of trauma, PERRL, EOMI, no exophtalmos or lid lag, no myxedema, no xanthelasma; normal ears,  nose and oropharynx Neck: normal jugular venous pulsations and no hepatojugular reflux; brisk carotid pulses without delay and no carotid bruits Chest: clear to auscultation, no signs of consolidation by percussion or palpation, normal fremitus, symmetrical and full respiratory excursions Cardiovascular: normal position and quality of the apical impulse, regular rhythm, normal first and second heart sounds, no murmurs, rubs or gallops Abdomen: no tenderness or distention, no masses by palpation, no abnormal pulsatility or arterial bruits, normal bowel sounds, no hepatosplenomegaly Extremities: no clubbing, cyanosis or edema; 2+ radial, ulnar and brachial pulses bilaterally; 2+ right femoral, posterior tibial and dorsalis pedis pulses; 2+ left femoral, posterior tibial and dorsalis pedis pulses; no subclavian or femoral bruits Neurological: grossly nonfocal Psych: Normal mood and affect    ASSESSMENT:    1. Chronic combined systolic and diastolic heart failure (HCC)   2. Paroxysmal atrial fibrillation (HCC)   3. Acquired thrombophilia (HCC)   4. Second degree AV block, Mobitz type I   5. Biventricular automatic implantable cardioverter defibrillator in situ   6. SSS (sick sinus syndrome) (HCC)   7. Coronary artery disease of native artery of native heart with stable angina pectoris (HCC)   8. NSVT (nonsustained ventricular tachycardia) (HCC)   9. Essential hypertension   10. Chronic kidney disease, stage 3b (HCC)   11. Atherosclerosis of  aorta (HCC)   12. Prediabetes   13. Dyslipidemia (high LDL; low HDL)       PLAN:    In order of problems listed above:  HFrEF: NYHA functional class I.  Clinically appears euvolemic but his thoracic impedance suggest he is getting a little too dry..  On maximum tolerated doses of Entresto, spironolactone, metoprolol succinate.  Not on SGLT2 inhibitor.  Blood pressure probably will not allow additional dose titration.  I think he should take the furosemide on a as needed basis similar. Paroxysmal atrial fibrillation: Burden of arrhythmia remains low at 1.5% with good rate control.  Appropriately anticoagulated. CHADSVasc 5 (age 37, HTN, CHF, CAD).   Anticoagulation: No recent bleeding issues.  Had remote problems with epistaxis and some hematuria with his episode of cystitis a few months ago, but no serious bleeding.  Eliquis dose is adjusted for age and renal dysfunction. 2nd deg AVB: Not entirely pacemaker dependent. CRT-D: Less than 100% biventricular pacing due to frequent PVCs.  The atrial fib has really not interfered much with the CRT.  Normal device function.  Continue remote downloads every 3 months. SSS: Appropriate heart rate histogram distribution. CAD: No angina/asymptomatic.  2 antianginal medications (nitrates and beta-blockers).  Significant stenoses were seen only secondary coronary branches.  He does not have angina pectoris on combined antianginal therapy with nitrates and beta-blockers. NSVT: None has been seen in quite a long time.  Syncope related to VT last December 2021.  Had a VT ablation in 2013.   HTN: Blood pressure in desirable range. CKD 3: Most recent creatinine a little better at 1.39, but usually his creatinine has been in the 1.5-1.6 range.  GFR 40-45. Ao atherosclerosis: incidentally noted on CT, normal caliber aorta. preDM: Hemoglobin A1c 5.8% recently. HLP: Most recent LDL excellent 52.  Chronically low HDL at 35.   Advanced neoplastic lung disease: on  chemotherapy, followed in the cancer center by Dr. Arbutus Ped.   Medication Adjustments/Labs and Tests Ordered: Current medicines are reviewed at length with the patient today.  Concerns regarding medicines are outlined above.  No orders of the defined types were placed in this encounter.   No  orders of the defined types were placed in this encounter.    Patient Instructions  Medication Instructions:  No changes *If you need a refill on your cardiac medications before your next appointment, please call your pharmacy*  Follow-Up: At [facility], you and your health needs are our priority.  As part of our continuing mission to provide you with exceptional heart care, we have created designated Provider Care Teams.  These Care Teams include your primary Cardiologist (physician) and Advanced Practice Providers (APPs -  Physician Assistants and Nurse Practitioners) who all work together to provide you with the care you need, when you need it.  We recommend signing up for the patient portal called "MyChart".  Sign up information is provided on this After Visit Summary.  MyChart is used to connect with patients for Virtual Visits (Telemedicine).  Patients are able to view lab/test results, encounter notes, upcoming appointments, etc.  Non-urgent messages can be sent to your provider as well.   To learn more about what you can do with MyChart, go to [email or web address].    Your next appointment:    5-6 months- Pacer Check  Provider:   Dr Royann Shivers          Signed, Thurmon Fair, MD  02/16/2024 4:57 PM    [facility]

## 2024-02-21 ENCOUNTER — Other Ambulatory Visit: Payer: Self-pay | Admitting: Internal Medicine

## 2024-02-22 ENCOUNTER — Ambulatory Visit: Payer: 59

## 2024-02-22 ENCOUNTER — Ambulatory Visit: Payer: 59 | Admitting: Physician Assistant

## 2024-02-22 ENCOUNTER — Other Ambulatory Visit: Payer: 59

## 2024-02-23 ENCOUNTER — Emergency Department (HOSPITAL_COMMUNITY)

## 2024-02-23 ENCOUNTER — Other Ambulatory Visit: Payer: Self-pay

## 2024-02-23 ENCOUNTER — Encounter (HOSPITAL_COMMUNITY): Payer: Self-pay

## 2024-02-23 ENCOUNTER — Other Ambulatory Visit: Payer: Self-pay | Admitting: Cardiovascular Disease

## 2024-02-23 ENCOUNTER — Observation Stay (HOSPITAL_COMMUNITY)
Admission: EM | Admit: 2024-02-23 | Discharge: 2024-02-26 | Disposition: A | Discharge status: Home / Self Care | Attending: Internal Medicine | Admitting: Internal Medicine

## 2024-02-23 DIAGNOSIS — I6782 Cerebral ischemia: Secondary | ICD-10-CM | POA: Diagnosis not present

## 2024-02-23 DIAGNOSIS — Z79899 Other long term (current) drug therapy: Secondary | ICD-10-CM | POA: Insufficient documentation

## 2024-02-23 DIAGNOSIS — J449 Chronic obstructive pulmonary disease, unspecified: Secondary | ICD-10-CM | POA: Insufficient documentation

## 2024-02-23 DIAGNOSIS — I495 Sick sinus syndrome: Secondary | ICD-10-CM | POA: Diagnosis not present

## 2024-02-23 DIAGNOSIS — R531 Weakness: Secondary | ICD-10-CM | POA: Diagnosis not present

## 2024-02-23 DIAGNOSIS — Z7901 Long term (current) use of anticoagulants: Secondary | ICD-10-CM | POA: Diagnosis not present

## 2024-02-23 DIAGNOSIS — E1122 Type 2 diabetes mellitus with diabetic chronic kidney disease: Secondary | ICD-10-CM | POA: Diagnosis not present

## 2024-02-23 DIAGNOSIS — N183 Chronic kidney disease, stage 3 unspecified: Secondary | ICD-10-CM | POA: Insufficient documentation

## 2024-02-23 DIAGNOSIS — I480 Paroxysmal atrial fibrillation: Secondary | ICD-10-CM | POA: Diagnosis not present

## 2024-02-23 DIAGNOSIS — E099 Drug or chemical induced diabetes mellitus without complications: Principal | ICD-10-CM | POA: Insufficient documentation

## 2024-02-23 DIAGNOSIS — R220 Localized swelling, mass and lump, head: Secondary | ICD-10-CM | POA: Diagnosis not present

## 2024-02-23 DIAGNOSIS — I2510 Atherosclerotic heart disease of native coronary artery without angina pectoris: Secondary | ICD-10-CM | POA: Insufficient documentation

## 2024-02-23 DIAGNOSIS — E1100 Type 2 diabetes mellitus with hyperosmolarity without nonketotic hyperglycemic-hyperosmolar coma (NKHHC): Secondary | ICD-10-CM | POA: Diagnosis not present

## 2024-02-23 DIAGNOSIS — R29818 Other symptoms and signs involving the nervous system: Secondary | ICD-10-CM | POA: Diagnosis not present

## 2024-02-23 DIAGNOSIS — I5042 Chronic combined systolic (congestive) and diastolic (congestive) heart failure: Secondary | ICD-10-CM | POA: Insufficient documentation

## 2024-02-23 DIAGNOSIS — R739 Hyperglycemia, unspecified: Secondary | ICD-10-CM | POA: Diagnosis not present

## 2024-02-23 DIAGNOSIS — M79604 Pain in right leg: Secondary | ICD-10-CM | POA: Diagnosis not present

## 2024-02-23 DIAGNOSIS — R4781 Slurred speech: Secondary | ICD-10-CM | POA: Diagnosis present

## 2024-02-23 DIAGNOSIS — Z87891 Personal history of nicotine dependence: Secondary | ICD-10-CM | POA: Insufficient documentation

## 2024-02-23 DIAGNOSIS — N1832 Chronic kidney disease, stage 3b: Secondary | ICD-10-CM | POA: Diagnosis not present

## 2024-02-23 DIAGNOSIS — Z95810 Presence of automatic (implantable) cardiac defibrillator: Secondary | ICD-10-CM | POA: Insufficient documentation

## 2024-02-23 DIAGNOSIS — Z743 Need for continuous supervision: Secondary | ICD-10-CM | POA: Diagnosis not present

## 2024-02-23 DIAGNOSIS — I130 Hypertensive heart and chronic kidney disease with heart failure and stage 1 through stage 4 chronic kidney disease, or unspecified chronic kidney disease: Secondary | ICD-10-CM | POA: Insufficient documentation

## 2024-02-23 DIAGNOSIS — R404 Transient alteration of awareness: Secondary | ICD-10-CM | POA: Diagnosis not present

## 2024-02-23 DIAGNOSIS — E1165 Type 2 diabetes mellitus with hyperglycemia: Secondary | ICD-10-CM | POA: Diagnosis not present

## 2024-02-23 LAB — COMPREHENSIVE METABOLIC PANEL WITH GFR
ALT: 23 U/L (ref 0–44)
AST: 18 U/L (ref 15–41)
Albumin: 3.1 g/dL — ABNORMAL LOW (ref 3.5–5.0)
Alkaline Phosphatase: 52 U/L (ref 38–126)
Anion gap: 9 (ref 5–15)
BUN: 38 mg/dL — ABNORMAL HIGH (ref 8–23)
CO2: 25 mmol/L (ref 22–32)
Calcium: 9.2 mg/dL (ref 8.9–10.3)
Chloride: 96 mmol/L — ABNORMAL LOW (ref 98–111)
Creatinine, Ser: 1.77 mg/dL — ABNORMAL HIGH (ref 0.61–1.24)
GFR, Estimated: 37 mL/min — ABNORMAL LOW (ref >60)
Glucose, Bld: 673 mg/dL (ref 70–99)
Potassium: 5.1 mmol/L (ref 3.5–5.1)
Sodium: 130 mmol/L — ABNORMAL LOW (ref 135–145)
Total Bilirubin: 1.4 mg/dL — ABNORMAL HIGH (ref 0.0–1.2)
Total Protein: 6.5 g/dL (ref 6.5–8.1)

## 2024-02-23 LAB — URINALYSIS, ROUTINE W REFLEX MICROSCOPIC
Bilirubin Urine: NEGATIVE
Glucose, UA: >=500 mg/dL — AB
Ketones, ur: NEGATIVE mg/dL
Leukocytes,Ua: NEGATIVE
Nitrite: NEGATIVE
Protein, ur: NEGATIVE mg/dL
Specific Gravity, Urine: 1.01 (ref 1.005–1.030)
pH: 6 (ref 5.0–8.0)

## 2024-02-23 LAB — CBC WITH DIFFERENTIAL/PLATELET
Abs Immature Granulocytes: 0.03 10*3/uL (ref 0.00–0.07)
Basophils Absolute: 0 10*3/uL (ref 0.0–0.1)
Basophils Relative: 0 %
Eosinophils Absolute: 0 10*3/uL (ref 0.0–0.5)
Eosinophils Relative: 0 %
HCT: 41.1 % (ref 39.0–52.0)
Hemoglobin: 14 g/dL (ref 13.0–17.0)
Immature Granulocytes: 1 %
Lymphocytes Relative: 8 %
Lymphs Abs: 0.5 10*3/uL — ABNORMAL LOW (ref 0.7–4.0)
MCH: 30.6 pg (ref 26.0–34.0)
MCHC: 34.1 g/dL (ref 30.0–36.0)
MCV: 89.9 fL (ref 80.0–100.0)
Monocytes Absolute: 0.2 10*3/uL (ref 0.1–1.0)
Monocytes Relative: 4 %
Neutro Abs: 5.2 10*3/uL (ref 1.7–7.7)
Neutrophils Relative %: 87 %
Platelets: 86 10*3/uL — ABNORMAL LOW (ref 150–400)
RBC: 4.57 MIL/uL (ref 4.22–5.81)
RDW: 12.5 % (ref 11.5–15.5)
WBC: 6 10*3/uL (ref 4.0–10.5)
nRBC: 0 % (ref 0.0–0.2)

## 2024-02-23 LAB — I-STAT VENOUS BLOOD GAS, ED
Acid-Base Excess: 1 mmol/L (ref 0.0–2.0)
Bicarbonate: 28.2 mmol/L — ABNORMAL HIGH (ref 20.0–28.0)
Calcium, Ion: 1.25 mmol/L (ref 1.15–1.40)
HCT: 42 % (ref 39.0–52.0)
Hemoglobin: 14.3 g/dL (ref 13.0–17.0)
O2 Saturation: 49 %
Potassium: 4.9 mmol/L (ref 3.5–5.1)
Sodium: 131 mmol/L — ABNORMAL LOW (ref 135–145)
TCO2: 30 mmol/L (ref 22–32)
pCO2, Ven: 56.1 mmHg (ref 44–60)
pH, Ven: 7.31 (ref 7.25–7.43)
pO2, Ven: 30 mmHg — CL (ref 32–45)

## 2024-02-23 LAB — GLUCOSE, RANDOM: Glucose, Bld: 668 mg/dL (ref 70–99)

## 2024-02-23 LAB — GLUCOSE, CAPILLARY
Glucose-Capillary: >600 mg/dL (ref 70–99)
Glucose-Capillary: >600 mg/dL (ref 70–99)
Glucose-Capillary: >600 mg/dL (ref 70–99)
Glucose-Capillary: 568 mg/dL (ref 70–99)
Glucose-Capillary: 473 mg/dL — ABNORMAL HIGH (ref 70–99)

## 2024-02-23 LAB — HEMOGLOBIN A1C
Hgb A1c MFr Bld: 9.4 % — ABNORMAL HIGH (ref 4.8–5.6)
Mean Plasma Glucose: 223.08 mg/dL

## 2024-02-23 LAB — URINALYSIS, MICROSCOPIC (REFLEX)

## 2024-02-23 LAB — CBG MONITORING, ED: Glucose-Capillary: 584 mg/dL (ref 70–99)

## 2024-02-23 MED ORDER — INSULIN ASPART 100 UNIT/ML IJ SOLN
0.0000 [IU] | INTRAMUSCULAR | Status: DC
  Administered 2024-02-23: 15 [IU] via SUBCUTANEOUS
  Administered 2024-02-24: 5 [IU] via SUBCUTANEOUS
  Administered 2024-02-24: 3 [IU] via SUBCUTANEOUS
  Administered 2024-02-24: 2 [IU] via SUBCUTANEOUS
  Administered 2024-02-24: 15 [IU] via SUBCUTANEOUS
  Administered 2024-02-25: 5 [IU] via SUBCUTANEOUS

## 2024-02-23 MED ORDER — APIXABAN 2.5 MG PO TABS
2.5000 mg | ORAL_TABLET | Freq: Two times a day (BID) | ORAL | Status: DC
  Administered 2024-02-23 – 2024-02-26 (×6): 2.5 mg via ORAL
  Filled 2024-02-23 (×6): qty 1

## 2024-02-23 MED ORDER — BRIMONIDINE TARTRATE 0.2 % OP SOLN
1.0000 [drp] | Freq: Two times a day (BID) | OPHTHALMIC | Status: DC
  Administered 2024-02-23 – 2024-02-26 (×6): 1 [drp] via OPHTHALMIC
  Filled 2024-02-23: qty 5

## 2024-02-23 MED ORDER — CYCLOSPORINE 0.05 % OP EMUL
1.0000 [drp] | Freq: Two times a day (BID) | OPHTHALMIC | Status: DC
  Administered 2024-02-23 – 2024-02-26 (×5): 1 [drp] via OPHTHALMIC
  Filled 2024-02-23 (×8): qty 30

## 2024-02-23 MED ORDER — FLUTICASONE FUROATE-VILANTEROL 100-25 MCG/ACT IN AEPB
1.0000 | INHALATION_SPRAY | Freq: Every day | RESPIRATORY_TRACT | Status: DC
  Administered 2024-02-24 – 2024-02-26 (×3): 1 via RESPIRATORY_TRACT
  Filled 2024-02-23: qty 28

## 2024-02-23 MED ORDER — INSULIN ASPART 100 UNIT/ML IJ SOLN: 0.0000 [IU] | Freq: Every day | INTRAMUSCULAR | Status: DC

## 2024-02-23 MED ORDER — INSULIN ASPART 100 UNIT/ML IJ SOLN
10.0000 [IU] | INTRAMUSCULAR | Status: AC
  Administered 2024-02-23: 10 [IU] via SUBCUTANEOUS

## 2024-02-23 MED ORDER — METOPROLOL SUCCINATE ER 50 MG PO TB24
50.0000 mg | ORAL_TABLET | Freq: Every day | ORAL | Status: DC
  Administered 2024-02-24 – 2024-02-25 (×2): 50 mg via ORAL
  Filled 2024-02-23 (×2): qty 1

## 2024-02-23 MED ORDER — INSULIN ASPART 100 UNIT/ML IJ SOLN
10.0000 [IU] | Freq: Once | INTRAMUSCULAR | Status: AC
  Administered 2024-02-23: 10 [IU] via SUBCUTANEOUS

## 2024-02-23 MED ORDER — SPIRONOLACTONE 12.5 MG HALF TABLET
12.5000 mg | ORAL_TABLET | Freq: Every day | ORAL | Status: DC
  Administered 2024-02-24 – 2024-02-25 (×2): 12.5 mg via ORAL
  Filled 2024-02-23 (×2): qty 1

## 2024-02-23 MED ORDER — NETARSUDIL-LATANOPROST 0.02-0.005 % OP SOLN
1.0000 [drp] | Freq: Every day | OPHTHALMIC | Status: DC
  Administered 2024-02-23 – 2024-02-25 (×3): 1 [drp] via OPHTHALMIC

## 2024-02-23 MED ORDER — FLUTICASONE FUROATE-VILANTEROL 100-25 MCG/ACT IN AEPB
1.0000 | INHALATION_SPRAY | Freq: Every morning | RESPIRATORY_TRACT | Status: DC
  Filled 2024-02-23: qty 28

## 2024-02-23 MED ORDER — SACUBITRIL-VALSARTAN 24-26 MG PO TABS
1.0000 | ORAL_TABLET | Freq: Two times a day (BID) | ORAL | Status: DC
  Administered 2024-02-23 – 2024-02-25 (×5): 1 via ORAL
  Filled 2024-02-23 (×6): qty 1

## 2024-02-23 MED ORDER — FUROSEMIDE 20 MG PO TABS
20.0000 mg | ORAL_TABLET | Freq: Every day | ORAL | Status: DC
Start: 2024-02-24 — End: 2024-02-25
  Administered 2024-02-24 – 2024-02-25 (×2): 20 mg via ORAL
  Filled 2024-02-23 (×2): qty 1

## 2024-02-23 MED ORDER — APIXABAN 2.5 MG PO TABS: 2.5000 mg | ORAL_TABLET | Freq: Two times a day (BID) | ORAL | Status: DC

## 2024-02-23 MED ORDER — DORZOLAMIDE HCL-TIMOLOL MAL 2-0.5 % OP SOLN
1.0000 [drp] | Freq: Two times a day (BID) | OPHTHALMIC | Status: DC
  Administered 2024-02-23 – 2024-02-26 (×6): 1 [drp] via OPHTHALMIC
  Filled 2024-02-23: qty 10

## 2024-02-23 MED ORDER — INSULIN ASPART 100 UNIT/ML IJ SOLN: 0.0000 [IU] | Freq: Three times a day (TID) | INTRAMUSCULAR | Status: DC

## 2024-02-23 NOTE — H&P (Signed)
 History and Physical    [name]:[id number] DOB: 1941-02-04 DOA: 02/23/2024  PCP: [name], MD   Chief Complaint: Weakness  HPI: [name] is a 83 y.o. male who presents to our facility with marked weakness, found to have profound hyperglycemia without known history of diabetes.  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  Patient presents to our facility with complaints of fatigue and weakness, partner indicates patient had episode of slurred speech as well during this timeframe, last known well yesterday.  Given his multiple findings hospitalist was called for admission.  ED Course: CT head negative, MRI pending  Review of Systems: As per HPI otherwise denies nausea vomiting diarrhea constipation headache fevers chills or chest pain.   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)    New onset diabetes, uncontrolled with profound hyperglycemia Hyperosmolar nonketotic state -A1c 9.4 confirming underlying diabetes -Appears to have been triggered by recent outpatient steroid bolus and taper -No insulin given in the ED, continue sliding scale insulin every 4 hours with intermittent bolus, patient is now required 40 units of NovoLog over the past few hours with moderately improving symptoms -Continue to follow closely, unclear if patient will require insulin at discharge or if he can be managed with diet and oral medications now that steroids are being held  Slurred speech, rule out CVA versus TIA -Currently at baseline, CT head unremarkable for acute findings -MRI pending, if found to be positive will discuss case with neurology as  patient does have multiple risk factors for CVA, he does however remain on Eliquis -unclear if he will be a candidate for any antiplatelet therapies  Paroxysmal A-fib, rate controlled History of sick sinus syndrome History of second-degree Mobitz type I heart block Combined systolic and diastolic heart failure, not in acute exacerbation -Currently rate controlled -continue metoprolol -Continue Eliquis -Continue Entresto -Continue diuretics with furosemide and spironolactone   COPD, not in acute exacerbation -Continue home Breo -Nebs as needed  CKD 3B -Appears to be baseline, continue diuretics as above, monitor closely, appears euvolemic  History of cataracts -Continue eyedrops as below    DVT prophylaxis: Eliquis Code Status: Full Family Communication: None present Status is: Inpatient  Dispo: The patient is from: Home              Anticipated d/c is to: Home              Anticipated d/c date is: 48 to 72 hours              Patient currently not medically stable for discharge given ongoing need for high-dose insulin close titration of hyperglycemia  Consultants:  None  Procedures:  None   Past Medical History:  Diagnosis Date   AICD (automatic cardioverter/defibrillator) present    CAD (coronary artery disease) 80% stenosis diag of the LAD, 30% in OM2 branch of LCX in 2009    a. Nonobstructive CAD by cath 11/2011 with the exception of the pre-existing diagonal branch #2 lesion.   Chronic systolic CHF (congestive heart failure) (HCC)    CKD (chronic kidney disease) stage 3, GFR 30-59 ml/min (HCC)    Colon polyp, hyperplastic    History of kidney stones    History  of radiation therapy    Left Lung- 08/17/23-09/29/23- Dr. Antony Blackbird   History of stress test 06/01/2012   Normal myocardial perfusion study. compared to the previous study there is no significant change. this is a low risk scan   Hypertension    Legally blind    "both eyes"   Myocardial infarction  [facility]) 11/22/2011   NICM (nonischemic cardiomyopathy) (HCC)    a. Remote hx of dilated NICM with EF ranging 20-45%, including normal EF by echo (55-60%) in 2014.   Peripheral arterial disease (HCC)    a. 06/2014: ABI right 0.99, left 1.2, LE dopplers revealing an occluded right posterior tibial. As symptoms were not felt r/t claudication, no further w/u at the time.   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 Requiring discontinuation of BB dose.   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    Past Surgical History:  Procedure Laterality Date   BACK SURGERY     BIV UPGRADE N/A 11/08/2019   Procedure: BIV UPGRADE;  Surgeon: Marinus Maw, MD;  Location: [facility] INVASIVE CV LAB;  Service: Cardiovascular;  Laterality: N/A;   BRONCHIAL BIOPSY  07/05/2023   Procedure: BRONCHIAL BIOPSIES;  Surgeon: Leslye Peer, MD;  Location: [facility] ENDOSCOPY;  Service: Pulmonary;;   BRONCHIAL BRUSHINGS  07/05/2023   Procedure: BRONCHIAL BRUSHINGS;  Surgeon: Leslye Peer, MD;  Location: [facility] ENDOSCOPY;  Service: Pulmonary;;   BRONCHIAL NEEDLE ASPIRATION BIOPSY  07/05/2023   Procedure: BRONCHIAL NEEDLE ASPIRATION BIOPSIES;  Surgeon: Leslye Peer, MD;  Location: [facility] ENDOSCOPY;  Service: Pulmonary;;   BRONCHIAL WASHINGS  07/05/2023   Procedure: BRONCHIAL WASHINGS;  Surgeon: Leslye Peer, MD;  Location: [facility] ENDOSCOPY;  Service: Pulmonary;;   CARDIAC CATHETERIZATION  11/2011   CARDIAC CATHETERIZATION  11/2011   didn't demonstrate high grade obstructive disease to account for his LV dysfunction.   CATARACT EXTRACTION, BILATERAL  1990's   CYSTOSCOPY     CYSTOSCOPY WITH BIOPSY Bilateral 08/03/2021   Procedure: CYSTOSCOPY WITH BLADDER BIOPSY, BILATERAL RETROGRADE PYELOGRAM;  Surgeon: Crista Elliot, MD;  Location: [facility] ORS;  Service: Urology;  Laterality: Bilateral;  REQUESTING 45 MINS   CYSTOSCOPY  WITH URETHRAL DILATATION N/A 05/04/2013   Procedure: CYSTOSCOPY WITH URETHRAL DILATATION;  Surgeon: Tia Alert, MD;  Location: [facility] NEURO ORS;  Service: Neurosurgery;  Laterality: N/A;  with insertion of foley catheter   EP study and ablation of VT  7/13   PVC focus mapped to the right coronary cusp of the aorta, limited ablation performed due to proximity of the focus to the right coronary artery   EYE SURGERY     FINE NEEDLE ASPIRATION  07/05/2023   Procedure: FINE NEEDLE ASPIRATION (FNA) LINEAR;  Surgeon: Leslye Peer, MD;  Location: [facility] ENDOSCOPY;  Service: Pulmonary;;   LEFT HEART CATH AND CORONARY ANGIOGRAPHY N/A 11/07/2019   Procedure: LEFT HEART CATH AND CORONARY ANGIOGRAPHY;  Surgeon: Iran Ouch, MD;  Location: [facility]VAS[facility] CV LAB;  Service: Cardiovascular;  Laterality: N/A;   LEFT HEART CATHETERIZATION WITH CORONARY ANGIOGRAM N/A 11/25/2011   Procedure: LEFT HEART CATHETERIZATION WITH CORONARY ANGIOGRAM;  Surgeon: Marykay Lex, MD;  Location: [facility] CATH LAB;  Service: Cardiovascular;  Laterality: N/A;   PACEMAKER IMPLANT N/A 07/12/2018   Procedure: PACEMAKER IMPLANT;  Surgeon: Thurmon Fair, MD;  Location: [facility] INVASIVE CV LAB;  Service: Cardiovascular;  Laterality:  N/A;   POSTERIOR FUSION LUMBAR SPINE  1979   ROTATOR CUFF REPAIR  2000's   left   V-TACH ABLATION N/A 06/06/2012   Procedure: V-TACH ABLATION;  Surgeon: Hillis Range, MD;  Location: [facility] CATH LAB;  Service: Cardiovascular;  Laterality: N/A;   VIDEO BRONCHOSCOPY WITH ENDOBRONCHIAL ULTRASOUND N/A 07/05/2023   Procedure: VIDEO BRONCHOSCOPY WITH ENDOBRONCHIAL ULTRASOUND;  Surgeon: Leslye Peer, MD;  Location: [facility] ENDOSCOPY;  Service: Pulmonary;  Laterality: N/A;     reports that he quit smoking about 24 years ago. His smoking use included cigarettes. He started smoking about 74 years ago. He has been exposed to tobacco smoke. He has never used smokeless tobacco. He reports that he does not drink alcohol and does not use  drugs.  Allergies  Allergen Reactions   Paclitaxel Other (See Comments) and Hypertension    Pt reported chest pain, back pain, and SOB during first infusion (see notes from 9/23). Infusion discontinued.    Allegra [Fexofenadine] Other (See Comments)    Makes him dizzy and feels like he will fall.   Sonafine [Wound Dressings] Itching    Itching and burning to skin  08/25/23    Family History  Problem Relation Age of Onset   Asthma Mother    Other Other        Unsure if any heart disease in his family.    Prior to Admission medications   Medication Sig Start Date End Date Taking? Authorizing Provider  albuterol (VENTOLIN HFA) 108 (90 Base) MCG/ACT inhaler Inhale 1-2 puffs into the lungs every 6 (six) hours as needed for wheezing or shortness of breath. Patient not taking: Reported on 02/16/2024    [provider]  apixaban (ELIQUIS) 2.5 MG TABS tablet TAKE 1 TABLET(2.5 MG) BY MOUTH TWICE DAILY 02/23/24   Croitoru, Mihai, MD  BREO ELLIPTA 100-25 MCG/INH AEPB Inhale 1 puff into the lungs every morning. 05/07/20   [provider]  brimonidine (ALPHAGAN) 0.2 % ophthalmic solution Place 1 drop into both eyes 2 (two) times daily. 03/29/23   [provider]  cycloSPORINE (RESTASIS) 0.05 % ophthalmic emulsion Place 1 drop into both eyes 2 (two) times daily.    [provider]  dorzolamide-timolol (COSOPT) 22.3-6.8 MG/ML ophthalmic solution Place 1 drop into both eyes 2 (two) times daily. 03/14/20   [provider]  ENTRESTO 24-26 MG TAKE 1 TABLET BY MOUTH TWICE DAILY 11/08/23   Lennette Bihari, MD  furosemide (LASIX) 20 MG tablet Take 1 tablet (20 mg total) by mouth See admin instructions. Take 20 mg by mouth EVERY OTHER MORNING Patient taking differently: Take 20 mg by mouth daily. 07/05/23   Leslye Peer, MD  hydrOXYzine (ATARAX) 10 MG tablet Take 1 tablet (10 mg total) by mouth at bedtime. 02/02/24   Heilingoetter, Cassandra L, PA-C  isosorbide  mononitrate (IMDUR) 30 MG 24 hr tablet Take 30 mg by mouth daily. 02/24/20   [provider]  metoprolol succinate (TOPROL-XL) 100 MG 24 hr tablet TAKE 1 TABLET(100 MG) BY MOUTH DAILY WITH OR IMMEDIATELY FOLLOWING A MEAL 10/25/23   Lennette Bihari, MD  nitroGLYCERIN (NITROSTAT) 0.4 MG SL tablet Place 1 tablet (0.4 mg total) under the tongue every 5 (five) minutes as needed for chest pain. Patient not taking: Reported on 02/16/2024 11/11/22   Croitoru, Rachelle Hora, MD  predniSONE (DELTASONE) 10 MG tablet Take 7 tablets in the morning daily (70 mg) for 1 week, followed by 5 tablets in the morning (50 mg) for  1 week, followed by 3 tablets in the morning (30 mg) daily for 1 week, followed by 1 tablet in the morning for one week, then stop 02/02/24   Heilingoetter, Cassandra L, PA-C  prochlorperazine (COMPAZINE) 10 MG tablet Take 1 tablet (10 mg total) by mouth every 6 (six) hours as needed. Patient not taking: Reported on 02/16/2024 08/03/23   Heilingoetter, Cassandra L, PA-C  ROCKLATAN 0.02-0.005 % SOLN Place 1 drop into both eyes at bedtime.    [provider]  spironolactone (ALDACTONE) 25 MG tablet Take 0.5 tablets (12.5 mg total) by mouth daily. 09/02/23   Croitoru, Rachelle Hora, MD    Physical Exam: Vitals:   02/23/24 1138 02/23/24 1140 02/23/24 1523 02/23/24 1607  BP: 131/71 131/71  (!) 144/79  Pulse: 71 71  70  Resp: 17 17  18   Temp: 97.9 F (36.6 C)  97.8 F (36.6 C) 98 F (36.7 C)  TempSrc: Oral  Oral Oral  SpO2: 99% 99%  98%  Weight:      Height:        Constitutional: NAD, calm, comfortable Vitals:   02/23/24 1138 02/23/24 1140 02/23/24 1523 02/23/24 1607  BP: 131/71 131/71  (!) 144/79  Pulse: 71 71  70  Resp: 17 17  18   Temp: 97.9 F (36.6 C)  97.8 F (36.6 C) 98 F (36.7 C)  TempSrc: Oral  Oral Oral  SpO2: 99% 99%  98%  Weight:      Height:       General:  Pleasantly resting in bed, No acute distress. HEENT:  Normocephalic atraumatic.  Sclerae nonicteric,  noninjected.  Extraocular movements intact bilaterally. Neck:  Without mass or deformity.  Trachea is midline. Lungs:  Clear to auscultate bilaterally without rhonchi, wheeze, or rales. Heart:  Regular rate and rhythm.  Without murmurs, rubs, or gallops. Abdomen:  Soft, nontender, nondistended.  Without guarding or rebound. Extremities: Without cyanosis, clubbing, edema, or obvious deformity. Vascular:  Dorsalis pedis and posterior tibial pulses palpable bilaterally. Skin:  Warm and dry, no erythema, no ulcerations.  Labs on Admission: I have personally reviewed following labs and imaging studies  CBC: Recent Labs  Lab 02/23/24 1231 02/23/24 1257  WBC 6.0  --   NEUTROABS 5.2  --   HGB 14.0 14.3  HCT 41.1 42.0  MCV 89.9  --   PLT 86*  --    Basic Metabolic Panel: Recent Labs  Lab 02/23/24 1231 02/23/24 1257 02/23/24 1554  NA 130* 131*  --   K 5.1 4.9  --   CL 96*  --   --   CO2 25  --   --   GLUCOSE 673*  --  668*  BUN 38*  --   --   CREATININE 1.77*  --   --   CALCIUM 9.2  --   --    GFR: Estimated Creatinine Clearance: 28.5 mL/min (A) (by C-G formula based on SCr of 1.77 mg/dL (H)).  Liver Function Tests: Recent Labs  Lab 02/23/24 1231  AST 18  ALT 23  ALKPHOS 52  BILITOT 1.4*  PROT 6.5  ALBUMIN 3.1*   HbA1C: Recent Labs    02/23/24 1231  HGBA1C 9.4*   CBG: Recent Labs  Lab 02/23/24 1140 02/23/24 1537 02/23/24 1538 02/23/24 1728 02/23/24 1808  GLUCAP 584* >600* >600* >600* 568*   Urine analysis:    Component Value Date/Time   COLORURINE YELLOW 02/23/2024 1145   APPEARANCEUR CLEAR 02/23/2024 1145   LABSPEC 1.010 02/23/2024 1145  PHURINE 6.0 02/23/2024 1145   GLUCOSEU >=500 (A) 02/23/2024 1145   HGBUR TRACE (A) 02/23/2024 1145   BILIRUBINUR NEGATIVE 02/23/2024 1145   KETONESUR NEGATIVE 02/23/2024 1145   PROTEINUR NEGATIVE 02/23/2024 1145   UROBILINOGEN 0.2 11/22/2011 0247   NITRITE NEGATIVE 02/23/2024 1145   LEUKOCYTESUR NEGATIVE  02/23/2024 1145    Radiological Exams on Admission: CT HEAD WO CONTRAST ( ) Result Date: 02/23/2024 CLINICAL DATA:  Altered mental status. Neuro deficit. Hyperglycemia. EXAM: CT HEAD WITHOUT CONTRAST TECHNIQUE: Contiguous axial images were obtained from the base of the skull through the vertex without intravenous contrast. RADIATION DOSE REDUCTION: This exam was performed according to the departmental dose-optimization program which includes automated exposure control, adjustment of the mA and/or kV according to patient size and/or use of iterative reconstruction technique. COMPARISON:  None Available. FINDINGS: Brain: No acute intracranial hemorrhage. No focal mass lesion. No CT evidence of acute infarction. No midline shift or mass effect. No hydrocephalus. Basilar cisterns are patent. Again demonstrated extra-axial high-density mass over the high LEFT frontal lobe measuring 14 mm on coronal image 45/5 unchanged from comparison exam. There are periventricular and subcortical white matter hypodensities. Generalized cortical atrophy. Vascular: No hyperdense vessel or unexpected calcification. Skull: Normal. Negative for fracture or focal lesion. Sinuses/Orbits: Paranasal sinuses and mastoid air cells are clear. Orbits are clear. Other: None. IMPRESSION: 1. No acute intracranial findings. 2. 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 3. Chronic microvascular ischemia and cortical atrophy. Electronically Signed   By: Genevive Bi M.D.   On: 02/23/2024 14:19    EKG: Independently reviewed.   Azucena Fallen DO [facility] For contact please use secure messenger on Epic  If 7PM-7AM, please contact night-coverage located on [email or web address]   02/23/2024, 6:43 PM

## 2024-02-23 NOTE — ED Notes (Signed)
 Dr.Pickering aware of CBG at beside.

## 2024-02-23 NOTE — ED Notes (Signed)
 Pt results of po2 flagged [name],notified at

## 2024-02-23 NOTE — Inpatient Diabetes Management (Signed)
 Inpatient Diabetes Program Recommendations  AACE/ADA: New Consensus Statement on Inpatient Glycemic Control (2015)  Target Ranges:  Prepandial:   less than 140 mg/dL      Peak postprandial:   less than 180 mg/dL (1-2 hours)      Critically ill patients:  140 - 180 mg/dL   Lab Results  Component Value Date   GLUCAP 584 (HH) 02/23/2024   HGBA1C 9.4 (H) 02/23/2024    Review of Glycemic Control  Diabetes history: None  Current orders for Inpatient glycemic control:  Novolog 0-15 units tid + hs Novolog 10 units once  Glucose on presentation 567  Inpatient Diabetes Program Recommendations:    -   Start Semglee 10 units  Will see pt this admission, needs benefits check on insulin and will assess for possible need for CGM at home.   Thanks,  Christena Deem RN, MSN, BC-ADM Inpatient Diabetes Coordinator Team Pager [phone] (8a-5p)

## 2024-02-23 NOTE — ED Provider Notes (Signed)
 [place] EMERGENCY DEPARTMENT AT [facility] Provider Note   CSN: [id number] Arrival date & time: 02/23/24  1125     History  Chief Complaint  Patient presents with   Hyperglycemia   Weakness    [name] is a 83 y.o. male.   Hyperglycemia Associated symptoms: weakness   Weakness Patient presents weakness difficulty speaking.  Reportedly has had difficulty speaking since around 6:00 last night.  However had not been seen for a while before that. Has had urinary conceive for around a week.  Found to have a sugar of 500 upon arrival.  Has been weak and dizzy.  Reportedly no history of diabetes. No headache.  No definite sick contacts.    Past Medical History:  Diagnosis Date   AICD (automatic cardioverter/defibrillator) present    CAD (coronary artery disease) 80% stenosis diag of the LAD, 30% in OM2 branch of LCX in 2009    a. Nonobstructive CAD by cath 11/2011 with the exception of the pre-existing diagonal branch #2 lesion.   Chronic systolic CHF (congestive heart failure) (HCC)    CKD (chronic kidney disease) stage 3, GFR 30-59 ml/min (HCC)    Colon polyp, hyperplastic    History of kidney stones    History of radiation therapy    Left Lung- 08/17/23-09/29/23- Dr. Antony Blackbird   History of stress test 06/01/2012   Normal myocardial perfusion study. compared to the previous study there is no significant change. this is a low risk scan   Hypertension    Legally blind    "both eyes"   Myocardial infarction [facility]) 11/22/2011   NICM (nonischemic cardiomyopathy) (HCC)    a. Remote hx of dilated NICM with EF ranging 20-45%, including normal EF by echo (55-60%) in 2014.   Peripheral arterial disease (HCC)    a. 06/2014: ABI right 0.99, left 1.2, LE dopplers revealing an occluded right posterior tibial. As symptoms were not felt r/t claudication, no further w/u at the time.   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 Requiring discontinuation of BB dose.   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   Past Surgical History:  Procedure Laterality Date   BACK SURGERY     BIV UPGRADE N/A 11/08/2019   Procedure: BIV UPGRADE;  Surgeon: Marinus Maw, MD;  Location: [facility] INVASIVE CV LAB;  Service: Cardiovascular;  Laterality: N/A;   BRONCHIAL BIOPSY  07/05/2023   Procedure: BRONCHIAL BIOPSIES;  Surgeon: Leslye Peer, MD;  Location: [facility] ENDOSCOPY;  Service: Pulmonary;;   BRONCHIAL BRUSHINGS  07/05/2023   Procedure: BRONCHIAL BRUSHINGS;  Surgeon: Leslye Peer, MD;  Location: [facility] ENDOSCOPY;  Service: Pulmonary;;   BRONCHIAL NEEDLE ASPIRATION BIOPSY  07/05/2023   Procedure: BRONCHIAL NEEDLE ASPIRATION BIOPSIES;  Surgeon: Leslye Peer, MD;  Location: [facility] ENDOSCOPY;  Service: Pulmonary;;   BRONCHIAL WASHINGS  07/05/2023   Procedure: BRONCHIAL WASHINGS;  Surgeon: Leslye Peer, MD;  Location: [facility] ENDOSCOPY;  Service: Pulmonary;;   CARDIAC CATHETERIZATION  11/2011   CARDIAC CATHETERIZATION  11/2011   didn't demonstrate high grade obstructive disease to account for his LV dysfunction.   CATARACT EXTRACTION, BILATERAL  1990's   CYSTOSCOPY     CYSTOSCOPY WITH BIOPSY Bilateral 08/03/2021   Procedure: CYSTOSCOPY WITH BLADDER BIOPSY, BILATERAL RETROGRADE PYELOGRAM;  Surgeon: Crista Elliot, MD;  Location: [facility] ORS;  Service: Urology;  Laterality: Bilateral;  REQUESTING 45 MINS   CYSTOSCOPY WITH URETHRAL DILATATION N/A 05/04/2013   Procedure: CYSTOSCOPY WITH URETHRAL DILATATION;  Surgeon: Tia Alert, MD;  Location: [facility] NEURO ORS;  Service: Neurosurgery;  Laterality: N/A;  with insertion of foley catheter   EP study and ablation of VT  7/13   PVC focus mapped to the right coronary cusp of the aorta, limited ablation performed due to proximity of the focus to the right coronary artery   EYE SURGERY     FINE NEEDLE ASPIRATION  07/05/2023    Procedure: FINE NEEDLE ASPIRATION (FNA) LINEAR;  Surgeon: Leslye Peer, MD;  Location: [facility] ENDOSCOPY;  Service: Pulmonary;;   LEFT HEART CATH AND CORONARY ANGIOGRAPHY N/A 11/07/2019   Procedure: LEFT HEART CATH AND CORONARY ANGIOGRAPHY;  Surgeon: Iran Ouch, MD;  Location: [facility] INVASIVE CV LAB;  Service: Cardiovascular;  Laterality: N/A;   LEFT HEART CATHETERIZATION WITH CORONARY ANGIOGRAM N/A 11/25/2011   Procedure: LEFT HEART CATHETERIZATION WITH CORONARY ANGIOGRAM;  Surgeon: Marykay Lex, MD;  Location: [facility] CATH LAB;  Service: Cardiovascular;  Laterality: N/A;   PACEMAKER IMPLANT N/A 07/12/2018   Procedure: PACEMAKER IMPLANT;  Surgeon: Thurmon Fair, MD;  Location: [facility] INVASIVE CV LAB;  Service: Cardiovascular;  Laterality: N/A;   POSTERIOR FUSION LUMBAR SPINE  1979   ROTATOR CUFF REPAIR  2000's   left   V-TACH ABLATION N/A 06/06/2012   Procedure: V-TACH ABLATION;  Surgeon: Hillis Range, MD;  Location: [facility] CATH LAB;  Service: Cardiovascular;  Laterality: N/A;   VIDEO BRONCHOSCOPY WITH ENDOBRONCHIAL ULTRASOUND N/A 07/05/2023   Procedure: VIDEO BRONCHOSCOPY WITH ENDOBRONCHIAL ULTRASOUND;  Surgeon: Leslye Peer, MD;  Location: [facility] ENDOSCOPY;  Service: Pulmonary;  Laterality: N/A;    Home Medications Prior to Admission medications   Medication Sig Start Date End Date Taking? Authorizing Provider  apixaban (ELIQUIS) 2.5 MG TABS tablet TAKE 1 TABLET(2.5 MG) BY MOUTH TWICE DAILY 02/23/24   Croitoru, Mihai, MD  BREO ELLIPTA 100-25 MCG/INH AEPB Inhale 1 puff into the lungs every morning. 05/07/20   [provider]  brimonidine (ALPHAGAN) 0.2 % ophthalmic solution Place 1 drop into both eyes 2 (two) times daily. 03/29/23   [provider]  cycloSPORINE (RESTASIS) 0.05 % ophthalmic emulsion Place 1 drop into both eyes 2 (two) times daily.    [provider]  dorzolamide-timolol (COSOPT) 22.3-6.8 MG/ML ophthalmic solution Place 1 drop into both eyes 2 (two) times daily.  03/14/20   [provider]  ENTRESTO 24-26 MG TAKE 1 TABLET BY MOUTH TWICE DAILY 11/08/23   Lennette Bihari, MD  furosemide (LASIX) 20 MG tablet Take 1 tablet (20 mg total) by mouth See admin instructions. Take 20 mg by mouth EVERY OTHER MORNING Patient taking differently: Take 20 mg by mouth daily. 07/05/23   Leslye Peer, MD  hydrOXYzine (ATARAX) 10 MG tablet Take 1 tablet (10 mg total) by mouth at bedtime. 02/02/24   Heilingoetter, Cassandra L, PA-C  isosorbide mononitrate (IMDUR) 30 MG 24 hr tablet Take 30 mg by mouth daily. 02/24/20   [provider]  metoprolol succinate (TOPROL-XL) 100 MG 24 hr tablet TAKE 1 TABLET(100 MG) BY MOUTH DAILY WITH OR IMMEDIATELY FOLLOWING A MEAL 10/25/23   Lennette Bihari, MD  predniSONE (DELTASONE) 10 MG tablet Take 7 tablets in the morning daily (70 mg) for 1 week, followed by 5 tablets in the morning (50 mg) for 1 week, followed by 3 tablets in the morning (30 mg) daily for 1  week, followed by 1 tablet in the morning for one week, then stop 02/02/24   Heilingoetter, Cassandra L, PA-C  ROCKLATAN 0.02-0.005 % SOLN Place 1 drop into both eyes at bedtime.    [provider]  spironolactone (ALDACTONE) 25 MG tablet Take 0.5 tablets (12.5 mg total) by mouth daily. 09/02/23   Croitoru, Rachelle Hora, MD      Allergies    Paclitaxel, Allegra [fexofenadine], and Sonafine [wound dressings]    Review of Systems   Review of Systems  Neurological:  Positive for weakness.    Physical Exam Updated Vital Signs BP 131/71   Pulse 71   Temp 97.8 F (36.6 C) (Oral)   Resp 17   Ht 5\' 7"  (1.702 m)   Wt 72 kg   SpO2 99%   BMI 24.86 kg/m  Physical Exam Vitals and nursing note reviewed.  HENT:     Head: Normocephalic.  Cardiovascular:     Rate and Rhythm: Normal rate.  Pulmonary:     Breath sounds: No wheezing.  Abdominal:     Tenderness: There is no abdominal tenderness.  Skin:    General: Skin is warm.  Neurological:     Mental Status: He is  alert.     Comments: Face symmetric.  Moves all extremities.  Somewhat inconsistent speech.  Awake and answers questions.     ED Results / Procedures / Treatments   Labs (all labs ordered are listed, but only abnormal results are displayed) Labs Reviewed  URINALYSIS, ROUTINE W REFLEX MICROSCOPIC - Abnormal; Notable for the following components:      Result Value   Glucose, UA >=500 (*)    Hgb urine dipstick TRACE (*)    All other components within normal limits  HEMOGLOBIN A1C - Abnormal; Notable for the following components:   Hgb A1c MFr Bld 9.4 (*)    All other components within normal limits  COMPREHENSIVE METABOLIC PANEL WITH GFR - Abnormal; Notable for the following components:   Sodium 130 (*)    Chloride 96 (*)    Glucose, Bld 673 (*)    BUN 38 (*)    Creatinine, Ser 1.77 (*)    Albumin 3.1 (*)    Total Bilirubin 1.4 (*)    GFR, Estimated 37 (*)    All other components within normal limits  CBC WITH DIFFERENTIAL/PLATELET - Abnormal; Notable for the following components:   Platelets 86 (*)    Lymphs Abs 0.5 (*)    All other components within normal limits  URINALYSIS, MICROSCOPIC (REFLEX) - Abnormal; Notable for the following components:   Bacteria, UA RARE (*)    All other components within normal limits  GLUCOSE, CAPILLARY - Abnormal; Notable for the following components:   Glucose-Capillary >600 (*)    All other components within normal limits  CBG MONITORING, ED - Abnormal; Notable for the following components:   Glucose-Capillary 584 (*)    All other components within normal limits  I-STAT VENOUS BLOOD GAS, ED - Abnormal; Notable for the following components:   pO2, Ven 30 (*)    Bicarbonate 28.2 (*)    Sodium 131 (*)    All other components within normal limits  CBG MONITORING, ED  CBG MONITORING, ED    EKG None  Radiology CT HEAD WO CONTRAST ( ) Result Date: 02/23/2024 CLINICAL DATA:  Altered mental status. Neuro deficit. Hyperglycemia. EXAM: CT  HEAD WITHOUT CONTRAST TECHNIQUE: Contiguous axial images were obtained from the base of the skull through the vertex without  intravenous contrast. RADIATION DOSE REDUCTION: This exam was performed according to the departmental dose-optimization program which includes automated exposure control, adjustment of the mA and/or kV according to patient size and/or use of iterative reconstruction technique. COMPARISON:  None Available. FINDINGS: Brain: No acute intracranial hemorrhage. No focal mass lesion. No CT evidence of acute infarction. No midline shift or mass effect. No hydrocephalus. Basilar cisterns are patent. Again demonstrated extra-axial high-density mass over the high LEFT frontal lobe measuring 14 mm on coronal image 45/5 unchanged from comparison exam. There are periventricular and subcortical white matter hypodensities. Generalized cortical atrophy. Vascular: No hyperdense vessel or unexpected calcification. Skull: Normal. Negative for fracture or focal lesion. Sinuses/Orbits: Paranasal sinuses and mastoid air cells are clear. Orbits are clear. Other: None. IMPRESSION: 1. No acute intracranial findings. 2. 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 3. Chronic microvascular ischemia and cortical atrophy. Electronically Signed   By: Genevive Bi M.D.   On: 02/23/2024 14:19    Procedures Procedures    Medications Ordered in ED Medications  insulin aspart (novoLOG) injection 0-5 Units (has no administration in time range)  insulin aspart (novoLOG) injection 0-15 Units (has no administration in time range)  insulin aspart (novoLOG) injection 10 Units (has no administration in time range)  apixaban (ELIQUIS) tablet 2.5 mg (has no administration in time range)  sacubitril-valsartan (ENTRESTO) 24-26 mg per tablet (has no administration in time range)  brimonidine (ALPHAGAN) 0.2 % ophthalmic solution 1 drop (has no administration in time range)   cycloSPORINE (RESTASIS) 0.05 % ophthalmic emulsion 1 drop (has no administration in time range)  dorzolamide-timolol (COSOPT) 2-0.5 % ophthalmic solution 1 drop (has no administration in time range)  Netarsudil-Latanoprost 0.02-0.005 % SOLN 1 drop (has no administration in time range)    ED Course/ Medical Decision Making/ A&P                                 Medical Decision Making Amount and/or Complexity of Data Reviewed Labs: ordered. Radiology: ordered.  Risk Decision regarding hospitalization.   Patient with hyperglycemia and some difficulty speaking.  Some dizziness.  Likely new onset diabetes.  Sugar of over 500.  Will check blood work including hemoglobin A1c.  With further review of the records it appears that around 3 weeks ago oncology started him on a steroid taper for the itching.  Was on initially 70 mg a day.  This likely helped pushing into the hyperglycemia/diabetes.  Not in DKA.  I think patient benefit for admission to the hospital for further workup of this.  More other adjustment of sugar.  Also with the difficulty speaking I think would benefit from further workup for strokelike causes.  Initial head CT reassuring and not a TNK candidate due to last normal of sometime yesterday and not LVO positive.  Sugars elevated.  Likely due to the prednisone.  Will add MRI due to difficulty speaking.  Admit to internal medicine.             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

## 2024-02-23 NOTE — ED Triage Notes (Signed)
 Patient BIB EMS from home originally c/o weakness. EMS found blood sugar to be 567. Patient has slurred speech and patient reports that their speech is different and that started last night. Patient reports generalized weakness and dizziness at 1830 last night. Patient has no history of diabetes. Patient does have a pacemaker.   124/76 HR 80 99% RA CBG 567

## 2024-02-23 NOTE — Telephone Encounter (Signed)
 Prescription refill request for Eliquis received.  Indication: afib  Last office visit: Croitoru 02/16/2024 Scr: 1.77, 02/23/2024 Age: 83 yo  Weight:  72 kg   Refill sent.

## 2024-02-23 NOTE — Plan of Care (Signed)
  Problem: Coping: Goal: Ability to adjust to condition or change in health will improve Outcome: Progressing   Problem: Fluid Volume: Goal: Ability to maintain a balanced intake and output will improve Outcome: Progressing   Problem: Health Behavior/Discharge Planning: Goal: Ability to identify and utilize available resources and services will improve Outcome: Progressing Goal: Ability to manage health-related needs will improve Outcome: Progressing   Problem: Skin Integrity: Goal: Risk for impaired skin integrity will decrease Outcome: Progressing   Problem: Tissue Perfusion: Goal: Adequacy of tissue perfusion will improve Outcome: Progressing   Problem: Education: Goal: Knowledge of General Education information will improve Description: Including pain rating scale, medication(s)/side effects and non-pharmacologic comfort measures Outcome: Progressing   Problem: Health Behavior/Discharge Planning: Goal: Ability to manage health-related needs will improve Outcome: Progressing   Problem: Clinical Measurements: Goal: Will remain free from infection Outcome: Progressing Goal: Respiratory complications will improve Outcome: Progressing Goal: Cardiovascular complication will be avoided Outcome: Progressing   Problem: Activity: Goal: Risk for activity intolerance will decrease Outcome: Progressing   Problem: Nutrition: Goal: Adequate nutrition will be maintained Outcome: Progressing   Problem: Coping: Goal: Level of anxiety will decrease Outcome: Progressing   Problem: Elimination: Goal: Will not experience complications related to urinary retention Outcome: Progressing   Problem: Pain Managment: Goal: General experience of comfort will improve and/or be controlled Outcome: Progressing   Problem: Safety: Goal: Ability to remain free from injury will improve Outcome: Progressing   Problem: Skin Integrity: Goal: Risk for impaired skin integrity will  decrease Outcome: Progressing

## 2024-02-24 ENCOUNTER — Observation Stay (HOSPITAL_COMMUNITY)

## 2024-02-24 DIAGNOSIS — R519 Headache, unspecified: Secondary | ICD-10-CM | POA: Diagnosis not present

## 2024-02-24 DIAGNOSIS — R531 Weakness: Secondary | ICD-10-CM | POA: Diagnosis not present

## 2024-02-24 DIAGNOSIS — C3490 Malignant neoplasm of unspecified part of unspecified bronchus or lung: Secondary | ICD-10-CM | POA: Diagnosis not present

## 2024-02-24 DIAGNOSIS — E1100 Type 2 diabetes mellitus with hyperosmolarity without nonketotic hyperglycemic-hyperosmolar coma (NKHHC): Secondary | ICD-10-CM | POA: Diagnosis not present

## 2024-02-24 LAB — GLUCOSE, CAPILLARY
Glucose-Capillary: 104 mg/dL — ABNORMAL HIGH (ref 70–99)
Glucose-Capillary: 141 mg/dL — ABNORMAL HIGH (ref 70–99)
Glucose-Capillary: 187 mg/dL — ABNORMAL HIGH (ref 70–99)
Glucose-Capillary: 237 mg/dL — ABNORMAL HIGH (ref 70–99)
Glucose-Capillary: 264 mg/dL — ABNORMAL HIGH (ref 70–99)
Glucose-Capillary: 376 mg/dL — ABNORMAL HIGH (ref 70–99)
Glucose-Capillary: 465 mg/dL — ABNORMAL HIGH (ref 70–99)
Glucose-Capillary: 99 mg/dL (ref 70–99)

## 2024-02-24 MED ORDER — INSULIN GLARGINE-YFGN 100 UNIT/ML ~~LOC~~ SOLN
20.0000 [IU] | Freq: Every day | SUBCUTANEOUS | Status: DC
  Administered 2024-02-24: 20 [IU] via SUBCUTANEOUS
  Filled 2024-02-24 (×2): qty 0.2

## 2024-02-24 MED ORDER — INSULIN ASPART 100 UNIT/ML IJ SOLN
15.0000 [IU] | INTRAMUSCULAR | Status: AC
  Administered 2024-02-24: 15 [IU] via SUBCUTANEOUS

## 2024-02-24 MED ORDER — LIVING WELL WITH DIABETES BOOK
Freq: Once | Status: AC
Start: 2024-02-24 — End: 2024-02-24
  Filled 2024-02-24: qty 1

## 2024-02-24 MED ORDER — INSULIN STARTER KIT- PEN NEEDLES (ENGLISH)
1.0000 | Freq: Once | Status: AC
  Administered 2024-02-24: 1
  Filled 2024-02-24: qty 1

## 2024-02-24 NOTE — Plan of Care (Signed)

## 2024-02-24 NOTE — Progress Notes (Signed)
 OT Screen Note  Patient Details Name: FINNIS COLEE MRN: [id number] DOB: 22-Feb-1941   Cancelled Treatment:    Reason Eval/Treat Not Completed: OT screened, no needs identified, will sign off Per PT, patient has no acute OT needs; OT will sign off at this time. Please re-consult if further acute needs arise.   Pollyann Glen E. Oshae Simmering, OTR/L Acute Rehabilitation Services [phone]   Cherlyn Cushing 02/24/2024, 8:04 AM

## 2024-02-24 NOTE — Inpatient Diabetes Management (Signed)
 Inpatient Diabetes Program Recommendations  AACE/ADA: New Consensus Statement on Inpatient Glycemic Control (2015)  Target Ranges:  Prepandial:   less than 140 mg/dL      Peak postprandial:   less than 180 mg/dL (1-2 hours)      Critically ill patients:  140 - 180 mg/dL   Lab Results  Component Value Date   GLUCAP 187 (H) 02/24/2024   HGBA1C 9.4 (H) 02/23/2024  Spoke with patient and wife @ bedside. Patient was not able to see well enough to do injections himself and his right hand cramps while attempting. His wife was able to do insulin pen demonstration after a couple of attempts. Novolog meal time insulin would be preferred @ mealtime but wife and patient are not able to comprehend. 70/30 insulin bid may be a better option of coverage. 70/30 23 units bid ac meals would equal 32 units basal, 13.8 units meal coverage.  Discharge Recommendations: Long acting recommendations: Insulin Glargine (LANTUS) Solostar Pen 32 units daily (call doctor if CBG <90 to discuss dose of insulin)  Hypoglycemia treatment recommendations: GVoke 1mg  Supply/Referral recommendations: Glucometer Test strips Lancet device Lancets Pen needles - standard Referral to Nutrition & Diab Services   Use Adult Diabetes Insulin Treatment Post Discharge order set.  Educated patient and spouse on insulin pen use at home. Reviewed contents of insulin flexpen starter kit. Reviewed all steps if insulin pen including attachment of needle, 2-unit air shot, dialing up dose, giving injection, removing needle, disposal of sharps, storage of unused insulin, disposal of insulin etc. Wife able to provide successful return demonstration. Also reviewed troubleshooting with insulin pen. MD to give patient Rxs for insulin pens and insulin pen needles.   Spoke with pt and wife about new diagnosis. Discussed A1C results with them and explained what an A1C is, basic pathophysiology of DM Type 2, basic home care, basic diabetes diet  nutrition principles, importance of checking CBGs and maintaining good CBG control to prevent long-term and short-term complications. Reviewed signs and symptoms of hyperglycemia and hypoglycemia and how to treat hypoglycemia at home. Also reviewed blood sugar goals at home.  RNs to provide ongoing basic DM education at bedside with this patient. Have ordered educational booklet, insulin starter kit, and DM videos. Have also placed RD consult for DM diet education for this patient.   Discussed Libre 3 CGM with patient and wife but neither are savvy with their phone and did not feel comfortable to attempt other the glucose meter.  Thank you, [name]. Brandell Maready, RN, MSN, CDCES  Diabetes Coordinator Inpatient Glycemic Control Team Team Pager [phone] (8am-5pm) 02/24/2024 1:00 PM

## 2024-02-24 NOTE — Care Management Obs Status (Signed)
 MEDICARE OBSERVATION STATUS NOTIFICATION   Patient Details  Name: [name] MRN: [id number] Date of Birth: [birth year]   Medicare Observation Status Notification Given:  Yes    Skyley Grandmaison Stefan Church 02/24/2024, 3:08 PM

## 2024-02-24 NOTE — Evaluation (Signed)
 Physical Therapy Brief Evaluation and Discharge Note Patient Details Name: [name] MRN: [id number] DOB: 03-29-1941 Today's Date: 02/24/2024   History of Present Illness  83 y.o. male admitted 02/23/24 with hyperglycemia and weakness. 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 Pt walks with a cane and able to walk to the bus stop and up stairs to his apartment. Pt moving well this date with use of cane and no further therapy recommendation at this time. Will sign off and encouraged daily mobility.       PT Assessment Patient does not need any further PT services  Assistance Needed at Discharge  PRN    Equipment Recommendations None recommended by PT  Recommendations for Other Services       Precautions/Restrictions Precautions Precautions: Fall Recall of Precautions/Restrictions: Intact        Mobility  Bed Mobility       General bed mobility comments: standing on arrival  Transfers Overall transfer level: Modified independent                      Ambulation/Gait Ambulation/Gait assistance: Modified independent (Device/Increase time) Gait Distance (Feet): 300 Feet Assistive device: Straight cane Gait Pattern/deviations: WFL(Within Functional Limits) Gait Speed: Pace WFL    Home Activity Instructions    Stairs Stairs: Yes Stairs assistance: Modified independent (Device/Increase time) Stair Management: One rail Right, Alternating pattern, Forwards Number of Stairs: 11 General stair comments: use of rail for stairs  Modified Rankin (Stroke Patients Only)        Balance Overall balance assessment: Mild deficits observed, not formally tested Sitting-balance support: No upper extremity supported, Feet supported Sitting balance-Leahy Scale: Normal Sitting balance - Comments: able to don socks in  sitting     Standing balance-Leahy Scale: Good Standing balance comment: static standing without assist, use of cane for gait          Pertinent Vitals/Pain PT - Brief Vital Signs All Vital Signs Stable: Yes Pain Assessment Pain Assessment: No/denies pain     Home Living Family/patient expects to be discharged to:: Private residence Living Arrangements: Spouse/significant other Available Help at Discharge: Family;Available 24 hours/day Home Environment: Stairs to enter  [employer] of Steps: 14 Home Equipment: [employer] (2 wheels);Cane - single point        Prior Function Level of Independence: Independent with assistive device(s) Comments: doesn't drive uses bus or church friends for transportation. walks with cane, no hx of falls    UE/LE Assessment   UE ROM/Strength/Tone/Coordination: Generalized weakness    LE ROM/Strength/Tone/Coordination: Generalized weakness      Communication   Communication Communication: Impaired Factors Affecting Communication: Reduced clarity of speech     Cognition Overall Cognitive Status: Appears within functional limits for tasks assessed/performed       General Comments      Exercises     Assessment/Plan    PT Problem List         PT Visit Diagnosis Other abnormalities of gait and mobility (R26.89)    No Skilled PT All education completed;Patient is modified independent with all activity/mobility   Co-evaluation                AMPAC 6 Clicks Help needed turning from your back to your side while in a flat bed without using bedrails?: None Help needed moving from lying  on your back to sitting on the side of a flat bed without using bedrails?: None Help needed moving to and from a bed to a chair (including a wheelchair)?: None Help needed standing up from a chair using your arms (e.g., wheelchair or bedside chair)?: None Help needed to walk in hospital room?: None Help needed climbing 3-5 steps with  a railing? : None 6 Click Score: 24      End of Session   Activity Tolerance: Patient tolerated treatment well Patient left: in chair;with call bell/phone within reach Nurse Communication: Mobility status PT Visit Diagnosis: Other abnormalities of gait and mobility (R26.89)     Time: 1610-9604 PT Time Calculation (min) (ACUTE ONLY): 16 min  Charges:   PT Evaluation $PT Eval Low Complexity: 1 Low      Cyara Devoto P, PT Acute Rehabilitation Services Office: [phone]   Enedina Finner Evyn Kooyman  02/24/2024, 9:40 AM

## 2024-02-24 NOTE — Evaluation (Addendum)
 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) 08/03/2023   Goals of care, counseling/discussion 08/03/2023   Costochondritis 04/29/2023   Abnormal CT of the chest 09/04/2020   COPD (chronic obstructive pulmonary disease) (HCC) 09/04/2020   ICD (implantable cardioverter-defibrillator) in place    Cardiac syncope 11/07/2019   Closed fracture of distal fibula 11/07/2019   Pain due to onychomycosis of toenails of both feet 05/16/2019   Coagulation disorder (HCC) 05/16/2019   Paroxysmal atrial fibrillation (HCC) 08/02/2018   NSVT (nonsustained ventricular tachycardia) (HCC) 08/02/2018   Symptomatic bradycardia 07/12/2018   Pacemaker 07/12/2018   Tachycardia-bradycardia syndrome (HCC)    Unsteady gait 03/12/2016   Nausea vomiting and diarrhea 03/11/2016   Acute lower UTI 03/11/2016   Gastroenteritis 03/11/2016   Obstipation 03/11/2016   Abdominal pain    Constipation    Lactic acidosis    Leg pain    Essential hypertension 03/04/2016   Ventricular tachycardia (paroxysmal) (HCC) 04/11/2015   Chest pain with moderate risk for cardiac etiology 04/09/2015   CAP (community acquired pneumonia) 02/18/2015   Chest pain at rest 02/18/2015   Peripheral arterial disease (HCC) 08/15/2014   Hyperlipidemia with target LDL less than 70 01/01/2014   GERD (gastroesophageal reflux disease) 01/01/2014   First degree AV block 07/31/2012   Unstable angina, negative MI 06/05/2012   Second degree AV block, Mobitz type I 05/27/2012   Chronic  kidney disease, stage 3b (HCC) 05/27/2012   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 Nonobstructive CAD by cath 11/2011 with the exception of the pre-existing diagonal branch #2 lesion.   Chronic systolic CHF (congestive heart failure) (HCC)    CKD (chronic kidney disease) stage 3, GFR 30-59 ml/min (HCC)    Colon polyp, hyperplastic    History of kidney stones    History of radiation therapy    Left Lung- 08/17/23-09/29/23- Dr. Antony Blackbird   History of stress test 06/01/2012   Normal myocardial perfusion study. compared to the previous study there is no significant change. this is a low risk scan   Hypertension    Legally blind    "both eyes"   Myocardial infarction [facility]) 11/22/2011   NICM (nonischemic cardiomyopathy) (HCC)    a. Remote hx of dilated NICM with EF ranging 20-45%, including normal EF by echo (55-60%) in 2014.   Peripheral arterial disease (HCC)    a. 06/2014: ABI right 0.99, left 1.2, LE dopplers revealing an occluded right posterior tibial. As symptoms were not felt r/t claudication, no further w/u at the time.  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 Requiring discontinuation of BB dose.   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   Past Surgical History:  Past Surgical  History:  Procedure Laterality Date   BACK SURGERY     BIV UPGRADE N/A 11/08/2019   Procedure: BIV UPGRADE;  Surgeon: Marinus Maw, MD;  Location: [facility] INVASIVE CV LAB;  Service: Cardiovascular;  Laterality: N/A;   BRONCHIAL BIOPSY  07/05/2023   Procedure: BRONCHIAL BIOPSIES;  Surgeon: Leslye Peer, MD;  Location: [facility] ENDOSCOPY;  Service: Pulmonary;;   BRONCHIAL BRUSHINGS  07/05/2023   Procedure: BRONCHIAL BRUSHINGS;  Surgeon: Leslye Peer, MD;  Location: [facility] ENDOSCOPY;  Service: Pulmonary;;   BRONCHIAL NEEDLE ASPIRATION BIOPSY  07/05/2023   Procedure: BRONCHIAL NEEDLE ASPIRATION BIOPSIES;  Surgeon: Leslye Peer, MD;  Location: [facility] ENDOSCOPY;  Service: Pulmonary;;   BRONCHIAL WASHINGS  07/05/2023   Procedure: BRONCHIAL WASHINGS;  Surgeon: Leslye Peer, MD;  Location: [facility] ENDOSCOPY;  Service: Pulmonary;;   CARDIAC CATHETERIZATION  11/2011   CARDIAC CATHETERIZATION  11/2011   didn't demonstrate high grade obstructive disease to account for his LV dysfunction.   CATARACT EXTRACTION, BILATERAL  1990's   CYSTOSCOPY     CYSTOSCOPY WITH BIOPSY Bilateral 08/03/2021   Procedure: CYSTOSCOPY WITH BLADDER BIOPSY, BILATERAL RETROGRADE PYELOGRAM;  Surgeon: Crista Elliot, MD;  Location: [facility] ORS;  Service: Urology;  Laterality: Bilateral;  REQUESTING 45 MINS   CYSTOSCOPY WITH URETHRAL DILATATION N/A 05/04/2013   Procedure: CYSTOSCOPY WITH URETHRAL DILATATION;  Surgeon: Tia Alert, MD;  Location: [facility] NEURO ORS;  Service: Neurosurgery;  Laterality: N/A;  with insertion of foley catheter   EP study and ablation of VT  7/13   PVC focus mapped to the right coronary cusp of the aorta, limited ablation performed due to proximity of the focus to the right coronary artery   EYE SURGERY     FINE NEEDLE ASPIRATION  07/05/2023   Procedure: FINE NEEDLE ASPIRATION (FNA) LINEAR;  Surgeon: Leslye Peer, MD;  Location: [facility] ENDOSCOPY;  Service: Pulmonary;;   LEFT HEART CATH AND CORONARY ANGIOGRAPHY N/A 11/07/2019    Procedure: LEFT HEART CATH AND CORONARY ANGIOGRAPHY;  Surgeon: Iran Ouch, MD;  Location: [facility] INVASIVE CV LAB;  Service: Cardiovascular;  Laterality: N/A;   LEFT HEART CATHETERIZATION WITH CORONARY ANGIOGRAM N/A 11/25/2011   Procedure: LEFT HEART CATHETERIZATION WITH CORONARY ANGIOGRAM;  Surgeon: Marykay Lex, MD;  Location: [facility] CATH LAB;  Service: Cardiovascular;  Laterality: N/A;   PACEMAKER IMPLANT N/A 07/12/2018   Procedure: PACEMAKER IMPLANT;  Surgeon: Thurmon Fair, MD;  Location: [facility] INVASIVE CV LAB;  Service: Cardiovascular;  Laterality: N/A;   POSTERIOR FUSION LUMBAR SPINE  1979   ROTATOR CUFF REPAIR  2000's   left   V-TACH ABLATION N/A 06/06/2012   Procedure: V-TACH ABLATION;  Surgeon: Hillis Range, MD;  Location: [facility] CATH LAB;  Service: Cardiovascular;  Laterality: N/A;   VIDEO BRONCHOSCOPY WITH ENDOBRONCHIAL ULTRASOUND N/A 07/05/2023   Procedure: VIDEO BRONCHOSCOPY WITH ENDOBRONCHIAL ULTRASOUND;  Surgeon: Leslye Peer, MD;  Location: [facility] ENDOSCOPY;  Service: Pulmonary;  Laterality: N/A;   HPI:  BLU LORI is a 83 y.o. male who presents to our facility with marked weakness, found to have profound hyperglycemia without known history of diabetes. Pt with episode of slurred speech CT 4/3 without acute findings.  MRI pending.  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   Assessment / Plan / Recommendation Clinical Impression  Pt presents with an expressive communication deficit characterized by articulatory imprecision, blocks, prolongations (especially of word initial vowels and fricative phonemes), occasional phoneme repetition, and hoarse/harsh vocal quality.  Pt assessed using the Western Aphasia  Battery - Bedside Form (see below for additional information). Pt reports his voice is minimally changed from baseline and notes some hoarseness.  OME grossly WFL. MRI pending.  Pt with very high blood sugar 456 during assessment.  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  Pt is aware of and bothered by changes.  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  Pt still experienced dysfluency and abnormal speech with simple song ("twinkle, twinkle little star").  Pt is able to stop and take a break and start over and this often allows him to complete his utterance.  Seemingly worse with repetition than spontaneous speech.  He was able to tell stories about his childhood, and answered pharmacist questions about home medication without difficulty, but repetition of multisyllabic words resulted in stoppage of speech.  Errors were inconsistent and OME suggests speech difficulty may not represent dysarthria.  Consider trials of stuttering strategies and/or apraxia tx pending results of instrumental to determine if these will benefit pt. Hopefully imaging may provide additional information as well.    Pt will need ST at next level of care OP v HH to address the above noted deficits.    Pt does not appear to have PT/OT needs, please ensure pt receives ST referral for next level of care if he is appropriate for d/c over the weekend.  Western Aphasia Battery - Bedside Form Content: 10 Fluency: 9 Yes/No: 10 Sequential Commands: 10 Repetition: 6.5 Naming: 9.5 Bedside Aphasia Score: 91.67/100       SLP Assessment  SLP Recommendation/Assessment: Patient needs continued Speech Lanaguage Pathology Services SLP Visit Diagnosis: Other (comment) (fluency disorder)    Recommendations for follow up therapy are one component of a multi-disciplinary discharge planning process, led by the attending physician.  Recommendations may be updated based  on patient status, additional functional criteria and insurance authorization.    Follow Up Recommendations  Outpatient SLP (or home health depending on pt's needs)    Assistance Recommended at Discharge  None  Functional Status Assessment Patient has had a recent decline in their functional status and demonstrates the ability to make significant improvements in function in a reasonable and predictable amount of time.  Frequency and Duration min 2x/week  2 weeks      SLP Evaluation Cognition  Overall Cognitive Status:  (Seemingly WFL, not formally assessed as focus of today's evaluation was speech and language)       Comprehension  Auditory Comprehension Overall Auditory Comprehension: Appears within functional limits for tasks assessed Yes/No Questions: Within Functional Limits Commands: Within Functional Limits Conversation: Complex Visual Recognition/Discrimination Discrimination: Not tested Reading Comprehension Reading Status: Not tested    Expression Expression Primary Mode of Expression: Verbal Verbal Expression Overall Verbal Expression: Impaired Repetition: Impaired Level of Impairment: Phrase level Naming: No impairment Effective Techniques:  (Taking a break and starting over)   Oral / Motor  Oral Motor/Sensory Function Overall  Oral Motor/Sensory Function: Within functional limits Motor Speech Overall Motor Speech: Impaired Respiration: Within functional limits Phonation: Hoarse Resonance: Within functional limits Articulation: Within functional limitis Intelligibility: Intelligible Motor Planning: Impaired Level of Impairment: Phrase Motor Speech Errors: Aware;Inconsistent            Kerrie Pleasure, MA, CCC-SLP Acute Rehabilitation Services Office: [phone] 02/24/2024, 10:16 AM

## 2024-02-24 NOTE — Progress Notes (Signed)
 PROGRESS NOTE    [name]  XBJ:[id number] DOB: Jan 05, 1941 DOA: 02/23/2024 PCP: [name], MD   Brief Narrative:  [name] is a 83 y.o. male who presents to our facility with marked weakness, found to have profound hyperglycemia without known history of diabetes.  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)   New onset diabetes, uncontrolled with profound hyperglycemia Hyperosmolar nonketotic state -A1c 9.4 confirming underlying diabetes -Appears to have been triggered by recent outpatient steroid bolus and taper -Patient continues to require elevated doses of insulin, despite no steroids in the last 24 hours -Add glargine, 20 units every morning -Continue moderate sliding scale, continues to require additional one-time doses on top of his acute 4-hour insulin -Patient will likely require insulin at discharge - follow for an additional 24h to ensure glycemic control and to provide diabetic education   Slurred speech CVA ruled out, possibly secondary to above -Currently at baseline, CT head unremarkable for acute findings -MRI unremarkable for acute findings   Paroxysmal A-fib, rate controlled History of sick sinus syndrome History of second-degree Mobitz type I heart block Combined systolic and diastolic heart failure, not in acute exacerbation -Currently rate controlled -continue metoprolol -Continue Eliquis -Continue Entresto -Continue diuretics with furosemide and spironolactone   COPD, not in acute exacerbation -Continue home Breo -Nebs as needed   CKD 3B -Appears to  be baseline, continue diuretics as above, monitor closely, appears euvolemic   History of cataracts -Continue eyedrops as below    DVT prophylaxis: apixaban (ELIQUIS) tablet 2.5 mg Start: 02/23/24 2000 apixaban (ELIQUIS) tablet 2.5 mg   Code Status:   Code Status: Full Code  Family Communication: None present  Status is: Observation  Dispo: The patient is from: Home              Anticipated d/c is to: Home              Anticipated d/c date is: 24 to 48 hours              Patient currently not medically stable for discharge  Consultants:  None  Procedures:  None  Antimicrobials:  None indicated  Subjective: No acute issues or events overnight, glucose continues to be elevated even off steroids, patient requesting further education in regards to diabetes given his new diagnosis which is certainly reasonable.  Objective: Vitals:   02/23/24 2015 02/24/24 0007 02/24/24 0756 02/24/24 1230  BP: 114/70 105/73 110/69 94/71  Pulse: 74 73 71 75  Resp: 20 20 17 18   Temp: 97.6 F (36.4 C) 97.6 F (36.4 C) 97.9 F (36.6 C) 98.2 F (36.8 C)  TempSrc: Oral Oral    SpO2: 100% 100% 100% 99%  Weight:      Height:        Intake/Output Summary (Last 24 hours) at 02/24/2024 1252 Last data filed at 02/24/2024 1200 Gross per 24 hour  Intake 400 ml  Output 475 ml  Net -75 ml   Filed Weights   02/23/24 1136  Weight: 72 kg    Examination:  General:  Pleasantly resting in bed, No acute distress. HEENT:  Normocephalic atraumatic.  Sclerae  nonicteric, noninjected.  Extraocular movements intact bilaterally. Neck:  Without mass or deformity.  Trachea is midline. Lungs:  Clear to auscultate bilaterally without rhonchi, wheeze, or rales. Heart:  Regular rate and rhythm.  Without murmurs, rubs, or gallops. Abdomen:  Soft, nontender, nondistended.  Without guarding or rebound. Extremities: Without cyanosis, clubbing, edema, or obvious deformity. Skin:  Warm and dry, no erythema.  Data  Reviewed: I have personally reviewed following labs and imaging studies  CBC: Recent Labs  Lab 02/23/24 1231 02/23/24 1257  WBC 6.0  --   NEUTROABS 5.2  --   HGB 14.0 14.3  HCT 41.1 42.0  MCV 89.9  --   PLT 86*  --    Basic Metabolic Panel: Recent Labs  Lab 02/23/24 1231 02/23/24 1257 02/23/24 1554  NA 130* 131*  --   K 5.1 4.9  --   CL 96*  --   --   CO2 25  --   --   GLUCOSE 673*  --  668*  BUN 38*  --   --   CREATININE 1.77*  --   --   CALCIUM 9.2  --   --    GFR: Estimated Creatinine Clearance: 28.5 mL/min (A) (by C-G formula based on SCr of 1.77 mg/dL (H)). Liver Function Tests: Recent Labs  Lab 02/23/24 1231  AST 18  ALT 23  ALKPHOS 52  BILITOT 1.4*  PROT 6.5  ALBUMIN 3.1*   HbA1C: Recent Labs    02/23/24 1231  HGBA1C 9.4*   CBG: Recent Labs  Lab 02/24/24 0442 02/24/24 0832 02/24/24 0941 02/24/24 1155 02/24/24 1231  GLUCAP 99 376* 465* 264* 187*    Radiology Studies: MR BRAIN WO CONTRAST Result Date: 02/24/2024 CLINICAL DATA:  83 year old male with headache and neurologic deficit. Weakness. Profound hyperglycemia. Non-small cell lung cancer currently on immunotherapy. EXAM: MRI HEAD WITHOUT CONTRAST TECHNIQUE: Multiplanar, multiecho pulse sequences of the brain and surrounding structures were obtained without intravenous contrast. COMPARISON:  Head CT yesterday, and earlier. FINDINGS: Brain: No convincing restricted diffusion, acute infarction. Chronic left superior vertex meningioma, 1.5-2 cm long axis and 10 mm thickness, about 5 mm larger since 2021. No significant mass effect (series 9, image 21). No associated cerebral edema. No other evidence of mass lesion. No midline shift, ventriculomegaly, or acute intracranial hemorrhage. Cervicomedullary junction and pituitary are within normal limits. Wallace Cullens and white matter signal is within normal limits for age throughout the brain. No cortical encephalomalacia or chronic cerebral blood products  identified. Vascular: Major intracranial vascular flow voids are preserved. Skull and upper cervical spine: Visualized bone marrow signal is within normal limits. Negative for age visible cervical spine. Sinuses/Orbits: Postoperative changes to both globes. Paranasal Visualized paranasal sinuses and mastoids are stable and well aerated. Other: Negative visible scalp and face. IMPRESSION: 1. No acute intracranial abnormality identified. No noncontrast MRI evidence of cerebral metastatic disease. 2. Chronic left superior vertex Meningioma, about 5 mm larger since 2021 but with no associated mass effect or edema. Electronically Signed   By: Odessa Fleming M.D.   On: 02/24/2024 11:43   CT HEAD WO CONTRAST ( ) Result Date: 02/23/2024 CLINICAL DATA:  Altered mental status. Neuro deficit. Hyperglycemia. EXAM: CT HEAD WITHOUT CONTRAST TECHNIQUE: Contiguous axial images were obtained from the base of the skull through the vertex without intravenous contrast. RADIATION DOSE REDUCTION: This exam was performed according to the departmental dose-optimization program which includes automated exposure control, adjustment of the mA and/or kV according to patient size and/or use  of iterative reconstruction technique. COMPARISON:  None Available. FINDINGS: Brain: No acute intracranial hemorrhage. No focal mass lesion. No CT evidence of acute infarction. No midline shift or mass effect. No hydrocephalus. Basilar cisterns are patent. Again demonstrated extra-axial high-density mass over the high LEFT frontal lobe measuring 14 mm on coronal image 45/5 unchanged from comparison exam. There are periventricular and subcortical white matter hypodensities. Generalized cortical atrophy. Vascular: No hyperdense vessel or unexpected calcification. Skull: Normal. Negative for fracture or focal lesion. Sinuses/Orbits: Paranasal sinuses and mastoid air cells are clear. Orbits are clear. Other: None. IMPRESSION: 1. No acute intracranial findings. 2.  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 3. Chronic microvascular ischemia and cortical atrophy. Electronically Signed   By: Genevive Bi M.D.   On: 02/23/2024 14:19        Scheduled Meds:  apixaban  2.5 mg Oral BID   brimonidine  1 drop Both Eyes BID   cycloSPORINE  1 drop Both Eyes BID   dorzolamide-timolol  1 drop Both Eyes BID   fluticasone furoate-vilanterol  1 puff Inhalation Daily   furosemide  20 mg Oral Daily   insulin aspart  0-15 Units Subcutaneous Q4H   insulin aspart  0-5 Units Subcutaneous QHS   insulin glargine-yfgn  20 Units Subcutaneous Daily   metoprolol succinate  50 mg Oral Daily   Netarsudil-Latanoprost  1 drop Both Eyes QHS   sacubitril-valsartan  1 tablet Oral BID   spironolactone  12.5 mg Oral Daily   Continuous Infusions:   LOS: 0 days    Time spent:   Azucena Fallen, DO [facility]  If 7PM-7AM, please contact night-coverage [email or web address]  02/24/2024, 12:52 PM

## 2024-02-24 NOTE — Care Management Obs Status (Signed)
 MEDICARE OBSERVATION STATUS NOTIFICATION   Patient Details  Name: [name] MRN: [id number] Date of Birth: [birth year]   Medicare Observation Status Notification Given:  Yes Moon/OBS letter signed and copy provided   Dorena Bodo 02/24/2024, 1:22 PM

## 2024-02-24 NOTE — Inpatient Diabetes Management (Addendum)
 Inpatient Diabetes Program Recommendations  AACE/ADA: New Consensus Statement on Inpatient Glycemic Control (2015)  Target Ranges:  Prepandial:   less than 140 mg/dL      Peak postprandial:   less than 180 mg/dL (1-2 hours)      Critically ill patients:  140 - 180 mg/dL   Lab Results  Component Value Date   GLUCAP 465 (H) 02/24/2024   HGBA1C 9.4 (H) 02/23/2024    Latest Reference Range & Units 02/23/24 15:37 02/23/24 15:38 02/23/24 17:28 02/23/24 18:08 02/23/24 20:17 02/24/24 00:08 02/24/24 04:42 02/24/24 08:32 02/24/24 09:41  Glucose-Capillary 70 - 99 mg/dL >578 (HH) >469 (HH) Novolog 10 units >600 (HH) Novolog 10 units 568 (HH) Novolog 10 units 473 (H) Novolog 15 units 141 (H) Novolog 2 units 99 376 (H) Novolog 10 units 465 (H)  (HH): Data is critically high (H): Data is abnormally high  Diabetes history: New Onset Current orders for Inpatient glycemic control: Semglee 20 units daily, Novolog 0-15 units q 4 hrs., Novolog 0-5 units hs  Inpatient Diabetes Program Recommendations:  Noted patient recently on steroids. Please consider: -D/C hs Novolog correction -Add Novolog 4 units tid meal coverage if eats 50%  Ordered Living Well With Diabetes book, dietician consult, insulin pen starter kit for patient. Plan to see pt. Today.  Thank you, [name]. Amilah Greenspan, RN, MSN, CDCES  Diabetes Coordinator Inpatient Glycemic Control Team Team Pager [phone] (8am-5pm) 02/24/2024 10:50 AM

## 2024-02-25 DIAGNOSIS — E1100 Type 2 diabetes mellitus with hyperosmolarity without nonketotic hyperglycemic-hyperosmolar coma (NKHHC): Secondary | ICD-10-CM | POA: Diagnosis not present

## 2024-02-25 LAB — GLUCOSE, CAPILLARY
Glucose-Capillary: 156 mg/dL — ABNORMAL HIGH (ref 70–99)
Glucose-Capillary: 163 mg/dL — ABNORMAL HIGH (ref 70–99)
Glucose-Capillary: 227 mg/dL — ABNORMAL HIGH (ref 70–99)
Glucose-Capillary: 283 mg/dL — ABNORMAL HIGH (ref 70–99)
Glucose-Capillary: 305 mg/dL — ABNORMAL HIGH (ref 70–99)
Glucose-Capillary: 325 mg/dL — ABNORMAL HIGH (ref 70–99)
Glucose-Capillary: 92 mg/dL (ref 70–99)

## 2024-02-25 MED ORDER — FUROSEMIDE 20 MG PO TABS
10.0000 mg | ORAL_TABLET | Freq: Every day | ORAL | Status: DC
  Administered 2024-02-26: 10 mg via ORAL
  Filled 2024-02-25: qty 1

## 2024-02-25 MED ORDER — INSULIN ASPART PROT & ASPART (70-30 MIX) 100 UNIT/ML ~~LOC~~ SUSP
25.0000 [IU] | Freq: Two times a day (BID) | SUBCUTANEOUS | Status: DC
  Administered 2024-02-25 (×2): 25 [IU] via SUBCUTANEOUS
  Filled 2024-02-25: qty 10

## 2024-02-25 MED ORDER — METOPROLOL SUCCINATE ER 25 MG PO TB24
12.5000 mg | ORAL_TABLET | Freq: Every day | ORAL | Status: DC
  Filled 2024-02-25: qty 1

## 2024-02-25 NOTE — Discharge Summary (Incomplete)
 Physician Discharge Summary  [name] YNW:[id number] DOB: Sep 08, 1941 DOA: 02/23/2024  PCP: [name], MD  Admit date: 02/23/2024 Discharge date: 02/25/2024  Admitted From: *** Disposition:  ***  Recommendations for Outpatient Follow-up:  Follow up with PCP in 1-2 weeks Please obtain BMP/CBC in one week Please follow up on the following pending results:  Home Health:***  Equipment/Devices:***  Discharge Condition:***  CODE STATUS:***  Diet recommendation:    Brief/Interim Summary: ***[name] is a 83 y.o. male who presents to our facility with marked weakness, found to have profound hyperglycemia without known history of diabetes.  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     Discharge Diagnoses:  Principal Problem:   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)  *** New onset diabetes, uncontrolled with profound hyperglycemia Hyperosmolar nonketotic state -A1c 9.4 confirming underlying diabetes -Appears to have been triggered by recent outpatient steroid bolus and taper -Patient continues to require elevated doses of insulin, despite no steroids in the last 24 hours -Add glargine, 20 units every morning -Continue moderate sliding scale, continues to require additional one-time doses on top of his acute 4-hour insulin -Patient will likely require insulin at discharge - follow for an additional 24h to ensure glycemic control and to provide diabetic education   Slurred speech CVA ruled out, possibly secondary to above -Currently at baseline, CT head unremarkable for acute findings -MRI unremarkable for acute findings   Paroxysmal A-fib, rate controlled History  of sick sinus syndrome History of second-degree Mobitz type I heart block Combined systolic and diastolic heart failure, not in acute exacerbation -Currently rate controlled -continue metoprolol -Continue Eliquis -Continue Entresto -Continue diuretics with furosemide and spironolactone   COPD, not in acute exacerbation -Continue home Breo -Nebs as needed   CKD 3B -Appears to be baseline, continue diuretics as above, monitor closely, appears euvolemic   History of cataracts -Continue eyedrops as below ***  Discharge Instructions   Allergies as of 02/25/2024       Reactions   Paclitaxel Other (See Comments), Hypertension   Pt reported chest pain, back pain, and SOB during first infusion (see notes from 9/23). Infusion discontinued.    Allegra [fexofenadine] Other (See Comments)   Makes him dizzy and feels like he will fall.   Sonafine [wound Dressings] Itching   Itching and burning to skin  08/25/23     Med Rec must be completed prior to using this SMART[facility]***       Allergies  Allergen Reactions   Paclitaxel Other (See Comments) and Hypertension    Pt reported chest pain, back pain, and SOB during first infusion (see notes from 9/23). Infusion discontinued.    Allegra [Fexofenadine] Other (See Comments)    Makes him dizzy and feels like he will fall.   Sonafine [Wound Dressings] Itching    Itching and burning to skin  08/25/23    Consultations: ***Specify Physician/Group   Procedures/Studies: MR BRAIN WO CONTRAST Result Date: 02/24/2024 CLINICAL DATA:  83 year old male with headache and neurologic deficit. Weakness. Profound hyperglycemia. Non-small cell lung cancer currently on immunotherapy. EXAM: MRI HEAD WITHOUT CONTRAST TECHNIQUE: Multiplanar, multiecho pulse sequences of the brain and surrounding structures were obtained without intravenous contrast. COMPARISON:  Head CT yesterday, and earlier. FINDINGS: Brain: No convincing  restricted diffusion, acute  infarction. Chronic left superior vertex meningioma, 1.5-2 cm long axis and 10 mm thickness, about 5 mm larger since 2021. No significant mass effect (series 9, image 21). No associated cerebral edema. No other evidence of mass lesion. No midline shift, ventriculomegaly, or acute intracranial hemorrhage. Cervicomedullary junction and pituitary are within normal limits. Wallace Cullens and white matter signal is within normal limits for age throughout the brain. No cortical encephalomalacia or chronic cerebral blood products identified. Vascular: Major intracranial vascular flow voids are preserved. Skull and upper cervical spine: Visualized bone marrow signal is within normal limits. Negative for age visible cervical spine. Sinuses/Orbits: Postoperative changes to both globes. Paranasal Visualized paranasal sinuses and mastoids are stable and well aerated. Other: Negative visible scalp and face. IMPRESSION: 1. No acute intracranial abnormality identified. No noncontrast MRI evidence of cerebral metastatic disease. 2. Chronic left superior vertex Meningioma, about 5 mm larger since 2021 but with no associated mass effect or edema. Electronically Signed   By: Odessa Fleming M.D.   On: 02/24/2024 11:43   CT HEAD WO CONTRAST ( ) Result Date: 02/23/2024 CLINICAL DATA:  Altered mental status. Neuro deficit. Hyperglycemia. EXAM: CT HEAD WITHOUT CONTRAST TECHNIQUE: Contiguous axial images were obtained from the base of the skull through the vertex without intravenous contrast. RADIATION DOSE REDUCTION: This exam was performed according to the departmental dose-optimization program which includes automated exposure control, adjustment of the mA and/or kV according to patient size and/or use of iterative reconstruction technique. COMPARISON:  None Available. FINDINGS: Brain: No acute intracranial hemorrhage. No focal mass lesion. No CT evidence of acute infarction. No midline shift or mass effect. No hydrocephalus. Basilar cisterns are  patent. Again demonstrated extra-axial high-density mass over the high LEFT frontal lobe measuring 14 mm on coronal image 45/5 unchanged from comparison exam. There are periventricular and subcortical white matter hypodensities. Generalized cortical atrophy. Vascular: No hyperdense vessel or unexpected calcification. Skull: Normal. Negative for fracture or focal lesion. Sinuses/Orbits: Paranasal sinuses and mastoid air cells are clear. Orbits are clear. Other: None. IMPRESSION: 1. No acute intracranial findings. 2. 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 3. Chronic microvascular ischemia and cortical atrophy. Electronically Signed   By: Genevive Bi M.D.   On: 02/23/2024 14:19     Subjective: ***   Discharge Exam: Vitals:   02/24/24 2004 02/25/24 0516  BP: (!) 91/57 102/74  Pulse: 73 86  Resp: 18 18  Temp: 97.9 F (36.6 C) (!) 97.3 F (36.3 C)  SpO2: 100% 100%   Vitals:   02/24/24 1230 02/24/24 1725 02/24/24 2004 02/25/24 0516  BP: 94/71 94/64 (!) 91/57 102/74  Pulse: 75 72 73 86  Resp: 18 17 18 18   Temp: 98.2 F (36.8 C) 98 F (36.7 C) 97.9 F (36.6 C) (!) 97.3 F (36.3 C)  TempSrc:  Oral Oral   SpO2: 99% 100% 100% 100%  Weight:      Height:        General: Pt is alert, awake, not in acute distress Cardiovascular: RRR, S1/S2 +, no rubs, no gallops Respiratory: CTA bilaterally, no wheezing, no rhonchi Abdominal: Soft, NT, ND, bowel sounds + Extremities: no edema, no cyanosis    The results of significant diagnostics from this hospitalization (including imaging, microbiology, ancillary and laboratory) are listed below for reference.     Microbiology: No results found for this or any previous visit (from the past 240 hours).   Labs: BNP (last 3 results) Recent Labs  04/29/23 1444  BNP 399.4*   Basic Metabolic Panel: Recent Labs  Lab 02/23/24 1231 02/23/24 1257 02/23/24 1554  NA 130* 131*  --   K 5.1 4.9  --    CL 96*  --   --   CO2 25  --   --   GLUCOSE 673*  --  668*  BUN 38*  --   --   CREATININE 1.77*  --   --   CALCIUM 9.2  --   --    Liver Function Tests: Recent Labs  Lab 02/23/24 1231  AST 18  ALT 23  ALKPHOS 52  BILITOT 1.4*  PROT 6.5  ALBUMIN 3.1*   No results for input(s): "LIPASE", "AMYLASE" in the last 168 hours. No results for input(s): "AMMONIA" in the last 168 hours. CBC: Recent Labs  Lab 02/23/24 1231 02/23/24 1257  WBC 6.0  --   NEUTROABS 5.2  --   HGB 14.0 14.3  HCT 41.1 42.0  MCV 89.9  --   PLT 86*  --    Cardiac Enzymes: No results for input(s): "CKTOTAL", "CKMB", "CKMBINDEX", "TROPONINI" in the last 168 hours. BNP: Invalid input(s): "POCBNP" CBG: Recent Labs  Lab 02/24/24 1231 02/24/24 1727 02/24/24 2056 02/25/24 0004 02/25/24 0518  GLUCAP 187* 104* 237* 227* 92   D-Dimer No results for input(s): "DDIMER" in the last 72 hours. Hgb A1c Recent Labs    02/23/24 1231  HGBA1C 9.4*   Lipid Profile No results for input(s): "CHOL", "HDL", "LDLCALC", "TRIG", "CHOLHDL", "LDLDIRECT" in the last 72 hours. Thyroid function studies No results for input(s): "TSH", "T4TOTAL", "T3FREE", "THYROIDAB" in the last 72 hours.  Invalid input(s): "FREET3" Anemia work up No results for input(s): "VITAMINB12", "FOLATE", "FERRITIN", "TIBC", "IRON", "RETICCTPCT" in the last 72 hours. Urinalysis    Component Value Date/Time   COLORURINE YELLOW 02/23/2024 1145   APPEARANCEUR CLEAR 02/23/2024 1145   LABSPEC 1.010 02/23/2024 1145   PHURINE 6.0 02/23/2024 1145   GLUCOSEU >=500 (A) 02/23/2024 1145   HGBUR TRACE (A) 02/23/2024 1145   BILIRUBINUR NEGATIVE 02/23/2024 1145   KETONESUR NEGATIVE 02/23/2024 1145   PROTEINUR NEGATIVE 02/23/2024 1145   UROBILINOGEN 0.2 11/22/2011 0247   NITRITE NEGATIVE 02/23/2024 1145   LEUKOCYTESUR NEGATIVE 02/23/2024 1145   Sepsis Labs Recent Labs  Lab 02/23/24 1231  WBC 6.0   Microbiology No results found for this or any  previous visit (from the past 240 hours).   Time coordinating discharge: Over 30 minutes  SIGNED:   Azucena Fallen, DO [facility] 02/25/2024, 7:01 AM Pager   If 7PM-7AM, please contact night-coverage [email or web address]

## 2024-02-25 NOTE — Progress Notes (Signed)
  RD consulted for nutrition education regarding diabetes.   Lab Results  Component Value Date   HGBA1C 9.4 (H) 02/23/2024   Pt unavailable at time of visit. Attempted to speak with pt via call to hospital room phone, however, unable to reach. RD unable to obtain further nutrition-related history or complete nutrition-focused physical exam at this time.    RD provided "Carbohydrate Counting for People with Diabetes" handout from the Academy of [employer] and Dietetics. Attached to AVS/ discharge summary. RD also referred pt to [facility]'s Nutrition and Diabetes Education Services for further support, education, and reinforcement.   Current diet order is carb modified, patient is consuming approximately n/a% of meals at this time. Labs and medications reviewed. No further nutrition interventions warranted at this time. RD contact information provided. If additional nutrition issues arise, please re-consult RD.  [name], RD, LDN, CDCES Registered Dietitian III Certified Diabetes Care and Education Specialist If unable to reach this RD, please use "RD Inpatient" group chat on secure chat between hours of 8am-4 pm daily

## 2024-02-25 NOTE — Plan of Care (Signed)

## 2024-02-25 NOTE — Discharge Instructions (Signed)

## 2024-02-25 NOTE — Progress Notes (Addendum)
   02/25/24 1129  Mobility  Activity Ambulated independently in hallway  Level of Assistance Modified independent, requires aide device or extra time  Assistive Device Cane  Distance Ambulated (ft) 50 ft  Activity Response Tolerated fair  Mobility Referral Yes  Mobility visit 1 Mobility  Mobility Specialist Start Time (ACUTE ONLY) 1112  Mobility Specialist Stop Time (ACUTE ONLY) 1129  Mobility Specialist Time Calculation (min) (ACUTE ONLY) 17 min   Mobility Specialist: Progress Note  Post-Mobility:    HR 73, SpO2 99% RA, BP 109/73 (86)  Pt agreeable to mobility session - received in chair. C/o dizziness, blurry vision,and "weak in the knees." Further mobility deferred by pt. Returned to chair with all needs met - call bell within reach.  Barnie Mort, BS Mobility Specialist Please contact via SecureChat or  Rehab office at [phone].

## 2024-02-25 NOTE — Progress Notes (Signed)
 PROGRESS NOTE    [name]  UEA:[id number] DOB: December 23, 1940 DOA: 02/23/2024 PCP: [name], MD   Brief Narrative:  [name] is a 83 y.o. male who presents to our facility with marked weakness, found to have profound hyperglycemia without known history of diabetes.  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)   New onset diabetes, uncontrolled with profound hyperglycemia Hyperosmolar nonketotic state -A1c 9.4 confirming underlying diabetes -Appears to have been triggered by recent outpatient steroid bolus and taper -Patient continues to require elevated doses of insulin, despite no steroids in the last 48 hours -Transition from glargine to 70/30 for ease of conversion at home - increase to 25u BID - titrate as appropriate   Orthostatic hypotension Slurred speech, resolved CVA ruled out, possibly secondary to above -Currently at baseline, CT head unremarkable for acute findings -MRI unremarkable for acute findings -Borderline hypotension which may explain his prior symptoms - continue entresto, lasix; metoprolol dose decreased (100 to 12.5) -Isosorbide and spironolactone stopped.   Paroxysmal A-fib, rate controlled History of sick sinus syndrome History of second-degree Mobitz type I heart block Combined systolic and diastolic heart failure, not in acute exacerbation -Currently rate controlled -continue metoprolol(at decreased dose) -Continue Eliquis -Continue Entresto -Continue diuretics with furosemide only as above   COPD, not in acute exacerbation -Continue home  Breo -Nebs as needed   CKD 3B -Appears to be baseline, continue diuretics as above, monitor closely, appears euvolemic   History of cataracts -Continue eyedrops as below    DVT prophylaxis: apixaban (ELIQUIS) tablet 2.5 mg Start: 02/23/24 2000 apixaban (ELIQUIS) tablet 2.5 mg   Code Status:   Code Status: Full Code  Family Communication: None present  Status is: Observation  Dispo: The patient is from: Home              Anticipated d/c is to: Home              Anticipated d/c date is: 24 to 48 hours              Patient currently not medically stable for discharge given ongoing uncontrolled glucose and orthostatic hypotension  Consultants:  None  Procedures:  None  Antimicrobials:  None indicated  Subjective: No acute issues or events overnight, glucose continues to be elevated even off steroids, continues to have symptoms of 'light-headedness' even at rest.  Objective: Vitals:   02/24/24 2004 02/25/24 0516 02/25/24 0810 02/25/24 0830  BP: (!) 91/57 102/74 117/76   Pulse: 73 86 87 88  Resp: 18 18 17 16   Temp: 97.9 F (36.6 C) (!) 97.3 F (36.3 C) 97.9 F (36.6 C)   TempSrc: Oral  Oral   SpO2: 100% 100% 100%   Weight:      Height:        Intake/Output Summary (Last 24 hours) at 02/25/2024 1050 Last data filed at 02/24/2024 1200 Gross per 24 hour  Intake 400 ml  Output --  Net 400 ml   Filed Weights   02/23/24 1136  Weight: 72 kg    Examination:  General:  Pleasantly sitting in bedside chair, No acute distress. HEENT:  Normocephalic atraumatic.  Sclerae nonicteric, noninjected.  Extraocular movements intact bilaterally. Neck:  Without mass or deformity.  Trachea is midline. Lungs:  Clear to auscultate bilaterally without rhonchi, wheeze, or rales. Heart:  Regular rate and rhythm.  Without murmurs, rubs, or gallops. Abdomen:  Soft, nontender, nondistended.  Without guarding or rebound. Extremities: Without cyanosis, clubbing, edema, or obvious  deformity. Skin:  Warm and dry, no erythema.  Data Reviewed: I have personally reviewed following labs and imaging studies  CBC: Recent Labs  Lab 02/23/24 1231 02/23/24 1257  WBC 6.0  --   NEUTROABS 5.2  --   HGB 14.0 14.3  HCT 41.1 42.0  MCV 89.9  --   PLT 86*  --    Basic Metabolic Panel: Recent Labs  Lab 02/23/24 1231 02/23/24 1257 02/23/24 1554  NA 130* 131*  --   K 5.1 4.9  --   CL 96*  --   --   CO2 25  --   --   GLUCOSE 673*  --  668*  BUN 38*  --   --   CREATININE 1.77*  --   --   CALCIUM 9.2  --   --    GFR: Estimated Creatinine Clearance: 28.5 mL/min (A) (by C-G formula based on SCr of 1.77 mg/dL (H)). Liver Function Tests: Recent Labs  Lab 02/23/24 1231  AST 18  ALT 23  ALKPHOS 52  BILITOT 1.4*  PROT 6.5  ALBUMIN 3.1*   HbA1C: Recent Labs    02/23/24 1231  HGBA1C 9.4*   CBG: Recent Labs  Lab 02/24/24 2056 02/25/24 0004 02/25/24 0518 02/25/24 0809 02/25/24 1030  GLUCAP 237* 227* 92 163* 325*    Radiology Studies: MR BRAIN WO CONTRAST Result Date: 02/24/2024 CLINICAL DATA:  83 year old male with headache and neurologic deficit. Weakness. Profound hyperglycemia. Non-small cell lung cancer currently on immunotherapy. EXAM: MRI HEAD WITHOUT CONTRAST TECHNIQUE: Multiplanar, multiecho pulse sequences of the brain and surrounding structures were obtained without intravenous contrast. COMPARISON:  Head CT yesterday, and earlier. FINDINGS: Brain: No convincing restricted diffusion, acute infarction. Chronic left superior vertex meningioma, 1.5-2 cm long axis and 10 mm thickness, about 5 mm larger since 2021. No significant mass effect (series 9, image 21). No associated cerebral edema. No other evidence of mass lesion. No midline shift, ventriculomegaly, or acute intracranial hemorrhage. Cervicomedullary junction and pituitary are within normal limits. Wallace Cullens and white matter signal is within normal limits for age throughout the brain. No cortical  encephalomalacia or chronic cerebral blood products identified. Vascular: Major intracranial vascular flow voids are preserved. Skull and upper cervical spine: Visualized bone marrow signal is within normal limits. Negative for age visible cervical spine. Sinuses/Orbits: Postoperative changes to both globes. Paranasal Visualized paranasal sinuses and mastoids are stable and well aerated. Other: Negative visible scalp and face. IMPRESSION: 1. No acute intracranial abnormality identified. No noncontrast MRI evidence of cerebral metastatic disease. 2. Chronic left superior vertex Meningioma, about 5 mm larger since 2021 but with no associated mass effect or edema. Electronically Signed   By: Odessa Fleming M.D.   On: 02/24/2024 11:43   CT HEAD WO CONTRAST ( ) Result Date: 02/23/2024 CLINICAL DATA:  Altered mental status. Neuro deficit. Hyperglycemia. EXAM: CT HEAD WITHOUT CONTRAST TECHNIQUE: Contiguous axial images were obtained from the base of the skull through the vertex without intravenous contrast. RADIATION DOSE REDUCTION: This exam was performed according to the departmental dose-optimization program which includes automated exposure control, adjustment of the mA and/or kV according to patient size and/or  use of iterative reconstruction technique. COMPARISON:  None Available. FINDINGS: Brain: No acute intracranial hemorrhage. No focal mass lesion. No CT evidence of acute infarction. No midline shift or mass effect. No hydrocephalus. Basilar cisterns are patent. Again demonstrated extra-axial high-density mass over the high LEFT frontal lobe measuring 14 mm on coronal image 45/5 unchanged from comparison exam. There are periventricular and subcortical white matter hypodensities. Generalized cortical atrophy. Vascular: No hyperdense vessel or unexpected calcification. Skull: Normal. Negative for fracture or focal lesion. Sinuses/Orbits: Paranasal sinuses and mastoid air cells are clear. Orbits are clear. Other: None.  IMPRESSION: 1. No acute intracranial findings. 2. 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 3. Chronic microvascular ischemia and cortical atrophy. Electronically Signed   By: Genevive Bi M.D.   On: 02/23/2024 14:19        Scheduled Meds:  apixaban  2.5 mg Oral BID   brimonidine  1 drop Both Eyes BID   cycloSPORINE  1 drop Both Eyes BID   dorzolamide-timolol  1 drop Both Eyes BID   fluticasone furoate-vilanterol  1 puff Inhalation Daily   furosemide  20 mg Oral Daily   insulin aspart protamine- aspart  25 Units Subcutaneous BID WC   metoprolol succinate  50 mg Oral Daily   Netarsudil-Latanoprost  1 drop Both Eyes QHS   sacubitril-valsartan  1 tablet Oral BID   spironolactone  12.5 mg Oral Daily   Continuous Infusions:   LOS: 0 days    Time spent:   Azucena Fallen, DO [facility]  If 7PM-7AM, please contact night-coverage [email or web address]  02/25/2024, 10:50 AM

## 2024-02-26 DIAGNOSIS — E1100 Type 2 diabetes mellitus with hyperosmolarity without nonketotic hyperglycemic-hyperosmolar coma (NKHHC): Secondary | ICD-10-CM | POA: Diagnosis not present

## 2024-02-26 LAB — GLUCOSE, CAPILLARY
Glucose-Capillary: 205 mg/dL — ABNORMAL HIGH (ref 70–99)
Glucose-Capillary: 33 mg/dL — CL (ref 70–99)
Glucose-Capillary: 339 mg/dL — ABNORMAL HIGH (ref 70–99)
Glucose-Capillary: 367 mg/dL — ABNORMAL HIGH (ref 70–99)
Glucose-Capillary: 48 mg/dL — ABNORMAL LOW (ref 70–99)
Glucose-Capillary: 56 mg/dL — ABNORMAL LOW (ref 70–99)
Glucose-Capillary: 75 mg/dL (ref 70–99)

## 2024-02-26 MED ORDER — PEN NEEDLES 31G X 5 MM MISC: 1.0000 | Freq: Three times a day (TID) | 0 refills | Status: DC

## 2024-02-26 MED ORDER — LANCET DEVICE MISC: 1.0000 | Freq: Three times a day (TID) | 0 refills | Status: DC

## 2024-02-26 MED ORDER — BLOOD GLUCOSE MONITORING SUPPL DEVI: 1.0000 | Freq: Three times a day (TID) | 0 refills | Status: DC

## 2024-02-26 MED ORDER — BLOOD GLUCOSE TEST VI STRP: 1.0000 | ORAL_STRIP | Freq: Three times a day (TID) | 0 refills | Status: DC

## 2024-02-26 MED ORDER — FUROSEMIDE 20 MG PO TABS: 10.0000 mg | ORAL_TABLET | Freq: Every day | ORAL | 1 refills | Status: DC

## 2024-02-26 MED ORDER — INSULIN ASPART PROT & ASPART (70-30 MIX) 100 UNIT/ML PEN: 25.0000 [IU] | PEN_INJECTOR | Freq: Two times a day (BID) | SUBCUTANEOUS | 1 refills | Status: DC

## 2024-02-26 MED ORDER — LANCETS MISC: 1.0000 | Freq: Three times a day (TID) | 0 refills | Status: DC

## 2024-02-26 MED ORDER — INSULIN ASPART PROT & ASPART (70-30 MIX) 100 UNIT/ML ~~LOC~~ SUSP
20.0000 [IU] | Freq: Two times a day (BID) | SUBCUTANEOUS | Status: DC
  Administered 2024-02-26: 20 [IU] via SUBCUTANEOUS

## 2024-02-26 MED ORDER — METOPROLOL SUCCINATE ER 25 MG PO TB24: 12.5000 mg | ORAL_TABLET | Freq: Every day | ORAL | 1 refills | Status: DC

## 2024-02-26 MED ORDER — GLUCAGON EMERGENCY 1 MG IJ KIT
1.0000 mg | PACK | INTRAMUSCULAR | 0 refills | Status: AC | PRN
Start: 2024-02-26 — End: ?

## 2024-02-26 MED ORDER — DEXTROSE 50 % IV SOLN
INTRAVENOUS | Status: AC
  Administered 2024-02-26: 50 mL via INTRAVENOUS
  Filled 2024-02-26: qty 50

## 2024-02-26 MED ORDER — GLUCAGON EMERGENCY 1 MG IJ KIT: 1.0000 mg | PACK | INTRAMUSCULAR | 0 refills | Status: DC | PRN

## 2024-02-26 MED ORDER — INSULIN ASPART PROT & ASPART (70-30 MIX) 100 UNIT/ML PEN
25.0000 [IU] | PEN_INJECTOR | Freq: Two times a day (BID) | SUBCUTANEOUS | 1 refills | Status: DC
Start: 2024-02-26 — End: 2024-02-26

## 2024-02-26 MED ORDER — LANCET DEVICE MISC
1.0000 | Freq: Three times a day (TID) | 0 refills | Status: DC
Start: 2024-02-26 — End: 2024-06-09

## 2024-02-26 NOTE — Progress Notes (Addendum)
   02/26/24 1017  Mobility  Activity Ambulated independently in hallway  Level of Assistance Modified independent, requires aide device or extra time  Assistive Device Cane  Distance Ambulated (ft) 75 ft  Activity Response Tolerated fair  Mobility Referral Yes  Mobility visit 1 Mobility  Mobility Specialist Start Time (ACUTE ONLY) 0947  Mobility Specialist Stop Time (ACUTE ONLY) 1017  Mobility Specialist Time Calculation (min) (ACUTE ONLY) 30 min   Mobility Specialist: Progress Note  During Mobility: HR 75, SpO2 99% RA, BP 107/59 (75) Post-Mobility:    HR 73, SpO2 100% RA, BP 110/55 (73)  Pt agreeable to mobility session - received in bed. C/o dizziness. Returned to chair with all needs met - call bell within reach.   Barnie Mort, BS Mobility Specialist Please contact via SecureChat or  Rehab office at [phone].

## 2024-02-26 NOTE — Progress Notes (Signed)
 Reviewed AVS, patient expressed understanding of medications, MD follow up reviewed.   Removed IV, Site clean, dry and intact.  Patient states all belongings brought to the hospital at time of admission are accounted for and packed to take home.  Explained to patient where to picked up medications; patient expressed understanding. Pt transported to Discharge lounge to wait for transportation home.

## 2024-02-26 NOTE — Discharge Summary (Signed)
 Physician Discharge Summary  [name]:[id number] DOB: [birth year] DOA: 02/23/2024  PCP: [name], MD  Admit date: 02/23/2024 Discharge date: 02/26/2024  Admitted From: Home Disposition: Home  Recommendations for Outpatient Follow-up:  Follow up with PCP in 1-2 weeks  Home Health: None Equipment/Devices: None  Discharge Condition: Stable CODE STATUS: Full Diet recommendation: Low-salt low-carb low-fat diet  Brief/Interim Summary: [name] is a 83 y.o. male who presents to our facility with marked weakness, found to have profound hyperglycemia without known history of diabetes.  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   Patient admitted as above with vague symptoms noted to have profoundly elevated glucose, new diagnosis of insulin-dependent diabetes with HNKS -patient initiated on sliding scale ultimately transitions to 70/30 for ease of administration, while patient has difficulty managing the insulin pen and glucometer his wife has been educated and is agreeable to manage this at home.  We discussed diabetic diet at length.  We also recommend nighttime snack due to his early morning hypoglycemia, also discussed medications that would affect his sugar including but not limited to steroids given recent issues.  He is otherwise stable and agreeable for discharge home.  Of note during hospitalization patient had notable hypotension worse with orthostatic changes, as such his spironolactone and isosorbide have been discontinued, his Lasix and metoprolol have been decreased in dose but he does continue on Entresto to ensure patient is compliant with core measures.  He may need further evaluation by PCP or  cardiology to further evaluate and titrate his medications as appropriate.  Discussed checking his glucose at home ACHS as well as at least once daily blood pressure(or if he feels symptomatic) to ensure no further hypotension moving forward.   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 Infusion discontinued.    Allegra [fexofenadine] Other (See Comments)   Makes him dizzy and feels like he will fall.   Sonafine [wound Dressings] Itching   Itching and burning to skin  08/25/23        Medication List     STOP taking these medications    isosorbide mononitrate 30 MG 24 hr tablet Commonly known as: IMDUR   spironolactone 25 MG tablet Commonly known as: ALDACTONE       TAKE these medications    Blood Glucose Monitoring Suppl Devi 1 each by Does not apply route 3 (three) times daily. May dispense any manufacturer covered by patient's insurance.   BLOOD GLUCOSE TEST STRIPS Strp 1 each by Does not apply route 3 (three) times daily. Use as directed to check blood sugar. May dispense any manufacturer covered by patient's insurance and fits patient's device.  Breo Ellipta 100-25 MCG/INH Aepb Generic drug: fluticasone furoate-vilanterol Inhale 1 puff into the lungs every morning.   brimonidine 0.2 % ophthalmic solution Commonly known as: ALPHAGAN Place 1 drop into both eyes 2 (two) times daily.   cycloSPORINE 0.05 % ophthalmic emulsion Commonly  known as: RESTASIS Place 1 drop into both eyes 2 (two) times daily.   dorzolamide-timolol 2-0.5 % ophthalmic solution Commonly known as: COSOPT Place 1 drop into both eyes 2 (two) times daily.   Eliquis 2.5 MG Tabs tablet Generic drug: apixaban TAKE 1 TABLET(2.5 MG) BY MOUTH TWICE DAILY What changed: See the new instructions.   Entresto 24-26 MG Generic drug: sacubitril-valsartan TAKE 1 TABLET BY MOUTH TWICE DAILY   furosemide 20 MG tablet Commonly known as: LASIX Take 0.5 tablets (10 mg total) by mouth daily. Start taking on: February 27, 2024 What changed:  how much to take when to take this additional instructions   Glucagon Emergency 1 MG Kit Inject 1 mg into the skin as needed for up to 2 doses (Severe low blood sugar).   hydrOXYzine 10 MG tablet Commonly known as: ATARAX Take 1 tablet (10 mg total) by mouth at bedtime.   insulin aspart protamine - aspart (70-30) 100 UNIT/ML FlexPen Commonly known as: NOVOLOG 70/30 MIX Inject 25 Units into the skin 2 (two) times daily with a meal.   Lancet Device Misc 1 each by Does not apply route 3 (three) times daily. May dispense any manufacturer covered by patient's insurance.   Lancets Misc 1 each by Does not apply route 3 (three) times daily. Use as directed to check blood sugar. May dispense any manufacturer covered by patient's insurance and fits patient's device.   metoprolol succinate 25 MG 24 hr tablet Commonly known as: TOPROL-XL Take 0.5 tablets (12.5 mg total) by mouth daily. What changed:  medication strength See the new instructions.   Pen Needles 31G X 5 MM Misc 1 each by Does not apply route 3 (three) times daily. May dispense any manufacturer covered by patient's insurance.   Rocklatan 0.02-0.005 % Soln Generic drug: Netarsudil-Latanoprost Place 1 drop into both eyes at bedtime.        Allergies  Allergen Reactions   Paclitaxel Other (See Comments) and Hypertension    Pt reported chest pain, back  pain, and SOB during first infusion (see notes from 9/23). Infusion discontinued.    Allegra [Fexofenadine] Other (See Comments)    Makes him dizzy and feels like he will fall.   Sonafine [Wound Dressings] Itching    Itching and burning to skin  08/25/23    Consultations: None  Procedures/Studies: MR BRAIN WO CONTRAST Result Date: 02/24/2024 CLINICAL DATA:  83 year old male with headache and neurologic deficit. Weakness. Profound hyperglycemia. Non-small cell lung cancer currently on immunotherapy. EXAM: MRI HEAD WITHOUT CONTRAST TECHNIQUE: Multiplanar, multiecho pulse sequences of the brain and surrounding structures were obtained without intravenous contrast. COMPARISON:  Head CT yesterday, and earlier. FINDINGS: Brain: No convincing restricted diffusion, acute infarction. Chronic left superior vertex meningioma, 1.5-2 cm long axis and 10 mm thickness, about 5 mm larger since 2021. No significant mass effect (series 9, image 21). No associated cerebral edema. No other evidence of mass lesion. No midline shift, ventriculomegaly, or acute intracranial hemorrhage. Cervicomedullary junction and pituitary are within normal limits. Wallace Cullens and white matter signal is within normal limits for age throughout the brain. No cortical encephalomalacia or chronic cerebral blood products identified. Vascular: Major intracranial vascular flow voids are preserved. Skull and upper cervical  spine: Visualized bone marrow signal is within normal limits. Negative for age visible cervical spine. Sinuses/Orbits: Postoperative changes to both globes. Paranasal Visualized paranasal sinuses and mastoids are stable and well aerated. Other: Negative visible scalp and face. IMPRESSION: 1. No acute intracranial abnormality identified. No noncontrast MRI evidence of cerebral metastatic disease. 2. Chronic left superior vertex Meningioma, about 5 mm larger since 2021 but with no associated mass effect or edema. Electronically Signed    By: Odessa Fleming M.D.   On: 02/24/2024 11:43   CT HEAD WO CONTRAST ( ) Result Date: 02/23/2024 CLINICAL DATA:  Altered mental status. Neuro deficit. Hyperglycemia. EXAM: CT HEAD WITHOUT CONTRAST TECHNIQUE: Contiguous axial images were obtained from the base of the skull through the vertex without intravenous contrast. RADIATION DOSE REDUCTION: This exam was performed according to the departmental dose-optimization program which includes automated exposure control, adjustment of the mA and/or kV according to patient size and/or use of iterative reconstruction technique. COMPARISON:  None Available. FINDINGS: Brain: No acute intracranial hemorrhage. No focal mass lesion. No CT evidence of acute infarction. No midline shift or mass effect. No hydrocephalus. Basilar cisterns are patent. Again demonstrated extra-axial high-density mass over the high LEFT frontal lobe measuring 14 mm on coronal image 45/5 unchanged from comparison exam. There are periventricular and subcortical white matter hypodensities. Generalized cortical atrophy. Vascular: No hyperdense vessel or unexpected calcification. Skull: Normal. Negative for fracture or focal lesion. Sinuses/Orbits: Paranasal sinuses and mastoid air cells are clear. Orbits are clear. Other: None. IMPRESSION: 1. No acute intracranial findings. 2. 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 3. Chronic microvascular ischemia and cortical atrophy. Electronically Signed   By: Genevive Bi M.D.   On: 02/23/2024 14:19     Subjective: Hypoglycemia noted early this morning, resolved with p.o. intake   Discharge Exam: Vitals:   02/26/24 0436 02/26/24 0813  BP: 133/70 (!) 91/53  Pulse: 90 73  Resp: 19 17  Temp: (!) 97.3 F (36.3 C) 98.2 F (36.8 C)  SpO2: 99% 99%   Vitals:   02/25/24 2016 02/26/24 0020 02/26/24 0436 02/26/24 0813  BP: (!) 99/59 (!) 97/55 133/70 (!) 91/53  Pulse: 74 73 90 73  Resp: 20 20 19 17   Temp: 98.1  F (36.7 C) 97.7 F (36.5 C) (!) 97.3 F (36.3 C) 98.2 F (36.8 C)  TempSrc: Oral Oral Oral Oral  SpO2: 100% 99% 99% 99%  Weight:      Height:        General: Pt is alert, awake, not in acute distress Cardiovascular: RRR, S1/S2 +, no rubs, no gallops Respiratory: CTA bilaterally, no wheezing, no rhonchi Abdominal: Soft, NT, ND, bowel sounds + Extremities: no edema, no cyanosis    The results of significant diagnostics from this hospitalization (including imaging, microbiology, ancillary and laboratory) are listed below for reference.     Microbiology: No results found for this or any previous visit (from the past 240 hours).   Labs: BNP (last 3 results) Recent Labs    04/29/23 1444  BNP 399.4*   Basic Metabolic Panel: Recent Labs  Lab 02/23/24 1231 02/23/24 1257 02/23/24 1554  NA 130* 131*  --   K 5.1 4.9  --   CL 96*  --   --   CO2 25  --   --   GLUCOSE 673*  --  668*  BUN 38*  --   --   CREATININE 1.77*  --   --   CALCIUM  9.2  --   --    Liver Function Tests: Recent Labs  Lab 02/23/24 1231  AST 18  ALT 23  ALKPHOS 52  BILITOT 1.4*  PROT 6.5  ALBUMIN 3.1*   No results for input(s): "LIPASE", "AMYLASE" in the last 168 hours. No results for input(s): "AMMONIA" in the last 168 hours. CBC: Recent Labs  Lab 02/23/24 1231 02/23/24 1257  WBC 6.0  --   NEUTROABS 5.2  --   HGB 14.0 14.3  HCT 41.1 42.0  MCV 89.9  --   PLT 86*  --    Cardiac Enzymes: No results for input(s): "CKTOTAL", "CKMB", "CKMBINDEX", "TROPONINI" in the last 168 hours. BNP: Invalid input(s): "POCBNP" CBG: Recent Labs  Lab 02/26/24 0450 02/26/24 0506 02/26/24 0539 02/26/24 0814 02/26/24 0954  GLUCAP 48* 56* 205* 367* 339*   D-Dimer No results for input(s): "DDIMER" in the last 72 hours. Hgb A1c No results for input(s): "HGBA1C" in the last 72 hours.  Lipid Profile No results for input(s): "CHOL", "HDL", "LDLCALC", "TRIG", "CHOLHDL", "LDLDIRECT" in the last 72  hours. Thyroid function studies No results for input(s): "TSH", "T4TOTAL", "T3FREE", "THYROIDAB" in the last 72 hours.  Invalid input(s): "FREET3" Anemia work up No results for input(s): "VITAMINB12", "FOLATE", "FERRITIN", "TIBC", "IRON", "RETICCTPCT" in the last 72 hours. Urinalysis    Component Value Date/Time   COLORURINE YELLOW 02/23/2024 1145   APPEARANCEUR CLEAR 02/23/2024 1145   LABSPEC 1.010 02/23/2024 1145   PHURINE 6.0 02/23/2024 1145   GLUCOSEU >=500 (A) 02/23/2024 1145   HGBUR TRACE (A) 02/23/2024 1145   BILIRUBINUR NEGATIVE 02/23/2024 1145   KETONESUR NEGATIVE 02/23/2024 1145   PROTEINUR NEGATIVE 02/23/2024 1145   UROBILINOGEN 0.2 11/22/2011 0247   NITRITE NEGATIVE 02/23/2024 1145   LEUKOCYTESUR NEGATIVE 02/23/2024 1145   Sepsis Labs Recent Labs  Lab 02/23/24 1231  WBC 6.0   Microbiology No results found for this or any previous visit (from the past 240 hours).   Time coordinating discharge: Over 30 minutes  SIGNED:   Azucena Fallen, DO [facility] 02/26/2024, 12:52 PM Pager   If 7PM-7AM, please contact night-coverage [email or web address]

## 2024-02-26 NOTE — Plan of Care (Signed)

## 2024-02-27 ENCOUNTER — Encounter: Payer: Self-pay | Admitting: Internal Medicine

## 2024-02-27 ENCOUNTER — Other Ambulatory Visit: Payer: Self-pay

## 2024-02-27 ENCOUNTER — Emergency Department (HOSPITAL_COMMUNITY)
Admission: EM | Admit: 2024-02-27 | Discharge: 2024-02-27 | Disposition: A | Discharge status: Home / Self Care | Attending: Emergency Medicine | Admitting: Emergency Medicine

## 2024-02-27 ENCOUNTER — Other Ambulatory Visit: Payer: Self-pay | Admitting: Internal Medicine

## 2024-02-27 ENCOUNTER — Ambulatory Visit (HOSPITAL_COMMUNITY)

## 2024-02-27 DIAGNOSIS — Z794 Long term (current) use of insulin: Secondary | ICD-10-CM | POA: Insufficient documentation

## 2024-02-27 DIAGNOSIS — E119 Type 2 diabetes mellitus without complications: Secondary | ICD-10-CM | POA: Insufficient documentation

## 2024-02-27 DIAGNOSIS — Z7901 Long term (current) use of anticoagulants: Secondary | ICD-10-CM | POA: Diagnosis not present

## 2024-02-27 DIAGNOSIS — E1165 Type 2 diabetes mellitus with hyperglycemia: Secondary | ICD-10-CM | POA: Diagnosis not present

## 2024-02-27 DIAGNOSIS — I959 Hypotension, unspecified: Secondary | ICD-10-CM | POA: Diagnosis not present

## 2024-02-27 MED ORDER — INSULIN ISOPHANE & REGULAR (HUMAN 70-30)100 UNIT/ML KWIKPEN: 25.0000 [IU] | PEN_INJECTOR | Freq: Two times a day (BID) | SUBCUTANEOUS | 0 refills | Status: AC

## 2024-02-27 NOTE — ED Provider Notes (Signed)
 [facility] AT [facility] Provider Note   CSN: [id number] Arrival date & time: 02/27/24  2045     History  Chief Complaint  Patient presents with   Medication teaching    Medication teaching    ZIMERE DUNLEVY is a 83 y.o. male.  With a history of type 2 diabetes who presents to the ED for a medication question.  Patient was discharged from inpatient hospitalization yesterday.  Both he and his family are unclear about the administration instructions for the insulin pen that was prescribed.  They came here today for clarification.  No systemic complaints at this time  HPI     Home Medications Prior to Admission medications   Medication Sig Start Date End Date Taking? Authorizing Provider  apixaban (ELIQUIS) 2.5 MG TABS tablet TAKE 1 TABLET(2.5 MG) BY MOUTH TWICE DAILY 02/23/24   Croitoru, Mihai, MD  Blood Glucose Monitoring Suppl DEVI 1 each by Does not apply route 3 (three) times daily. May dispense any manufacturer covered by patient's insurance. 02/26/24   Azucena Fallen, MD  BREO ELLIPTA 100-25 MCG/INH AEPB Inhale 1 puff into the lungs every morning. 05/07/20   [provider]  brimonidine (ALPHAGAN) 0.2 % ophthalmic solution Place 1 drop into both eyes 2 (two) times daily. 03/29/23   [provider]  cycloSPORINE (RESTASIS) 0.05 % ophthalmic emulsion Place 1 drop into both eyes 2 (two) times daily.    [provider]  dorzolamide-timolol (COSOPT) 22.3-6.8 MG/ML ophthalmic solution Place 1 drop into both eyes 2 (two) times daily. 03/14/20   [provider]  ENTRESTO 24-26 MG TAKE 1 TABLET BY MOUTH TWICE DAILY 11/08/23   Lennette Bihari, MD  furosemide (LASIX) 20 MG tablet Take 0.5 tablets (10 mg total) by mouth daily. 02/27/24   Azucena Fallen, MD  Glucagon, rDNA, (GLUCAGON EMERGENCY) 1 MG KIT Inject 1 mg into the skin as needed for up to 2 doses (Severe low blood sugar). 02/26/24   Azucena Fallen, MD   Glucose Blood (BLOOD GLUCOSE TEST STRIPS) STRP 1 each by Does not apply route 3 (three) times daily. Use as directed to check blood sugar. May dispense any manufacturer covered by patient's insurance and fits patient's device. 02/26/24   Azucena Fallen, MD  hydrOXYzine (ATARAX) 10 MG tablet Take 1 tablet (10 mg total) by mouth at bedtime. 02/02/24   Heilingoetter, Cassandra L, PA-C  insulin isophane & regular human KwikPen (HUMULIN 70/30 MIX) (70-30) 100 UNIT/ML KwikPen Inject 25 Units into the skin in the morning and at bedtime. 02/27/24   Azucena Fallen, MD  Insulin Pen Needle (PEN NEEDLES) 31G X 5 MM MISC 1 each by Does not apply route 3 (three) times daily. May dispense any manufacturer covered by patient's insurance. 02/26/24   Azucena Fallen, MD  Lancet Device MISC 1 each by Does not apply route 3 (three) times daily. May dispense any manufacturer covered by patient's insurance. 02/26/24   Azucena Fallen, MD  Lancets MISC 1 each by Does not apply route 3 (three) times daily. Use as directed to check blood sugar. May dispense any manufacturer covered by patient's insurance and fits patient's device. 02/26/24   Azucena Fallen, MD  metoprolol succinate (TOPROL-XL) 25 MG 24 hr tablet Take 0.5 tablets (12.5 mg total) by mouth daily. 02/26/24   Azucena Fallen, MD  ROCKLATAN 0.02-0.005 % SOLN Place 1 drop into both eyes at bedtime.    [provider]      Allergies    Paclitaxel, Allegra [fexofenadine], and Sonafine [wound dressings]    Review of Systems   Review of Systems  Physical Exam Updated Vital Signs BP (!) 107/55 (BP Location: Left Arm)   Pulse 72   Temp 98.4 F (36.9 C) (Oral)   Resp 16   Ht 5\' 7"  (1.702 m)   Wt 73 kg   SpO2 100%   BMI 25.22 kg/m  Physical Exam Vitals and nursing note reviewed.  HENT:     Head: Normocephalic and atraumatic.  Eyes:     Pupils: Pupils are equal, round, and reactive to light.  Cardiovascular:     Rate and  Rhythm: Normal rate and regular rhythm.  Pulmonary:     Effort: Pulmonary effort is normal.     Breath sounds: Normal breath sounds.  Abdominal:     Palpations: Abdomen is soft.     Tenderness: There is no abdominal tenderness.  Skin:    General: Skin is warm and dry.  Neurological:     Mental Status: He is alert.  Psychiatric:        Mood and Affect: Mood normal.     ED Results / Procedures / Treatments   Labs (all labs ordered are listed, but only abnormal results are displayed) Labs Reviewed - No data to display  EKG None  Radiology No results found.  Procedures Procedures    Medications Ordered in ED Medications - No data to display  ED Course/ Medical Decision Making/ A&P                                 Medical Decision Making 83 year old male with history as above presenting for question regarding short acting insulin that was prescribed upon discharge from the hospital yesterday.  Insulin KwikPen 25 units twice daily in the morning and at bedtime with meals.  Reviewed medication dosing with the patient and his family members.  He will take 25 units tomorrow morning with breakfast and follow-up with his primary care doctor for additional teaching as scheduled tomorrow afternoon.           Final Clinical Impression(s) / ED Diagnoses Final diagnoses:  Type 2 diabetes mellitus without complication, with long-term current use of insulin [facility])    Rx / DC Orders ED Discharge Orders     None         Royanne Foots, DO 02/27/24 2207

## 2024-02-27 NOTE — Discharge Instructions (Signed)
 You were seen in the emergency department for clarification on insulin dosing Give Elvin 25 units of insulin Kwikpen tomorrow morning with his breakfast.  Follow-up as scheduled with your primary care doctor to discuss dosing and to have any other questions answered

## 2024-02-27 NOTE — ED Triage Notes (Signed)
 Patient brought in by EMS from due to needing some medication teaching. Family states patient was discharged yesterday and today they went to give patient some insulin and did not know how to use the insulin pen. Family did not know if they had gave the insulin or not.

## 2024-02-28 ENCOUNTER — Telehealth: Payer: Self-pay | Admitting: Medical Oncology

## 2024-02-28 DIAGNOSIS — R4789 Other speech disturbances: Secondary | ICD-10-CM | POA: Diagnosis not present

## 2024-02-28 DIAGNOSIS — E1169 Type 2 diabetes mellitus with other specified complication: Secondary | ICD-10-CM | POA: Diagnosis not present

## 2024-02-28 DIAGNOSIS — I10 Essential (primary) hypertension: Secondary | ICD-10-CM | POA: Diagnosis not present

## 2024-02-28 NOTE — Telephone Encounter (Signed)
 I told [name] , his spouse , that pt appt this week need to be r/s to next week. She voiced understanding.

## 2024-02-29 ENCOUNTER — Telehealth: Payer: Self-pay

## 2024-02-29 DIAGNOSIS — E1169 Type 2 diabetes mellitus with other specified complication: Secondary | ICD-10-CM | POA: Diagnosis not present

## 2024-02-29 NOTE — Telephone Encounter (Signed)
 Tried to reach patient in regards to CT Chest expected for next week and is not scheduled yet.  LVM informing patient of radiology scheduling number at [phone].

## 2024-03-01 ENCOUNTER — Other Ambulatory Visit

## 2024-03-01 ENCOUNTER — Ambulatory Visit

## 2024-03-01 ENCOUNTER — Ambulatory Visit: Admitting: Internal Medicine

## 2024-03-02 ENCOUNTER — Telehealth: Payer: Self-pay | Admitting: Medical Oncology

## 2024-03-02 NOTE — Telephone Encounter (Signed)
 Scan/F/U- Pt stated he does not want to do a scan now and he does not want anymore treatment. His wife is at [facility] after GI surgery and will be there another week. He has to have someone with him all the time due to his diabetes and has to keep insulin with him all the time.     He is going to talk to Dr. Parke Simmers next week and decide about next steps. I told him to call back when he wants to make the scan and f/u appt. He said he would.

## 2024-03-02 NOTE — Telephone Encounter (Signed)
 LVM at home with instructions to call CS ( number given ) to schedule his scan on 04/15. Schedule message sent to [facility] to schedule lab and f/u appts.

## 2024-03-05 ENCOUNTER — Encounter: Payer: Self-pay | Admitting: Dietician

## 2024-03-05 ENCOUNTER — Encounter: Attending: Internal Medicine | Admitting: Dietician

## 2024-03-05 ENCOUNTER — Ambulatory Visit: Admitting: Dietician

## 2024-03-05 VITALS — Ht 67.0 in | Wt 156.0 lb

## 2024-03-05 DIAGNOSIS — E118 Type 2 diabetes mellitus with unspecified complications: Secondary | ICD-10-CM | POA: Insufficient documentation

## 2024-03-05 NOTE — Progress Notes (Signed)
 Medical Nutrition Therapy  Appointment Start time:  1000  Appointment End time:  1112 Patient is here today with his niece.  His wife is currently in the hospital after resection of tumor from colon.  Primary concerns today:  He would like to learn how to keep control of his blood glucose.  He has not been giving his own insulin injections.  A neighbor and another relative have been giving the shots currently but usually his wife gives the shots.  He can't seen the numbers on the insulin pen. 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 Much improved from >250 on 02/29/2024. App was put on Niece's phone as patient's phone doesn't support the app.  She did not realize until recently and they have left a message with the MD to obtain a receiver for patient.  CGM Results from download: 03/05/2024  % Time CGM active:  34%   (Goal >70%)  Average glucose:   142 mg/dL for 14 days  Glucose management indicator:    %  Time in range (70-180 mg/dL):   82 %   (Goal >16%)  Time High (181-250 mg/dL):   16 %   (Goal < 10%)  Time Very High (>250 mg/dL):    2 %   (Goal < 5%)  Time Low (54-69 mg/dL):   0 %   (Goal <9%)  Time Very Low (<54 mg/dL):   0 %   (Goal <6%)  %CV (glucose variability)     %  (Goal <36%)   Lifestyle & Dietary Hx Patient lives with his wife.  She is currently in the hospital after surgery.  Patient does the cooking. He  uses the city bus for transportation. He is retired from tree work. He has a lot of support with family and friends  Estimated daily fluid intake: States that he drinks a lot of water but doesn't know how much Supplements: liquid MVI Sleep: good Stress / self-care: low stress Current average weekly physical activity: exercise bide 20 minutes daily prior to hospitalization.  He walks the dogs daily.    24-Hr Dietary Recall First Meal: bologna, egg, [employer] bread, whole milk, coffee with swt and low and cream Snack: none Second Meal:  lean hot dog, bread, applesauce Snack: none Third Meal: none Snack: none Beverages: water, whole milk, coffee with sweet and low and cream, sugar free punch, sugar free tea  Estimated Energy Needs Calories: 2000-2200 Protein: 70g  NUTRITION DIAGNOSIS  NB-1.1 Food and nutrition-related knowledge deficit As related to balance of carbohydrates, protein, and fat .  As evidenced by patient report and diet hx.   NUTRITION INTERVENTION  Nutrition education (E-1) on the following topics:  Counseling and diabetes education initiated.  Discussed My [place] Evens, the 3 main macronutrients and implications on blood glucose Discussed label reading Discussed benefits of increased activity and tips for any barriers Discussed basic physiology of Diabetes Instructed on blood glucose target, BG ranges pre and post  meals, and A1C goals  Handouts Provided Include  How to Thrive:  A Guide for Your Journey with Diabetes by the ADA Meal Plan Card My Plate Snack list Label reading Diabetes Resources   Learning Style & Readiness for Change Teaching method utilized: Visual & Auditory  Demonstrated degree of understanding via: Teach Back  Barriers to learning/adherence to lifestyle change: vision, HOH, fall risk  Plan:  Aim for 4 Carb Choices per meal (60 grams) +/- 1 either way  Aim for 0-1 Carbs per snack if hungry  Include protein in moderation with your meals and  snacks Consider reading food labels for Total Carbohydrate of foods Consider  increasing your activity level by using the stationary bike or walking for 20-30 minutes daily as tolerated Continue checking BG at alternate times per day  Continue taking medication as directed by MD  Consider Glucerna daily - samples provided along with coupons.  Alternately he could drink Boost Glucose Control.  MONITORING & EVALUATION Dietary intake, weekly physical activity, and label reading in 2 months.  Next Steps  Patient is to call for questions.  Niece states that patient would like to return with wife when she has recovered from surgery.

## 2024-03-05 NOTE — Patient Instructions (Signed)
 Plan:  Aim for 4 Carb Choices per meal (60 grams) +/- 1 either way  Aim for 0-1 Carbs per snack if hungry  Include protein in moderation with your meals and snacks Consider reading food labels for Total Carbohydrate of foods Consider  increasing your activity level by using the stationary bike or walking for 20-30 minutes daily as tolerated Continue checking BG at alternate times per day  Continue taking medication as directed by MD

## 2024-03-09 ENCOUNTER — Telehealth: Payer: Self-pay | Admitting: Cardiovascular Disease

## 2024-03-09 NOTE — Telephone Encounter (Signed)
*  STAT* If patient is at the pharmacy, call can be transferred to refill team.   1. Which medications need to be refilled? (please list name of each medication and dose if known) apixaban  (ELIQUIS ) 2.5 MG TABS tablet   2. Which pharmacy/location (including street and city if local pharmacy) is medication to be sent to? [id number] Jonette Nestle, [place] - [id number] AT [facility] Phone: [phone]  Fax: [phone]     3. Do they need a 30 day or 90 day supply? 90 Pt is out of medication

## 2024-03-12 ENCOUNTER — Other Ambulatory Visit: Payer: Self-pay

## 2024-03-12 MED ORDER — APIXABAN 2.5 MG PO TABS: 2.5000 mg | ORAL_TABLET | Freq: Two times a day (BID) | ORAL | 1 refills | Status: DC

## 2024-03-12 NOTE — Telephone Encounter (Signed)
 Prescription refill request for Eliquis  received. Indication:afib Last office visit:3/25 Scr:1.77  4/25 Age: 83 Weight:70.8  kg  Prescription refilled

## 2024-03-14 DIAGNOSIS — E1169 Type 2 diabetes mellitus with other specified complication: Secondary | ICD-10-CM | POA: Diagnosis not present

## 2024-03-21 ENCOUNTER — Other Ambulatory Visit: Payer: 59

## 2024-03-21 ENCOUNTER — Ambulatory Visit: Payer: 59

## 2024-03-21 ENCOUNTER — Ambulatory Visit: Payer: 59 | Admitting: Internal Medicine

## 2024-03-29 ENCOUNTER — Other Ambulatory Visit

## 2024-03-29 ENCOUNTER — Ambulatory Visit

## 2024-03-29 ENCOUNTER — Ambulatory Visit: Admitting: Internal Medicine

## 2024-03-30 ENCOUNTER — Ambulatory Visit: Payer: 59 | Admitting: Podiatry

## 2024-04-04 ENCOUNTER — Ambulatory Visit (INDEPENDENT_AMBULATORY_CARE_PROVIDER_SITE_OTHER): Admitting: Podiatry

## 2024-04-04 ENCOUNTER — Encounter: Payer: Self-pay | Admitting: Podiatry

## 2024-04-04 DIAGNOSIS — N1831 Chronic kidney disease, stage 3a: Secondary | ICD-10-CM

## 2024-04-04 DIAGNOSIS — M79675 Pain in left toe(s): Secondary | ICD-10-CM | POA: Diagnosis not present

## 2024-04-04 DIAGNOSIS — B351 Tinea unguium: Secondary | ICD-10-CM | POA: Diagnosis not present

## 2024-04-04 DIAGNOSIS — M79674 Pain in right toe(s): Secondary | ICD-10-CM | POA: Diagnosis not present

## 2024-04-04 DIAGNOSIS — D689 Coagulation defect, unspecified: Secondary | ICD-10-CM

## 2024-04-04 NOTE — Progress Notes (Signed)
This patient returns to my office for at risk foot care.  This patient requires this care by a professional since this patient will be at risk due to having , PAD and  Chronic kidney disease.  .  This patient is unable to cut nails himself since the patient cannot reach his nails.These nails are painful walking and wearing shoes.  This patient presents for at risk foot care today.  General Appearance  Alert, conversant and in no acute stress.  Vascular  Dorsalis pedis and posterior tibial  pulses are palpable  bilaterally.  Capillary return is within normal limits  bilaterally. Temperature is within normal limits  bilaterally.  Neurologic  Senn-Weinstein monofilament wire test within normal limits  bilaterally. Muscle power within normal limits bilaterally.  Nails Thick disfigured discolored nails with subungual debris  from hallux to fifth toes bilaterally. No evidence of bacterial infection or drainage bilaterally.  Orthopedic  No limitations of motion  feet .  No crepitus or effusions noted.  No bony pathology or digital deformities noted.  HAV  B/L.  Skin  normotropic skin with no porokeratosis noted bilaterally.  No signs of infections or ulcers noted.     Onychomycosis  Pain in right toes  Pain in left toes  Consent was obtained for treatment procedures.   Mechanical debridement of nails 1-5  bilaterally performed with a nail nipper.  Filed with dremel without incident.    Return office visit   3 months                   Told patient to return for periodic foot care and evaluation due to potential at risk complications.   Brooklyn Jeff DPM  

## 2024-04-26 ENCOUNTER — Ambulatory Visit

## 2024-04-26 ENCOUNTER — Ambulatory Visit: Admitting: Internal Medicine

## 2024-04-26 ENCOUNTER — Other Ambulatory Visit

## 2024-05-07 ENCOUNTER — Telehealth: Payer: Self-pay | Admitting: Medical Oncology

## 2024-05-07 NOTE — Telephone Encounter (Signed)
 Dr Nida Barrow asking if pt is finished with treatment. I returned the call and told her pt did not want anymore scans ,treatment or f/u.   I called pt and he said  I don't want no treatment . I ended up in hospital. He said he would see Dr. Marguerita Shih for a f/u.

## 2024-05-17 ENCOUNTER — Inpatient Hospital Stay: Attending: Internal Medicine

## 2024-05-17 ENCOUNTER — Inpatient Hospital Stay: Admitting: Internal Medicine

## 2024-05-17 VITALS — BP 131/77 | HR 70 | Temp 98.3°F | Resp 17 | Ht 68.0 in | Wt 154.8 lb

## 2024-05-17 DIAGNOSIS — R21 Rash and other nonspecific skin eruption: Secondary | ICD-10-CM | POA: Insufficient documentation

## 2024-05-17 DIAGNOSIS — R079 Chest pain, unspecified: Secondary | ICD-10-CM | POA: Insufficient documentation

## 2024-05-17 DIAGNOSIS — Z860102 Personal history of hyperplastic colon polyps: Secondary | ICD-10-CM | POA: Diagnosis not present

## 2024-05-17 DIAGNOSIS — Z87442 Personal history of urinary calculi: Secondary | ICD-10-CM | POA: Diagnosis not present

## 2024-05-17 DIAGNOSIS — C3490 Malignant neoplasm of unspecified part of unspecified bronchus or lung: Secondary | ICD-10-CM | POA: Diagnosis not present

## 2024-05-17 DIAGNOSIS — R42 Dizziness and giddiness: Secondary | ICD-10-CM | POA: Diagnosis not present

## 2024-05-17 DIAGNOSIS — Z7901 Long term (current) use of anticoagulants: Secondary | ICD-10-CM | POA: Insufficient documentation

## 2024-05-17 DIAGNOSIS — Z794 Long term (current) use of insulin: Secondary | ICD-10-CM | POA: Insufficient documentation

## 2024-05-17 DIAGNOSIS — R590 Localized enlarged lymph nodes: Secondary | ICD-10-CM | POA: Diagnosis not present

## 2024-05-17 DIAGNOSIS — Z79899 Other long term (current) drug therapy: Secondary | ICD-10-CM | POA: Diagnosis not present

## 2024-05-17 DIAGNOSIS — I2510 Atherosclerotic heart disease of native coronary artery without angina pectoris: Secondary | ICD-10-CM | POA: Insufficient documentation

## 2024-05-17 DIAGNOSIS — I130 Hypertensive heart and chronic kidney disease with heart failure and stage 1 through stage 4 chronic kidney disease, or unspecified chronic kidney disease: Secondary | ICD-10-CM | POA: Insufficient documentation

## 2024-05-17 DIAGNOSIS — Z9226 Personal history of immune checkpoint inhibitor therapy: Secondary | ICD-10-CM | POA: Diagnosis not present

## 2024-05-17 DIAGNOSIS — I252 Old myocardial infarction: Secondary | ICD-10-CM | POA: Diagnosis not present

## 2024-05-17 DIAGNOSIS — Z9221 Personal history of antineoplastic chemotherapy: Secondary | ICD-10-CM | POA: Insufficient documentation

## 2024-05-17 DIAGNOSIS — E1122 Type 2 diabetes mellitus with diabetic chronic kidney disease: Secondary | ICD-10-CM | POA: Insufficient documentation

## 2024-05-17 DIAGNOSIS — Z923 Personal history of irradiation: Secondary | ICD-10-CM | POA: Insufficient documentation

## 2024-05-17 DIAGNOSIS — R0602 Shortness of breath: Secondary | ICD-10-CM | POA: Insufficient documentation

## 2024-05-17 DIAGNOSIS — C3412 Malignant neoplasm of upper lobe, left bronchus or lung: Secondary | ICD-10-CM | POA: Diagnosis present

## 2024-05-17 DIAGNOSIS — N1832 Chronic kidney disease, stage 3b: Secondary | ICD-10-CM | POA: Diagnosis not present

## 2024-05-17 DIAGNOSIS — I5022 Chronic systolic (congestive) heart failure: Secondary | ICD-10-CM | POA: Insufficient documentation

## 2024-05-17 LAB — CBC WITH DIFFERENTIAL (CANCER CENTER ONLY)
Abs Immature Granulocytes: 0.02 10*3/uL (ref 0.00–0.07)
Basophils Absolute: 0 10*3/uL (ref 0.0–0.1)
Basophils Relative: 1 %
Eosinophils Absolute: 0.3 10*3/uL (ref 0.0–0.5)
Eosinophils Relative: 7 %
HCT: 34.2 % — ABNORMAL LOW (ref 39.0–52.0)
Hemoglobin: 11.2 g/dL — ABNORMAL LOW (ref 13.0–17.0)
Immature Granulocytes: 1 %
Lymphocytes Relative: 22 %
Lymphs Abs: 0.8 10*3/uL (ref 0.7–4.0)
MCH: 30.3 pg (ref 26.0–34.0)
MCHC: 32.7 g/dL (ref 30.0–36.0)
MCV: 92.4 fL (ref 80.0–100.0)
Monocytes Absolute: 0.4 10*3/uL (ref 0.1–1.0)
Monocytes Relative: 12 %
Neutro Abs: 2.2 10*3/uL (ref 1.7–7.7)
Neutrophils Relative %: 57 %
Platelet Count: 120 10*3/uL — ABNORMAL LOW (ref 150–400)
RBC: 3.7 MIL/uL — ABNORMAL LOW (ref 4.22–5.81)
RDW: 13.3 % (ref 11.5–15.5)
WBC Count: 3.8 10*3/uL — ABNORMAL LOW (ref 4.0–10.5)
nRBC: 0 % (ref 0.0–0.2)

## 2024-05-17 LAB — CMP (CANCER CENTER ONLY)
ALT: 8 U/L (ref 0–44)
AST: 13 U/L — ABNORMAL LOW (ref 15–41)
Albumin: 3.8 g/dL (ref 3.5–5.0)
Alkaline Phosphatase: 49 U/L (ref 38–126)
Anion gap: 6 (ref 5–15)
BUN: 18 mg/dL (ref 8–23)
CO2: 27 mmol/L (ref 22–32)
Calcium: 9.6 mg/dL (ref 8.9–10.3)
Chloride: 109 mmol/L (ref 98–111)
Creatinine: 1.58 mg/dL — ABNORMAL HIGH (ref 0.61–1.24)
GFR, Estimated: 43 mL/min — ABNORMAL LOW (ref >60)
Glucose, Bld: 113 mg/dL — ABNORMAL HIGH (ref 70–99)
Potassium: 4 mmol/L (ref 3.5–5.1)
Sodium: 142 mmol/L (ref 135–145)
Total Bilirubin: 0.8 mg/dL (ref 0.0–1.2)
Total Protein: 7.4 g/dL (ref 6.5–8.1)

## 2024-05-17 NOTE — Progress Notes (Signed)
 [facility] [phone]   [phone]  OFFICE PROGRESS NOTE  [name], MD [address] [place] [address]  DIAGNOSIS: Stage IIIC/IV (T3, N3, M0) non-small cell lung cancer, adenocarcinoma. He presented with a left upper lobe spiculated mass in addition to lingular lesion and suspicious mediastinal lymphadenopathy He also has bilateral hypermetabolic axillary lymph nodes which could be related to metastatic disease versus inflammatory as the patient did have vaccines in both of his arms the week prior to his PET scan.  This was diagnosed in August 2024.   Molecular Studies: His PD-L1 expression was 1% and he has no actionable mutations.   PRIOR THERAPY:  1) Concurrent chemoradiation with carboplatin  for an AUC of 2 and paclitaxel  45 mg/m.  First dose expected on 08/15/23.  Status post 5 cycles of first cycle was given with carboplatin  and paclitaxel  and starting from cycle #2 he was on carboplatin  and Abraxane  secondary to hypersensitivity reaction to paclitaxel . 2) Consolidation immunotherapy with Imfinzi  1500 Mg IV every 4 weeks.  First dose November 28, 2023.  Status post 2 cycles.  Discontinued secondary to intolerance and patient is requested.   CURRENT THERAPY: Observation.  INTERVAL HISTORY: [name] 83 y.o. male returns to the clinic today for follow-up visit. Discussed the use of AI scribe software for clinical note transcription with the patient, who gave verbal consent to proceed.  History of Present Illness   [name] is an 83 year old male with stage four non-small cell lung cancer who presents for evaluation. He is accompanied by his wife.  He was diagnosed with stage four non-small cell lung cancer, adenocarcinoma, in August 2024. There is no actionable mutation, and PD-L1 expression is one percent. He underwent concurrent chemoradiation followed by two cycles of consolidation treatment with immunotherapy using  Imfinzi . Due to poor tolerance, he requested to stop treatment and has been on observation since then.  Since stopping treatment, he feels better with resolution of a rash and no current issues with itching. However, he experienced a drop in blood sugar this morning, which made him feel 'a little bad.' He is drinking coffee to help with this.  He experiences occasional chest pain localized near his heart, sometimes feels short of breath, and gets tired and sleepy. He uses a cane for balance due to dizziness.  He mentions having a monoclonal embolism, but no further details are provided.       MEDICAL HISTORY: Past Medical History:  Diagnosis Date   AICD (automatic cardioverter/defibrillator) present    CAD (coronary artery disease) 80% stenosis diag of the LAD, 30% in OM2 branch of LCX in 2009    a. Nonobstructive CAD by cath 11/2011 with the exception of the pre-existing diagonal branch #2 lesion.   Chronic systolic CHF (congestive heart failure) (HCC)    CKD (chronic kidney disease) stage 3, GFR 30-59 ml/min (HCC)    Colon polyp, hyperplastic    History of kidney stones    History of radiation therapy    Left Lung- 08/17/23-09/29/23- Dr. Lynwood Nasuti   History of stress test 06/01/2012   Normal myocardial perfusion study. compared to the previous study there is no significant change. this is a low risk scan   Hypertension    Legally blind    both eyes   Myocardial infarction [facility]) 11/22/2011   NICM (nonischemic cardiomyopathy) (HCC)    a. Remote hx of dilated NICM with EF ranging 20-45%,  including normal EF by echo (55-60%) in 2014.   Peripheral arterial disease (HCC)    a. 06/2014: ABI right 0.99, left 1.2, LE dopplers revealing an occluded right posterior tibial. As symptoms were not felt r/t claudication, no further w/u at the time.   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 Requiring discontinuation of BB dose.    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  MEDICATIONS:  Current Outpatient Medications  Medication Sig Dispense Refill   apixaban  (ELIQUIS ) 2.5 MG TABS tablet Take 1 tablet (2.5 mg total) by mouth 2 (two) times daily. 180 tablet 1   Blood Glucose Monitoring Suppl DEVI 1 each by Does not apply route 3 (three) times daily. May dispense any manufacturer covered by patient's insurance. 1 each 0   BREO ELLIPTA  100-25 MCG/INH AEPB Inhale 1 puff into the lungs every morning.     brimonidine  (ALPHAGAN ) 0.2 % ophthalmic solution Place 1 drop into both eyes 2 (two) times daily.     Continuous Glucose Receiver (FREESTYLE LIBRE 3 READER) DEVI as directed.     Continuous Glucose Sensor (FREESTYLE LIBRE 3 PLUS SENSOR) MISC SMARTSIG:Topical Every 3 Months     Continuous Glucose Sensor (FREESTYLE LIBRE 3 PLUS SENSOR) MISC SMARTSIG:Topical Every 3 Months     cycloSPORINE  (RESTASIS ) 0.05 % ophthalmic emulsion Place 1 drop into both eyes 2 (two) times daily.     dorzolamide -timolol  (COSOPT ) 22.3-6.8 MG/ML ophthalmic solution Place 1 drop into both eyes 2 (two) times daily.     ENTRESTO  24-26 MG TAKE 1 TABLET BY MOUTH TWICE DAILY 180 tablet 3   furosemide  (LASIX ) 20 MG tablet Take 0.5 tablets (10 mg total) by mouth daily. 30 tablet 1   Glucagon , rDNA, (GLUCAGON  EMERGENCY) 1 MG KIT Inject 1 mg into the skin as needed for up to 2 doses (Severe low blood sugar). 1 kit 0   Glucose Blood (BLOOD GLUCOSE TEST STRIPS) STRP 1 each by Does not apply route 3 (three) times daily. Use as directed to check blood sugar. May dispense any manufacturer covered by patient's insurance and fits patient's device. 100 strip 0   hydrOXYzine  (ATARAX ) 10 MG tablet Take 1 tablet (10 mg total) by mouth at bedtime. 30 tablet 0   insulin  isophane & regular human KwikPen (HUMULIN 70/30  MIX) (70-30) 100 UNIT/ML KwikPen Inject 25 Units into the skin in the morning and at bedtime. 15 mL 0   Insulin  Pen Needle (PEN NEEDLES) 31G X 5 MM MISC 1 each by Does not apply route 3 (three) times daily. May dispense any manufacturer covered by patient's insurance. 100 each 0   isosorbide  mononitrate (IMDUR ) 30 MG 24 hr tablet Take 30 mg by mouth daily.     Lancet Device MISC 1 each by Does not apply route 3 (three) times daily. May dispense any manufacturer covered by patient's insurance. 1 each 0   Lancets MISC 1 each by Does not apply route 3 (three) times daily. Use as directed to check blood sugar. May dispense any manufacturer covered by patient's insurance and fits patient's device. 100 each 0   metoprolol  succinate (TOPROL -XL) 25 MG 24 hr tablet Take 0.5 tablets (12.5 mg total) by mouth daily. 15 tablet 1   ROCKLATAN  0.02-0.005 % SOLN Place 1 drop into both eyes at  bedtime.     spironolactone  (ALDACTONE ) 25 MG tablet Take 12.5 mg by mouth daily.     No current facility-administered medications for this visit.    SURGICAL HISTORY:  Past Surgical History:  Procedure Laterality Date   BACK SURGERY     BIV UPGRADE N/A 11/08/2019   Procedure: BIV UPGRADE;  Surgeon: Waddell Danelle ORN, MD;  Location: [facility] INVASIVE CV LAB;  Service: Cardiovascular;  Laterality: N/A;   BRONCHIAL BIOPSY  07/05/2023   Procedure: BRONCHIAL BIOPSIES;  Surgeon: Shelah Lamar RAMAN, MD;  Location: [facility] ENDOSCOPY;  Service: Pulmonary;;   BRONCHIAL BRUSHINGS  07/05/2023   Procedure: BRONCHIAL BRUSHINGS;  Surgeon: Shelah Lamar RAMAN, MD;  Location: [facility] ENDOSCOPY;  Service: Pulmonary;;   BRONCHIAL NEEDLE ASPIRATION BIOPSY  07/05/2023   Procedure: BRONCHIAL NEEDLE ASPIRATION BIOPSIES;  Surgeon: Shelah Lamar RAMAN, MD;  Location: [facility] ENDOSCOPY;  Service: Pulmonary;;   BRONCHIAL WASHINGS  07/05/2023   Procedure: BRONCHIAL WASHINGS;  Surgeon: Shelah Lamar RAMAN, MD;  Location: [facility] ENDOSCOPY;  Service: Pulmonary;;   CARDIAC CATHETERIZATION   11/2011   CARDIAC CATHETERIZATION  11/2011   didn't demonstrate high grade obstructive disease to account for his LV dysfunction.   CATARACT EXTRACTION, BILATERAL  1990's   CYSTOSCOPY     CYSTOSCOPY WITH BIOPSY Bilateral 08/03/2021   Procedure: CYSTOSCOPY WITH BLADDER BIOPSY, BILATERAL RETROGRADE PYELOGRAM;  Surgeon: Carolee Sherwood JONETTA DOUGLAS, MD;  Location: [facility] ORS;  Service: Urology;  Laterality: Bilateral;  REQUESTING 45 MINS   CYSTOSCOPY WITH URETHRAL DILATATION N/A 05/04/2013   Procedure: CYSTOSCOPY WITH URETHRAL DILATATION;  Surgeon: Alm RAMAN Molt, MD;  Location: [facility] NEURO ORS;  Service: Neurosurgery;  Laterality: N/A;  with insertion of foley catheter   EP study and ablation of VT  7/13   PVC focus mapped to the right coronary cusp of the aorta, limited ablation performed due to proximity of the focus to the right coronary artery   EYE SURGERY     FINE NEEDLE ASPIRATION  07/05/2023   Procedure: FINE NEEDLE ASPIRATION (FNA) LINEAR;  Surgeon: Shelah Lamar RAMAN, MD;  Location: [facility] ENDOSCOPY;  Service: Pulmonary;;   LEFT HEART CATH AND CORONARY ANGIOGRAPHY N/A 11/07/2019   Procedure: LEFT HEART CATH AND CORONARY ANGIOGRAPHY;  Surgeon: Darron Deatrice LABOR, MD;  Location: [facility];  Service: Cardiovascular;  Laterality: N/A;   LEFT HEART CATHETERIZATION WITH CORONARY ANGIOGRAM N/A 11/25/2011   Procedure: LEFT HEART CATHETERIZATION WITH CORONARY ANGIOGRAM;  Surgeon: Alm ORN Clay, MD;  Location: [facility] CATH LAB;  Service: Cardiovascular;  Laterality: N/A;   PACEMAKER IMPLANT N/A 07/12/2018   Procedure: PACEMAKER IMPLANT;  Surgeon: Francyne Headland, MD;  Location: [facility];  Service: Cardiovascular;  Laterality: N/A;   POSTERIOR FUSION LUMBAR SPINE  1979   ROTATOR CUFF REPAIR  2000's   left   V-TACH ABLATION N/A 06/06/2012   Procedure: V-TACH ABLATION;  Surgeon: Lynwood Rakers, MD;  Location: [facility] CATH LAB;  Service: Cardiovascular;  Laterality: N/A;   VIDEO BRONCHOSCOPY WITH ENDOBRONCHIAL ULTRASOUND  N/A 07/05/2023   Procedure: VIDEO BRONCHOSCOPY WITH ENDOBRONCHIAL ULTRASOUND;  Surgeon: Shelah Lamar RAMAN, MD;  Location: [facility] ENDOSCOPY;  Service: Pulmonary;  Laterality: N/A;    REVIEW OF SYSTEMS:  A comprehensive review of systems was negative except for: Constitutional: positive for fatigue Respiratory: positive for dyspnea on exertion and pleurisy/chest pain Musculoskeletal: positive for arthralgias and muscle weakness   PHYSICAL EXAMINATION: General appearance: alert, cooperative, fatigued, and no distress Head: Normocephalic, without obvious abnormality, atraumatic Neck: no adenopathy, no JVD,  supple, symmetrical, trachea midline, and thyroid  not enlarged, symmetric, no tenderness/mass/nodules Lymph nodes: Cervical, supraclavicular, and axillary nodes normal. Resp: clear to auscultation bilaterally Back: symmetric, no curvature. ROM normal. No CVA tenderness. Cardio: regular rate and rhythm, S1, S2 normal, no murmur, click, rub or gallop GI: soft, non-tender; bowel sounds normal; no masses,  no organomegaly Extremities: extremities normal, atraumatic, no cyanosis or edema  ECOG PERFORMANCE STATUS: 1 - Symptomatic but completely ambulatory  Blood pressure 131/77, pulse 70, temperature 98.3 F (36.8 C), resp. rate 17, height 5' 8 (1.727 m), weight 154 lb 12.8 oz (70.2 kg), SpO2 100%.  LABORATORY DATA: Lab Results  Component Value Date   WBC 3.8 (L) 05/17/2024   HGB 11.2 (L) 05/17/2024   HCT 34.2 (L) 05/17/2024   MCV 92.4 05/17/2024   PLT 120 (L) 05/17/2024      Chemistry      Component Value Date/Time   NA 131 (L) 02/23/2024 1257   NA 142 11/11/2022 1000   K 4.9 02/23/2024 1257   CL 96 (L) 02/23/2024 1231   CO2 25 02/23/2024 1231   BUN 38 (H) 02/23/2024 1231   BUN 18 11/11/2022 1000   CREATININE 1.77 (H) 02/23/2024 1231   CREATININE 1.39 (H) 02/02/2024 1312   CREATININE 1.37 (H) 01/24/2017 0950      Component Value Date/Time   CALCIUM  9.2 02/23/2024 1231   ALKPHOS 52  02/23/2024 1231   AST 18 02/23/2024 1231   AST 13 (L) 02/02/2024 1312   ALT 23 02/23/2024 1231   ALT 12 02/02/2024 1312   BILITOT 1.4 (H) 02/23/2024 1231   BILITOT 0.6 02/02/2024 1312       RADIOGRAPHIC STUDIES: No results found.  ASSESSMENT AND PLAN: This is a very pleasant 83 years old African-American male with Stage IIIC/IV (T3, N3, M0) non-small cell lung cancer, adenocarcinoma. He presented with a left upper lobe spiculated mass in addition to lingular lesion and suspicious mediastinal lymphadenopathy. He started a course of concurrent chemoradiation initially with carboplatin  for AUC of 2 and paclitaxel  45 Mg/M2 but paclitaxel  was the changed to Abraxane  starting from cycle #2 secondary to hypersensitivity reaction.  The patient received 5 cycles of concurrent chemoradiation.  He tolerated this treatment fairly well except for fatigue. The patient is currently on consolidation treatment with immunotherapy with Imfinzi  1500 Mg IV every 4 weeks.  Status post 2 cycles.  He has been tolerating this treatment well except for the itching and rash The patient he in the lower abdomen and back.  He decided to stop his treatment at that time.  Last dose was given in February 2025.  He is currently on observation. Assessment and Plan    Adenocarcinoma of lung Stage IV non-small cell lung cancer, adenocarcinoma, diagnosed in August 2024. No actionable mutation and PD-L1 expression of 1%. Previously underwent concurrent chemoradiation and two cycles of consolidation immunotherapy with Imfinzi , which was discontinued due to poor tolerance. Currently on observation. He expressed desire not to pursue further cancer treatment but agreed to imaging to assess current status of cancer. - Order chest CT in two weeks to assess current status of lung cancer - Schedule follow-up appointment one week after the scan to discuss results  Shortness of breath Intermittent shortness of breath. No acute distress  reported.  Dizziness Reports dizziness, possibly related to steroid use. - Advised to use a cane for balance  Type 2 diabetes mellitus without complications Reported episode of hypoglycemia this morning, managed with coffee.  History of  rash Previous rash resolved after discontinuation of immunotherapy. No current rash or itching reported.   He was advised to call immediately if he has any concerning symptoms in the interval.  The patient voices understanding of current disease status and treatment options and is in agreement with the current care plan.  All questions were answered. The patient knows to call the clinic with any problems, questions or concerns. We can certainly see the patient much sooner if necessary.  The total time spent in the appointment was 20 minutes.  Disclaimer: This note was dictated with voice recognition software. Similar sounding words can inadvertently be transcribed and may not be corrected upon review.

## 2024-05-31 ENCOUNTER — Ambulatory Visit (HOSPITAL_COMMUNITY)
Admission: RE | Admit: 2024-05-31 | Discharge: 2024-05-31 | Disposition: A | Discharge status: Home / Self Care | Source: Ambulatory Visit | Attending: Internal Medicine | Admitting: Internal Medicine

## 2024-05-31 DIAGNOSIS — C3490 Malignant neoplasm of unspecified part of unspecified bronchus or lung: Secondary | ICD-10-CM | POA: Insufficient documentation

## 2024-06-07 ENCOUNTER — Ambulatory Visit: Admitting: Dietician

## 2024-06-09 ENCOUNTER — Observation Stay (HOSPITAL_COMMUNITY)
Admission: EM | Admit: 2024-06-09 | Discharge: 2024-06-10 | Disposition: A | Discharge status: Home / Self Care | Attending: Emergency Medicine | Admitting: Emergency Medicine

## 2024-06-09 ENCOUNTER — Emergency Department (HOSPITAL_COMMUNITY)

## 2024-06-09 ENCOUNTER — Other Ambulatory Visit: Payer: Self-pay

## 2024-06-09 DIAGNOSIS — I5022 Chronic systolic (congestive) heart failure: Secondary | ICD-10-CM

## 2024-06-09 DIAGNOSIS — N1831 Chronic kidney disease, stage 3a: Secondary | ICD-10-CM

## 2024-06-09 DIAGNOSIS — I480 Paroxysmal atrial fibrillation: Secondary | ICD-10-CM

## 2024-06-09 DIAGNOSIS — Z95810 Presence of automatic (implantable) cardiac defibrillator: Secondary | ICD-10-CM | POA: Diagnosis not present

## 2024-06-09 DIAGNOSIS — Z9889 Other specified postprocedural states: Secondary | ICD-10-CM | POA: Diagnosis not present

## 2024-06-09 DIAGNOSIS — E785 Hyperlipidemia, unspecified: Secondary | ICD-10-CM

## 2024-06-09 DIAGNOSIS — Z8679 Personal history of other diseases of the circulatory system: Secondary | ICD-10-CM | POA: Diagnosis not present

## 2024-06-09 DIAGNOSIS — I200 Unstable angina: Principal | ICD-10-CM

## 2024-06-09 DIAGNOSIS — Z87891 Personal history of nicotine dependence: Secondary | ICD-10-CM | POA: Diagnosis not present

## 2024-06-09 DIAGNOSIS — R0789 Other chest pain: Principal | ICD-10-CM | POA: Insufficient documentation

## 2024-06-09 DIAGNOSIS — I5032 Chronic diastolic (congestive) heart failure: Secondary | ICD-10-CM | POA: Diagnosis present

## 2024-06-09 DIAGNOSIS — I209 Angina pectoris, unspecified: Secondary | ICD-10-CM | POA: Diagnosis not present

## 2024-06-09 DIAGNOSIS — Z79899 Other long term (current) drug therapy: Secondary | ICD-10-CM | POA: Diagnosis not present

## 2024-06-09 DIAGNOSIS — C3490 Malignant neoplasm of unspecified part of unspecified bronchus or lung: Secondary | ICD-10-CM | POA: Diagnosis not present

## 2024-06-09 DIAGNOSIS — I441 Atrioventricular block, second degree: Secondary | ICD-10-CM

## 2024-06-09 DIAGNOSIS — I130 Hypertensive heart and chronic kidney disease with heart failure and stage 1 through stage 4 chronic kidney disease, or unspecified chronic kidney disease: Secondary | ICD-10-CM | POA: Diagnosis not present

## 2024-06-09 DIAGNOSIS — I25110 Atherosclerotic heart disease of native coronary artery with unstable angina pectoris: Secondary | ICD-10-CM | POA: Insufficient documentation

## 2024-06-09 DIAGNOSIS — K219 Gastro-esophageal reflux disease without esophagitis: Secondary | ICD-10-CM | POA: Diagnosis present

## 2024-06-09 DIAGNOSIS — N1832 Chronic kidney disease, stage 3b: Secondary | ICD-10-CM | POA: Diagnosis not present

## 2024-06-09 DIAGNOSIS — I2510 Atherosclerotic heart disease of native coronary artery without angina pectoris: Secondary | ICD-10-CM | POA: Diagnosis present

## 2024-06-09 DIAGNOSIS — R079 Chest pain, unspecified: Secondary | ICD-10-CM | POA: Diagnosis present

## 2024-06-09 LAB — COMPREHENSIVE METABOLIC PANEL WITH GFR
ALT: 10 U/L (ref 0–44)
AST: 20 U/L (ref 15–41)
Albumin: 3.3 g/dL — ABNORMAL LOW (ref 3.5–5.0)
Alkaline Phosphatase: 55 U/L (ref 38–126)
Anion gap: 12 (ref 5–15)
BUN: 15 mg/dL (ref 8–23)
CO2: 23 mmol/L (ref 22–32)
Calcium: 9.2 mg/dL (ref 8.9–10.3)
Chloride: 104 mmol/L (ref 98–111)
Creatinine, Ser: 1.38 mg/dL — ABNORMAL HIGH (ref 0.61–1.24)
GFR, Estimated: 50 mL/min — ABNORMAL LOW (ref >60)
Glucose, Bld: 114 mg/dL — ABNORMAL HIGH (ref 70–99)
Potassium: 4 mmol/L (ref 3.5–5.1)
Sodium: 139 mmol/L (ref 135–145)
Total Bilirubin: 0.7 mg/dL (ref 0.0–1.2)
Total Protein: 7.5 g/dL (ref 6.5–8.1)

## 2024-06-09 LAB — CBC WITH DIFFERENTIAL/PLATELET
Abs Immature Granulocytes: 0.02 K/uL (ref 0.00–0.07)
Basophils Absolute: 0 K/uL (ref 0.0–0.1)
Basophils Relative: 1 %
Eosinophils Absolute: 0.1 K/uL (ref 0.0–0.5)
Eosinophils Relative: 4 %
HCT: 35.9 % — ABNORMAL LOW (ref 39.0–52.0)
Hemoglobin: 11.5 g/dL — ABNORMAL LOW (ref 13.0–17.0)
Immature Granulocytes: 1 %
Lymphocytes Relative: 23 %
Lymphs Abs: 0.8 K/uL (ref 0.7–4.0)
MCH: 29.6 pg (ref 26.0–34.0)
MCHC: 32 g/dL (ref 30.0–36.0)
MCV: 92.3 fL (ref 80.0–100.0)
Monocytes Absolute: 0.4 K/uL (ref 0.1–1.0)
Monocytes Relative: 10 %
Neutro Abs: 2.2 K/uL (ref 1.7–7.7)
Neutrophils Relative %: 61 %
Platelets: 129 K/uL — ABNORMAL LOW (ref 150–400)
RBC: 3.89 MIL/uL — ABNORMAL LOW (ref 4.22–5.81)
RDW: 12.6 % (ref 11.5–15.5)
WBC: 3.5 K/uL — ABNORMAL LOW (ref 4.0–10.5)
nRBC: 0 % (ref 0.0–0.2)

## 2024-06-09 LAB — TROPONIN I (HIGH SENSITIVITY)
Troponin I (High Sensitivity): 11 ng/L (ref <18)
Troponin I (High Sensitivity): 10 ng/L (ref <18)
Troponin I (High Sensitivity): 12 ng/L (ref <18)
Troponin I (High Sensitivity): 14 ng/L (ref <18)

## 2024-06-09 LAB — HEMOGLOBIN A1C
Hgb A1c MFr Bld: 5.2 % (ref 4.8–5.6)
Mean Plasma Glucose: 102.54 mg/dL

## 2024-06-09 LAB — CBC
HCT: 37.4 % — ABNORMAL LOW (ref 39.0–52.0)
Hemoglobin: 12.2 g/dL — ABNORMAL LOW (ref 13.0–17.0)
MCH: 29.8 pg (ref 26.0–34.0)
MCHC: 32.6 g/dL (ref 30.0–36.0)
MCV: 91.4 fL (ref 80.0–100.0)
Platelets: 138 K/uL — ABNORMAL LOW (ref 150–400)
RBC: 4.09 MIL/uL — ABNORMAL LOW (ref 4.22–5.81)
RDW: 12.6 % (ref 11.5–15.5)
WBC: 3.5 K/uL — ABNORMAL LOW (ref 4.0–10.5)
nRBC: 0 % (ref 0.0–0.2)

## 2024-06-09 LAB — LIPID PANEL
Cholesterol: 102 mg/dL (ref 0–200)
HDL: 41 mg/dL (ref >40)
LDL Cholesterol: 53 mg/dL (ref 0–99)
Total CHOL/HDL Ratio: 2.5 ratio
Triglycerides: 39 mg/dL (ref <150)
VLDL: 8 mg/dL (ref 0–40)

## 2024-06-09 LAB — PROTIME-INR
INR: 1.3 — ABNORMAL HIGH (ref 0.8–1.2)
Prothrombin Time: 17.3 s — ABNORMAL HIGH (ref 11.4–15.2)

## 2024-06-09 LAB — CREATININE, SERUM
Creatinine, Ser: 1.2 mg/dL (ref 0.61–1.24)
GFR, Estimated: 59 mL/min — ABNORMAL LOW (ref >60)

## 2024-06-09 LAB — CBG MONITORING, ED: Glucose-Capillary: 132 mg/dL — ABNORMAL HIGH (ref 70–99)

## 2024-06-09 LAB — GLUCOSE, CAPILLARY: Glucose-Capillary: 101 mg/dL — ABNORMAL HIGH (ref 70–99)

## 2024-06-09 LAB — APTT: aPTT: 39 s — ABNORMAL HIGH (ref 24–36)

## 2024-06-09 LAB — MAGNESIUM: Magnesium: 2 mg/dL (ref 1.7–2.4)

## 2024-06-09 MED ORDER — GUAIFENESIN 100 MG/5ML PO LIQD: 5.0000 mL | ORAL | Status: DC | PRN

## 2024-06-09 MED ORDER — SACUBITRIL-VALSARTAN 24-26 MG PO TABS
1.0000 | ORAL_TABLET | Freq: Two times a day (BID) | ORAL | Status: DC
  Administered 2024-06-09: 1 via ORAL
  Filled 2024-06-09: qty 1

## 2024-06-09 MED ORDER — IPRATROPIUM-ALBUTEROL 0.5-2.5 (3) MG/3ML IN SOLN: 3.0000 mL | RESPIRATORY_TRACT | Status: DC | PRN

## 2024-06-09 MED ORDER — ACETAMINOPHEN 650 MG RE SUPP
650.0000 mg | Freq: Four times a day (QID) | RECTAL | Status: DC | PRN
Start: 2024-06-09 — End: 2024-06-10

## 2024-06-09 MED ORDER — INSULIN ASPART 100 UNIT/ML IJ SOLN
0.0000 [IU] | Freq: Three times a day (TID) | INTRAMUSCULAR | Status: DC
  Administered 2024-06-10: 2 [IU] via SUBCUTANEOUS

## 2024-06-09 MED ORDER — HYDRALAZINE HCL 20 MG/ML IJ SOLN: 10.0000 mg | INTRAMUSCULAR | Status: DC | PRN

## 2024-06-09 MED ORDER — BISACODYL 5 MG PO TBEC
5.0000 mg | DELAYED_RELEASE_TABLET | Freq: Every day | ORAL | Status: DC | PRN
Start: 2024-06-09 — End: 2024-06-10

## 2024-06-09 MED ORDER — NITROGLYCERIN 0.4 MG SL SUBL
0.4000 mg | SUBLINGUAL_TABLET | SUBLINGUAL | Status: DC | PRN
  Administered 2024-06-10 (×3): 0.4 mg via SUBLINGUAL
  Filled 2024-06-09: qty 1

## 2024-06-09 MED ORDER — OXYCODONE HCL 5 MG PO TABS: 5.0000 mg | ORAL_TABLET | ORAL | Status: DC | PRN

## 2024-06-09 MED ORDER — ONDANSETRON HCL 4 MG/2ML IJ SOLN
4.0000 mg | Freq: Four times a day (QID) | INTRAMUSCULAR | Status: DC | PRN
Start: 2024-06-09 — End: 2024-06-10

## 2024-06-09 MED ORDER — HYDROMORPHONE HCL 1 MG/ML IJ SOLN
0.5000 mg | INTRAMUSCULAR | Status: DC | PRN
  Administered 2024-06-10: 1 mg via INTRAVENOUS
  Filled 2024-06-09: qty 1

## 2024-06-09 MED ORDER — ENOXAPARIN SODIUM 40 MG/0.4ML IJ SOSY
40.0000 mg | PREFILLED_SYRINGE | Freq: Every day | INTRAMUSCULAR | Status: DC
  Administered 2024-06-09: 40 mg via SUBCUTANEOUS
  Filled 2024-06-09: qty 0.4

## 2024-06-09 MED ORDER — ACETAMINOPHEN 325 MG PO TABS: 650.0000 mg | ORAL_TABLET | Freq: Four times a day (QID) | ORAL | Status: DC | PRN

## 2024-06-09 MED ORDER — FLUTICASONE FUROATE-VILANTEROL 100-25 MCG/ACT IN AEPB
1.0000 | INHALATION_SPRAY | Freq: Every day | RESPIRATORY_TRACT | Status: DC
  Administered 2024-06-10: 1 via RESPIRATORY_TRACT
  Filled 2024-06-09: qty 28

## 2024-06-09 MED ORDER — LIDOCAINE 5 % EX PTCH
1.0000 | MEDICATED_PATCH | CUTANEOUS | Status: DC
  Administered 2024-06-09: 1 via TRANSDERMAL
  Filled 2024-06-09: qty 1

## 2024-06-09 MED ORDER — SENNOSIDES-DOCUSATE SODIUM 8.6-50 MG PO TABS
1.0000 | ORAL_TABLET | Freq: Every evening | ORAL | Status: DC | PRN
Start: 2024-06-09 — End: 2024-06-10

## 2024-06-09 MED ORDER — NICOTINE 21 MG/24HR TD PT24: 21.0000 mg | MEDICATED_PATCH | Freq: Every day | TRANSDERMAL | Status: DC | PRN

## 2024-06-09 MED ORDER — FLEET ENEMA RE ENEM: 1.0000 | ENEMA | Freq: Once | RECTAL | Status: DC | PRN

## 2024-06-09 MED ORDER — METOPROLOL SUCCINATE ER 25 MG PO TB24
12.5000 mg | ORAL_TABLET | Freq: Every day | ORAL | Status: DC
  Administered 2024-06-09: 12.5 mg via ORAL
  Filled 2024-06-09: qty 1

## 2024-06-09 MED ORDER — INSULIN ASPART 100 UNIT/ML IJ SOLN: 0.0000 [IU] | Freq: Every day | INTRAMUSCULAR | Status: DC

## 2024-06-09 MED ORDER — ISOSORBIDE MONONITRATE ER 30 MG PO TB24
30.0000 mg | ORAL_TABLET | Freq: Every day | ORAL | Status: DC
  Administered 2024-06-09: 30 mg via ORAL
  Filled 2024-06-09: qty 1

## 2024-06-09 MED ORDER — APIXABAN 2.5 MG PO TABS: 2.5000 mg | ORAL_TABLET | Freq: Two times a day (BID) | ORAL | Status: DC

## 2024-06-09 MED ORDER — SPIRONOLACTONE 12.5 MG HALF TABLET
12.5000 mg | ORAL_TABLET | Freq: Every day | ORAL | Status: DC
  Administered 2024-06-09: 12.5 mg via ORAL
  Filled 2024-06-09 (×2): qty 1

## 2024-06-09 MED ORDER — ONDANSETRON HCL 4 MG PO TABS: 4.0000 mg | ORAL_TABLET | Freq: Four times a day (QID) | ORAL | Status: DC | PRN

## 2024-06-09 NOTE — ED Triage Notes (Signed)
 Pt bib [facility] c/o centralized chest pain that started last night and increased this am around 0300. Pt ended up taking 2 nitroglycerin  pills with some relief and then pt woke up again prior to arrival when pain started back up. Sharp pain radiates into left shoulder.  [employer]

## 2024-06-09 NOTE — ED Notes (Signed)
 Patient left the floor in stable condition, with his belongings and staff.

## 2024-06-09 NOTE — Consult Note (Signed)
 Cardiology Consultation   Patient ID: [name] MRN: [id number]; DOB: 05/07/1941  Admit date: 06/09/2024 Date of Consult: 06/09/2024  PCP:  [name], MD   [facility] Providers Cardiologist:  Debby Sor, MD  Cardiology APP:  Madie Jon Garre, [place]  Electrophysiologist:  Jerel Balding, MD        History of Present Illness: [name] 83 year old male with history of dilated cardiomyopathy with a EF as low as 20 to 25% in 2012 improved to 40 to 45% in 2021 with prior ventricular tachycardia ablation by Dr. Kelsie in 2013 CAD with nonobstructive CAD by cath 2020, CRT-D device, non-small cell lung cancer here with left-sided chest pain over the past 12 hours.  Pain seems to worsen when twisting his chest or moving and seem to resolve with administration of nitroglycerin .  This morning had a severe episode and it felt it in his left scapula and called EMS.  Shortness of breath seem to correlate as well. The pain comes and goes maybe last 10 minutes in duration.  Started yesterday.  When I push on his left chest wall he does wince.  Past Medical History:  Diagnosis Date   AICD (automatic cardioverter/defibrillator) present    CAD (coronary artery disease) 80% stenosis diag of the LAD, 30% in OM2 branch of LCX in 2009    a. Nonobstructive CAD by cath 11/2011 with the exception of the pre-existing diagonal branch #2 lesion.   Chronic systolic CHF (congestive heart failure) (HCC)    CKD (chronic kidney disease) stage 3, GFR 30-59 ml/min (HCC)    Colon polyp, hyperplastic    History of kidney stones    History of radiation therapy    Left Lung- 08/17/23-09/29/23- Dr. Lynwood Nasuti   History of stress test 06/01/2012   Normal myocardial perfusion study. compared to the previous study there is no significant change. this is a low risk scan   Hypertension    Legally blind    both eyes   Myocardial infarction [facility]) 11/22/2011   NICM (nonischemic cardiomyopathy) (HCC)     a. Remote hx of dilated NICM with EF ranging 20-45%, including normal EF by echo (55-60%) in 2014.   Peripheral arterial disease (HCC)    a. 06/2014: ABI right 0.99, left 1.2, LE dopplers revealing an occluded right posterior tibial. As symptoms were not felt r/t claudication, no further w/u at the time.   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 Requiring discontinuation of BB dose.   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    Past Surgical History:  Procedure Laterality Date   BACK SURGERY     BIV UPGRADE N/A 11/08/2019   Procedure: BIV UPGRADE;  Surgeon: Waddell Danelle ORN, MD;  Location: [facility] INVASIVE CV LAB;  Service: Cardiovascular;  Laterality: N/A;   BRONCHIAL BIOPSY  07/05/2023   Procedure: BRONCHIAL BIOPSIES;  Surgeon: Shelah Lamar RAMAN, MD;  Location: [facility] ENDOSCOPY;  Service: Pulmonary;;   BRONCHIAL BRUSHINGS  07/05/2023   Procedure: BRONCHIAL BRUSHINGS;  Surgeon: Shelah Lamar RAMAN, MD;  Location: [facility] ENDOSCOPY;  Service: Pulmonary;;   BRONCHIAL NEEDLE ASPIRATION BIOPSY  07/05/2023   Procedure: BRONCHIAL NEEDLE ASPIRATION BIOPSIES;  Surgeon: Shelah Lamar RAMAN, MD;  Location: [facility] ENDOSCOPY;  Service: Pulmonary;;   BRONCHIAL WASHINGS  07/05/2023   Procedure: BRONCHIAL WASHINGS;  Surgeon: Shelah Lamar RAMAN, MD;  Location: [facility] ENDOSCOPY;  Service:  Pulmonary;;   CARDIAC CATHETERIZATION  11/2011   CARDIAC CATHETERIZATION  11/2011   didn't demonstrate high grade obstructive disease to account for his LV dysfunction.   CATARACT EXTRACTION, BILATERAL  1990's   CYSTOSCOPY     CYSTOSCOPY WITH BIOPSY Bilateral 08/03/2021   Procedure: CYSTOSCOPY WITH BLADDER BIOPSY, BILATERAL RETROGRADE PYELOGRAM;  Surgeon: Carolee Sherwood JONETTA DOUGLAS, MD;  Location: [facility] ORS;  Service: Urology;  Laterality: Bilateral;  REQUESTING 45 MINS   CYSTOSCOPY WITH URETHRAL DILATATION N/A 05/04/2013   Procedure:  CYSTOSCOPY WITH URETHRAL DILATATION;  Surgeon: Alm GORMAN Molt, MD;  Location: [facility] NEURO ORS;  Service: Neurosurgery;  Laterality: N/A;  with insertion of foley catheter   EP study and ablation of VT  7/13   PVC focus mapped to the right coronary cusp of the aorta, limited ablation performed due to proximity of the focus to the right coronary artery   EYE SURGERY     FINE NEEDLE ASPIRATION  07/05/2023   Procedure: FINE NEEDLE ASPIRATION (FNA) LINEAR;  Surgeon: Shelah Lamar GORMAN, MD;  Location: [facility] ENDOSCOPY;  Service: Pulmonary;;   LEFT HEART CATH AND CORONARY ANGIOGRAPHY N/A 11/07/2019   Procedure: LEFT HEART CATH AND CORONARY ANGIOGRAPHY;  Surgeon: Darron Deatrice LABOR, MD;  Location: [facility] INVASIVE CV LAB;  Service: Cardiovascular;  Laterality: N/A;   LEFT HEART CATHETERIZATION WITH CORONARY ANGIOGRAM N/A 11/25/2011   Procedure: LEFT HEART CATHETERIZATION WITH CORONARY ANGIOGRAM;  Surgeon: Alm LELON Clay, MD;  Location: [facility] CATH LAB;  Service: Cardiovascular;  Laterality: N/A;   PACEMAKER IMPLANT N/A 07/12/2018   Procedure: PACEMAKER IMPLANT;  Surgeon: Francyne Headland, MD;  Location: [facility] INVASIVE CV LAB;  Service: Cardiovascular;  Laterality: N/A;   POSTERIOR FUSION LUMBAR SPINE  1979   ROTATOR CUFF REPAIR  2000's   left   V-TACH ABLATION N/A 06/06/2012   Procedure: V-TACH ABLATION;  Surgeon: Lynwood Rakers, MD;  Location: [facility] CATH LAB;  Service: Cardiovascular;  Laterality: N/A;   VIDEO BRONCHOSCOPY WITH ENDOBRONCHIAL ULTRASOUND N/A 07/05/2023   Procedure: VIDEO BRONCHOSCOPY WITH ENDOBRONCHIAL ULTRASOUND;  Surgeon: Shelah Lamar GORMAN, MD;  Location: [facility] ENDOSCOPY;  Service: Pulmonary;  Laterality: N/A;     Home Medications:  Prior to Admission medications   Medication Sig Start Date End Date Taking? Authorizing Provider  ROCKLATAN  0.02-0.005 % SOLN Place 1 drop into both eyes at bedtime.   Yes [provider]  apixaban  (ELIQUIS ) 2.5 MG TABS tablet Take 1 tablet (2.5 mg total) by mouth 2 (two) times  daily. 03/12/24   Croitoru, Mihai, MD  BREO ELLIPTA  100-25 MCG/INH AEPB Inhale 1 puff into the lungs every morning. 05/07/20   [provider]  brimonidine  (ALPHAGAN ) 0.2 % ophthalmic solution Place 1 drop into both eyes 2 (two) times daily. 03/29/23   [provider]  cycloSPORINE  (RESTASIS ) 0.05 % ophthalmic emulsion Place 1 drop into both eyes 2 (two) times daily.    [provider]  dorzolamide -timolol  (COSOPT ) 22.3-6.8 MG/ML ophthalmic solution Place 1 drop into both eyes 2 (two) times daily. 03/14/20   [provider]  ENTRESTO  24-26 MG TAKE 1 TABLET BY MOUTH TWICE DAILY 11/08/23   Burnard Debby LABOR, MD  furosemide  (LASIX ) 20 MG tablet Take 0.5 tablets (10 mg total) by mouth daily. 02/27/24   Lue Elsie BROCKS, MD  Glucagon , rDNA, (GLUCAGON  EMERGENCY) 1 MG KIT Inject 1 mg into the skin as needed for up to 2 doses (Severe low blood sugar). 02/26/24   Lue Elsie BROCKS, MD  hydrOXYzine  (ATARAX ) 10 MG tablet  Take 1 tablet (10 mg total) by mouth at bedtime. 02/02/24   Heilingoetter, Cassandra L, PA-C  insulin  isophane & regular human KwikPen (HUMULIN 70/30 MIX) (70-30) 100 UNIT/ML KwikPen Inject 25 Units into the skin in the morning and at bedtime. 02/27/24   Lue Elsie BROCKS, MD  isosorbide  mononitrate (IMDUR ) 30 MG 24 hr tablet Take 30 mg by mouth daily. 03/18/24   [provider]  metoprolol  succinate (TOPROL -XL) 100 MG 24 hr tablet Take 100 mg by mouth daily. 05/09/24   [provider]  metoprolol  succinate (TOPROL -XL) 25 MG 24 hr tablet Take 0.5 tablets (12.5 mg total) by mouth daily. 02/26/24   Lue Elsie BROCKS, MD  spironolactone  (ALDACTONE ) 25 MG tablet Take 12.5 mg by mouth daily. 03/15/24   [provider]    Scheduled Meds:  enoxaparin  (LOVENOX ) injection  40 mg Subcutaneous Q24H   Continuous Infusions:  PRN Meds: acetaminophen  **OR** acetaminophen , bisacodyl , guaiFENesin , hydrALAZINE , HYDROmorphone  (DILAUDID ) injection,  ipratropium-albuterol , nicotine , nitroGLYCERIN , ondansetron  **OR** ondansetron  (ZOFRAN ) IV, oxyCODONE , senna-docusate, sodium phosphate   Allergies:    Allergies  Allergen Reactions   Paclitaxel  Other (See Comments) and Hypertension    Pt reported chest pain, back pain, and SOB during first infusion (see notes from 9/23). Infusion discontinued.    Allegra [Fexofenadine] Other (See Comments)    Makes him dizzy and feels like he will fall.   Sonafine [Wound Dressings] Itching    Itching and burning to skin  08/25/23    Social History:   Social History   Socioeconomic History   Marital status: Legally Separated    Spouse name: Not on file   Number of children: 3   Years of education: Not on file   Highest education level: Not on file  Occupational History   Occupation: Retired    Associate Professor: RETIRED  Tobacco Use   Smoking status: Former    Current packs/day: 0.00    Types: Cigarettes    Start date: 11/22/1949    Quit date: 11/23/1999    Years since quitting: 24.5    Passive exposure: Current   Smokeless tobacco: Never  Vaping Use   Vaping status: Never Used  Substance and Sexual Activity   Alcohol use: No    Comment: 05/26/12 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; stopped drinking in the early 1980's   Drug use: No   Sexual activity: Yes  Other Topics Concern   Not on file  Social History Narrative   Not on file   Social Drivers of Health   Financial Resource Strain: Not on file  Food Insecurity: No Food Insecurity (02/23/2024)   Hunger Vital Sign    Worried About Running Out of Food in the Last Year: Never true    Ran Out of Food in the Last Year: Never true  Transportation Needs: No Transportation Needs (02/23/2024)   PRAPARE - Administrator, Civil Service (Medical): No    Lack of Transportation (Non-Medical): No  Physical Activity: Not on file  Stress: Not on file  Social Connections: Moderately Integrated (02/23/2024)   Social Connection and Isolation Panel    Frequency of  Communication with Friends and Family: More than three times a week    Frequency of Social Gatherings with Friends and Family: Twice a week    Attends Religious Services: More than 4 times per year    Active Member of [employer] or Organizations: No    Attends Banker Meetings: Never    Marital Status: Married  Intimate  Partner Violence: Not At Risk (02/23/2024)   Humiliation, Afraid, Rape, and Kick questionnaire    Fear of Current or Ex-Partner: No    Emotionally Abused: No    Physically Abused: No    Sexually Abused: No    Family History:    Family History  Problem Relation Age of Onset   Asthma Mother    Other Other        Unsure if any heart disease in his family.     ROS:  Please see the history of present illness.  No fevers chills All other ROS reviewed and negative.     Physical Exam/Data: Vitals:   06/09/24 1330 06/09/24 1400 06/09/24 1500 06/09/24 1616  BP: (!) 159/89 (!) 142/83 (!) 156/85   Pulse: 72 71 70   Resp: 15 14 18    Temp:    98.7 F (37.1 C)  TempSrc:    Oral  SpO2: 100% 100% 100%   Weight:      Height:        Intake/Output Summary (Last 24 hours) at 06/09/2024 1619 Last data filed at 06/09/2024 1616 Gross per 24 hour  Intake --  Output 300 ml  Net -300 ml      06/09/2024   10:38 AM 05/17/2024    8:15 AM 03/05/2024   10:21 AM  Last 3 Weights  Weight (lbs) 154 lb 154 lb 12.8 oz 156 lb  Weight (kg) 69.854 kg 70.217 kg 70.761 kg     Body mass index is 24.12 kg/m.  General:  Well nourished, well developed, in no acute distress HEENT: normal Neck: no JVD Vascular: No carotid bruits; Distal pulses 2+ bilaterally Cardiac:  normal S1, S2; RRR; no murmur  Lungs:  clear to auscultation bilaterally, no wheezing, rhonchi or rales  Abd: soft, nontender, no hepatomegaly  Ext: no edema Musculoskeletal:  No deformities, BUE and BLE strength normal and equal Skin: warm and dry  Neuro:  CNs 2-12 intact, no focal abnormalities noted Psych:   Normal affect   EKG:  The EKG was personally reviewed and demonstrates: AV pacing Telemetry:  Telemetry was personally reviewed and demonstrates: No adverse arrhythmias  Relevant CV Studies: Cardiac catheterization 12-07-19: Prox LAD to Mid LAD lesion is 30% stenosed. 1st Diag lesion is 80% stenosed. Diagnostic Dominance: Right    1.  Stable 80% stenosis in diagonal branch which was described on previous cardiac catheterization in 2009.  No other obstructive disease.   2.  Normal left ventricular end-diastolic pressure.  Left ventricular angiography was not performed due to chronic kidney disease.    Laboratory Data: High Sensitivity Troponin:   Recent Labs  Lab 06/09/24 1105 06/09/24 1321  TROPONINIHS 11 10     Chemistry Recent Labs  Lab 06/09/24 1105  NA 139  K 4.0  CL 104  CO2 23  GLUCOSE 114*  BUN 15  CREATININE 1.38*  CALCIUM  9.2  MG 2.0  GFRNONAA 50*  ANIONGAP 12    Recent Labs  Lab 06/09/24 1105  PROT 7.5  ALBUMIN  3.3*  AST 20  ALT 10  ALKPHOS 55  BILITOT 0.7   Lipids No results for input(s): CHOL, TRIG, HDL, LABVLDL, LDLCALC, CHOLHDL in the last 168 hours.  Hematology Recent Labs  Lab 06/09/24 1105  WBC 3.5*  RBC 3.89*  HGB 11.5*  HCT 35.9*  MCV 92.3  MCH 29.6  MCHC 32.0  RDW 12.6  PLT 129*   Thyroid  No results for input(s): TSH, FREET4 in the last 168  hours.  BNPNo results for input(s): BNP, PROBNP in the last 168 hours.  DDimer No results for input(s): DDIMER in the last 168 hours.  Radiology/Studies:  DG Chest Portable 1 View Result Date: 06/09/2024 CLINICAL DATA:  Chest pain.  History of non-small cell lung cancer. EXAM: PORTABLE CHEST 1 VIEW COMPARISON:  CT 05/31/2024 FINDINGS: Left chest wall ICD noted with leads in the right atrial appendage, coronary sinus and right ventricle. Heart size and mediastinal contours are stable. No significant pleural effusion. Opacity within the apical segment of the left  upper lobe corresponding to treated lung cancer. Persistent masslike opacity within the lingula is unchanged from the CT dated 05/31/2024. Decreased aeration to the left lung base may reflect atelectasis or airspace disease. Mild blunting of the left costophrenic angle. Visualized osseous structures appear grossly intact. IMPRESSION: 1. Decreased aeration to the left lung base may reflect atelectasis or airspace disease. 2. Persistent mass-like opacity within the lingula is unchanged from the CT dated 05/31/2024. 3. Opacity within the apical segment of the left upper lobe corresponding to treated lung cancer. Electronically Signed   By: Waddell Calk M.D.   On: 06/09/2024 12:03     Assessment and Plan:  84 year old admitted with anginal symptoms, known nonobstructive coronary disease from cardiac catheterization on 11/07/2019  Angina - Appreciate hospitalist bringing him in for observation.  First 2 troponins have been normal.  It would not be unreasonable to check another troponin perhaps in the next several hours to see if there is any change given his constellation of left-sided chest pain type symptoms. -Thankfully on 11/07/2019 he underwent a cardiac catheterization that showed a stable 80% stenosis in the diagonal branch which was described on previous catheterization in 2009. -If stable overnight, would recommend discharge with outpatient follow-up and potential stress test.  Ultimately, he may be having more musculoskeletal type discomfort.  Clearly there is a component of discomfort when palpating his chest wall.  I did not notice any obvious rashes/zoster.  Chronic systolic heart failure, dilated cardiomyopathy - 04/30/2023 echocardiogram shows EF of 35 to 40% which is stable from 2023. -Has been on maximum tolerated doses of Entresto , spironolactone , metoprolol  succinate  Chronic kidney disease 3a -Creatinine 1.38 currently.  Previously in April 1.77  History of ventricular tachycardia  ablation - July 2013 Dr. Angelita  Coronary artery disease - Status post non-ST elevation myocardial infarction in 2012 with nonobstructive CAD by cath then.  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 Gatha  Hyperlipidemia - LDL 55  Paroxysmal atrial fibrillation - Burden of atrial fibrillation similar to previous downloads at 1.5%. - On apixaban  2.5 mg twice a day.            For questions or updates, please contact [facility] Please consult [email or web address] for contact info under    Signed, Oneil Parchment, MD  06/09/2024 4:19 PM

## 2024-06-09 NOTE — H&P (Signed)
 History and Physical    [name] FMW:[id number] DOB: 03/11/1941 DOA: 06/09/2024  PCP: [name], MD Patient coming from: Chest pain  Chief Complaint: Chest pain  HPI: [name] is a 83 y.o. male with medical history significant of congestive heart failure with EF 45% with defibrillator, prior ventricular tachycardia, nonobstructive CAD, non-small cell lung cancer comes to the hospital with complaints of ongoing chest pain.  Reporting that pain has been coming and going lasting for about 10 minutes at a time.  Pain does resolve with nitroglycerin .  This morning had a severe pain radiated to his left side of his back along with shortness of breath therefore came to the hospital.  In the ER troponins remain flat, EKG is nonischemic.  Given extensive heart history he has been admitted to the hospital with cardiology consult.   Review of Systems: As per HPI otherwise 10 point review of systems negative.  Review of Systems Otherwise negative except as per HPI, including: General: Denies fever, chills, night sweats or unintended weight loss. Resp: Denies cough, wheezing, shortness of breath. Cardiac: Denies palpitations, orthopnea, paroxysmal nocturnal dyspnea. GI: Denies abdominal pain, nausea, vomiting, diarrhea or constipation GU: Denies dysuria, frequency, hesitancy or incontinence MS: Denies muscle aches, joint pain or swelling Neuro: Denies headache, neurologic deficits (focal weakness, numbness, tingling), abnormal gait Psych: Denies anxiety, depression, SI/HI/AVH Skin: Denies new rashes or lesions ID: Denies sick contacts, exotic exposures, travel  Past Medical History:  Diagnosis Date   AICD (automatic cardioverter/defibrillator) present    CAD (coronary artery disease) 80% stenosis diag of the LAD, 30% in OM2 branch of LCX in 2009    a. Nonobstructive CAD by cath 11/2011 with the exception of the pre-existing diagonal branch #2 lesion.   Chronic systolic CHF  (congestive heart failure) (HCC)    CKD (chronic kidney disease) stage 3, GFR 30-59 ml/min (HCC)    Colon polyp, hyperplastic    History of kidney stones    History of radiation therapy    Left Lung- 08/17/23-09/29/23- Dr. Lynwood Nasuti   History of stress test 06/01/2012   Normal myocardial perfusion study. compared to the previous study there is no significant change. this is a low risk scan   Hypertension    Legally blind    both eyes   Myocardial infarction [facility]) 11/22/2011   NICM (nonischemic cardiomyopathy) (HCC)    a. Remote hx of dilated NICM with EF ranging 20-45%, including normal EF by echo (55-60%) in 2014.   Peripheral arterial disease (HCC)    a. 06/2014: ABI right 0.99, left 1.2, LE dopplers revealing an occluded right posterior tibial. As symptoms were not felt r/t claudication, no further w/u at the time.   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 Requiring discontinuation of BB dose.   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    Past Surgical History:  Procedure Laterality Date   BACK SURGERY     BIV UPGRADE N/A 11/08/2019   Procedure: BIV UPGRADE;  Surgeon: Waddell Danelle ORN, MD;  Location: [facility] INVASIVE CV LAB;  Service: Cardiovascular;  Laterality: N/A;   BRONCHIAL BIOPSY  07/05/2023   Procedure: BRONCHIAL BIOPSIES;  Surgeon: Shelah Lamar RAMAN, MD;  Location: [facility] ENDOSCOPY;  Service: Pulmonary;;   BRONCHIAL BRUSHINGS  07/05/2023   Procedure: BRONCHIAL BRUSHINGS;  Surgeon: Shelah Lamar RAMAN, MD;  Location: [facility] ENDOSCOPY;  Service:  Pulmonary;;   BRONCHIAL NEEDLE ASPIRATION BIOPSY  07/05/2023   Procedure: BRONCHIAL NEEDLE ASPIRATION BIOPSIES;  Surgeon: Shelah Lamar RAMAN, MD;  Location: [facility] ENDOSCOPY;  Service: Pulmonary;;   BRONCHIAL WASHINGS  07/05/2023   Procedure: BRONCHIAL WASHINGS;  Surgeon: Shelah Lamar RAMAN, MD;  Location: [facility] ENDOSCOPY;  Service:  Pulmonary;;   CARDIAC CATHETERIZATION  11/2011   CARDIAC CATHETERIZATION  11/2011   didn't demonstrate high grade obstructive disease to account for his LV dysfunction.   CATARACT EXTRACTION, BILATERAL  1990's   CYSTOSCOPY     CYSTOSCOPY WITH BIOPSY Bilateral 08/03/2021   Procedure: CYSTOSCOPY WITH BLADDER BIOPSY, BILATERAL RETROGRADE PYELOGRAM;  Surgeon: Carolee Sherwood JONETTA DOUGLAS, MD;  Location: [facility] ORS;  Service: Urology;  Laterality: Bilateral;  REQUESTING 45 MINS   CYSTOSCOPY WITH URETHRAL DILATATION N/A 05/04/2013   Procedure: CYSTOSCOPY WITH URETHRAL DILATATION;  Surgeon: Alm RAMAN Molt, MD;  Location: [facility] NEURO ORS;  Service: Neurosurgery;  Laterality: N/A;  with insertion of foley catheter   EP study and ablation of VT  7/13   PVC focus mapped to the right coronary cusp of the aorta, limited ablation performed due to proximity of the focus to the right coronary artery   EYE SURGERY     FINE NEEDLE ASPIRATION  07/05/2023   Procedure: FINE NEEDLE ASPIRATION (FNA) LINEAR;  Surgeon: Shelah Lamar RAMAN, MD;  Location: [facility] ENDOSCOPY;  Service: Pulmonary;;   LEFT HEART CATH AND CORONARY ANGIOGRAPHY N/A 11/07/2019   Procedure: LEFT HEART CATH AND CORONARY ANGIOGRAPHY;  Surgeon: Darron Deatrice LABOR, MD;  Location: [facility]VAS[facility] CV LAB;  Service: Cardiovascular;  Laterality: N/A;   LEFT HEART CATHETERIZATION WITH CORONARY ANGIOGRAM N/A 11/25/2011   Procedure: LEFT HEART CATHETERIZATION WITH CORONARY ANGIOGRAM;  Surgeon: Alm LELON Clay, MD;  Location: [facility] CATH LAB;  Service: Cardiovascular;  Laterality: N/A;   PACEMAKER IMPLANT N/A 07/12/2018   Procedure: PACEMAKER IMPLANT;  Surgeon: Francyne Headland, MD;  Location: [facility]VAS[facility] CV LAB;  Service: Cardiovascular;  Laterality: N/A;   POSTERIOR FUSION LUMBAR SPINE  1979   ROTATOR CUFF REPAIR  2000's   left   V-TACH ABLATION N/A 06/06/2012   Procedure: V-TACH ABLATION;  Surgeon: Lynwood Rakers, MD;  Location: [facility] CATH LAB;  Service: Cardiovascular;  Laterality: N/A;   VIDEO  BRONCHOSCOPY WITH ENDOBRONCHIAL ULTRASOUND N/A 07/05/2023   Procedure: VIDEO BRONCHOSCOPY WITH ENDOBRONCHIAL ULTRASOUND;  Surgeon: Shelah Lamar RAMAN, MD;  Location: [facility] ENDOSCOPY;  Service: Pulmonary;  Laterality: N/A;    SOCIAL HISTORY:  reports that he quit smoking about 24 years ago. His smoking use included cigarettes. He started smoking about 74 years ago. He has been exposed to tobacco smoke. He has never used smokeless tobacco. He reports that he does not drink alcohol and does not use drugs.  Allergies  Allergen Reactions   Paclitaxel  Other (See Comments) and Hypertension    Pt reported chest pain, back pain, and SOB during first infusion (see notes from 9/23). Infusion discontinued.    Allegra [Fexofenadine] Other (See Comments)    Makes him dizzy and feels like he will fall.   Sonafine [Wound Dressings] Itching    Itching and burning to skin  08/25/23    FAMILY HISTORY: Family History  Problem Relation Age of Onset   Asthma Mother    Other Other        Unsure if any heart disease in his family.     Prior to Admission medications   Medication Sig Start Date End Date Taking? Authorizing Provider  ROCKLATAN  0.02-0.005 %  SOLN Place 1 drop into both eyes at bedtime.   Yes [provider]  apixaban  (ELIQUIS ) 2.5 MG TABS tablet Take 1 tablet (2.5 mg total) by mouth 2 (two) times daily. 03/12/24   Croitoru, Mihai, MD  BREO ELLIPTA  100-25 MCG/INH AEPB Inhale 1 puff into the lungs every morning. 05/07/20   [provider]  brimonidine  (ALPHAGAN ) 0.2 % ophthalmic solution Place 1 drop into both eyes 2 (two) times daily. 03/29/23   [provider]  cycloSPORINE  (RESTASIS ) 0.05 % ophthalmic emulsion Place 1 drop into both eyes 2 (two) times daily.    [provider]  dorzolamide -timolol  (COSOPT ) 22.3-6.8 MG/ML ophthalmic solution Place 1 drop into both eyes 2 (two) times daily. 03/14/20   [provider]  ENTRESTO  24-26 MG TAKE 1 TABLET BY MOUTH  TWICE DAILY 11/08/23   Burnard Debby LABOR, MD  furosemide  (LASIX ) 20 MG tablet Take 0.5 tablets (10 mg total) by mouth daily. 02/27/24   Lue Elsie BROCKS, MD  Glucagon , rDNA, (GLUCAGON  EMERGENCY) 1 MG KIT Inject 1 mg into the skin as needed for up to 2 doses (Severe low blood sugar). 02/26/24   Lue Elsie BROCKS, MD  hydrOXYzine  (ATARAX ) 10 MG tablet Take 1 tablet (10 mg total) by mouth at bedtime. 02/02/24   Heilingoetter, Cassandra L, PA-C  insulin  isophane & regular human KwikPen (HUMULIN 70/30 MIX) (70-30) 100 UNIT/ML KwikPen Inject 25 Units into the skin in the morning and at bedtime. 02/27/24   Lue Elsie BROCKS, MD  isosorbide  mononitrate (IMDUR ) 30 MG 24 hr tablet Take 30 mg by mouth daily. 03/18/24   [provider]  metoprolol  succinate (TOPROL -XL) 100 MG 24 hr tablet Take 100 mg by mouth daily. 05/09/24   [provider]  metoprolol  succinate (TOPROL -XL) 25 MG 24 hr tablet Take 0.5 tablets (12.5 mg total) by mouth daily. 02/26/24   Lue Elsie BROCKS, MD  spironolactone  (ALDACTONE ) 25 MG tablet Take 12.5 mg by mouth daily. 03/15/24   [provider]    Physical Exam: Vitals:   06/09/24 1115 06/09/24 1130 06/09/24 1315 06/09/24 1330  BP: (!) 140/90 (!) 151/84 (!) 146/79 (!) 159/89  Pulse: 70 70 72 72  Resp: 17 16 16 15   Temp:      TempSrc:      SpO2: 99% 100% 100% 100%  Weight:      Height:          Constitutional: NAD, calm, comfortable Eyes: PERRL, lids and conjunctivae normal ENMT: Mucous membranes are moist. Posterior pharynx clear of any exudate or lesions.Normal dentition.  Neck: normal, supple, no masses, no thyromegaly Respiratory: clear to auscultation bilaterally, no wheezing, no crackles. Normal respiratory effort. No accessory muscle use.  Cardiovascular: Regular rate and rhythm, no murmurs / rubs / gallops. No extremity edema. 2+ pedal pulses. No carotid bruits.  Abdomen: no tenderness, no masses palpated. No hepatosplenomegaly. Bowel  sounds positive.  Musculoskeletal: no clubbing / cyanosis. No joint deformity upper and lower extremities. Good ROM, no contractures. Normal muscle tone.  Skin: no rashes, lesions, ulcers. No induration Neurologic: CN 2-12 grossly intact. Sensation intact, DTR normal. Strength 5/5 in all 4.  Psychiatric: Normal judgment and insight. Alert and oriented x 3. Normal mood.    Body mass index is 24.12 kg/m.      Labs on Admission: I have personally reviewed following labs and imaging studies  CBC: Recent Labs  Lab 06/09/24 1105  WBC 3.5*  NEUTROABS 2.2  HGB 11.5*  HCT 35.9*  MCV 92.3  PLT 129*   Basic Metabolic Panel: Recent Labs  Lab 06/09/24 1105  NA 139  K 4.0  CL 104  CO2 23  GLUCOSE 114*  BUN 15  CREATININE 1.38*  CALCIUM  9.2  MG 2.0   GFR: Estimated Creatinine Clearance: 36.6 mL/min (A) (by C-G formula based on SCr of 1.38 mg/dL (H)). Liver Function Tests: Recent Labs  Lab 06/09/24 1105  AST 20  ALT 10  ALKPHOS 55  BILITOT 0.7  PROT 7.5  ALBUMIN  3.3*   No results for input(s): LIPASE, AMYLASE in the last 168 hours. No results for input(s): AMMONIA in the last 168 hours. Coagulation Profile: Recent Labs  Lab 06/09/24 1105  INR 1.3*   Cardiac Enzymes: No results for input(s): CKTOTAL, CKMB, CKMBINDEX, TROPONINI in the last 168 hours. BNP (last 3 results) No results for input(s): PROBNP in the last 8760 hours. HbA1C: No results for input(s): HGBA1C in the last 72 hours. CBG: Recent Labs  Lab 06/09/24 1111  GLUCAP 132*   Lipid Profile: No results for input(s): CHOL, HDL, LDLCALC, TRIG, CHOLHDL, LDLDIRECT in the last 72 hours. Thyroid  Function Tests: No results for input(s): TSH, T4TOTAL, FREET4, T3FREE, THYROIDAB in the last 72 hours. Anemia Panel: No results for input(s): VITAMINB12, FOLATE, FERRITIN, TIBC, IRON, RETICCTPCT in the last 72 hours. Urine analysis:    Component Value  Date/Time   COLORURINE YELLOW 02/23/2024 1145   APPEARANCEUR CLEAR 02/23/2024 1145   LABSPEC 1.010 02/23/2024 1145   PHURINE 6.0 02/23/2024 1145   GLUCOSEU >=500 (A) 02/23/2024 1145   HGBUR TRACE (A) 02/23/2024 1145   BILIRUBINUR NEGATIVE 02/23/2024 1145   KETONESUR NEGATIVE 02/23/2024 1145   PROTEINUR NEGATIVE 02/23/2024 1145   UROBILINOGEN 0.2 11/22/2011 0247   NITRITE NEGATIVE 02/23/2024 1145   LEUKOCYTESUR NEGATIVE 02/23/2024 1145   Sepsis Labs: !!!!!!!!!!!!!!!!!!!!!!!!!!!!!!!!!!!!!!!!!!!! @LABRCNTIP (procalcitonin:4,lacticidven:4) )No results found for this or any previous visit (from the past 240 hours).   Radiological Exams on Admission: DG Chest Portable 1 View Result Date: 06/09/2024 CLINICAL DATA:  Chest pain.  History of non-small cell lung cancer. EXAM: PORTABLE CHEST 1 VIEW COMPARISON:  CT 05/31/2024 FINDINGS: Left chest wall ICD noted with leads in the right atrial appendage, coronary sinus and right ventricle. Heart size and mediastinal contours are stable. No significant pleural effusion. Opacity within the apical segment of the left upper lobe corresponding to treated lung cancer. Persistent masslike opacity within the lingula is unchanged from the CT dated 05/31/2024. Decreased aeration to the left lung base may reflect atelectasis or airspace disease. Mild blunting of the left costophrenic angle. Visualized osseous structures appear grossly intact. IMPRESSION: 1. Decreased aeration to the left lung base may reflect atelectasis or airspace disease. 2. Persistent mass-like opacity within the lingula is unchanged from the CT dated 05/31/2024. 3. Opacity within the apical segment of the left upper lobe corresponding to treated lung cancer. Electronically Signed   By: Waddell Calk M.D.   On: 06/09/2024 12:03    Nutritional status  All images have been reviewed by me personally.  EKG: Independently reviewed.  Nonischemic  Assessment/Plan Principal Problem:   Chest  pain Active Problems:   Coronary artery disease involving native coronary artery of native heart without angina pectoris   Chronic diastolic CHF (congestive heart failure) (HCC)   Second degree AV block, Mobitz type I   Chronic kidney disease, stage 3b (HCC)   GERD (gastroesophageal reflux disease)    Chest pain, concern for angina History of coronary artery disease -  Troponins remain negative, EKG is nonischemic.  Cardiac catheterization in 2020 had shown stable CAD.  There is a component of likely musculoskeletal discomfort.  Seen by cardiology team, no further inpatient workup indicated for now.  Will continue to monitor him.  Plans to medically optimize him. - Check A1c and lipid panel  Congestive heart failure with reduced ejection fraction, 40% - Echocardiogram 2024 showed EF 40%.  Continue and resume home medication once preadmission med rec has been completed  History of ventricular tachycardia status post ablation Sinus bradycardia with second-degree Mobitz type I block - Has a pacemaker in place.  Follows outpatient EP  Non-small cell lung cancer - Follow outpatient oncology  Paroxysmal atrial fibrillation - outptn Eliquis ??  Preadmission med rec is currently pending  DVT prophylaxis: Eliquis  Code Status: Full code Family Communication: None Consults called: [facility] cardiology Admission status: Observation telemetry  Status is: Observation The patient remains OBS appropriate and will d/c before 2 midnights.   Time Spent: 65 minutes.  >50% of the time was devoted to discussing the patients care, assessment, plan and disposition with other care givers along with counseling the patient about the risks and benefits of treatment.    Burgess JAYSON Dare MD [facility]  If 7PM-7AM, please contact night-coverage   06/09/2024, 3:44 PM

## 2024-06-09 NOTE — ED Provider Notes (Cosign Needed Addendum)
 [facility] AT [facility] Provider Note   CSN: [id number] Arrival date & time: 06/09/24  1026     Patient presents with: Chest Pain   [name] is a 83 y.o. male who presents to this ED today with substernal and left-sided chest discomfort over the last 12 hours.  He has a significant medical history with previous diagnoses of CAD, second-degree AV block type I, previous ICD placement, COPD.  He states that his pain worsens with movement, has been intermittent over the last 12 hours, has resolved with previous administration of nitroglycerin  at home.  He called EMS this morning due to severe chest pain in the lower left side of his chest, also states that he has pain in the left scapula as well.  Once he assumed a seated position and took his nitroglycerin  he stated that the pain is eased off and at presentation states that he does not have any pain, did have some shortness of breath with initial presentation of pain at home however does not have any dyspnea upon presentation.  He is currently anticoagulated on apixaban  secondary to previous history of atrial fibrillation.  Most recent echocardiogram was in June 2024, left ventricular ejection fraction was estimated at 35 to 40% at that time.    Chest Pain Associated symptoms: back pain        Prior to Admission medications   Medication Sig Start Date End Date Taking? Authorizing Provider  apixaban  (ELIQUIS ) 2.5 MG TABS tablet Take 1 tablet (2.5 mg total) by mouth 2 (two) times daily. 03/12/24   Croitoru, Mihai, MD  BREO ELLIPTA  100-25 MCG/INH AEPB Inhale 1 puff into the lungs every morning. 05/07/20   [provider]  brimonidine  (ALPHAGAN ) 0.2 % ophthalmic solution Place 1 drop into both eyes 2 (two) times daily. 03/29/23   [provider]  cycloSPORINE  (RESTASIS ) 0.05 % ophthalmic emulsion Place 1 drop into both eyes 2 (two) times daily.    [provider]   dorzolamide -timolol  (COSOPT ) 22.3-6.8 MG/ML ophthalmic solution Place 1 drop into both eyes 2 (two) times daily. 03/14/20   [provider]  ENTRESTO  24-26 MG TAKE 1 TABLET BY MOUTH TWICE DAILY 11/08/23   Burnard Debby LABOR, MD  furosemide  (LASIX ) 20 MG tablet Take 0.5 tablets (10 mg total) by mouth daily. 02/27/24   Lue Elsie BROCKS, MD  Glucagon , rDNA, (GLUCAGON  EMERGENCY) 1 MG KIT Inject 1 mg into the skin as needed for up to 2 doses (Severe low blood sugar). 02/26/24   Lue Elsie BROCKS, MD  hydrOXYzine  (ATARAX ) 10 MG tablet Take 1 tablet (10 mg total) by mouth at bedtime. 02/02/24   Heilingoetter, Cassandra L, PA-C  insulin  isophane & regular human KwikPen (HUMULIN 70/30 MIX) (70-30) 100 UNIT/ML KwikPen Inject 25 Units into the skin in the morning and at bedtime. 02/27/24   Lue Elsie BROCKS, MD  isosorbide  mononitrate (IMDUR ) 30 MG 24 hr tablet Take 30 mg by mouth daily. 03/18/24   [provider]  metoprolol  succinate (TOPROL -XL) 100 MG 24 hr tablet Take 100 mg by mouth daily. 05/09/24   [provider]  metoprolol  succinate (TOPROL -XL) 25 MG 24 hr tablet Take 0.5 tablets (12.5 mg total) by mouth daily. 02/26/24   Lue Elsie BROCKS, MD  ROCKLATAN  0.02-0.005 % SOLN Place 1 drop into both eyes at bedtime.    [provider]  spironolactone  (ALDACTONE ) 25 MG tablet Take 12.5 mg by mouth daily. 03/15/24   [provider]  Allergies: Paclitaxel , Allegra [fexofenadine], and Sonafine [wound dressings]    Review of Systems  Cardiovascular:  Positive for chest pain.  Musculoskeletal:  Positive for back pain.  All other systems reviewed and are negative.   Updated Vital Signs BP (!) 159/89   Pulse 72   Temp 98.9 F (37.2 C) (Oral)   Resp 15   Ht 5' 7 (1.702 m)   Wt 69.9 kg   SpO2 100%   BMI 24.12 kg/m   Physical Exam Vitals and nursing note reviewed.  Constitutional:      General: He is not in acute distress.    Appearance: Normal  appearance.  HENT:     Head: Normocephalic and atraumatic.     Mouth/Throat:     Mouth: Mucous membranes are moist.     Pharynx: Oropharynx is clear.  Eyes:     Extraocular Movements: Extraocular movements intact.     Conjunctiva/sclera: Conjunctivae normal.     Pupils: Pupils are equal, round, and reactive to light.  Cardiovascular:     Rate and Rhythm: Normal rate and regular rhythm.     Pulses: Normal pulses.          Radial pulses are 2+ on the right side and 2+ on the left side.     Heart sounds: Normal heart sounds, S1 normal and S2 normal. No murmur heard.    No friction rub. No gallop.  Pulmonary:     Effort: Pulmonary effort is normal.     Breath sounds: Normal breath sounds and air entry.  Abdominal:     General: Abdomen is flat. Bowel sounds are normal.     Palpations: Abdomen is soft.  Musculoskeletal:        General: Normal range of motion.     Cervical back: Normal range of motion and neck supple.     Right lower leg: No edema.     Left lower leg: No edema.  Skin:    General: Skin is warm and dry.     Capillary Refill: Capillary refill takes less than 2 seconds.  Neurological:     General: No focal deficit present.     Mental Status: He is alert. Mental status is at baseline.  Psychiatric:        Mood and Affect: Mood normal.     (all labs ordered are listed, but only abnormal results are displayed) Labs Reviewed  COMPREHENSIVE METABOLIC PANEL WITH GFR - Abnormal; Notable for the following components:      Result Value   Glucose, Bld 114 (*)    Creatinine, Ser 1.38 (*)    Albumin  3.3 (*)    GFR, Estimated 50 (*)    All other components within normal limits  CBC WITH DIFFERENTIAL/PLATELET - Abnormal; Notable for the following components:   WBC 3.5 (*)    RBC 3.89 (*)    Hemoglobin 11.5 (*)    HCT 35.9 (*)    Platelets 129 (*)    All other components within normal limits  PROTIME-INR - Abnormal; Notable for the following components:   Prothrombin  Time 17.3 (*)    INR 1.3 (*)    All other components within normal limits  APTT - Abnormal; Notable for the following components:   aPTT 39 (*)    All other components within normal limits  CBG MONITORING, ED - Abnormal; Notable for the following components:   Glucose-Capillary 132 (*)    All other components within normal limits  MAGNESIUM   TROPONIN  I (HIGH SENSITIVITY)  TROPONIN I (HIGH SENSITIVITY)    EKG: EKG Interpretation Date/Time:  Saturday June 09 2024 10:38:35 EDT Ventricular Rate:  74 PR Interval:    QRS Duration:  157 QT Interval:  448 QTC Calculation: 498 R Axis:   267  Text Interpretation: AV PACED RHYTHM No significant change since last tracing 23 February 2024 Confirmed by Levander Houston [id number]) on 06/09/2024 10:42:46 AM  Radiology: [facility] Chest Portable 1 View Result Date: 06/09/2024 CLINICAL DATA:  Chest pain.  History of non-small cell lung cancer. EXAM: PORTABLE CHEST 1 VIEW COMPARISON:  CT 05/31/2024 FINDINGS: Left chest wall ICD noted with leads in the right atrial appendage, coronary sinus and right ventricle. Heart size and mediastinal contours are stable. No significant pleural effusion. Opacity within the apical segment of the left upper lobe corresponding to treated lung cancer. Persistent masslike opacity within the lingula is unchanged from the CT dated 05/31/2024. Decreased aeration to the left lung base may reflect atelectasis or airspace disease. Mild blunting of the left costophrenic angle. Visualized osseous structures appear grossly intact. IMPRESSION: 1. Decreased aeration to the left lung base may reflect atelectasis or airspace disease. 2. Persistent mass-like opacity within the lingula is unchanged from the CT dated 05/31/2024. 3. Opacity within the apical segment of the left upper lobe corresponding to treated lung cancer. Electronically Signed   By: Waddell Calk M.D.   On: 06/09/2024 12:03     Procedures   Medications Ordered in the ED - No data to  display                                  Medical Decision Making Amount and/or Complexity of Data Reviewed Labs: ordered. Radiology: ordered.  Risk Decision regarding hospitalization.   Medical Decision Making:   KHALE NIGH is a 83 y.o. male who presented to the ED today with chest pain detailed above.    External chart has been reviewed including previous echo records as well as previous imaging and lab results.. Patient's presentation is complicated by their history of CAD, COPD, cardiac arrhythmia.  Patient placed on continuous vitals and telemetry monitoring while in ED which was reviewed periodically.  Complete initial physical exam performed, notably the patient  was alert and oriented in no apparent distress.  Physical exam as noted, no notable findings at this time..    Reviewed and confirmed nursing documentation for past medical history, family history, social history.    Initial Assessment:   With the patient's presentation of chest pain, most likely diagnosis is coronary artery disease/unstable angina.  Further consider diagnosis of STEMI/NSTEMI, myocarditis, pericarditis.  Initial Plan:  Collect PT/INR and PTT secondary to chronic anticoagulation. Screening labs including CBC and Metabolic panel to evaluate for infectious or metabolic etiology of disease.  Also add magnesium  level. CXR to evaluate for structural/infectious intrathoracic pathology.  EKG/Troponin testing/BNP testing to evaluate for cardiac pathology. Patient declined aspirin  administration secondary to previous anticoagulation status. Objective evaluation as below reviewed   Initial Study Results:   Laboratory  All laboratory results reviewed without evidence of clinically relevant pathology.   Exceptions include: Pancytopenia is noted however this is stable compared to previous labs drawn in in the last month.  Creatinine is elevated to 1.38 however this is also stable to previous lab results is  actually decreased.  EKG EKG was reviewed independently. Rate, rhythm, axis, intervals all examined and without medically relevant  abnormality. ST segments without concerns for elevations.    Radiology:  All images reviewed independently. Agree with radiology report at this time.   DG Chest Portable 1 View Result Date: 06/09/2024 CLINICAL DATA:  Chest pain.  History of non-small cell lung cancer. EXAM: PORTABLE CHEST 1 VIEW COMPARISON:  CT 05/31/2024 FINDINGS: Left chest wall ICD noted with leads in the right atrial appendage, coronary sinus and right ventricle. Heart size and mediastinal contours are stable. No significant pleural effusion. Opacity within the apical segment of the left upper lobe corresponding to treated lung cancer. Persistent masslike opacity within the lingula is unchanged from the CT dated 05/31/2024. Decreased aeration to the left lung base may reflect atelectasis or airspace disease. Mild blunting of the left costophrenic angle. Visualized osseous structures appear grossly intact. IMPRESSION: 1. Decreased aeration to the left lung base may reflect atelectasis or airspace disease. 2. Persistent mass-like opacity within the lingula is unchanged from the CT dated 05/31/2024. 3. Opacity within the apical segment of the left upper lobe corresponding to treated lung cancer. Electronically Signed   By: Waddell Calk M.D.   On: 06/09/2024 12:03   CT Chest Wo Contrast Result Date: 06/04/2024 CLINICAL DATA:  Non-small cell lung cancer restaging, chemotherapy and XRT complete * Tracking Code: BO * EXAM: CT CHEST WITHOUT CONTRAST TECHNIQUE: Multidetector CT imaging of the chest was performed following the standard protocol without IV contrast. RADIATION DOSE REDUCTION: This exam was performed according to the departmental dose-optimization program which includes automated exposure control, adjustment of the mA and/or kV according to patient size and/or use of iterative reconstruction  technique. COMPARISON:  10/18/2023 FINDINGS: Cardiovascular: Aortic atherosclerosis. Left chest multi lead pacer defibrillator normal heart size. Three-vessel coronary artery calcifications. No pericardial effusion. Mediastinum/Nodes: Interval increase in ill-defined infiltrative soft tissue in the left hilum and AP window (series 2, image 53, series 7, image 69). Discretely measurable lymph nodes are however similar, pretracheal node measuring 2.4 x 1.0 cm (series 2, image 63). Thyroid  gland, trachea, and esophagus demonstrate no significant findings. Lungs/Pleura: New small left pleural effusion and associated atelectasis or consolidation. No significant change in a treated nodule of the posterior left pulmonary apex, measuring 2.4 x 2.3 cm (series 6, image 32). Significant interval increase in size of a masslike consolidation in the lingula containing air bronchograms, measuring 5.5 x 3.8 cm, previously no greater than 3.6 x 1.6 cm (series 6, image 82). Background of mild emphysema. Upper Abdomen: No acute abnormality.  Nonobstructive renal calculi. Musculoskeletal: No chest wall abnormality. No acute osseous findings. IMPRESSION: 1. Significant interval increase in size of a masslike consolidation in the lingula containing air bronchograms, measuring 5.5 x 3.8 cm, previously no greater than 3.6 x 1.6 cm. 2. New small left pleural effusion and associated atelectasis or consolidation. 3. Interval increase in ill-defined infiltrative soft tissue in the left hilum and AP window. Discretely measurable lymph nodes are however similar. 4. Constellation of findings is consistent with progression of lung malignancy. 5. No significant change in a treated nodule of the posterior left pulmonary apex, measuring 2.4 x 2.3 cm. 6. Emphysema. 7. Coronary artery disease. Aortic Atherosclerosis (ICD10-I70.0) and Emphysema (ICD10-J43.9). Electronically Signed   By: Marolyn JONETTA Jaksch M.D.   On: 06/04/2024 07:10      Consults: Case  discussed with Dr. Caleen with hospitalist team who accepts patient for admission..  Further consulted with Dr. Jeffrie with cardiology who will be following patient during his inpatient admission.  Reassessment and Plan:  Review of objective data does not show any elevation of troponin and chest x-ray is stable compared to previous CT findings.       Final diagnoses:  Unstable angina [facility])    ED Discharge Orders     None          Myriam Dorn BROCKS, PA 06/09/24 1548    Myriam Dorn BROCKS, [place] 06/09/24 1553    Levander Houston, MD 06/14/24 1148

## 2024-06-09 NOTE — Plan of Care (Signed)

## 2024-06-10 ENCOUNTER — Encounter (HOSPITAL_COMMUNITY): Payer: Self-pay | Admitting: Internal Medicine

## 2024-06-10 DIAGNOSIS — I25110 Atherosclerotic heart disease of native coronary artery with unstable angina pectoris: Secondary | ICD-10-CM | POA: Diagnosis not present

## 2024-06-10 DIAGNOSIS — N1831 Chronic kidney disease, stage 3a: Secondary | ICD-10-CM | POA: Diagnosis not present

## 2024-06-10 DIAGNOSIS — R079 Chest pain, unspecified: Secondary | ICD-10-CM

## 2024-06-10 DIAGNOSIS — I5032 Chronic diastolic (congestive) heart failure: Secondary | ICD-10-CM | POA: Diagnosis not present

## 2024-06-10 DIAGNOSIS — R0789 Other chest pain: Secondary | ICD-10-CM | POA: Diagnosis not present

## 2024-06-10 DIAGNOSIS — R071 Chest pain on breathing: Secondary | ICD-10-CM

## 2024-06-10 DIAGNOSIS — I5022 Chronic systolic (congestive) heart failure: Secondary | ICD-10-CM | POA: Diagnosis not present

## 2024-06-10 DIAGNOSIS — I441 Atrioventricular block, second degree: Secondary | ICD-10-CM | POA: Diagnosis not present

## 2024-06-10 LAB — CBC
HCT: 34.2 % — ABNORMAL LOW (ref 39.0–52.0)
Hemoglobin: 11.4 g/dL — ABNORMAL LOW (ref 13.0–17.0)
MCH: 29.8 pg (ref 26.0–34.0)
MCHC: 33.3 g/dL (ref 30.0–36.0)
MCV: 89.3 fL (ref 80.0–100.0)
Platelets: 148 K/uL — ABNORMAL LOW (ref 150–400)
RBC: 3.83 MIL/uL — ABNORMAL LOW (ref 4.22–5.81)
RDW: 12.5 % (ref 11.5–15.5)
WBC: 4.1 K/uL (ref 4.0–10.5)
nRBC: 0 % (ref 0.0–0.2)

## 2024-06-10 LAB — BASIC METABOLIC PANEL WITH GFR
Anion gap: 10 (ref 5–15)
BUN: 15 mg/dL (ref 8–23)
CO2: 21 mmol/L — ABNORMAL LOW (ref 22–32)
Calcium: 9.2 mg/dL (ref 8.9–10.3)
Chloride: 105 mmol/L (ref 98–111)
Creatinine, Ser: 1.26 mg/dL — ABNORMAL HIGH (ref 0.61–1.24)
GFR, Estimated: 56 mL/min — ABNORMAL LOW (ref >60)
Glucose, Bld: 113 mg/dL — ABNORMAL HIGH (ref 70–99)
Potassium: 3.8 mmol/L (ref 3.5–5.1)
Sodium: 136 mmol/L (ref 135–145)

## 2024-06-10 LAB — MAGNESIUM: Magnesium: 1.7 mg/dL (ref 1.7–2.4)

## 2024-06-10 LAB — GLUCOSE, CAPILLARY: Glucose-Capillary: 176 mg/dL — ABNORMAL HIGH (ref 70–99)

## 2024-06-10 MED ORDER — LIDOCAINE 5 % EX PTCH: 1.0000 | MEDICATED_PATCH | CUTANEOUS | 0 refills | Status: AC

## 2024-06-10 NOTE — Plan of Care (Signed)

## 2024-06-10 NOTE — Plan of Care (Signed)

## 2024-06-10 NOTE — Progress Notes (Signed)
  Progress Note  Patient Name: [name] Date of Encounter: 06/10/2024 [facility] Cardiologist: Jerel Balding, MD   Interval Summary   Feeling better Tender chest wall to palpation --MSK Vital Signs Vitals:   06/10/24 0309 06/10/24 0328 06/10/24 0500 06/10/24 0802  BP: (!) 160/89 126/79    Pulse: 73 73    Resp:  18    Temp:      TempSrc:      SpO2: 100% 97%  97%  Weight:   69.7 kg   Height:        Intake/Output Summary (Last 24 hours) at 06/10/2024 0910 Last data filed at 06/09/2024 2341 Gross per 24 hour  Intake 240 ml  Output 300 ml  Net -60 ml      06/10/2024    5:00 AM 06/09/2024   10:38 AM 05/17/2024    8:15 AM  Last 3 Weights  Weight (lbs) 153 lb 10.6 oz 154 lb 154 lb 12.8 oz  Weight (kg) 69.7 kg 69.854 kg 70.217 kg      Telemetry/ECG  AV pacing- Personally Reviewed  Physical Exam  GEN: No acute distress.   Neck: No JVD Cardiac: RRR, no murmurs, rubs, or gallops.  Respiratory: Clear to auscultation bilaterally. 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 Kelsie, CRT-D device [facility] Jude/defibrillator, Non-small cell lung cancer chemotherapy Dr. Gatha, hyperlipidemia LDL 55, paroxysmal atrial fibrillation on apixaban  2.5 mg twice a day burden 1.5 cm of A-fib on pacer interrogation  -Chest discomfort was reproducible with palpation to chest wall.  States that he felt it with movement.  Likely musculoskeletal in etiology.  Troponins have been normal.  3 separate have been checked.  I would be comfortable with his discharge.  Follow-up with Dr. Eldred team.  May not require stress testing unless symptoms become more  worrisome in the future.  For questions or updates, please contact [facility] Please consult [email or web address] for contact info under       Signed, Oneil Parchment, MD

## 2024-06-10 NOTE — Discharge Summary (Signed)
 Physician Discharge Summary  [name] FMW:[id number] DOB: [birth year] DOA: 06/09/2024  PCP: [name], MD  Admit date: 06/09/2024 Discharge date: 06/10/2024  Admitted From: Home Disposition: Home  Recommendations for Outpatient Follow-up:  Follow up with PCP in 1-2 weeks Please obtain BMP/CBC in one week your next doctors visit.  Follow-up outpatient cardiology and oncology Lidocaine  patch prescribed, hopefully this is affordable for him   Discharge Condition: Stable CODE STATUS: Full code Diet recommendation: Cardiac  Brief/Interim Summary: Brief Narrative:  83 y.o. male with medical history significant of congestive heart failure with EF 45% with defibrillator, prior ventricular tachycardia, nonobstructive CAD, non-small cell lung cancer comes to the hospital with complaints of ongoing chest pain.  Upon admission EKG was unremarkable, troponins remained flat, seen by cardiology team.  Due to his high risk factors he was observed in the hospital overnight.  There appeared to be a component of musculoskeletal pain therefore added lidocaine  patch. Today patient is cleared by cardiology for discharge.  Will discharge him in stable condition  Assessment & Plan:  Principal Problem:   Chest pain Active Problems:   Coronary artery disease involving native coronary artery of native heart without angina pectoris   Chronic diastolic CHF (congestive heart failure) (HCC)   Second degree AV block, Mobitz type I   Chronic kidney disease, stage 3b (HCC)   GERD (gastroesophageal reflux disease)     Chest pain, concern for angina History of coronary artery disease - Troponins remain negative, EKG is nonischemic.  Cardiac catheterization in 2020 had shown stable CAD.  Seen by cardiology team, no further workup indicated at this time.  Appears to have component of musculoskeletal pain therefore lidocaine  patch has been added.  A1c 5.2, LDL 39.   Congestive heart failure with reduced  ejection fraction, 40% - Echocardiogram 2024 showed EF 40%.  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  Follows outpatient EP   Non-small cell lung cancer - Follow outpatient oncology   Paroxysmal atrial fibrillation - outptn Eliquis ??    DVT prophylaxis: enoxaparin  (LOVENOX ) injection 40 mg Start: 06/09/24 1800      Code Status: Full Code Family Communication:   Hopefully discharge once cleared by cardiology today    Subjective:  Seen and examined at bedside feeling much better.  No complaints  Examination:  General exam: Appears calm and comfortable  Respiratory system: Clear to auscultation. Respiratory effort normal. Cardiovascular system: S1 & S2 heard, RRR. No JVD, murmurs, rubs, gallops or clicks. No pedal edema. Gastrointestinal system: Abdomen is nondistended, soft and nontender. No organomegaly or masses felt. Normal bowel sounds heard. Central nervous system: Alert and oriented. No focal neurological deficits. Extremities: Symmetric 5 x 5 power. Skin: No rashes, lesions or ulcers Psychiatry: Judgement and insight appear normal. Mood & affect appropriate.    Discharge Diagnoses:  Principal Problem:   Chest pain Active Problems:   Coronary artery disease involving native coronary artery of native heart without angina pectoris   Chronic diastolic CHF (congestive heart failure) (HCC)   Second degree AV block, Mobitz type I   Chronic kidney disease, stage 3b (HCC)   GERD (gastroesophageal reflux disease)      Discharge Exam: Vitals:   06/10/24 0328 06/10/24 0802  BP: 126/79   Pulse: 73   Resp: 18   Temp:    SpO2: 97% 97%   Vitals:   06/10/24 0309  06/10/24 0328 06/10/24 0500 06/10/24 0802  BP: (!) 160/89 126/79    Pulse: 73 73    Resp:  18    Temp:      TempSrc:      SpO2: 100% 97%  97%  Weight:   69.7 kg    Height:          Discharge Instructions   Allergies as of 06/10/2024       Reactions   Paclitaxel  Other (See Comments), Hypertension   Pt reported chest pain, back pain, and SOB during first infusion (see notes from 9/23). Infusion discontinued.    Allegra [fexofenadine] Other (See Comments)   Makes him dizzy and feels like he will fall.   Sonafine [wound Dressings] Itching   Itching and burning to skin  08/25/23        Medication List     TAKE these medications    apixaban  2.5 MG Tabs tablet Commonly known as: Eliquis  Take 1 tablet (2.5 mg total) by mouth 2 (two) times daily.   Breo Ellipta  100-25 MCG/INH Aepb Generic drug: fluticasone  furoate-vilanterol Inhale 1 puff into the lungs every morning.   brimonidine  0.2 % ophthalmic solution Commonly known as: ALPHAGAN  Place 1 drop into both eyes 2 (two) times daily.   cycloSPORINE  0.05 % ophthalmic emulsion Commonly known as: RESTASIS  Place 1 drop into both eyes 2 (two) times daily.   dorzolamide -timolol  2-0.5 % ophthalmic solution Commonly known as: COSOPT  Place 1 drop into both eyes 2 (two) times daily.   Entresto  24-26 MG Generic drug: sacubitril -valsartan  TAKE 1 TABLET BY MOUTH TWICE DAILY   furosemide  20 MG tablet Commonly known as: LASIX  Take 0.5 tablets (10 mg total) by mouth daily.   Glucagon  Emergency 1 MG Kit Inject 1 mg into the skin as needed for up to 2 doses (Severe low blood sugar).   hydrOXYzine  10 MG tablet Commonly known as: ATARAX  Take 1 tablet (10 mg total) by mouth at bedtime.   insulin  isophane & regular human KwikPen (70-30) 100 UNIT/ML KwikPen Commonly known as: HUMULIN 70/30 MIX Inject 25 Units into the skin in the morning and at bedtime.   isosorbide  mononitrate 30 MG 24 hr tablet Commonly known as: IMDUR  Take 30 mg by mouth daily.   lidocaine  5 % Commonly known as: LIDODERM  Place 1 patch onto the skin daily. Remove & Discard patch within 12 hours or as directed by MD    metoprolol  succinate 25 MG 24 hr tablet Commonly known as: TOPROL -XL Take 0.5 tablets (12.5 mg total) by mouth daily. What changed: Another medication with the same name was removed. Continue taking this medication, and follow the directions you see here.   Rocklatan  0.02-0.005 % Soln Generic drug: Netarsudil -Latanoprost  Place 1 drop into both eyes at bedtime.   spironolactone  25 MG tablet Commonly known as: ALDACTONE  Take 12.5 mg by mouth daily.        Allergies  Allergen Reactions   Paclitaxel  Other (See Comments) and Hypertension    Pt reported chest pain, back pain, and SOB during first infusion (see notes from 9/23). Infusion discontinued.    Allegra [Fexofenadine] Other (See Comments)    Makes him dizzy and feels like he will fall.   Sonafine [Wound Dressings] Itching    Itching and burning to skin  08/25/23    You were cared for by a hospitalist during your hospital stay. If you have any questions about your discharge medications or the care you received while you were in the hospital  after you are discharged, you can call the unit and asked to speak with the hospitalist on call if the hospitalist that took care of you is not available. Once you are discharged, your primary care physician will handle any further medical issues. Please note that no refills for any discharge medications will be authorized once you are discharged, as it is imperative that you return to your primary care physician (or establish a relationship with a primary care physician if you do not have one) for your aftercare needs so that they can reassess your need for medications and monitor your lab values.  You were cared for by a hospitalist during your hospital stay. If you have any questions about your discharge medications or the care you received while you were in the hospital after you are discharged, you can call the unit and asked to speak with the hospitalist on call if the hospitalist that took  care of you is not available. Once you are discharged, your primary care physician will handle any further medical issues. Please note that NO REFILLS for any discharge medications will be authorized once you are discharged, as it is imperative that you return to your primary care physician (or establish a relationship with a primary care physician if you do not have one) for your aftercare needs so that they can reassess your need for medications and monitor your lab values.  Please request your Prim.MD to go over all Hospital Tests and Procedure/Radiological results at the follow up, please get all Hospital records sent to your Prim MD by signing hospital release before you go home.  Get CBC, CMP, 2 view Chest X ray checked  by Primary MD during your next visit or SNF MD in 5-7 days ( we routinely change or add medications that can affect your baseline labs and fluid status, therefore we recommend that you get the mentioned basic workup next visit with your PCP, your PCP may decide not to get them or add new tests based on their clinical decision)  On your next visit with your primary care physician please Get Medicines reviewed and adjusted.  If you experience worsening of your admission symptoms, develop shortness of breath, life threatening emergency, suicidal or homicidal thoughts you must seek medical attention immediately by calling 911 or calling your MD immediately  if symptoms less severe.  You Must read complete instructions/literature along with all the possible adverse reactions/side effects for all the Medicines you take and that have been prescribed to you. Take any new Medicines after you have completely understood and accpet all the possible adverse reactions/side effects.   Do not drive, operate heavy machinery, perform activities at heights, swimming or participation in water  activities or provide baby sitting services if your were admitted for syncope or siezures until you have seen  by Primary MD or a Neurologist and advised to do so again.  Do not drive when taking Pain medications.   Procedures/Studies: DG Chest Portable 1 View Result Date: 06/09/2024 CLINICAL DATA:  Chest pain.  History of non-small cell lung cancer. EXAM: PORTABLE CHEST 1 VIEW COMPARISON:  CT 05/31/2024 FINDINGS: Left chest wall ICD noted with leads in the right atrial appendage, coronary sinus and right ventricle. Heart size and mediastinal contours are stable. No significant pleural effusion. Opacity within the apical segment of the left upper lobe corresponding to treated lung cancer. Persistent masslike opacity within the lingula is unchanged from the CT dated 05/31/2024. Decreased aeration to the left lung base  may reflect atelectasis or airspace disease. Mild blunting of the left costophrenic angle. Visualized osseous structures appear grossly intact. IMPRESSION: 1. Decreased aeration to the left lung base may reflect atelectasis or airspace disease. 2. Persistent mass-like opacity within the lingula is unchanged from the CT dated 05/31/2024. 3. Opacity within the apical segment of the left upper lobe corresponding to treated lung cancer. Electronically Signed   By: Waddell Calk M.D.   On: 06/09/2024 12:03   CT Chest Wo Contrast Result Date: 06/04/2024 CLINICAL DATA:  Non-small cell lung cancer restaging, chemotherapy and XRT complete * Tracking Code: BO * EXAM: CT CHEST WITHOUT CONTRAST TECHNIQUE: Multidetector CT imaging of the chest was performed following the standard protocol without IV contrast. RADIATION DOSE REDUCTION: This exam was performed according to the departmental dose-optimization program which includes automated exposure control, adjustment of the mA and/or kV according to patient size and/or use of iterative reconstruction technique. COMPARISON:  10/18/2023 FINDINGS: Cardiovascular: Aortic atherosclerosis. Left chest multi lead pacer defibrillator normal heart size. Three-vessel coronary  artery calcifications. No pericardial effusion. Mediastinum/Nodes: Interval increase in ill-defined infiltrative soft tissue in the left hilum and AP window (series 2, image 53, series 7, image 69). Discretely measurable lymph nodes are however similar, pretracheal node measuring 2.4 x 1.0 cm (series 2, image 63). Thyroid  gland, trachea, and esophagus demonstrate no significant findings. Lungs/Pleura: New small left pleural effusion and associated atelectasis or consolidation. No significant change in a treated nodule of the posterior left pulmonary apex, measuring 2.4 x 2.3 cm (series 6, image 32). Significant interval increase in size of a masslike consolidation in the lingula containing air bronchograms, measuring 5.5 x 3.8 cm, previously no greater than 3.6 x 1.6 cm (series 6, image 82). Background of mild emphysema. Upper Abdomen: No acute abnormality.  Nonobstructive renal calculi. Musculoskeletal: No chest wall abnormality. No acute osseous findings. IMPRESSION: 1. Significant interval increase in size of a masslike consolidation in the lingula containing air bronchograms, measuring 5.5 x 3.8 cm, previously no greater than 3.6 x 1.6 cm. 2. New small left pleural effusion and associated atelectasis or consolidation. 3. Interval increase in ill-defined infiltrative soft tissue in the left hilum and AP window. Discretely measurable lymph nodes are however similar. 4. Constellation of findings is consistent with progression of lung malignancy. 5. No significant change in a treated nodule of the posterior left pulmonary apex, measuring 2.4 x 2.3 cm. 6. Emphysema. 7. Coronary artery disease. Aortic Atherosclerosis (ICD10-I70.0) and Emphysema (ICD10-J43.9). Electronically Signed   By: Marolyn JONETTA Jaksch M.D.   On: 06/04/2024 07:10     The results of significant diagnostics from this hospitalization (including imaging, microbiology, ancillary and laboratory) are listed below for reference.     Microbiology: No  results found for this or any previous visit (from the past 240 hours).   Labs: BNP (last 3 results) No results for input(s): BNP in the last 8760 hours. Basic Metabolic Panel: Recent Labs  Lab 06/09/24 1105 06/09/24 1700 06/10/24 0345  NA 139  --  136  K 4.0  --  3.8  CL 104  --  105  CO2 23  --  21*  GLUCOSE 114*  --  113*  BUN 15  --  15  CREATININE 1.38* 1.20 1.26*  CALCIUM  9.2  --  9.2  MG 2.0  --  1.7   Liver Function Tests: Recent Labs  Lab 06/09/24 1105  AST 20  ALT 10  ALKPHOS 55  BILITOT 0.7  PROT 7.5  ALBUMIN  3.3*   No results for input(s): LIPASE, AMYLASE in the last 168 hours. No results for input(s): AMMONIA in the last 168 hours. CBC: Recent Labs  Lab 06/09/24 1105 06/09/24 1700 06/10/24 0345  WBC 3.5* 3.5* 4.1  NEUTROABS 2.2  --   --   HGB 11.5* 12.2* 11.4*  HCT 35.9* 37.4* 34.2*  MCV 92.3 91.4 89.3  PLT 129* 138* 148*   Cardiac Enzymes: No results for input(s): CKTOTAL, CKMB, CKMBINDEX, TROPONINI in the last 168 hours. BNP: Invalid input(s): POCBNP CBG: Recent Labs  Lab 06/09/24 1111 06/09/24 2114 06/10/24 0839  GLUCAP 132* 101* 176*   D-Dimer No results for input(s): DDIMER in the last 72 hours. Hgb A1c Recent Labs    06/09/24 1700  HGBA1C 5.2   Lipid Profile Recent Labs    06/09/24 1700  CHOL 102  HDL 41  LDLCALC 53  TRIG 39  CHOLHDL 2.5   Thyroid  function studies No results for input(s): TSH, T4TOTAL, T3FREE, THYROIDAB in the last 72 hours.  Invalid input(s): FREET3 Anemia work up No results for input(s): VITAMINB12, FOLATE, FERRITIN, TIBC, IRON, RETICCTPCT in the last 72 hours. Urinalysis    Component Value Date/Time   COLORURINE YELLOW 02/23/2024 1145   APPEARANCEUR CLEAR 02/23/2024 1145   LABSPEC 1.010 02/23/2024 1145   PHURINE 6.0 02/23/2024 1145   GLUCOSEU >=500 (A) 02/23/2024 1145   HGBUR TRACE (A) 02/23/2024 1145   BILIRUBINUR NEGATIVE 02/23/2024 1145    KETONESUR NEGATIVE 02/23/2024 1145   PROTEINUR NEGATIVE 02/23/2024 1145   UROBILINOGEN 0.2 11/22/2011 0247   NITRITE NEGATIVE 02/23/2024 1145   LEUKOCYTESUR NEGATIVE 02/23/2024 1145   Sepsis Labs Recent Labs  Lab 06/09/24 1105 06/09/24 1700 06/10/24 0345  WBC 3.5* 3.5* 4.1   Microbiology No results found for this or any previous visit (from the past 240 hours).   Time coordinating discharge:  I have spent 35 minutes face to face with the patient and on the ward discussing the patients care, assessment, plan and disposition with other care givers. >50% of the time was devoted counseling the patient about the risks and benefits of treatment/Discharge disposition and coordinating care.   SIGNED:   Burgess JAYSON Dare, MD  [facility] 06/10/2024, 10:51 AM   If 7PM-7AM, please contact night-coverage

## 2024-06-10 NOTE — Hospital Course (Addendum)
 Brief Narrative:  83 y.o. male with medical history significant of congestive heart failure with EF 45% with defibrillator, prior ventricular tachycardia, nonobstructive CAD, non-small cell lung cancer comes to the hospital with complaints of ongoing chest pain.  Upon admission EKG was unremarkable, troponins remained flat, seen by cardiology team.  Due to his high risk factors he was observed in the hospital overnight.  There appeared to be a component of musculoskeletal pain therefore added lidocaine  patch. Today patient is cleared by cardiology for discharge.  Will discharge him in stable condition  Assessment & Plan:  Principal Problem:   Chest pain Active Problems:   Coronary artery disease involving native coronary artery of native heart without angina pectoris   Chronic diastolic CHF (congestive heart failure) (HCC)   Second degree AV block, Mobitz type I   Chronic kidney disease, stage 3b (HCC)   GERD (gastroesophageal reflux disease)     Chest pain, concern for angina History of coronary artery disease - Troponins remain negative, EKG is nonischemic.  Cardiac catheterization in 2020 had shown stable CAD.  Seen by cardiology team, no further workup indicated at this time.  Appears to have component of musculoskeletal pain therefore lidocaine  patch has been added.  A1c 5.2, LDL 39.   Congestive heart failure with reduced ejection fraction, 40% - Echocardiogram 2024 showed EF 40%.  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  Follows outpatient EP   Non-small cell lung cancer - Follow outpatient oncology   Paroxysmal atrial fibrillation - outptn Eliquis ??    DVT prophylaxis: enoxaparin  (LOVENOX ) injection 40 mg Start: 06/09/24 1800      Code Status: Full Code Family Communication:   Hopefully discharge once cleared by cardiology  today    Subjective:  Seen and examined at bedside feeling much better.  No complaints  Examination:  General exam: Appears calm and comfortable  Respiratory system: Clear to auscultation. Respiratory effort normal. Cardiovascular system: S1 & S2 heard, RRR. No JVD, murmurs, rubs, gallops or clicks. No pedal edema. Gastrointestinal system: Abdomen is nondistended, soft and nontender. No organomegaly or masses felt. Normal bowel sounds heard. Central nervous system: Alert and oriented. No focal neurological deficits. Extremities: Symmetric 5 x 5 power. Skin: No rashes, lesions or ulcers Psychiatry: Judgement and insight appear normal. Mood & affect appropriate.

## 2024-06-11 ENCOUNTER — Ambulatory Visit

## 2024-06-11 DIAGNOSIS — I441 Atrioventricular block, second degree: Secondary | ICD-10-CM

## 2024-06-13 ENCOUNTER — Inpatient Hospital Stay

## 2024-06-13 ENCOUNTER — Inpatient Hospital Stay: Attending: Internal Medicine | Admitting: Internal Medicine

## 2024-06-13 VITALS — BP 136/77 | HR 72 | Temp 97.3°F | Resp 17 | Ht 67.0 in | Wt 153.0 lb

## 2024-06-13 DIAGNOSIS — I700 Atherosclerosis of aorta: Secondary | ICD-10-CM | POA: Diagnosis not present

## 2024-06-13 DIAGNOSIS — N200 Calculus of kidney: Secondary | ICD-10-CM | POA: Insufficient documentation

## 2024-06-13 DIAGNOSIS — Z9226 Personal history of immune checkpoint inhibitor therapy: Secondary | ICD-10-CM | POA: Diagnosis not present

## 2024-06-13 DIAGNOSIS — J439 Emphysema, unspecified: Secondary | ICD-10-CM | POA: Insufficient documentation

## 2024-06-13 DIAGNOSIS — N1832 Chronic kidney disease, stage 3b: Secondary | ICD-10-CM | POA: Diagnosis not present

## 2024-06-13 DIAGNOSIS — M25519 Pain in unspecified shoulder: Secondary | ICD-10-CM | POA: Insufficient documentation

## 2024-06-13 DIAGNOSIS — R739 Hyperglycemia, unspecified: Secondary | ICD-10-CM | POA: Insufficient documentation

## 2024-06-13 DIAGNOSIS — T380X5A Adverse effect of glucocorticoids and synthetic analogues, initial encounter: Secondary | ICD-10-CM | POA: Insufficient documentation

## 2024-06-13 DIAGNOSIS — C3412 Malignant neoplasm of upper lobe, left bronchus or lung: Secondary | ICD-10-CM | POA: Insufficient documentation

## 2024-06-13 DIAGNOSIS — Z923 Personal history of irradiation: Secondary | ICD-10-CM | POA: Insufficient documentation

## 2024-06-13 DIAGNOSIS — Z7901 Long term (current) use of anticoagulants: Secondary | ICD-10-CM | POA: Diagnosis not present

## 2024-06-13 DIAGNOSIS — I129 Hypertensive chronic kidney disease with stage 1 through stage 4 chronic kidney disease, or unspecified chronic kidney disease: Secondary | ICD-10-CM | POA: Diagnosis not present

## 2024-06-13 DIAGNOSIS — Z79899 Other long term (current) drug therapy: Secondary | ICD-10-CM | POA: Diagnosis not present

## 2024-06-13 DIAGNOSIS — I252 Old myocardial infarction: Secondary | ICD-10-CM | POA: Diagnosis not present

## 2024-06-13 DIAGNOSIS — R590 Localized enlarged lymph nodes: Secondary | ICD-10-CM | POA: Insufficient documentation

## 2024-06-13 DIAGNOSIS — Z9221 Personal history of antineoplastic chemotherapy: Secondary | ICD-10-CM | POA: Insufficient documentation

## 2024-06-13 DIAGNOSIS — Z87442 Personal history of urinary calculi: Secondary | ICD-10-CM | POA: Insufficient documentation

## 2024-06-13 DIAGNOSIS — Z860102 Personal history of hyperplastic colon polyps: Secondary | ICD-10-CM | POA: Insufficient documentation

## 2024-06-13 LAB — CUP PACEART REMOTE DEVICE CHECK
Battery Remaining Longevity: 28 mo
Battery Remaining Percentage: 36 %
Battery Voltage: 2.92 V
Brady Statistic AP VP Percent: 95 %
Brady Statistic AP VS Percent: 1.4 %
Brady Statistic AS VP Percent: 1.8 %
Brady Statistic AS VS Percent: 1 %
Brady Statistic RA Percent Paced: 93 %
Date Time Interrogation Session: 20250719110114
HighPow Impedance: 60 Ohm
Implantable Lead Connection Status: 753985
Implantable Lead Connection Status: 753985
Implantable Lead Connection Status: 753985
Implantable Lead Implant Date: 20190821
Implantable Lead Implant Date: 20201217
Implantable Lead Implant Date: 20201217
Implantable Lead Location: 753858
Implantable Lead Location: 753859
Implantable Lead Location: 753860
Implantable Pulse Generator Implant Date: 20201217
Lead Channel Impedance Value: 350 Ohm
Lead Channel Impedance Value: 400 Ohm
Lead Channel Impedance Value: 480 Ohm
Lead Channel Pacing Threshold Amplitude: 0.5 V
Lead Channel Pacing Threshold Amplitude: 0.5 V
Lead Channel Pacing Threshold Amplitude: 1.25 V
Lead Channel Pacing Threshold Pulse Width: 0.4 ms
Lead Channel Pacing Threshold Pulse Width: 0.5 ms
Lead Channel Pacing Threshold Pulse Width: 0.5 ms
Lead Channel Sensing Intrinsic Amplitude: 1.7 mV
Lead Channel Sensing Intrinsic Amplitude: 6.7 mV
Lead Channel Setting Pacing Amplitude: 2 V
Lead Channel Setting Pacing Amplitude: 2 V
Lead Channel Setting Pacing Amplitude: 2.5 V
Lead Channel Setting Pacing Pulse Width: 0.5 ms
Lead Channel Setting Pacing Pulse Width: 0.5 ms
Lead Channel Setting Sensing Sensitivity: 0.5 mV
Pulse Gen Serial Number: 111014567

## 2024-06-13 MED ORDER — ONDANSETRON HCL 8 MG PO TABS: 8.0000 mg | ORAL_TABLET | Freq: Three times a day (TID) | ORAL | 1 refills | Status: AC | PRN

## 2024-06-13 MED ORDER — FOLIC ACID 1 MG PO TABS: 1.0000 mg | ORAL_TABLET | Freq: Every day | ORAL | 3 refills | Status: AC

## 2024-06-13 MED ORDER — CYANOCOBALAMIN 1000 MCG/ML IJ SOLN
1000.0000 ug | Freq: Once | INTRAMUSCULAR | Status: AC
  Administered 2024-06-13: 1000 ug via INTRAMUSCULAR
  Filled 2024-06-13: qty 1

## 2024-06-13 NOTE — Progress Notes (Signed)
 [facility] Telephone:([phone]) [phone]   [phone]  OFFICE PROGRESS NOTE  Rodney Aland, MD [address], [address] [place] [address]  DIAGNOSIS: Stage IIIC/IV (T3, N3, M0) non-small cell lung cancer, adenocarcinoma. He presented with a left upper lobe spiculated mass in addition to lingular lesion and suspicious mediastinal lymphadenopathy He also has bilateral hypermetabolic axillary lymph nodes which could be related to metastatic disease versus inflammatory as the patient did have vaccines in both of his arms the week prior to his PET scan.  This was diagnosed in August 2024.   Molecular Studies: His PD-L1 expression was 1% and he has no actionable mutations.   PRIOR THERAPY:  1) Concurrent chemoradiation with carboplatin  for an AUC of 2 and paclitaxel  45 mg/m.  First dose expected on 08/15/23.  Status post 5 cycles of first cycle was given with carboplatin  and paclitaxel  and starting from cycle #2 he was on carboplatin  and Abraxane  secondary to hypersensitivity reaction to paclitaxel . 2) Consolidation immunotherapy with Imfinzi  1500 Mg IV every 4 weeks.  First dose November 28, 2023.  Status post 2 cycles.  Discontinued secondary to intolerance and patient is requested.   CURRENT THERAPY: First-line systemic chemotherapy with carboplatin  for AUC of 4, Alimta 400 Mg/M2 and Keytruda 200 Mg IV every 3 weeks.  First dose June 20, 2024.  INTERVAL HISTORY: [name] 83 y.o. male returns to the clinic today for follow-up visit.  Discussed the use of AI scribe software for clinical note transcription with the patient, who gave verbal consent to proceed.  History of Present Illness [name] is an 83 year old male with stage four lung adenocarcinoma who presents for evaluation and discussion of recent CT scan results and treatment options.  He has a history of stage four non-small cell lung cancer, adenocarcinoma, diagnosed in August 2024, with no actionable  mutations and a PD-L1 expression of one percent. He previously underwent concurrent chemoradiation with weekly carboplatin  and paclitaxel , followed by two cycles of consolidation treatment with Imfinzi , which was discontinued due to intolerance. Since then, he has been under observation.  He experiences significant chest pain, particularly on the left side, described as 'giving me a fit.' He recently consulted a cardiologist who prescribed patches for his shoulders to alleviate the pain. No increased shortness of breath, but he acknowledges occasional difficulty breathing. No cough, hemoptysis, or significant weight loss, although there is a slight decrease in weight from 155 pounds last month to 153 pounds currently.  A recent CT scan of the chest showed an increase in the size of the mass in the left lung's lingula from 3.6 x 1.6 cm to 5.5 x 3.8 cm. Additionally, there is fluid accumulation around the left lung.  He has a history of adverse reactions to steroids, which previously resulted in hyperglycemia with blood sugar levels reaching 700 mg/dL. He prefers to avoid steroids in future treatments.     MEDICAL HISTORY: Past Medical History:  Diagnosis Date   AICD (automatic cardioverter/defibrillator) present    CAD (coronary artery disease) 80% stenosis diag of the LAD, 30% in OM2 branch of LCX in 2009    a. Nonobstructive CAD by cath 11/2011 with the exception of the pre-existing diagonal branch #2 lesion.   Chronic systolic CHF (congestive heart failure) (HCC)    CKD (chronic kidney disease) stage 3, GFR 30-59 ml/min (HCC)    Colon polyp, hyperplastic    History of kidney stones  History of radiation therapy    Left Lung- 08/17/23-09/29/23- Dr. Lynwood Nasuti   History of stress test 06/01/2012   Normal myocardial perfusion study. compared to the previous study there is no significant change. this is a low risk scan   Hypertension    Legally blind    both eyes   Myocardial  infarction [facility]) 11/22/2011   NICM (nonischemic cardiomyopathy) (HCC)    a. Remote hx of dilated NICM with EF ranging 20-45%, including normal EF by echo (55-60%) in 2014.   Peripheral arterial disease (HCC)    a. 06/2014: ABI right 0.99, left 1.2, LE dopplers revealing an occluded right posterior tibial. As symptoms were not felt r/t claudication, no further w/u at the time.   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 Requiring discontinuation of BB dose.   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  MEDICATIONS:  Current Outpatient Medications  Medication Sig Dispense Refill   apixaban  (ELIQUIS ) 2.5 MG TABS tablet Take 1 tablet (2.5 mg total) by mouth 2 (two) times daily. 180 tablet 1   BREO ELLIPTA  100-25 MCG/INH AEPB Inhale 1 puff into the lungs every morning.     brimonidine  (ALPHAGAN ) 0.2 % ophthalmic solution Place 1 drop into both eyes 2 (two) times daily.     cycloSPORINE  (RESTASIS ) 0.05 % ophthalmic emulsion Place 1 drop into both eyes 2 (two) times daily.     dorzolamide -timolol  (COSOPT ) 22.3-6.8 MG/ML ophthalmic solution Place 1 drop into both eyes 2 (two) times daily.     ENTRESTO  24-26 MG TAKE 1 TABLET BY MOUTH TWICE DAILY 180 tablet 3   furosemide  (LASIX ) 20 MG tablet Take 0.5 tablets (10 mg total) by mouth daily. 30 tablet 1   Glucagon , rDNA, (GLUCAGON  EMERGENCY) 1 MG KIT Inject 1 mg into the skin as needed for up to 2 doses (Severe low blood sugar). 1 kit 0   hydrOXYzine  (ATARAX ) 10 MG tablet Take 1 tablet (10 mg total) by mouth at bedtime. 30 tablet 0   insulin  isophane & regular human KwikPen (HUMULIN 70/30 MIX) (70-30) 100 UNIT/ML KwikPen Inject 25 Units into the skin in the morning and at bedtime. 15 mL 0   isosorbide  mononitrate (IMDUR ) 30 MG 24 hr  tablet Take 30 mg by mouth daily.     lidocaine  (LIDODERM ) 5 % Place 1 patch onto the skin daily. Remove & Discard patch within 12 hours or as directed by MD 30 patch 0   metoprolol  succinate (TOPROL -XL) 25 MG 24 hr tablet Take 0.5 tablets (12.5 mg total) by mouth daily. 15 tablet 1   ROCKLATAN  0.02-0.005 % SOLN Place 1 drop into both eyes at bedtime.     spironolactone  (ALDACTONE ) 25 MG tablet Take 12.5 mg by mouth daily.     No current facility-administered medications for this visit.    SURGICAL HISTORY:  Past Surgical History:  Procedure Laterality Date   BACK SURGERY     BIV UPGRADE N/A 11/08/2019   Procedure: BIV UPGRADE;  Surgeon: Waddell Danelle ORN, MD;  Location: [facility] INVASIVE CV LAB;  Service: Cardiovascular;  Laterality: N/A;   BRONCHIAL BIOPSY  07/05/2023   Procedure: BRONCHIAL BIOPSIES;  Surgeon: Shelah Lamar RAMAN, MD;  Location: [facility] ENDOSCOPY;  Service: Pulmonary;;   BRONCHIAL BRUSHINGS  07/05/2023   Procedure: BRONCHIAL  BRUSHINGS;  Surgeon: Shelah Lamar RAMAN, MD;  Location: [facility] ENDOSCOPY;  Service: Pulmonary;;   BRONCHIAL NEEDLE ASPIRATION BIOPSY  07/05/2023   Procedure: BRONCHIAL NEEDLE ASPIRATION BIOPSIES;  Surgeon: Shelah Lamar RAMAN, MD;  Location[facility] ENDOSCOPY;  Service: Pulmonary;;   BRONCHIAL WASHINGS  07/05/2023   Procedure: BRONCHIAL WASHINGS;  Surgeon: Shelah Lamar RAMAN, MD;  Location: [facility] ENDOSCOPY;  Service: Pulmonary;;   CARDIAC CATHETERIZATION  11/2011   CARDIAC CATHETERIZATION  11/2011   didn't demonstrate high grade obstructive disease to account for his LV dysfunction.   CATARACT EXTRACTION, BILATERAL  1990's   CYSTOSCOPY     CYSTOSCOPY WITH BIOPSY Bilateral 08/03/2021   Procedure: CYSTOSCOPY WITH BLADDER BIOPSY, BILATERAL RETROGRADE PYELOGRAM;  Surgeon: Carolee Sherwood JONETTA DOUGLAS, MD;  Location: [facility] ORS;  Service: Urology;  Laterality: Bilateral;  REQUESTING 45 MINS   CYSTOSCOPY WITH URETHRAL DILATATION N/A 05/04/2013   Procedure: CYSTOSCOPY WITH URETHRAL DILATATION;  Surgeon: Alm RAMAN Molt, MD;  Location: [facility] NEURO ORS;  Service: Neurosurgery;  Laterality: N/A;  with insertion of foley catheter   EP study and ablation of VT  7/13   PVC focus mapped to the right coronary cusp of the aorta, limited ablation performed due to proximity of the focus to the right coronary artery   EYE SURGERY     FINE NEEDLE ASPIRATION  07/05/2023   Procedure: FINE NEEDLE ASPIRATION (FNA) LINEAR;  Surgeon: Shelah Lamar RAMAN, MD;  Location: [facility] ENDOSCOPY;  Service: Pulmonary;;   LEFT HEART CATH AND CORONARY ANGIOGRAPHY N/A 11/07/2019   Procedure: LEFT HEART CATH AND CORONARY ANGIOGRAPHY;  Surgeon: Darron Deatrice LABOR, MD;  Location: [facility] CV LAB;  Service: Cardiovascular;  Laterality: N/A;   LEFT HEART CATHETERIZATION WITH CORONARY ANGIOGRAM N/A 11/25/2011   Procedure: LEFT HEART CATHETERIZATION WITH CORONARY ANGIOGRAM;  Surgeon: Alm LELON Clay, MD;  Location: [facility] CATH LAB;  Service: Cardiovascular;  Laterality: N/A;   PACEMAKER IMPLANT N/A 07/12/2018   Procedure: PACEMAKER IMPLANT;  Surgeon: Francyne Headland, MD;  Location: [facility]VAS[facility] CV LAB;  Service: Cardiovascular;  Laterality: N/A;   POSTERIOR FUSION LUMBAR SPINE  1979   ROTATOR CUFF REPAIR  2000's   left   V-TACH ABLATION N/A 06/06/2012   Procedure: V-TACH ABLATION;  Surgeon: Lynwood Rakers, MD;  Location: [facility] CATH LAB;  Service: Cardiovascular;  Laterality: N/A;   VIDEO BRONCHOSCOPY WITH ENDOBRONCHIAL ULTRASOUND N/A 07/05/2023   Procedure: VIDEO BRONCHOSCOPY WITH ENDOBRONCHIAL ULTRASOUND;  Surgeon: Shelah Lamar RAMAN, MD;  Location: [facility] ENDOSCOPY;  Service: Pulmonary;  Laterality: N/A;    REVIEW OF SYSTEMS:  Constitutional: positive for fatigue Eyes: negative Ears, nose, mouth, throat, and face: negative Respiratory: positive for cough, dyspnea on exertion, and pleurisy/chest pain Cardiovascular: negative Gastrointestinal: negative Genitourinary:negative Integument/breast: negative Hematologic/lymphatic:  negative Musculoskeletal:negative Neurological: negative Behavioral/Psych: negative Endocrine: negative Allergic/Immunologic: negative   PHYSICAL EXAMINATION: General appearance: alert, cooperative, fatigued, and no distress Head: Normocephalic, without obvious abnormality, atraumatic Neck: no adenopathy, no JVD, supple, symmetrical, trachea midline, and thyroid  not enlarged, symmetric, no tenderness/mass/nodules Lymph nodes: Cervical, supraclavicular, and axillary nodes normal. Resp: clear to auscultation bilaterally Back: symmetric, no curvature. ROM normal. No CVA tenderness. Cardio: regular rate and rhythm, S1, S2 normal, no murmur, click, rub or gallop GI: soft, non-tender; bowel sounds normal; no masses,  no organomegaly Extremities: extremities normal, atraumatic, no cyanosis or edema Neurologic: Alert and oriented X 3, normal strength and tone. Normal symmetric reflexes. Normal coordination and gait  ECOG PERFORMANCE STATUS: 1 - Symptomatic but completely ambulatory  Blood pressure 136/77,  pulse 72, temperature (!) 97.3 F (36.3 C), temperature source Temporal, resp. rate 17, height 5' 7 (1.702 m), weight 153 lb (69.4 kg), SpO2 100%.  LABORATORY DATA: Lab Results  Component Value Date   WBC 4.1 06/10/2024   HGB 11.4 (L) 06/10/2024   HCT 34.2 (L) 06/10/2024   MCV 89.3 06/10/2024   PLT 148 (L) 06/10/2024      Chemistry      Component Value Date/Time   NA 136 06/10/2024 0345   NA 142 11/11/2022 1000   K 3.8 06/10/2024 0345   CL 105 06/10/2024 0345   CO2 21 (L) 06/10/2024 0345   BUN 15 06/10/2024 0345   BUN 18 11/11/2022 1000   CREATININE 1.26 (H) 06/10/2024 0345   CREATININE 1.58 (H) 05/17/2024 0739   CREATININE 1.37 (H) 01/24/2017 0950      Component Value Date/Time   CALCIUM  9.2 06/10/2024 0345   ALKPHOS 55 06/09/2024 1105   AST 20 06/09/2024 1105   AST 13 (L) 05/17/2024 0739   ALT 10 06/09/2024 1105   ALT 8 05/17/2024 0739   BILITOT 0.7 06/09/2024 1105    BILITOT 0.8 05/17/2024 0739       RADIOGRAPHIC STUDIES: DG Chest Portable 1 View Result Date: 06/09/2024 CLINICAL DATA:  Chest pain.  History of non-small cell lung cancer. EXAM: PORTABLE CHEST 1 VIEW COMPARISON:  CT 05/31/2024 FINDINGS: Left chest wall ICD noted with leads in the right atrial appendage, coronary sinus and right ventricle. Heart size and mediastinal contours are stable. No significant pleural effusion. Opacity within the apical segment of the left upper lobe corresponding to treated lung cancer. Persistent masslike opacity within the lingula is unchanged from the CT dated 05/31/2024. Decreased aeration to the left lung base may reflect atelectasis or airspace disease. Mild blunting of the left costophrenic angle. Visualized osseous structures appear grossly intact. IMPRESSION: 1. Decreased aeration to the left lung base may reflect atelectasis or airspace disease. 2. Persistent mass-like opacity within the lingula is unchanged from the CT dated 05/31/2024. 3. Opacity within the apical segment of the left upper lobe corresponding to treated lung cancer. Electronically Signed   By: Waddell Calk M.D.   On: 06/09/2024 12:03   CT Chest Wo Contrast Result Date: 06/04/2024 CLINICAL DATA:  Non-small cell lung cancer restaging, chemotherapy and XRT complete * Tracking Code: BO * EXAM: CT CHEST WITHOUT CONTRAST TECHNIQUE: Multidetector CT imaging of the chest was performed following the standard protocol without IV contrast. RADIATION DOSE REDUCTION: This exam was performed according to the departmental dose-optimization program which includes automated exposure control, adjustment of the mA and/or kV according to patient size and/or use of iterative reconstruction technique. COMPARISON:  10/18/2023 FINDINGS: Cardiovascular: Aortic atherosclerosis. Left chest multi lead pacer defibrillator normal heart size. Three-vessel coronary artery calcifications. No pericardial effusion.  Mediastinum/Nodes: Interval increase in ill-defined infiltrative soft tissue in the left hilum and AP window (series 2, image 53, series 7, image 69). Discretely measurable lymph nodes are however similar, pretracheal node measuring 2.4 x 1.0 cm (series 2, image 63). Thyroid  gland, trachea, and esophagus demonstrate no significant findings. Lungs/Pleura: New small left pleural effusion and associated atelectasis or consolidation. No significant change in a treated nodule of the posterior left pulmonary apex, measuring 2.4 x 2.3 cm (series 6, image 32). Significant interval increase in size of a masslike consolidation in the lingula containing air bronchograms, measuring 5.5 x 3.8 cm, previously no greater than 3.6 x 1.6 cm (series 6, image 82). Background of  mild emphysema. Upper Abdomen: No acute abnormality.  Nonobstructive renal calculi. Musculoskeletal: No chest wall abnormality. No acute osseous findings. IMPRESSION: 1. Significant interval increase in size of a masslike consolidation in the lingula containing air bronchograms, measuring 5.5 x 3.8 cm, previously no greater than 3.6 x 1.6 cm. 2. New small left pleural effusion and associated atelectasis or consolidation. 3. Interval increase in ill-defined infiltrative soft tissue in the left hilum and AP window. Discretely measurable lymph nodes are however similar. 4. Constellation of findings is consistent with progression of lung malignancy. 5. No significant change in a treated nodule of the posterior left pulmonary apex, measuring 2.4 x 2.3 cm. 6. Emphysema. 7. Coronary artery disease. Aortic Atherosclerosis (ICD10-I70.0) and Emphysema (ICD10-J43.9). Electronically Signed   By: Marolyn JONETTA Jaksch M.D.   On: 06/04/2024 07:10    ASSESSMENT AND PLAN: This is a very pleasant 83 years old African-American male with Stage IIIC/IV (T3, N3, M0) non-small cell lung cancer, adenocarcinoma. He presented with a left upper lobe spiculated mass in addition to lingular  lesion and suspicious mediastinal lymphadenopathy. He started a course of concurrent chemoradiation initially with carboplatin  for AUC of 2 and paclitaxel  45 Mg/M2 but paclitaxel  was the changed to Abraxane  starting from cycle #2 secondary to hypersensitivity reaction.  The patient received 5 cycles of concurrent chemoradiation.  He tolerated this treatment fairly well except for fatigue. The patient is currently on consolidation treatment with immunotherapy with Imfinzi  1500 Mg IV every 4 weeks.  Status post 2 cycles.  He has been tolerating this treatment well except for the itching and rash The patient he in the lower abdomen and back.  He decided to stop his treatment at that time.  Last dose was given in February 2025.  He is currently on observation. The patient had repeat CT scan of the chest performed recently.  I personally and independently reviewed the scan and discussed the result with the patient today.  His scan showed evidence for disease progression in the left lung. I had a lengthy discussion with the patient today about his current condition and treatment options.  He was given the option of palliative care and hospice referral versus consideration of palliative systemic chemotherapy with carboplatin  for AUC of 4, Alimta 400 Mg/M2 and Keytruda 200 Mg IV every 3 weeks. Assessment and Plan Assessment & Plan Stage 4 non-small cell lung cancer, adenocarcinoma subtype Stage 4 non-small cell lung cancer, adenocarcinoma subtype, with no actionable mutations and PD-L1 expression of 1%. Diagnosed in August 2024. Recent CT scan shows progression with increase in size of the mass in the left lung (lingula) from 3.6 x 1.6 cm to 5.5 x 3.8 cm, indicating disease progression. Initially declined further treatment but has now agreed to proceed due to disease progression. Treatment is not curative but may prolong life and improve quality of life. Risks include nausea, vomiting, neuropathy, and hair loss. -  Initiate chemotherapy with carboplatin  and Alimta at a reduced dose to assess tolerance. - Administer immunotherapy with Keytruda. - Schedule treatment every three weeks.   Pleural effusion due to lung cancer Pleural effusion present around the left lung, likely contributing to chest pain. Associated with progression of lung cancer. - Manage symptoms as part of overall cancer treatment plan.  Steroid-induced hyperglycemia Steroid-induced hyperglycemia with previous blood sugar elevation to 700 mg/dL. Prefers to avoid steroids due to this adverse effect. - Avoid use of steroids in treatment plan. The patient was advised to call immediately if she has any concerning  symptoms in the interval.  The patient voices understanding of current disease status and treatment options and is in agreement with the current care plan.  All questions were answered. The patient knows to call the clinic with any problems, questions or concerns. We can certainly see the patient much sooner if necessary.  The total time spent in the appointment was 55 minutes.  Disclaimer: This note was dictated with voice recognition software. Similar sounding words can inadvertently be transcribed and may not be corrected upon review.

## 2024-06-13 NOTE — Progress Notes (Signed)
 DISCONTINUE ON PATHWAY REGIMEN - Non-Small Cell Lung     A cycle is every 28 days:     Durvalumab    **Always confirm dose/schedule in your pharmacy ordering system**  PRIOR TREATMENT: [id number]: Durvalumab  1,500 mg q28 Days x up to 12 Months  START ON PATHWAY REGIMEN - Non-Small Cell Lung     A cycle is every 21 days:     Pembrolizumab      Pemetrexed      Carboplatin    **Always confirm dose/schedule in your pharmacy ordering system**  Patient Characteristics: Stage IV Metastatic, Nonsquamous, Molecular Analysis Completed, Molecular Alteration Present and Targeted Therapy Exhausted OR KRAS G12C+ or HER2+ or NRG1+ or c-Met Present and No Prior Chemo/Immunotherapy OR No Alteration Present, Initial  Chemotherapy/Immunotherapy, PS = 0, 1, No Alteration Present, No Alteration Present, Candidate for Immunotherapy, PD-L1 Expression Positive 1-49% (TPS) / Negative / Not Tested / Awaiting Test Results and Immunotherapy Candidate Therapeutic Status: Stage IV Metastatic Histology: Nonsquamous Cell Broad Molecular Profiling Status: Molecular Analysis Completed Molecular Analysis Results: No Alteration Present ECOG Performance Status: 1 Chemotherapy/Immunotherapy Line of Therapy: Initial Chemotherapy/Immunotherapy EGFR Exons 18-21 Mutation Testing Status: Completed and Negative c-Met Overexpression (EGFR Wildtype) Testing Status: Completed and Negative ALK Fusion/Rearrangement Testing Status: Completed and Negative BRAF V600 Mutation Testing Status: Completed and Negative KRAS G12C Mutation Testing Status: Completed and Negative MET Exon 14 Mutation Testing Status: Completed and Negative RET Fusion/Rearrangement Testing Status: Completed and Negative NRG1 Fusion/Rearrangement Testing Status: Completed and Negative HER2 Mutation Testing Status: Completed and Negative NTRK Fusion/Rearrangement Testing Status: Completed and Negative ROS1 Fusion/Rearrangement Testing Status: Completed and  Negative Immunotherapy Candidate Status: Candidate for Immunotherapy PD-L1 Expression Status: PD-L1 Positive 1-49% (TPS) Intent of Therapy: Non-Curative / Palliative Intent, Discussed with Patient

## 2024-06-14 ENCOUNTER — Other Ambulatory Visit: Payer: Self-pay

## 2024-06-14 ENCOUNTER — Ambulatory Visit: Admitting: Dietician

## 2024-06-15 ENCOUNTER — Encounter: Payer: Self-pay | Admitting: Dietician

## 2024-06-15 ENCOUNTER — Encounter: Attending: Internal Medicine | Admitting: Dietician

## 2024-06-15 DIAGNOSIS — E118 Type 2 diabetes mellitus with unspecified complications: Secondary | ICD-10-CM | POA: Insufficient documentation

## 2024-06-15 NOTE — Patient Instructions (Signed)
 Plan:  Aim for 4 Carb Choices per meal (60 grams) +/- 1 either way  Aim for 0-1 Carbs per snack if hungry  Include protein in moderation with your meals and snacks Consider reading food labels for Total Carbohydrate of foods Consider  increasing your activity level by using the stationary bike or walking for 20-30 minutes daily as tolerated Continue checking BG at alternate times per day  Continue taking medication as directed by MD   Continue Glucerna daily -  coupons provided.  Alternately he could drink Boost Glucose Control.

## 2024-06-15 NOTE — Progress Notes (Signed)
 Medical Nutrition Therapy  Appointment Start time:  76  Appointment End time:  1115 Patient is here today with his wife.  She has had recent surgery but is doing well but is very underweight and states that she has had all of her teeth pulled.  Patient states that he is feeling dizzy today.  Blood pressure in office:  142/68.  Sensor reading is currently 101 in office now -  after breakfast.  He has had 5 low sensor glucose readings (~68) in the past week.  Primary concerns today:  He would like to learn how to keep control of his blood glucose.  He has not been giving his own insulin  injections.  A neighbor and another relative have been giving the shots currently but usually his wife gives the shots.  He can't seen the numbers on the insulin  pen. 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 Notable Signs/Symptoms: walks with a cane - new CGM:  Libre 3 - Sensor reading is now 150 in the office. Much improved from >250 on 02/29/2024. App was put on Niece's phone as patient's phone doesn't support the app.  She did not realize until recently and they have left a message with the MD to obtain a receiver for patient.  CGM Results from download: 03/05/2024 06/15/24  % Time CGM active:  34%   (Goal >70%) 91%  Average glucose:   142 mg/dL for 14 days 888 x 14  Glucose management indicator:    %   Time in range (70-180 mg/dL):   82 %   (Goal >29%) 100%  Time High (181-250 mg/dL):   16 %   (Goal < 74%) 0  Time Very High (>250 mg/dL):     2 %   (Goal < 5%) 1  Time Low (54-69 mg/dL):   0 %   (Goal <5%) 0  Time Very Low (<54 mg/dL):   0 %   (Goal <8%) 0  %CV (glucose variability)     %  (Goal <36%)    Lifestyle & Dietary Hx Patient lives with his wife.  Patient's wife does the cooking. He gets rides for transportation. He is retired from tree work. He has a lot of support with family and friends  Estimated daily fluid intake: States that he drinks a lot of water  but doesn't know how much Supplements: liquid MVI Sleep: good Stress / self-care: low stress Current average weekly physical activity: exercise bide 20 minutes daily prior to hospitalization.  He walks the dogs daily.    24-Hr Dietary Recall First Meal: sausage, egg sandwich on [employer] bread, coffee with sweet and low and cream Snack: none Second Meal:  banana Snack: rizt crackers or graham crackers Third Meal: beef hot dogs (2) on buns with chili, slaw, onions Snack: Glucerna Beverages: water , Glucerna, coffee with sweet and low and cream, sugar free punch, sugar free tea  Estimated Energy Needs Calories: 2000-2200 Protein: 70g  NUTRITION DIAGNOSIS  NB-1.1 Food and nutrition-related knowledge deficit As related to balance of carbohydrates, protein, and fat .  As evidenced by patient  report and diet hx.   NUTRITION INTERVENTION  Nutrition education (E-1) on the following topics:  Nutritional intake and weight Review of supplement options - coupons provided Nutritional intake to maintain blood glucose control Evaluation of CGM and how to interpret with intake Activity level and blood glucose Low blood glucose treatment.  Verify with finger stick.    Handouts Provided Include - initial visit How to Thrive:  A Guide for Your Journey with Diabetes by the ADA Meal Plan Card My Plate Snack list Label reading Diabetes Resources   Learning Style & Readiness for Change Teaching method utilized: Visual & Auditory  Demonstrated degree of understanding via:  Teach Back  Barriers to learning/adherence to lifestyle change: vision, HOH, fall risk  Plan:  Aim for 4 Carb Choices per meal (60 grams) +/- 1 either way  Aim for 0-1 Carbs per snack if hungry  Include protein in moderation with your meals and snacks Consider reading food labels for Total Carbohydrate of foods Consider  increasing your activity level by using the stationary bike or walking for 20-30 minutes daily as tolerated Continue checking BG at alternate times per day  Continue taking medication as directed by MD  Consider Glucerna daily - samples provided along with coupons.  Alternately he could drink Boost Glucose Control.  MONITORING & EVALUATION Dietary intake, weekly physical activity, and label reading in 3 months.  Next Steps  Patient is to call for questions.

## 2024-06-19 ENCOUNTER — Telehealth: Payer: Self-pay

## 2024-06-19 ENCOUNTER — Telehealth: Payer: Self-pay | Admitting: Internal Medicine

## 2024-06-19 NOTE — Telephone Encounter (Signed)
 Spoke with patient's wife regarding questions about the medications Zofran  and Folic Acid . Informed her that the patient may take Zofran  as needed and to start Folic Acid  today. She voiced understanding.

## 2024-06-19 NOTE — Telephone Encounter (Signed)
 Scheduled appointments with the patient and confirmed details.

## 2024-06-22 MED FILL — Fosaprepitant Dimeglumine For IV Infusion 150 MG (Base Eq): INTRAVENOUS | Qty: 5 | Status: AC

## 2024-06-25 ENCOUNTER — Inpatient Hospital Stay: Attending: Internal Medicine

## 2024-06-25 ENCOUNTER — Ambulatory Visit: Payer: Self-pay | Admitting: Cardiovascular Disease

## 2024-06-25 ENCOUNTER — Inpatient Hospital Stay

## 2024-06-25 VITALS — BP 143/79 | HR 70 | Temp 98.2°F | Resp 18 | Ht 67.0 in | Wt 152.8 lb

## 2024-06-25 DIAGNOSIS — C3412 Malignant neoplasm of upper lobe, left bronchus or lung: Secondary | ICD-10-CM

## 2024-06-25 DIAGNOSIS — N281 Cyst of kidney, acquired: Secondary | ICD-10-CM | POA: Diagnosis not present

## 2024-06-25 DIAGNOSIS — Z9226 Personal history of immune checkpoint inhibitor therapy: Secondary | ICD-10-CM | POA: Insufficient documentation

## 2024-06-25 DIAGNOSIS — I5022 Chronic systolic (congestive) heart failure: Secondary | ICD-10-CM | POA: Insufficient documentation

## 2024-06-25 DIAGNOSIS — I700 Atherosclerosis of aorta: Secondary | ICD-10-CM | POA: Insufficient documentation

## 2024-06-25 DIAGNOSIS — Z5111 Encounter for antineoplastic chemotherapy: Secondary | ICD-10-CM | POA: Insufficient documentation

## 2024-06-25 DIAGNOSIS — D701 Agranulocytosis secondary to cancer chemotherapy: Secondary | ICD-10-CM | POA: Diagnosis not present

## 2024-06-25 DIAGNOSIS — N200 Calculus of kidney: Secondary | ICD-10-CM | POA: Diagnosis not present

## 2024-06-25 DIAGNOSIS — I739 Peripheral vascular disease, unspecified: Secondary | ICD-10-CM | POA: Diagnosis not present

## 2024-06-25 DIAGNOSIS — L299 Pruritus, unspecified: Secondary | ICD-10-CM | POA: Insufficient documentation

## 2024-06-25 DIAGNOSIS — Z5112 Encounter for antineoplastic immunotherapy: Secondary | ICD-10-CM | POA: Insufficient documentation

## 2024-06-25 DIAGNOSIS — Z79899 Other long term (current) drug therapy: Secondary | ICD-10-CM | POA: Diagnosis not present

## 2024-06-25 DIAGNOSIS — T451X5A Adverse effect of antineoplastic and immunosuppressive drugs, initial encounter: Secondary | ICD-10-CM | POA: Insufficient documentation

## 2024-06-25 DIAGNOSIS — Z8701 Personal history of pneumonia (recurrent): Secondary | ICD-10-CM | POA: Insufficient documentation

## 2024-06-25 DIAGNOSIS — I252 Old myocardial infarction: Secondary | ICD-10-CM | POA: Insufficient documentation

## 2024-06-25 DIAGNOSIS — Z87442 Personal history of urinary calculi: Secondary | ICD-10-CM | POA: Insufficient documentation

## 2024-06-25 DIAGNOSIS — M8580 Other specified disorders of bone density and structure, unspecified site: Secondary | ICD-10-CM | POA: Insufficient documentation

## 2024-06-25 DIAGNOSIS — I708 Atherosclerosis of other arteries: Secondary | ICD-10-CM | POA: Insufficient documentation

## 2024-06-25 DIAGNOSIS — I2510 Atherosclerotic heart disease of native coronary artery without angina pectoris: Secondary | ICD-10-CM | POA: Insufficient documentation

## 2024-06-25 DIAGNOSIS — Z79620 Long term (current) use of immunosuppressive biologic: Secondary | ICD-10-CM | POA: Insufficient documentation

## 2024-06-25 DIAGNOSIS — I130 Hypertensive heart and chronic kidney disease with heart failure and stage 1 through stage 4 chronic kidney disease, or unspecified chronic kidney disease: Secondary | ICD-10-CM | POA: Diagnosis not present

## 2024-06-25 DIAGNOSIS — Z79630 Long term (current) use of alkylating agent: Secondary | ICD-10-CM | POA: Diagnosis not present

## 2024-06-25 DIAGNOSIS — N1832 Chronic kidney disease, stage 3b: Secondary | ICD-10-CM | POA: Insufficient documentation

## 2024-06-25 DIAGNOSIS — J432 Centrilobular emphysema: Secondary | ICD-10-CM | POA: Insufficient documentation

## 2024-06-25 DIAGNOSIS — Z79631 Long term (current) use of antimetabolite agent: Secondary | ICD-10-CM | POA: Diagnosis not present

## 2024-06-25 DIAGNOSIS — Z860102 Personal history of hyperplastic colon polyps: Secondary | ICD-10-CM | POA: Insufficient documentation

## 2024-06-25 DIAGNOSIS — Z7901 Long term (current) use of anticoagulants: Secondary | ICD-10-CM | POA: Insufficient documentation

## 2024-06-25 DIAGNOSIS — Z923 Personal history of irradiation: Secondary | ICD-10-CM | POA: Insufficient documentation

## 2024-06-25 DIAGNOSIS — R590 Localized enlarged lymph nodes: Secondary | ICD-10-CM | POA: Insufficient documentation

## 2024-06-25 LAB — CBC WITH DIFFERENTIAL (CANCER CENTER ONLY)
Abs Immature Granulocytes: 0.02 K/uL (ref 0.00–0.07)
Basophils Absolute: 0 K/uL (ref 0.0–0.1)
Basophils Relative: 0 %
Eosinophils Absolute: 0.1 K/uL (ref 0.0–0.5)
Eosinophils Relative: 2 %
HCT: 34.8 % — ABNORMAL LOW (ref 39.0–52.0)
Hemoglobin: 11.4 g/dL — ABNORMAL LOW (ref 13.0–17.0)
Immature Granulocytes: 0 %
Lymphocytes Relative: 22 %
Lymphs Abs: 1 K/uL (ref 0.7–4.0)
MCH: 29.3 pg (ref 26.0–34.0)
MCHC: 32.8 g/dL (ref 30.0–36.0)
MCV: 89.5 fL (ref 80.0–100.0)
Monocytes Absolute: 0.4 K/uL (ref 0.1–1.0)
Monocytes Relative: 9 %
Neutro Abs: 3 K/uL (ref 1.7–7.7)
Neutrophils Relative %: 67 %
Platelet Count: 149 K/uL — ABNORMAL LOW (ref 150–400)
RBC: 3.89 MIL/uL — ABNORMAL LOW (ref 4.22–5.81)
RDW: 12.4 % (ref 11.5–15.5)
WBC Count: 4.6 K/uL (ref 4.0–10.5)
nRBC: 0 % (ref 0.0–0.2)

## 2024-06-25 LAB — CMP (CANCER CENTER ONLY)
ALT: 8 U/L (ref 0–44)
AST: 13 U/L — ABNORMAL LOW (ref 15–41)
Albumin: 3.8 g/dL (ref 3.5–5.0)
Alkaline Phosphatase: 57 U/L (ref 38–126)
Anion gap: 5 (ref 5–15)
BUN: 20 mg/dL (ref 8–23)
CO2: 27 mmol/L (ref 22–32)
Calcium: 9.6 mg/dL (ref 8.9–10.3)
Chloride: 107 mmol/L (ref 98–111)
Creatinine: 1.28 mg/dL — ABNORMAL HIGH (ref 0.61–1.24)
GFR, Estimated: 55 mL/min — ABNORMAL LOW (ref >60)
Glucose, Bld: 110 mg/dL — ABNORMAL HIGH (ref 70–99)
Potassium: 4.4 mmol/L (ref 3.5–5.1)
Sodium: 139 mmol/L (ref 135–145)
Total Bilirubin: 0.6 mg/dL (ref 0.0–1.2)
Total Protein: 7.7 g/dL (ref 6.5–8.1)

## 2024-06-25 LAB — TSH: TSH: 1.46 u[IU]/mL (ref 0.350–4.500)

## 2024-06-25 MED ORDER — SODIUM CHLORIDE 0.9% FLUSH: 10.0000 mL | INTRAVENOUS | Status: DC | PRN

## 2024-06-25 MED ORDER — SODIUM CHLORIDE 0.9 % IV SOLN
268.4000 mg | Freq: Once | INTRAVENOUS | Status: AC
  Administered 2024-06-25: 270 mg via INTRAVENOUS
  Filled 2024-06-25: qty 27

## 2024-06-25 MED ORDER — DEXAMETHASONE SODIUM PHOSPHATE 10 MG/ML IJ SOLN
10.0000 mg | Freq: Once | INTRAMUSCULAR | Status: AC
  Administered 2024-06-25: 10 mg via INTRAVENOUS
  Filled 2024-06-25: qty 1

## 2024-06-25 MED ORDER — SODIUM CHLORIDE 0.9 % IV SOLN
150.0000 mg | Freq: Once | INTRAVENOUS | Status: AC
  Administered 2024-06-25: 150 mg via INTRAVENOUS
  Filled 2024-06-25: qty 150

## 2024-06-25 MED ORDER — PALONOSETRON HCL INJECTION 0.25 MG/5ML
0.2500 mg | Freq: Once | INTRAVENOUS | Status: AC
  Administered 2024-06-25: 0.25 mg via INTRAVENOUS
  Filled 2024-06-25: qty 5

## 2024-06-25 MED ORDER — SODIUM CHLORIDE 0.9 % IV SOLN
INTRAVENOUS | Status: DC
Start: 2024-06-25 — End: 2024-06-25

## 2024-06-25 MED ORDER — SODIUM CHLORIDE 0.9 % IV SOLN
200.0000 mg | Freq: Once | INTRAVENOUS | Status: AC
  Administered 2024-06-25: 200 mg via INTRAVENOUS
  Filled 2024-06-25: qty 200

## 2024-06-25 MED ORDER — SODIUM CHLORIDE 0.9 % IV SOLN
400.0000 mg/m2 | Freq: Once | INTRAVENOUS | Status: AC
  Administered 2024-06-25: 700 mg via INTRAVENOUS
  Filled 2024-06-25: qty 20

## 2024-06-25 NOTE — Patient Instructions (Signed)
 [facility] - A DEPT OF [facility]  Discharge Instructions: Thank you for choosing [facility] to provide your oncology and hematology care.   If you have a lab appointment with the Cancer Center, please go directly to the Cancer Center and check in at the registration area.   Wear comfortable clothing and clothing appropriate for easy access to any Portacath or PICC line.   We strive to give you quality time with your provider. You may need to reschedule your appointment if you arrive late (15 or more minutes).  Arriving late affects you and other patients whose appointments are after yours.  Also, if you miss three or more appointments without notifying the office, you may be dismissed from the clinic at the provider's discretion.      For prescription refill requests, have your pharmacy contact our office and allow 72 hours for refills to be completed.    Today you received the following chemotherapy and/or immunotherapy agents: Keytruda , Alimta       To help prevent nausea and vomiting after your treatment, we encourage you to take your nausea medication as directed.  BELOW ARE SYMPTOMS THAT SHOULD BE REPORTED IMMEDIATELY: *FEVER GREATER THAN 100.4 F (38 C) OR HIGHER *CHILLS OR SWEATING *NAUSEA AND VOMITING THAT IS NOT CONTROLLED WITH YOUR NAUSEA MEDICATION *UNUSUAL SHORTNESS OF BREATH *UNUSUAL BRUISING OR BLEEDING *URINARY PROBLEMS (pain or burning when urinating, or frequent urination) *BOWEL PROBLEMS (unusual diarrhea, constipation, pain near the anus) TENDERNESS IN MOUTH AND THROAT WITH OR WITHOUT PRESENCE OF ULCERS (sore throat, sores in mouth, or a toothache) UNUSUAL RASH, SWELLING OR PAIN  UNUSUAL VAGINAL DISCHARGE OR ITCHING   Items with * indicate a potential emergency and should be followed up as soon as possible or go to the Emergency Department if any problems should occur.  Please show the CHEMOTHERAPY ALERT CARD or  IMMUNOTHERAPY ALERT CARD at check-in to the Emergency Department and triage nurse.  Should you have questions after your visit or need to cancel or reschedule your appointment, please contact [facility] - A DEPT OF [facility]  Dept: [phone]  and follow the prompts.  Office hours are 8:00 a.m. to 4:30 p.m. Monday - Friday. Please note that voicemails left after 4:00 p.m. may not be returned until the following business day.  We are closed weekends and major holidays. You have access to a nurse at all times for urgent questions. Please call the main number to the clinic Dept: [phone] and follow the prompts.   For any non-urgent questions, you may also contact your provider using MyChart. We now offer e-Visits for anyone 31 and older to request care online for non-urgent symptoms. For details visit [email or web address].   Also download the MyChart app! Go to the app store, search MyChart, open the app, select [place], and log in with your MyChart username and password.  Pembrolizumab  Injection What is this medication? PEMBROLIZUMAB  (PEM broe LIZ ue mab) treats some types of cancer. It works by helping your immune system slow or stop the spread of cancer cells. It is a monoclonal antibody. This medicine may be used for other purposes; ask your health care provider or pharmacist if you have questions. COMMON BRAND NAME(S): Keytruda  What should I tell my care team before I take this medication? They need to know if you have any of these conditions: Allogeneic stem cell transplant (uses someone else's stem  cells) Autoimmune diseases, such as Crohn disease, ulcerative colitis, lupus History of chest radiation Nervous system problems, such as Guillain-Barre syndrome, myasthenia gravis Organ transplant An unusual or allergic reaction to pembrolizumab , other medications, foods, dyes, or preservatives Pregnant or trying to get  pregnant Breast-feeding How should I use this medication? This medication is injected into a vein. It is given by your care team in a hospital or clinic setting. A special MedGuide will be given to you before each treatment. Be sure to read this information carefully each time. Talk to your care team about the use of this medication in children. While it may be prescribed for children as young as 6 months for selected conditions, precautions do apply. Overdosage: If you think you have taken too much of this medicine contact a poison control center or emergency room at once. NOTE: This medicine is only for you. Do not share this medicine with others. What if I miss a dose? Keep appointments for follow-up doses. It is important not to miss your dose. Call your care team if you are unable to keep an appointment. What may interact with this medication? Interactions have not been studied. This list may not describe all possible interactions. Give your health care provider a list of all the medicines, herbs, non-prescription drugs, or dietary supplements you use. Also tell them if you smoke, drink alcohol, or use illegal drugs. Some items may interact with your medicine. What should I watch for while using this medication? Your condition will be monitored carefully while you are receiving this medication. You may need blood work while taking this medication. This medication may cause serious skin reactions. They can happen weeks to months after starting the medication. Contact your care team right away if you notice fevers or flu-like symptoms with a rash. The rash may be red or purple and then turn into blisters or peeling of the skin. You may also notice a red rash with swelling of the face, lips, or lymph nodes in your neck or under your arms. Tell your care team right away if you have any change in your eyesight. Talk to your care team if you may be pregnant. Serious birth defects can occur if you  take this medication during pregnancy and for 4 months after the last dose. You will need a negative pregnancy test before starting this medication. Contraception is recommended while taking this medication and for 4 months after the last dose. Your care team can help you find the option that works for you. Do not breastfeed while taking this medication and for 4 months after the last dose. What side effects may I notice from receiving this medication? Side effects that you should report to your care team as soon as possible: Allergic reactions--skin rash, itching, hives, swelling of the face, lips, tongue, or throat Dry cough, shortness of breath or trouble breathing Eye pain, redness, irritation, or discharge with blurry or decreased vision Heart muscle inflammation--unusual weakness or fatigue, shortness of breath, chest pain, fast or irregular heartbeat, dizziness, swelling of the ankles, feet, or hands Hormone gland problems--headache, sensitivity to light, unusual weakness or fatigue, dizziness, fast or irregular heartbeat, increased sensitivity to cold or heat, excessive sweating, constipation, hair loss, increased thirst or amount of urine, tremors or shaking, irritability Infusion reactions--chest pain, shortness of breath or trouble breathing, feeling faint or lightheaded Kidney injury (glomerulonephritis)--decrease in the amount of urine, red or dark brown urine, foamy or bubbly urine, swelling of the ankles, hands, or  feet Liver injury--right upper belly pain, loss of appetite, nausea, light-colored stool, dark yellow or brown urine, yellowing skin or eyes, unusual weakness or fatigue Pain, tingling, or numbness in the hands or feet, muscle weakness, change in vision, confusion or trouble speaking, loss of balance or coordination, trouble walking, seizures Rash, fever, and swollen lymph nodes Redness, blistering, peeling, or loosening of the skin, including inside the mouth Sudden or  severe stomach pain, bloody diarrhea, fever, nausea, vomiting Side effects that usually do not require medical attention (report to your care team if they continue or are bothersome): Bone, joint, or muscle pain Diarrhea Fatigue Loss of appetite Nausea Skin rash This list may not describe all possible side effects. Call your doctor for medical advice about side effects. You may report side effects to FDA at 1-800-FDA-1088. Where should I keep my medication? This medication is given in a hospital or clinic. It will not be stored at home. NOTE: This sheet is a summary. It may not cover all possible information. If you have questions about this medicine, talk to your doctor, pharmacist, or health care provider.  2024 Elsevier/Gold Standard (2022-03-23 00:00:00)  Pemetrexed  Injection What is this medication? PEMETREXED  (PEM e TREX ed) treats some types of cancer. It works by slowing down the growth of cancer cells. This medicine may be used for other purposes; ask your health care provider or pharmacist if you have questions. COMMON BRAND NAME(S): Alimta , PEMFEXY, PEMRYDI RTU What should I tell my care team before I take this medication? They need to know if you have any of these conditions: Infection, such as chickenpox, cold sores, or herpes Kidney disease Low blood cell levels (white cells, red cells, and platelets) Lung or breathing disease, such as asthma Radiation therapy An unusual or allergic reaction to pemetrexed , other medications, foods, dyes, or preservatives If you or your partner are pregnant or trying to get pregnant Breast-feeding How should I use this medication? This medication is injected into a vein. It is given by your care team in a hospital or clinic setting. Talk to your care team about the use of this medication in children. Special care may be needed. Overdosage: If you think you have taken too much of this medicine contact a poison control center or emergency  room at once. NOTE: This medicine is only for you. Do not share this medicine with others. What if I miss a dose? Keep appointments for follow-up doses. It is important not to miss your dose. Call your care team if you are unable to keep an appointment. What may interact with this medication? Do not take this medication with any of the following: Live virus vaccines This medication may also interact with the following: Ibuprofen This list may not describe all possible interactions. Give your health care provider a list of all the medicines, herbs, non-prescription drugs, or dietary supplements you use. Also tell them if you smoke, drink alcohol, or use illegal drugs. Some items may interact with your medicine. What should I watch for while using this medication? Your condition will be monitored carefully while you are receiving this medication. This medication may make you feel generally unwell. This is not uncommon as chemotherapy can affect healthy cells as well as cancer cells. Report any side effects. Continue your course of treatment even though you feel ill unless your care team tells you to stop. This medication can cause serious side effects. To reduce the risk, your care team may give you other medications to  take before receiving this one. Be sure to follow the directions from your care team. This medication can cause a rash or redness in areas of the body that have previously had radiation therapy. If you have had radiation therapy, tell your care team if you notice a rash in this area. This medication may increase your risk of getting an infection. Call your care team for advice if you get a fever, chills, sore throat, or other symptoms of a cold or flu. Do not treat yourself. Try to avoid being around people who are sick. Be careful brushing or flossing your teeth or using a toothpick because you may get an infection or bleed more easily. If you have any dental work done, tell your  dentist you are receiving this medication. Avoid taking medications that contain aspirin , acetaminophen , ibuprofen, naproxen, or ketoprofen unless instructed by your care team. These medications may hide a fever. Check with your care team if you have severe diarrhea, nausea, and vomiting, or if you sweat a lot. The loss of too much body fluid may make it dangerous for you to take this medication. Talk to your care team if you or your partner wish to become pregnant or think either of you might be pregnant. This medication can cause serious birth defects if taken during pregnancy and for 6 months after the last dose. A negative pregnancy test is required before starting this medication. A reliable form of contraception is recommended while taking this medication and for 6 months after the last dose. Talk to your care team about reliable forms of contraception. Do not father a child while taking this medication and for 3 months after the last dose. Use a condom while having sex during this time period. Do not breastfeed while taking this medication and for 1 week after the last dose. This medication may cause infertility. Talk to your care team if you are concerned about your fertility. What side effects may I notice from receiving this medication? Side effects that you should report to your care team as soon as possible: Allergic reactions--skin rash, itching, hives, swelling of the face, lips, tongue, or throat Dry cough, shortness of breath or trouble breathing Infection--fever, chills, cough, sore throat, wounds that don't heal, pain or trouble when passing urine, general feeling of discomfort or being unwell Kidney injury--decrease in the amount of urine, swelling of the ankles, hands, or feet Low red blood cell level--unusual weakness or fatigue, dizziness, headache, trouble breathing Redness, blistering, peeling, or loosening of the skin, including inside the mouth Unusual bruising or  bleeding Side effects that usually do not require medical attention (report to your care team if they continue or are bothersome): Fatigue Loss of appetite Nausea Vomiting This list may not describe all possible side effects. Call your doctor for medical advice about side effects. You may report side effects to FDA at 1-800-FDA-1088. Where should I keep my medication? This medication is given in a hospital or clinic. It will not be stored at home. NOTE: This sheet is a summary. It may not cover all possible information. If you have questions about this medicine, talk to your doctor, pharmacist, or health care provider.  2024 Elsevier/Gold Standard (2022-03-16 00:00:00)

## 2024-06-26 LAB — T4: T4, Total: 6.6 ug/dL (ref 4.5–12.0)

## 2024-07-02 ENCOUNTER — Telehealth: Payer: Self-pay | Admitting: Medical Oncology

## 2024-07-02 ENCOUNTER — Inpatient Hospital Stay

## 2024-07-02 ENCOUNTER — Encounter: Payer: Self-pay | Admitting: Internal Medicine

## 2024-07-02 DIAGNOSIS — C3412 Malignant neoplasm of upper lobe, left bronchus or lung: Secondary | ICD-10-CM

## 2024-07-02 DIAGNOSIS — Z5112 Encounter for antineoplastic immunotherapy: Secondary | ICD-10-CM | POA: Diagnosis not present

## 2024-07-02 LAB — CBC WITH DIFFERENTIAL (CANCER CENTER ONLY)
Abs Immature Granulocytes: 0 K/uL (ref 0.00–0.07)
Basophils Absolute: 0 K/uL (ref 0.0–0.1)
Basophils Relative: 1 %
Eosinophils Absolute: 0.1 K/uL (ref 0.0–0.5)
Eosinophils Relative: 7 %
HCT: 34.9 % — ABNORMAL LOW (ref 39.0–52.0)
Hemoglobin: 11.6 g/dL — ABNORMAL LOW (ref 13.0–17.0)
Immature Granulocytes: 0 %
Lymphocytes Relative: 48 %
Lymphs Abs: 0.5 K/uL — ABNORMAL LOW (ref 0.7–4.0)
MCH: 29.4 pg (ref 26.0–34.0)
MCHC: 33.2 g/dL (ref 30.0–36.0)
MCV: 88.6 fL (ref 80.0–100.0)
Monocytes Absolute: 0.1 K/uL (ref 0.1–1.0)
Monocytes Relative: 6 %
Neutro Abs: 0.4 K/uL — CL (ref 1.7–7.7)
Neutrophils Relative %: 38 %
Platelet Count: 80 K/uL — ABNORMAL LOW (ref 150–400)
RBC: 3.94 MIL/uL — ABNORMAL LOW (ref 4.22–5.81)
RDW: 12.5 % (ref 11.5–15.5)
Smear Review: NORMAL
WBC Count: 1.1 K/uL — ABNORMAL LOW (ref 4.0–10.5)
nRBC: 0 % (ref 0.0–0.2)

## 2024-07-02 LAB — CMP (CANCER CENTER ONLY)
ALT: 10 U/L (ref 0–44)
AST: 14 U/L — ABNORMAL LOW (ref 15–41)
Albumin: 3.6 g/dL (ref 3.5–5.0)
Alkaline Phosphatase: 53 U/L (ref 38–126)
Anion gap: 7 (ref 5–15)
BUN: 26 mg/dL — ABNORMAL HIGH (ref 8–23)
CO2: 26 mmol/L (ref 22–32)
Calcium: 8.7 mg/dL — ABNORMAL LOW (ref 8.9–10.3)
Chloride: 104 mmol/L (ref 98–111)
Creatinine: 1.49 mg/dL — ABNORMAL HIGH (ref 0.61–1.24)
GFR, Estimated: 46 mL/min — ABNORMAL LOW (ref >60)
Glucose, Bld: 103 mg/dL — ABNORMAL HIGH (ref 70–99)
Potassium: 4 mmol/L (ref 3.5–5.1)
Sodium: 137 mmol/L (ref 135–145)
Total Bilirubin: 0.6 mg/dL (ref 0.0–1.2)
Total Protein: 7.4 g/dL (ref 6.5–8.1)

## 2024-07-02 NOTE — Telephone Encounter (Signed)
 LVM with pt appointment tomorrow.

## 2024-07-02 NOTE — Telephone Encounter (Signed)
 CRITICAL VALUE STICKER  CRITICAL VALUE: Anc=0.4  RECEIVER (on-site recipient of call):Selena Swaminathan, RN  DATE & TIME NOTIFIED: 07/02/2024 @ 1603  MESSENGER (representative from lab):[place]  MD NOTIFIED: Dr. Sherrod  TIME OF NOTIFICATION:1625  RESPONSE:  Dr. Sherrod is scheduled to see pt tomorrow.

## 2024-07-03 ENCOUNTER — Inpatient Hospital Stay

## 2024-07-03 ENCOUNTER — Inpatient Hospital Stay (HOSPITAL_BASED_OUTPATIENT_CLINIC_OR_DEPARTMENT_OTHER): Admitting: Internal Medicine

## 2024-07-03 VITALS — BP 133/70 | HR 73 | Temp 98.1°F | Resp 16 | Ht 67.0 in | Wt 151.0 lb

## 2024-07-03 DIAGNOSIS — C3412 Malignant neoplasm of upper lobe, left bronchus or lung: Secondary | ICD-10-CM

## 2024-07-03 DIAGNOSIS — Z5112 Encounter for antineoplastic immunotherapy: Secondary | ICD-10-CM | POA: Diagnosis not present

## 2024-07-03 MED ORDER — FILGRASTIM-SNDZ 300 MCG/0.5ML IJ SOSY: 300.0000 ug | PREFILLED_SYRINGE | Freq: Every day | INTRAMUSCULAR | Status: DC

## 2024-07-03 NOTE — Progress Notes (Signed)
 [facility] Telephone:([phone]) [phone]   [phone]  OFFICE PROGRESS NOTE  Rodney Aland, MD [address], [address] [place] [address]  DIAGNOSIS: Stage IIIC/IV (T3, N3, M0) non-small cell lung cancer, adenocarcinoma. He presented with a left upper lobe spiculated mass in addition to lingular lesion and suspicious mediastinal lymphadenopathy He also has bilateral hypermetabolic axillary lymph nodes which could be related to metastatic disease versus inflammatory as the patient did have vaccines in both of his arms the week prior to his PET scan.  This was diagnosed in August 2024.   Molecular Studies: His PD-L1 expression was 1% and he has no actionable mutations.   PRIOR THERAPY:  1) Concurrent chemoradiation with carboplatin  for an AUC of 2 and paclitaxel  45 mg/m.  First dose expected on 08/15/23.  Status post 5 cycles of first cycle was given with carboplatin  and paclitaxel  and starting from cycle #2 he was on carboplatin  and Abraxane  secondary to hypersensitivity reaction to paclitaxel . 2) Consolidation immunotherapy with Imfinzi  1500 Mg IV every 4 weeks.  First dose November 28, 2023.  Status post 2 cycles.  Discontinued secondary to intolerance and patient is requested.   CURRENT THERAPY: First-line systemic chemotherapy with carboplatin  for AUC of 4, Alimta  400 Mg/M2 and Keytruda  200 Mg IV every 3 weeks.  First dose June 20, 2024.  Starting cycle #2 Keytruda  will be discontinued secondary to intolerance with significant itching and patient's preference.  INTERVAL HISTORY: AMI Stevens 83 y.o. male returns to the clinic today for follow-up visit. Discussed the use of AI scribe software for clinical note transcription with the patient, who gave verbal consent to proceed.  History of Present Illness [name] is an 83 year old male with stage four non-small cell lung cancer who presents for evaluation and repeat blood work.  He has stage four non-small cell  lung cancer, adenocarcinoma subtype, with no actionable mutation and PD-L1 expression of one percent. He was previously treated with concurrent chemoradiation using weekly carboplatin  and paclitaxel , followed by two cycles of consolidation immunotherapy, which was discontinued at his request. Due to disease progression, he started palliative systemic chemotherapy with carboplatin , Alimta  400 mg/m2, and Keytruda  200 mg IV every three weeks. He is currently status post one cycle of this regimen.  He experiences pain and burning sensations in the middle of his chest, extending down to his belly, under his arms, and affecting his face and the back of his ears. He describes the sensation as 'itch and burn' and notes that it began suddenly. He also reports persistent itching of his head. These symptoms are similar to those he experienced during previous immunotherapy treatments.  His symptoms have been present since starting the current treatment regimen. He recalls having similar issues, including itching and burning, during past treatments with immunotherapy.      MEDICAL HISTORY: Past Medical History:  Diagnosis Date   AICD (automatic cardioverter/defibrillator) present    CAD (coronary artery disease) 80% stenosis diag of the LAD, 30% in OM2 branch of LCX in 2009    a. Nonobstructive CAD by cath 11/2011 with the exception of the pre-existing diagonal branch #2 lesion.   Chronic systolic CHF (congestive heart failure) (HCC)    CKD (chronic kidney disease) stage 3, GFR 30-59 ml/min (HCC)    Colon polyp, hyperplastic    History of kidney stones    History of radiation therapy    Left Lung- 08/17/23-09/29/23- Dr. Lynwood Nasuti   History  of stress test 06/01/2012   Normal myocardial perfusion study. compared to the previous study there is no significant change. this is a low risk scan   Hypertension    Legally blind    both eyes   Myocardial infarction [facility]) 11/22/2011   NICM (nonischemic  cardiomyopathy) (HCC)    a. Remote hx of dilated NICM with EF ranging 20-45%, including normal EF by echo (55-60%) in 2014.   Peripheral arterial disease (HCC)    a. 06/2014: ABI right 0.99, left 1.2, LE dopplers revealing an occluded right posterior tibial. As symptoms were not felt r/t claudication, no further w/u at the time.   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 Requiring discontinuation of BB dose.   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  MEDICATIONS:  Current Outpatient Medications  Medication Sig Dispense Refill   apixaban  (ELIQUIS ) 2.5 MG TABS tablet Take 1 tablet (2.5 mg total) by mouth 2 (two) times daily. 180 tablet 1   BREO ELLIPTA  100-25 MCG/INH AEPB Inhale 1 puff into the lungs every morning.     brimonidine  (ALPHAGAN ) 0.2 % ophthalmic solution Place 1 drop into both eyes 2 (two) times daily.     cycloSPORINE  (RESTASIS ) 0.05 % ophthalmic emulsion Place 1 drop into both eyes 2 (two) times daily.     dorzolamide -timolol  (COSOPT ) 22.3-6.8 MG/ML ophthalmic solution Place 1 drop into both eyes 2 (two) times daily.     ENTRESTO  24-26 MG TAKE 1 TABLET BY MOUTH TWICE DAILY 180 tablet 3   folic acid  (FOLVITE ) 1 MG tablet Take 1 tablet (1 mg total) by mouth daily. Start 7 days before pemetrexed  chemotherapy. Continue until 21 days after pemetrexed  completed. 100 tablet 3   furosemide  (LASIX ) 20 MG tablet Take 0.5 tablets (10 mg total) by mouth daily. 30 tablet 1   Glucagon , rDNA, (GLUCAGON  EMERGENCY) 1 MG KIT Inject 1 mg into the skin as needed for up to 2 doses (Severe low blood sugar). 1 kit 0   hydrOXYzine  (ATARAX ) 10 MG tablet Take 1 tablet (10 mg total) by mouth at bedtime. 30 tablet 0   insulin  isophane & regular human KwikPen (HUMULIN 70/30 MIX) (70-30)  100 UNIT/ML KwikPen Inject 25 Units into the skin in the morning and at bedtime. 15 mL 0   isosorbide  mononitrate (IMDUR ) 30 MG 24 hr tablet Take 30 mg by mouth daily.     lidocaine  (LIDODERM ) 5 % Place 1 patch onto the skin daily. Remove & Discard patch within 12 hours or as directed by MD 30 patch 0   metoprolol  succinate (TOPROL -XL) 25 MG 24 hr tablet Take 0.5 tablets (12.5 mg total) by mouth daily. 15 tablet 1   ondansetron  (ZOFRAN ) 8 MG tablet Take 1 tablet (8 mg total) by mouth every 8 (eight) hours as needed for nausea or vomiting. Start on the third day after carboplatin . 30 tablet 1   ROCKLATAN  0.02-0.005 % SOLN Place 1 drop into both eyes at bedtime.     spironolactone  (ALDACTONE ) 25 MG tablet Take 12.5 mg by mouth daily.     No current facility-administered medications for this visit.    SURGICAL HISTORY:  Past Surgical History:  Procedure Laterality Date   BACK SURGERY     BIV UPGRADE N/A 11/08/2019   Procedure: BIV UPGRADE;  Surgeon: Waddell Danelle ORN, MD;  Location: [facility] INVASIVE CV LAB;  Service: Cardiovascular;  Laterality: N/A;   BRONCHIAL BIOPSY  07/05/2023   Procedure: BRONCHIAL BIOPSIES;  Surgeon: Shelah Lamar RAMAN, MD;  Location: [facility] ENDOSCOPY;  Service: Pulmonary;;   BRONCHIAL BRUSHINGS  07/05/2023   Procedure: BRONCHIAL BRUSHINGS;  Surgeon: Shelah Lamar RAMAN, MD;  Location: [facility] ENDOSCOPY;  Service: Pulmonary;;   BRONCHIAL NEEDLE ASPIRATION BIOPSY  07/05/2023   Procedure: BRONCHIAL NEEDLE ASPIRATION BIOPSIES;  Surgeon: Shelah Lamar RAMAN, MD;  Location: [facility] ENDOSCOPY;  Service: Pulmonary;;   BRONCHIAL WASHINGS  07/05/2023   Procedure: BRONCHIAL WASHINGS;  Surgeon: Shelah Lamar RAMAN, MD;  Location: [facility] ENDOSCOPY;  Service: Pulmonary;;   CARDIAC CATHETERIZATION  11/2011   CARDIAC CATHETERIZATION  11/2011   didn't demonstrate high grade obstructive disease to account for his LV dysfunction.   CATARACT EXTRACTION, BILATERAL  1990's   CYSTOSCOPY     CYSTOSCOPY WITH BIOPSY Bilateral 08/03/2021    Procedure: CYSTOSCOPY WITH BLADDER BIOPSY, BILATERAL RETROGRADE PYELOGRAM;  Surgeon: Carolee Sherwood JONETTA DOUGLAS, MD;  Location: [facility] ORS;  Service: Urology;  Laterality: Bilateral;  REQUESTING 45 MINS   CYSTOSCOPY WITH URETHRAL DILATATION N/A 05/04/2013   Procedure: CYSTOSCOPY WITH URETHRAL DILATATION;  Surgeon: Alm RAMAN Molt, MD;  Location: [facility] NEURO ORS;  Service: Neurosurgery;  Laterality: N/A;  with insertion of foley catheter   EP study and ablation of VT  7/13   PVC focus mapped to the right coronary cusp of the aorta, limited ablation performed due to proximity of the focus to the right coronary artery   EYE SURGERY     FINE NEEDLE ASPIRATION  07/05/2023   Procedure: FINE NEEDLE ASPIRATION (FNA) LINEAR;  Surgeon: Shelah Lamar RAMAN, MD;  Location: [facility] ENDOSCOPY;  Service: Pulmonary;;   LEFT HEART CATH AND CORONARY ANGIOGRAPHY N/A 11/07/2019   Procedure: LEFT HEART CATH AND CORONARY ANGIOGRAPHY;  Surgeon: Darron Deatrice LABOR, MD;  Location: [facility] INVASIVE CV LAB;  Service: Cardiovascular;  Laterality: N/A;   LEFT HEART CATHETERIZATION WITH CORONARY ANGIOGRAM N/A 11/25/2011   Procedure: LEFT HEART CATHETERIZATION WITH CORONARY ANGIOGRAM;  Surgeon: Alm ORN Clay, MD;  Location: [facility] CATH LAB;  Service: Cardiovascular;  Laterality: N/A;   PACEMAKER IMPLANT N/A 07/12/2018   Procedure: PACEMAKER IMPLANT;  Surgeon: Francyne Headland, MD;  Location: [facility] INVASIVE CV LAB;  Service: Cardiovascular;  Laterality: N/A;   POSTERIOR FUSION LUMBAR SPINE  1979   ROTATOR CUFF REPAIR  2000's   left   V-TACH ABLATION N/A 06/06/2012   Procedure: V-TACH ABLATION;  Surgeon: Lynwood Rakers, MD;  Location: [facility] CATH LAB;  Service: Cardiovascular;  Laterality: N/A;   VIDEO BRONCHOSCOPY WITH ENDOBRONCHIAL ULTRASOUND N/A 07/05/2023   Procedure: VIDEO BRONCHOSCOPY WITH ENDOBRONCHIAL ULTRASOUND;  Surgeon: Shelah Lamar RAMAN, MD;  Location: [facility] ENDOSCOPY;  Service: Pulmonary;  Laterality: N/A;    REVIEW OF SYSTEMS:  Constitutional: positive for  fatigue Eyes: negative Ears, nose, mouth, throat, and face: negative Respiratory: positive for cough and dyspnea on exertion Cardiovascular: negative Gastrointestinal: negative Genitourinary:negative Integument/breast: positive for pruritus and rash Hematologic/lymphatic: negative Musculoskeletal:negative Neurological: negative Behavioral/Psych: negative Endocrine: negative Allergic/Immunologic: negative   PHYSICAL EXAMINATION: General appearance: alert, cooperative, fatigued, and no distress Head: Normocephalic, without obvious abnormality, atraumatic Neck: no adenopathy, no JVD, supple, symmetrical, trachea midline, and thyroid  not enlarged, symmetric, no tenderness/mass/nodules Lymph nodes: Cervical, supraclavicular, and axillary nodes normal. Resp: clear to auscultation bilaterally Back: symmetric, no curvature. ROM normal. No CVA tenderness. Cardio: regular rate and rhythm, S1, S2 normal, no murmur, click,  rub or gallop GI: soft, non-tender; bowel sounds normal; no masses,  no organomegaly Extremities: extremities normal, atraumatic, no cyanosis or edema Neurologic: Alert and oriented X 3, normal strength and tone. Normal symmetric reflexes. Normal coordination and gait  ECOG PERFORMANCE STATUS: 1 - Symptomatic but completely ambulatory  Blood pressure 133/70, pulse 73, temperature 98.1 F (36.7 C), resp. rate 16, height 5' 7 (1.702 m), weight 151 lb (68.5 kg), SpO2 100%.  LABORATORY DATA: Lab Results  Component Value Date   WBC 1.1 (L) 07/02/2024   HGB 11.6 (L) 07/02/2024   HCT 34.9 (L) 07/02/2024   MCV 88.6 07/02/2024   PLT 80 (L) 07/02/2024      Chemistry      Component Value Date/Time   NA 137 07/02/2024 1512   NA 142 11/11/2022 1000   K 4.0 07/02/2024 1512   CL 104 07/02/2024 1512   CO2 26 07/02/2024 1512   BUN 26 (H) 07/02/2024 1512   BUN 18 11/11/2022 1000   CREATININE 1.49 (H) 07/02/2024 1512   CREATININE 1.37 (H) 01/24/2017 0950      Component  Value Date/Time   CALCIUM  8.7 (L) 07/02/2024 1512   ALKPHOS 53 07/02/2024 1512   AST 14 (L) 07/02/2024 1512   ALT 10 07/02/2024 1512   BILITOT 0.6 07/02/2024 1512       RADIOGRAPHIC STUDIES: CUP PACEART REMOTE DEVICE CHECK Result Date: 06/13/2024 ICD Scheduled remote reviewed. Normal device function.  Presenting rhythm: AP/BiV pace 4 NSVT, V>A, HRps 179-202, 9-28 beats in duration 15 AMS EGM's, longest durtion 4hrs , overall controlled rates, Eliquis  per EPIC Next remote 91 days. LA, CVRS  DG Chest Portable 1 View Result Date: 06/09/2024 CLINICAL DATA:  Chest pain.  History of non-small cell lung cancer. EXAM: PORTABLE CHEST 1 VIEW COMPARISON:  CT 05/31/2024 FINDINGS: Left chest wall ICD noted with leads in the right atrial appendage, coronary sinus and right ventricle. Heart size and mediastinal contours are stable. No significant pleural effusion. Opacity within the apical segment of the left upper lobe corresponding to treated lung cancer. Persistent masslike opacity within the lingula is unchanged from the CT dated 05/31/2024. Decreased aeration to the left lung base may reflect atelectasis or airspace disease. Mild blunting of the left costophrenic angle. Visualized osseous structures appear grossly intact. IMPRESSION: 1. Decreased aeration to the left lung base may reflect atelectasis or airspace disease. 2. Persistent mass-like opacity within the lingula is unchanged from the CT dated 05/31/2024. 3. Opacity within the apical segment of the left upper lobe corresponding to treated lung cancer. Electronically Signed   By: Waddell Calk M.D.   On: 06/09/2024 12:03    ASSESSMENT AND PLAN: This is a very pleasant 83 years old African-American male with Stage IIIC/IV (T3, N3, M0) non-small cell lung cancer, adenocarcinoma. He presented with a left upper lobe spiculated mass in addition to lingular lesion and suspicious mediastinal lymphadenopathy. He started a course of concurrent  chemoradiation initially with carboplatin  for AUC of 2 and paclitaxel  45 Mg/M2 but paclitaxel  was the changed to Abraxane  starting from cycle #2 secondary to hypersensitivity reaction.  The patient received 5 cycles of concurrent chemoradiation.  He tolerated this treatment fairly well except for fatigue. The patient is currently on consolidation treatment with immunotherapy with Imfinzi  1500 Mg IV every 4 weeks.  Status post 2 cycles.  He has been tolerating this treatment well except for the itching and rash The patient he in the lower abdomen and back.  He decided to  stop his treatment at that time.  Last dose was given in February 2025.  He is currently on observation. He had no evidence for disease recurrence in July 2025 and he started palliative systemic chemoimmunotherapy with carboplatin  for AUC of 4, Alimta  400 Mg/M2 and Keytruda  200 Mg IV every 3 weeks.  Status post 1 cycle.  First cycle was given on June 20, 2024.  He has a rough time with pruritus as well as itching which is likely secondary to Keytruda .  He has similar symptoms when he was on Imfinzi  in the past. We will discontinue Keytruda  starting from cycle #2. For the chemotherapy-induced neutropenia, we will arrange for the patient to receive filgrastim  injection 300 mcg subcutaneously daily for 3 days. Assessment and Plan Assessment & Plan Stage 4 non-small cell lung cancer, adenocarcinoma Stage 4 non-small cell lung cancer, adenocarcinoma with no actionable mutation and PD-L1 expression of 1%. Previously treated with concurrent chemoradiation and immunotherapy, which was discontinued due to patient preference. Currently on palliative systemic chemotherapy with carboplatin , Alimta , and Keytruda . After one cycle, experiencing adverse reactions likely due to Keytruda . - Discontinue Keytruda  due to adverse reactions. - Continue chemotherapy with carboplatin  and Alimta . - Evaluate response to chemotherapy in two weeks.  Adverse reaction  to immunotherapy (itching, burning, rash, pain) Experiencing itching, burning, rash, and pain, likely due to Keytruda . Similar symptoms were experienced with previous immunotherapy (Imfinzi ). - Prescribe Medrol  dose pack to alleviate itching and burning. - Discontinue Keytruda  due to adverse reactions.  Chemotherapy-induced neutropenia Absolute neutrophil count is low at 400, indicating neutropenia, increasing risk of infection. Filgrastim  or biosimilar will be used to enhance white blood cell recovery. - Administer injection today and tomorrow to boost white blood cell count. He will come back for follow-up visit in 2 weeks for evaluation before starting cycle #2. He was advised to call immediately if he has any other concerning symptoms in the interval. The patient voices understanding of current disease status and treatment options and is in agreement with the current care plan.  All questions were answered. The patient knows to call the clinic with any problems, questions or concerns. We can certainly see the patient much sooner if necessary.  The total time spent in the appointment was 35 minutes.  Disclaimer: This note was dictated with voice recognition software. Similar sounding words can inadvertently be transcribed and may not be corrected upon review.

## 2024-07-04 ENCOUNTER — Inpatient Hospital Stay

## 2024-07-04 ENCOUNTER — Telehealth: Payer: Self-pay

## 2024-07-04 VITALS — BP 121/70 | HR 72 | Temp 98.1°F

## 2024-07-04 DIAGNOSIS — T451X5A Adverse effect of antineoplastic and immunosuppressive drugs, initial encounter: Secondary | ICD-10-CM

## 2024-07-04 DIAGNOSIS — Z5112 Encounter for antineoplastic immunotherapy: Secondary | ICD-10-CM | POA: Diagnosis not present

## 2024-07-04 MED ORDER — FILGRASTIM-SNDZ 300 MCG/0.5ML IJ SOSY
300.0000 ug | PREFILLED_SYRINGE | Freq: Every day | INTRAMUSCULAR | Status: DC
  Administered 2024-07-04 (×2): 300 ug via SUBCUTANEOUS
  Filled 2024-07-04: qty 0.5

## 2024-07-04 NOTE — Telephone Encounter (Signed)
 Spoke with patient to confirm that Zarxio  injection was approved.  Reminded patient of todays appt and upcoming appts on Thursday and Friday. He voiced understanding.

## 2024-07-05 ENCOUNTER — Encounter: Payer: Self-pay | Admitting: Podiatry

## 2024-07-05 ENCOUNTER — Inpatient Hospital Stay

## 2024-07-05 ENCOUNTER — Ambulatory Visit (INDEPENDENT_AMBULATORY_CARE_PROVIDER_SITE_OTHER): Admitting: Podiatry

## 2024-07-05 VITALS — BP 117/65 | HR 70 | Temp 97.8°F | Resp 16

## 2024-07-05 DIAGNOSIS — D689 Coagulation defect, unspecified: Secondary | ICD-10-CM | POA: Diagnosis not present

## 2024-07-05 DIAGNOSIS — B351 Tinea unguium: Secondary | ICD-10-CM | POA: Diagnosis not present

## 2024-07-05 DIAGNOSIS — Z5112 Encounter for antineoplastic immunotherapy: Secondary | ICD-10-CM | POA: Diagnosis not present

## 2024-07-05 DIAGNOSIS — M79674 Pain in right toe(s): Secondary | ICD-10-CM

## 2024-07-05 DIAGNOSIS — N1831 Chronic kidney disease, stage 3a: Secondary | ICD-10-CM

## 2024-07-05 DIAGNOSIS — T451X5A Adverse effect of antineoplastic and immunosuppressive drugs, initial encounter: Secondary | ICD-10-CM

## 2024-07-05 DIAGNOSIS — M79675 Pain in left toe(s): Secondary | ICD-10-CM | POA: Diagnosis not present

## 2024-07-05 MED ORDER — FILGRASTIM-SNDZ 300 MCG/0.5ML IJ SOSY
300.0000 ug | PREFILLED_SYRINGE | Freq: Every day | INTRAMUSCULAR | Status: DC
  Administered 2024-07-05: 300 ug via SUBCUTANEOUS
  Filled 2024-07-05: qty 0.5

## 2024-07-05 NOTE — Progress Notes (Addendum)
 This patient returns to my office for at risk foot care.  This patient requires this care by a professional since this patient will be at risk due to having , PAD and  Chronic kidney disease.  .  This patient is unable to cut nails himself since the patient cannot reach his nails.These nails are painful walking and wearing shoes.  This patient presents for at risk foot care today.  General Appearance  Alert, conversant and in no acute stress.  Vascular  Dorsalis pedis and posterior tibial  pulses are palpable  bilaterally.  Capillary return is within normal limits  bilaterally. Temperature is within normal limits  bilaterally.  Neurologic  Senn-Weinstein monofilament wire test within normal limits  bilaterally. Muscle power within normal limits bilaterally.  Nails Thick disfigured discolored nails with subungual debris  from hallux to fifth toes bilaterally. No evidence of bacterial infection or drainage bilaterally.  Orthopedic  No limitations of motion  feet .  No crepitus or effusions noted.  No bony pathology or digital deformities noted.  HAV  B/L.  Skin  normotropic skin with no porokeratosis noted bilaterally.  No signs of infections or ulcers noted.     Onychomycosis  Pain in right toes  Pain in left toes  Consent was obtained for treatment procedures.   Mechanical debridement of nails 1-5  bilaterally performed with a nail nipper.  Filed with dremel without incident.    Return office visit   3 months                   Told patient to return for periodic foot care and evaluation due to potential at risk complications.   Cordella Bold DPM tomma

## 2024-07-06 ENCOUNTER — Inpatient Hospital Stay

## 2024-07-06 VITALS — BP 106/67 | HR 100 | Resp 16

## 2024-07-06 DIAGNOSIS — Z5112 Encounter for antineoplastic immunotherapy: Secondary | ICD-10-CM | POA: Diagnosis not present

## 2024-07-06 DIAGNOSIS — T451X5A Adverse effect of antineoplastic and immunosuppressive drugs, initial encounter: Secondary | ICD-10-CM

## 2024-07-06 MED ORDER — FILGRASTIM-SNDZ 300 MCG/0.5ML IJ SOSY
300.0000 ug | PREFILLED_SYRINGE | Freq: Once | INTRAMUSCULAR | Status: AC
  Administered 2024-07-06: 300 ug via SUBCUTANEOUS
  Filled 2024-07-06: qty 0.5

## 2024-07-09 ENCOUNTER — Inpatient Hospital Stay

## 2024-07-09 DIAGNOSIS — Z5112 Encounter for antineoplastic immunotherapy: Secondary | ICD-10-CM | POA: Diagnosis not present

## 2024-07-09 DIAGNOSIS — C3412 Malignant neoplasm of upper lobe, left bronchus or lung: Secondary | ICD-10-CM

## 2024-07-09 LAB — CBC WITH DIFFERENTIAL (CANCER CENTER ONLY)
Abs Immature Granulocytes: 0.42 K/uL — ABNORMAL HIGH (ref 0.00–0.07)
Basophils Absolute: 0.1 K/uL (ref 0.0–0.1)
Basophils Relative: 2 %
Eosinophils Absolute: 0.1 K/uL (ref 0.0–0.5)
Eosinophils Relative: 2 %
HCT: 31.3 % — ABNORMAL LOW (ref 39.0–52.0)
Hemoglobin: 10.1 g/dL — ABNORMAL LOW (ref 13.0–17.0)
Immature Granulocytes: 10 %
Lymphocytes Relative: 17 %
Lymphs Abs: 0.7 K/uL (ref 0.7–4.0)
MCH: 28.6 pg (ref 26.0–34.0)
MCHC: 32.3 g/dL (ref 30.0–36.0)
MCV: 88.7 fL (ref 80.0–100.0)
Monocytes Absolute: 0.7 K/uL (ref 0.1–1.0)
Monocytes Relative: 16 %
Neutro Abs: 2.3 K/uL (ref 1.7–7.7)
Neutrophils Relative %: 53 %
Platelet Count: 100 K/uL — ABNORMAL LOW (ref 150–400)
RBC: 3.53 MIL/uL — ABNORMAL LOW (ref 4.22–5.81)
RDW: 12.7 % (ref 11.5–15.5)
Smear Review: NORMAL
WBC Count: 4.2 K/uL (ref 4.0–10.5)
nRBC: 0 % (ref 0.0–0.2)

## 2024-07-09 LAB — CMP (CANCER CENTER ONLY)
ALT: 9 U/L (ref 0–44)
AST: 11 U/L — ABNORMAL LOW (ref 15–41)
Albumin: 3.3 g/dL — ABNORMAL LOW (ref 3.5–5.0)
Alkaline Phosphatase: 52 U/L (ref 38–126)
Anion gap: 6 (ref 5–15)
BUN: 14 mg/dL (ref 8–23)
CO2: 26 mmol/L (ref 22–32)
Calcium: 8.7 mg/dL — ABNORMAL LOW (ref 8.9–10.3)
Chloride: 108 mmol/L (ref 98–111)
Creatinine: 1.13 mg/dL (ref 0.61–1.24)
GFR, Estimated: >60 mL/min (ref >60)
Glucose, Bld: 90 mg/dL (ref 70–99)
Potassium: 3.9 mmol/L (ref 3.5–5.1)
Sodium: 140 mmol/L (ref 135–145)
Total Bilirubin: 0.4 mg/dL (ref 0.0–1.2)
Total Protein: 6.8 g/dL (ref 6.5–8.1)

## 2024-07-12 ENCOUNTER — Emergency Department (HOSPITAL_COMMUNITY)

## 2024-07-12 ENCOUNTER — Other Ambulatory Visit: Payer: Self-pay

## 2024-07-12 ENCOUNTER — Encounter (HOSPITAL_COMMUNITY): Payer: Self-pay | Admitting: Emergency Medicine

## 2024-07-12 ENCOUNTER — Emergency Department (HOSPITAL_COMMUNITY)
Admission: EM | Admit: 2024-07-12 | Discharge: 2024-07-12 | Disposition: A | Discharge status: Home / Self Care | Attending: Emergency Medicine | Admitting: Emergency Medicine

## 2024-07-12 DIAGNOSIS — R079 Chest pain, unspecified: Secondary | ICD-10-CM

## 2024-07-12 DIAGNOSIS — I2510 Atherosclerotic heart disease of native coronary artery without angina pectoris: Secondary | ICD-10-CM | POA: Diagnosis not present

## 2024-07-12 DIAGNOSIS — I130 Hypertensive heart and chronic kidney disease with heart failure and stage 1 through stage 4 chronic kidney disease, or unspecified chronic kidney disease: Secondary | ICD-10-CM | POA: Diagnosis not present

## 2024-07-12 DIAGNOSIS — J90 Pleural effusion, not elsewhere classified: Secondary | ICD-10-CM | POA: Diagnosis not present

## 2024-07-12 DIAGNOSIS — Z85118 Personal history of other malignant neoplasm of bronchus and lung: Secondary | ICD-10-CM | POA: Diagnosis not present

## 2024-07-12 DIAGNOSIS — R1013 Epigastric pain: Secondary | ICD-10-CM | POA: Diagnosis not present

## 2024-07-12 DIAGNOSIS — N183 Chronic kidney disease, stage 3 unspecified: Secondary | ICD-10-CM | POA: Insufficient documentation

## 2024-07-12 DIAGNOSIS — J449 Chronic obstructive pulmonary disease, unspecified: Secondary | ICD-10-CM | POA: Insufficient documentation

## 2024-07-12 DIAGNOSIS — J181 Lobar pneumonia, unspecified organism: Secondary | ICD-10-CM | POA: Diagnosis not present

## 2024-07-12 DIAGNOSIS — Z95810 Presence of automatic (implantable) cardiac defibrillator: Secondary | ICD-10-CM | POA: Diagnosis not present

## 2024-07-12 DIAGNOSIS — I5022 Chronic systolic (congestive) heart failure: Secondary | ICD-10-CM | POA: Insufficient documentation

## 2024-07-12 DIAGNOSIS — R42 Dizziness and giddiness: Secondary | ICD-10-CM | POA: Diagnosis not present

## 2024-07-12 DIAGNOSIS — J189 Pneumonia, unspecified organism: Secondary | ICD-10-CM

## 2024-07-12 LAB — COMPREHENSIVE METABOLIC PANEL WITH GFR
ALT: 12 U/L (ref 0–44)
AST: 18 U/L (ref 15–41)
Albumin: 2.7 g/dL — ABNORMAL LOW (ref 3.5–5.0)
Alkaline Phosphatase: 50 U/L (ref 38–126)
Anion gap: 10 (ref 5–15)
BUN: 10 mg/dL (ref 8–23)
CO2: 22 mmol/L (ref 22–32)
Calcium: 8.9 mg/dL (ref 8.9–10.3)
Chloride: 109 mmol/L (ref 98–111)
Creatinine, Ser: 1.14 mg/dL (ref 0.61–1.24)
GFR, Estimated: >60 mL/min (ref >60)
Glucose, Bld: 118 mg/dL — ABNORMAL HIGH (ref 70–99)
Potassium: 4 mmol/L (ref 3.5–5.1)
Sodium: 141 mmol/L (ref 135–145)
Total Bilirubin: 0.6 mg/dL (ref 0.0–1.2)
Total Protein: 7.1 g/dL (ref 6.5–8.1)

## 2024-07-12 LAB — URINALYSIS, ROUTINE W REFLEX MICROSCOPIC
Bacteria, UA: NONE SEEN
Bilirubin Urine: NEGATIVE
Glucose, UA: NEGATIVE mg/dL
Hgb urine dipstick: NEGATIVE
Ketones, ur: NEGATIVE mg/dL
Nitrite: POSITIVE — AB
Protein, ur: NEGATIVE mg/dL
Specific Gravity, Urine: 1.012 (ref 1.005–1.030)
pH: 6 (ref 5.0–8.0)

## 2024-07-12 LAB — CBC
HCT: 29.9 % — ABNORMAL LOW (ref 39.0–52.0)
Hemoglobin: 9.2 g/dL — ABNORMAL LOW (ref 13.0–17.0)
MCH: 29.4 pg (ref 26.0–34.0)
MCHC: 30.8 g/dL (ref 30.0–36.0)
MCV: 95.5 fL (ref 80.0–100.0)
Platelets: 138 K/uL — ABNORMAL LOW (ref 150–400)
RBC: 3.13 MIL/uL — ABNORMAL LOW (ref 4.22–5.81)
RDW: 12.7 % (ref 11.5–15.5)
WBC: 3.3 K/uL — ABNORMAL LOW (ref 4.0–10.5)
nRBC: 0 % (ref 0.0–0.2)

## 2024-07-12 LAB — TROPONIN I (HIGH SENSITIVITY)
Troponin I (High Sensitivity): 10 ng/L (ref <18)
Troponin I (High Sensitivity): 12 ng/L (ref <18)

## 2024-07-12 LAB — LIPASE, BLOOD: Lipase: 23 U/L (ref 11–51)

## 2024-07-12 MED ORDER — DOXYCYCLINE HYCLATE 100 MG PO CAPS: 100.0000 mg | ORAL_CAPSULE | Freq: Two times a day (BID) | ORAL | 0 refills | Status: DC

## 2024-07-12 MED ORDER — CEPHALEXIN 500 MG PO CAPS: 500.0000 mg | ORAL_CAPSULE | Freq: Two times a day (BID) | ORAL | 0 refills | Status: DC

## 2024-07-12 MED ORDER — IOHEXOL 350 MG/ML SOLN
75.0000 mL | Freq: Once | INTRAVENOUS | Status: AC | PRN
  Administered 2024-07-12: 75 mL via INTRAVENOUS

## 2024-07-12 MED ORDER — HYDROXYZINE HCL 25 MG PO TABS: 25.0000 mg | ORAL_TABLET | Freq: Four times a day (QID) | ORAL | 0 refills | Status: DC | PRN

## 2024-07-12 NOTE — ED Provider Notes (Signed)
  Physical Exam  BP (!) 159/93   Pulse 74   Temp 98.1 F (36.7 C)   Resp 18   Ht 5' 7 (1.702 m)   Wt 68.9 kg   SpO2 100%   BMI 23.81 kg/m   Physical Exam  Procedures  Procedures  ED Course / MDM     Received care of patient from Dr. Darra. Please see his note for prior history, physical and care.   Briefly, 83yo male with history of primary adenocarcinoma of the lung, COPD, afib, CAD, CHF, DM, who presents with on and off chest, abdominal pain with radiation to back.  CT dissection study, repeat troponin pending.  CT completed and shows a lingular pneumonia with follow-up recommended until resolution, a small left pleural effusion.  Troponin negative.  Suspect pain in chest, back related to pleural effusion, possible pneumonia. Given rx for antibiotics. Notes having hx of chronic itching and given rx for atarax  as well.  On my evaluation he also reports having stuttering speech over the last 3 days, some difficultly getting words out. It is not constant, but frequently waxing and waning and at times during my exam will speak fluently and other times will have more of a stutter without clear aphasia.  He reports similar symptoms when he had hyperglycemia in April at which time an MRI WO contrast was performed and negative.  He denies other neurologic symptoms and exam performed without other abnormalities. CT head was completed and showed no evidence of acute abnormalities.  Discussed MRI brain however has pacemaker and at this time would likely need to wait until AM for pacemaker support staff.  He is not clearly having aphasia on exam, has had similar symptoms prior without abnormality on MRI and does not want to stay at this time..  Discussed can consider outpt MRI WWO in setting of cancer hx Discussed strict return precautions. Patient discharged in stable condition with understanding of reasons to return.     Dreama Longs, MD 07/13/24 [phone]

## 2024-07-12 NOTE — ED Provider Notes (Signed)
 Emergency Department Provider Note   I have reviewed the triage vital signs and the nursing notes.   HISTORY  Chief Complaint Abdominal Pain, Chest Pain, and Dizziness   HPI [name] is a 83 y.o. male fastest reviewed below presents emergency department with central chest pain and epigastric discomfort.  No nausea and vomiting.  Symptoms had been intermittent over the past several weeks but worsening today.  No shortness of breath.  He has been compliant with his medications including Eliquis .   Past Medical History:  Diagnosis Date   AICD (automatic cardioverter/defibrillator) present    CAD (coronary artery disease) 80% stenosis diag of the LAD, 30% in OM2 branch of LCX in 2009    a. Nonobstructive CAD by cath 11/2011 with the exception of the pre-existing diagonal branch #2 lesion.   Chronic systolic CHF (congestive heart failure) (HCC)    CKD (chronic kidney disease) stage 3, GFR 30-59 ml/min (HCC)    Colon polyp, hyperplastic    History of kidney stones    History of radiation therapy    Left Lung- 08/17/23-09/29/23- Dr. Lynwood Nasuti   History of stress test 06/01/2012   Normal myocardial perfusion study. compared to the previous study there is no significant change. this is a low risk scan   Hypertension    Legally blind    both eyes   Myocardial infarction [facility]) 11/22/2011   NICM (nonischemic cardiomyopathy) (HCC)    a. Remote hx of dilated NICM with EF ranging 20-45%, including normal EF by echo (55-60%) in 2014.   Peripheral arterial disease (HCC)    a. 06/2014: ABI right 0.99, left 1.2, LE dopplers revealing an occluded right posterior tibial. As symptoms were not felt r/t claudication, no further w/u at the time.   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 Requiring discontinuation of BB dose.   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 Respiratory: Denies shortness of breath. Gastrointestinal: Positive abdominal pain.  No nausea, no vomiting.  No diarrhea.  No constipation. Genitourinary: Negative for dysuria. Musculoskeletal: Positive for back pain. Skin: Negative for rash. Neurological: Negative for headaches.  ____________________________________________   PHYSICAL EXAM:  VITAL SIGNS: ED Triage Vitals  Encounter Vitals Group     BP 07/12/24 1254 (!) 149/72     Pulse Rate 07/12/24 1254 71     Resp 07/12/24 1254 17     Temp 07/12/24 1254 98.1 F (36.7 C)     Temp src --      SpO2 07/12/24 1254 99 %     Weight 07/12/24 1309 152 lb (68.9 kg)     Height 07/12/24 1309 5' 7 (1.702 m)   Constitutional: Alert and oriented. Well appearing and in no acute distress. Eyes: Conjunctivae are normal.  Head: Atraumatic. Nose: No congestion/rhinnorhea. Mouth/Throat: Mucous membranes are moist.   Neck: No stridor.   Cardiovascular: Normal rate, regular rhythm. Good peripheral circulation. Grossly normal heart sounds.   Respiratory: Normal respiratory effort.  No retractions. Lungs CTAB. Gastrointestinal: Soft and nontender. No distention.  Musculoskeletal: No lower extremity tenderness nor edema. No gross deformities of extremities. Neurologic:  Normal speech and language. No gross focal neurologic deficits are appreciated.  Skin:  Skin is warm, dry and intact. No rash noted.   ____________________________________________   LYNNEA (  all labs ordered are listed, but only abnormal results are displayed)  Labs Reviewed  COMPREHENSIVE METABOLIC PANEL WITH GFR - Abnormal; Notable for the following components:      Result Value   Glucose, Bld 118 (*)    Albumin  2.7 (*)    All other components within normal limits  CBC - Abnormal; Notable for the following components:   WBC 3.3 (*)    RBC 3.13 (*)    Hemoglobin  9.2 (*)    HCT 29.9 (*)    Platelets 138 (*)    All other components within normal limits  URINALYSIS, ROUTINE W REFLEX MICROSCOPIC - Abnormal; Notable for the following components:   APPearance HAZY (*)    Nitrite POSITIVE (*)    Leukocytes,Ua TRACE (*)    All other components within normal limits  LIPASE, BLOOD  TROPONIN I (HIGH SENSITIVITY)  TROPONIN I (HIGH SENSITIVITY)   ____________________________________________  EKG   EKG Interpretation Date/Time:  Thursday July 12 2024 12:59:10 EDT Ventricular Rate:  95 PR Interval:  184 QRS Duration:  152 QT Interval:  446 QTC Calculation: 560 R Axis:   -80  Text Interpretation: AV dual-paced rhythm with occasional Premature ventricular complexes and Premature atrial complexes Abnormal ECG When compared with ECG of 10-Jun-2024 03:13, PREVIOUS ECG IS PRESENT Confirmed by Darra Chew [phone]) on 07/12/2024 2:19:23 PM        ____________________________________________  RADIOLOGY  DG Chest 2 View Result Date: 07/12/2024 CLINICAL DATA:  Chest pain EXAM: CHEST - 2 VIEW COMPARISON:  06/09/2024 FINDINGS: Cardiomediastinal silhouette and pulmonary vasculature are within normal limits. Postsurgical changes of the LEFT shoulder, thoracolumbar spine, and AICD are again seen. 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 RIGHT lung is well aerated. Small LEFT pleural effusion appears slightly larger since prior exam. IMPRESSION: 1. Small LEFT pleural effusion appears slightly larger since prior exam. 2. 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 Electronically Signed   By: Aliene Lloyd M.D.   On: 07/12/2024 13:46    ____________________________________________   PROCEDURES  Procedure(s) performed:   Procedures   ____________________________________________   INITIAL IMPRESSION / ASSESSMENT AND PLAN / ED COURSE  Pertinent labs & imaging results that were available  during my care of the patient were reviewed by me and considered in my medical decision making (see chart for details).   This patient is Presenting for Evaluation of CP, which does require a range of treatment options, and is a complaint that involves a high risk of morbidity and mortality.  The Differential Diagnoses includes but is not exclusive to acute coronary syndrome, aortic dissection, pulmonary embolism, cardiac tamponade, community-acquired pneumonia, pericarditis, musculoskeletal chest wall pain, etc.   Critical Interventions-    Medications - No data to display  Reassessment after intervention:     I *** Additional Historical Information from ***, as the patient is ***.  I decided to review pertinent External Data, and in summary ***.   Clinical Laboratory Tests Ordered, included first troponin normal.  No acute kidney injury.  Mild anemia to 9.2.  UA shows nitrite positive with trace leukocytes but no bacteria or WBCs.  No UTI symptoms.  Radiologic Tests Ordered, included ***. I independently interpreted the images and agree with radiology interpretation.   Cardiac Monitor Tracing which shows paced rhythm.    Social Determinants of Health Risk ***  Consult complete with  Medical Decision Making: Summary:  The patient presents emergency department with chest  pain radiating to the back with some epigastric discomfort.  Differential is broad but plan for CT dissection given acute worsening pain.  No distress.  Reevaluation with update and discussion with   ***Considered admission***  Patient's presentation is most consistent with acute presentation with potential threat to life or bodily function.   Disposition:   ____________________________________________  FINAL CLINICAL IMPRESSION(S) / ED DIAGNOSES  Final diagnoses:  None     NEW OUTPATIENT MEDICATIONS STARTED DURING THIS VISIT:  New Prescriptions   No medications on file    Note:  This document  was prepared using Dragon voice recognition software and may include unintentional dictation errors.  Fonda Law, MD, [facility]

## 2024-07-12 NOTE — ED Notes (Signed)
 Pt back from ct

## 2024-07-12 NOTE — ED Notes (Signed)
 Patient transported to CT

## 2024-07-12 NOTE — ED Notes (Signed)
Phlebotomy to collect second troponin

## 2024-07-12 NOTE — ED Notes (Signed)
 Pt discharged. Pt given discharge papers and papers explained. Pt in NAD at this time

## 2024-07-12 NOTE — ED Notes (Signed)
 Pt ambulated to restroom no complaints on way there. Coming back to room pt began to complain of chest pain. RN notified.

## 2024-07-12 NOTE — ED Triage Notes (Signed)
 Pt to ER via EMS from home with reports of LUQ abdominal pain, chest pain and intermittent dizziness.  Pt states symptoms have been ongoing since starting my treatments, some months ago.  Pt states no change in symptoms today, pain was just worse than normal. Denies n/v/d, [facility] or other associated symptoms at this time.

## 2024-07-12 NOTE — ED Provider Triage Note (Signed)
 Emergency Medicine Provider Triage Evaluation Note  [name] , a 83 y.o. male  was evaluated in triage.  Pt complains of epigastric pain, chest pain, shortness of breath which began yesterday.  Taking some Tylenol  without any improvement in symptoms.  Also complaining of some nausea and some vomiting.  Patient does have a pacemaker.  Underlying heart failure and nonobstructive CAD.  Review of Systems  Positive: Chest pain, shortness of breath, abdominal pain Negative: Fever  Physical Exam  BP (!) 149/72 (BP Location: Right Arm)   Pulse 71   Temp 98.1 F (36.7 C)   Resp 17   Ht 5' 7 (1.702 m)   Wt 68.9 kg   SpO2 99%   BMI 23.81 kg/m  Gen:   Awake, no distress   Resp:  Normal effort  MSK:   Moves extremities without difficulty  Other:    Medical Decision Making  Medically screening exam initiated at 1:20 PM.  Appropriate orders placed.  [name] Side was informed that the remainder of the evaluation will be completed by another provider, this initial triage assessment does not replace that evaluation, and the importance of remaining in the ED until their evaluation is complete.     Rodney Lahm, PA-C 07/12/24 1321

## 2024-07-13 MED FILL — Fosaprepitant Dimeglumine For IV Infusion 150 MG (Base Eq): INTRAVENOUS | Qty: 5 | Status: AC

## 2024-07-13 NOTE — Progress Notes (Signed)
 [facility] OFFICE PROGRESS NOTE  Rodney Aland, MD [address], [address] [place] [address]  DIAGNOSIS:  Stage IIIC/IV (T3, N3, M0) non-small cell lung cancer, adenocarcinoma. He presented with a left upper lobe spiculated mass in addition to lingular lesion and suspicious mediastinal lymphadenopathy He also has bilateral hypermetabolic axillary lymph nodes which could be related to metastatic disease versus inflammatory as the patient did have vaccines in both of his arms the week prior to his PET scan.  This was diagnosed in August 2024.   Molecular Studies: His PD-L1 expression was 1% and he has no actionable mutations.  PRIOR THERAPY: 1) Concurrent chemoradiation with carboplatin  for an AUC of 2 and paclitaxel  45 mg/m.  First dose expected on 08/15/23.  Status post 5 cycles of first cycle was given with carboplatin  and paclitaxel  and starting from cycle #2 he was on carboplatin  and Abraxane  secondary to hypersensitivity reaction to paclitaxel . 2) Consolidation immunotherapy with Imfinzi  1500 Mg IV every 4 weeks.  First dose November 28, 2023.  Status post 2 cycles.  Discontinued secondary to intolerance and patient is requested.  CURRENT THERAPY:  First-line systemic chemotherapy with carboplatin  for AUC of 4, Alimta  400 Mg/M2 and Keytruda  200 Mg IV every 3 weeks.  First dose June 20, 2024.  Starting cycle #2 Keytruda  will be discontinued secondary to intolerance with significant itching and patient's preference. He is status post 1 cycle.   INTERVAL HISTORY: BRAESON RUPE 83 y.o. male returns to the clinic today for a follow-up visit.  The patient was last seen in clinic by Dr. Sherrod on 07/03/2024.  In summary the patient is currently undergoing chemotherapy.  He is status post 1 cycle.  He is no longer on immunotherapy due to intolerance with itching.  In the interval since last being seen he was seen in the emergency room on 07/12/2024 for chest discomfort.  He was found to  have possible pleural effusion/pneumonia from CTA.  The pleural effusion was small. He was given antibiotics. He only started these a few days ago. He was also given Atarax  for chronic itching.  An MRI without contrast was performed due to inconsistent speech.  There was no acute abnormality.   He has been experiencing nausea and vomiting since this morning. He recalls a similar episode of nausea in the past and has been prescribed nausea medication, which he has taken twice with relief.  He still has chest discomfort. He experiences chills and night sweats, which started two days ago. No fever, but there is a decrease in appetite and a weight loss of approximately three pounds.  He experiences chest pain and side pain, which is alleviated to a more manageable level with Tylenol . He has shortness of breath, which sometimes resolves spontaneously  and comes and goes. He uses albuterol  once daily, which helps with his breathing. No frequent coughing, but there are occasional episodes.  He has a rash on his neck, similar to one experienced during previous immunotherapy treatment.  His eyes are red, and he uses four different eye drops, but the redness persists.  He does not feel up for treatment today. He is here today for evaluation and repeat blood work before undergoing cycle number 2.  MEDICAL HISTORY: Past Medical History:  Diagnosis Date   AICD (automatic cardioverter/defibrillator) present    CAD (coronary artery disease) 80% stenosis diag of the LAD, 30% in OM2 branch of LCX in 2009    a. Nonobstructive CAD by cath 11/2011  with the exception of the pre-existing diagonal branch #2 lesion.   Chronic systolic CHF (congestive heart failure) (HCC)    CKD (chronic kidney disease) stage 3, GFR 30-59 ml/min (HCC)    Colon polyp, hyperplastic    History of kidney stones    History of radiation therapy    Left Lung- 08/17/23-09/29/23- Dr. Lynwood Nasuti   History of stress test 06/01/2012   Normal  myocardial perfusion study. compared to the previous study there is no significant change. this is a low risk scan   Hypertension    Legally blind    both eyes   Myocardial infarction [facility]) 11/22/2011   NICM (nonischemic cardiomyopathy) (HCC)    a. Remote hx of dilated NICM with EF ranging 20-45%, including normal EF by echo (55-60%) in 2014.   Peripheral arterial disease (HCC)    a. 06/2014: ABI right 0.99, left 1.2, LE dopplers revealing an occluded right posterior tibial. As symptoms were not felt r/t claudication, no further w/u at the time.   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 Requiring discontinuation of BB dose.   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  MEDICATIONS:  Current Outpatient Medications  Medication Sig Dispense Refill   apixaban  (ELIQUIS ) 2.5 MG TABS tablet Take 1 tablet (2.5 mg total) by mouth 2 (two) times daily. 180 tablet 1   BREO ELLIPTA  100-25 MCG/INH AEPB Inhale 1 puff into the lungs every morning.     brimonidine  (ALPHAGAN ) 0.2 % ophthalmic solution Place 1 drop into both eyes 2 (two) times daily.     cephALEXin  (KEFLEX ) 500 MG capsule Take 1 capsule (500 mg total) by mouth 2 (two) times daily. 28 capsule 0   cycloSPORINE  (RESTASIS ) 0.05 % ophthalmic emulsion Place 1 drop into both eyes 2 (two) times daily.     dorzolamide -timolol  (COSOPT ) 22.3-6.8 MG/ML ophthalmic solution Place 1 drop into both eyes 2 (two) times daily.     doxycycline  (VIBRAMYCIN ) 100 MG capsule Take 1 capsule (100 mg total) by mouth 2 (two) times daily. 20 capsule 0   ENTRESTO  24-26 MG TAKE 1 TABLET BY MOUTH TWICE DAILY 180 tablet 3   folic acid  (FOLVITE ) 1 MG tablet Take 1 tablet (1 mg total) by mouth daily. Start 7 days before pemetrexed  chemotherapy. Continue  until 21 days after pemetrexed  completed. 100 tablet 3   furosemide  (LASIX ) 20 MG tablet Take 0.5 tablets (10 mg total) by mouth daily. 30 tablet 1   Glucagon , rDNA, (GLUCAGON  EMERGENCY) 1 MG KIT Inject 1 mg into the skin as needed for up to 2 doses (Severe low blood sugar). 1 kit 0   hydrOXYzine  (ATARAX ) 25 MG tablet Take 1 tablet (25 mg total) by mouth every 6 (six) hours as needed for itching. 12 tablet 0   insulin  isophane & regular human KwikPen (HUMULIN 70/30 MIX) (70-30) 100 UNIT/ML KwikPen Inject 25 Units into the skin in the morning and at bedtime. 15 mL 0   isosorbide  mononitrate (IMDUR ) 30 MG 24 hr tablet Take 30 mg by mouth daily.     lidocaine  (LIDODERM ) 5 % Place 1 patch onto the skin daily. Remove & Discard patch within 12 hours or as directed by MD 30 patch 0   metoprolol  succinate (TOPROL -XL) 25 MG 24 hr tablet Take 0.5 tablets (  12.5 mg total) by mouth daily. 15 tablet 1   ondansetron  (ZOFRAN ) 8 MG tablet Take 1 tablet (8 mg total) by mouth every 8 (eight) hours as needed for nausea or vomiting. Start on the third day after carboplatin . 30 tablet 1   ROCKLATAN  0.02-0.005 % SOLN Place 1 drop into both eyes at bedtime.     spironolactone  (ALDACTONE ) 25 MG tablet Take 12.5 mg by mouth daily.     No current facility-administered medications for this visit.    SURGICAL HISTORY:  Past Surgical History:  Procedure Laterality Date   BACK SURGERY     BIV UPGRADE N/A 11/08/2019   Procedure: BIV UPGRADE;  Surgeon: Waddell Danelle ORN, MD;  Location: [facility] INVASIVE CV LAB;  Service: Cardiovascular;  Laterality: N/A;   BRONCHIAL BIOPSY  07/05/2023   Procedure: BRONCHIAL BIOPSIES;  Surgeon: Shelah Lamar RAMAN, MD;  Location: [facility] ENDOSCOPY;  Service: Pulmonary;;   BRONCHIAL BRUSHINGS  07/05/2023   Procedure: BRONCHIAL BRUSHINGS;  Surgeon: Shelah Lamar RAMAN, MD;  Location: [facility] ENDOSCOPY;  Service: Pulmonary;;   BRONCHIAL NEEDLE ASPIRATION BIOPSY  07/05/2023   Procedure: BRONCHIAL NEEDLE ASPIRATION  BIOPSIES;  Surgeon: Shelah Lamar RAMAN, MD;  Location: [facility] ENDOSCOPY;  Service: Pulmonary;;   BRONCHIAL WASHINGS  07/05/2023   Procedure: BRONCHIAL WASHINGS;  Surgeon: Shelah Lamar RAMAN, MD;  Location: [facility] ENDOSCOPY;  Service: Pulmonary;;   CARDIAC CATHETERIZATION  11/2011   CARDIAC CATHETERIZATION  11/2011   didn't demonstrate high grade obstructive disease to account for his LV dysfunction.   CATARACT EXTRACTION, BILATERAL  1990's   CYSTOSCOPY     CYSTOSCOPY WITH BIOPSY Bilateral 08/03/2021   Procedure: CYSTOSCOPY WITH BLADDER BIOPSY, BILATERAL RETROGRADE PYELOGRAM;  Surgeon: Carolee Sherwood JONETTA DOUGLAS, MD;  Location: [facility] ORS;  Service: Urology;  Laterality: Bilateral;  REQUESTING 45 MINS   CYSTOSCOPY WITH URETHRAL DILATATION N/A 05/04/2013   Procedure: CYSTOSCOPY WITH URETHRAL DILATATION;  Surgeon: Alm RAMAN Molt, MD;  Location: [facility] NEURO ORS;  Service: Neurosurgery;  Laterality: N/A;  with insertion of foley catheter   EP study and ablation of VT  7/13   PVC focus mapped to the right coronary cusp of the aorta, limited ablation performed due to proximity of the focus to the right coronary artery   EYE SURGERY     FINE NEEDLE ASPIRATION  07/05/2023   Procedure: FINE NEEDLE ASPIRATION (FNA) LINEAR;  Surgeon: Shelah Lamar RAMAN, MD;  Location: [facility] ENDOSCOPY;  Service: Pulmonary;;   LEFT HEART CATH AND CORONARY ANGIOGRAPHY N/A 11/07/2019   Procedure: LEFT HEART CATH AND CORONARY ANGIOGRAPHY;  Surgeon: Darron Deatrice LABOR, MD;  Location: [facility] INVASIVE CV LAB;  Service: Cardiovascular;  Laterality: N/A;   LEFT HEART CATHETERIZATION WITH CORONARY ANGIOGRAM N/A 11/25/2011   Procedure: LEFT HEART CATHETERIZATION WITH CORONARY ANGIOGRAM;  Surgeon: Alm ORN Clay, MD;  Location: [facility] CATH LAB;  Service: Cardiovascular;  Laterality: N/A;   PACEMAKER IMPLANT N/A 07/12/2018   Procedure: PACEMAKER IMPLANT;  Surgeon: Francyne Headland, MD;  Location: [facility] INVASIVE CV LAB;  Service: Cardiovascular;  Laterality: N/A;   POSTERIOR FUSION LUMBAR  SPINE  1979   ROTATOR CUFF REPAIR  2000's   left   V-TACH ABLATION N/A 06/06/2012   Procedure: V-TACH ABLATION;  Surgeon: Lynwood Rakers, MD;  Location: [facility] CATH LAB;  Service: Cardiovascular;  Laterality: N/A;   VIDEO BRONCHOSCOPY WITH ENDOBRONCHIAL ULTRASOUND N/A 07/05/2023   Procedure: VIDEO BRONCHOSCOPY WITH ENDOBRONCHIAL ULTRASOUND;  Surgeon: Shelah Lamar RAMAN, MD;  Location: [facility] ENDOSCOPY;  Service: Pulmonary;  Laterality: N/A;  REVIEW OF SYSTEMS:   Constitutional: Positive for fatigue. Negative for appetite change, chills, fever and unexpected weight change.  HENT: Negative for mouth sores, nosebleeds, sore throat and trouble swallowing.   Eyes: erythema of the eyes bilaterally. Negative for eye problems and icterus.  Respiratory: Stable dyspnea on exertion and intermittent cough. Negative for  hemoptysis and wheezing.   Cardiovascular: Positive for occasional chest discomfort. Negative for leg swelling.  Gastrointestinal: Positive for nausea. Negative for abdominal pain, constipation, diarrhea, and vomiting.  Genitourinary: Negative for bladder incontinence, difficulty urinating, dysuria, frequency and hematuria.   Musculoskeletal: Positive for occasional low back pain. Negative for gait problem, neck pain and neck stiffness.  Skin: Positive for itching and rash.  Neurological: Negative for dizziness, extremity weakness, gait problem, light-headedness and seizures.  Hematological: Negative for adenopathy. Does not bruise/bleed easily.  Psychiatric/Behavioral: Negative for confusion, depression and sleep disturbance. The patient is not nervous/anxious.     PHYSICAL EXAMINATION:  There were no vitals taken for this visit.  ECOG PERFORMANCE STATUS: 2  Physical Exam  Constitutional: Oriented to person, place, and time and well-developed, well-nourished, and in no distress.   HENT:  Head: Normocephalic and atraumatic.  Mouth/Throat: Erythema of the conjunctiva bilaterally without  discharge. Oropharynx is clear and moist. No oropharyngeal exudate.  Eyes: Conjunctivae are normal. Right eye exhibits no discharge. Left eye exhibits no discharge. No scleral icterus.  Neck: Normal range of motion. Neck supple.  Cardiovascular: Normal rate, regular rhythm, normal heart sounds and intact distal pulses.   Pulmonary/Chest: Effort normal and breath sounds normal. No respiratory distress. No wheezes. No rales.  Abdominal: Soft. Bowel sounds are normal. Exhibits no distension and no mass. There is no tenderness.  Musculoskeletal: Normal range of motion. Exhibits no edema.  Lymphadenopathy:    No cervical adenopathy.  Neurological: Alert and oriented to person, place, and time. Exhibits muscle wasting. Uses a cane for ambulation.  Skin: Skin is warm and dry. No rash noted. Not diaphoretic. No erythema. No pallor.  Psychiatric: Mood, memory and judgment normal.  Vitals reviewed.  LABORATORY DATA: Lab Results  Component Value Date   WBC 3.3 (L) 07/12/2024   HGB 9.2 (L) 07/12/2024   HCT 29.9 (L) 07/12/2024   MCV 95.5 07/12/2024   PLT 138 (L) 07/12/2024      Chemistry      Component Value Date/Time   NA 141 07/12/2024 1325   NA 142 11/11/2022 1000   K 4.0 07/12/2024 1325   CL 109 07/12/2024 1325   CO2 22 07/12/2024 1325   BUN 10 07/12/2024 1325   BUN 18 11/11/2022 1000   CREATININE 1.14 07/12/2024 1325   CREATININE 1.13 07/09/2024 1519   CREATININE 1.37 (H) 01/24/2017 0950      Component Value Date/Time   CALCIUM  8.9 07/12/2024 1325   ALKPHOS 50 07/12/2024 1325   AST 18 07/12/2024 1325   AST 11 (L) 07/09/2024 1519   ALT 12 07/12/2024 1325   ALT 9 07/09/2024 1519   BILITOT 0.6 07/12/2024 1325   BILITOT 0.4 07/09/2024 1519       RADIOGRAPHIC STUDIES:  CT Head Wo Contrast Result Date: 07/12/2024 CLINICAL DATA:  Neuro deficit, acute, stroke suspected. Difficulty with speech. EXAM: CT HEAD WITHOUT CONTRAST TECHNIQUE: Contiguous axial images were obtained from  the base of the skull through the vertex without intravenous contrast. RADIATION DOSE REDUCTION: This exam was performed according to the departmental dose-optimization program which includes automated exposure control, adjustment of the mA and/or kV according  to patient size and/or use of iterative reconstruction technique. COMPARISON:  02/23/2024 FINDINGS: Brain: Diffuse cerebral atrophy. No acute intracranial abnormality. Specifically, no hemorrhage, hydrocephalus, mass lesion, acute infarction, or significant intracranial injury. Vascular: No hyperdense vessel or unexpected calcification. Skull: No acute calvarial abnormality. Sinuses/Orbits: No acute findings Other: None IMPRESSION: Diffuse cerebral atrophy. No acute intracranial abnormality. Electronically Signed   By: Franky Crease M.D.   On: 07/12/2024 19:37   CT Angio Chest/Abd/Pel for Dissection W and/or Wo Contrast Result Date: 07/12/2024 CLINICAL DATA:  Acute aortic syndrome suspected. EXAM: CT ANGIOGRAPHY CHEST, ABDOMEN AND PELVIS TECHNIQUE: Non-contrast CT of the chest was initially obtained. Multidetector CT imaging through the chest, abdomen and pelvis was performed using the standard protocol during bolus administration of intravenous contrast. Multiplanar reconstructed images and MIPs were obtained and reviewed to evaluate the vascular anatomy. RADIATION DOSE REDUCTION: This exam was performed according to the departmental dose-optimization program which includes automated exposure control, adjustment of the mA and/or kV according to patient size and/or use of iterative reconstruction technique. CONTRAST:  75mL OMNIPAQUE  IOHEXOL  350 MG/ML SOLN COMPARISON:  Chest CT dated 05/31/2024. FINDINGS: Evaluation is limited due to streak artifact caused by spinal hardware. CTA CHEST FINDINGS Cardiovascular: There is no cardiomegaly or pericardial effusion. There is coronary vascular calcification. Left pectoral pacemaker device. Mild atherosclerotic  calcification of the thoracic aorta. No aneurysmal dilatation or dissection. The origins of the great vessels of the aortic arch are patent. No pulmonary artery embolus identified. Mediastinum/Nodes: No obvious hilar adenopathy. Subcarinal lymph node measures 14 mm short axis. The esophagus is grossly unremarkable. No mediastinal fluid collection. Lungs/Pleura: Small left pleural effusion. There is partial compressive atelectasis of the left lower lobe. An area of consolidation in the lingula with air bronchogram most consistent with pneumonia. Underlying mass is not excluded. Follow-up to resolution recommended. Background of mild centrilobular emphysema. No pneumothorax. The central airways are patent. Musculoskeletal: Osteopenia with degenerative changes. Lower thoracic and lumbar fusion hardware. No acute osseous pathology. Review of the MIP images confirms the above findings. CTA ABDOMEN AND PELVIS FINDINGS VASCULAR Aorta: Moderate atherosclerotic calcification. Nodular dilatation or dissection. Celiac: Atherosclerotic calcification of the origin of the celiac artery. The celiac artery and its major branches are patent. SMA: Atherosclerotic calcification of the origin of the SMA. The SMA is patent. Renals: The renal arteries are patent. IMA: The IMA is patent. Inflow: Moderate atherosclerotic calcification of the iliac arteries. No aneurysmal dilatation or dissection. The iliac arteries are patent. Veins: No obvious venous abnormality within the limitations of this arterial phase study. Review of the MIP images confirms the above findings. NON-VASCULAR No intra-abdominal free air or free fluid. Hepatobiliary: The liver is unremarkable.  Cholecystectomy. Pancreas: The pancreas is unremarkable as visualized. Spleen: A 1 cm rim calcified lesion in the superior pole of the spleen is not characterized but likely sequela of prior insult. Adrenals/Urinary Tract: The adrenal glands unremarkable. Bilateral renal cysts  poorly evaluated on this CT due to severe streak artifact. There is a 4 mm nonobstructing right renal upper pole calculus and a 3 mm nonobstructing right renal interpolar stone. There is no hydronephrosis on either side. The visualized ureters and urinary bladder appear unremarkable. Stomach/Bowel: There is no bowel obstruction or active inflammation. The appendix is normal. Lymphatic: No adenopathy. Reproductive: The prostate and seminal vesicles are grossly unremarkable. Other: Midline vertical anterior abdominal wall incisional scar. Musculoskeletal: Osteopenia with degenerative changes of the spine. Lower lumbar laminectomy and posterior fusion hardware. No acute osseous pathology.  Review of the MIP images confirms the above findings. IMPRESSION: 1. No aortic aneurysm or dissection. 2. Lingular pneumonia. Follow-up to resolution recommended. 3. Small left pleural effusion with partial compressive atelectasis of the left lower lobe. 4. No acute intra-abdominal or pelvic pathology. 5. Nonobstructing right renal calculi. No hydronephrosis. 6. Aortic Atherosclerosis (ICD10-I70.0) and Emphysema (ICD10-J43.9). Electronically Signed   By: Vanetta Chou M.D.   On: 07/12/2024 15:58   DG Chest 2 View Result Date: 07/12/2024 CLINICAL DATA:  Chest pain EXAM: CHEST - 2 VIEW COMPARISON:  06/09/2024 FINDINGS: Cardiomediastinal silhouette and pulmonary vasculature are within normal limits. Postsurgical changes of the LEFT shoulder, thoracolumbar spine, and AICD are again seen. 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 RIGHT lung is well aerated. Small LEFT pleural effusion appears slightly larger since prior exam. IMPRESSION: 1. Small LEFT pleural effusion appears slightly larger since prior exam. 2. 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 Electronically Signed   By: Aliene Lloyd M.D.   On: 07/12/2024 13:46     ASSESSMENT/PLAN:  This is a very  pleasant 83 year old African-American male with suspicious stage IIIC/IV (T3, N3, M0) non-small cell lung cancer, adenocarcinoma. He presented with a left upper lobe lung mass and lesion in the lingula.  He also has bilateral mediastinal lymph nodes and hypermetabolic bilateral axillary lymph nodes.  Although it is unclear if the axillary lymph nodes are related to metastatic disease or inflammatory as he did have bilateral vaccines in his upper arms just prior to his PET scan and his axillary biopsy was negative. He was He was diagnosed in August 2024.    His molecular studies show no actionable mutations and his PD-L1 expression is 1%   He started a course of concurrent chemoradiation initially with carboplatin  for AUC of 2 and paclitaxel  45 Mg/M2 but paclitaxel  was the changed to Abraxane  starting from cycle #2 secondary to hypersensitivity reaction.  The patient received 5 cycles of concurrent chemoradiation.  He tolerated this treatment fairly well except for fatigue.  The patient then underwent consolidation immunotherapy with Imfinzi  1500 mg IV every 4 weeks.  He status post 2 cycles.  He developed significant itching and rash he decided to stop treatment at that time and the last dose was given in February 2025.  In July 2025 he started having evidence of disease recurrence in July 2025.  Therefore Dr. Sherrod started him on palliative systemic chemotherapy with carboplatin  for an AUC of 5 and Alimta  500 mg/m.  He is status post 1 cycle.  The patient refused Keytruda  due to prior intolerance with immunotherapy.   Patient does not feel up for treatment today.  He still undergoing antibiotics for possible pneumonia.  I talked with Dr. Sherrod.  We will defer his treatment care plan by 1 week to allow more time for him to complete his antibiotics.    Labs were reviewed.  CMP is pending but if there is any concerns we will contact him.  The patient declined IV fluids and antiemetics.  His nausea  is controlled with his antiemetics.  He will continue to use hydrocortisone  cream and antihistamines for rash.  Given his age he may want to consider switching to Claritin  or Zyrtec from Atarax .  The patient has erythema of his eyes bilaterally.  He will continue with the eyedrops prescribed by his eye doctor.  If seeing symptoms he is advised to follow-up with his eye doctor.  He will see Dr.  Mohamed or myself prior to assuming treatment next week.  We will revisit his dose of treatment.  The patient was advised to call immediately if he has any concerning symptoms in the interval. The patient voices understanding of current disease status and treatment options and is in agreement with the current care plan. All questions were answered. The patient knows to call the clinic with any problems, questions or concerns. We can certainly see the patient much sooner if necessary   No orders of the defined types were placed in this encounter.    The total time spent in the appointment was 20-29 minutes  Quince Santana L Kelechi Astarita, PA-C 07/13/24

## 2024-07-16 ENCOUNTER — Inpatient Hospital Stay

## 2024-07-16 ENCOUNTER — Inpatient Hospital Stay (HOSPITAL_BASED_OUTPATIENT_CLINIC_OR_DEPARTMENT_OTHER): Admitting: Physician Assistant

## 2024-07-16 ENCOUNTER — Other Ambulatory Visit: Payer: Self-pay | Admitting: Physician Assistant

## 2024-07-16 VITALS — BP 123/70 | HR 75 | Temp 97.6°F | Resp 18 | Ht 67.0 in | Wt 148.0 lb

## 2024-07-16 DIAGNOSIS — C3412 Malignant neoplasm of upper lobe, left bronchus or lung: Secondary | ICD-10-CM | POA: Diagnosis not present

## 2024-07-16 DIAGNOSIS — Z5112 Encounter for antineoplastic immunotherapy: Secondary | ICD-10-CM | POA: Diagnosis not present

## 2024-07-16 LAB — CBC WITH DIFFERENTIAL (CANCER CENTER ONLY)
Abs Immature Granulocytes: 0.07 K/uL (ref 0.00–0.07)
Basophils Absolute: 0 K/uL (ref 0.0–0.1)
Basophils Relative: 1 %
Eosinophils Absolute: 0.1 K/uL (ref 0.0–0.5)
Eosinophils Relative: 2 %
HCT: 33.2 % — ABNORMAL LOW (ref 39.0–52.0)
Hemoglobin: 10.7 g/dL — ABNORMAL LOW (ref 13.0–17.0)
Immature Granulocytes: 1 %
Lymphocytes Relative: 13 %
Lymphs Abs: 0.7 K/uL (ref 0.7–4.0)
MCH: 29.2 pg (ref 26.0–34.0)
MCHC: 32.2 g/dL (ref 30.0–36.0)
MCV: 90.5 fL (ref 80.0–100.0)
Monocytes Absolute: 0.5 K/uL (ref 0.1–1.0)
Monocytes Relative: 9 %
Neutro Abs: 3.9 K/uL (ref 1.7–7.7)
Neutrophils Relative %: 74 %
Platelet Count: 191 K/uL (ref 150–400)
RBC: 3.67 MIL/uL — ABNORMAL LOW (ref 4.22–5.81)
RDW: 13.1 % (ref 11.5–15.5)
WBC Count: 5.2 K/uL (ref 4.0–10.5)
nRBC: 0 % (ref 0.0–0.2)

## 2024-07-16 LAB — CMP (CANCER CENTER ONLY)
ALT: 8 U/L (ref 0–44)
AST: 11 U/L — ABNORMAL LOW (ref 15–41)
Albumin: 3.5 g/dL (ref 3.5–5.0)
Alkaline Phosphatase: 53 U/L (ref 38–126)
Anion gap: 6 (ref 5–15)
BUN: 18 mg/dL (ref 8–23)
CO2: 25 mmol/L (ref 22–32)
Calcium: 9.6 mg/dL (ref 8.9–10.3)
Chloride: 108 mmol/L (ref 98–111)
Creatinine: 1.32 mg/dL — ABNORMAL HIGH (ref 0.61–1.24)
GFR, Estimated: 53 mL/min — ABNORMAL LOW (ref >60)
Glucose, Bld: 114 mg/dL — ABNORMAL HIGH (ref 70–99)
Potassium: 4.5 mmol/L (ref 3.5–5.1)
Sodium: 139 mmol/L (ref 135–145)
Total Bilirubin: 0.6 mg/dL (ref 0.0–1.2)
Total Protein: 7.2 g/dL (ref 6.5–8.1)

## 2024-07-16 LAB — SAMPLE TO BLOOD BANK

## 2024-07-24 ENCOUNTER — Inpatient Hospital Stay: Attending: Internal Medicine

## 2024-07-24 DIAGNOSIS — J9811 Atelectasis: Secondary | ICD-10-CM | POA: Insufficient documentation

## 2024-07-24 DIAGNOSIS — Z8701 Personal history of pneumonia (recurrent): Secondary | ICD-10-CM | POA: Diagnosis not present

## 2024-07-24 DIAGNOSIS — H548 Legal blindness, as defined in USA: Secondary | ICD-10-CM | POA: Insufficient documentation

## 2024-07-24 DIAGNOSIS — Z5111 Encounter for antineoplastic chemotherapy: Secondary | ICD-10-CM | POA: Insufficient documentation

## 2024-07-24 DIAGNOSIS — I700 Atherosclerosis of aorta: Secondary | ICD-10-CM | POA: Insufficient documentation

## 2024-07-24 DIAGNOSIS — K900 Celiac disease: Secondary | ICD-10-CM | POA: Diagnosis not present

## 2024-07-24 DIAGNOSIS — N200 Calculus of kidney: Secondary | ICD-10-CM | POA: Insufficient documentation

## 2024-07-24 DIAGNOSIS — J432 Centrilobular emphysema: Secondary | ICD-10-CM | POA: Diagnosis not present

## 2024-07-24 DIAGNOSIS — M8580 Other specified disorders of bone density and structure, unspecified site: Secondary | ICD-10-CM | POA: Insufficient documentation

## 2024-07-24 DIAGNOSIS — H5789 Other specified disorders of eye and adnexa: Secondary | ICD-10-CM | POA: Insufficient documentation

## 2024-07-24 DIAGNOSIS — I739 Peripheral vascular disease, unspecified: Secondary | ICD-10-CM | POA: Diagnosis not present

## 2024-07-24 DIAGNOSIS — N1832 Chronic kidney disease, stage 3b: Secondary | ICD-10-CM | POA: Insufficient documentation

## 2024-07-24 DIAGNOSIS — T7840XA Allergy, unspecified, initial encounter: Secondary | ICD-10-CM | POA: Diagnosis not present

## 2024-07-24 DIAGNOSIS — R634 Abnormal weight loss: Secondary | ICD-10-CM | POA: Diagnosis not present

## 2024-07-24 DIAGNOSIS — Z860102 Personal history of hyperplastic colon polyps: Secondary | ICD-10-CM | POA: Diagnosis not present

## 2024-07-24 DIAGNOSIS — Z79899 Other long term (current) drug therapy: Secondary | ICD-10-CM | POA: Insufficient documentation

## 2024-07-24 DIAGNOSIS — Z9841 Cataract extraction status, right eye: Secondary | ICD-10-CM | POA: Insufficient documentation

## 2024-07-24 DIAGNOSIS — I5022 Chronic systolic (congestive) heart failure: Secondary | ICD-10-CM | POA: Diagnosis not present

## 2024-07-24 DIAGNOSIS — Z9842 Cataract extraction status, left eye: Secondary | ICD-10-CM | POA: Insufficient documentation

## 2024-07-24 DIAGNOSIS — C3412 Malignant neoplasm of upper lobe, left bronchus or lung: Secondary | ICD-10-CM | POA: Insufficient documentation

## 2024-07-24 DIAGNOSIS — I708 Atherosclerosis of other arteries: Secondary | ICD-10-CM | POA: Diagnosis not present

## 2024-07-24 DIAGNOSIS — M479 Spondylosis, unspecified: Secondary | ICD-10-CM | POA: Insufficient documentation

## 2024-07-24 DIAGNOSIS — Z923 Personal history of irradiation: Secondary | ICD-10-CM | POA: Insufficient documentation

## 2024-07-24 DIAGNOSIS — Z7901 Long term (current) use of anticoagulants: Secondary | ICD-10-CM | POA: Diagnosis not present

## 2024-07-24 DIAGNOSIS — Z79630 Long term (current) use of alkylating agent: Secondary | ICD-10-CM | POA: Insufficient documentation

## 2024-07-24 DIAGNOSIS — Z9226 Personal history of immune checkpoint inhibitor therapy: Secondary | ICD-10-CM | POA: Insufficient documentation

## 2024-07-24 DIAGNOSIS — N281 Cyst of kidney, acquired: Secondary | ICD-10-CM | POA: Diagnosis not present

## 2024-07-24 DIAGNOSIS — I252 Old myocardial infarction: Secondary | ICD-10-CM | POA: Insufficient documentation

## 2024-07-24 DIAGNOSIS — Z9221 Personal history of antineoplastic chemotherapy: Secondary | ICD-10-CM | POA: Insufficient documentation

## 2024-07-24 DIAGNOSIS — I130 Hypertensive heart and chronic kidney disease with heart failure and stage 1 through stage 4 chronic kidney disease, or unspecified chronic kidney disease: Secondary | ICD-10-CM | POA: Diagnosis not present

## 2024-07-24 DIAGNOSIS — Z79631 Long term (current) use of antimetabolite agent: Secondary | ICD-10-CM | POA: Insufficient documentation

## 2024-07-24 DIAGNOSIS — Z87442 Personal history of urinary calculi: Secondary | ICD-10-CM | POA: Insufficient documentation

## 2024-07-24 LAB — CMP (CANCER CENTER ONLY)
ALT: 7 U/L (ref 0–44)
AST: 13 U/L — ABNORMAL LOW (ref 15–41)
Albumin: 3.5 g/dL (ref 3.5–5.0)
Alkaline Phosphatase: 62 U/L (ref 38–126)
Anion gap: 7 (ref 5–15)
BUN: 24 mg/dL — ABNORMAL HIGH (ref 8–23)
CO2: 26 mmol/L (ref 22–32)
Calcium: 9.6 mg/dL (ref 8.9–10.3)
Chloride: 104 mmol/L (ref 98–111)
Creatinine: 1.46 mg/dL — ABNORMAL HIGH (ref 0.61–1.24)
GFR, Estimated: 47 mL/min — ABNORMAL LOW (ref >60)
Glucose, Bld: 76 mg/dL (ref 70–99)
Potassium: 4.3 mmol/L (ref 3.5–5.1)
Sodium: 137 mmol/L (ref 135–145)
Total Bilirubin: 0.6 mg/dL (ref 0.0–1.2)
Total Protein: 7.6 g/dL (ref 6.5–8.1)

## 2024-07-24 LAB — CBC WITH DIFFERENTIAL (CANCER CENTER ONLY)
Abs Immature Granulocytes: 0.02 K/uL (ref 0.00–0.07)
Basophils Absolute: 0 K/uL (ref 0.0–0.1)
Basophils Relative: 1 %
Eosinophils Absolute: 0.1 K/uL (ref 0.0–0.5)
Eosinophils Relative: 2 %
HCT: 34.3 % — ABNORMAL LOW (ref 39.0–52.0)
Hemoglobin: 11.1 g/dL — ABNORMAL LOW (ref 13.0–17.0)
Immature Granulocytes: 0 %
Lymphocytes Relative: 20 %
Lymphs Abs: 1 K/uL (ref 0.7–4.0)
MCH: 28.8 pg (ref 26.0–34.0)
MCHC: 32.4 g/dL (ref 30.0–36.0)
MCV: 89.1 fL (ref 80.0–100.0)
Monocytes Absolute: 0.9 K/uL (ref 0.1–1.0)
Monocytes Relative: 16 %
Neutro Abs: 3.2 K/uL (ref 1.7–7.7)
Neutrophils Relative %: 61 %
Platelet Count: 213 K/uL (ref 150–400)
RBC: 3.85 MIL/uL — ABNORMAL LOW (ref 4.22–5.81)
RDW: 13.9 % (ref 11.5–15.5)
WBC Count: 5.3 K/uL (ref 4.0–10.5)
nRBC: 0 % (ref 0.0–0.2)

## 2024-07-24 NOTE — Progress Notes (Unsigned)
 [facility] OFFICE PROGRESS NOTE  Rodney Aland, MD [address], [address] [place] [address]  DIAGNOSIS: Stage IIIC/IV (T3, N3, M0) non-small cell lung cancer, adenocarcinoma. He presented with a left upper lobe spiculated mass in addition to lingular lesion and suspicious mediastinal lymphadenopathy He also has bilateral hypermetabolic axillary lymph nodes which could be related to metastatic disease versus inflammatory as the patient did have vaccines in both of his arms the week prior to his PET scan.  This was diagnosed in August 2024.   Molecular Studies: His PD-L1 expression was 1% and he has no actionable mutations.  PRIOR THERAPY: 1) Concurrent chemoradiation with carboplatin  for an AUC of 2 and paclitaxel  45 mg/m.  First dose expected on 08/15/23.  Status post 5 cycles of first cycle was given with carboplatin  and paclitaxel  and starting from cycle #2 he was on carboplatin  and Abraxane  secondary to hypersensitivity reaction to paclitaxel . 2) Consolidation immunotherapy with Imfinzi  1500 Mg IV every 4 weeks.  First dose November 28, 2023.  Status post 2 cycles.  Discontinued secondary to intolerance and patient is requested.  CURRENT THERAPY:  First-line systemic chemotherapy with carboplatin  for AUC of 4, Alimta  400 Mg/M2 and Keytruda  200 Mg IV every 3 weeks.  First dose June 20, 2024.  Starting cycle #2 Keytruda  will be discontinued secondary to intolerance with significant itching and patient's preference. He is status post 1 cycle.   INTERVAL HISTORY: [name] 83 y.o. male returns to the clinic today for follow-up visit.  The patient was last seen in the clinic by myself and Dr. Sherrod on 07/16/2024.  In summary the patient is currently undergoing chemotherapy.  He is status post 1 cycle.  At his last appointment he was not feeling up for treatment that day.  Therefore his treatment was deferred by 1 week.  His symptoms of nausea, ongoing chest discomfort, rash,  itching, and ***is ***at this time.  Today he denies any fever, chills, or night sweats.  Appetite and weight loss?  Itching?  Chest discomfort?  Tylenol ?  He has shortness of breath that comes and goes.  He uses albuterol  once a day.  He denies frequent coughing but has occasional episodes.  He denies any hemoptysis.  Rash on his back?  Nausea?  He denies any diarrhea or constipation.  He denies any vomiting.  Eyes?  Red?  He denies any headache or visual changes.  He is here today for evaluation and repeat blood work before considering starting cycle #2. MEDICAL HISTORY: Past Medical History:  Diagnosis Date   AICD (automatic cardioverter/defibrillator) present    CAD (coronary artery disease) 80% stenosis diag of the LAD, 30% in OM2 branch of LCX in 2009    a. Nonobstructive CAD by cath 11/2011 with the exception of the pre-existing diagonal branch #2 lesion.   Chronic systolic CHF (congestive heart failure) (HCC)    CKD (chronic kidney disease) stage 3, GFR 30-59 ml/min (HCC)    Colon polyp, hyperplastic    History of kidney stones    History of radiation therapy    Left Lung- 08/17/23-09/29/23- Dr. Lynwood Nasuti   History of stress test 06/01/2012   Normal myocardial perfusion study. compared to the previous study there is no significant change. this is a low risk scan   Hypertension    Legally blind    both eyes   Myocardial infarction [facility]) 11/22/2011   NICM (nonischemic cardiomyopathy) (HCC)    a. Remote hx of dilated  NICM with EF ranging 20-45%, including normal EF by echo (55-60%) in 2014.   Peripheral arterial disease (HCC)    a. 06/2014: ABI right 0.99, left 1.2, LE dopplers revealing an occluded right posterior tibial. As symptoms were not felt r/t claudication, no further w/u at the time.   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 Requiring discontinuation of BB dose.   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  MEDICATIONS:  Current Outpatient Medications  Medication Sig Dispense Refill   apixaban  (ELIQUIS ) 2.5 MG TABS tablet Take 1 tablet (2.5 mg total) by mouth 2 (two) times daily. 180 tablet 1   BREO ELLIPTA  100-25 MCG/INH AEPB Inhale 1 puff into the lungs every morning.     brimonidine  (ALPHAGAN ) 0.2 % ophthalmic solution Place 1 drop into both eyes 2 (two) times daily.     cephALEXin  (KEFLEX ) 500 MG capsule Take 1 capsule (500 mg total) by mouth 2 (two) times daily. 28 capsule 0   cycloSPORINE  (RESTASIS ) 0.05 % ophthalmic emulsion Place 1 drop into both eyes 2 (two) times daily.     dorzolamide -timolol  (COSOPT ) 22.3-6.8 MG/ML ophthalmic solution Place 1 drop into both eyes 2 (two) times daily.     doxycycline  (VIBRAMYCIN ) 100 MG capsule Take 1 capsule (100 mg total) by mouth 2 (two) times daily. 20 capsule 0   ENTRESTO  24-26 MG TAKE 1 TABLET BY MOUTH TWICE DAILY 180 tablet 3   folic acid  (FOLVITE ) 1 MG tablet Take 1 tablet (1 mg total) by mouth daily. Start 7 days before pemetrexed  chemotherapy. Continue until 21 days after pemetrexed  completed. 100 tablet 3   furosemide  (LASIX ) 20 MG tablet Take 0.5 tablets (10 mg total) by mouth daily. 30 tablet 1   Glucagon , rDNA, (GLUCAGON  EMERGENCY) 1 MG KIT Inject 1 mg into the skin as needed for up to 2 doses (Severe low blood sugar). 1 kit 0   hydrOXYzine  (ATARAX ) 25 MG tablet Take 1 tablet (25 mg total) by mouth every 6 (six) hours as needed for itching. 12 tablet 0   insulin  isophane & regular human KwikPen (HUMULIN 70/30 MIX) (70-30) 100 UNIT/ML KwikPen Inject 25 Units into the skin in the morning and at bedtime. 15 mL 0   isosorbide  mononitrate (IMDUR ) 30 MG 24 hr tablet Take 30 mg by mouth daily.     lidocaine  (LIDODERM ) 5 % Place 1 patch onto the skin daily. Remove & Discard patch  within 12 hours or as directed by MD 30 patch 0   metoprolol  succinate (TOPROL -XL) 25 MG 24 hr tablet Take 0.5 tablets (12.5 mg total) by mouth daily. 15 tablet 1   ondansetron  (ZOFRAN ) 8 MG tablet Take 1 tablet (8 mg total) by mouth every 8 (eight) hours as needed for nausea or vomiting. Start on the third day after carboplatin . 30 tablet 1   ROCKLATAN  0.02-0.005 % SOLN Place 1 drop into both eyes at bedtime.     spironolactone  (ALDACTONE ) 25 MG tablet Take 12.5 mg by mouth daily.     No current facility-administered medications for this visit.    SURGICAL HISTORY:  Past Surgical History:  Procedure Laterality Date   BACK SURGERY     BIV UPGRADE N/A 11/08/2019   Procedure: BIV UPGRADE;  Surgeon: Waddell Danelle ORN, MD;  Location: [facility]  INVASIVE CV LAB;  Service: Cardiovascular;  Laterality: N/A;   BRONCHIAL BIOPSY  07/05/2023   Procedure: BRONCHIAL BIOPSIES;  Surgeon: Shelah Lamar RAMAN, MD;  Location: [facility] ENDOSCOPY;  Service: Pulmonary;;   BRONCHIAL BRUSHINGS  07/05/2023   Procedure: BRONCHIAL BRUSHINGS;  Surgeon: Shelah Lamar RAMAN, MD;  Location: [facility] ENDOSCOPY;  Service: Pulmonary;;   BRONCHIAL NEEDLE ASPIRATION BIOPSY  07/05/2023   Procedure: BRONCHIAL NEEDLE ASPIRATION BIOPSIES;  Surgeon: Shelah Lamar RAMAN, MD;  Location: [facility] ENDOSCOPY;  Service: Pulmonary;;   BRONCHIAL WASHINGS  07/05/2023   Procedure: BRONCHIAL WASHINGS;  Surgeon: Shelah Lamar RAMAN, MD;  Location: [facility] ENDOSCOPY;  Service: Pulmonary;;   CARDIAC CATHETERIZATION  11/2011   CARDIAC CATHETERIZATION  11/2011   didn't demonstrate high grade obstructive disease to account for his LV dysfunction.   CATARACT EXTRACTION, BILATERAL  1990's   CYSTOSCOPY     CYSTOSCOPY WITH BIOPSY Bilateral 08/03/2021   Procedure: CYSTOSCOPY WITH BLADDER BIOPSY, BILATERAL RETROGRADE PYELOGRAM;  Surgeon: Carolee Sherwood JONETTA DOUGLAS, MD;  Location: [facility] ORS;  Service: Urology;  Laterality: Bilateral;  REQUESTING 45 MINS   CYSTOSCOPY WITH URETHRAL DILATATION N/A 05/04/2013    Procedure: CYSTOSCOPY WITH URETHRAL DILATATION;  Surgeon: Alm RAMAN Molt, MD;  Location: [facility] NEURO ORS;  Service: Neurosurgery;  Laterality: N/A;  with insertion of foley catheter   EP study and ablation of VT  7/13   PVC focus mapped to the right coronary cusp of the aorta, limited ablation performed due to proximity of the focus to the right coronary artery   EYE SURGERY     FINE NEEDLE ASPIRATION  07/05/2023   Procedure: FINE NEEDLE ASPIRATION (FNA) LINEAR;  Surgeon: Shelah Lamar RAMAN, MD;  Location: [facility] ENDOSCOPY;  Service: Pulmonary;;   LEFT HEART CATH AND CORONARY ANGIOGRAPHY N/A 11/07/2019   Procedure: LEFT HEART CATH AND CORONARY ANGIOGRAPHY;  Surgeon: Darron Deatrice LABOR, MD;  Location: [facility] INVASIVE CV LAB;  Service: Cardiovascular;  Laterality: N/A;   LEFT HEART CATHETERIZATION WITH CORONARY ANGIOGRAM N/A 11/25/2011   Procedure: LEFT HEART CATHETERIZATION WITH CORONARY ANGIOGRAM;  Surgeon: Alm LELON Clay, MD;  Location: [facility] CATH LAB;  Service: Cardiovascular;  Laterality: N/A;   PACEMAKER IMPLANT N/A 07/12/2018   Procedure: PACEMAKER IMPLANT;  Surgeon: Francyne Headland, MD;  Location: [facility] INVASIVE CV LAB;  Service: Cardiovascular;  Laterality: N/A;   POSTERIOR FUSION LUMBAR SPINE  1979   ROTATOR CUFF REPAIR  2000's   left   V-TACH ABLATION N/A 06/06/2012   Procedure: V-TACH ABLATION;  Surgeon: Lynwood Rakers, MD;  Location: [facility] CATH LAB;  Service: Cardiovascular;  Laterality: N/A;   VIDEO BRONCHOSCOPY WITH ENDOBRONCHIAL ULTRASOUND N/A 07/05/2023   Procedure: VIDEO BRONCHOSCOPY WITH ENDOBRONCHIAL ULTRASOUND;  Surgeon: Shelah Lamar RAMAN, MD;  Location: [facility] ENDOSCOPY;  Service: Pulmonary;  Laterality: N/A;    REVIEW OF SYSTEMS:   Review of Systems  Constitutional: Negative for appetite change, chills, fatigue, fever and unexpected weight change.  HENT:   Negative for mouth sores, nosebleeds, sore throat and trouble swallowing.   Eyes: Negative for eye problems and icterus.  Respiratory: Negative for  cough, hemoptysis, shortness of breath and wheezing.   Cardiovascular: Negative for chest pain and leg swelling.  Gastrointestinal: Negative for abdominal pain, constipation, diarrhea, nausea and vomiting.  Genitourinary: Negative for bladder incontinence, difficulty urinating, dysuria, frequency and hematuria.   Musculoskeletal: Negative for back pain, gait problem, neck pain and neck stiffness.  Skin: Negative for itching and rash.  Neurological: Negative for dizziness, extremity weakness, gait problem, headaches, light-headedness and seizures.  Hematological: Negative for adenopathy. Does not bruise/bleed easily.  Psychiatric/Behavioral: Negative for confusion, depression and sleep disturbance. The patient is not nervous/anxious.     PHYSICAL EXAMINATION:  There were no vitals taken for this visit.  ECOG PERFORMANCE STATUS: {CHL ONC ECOG [id number]  Physical Exam  Constitutional: Oriented to person, place, and time and well-developed, well-nourished, and in no distress. No distress.  HENT:  Head: Normocephalic and atraumatic.  Mouth/Throat: Oropharynx is clear and moist. No oropharyngeal exudate.  Eyes: Conjunctivae are normal. Right eye exhibits no discharge. Left eye exhibits no discharge. No scleral icterus.  Neck: Normal range of motion. Neck supple.  Cardiovascular: Normal rate, regular rhythm, normal heart sounds and intact distal pulses.   Pulmonary/Chest: Effort normal and breath sounds normal. No respiratory distress. No wheezes. No rales.  Abdominal: Soft. Bowel sounds are normal. Exhibits no distension and no mass. There is no tenderness.  Musculoskeletal: Normal range of motion. Exhibits no edema.  Lymphadenopathy:    No cervical adenopathy.  Neurological: Alert and oriented to person, place, and time. Exhibits normal muscle tone. Gait normal. Coordination normal.  Skin: Skin is warm and dry. No rash noted. Not diaphoretic. No erythema. No pallor.  Psychiatric: Mood,  memory and judgment normal.  Vitals reviewed.  LABORATORY DATA: Lab Results  Component Value Date   WBC 5.3 07/24/2024   HGB 11.1 (L) 07/24/2024   HCT 34.3 (L) 07/24/2024   MCV 89.1 07/24/2024   PLT 213 07/24/2024      Chemistry      Component Value Date/Time   NA 137 07/24/2024 1511   NA 142 11/11/2022 1000   K 4.3 07/24/2024 1511   CL 104 07/24/2024 1511   CO2 26 07/24/2024 1511   BUN 24 (H) 07/24/2024 1511   BUN 18 11/11/2022 1000   CREATININE 1.46 (H) 07/24/2024 1511   CREATININE 1.37 (H) 01/24/2017 0950      Component Value Date/Time   CALCIUM  9.6 07/24/2024 1511   ALKPHOS 62 07/24/2024 1511   AST 13 (L) 07/24/2024 1511   ALT 7 07/24/2024 1511   BILITOT 0.6 07/24/2024 1511       RADIOGRAPHIC STUDIES:  CT Head Wo Contrast Result Date: 07/12/2024 CLINICAL DATA:  Neuro deficit, acute, stroke suspected. Difficulty with speech. EXAM: CT HEAD WITHOUT CONTRAST TECHNIQUE: Contiguous axial images were obtained from the base of the skull through the vertex without intravenous contrast. RADIATION DOSE REDUCTION: This exam was performed according to the departmental dose-optimization program which includes automated exposure control, adjustment of the mA and/or kV according to patient size and/or use of iterative reconstruction technique. COMPARISON:  02/23/2024 FINDINGS: Brain: Diffuse cerebral atrophy. No acute intracranial abnormality. Specifically, no hemorrhage, hydrocephalus, mass lesion, acute infarction, or significant intracranial injury. Vascular: No hyperdense vessel or unexpected calcification. Skull: No acute calvarial abnormality. Sinuses/Orbits: No acute findings Other: None IMPRESSION: Diffuse cerebral atrophy. No acute intracranial abnormality. Electronically Signed   By: Franky Crease M.D.   On: 07/12/2024 19:37   CT Angio Chest/Abd/Pel for Dissection W and/or Wo Contrast Result Date: 07/12/2024 CLINICAL DATA:  Acute aortic syndrome suspected. EXAM: CT ANGIOGRAPHY  CHEST, ABDOMEN AND PELVIS TECHNIQUE: Non-contrast CT of the chest was initially obtained. Multidetector CT imaging through the chest, abdomen and pelvis was performed using the standard protocol during bolus administration of intravenous contrast. Multiplanar reconstructed images and MIPs were obtained and reviewed to evaluate the vascular anatomy. RADIATION DOSE REDUCTION: This exam was performed according to the departmental dose-optimization program which includes automated exposure  control, adjustment of the mA and/or kV according to patient size and/or use of iterative reconstruction technique. CONTRAST:  75mL OMNIPAQUE  IOHEXOL  350 MG/ML SOLN COMPARISON:  Chest CT dated 05/31/2024. FINDINGS: Evaluation is limited due to streak artifact caused by spinal hardware. CTA CHEST FINDINGS Cardiovascular: There is no cardiomegaly or pericardial effusion. There is coronary vascular calcification. Left pectoral pacemaker device. Mild atherosclerotic calcification of the thoracic aorta. No aneurysmal dilatation or dissection. The origins of the great vessels of the aortic arch are patent. No pulmonary artery embolus identified. Mediastinum/Nodes: No obvious hilar adenopathy. Subcarinal lymph node measures 14 mm short axis. The esophagus is grossly unremarkable. No mediastinal fluid collection. Lungs/Pleura: Small left pleural effusion. There is partial compressive atelectasis of the left lower lobe. An area of consolidation in the lingula with air bronchogram most consistent with pneumonia. Underlying mass is not excluded. Follow-up to resolution recommended. Background of mild centrilobular emphysema. No pneumothorax. The central airways are patent. Musculoskeletal: Osteopenia with degenerative changes. Lower thoracic and lumbar fusion hardware. No acute osseous pathology. Review of the MIP images confirms the above findings. CTA ABDOMEN AND PELVIS FINDINGS VASCULAR Aorta: Moderate atherosclerotic calcification. Nodular  dilatation or dissection. Celiac: Atherosclerotic calcification of the origin of the celiac artery. The celiac artery and its major branches are patent. SMA: Atherosclerotic calcification of the origin of the SMA. The SMA is patent. Renals: The renal arteries are patent. IMA: The IMA is patent. Inflow: Moderate atherosclerotic calcification of the iliac arteries. No aneurysmal dilatation or dissection. The iliac arteries are patent. Veins: No obvious venous abnormality within the limitations of this arterial phase study. Review of the MIP images confirms the above findings. NON-VASCULAR No intra-abdominal free air or free fluid. Hepatobiliary: The liver is unremarkable.  Cholecystectomy. Pancreas: The pancreas is unremarkable as visualized. Spleen: A 1 cm rim calcified lesion in the superior pole of the spleen is not characterized but likely sequela of prior insult. Adrenals/Urinary Tract: The adrenal glands unremarkable. Bilateral renal cysts poorly evaluated on this CT due to severe streak artifact. There is a 4 mm nonobstructing right renal upper pole calculus and a 3 mm nonobstructing right renal interpolar stone. There is no hydronephrosis on either side. The visualized ureters and urinary bladder appear unremarkable. Stomach/Bowel: There is no bowel obstruction or active inflammation. The appendix is normal. Lymphatic: No adenopathy. Reproductive: The prostate and seminal vesicles are grossly unremarkable. Other: Midline vertical anterior abdominal wall incisional scar. Musculoskeletal: Osteopenia with degenerative changes of the spine. Lower lumbar laminectomy and posterior fusion hardware. No acute osseous pathology. Review of the MIP images confirms the above findings. IMPRESSION: 1. No aortic aneurysm or dissection. 2. Lingular pneumonia. Follow-up to resolution recommended. 3. Small left pleural effusion with partial compressive atelectasis of the left lower lobe. 4. No acute intra-abdominal or pelvic  pathology. 5. Nonobstructing right renal calculi. No hydronephrosis. 6. Aortic Atherosclerosis (ICD10-I70.0) and Emphysema (ICD10-J43.9). Electronically Signed   By: Vanetta Chou M.D.   On: 07/12/2024 15:58   DG Chest 2 View Result Date: 07/12/2024 CLINICAL DATA:  Chest pain EXAM: CHEST - 2 VIEW COMPARISON:  06/09/2024 FINDINGS: Cardiomediastinal silhouette and pulmonary vasculature are within normal limits. Postsurgical changes of the LEFT shoulder, thoracolumbar spine, and AICD are again seen. 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 RIGHT lung is well aerated. Small LEFT pleural effusion appears slightly larger since prior exam. IMPRESSION: 1. Small LEFT pleural effusion appears slightly larger since prior exam. 2. Unchanged LEFT lung  opacities consistent with known malignancy. Electronically Signed   By: Aliene Lloyd M.D.   On: 07/12/2024 13:46     ASSESSMENT/PLAN:  This is a very pleasant 83 year old African-American male with suspicious stage IIIC/IV (T3, N3, M0) non-small cell lung cancer, adenocarcinoma. He presented with a left upper lobe lung mass and lesion in the lingula.  He also has bilateral mediastinal lymph nodes and hypermetabolic bilateral axillary lymph nodes.  Although it is unclear if the axillary lymph nodes are related to metastatic disease or inflammatory as he did have bilateral vaccines in his upper arms just prior to his PET scan and his axillary biopsy was negative. He was He was diagnosed in August 2024.    His molecular studies show no actionable mutations and his PD-L1 expression is 1%   He started a course of concurrent chemoradiation initially with carboplatin  for AUC of 2 and paclitaxel  45 Mg/M2 but paclitaxel  was the changed to Abraxane  starting from cycle #2 secondary to hypersensitivity reaction.  The patient received 5 cycles of concurrent chemoradiation.  He tolerated this treatment fairly well except for fatigue.    The patient then underwent consolidation immunotherapy with Imfinzi  1500 mg IV every 4 weeks.  He status post 2 cycles.  He developed significant itching and rash he decided to stop treatment at that time and the last dose was given in February 2025.   In July 2025 he started having evidence of disease recurrence in July 2025.  Therefore Dr. Sherrod started him on palliative systemic chemotherapy with carboplatin  for an AUC of 5 and Alimta  500 mg/m.  He is status post 1 cycle.  The patient refused Keytruda  due to prior intolerance with immunotherapy.    Labs were reviewed.  Recommend that he ***cycle #2 today scheduled. ***visit doses   The patient declined IV fluids and antiemetics.  His nausea is controlled with his antiemetics.  Continue to monitor his labs closely on a weekly basis.   He will continue to use hydrocortisone  cream and antihistamines for rash.  Given his age he may want to consider switching to Claritin  or Zyrtec from Atarax .   The patient has erythema of his eyes bilaterally.  He will continue with the eyedrops prescribed by his eye doctor.  If seeing symptoms he is advised to follow-up with his eye doctor.   The patient was advised to call immediately if he has any concerning symptoms in the interval. The patient voices understanding of current disease status and treatment options and is in agreement with the current care plan. All questions were answered. The patient knows to call the clinic with any problems, questions or concerns. We can certainly see the patient much sooner if necessary     No orders of the defined types were placed in this encounter.    I spent {CHL ONC TIME VISIT - DTPQU:[id number]} counseling the patient face to face. The total time spent in the appointment was {CHL ONC TIME VISIT - DTPQU:[id number]}.  Virgil Slinger L Korinne Greenstein, PA-C 07/24/24

## 2024-07-25 ENCOUNTER — Ambulatory Visit (HOSPITAL_BASED_OUTPATIENT_CLINIC_OR_DEPARTMENT_OTHER): Admitting: Physician Assistant

## 2024-07-25 ENCOUNTER — Inpatient Hospital Stay

## 2024-07-25 VITALS — BP 120/63 | HR 73 | Temp 97.9°F | Resp 14 | Wt 148.2 lb

## 2024-07-25 DIAGNOSIS — C3412 Malignant neoplasm of upper lobe, left bronchus or lung: Secondary | ICD-10-CM

## 2024-07-25 DIAGNOSIS — K219 Gastro-esophageal reflux disease without esophagitis: Secondary | ICD-10-CM | POA: Diagnosis not present

## 2024-07-25 DIAGNOSIS — L299 Pruritus, unspecified: Secondary | ICD-10-CM

## 2024-07-25 DIAGNOSIS — Z5111 Encounter for antineoplastic chemotherapy: Secondary | ICD-10-CM | POA: Diagnosis not present

## 2024-07-25 DIAGNOSIS — R7989 Other specified abnormal findings of blood chemistry: Secondary | ICD-10-CM

## 2024-07-25 MED ORDER — SODIUM CHLORIDE 0.9 % IV SOLN
150.0000 mg | Freq: Once | INTRAVENOUS | Status: AC
  Administered 2024-07-25: 150 mg via INTRAVENOUS
  Filled 2024-07-25: qty 150

## 2024-07-25 MED ORDER — SODIUM CHLORIDE 0.9 % IV SOLN
242.4000 mg | Freq: Once | INTRAVENOUS | Status: AC
  Administered 2024-07-25: 240 mg via INTRAVENOUS
  Filled 2024-07-25: qty 24

## 2024-07-25 MED ORDER — OMEPRAZOLE 20 MG PO CPDR: 20.0000 mg | DELAYED_RELEASE_CAPSULE | Freq: Every day | ORAL | 2 refills | Status: DC

## 2024-07-25 MED ORDER — DEXAMETHASONE SODIUM PHOSPHATE 10 MG/ML IJ SOLN
10.0000 mg | Freq: Once | INTRAMUSCULAR | Status: AC
  Administered 2024-07-25: 10 mg via INTRAVENOUS
  Filled 2024-07-25: qty 1

## 2024-07-25 MED ORDER — SODIUM CHLORIDE 0.9 % IV SOLN
INTRAVENOUS | Status: DC
Start: 2024-07-25 — End: 2024-07-25

## 2024-07-25 MED ORDER — SODIUM CHLORIDE 0.9 % IV SOLN: Freq: Once | INTRAVENOUS | Status: AC

## 2024-07-25 MED ORDER — HYDROCORTISONE 1 % EX LOTN
1.0000 | TOPICAL_LOTION | Freq: Two times a day (BID) | CUTANEOUS | 0 refills | Status: AC
Start: 2024-07-25 — End: ?

## 2024-07-25 MED ORDER — PALONOSETRON HCL INJECTION 0.25 MG/5ML
0.2500 mg | Freq: Once | INTRAVENOUS | Status: AC
  Administered 2024-07-25: 0.25 mg via INTRAVENOUS
  Filled 2024-07-25: qty 5

## 2024-07-25 MED ORDER — SODIUM CHLORIDE 0.9 % IV SOLN
400.0000 mg/m2 | Freq: Once | INTRAVENOUS | Status: AC
  Administered 2024-07-25: 700 mg via INTRAVENOUS
  Filled 2024-07-25: qty 20

## 2024-07-25 NOTE — Patient Instructions (Signed)
 [facility] - A DEPT OF [facility]  Discharge Instructions: Thank you for choosing [facility] to provide your oncology and hematology care.   If you have a lab appointment with the Cancer Center, please go directly to the Cancer Center and check in at the registration area.   Wear comfortable clothing and clothing appropriate for easy access to any Portacath or PICC line.   We strive to give you quality time with your provider. You may need to reschedule your appointment if you arrive late (15 or more minutes).  Arriving late affects you and other patients whose appointments are after yours.  Also, if you miss three or more appointments without notifying the office, you may be dismissed from the clinic at the provider's discretion.      For prescription refill requests, have your pharmacy contact our office and allow 72 hours for refills to be completed.    Today you received the following chemotherapy and/or immunotherapy agents :  Pemetrexed  & Carboplatin       To help prevent nausea and vomiting after your treatment, we encourage you to take your nausea medication as directed.  BELOW ARE SYMPTOMS THAT SHOULD BE REPORTED IMMEDIATELY: *FEVER GREATER THAN 100.4 F (38 C) OR HIGHER *CHILLS OR SWEATING *NAUSEA AND VOMITING THAT IS NOT CONTROLLED WITH YOUR NAUSEA MEDICATION *UNUSUAL SHORTNESS OF BREATH *UNUSUAL BRUISING OR BLEEDING *URINARY PROBLEMS (pain or burning when urinating, or frequent urination) *BOWEL PROBLEMS (unusual diarrhea, constipation, pain near the anus) TENDERNESS IN MOUTH AND THROAT WITH OR WITHOUT PRESENCE OF ULCERS (sore throat, sores in mouth, or a toothache) UNUSUAL RASH, SWELLING OR PAIN  UNUSUAL VAGINAL DISCHARGE OR ITCHING   Items with * indicate a potential emergency and should be followed up as soon as possible or go to the Emergency Department if any problems should occur.  Please show the CHEMOTHERAPY ALERT CARD  or IMMUNOTHERAPY ALERT CARD at check-in to the Emergency Department and triage nurse.  Should you have questions after your visit or need to cancel or reschedule your appointment, please contact [facility] - A DEPT OF [facility]  Dept: [phone]  and follow the prompts.  Office hours are 8:00 a.m. to 4:30 p.m. Monday - Friday. Please note that voicemails left after 4:00 p.m. may not be returned until the following business day.  We are closed weekends and major holidays. You have access to a nurse at all times for urgent questions. Please call the main number to the clinic Dept: [phone] and follow the prompts.   For any non-urgent questions, you may also contact your provider using MyChart. We now offer e-Visits for anyone 43 and older to request care online for non-urgent symptoms. For details visit [email or web address].   Also download the MyChart app! Go to the app store, search MyChart, open the app, select [place], and log in with your MyChart username and password.

## 2024-08-01 ENCOUNTER — Telehealth: Payer: Self-pay | Admitting: *Deleted

## 2024-08-01 ENCOUNTER — Other Ambulatory Visit: Payer: Self-pay | Admitting: Physician Assistant

## 2024-08-01 ENCOUNTER — Telehealth: Payer: Self-pay | Admitting: Internal Medicine

## 2024-08-01 ENCOUNTER — Inpatient Hospital Stay

## 2024-08-01 DIAGNOSIS — C3412 Malignant neoplasm of upper lobe, left bronchus or lung: Secondary | ICD-10-CM

## 2024-08-01 DIAGNOSIS — Z5111 Encounter for antineoplastic chemotherapy: Secondary | ICD-10-CM | POA: Diagnosis not present

## 2024-08-01 LAB — CBC WITH DIFFERENTIAL (CANCER CENTER ONLY)
Abs Immature Granulocytes: 0.01 K/uL (ref 0.00–0.07)
Basophils Absolute: 0 K/uL (ref 0.0–0.1)
Basophils Relative: 2 %
Eosinophils Absolute: 0.1 K/uL (ref 0.0–0.5)
Eosinophils Relative: 8 %
HCT: 28.6 % — ABNORMAL LOW (ref 39.0–52.0)
Hemoglobin: 9.5 g/dL — ABNORMAL LOW (ref 13.0–17.0)
Immature Granulocytes: 1 %
Lymphocytes Relative: 50 %
Lymphs Abs: 0.4 K/uL — ABNORMAL LOW (ref 0.7–4.0)
MCH: 29.4 pg (ref 26.0–34.0)
MCHC: 33.2 g/dL (ref 30.0–36.0)
MCV: 88.5 fL (ref 80.0–100.0)
Monocytes Absolute: 0.1 K/uL (ref 0.1–1.0)
Monocytes Relative: 8 %
Neutro Abs: 0.3 K/uL — CL (ref 1.7–7.7)
Neutrophils Relative %: 31 %
Platelet Count: 104 K/uL — ABNORMAL LOW (ref 150–400)
RBC: 3.23 MIL/uL — ABNORMAL LOW (ref 4.22–5.81)
RDW: 13.9 % (ref 11.5–15.5)
Smear Review: NORMAL
WBC Count: 0.9 K/uL — CL (ref 4.0–10.5)
nRBC: 0 % (ref 0.0–0.2)

## 2024-08-01 LAB — CMP (CANCER CENTER ONLY)
ALT: 8 U/L (ref 0–44)
AST: 12 U/L — ABNORMAL LOW (ref 15–41)
Albumin: 3.4 g/dL — ABNORMAL LOW (ref 3.5–5.0)
Alkaline Phosphatase: 49 U/L (ref 38–126)
Anion gap: 5 (ref 5–15)
BUN: 22 mg/dL (ref 8–23)
CO2: 25 mmol/L (ref 22–32)
Calcium: 8.6 mg/dL — ABNORMAL LOW (ref 8.9–10.3)
Chloride: 106 mmol/L (ref 98–111)
Creatinine: 1.24 mg/dL (ref 0.61–1.24)
GFR, Estimated: 57 mL/min — ABNORMAL LOW (ref >60)
Glucose, Bld: 96 mg/dL (ref 70–99)
Potassium: 4.6 mmol/L (ref 3.5–5.1)
Sodium: 136 mmol/L (ref 135–145)
Total Bilirubin: 0.6 mg/dL (ref 0.0–1.2)
Total Protein: 7 g/dL (ref 6.5–8.1)

## 2024-08-01 NOTE — Telephone Encounter (Signed)
 CRITICAL VALUE STICKER  CRITICAL VALUE: WBC 0.9, ANC 0.3  RECEIVER (on-site recipient of call):Zorita Croak, RN   MD NOTIFIED: Charlott Heilingoetter, PA  TIME OF NOTIFICATION:1534  RESPONSE: Ordered injections.   Spoke with patient regarding neutropenic precautions. Coming tomorrow for 1st of 3 injections

## 2024-08-02 ENCOUNTER — Ambulatory Visit: Attending: Cardiovascular Disease | Admitting: Cardiovascular Disease

## 2024-08-02 ENCOUNTER — Encounter: Payer: Self-pay | Admitting: Cardiovascular Disease

## 2024-08-02 ENCOUNTER — Ambulatory Visit

## 2024-08-02 VITALS — BP 121/73 | HR 73 | Ht 67.0 in | Wt 148.9 lb

## 2024-08-02 DIAGNOSIS — R079 Chest pain, unspecified: Secondary | ICD-10-CM | POA: Diagnosis not present

## 2024-08-02 DIAGNOSIS — Z95810 Presence of automatic (implantable) cardiac defibrillator: Secondary | ICD-10-CM

## 2024-08-02 DIAGNOSIS — I5042 Chronic combined systolic (congestive) and diastolic (congestive) heart failure: Secondary | ICD-10-CM

## 2024-08-02 DIAGNOSIS — I495 Sick sinus syndrome: Secondary | ICD-10-CM

## 2024-08-02 DIAGNOSIS — D6869 Other thrombophilia: Secondary | ICD-10-CM

## 2024-08-02 DIAGNOSIS — I441 Atrioventricular block, second degree: Secondary | ICD-10-CM

## 2024-08-02 DIAGNOSIS — Z5111 Encounter for antineoplastic chemotherapy: Secondary | ICD-10-CM | POA: Diagnosis not present

## 2024-08-02 DIAGNOSIS — T451X5A Adverse effect of antineoplastic and immunosuppressive drugs, initial encounter: Secondary | ICD-10-CM

## 2024-08-02 DIAGNOSIS — E785 Hyperlipidemia, unspecified: Secondary | ICD-10-CM

## 2024-08-02 DIAGNOSIS — E1122 Type 2 diabetes mellitus with diabetic chronic kidney disease: Secondary | ICD-10-CM

## 2024-08-02 DIAGNOSIS — I4729 Other ventricular tachycardia: Secondary | ICD-10-CM

## 2024-08-02 DIAGNOSIS — I480 Paroxysmal atrial fibrillation: Secondary | ICD-10-CM

## 2024-08-02 DIAGNOSIS — C3412 Malignant neoplasm of upper lobe, left bronchus or lung: Secondary | ICD-10-CM

## 2024-08-02 DIAGNOSIS — I10 Essential (primary) hypertension: Secondary | ICD-10-CM

## 2024-08-02 DIAGNOSIS — N183 Chronic kidney disease, stage 3 unspecified: Secondary | ICD-10-CM

## 2024-08-02 DIAGNOSIS — I25118 Atherosclerotic heart disease of native coronary artery with other forms of angina pectoris: Secondary | ICD-10-CM

## 2024-08-02 DIAGNOSIS — I700 Atherosclerosis of aorta: Secondary | ICD-10-CM | POA: Insufficient documentation

## 2024-08-02 DIAGNOSIS — Z794 Long term (current) use of insulin: Secondary | ICD-10-CM

## 2024-08-02 DIAGNOSIS — N1832 Chronic kidney disease, stage 3b: Secondary | ICD-10-CM

## 2024-08-02 MED ORDER — METOPROLOL SUCCINATE ER 25 MG PO TB24: 25.0000 mg | ORAL_TABLET | Freq: Every day | ORAL | 3 refills | Status: AC

## 2024-08-02 MED ORDER — FILGRASTIM-SNDZ 300 MCG/0.5ML IJ SOSY
300.0000 ug | PREFILLED_SYRINGE | Freq: Once | INTRAMUSCULAR | Status: AC
  Administered 2024-08-02: 300 ug via SUBCUTANEOUS
  Filled 2024-08-02: qty 0.5

## 2024-08-02 NOTE — Patient Instructions (Addendum)
 Medication Instructions:  Stop Isosorbide  Mononitrate Increase Metoprolol  Succinate to 25 mg daily *If you need a refill on your cardiac medications before your next appointment, please call your pharmacy*  Lab Work: None ordered If you have labs (blood work) drawn today and your tests are completely normal, you will receive your results only by: MyChart Message (if you have MyChart) OR A paper copy in the mail If you have any lab test that is abnormal or we need to change your treatment, we will call you to review the results.  Testing/Procedures: None ordered  Follow-Up: At [facility], you and your health needs are our priority.  As part of our continuing mission to provide you with exceptional heart care, our providers are all part of one team.  This team includes your primary Cardiologist (physician) and Advanced Practice Providers or APPs (Physician Assistants and Nurse Practitioners) who all work together to provide you with the care you need, when you need it.  Your next appointment:   6 month(s)  Provider:   Jerel Balding, MD    We recommend signing up for the patient portal called MyChart.  Sign up information is provided on this After Visit Summary.  MyChart is used to connect with patients for Virtual Visits (Telemedicine).  Patients are able to view lab/test results, encounter notes, upcoming appointments, etc.  Non-urgent messages can be sent to your provider as well.   To learn more about what you can do with MyChart, go to [email or web address].

## 2024-08-02 NOTE — Progress Notes (Signed)
 Cardiology Office Note:    Date:  08/02/2024   ID:  [name], DOB 10-29-1941, MRN [id number]  PCP:  [name], MD  Cardiologist: Burnard Referring MD: [name], MD   No chief complaint on file.    History of Present Illness:    [name] is a 83 y.o. 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), asymptomatic paroxysmal atrial fibrillation recorded by pacemaker, presenting with ventricular tachycardia and syncope in December 2020, underwent upgrade to CRT-D device [facility], new [place] 2Q Durata ICD lead, Dr. Waddell).  He is receiving chemotherapy for stage IIIC/IV (T3, N3, M0) non-small cell lung cancer, adenocarcinoma of the left upper lobe, his oncologist is Dr. Gatha.  He said a few episodes of dizziness.  Most of these events occurred when he is changing position in a pattern suggestive of orthostatic hypotension, but has also had a few events of light dizziness occurring randomly at rest.  He has not had near-syncope or syncope.  He denies chest pain at rest or with activity, dyspnea at rest or with activity, lower extremity edema, focal neurological deficits, orthopnea, PND, claudication.  He has lost a lot of weight during treatment for cancer.  CRT-D device interrogation shows normal device function.  Lead parameters are normal and estimated generator longevity is about 2 years.  Presenting rhythm is AV sequential pacing he has 93% atrial pacing with a decent heart rate histogram distribution and 97%  biventricular pacing efficiency.  His burden of atrial fibrillation is around 1% and all the episodes are very brief, none occurring in the last month.  Since April he has had 5 episodes of nonsustained VT, occurring roughly once a month.  All of these appear to be monomorphic.  The longest event lasted for 8 seconds and was quite fast at 200 bpm.  Although that particular event occurred during sleep hours, other events have occurred during the daytime, often around lunch.    His thoracic impedance suggested some hypervolemia in August but this returned to baseline very quickly.  There is a slight downward trend at this point, not yet triggering a diagnosis of increased volume.  Heart failure therapy includes the maximum tolerated doses of Entresto , spironolactone , metoprolol  succinate.  He is on anticoagulation with apixaban .  He has very good metabolic control with an LDL cholesterol 53, hemoglobin A1c of 5.2% and creatinine close to baseline at 1.46 as of last week actually even better at 1.24 yesterday.  Potassium level was consistently normal.  Labs obtained yesterday showed that he is profoundly leukopenic (0.9 K) and mildly anemic (hemoglobin 9.5) and thrombocytopenic (104 K).  He is growing for growth factor injection tomorrow.  Past Medical History:  Diagnosis Date   AICD (automatic cardioverter/defibrillator) present    CAD (coronary artery disease) 80% stenosis diag of the LAD, 30% in OM2 branch of LCX in 2009    a. Nonobstructive CAD by cath 11/2011 with the exception of the pre-existing diagonal branch #2 lesion.   Chronic systolic CHF (congestive heart failure) (HCC)    CKD (chronic kidney disease)  stage 3, GFR 30-59 ml/min (HCC)    Colon polyp, hyperplastic    History of kidney stones    History of radiation therapy    Left Lung- 08/17/23-09/29/23- Dr. Lynwood Nasuti   History of stress test 06/01/2012   Normal myocardial perfusion study. compared to the previous study there is no  significant change. this is a low risk scan   Hypertension    Legally blind    both eyes   Myocardial infarction [facility]) 11/22/2011   NICM (nonischemic cardiomyopathy) (HCC)    a. Remote hx of dilated NICM with EF ranging 20-45%, including normal EF by echo (55-60%) in 2014.   Peripheral arterial disease (HCC)    a. 06/2014: ABI right 0.99, left 1.2, LE dopplers revealing an occluded right posterior tibial. As symptoms were not felt r/t claudication, no further w/u at the time.   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 Requiring discontinuation of BB dose.   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    Past Surgical History:  Procedure Laterality Date   BACK SURGERY     BIV UPGRADE N/A 11/08/2019   Procedure: BIV UPGRADE;  Surgeon: Waddell Danelle ORN, MD;  [facility]: [facility];  Service: Cardiovascular;  Laterality: N/A;   BRONCHIAL BIOPSY  07/05/2023   Procedure: BRONCHIAL BIOPSIES;  Surgeon: Shelah Rodney RAMAN, MD;  Location: [facility] ENDOSCOPY;  Service: Pulmonary;;   BRONCHIAL BRUSHINGS  07/05/2023   Procedure: BRONCHIAL BRUSHINGS;  Surgeon: Shelah Rodney RAMAN, MD;  Location: [facility] ENDOSCOPY;  Service: Pulmonary;;   BRONCHIAL NEEDLE ASPIRATION BIOPSY  07/05/2023   Procedure: BRONCHIAL NEEDLE ASPIRATION BIOPSIES;  Surgeon: Shelah Rodney RAMAN, MD;  Location: [facility] ENDOSCOPY;  Service: Pulmonary;;   BRONCHIAL WASHINGS  07/05/2023   Procedure: BRONCHIAL WASHINGS;  Surgeon: Shelah Rodney RAMAN, MD;  Location: [facility] ENDOSCOPY;  Service: Pulmonary;;   CARDIAC CATHETERIZATION  11/2011   CARDIAC CATHETERIZATION  11/2011   didn't demonstrate high grade obstructive disease to account for his LV dysfunction.   CATARACT EXTRACTION, BILATERAL  1990's   CYSTOSCOPY     CYSTOSCOPY WITH BIOPSY Bilateral 08/03/2021   Procedure: CYSTOSCOPY WITH BLADDER BIOPSY, BILATERAL RETROGRADE PYELOGRAM;  Surgeon:  Carolee Sherwood JONETTA DOUGLAS, MD;  Location: [facility];  Service: Urology;  Laterality: Bilateral;  REQUESTING 45 MINS   CYSTOSCOPY WITH URETHRAL DILATATION N/A 05/04/2013   Procedure: CYSTOSCOPY WITH URETHRAL DILATATION;  Surgeon: Alm RAMAN Molt, MD;  Location: [facility] NEURO ORS;  Service: Neurosurgery;  Laterality: N/A;  with insertion of foley catheter   EP study and ablation of VT  7/13   PVC focus mapped to the right coronary cusp of the aorta, limited ablation performed due to proximity of the focus to the right coronary artery   EYE SURGERY     FINE NEEDLE ASPIRATION  07/05/2023   Procedure: FINE NEEDLE ASPIRATION (FNA) LINEAR;  Surgeon: Shelah Rodney RAMAN, MD;  Location: [facility] ENDOSCOPY;  Service: Pulmonary;;   LEFT HEART CATH AND CORONARY ANGIOGRAPHY N/A 11/07/2019   Procedure: LEFT HEART CATH AND CORONARY ANGIOGRAPHY;  Surgeon: Darron Deatrice LABOR, MD;  Location: [facility];  Service: Cardiovascular;  Laterality: N/A;   LEFT HEART CATHETERIZATION WITH CORONARY ANGIOGRAM N/A 11/25/2011   Procedure: LEFT HEART CATHETERIZATION WITH CORONARY ANGIOGRAM;  Surgeon: Alm ORN Clay, MD;  Location: [facility] CATH LAB;  Service: Cardiovascular;  Laterality: N/A;   PACEMAKER IMPLANT  N/A 07/12/2018   Procedure: PACEMAKER IMPLANT;  Surgeon: Francyne Headland, MD;  Location: [facility];  Service: Cardiovascular;  Laterality: N/A;   POSTERIOR FUSION LUMBAR SPINE  1979   ROTATOR CUFF REPAIR  2000's   left   V-TACH ABLATION N/A 06/06/2012   Procedure: V-TACH ABLATION;  Surgeon: Lynwood Rakers, MD;  Location: [facility] CATH LAB;  Service: Cardiovascular;  Laterality: N/A;   VIDEO BRONCHOSCOPY WITH ENDOBRONCHIAL ULTRASOUND N/A 07/05/2023   Procedure: VIDEO BRONCHOSCOPY WITH ENDOBRONCHIAL ULTRASOUND;  Surgeon: Shelah Rodney RAMAN, MD;  Location: [facility] ENDOSCOPY;  Service: Pulmonary;  Laterality: N/A;    Current Medications: Current Meds  Medication Sig   apixaban  (ELIQUIS ) 2.5 MG TABS tablet Take 1 tablet (2.5 mg total) by mouth 2 (two) times  daily.   BREO ELLIPTA  100-25 MCG/INH AEPB Inhale 1 puff into the lungs every morning.   brimonidine  (ALPHAGAN ) 0.2 % ophthalmic solution Place 1 drop into both eyes 2 (two) times daily.   Continuous Glucose Sensor (FREESTYLE LIBRE 3 PLUS SENSOR) MISC SMARTSIG:Topical Every 3 Months   cycloSPORINE  (RESTASIS ) 0.05 % ophthalmic emulsion Place 1 drop into both eyes 2 (two) times daily.   dorzolamide -timolol  (COSOPT ) 22.3-6.8 MG/ML ophthalmic solution Place 1 drop into both eyes 2 (two) times daily.   doxycycline  (VIBRAMYCIN ) 100 MG capsule Take 1 capsule (100 mg total) by mouth 2 (two) times daily.   ENTRESTO  24-26 MG TAKE 1 TABLET BY MOUTH TWICE DAILY   folic acid  (FOLVITE ) 1 MG tablet Take 1 tablet (1 mg total) by mouth daily. Start 7 days before pemetrexed  chemotherapy. Continue until 21 days after pemetrexed  completed.   furosemide  (LASIX ) 20 MG tablet Take 0.5 tablets (10 mg total) by mouth daily.   Glucagon , rDNA, (GLUCAGON  EMERGENCY) 1 MG KIT Inject 1 mg into the skin as needed for up to 2 doses (Severe low blood sugar).   hydrocortisone  1 % lotion Apply 1 Application topically 2 (two) times daily.   hydrOXYzine  (ATARAX ) 25 MG tablet Take 1 tablet (25 mg total) by mouth every 6 (six) hours as needed for itching.   insulin  isophane & regular human KwikPen (HUMULIN 70/30 MIX) (70-30) 100 UNIT/ML KwikPen Inject 25 Units into the skin in the morning and at bedtime. (Patient taking differently: Inject 5 Units into the skin daily at 6 (six) AM.)   lidocaine  (LIDODERM ) 5 % Place 1 patch onto the skin daily. Remove & Discard patch within 12 hours or as directed by MD   omeprazole  (PRILOSEC) 20 MG capsule Take 1 capsule (20 mg total) by mouth daily.   ROCKLATAN  0.02-0.005 % SOLN Place 1 drop into both eyes at bedtime.   spironolactone  (ALDACTONE ) 25 MG tablet Take 12.5 mg by mouth daily.   VYZULTA 0.024 % SOLN Apply 1 drop to eye at bedtime.   [DISCONTINUED] isosorbide  mononitrate (IMDUR ) 30 MG 24 hr  tablet Take 30 mg by mouth daily.   [DISCONTINUED] metoprolol  succinate (TOPROL -XL) 25 MG 24 hr tablet Take 0.5 tablets (12.5 mg total) by mouth daily.     Allergies:   Paclitaxel , Allegra [fexofenadine], and Sonafine [wound dressings]   Family History: The patient's family history includes Asthma in his mother; Other in an other family member.  EKGs/Labs/Other Studies Reviewed:    The following studies were reviewed today: Comprehensive pacemaker check in the office today 08/02/2024  EKG:  EKG is not ordered today.  Personally reviewed the tracing from 10/11/2023 shows AV sequentially paced rhythm positive R wave in lead V1.  EKG Interpretation  Date/Time:  Thursday August 02 2024 08:53:04 EDT Ventricular Rate:  73 PR Interval:    QRS Duration:  140 QT Interval:  134 QTC Calculation: 148 R Axis:   258  Text Interpretation: AV SEQUENTIAL PACEMAKER Biventricular pacemaker detected When compared with ECG of 12-Jul-2024 12:59, Premature ventricular complexes are no longer Present Confirmed by Savahanna Almendariz [phone]) on 08/02/2024 9:18:52 AM        Recent Labs: 06/10/2024: Magnesium  1.7 06/25/2024: TSH 1.460 08/01/2024: ALT 8; BUN 22; Creatinine 1.24; Hemoglobin 9.5; Platelet Count 104; Potassium 4.6; Sodium 136    Recent Lipid Panel    Component Value Date/Time   CHOL 102 06/09/2024 1700   CHOL 118 01/12/2022 0916   TRIG 39 06/09/2024 1700   HDL 41 06/09/2024 1700   HDL 47 01/12/2022 0916   CHOLHDL 2.5 06/09/2024 1700   VLDL 8 06/09/2024 1700   LDLCALC 53 06/09/2024 1700   LDLCALC 60 01/12/2022 0916    Physical Exam:    VS:  BP 121/73 (BP Location: Left Arm, Patient Position: Sitting, Cuff Size: Normal)   Pulse 73   Ht 5' 7 (1.702 m)   Wt 148 lb 14.4 oz (67.5 kg)   SpO2 98%   BMI 23.32 kg/m     Wt Readings from Last 3 Encounters:  08/02/24 148 lb 14.4 oz (67.5 kg)  07/25/24 148 lb 3.2 oz (67.2 kg)  07/16/24 148 lb (67.1 kg)     General: Alert, oriented x3,  no distress, healthy ICD site Head: no evidence of trauma, PERRL, EOMI, no exophtalmos or lid lag, no myxedema, no xanthelasma; normal ears, nose and oropharynx Neck: normal jugular venous pulsations and no hepatojugular reflux; brisk carotid pulses without delay and no carotid bruits Chest: clear to auscultation, no signs of consolidation by percussion or palpation, normal fremitus, symmetrical and full respiratory excursions Cardiovascular: normal position and quality of the apical impulse, regular rhythm, normal first and second heart sounds, no murmurs, rubs or gallops Abdomen: no tenderness or distention, no masses by palpation, no abnormal pulsatility or arterial bruits, normal bowel sounds, no hepatosplenomegaly Extremities: no clubbing, cyanosis or edema; 2+ radial, ulnar and brachial pulses bilaterally; 2+ right femoral, posterior tibial and dorsalis pedis pulses; 2+ left femoral, posterior tibial and dorsalis pedis pulses; no subclavian or femoral bruits Neurological: grossly nonfocal Psych: Normal mood and affect    ASSESSMENT:    1. Chest pain, unspecified type   2. Chronic combined systolic and diastolic heart failure (HCC)   3. NSVT (nonsustained ventricular tachycardia) (HCC)   4. Paroxysmal atrial fibrillation (HCC)   5. Acquired thrombophilia (HCC)   6. Second degree AV block, Mobitz type I   7. Biventricular automatic implantable cardioverter defibrillator in situ   8. SSS (sick sinus syndrome) (HCC)   9. Coronary artery disease of native artery of native heart with stable angina pectoris (HCC)   10. Essential hypertension   11. Stage 3b chronic kidney disease (HCC)   12. Aortic atherosclerosis (HCC)   13. Dyslipidemia (high LDL; low HDL)   14. 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 Controlled type 2 diabetes mellitus with stage 3 chronic kidney disease, with long-term current use of insulin  (HCC)       PLAN:    In order of problems listed  above:  HFrEF: NYHA functional class I.  Clinically euvolemic.  On maximum tolerated doses of Entresto , spironolactone , metoprolol  succinate.  Not on SGLT2 inhibitor.  Blood pressure probably will not  allow additional dose titration.  I think he should take the furosemide  on a as needed basis similar. VT: He has had this in years past (even had a syncopal event related to VT in December 2021, history of VT ablation in 2013), but ventricular arrhythmia has been infrequent until recently.  Seems to have increased in frequency while he is getting chemotherapy.  Will increase the metoprolol  succinate to 25 mg daily. Paroxysmal atrial fibrillation: Very low prevalence at 1.2% with good rate control.  Appropriately anticoagulated. CHADSVasc 5 (age 30, HTN, CHF, CAD).   Anticoagulation: Eliquis  dose adjusted for age and renal dysfunction (although recently his creatinine has been better).  Did not change the Eliquis  dose today since he is a little thrombocytopenic and usually his renal function is worse, also since the overall burden of atrial fibrillation is very low.  Had remote problems with epistaxis and some hematuria with his episode of cystitis earlier this year. 2nd deg AVB: Not fully pacemaker dependent, but high prevalence of ventricular pacing before receiving his upgrade to CRT. CRT-D: Good biventricular pacing efficiency despite PVCs and A-fib.  Normal device function.  Continue remote downloads every 3 months. SSS: Almost 100% atrial paced.  Current sensor settings show appropriate heart rate histogram distribution. CAD: He is asymptomatic and has not had angina that required sublingual nitroglycerin  very long time.  Will stop his long-acting nitrates and leave him just on beta-blockers, hopefully this will alleviate some of the symptoms of orthostatic dizziness.  Significant stenoses were seen only secondary coronary branches.   HTN: Blood pressure in desirable range.  Stopping isosorbide . CKD 3:  Most recent creatinine a little better at 1.24, but usually his creatinine has been in the 1.5-1.6 range.  GFR 40-45. Ao atherosclerosis: incidentally noted on CT, normal caliber aorta. DM: Hemoglobin A1c 5.2% recently.  Probably needs to adjust dose of insulin  downward. HLP: Most recent LDL excellent 53.  Chronically low HDL at 39.   Advanced neoplastic lung disease: on chemotherapy, followed in the cancer center by Dr. Sherrod.  Currently showing pancytopenia in particular leukopenia.  Told him to avoid crowds and sick contacts.  Going for Neupogen  injection tomorrow.   Medication Adjustments/Labs and Tests Ordered: Current medicines are reviewed at length with the patient today.  Concerns regarding medicines are outlined above.  Orders Placed This Encounter  Procedures   EKG 12-Lead    Meds ordered this encounter  Medications   metoprolol  succinate (TOPROL -XL) 25 MG 24 hr tablet    Sig: Take 1 tablet (25 mg total) by mouth daily.    Dispense:  90 tablet    Refill:  3     Patient Instructions  Medication Instructions:  Stop Isosorbide  Mononitrate Increase Metoprolol  Succinate to 25 mg daily *If you need a refill on your cardiac medications before your next appointment, please call your pharmacy*  Lab Work: None ordered If you have labs (blood work) drawn today and your tests are completely normal, you will receive your results only by: MyChart Message (if you have MyChart) OR A paper copy in the mail If you have any lab test that is abnormal or we need to change your treatment, we will call you to review the results.  Testing/Procedures: None ordered  Follow-Up: At [facility], you and your health needs are our priority.  As part of our continuing mission to provide you with exceptional heart care, our providers are all part of one team.  This team includes your primary Cardiologist (physician)  and Advanced Practice Providers or APPs (Physician Assistants and Nurse  Practitioners) who all work together to provide you with the care you need, when you need it.  Your next appointment:   6 month(s)  Provider:   Jerel Balding, MD    We recommend signing up for the patient portal called MyChart.  Sign up information is provided on this After Visit Summary.  MyChart is used to connect with patients for Virtual Visits (Telemedicine).  Patients are able to view lab/test results, encounter notes, upcoming appointments, etc.  Non-urgent messages can be sent to your provider as well.   To learn more about what you can do with MyChart, go to [email or web address].      Signed, Jerel Balding, MD  08/02/2024 2:54 PM    [facility]

## 2024-08-03 ENCOUNTER — Inpatient Hospital Stay

## 2024-08-03 VITALS — BP 151/61 | HR 70 | Temp 98.5°F | Resp 16

## 2024-08-03 DIAGNOSIS — T451X5A Adverse effect of antineoplastic and immunosuppressive drugs, initial encounter: Secondary | ICD-10-CM

## 2024-08-03 DIAGNOSIS — Z5111 Encounter for antineoplastic chemotherapy: Secondary | ICD-10-CM | POA: Diagnosis not present

## 2024-08-03 MED ORDER — FILGRASTIM-SNDZ 300 MCG/0.5ML IJ SOSY
300.0000 ug | PREFILLED_SYRINGE | Freq: Once | INTRAMUSCULAR | Status: AC
  Administered 2024-08-03: 300 ug via SUBCUTANEOUS
  Filled 2024-08-03: qty 0.5

## 2024-08-03 NOTE — Patient Instructions (Signed)
 Filgrastim Injection What is this medication? FILGRASTIM (fil GRA stim) lowers the risk of infection in people who are receiving chemotherapy. It works by Systems analyst make more white blood cells, which protects your body from infection. It may also be used to help people who have been exposed to high doses of radiation. It can be used to help prepare your body before a stem cell transplant. It works by helping your bone marrow make and release stem cells into the blood. This medicine may be used for other purposes; ask your health care provider or pharmacist if you have questions. COMMON BRAND NAME(S): Neupogen, Nivestym, Nypozi, Releuko, Zarxio What should I tell my care team before I take this medication? They need to know if you have any of these conditions: History of blood diseases, such as sickle cell anemia Kidney disease Recent or ongoing radiation An unusual or allergic reaction to filgrastim, pegfilgrastim, latex, rubber, other medications, foods, dyes, or preservatives Pregnant or trying to get pregnant Breast-feeding How should I use this medication? This medication is injected under the skin or into a vein. It is usually given by your care team in a hospital or clinic setting. It may be given at home. If you get this medication at home, you will be taught how to prepare and give it. Use exactly as directed. Take it as directed on the prescription label at the same time every day. Keep taking it unless your care team tells you to stop. It is important that you put your used needles and syringes in a special sharps container. Do not put them in a trash can. If you do not have a sharps container, call your pharmacist or care team to get one. This medication comes with INSTRUCTIONS FOR USE. Ask your pharmacist for directions on how to use this medication. Read the information carefully. Talk to your pharmacist or care team if you have questions. Talk to your care team about the use of  this medication in children. While it may be prescribed for children for selected conditions, precautions do apply. Overdosage: If you think you have taken too much of this medicine contact a poison control center or emergency room at once. NOTE: This medicine is only for you. Do not share this medicine with others. What if I miss a dose? It is important not to miss any doses. Talk to your care team about what to do if you miss a dose. What may interact with this medication? Medications that may cause a release of neutrophils, such as lithium This list may not describe all possible interactions. Give your health care provider a list of all the medicines, herbs, non-prescription drugs, or dietary supplements you use. Also tell them if you smoke, drink alcohol, or use illegal drugs. Some items may interact with your medicine. What should I watch for while using this medication? Your condition will be monitored carefully while you are receiving this medication. You may need bloodwork while taking this medication. Talk to your care team about your risk of cancer. You may be more at risk for certain types of cancer if you take this medication. What side effects may I notice from receiving this medication? Side effects that you should report to your care team as soon as possible: Allergic reactions--skin rash, itching, hives, swelling of the face, lips, tongue, or throat Capillary leak syndrome--stomach or muscle pain, unusual weakness or fatigue, feeling faint or lightheaded, decrease in the amount of urine, swelling of the ankles, hands,  or feet, trouble breathing High white blood cell level--fever, fatigue, trouble breathing, night sweats, change in vision, weight loss Inflammation of the aorta--fever, fatigue, back, chest, or stomach pain, severe headache Kidney injury (glomerulonephritis)--decrease in the amount of urine, red or dark brown urine, foamy or bubbly urine, swelling of the ankles, hands,  or feet Shortness of breath or trouble breathing Spleen injury--pain in upper left stomach or shoulder Unusual bruising or bleeding Side effects that usually do not require medical attention (report to your care team if they continue or are bothersome): Back pain Bone pain Fatigue Fever Headache Nausea This list may not describe all possible side effects. Call your doctor for medical advice about side effects. You may report side effects to FDA at 1-800-FDA-1088. Where should I keep my medication? Keep out of the reach of children and pets. Keep this medication in the original packaging until you are ready to take it. Protect from light. See product for storage information. Each product may have different instructions. Get rid of any unused medication after the expiration date. To get rid of medications that are no longer needed or have expired: Take the medication to a medications take-back program. Check with your pharmacy or law enforcement to find a location. If you cannot return the medication, ask your pharmacist or care team how to get rid of this medication safely. NOTE: This sheet is a summary. It may not cover all possible information. If you have questions about this medicine, talk to your doctor, pharmacist, or health care provider.  2024 Elsevier/Gold Standard (2022-04-01 00:00:00)

## 2024-08-04 ENCOUNTER — Inpatient Hospital Stay

## 2024-08-04 VITALS — BP 141/79 | HR 73 | Temp 97.7°F | Resp 18

## 2024-08-04 DIAGNOSIS — Z5111 Encounter for antineoplastic chemotherapy: Secondary | ICD-10-CM | POA: Diagnosis not present

## 2024-08-04 DIAGNOSIS — T451X5A Adverse effect of antineoplastic and immunosuppressive drugs, initial encounter: Secondary | ICD-10-CM

## 2024-08-04 MED ORDER — FILGRASTIM-SNDZ 300 MCG/0.5ML IJ SOSY
300.0000 ug | PREFILLED_SYRINGE | Freq: Once | INTRAMUSCULAR | Status: AC
  Administered 2024-08-04: 300 ug via SUBCUTANEOUS

## 2024-08-06 ENCOUNTER — Inpatient Hospital Stay

## 2024-08-06 ENCOUNTER — Inpatient Hospital Stay: Admitting: Physician Assistant

## 2024-08-08 ENCOUNTER — Inpatient Hospital Stay

## 2024-08-08 DIAGNOSIS — C3412 Malignant neoplasm of upper lobe, left bronchus or lung: Secondary | ICD-10-CM

## 2024-08-08 DIAGNOSIS — Z5111 Encounter for antineoplastic chemotherapy: Secondary | ICD-10-CM | POA: Diagnosis not present

## 2024-08-08 DIAGNOSIS — T451X5A Adverse effect of antineoplastic and immunosuppressive drugs, initial encounter: Secondary | ICD-10-CM

## 2024-08-08 LAB — CBC WITH DIFFERENTIAL (CANCER CENTER ONLY)
Abs Immature Granulocytes: 0.29 K/uL — ABNORMAL HIGH (ref 0.00–0.07)
Basophils Absolute: 0 K/uL (ref 0.0–0.1)
Basophils Relative: 1 %
Eosinophils Absolute: 0.1 K/uL (ref 0.0–0.5)
Eosinophils Relative: 3 %
HCT: 28.5 % — ABNORMAL LOW (ref 39.0–52.0)
Hemoglobin: 9.3 g/dL — ABNORMAL LOW (ref 13.0–17.0)
Immature Granulocytes: 7 %
Lymphocytes Relative: 13 %
Lymphs Abs: 0.6 K/uL — ABNORMAL LOW (ref 0.7–4.0)
MCH: 29.2 pg (ref 26.0–34.0)
MCHC: 32.6 g/dL (ref 30.0–36.0)
MCV: 89.3 fL (ref 80.0–100.0)
Monocytes Absolute: 0.7 K/uL (ref 0.1–1.0)
Monocytes Relative: 16 %
Neutro Abs: 2.5 K/uL (ref 1.7–7.7)
Neutrophils Relative %: 60 %
Platelet Count: 89 K/uL — ABNORMAL LOW (ref 150–400)
RBC: 3.19 MIL/uL — ABNORMAL LOW (ref 4.22–5.81)
RDW: 14.6 % (ref 11.5–15.5)
WBC Count: 4.2 K/uL (ref 4.0–10.5)
nRBC: 0 % (ref 0.0–0.2)

## 2024-08-08 LAB — CMP (CANCER CENTER ONLY)
ALT: 8 U/L (ref 0–44)
AST: 12 U/L — ABNORMAL LOW (ref 15–41)
Albumin: 3.4 g/dL — ABNORMAL LOW (ref 3.5–5.0)
Alkaline Phosphatase: 57 U/L (ref 38–126)
Anion gap: 5 (ref 5–15)
BUN: 12 mg/dL (ref 8–23)
CO2: 27 mmol/L (ref 22–32)
Calcium: 8.8 mg/dL — ABNORMAL LOW (ref 8.9–10.3)
Chloride: 105 mmol/L (ref 98–111)
Creatinine: 1.08 mg/dL (ref 0.61–1.24)
GFR, Estimated: >60 mL/min (ref >60)
Glucose, Bld: 90 mg/dL (ref 70–99)
Potassium: 4 mmol/L (ref 3.5–5.1)
Sodium: 137 mmol/L (ref 135–145)
Total Bilirubin: 0.4 mg/dL (ref 0.0–1.2)
Total Protein: 7.1 g/dL (ref 6.5–8.1)

## 2024-08-10 NOTE — Progress Notes (Addendum)
 [facility] OFFICE PROGRESS NOTE  Rodney Aland, MD [address] [place] [address]  DIAGNOSIS: Stage IIIC/IV (T3, N3, M0) non-small cell lung cancer, adenocarcinoma. He presented with a left upper lobe spiculated mass in addition to lingular lesion and suspicious mediastinal lymphadenopathy He also has bilateral hypermetabolic axillary lymph nodes which could be related to metastatic disease versus inflammatory as the patient did have vaccines in both of his arms the week prior to his PET scan.  This was diagnosed in August 2024.   Molecular Studies: His PD-L1 expression was 1% and he has no actionable mutations.  PRIOR THERAPY: 1) Concurrent chemoradiation with carboplatin  for an AUC of 2 and paclitaxel  45 mg/m.  First dose expected on 08/15/23.  Status post 5 cycles of first cycle was given with carboplatin  and paclitaxel  and starting from cycle #2 he was on carboplatin  and Abraxane  secondary to hypersensitivity reaction to paclitaxel . 2) Consolidation immunotherapy with Imfinzi  1500 Mg IV every 4 weeks.  First dose November 28, 2023.  Status post 2 cycles.  Discontinued secondary to intolerance and patient is requested  CURRENT THERAPY: First-line systemic chemotherapy with carboplatin  for AUC of 4, Alimta  400 Mg/M2 and Keytruda  200 Mg IV every 3 weeks. First dose June 20, 2024. Starting cycle #2 Keytruda  will be discontinued secondary to intolerance with significant itching and patient's preference. He is status post 2 cycles.   INTERVAL HISTORY: [name] 83 y.o. male returns to the clinic today for a follow-up visit. The patient was last seen in the clinic by myself and Dr. Sherrod on 07/25/24.    In summary the patient is currently undergoing chemotherapy.  He is status post 2 cycles.  He is on a reduced dose of chemo.  He did require G-CSF injections in the interval.  He denies any signs and symptoms of infection.  He still has some itching on his chest if he  has sweating.  Hydrocortisone  cream is not effective.  He takes Zyrtec.  He denies any fever, chills, or night sweats.  He reports his appetite is all right.  His weight is stable.  He may get shortness of breath with exertion such as taking a shower.  I offered a shower chair but he states he had 1 and sent it back.  He denies any significant cough, chest pain, or any hemoptysis.  He denies any nausea or vomiting.  He denies any diarrhea.  He sometimes may have constipation.  Is not taking anything for this.  He is wondering how his cancer is doing.  He denies any headaches or vision changes.  He is here today for evaluation and repeat blood work before undergoing cycle #3    He is here today for evaluation and repeat blood work before considering starting cycle #3.   MEDICAL HISTORY: Past Medical History:  Diagnosis Date   AICD (automatic cardioverter/defibrillator) present    CAD (coronary artery disease) 80% stenosis diag of the LAD, 30% in OM2 branch of LCX in 2009    a. Nonobstructive CAD by cath 11/2011 with the exception of the pre-existing diagonal branch #2 lesion.   Chronic systolic CHF (congestive heart failure) (HCC)    CKD (chronic kidney disease) stage 3, GFR 30-59 ml/min (HCC)    Colon polyp, hyperplastic    History of kidney stones    History of radiation therapy    Left Lung- 08/17/23-09/29/23- Dr. Lynwood Stevens   History of stress test 06/01/2012   Normal myocardial  perfusion study. compared to the previous study there is no significant change. this is a low risk scan   Hypertension    Legally blind    both eyes   Myocardial infarction [facility]) 11/22/2011   NICM (nonischemic cardiomyopathy) (HCC)    a. Remote hx of dilated NICM with EF ranging 20-45%, including normal EF by echo (55-60%) in 2014.   Peripheral arterial disease    a. 06/2014: ABI right 0.99, left 1.2, LE dopplers revealing an occluded right posterior tibial. As symptoms were not felt r/t claudication, no  further w/u at the time.   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 Requiring discontinuation of BB dose.   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  MEDICATIONS:  Current Outpatient Medications  Medication Sig Dispense Refill   triamcinolone  (KENALOG ) 0.025 % ointment Apply 1 Application topically 2 (two) times daily. 15 g 0   apixaban  (ELIQUIS ) 2.5 MG TABS tablet Take 1 tablet (2.5 mg total) by mouth 2 (two) times daily. 180 tablet 1   BREO ELLIPTA  100-25 MCG/INH AEPB Inhale 1 puff into the lungs every morning.     brimonidine  (ALPHAGAN ) 0.2 % ophthalmic solution Place 1 drop into both eyes 2 (two) times daily.     Continuous Glucose Sensor (FREESTYLE LIBRE 3 PLUS SENSOR) MISC SMARTSIG:Topical Every 3 Months     cycloSPORINE  (RESTASIS ) 0.05 % ophthalmic emulsion Place 1 drop into both eyes 2 (two) times daily.     dorzolamide -timolol  (COSOPT ) 22.3-6.8 MG/ML ophthalmic solution Place 1 drop into both eyes 2 (two) times daily.     doxycycline  (VIBRAMYCIN ) 100 MG capsule Take 1 capsule (100 mg total) by mouth 2 (two) times daily. 20 capsule 0   ENTRESTO  24-26 MG TAKE 1 TABLET BY MOUTH TWICE DAILY 180 tablet 3   folic acid  (FOLVITE ) 1 MG tablet Take 1 tablet (1 mg total) by mouth daily. Start 7 days before pemetrexed  chemotherapy. Continue until 21 days after pemetrexed  completed. 100 tablet 3   furosemide  (LASIX ) 20 MG tablet Take 0.5 tablets (10 mg total) by mouth daily. 30 tablet 1   Glucagon , rDNA, (GLUCAGON  EMERGENCY) 1 MG KIT Inject 1 mg into the skin as needed for up to 2 doses (Severe low blood sugar). 1 kit 0   hydrocortisone  1 % lotion Apply 1 Application topically 2 (two) times daily. 113 g 0   hydrOXYzine  (ATARAX ) 25 MG tablet Take 1 tablet (25 mg total) by  mouth every 6 (six) hours as needed for itching. 12 tablet 0   insulin  isophane & regular human KwikPen (HUMULIN 70/30 MIX) (70-30) 100 UNIT/ML KwikPen Inject 25 Units into the skin in the morning and at bedtime. (Patient taking differently: Inject 5 Units into the skin daily at 6 (six) AM.) 15 mL 0   lidocaine  (LIDODERM ) 5 % Place 1 patch onto the skin daily. Remove & Discard patch within 12 hours or as directed by MD 30 patch 0   metoprolol  succinate (TOPROL -XL) 25 MG 24 hr tablet Take 1 tablet (25 mg total) by mouth daily. 90 tablet 3   omeprazole  (PRILOSEC) 20 MG capsule Take 1 capsule (20 mg total) by mouth daily. 30 capsule 2   ondansetron  (ZOFRAN ) 8 MG tablet Take 1 tablet (8 mg total) by mouth every 8 (eight) hours  as needed for nausea or vomiting. Start on the third day after carboplatin . (Patient not taking: Reported on 08/02/2024) 30 tablet 1   ROCKLATAN  0.02-0.005 % SOLN Place 1 drop into both eyes at bedtime.     spironolactone  (ALDACTONE ) 25 MG tablet Take 12.5 mg by mouth daily.     VYZULTA 0.024 % SOLN Apply 1 drop to eye at bedtime.     No current facility-administered medications for this visit.    SURGICAL HISTORY:  Past Surgical History:  Procedure Laterality Date   BACK SURGERY     BIV UPGRADE N/A 11/08/2019   Procedure: BIV UPGRADE;  Surgeon: Waddell Danelle ORN, MD;  Location: [facility] INVASIVE CV LAB;  Service: Cardiovascular;  Laterality: N/A;   BRONCHIAL BIOPSY  07/05/2023   Procedure: BRONCHIAL BIOPSIES;  Surgeon: Shelah Lamar RAMAN, MD;  Location: [facility] ENDOSCOPY;  Service: Pulmonary;;   BRONCHIAL BRUSHINGS  07/05/2023   Procedure: BRONCHIAL BRUSHINGS;  Surgeon: Shelah Lamar RAMAN, MD;  Location: [facility] ENDOSCOPY;  Service: Pulmonary;;   BRONCHIAL NEEDLE ASPIRATION BIOPSY  07/05/2023   Procedure: BRONCHIAL NEEDLE ASPIRATION BIOPSIES;  Surgeon: Shelah Lamar RAMAN, MD;  Location: [facility] ENDOSCOPY;  Service: Pulmonary;;   BRONCHIAL WASHINGS  07/05/2023   Procedure: BRONCHIAL WASHINGS;  Surgeon:  Shelah Lamar RAMAN, MD;  Location: [facility] ENDOSCOPY;  Service: Pulmonary;;   CARDIAC CATHETERIZATION  11/2011   CARDIAC CATHETERIZATION  11/2011   didn't demonstrate high grade obstructive disease to account for his LV dysfunction.   CATARACT EXTRACTION, BILATERAL  1990's   CYSTOSCOPY     CYSTOSCOPY WITH BIOPSY Bilateral 08/03/2021   Procedure: CYSTOSCOPY WITH BLADDER BIOPSY, BILATERAL RETROGRADE PYELOGRAM;  Surgeon: Carolee Sherwood JONETTA DOUGLAS, MD;  Location: [facility] ORS;  Service: Urology;  Laterality: Bilateral;  REQUESTING 45 MINS   CYSTOSCOPY WITH URETHRAL DILATATION N/A 05/04/2013   Procedure: CYSTOSCOPY WITH URETHRAL DILATATION;  Surgeon: Alm RAMAN Molt, MD;  Location: [facility] NEURO ORS;  Service: Neurosurgery;  Laterality: N/A;  with insertion of foley catheter   EP study and ablation of VT  7/13   PVC focus mapped to the right coronary cusp of the aorta, limited ablation performed due to proximity of the focus to the right coronary artery   EYE SURGERY     FINE NEEDLE ASPIRATION  07/05/2023   Procedure: FINE NEEDLE ASPIRATION (FNA) LINEAR;  Surgeon: Shelah Lamar RAMAN, MD;  Location: [facility] ENDOSCOPY;  Service: Pulmonary;;   LEFT HEART CATH AND CORONARY ANGIOGRAPHY N/A 11/07/2019   Procedure: LEFT HEART CATH AND CORONARY ANGIOGRAPHY;  Surgeon: Darron Deatrice LABOR, MD;  Location: [facility] INVAS[facility] CV LAB;  Service: Cardiovascular;  Laterality: N/A;   LEFT HEART CATHETERIZATION WITH CORONARY ANGIOGRAM N/A 11/25/2011   Procedure: LEFT HEART CATHETERIZATION WITH CORONARY ANGIOGRAM;  Surgeon: Alm ORN Clay, MD;  Location: [facility] CATH LAB;  Service: Cardiovascular;  Laterality: N/A;   PACEMAKER IMPLANT N/A 07/12/2018   Procedure: PACEMAKER IMPLANT;  Surgeon: Francyne Headland, MD;  Location: [facility] INVAS[facility] CV LAB;  Service: Cardiovascular;  Laterality: N/A;   POSTERIOR FUSION LUMBAR SPINE  1979   ROTATOR CUFF REPAIR  2000's   left   V-TACH ABLATION N/A 06/06/2012   Procedure: V-TACH ABLATION;  Surgeon: Rodney Rakers, MD;  Location: [facility] CATH LAB;   Service: Cardiovascular;  Laterality: N/A;   VIDEO BRONCHOSCOPY WITH ENDOBRONCHIAL ULTRASOUND N/A 07/05/2023   Procedure: VIDEO BRONCHOSCOPY WITH ENDOBRONCHIAL ULTRASOUND;  Surgeon: Shelah Lamar RAMAN, MD;  Location: [facility] ENDOSCOPY;  Service: Pulmonary;  Laterality: N/A;    REVIEW OF SYSTEMS:   Review  of Systems  Constitutional: Positive for stable fatigue. Negative for appetite change, chills, fever and unexpected weight change.  HENT: Negative for mouth sores, nosebleeds, sore throat and trouble swallowing.   Eyes: Negative for eye problems and icterus.  Respiratory: Positive for intermittent shortness of breath with exertion. Negative for cough, hemoptysis, and wheezing.   Cardiovascular: Negative for chest pain and leg swelling.  Gastrointestinal: Negative for abdominal pain, constipation, diarrhea, nausea and vomiting.  Genitourinary: Negative for bladder incontinence, difficulty urinating, dysuria, frequency and hematuria.   Musculoskeletal: Negative for back pain, gait problem, neck pain and neck stiffness.  Skin: Positive for itching and rash.  Neurological: Negative for dizziness, extremity weakness, gait problem, headaches, light-headedness and seizures.  Hematological: Negative for adenopathy. Does not bruise/bleed easily.  Psychiatric/Behavioral: Negative for confusion, depression and sleep disturbance. The patient is not nervous/anxious.     PHYSICAL EXAMINATION:  Blood pressure 121/66, pulse 70, temperature 98 F (36.7 C), temperature source Temporal, resp. rate 14, weight 146 lb 8 oz (66.5 kg), SpO2 93%.  ECOG PERFORMANCE STATUS: 1  Physical Exam  Constitutional: Oriented to person, place, and time and well-developed, well-nourished, and in no distress.  HENT:  Head: Normocephalic and atraumatic.  Mouth/Throat: Oropharynx is clear and moist. No oropharyngeal exudate.  Eyes: Conjunctivae are normal. Right eye exhibits no discharge. Left eye exhibits no discharge. No scleral  icterus.  Neck: Normal range of motion. Neck supple.  Cardiovascular: Normal rate, regular rhythm, normal heart sounds and intact distal pulses.   Pulmonary/Chest: Effort normal and breath sounds normal. No respiratory distress. No wheezes. No rales.  Abdominal: Soft. Bowel sounds are normal. Exhibits no distension and no mass. There is no tenderness.  Musculoskeletal: Normal range of motion. Exhibits no edema.  Lymphadenopathy:    No cervical adenopathy.  Neurological: Alert and oriented to person, place, and time. Exhibits normal muscle tone. Gait normal. Coordination normal. Uses a cane for ambulation.  Skin: Skin is warm and dry. No rash noted. Not diaphoretic. No erythema. No pallor.  Psychiatric: Mood, memory and judgment normal.  Vitals reviewed.  LABORATORY DATA: Lab Results  Component Value Date   WBC 5.5 08/16/2024   HGB 9.4 (L) 08/16/2024   HCT 29.2 (L) 08/16/2024   MCV 91.8 08/16/2024   PLT 207 08/16/2024      Chemistry      Component Value Date/Time   NA 137 08/08/2024 1526   NA 142 11/11/2022 1000   K 4.0 08/08/2024 1526   CL 105 08/08/2024 1526   CO2 27 08/08/2024 1526   BUN 12 08/08/2024 1526   BUN 18 11/11/2022 1000   CREATININE 1.08 08/08/2024 1526   CREATININE 1.37 (H) 01/24/2017 0950      Component Value Date/Time   CALCIUM  8.8 (L) 08/08/2024 1526   ALKPHOS 57 08/08/2024 1526   AST 12 (L) 08/08/2024 1526   ALT 8 08/08/2024 1526   BILITOT 0.4 08/08/2024 1526       RADIOGRAPHIC STUDIES:  No results found.    ASSESSMENT/PLAN:  This is a very pleasant 83 year old African-American male with suspicious stage IIIC/IV (T3, N3, M0) non-small cell lung cancer, adenocarcinoma. He presented with a left upper lobe lung mass and lesion in the lingula.  He also has bilateral mediastinal lymph nodes and hypermetabolic bilateral axillary lymph nodes.  Although it is unclear if the axillary lymph nodes are related to metastatic disease or inflammatory as he did  have bilateral vaccines in his upper arms just prior to his PET scan  and his axillary biopsy was negative. He was He was diagnosed in August 2024.    His molecular studies show no actionable mutations and his PD-L1 expression is 1%   He started a course of concurrent chemoradiation initially with carboplatin  for AUC of 2 and paclitaxel  45 Mg/M2 but paclitaxel  was the changed to Abraxane  starting from cycle #2 secondary to hypersensitivity reaction.  The patient received 5 cycles of concurrent chemoradiation.  He tolerated this treatment fairly well except for fatigue.   The patient then underwent consolidation immunotherapy with Imfinzi  1500 mg IV every 4 weeks.  He status post 2 cycles.  He developed significant itching and rash he decided to stop treatment at that time and the last dose was given in February 2025.   In July 2025 he started having evidence of disease recurrence in July 2025.  Therefore Dr. Sherrod started him on palliative systemic chemotherapy with carboplatin  for an AUC of 4 and Alimta  400 mg/m.  He is status post 2 cycles.  The patient refused Keytruda  due to prior intolerance with immunotherapy.    Labs were reviewed.  Recommend that he proceed cycle #3 today scheduled.   Reviewed the creatinine and alimta  dose with Dr. Sherrod. His creatinine is a slightly better compared to his last appointment when he was treated and he is on reduced dose of alimta . We will administer 1/2 L IVF with treatment today and continue with treatment with close monitoring of his labs weekly.   I will arrange for a restaging CT scan of the chest abdomen, and pelvis prior to his next cycle of treatment. I will change the order to w/o contrast due to his fluctuating kidney function. We will wncourage him to hydrate well at home.    Continue to monitor his labs closely on a weekly basis. I will arrange GCSF injections if his ANC is 0.6 on weekly labs.    Pruritus Persistent pruritus exacerbated by  sweating. Current treatment with hydrocortisone  cream and oral antihistamines is ineffective. - Prescribe Kenalog  cream for pruritus.  Anemia secondary to chemotherapy Anemia likely secondary to chemotherapy, contributing to fatigue and dyspnea on exertion. Current levels do not require transfusion but need monitoring. - Monitor red blood cell counts closely. - Assess for symptoms of fatigue and dyspnea.  Constipation Reports of constipation with infrequent bowel movements. - Recommend Colace or stool softener for constipation. - Advise use of Miralax  if immediate relief is needed, with caution to avoid diarrhea.   The patient was advised to call immediately if he has any concerning symptoms in the interval. The patient voices understanding of current disease status and treatment options and is in agreement with the current care plan. All questions were answered. The patient knows to call the clinic with any problems, questions or concerns. We can certainly see the patient much sooner if necessary    Orders Placed This Encounter  Procedures   CT CHEST ABDOMEN PELVIS W CONTRAST    Standing Status:   Future    Expected Date:   08/30/2024    Expiration Date:   08/16/2025    If indicated for the ordered procedure, I authorize the administration of contrast media per Radiology protocol:   Yes    Does the patient have a contrast media/X-ray dye allergy?:   No    Preferred imaging location?:   [facility]    If indicated for the ordered procedure, I authorize the administration of oral contrast media per Radiology protocol:  Yes     The total time spent in the appointment was 20-29 minutes  Rodney Hopes L Batul Diego, PA-C 08/16/24

## 2024-08-13 ENCOUNTER — Other Ambulatory Visit

## 2024-08-15 ENCOUNTER — Other Ambulatory Visit: Payer: Self-pay | Admitting: Physician Assistant

## 2024-08-15 DIAGNOSIS — D6481 Anemia due to antineoplastic chemotherapy: Secondary | ICD-10-CM

## 2024-08-15 MED FILL — Fosaprepitant Dimeglumine For IV Infusion 150 MG (Base Eq): INTRAVENOUS | Qty: 5 | Status: AC

## 2024-08-16 ENCOUNTER — Other Ambulatory Visit: Payer: Self-pay | Admitting: Physician Assistant

## 2024-08-16 ENCOUNTER — Inpatient Hospital Stay (HOSPITAL_BASED_OUTPATIENT_CLINIC_OR_DEPARTMENT_OTHER): Admitting: Physician Assistant

## 2024-08-16 ENCOUNTER — Inpatient Hospital Stay

## 2024-08-16 ENCOUNTER — Encounter: Payer: Self-pay | Admitting: Internal Medicine

## 2024-08-16 VITALS — BP 121/66 | HR 70 | Temp 98.0°F | Resp 14 | Wt 146.5 lb

## 2024-08-16 DIAGNOSIS — C3490 Malignant neoplasm of unspecified part of unspecified bronchus or lung: Secondary | ICD-10-CM | POA: Diagnosis not present

## 2024-08-16 DIAGNOSIS — R21 Rash and other nonspecific skin eruption: Secondary | ICD-10-CM

## 2024-08-16 DIAGNOSIS — R7989 Other specified abnormal findings of blood chemistry: Secondary | ICD-10-CM

## 2024-08-16 DIAGNOSIS — Z5111 Encounter for antineoplastic chemotherapy: Secondary | ICD-10-CM | POA: Diagnosis not present

## 2024-08-16 DIAGNOSIS — T451X5A Adverse effect of antineoplastic and immunosuppressive drugs, initial encounter: Secondary | ICD-10-CM

## 2024-08-16 DIAGNOSIS — C3412 Malignant neoplasm of upper lobe, left bronchus or lung: Secondary | ICD-10-CM | POA: Diagnosis not present

## 2024-08-16 LAB — CBC WITH DIFFERENTIAL (CANCER CENTER ONLY)
Abs Immature Granulocytes: 0.02 K/uL (ref 0.00–0.07)
Basophils Absolute: 0 K/uL (ref 0.0–0.1)
Basophils Relative: 0 %
Eosinophils Absolute: 0.1 K/uL (ref 0.0–0.5)
Eosinophils Relative: 1 %
HCT: 29.2 % — ABNORMAL LOW (ref 39.0–52.0)
Hemoglobin: 9.4 g/dL — ABNORMAL LOW (ref 13.0–17.0)
Immature Granulocytes: 0 %
Lymphocytes Relative: 11 %
Lymphs Abs: 0.6 K/uL — ABNORMAL LOW (ref 0.7–4.0)
MCH: 29.6 pg (ref 26.0–34.0)
MCHC: 32.2 g/dL (ref 30.0–36.0)
MCV: 91.8 fL (ref 80.0–100.0)
Monocytes Absolute: 0.6 K/uL (ref 0.1–1.0)
Monocytes Relative: 10 %
Neutro Abs: 4.3 K/uL (ref 1.7–7.7)
Neutrophils Relative %: 78 %
Platelet Count: 207 K/uL (ref 150–400)
RBC: 3.18 MIL/uL — ABNORMAL LOW (ref 4.22–5.81)
RDW: 16.3 % — ABNORMAL HIGH (ref 11.5–15.5)
WBC Count: 5.5 K/uL (ref 4.0–10.5)
nRBC: 0 % (ref 0.0–0.2)

## 2024-08-16 LAB — CMP (CANCER CENTER ONLY)
ALT: 7 U/L (ref 0–44)
AST: 11 U/L — ABNORMAL LOW (ref 15–41)
Albumin: 3.5 g/dL (ref 3.5–5.0)
Alkaline Phosphatase: 52 U/L (ref 38–126)
Anion gap: 5 (ref 5–15)
BUN: 16 mg/dL (ref 8–23)
CO2: 28 mmol/L (ref 22–32)
Calcium: 9.5 mg/dL (ref 8.9–10.3)
Chloride: 107 mmol/L (ref 98–111)
Creatinine: 1.35 mg/dL — ABNORMAL HIGH (ref 0.61–1.24)
GFR, Estimated: 51 mL/min — ABNORMAL LOW (ref >60)
Glucose, Bld: 76 mg/dL (ref 70–99)
Potassium: 4.7 mmol/L (ref 3.5–5.1)
Sodium: 140 mmol/L (ref 135–145)
Total Bilirubin: 0.8 mg/dL (ref 0.0–1.2)
Total Protein: 7.3 g/dL (ref 6.5–8.1)

## 2024-08-16 LAB — SAMPLE TO BLOOD BANK

## 2024-08-16 LAB — TSH: TSH: 2.4 u[IU]/mL (ref 0.350–4.500)

## 2024-08-16 MED ORDER — SODIUM CHLORIDE 0.9 % IV SOLN
150.0000 mg | Freq: Once | INTRAVENOUS | Status: AC
  Administered 2024-08-16: 150 mg via INTRAVENOUS
  Filled 2024-08-16: qty 150

## 2024-08-16 MED ORDER — PALONOSETRON HCL INJECTION 0.25 MG/5ML
0.2500 mg | Freq: Once | INTRAVENOUS | Status: AC
  Administered 2024-08-16: 0.25 mg via INTRAVENOUS
  Filled 2024-08-16: qty 5

## 2024-08-16 MED ORDER — SODIUM CHLORIDE 0.9 % IV SOLN
242.4000 mg | Freq: Once | INTRAVENOUS | Status: AC
  Administered 2024-08-16: 240 mg via INTRAVENOUS
  Filled 2024-08-16: qty 24

## 2024-08-16 MED ORDER — SODIUM CHLORIDE 0.9 % IV SOLN: Freq: Once | INTRAVENOUS | Status: AC

## 2024-08-16 MED ORDER — DEXAMETHASONE SODIUM PHOSPHATE 10 MG/ML IJ SOLN
10.0000 mg | Freq: Once | INTRAMUSCULAR | Status: AC
  Administered 2024-08-16: 10 mg via INTRAVENOUS
  Filled 2024-08-16: qty 1

## 2024-08-16 MED ORDER — TRIAMCINOLONE ACETONIDE 0.025 % EX OINT: 1.0000 | TOPICAL_OINTMENT | Freq: Two times a day (BID) | CUTANEOUS | 0 refills | Status: DC

## 2024-08-16 MED ORDER — SODIUM CHLORIDE 0.9 % IV SOLN: INTRAVENOUS | Status: DC

## 2024-08-16 MED ORDER — SODIUM CHLORIDE 0.9 % IV SOLN
400.0000 mg/m2 | Freq: Once | INTRAVENOUS | Status: AC
  Administered 2024-08-16: 700 mg via INTRAVENOUS
  Filled 2024-08-16: qty 20

## 2024-08-16 MED ORDER — CYANOCOBALAMIN 1000 MCG/ML IJ SOLN
1000.0000 ug | Freq: Once | INTRAMUSCULAR | Status: AC
  Administered 2024-08-16: 1000 ug via INTRAMUSCULAR
  Filled 2024-08-16: qty 1

## 2024-08-16 NOTE — Patient Instructions (Signed)
 [facility] - A DEPT OF [place]  Discharge Instructions: Thank you for choosing [facility] to provide your oncology and hematology care.   If you have a lab appointment with the Cancer Center, please go directly to the Cancer Center and check in at the registration area.   Wear comfortable clothing and clothing appropriate for easy access to any Portacath or PICC line.   We strive to give you quality time with your provider. You may need to reschedule your appointment if you arrive late (15 or more minutes).  Arriving late affects you and other patients whose appointments are after yours.  Also, if you miss three or more appointments without notifying the office, you may be dismissed from the clinic at the provider's discretion.      For prescription refill requests, have your pharmacy contact our office and allow 72 hours for refills to be completed.    Today you received the following chemotherapy and/or immunotherapy agents: CARBOplatin  (PARAPLATIN ), PEMEtrexed  Disodium (ALIMTA )      To help prevent nausea and vomiting after your treatment, we encourage you to take your nausea medication as directed.  BELOW ARE SYMPTOMS THAT SHOULD BE REPORTED IMMEDIATELY: *FEVER GREATER THAN 100.4 F (38 C) OR HIGHER *CHILLS OR SWEATING *NAUSEA AND VOMITING THAT IS NOT CONTROLLED WITH YOUR NAUSEA MEDICATION *UNUSUAL SHORTNESS OF BREATH *UNUSUAL BRUISING OR BLEEDING *URINARY PROBLEMS (pain or burning when urinating, or frequent urination) *BOWEL PROBLEMS (unusual diarrhea, constipation, pain near the anus) TENDERNESS IN MOUTH AND THROAT WITH OR WITHOUT PRESENCE OF ULCERS (sore throat, sores in mouth, or a toothache) UNUSUAL RASH, SWELLING OR PAIN  UNUSUAL VAGINAL DISCHARGE OR ITCHING   Items with * indicate a potential emergency and should be followed up as soon as possible or go to the Emergency Department if any problems should occur.  Please show  the CHEMOTHERAPY ALERT CARD or IMMUNOTHERAPY ALERT CARD at check-in to the Emergency Department and triage nurse.  Should you have questions after your visit or need to cancel or reschedule your appointment, please contact [facility] - [facility] DE[facility]  Dept: [phone]  and follow the prompts.  Office hours are 8:00 a.m. to 4:30 p.m. Monday - Friday. Please note that voicemails left after 4:00 p.m. may not be returned until the following business day.  We are closed weekends and major holidays. You have access to a nurse at all times for urgent questions. Please call the main number to the clinic Dept: [phone] and follow the prompts.   For any non-urgent questions, you may also contact your provider using MyChart. We now offer e-Visits for anyone 56 and older to request care online for non-urgent symptoms. For details visit [email or web address].   Also download the MyChart app! Go to the app store, search MyChart, open the app, select [place], and log in with your MyChart username and password.

## 2024-08-17 LAB — T4: T4, Total: 7.3 ug/dL (ref 4.5–12.0)

## 2024-08-22 ENCOUNTER — Ambulatory Visit: Admitting: Internal Medicine

## 2024-08-22 ENCOUNTER — Other Ambulatory Visit

## 2024-08-22 ENCOUNTER — Inpatient Hospital Stay: Attending: Internal Medicine

## 2024-08-22 ENCOUNTER — Ambulatory Visit

## 2024-08-22 DIAGNOSIS — Z79630 Long term (current) use of alkylating agent: Secondary | ICD-10-CM | POA: Diagnosis not present

## 2024-08-22 DIAGNOSIS — I5022 Chronic systolic (congestive) heart failure: Secondary | ICD-10-CM | POA: Insufficient documentation

## 2024-08-22 DIAGNOSIS — Z8701 Personal history of pneumonia (recurrent): Secondary | ICD-10-CM | POA: Diagnosis not present

## 2024-08-22 DIAGNOSIS — N281 Cyst of kidney, acquired: Secondary | ICD-10-CM | POA: Insufficient documentation

## 2024-08-22 DIAGNOSIS — Z79631 Long term (current) use of antimetabolite agent: Secondary | ICD-10-CM | POA: Diagnosis not present

## 2024-08-22 DIAGNOSIS — Z860102 Personal history of hyperplastic colon polyps: Secondary | ICD-10-CM | POA: Diagnosis not present

## 2024-08-22 DIAGNOSIS — H548 Legal blindness, as defined in USA: Secondary | ICD-10-CM | POA: Diagnosis not present

## 2024-08-22 DIAGNOSIS — C3412 Malignant neoplasm of upper lobe, left bronchus or lung: Secondary | ICD-10-CM | POA: Insufficient documentation

## 2024-08-22 DIAGNOSIS — D6481 Anemia due to antineoplastic chemotherapy: Secondary | ICD-10-CM

## 2024-08-22 DIAGNOSIS — L299 Pruritus, unspecified: Secondary | ICD-10-CM | POA: Diagnosis not present

## 2024-08-22 DIAGNOSIS — N1832 Chronic kidney disease, stage 3b: Secondary | ICD-10-CM | POA: Diagnosis not present

## 2024-08-22 DIAGNOSIS — Z923 Personal history of irradiation: Secondary | ICD-10-CM | POA: Insufficient documentation

## 2024-08-22 DIAGNOSIS — I130 Hypertensive heart and chronic kidney disease with heart failure and stage 1 through stage 4 chronic kidney disease, or unspecified chronic kidney disease: Secondary | ICD-10-CM | POA: Diagnosis not present

## 2024-08-22 DIAGNOSIS — I739 Peripheral vascular disease, unspecified: Secondary | ICD-10-CM | POA: Diagnosis not present

## 2024-08-22 DIAGNOSIS — Z9049 Acquired absence of other specified parts of digestive tract: Secondary | ICD-10-CM | POA: Insufficient documentation

## 2024-08-22 DIAGNOSIS — Z5111 Encounter for antineoplastic chemotherapy: Secondary | ICD-10-CM | POA: Diagnosis present

## 2024-08-22 DIAGNOSIS — J984 Other disorders of lung: Secondary | ICD-10-CM | POA: Insufficient documentation

## 2024-08-22 DIAGNOSIS — N200 Calculus of kidney: Secondary | ICD-10-CM | POA: Diagnosis not present

## 2024-08-22 DIAGNOSIS — R590 Localized enlarged lymph nodes: Secondary | ICD-10-CM | POA: Insufficient documentation

## 2024-08-22 DIAGNOSIS — R519 Headache, unspecified: Secondary | ICD-10-CM | POA: Insufficient documentation

## 2024-08-22 DIAGNOSIS — I252 Old myocardial infarction: Secondary | ICD-10-CM | POA: Diagnosis not present

## 2024-08-22 DIAGNOSIS — Z87442 Personal history of urinary calculi: Secondary | ICD-10-CM | POA: Insufficient documentation

## 2024-08-22 DIAGNOSIS — I700 Atherosclerosis of aorta: Secondary | ICD-10-CM | POA: Diagnosis not present

## 2024-08-22 DIAGNOSIS — Z79899 Other long term (current) drug therapy: Secondary | ICD-10-CM | POA: Diagnosis not present

## 2024-08-22 DIAGNOSIS — J8410 Pulmonary fibrosis, unspecified: Secondary | ICD-10-CM | POA: Insufficient documentation

## 2024-08-22 DIAGNOSIS — Z9841 Cataract extraction status, right eye: Secondary | ICD-10-CM | POA: Insufficient documentation

## 2024-08-22 DIAGNOSIS — Z7901 Long term (current) use of anticoagulants: Secondary | ICD-10-CM | POA: Insufficient documentation

## 2024-08-22 DIAGNOSIS — I2510 Atherosclerotic heart disease of native coronary artery without angina pectoris: Secondary | ICD-10-CM | POA: Diagnosis not present

## 2024-08-22 DIAGNOSIS — Z9221 Personal history of antineoplastic chemotherapy: Secondary | ICD-10-CM | POA: Insufficient documentation

## 2024-08-22 DIAGNOSIS — Z9842 Cataract extraction status, left eye: Secondary | ICD-10-CM | POA: Insufficient documentation

## 2024-08-22 LAB — CMP (CANCER CENTER ONLY)
ALT: 7 U/L (ref 0–44)
AST: 14 U/L — ABNORMAL LOW (ref 15–41)
Albumin: 3.5 g/dL (ref 3.5–5.0)
Alkaline Phosphatase: 49 U/L (ref 38–126)
Anion gap: 6 (ref 5–15)
BUN: 25 mg/dL — ABNORMAL HIGH (ref 8–23)
CO2: 25 mmol/L (ref 22–32)
Calcium: 8.7 mg/dL — ABNORMAL LOW (ref 8.9–10.3)
Chloride: 103 mmol/L (ref 98–111)
Creatinine: 1.46 mg/dL — ABNORMAL HIGH (ref 0.61–1.24)
GFR, Estimated: 47 mL/min — ABNORMAL LOW (ref >60)
Glucose, Bld: 91 mg/dL (ref 70–99)
Potassium: 3.9 mmol/L (ref 3.5–5.1)
Sodium: 134 mmol/L — ABNORMAL LOW (ref 135–145)
Total Bilirubin: 0.7 mg/dL (ref 0.0–1.2)
Total Protein: 6.9 g/dL (ref 6.5–8.1)

## 2024-08-22 LAB — CBC WITH DIFFERENTIAL (CANCER CENTER ONLY)
Abs Immature Granulocytes: 0 K/uL (ref 0.00–0.07)
Basophils Absolute: 0 K/uL (ref 0.0–0.1)
Basophils Relative: 1 %
Eosinophils Absolute: 0 K/uL (ref 0.0–0.5)
Eosinophils Relative: 2 %
HCT: 27.5 % — ABNORMAL LOW (ref 39.0–52.0)
Hemoglobin: 9.1 g/dL — ABNORMAL LOW (ref 13.0–17.0)
Immature Granulocytes: 0 %
Lymphocytes Relative: 27 %
Lymphs Abs: 0.3 K/uL — ABNORMAL LOW (ref 0.7–4.0)
MCH: 29.9 pg (ref 26.0–34.0)
MCHC: 33.1 g/dL (ref 30.0–36.0)
MCV: 90.5 fL (ref 80.0–100.0)
Monocytes Absolute: 0.1 K/uL (ref 0.1–1.0)
Monocytes Relative: 6 %
Neutro Abs: 0.8 K/uL — ABNORMAL LOW (ref 1.7–7.7)
Neutrophils Relative %: 64 %
Platelet Count: 142 K/uL — ABNORMAL LOW (ref 150–400)
RBC: 3.04 MIL/uL — ABNORMAL LOW (ref 4.22–5.81)
RDW: 16 % — ABNORMAL HIGH (ref 11.5–15.5)
Smear Review: NORMAL
WBC Count: 1.2 K/uL — ABNORMAL LOW (ref 4.0–10.5)
nRBC: 0 % (ref 0.0–0.2)

## 2024-08-22 LAB — SAMPLE TO BLOOD BANK

## 2024-08-27 ENCOUNTER — Ambulatory Visit: Admitting: Internal Medicine

## 2024-08-27 ENCOUNTER — Other Ambulatory Visit

## 2024-08-27 ENCOUNTER — Ambulatory Visit

## 2024-08-27 NOTE — Progress Notes (Signed)
 Remote ICD Transmission

## 2024-08-29 ENCOUNTER — Inpatient Hospital Stay

## 2024-08-29 ENCOUNTER — Ambulatory Visit (HOSPITAL_COMMUNITY)
Admission: RE | Admit: 2024-08-29 | Discharge: 2024-08-29 | Disposition: A | Discharge status: Home / Self Care | Source: Ambulatory Visit | Attending: Physician Assistant | Admitting: Physician Assistant

## 2024-08-29 ENCOUNTER — Other Ambulatory Visit: Payer: Self-pay | Admitting: Physician Assistant

## 2024-08-29 ENCOUNTER — Telehealth: Payer: Self-pay

## 2024-08-29 ENCOUNTER — Other Ambulatory Visit: Payer: Self-pay

## 2024-08-29 DIAGNOSIS — D6481 Anemia due to antineoplastic chemotherapy: Secondary | ICD-10-CM

## 2024-08-29 DIAGNOSIS — C3412 Malignant neoplasm of upper lobe, left bronchus or lung: Secondary | ICD-10-CM

## 2024-08-29 DIAGNOSIS — C3490 Malignant neoplasm of unspecified part of unspecified bronchus or lung: Secondary | ICD-10-CM | POA: Insufficient documentation

## 2024-08-29 DIAGNOSIS — Z5111 Encounter for antineoplastic chemotherapy: Secondary | ICD-10-CM | POA: Diagnosis not present

## 2024-08-29 LAB — CMP (CANCER CENTER ONLY)
ALT: 7 U/L (ref 0–44)
AST: 14 U/L — ABNORMAL LOW (ref 15–41)
Albumin: 3.4 g/dL — ABNORMAL LOW (ref 3.5–5.0)
Alkaline Phosphatase: 48 U/L (ref 38–126)
Anion gap: 5 (ref 5–15)
BUN: 16 mg/dL (ref 8–23)
CO2: 25 mmol/L (ref 22–32)
Calcium: 9.1 mg/dL (ref 8.9–10.3)
Chloride: 106 mmol/L (ref 98–111)
Creatinine: 1.17 mg/dL (ref 0.61–1.24)
GFR, Estimated: >60 mL/min (ref >60)
Glucose, Bld: 89 mg/dL (ref 70–99)
Potassium: 3.9 mmol/L (ref 3.5–5.1)
Sodium: 136 mmol/L (ref 135–145)
Total Bilirubin: 0.6 mg/dL (ref 0.0–1.2)
Total Protein: 7.1 g/dL (ref 6.5–8.1)

## 2024-08-29 LAB — CBC WITH DIFFERENTIAL (CANCER CENTER ONLY)
Abs Immature Granulocytes: 0.01 K/uL (ref 0.00–0.07)
Basophils Absolute: 0 K/uL (ref 0.0–0.1)
Basophils Relative: 0 %
Eosinophils Absolute: 0 K/uL (ref 0.0–0.5)
Eosinophils Relative: 1 %
HCT: 23.6 % — ABNORMAL LOW (ref 39.0–52.0)
Hemoglobin: 7.8 g/dL — ABNORMAL LOW (ref 13.0–17.0)
Immature Granulocytes: 1 %
Lymphocytes Relative: 19 %
Lymphs Abs: 0.4 K/uL — ABNORMAL LOW (ref 0.7–4.0)
MCH: 29.9 pg (ref 26.0–34.0)
MCHC: 33.1 g/dL (ref 30.0–36.0)
MCV: 90.4 fL (ref 80.0–100.0)
Monocytes Absolute: 0.4 K/uL (ref 0.1–1.0)
Monocytes Relative: 20 %
Neutro Abs: 1.3 K/uL — ABNORMAL LOW (ref 1.7–7.7)
Neutrophils Relative %: 59 %
Platelet Count: 58 K/uL — ABNORMAL LOW (ref 150–400)
RBC: 2.61 MIL/uL — ABNORMAL LOW (ref 4.22–5.81)
RDW: 16.6 % — ABNORMAL HIGH (ref 11.5–15.5)
WBC Count: 2.2 K/uL — ABNORMAL LOW (ref 4.0–10.5)
nRBC: 0 % (ref 0.0–0.2)

## 2024-08-29 LAB — PREPARE RBC (CROSSMATCH)

## 2024-08-29 NOTE — Telephone Encounter (Signed)
 Pt aware to keep blue bracelet on for Friday appt.

## 2024-08-29 NOTE — Telephone Encounter (Signed)
 Called pt Pt hgb low and Cassie, PA would like to give him 1U RBCs, we can schedule it 08/31/24 at 0800. Pt wife aware of appt and agreeable to have blood on Friday. Reminded to keep blue bracelet on for Monday appts.

## 2024-08-31 ENCOUNTER — Other Ambulatory Visit: Payer: Self-pay

## 2024-08-31 ENCOUNTER — Inpatient Hospital Stay

## 2024-08-31 DIAGNOSIS — D649 Anemia, unspecified: Secondary | ICD-10-CM

## 2024-08-31 DIAGNOSIS — D6481 Anemia due to antineoplastic chemotherapy: Secondary | ICD-10-CM

## 2024-08-31 DIAGNOSIS — Z5111 Encounter for antineoplastic chemotherapy: Secondary | ICD-10-CM | POA: Diagnosis not present

## 2024-08-31 DIAGNOSIS — C3412 Malignant neoplasm of upper lobe, left bronchus or lung: Secondary | ICD-10-CM

## 2024-08-31 LAB — PREPARE RBC (CROSSMATCH)

## 2024-08-31 LAB — BPAM RBC
Blood Product Expiration Date: 202511012359
Unit Type and Rh: 5100

## 2024-08-31 LAB — CBC WITH DIFFERENTIAL (CANCER CENTER ONLY)
Abs Immature Granulocytes: 0.02 K/uL (ref 0.00–0.07)
Basophils Absolute: 0 K/uL (ref 0.0–0.1)
Basophils Relative: 0 %
Eosinophils Absolute: 0 K/uL (ref 0.0–0.5)
Eosinophils Relative: 1 %
HCT: 23.4 % — ABNORMAL LOW (ref 39.0–52.0)
Hemoglobin: 7.7 g/dL — ABNORMAL LOW (ref 13.0–17.0)
Immature Granulocytes: 1 %
Lymphocytes Relative: 16 %
Lymphs Abs: 0.4 K/uL — ABNORMAL LOW (ref 0.7–4.0)
MCH: 30 pg (ref 26.0–34.0)
MCHC: 32.9 g/dL (ref 30.0–36.0)
MCV: 91.1 fL (ref 80.0–100.0)
Monocytes Absolute: 0.5 K/uL (ref 0.1–1.0)
Monocytes Relative: 20 %
Neutro Abs: 1.6 K/uL — ABNORMAL LOW (ref 1.7–7.7)
Neutrophils Relative %: 62 %
Platelet Count: 70 K/uL — ABNORMAL LOW (ref 150–400)
RBC: 2.57 MIL/uL — ABNORMAL LOW (ref 4.22–5.81)
RDW: 17.2 % — ABNORMAL HIGH (ref 11.5–15.5)
WBC Count: 2.6 K/uL — ABNORMAL LOW (ref 4.0–10.5)
nRBC: 0 % (ref 0.0–0.2)

## 2024-08-31 LAB — TYPE AND SCREEN
ABO/RH(D): B POS
Antibody Screen: NEGATIVE
Unit division: 0

## 2024-08-31 LAB — CMP (CANCER CENTER ONLY)
ALT: 7 U/L (ref 0–44)
AST: 12 U/L — ABNORMAL LOW (ref 15–41)
Albumin: 3.3 g/dL — ABNORMAL LOW (ref 3.5–5.0)
Alkaline Phosphatase: 45 U/L (ref 38–126)
Anion gap: 5 (ref 5–15)
BUN: 15 mg/dL (ref 8–23)
CO2: 26 mmol/L (ref 22–32)
Calcium: 9 mg/dL (ref 8.9–10.3)
Chloride: 107 mmol/L (ref 98–111)
Creatinine: 1.1 mg/dL (ref 0.61–1.24)
GFR, Estimated: >60 mL/min (ref >60)
Glucose, Bld: 95 mg/dL (ref 70–99)
Potassium: 3.9 mmol/L (ref 3.5–5.1)
Sodium: 138 mmol/L (ref 135–145)
Total Bilirubin: 0.5 mg/dL (ref 0.0–1.2)
Total Protein: 6.7 g/dL (ref 6.5–8.1)

## 2024-08-31 LAB — SAMPLE TO BLOOD BANK

## 2024-08-31 MED ORDER — SODIUM CHLORIDE 0.9% IV SOLUTION: 250.0000 mL | INTRAVENOUS | Status: DC

## 2024-08-31 MED ORDER — CETIRIZINE HCL 10 MG PO TABS
10.0000 mg | ORAL_TABLET | Freq: Once | ORAL | Status: AC
  Administered 2024-08-31: 10 mg via ORAL
  Filled 2024-08-31: qty 1

## 2024-08-31 MED ORDER — ACETAMINOPHEN 325 MG PO TABS
650.0000 mg | ORAL_TABLET | Freq: Once | ORAL | Status: AC
  Administered 2024-08-31: 650 mg via ORAL
  Filled 2024-08-31: qty 2

## 2024-08-31 NOTE — Addendum Note (Signed)
 Addended by: MUNRO, Camira Geidel N on: 08/31/2024 10:45 AM   Modules accepted: Orders

## 2024-08-31 NOTE — Progress Notes (Signed)
 RN noted patient did not have blue blood bank bracelet on when prepping to get blood from blood bank. Patient states he took it off and threw it away. He was aware he needed to leave it on, but simply forgot. Made desk RN aware. Patient rescheduled for Monday at 1pm to get blood. New order set to be placed by desk RN. Patient sent to lab for redraw. Instructed to leave blue bracelet on until blood product has been given. Patient aware of new appts and agreeable to plan. Instructed to go to ED for chest pain, shortness of breath, or extreme fatigue. Pt and family member verbalize understanding.

## 2024-08-31 NOTE — Addendum Note (Signed)
 Addended by: MUNRO, Jymir Dunaj N on: 08/31/2024 09:15 AM   Modules accepted: Orders

## 2024-09-03 ENCOUNTER — Other Ambulatory Visit: Payer: Self-pay | Admitting: Medical Oncology

## 2024-09-03 ENCOUNTER — Inpatient Hospital Stay

## 2024-09-03 DIAGNOSIS — T451X5A Adverse effect of antineoplastic and immunosuppressive drugs, initial encounter: Secondary | ICD-10-CM

## 2024-09-03 DIAGNOSIS — Z5111 Encounter for antineoplastic chemotherapy: Secondary | ICD-10-CM | POA: Diagnosis not present

## 2024-09-03 MED ORDER — ACETAMINOPHEN 325 MG PO TABS
650.0000 mg | ORAL_TABLET | Freq: Once | ORAL | Status: AC
  Administered 2024-09-03: 650 mg via ORAL
  Filled 2024-09-03: qty 2

## 2024-09-03 MED ORDER — ACETAMINOPHEN 325 MG PO TABS: 650.0000 mg | ORAL_TABLET | Freq: Once | ORAL | Status: DC

## 2024-09-03 MED ORDER — CETIRIZINE HCL 10 MG PO TABS
10.0000 mg | ORAL_TABLET | Freq: Once | ORAL | Status: AC
  Administered 2024-09-03: 10 mg via ORAL
  Filled 2024-09-03: qty 1

## 2024-09-03 MED ORDER — SODIUM CHLORIDE 0.9% IV SOLUTION
250.0000 mL | INTRAVENOUS | Status: DC
  Administered 2024-09-03: 250 mL via INTRAVENOUS

## 2024-09-03 NOTE — Patient Instructions (Signed)

## 2024-09-04 LAB — BPAM RBC
Blood Product Expiration Date: 202511012359
ISSUE DATE / TIME: 202510131437
Unit Type and Rh: 5100

## 2024-09-04 LAB — TYPE AND SCREEN
ABO/RH(D): B POS
Antibody Screen: NEGATIVE
Unit division: 0

## 2024-09-05 ENCOUNTER — Encounter: Payer: Self-pay | Admitting: Internal Medicine

## 2024-09-05 MED FILL — Fosaprepitant Dimeglumine For IV Infusion 150 MG (Base Eq): INTRAVENOUS | Qty: 5 | Status: AC

## 2024-09-06 ENCOUNTER — Inpatient Hospital Stay

## 2024-09-06 ENCOUNTER — Inpatient Hospital Stay (HOSPITAL_BASED_OUTPATIENT_CLINIC_OR_DEPARTMENT_OTHER): Admitting: Internal Medicine

## 2024-09-06 VITALS — BP 136/84 | HR 88 | Temp 97.3°F | Resp 17 | Ht 67.0 in | Wt 148.3 lb

## 2024-09-06 DIAGNOSIS — C3412 Malignant neoplasm of upper lobe, left bronchus or lung: Secondary | ICD-10-CM

## 2024-09-06 DIAGNOSIS — T451X5A Adverse effect of antineoplastic and immunosuppressive drugs, initial encounter: Secondary | ICD-10-CM

## 2024-09-06 DIAGNOSIS — Z5111 Encounter for antineoplastic chemotherapy: Secondary | ICD-10-CM | POA: Diagnosis not present

## 2024-09-06 LAB — CMP (CANCER CENTER ONLY)
ALT: 6 U/L (ref 0–44)
AST: 14 U/L — ABNORMAL LOW (ref 15–41)
Albumin: 3.4 g/dL — ABNORMAL LOW (ref 3.5–5.0)
Alkaline Phosphatase: 47 U/L (ref 38–126)
Anion gap: 5 (ref 5–15)
BUN: 17 mg/dL (ref 8–23)
CO2: 26 mmol/L (ref 22–32)
Calcium: 9.6 mg/dL (ref 8.9–10.3)
Chloride: 107 mmol/L (ref 98–111)
Creatinine: 1.11 mg/dL (ref 0.61–1.24)
GFR, Estimated: >60 mL/min (ref >60)
Glucose, Bld: 103 mg/dL — ABNORMAL HIGH (ref 70–99)
Potassium: 4 mmol/L (ref 3.5–5.1)
Sodium: 138 mmol/L (ref 135–145)
Total Bilirubin: 0.7 mg/dL (ref 0.0–1.2)
Total Protein: 6.8 g/dL (ref 6.5–8.1)

## 2024-09-06 LAB — CBC WITH DIFFERENTIAL (CANCER CENTER ONLY)
Abs Immature Granulocytes: 0.02 K/uL (ref 0.00–0.07)
Basophils Absolute: 0 K/uL (ref 0.0–0.1)
Basophils Relative: 0 %
Eosinophils Absolute: 0.1 K/uL (ref 0.0–0.5)
Eosinophils Relative: 2 %
HCT: 27.8 % — ABNORMAL LOW (ref 39.0–52.0)
Hemoglobin: 9 g/dL — ABNORMAL LOW (ref 13.0–17.0)
Immature Granulocytes: 1 %
Lymphocytes Relative: 15 %
Lymphs Abs: 0.5 K/uL — ABNORMAL LOW (ref 0.7–4.0)
MCH: 30.4 pg (ref 26.0–34.0)
MCHC: 32.4 g/dL (ref 30.0–36.0)
MCV: 93.9 fL (ref 80.0–100.0)
Monocytes Absolute: 0.5 K/uL (ref 0.1–1.0)
Monocytes Relative: 15 %
Neutro Abs: 2.2 K/uL (ref 1.7–7.7)
Neutrophils Relative %: 67 %
Platelet Count: 159 K/uL (ref 150–400)
RBC: 2.96 MIL/uL — ABNORMAL LOW (ref 4.22–5.81)
RDW: 18.3 % — ABNORMAL HIGH (ref 11.5–15.5)
WBC Count: 3.2 K/uL — ABNORMAL LOW (ref 4.0–10.5)
nRBC: 0 % (ref 0.0–0.2)

## 2024-09-06 LAB — SAMPLE TO BLOOD BANK

## 2024-09-06 MED ORDER — SODIUM CHLORIDE 0.9 % IV SOLN
400.0000 mg/m2 | Freq: Once | INTRAVENOUS | Status: AC
  Administered 2024-09-06: 700 mg via INTRAVENOUS
  Filled 2024-09-06: qty 20

## 2024-09-06 MED ORDER — SODIUM CHLORIDE 0.9 % IV SOLN
242.4000 mg | Freq: Once | INTRAVENOUS | Status: AC
  Administered 2024-09-06: 240 mg via INTRAVENOUS
  Filled 2024-09-06: qty 24

## 2024-09-06 MED ORDER — SODIUM CHLORIDE 0.9 % IV SOLN
150.0000 mg | Freq: Once | INTRAVENOUS | Status: AC
  Administered 2024-09-06: 150 mg via INTRAVENOUS
  Filled 2024-09-06: qty 150

## 2024-09-06 MED ORDER — SODIUM CHLORIDE 0.9 % IV SOLN: INTRAVENOUS | Status: DC

## 2024-09-06 MED ORDER — PALONOSETRON HCL INJECTION 0.25 MG/5ML
0.2500 mg | Freq: Once | INTRAVENOUS | Status: AC
  Administered 2024-09-06: 0.25 mg via INTRAVENOUS
  Filled 2024-09-06: qty 5

## 2024-09-06 MED ORDER — DEXAMETHASONE SOD PHOSPHATE PF 10 MG/ML IJ SOLN
10.0000 mg | Freq: Once | INTRAMUSCULAR | Status: AC
  Administered 2024-09-06: 10 mg via INTRAVENOUS

## 2024-09-06 NOTE — Patient Instructions (Signed)
 [facility] - A DEPT OF [place]  Discharge Instructions: Thank you for choosing [facility] to provide your oncology and hematology care.   If you have a lab appointment with the Cancer Center, please go directly to the Cancer Center and check in at the registration area.   Wear comfortable clothing and clothing appropriate for easy access to any Portacath or PICC line.   We strive to give you quality time with your provider. You may need to reschedule your appointment if you arrive late (15 or more minutes).  Arriving late affects you and other patients whose appointments are after yours.  Also, if you miss three or more appointments without notifying the office, you may be dismissed from the clinic at the provider's discretion.      For prescription refill requests, have your pharmacy contact our office and allow 72 hours for refills to be completed.    Today you received the following chemotherapy and/or immunotherapy agents: Pemetrexed , Carboplatin       To help prevent nausea and vomiting after your treatment, we encourage you to take your nausea medication as directed.  BELOW ARE SYMPTOMS THAT SHOULD BE REPORTED IMMEDIATELY: *FEVER GREATER THAN 100.4 F (38 C) OR HIGHER *CHILLS OR SWEATING *NAUSEA AND VOMITING THAT IS NOT CONTROLLED WITH YOUR NAUSEA MEDICATION *UNUSUAL SHORTNESS OF BREATH *UNUSUAL BRUISING OR BLEEDING *URINARY PROBLEMS (pain or burning when urinating, or frequent urination) *BOWEL PROBLEMS (unusual diarrhea, constipation, pain near the anus) TENDERNESS IN MOUTH AND THROAT WITH OR WITHOUT PRESENCE OF ULCERS (sore throat, sores in mouth, or a toothache) UNUSUAL RASH, SWELLING OR PAIN  UNUSUAL VAGINAL DISCHARGE OR ITCHING   Items with * indicate a potential emergency and should be followed up as soon as possible or go to the Emergency Department if any problems should occur.  Please show the CHEMOTHERAPY ALERT CARD or  IMMUNOTHERAPY ALERT CARD at check-in to the Emergency Department and triage nurse.  Should you have questions after your visit or need to cancel or reschedule your appointment, please contact [facility]  Dept: [phone]  and follow the prompts.  Office hours are 8:00 a.m. to 4:30 p.m. Monday - Friday. Please note that voicemails left after 4:00 p.m. may not be returned until the following business day.  We are closed weekends and major holidays. You have access to a nurse at all times for urgent questions. Please call the main number to the clinic Dept: [phone] and follow the prompts.   For any non-urgent questions, you may also contact your provider using MyChart. We now offer e-Visits for anyone 3 and older to request care online for non-urgent symptoms. For details visit [email or web address].   Also download the MyChart app! Go to the app store, search MyChart, open the app, select [place], and log in with your MyChart username and password.

## 2024-09-06 NOTE — Progress Notes (Signed)
 Continue with carboplatin  240 mg per Dr. Sherrod given patient's older age.  Harlene Nasuti, PharmD Oncology Infusion Pharmacist 09/06/2024 9:23 AM

## 2024-09-06 NOTE — Progress Notes (Signed)
 [facility] [phone]   [phone]  OFFICE PROGRESS NOTE  Rodney Aland, MD [address], [address] [place] [address]  DIAGNOSIS: Stage IIIC/IV (T3, N3, M0) non-small cell lung cancer, adenocarcinoma. He presented with a left upper lobe spiculated mass in addition to lingular lesion and suspicious mediastinal lymphadenopathy He also has bilateral hypermetabolic axillary lymph nodes which could be related to metastatic disease versus inflammatory as the patient did have vaccines in both of his arms the week prior to his PET scan.  This was diagnosed in August 2024.   Molecular Studies: His PD-L1 expression was 1% and he has no actionable mutations.   PRIOR THERAPY:  1) Concurrent chemoradiation with carboplatin  for an AUC of 2 and paclitaxel  45 mg/m.  First dose expected on 08/15/23.  Status post 5 cycles of first cycle was given with carboplatin  and paclitaxel  and starting from cycle #2 he was on carboplatin  and Abraxane  secondary to hypersensitivity reaction to paclitaxel . 2) Consolidation immunotherapy with Imfinzi  1500 Mg IV every 4 weeks.  First dose November 28, 2023.  Status post 2 cycles.  Discontinued secondary to intolerance and patient is requested.   CURRENT THERAPY: First-line systemic chemotherapy with carboplatin  for AUC of 4, Alimta  400 Mg/M2 and Keytruda  200 Mg IV every 3 weeks.  First dose June 20, 2024.  Starting cycle #2 Keytruda  will be discontinued secondary to intolerance with significant itching and patient's preference.  Status post 3 cycles.  INTERVAL HISTORY: PHILIPPE GANG 83 y.o. male returns to the clinic today for follow-up visit. Discussed the use of AI scribe software for clinical note transcription with the patient, who gave verbal consent to proceed.  History of Present Illness [name] is an 83 year old male with recurrent non-small cell lung cancer who presents for evaluation and restaging of his disease.  He  has a history of recurrent non-small cell lung cancer, initially diagnosed as stage four in August 2024, with no actionable mutation and a PD-L1 expression of one percent.  He began palliative chemotherapy with carboplatin , Alimta , and Keytruda  every three weeks in July 2025. Keytruda  was discontinued starting from cycle number two due to intolerance, specifically itching, and patient preference.  He has completed three cycles of chemotherapy with carboplatin  and Alimta . No itching or rash since discontinuing Keytruda . He experiences occasional headaches and feels generally unwell. No nausea, vomiting, diarrhea, or weight loss. He maintains a good appetite.  A recent CT scan of the chest and abdomen was performed for restaging.      MEDICAL HISTORY: Past Medical History:  Diagnosis Date   AICD (automatic cardioverter/defibrillator) present    CAD (coronary artery disease) 80% stenosis diag of the LAD, 30% in OM2 branch of LCX in 2009    a. Nonobstructive CAD by cath 11/2011 with the exception of the pre-existing diagonal branch #2 lesion.   Chronic systolic CHF (congestive heart failure) (HCC)    CKD (chronic kidney disease) stage 3, GFR 30-59 ml/min (HCC)    Colon polyp, hyperplastic    History of kidney stones    History of radiation therapy    Left Lung- 08/17/23-09/29/23- Dr. Lynwood Nasuti   History of stress test 06/01/2012   Normal myocardial perfusion study. compared to the previous study there is no significant change. this is a low risk scan   Hypertension    Legally blind    both eyes   Myocardial infarction (HCC) 11/22/2011   NICM (nonischemic  cardiomyopathy) (HCC)    a. Remote hx of dilated NICM with EF ranging 20-45%, including normal EF by echo (55-60%) in 2014.   Peripheral arterial disease    a. 06/2014: ABI right 0.99, left 1.2, LE dopplers revealing an occluded right posterior tibial. As symptoms were not felt r/t claudication, no further w/u at the time.    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 Requiring discontinuation of BB dose.   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  MEDICATIONS:  Current Outpatient Medications  Medication Sig Dispense Refill   apixaban  (ELIQUIS ) 2.5 MG TABS tablet Take 1 tablet (2.5 mg total) by mouth 2 (two) times daily. 180 tablet 1   BREO ELLIPTA  100-25 MCG/INH AEPB Inhale 1 puff into the lungs every morning.     brimonidine  (ALPHAGAN ) 0.2 % ophthalmic solution Place 1 drop into both eyes 2 (two) times daily.     Continuous Glucose Sensor (FREESTYLE LIBRE 3 PLUS SENSOR) MISC SMARTSIG:Topical Every 3 Months     cycloSPORINE  (RESTASIS ) 0.05 % ophthalmic emulsion Place 1 drop into both eyes 2 (two) times daily.     dorzolamide -timolol  (COSOPT ) 22.3-6.8 MG/ML ophthalmic solution Place 1 drop into both eyes 2 (two) times daily.     doxycycline  (VIBRAMYCIN ) 100 MG capsule Take 1 capsule (100 mg total) by mouth 2 (two) times daily. 20 capsule 0   ENTRESTO  24-26 MG TAKE 1 TABLET BY MOUTH TWICE DAILY 180 tablet 3   folic acid  (FOLVITE ) 1 MG tablet Take 1 tablet (1 mg total) by mouth daily. Start 7 days before pemetrexed  chemotherapy. Continue until 21 days after pemetrexed  completed. 100 tablet 3   furosemide  (LASIX ) 20 MG tablet Take 0.5 tablets (10 mg total) by mouth daily. 30 tablet 1   Glucagon , rDNA, (GLUCAGON  EMERGENCY) 1 MG KIT Inject 1 mg into the skin as needed for up to 2 doses (Severe low blood sugar). 1 kit 0   hydrocortisone  1 % lotion Apply 1 Application topically 2 (two) times daily. 113 g 0   hydrOXYzine  (ATARAX ) 25 MG tablet Take 1 tablet (25 mg total) by mouth every 6 (six) hours as needed for itching. 12 tablet 0   insulin  isophane & regular human KwikPen (HUMULIN 70/30 MIX) (70-30)  100 UNIT/ML KwikPen Inject 25 Units into the skin in the morning and at bedtime. (Patient taking differently: Inject 5 Units into the skin daily at 6 (six) AM.) 15 mL 0   lidocaine  (LIDODERM ) 5 % Place 1 patch onto the skin daily. Remove & Discard patch within 12 hours or as directed by MD 30 patch 0   metoprolol  succinate (TOPROL -XL) 25 MG 24 hr tablet Take 1 tablet (25 mg total) by mouth daily. 90 tablet 3   omeprazole  (PRILOSEC) 20 MG capsule Take 1 capsule (20 mg total) by mouth daily. 30 capsule 2   ondansetron  (ZOFRAN ) 8 MG tablet Take 1 tablet (8 mg total) by mouth every 8 (eight) hours as needed for nausea or vomiting. Start on the third day after carboplatin . (Patient not taking: Reported on 08/02/2024) 30 tablet 1   ROCKLATAN  0.02-0.005 % SOLN Place 1 drop into both eyes at bedtime.     spironolactone  (ALDACTONE ) 25 MG tablet Take 12.5 mg by mouth daily.     triamcinolone  (KENALOG ) 0.025 %  ointment Apply 1 Application topically 2 (two) times daily. 15 g 0   VYZULTA 0.024 % SOLN Apply 1 drop to eye at bedtime.     No current facility-administered medications for this visit.    SURGICAL HISTORY:  Past Surgical History:  Procedure Laterality Date   BACK SURGERY     BIV UPGRADE N/A 11/08/2019   Procedure: BIV UPGRADE;  Surgeon: Waddell Danelle ORN, MD;  Location: [facility] INVASIVE CV LAB;  Service: Cardiovascular;  Laterality: N/A;   BRONCHIAL BIOPSY  07/05/2023   Procedure: BRONCHIAL BIOPSIES;  Surgeon: Shelah Lamar RAMAN, MD;  Location: [facility] ENDOSCOPY;  Service: Pulmonary;;   BRONCHIAL BRUSHINGS  07/05/2023   Procedure: BRONCHIAL BRUSHINGS;  Surgeon: Shelah Lamar RAMAN, MD;  Location: [facility] ENDOSCOPY;  Service: Pulmonary;;   BRONCHIAL NEEDLE ASPIRATION BIOPSY  07/05/2023   Procedure: BRONCHIAL NEEDLE ASPIRATION BIOPSIES;  Surgeon: Shelah Lamar RAMAN, MD;  Location: [facility] ENDOSCOPY;  Service: Pulmonary;;   BRONCHIAL WASHINGS  07/05/2023   Procedure: BRONCHIAL WASHINGS;  Surgeon: Shelah Lamar RAMAN, MD;  Location: [facility]  ENDOSCOPY;  Service: Pulmonary;;   CARDIAC CATHETERIZATION  11/2011   CARDIAC CATHETERIZATION  11/2011   didn't demonstrate high grade obstructive disease to account for his LV dysfunction.   CATARACT EXTRACTION, BILATERAL  1990's   CYSTOSCOPY     CYSTOSCOPY WITH BIOPSY Bilateral 08/03/2021   Procedure: CYSTOSCOPY WITH BLADDER BIOPSY, BILATERAL RETROGRADE PYELOGRAM;  Surgeon: Carolee Sherwood JONETTA DOUGLAS, MD;  Location: [facility] ORS;  Service: Urology;  Laterality: Bilateral;  REQUESTING 45 MINS   CYSTOSCOPY WITH URETHRAL DILATATION N/A 05/04/2013   Procedure: CYSTOSCOPY WITH URETHRAL DILATATION;  Surgeon: Alm RAMAN Molt, MD;  Location: [facility] NEURO ORS;  Service: Neurosurgery;  Laterality: N/A;  with insertion of foley catheter   EP study and ablation of VT  7/13   PVC focus mapped to the right coronary cusp of the aorta, limited ablation performed due to proximity of the focus to the right coronary artery   EYE SURGERY     FINE NEEDLE ASPIRATION  07/05/2023   Procedure: FINE NEEDLE ASPIRATION (FNA) LINEAR;  Surgeon: Shelah Lamar RAMAN, MD;  Location: [facility] ENDOSCOPY;  Service: Pulmonary;;   LEFT HEART CATH AND CORONARY ANGIOGRAPHY N/A 11/07/2019   Procedure: LEFT HEART CATH AND CORONARY ANGIOGRAPHY;  Surgeon: Darron Deatrice LABOR, MD;  Location: [facility] INVAS[facility] CV LAB;  Service: Cardiovascular;  Laterality: N/A;   LEFT HEART CATHETERIZATION WITH CORONARY ANGIOGRAM N/A 11/25/2011   Procedure: LEFT HEART CATHETERIZATION WITH CORONARY ANGIOGRAM;  Surgeon: Alm ORN Clay, MD;  Location: [facility] CATH LAB;  Service: Cardiovascular;  Laterality: N/A;   PACEMAKER IMPLANT N/A 07/12/2018   Procedure: PACEMAKER IMPLANT;  Surgeon: Francyne Headland, MD;  Location: [facility] INVASIVE CV LAB;  Service: Cardiovascular;  Laterality: N/A;   POSTERIOR FUSION LUMBAR SPINE  1979   ROTATOR CUFF REPAIR  2000's   left   V-TACH ABLATION N/A 06/06/2012   Procedure: V-TACH ABLATION;  Surgeon: Lynwood Rakers, MD;  Location: [facility] CATH LAB;  Service: Cardiovascular;   Laterality: N/A;   VIDEO BRONCHOSCOPY WITH ENDOBRONCHIAL ULTRASOUND N/A 07/05/2023   Procedure: VIDEO BRONCHOSCOPY WITH ENDOBRONCHIAL ULTRASOUND;  Surgeon: Shelah Lamar RAMAN, MD;  Location: [facility] ENDOSCOPY;  Service: Pulmonary;  Laterality: N/A;    REVIEW OF SYSTEMS:  Constitutional: positive for fatigue Eyes: negative Ears, nose, mouth, throat, and face: negative Respiratory: positive for dyspnea on exertion Cardiovascular: negative Gastrointestinal: negative Genitourinary:negative Integument/breast: negative Hematologic/lymphatic: negative Musculoskeletal:negative Neurological: negative Behavioral/Psych: negative Endocrine: negative Allergic/Immunologic: negative   PHYSICAL EXAMINATION: General appearance:  alert, cooperative, fatigued, and no distress Head: Normocephalic, without obvious abnormality, atraumatic Neck: no adenopathy, no JVD, supple, symmetrical, trachea midline, and thyroid  not enlarged, symmetric, no tenderness/mass/nodules Lymph nodes: Cervical, supraclavicular, and axillary nodes normal. Resp: clear to auscultation bilaterally Back: symmetric, no curvature. ROM normal. No CVA tenderness. Cardio: regular rate and rhythm, S1, S2 normal, no murmur, click, rub or gallop GI: soft, non-tender; bowel sounds normal; no masses,  no organomegaly Extremities: extremities normal, atraumatic, no cyanosis or edema Neurologic: Alert and oriented X 3, normal strength and tone. Normal symmetric reflexes. Normal coordination and gait  ECOG PERFORMANCE STATUS: 1 - Symptomatic but completely ambulatory  Blood pressure 136/84, pulse 88, temperature (!) 97.3 F (36.3 C), resp. rate 17, height 5' 7 (1.702 m), weight 148 lb 4.8 oz (67.3 kg), SpO2 97%.  LABORATORY DATA: Lab Results  Component Value Date   WBC 3.2 (L) 09/06/2024   HGB 9.0 (L) 09/06/2024   HCT 27.8 (L) 09/06/2024   MCV 93.9 09/06/2024   PLT 159 09/06/2024      Chemistry      Component Value Date/Time   NA 138  08/31/2024 0919   NA 142 11/11/2022 1000   K 3.9 08/31/2024 0919   CL 107 08/31/2024 0919   CO2 26 08/31/2024 0919   BUN 15 08/31/2024 0919   BUN 18 11/11/2022 1000   CREATININE 1.10 08/31/2024 0919   CREATININE 1.37 (H) 01/24/2017 0950      Component Value Date/Time   CALCIUM  9.0 08/31/2024 0919   ALKPHOS 45 08/31/2024 0919   AST 12 (L) 08/31/2024 0919   ALT 7 08/31/2024 0919   BILITOT 0.5 08/31/2024 0919       RADIOGRAPHIC STUDIES: CT CHEST ABDOMEN PELVIS WO CONTRAST Result Date: 09/01/2024 CLINICAL DATA:  Metastatic non-small-cell lung cancer restaging, assess treatment response * Tracking Code: BO * EXAM: CT CHEST, ABDOMEN AND PELVIS WITHOUT CONTRAST TECHNIQUE: Multidetector CT imaging of the chest, abdomen and pelvis was performed following the standard protocol without IV contrast. RADIATION DOSE REDUCTION: This exam was performed according to the departmental dose-optimization program which includes automated exposure control, adjustment of the mA and/or kV according to patient size and/or use of iterative reconstruction technique. COMPARISON:  CT chest abdomen pelvis angiogram, 07/12/2024, CT chest, 05/31/2024 FINDINGS: CT CHEST FINDINGS Cardiovascular: Left chest multi lead pacer defibrillator. Normal heart size. Extensive three-vessel coronary artery calcifications no pericardial effusion. Mediastinum/Nodes: No appreciable change in noncontrast appearance of matted, infiltrative soft tissue in the AP window and about the left hilum (series 2, image 22). Enlarged subcarinal lymph node unchanged at 2.4 x 1.5 cm (series 2, image 30). Thyroid  gland, trachea, and esophagus demonstrate no significant findings. Lungs/Pleura: No significant change in a dense, masslike consolidation of the posterior lingula containing air bronchograms, measuring 5.4 x 4.2 cm (series 6, image 83). Unchanged paramedian fibrosis and scarring of both upper lungs (series 6, image 54). Previously seen small left  pleural effusion is resolved Musculoskeletal: No chest wall abnormality. No acute osseous findings. CT ABDOMEN PELVIS FINDINGS Hepatobiliary: No focal liver abnormality is seen. Status post cholecystectomy. No biliary dilatation. Pancreas: Unremarkable. No pancreatic ductal dilatation or surrounding inflammatory changes. Spleen: Normal in size without significant abnormality. Adrenals/Urinary Tract: Adrenal glands are unremarkable. Multiple small bilateral nonobstructive renal calculi. Simple, benign renal cortical cysts for which no further follow-up or characterization is required. Bladder is unremarkable. Stomach/Bowel: Stomach is within normal limits. Appendix appears normal. No evidence of bowel wall thickening, distention, or inflammatory changes. Vascular/Lymphatic: Aortic  atherosclerosis. No enlarged abdominal or pelvic lymph nodes. Reproductive: No mass or other abnormality. Other: No abdominal wall hernia or abnormality. No ascites. Musculoskeletal: No acute osseous findings. IMPRESSION: 1. No significant change in a dense, masslike consolidation of the posterior lingula containing air bronchograms, measuring 5.4 x 4.2 cm. This is consistent with treated primary lung malignancy. 2. No appreciable change in noncontrast appearance of matted, infiltrative soft tissue in the AP window and about the left hilum. 3. Enlarged subcarinal lymph node unchanged. 4. PET-CT may be helpful to assess for residual or recurrent metabolically active disease. 5. No noncontrast evidence of lymphadenopathy or metastatic disease in the abdomen or pelvis. 6. Previously seen small left pleural effusion is resolved. 7. Coronary artery disease. 8. Nonobstructive bilateral nephrolithiasis. Aortic Atherosclerosis (ICD10-I70.0). Electronically Signed   By: Marolyn JONETTA Jaksch M.D.   On: 09/01/2024 07:57    ASSESSMENT AND PLAN: This is a very pleasant 83 years old African-American male with Stage IIIC/IV (T3, N3, M0) non-small cell lung  cancer, adenocarcinoma. He presented with a left upper lobe spiculated mass in addition to lingular lesion and suspicious mediastinal lymphadenopathy. He started a course of concurrent chemoradiation initially with carboplatin  for AUC of 2 and paclitaxel  45 Mg/M2 but paclitaxel  was the changed to Abraxane  starting from cycle #2 secondary to hypersensitivity reaction.  The patient received 5 cycles of concurrent chemoradiation.  He tolerated this treatment fairly well except for fatigue. The patient is currently on consolidation treatment with immunotherapy with Imfinzi  1500 Mg IV every 4 weeks.  Status post 2 cycles.  He has been tolerating this treatment well except for the itching and rash The patient he in the lower abdomen and back.  He decided to stop his treatment at that time.  Last dose was given in February 2025.  He is currently on observation. He had no evidence for disease recurrence in July 2025 and he started palliative systemic chemoimmunotherapy with carboplatin  for AUC of 4, Alimta  400 Mg/M2 and Keytruda  200 Mg IV every 3 weeks.  Status post 3 cycles.  First cycle was given on June 20, 2024.  He has a rough time with pruritus as well as itching which is likely secondary to Keytruda .  He has similar symptoms when he was on Imfinzi  in the past.  Keytruda  was discontinued starting from cycle #2.  He continues to tolerate the treatment much better. He had repeat CT scan of the chest, abdomen and pelvis performed recently.  I personally independently reviewed the scan and discussed the result with the patient today.  His scan showed no concerning findings for disease progression. Assessment and Plan Assessment & Plan Recurrent stage IV non-small cell lung cancer Initially diagnosed in August 2024 with no actionable mutation and PD-L1 expression of 1%. Currently undergoing palliative chemotherapy with carboplatin  and Alimta . Keytruda  was discontinued after cycle 2 due to intolerance (itching) and  patient preference. After three cycles, recent CT scan shows stable disease with no growth or spread and improvement in pleural effusion. Reports no significant side effects from current treatment regimen, except for occasional headaches. Weight stable with good appetite. - Administer cycle 4 of carboplatin  and Alimta  today. - Transition to maintenance therapy with Alimta  only every three weeks starting next cycle. - No blood work required between treatments. - Provide copy of CT scan results to him. - Schedule follow-up in three weeks.  Chemotherapy-induced pruritus (resolved) Pruritus previously experienced with Keytruda  has resolved following its discontinuation. No recurrence of itching or rash reported. He  was advised to call immediately if he has any other concerning symptoms in the interval. The patient voices understanding of current disease status and treatment options and is in agreement with the current care plan.  All questions were answered. The patient knows to call the clinic with any problems, questions or concerns. We can certainly see the patient much sooner if necessary.  The total time spent in the appointment was 35 minutes.  Disclaimer: This note was dictated with voice recognition software. Similar sounding words can inadvertently be transcribed and may not be corrected upon review.

## 2024-09-10 ENCOUNTER — Encounter

## 2024-09-12 ENCOUNTER — Other Ambulatory Visit

## 2024-09-12 ENCOUNTER — Inpatient Hospital Stay

## 2024-09-12 ENCOUNTER — Ambulatory Visit

## 2024-09-12 ENCOUNTER — Ambulatory Visit: Admitting: Physician Assistant

## 2024-09-12 DIAGNOSIS — Z5111 Encounter for antineoplastic chemotherapy: Secondary | ICD-10-CM | POA: Diagnosis not present

## 2024-09-12 DIAGNOSIS — C3412 Malignant neoplasm of upper lobe, left bronchus or lung: Secondary | ICD-10-CM

## 2024-09-12 LAB — CBC WITH DIFFERENTIAL (CANCER CENTER ONLY)
Abs Immature Granulocytes: 0.02 K/uL (ref 0.00–0.07)
Basophils Absolute: 0 K/uL (ref 0.0–0.1)
Basophils Relative: 1 %
Eosinophils Absolute: 0 K/uL (ref 0.0–0.5)
Eosinophils Relative: 1 %
HCT: 28.2 % — ABNORMAL LOW (ref 39.0–52.0)
Hemoglobin: 9.3 g/dL — ABNORMAL LOW (ref 13.0–17.0)
Immature Granulocytes: 2 %
Lymphocytes Relative: 32 %
Lymphs Abs: 0.4 K/uL — ABNORMAL LOW (ref 0.7–4.0)
MCH: 31.2 pg (ref 26.0–34.0)
MCHC: 33 g/dL (ref 30.0–36.0)
MCV: 94.6 fL (ref 80.0–100.0)
Monocytes Absolute: 0.1 K/uL (ref 0.1–1.0)
Monocytes Relative: 5 %
Neutro Abs: 0.8 K/uL — ABNORMAL LOW (ref 1.7–7.7)
Neutrophils Relative %: 59 %
Platelet Count: 101 K/uL — ABNORMAL LOW (ref 150–400)
RBC: 2.98 MIL/uL — ABNORMAL LOW (ref 4.22–5.81)
RDW: 17.4 % — ABNORMAL HIGH (ref 11.5–15.5)
WBC Count: 1.3 K/uL — ABNORMAL LOW (ref 4.0–10.5)
nRBC: 0 % (ref 0.0–0.2)

## 2024-09-12 LAB — CMP (CANCER CENTER ONLY)
ALT: 9 U/L (ref 0–44)
AST: 17 U/L (ref 15–41)
Albumin: 3.7 g/dL (ref 3.5–5.0)
Alkaline Phosphatase: 54 U/L (ref 38–126)
Anion gap: 8 (ref 5–15)
BUN: 27 mg/dL — ABNORMAL HIGH (ref 8–23)
CO2: 23 mmol/L (ref 22–32)
Calcium: 9.4 mg/dL (ref 8.9–10.3)
Chloride: 104 mmol/L (ref 98–111)
Creatinine: 1.23 mg/dL (ref 0.61–1.24)
GFR, Estimated: 58 mL/min — ABNORMAL LOW (ref >60)
Glucose, Bld: 93 mg/dL (ref 70–99)
Potassium: 4 mmol/L (ref 3.5–5.1)
Sodium: 135 mmol/L (ref 135–145)
Total Bilirubin: 0.8 mg/dL (ref 0.0–1.2)
Total Protein: 7.5 g/dL (ref 6.5–8.1)

## 2024-09-17 ENCOUNTER — Ambulatory Visit: Admitting: Physician Assistant

## 2024-09-17 ENCOUNTER — Ambulatory Visit

## 2024-09-17 ENCOUNTER — Other Ambulatory Visit

## 2024-09-19 ENCOUNTER — Telehealth: Payer: Self-pay | Admitting: *Deleted

## 2024-09-19 ENCOUNTER — Inpatient Hospital Stay

## 2024-09-19 ENCOUNTER — Other Ambulatory Visit: Payer: Self-pay | Admitting: Physician Assistant

## 2024-09-19 DIAGNOSIS — Z5111 Encounter for antineoplastic chemotherapy: Secondary | ICD-10-CM | POA: Diagnosis not present

## 2024-09-19 DIAGNOSIS — C3412 Malignant neoplasm of upper lobe, left bronchus or lung: Secondary | ICD-10-CM

## 2024-09-19 LAB — CBC WITH DIFFERENTIAL (CANCER CENTER ONLY)
Abs Immature Granulocytes: 0 K/uL (ref 0.00–0.07)
Basophils Absolute: 0 K/uL (ref 0.0–0.1)
Basophils Relative: 0 %
Eosinophils Absolute: 0 K/uL (ref 0.0–0.5)
Eosinophils Relative: 1 %
HCT: 22.9 % — ABNORMAL LOW (ref 39.0–52.0)
Hemoglobin: 7.6 g/dL — ABNORMAL LOW (ref 13.0–17.0)
Immature Granulocytes: 0 %
Lymphocytes Relative: 40 %
Lymphs Abs: 0.4 K/uL — ABNORMAL LOW (ref 0.7–4.0)
MCH: 31.3 pg (ref 26.0–34.0)
MCHC: 33.2 g/dL (ref 30.0–36.0)
MCV: 94.2 fL (ref 80.0–100.0)
Monocytes Absolute: 0.3 K/uL (ref 0.1–1.0)
Monocytes Relative: 25 %
Neutro Abs: 0.4 K/uL — CL (ref 1.7–7.7)
Neutrophils Relative %: 34 %
Platelet Count: 27 K/uL — ABNORMAL LOW (ref 150–400)
RBC: 2.43 MIL/uL — ABNORMAL LOW (ref 4.22–5.81)
RDW: 15.6 % — ABNORMAL HIGH (ref 11.5–15.5)
WBC Count: 1.1 K/uL — ABNORMAL LOW (ref 4.0–10.5)
nRBC: 0 % (ref 0.0–0.2)

## 2024-09-19 LAB — CMP (CANCER CENTER ONLY)
ALT: 8 U/L (ref 0–44)
AST: 17 U/L (ref 15–41)
Albumin: 3.5 g/dL (ref 3.5–5.0)
Alkaline Phosphatase: 50 U/L (ref 38–126)
Anion gap: 6 (ref 5–15)
BUN: 18 mg/dL (ref 8–23)
CO2: 24 mmol/L (ref 22–32)
Calcium: 8.9 mg/dL (ref 8.9–10.3)
Chloride: 105 mmol/L (ref 98–111)
Creatinine: 1.31 mg/dL — ABNORMAL HIGH (ref 0.61–1.24)
GFR, Estimated: 53 mL/min — ABNORMAL LOW (ref >60)
Glucose, Bld: 95 mg/dL (ref 70–99)
Potassium: 3.8 mmol/L (ref 3.5–5.1)
Sodium: 135 mmol/L (ref 135–145)
Total Bilirubin: 0.5 mg/dL (ref 0.0–1.2)
Total Protein: 7.1 g/dL (ref 6.5–8.1)

## 2024-09-19 NOTE — Telephone Encounter (Signed)
 Pt ANC 0.4. Called and made pt wife aware and advised on neutropenic precautions. Advised that provider recommended growth factor injection for the next 3 days. Scheduled pt for injection appts. Wife aware of injection appts and times. Pt wife verbalized understanding

## 2024-09-20 ENCOUNTER — Inpatient Hospital Stay

## 2024-09-20 VITALS — BP 147/82 | HR 71 | Temp 97.8°F | Resp 14

## 2024-09-20 DIAGNOSIS — T451X5A Adverse effect of antineoplastic and immunosuppressive drugs, initial encounter: Secondary | ICD-10-CM

## 2024-09-20 DIAGNOSIS — Z5111 Encounter for antineoplastic chemotherapy: Secondary | ICD-10-CM | POA: Diagnosis not present

## 2024-09-20 MED ORDER — FILGRASTIM-SNDZ 300 MCG/0.5ML IJ SOSY
300.0000 ug | PREFILLED_SYRINGE | Freq: Once | INTRAMUSCULAR | Status: AC
  Administered 2024-09-20: 300 ug via SUBCUTANEOUS
  Filled 2024-09-20: qty 0.5

## 2024-09-21 ENCOUNTER — Inpatient Hospital Stay

## 2024-09-21 VITALS — BP 122/67 | HR 70 | Temp 98.2°F | Resp 14

## 2024-09-21 DIAGNOSIS — T451X5A Adverse effect of antineoplastic and immunosuppressive drugs, initial encounter: Secondary | ICD-10-CM

## 2024-09-21 DIAGNOSIS — Z5111 Encounter for antineoplastic chemotherapy: Secondary | ICD-10-CM | POA: Diagnosis not present

## 2024-09-21 MED ORDER — FILGRASTIM-SNDZ 300 MCG/0.5ML IJ SOSY
300.0000 ug | PREFILLED_SYRINGE | Freq: Once | INTRAMUSCULAR | Status: AC
  Administered 2024-09-21: 300 ug via SUBCUTANEOUS
  Filled 2024-09-21: qty 0.5

## 2024-09-22 ENCOUNTER — Inpatient Hospital Stay: Attending: Internal Medicine

## 2024-09-22 VITALS — BP 120/60 | HR 70 | Temp 97.9°F | Resp 15

## 2024-09-22 DIAGNOSIS — T451X5A Adverse effect of antineoplastic and immunosuppressive drugs, initial encounter: Secondary | ICD-10-CM

## 2024-09-22 DIAGNOSIS — N281 Cyst of kidney, acquired: Secondary | ICD-10-CM | POA: Diagnosis not present

## 2024-09-22 DIAGNOSIS — I5022 Chronic systolic (congestive) heart failure: Secondary | ICD-10-CM | POA: Insufficient documentation

## 2024-09-22 DIAGNOSIS — J984 Other disorders of lung: Secondary | ICD-10-CM | POA: Insufficient documentation

## 2024-09-22 DIAGNOSIS — J8410 Pulmonary fibrosis, unspecified: Secondary | ICD-10-CM | POA: Diagnosis not present

## 2024-09-22 DIAGNOSIS — T7840XA Allergy, unspecified, initial encounter: Secondary | ICD-10-CM | POA: Insufficient documentation

## 2024-09-22 DIAGNOSIS — R590 Localized enlarged lymph nodes: Secondary | ICD-10-CM | POA: Insufficient documentation

## 2024-09-22 DIAGNOSIS — I2510 Atherosclerotic heart disease of native coronary artery without angina pectoris: Secondary | ICD-10-CM | POA: Diagnosis not present

## 2024-09-22 DIAGNOSIS — Z9842 Cataract extraction status, left eye: Secondary | ICD-10-CM | POA: Insufficient documentation

## 2024-09-22 DIAGNOSIS — Z9221 Personal history of antineoplastic chemotherapy: Secondary | ICD-10-CM | POA: Insufficient documentation

## 2024-09-22 DIAGNOSIS — N183 Chronic kidney disease, stage 3 unspecified: Secondary | ICD-10-CM | POA: Diagnosis not present

## 2024-09-22 DIAGNOSIS — C3412 Malignant neoplasm of upper lobe, left bronchus or lung: Secondary | ICD-10-CM | POA: Diagnosis present

## 2024-09-22 DIAGNOSIS — I252 Old myocardial infarction: Secondary | ICD-10-CM | POA: Insufficient documentation

## 2024-09-22 DIAGNOSIS — D696 Thrombocytopenia, unspecified: Secondary | ICD-10-CM | POA: Insufficient documentation

## 2024-09-22 DIAGNOSIS — I4891 Unspecified atrial fibrillation: Secondary | ICD-10-CM | POA: Insufficient documentation

## 2024-09-22 DIAGNOSIS — H548 Legal blindness, as defined in USA: Secondary | ICD-10-CM | POA: Diagnosis not present

## 2024-09-22 DIAGNOSIS — R3911 Hesitancy of micturition: Secondary | ICD-10-CM | POA: Insufficient documentation

## 2024-09-22 DIAGNOSIS — L299 Pruritus, unspecified: Secondary | ICD-10-CM | POA: Diagnosis not present

## 2024-09-22 DIAGNOSIS — I739 Peripheral vascular disease, unspecified: Secondary | ICD-10-CM | POA: Diagnosis not present

## 2024-09-22 DIAGNOSIS — Z79899 Other long term (current) drug therapy: Secondary | ICD-10-CM | POA: Insufficient documentation

## 2024-09-22 DIAGNOSIS — N200 Calculus of kidney: Secondary | ICD-10-CM | POA: Diagnosis not present

## 2024-09-22 DIAGNOSIS — I130 Hypertensive heart and chronic kidney disease with heart failure and stage 1 through stage 4 chronic kidney disease, or unspecified chronic kidney disease: Secondary | ICD-10-CM | POA: Diagnosis not present

## 2024-09-22 DIAGNOSIS — Z5111 Encounter for antineoplastic chemotherapy: Secondary | ICD-10-CM | POA: Insufficient documentation

## 2024-09-22 DIAGNOSIS — I700 Atherosclerosis of aorta: Secondary | ICD-10-CM | POA: Diagnosis not present

## 2024-09-22 DIAGNOSIS — R067 Sneezing: Secondary | ICD-10-CM | POA: Insufficient documentation

## 2024-09-22 DIAGNOSIS — R0602 Shortness of breath: Secondary | ICD-10-CM | POA: Diagnosis not present

## 2024-09-22 DIAGNOSIS — Z860102 Personal history of hyperplastic colon polyps: Secondary | ICD-10-CM | POA: Insufficient documentation

## 2024-09-22 DIAGNOSIS — Z9049 Acquired absence of other specified parts of digestive tract: Secondary | ICD-10-CM | POA: Insufficient documentation

## 2024-09-22 DIAGNOSIS — R519 Headache, unspecified: Secondary | ICD-10-CM | POA: Diagnosis not present

## 2024-09-22 DIAGNOSIS — Z7901 Long term (current) use of anticoagulants: Secondary | ICD-10-CM | POA: Insufficient documentation

## 2024-09-22 DIAGNOSIS — Z8701 Personal history of pneumonia (recurrent): Secondary | ICD-10-CM | POA: Insufficient documentation

## 2024-09-22 DIAGNOSIS — Z9841 Cataract extraction status, right eye: Secondary | ICD-10-CM | POA: Insufficient documentation

## 2024-09-22 DIAGNOSIS — D649 Anemia, unspecified: Secondary | ICD-10-CM | POA: Diagnosis not present

## 2024-09-22 DIAGNOSIS — Z923 Personal history of irradiation: Secondary | ICD-10-CM | POA: Insufficient documentation

## 2024-09-22 DIAGNOSIS — Z87442 Personal history of urinary calculi: Secondary | ICD-10-CM | POA: Insufficient documentation

## 2024-09-22 DIAGNOSIS — Z9226 Personal history of immune checkpoint inhibitor therapy: Secondary | ICD-10-CM | POA: Insufficient documentation

## 2024-09-22 MED ORDER — FILGRASTIM-SNDZ 300 MCG/0.5ML IJ SOSY
300.0000 ug | PREFILLED_SYRINGE | Freq: Once | INTRAMUSCULAR | Status: AC
  Administered 2024-09-22: 300 ug via SUBCUTANEOUS
  Filled 2024-09-22: qty 0.5

## 2024-09-22 NOTE — Patient Instructions (Signed)
 Filgrastim Injection What is this medication? FILGRASTIM (fil GRA stim) lowers the risk of infection in people who are receiving chemotherapy. It works by Systems analyst make more white blood cells, which protects your body from infection. It may also be used to help people who have been exposed to high doses of radiation. It can be used to help prepare your body before a stem cell transplant. It works by helping your bone marrow make and release stem cells into the blood. This medicine may be used for other purposes; ask your health care provider or pharmacist if you have questions. COMMON BRAND NAME(S): Neupogen, Nivestym, Nypozi, Releuko, Zarxio What should I tell my care team before I take this medication? They need to know if you have any of these conditions: History of blood diseases, such as sickle cell anemia Kidney disease Recent or ongoing radiation An unusual or allergic reaction to filgrastim, pegfilgrastim, latex, rubber, other medications, foods, dyes, or preservatives Pregnant or trying to get pregnant Breast-feeding How should I use this medication? This medication is injected under the skin or into a vein. It is usually given by your care team in a hospital or clinic setting. It may be given at home. If you get this medication at home, you will be taught how to prepare and give it. Use exactly as directed. Take it as directed on the prescription label at the same time every day. Keep taking it unless your care team tells you to stop. It is important that you put your used needles and syringes in a special sharps container. Do not put them in a trash can. If you do not have a sharps container, call your pharmacist or care team to get one. This medication comes with INSTRUCTIONS FOR USE. Ask your pharmacist for directions on how to use this medication. Read the information carefully. Talk to your pharmacist or care team if you have questions. Talk to your care team about the use of  this medication in children. While it may be prescribed for children for selected conditions, precautions do apply. Overdosage: If you think you have taken too much of this medicine contact a poison control center or emergency room at once. NOTE: This medicine is only for you. Do not share this medicine with others. What if I miss a dose? It is important not to miss any doses. Talk to your care team about what to do if you miss a dose. What may interact with this medication? Medications that may cause a release of neutrophils, such as lithium This list may not describe all possible interactions. Give your health care provider a list of all the medicines, herbs, non-prescription drugs, or dietary supplements you use. Also tell them if you smoke, drink alcohol, or use illegal drugs. Some items may interact with your medicine. What should I watch for while using this medication? Your condition will be monitored carefully while you are receiving this medication. You may need bloodwork while taking this medication. Talk to your care team about your risk of cancer. You may be more at risk for certain types of cancer if you take this medication. What side effects may I notice from receiving this medication? Side effects that you should report to your care team as soon as possible: Allergic reactions--skin rash, itching, hives, swelling of the face, lips, tongue, or throat Capillary leak syndrome--stomach or muscle pain, unusual weakness or fatigue, feeling faint or lightheaded, decrease in the amount of urine, swelling of the ankles, hands,  or feet, trouble breathing High white blood cell level--fever, fatigue, trouble breathing, night sweats, change in vision, weight loss Inflammation of the aorta--fever, fatigue, back, chest, or stomach pain, severe headache Kidney injury (glomerulonephritis)--decrease in the amount of urine, red or dark brown urine, foamy or bubbly urine, swelling of the ankles, hands,  or feet Shortness of breath or trouble breathing Spleen injury--pain in upper left stomach or shoulder Unusual bruising or bleeding Side effects that usually do not require medical attention (report to your care team if they continue or are bothersome): Back pain Bone pain Fatigue Fever Headache Nausea This list may not describe all possible side effects. Call your doctor for medical advice about side effects. You may report side effects to FDA at 1-800-FDA-1088. Where should I keep my medication? Keep out of the reach of children and pets. Keep this medication in the original packaging until you are ready to take it. Protect from light. See product for storage information. Each product may have different instructions. Get rid of any unused medication after the expiration date. To get rid of medications that are no longer needed or have expired: Take the medication to a medications take-back program. Check with your pharmacy or law enforcement to find a location. If you cannot return the medication, ask your pharmacist or care team how to get rid of this medication safely. NOTE: This sheet is a summary. It may not cover all possible information. If you have questions about this medicine, talk to your doctor, pharmacist, or health care provider.  2024 Elsevier/Gold Standard (2022-04-01 00:00:00)

## 2024-09-24 NOTE — Progress Notes (Unsigned)
 [facility] OFFICE PROGRESS NOTE  Rodney Aland, MD [address] [place] [address]  DIAGNOSIS: Stage IIIC/IV (T3, N3, M0) non-small cell lung cancer, adenocarcinoma. He presented with a left upper lobe spiculated mass in addition to lingular lesion and suspicious mediastinal lymphadenopathy He also has bilateral hypermetabolic axillary lymph nodes which could be related to metastatic disease versus inflammatory as the patient did have vaccines in both of his arms the week prior to his PET scan.  This was diagnosed in August 2024.   Molecular Studies: His PD-L1 expression was 1% and he has no actionable mutations.  PRIOR THERAPY: 1) Concurrent chemoradiation with carboplatin  for an AUC of 2 and paclitaxel  45 mg/m.  First dose expected on 08/15/23.  Status post 5 cycles of first cycle was given with carboplatin  and paclitaxel  and starting from cycle #2 he was on carboplatin  and Abraxane  secondary to hypersensitivity reaction to paclitaxel . 2) Consolidation immunotherapy with Imfinzi  1500 Mg IV every 4 weeks.  First dose November 28, 2023.  Status post 2 cycles.  Discontinued secondary to intolerance and patient is requested  CURRENT THERAPY:  First-line systemic chemotherapy with carboplatin  for AUC of 4, Alimta  400 Mg/M2 and Keytruda  200 Mg IV every 3 weeks. First dose June 20, 2024. Starting cycle #2 Keytruda  will be discontinued secondary to intolerance with significant itching and patient's preference. He is status post 4 cycles.     INTERVAL HISTORY: [name] 83 y.o. male returns to the clinic today for a follow-up visit. The patient was last seen in the clinic by myself and Dr. Sherrod on 09/06/24.   In summary the patient is currently undergoing chemotherapy.  He is status post 4 cycles.  He is on a reduced dose of chemo.   He did require G-CSF injections in the interval. He denies any signs and symptoms of infection. No recent signs of infection, such as upper  respiratory or skin infections, burning with urination, or changes in his cough. He experiences sneezing but denies having allergies and continues to take Zyrtec. He does describe urinary hesitancy/initiating a stream. He denies dysuria, malodorous urine, or hematuria. He denies back pain. He has non-obstructing renal calculi. He sees Dr. Carolee from urology.   He reports fluctuations in appetite but notes his weight has remained stable, with a slight gain of one pound. He experiences shortness of breath during activities such as showering and walking from the bedroom to the living room, which improves with rest. No coughing up blood, chest pain, nausea, vomiting, diarrhea, or constipation. He occasionally experiences headaches located in the frontal region, which resolve spontaneously within about fifteen minutes without the need for medication.  He is anemic, which contributes to feeling cold, and has previously received blood transfusions, which he reports made him feel better. His energy levels are currently good, but he is concerned about potential drops in energy following treatment. His wife notes that he doesn't feel well with treatment and is wondering how long he needs to be on treatment for.   He is reportedly taking his folic acid .    He is here today for evaluation and repeat blood work before undergoing cycle #5  MEDICAL HISTORY: Past Medical History:  Diagnosis Date   AICD (automatic cardioverter/defibrillator) present    CAD (coronary artery disease) 80% stenosis diag of the LAD, 30% in OM2 branch of LCX in 2009    a. Nonobstructive CAD by cath 11/2011 with the exception of the pre-existing diagonal branch #  2 lesion.   Chronic systolic CHF (congestive heart failure) (HCC)    CKD (chronic kidney disease) stage 3, GFR 30-59 ml/min (HCC)    Colon polyp, hyperplastic    History of kidney stones    History of radiation therapy    Left Lung- 08/17/23-09/29/23- Dr. Lynwood Nasuti   History  of stress test 06/01/2012   Normal myocardial perfusion study. compared to the previous study there is no significant change. this is a low risk scan   Hypertension    Legally blind    both eyes   Myocardial infarction [facility]) 11/22/2011   NICM (nonischemic cardiomyopathy) (HCC)    a. Remote hx of dilated NICM with EF ranging 20-45%, including normal EF by echo (55-60%) in 2014.   Peripheral arterial disease    a. 06/2014: ABI right 0.99, left 1.2, LE dopplers revealing an occluded right posterior tibial. As symptoms were not felt r/t claudication, no further w/u at the time.   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 Requiring discontinuation of BB dose.   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  MEDICATIONS:  Current Outpatient Medications  Medication Sig Dispense Refill   apixaban  (ELIQUIS ) 2.5 MG TABS tablet Take 1 tablet (2.5 mg total) by mouth 2 (two) times daily. 180 tablet 1   BREO ELLIPTA  100-25 MCG/INH AEPB Inhale 1 puff into the lungs every morning.     brimonidine  (ALPHAGAN ) 0.2 % ophthalmic solution Place 1 drop into both eyes 2 (two) times daily.     Continuous Glucose Sensor (FREESTYLE LIBRE 3 PLUS SENSOR) MISC SMARTSIG:Topical Every 3 Months     cycloSPORINE  (RESTASIS ) 0.05 % ophthalmic emulsion Place 1 drop into both eyes 2 (two) times daily.     dorzolamide -timolol  (COSOPT ) 22.3-6.8 MG/ML ophthalmic solution Place 1 drop into both eyes 2 (two) times daily.     doxycycline  (VIBRAMYCIN ) 100 MG capsule Take 1 capsule (100 mg total) by mouth 2 (two) times daily. 20 capsule 0   ENTRESTO  24-26 MG TAKE 1 TABLET BY MOUTH TWICE DAILY 180 tablet 3   folic acid  (FOLVITE ) 1 MG tablet Take 1 tablet (1 mg total) by mouth daily. Start 7 days before pemetrexed   chemotherapy. Continue until 21 days after pemetrexed  completed. 100 tablet 3   furosemide  (LASIX ) 20 MG tablet Take 0.5 tablets (10 mg total) by mouth daily. 30 tablet 1   Glucagon , rDNA, (GLUCAGON  EMERGENCY) 1 MG KIT Inject 1 mg into the skin as needed for up to 2 doses (Severe low blood sugar). 1 kit 0   hydrocortisone  1 % lotion Apply 1 Application topically 2 (two) times daily. 113 g 0   hydrOXYzine  (ATARAX ) 25 MG tablet Take 1 tablet (25 mg total) by mouth every 6 (six) hours as needed for itching. 12 tablet 0   insulin  isophane & regular human KwikPen (HUMULIN 70/30 MIX) (70-30) 100 UNIT/ML KwikPen Inject 25 Units into the skin in the morning and at bedtime. (Patient taking differently: Inject 5 Units into the skin daily at 6 (six) AM.) 15 mL 0   lidocaine  (LIDODERM ) 5 % Place 1 patch onto the skin daily. Remove & Discard patch within 12 hours or as directed by MD 30 patch 0   metoprolol  succinate (TOPROL -XL) 25 MG 24 hr tablet Take 1 tablet (25  mg total) by mouth daily. 90 tablet 3   omeprazole  (PRILOSEC) 20 MG capsule Take 1 capsule (20 mg total) by mouth daily. 30 capsule 2   ondansetron  (ZOFRAN ) 8 MG tablet Take 1 tablet (8 mg total) by mouth every 8 (eight) hours as needed for nausea or vomiting. Start on the third day after carboplatin . (Patient not taking: Reported on 08/02/2024) 30 tablet 1   ROCKLATAN  0.02-0.005 % SOLN Place 1 drop into both eyes at bedtime.     spironolactone  (ALDACTONE ) 25 MG tablet Take 12.5 mg by mouth daily.     triamcinolone  (KENALOG ) 0.025 % ointment Apply 1 Application topically 2 (two) times daily. 15 g 0   VYZULTA 0.024 % SOLN Apply 1 drop to eye at bedtime.     No current facility-administered medications for this visit.   Facility-Administered Medications Ordered in Other Visits  Medication Dose Route Frequency Provider Last Rate Last Admin   0.9 %  sodium chloride  infusion   Intravenous Continuous Sherrod Sherrod, MD 10 mL/hr at 09/27/24 1247 New Bag  at 09/27/24 1247   PEMEtrexed  Disodium (ALIMTA ) 700 mg in sodium chloride  0.9 % 100 mL chemo infusion  400 mg/m2 (Treatment Plan Recorded) Intravenous Once Sherrod Sherrod, MD        SURGICAL HISTORY:  Past Surgical History:  Procedure Laterality Date   BACK SURGERY     BIV UPGRADE N/A 11/08/2019   Procedure: BIV UPGRADE;  Surgeon: Waddell Danelle ORN, MD;  Location: [facility] INVASIVE CV LAB;  Service: Cardiovascular;  Laterality: N/A;   BRONCHIAL BIOPSY  07/05/2023   Procedure: BRONCHIAL BIOPSIES;  Surgeon: Shelah Lamar RAMAN, MD;  Location: [facility] ENDOSCOPY;  Service: Pulmonary;;   BRONCHIAL BRUSHINGS  07/05/2023   Procedure: BRONCHIAL BRUSHINGS;  Surgeon: Shelah Lamar RAMAN, MD;  Location: [facility] ENDOSCOPY;  Service: Pulmonary;;   BRONCHIAL NEEDLE ASPIRATION BIOPSY  07/05/2023   Procedure: BRONCHIAL NEEDLE ASPIRATION BIOPSIES;  Surgeon: Shelah Lamar RAMAN, MD;  Location: [facility] ENDOSCOPY;  Service: Pulmonary;;   BRONCHIAL WASHINGS  07/05/2023   Procedure: BRONCHIAL WASHINGS;  Surgeon: Shelah Lamar RAMAN, MD;  Location: [facility] ENDOSCOPY;  Service: Pulmonary;;   CARDIAC CATHETERIZATION  11/2011   CARDIAC CATHETERIZATION  11/2011   didn't demonstrate high grade obstructive disease to account for his LV dysfunction.   CATARACT EXTRACTION, BILATERAL  1990's   CYSTOSCOPY     CYSTOSCOPY WITH BIOPSY Bilateral 08/03/2021   Procedure: CYSTOSCOPY WITH BLADDER BIOPSY, BILATERAL RETROGRADE PYELOGRAM;  Surgeon: Carolee Sherwood JONETTA DOUGLAS, MD;  Location: [facility] ORS;  Service: Urology;  Laterality: Bilateral;  REQUESTING 45 MINS   CYSTOSCOPY WITH URETHRAL DILATATION N/A 05/04/2013   Procedure: CYSTOSCOPY WITH URETHRAL DILATATION;  Surgeon: Alm RAMAN Molt, MD;  Location: [facility] NEURO ORS;  Service: Neurosurgery;  Laterality: N/A;  with insertion of foley catheter   EP study and ablation of VT  7/13   PVC focus mapped to the right coronary cusp of the aorta, limited ablation performed due to proximity of the focus to the right coronary artery   EYE SURGERY      FINE NEEDLE ASPIRATION  07/05/2023   Procedure: FINE NEEDLE ASPIRATION (FNA) LINEAR;  Surgeon: Shelah Lamar RAMAN, MD;  Location: [facility] ENDOSCOPY;  Service: Pulmonary;;   LEFT HEART CATH AND CORONARY ANGIOGRAPHY N/A 11/07/2019   Procedure: LEFT HEART CATH AND CORONARY ANGIOGRAPHY;  Surgeon: Darron Deatrice LABOR, MD;  Location: [facility] INVASIVE CV LAB;  Service: Cardiovascular;  Laterality: N/A;   LEFT HEART CATHETERIZATION WITH CORONARY ANGIOGRAM N/A 11/25/2011   Procedure:  LEFT HEART CATHETERIZATION WITH CORONARY ANGIOGRAM;  Surgeon: Alm LELON Clay, MD;  Location: [facility] CATH LAB;  Service: Cardiovascular;  Laterality: N/A;   PACEMAKER IMPLANT N/A 07/12/2018   Procedure: PACEMAKER IMPLANT;  Surgeon: Francyne Headland, MD;  Location: [facility] INVASIVE CV LAB;  Service: Cardiovascular;  Laterality: N/A;   POSTERIOR FUSION LUMBAR SPINE  1979   ROTATOR CUFF REPAIR  2000's   left   V-TACH ABLATION N/A 06/06/2012   Procedure: V-TACH ABLATION;  Surgeon: Lynwood Rakers, MD;  Location: [facility] CATH LAB;  Service: Cardiovascular;  Laterality: N/A;   VIDEO BRONCHOSCOPY WITH ENDOBRONCHIAL ULTRASOUND N/A 07/05/2023   Procedure: VIDEO BRONCHOSCOPY WITH ENDOBRONCHIAL ULTRASOUND;  Surgeon: Shelah Lamar RAMAN, MD;  Location: [facility] ENDOSCOPY;  Service: Pulmonary;  Laterality: N/A;    REVIEW OF SYSTEMS:   Constitutional: Positive for stable fatigue. Negative for appetite change, chills, fever and unexpected weight change.  HENT: Negative for mouth sores, nosebleeds, sore throat and trouble swallowing.   Eyes: Negative for eye problems and icterus.  Respiratory: Positive for intermittent shortness of breath with exertion. Negative for cough, hemoptysis, and wheezing.   Cardiovascular: Negative for chest pain and leg swelling.  Gastrointestinal: Negative for abdominal pain, constipation, diarrhea, nausea and vomiting.  Genitourinary: Negative for bladder incontinence, difficulty urinating, dysuria, frequency and hematuria.   Musculoskeletal: Negative for  back pain, gait problem, neck pain and neck stiffness.  Skin: Negative for itching and rash.  Neurological: Negative for dizziness, extremity weakness, gait problem, headaches (none at this time), light-headedness and seizures.  Hematological: Negative for adenopathy. Does not bruise/bleed easily.  Psychiatric/Behavioral: Negative for confusion, depression and sleep disturbance. The patient is not nervous/anxious.       PHYSICAL EXAMINATION:  Blood pressure (!) 149/82, pulse 72, temperature 97.7 F (36.5 C), temperature source Temporal, resp. rate 17, height 5' 7 (1.702 m), weight 149 lb 3.2 oz (67.7 kg), SpO2 98%.  ECOG PERFORMANCE STATUS: 1  Physical Exam  Constitutional: Oriented to person, place, and time and well-developed, well-nourished, and in no distress.  HENT:  Head: Normocephalic and atraumatic.  Mouth/Throat: Oropharynx is clear and moist. No oropharyngeal exudate.  Eyes: Conjunctivae are normal. Right eye exhibits no discharge. Left eye exhibits no discharge. No scleral icterus.  Neck: Normal range of motion. Neck supple.  Cardiovascular: Normal rate, regular rhythm, normal heart sounds and intact distal pulses.   Pulmonary/Chest: Effort normal and breath sounds normal. No respiratory distress. No wheezes. No rales.  Abdominal: Soft. Bowel sounds are normal. Exhibits no distension and no mass. There is no tenderness.  Musculoskeletal: Normal range of motion. Exhibits no edema.  Lymphadenopathy:    No cervical adenopathy.  Neurological: Alert and oriented to person, place, and time. Exhibits normal muscle tone. Gait normal. Coordination normal. Uses a cane for ambulation.  Skin: Skin is warm and dry. No rash noted. Not diaphoretic. No erythema. No pallor.  Psychiatric: Mood, memory and judgment normal.  Vitals reviewed.  LABORATORY DATA: Lab Results  Component Value Date   WBC 6.0 09/27/2024   HGB 7.8 (L) 09/27/2024   HCT 24.4 (L) 09/27/2024   MCV 98.8 09/27/2024    PLT 137 (L) 09/27/2024      Chemistry      Component Value Date/Time   NA 138 09/27/2024 1048   NA 142 11/11/2022 1000   K 4.4 09/27/2024 1048   CL 107 09/27/2024 1048   CO2 26 09/27/2024 1048   BUN 12 09/27/2024 1048   BUN 18 11/11/2022 1000   CREATININE 1.13  09/27/2024 1048   CREATININE 1.37 (H) 01/24/2017 0950      Component Value Date/Time   CALCIUM  9.0 09/27/2024 1048   ALKPHOS 53 09/27/2024 1048   AST 17 09/27/2024 1048   ALT 8 09/27/2024 1048   BILITOT 0.5 09/27/2024 1048       RADIOGRAPHIC STUDIES:  CT CHEST ABDOMEN PELVIS WO CONTRAST Result Date: 09/01/2024 CLINICAL DATA:  Metastatic non-small-cell lung cancer restaging, assess treatment response * Tracking Code: BO * EXAM: CT CHEST, ABDOMEN AND PELVIS WITHOUT CONTRAST TECHNIQUE: Multidetector CT imaging of the chest, abdomen and pelvis was performed following the standard protocol without IV contrast. RADIATION DOSE REDUCTION: This exam was performed according to the departmental dose-optimization program which includes automated exposure control, adjustment of the mA and/or kV according to patient size and/or use of iterative reconstruction technique. COMPARISON:  CT chest abdomen pelvis angiogram, 07/12/2024, CT chest, 05/31/2024 FINDINGS: CT CHEST FINDINGS Cardiovascular: Left chest multi lead pacer defibrillator. Normal heart size. Extensive three-vessel coronary artery calcifications no pericardial effusion. Mediastinum/Nodes: No appreciable change in noncontrast appearance of matted, infiltrative soft tissue in the AP window and about the left hilum (series 2, image 22). Enlarged subcarinal lymph node unchanged at 2.4 x 1.5 cm (series 2, image 30). Thyroid  gland, trachea, and esophagus demonstrate no significant findings. Lungs/Pleura: No significant change in a dense, masslike consolidation of the posterior lingula containing air bronchograms, measuring 5.4 x 4.2 cm (series 6, image 83). Unchanged paramedian fibrosis  and scarring of both upper lungs (series 6, image 54). Previously seen small left pleural effusion is resolved Musculoskeletal: No chest wall abnormality. No acute osseous findings. CT ABDOMEN PELVIS FINDINGS Hepatobiliary: No focal liver abnormality is seen. Status post cholecystectomy. No biliary dilatation. Pancreas: Unremarkable. No pancreatic ductal dilatation or surrounding inflammatory changes. Spleen: Normal in size without significant abnormality. Adrenals/Urinary Tract: Adrenal glands are unremarkable. Multiple small bilateral nonobstructive renal calculi. Simple, benign renal cortical cysts for which no further follow-up or characterization is required. Bladder is unremarkable. Stomach/Bowel: Stomach is within normal limits. Appendix appears normal. No evidence of bowel wall thickening, distention, or inflammatory changes. Vascular/Lymphatic: Aortic atherosclerosis. No enlarged abdominal or pelvic lymph nodes. Reproductive: No mass or other abnormality. Other: No abdominal wall hernia or abnormality. No ascites. Musculoskeletal: No acute osseous findings. IMPRESSION: 1. No significant change in a dense, masslike consolidation of the posterior lingula containing air bronchograms, measuring 5.4 x 4.2 cm. This is consistent with treated primary lung malignancy. 2. No appreciable change in noncontrast appearance of matted, infiltrative soft tissue in the AP window and about the left hilum. 3. Enlarged subcarinal lymph node unchanged. 4. PET-CT may be helpful to assess for residual or recurrent metabolically active disease. 5. No noncontrast evidence of lymphadenopathy or metastatic disease in the abdomen or pelvis. 6. Previously seen small left pleural effusion is resolved. 7. Coronary artery disease. 8. Nonobstructive bilateral nephrolithiasis. Aortic Atherosclerosis (ICD10-I70.0). Electronically Signed   By: Marolyn JONETTA Jaksch M.D.   On: 09/01/2024 07:57     ASSESSMENT/PLAN:  This is a very pleasant  83 year old African-American male with suspicious stage IIIC/IV (T3, N3, M0) non-small cell lung cancer, adenocarcinoma. He presented with a left upper lobe lung mass and lesion in the lingula.  He also has bilateral mediastinal lymph nodes and hypermetabolic bilateral axillary lymph nodes.  Although it is unclear if the axillary lymph nodes are related to metastatic disease or inflammatory as he did have bilateral vaccines in his upper arms just prior to his PET scan and his  axillary biopsy was negative. He was He was diagnosed in August 2024.    His molecular studies show no actionable mutations and his PD-L1 expression is 1%   He started a course of concurrent chemoradiation initially with carboplatin  for AUC of 2 and paclitaxel  45 Mg/M2 but paclitaxel  was the changed to Abraxane  starting from cycle #2 secondary to hypersensitivity reaction.  The patient received 5 cycles of concurrent chemoradiation.  He tolerated this treatment fairly well except for fatigue.   The patient then underwent consolidation immunotherapy with Imfinzi  1500 mg IV every 4 weeks.  He status post 2 cycles.  He developed significant itching and rash he decided to stop treatment at that time and the last dose was given in February 2025.   In July 2025 he started having evidence of disease recurrence in July 2025.  Therefore Dr. Sherrod started him on palliative systemic chemotherapy with carboplatin  for an AUC of 4 and Alimta  400 mg/m.  He is status post 4 cycles.  The patient refused Keytruda  due to prior intolerance with immunotherapy.  Starting from today, cycle #5 he is expected to start maintenance single agent Alimta  400 mg/m.  The patient had some cytopenias last week which have recovered.  Of note the patient had thrombocytopenia.  She takes Eliquis  due to A-fib.   Hopefully with the patient only being on single agent dose reduced Alimta  he will not have as many side effects and cytopenias from treatment and will not  have as many side effects. SABRA  His hemoglobin is 7.8.  We will arrange for 1 unit of blood on Saturday, 09/28/2024.   Labs were reviewed.  Recommend that he proceed cycle #5 today scheduled.    We will see him in 3 weeks before undergoing cycle #6.  I encourage him to talk to his urologist as it sounds like he has s/sx of BPH.      The patient was advised to call immediately if she has any concerning symptoms in the interval. The patient voices understanding of current disease status and treatment options and is in agreement with the current care plan. All questions were answered. The patient knows to call the clinic with any problems, questions or concerns. We can certainly see the patient much sooner if necessary   Orders Placed This Encounter  Procedures   Informed Consent Details: Physician/Practitioner Attestation; Transcribe to consent form and obtain patient signature    Standing Status:   Future    Expiration Date:   09/27/2025    Physician/Practitioner attestation of informed consent for blood and or blood product transfusion:   I, the physician/practitioner, attest that I have discussed with the patient the benefits, risks, side effects, alternatives, likelihood of achieving goals and potential problems during recovery for the procedure that I have provided informed consent.    Product(s):   All Product(s)   Care order/instruction    Transfuse Parameters    Standing Status:   Future    Expiration Date:   09/27/2025   Type and screen         Standing Status:   Future    Number of Occurrences:   1    Expected Date:   09/27/2024    Expiration Date:   09/27/2025   Prepare RBC (crossmatch)    Standing Status:   Standing    Number of Occurrences:   1    # of Units:   1 unit    Transfusion Indications:   Hemoglobin 8 gm/dL  or less and orthopedic or cardiac surgery or pre-existing cardiac condition    Number of Units to Keep Ahead:   NO units ahead    If emergent release call  blood bank:   Not emergent release   Sample to Blood Bank    Standing Status:   Standing    Number of Occurrences:   5    Next Expected Occurrence:   10/12/2024    Expiration Date:   09/27/2025     The total time spent in the appointment was 30-39 minutes  Jalyric Kaestner L Darrnell Mangiaracina, PA-C 09/27/24

## 2024-09-26 ENCOUNTER — Other Ambulatory Visit: Payer: Self-pay | Admitting: Physician Assistant

## 2024-09-26 DIAGNOSIS — D6481 Anemia due to antineoplastic chemotherapy: Secondary | ICD-10-CM

## 2024-09-27 ENCOUNTER — Inpatient Hospital Stay

## 2024-09-27 ENCOUNTER — Inpatient Hospital Stay (HOSPITAL_BASED_OUTPATIENT_CLINIC_OR_DEPARTMENT_OTHER): Admitting: Physician Assistant

## 2024-09-27 VITALS — BP 149/82 | HR 72 | Temp 97.7°F | Resp 17 | Ht 67.0 in | Wt 149.2 lb

## 2024-09-27 DIAGNOSIS — T451X5A Adverse effect of antineoplastic and immunosuppressive drugs, initial encounter: Secondary | ICD-10-CM | POA: Diagnosis not present

## 2024-09-27 DIAGNOSIS — D6481 Anemia due to antineoplastic chemotherapy: Secondary | ICD-10-CM

## 2024-09-27 DIAGNOSIS — C3412 Malignant neoplasm of upper lobe, left bronchus or lung: Secondary | ICD-10-CM | POA: Diagnosis not present

## 2024-09-27 DIAGNOSIS — Z5111 Encounter for antineoplastic chemotherapy: Secondary | ICD-10-CM | POA: Diagnosis not present

## 2024-09-27 LAB — CBC WITH DIFFERENTIAL (CANCER CENTER ONLY)
Abs Immature Granulocytes: 0.38 K/uL — ABNORMAL HIGH (ref 0.00–0.07)
Basophils Absolute: 0 K/uL (ref 0.0–0.1)
Basophils Relative: 1 %
Eosinophils Absolute: 0 K/uL (ref 0.0–0.5)
Eosinophils Relative: 0 %
HCT: 24.4 % — ABNORMAL LOW (ref 39.0–52.0)
Hemoglobin: 7.8 g/dL — ABNORMAL LOW (ref 13.0–17.0)
Immature Granulocytes: 6 %
Lymphocytes Relative: 10 %
Lymphs Abs: 0.6 K/uL — ABNORMAL LOW (ref 0.7–4.0)
MCH: 31.6 pg (ref 26.0–34.0)
MCHC: 32 g/dL (ref 30.0–36.0)
MCV: 98.8 fL (ref 80.0–100.0)
Monocytes Absolute: 1.3 K/uL — ABNORMAL HIGH (ref 0.1–1.0)
Monocytes Relative: 22 %
Neutro Abs: 3.6 K/uL (ref 1.7–7.7)
Neutrophils Relative %: 61 %
Platelet Count: 137 K/uL — ABNORMAL LOW (ref 150–400)
RBC: 2.47 MIL/uL — ABNORMAL LOW (ref 4.22–5.81)
RDW: 18.6 % — ABNORMAL HIGH (ref 11.5–15.5)
WBC Count: 6 K/uL (ref 4.0–10.5)
nRBC: 0 % (ref 0.0–0.2)

## 2024-09-27 LAB — CMP (CANCER CENTER ONLY)
ALT: 8 U/L (ref 0–44)
AST: 17 U/L (ref 15–41)
Albumin: 3.4 g/dL — ABNORMAL LOW (ref 3.5–5.0)
Alkaline Phosphatase: 53 U/L (ref 38–126)
Anion gap: 5 (ref 5–15)
BUN: 12 mg/dL (ref 8–23)
CO2: 26 mmol/L (ref 22–32)
Calcium: 9 mg/dL (ref 8.9–10.3)
Chloride: 107 mmol/L (ref 98–111)
Creatinine: 1.13 mg/dL (ref 0.61–1.24)
GFR, Estimated: >60 mL/min (ref >60)
Glucose, Bld: 99 mg/dL (ref 70–99)
Potassium: 4.4 mmol/L (ref 3.5–5.1)
Sodium: 138 mmol/L (ref 135–145)
Total Bilirubin: 0.5 mg/dL (ref 0.0–1.2)
Total Protein: 7 g/dL (ref 6.5–8.1)

## 2024-09-27 LAB — PREPARE RBC (CROSSMATCH)

## 2024-09-27 LAB — SAMPLE TO BLOOD BANK

## 2024-09-27 MED ORDER — PROCHLORPERAZINE MALEATE 10 MG PO TABS
10.0000 mg | ORAL_TABLET | Freq: Once | ORAL | Status: AC
  Administered 2024-09-27: 10 mg via ORAL
  Filled 2024-09-27: qty 1

## 2024-09-27 MED ORDER — SODIUM CHLORIDE 0.9 % IV SOLN: INTRAVENOUS | Status: DC

## 2024-09-27 MED ORDER — SODIUM CHLORIDE 0.9 % IV SOLN
400.0000 mg/m2 | Freq: Once | INTRAVENOUS | Status: AC
  Administered 2024-09-27: 700 mg via INTRAVENOUS
  Filled 2024-09-27: qty 20

## 2024-09-27 NOTE — Patient Instructions (Signed)
 [facility] - A DEPT OF [facility]  Discharge Instructions: Thank you for choosing [facility] to provide your oncology and hematology care.   If you have a lab appointment with the Cancer Center, please go directly to the Cancer Center and check in at the registration area.   Wear comfortable clothing and clothing appropriate for easy access to any Portacath or PICC line.   We strive to give you quality time with your provider. You may need to reschedule your appointment if you arrive late (15 or more minutes).  Arriving late affects you and other patients whose appointments are after yours.  Also, if you miss three or more appointments without notifying the office, you may be dismissed from the clinic at the provider's discretion.      For prescription refill requests, have your pharmacy contact our office and allow 72 hours for refills to be completed.    Today you received the following chemotherapy and/or immunotherapy agents: PEMEtrexed  Disodium (ALIMTA )      To help prevent nausea and vomiting after your treatment, we encourage you to take your nausea medication as directed.  BELOW ARE SYMPTOMS THAT SHOULD BE REPORTED IMMEDIATELY: *FEVER GREATER THAN 100.4 F (38 C) OR HIGHER *CHILLS OR SWEATING *NAUSEA AND VOMITING THAT IS NOT CONTROLLED WITH YOUR NAUSEA MEDICATION *UNUSUAL SHORTNESS OF BREATH *UNUSUAL BRUISING OR BLEEDING *URINARY PROBLEMS (pain or burning when urinating, or frequent urination) *BOWEL PROBLEMS (unusual diarrhea, constipation, pain near the anus) TENDERNESS IN MOUTH AND THROAT WITH OR WITHOUT PRESENCE OF ULCERS (sore throat, sores in mouth, or a toothache) UNUSUAL RASH, SWELLING OR PAIN  UNUSUAL VAGINAL DISCHARGE OR ITCHING   Items with * indicate a potential emergency and should be followed up as soon as possible or go to the Emergency Department if any problems should occur.  Please show the CHEMOTHERAPY ALERT  CARD or IMMUNOTHERAPY ALERT CARD at check-in to the Emergency Department and triage nurse.  Should you have questions after your visit or need to cancel or reschedule your appointment, please contact [facility] - A DEPT OF [facility]  Dept: [phone]  and follow the prompts.  Office hours are 8:00 a.m. to 4:30 p.m. Monday - Friday. Please note that voicemails left after 4:00 p.m. may not be returned until the following business day.  We are closed weekends and major holidays. You have access to a nurse at all times for urgent questions. Please call the main number to the clinic Dept: [phone] and follow the prompts.   For any non-urgent questions, you may also contact your provider using MyChart. We now offer e-Visits for anyone 8 and older to request care online for non-urgent symptoms. For details visit [email or web address].   Also download the MyChart app! Go to the app store, search MyChart, open the app, select [facility], and log in with your MyChart username and password.

## 2024-09-28 ENCOUNTER — Encounter: Payer: Self-pay | Admitting: Dietician

## 2024-09-28 ENCOUNTER — Encounter: Attending: Internal Medicine | Admitting: Dietician

## 2024-09-28 VITALS — Wt 147.0 lb

## 2024-09-28 DIAGNOSIS — E118 Type 2 diabetes mellitus with unspecified complications: Secondary | ICD-10-CM | POA: Diagnosis present

## 2024-09-28 NOTE — Patient Instructions (Addendum)
 Avoid skipping meals Eat a sandwich and banana for lunch Glucerna daily Continue to use the Libre

## 2024-09-28 NOTE — Progress Notes (Signed)
 [id number]  Medical Nutrition Therapy  Appointment Start time:  [phone]  Appointment End time:  0935 Patient is here today alone.  He was last seen by this RD 06/15/2024.  He now has dentures that are working well. He had chemo yesterday.  He reports that it doesn't make him sick. He states that his doctor stopped his insulin  because he was having hypoglycemia overnight.   Eyes are very red and states this happened after chemo.  He reports a recent eye exam. Continues to use the [place].  Sensor reading 133 now.  He states it is less 180 most of the time. He has lost 8 lbs in the past 4 months.  He skips lunch almost daily. Nutrition focused physical exam completed which showed mild changes. He drinks glucerna most days.  Samples and coupons provided.  Primary concerns today:  He would like to learn how to keep control of his blood glucose.  He has not been giving his own insulin  injections.  A neighbor and another relative have been giving the shots currently but usually his wife gives the shots.  He can't seen the numbers on the insulin  pen. 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 Notable Signs/Symptoms: walks with a cane - new CGM:  Libre 3 - Sensor reading is now 150 in the office. Much improved from >250 on 02/29/2024. App was put on Niece's phone as patient's phone doesn't support the app.  She did not realize until recently and they have left a message with the MD to obtain a receiver for  patient.  CGM Results from download: 03/05/2024 06/15/24  % Time CGM active:  34%   (Goal >70%) 91%  Average glucose:   142 mg/dL for 14 days 888 x 14  Glucose management indicator:    %   Time in range (70-180 mg/dL):   82 %   (Goal >29%) 100%  Time High (181-250 mg/dL):   16 %   (Goal < 74%) 0  Time Very High (>250 mg/dL):    2 %   (Goal < 5%) 1  Time Low (54-69 mg/dL):   0 %   (Goal <5%) 0  Time Very Low (<54 mg/dL):   0 %   (Goal <8%) 0  %CV (glucose variability)     %  (Goal <36%)    Lifestyle & Dietary Hx Patient lives with his wife.  Patient's wife does the cooking. He gets rides for transportation.  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 He is retired from tree work. He has a lot of support with family and friends  Estimated daily fluid intake: States that he drinks a lot of water  but doesn't know how much Supplements: liquid MVI Sleep: good Stress / self-care: low stress Current average weekly physical activity: exercise bide 20 minutes daily prior to hospitalization.  He walks the dogs daily.    24-Hr Dietary Recall First Meal: sausage, egg sandwich on [employer] bread, coffee with sweet and low and cream Snack: none Second Meal:  turkey sandwich OR banana Snack: rizt crackers  or graham crackers Third Meal: BBQ sandwich and fries Snack: Glucerna Beverages: water , Glucerna, coffee with sweet and low and cream, sugar free punch, sugar free tea  Estimated Energy Needs Calories: 2000-2200 Protein: 70g  NUTRITION DIAGNOSIS  NB-1.1 Food and nutrition-related knowledge deficit As related to balance of carbohydrates, protein, and fat .  As evidenced by patient report and diet hx.   NUTRITION INTERVENTION  Nutrition education (E-1) on the following topics:  Nutritional intake and weight Review of supplement options - coupons provided along with samples Evaluation of CGM  Activity level and blood glucose  Handouts Provided Include - initial visit How to Thrive:  A Guide  for Your Journey with Diabetes by the ADA Meal Plan Card My Plate Snack list Label reading Diabetes Resources   Learning Style & Readiness for Change Teaching method utilized: Visual & Auditory  Demonstrated degree of understanding via: Teach Back  Barriers to learning/adherence to lifestyle change: vision, HOH, fall risk  Plan:  Aim for 4 Carb Choices per meal (60 grams) +/- 1 either way  Aim for 0-1 Carbs per snack if hungry  Include protein in moderation with your meals and snacks Consider reading food labels for Total Carbohydrate of foods Consider  increasing your activity level by using the stationary bike or walking for 20-30 minutes daily as tolerated Continue checking BG at alternate times per day  Continue taking medication as directed by MD  Consider Glucerna daily - samples provided along with coupons.  Alternately he could drink Boost Glucose Control.  MONITORING & EVALUATION Dietary intake, weekly physical activity, and label reading in 3 months.  Next Steps  Patient is to call for questions.

## 2024-09-29 ENCOUNTER — Inpatient Hospital Stay

## 2024-09-29 DIAGNOSIS — T451X5A Adverse effect of antineoplastic and immunosuppressive drugs, initial encounter: Secondary | ICD-10-CM

## 2024-09-29 DIAGNOSIS — Z5111 Encounter for antineoplastic chemotherapy: Secondary | ICD-10-CM | POA: Diagnosis not present

## 2024-09-29 MED ORDER — ACETAMINOPHEN 325 MG PO TABS
650.0000 mg | ORAL_TABLET | Freq: Once | ORAL | Status: AC
  Administered 2024-09-29: 650 mg via ORAL
  Filled 2024-09-29: qty 2

## 2024-09-29 MED ORDER — SODIUM CHLORIDE 0.9% IV SOLUTION
250.0000 mL | INTRAVENOUS | Status: DC
  Administered 2024-09-29: 250 mL via INTRAVENOUS

## 2024-09-29 MED ORDER — DIPHENHYDRAMINE HCL 25 MG PO CAPS
25.0000 mg | ORAL_CAPSULE | Freq: Once | ORAL | Status: AC
  Administered 2024-09-29: 25 mg via ORAL
  Filled 2024-09-29: qty 1

## 2024-09-29 NOTE — Patient Instructions (Signed)
 Blood Transfusion, Adult A blood transfusion is a procedure in which you receive blood or a type of blood cell (blood component) through an IV. You may need a blood transfusion when you have a low blood count, which is a low number of any blood cell. This may result from a bleeding disorder, illness, injury, or surgery. The blood may come from a donor, or you may be able to have your own blood collected and stored (autologous blood donation) before a planned surgery. The blood given in a transfusion may be made up of different blood components. You may receive: Red blood cells. These carry oxygen to the cells in the body. Platelets. These help your blood to clot. Plasma. This is the liquid part of your blood. It carries proteins and other substances throughout the body. White blood cells. These help you fight infections. If you have hemophilia or another clotting disorder, you may also receive other types of blood products. Depending on the type of blood product, this procedure may take 1-4 hours to complete. Tell a health care provider about: Any bleeding problems you have. Any previous reactions you have had during a blood transfusion. Any allergies you have. All medicines you are taking, including vitamins, herbs, eye drops, creams, and over-the-counter medicines. Any surgeries you have had. Any medical conditions you have. Whether you are pregnant or may be pregnant. What are the risks? Talk with your health care provider about risks. The most common problems include: A mild allergic reaction, such as red, swollen areas of skin (hives) and itching. Fever or chills. This may be the body's response to new blood cells received. This may occur during or up to 4 hours after the transfusion. More serious problems may include: A serious allergic reaction that causes difficulty breathing or swelling around the face and lips. Transfusion-associated circulatory overload (TACO), or too much fluid in  the lungs. This may cause breathing problems. Transfusion-related acute lung injury (TRALI), which causes breathing difficulty and low oxygen in the blood. This can occur within hours of the transfusion or several days later. Iron overload. This can happen after receiving many blood transfusions over a period of time. Infection or virus being transmitted. This is rare because donated blood is carefully tested before it is given. Hemolytic transfusion reaction. This is rare. It happens when the body's defense system (immune system)tries to attack the new blood cells. Symptoms may include fever, chills, nausea, low blood pressure, and low back or chest pain. Transfusion-associated graft-versus-host disease (TAGVHD). This is rare. It happens when donated cells attack the body's healthy tissues. What happens before the procedure? You will have a blood test to check your blood type. This test is done to know what kind of blood your body will accept and to match it to the donor blood. If you are going to have a planned surgery, you may be able to do an autologous blood donation. This may be done in case you need to have a transfusion. You will have your temperature, blood pressure, and pulse checked before the transfusion. If you have had an allergic reaction to a transfusion in the past, you may be given medicine to help prevent a reaction. This medicine may be given to you by mouth (orally) or through an IV. What happens during the procedure?  An IV will be inserted into one of your veins. The bag of blood will be attached to your IV. The blood will then enter through your vein. Your temperature, blood pressure,  and pulse will be monitored during the transfusion. This monitoring is done to detect early signs of a transfusion reaction. Tell your nurse right away if you have any of these symptoms during the transfusion: Shortness of breath or trouble breathing. Chest or back pain. Fever or  chills. Itching or hives. If you have any signs or symptoms of a reaction, your transfusion will be stopped and you may be given medicine. When the transfusion is complete, your IV will be removed. Pressure may be applied to the IV site for a few minutes. A bandage (dressing)will be applied. The procedure may vary among health care providers and hospitals. What happens after the procedure? Your temperature, blood pressure, pulse, breathing rate, and blood oxygen level will be monitored until you leave the hospital or clinic. Your blood may be tested to see how you have responded to the transfusion. You may be warmed with fluids or blankets to maintain a normal body temperature. If you receive your blood transfusion in an outpatient setting, you will be told whom to contact to report any reactions. Where to find more information Visit the American Red Cross: redcross.org Summary A blood transfusion is a procedure in which you receive blood or a type of blood cell (blood component) through an IV. The blood given in a transfusion may be made up of different blood components. You may receive red blood cells, platelets, plasma, or white blood cells depending on the condition treated. Your temperature, blood pressure, and pulse will be monitored before, during, and after the transfusion. After the transfusion, your blood may be tested to see how your body has responded. This information is not intended to replace advice given to you by your health care provider. Make sure you discuss any questions you have with your health care provider. Document Revised: 02/05/2022 Document Reviewed: 02/05/2022 Elsevier Patient Education  2024 [employer].

## 2024-10-01 LAB — TYPE AND SCREEN
ABO/RH(D): B POS
Antibody Screen: NEGATIVE
Unit division: 0

## 2024-10-01 LAB — BPAM RBC
Blood Product Expiration Date: 202511272359
ISSUE DATE / TIME: 202511081112
Unit Type and Rh: 5100

## 2024-10-05 ENCOUNTER — Ambulatory Visit: Admitting: Podiatry

## 2024-10-06 ENCOUNTER — Emergency Department (HOSPITAL_COMMUNITY)

## 2024-10-06 ENCOUNTER — Inpatient Hospital Stay (HOSPITAL_COMMUNITY)
Admission: EM | Admit: 2024-10-06 | Discharge: 2024-10-12 | DRG: 602 | Disposition: A | Discharge status: Home Health | Attending: Internal Medicine | Admitting: Internal Medicine

## 2024-10-06 ENCOUNTER — Other Ambulatory Visit: Payer: Self-pay

## 2024-10-06 ENCOUNTER — Encounter (HOSPITAL_COMMUNITY): Payer: Self-pay

## 2024-10-06 DIAGNOSIS — I5032 Chronic diastolic (congestive) heart failure: Secondary | ICD-10-CM | POA: Diagnosis not present

## 2024-10-06 DIAGNOSIS — I442 Atrioventricular block, complete: Secondary | ICD-10-CM | POA: Diagnosis present

## 2024-10-06 DIAGNOSIS — D701 Agranulocytosis secondary to cancer chemotherapy: Secondary | ICD-10-CM

## 2024-10-06 DIAGNOSIS — T451X5A Adverse effect of antineoplastic and immunosuppressive drugs, initial encounter: Secondary | ICD-10-CM | POA: Diagnosis present

## 2024-10-06 DIAGNOSIS — Z635 Disruption of family by separation and divorce: Secondary | ICD-10-CM

## 2024-10-06 DIAGNOSIS — I252 Old myocardial infarction: Secondary | ICD-10-CM

## 2024-10-06 DIAGNOSIS — D61818 Other pancytopenia: Secondary | ICD-10-CM

## 2024-10-06 DIAGNOSIS — D849 Immunodeficiency, unspecified: Secondary | ICD-10-CM | POA: Diagnosis present

## 2024-10-06 DIAGNOSIS — I330 Acute and subacute infective endocarditis: Secondary | ICD-10-CM | POA: Diagnosis not present

## 2024-10-06 DIAGNOSIS — Z825 Family history of asthma and other chronic lower respiratory diseases: Secondary | ICD-10-CM | POA: Diagnosis not present

## 2024-10-06 DIAGNOSIS — Z7901 Long term (current) use of anticoagulants: Secondary | ICD-10-CM

## 2024-10-06 DIAGNOSIS — R52 Pain, unspecified: Secondary | ICD-10-CM | POA: Diagnosis not present

## 2024-10-06 DIAGNOSIS — C3412 Malignant neoplasm of upper lobe, left bronchus or lung: Secondary | ICD-10-CM | POA: Diagnosis present

## 2024-10-06 DIAGNOSIS — Z87891 Personal history of nicotine dependence: Secondary | ICD-10-CM

## 2024-10-06 DIAGNOSIS — Z9842 Cataract extraction status, left eye: Secondary | ICD-10-CM

## 2024-10-06 DIAGNOSIS — Z888 Allergy status to other drugs, medicaments and biological substances status: Secondary | ICD-10-CM

## 2024-10-06 DIAGNOSIS — E872 Acidosis, unspecified: Secondary | ICD-10-CM | POA: Diagnosis present

## 2024-10-06 DIAGNOSIS — R54 Age-related physical debility: Secondary | ICD-10-CM | POA: Diagnosis present

## 2024-10-06 DIAGNOSIS — I428 Other cardiomyopathies: Secondary | ICD-10-CM | POA: Diagnosis present

## 2024-10-06 DIAGNOSIS — I739 Peripheral vascular disease, unspecified: Secondary | ICD-10-CM | POA: Diagnosis present

## 2024-10-06 DIAGNOSIS — B958 Unspecified staphylococcus as the cause of diseases classified elsewhere: Secondary | ICD-10-CM | POA: Insufficient documentation

## 2024-10-06 DIAGNOSIS — D61810 Antineoplastic chemotherapy induced pancytopenia: Secondary | ICD-10-CM | POA: Diagnosis present

## 2024-10-06 DIAGNOSIS — E876 Hypokalemia: Secondary | ICD-10-CM | POA: Diagnosis present

## 2024-10-06 DIAGNOSIS — N1832 Chronic kidney disease, stage 3b: Secondary | ICD-10-CM | POA: Diagnosis present

## 2024-10-06 DIAGNOSIS — I5042 Chronic combined systolic (congestive) and diastolic (congestive) heart failure: Secondary | ICD-10-CM | POA: Diagnosis present

## 2024-10-06 DIAGNOSIS — I130 Hypertensive heart and chronic kidney disease with heart failure and stage 1 through stage 4 chronic kidney disease, or unspecified chronic kidney disease: Secondary | ICD-10-CM | POA: Diagnosis present

## 2024-10-06 DIAGNOSIS — R7881 Bacteremia: Secondary | ICD-10-CM | POA: Diagnosis not present

## 2024-10-06 DIAGNOSIS — L03115 Cellulitis of right lower limb: Principal | ICD-10-CM | POA: Diagnosis present

## 2024-10-06 DIAGNOSIS — I2510 Atherosclerotic heart disease of native coronary artery without angina pectoris: Secondary | ICD-10-CM | POA: Diagnosis present

## 2024-10-06 DIAGNOSIS — L0390 Cellulitis, unspecified: Secondary | ICD-10-CM | POA: Diagnosis present

## 2024-10-06 DIAGNOSIS — Z860100 Personal history of colon polyps, unspecified: Secondary | ICD-10-CM

## 2024-10-06 DIAGNOSIS — H548 Legal blindness, as defined in USA: Secondary | ICD-10-CM | POA: Diagnosis present

## 2024-10-06 DIAGNOSIS — Z95810 Presence of automatic (implantable) cardiac defibrillator: Secondary | ICD-10-CM

## 2024-10-06 DIAGNOSIS — K5900 Constipation, unspecified: Secondary | ICD-10-CM | POA: Diagnosis present

## 2024-10-06 DIAGNOSIS — Z87442 Personal history of urinary calculi: Secondary | ICD-10-CM

## 2024-10-06 DIAGNOSIS — B957 Other staphylococcus as the cause of diseases classified elsewhere: Secondary | ICD-10-CM | POA: Diagnosis present

## 2024-10-06 DIAGNOSIS — Z1152 Encounter for screening for COVID-19: Secondary | ICD-10-CM

## 2024-10-06 DIAGNOSIS — I480 Paroxysmal atrial fibrillation: Secondary | ICD-10-CM | POA: Diagnosis present

## 2024-10-06 DIAGNOSIS — Z9841 Cataract extraction status, right eye: Secondary | ICD-10-CM

## 2024-10-06 DIAGNOSIS — K222 Esophageal obstruction: Secondary | ICD-10-CM | POA: Diagnosis present

## 2024-10-06 DIAGNOSIS — I472 Ventricular tachycardia, unspecified: Secondary | ICD-10-CM | POA: Diagnosis present

## 2024-10-06 DIAGNOSIS — I420 Dilated cardiomyopathy: Secondary | ICD-10-CM | POA: Diagnosis present

## 2024-10-06 DIAGNOSIS — C3490 Malignant neoplasm of unspecified part of unspecified bronchus or lung: Secondary | ICD-10-CM | POA: Diagnosis not present

## 2024-10-06 DIAGNOSIS — Z923 Personal history of irradiation: Secondary | ICD-10-CM

## 2024-10-06 DIAGNOSIS — I38 Endocarditis, valve unspecified: Secondary | ICD-10-CM | POA: Diagnosis not present

## 2024-10-06 DIAGNOSIS — I5030 Unspecified diastolic (congestive) heart failure: Secondary | ICD-10-CM | POA: Diagnosis not present

## 2024-10-06 LAB — COMPREHENSIVE METABOLIC PANEL WITH GFR
ALT: 8 U/L (ref 0–44)
AST: 19 U/L (ref 15–41)
Albumin: 3.2 g/dL — ABNORMAL LOW (ref 3.5–5.0)
Alkaline Phosphatase: 43 U/L (ref 38–126)
Anion gap: 10 (ref 5–15)
BUN: 22 mg/dL (ref 8–23)
CO2: 20 mmol/L — ABNORMAL LOW (ref 22–32)
Calcium: 8.8 mg/dL — ABNORMAL LOW (ref 8.9–10.3)
Chloride: 109 mmol/L (ref 98–111)
Creatinine, Ser: 1.17 mg/dL (ref 0.61–1.24)
GFR, Estimated: >60 mL/min (ref >60)
Glucose, Bld: 120 mg/dL — ABNORMAL HIGH (ref 70–99)
Potassium: 3.9 mmol/L (ref 3.5–5.1)
Sodium: 140 mmol/L (ref 135–145)
Total Bilirubin: 0.8 mg/dL (ref 0.0–1.2)
Total Protein: 6.5 g/dL (ref 6.5–8.1)

## 2024-10-06 LAB — CBC WITH DIFFERENTIAL/PLATELET
Abs Immature Granulocytes: 0.01 K/uL (ref 0.00–0.07)
Basophils Absolute: 0 K/uL (ref 0.0–0.1)
Basophils Relative: 0 %
Eosinophils Absolute: 0 K/uL (ref 0.0–0.5)
Eosinophils Relative: 2 %
HCT: 22.5 % — ABNORMAL LOW (ref 39.0–52.0)
Hemoglobin: 7.5 g/dL — ABNORMAL LOW (ref 13.0–17.0)
Immature Granulocytes: 1 %
Lymphocytes Relative: 21 %
Lymphs Abs: 0.3 K/uL — ABNORMAL LOW (ref 0.7–4.0)
MCH: 32.9 pg (ref 26.0–34.0)
MCHC: 33.3 g/dL (ref 30.0–36.0)
MCV: 98.7 fL (ref 80.0–100.0)
Monocytes Absolute: 0.1 K/uL (ref 0.1–1.0)
Monocytes Relative: 5 %
Neutro Abs: 0.9 K/uL — ABNORMAL LOW (ref 1.7–7.7)
Neutrophils Relative %: 71 %
Platelets: 26 K/uL — CL (ref 150–400)
RBC: 2.28 MIL/uL — ABNORMAL LOW (ref 4.22–5.81)
RDW: 16.1 % — ABNORMAL HIGH (ref 11.5–15.5)
WBC: 1.3 K/uL — CL (ref 4.0–10.5)
nRBC: 0 % (ref 0.0–0.2)

## 2024-10-06 LAB — RESP PANEL BY RT-PCR (RSV, FLU A&B, COVID)  RVPGX2
Influenza A by PCR: NEGATIVE
Influenza B by PCR: NEGATIVE
Resp Syncytial Virus by PCR: NEGATIVE
SARS Coronavirus 2 by RT PCR: NEGATIVE

## 2024-10-06 LAB — LIPASE, BLOOD: Lipase: 11 U/L (ref 11–51)

## 2024-10-06 LAB — TROPONIN T, HIGH SENSITIVITY
Troponin T High Sensitivity: 21 ng/L — ABNORMAL HIGH (ref 0–19)
Troponin T High Sensitivity: 21 ng/L — ABNORMAL HIGH (ref 0–19)

## 2024-10-06 MED ORDER — ONDANSETRON HCL 4 MG PO TABS: 4.0000 mg | ORAL_TABLET | Freq: Four times a day (QID) | ORAL | Status: DC | PRN

## 2024-10-06 MED ORDER — BISACODYL 5 MG PO TBEC: 5.0000 mg | DELAYED_RELEASE_TABLET | Freq: Every day | ORAL | Status: DC | PRN

## 2024-10-06 MED ORDER — ACETAMINOPHEN 325 MG PO TABS: 650.0000 mg | ORAL_TABLET | Freq: Once | ORAL | Status: DC | PRN

## 2024-10-06 MED ORDER — ONDANSETRON HCL 4 MG/2ML IJ SOLN
4.0000 mg | Freq: Four times a day (QID) | INTRAMUSCULAR | Status: DC | PRN
  Administered 2024-10-07 – 2024-10-12 (×5): 4 mg via INTRAVENOUS
  Filled 2024-10-06 (×5): qty 2

## 2024-10-06 MED ORDER — SODIUM CHLORIDE 0.9 % IV SOLN
2.0000 g | Freq: Two times a day (BID) | INTRAVENOUS | Status: DC
  Administered 2024-10-06 – 2024-10-08 (×4): 2 g via INTRAVENOUS
  Filled 2024-10-06 (×3): qty 12.5

## 2024-10-06 MED ORDER — VANCOMYCIN HCL IN DEXTROSE 1-5 GM/200ML-% IV SOLN: 1000.0000 mg | Freq: Once | INTRAVENOUS | Status: DC

## 2024-10-06 MED ORDER — ACETAMINOPHEN 325 MG PO TABS
650.0000 mg | ORAL_TABLET | Freq: Four times a day (QID) | ORAL | Status: DC | PRN
  Administered 2024-10-06 – 2024-10-08 (×3): 650 mg via ORAL
  Filled 2024-10-06 (×5): qty 2

## 2024-10-06 MED ORDER — ACETAMINOPHEN 650 MG RE SUPP
650.0000 mg | Freq: Four times a day (QID) | RECTAL | Status: DC | PRN
Start: 2024-10-06 — End: 2024-10-12

## 2024-10-06 MED ORDER — SODIUM CHLORIDE 0.9 % IV SOLN
2.0000 g | Freq: Once | INTRAVENOUS | Status: DC
  Filled 2024-10-06: qty 12.5

## 2024-10-06 MED ORDER — VANCOMYCIN HCL IN DEXTROSE 1-5 GM/200ML-% IV SOLN
1000.0000 mg | INTRAVENOUS | Status: DC
  Administered 2024-10-07: 1000 mg via INTRAVENOUS
  Filled 2024-10-06: qty 200

## 2024-10-06 MED ORDER — VANCOMYCIN HCL 1500 MG/300ML IV SOLN
1500.0000 mg | Freq: Once | INTRAVENOUS | Status: AC
  Administered 2024-10-06 (×2): 1500 mg via INTRAVENOUS
  Filled 2024-10-06: qty 300

## 2024-10-06 NOTE — ED Notes (Addendum)
 Pt to xray

## 2024-10-06 NOTE — Progress Notes (Signed)
 Pharmacy Antibiotic Note  [name] is a 83 y.o. male admitted on 10/06/2024 with cellulitis. Patient is immunocompromised with recent chemo administration a little over a week ago. Patient afebrile, WBC down to 1.3. Pharmacy has been consulted for vancomycin and cefepime dosing.  Plan: - Start cefepime 2g IV q12h  - Give vancomycin 1500mg  IV x1, then 1000mg  IV q24h (eAUC 510 using Scr 1.17) - Vanc levels PRN - Monitor cultures, renal function, and overall clinical status  - De-escalate abx as able  Height: 5' 7 (170.2 cm) Weight: 67.6 kg (149 lb) IBW/kg (Calculated) : 66.1  Temp (24hrs), Avg:98.1 F (36.7 C), Min:98 F (36.7 C), Max:98.1 F (36.7 C)  Recent Labs  Lab 10/06/24 1432 10/06/24 1543  WBC  --  1.3*  CREATININE 1.17  --     Estimated Creatinine Clearance: 43.2 mL/min (by C-G formula based on SCr of 1.17 mg/dL).    Allergies  Allergen Reactions   Paclitaxel  Other (See Comments) and Hypertension    Pt reported chest pain, back pain, and SOB during first infusion (see notes from 9/23). Infusion discontinued.    Allegra [Fexofenadine] Other (See Comments)    Makes him dizzy and feels like he will fall.   Sonafine [Wound Dressings] Itching    Itching and burning to skin  08/25/23    Antimicrobials this admission: 11/15 cefepime >>  11/15 vancomycin >>   Dose adjustments this admission: N/A  Microbiology results: 11/15 BCx: not yet collected 11/15 resp panel: neg    Thank you for allowing pharmacy to be a part of this patient's care.  Lacinda Moats, PharmD Clinical Pharmacist  11/15/20256:28 PM

## 2024-10-06 NOTE — H&P (Addendum)
 History and Physical    Patient: [name] FMW:[id number] DOB: 08-17-1941 DOA: 10/06/2024 DOS: the patient was seen and examined on 10/06/2024 PCP: [name], MD  Patient coming from: Home  Chief Complaint:  Chief Complaint  Patient presents with   Fatigue   Chest Pain   HPI: [name] is a 83 y.o. male with medical history significant for stage 4 non small cell cancer, atrial fibrillation on Eliquis , AICD placement who presents to the ED today because he was very sick this morning with recurrent vomiting and a persistent left calf pain. The patient last had chemotherapy 2 weeks ago.  He tells me he required a blood transfusion the day after his chemotherapy which is standard.  He also says that routinely he has to get 3 different injections about a week or 2 after his chemotherapy.  He was not able to hold any food down this morning but he denies any fevers or chills.  No diarrhea.  He has been coughing and he does have some pleuritic pain around his abdomen which hurts when he coughs.  He did not vomit up any blood.  The patient says his right lower leg has been hurting pretty significantly for 3 days now.  He does not recall any trauma.  In the emergency department the patient complained of the right leg pain and on examination was found to be hot to the touch and darker than the rest of his skin.  The patient will be admitted to the hospitalist service for right leg cellulitis in the setting of pancytopenia due to chemotherapy.   Review of Systems: As mentioned in the history of present illness. All other systems reviewed and are negative. Past Medical History:  Diagnosis Date   AICD (automatic cardioverter/defibrillator) present    CAD (coronary artery disease) 80% stenosis diag of the LAD, 30% in OM2 branch of LCX in 2009    a. Nonobstructive CAD by cath 11/2011 with the exception of the pre-existing diagonal branch #2 lesion.   Chronic systolic CHF (congestive heart  failure) (HCC)    CKD (chronic kidney disease) stage 3, GFR 30-59 ml/min (HCC)    Colon polyp, hyperplastic    History of kidney stones    History of radiation therapy    Left Lung- 08/17/23-09/29/23- Dr. Lynwood Nasuti   History of stress test 06/01/2012   Normal myocardial perfusion study. compared to the previous study there is no significant change. this is a low risk scan   Hypertension    Legally blind    both eyes   Myocardial infarction [facility]) 11/22/2011   NICM (nonischemic cardiomyopathy) (HCC)    a. Remote hx of dilated NICM with EF ranging 20-45%, including normal EF by echo (55-60%) in 2014.   Peripheral arterial disease    a. 06/2014: ABI right 0.99, left 1.2, LE dopplers revealing an occluded right posterior tibial. As symptoms were not felt r/t claudication, no further w/u at the time.   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 Requiring discontinuation of BB dose.   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   Past Surgical History:  Procedure Laterality Date   BACK SURGERY     BIV UPGRADE N/A 11/08/2019   Procedure: BIV UPGRADE;  Surgeon: Waddell Danelle ORN, MD;  Location: [facility] INVASIVE CV LAB;  Service: Cardiovascular;  Laterality: N/A;   BRONCHIAL  BIOPSY  07/05/2023   Procedure: BRONCHIAL BIOPSIES;  Surgeon: Shelah Lamar RAMAN, MD;  Location: [facility] ENDOSCOPY;  Service: Pulmonary;;   BRONCHIAL BRUSHINGS  07/05/2023   Procedure: BRONCHIAL BRUSHINGS;  Surgeon: Shelah Lamar RAMAN, MD;  Location: [facility] ENDOSCOPY;  Service: Pulmonary;;   BRONCHIAL NEEDLE ASPIRATION BIOPSY  07/05/2023   Procedure: BRONCHIAL NEEDLE ASPIRATION BIOPSIES;  Surgeon: Shelah Lamar RAMAN, MD;  Location: [facility] ENDOSCOPY;  Service: Pulmonary;;   BRONCHIAL WASHINGS  07/05/2023   Procedure: BRONCHIAL WASHINGS;  Surgeon: Shelah Lamar RAMAN, MD;  Location: [facility] ENDOSCOPY;  Service: Pulmonary;;   CARDIAC  CATHETERIZATION  11/2011   CARDIAC CATHETERIZATION  11/2011   didn't demonstrate high grade obstructive disease to account for his LV dysfunction.   CATARACT EXTRACTION, BILATERAL  1990's   CYSTOSCOPY     CYSTOSCOPY WITH BIOPSY Bilateral 08/03/2021   Procedure: CYSTOSCOPY WITH BLADDER BIOPSY, BILATERAL RETROGRADE PYELOGRAM;  Surgeon: Carolee Sherwood JONETTA DOUGLAS, MD;  Location: [facility] ORS;  Service: Urology;  Laterality: Bilateral;  REQUESTING 45 MINS   CYSTOSCOPY WITH URETHRAL DILATATION N/A 05/04/2013   Procedure: CYSTOSCOPY WITH URETHRAL DILATATION;  Surgeon: Alm RAMAN Molt, MD;  Location: [facility] NEURO ORS;  Service: Neurosurgery;  Laterality: N/A;  with insertion of foley catheter   EP study and ablation of VT  7/13   PVC focus mapped to the right coronary cusp of the aorta, limited ablation performed due to proximity of the focus to the right coronary artery   EYE SURGERY     FINE NEEDLE ASPIRATION  07/05/2023   Procedure: FINE NEEDLE ASPIRATION (FNA) LINEAR;  Surgeon: Shelah Lamar RAMAN, MD;  Location: [facility] ENDOSCOPY;  Service: Pulmonary;;   LEFT HEART CATH AND CORONARY ANGIOGRAPHY N/A 11/07/2019   Procedure: LEFT HEART CATH AND CORONARY ANGIOGRAPHY;  Surgeon: Darron Deatrice LABOR, MD;  Location: [facility] CV LAB;  Service: Cardiovascular;  Laterality: N/A;   LEFT HEART CATHETERIZATION WITH CORONARY ANGIOGRAM N/A 11/25/2011   Procedure: LEFT HEART CATHETERIZATION WITH CORONARY ANGIOGRAM;  Surgeon: Alm LELON Clay, MD;  Location: [facility] CATH LAB;  Service: Cardiovascular;  Laterality: N/A;   PACEMAKER IMPLANT N/A 07/12/2018   Procedure: PACEMAKER IMPLANT;  Surgeon: Francyne Headland, MD;  Location: [facility] INVASIVE CV LAB;  Service: Cardiovascular;  Laterality: N/A;   POSTERIOR FUSION LUMBAR SPINE  1979   ROTATOR CUFF REPAIR  2000's   left   V-TACH ABLATION N/A 06/06/2012   Procedure: V-TACH ABLATION;  Surgeon: Lynwood Rakers, MD;  Location: [facility] CATH LAB;  Service: Cardiovascular;  Laterality: N/A;   VIDEO BRONCHOSCOPY WITH  ENDOBRONCHIAL ULTRASOUND N/A 07/05/2023   Procedure: VIDEO BRONCHOSCOPY WITH ENDOBRONCHIAL ULTRASOUND;  Surgeon: Shelah Lamar RAMAN, MD;  Location: [facility] ENDOSCOPY;  Service: Pulmonary;  Laterality: N/A;   Social History:  reports that he quit smoking about 24 years ago. His smoking use included cigarettes. He started smoking about 74 years ago. He has been exposed to tobacco smoke. He has never used smokeless tobacco. He reports that he does not drink alcohol and does not use drugs.  Allergies  Allergen Reactions   Paclitaxel  Other (See Comments) and Hypertension    Pt reported chest pain, back pain, and SOB during first infusion (see notes from 9/23). Infusion discontinued.    Allegra [Fexofenadine] Other (See Comments)    Makes him dizzy and feels like he will fall.   Sonafine [Wound Dressings] Itching    Itching and burning to skin  08/25/23    Family History  Problem Relation Age of Onset   Asthma Mother  Other Other        Unsure if any heart disease in his family.    Prior to Admission medications   Medication Sig Start Date End Date Taking? Authorizing Provider  apixaban  (ELIQUIS ) 2.5 MG TABS tablet Take 1 tablet (2.5 mg total) by mouth 2 (two) times daily. 03/12/24   Croitoru, Mihai, MD  BREO ELLIPTA  100-25 MCG/INH AEPB Inhale 1 puff into the lungs every morning. 05/07/20   [provider]  brimonidine  (ALPHAGAN ) 0.2 % ophthalmic solution Place 1 drop into both eyes 2 (two) times daily. 03/29/23   [provider]  Continuous Glucose Sensor (FREESTYLE LIBRE 3 PLUS SENSOR) MISC SMARTSIG:Topical Every 3 Months 07/31/24   [provider]  cycloSPORINE  (RESTASIS ) 0.05 % ophthalmic emulsion Place 1 drop into both eyes 2 (two) times daily.    [provider]  dorzolamide -timolol  (COSOPT ) 22.3-6.8 MG/ML ophthalmic solution Place 1 drop into both eyes 2 (two) times daily. 03/14/20   [provider]  doxycycline  (VIBRAMYCIN ) 100 MG capsule Take 1 capsule  (100 mg total) by mouth 2 (two) times daily. 07/12/24   Dreama Longs, MD  ENTRESTO  24-26 MG TAKE 1 TABLET BY MOUTH TWICE DAILY 11/08/23   Burnard Debby LABOR, MD  folic acid  (FOLVITE ) 1 MG tablet Take 1 tablet (1 mg total) by mouth daily. Start 7 days before pemetrexed  chemotherapy. Continue until 21 days after pemetrexed  completed. 06/13/24   Sherrod Sherrod, MD  furosemide  (LASIX ) 20 MG tablet Take 0.5 tablets (10 mg total) by mouth daily. 02/27/24   Lue Elsie BROCKS, MD  Glucagon , rDNA, (GLUCAGON  EMERGENCY) 1 MG KIT Inject 1 mg into the skin as needed for up to 2 doses (Severe low blood sugar). 02/26/24   Lue Elsie BROCKS, MD  hydrocortisone  1 % lotion Apply 1 Application topically 2 (two) times daily. 07/25/24   Heilingoetter, Cassandra L, PA-C  hydrOXYzine  (ATARAX ) 25 MG tablet Take 1 tablet (25 mg total) by mouth every 6 (six) hours as needed for itching. Patient not taking: Reported on 09/29/2024 07/12/24   Dreama Longs, MD  insulin  isophane & regular human KwikPen (HUMULIN 70/30 MIX) (70-30) 100 UNIT/ML KwikPen Inject 25 Units into the skin in the morning and at bedtime. Patient taking differently: Inject 5 Units into the skin daily at 6 (six) AM. 02/27/24   Lue Elsie BROCKS, MD  lidocaine  (LIDODERM ) 5 % Place 1 patch onto the skin daily. Remove & Discard patch within 12 hours or as directed by MD 06/10/24   Caleen Burgess BROCKS, MD  metoprolol  succinate (TOPROL -XL) 25 MG 24 hr tablet Take 1 tablet (25 mg total) by mouth daily. Patient not taking: Reported on 09/29/2024 08/02/24   Croitoru, Mihai, MD  omeprazole  (PRILOSEC) 20 MG capsule Take 1 capsule (20 mg total) by mouth daily. 07/25/24   Heilingoetter, Cassandra L, PA-C  ondansetron  (ZOFRAN ) 8 MG tablet Take 1 tablet (8 mg total) by mouth every 8 (eight) hours as needed for nausea or vomiting. Start on the third day after carboplatin . Patient not taking: Reported on 08/02/2024 06/13/24   Sherrod Sherrod, MD  ROCKLATAN  0.02-0.005 % SOLN Place 1  drop into both eyes at bedtime.    [provider]  spironolactone  (ALDACTONE ) 25 MG tablet Take 12.5 mg by mouth daily. 03/15/24   [provider]  triamcinolone  (KENALOG ) 0.025 % ointment Apply 1 Application topically 2 (two) times daily. Patient not taking: Reported on 09/29/2024 08/16/24   Heilingoetter, Cassandra L, PA-C  VYZULTA 0.024 % SOLN Apply 1  drop to eye at bedtime. 07/18/24   [provider]    Physical Exam: Vitals:   10/06/24 1200 10/06/24 1211 10/06/24 1500 10/06/24 1615  BP:  (!) 125/58 120/61   Pulse:  78 97   Resp:  17 16   Temp:  98.1 F (36.7 C)  98 F (36.7 C)  TempSrc:  Oral  Oral  SpO2:  95% 100%   Weight: 67.6 kg     Height: 5' 7 (1.702 m)       Physical Exam:  General: No acute distress, thin, malnourished man  HEENT: Normocephalic, atraumatic, PERRL Cardiovascular: Normal rate and rhythm. Distal pulses intact. Pulmonary: Normal pulmonary effort, normal breath sounds Gastrointestinal: Nondistended abdomen, soft, non-tender, normoactive bowel sounds Musculoskeletal:No lower ext edema Lymphadenopathy: No cervical LAD. Skin: right outer calf is warm to the touch and slightly red/darker  Neuro: No focal deficits noted, AAOx3. PSYCH: Attentive and cooperative    Results for orders placed or performed during the hospital encounter of 10/06/24 (from the past 24 hours)  Resp panel by RT-PCR (RSV, Flu A&B, Covid) Anterior Nasal Swab     Status: None   Collection Time: 10/06/24  1:42 PM   Specimen: Anterior Nasal Swab  Result Value Ref Range   SARS Coronavirus 2 by RT PCR NEGATIVE NEGATIVE   Influenza A by PCR NEGATIVE NEGATIVE   Influenza B by PCR NEGATIVE NEGATIVE   Resp Syncytial Virus by PCR NEGATIVE NEGATIVE  Troponin T, High Sensitivity     Status: Abnormal   Collection Time: 10/06/24  2:32 PM  Result Value Ref Range   Troponin T High Sensitivity 21 (H) 0 - 19 ng/L  Comprehensive metabolic panel     Status: Abnormal    Collection Time: 10/06/24  2:32 PM  Result Value Ref Range   Sodium 140 135 - 145 mmol/L   Potassium 3.9 3.5 - 5.1 mmol/L   Chloride 109 98 - 111 mmol/L   CO2 20 (L) 22 - 32 mmol/L   Glucose, Bld 120 (H) 70 - 99 mg/dL   BUN 22 8 - 23 mg/dL   Creatinine, Ser 8.82 0.61 - 1.24 mg/dL   Calcium  8.8 (L) 8.9 - 10.3 mg/dL   Total Protein 6.5 6.5 - 8.1 g/dL   Albumin  3.2 (L) 3.5 - 5.0 g/dL   AST 19 15 - 41 U/L   ALT 8 0 - 44 U/L   Alkaline Phosphatase 43 38 - 126 U/L   Total Bilirubin 0.8 0.0 - 1.2 mg/dL   GFR, Estimated >39 >39 mL/min   Anion gap 10 5 - 15  Lipase, blood     Status: None   Collection Time: 10/06/24  2:32 PM  Result Value Ref Range   Lipase 11 11 - 51 U/L  Troponin T, High Sensitivity     Status: Abnormal   Collection Time: 10/06/24  2:39 PM  Result Value Ref Range   Troponin T High Sensitivity 21 (H) 0 - 19 ng/L  CBC with Differential     Status: Abnormal (Preliminary result)   Collection Time: 10/06/24  3:43 PM  Result Value Ref Range   WBC 1.3 (LL) 4.0 - 10.5 K/uL   RBC 2.28 (L) 4.22 - 5.81 MIL/uL   Hemoglobin 7.5 (L) 13.0 - 17.0 g/dL   HCT 77.4 (L) 60.9 - 47.9 %   MCV 98.7 80.0 - 100.0 fL   MCH 32.9 26.0 - 34.0 pg   MCHC 33.3 30.0 - 36.0 g/dL   RDW  16.1 (H) 11.5 - 15.5 %   Platelets 26 (LL) 150 - 400 K/uL   nRBC 0.0 0.0 - 0.2 %   Neutrophils Relative % PENDING %   Neutro Abs PENDING 1.7 - 7.7 K/uL   Band Neutrophils PENDING %   Lymphocytes Relative PENDING %   Lymphs Abs PENDING 0.7 - 4.0 K/uL   Monocytes Relative PENDING %   Monocytes Absolute PENDING 0.1 - 1.0 K/uL   Eosinophils Relative PENDING %   Eosinophils Absolute PENDING 0.0 - 0.5 K/uL   Basophils Relative PENDING %   Basophils Absolute PENDING 0.0 - 0.1 K/uL   WBC Morphology PENDING    RBC Morphology PENDING    Smear Review PENDING    Other PENDING %   nRBC PENDING 0 /100 WBC   Metamyelocytes Relative PENDING %   Myelocytes PENDING %   Promyelocytes Relative PENDING %   Blasts PENDING %    Immature Granulocytes PENDING %   Abs Immature Granulocytes PENDING 0.00 - 0.07 K/uL    Data Reviewed:  Results for orders placed or performed during the hospital encounter of 10/06/24 (from the past 24 hours)  Resp panel by RT-PCR (RSV, Flu A&B, Covid) Anterior Nasal Swab     Status: None   Collection Time: 10/06/24  1:42 PM   Specimen: Anterior Nasal Swab  Result Value Ref Range   SARS Coronavirus 2 by RT PCR NEGATIVE NEGATIVE   Influenza A by PCR NEGATIVE NEGATIVE   Influenza B by PCR NEGATIVE NEGATIVE   Resp Syncytial Virus by PCR NEGATIVE NEGATIVE  Troponin T, High Sensitivity     Status: Abnormal   Collection Time: 10/06/24  2:32 PM  Result Value Ref Range   Troponin T High Sensitivity 21 (H) 0 - 19 ng/L  Comprehensive metabolic panel     Status: Abnormal   Collection Time: 10/06/24  2:32 PM  Result Value Ref Range   Sodium 140 135 - 145 mmol/L   Potassium 3.9 3.5 - 5.1 mmol/L   Chloride 109 98 - 111 mmol/L   CO2 20 (L) 22 - 32 mmol/L   Glucose, Bld 120 (H) 70 - 99 mg/dL   BUN 22 8 - 23 mg/dL   Creatinine, Ser 8.82 0.61 - 1.24 mg/dL   Calcium  8.8 (L) 8.9 - 10.3 mg/dL   Total Protein 6.5 6.5 - 8.1 g/dL   Albumin  3.2 (L) 3.5 - 5.0 g/dL   AST 19 15 - 41 U/L   ALT 8 0 - 44 U/L   Alkaline Phosphatase 43 38 - 126 U/L   Total Bilirubin 0.8 0.0 - 1.2 mg/dL   GFR, Estimated >39 >39 mL/min   Anion gap 10 5 - 15  Lipase, blood     Status: None   Collection Time: 10/06/24  2:32 PM  Result Value Ref Range   Lipase 11 11 - 51 U/L  Troponin T, High Sensitivity     Status: Abnormal   Collection Time: 10/06/24  2:39 PM  Result Value Ref Range   Troponin T High Sensitivity 21 (H) 0 - 19 ng/L  CBC with Differential     Status: Abnormal (Preliminary result)   Collection Time: 10/06/24  3:43 PM  Result Value Ref Range   WBC 1.3 (LL) 4.0 - 10.5 K/uL   RBC 2.28 (L) 4.22 - 5.81 MIL/uL   Hemoglobin 7.5 (L) 13.0 - 17.0 g/dL   HCT 77.4 (L) 60.9 - 47.9 %   MCV 98.7 80.0 - 100.0 fL  MCH 32.9 26.0 - 34.0 pg   MCHC 33.3 30.0 - 36.0 g/dL   RDW 83.8 (H) 88.4 - 84.4 %   Platelets 26 (LL) 150 - 400 K/uL   nRBC 0.0 0.0 - 0.2 %   Neutrophils Relative % PENDING %   Neutro Abs PENDING 1.7 - 7.7 K/uL   Band Neutrophils PENDING %   Lymphocytes Relative PENDING %   Lymphs Abs PENDING 0.7 - 4.0 K/uL   Monocytes Relative PENDING %   Monocytes Absolute PENDING 0.1 - 1.0 K/uL   Eosinophils Relative PENDING %   Eosinophils Absolute PENDING 0.0 - 0.5 K/uL   Basophils Relative PENDING %   Basophils Absolute PENDING 0.0 - 0.1 K/uL   WBC Morphology PENDING    RBC Morphology PENDING    Smear Review PENDING    Other PENDING %   nRBC PENDING 0 /100 WBC   Metamyelocytes Relative PENDING %   Myelocytes PENDING %   Promyelocytes Relative PENDING %   Blasts PENDING %   Immature Granulocytes PENDING %   Abs Immature Granulocytes PENDING 0.00 - 0.07 K/uL     Assessment and Plan: Right leg cellulitis - Vancomycin and Maxipime because he is immunocompromised.  He has no clear trauma of the leg. Dopplers negative for DVT - Monitor  Pancytopenia due to chemo - plts 29, wbc 1.3, hgb 7.5 - consult Dr. Gatha to see if any cell booster injections as the patient calls them are warranted - Consider transfusion  3.  History of paroxysmal atrial fibrillation on Eliquis  - Will hold the patient's Eliquis  tonight as his platelets are 26.   Advance Care Planning:   Code Status: Prior  The patient names his wife as a runner, broadcasting/film/video and he wants to be full code.  Consults: Oncology  Family Communication: None  Severity of Illness: The appropriate patient status for this patient is INPATIENT. Inpatient status is judged to be reasonable and necessary in order to provide the required intensity of service to ensure the patient's safety. The patient's presenting symptoms, physical exam findings, and initial radiographic and laboratory data in the context of their chronic  comorbidities is felt to place them at high risk for further clinical deterioration. Furthermore, it is not anticipated that the patient will be medically stable for discharge from the hospital within 2 midnights of admission.   * I certify that at the point of admission it is my clinical judgment that the patient will require inpatient hospital care spanning beyond 2 midnights from the point of admission due to high intensity of service, high risk for further deterioration and high frequency of surveillance required.*  Author: ARTHEA CHILD, MD 10/06/2024 5:35 PM  For on call review [email or web address].

## 2024-10-06 NOTE — ED Triage Notes (Signed)
 Pt BIB ems for experiencing increase fatigue and pain above his pacemaker for 2 weeks now. Is a stage 4 lung cancer pt, takes eliquis . Given 500 ml NS by ems. Feels slight pain in the right leg as well. A&Ox4 VS stable

## 2024-10-06 NOTE — ED Notes (Signed)
Called report to floor nurse

## 2024-10-06 NOTE — ED Provider Notes (Signed)
 Care transferred to me.  DVT ultrasound is negative.  On my exam, the patient has darkened erythema along his lateral lower leg.  It is tender to palpation.  He is also cytopenic and had his most recent chemotherapy a little over a week ago.  Due to this immunocompromise I think you will need IV antibiotics and admission.  He has multiple other concerning risk factors such as diabetes.  I have consulted Dr. Claiborne for admission.  Also consulted oncology, Dr. Autumn, who will review the chart and make recommendations.  Oncology will see.  For now he just advises the broad antibiotics.   Freddi Hamilton, MD 10/06/24 [phone]

## 2024-10-06 NOTE — ED Provider Notes (Signed)
 [place] EMERGENCY DEPARTMENT AT [facility] Provider Note   CSN: [id number] Arrival date & time: 10/06/24  1137     Patient presents with: Fatigue and Chest Pain   [name] is a 83 y.o. male presenting to the ED with complaint of generalized fatigue, ongoing for about 2 weeks, intermittent chest pains, also reporting pain in his right calf.  He is on Eliquis .  He has a history of stage IV lung cancer.  He says he has had a poor appetite  He does have a pacemaker, hx of A Fib on eliquis   External records reviewed including oncology evaluation from November 6, 2 weeks ago, where he was noted to have stage III non-small cell lung carcinoma, adenocarcinoma  {Add pertinent medical, surgical, social history, OB history to HPI:32947} HPI     Prior to Admission medications   Medication Sig Start Date End Date Taking? Authorizing Provider  apixaban  (ELIQUIS ) 2.5 MG TABS tablet Take 1 tablet (2.5 mg total) by mouth 2 (two) times daily. 03/12/24   Croitoru, Mihai, MD  BREO ELLIPTA  100-25 MCG/INH AEPB Inhale 1 puff into the lungs every morning. 05/07/20   [provider]  brimonidine  (ALPHAGAN ) 0.2 % ophthalmic solution Place 1 drop into both eyes 2 (two) times daily. 03/29/23   [provider]  Continuous Glucose Sensor (FREESTYLE LIBRE 3 PLUS SENSOR) MISC SMARTSIG:Topical Every 3 Months 07/31/24   [provider]  cycloSPORINE  (RESTASIS ) 0.05 % ophthalmic emulsion Place 1 drop into both eyes 2 (two) times daily.    [provider]  dorzolamide -timolol  (COSOPT ) 22.3-6.8 MG/ML ophthalmic solution Place 1 drop into both eyes 2 (two) times daily. 03/14/20   [provider]  doxycycline  (VIBRAMYCIN ) 100 MG capsule Take 1 capsule (100 mg total) by mouth 2 (two) times daily. 07/12/24   Dreama Longs, MD  ENTRESTO  24-26 MG TAKE 1 TABLET BY MOUTH TWICE DAILY 11/08/23   Burnard Debby LABOR, MD  folic acid  (FOLVITE ) 1 MG tablet Take 1 tablet (1 mg  total) by mouth daily. Start 7 days before pemetrexed  chemotherapy. Continue until 21 days after pemetrexed  completed. 06/13/24   Sherrod Sherrod, MD  furosemide  (LASIX ) 20 MG tablet Take 0.5 tablets (10 mg total) by mouth daily. 02/27/24   Lue Elsie BROCKS, MD  Glucagon , rDNA, (GLUCAGON  EMERGENCY) 1 MG KIT Inject 1 mg into the skin as needed for up to 2 doses (Severe low blood sugar). 02/26/24   Lue Elsie BROCKS, MD  hydrocortisone  1 % lotion Apply 1 Application topically 2 (two) times daily. 07/25/24   Heilingoetter, Cassandra L, PA-C  hydrOXYzine  (ATARAX ) 25 MG tablet Take 1 tablet (25 mg total) by mouth every 6 (six) hours as needed for itching. Patient not taking: Reported on 09/29/2024 07/12/24   Dreama Longs, MD  insulin  isophane & regular human KwikPen (HUMULIN 70/30 MIX) (70-30) 100 UNIT/ML KwikPen Inject 25 Units into the skin in the morning and at bedtime. Patient taking differently: Inject 5 Units into the skin daily at 6 (six) AM. 02/27/24   Lue Elsie BROCKS, MD  lidocaine  (LIDODERM ) 5 % Place 1 patch onto the skin daily. Remove & Discard patch within 12 hours or as directed by MD 06/10/24   Caleen Burgess BROCKS, MD  metoprolol  succinate (TOPROL -XL) 25 MG 24 hr tablet Take 1 tablet (25 mg total) by mouth daily. Patient not taking: Reported on 09/29/2024 08/02/24   Croitoru, Mihai, MD  omeprazole  (PRILOSEC) 20 MG capsule Take 1 capsule (20 mg total) by  mouth daily. 07/25/24   Heilingoetter, Cassandra L, PA-C  ondansetron  (ZOFRAN ) 8 MG tablet Take 1 tablet (8 mg total) by mouth every 8 (eight) hours as needed for nausea or vomiting. Start on the third day after carboplatin . Patient not taking: Reported on 08/02/2024 06/13/24   Sherrod Sherrod, MD  ROCKLATAN  0.02-0.005 % SOLN Place 1 drop into both eyes at bedtime.    [provider]  spironolactone  (ALDACTONE ) 25 MG tablet Take 12.5 mg by mouth daily. 03/15/24   [provider]  triamcinolone  (KENALOG ) 0.025 % ointment Apply 1  Application topically 2 (two) times daily. Patient not taking: Reported on 09/29/2024 08/16/24   Heilingoetter, Cassandra L, PA-C  VYZULTA 0.024 % SOLN Apply 1 drop to eye at bedtime. 07/18/24   [provider]    Allergies: Paclitaxel , Allegra [fexofenadine], and Sonafine [wound dressings]    Review of Systems  Updated Vital Signs BP (!) 125/58 (BP Location: Right Arm)   Pulse 78   Temp 98.1 F (36.7 C) (Oral)   Resp 17   Ht 5' 7 (1.702 m)   Wt 67.6 kg   SpO2 95%   BMI 23.34 kg/m   Physical Exam Constitutional:      General: He is not in acute distress. HENT:     Head: Normocephalic and atraumatic.  Eyes:     Conjunctiva/sclera: Conjunctivae normal.     Pupils: Pupils are equal, round, and reactive to light.  Cardiovascular:     Rate and Rhythm: Normal rate and regular rhythm.  Pulmonary:     Effort: Pulmonary effort is normal. No respiratory distress.  Abdominal:     General: There is no distension.     Tenderness: There is no abdominal tenderness.  Musculoskeletal:     Comments: Tenderness of the right calf  Skin:    General: Skin is warm and dry.  Neurological:     General: No focal deficit present.     Mental Status: He is alert. Mental status is at baseline.  Psychiatric:        Mood and Affect: Mood normal.        Behavior: Behavior normal.     (all labs ordered are listed, but only abnormal results are displayed) Labs Reviewed  COMPREHENSIVE METABOLIC PANEL WITH GFR - Abnormal; Notable for the following components:      Result Value   CO2 20 (*)    Glucose, Bld 120 (*)    Calcium  8.8 (*)    Albumin  3.2 (*)    All other components within normal limits  TROPONIN T, HIGH SENSITIVITY - Abnormal; Notable for the following components:   Troponin T High Sensitivity 21 (*)    All other components within normal limits  TROPONIN T, HIGH SENSITIVITY - Abnormal; Notable for the following components:   Troponin T High Sensitivity 21 (*)    All other  components within normal limits  RESP PANEL BY RT-PCR (RSV, FLU A&B, COVID)  RVPGX2  LIPASE, BLOOD  CBC    EKG: EKG Interpretation Date/Time:  Saturday October 06 2024 13:42:54 EST Ventricular Rate:  77 PR Interval:    QRS Duration:  159 QT Interval:  447 QTC Calculation: 506 R Axis:   256  Text Interpretation: Atrial fibrillation Ventricular premature complex Right bundle branch block Abnormal lateral Q waves Confirmed by Cottie Cough [phone]) on 10/06/2024 3:08:25 PM  Radiology: [facility] Chest 2 View Result Date: 10/06/2024 EXAM: 2 VIEW(S) XRAY OF THE CHEST 10/06/2024 02:13:00 PM COMPARISON: 07/12/2024 CLINICAL  HISTORY: chest pain FINDINGS: LINES, TUBES AND DEVICES: Stable left subclavian AICD. LUNGS AND PLEURA: Persistent lingular focal masslike consolidation. No pleural effusion. No pneumothorax. HEART AND MEDIASTINUM: AP window is obscured, suggesting mediastinal adenopathy as before. No acute abnormality of the cardiac silhouette. BONES AND SOFT TISSUES: Thoracolumbar fixation hardware partially visualized without acute finding. No acute osseous abnormality. IMPRESSION: 1. Persistent lingular focal masslike consolidation, unchanged from prior study. 2. AP window is obscured suggesting mediastinal adenopathy, unchanged from prior study. Electronically signed by: Dayne Hassell MD 10/06/2024 02:20 PM EST RP Workstation: [id number]    {Document cardiac monitor, telemetry assessment procedure when appropriate:32947} Procedures   Medications Ordered in the ED - No data to display    {Click here for ABCD2, HEART and other calculators REFRESH Note before signing:1}                              Medical Decision Making Amount and/or Complexity of Data Reviewed Labs: ordered. Radiology: ordered. ECG/medicine tests: ordered.   This patient presents to the ED with concern for general fatigue, right calf pain, poor appetite. This involves an extensive number of treatment options, and is a  complaint that carries with it a high risk of complications and morbidity.  The differential diagnosis includes anemia versus metabolic derangement versus DVT versus PE or other infection versus other  Viral illness including COVID-19 is also on the differential  Co-morbidities that complicate the patient evaluation: History of malignancy at risk of thrombosis  Additional history obtained from patient's wife at bedside  External records from outside source obtained and reviewed including   I ordered and personally interpreted labs.  The pertinent results include: Troponin 21, CBC pending, COVID and flu are negative, lipase and FTs within normal limits  I ordered imaging studies including x-ray of the chest, DVT study I independently visualized and interpreted imaging which showed no emergent thoracic finding, vascular ultrasound pending at signout I agree with the radiologist interpretation  The patient was maintained on a cardiac monitor.  I personally viewed and interpreted the cardiac monitored which showed an underlying rhythm of: V paced rhythm  Per my interpretation the patient's ECG shows paced V paced rhythm  I have reviewed the patients home medicines and have made adjustments as needed  Patient is signed out to Dr Glendia Breeding pending follow up on DVT study, pacemaker interrogation, repeat troponin    {Document critical care time when appropriate  Document review of labs and clinical decision tools ie [id number], etc  Document your independent review of radiology images and any outside records  Document your discussion with family members, caretakers and with consultants  Document social determinants of health affecting pt's care  Document your decision making why or why not admission, treatments were needed:32947:::1}   Final diagnoses:  Chest pain, unspecified type  Psychogenic general fatigue  Right leg pain    ED Discharge Orders     None

## 2024-10-06 NOTE — Progress Notes (Signed)
 VASCULAR LAB    Right lower extremity venous duplex has been performed.  See CV proc for preliminary results.  Gave verbal report to Dr. Freddi LIS, [facility], RVT 10/06/2024, 5:07 PM

## 2024-10-07 DIAGNOSIS — D61818 Other pancytopenia: Secondary | ICD-10-CM

## 2024-10-07 DIAGNOSIS — T451X5A Adverse effect of antineoplastic and immunosuppressive drugs, initial encounter: Secondary | ICD-10-CM | POA: Diagnosis not present

## 2024-10-07 DIAGNOSIS — D701 Agranulocytosis secondary to cancer chemotherapy: Secondary | ICD-10-CM | POA: Diagnosis not present

## 2024-10-07 DIAGNOSIS — L03115 Cellulitis of right lower limb: Principal | ICD-10-CM

## 2024-10-07 LAB — BLOOD CULTURE ID PANEL (REFLEXED) - BCID2
A.calcoaceticus-baumannii: NOT DETECTED
Bacteroides fragilis: NOT DETECTED
Candida albicans: NOT DETECTED
Candida auris: NOT DETECTED
Candida glabrata: NOT DETECTED
Candida krusei: NOT DETECTED
Candida parapsilosis: NOT DETECTED
Candida tropicalis: NOT DETECTED
Cryptococcus neoformans/gattii: NOT DETECTED
Enterobacter cloacae complex: NOT DETECTED
Enterobacterales: NOT DETECTED
Enterococcus Faecium: NOT DETECTED
Enterococcus faecalis: NOT DETECTED
Escherichia coli: NOT DETECTED
Haemophilus influenzae: NOT DETECTED
Klebsiella aerogenes: NOT DETECTED
Klebsiella oxytoca: NOT DETECTED
Klebsiella pneumoniae: NOT DETECTED
Listeria monocytogenes: NOT DETECTED
Methicillin resistance mecA/C: NOT DETECTED
Neisseria meningitidis: NOT DETECTED
Proteus species: NOT DETECTED
Pseudomonas aeruginosa: NOT DETECTED
Salmonella species: NOT DETECTED
Serratia marcescens: NOT DETECTED
Staphylococcus aureus (BCID): NOT DETECTED
Staphylococcus epidermidis: DETECTED — AB
Staphylococcus lugdunensis: DETECTED — AB
Staphylococcus species: DETECTED — AB
Stenotrophomonas maltophilia: NOT DETECTED
Streptococcus agalactiae: NOT DETECTED
Streptococcus pneumoniae: NOT DETECTED
Streptococcus pyogenes: NOT DETECTED
Streptococcus species: NOT DETECTED

## 2024-10-07 LAB — BASIC METABOLIC PANEL WITH GFR
Anion gap: 8 (ref 5–15)
BUN: 22 mg/dL (ref 8–23)
CO2: 20 mmol/L — ABNORMAL LOW (ref 22–32)
Calcium: 8.8 mg/dL — ABNORMAL LOW (ref 8.9–10.3)
Chloride: 111 mmol/L (ref 98–111)
Creatinine, Ser: 1.08 mg/dL (ref 0.61–1.24)
GFR, Estimated: >60 mL/min (ref >60)
Glucose, Bld: 112 mg/dL — ABNORMAL HIGH (ref 70–99)
Potassium: 3.8 mmol/L (ref 3.5–5.1)
Sodium: 139 mmol/L (ref 135–145)

## 2024-10-07 LAB — CBC
HCT: 22 % — ABNORMAL LOW (ref 39.0–52.0)
Hemoglobin: 7.3 g/dL — ABNORMAL LOW (ref 13.0–17.0)
MCH: 32.4 pg (ref 26.0–34.0)
MCHC: 33.2 g/dL (ref 30.0–36.0)
MCV: 97.8 fL (ref 80.0–100.0)
Platelets: 24 K/uL — CL (ref 150–400)
RBC: 2.25 MIL/uL — ABNORMAL LOW (ref 4.22–5.81)
RDW: 16 % — ABNORMAL HIGH (ref 11.5–15.5)
WBC: 1 K/uL — CL (ref 4.0–10.5)
nRBC: 0 % (ref 0.0–0.2)

## 2024-10-07 LAB — MAGNESIUM: Magnesium: 1.9 mg/dL (ref 1.7–2.4)

## 2024-10-07 MED ORDER — SACUBITRIL-VALSARTAN 24-26 MG PO TABS
1.0000 | ORAL_TABLET | Freq: Two times a day (BID) | ORAL | Status: DC
  Administered 2024-10-07 – 2024-10-12 (×11): 1 via ORAL
  Filled 2024-10-07 (×12): qty 1

## 2024-10-07 MED ORDER — LATANOPROSTENE BUNOD 0.024 % OP SOLN: 1.0000 [drp] | Freq: Every day | OPHTHALMIC | Status: DC

## 2024-10-07 MED ORDER — METOPROLOL SUCCINATE ER 25 MG PO TB24
25.0000 mg | ORAL_TABLET | Freq: Every day | ORAL | Status: DC
  Administered 2024-10-07 – 2024-10-12 (×5): 25 mg via ORAL
  Filled 2024-10-07 (×6): qty 1

## 2024-10-07 MED ORDER — FILGRASTIM-AAFI 300 MCG/0.5ML IJ SOSY
300.0000 ug | PREFILLED_SYRINGE | Freq: Every day | INTRAMUSCULAR | Status: AC
  Administered 2024-10-07 – 2024-10-09 (×3): 300 ug via SUBCUTANEOUS
  Filled 2024-10-07 (×3): qty 0.5

## 2024-10-07 MED ORDER — DORZOLAMIDE HCL-TIMOLOL MAL 2-0.5 % OP SOLN
1.0000 [drp] | Freq: Two times a day (BID) | OPHTHALMIC | Status: DC
  Administered 2024-10-07 – 2024-10-12 (×11): 1 [drp] via OPHTHALMIC
  Filled 2024-10-07: qty 10

## 2024-10-07 MED ORDER — FLUTICASONE FUROATE-VILANTEROL 100-25 MCG/ACT IN AEPB
1.0000 | INHALATION_SPRAY | Freq: Every morning | RESPIRATORY_TRACT | Status: DC
  Administered 2024-10-08 – 2024-10-12 (×5): 1 via RESPIRATORY_TRACT
  Filled 2024-10-07: qty 28

## 2024-10-07 MED ORDER — BRIMONIDINE TARTRATE 0.2 % OP SOLN
1.0000 [drp] | Freq: Two times a day (BID) | OPHTHALMIC | Status: DC
  Administered 2024-10-07 – 2024-10-12 (×11): 1 [drp] via OPHTHALMIC
  Filled 2024-10-07: qty 5

## 2024-10-07 MED ORDER — FOLIC ACID 1 MG PO TABS: 1.0000 mg | ORAL_TABLET | Freq: Every day | ORAL | Status: DC

## 2024-10-07 MED ORDER — ORAL CARE MOUTH RINSE: 15.0000 mL | OROMUCOSAL | Status: DC | PRN

## 2024-10-07 MED ORDER — CYCLOSPORINE 0.05 % OP EMUL
1.0000 [drp] | Freq: Two times a day (BID) | OPHTHALMIC | Status: DC
  Administered 2024-10-07 – 2024-10-12 (×11): 1 [drp] via OPHTHALMIC
  Filled 2024-10-07 (×12): qty 30

## 2024-10-07 MED ORDER — NETARSUDIL-LATANOPROST 0.02-0.005 % OP SOLN: 1.0000 [drp] | Freq: Every day | OPHTHALMIC | Status: DC

## 2024-10-07 MED ORDER — PANTOPRAZOLE SODIUM 40 MG PO TBEC
40.0000 mg | DELAYED_RELEASE_TABLET | Freq: Every day | ORAL | Status: DC
  Administered 2024-10-07 – 2024-10-12 (×6): 40 mg via ORAL
  Filled 2024-10-07 (×6): qty 1

## 2024-10-07 MED ORDER — NETARSUDIL DIMESYLATE 0.02 % OP SOLN: 1.0000 [drp] | Freq: Every evening | OPHTHALMIC | Status: DC

## 2024-10-07 MED ORDER — ENSURE PLUS HIGH PROTEIN PO LIQD
237.0000 mL | Freq: Two times a day (BID) | ORAL | Status: DC
  Administered 2024-10-08 – 2024-10-12 (×5): 237 mL via ORAL

## 2024-10-07 MED ORDER — ISOSORBIDE MONONITRATE ER 30 MG PO TB24
30.0000 mg | ORAL_TABLET | Freq: Every day | ORAL | Status: DC
  Administered 2024-10-07 – 2024-10-12 (×6): 30 mg via ORAL
  Filled 2024-10-07 (×6): qty 1

## 2024-10-07 NOTE — Consult Note (Signed)
 [facility]  [phone]   HEMATOLOGY/ONCOLOGY IN-PATIENT CONSULTATION NOTE   PATIENT NAME: [name]   MR#: [id number] DOB: 06/09/1941 CSN#: [id number]   DATE OF SERVICE: 10/07/2024  Requesting Physician: [facility]  Patient Care Team: [name], MD as PCP - General (Family Medicine) Croitoru, Jerel, MD as PCP - Electrophysiology (Cardiology) Croitoru, Jerel, MD as PCP - Cardiology (Cardiology) Duke, Jon Garre, PA as Physician Assistant (Cardiology)  REASON FOR CONSULTATION:  Chemotherapy-induced pancytopenia in a patient with stage IV lung adenocarcinoma, currently on systemic chemotherapy with pemetrexed .  ASSESSMENT & PLAN:  Chemotherapy induced pancytopenia: - Last chemotherapy with pemetrexed , cycle #5 on 09/27/2024.  Labs showed evidence of pancytopenia. - We will proceed with Zarxio  300 mcg subcutaneously daily for at least 3 days or longer, until ANC is above 1000. - Continue to monitor CBCD daily and transfuse.  To keep hemoglobin closer to 8 and platelet count above 20,000, unless active bleeding issues.  Stage IV lung adenocarcinoma: - On systemic treatments with pemetrexed , cycle 5 on 09/27/2024. - Currently being managed by Dr. Sherrod in our clinic.  RLE cellulitis: - Continue broad-spectrum antibiotics with vancomycin and cefepime. - Ultrasound was negative for DVT.   Rest of care as per primary team and other specialties.  Dr. Sherrod will continue to follow him starting from tomorrow, 10/08/2024.  Thanks for the opportunity to participate in the care of this patient. Please contact me if there are any questions.  Chinita Patten, MD Medical Oncology and Hematology 10/07/2024 1:27 PM   HISTORY OF PRESENT ILLNESS:  [name] is a 83 y.o. gentleman with history of lung adenocarcinoma, stage IV, currently receiving systemic treatments with pemetrexed , last dose on 09/27/2024, followed by Dr. Sherrod in  our clinic, was admitted to the hospital on 10/06/2024 after he presented with recurrent vomiting, left calf pain, malaise.  Ultrasound was negative for DVT.  He was admitted for further evaluation and management of right leg cellulitis.  He does have pancytopenia from last chemotherapy on 09/27/2024.  ONCOLOGY HISTORY:  Stage IIIC/IV (T3, N3, M0) non-small cell lung cancer, adenocarcinoma. He presented with a left upper lobe spiculated mass in addition to lingular lesion and suspicious mediastinal lymphadenopathy He also has bilateral hypermetabolic axillary lymph nodes which could be related to metastatic disease versus inflammatory as the patient did have vaccines in both of his arms the week prior to his PET scan.  This was diagnosed in August 2024.   Molecular Studies: His PD-L1 expression was 1% and he has no actionable mutations.   PRIOR THERAPY: 1) Concurrent chemoradiation with carboplatin  for an AUC of 2 and paclitaxel  45 mg/m.  First dose expected on 08/15/23.  Status post 5 cycles of first cycle was given with carboplatin  and paclitaxel  and starting from cycle #2 he was on carboplatin  and Abraxane  secondary to hypersensitivity reaction to paclitaxel . 2) Consolidation immunotherapy with Imfinzi  1500 Mg IV every 4 weeks.  First dose November 28, 2023.  Status post 2 cycles.  Discontinued secondary to intolerance and patient is requested   CURRENT THERAPY:  First-line systemic chemotherapy with carboplatin  for AUC of 4, Alimta  400 Mg/M2 and Keytruda  200 Mg IV every 3 weeks. First dose June 20, 2024. Starting cycle #2 Keytruda  will be discontinued secondary to intolerance with significant itching and patient's preference.   He last received pemetrexed , cycle #5 on 09/27/2024.  Oncology History  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)  08/03/2023 Initial Diagnosis  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)   08/15/2023 - 08/15/2023 Chemotherapy   Patient is on  Treatment Plan : LUNG Carboplatin  + Paclitaxel  + XRT q7d     08/23/2023 - 09/26/2023 Chemotherapy   Patient is on Treatment Plan : LUNG Abraxane  (40)  + Carboplatin  (AUC 2) q7d with XRT     12/01/2023 - 12/28/2023 Chemotherapy   Patient is on Treatment Plan : LUNG NSCLC Durvalumab  (1500) q28d     12/28/2023 Cancer Staging   Staging form: Lung, AJCC 8th Edition - Clinical: Stage IIIC (cT3, cN3, cM0) - Signed by Sherrod Sherrod, MD on 12/28/2023   06/25/2024 -  Chemotherapy   Patient is on Treatment Plan : LUNG Carboplatin  (5) + Pemetrexed  (500) + Pembrolizumab  (200) D1 q21d Induction x 4 cycles / Maintenance Pemetrexed  (500) + Pembrolizumab  (200) D1 q21d       INTERVAL HISTORY:  Patient seen and evaluated.  His wife was by the bedside.  Denies any obvious blood loss.  Denies fevers at home.  Does report pain in the right lower extremity and tenderness to touch.  MEDICAL HISTORY Past Medical History:  Diagnosis Date   AICD (automatic cardioverter/defibrillator) present    CAD (coronary artery disease) 80% stenosis diag of the LAD, 30% in OM2 branch of LCX in 2009    a. Nonobstructive CAD by cath 11/2011 with the exception of the pre-existing diagonal branch #2 lesion.   Chronic systolic CHF (congestive heart failure) (HCC)    CKD (chronic kidney disease) stage 3, GFR 30-59 ml/min (HCC)    Colon polyp, hyperplastic    History of kidney stones    History of radiation therapy    Left Lung- 08/17/23-09/29/23- Dr. Lynwood Nasuti   History of stress test 06/01/2012   Normal myocardial perfusion study. compared to the previous study there is no significant change. this is a low risk scan   Hypertension    Legally blind    both eyes   Myocardial infarction [facility]) 11/22/2011   NICM (nonischemic cardiomyopathy) (HCC)    a. Remote hx of dilated NICM with EF ranging 20-45%, including normal EF by echo (55-60%) in 2014.   Peripheral arterial disease    a. 06/2014: ABI right 0.99, left 1.2, LE dopplers  revealing an occluded right posterior tibial. As symptoms were not felt r/t claudication, no further w/u at the time.   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 Requiring discontinuation of BB dose.   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     SURGICAL HISTORY Past Surgical History:  Procedure Laterality Date   BACK SURGERY     BIV UPGRADE N/A 11/08/2019   Procedure: BIV UPGRADE;  Surgeon: Waddell Danelle ORN, MD;  Location: [facility] INVASIVE CV LAB;  Service: Cardiovascular;  Laterality: N/A;   BRONCHIAL BIOPSY  07/05/2023   Procedure: BRONCHIAL BIOPSIES;  Surgeon: Shelah Lamar RAMAN, MD;  Location: [facility] ENDOSCOPY;  Service: Pulmonary;;   BRONCHIAL BRUSHINGS  07/05/2023   Procedure: BRONCHIAL BRUSHINGS;  Surgeon: Shelah Lamar RAMAN, MD;  Location: [facility] ENDOSCOPY;  Service: Pulmonary;;   BRONCHIAL NEEDLE ASPIRATION BIOPSY  07/05/2023   Procedure: BRONCHIAL NEEDLE ASPIRATION BIOPSIES;  Surgeon: Shelah Lamar RAMAN, MD;  Location: [facility] ENDOSCOPY;  Service: Pulmonary;;   BRONCHIAL WASHINGS  07/05/2023   Procedure: BRONCHIAL WASHINGS;  Surgeon: Shelah Lamar RAMAN, MD;  Location: [facility] ENDOSCOPY;  Service:  Pulmonary;;   CARDIAC CATHETERIZATION  11/2011   CARDIAC CATHETERIZATION  11/2011   didn't demonstrate high grade obstructive disease to account for his LV dysfunction.   CATARACT EXTRACTION, BILATERAL  1990's   CYSTOSCOPY     CYSTOSCOPY WITH BIOPSY Bilateral 08/03/2021   Procedure: CYSTOSCOPY WITH BLADDER BIOPSY, BILATERAL RETROGRADE PYELOGRAM;  Surgeon: Carolee Sherwood JONETTA DOUGLAS, MD;  Location: [facility] ORS;  Service: Urology;  Laterality: Bilateral;  REQUESTING 45 MINS   CYSTOSCOPY WITH URETHRAL DILATATION N/A 05/04/2013   Procedure: CYSTOSCOPY WITH URETHRAL DILATATION;  Surgeon: Alm GORMAN Molt, MD;  Location: [facility] NEURO ORS;  Service: Neurosurgery;  Laterality: N/A;  with insertion of foley catheter   EP study  and ablation of VT  7/13   PVC focus mapped to the right coronary cusp of the aorta, limited ablation performed due to proximity of the focus to the right coronary artery   EYE SURGERY     FINE NEEDLE ASPIRATION  07/05/2023   Procedure: FINE NEEDLE ASPIRATION (FNA) LINEAR;  Surgeon: Shelah Lamar GORMAN, MD;  Location: [facility] ENDOSCOPY;  Service: Pulmonary;;   LEFT HEART CATH AND CORONARY ANGIOGRAPHY N/A 11/07/2019   Procedure: LEFT HEART CATH AND CORONARY ANGIOGRAPHY;  Surgeon: Darron Deatrice LABOR, MD;  Location: [facility] CV LAB;  Service: Cardiovascular;  Laterality: N/A;   LEFT HEART CATHETERIZATION WITH CORONARY ANGIOGRAM N/A 11/25/2011   Procedure: LEFT HEART CATHETERIZATION WITH CORONARY ANGIOGRAM;  Surgeon: Alm LELON Clay, MD;  Location: [facility] CATH LAB;  Service: Cardiovascular;  Laterality: N/A;   PACEMAKER IMPLANT N/A 07/12/2018   Procedure: PACEMAKER IMPLANT;  Surgeon: Francyne Headland, MD;  Location: [facility]VAS[facility] CV LAB;  Service: Cardiovascular;  Laterality: N/A;   POSTERIOR FUSION LUMBAR SPINE  1979   ROTATOR CUFF REPAIR  2000's   left   V-TACH ABLATION N/A 06/06/2012   Procedure: V-TACH ABLATION;  Surgeon: Lynwood Rakers, MD;  Location: [facility] CATH LAB;  Service: Cardiovascular;  Laterality: N/A;   VIDEO BRONCHOSCOPY WITH ENDOBRONCHIAL ULTRASOUND N/A 07/05/2023   Procedure: VIDEO BRONCHOSCOPY WITH ENDOBRONCHIAL ULTRASOUND;  Surgeon: Shelah Lamar GORMAN, MD;  Location: [facility] ENDOSCOPY;  Service: Pulmonary;  Laterality: N/A;     ALLERGIES  Allergies  Allergen Reactions   Paclitaxel  Other (See Comments) and Hypertension    Pt reported chest pain, back pain, and SOB during first infusion (see notes from 9/23). Infusion discontinued.    Allegra [Fexofenadine] Other (See Comments)    Makes him dizzy and feels like he will fall.   Sonafine [Wound Dressings] Itching    Itching and burning to skin  08/25/23    FAMILY HISTORY  Family History  Problem Relation Age of Onset   Asthma Mother    Other Other         Unsure if any heart disease in his family.     SOCIAL HISTORY   Social History   Socioeconomic History   Marital status: Legally Separated    Spouse name: Not on file   Number of children: 3   Years of education: Not on file   Highest education level: Not on file  Occupational History   Occupation: Retired    Associate Professor: RETIRED  Tobacco Use   Smoking status: Former    Current packs/day: 0.00    Types: Cigarettes    Start date: 11/22/1949    Quit date: 11/23/1999    Years since quitting: 24.8    Passive exposure: Current   Smokeless tobacco: Never  Vaping Use   Vaping status: Never Used  Substance and Sexual Activity   Alcohol use: No    Comment: 05/26/12 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; stopped drinking in the early 1980's   Drug use: No   Sexual activity: Yes  Other Topics Concern   Not on file  Social History Narrative   Not on file   Social Drivers of Health   Financial Resource Strain: Not on file  Food Insecurity: No Food Insecurity (10/06/2024)   Hunger Vital Sign    Worried About Running Out of Food in the Last Year: Never true    Ran Out of Food in the Last Year: Never true  Transportation Needs: No Transportation Needs (10/06/2024)   PRAPARE - Administrator, Civil Service (Medical): No    Lack of Transportation (Non-Medical): No  Physical Activity: Not on file  Stress: Not on file  Social Connections: Moderately Integrated (10/06/2024)   Social Connection and Isolation Panel    Frequency of Communication with Friends and Family: More than three times a week    Frequency of Social Gatherings with Friends and Family: Once a week    Attends Religious Services: More than 4 times per year    Active Member of [employer] or Organizations: No    Attends Banker Meetings: Never    Marital Status: Married  Catering Manager Violence: Not At Risk (10/06/2024)   Humiliation, Afraid, Rape, and Kick questionnaire    Fear of Current or Ex-Partner: No     Emotionally Abused: No    Physically Abused: No    Sexually Abused: No    CURRENT MEDICATIONS   Current Outpatient Medications  Medication Instructions   apixaban  (ELIQUIS ) 2.5 mg, Oral, 2 times daily   BREO ELLIPTA  100-25 MCG/INH AEPB 1 puff, Inhalation, Every morning   brimonidine  (ALPHAGAN ) 0.2 % ophthalmic solution 1 drop, Both Eyes, 2 times daily   Continuous Glucose Sensor (FREESTYLE LIBRE 3 PLUS SENSOR) MISC SMARTSIG:Topical Every 3 Months   cycloSPORINE  (RESTASIS ) 0.05 % ophthalmic emulsion 1 drop, Both Eyes, 2 times daily   dorzolamide -timolol  (COSOPT ) 22.3-6.8 MG/ML ophthalmic solution 1 drop, Both Eyes, 2 times daily   doxycycline  (VIBRAMYCIN ) 100 mg, Oral, 2 times daily   ENTRESTO  24-26 MG 1 tablet, Oral, 2 times daily   folic acid  (FOLVITE ) 1 mg, Oral, Daily, Start 7 days before pemetrexed  chemotherapy. Continue until 21 days after pemetrexed  completed.   furosemide  (LASIX ) 10 mg, Oral, Daily   Glucagon  Emergency 1 mg, Subcutaneous, As needed   hydrocortisone  1 % lotion 1 Application, Topical, 2 times daily   insulin  isophane & regular human KwikPen (HUMULIN 70/30 MIX) (70-30) 100 UNIT/ML KwikPen 25 Units, Subcutaneous, 2 times daily   isosorbide  mononitrate (IMDUR ) 30 mg, Oral, Daily   lidocaine  (LIDODERM ) 5 % 1 patch, Transdermal, Every 24 hours, Remove & Discard patch within 12 hours or as directed by MD   metoprolol  succinate (TOPROL -XL) 25 mg, Oral, Daily   omeprazole  (PRILOSEC) 20 mg, Oral, Daily   ondansetron  (ZOFRAN ) 8 mg, Oral, Every 8 hours PRN, Start on the third day after carboplatin .   RHOPRESSA 0.02 % SOLN 1 drop, Ophthalmic, Every evening   ROCKLATAN  0.02-0.005 % SOLN 1 drop, Both Eyes, Daily at bedtime   spironolactone  (ALDACTONE ) 12.5 mg, Oral, Daily   VYZULTA 0.024 % SOLN 1 drop, Ophthalmic, Daily at bedtime     REVIEW OF SYSTEMS   Review of Systems - Oncology  All other pertinent review of systems is negative except as mentioned above in  HPI  PHYSICAL EXAMINATION  ECOG PERFORMANCE STATUS: 3 - Symptomatic, >50% confined to bed  Vitals:   10/07/24 0059 10/07/24 0524  BP: (!) 109/54 120/62  Pulse: 78 79  Resp: 18 18  Temp: 99.3 F (37.4 C) 98.6 F (37 C)  SpO2: 100% 100%   Filed Weights   10/06/24 1200  Weight: 149 lb (67.6 kg)    Physical Exam Constitutional:      Appearance: Normal appearance.  HENT:     Head: Normocephalic and atraumatic.  Eyes:     Conjunctiva/sclera: Conjunctivae normal.  Cardiovascular:     Rate and Rhythm: Normal rate and regular rhythm.  Pulmonary:     Effort: Pulmonary effort is normal. No respiratory distress.  Abdominal:     General: There is no distension.  Musculoskeletal:     Comments: Right lower extremity tenderness/swelling noted.  Neurological:     General: No focal deficit present.     Mental Status: He is alert and oriented to person, place, and time.  Psychiatric:        Mood and Affect: Mood normal.        Behavior: Behavior normal.     LABORATORY DATA:   I have reviewed the data as listed  Results for orders placed or performed during the hospital encounter of 10/06/24 (from the past 24 hours)  Resp panel by RT-PCR (RSV, Flu A&B, Covid) Anterior Nasal Swab   Collection Time: 10/06/24  1:42 PM   Specimen: Anterior Nasal Swab  Result Value Ref Range   SARS Coronavirus 2 by RT PCR NEGATIVE NEGATIVE   Influenza A by PCR NEGATIVE NEGATIVE   Influenza B by PCR NEGATIVE NEGATIVE   Resp Syncytial Virus by PCR NEGATIVE NEGATIVE  Comprehensive metabolic panel   Collection Time: 10/06/24  2:32 PM  Result Value Ref Range   Sodium 140 135 - 145 mmol/L   Potassium 3.9 3.5 - 5.1 mmol/L   Chloride 109 98 - 111 mmol/L   CO2 20 (L) 22 - 32 mmol/L   Glucose, Bld 120 (H) 70 - 99 mg/dL   BUN 22 8 - 23 mg/dL   Creatinine, Ser 8.82 0.61 - 1.24 mg/dL   Calcium  8.8 (L) 8.9 - 10.3 mg/dL   Total Protein 6.5 6.5 - 8.1 g/dL   Albumin  3.2 (L) 3.5 - 5.0 g/dL   AST 19 15 -  41 U/L   ALT 8 0 - 44 U/L   Alkaline Phosphatase 43 38 - 126 U/L   Total Bilirubin 0.8 0.0 - 1.2 mg/dL   GFR, Estimated >39 >39 mL/min   Anion gap 10 5 - 15  Lipase, blood   Collection Time: 10/06/24  2:32 PM  Result Value Ref Range   Lipase 11 11 - 51 U/L  Troponin T, High Sensitivity   Collection Time: 10/06/24  2:32 PM  Result Value Ref Range   Troponin T High Sensitivity 21 (H) 0 - 19 ng/L  Troponin T, High Sensitivity   Collection Time: 10/06/24  2:39 PM  Result Value Ref Range   Troponin T High Sensitivity 21 (H) 0 - 19 ng/L  CBC with Differential   Collection Time: 10/06/24  3:43 PM  Result Value Ref Range   WBC 1.3 (LL) 4.0 - 10.5 K/uL   RBC 2.28 (L) 4.22 - 5.81 MIL/uL   Hemoglobin 7.5 (L) 13.0 - 17.0 g/dL   HCT 77.4 (L) 60.9 - 47.9 %   MCV 98.7 80.0 - 100.0 fL   MCH 32.9 26.0 -  34.0 pg   MCHC 33.3 30.0 - 36.0 g/dL   RDW 83.8 (H) 88.4 - 84.4 %   Platelets 26 (LL) 150 - 400 K/uL   nRBC 0.0 0.0 - 0.2 %   Neutrophils Relative % 71 %   Neutro Abs 0.9 (L) 1.7 - 7.7 K/uL   Lymphocytes Relative 21 %   Lymphs Abs 0.3 (L) 0.7 - 4.0 K/uL   Monocytes Relative 5 %   Monocytes Absolute 0.1 0.1 - 1.0 K/uL   Eosinophils Relative 2 %   Eosinophils Absolute 0.0 0.0 - 0.5 K/uL   Basophils Relative 0 %   Basophils Absolute 0.0 0.0 - 0.1 K/uL   Immature Granulocytes 1 %   Abs Immature Granulocytes 0.01 0.00 - 0.07 K/uL   Ovalocytes PRESENT   Culture, blood (routine x 2)   Collection Time: 10/06/24  6:31 PM   Specimen: BLOOD RIGHT FOREARM  Result Value Ref Range   Specimen Description      BLOOD RIGHT FOREARM Performed at [facility], [address], [place], [place] [address]    Special Requests      BOTTLES DRAWN AEROBIC AND ANAEROBIC Blood Culture results may not be optimal due to an inadequate volume of blood received in culture bottles Performed at [facility], [address], [place], [place] [address]    Culture      NO GROWTH < 12  HOURS Performed at [facility], [address], [place], [place] [address]    Report Status PENDING   Culture, blood (routine x 2)   Collection Time: 10/06/24  8:36 PM   Specimen: BLOOD LEFT HAND  Result Value Ref Range   Specimen Description BLOOD LEFT HAND    Special Requests      BOTTLES DRAWN AEROBIC ONLY Blood Culture adequate volume   Culture      NO GROWTH < 12 HOURS Performed at [facility], [address] [place], [place] [address]    Report Status PENDING   Magnesium    Collection Time: 10/07/24  6:23 AM  Result Value Ref Range   Magnesium  1.9 1.7 - 2.4 mg/dL  Basic metabolic panel   Collection Time: 10/07/24  6:23 AM  Result Value Ref Range   Sodium 139 135 - 145 mmol/L   Potassium 3.8 3.5 - 5.1 mmol/L   Chloride 111 98 - 111 mmol/L   CO2 20 (L) 22 - 32 mmol/L   Glucose, Bld 112 (H) 70 - 99 mg/dL   BUN 22 8 - 23 mg/dL   Creatinine, Ser 8.91 0.61 - 1.24 mg/dL   Calcium  8.8 (L) 8.9 - 10.3 mg/dL   GFR, Estimated >39 >39 mL/min   Anion gap 8 5 - 15  CBC   Collection Time: 10/07/24  6:23 AM  Result Value Ref Range   WBC 1.0 (LL) 4.0 - 10.5 K/uL   RBC 2.25 (L) 4.22 - 5.81 MIL/uL   Hemoglobin 7.3 (L) 13.0 - 17.0 g/dL   HCT 77.9 (L) 60.9 - 47.9 %   MCV 97.8 80.0 - 100.0 fL   MCH 32.4 26.0 - 34.0 pg   MCHC 33.2 30.0 - 36.0 g/dL   RDW 83.9 (H) 88.4 - 84.4 %   Platelets 24 (LL) 150 - 400 K/uL   nRBC 0.0 0.0 - 0.2 %  Type and screen [facility]   Collection Time: 10/07/24  6:23 AM  Result Value Ref Range   ABO/RH(D) B POS  Antibody Screen NEG    Sample Expiration      10/10/2024,2359 Performed at [facility], [address] [place], [place] [address]       RADIOGRAPHIC STUDIES:  I have personally reviewed the radiological images as listed and agree with the findings in the report.  VAS US  LOWER EXTREMITY VENOUS (DVT) (ONLY [facility]) Result Date: 10/07/2024  Lower Venous DVT Study Patient Name:   [name]  Date of Exam:   10/06/2024 Medical Rec #: [id number]         Accession #:    [id number] Date of Birth: Oct 30, 1941          Patient Gender: M Patient Age:   45 years Exam Location:  [facility] Procedure:      VAS US  LOWER EXTREMITY VENOUS (DVT) Referring Phys: MATTHEW TRIFAN --------------------------------------------------------------------------------  Indications: Pain, and Darkened erythema along lateral lower leg.  Risk Factors: Cancer: Primary adenocarcinoma of upper lobe of left lung, stage IIIC/IV, on chemotherapy Paroxysmal atrial fibrillation, ICD. Limitations: Pain with compression maneuvers in calf. Comparison Study: Prior negative right LEV done 02/26/16 Performing Technologist: Alberta Lis RVS  Examination Guidelines: A complete evaluation includes B-mode imaging, spectral Doppler, color Doppler, and power Doppler as needed of all accessible portions of each vessel. Bilateral testing is considered an integral part of a complete examination. Limited examinations for reoccurring indications may be performed as noted. The reflux portion of the exam is performed with the patient in reverse Trendelenburg.  +---------+---------------+---------+-----------+----------+---------------+ RIGHT    CompressibilityPhasicitySpontaneityPropertiesThrombus Aging  +---------+---------------+---------+-----------+----------+---------------+ CFV      Full           Yes      Yes                                  +---------+---------------+---------+-----------+----------+---------------+ SFJ      Full                                                         +---------+---------------+---------+-----------+----------+---------------+ FV Prox  Full           Yes      Yes                                  +---------+---------------+---------+-----------+----------+---------------+ FV Mid   Full                                                          +---------+---------------+---------+-----------+----------+---------------+ FV DistalFull                                                         +---------+---------------+---------+-----------+----------+---------------+ PFV      Full           Yes      Yes                                  +---------+---------------+---------+-----------+----------+---------------+  POP      Full           Yes      Yes                                  +---------+---------------+---------+-----------+----------+---------------+ PTV                                                   patent by color +---------+---------------+---------+-----------+----------+---------------+ PERO                                                  patent by color +---------+---------------+---------+-----------+----------+---------------+   +----+---------------+---------+-----------+----------+--------------+ LEFTCompressibilityPhasicitySpontaneityPropertiesThrombus Aging +----+---------------+---------+-----------+----------+--------------+ CFV Full           Yes      Yes                                 +----+---------------+---------+-----------+----------+--------------+ SFJ Full                                                        +----+---------------+---------+-----------+----------+--------------+     Summary: RIGHT: - There is no evidence of deep vein thrombosis in the lower extremity.  - Ultrasound characteristics of enlarged lymph nodes are noted in the groin.  LEFT: - No evidence of common femoral vein obstruction.  - Ultrasound characteristics of enlarged lymph nodes noted in the groin.  *See table(s) above for measurements and observations. Electronically signed by Penne Colorado MD on 10/07/2024 at 10:44:45 AM.    Final    DG Chest 2 View Result Date: 10/06/2024 EXAM: 2 VIEW(S) XRAY OF THE CHEST 10/06/2024 02:13:00 PM COMPARISON: 07/12/2024 CLINICAL HISTORY: chest pain FINDINGS:  LINES, TUBES AND DEVICES: Stable left subclavian AICD. LUNGS AND PLEURA: Persistent lingular focal masslike consolidation. No pleural effusion. No pneumothorax. HEART AND MEDIASTINUM: AP window is obscured, suggesting mediastinal adenopathy as before. No acute abnormality of the cardiac silhouette. BONES AND SOFT TISSUES: Thoracolumbar fixation hardware partially visualized without acute finding. No acute osseous abnormality. IMPRESSION: 1. Persistent lingular focal masslike consolidation, unchanged from prior study. 2. AP window is obscured suggesting mediastinal adenopathy, unchanged from prior study. Electronically signed by: Dayne Hassell MD 10/06/2024 02:20 PM EST RP Workstation: [id number]     This document was completed utilizing speech recognition software. Grammatical errors, random word insertions, pronoun errors, and incomplete sentences are an occasional consequence of this system due to software limitations, ambient noise, and hardware issues. Any formal questions or concerns about the content, text or information contained within the body of this dictation should be directly addressed to the provider for clarification.

## 2024-10-07 NOTE — Progress Notes (Signed)
 PROGRESS NOTE [name]  FMW:[id number] DOB: [birth year] DOA: 10/06/2024 PCP: [name], MD  Brief Narrative/Hospital Course: [name] is a 83 y.o. male with PMH of A-fib on Eliquis , AICD in place, CAD chronic systolic CHF CKD stage III, history of renal stones, hypertension, legally blind, peripheral artery disease, stage IIIc/IV NSCLC-adenocarcinoma, currently on reduced dose of chemotherapy S/P 4 cycle carboplatin , Alimta , and Keytruda  which has been discontinued due to intolerance, previous pancytopenia needing G-CSF presented with increasing fatigue, pain above his pacemaker in the right leg. In the ED noted to have darker erythema along his lateral lower leg, DVT was negative given his immunocompromise status admitted for cellulitis Oncology was consulted  Subjective: Seen and examined today Complains of pain in his right lower extremity and tender to touch appears darkened No cough congestion chest pain.  Reports some nausea this morning but did not throw up Overnight on room air afebrile, VSS, Labs with pancytopenia --WBC further down 1 platelet at 24 hemoglobin 7.3  Assessment and plan:  Right leg cellulitis Immunocompromise status: DVT negative and ultrasound.  Still having pain in the right lower extremity, skin appears dark -, area is tender  continue broad-spectrum antibiotic vancomycin and cefepime.  Elevate the leg, continue pain control. Blood culture in process   Pancytopenia due to chemotherapy: Discussed with oncology Dr Conchetta planning to start filgastrim 300 mcg daily for neutropenia if he did not receive before. Goal to keep platelets >20k w/ transfusion  PRN, Recent Labs  Lab 10/06/24 1543 10/07/24 0623  HGB 7.5* 7.3*  HCT 22.5* 22.0*  WBC 1.3* 1.0*  PLT 26* 24*    PAF: Patient on Eliquis , holding due to severe thrombocytopenia, anemia.   Chronic kidney disease, stage 3b: Renal function remains stable Recent Labs    08/16/24 1050  08/22/24 1500 08/29/24 1509 08/31/24 0919 09/06/24 0804 09/12/24 1520 09/19/24 1516 09/27/24 1048 10/06/24 1432 10/07/24 0623  BUN 16 25* 16 15 17  27* 18 12 22 22   CREATININE 1.35* 1.46* 1.17 1.10 1.11 1.23 1.31* 1.13 1.17 1.08  CO2 28 25 25 26 26 23 24 26  20* 20*  K 4.7 3.9 3.9 3.9 4.0 4.0 3.8 4.4 3.9 3.8   Constipation: Bowel regimen to be continued  CAD/NICM Chronic systolic CHF ICD in place: EF 64-59% from echo 04/30/2023 with G1 DD. BP fairly stable.  Continue patient's Entresto , Toprol  Imdur  as BP allows- hold Aldactone , Lasix   PVD: Monitor  Primary adenocarcinoma of upper lobe of left lung: Patient currently on chemotherapy oncology consulted.  Patient reports receiving chemotherapy last Friday.  DVT prophylaxis: Place and maintain sequential compression device Start: 10/07/24 1150 no chemical prophylaxis due to thrombocytopenia Code Status:   Code Status: Full Code Family Communication: plan of care discussed with patient at bedside. Patient status is: Remains hospitalized because of severity of illness Level of care: Med-Surg   Dispo: The patient is from: Home with wife, ambulatory at baseline            Anticipated disposition: TBD Objective: Vitals last 24 hrs: Vitals:   10/06/24 2006 10/06/24 2206 10/07/24 0059 10/07/24 0524  BP: (!) 162/86 129/72 (!) 109/54 120/62  Pulse: 74 80 78 79  Resp: 18 18 18 18   Temp: 100.3 F (37.9 C) 99.1 F (37.3 C) 99.3 F (37.4 C) 98.6 F (37 C)  TempSrc: Oral Oral Oral Oral  SpO2: 100% 97% 100% 100%  Weight:      Height:        Physical  Examination: General exam: alert awake, oriented, older than stated age HEENT:Oral mucosa moist, Ear/Nose WNL grossly Respiratory system: Bilaterally clear BS,no use of accessory muscle Cardiovascular system: S1 & S2 +, No JVD. Gastrointestinal system: Abdomen soft,NT,ND, BS+ Nervous System: Alert, awake, moving all extremities,and following commands. Extremities: extremities  warm, leg edema on RLE w/ tenderness. Skin: Warm, no rashes MSK: Normal muscle bulk,tone, power   Medications reviewed:  Scheduled Meds:  brimonidine   1 drop Both Eyes BID   cycloSPORINE   1 drop Both Eyes BID   dorzolamide -timolol   1 drop Both Eyes BID   feeding supplement  237 mL Oral BID BM   fluticasone  furoate-vilanterol  1 puff Inhalation q morning   isosorbide  mononitrate  30 mg Oral Daily   Latanoprostene Bunod  1 drop Ophthalmic QHS   metoprolol  succinate  25 mg Oral Daily   Netarsudil -Latanoprost   1 drop Both Eyes QHS   pantoprazole   40 mg Oral Daily   sacubitril -valsartan   1 tablet Oral BID   Continuous Infusions:  ceFEPime (MAXIPIME) IV 2 g (10/07/24 9364)   vancomycin     Diet: Diet Order             Diet regular Room service appropriate? Yes; Fluid consistency: Thin  Diet effective now                    Data Reviewed: I have personally reviewed following labs and imaging studies ( see epic result tab) CBC: Recent Labs  Lab 10/06/24 1543 10/07/24 0623  WBC 1.3* 1.0*  NEUTROABS 0.9*  --   HGB 7.5* 7.3*  HCT 22.5* 22.0*  MCV 98.7 97.8  PLT 26* 24*   CMP: Recent Labs  Lab 10/06/24 1432 10/07/24 0623  NA 140 139  K 3.9 3.8  CL 109 111  CO2 20* 20*  GLUCOSE 120* 112*  BUN 22 22  CREATININE 1.17 1.08  CALCIUM  8.8* 8.8*  MG  --  1.9   GFR: Estimated Creatinine Clearance: 46.8 mL/min (by C-G formula based on SCr of 1.08 mg/dL). Recent Labs  Lab 10/06/24 1432  AST 19  ALT 8  ALKPHOS 43  BILITOT 0.8  PROT 6.5  ALBUMIN  3.2*    Recent Labs  Lab 10/06/24 1432  LIPASE 11   No results for input(s): AMMONIA in the last 168 hours. Coagulation Profile: No results for input(s): INR, PROTIME in the last 168 hours. Unresulted Labs (From admission, onward)     Start     Ordered   10/08/24 0500  CBC with Differential/Platelet  Daily,   R      10/07/24 1151   10/08/24 0500  Basic metabolic panel with GFR  Daily,   R      10/07/24 1151            Antimicrobials/Microbiology: Anti-infectives (From admission, onward)    Start     Dose/Rate Route Frequency Ordered Stop   10/07/24 1900  vancomycin (VANCOCIN) IVPB 1000 mg/200 mL premix        1,000 mg 200 mL/hr over 60 Minutes Intravenous Every 24 hours 10/06/24 1904     10/06/24 1900  ceFEPIme (MAXIPIME) 2 g in sodium chloride  0.9 % 100 mL IVPB        2 g 200 mL/hr over 30 Minutes Intravenous Every 12 hours 10/06/24 1832     10/06/24 1745  vancomycin (VANCOCIN) IVPB 1000 mg/200 mL premix  Status:  Discontinued        1,000 mg 200  mL/hr over 60 Minutes Intravenous  Once 10/06/24 1730 10/06/24 1732   10/06/24 1745  ceFEPIme (MAXIPIME) 2 g in sodium chloride  0.9 % 100 mL IVPB  Status:  Discontinued        2 g 200 mL/hr over 30 Minutes Intravenous  Once 10/06/24 1730 10/06/24 1832   10/06/24 1745  vancomycin (VANCOREADY) IVPB 1500 mg/300 mL        1,500 mg 150 mL/hr over 120 Minutes Intravenous  Once 10/06/24 1732 10/06/24 2130         Component Value Date/Time   SDES BLOOD LEFT HAND 10/06/2024 2036   SPECREQUEST  10/06/2024 2036    BOTTLES DRAWN AEROBIC ONLY Blood Culture adequate volume   CULT  10/06/2024 2036    NO GROWTH < 12 HOURS Performed at [facility], [address], [place], [place] [address]    REPTSTATUS PENDING 10/06/2024 2036    Procedures:    Mennie LAMY, MD [facility] 10/07/2024, 11:51 AM

## 2024-10-07 NOTE — Progress Notes (Signed)
 PHARMACY - PHYSICIAN COMMUNICATION CRITICAL VALUE ALERT - BLOOD CULTURE IDENTIFICATION (BCID)  [name] is an 83 y.o. male who presented to [facility] on 10/06/2024 with a chief complaint of vomiting and persistent left calf pain.  Assessment:   Bcx 1/3 bottles staph species - both epi and lugdunensis detected, no methicillin resistance reported. Pt on cefepime and vancomycin for cellulitis Pancytopenia s/t chemo ~ 10 days PTA  Name of physician (or Provider) Contacted: Lynwood Kipper, NP  Current antibiotics: vancomycin, cefepime  Changes to prescribed antibiotics recommended:  -Consider de-escalation to cefazolin  alone -Defer to day team (Onc + TR[facility]) for further antibiotic de-escalation  Results for orders placed or performed during the hospital encounter of 10/06/24  Blood Culture ID Panel (Reflexed) (Collected: 10/06/2024  6:31 PM)  Result Value Ref Range   Enterococcus faecalis NOT DETECTED NOT DETECTED   Enterococcus Faecium NOT DETECTED NOT DETECTED   Listeria monocytogenes NOT DETECTED NOT DETECTED   Staphylococcus species DETECTED (A) NOT DETECTED   Staphylococcus aureus (BCID) NOT DETECTED NOT DETECTED   Staphylococcus epidermidis DETECTED (A) NOT DETECTED   Staphylococcus lugdunensis DETECTED (A) NOT DETECTED   Streptococcus species NOT DETECTED NOT DETECTED   Streptococcus agalactiae NOT DETECTED NOT DETECTED   Streptococcus pneumoniae NOT DETECTED NOT DETECTED   Streptococcus pyogenes NOT DETECTED NOT DETECTED   A.calcoaceticus-baumannii NOT DETECTED NOT DETECTED   Bacteroides fragilis NOT DETECTED NOT DETECTED   Enterobacterales NOT DETECTED NOT DETECTED   Enterobacter cloacae complex NOT DETECTED NOT DETECTED   Escherichia coli NOT DETECTED NOT DETECTED   Klebsiella aerogenes NOT DETECTED NOT DETECTED   Klebsiella oxytoca NOT DETECTED NOT DETECTED   Klebsiella pneumoniae NOT DETECTED NOT DETECTED   Proteus species NOT DETECTED NOT DETECTED   Salmonella  species NOT DETECTED NOT DETECTED   Serratia marcescens NOT DETECTED NOT DETECTED   Haemophilus influenzae NOT DETECTED NOT DETECTED   Neisseria meningitidis NOT DETECTED NOT DETECTED   Pseudomonas aeruginosa NOT DETECTED NOT DETECTED   Stenotrophomonas maltophilia NOT DETECTED NOT DETECTED   Candida albicans NOT DETECTED NOT DETECTED   Candida auris NOT DETECTED NOT DETECTED   Candida glabrata NOT DETECTED NOT DETECTED   Candida krusei NOT DETECTED NOT DETECTED   Candida parapsilosis NOT DETECTED NOT DETECTED   Candida tropicalis NOT DETECTED NOT DETECTED   Cryptococcus neoformans/gattii NOT DETECTED NOT DETECTED   Methicillin resistance mecA/C NOT DETECTED NOT DETECTED    Stefano MARLA Bologna, PharmD, BCPS Clinical Pharmacist 10/07/2024 7:49 PM

## 2024-10-07 NOTE — Hospital Course (Addendum)
 [name] is a 83 y.o. male with PMH of A-fib on Eliquis , AICD in place, CAD chronic systolic CHF CKD stage III, history of renal stones, hypertension, legally blind, peripheral artery disease, stage IIIc/IV NSCLC-adenocarcinoma, currently on reduced dose of chemotherapy S/P 4 cycle carboplatin , Alimta , and Keytruda  which has been discontinued due to intolerance, previous pancytopenia needing G-CSF presented with increasing fatigue, pain above his pacemaker in the right leg. In the ZI:wnuzi to have darker erythema along his lateral lower leg, DVT was negative given his immunocompromise status admitted with right lower extremity cellulitis, DVT study negative, bacteremia with Staph Luggdunesis ID Ep consulted given PM TTE ok,s/p TEE no vegetation Plan for PICC line and 6 wk of ABX  Subjective: Seen and examined Waiting for PICC Overnight afebrile BP stable on room air Labs this morning with mild hypokalemia 3.1 hemoglobin improved to 8.2 WBC 6.3 platelet 45  Discharge Diagnoses:   Right leg cellulitis Bacteremia w/ Staph lugudnesis Immunocompromise status: Admitted with right lower extremity cellulitis, DVT study negative, bacteremia with Staph Luggdunesis ID following repeat blood culture 11/17 NGTD Given pacemaker status EP cardiology s/p TEE 11/20-no vegetation EP has evaluated patient and at this point plan for 6 weeks of IV Ancef  EOT 12/29 ID will arrange outpatient follow-up   Pancytopenia due to chemotherapy: S/p filgastrim and transfusion of platelet and PRBC.  Labs trending up.  Follow-up outpatient  Eliquis  has been on hold and discussed w/ [facility] ND Dr Gatha who will resume outpatient after eval next week due to thrombocytopenia  PAF: Rate controlled.Eliquis  remains on hold due to severe thrombocytopenia and anemia -no need instruction from oncology to resume  CKD 3b Mild metabolic acidosis Mild hypokalemia: Renal function remains stable. Replacing  k.  Constipation: Continue bowel regimen  CAD/NICM Chronic systolic CHF ICD in place: EF 64-59% from echo 04/30/2023 with G1 DD. BP fairly stable. Continue patient's Entresto , Toprol  Imdur  and rest of meds on d/c Given bacteremia EP has been made aware TTE-no obvious vegetation but cannot completely exclude EF 45-50% G1 DD Tee done  PVD: Monitor  NSCLC stage IIIc/IV, ACC: Initially diagnosed August 2024 received chemoradiation with carboplatin  and paclitaxel  last received consolidation immunotherapy Imfinzi . on systemic treatment with pemetrexed  + pembrolizumab .  Status post cycle 5 on 09/27/2024.   [name] is arranging outpatient follow-up  DVT prophylaxis: Place and maintain sequential compression device Start: 10/07/24 1150 no chemical prophylaxis due to thrombocytopenia Code Status:   Code Status: Full Code Family Communication: plan of care discussed with patient at bedside.  Wife updated Patient status is: Remains hospitalized because of severity of illness Level of care: Telemetry   Dispo: The patient is from: Home with wife, ambulatory at baseline            Anticipated disposition: home w/ picc line today or tomorrow Objective: Vitals last 24 hrs: Vitals:   10/11/24 2046 10/12/24 0511 10/12/24 0840 10/12/24 1311  BP: 138/75 116/71  103/67  Pulse: 76 100  73  Resp: 18 18  16   Temp: 98.8 F (37.1 C) 98.6 F (37 C)  98.4 F (36.9 C)  TempSrc: Oral Oral  Oral  SpO2: 100% 100% 95% 100%  Weight:      Height:        Physical Examination: General exam: AAOX3 HEENT:Oral mucosa moist, Ear/Nose WNL grossly Respiratory system: CTA Bilaterally Cardiovascular system: S1 & S2 +, No JVD. Gastrointestinal system: Abdomen soft,NT,ND, BS+ Nervous System: Alert, awake, moving all extremities,and following commands. Extremities: extremities warm,  right lower extremity mildly tender Skin: Warm, no rashes MSK: Normal muscle bulk,tone, power

## 2024-10-08 DIAGNOSIS — D701 Agranulocytosis secondary to cancer chemotherapy: Secondary | ICD-10-CM | POA: Diagnosis not present

## 2024-10-08 DIAGNOSIS — L03115 Cellulitis of right lower limb: Secondary | ICD-10-CM | POA: Diagnosis not present

## 2024-10-08 DIAGNOSIS — B957 Other staphylococcus as the cause of diseases classified elsewhere: Secondary | ICD-10-CM | POA: Diagnosis not present

## 2024-10-08 DIAGNOSIS — C3412 Malignant neoplasm of upper lobe, left bronchus or lung: Secondary | ICD-10-CM | POA: Diagnosis not present

## 2024-10-08 DIAGNOSIS — C3490 Malignant neoplasm of unspecified part of unspecified bronchus or lung: Secondary | ICD-10-CM

## 2024-10-08 DIAGNOSIS — D61818 Other pancytopenia: Secondary | ICD-10-CM | POA: Diagnosis not present

## 2024-10-08 DIAGNOSIS — Z95810 Presence of automatic (implantable) cardiac defibrillator: Secondary | ICD-10-CM

## 2024-10-08 DIAGNOSIS — R7881 Bacteremia: Secondary | ICD-10-CM | POA: Diagnosis not present

## 2024-10-08 LAB — CBC WITH DIFFERENTIAL/PLATELET
Abs Immature Granulocytes: 0.15 K/uL — ABNORMAL HIGH (ref 0.00–0.07)
Basophils Absolute: 0 K/uL (ref 0.0–0.1)
Basophils Relative: 0 %
Eosinophils Absolute: 0 K/uL (ref 0.0–0.5)
Eosinophils Relative: 2 %
HCT: 23.6 % — ABNORMAL LOW (ref 39.0–52.0)
Hemoglobin: 7.5 g/dL — ABNORMAL LOW (ref 13.0–17.0)
Immature Granulocytes: 12 %
Lymphocytes Relative: 27 %
Lymphs Abs: 0.3 K/uL — ABNORMAL LOW (ref 0.7–4.0)
MCH: 31.8 pg (ref 26.0–34.0)
MCHC: 31.8 g/dL (ref 30.0–36.0)
MCV: 100 fL (ref 80.0–100.0)
Monocytes Absolute: 0.1 K/uL (ref 0.1–1.0)
Monocytes Relative: 9 %
Neutro Abs: 0.6 K/uL — ABNORMAL LOW (ref 1.7–7.7)
Neutrophils Relative %: 50 %
Platelets: 16 K/uL — CL (ref 150–400)
RBC: 2.36 MIL/uL — ABNORMAL LOW (ref 4.22–5.81)
RDW: 15.9 % — ABNORMAL HIGH (ref 11.5–15.5)
Smear Review: NORMAL
WBC: 1.3 K/uL — CL (ref 4.0–10.5)
nRBC: 0 % (ref 0.0–0.2)

## 2024-10-08 LAB — BASIC METABOLIC PANEL WITH GFR
Anion gap: 10 (ref 5–15)
BUN: 16 mg/dL (ref 8–23)
CO2: 20 mmol/L — ABNORMAL LOW (ref 22–32)
Calcium: 8.7 mg/dL — ABNORMAL LOW (ref 8.9–10.3)
Chloride: 112 mmol/L — ABNORMAL HIGH (ref 98–111)
Creatinine, Ser: 1.05 mg/dL (ref 0.61–1.24)
GFR, Estimated: >60 mL/min (ref >60)
Glucose, Bld: 110 mg/dL — ABNORMAL HIGH (ref 70–99)
Potassium: 4.1 mmol/L (ref 3.5–5.1)
Sodium: 142 mmol/L (ref 135–145)

## 2024-10-08 MED ORDER — SODIUM CHLORIDE 0.9% IV SOLUTION: Freq: Once | INTRAVENOUS | Status: AC

## 2024-10-08 MED ORDER — SODIUM CHLORIDE 0.9 % IV BOLUS
1000.0000 mL | Freq: Once | INTRAVENOUS | Status: AC
  Administered 2024-10-08: 1000 mL via INTRAVENOUS

## 2024-10-08 MED ORDER — CEFAZOLIN SODIUM-DEXTROSE 2-4 GM/100ML-% IV SOLN
2.0000 g | Freq: Three times a day (TID) | INTRAVENOUS | Status: DC
  Administered 2024-10-08 – 2024-10-11 (×10): 2 g via INTRAVENOUS
  Filled 2024-10-08 (×10): qty 100

## 2024-10-08 NOTE — Plan of Care (Signed)

## 2024-10-08 NOTE — Progress Notes (Addendum)
 PROGRESS NOTE [name]  FMW:[id number] DOB: June 04, 1941 DOA: 10/06/2024 PCP: [name], MD  Brief Narrative/Hospital Course: [name] is a 83 y.o. male with PMH of A-fib on Eliquis , AICD in place, CAD chronic systolic CHF CKD stage III, history of renal stones, hypertension, legally blind, peripheral artery disease, stage IIIc/IV NSCLC-adenocarcinoma, currently on reduced dose of chemotherapy S/P 4 cycle carboplatin , Alimta , and Keytruda  which has been discontinued due to intolerance, previous pancytopenia needing G-CSF presented with increasing fatigue, pain above his pacemaker in the right leg. In the ED noted to have darker erythema along his lateral lower leg, DVT was negative given his immunocompromise status admitted for cellulitis Oncology was consulted  Subjective: Seen and examined Complains of some headache, otherwise no new complaints Denies fever chills.  Right lower extremity is less painful Overnight hemodynamically stable on room air afebrile Labs reviewed stable renal function CBC with leukopenia trending up, plt 24>16k, hb stable in 7.5g  Assessment and plan:  Right leg cellulitis Bacteremia Immunocompromise status: DVT negative in ultrasound. having pain in the right lower extremity, skin appears dark STAPHYLOCOCCUS LUGDUNENSIS  in BCID will C/S ID. C/s pending. Blood culture from admission NGTD. Patient is leukopenic and immunocompromised. Continue broad-spectrum antibiotic vancomycin and cefepime  Pancytopenia due to chemotherapy: Dr Clementine oncology following  started on filgastrim 300 mcg daily: Goal to keep platelets >20k w/ transfusion  PRN Per Dr. Sherrod okay to transfuse 1 unit platelet which I will order-patient is agreeable Recent Labs  Lab 10/06/24 1543 10/07/24 0623 10/08/24 0704  HGB 7.5* 7.3* 7.5*  HCT 22.5* 22.0* 23.6*  WBC 1.3* 1.0* 1.3*  PLT 26* 24* 16*    PAF: Rate controlled.  Eliquis  remains on hold due to severe  thrombocytopenia and anemia   Chronic kidney disease, stage 3b Mild metabolic acidosis: Renal function remains stable, monitor.  Encourage p.o. Recent Labs    08/22/24 1500 08/29/24 1509 08/31/24 0919 09/06/24 0804 09/12/24 1520 09/19/24 1516 09/27/24 1048 10/06/24 1432 10/07/24 0623 10/08/24 0704  BUN 25* 16 15 17  27* 18 12 22 22 16   CREATININE 1.46* 1.17 1.10 1.11 1.23 1.31* 1.13 1.17 1.08 1.05  CO2 25 25 26 26 23 24 26  20* 20* 20*  K 3.9 3.9 3.9 4.0 4.0 3.8 4.4 3.9 3.8 4.1   Constipation: Continue bowel regimen  CAD/NICM Chronic systolic CHF ICD in place: EF 64-59% from echo 04/30/2023 with G1 DD. BP fairly stable.Continue patient's Entresto , Toprol  Imdur  as BP allows- hold Aldactone , Lasix  for now.  PVD: Monitor  Primary adenocarcinoma of upper lobe of left lung: Patient currently on chemotherapy oncology consulted.  Patient reports receiving chemotherapy last Friday.  DVT prophylaxis: Place and maintain sequential compression device Start: 10/07/24 1150 no chemical prophylaxis due to thrombocytopenia Code Status:   Code Status: Full Code Family Communication: plan of care discussed with patient at bedside. Patient status is: Remains hospitalized because of severity of illness Level of care: Med-Surg   Dispo: The patient is from: Home with wife, ambulatory at baseline            Anticipated disposition: TBD Objective: Vitals last 24 hrs: Vitals:   10/07/24 2036 10/08/24 0433 10/08/24 0810 10/08/24 0900  BP: 119/65 (!) 101/59 118/63   Pulse: 78 75 76   Resp: 18 18 16    Temp: 99.2 F (37.3 C) 99.2 F (37.3 C) 99.4 F (37.4 C)   TempSrc: Oral Oral Oral   SpO2: 98% 99% 100% 96%  Weight:  Height:        Physical Examination: General exam: AAO HEENT:Oral mucosa moist, Ear/Nose WNL grossly Respiratory system: Bilaterally clear BS,no use of accessory muscle Cardiovascular system: S1 & S2 +, No JVD. Gastrointestinal system: Abdomen soft,NT,ND,  BS+ Nervous System: Alert, awake, moving all extremities,and following commands. Extremities: extremities warm, minimal tenderness in the right lower extremity Skin: Warm, no rashes MSK: Normal muscle bulk,tone, power   Medications reviewed:  Scheduled Meds:  sodium chloride    Intravenous Once   brimonidine   1 drop Both Eyes BID   cycloSPORINE   1 drop Both Eyes BID   dorzolamide -timolol   1 drop Both Eyes BID   feeding supplement  237 mL Oral BID BM   filgrastim  (NIVESTYM ) SQ  300 mcg Subcutaneous Q1200   fluticasone  furoate-vilanterol  1 puff Inhalation q morning   isosorbide  mononitrate  30 mg Oral Daily   Latanoprostene Bunod  1 drop Ophthalmic QHS   metoprolol  succinate  25 mg Oral Daily   Netarsudil -Latanoprost   1 drop Both Eyes QHS   pantoprazole   40 mg Oral Daily   sacubitril -valsartan   1 tablet Oral BID   Continuous Infusions:  ceFEPime (MAXIPIME) IV 2 g (10/08/24 0642)   vancomycin 1,000 mg (10/07/24 1947)   Diet: Diet Order             Diet regular Room service appropriate? Yes; Fluid consistency: Thin  Diet effective now                    Data Reviewed: I have personally reviewed following labs and imaging studies ( see epic result tab) CBC: Recent Labs  Lab 10/06/24 1543 10/07/24 0623 10/08/24 0704  WBC 1.3* 1.0* 1.3*  NEUTROABS 0.9*  --  0.6*  HGB 7.5* 7.3* 7.5*  HCT 22.5* 22.0* 23.6*  MCV 98.7 97.8 100.0  PLT 26* 24* 16*   CMP: Recent Labs  Lab 10/06/24 1432 10/07/24 0623 10/08/24 0704  NA 140 139 142  K 3.9 3.8 4.1  CL 109 111 112*  CO2 20* 20* 20*  GLUCOSE 120* 112* 110*  BUN 22 22 16   CREATININE 1.17 1.08 1.05  CALCIUM  8.8* 8.8* 8.7*  MG  --  1.9  --    GFR: Estimated Creatinine Clearance: 48.1 mL/min (by C-G formula based on SCr of 1.05 mg/dL). Recent Labs  Lab 10/06/24 1432  AST 19  ALT 8  ALKPHOS 43  BILITOT 0.8  PROT 6.5  ALBUMIN  3.2*    Recent Labs  Lab 10/06/24 1432  LIPASE 11   No results for input(s): AMMONIA  in the last 168 hours. Coagulation Profile: No results for input(s): INR, PROTIME in the last 168 hours. Unresulted Labs (From admission, onward)     Start     Ordered   10/08/24 1153  Prepare platelet pheresis  (Blood Administration Adult)  Once,   R       Question Answer Comment  Number of Apheresis Units (1 unit of apheresis platelets will increase platelets 30,000/mL in an avg sized adult) 1 unit   Transfusion Indications Bleeding due to thrombocytopenia or platelet fcn anormality   Date/Time blood product needed For transfusion     Placed in And Linked Group   10/08/24 1153   10/08/24 0500  CBC with Differential/Platelet  Daily,   R      10/07/24 1151   10/08/24 0500  Basic metabolic panel with GFR  Daily,   R      10/07/24 1151  Antimicrobials/Microbiology: Anti-infectives (From admission, onward)    Start     Dose/Rate Route Frequency Ordered Stop   10/07/24 1900  vancomycin (VANCOCIN) IVPB 1000 mg/200 mL premix        1,000 mg 200 mL/hr over 60 Minutes Intravenous Every 24 hours 10/06/24 1904     10/06/24 1900  ceFEPIme (MAXIPIME) 2 g in sodium chloride  0.9 % 100 mL IVPB        2 g 200 mL/hr over 30 Minutes Intravenous Every 12 hours 10/06/24 1832     10/06/24 1745  vancomycin (VANCOCIN) IVPB 1000 mg/200 mL premix  Status:  Discontinued        1,000 mg 200 mL/hr over 60 Minutes Intravenous  Once 10/06/24 1730 10/06/24 1732   10/06/24 1745  ceFEPIme (MAXIPIME) 2 g in sodium chloride  0.9 % 100 mL IVPB  Status:  Discontinued        2 g 200 mL/hr over 30 Minutes Intravenous  Once 10/06/24 1730 10/06/24 1832   10/06/24 1745  vancomycin (VANCOREADY) IVPB 1500 mg/300 mL        1,500 mg 150 mL/hr over 120 Minutes Intravenous  Once 10/06/24 1732 10/06/24 2130         Component Value Date/Time   SDES BLOOD LEFT HAND 10/06/2024 2036   SPECREQUEST  10/06/2024 2036    BOTTLES DRAWN AEROBIC ONLY Blood Culture adequate volume   CULT  10/06/2024 2036    NO  GROWTH 2 DAYS Performed at [facility], [address] [place], [place] [address]    REPTSTATUS PENDING 10/06/2024 2036    Procedures:    Mennie LAMY, MD [facility] 10/08/2024, 11:59 AM

## 2024-10-08 NOTE — Progress Notes (Signed)
 [name]   DOB:1941-03-07   FM#:[id number]      ASSESSMENT & PLAN:  [name]. [name] is an 83 year old male patient admitted on 10/06/2024 with complaints of recurrent vomiting and right lower extremity  pain.  Oncologic history is significant for non-small cell lung cancer.   Medical oncology following.   Non-small cell lung cancer, stage IIIc/IV, adenocarcinoma - Diagnosed August 2024 - Received prior chemo radiation with carbo and paclitaxel .  Later received consolidation immunotherapy with Imfinzi . - On systemic treatment with pemetrexed  + pembrolizumab .  Status post cycle 5 on 09/27/2024. - Medical oncology/Dr. Gatha following  Pancytopenia - Likely secondary to recent chemotherapy - Continue to monitor CBC with differential Leukopenia -WBC low 1.3 with ANC 0.6 today. - Continue G-CSF until ANC greater than 1500 - Monitor fever curve Anemia - Hemoglobin 7.5 -Transfuse PRBC for hemoglobin <7.0 Thrombocytopenia -Platelets 16K -Recommend platelet transfusion for counts <20 K or <50K with active bleeding  Vomiting Right lower extremity cellulitis - Please give antiemetics as needed - Ultrasound negative for DVT - Continue supportive care   Code Status Full   Subjective:  Patient seen awake and alert sitting up in bed.  Family member at bedside.  IV fluids infusing well.  Reports that he came in because he was feeling sick on my stomach.  Now feels much better.  Denies acute complaints.  Objective:   Intake/Output Summary (Last 24 hours) at 10/08/2024 1054 Last data filed at 10/08/2024 0953 Gross per 24 hour  Intake 539.11 ml  Output 675 ml  Net -135.89 ml     PHYSICAL EXAMINATION: ECOG PERFORMANCE STATUS: 2 - Symptomatic, <50% confined to bed  Vitals:   10/08/24 0810 10/08/24 0900  BP: 118/63   Pulse: 76   Resp: 16   Temp: 99.4 F (37.4 C)   SpO2: 100% 96%   Filed Weights   10/06/24 1200  Weight: 149 lb (67.6 kg)    GENERAL: alert, +  elderly appearing + NAD SKIN: + RLE cellulitis with pain with tactile stimuli EYES: normal, conjunctiva are pink and non-injected, sclera clear OROPHARYNX: no exudate, no erythema and lips, buccal mucosa, and tongue normal  NECK: supple, thyroid  normal size, non-tender, without nodularity LYMPH: no palpable lymphadenopathy in the cervical, axillary or inguinal LUNGS: clear to auscultation and percussion with normal breathing effort HEART: regular rate & rhythm and no murmurs and no lower extremity edema ABDOMEN: abdomen soft, non-tender and normal bowel sounds MUSCULOSKELETAL: no cyanosis of digits and no clubbing  PSYCH: alert & oriented x 3 with fluent speech NEURO: no focal motor/sensory deficits   All questions were answered. The patient knows to call the clinic with any problems, questions or concerns.   The total time spent in the appointment was 40 minutes encounter with patient including review of chart and various tests results, discussions about plan of care and coordination of care plan  Olam JINNY Brunner, NP 10/08/2024 10:54 AM    Labs Reviewed:  Lab Results  Component Value Date   WBC 1.3 (LL) 10/08/2024   HGB 7.5 (L) 10/08/2024   HCT 23.6 (L) 10/08/2024   MCV 100.0 10/08/2024   PLT 16 (LL) 10/08/2024   Recent Labs    09/19/24 1516 09/27/24 1048 10/06/24 1432 10/07/24 0623 10/08/24 0704  NA 135 138 140 139 142  K 3.8 4.4 3.9 3.8 4.1  CL 105 107 109 111 112*  CO2 24 26 20* 20* 20*  GLUCOSE 95 99 120* 112* 110*  BUN 18 12 22 22 16   CREATININE 1.31* 1.13 1.17 1.08 1.05  CALCIUM  8.9 9.0 8.8* 8.8* 8.7*  GFRNONAA 53* >60 >60 >60 >60  PROT 7.1 7.0 6.5  --   --   ALBUMIN  3.5 3.4* 3.2*  --   --   AST 17 17 19   --   --   ALT 8 8 8   --   --   ALKPHOS 50 53 43  --   --   BILITOT 0.5 0.5 0.8  --   --     Studies Reviewed:  VAS US  LOWER EXTREMITY VENOUS (DVT) (ONLY [facility] & [facility]) Result Date: 10/07/2024  Lower Venous DVT Study Patient Name:  [name]  Date of  Exam:   10/06/2024 Medical Rec #: [id number]         Accession #:    [id number] Date of Birth: [birth year]          Patient Gender: M Patient Age:   71 years Exam Location:  [facility] Procedure:      VAS US  LOWER EXTREMITY VENOUS (DVT) Referring Phys: MATTHEW TRIFAN --------------------------------------------------------------------------------  Indications: Pain, and Darkened erythema along lateral lower leg.  Risk Factors: Cancer: Primary adenocarcinoma of upper lobe of left lung, stage IIIC/IV, on chemotherapy Paroxysmal atrial fibrillation, ICD. Limitations: Pain with compression maneuvers in calf. Comparison Study: Prior negative right LEV done 02/26/16 Performing Technologist: Alberta Lis RVS  Examination Guidelines: A complete evaluation includes B-mode imaging, spectral Doppler, color Doppler, and power Doppler as needed of all accessible portions of each vessel. Bilateral testing is considered an integral part of a complete examination. Limited examinations for reoccurring indications may be performed as noted. The reflux portion of the exam is performed with the patient in reverse Trendelenburg.  +---------+---------------+---------+-----------+----------+---------------+ RIGHT    CompressibilityPhasicitySpontaneityPropertiesThrombus Aging  +---------+---------------+---------+-----------+----------+---------------+ CFV      Full           Yes      Yes                                  +---------+---------------+---------+-----------+----------+---------------+ SFJ      Full                                                         +---------+---------------+---------+-----------+----------+---------------+ FV Prox  Full           Yes      Yes                                  +---------+---------------+---------+-----------+----------+---------------+ FV Mid   Full                                                          +---------+---------------+---------+-----------+----------+---------------+ FV DistalFull                                                         +---------+---------------+---------+-----------+----------+---------------+  PFV      Full           Yes      Yes                                  +---------+---------------+---------+-----------+----------+---------------+ POP      Full           Yes      Yes                                  +---------+---------------+---------+-----------+----------+---------------+ PTV                                                   patent by color +---------+---------------+---------+-----------+----------+---------------+ PERO                                                  patent by color +---------+---------------+---------+-----------+----------+---------------+   +----+---------------+---------+-----------+----------+--------------+ LEFTCompressibilityPhasicitySpontaneityPropertiesThrombus Aging +----+---------------+---------+-----------+----------+--------------+ CFV Full           Yes      Yes                                 +----+---------------+---------+-----------+----------+--------------+ SFJ Full                                                        +----+---------------+---------+-----------+----------+--------------+     Summary: RIGHT: - There is no evidence of deep vein thrombosis in the lower extremity.  - Ultrasound characteristics of enlarged lymph nodes are noted in the groin.  LEFT: - No evidence of common femoral vein obstruction.  - Ultrasound characteristics of enlarged lymph nodes noted in the groin.  *See table(s) above for measurements and observations. Electronically signed by Penne Colorado MD on 10/07/2024 at 10:44:45 AM.    Final    DG Chest 2 View Result Date: 10/06/2024 EXAM: 2 VIEW(S) XRAY OF THE CHEST 10/06/2024 02:13:00 PM COMPARISON: 07/12/2024 CLINICAL HISTORY: chest pain FINDINGS:  LINES, TUBES AND DEVICES: Stable left subclavian AICD. LUNGS AND PLEURA: Persistent lingular focal masslike consolidation. No pleural effusion. No pneumothorax. HEART AND MEDIASTINUM: AP window is obscured, suggesting mediastinal adenopathy as before. No acute abnormality of the cardiac silhouette. BONES AND SOFT TISSUES: Thoracolumbar fixation hardware partially visualized without acute finding. No acute osseous abnormality. IMPRESSION: 1. Persistent lingular focal masslike consolidation, unchanged from prior study. 2. AP window is obscured suggesting mediastinal adenopathy, unchanged from prior study. Electronically signed by: Dayne Hassell MD 10/06/2024 02:20 PM EST RP Workstation: [id number]

## 2024-10-09 ENCOUNTER — Inpatient Hospital Stay (HOSPITAL_COMMUNITY)

## 2024-10-09 DIAGNOSIS — I38 Endocarditis, valve unspecified: Secondary | ICD-10-CM

## 2024-10-09 DIAGNOSIS — L03115 Cellulitis of right lower limb: Secondary | ICD-10-CM | POA: Diagnosis not present

## 2024-10-09 LAB — BPAM PLATELET PHERESIS
Blood Product Expiration Date: 202511172359
ISSUE DATE / TIME: 202511171346
Unit Type and Rh: 6200

## 2024-10-09 LAB — ECHOCARDIOGRAM COMPLETE
Area-P 1/2: 4.46 cm2
Calc EF: 47.2 %
MV VTI: 2.04 cm2
S' Lateral: 3.6 cm
Single Plane A2C EF: 47.4 %
Single Plane A4C EF: 45.3 %

## 2024-10-09 LAB — CBC WITH DIFFERENTIAL/PLATELET
Abs Immature Granulocytes: 0.2 K/uL — ABNORMAL HIGH (ref 0.00–0.07)
Basophils Absolute: 0 K/uL (ref 0.0–0.1)
Basophils Relative: 0 %
Eosinophils Absolute: 0 K/uL (ref 0.0–0.5)
Eosinophils Relative: 1 %
HCT: 20.6 % — ABNORMAL LOW (ref 39.0–52.0)
Hemoglobin: 6.8 g/dL — CL (ref 13.0–17.0)
Lymphocytes Relative: 18 %
Lymphs Abs: 0.3 K/uL — ABNORMAL LOW (ref 0.7–4.0)
MCH: 32.2 pg (ref 26.0–34.0)
MCHC: 33 g/dL (ref 30.0–36.0)
MCV: 97.6 fL (ref 80.0–100.0)
Metamyelocytes Relative: 6 %
Monocytes Absolute: 0.1 K/uL (ref 0.1–1.0)
Monocytes Relative: 6 %
Myelocytes: 2 %
Neutro Abs: 1.3 K/uL — ABNORMAL LOW (ref 1.7–7.7)
Neutrophils Relative %: 67 %
Platelets: 22 K/uL — CL (ref 150–400)
RBC: 2.11 MIL/uL — ABNORMAL LOW (ref 4.22–5.81)
RDW: 16.1 % — ABNORMAL HIGH (ref 11.5–15.5)
Smear Review: NORMAL
WBC: 1.9 K/uL — ABNORMAL LOW (ref 4.0–10.5)
nRBC: 0 % (ref 0.0–0.2)

## 2024-10-09 LAB — PREPARE PLATELET PHERESIS: Unit division: 0

## 2024-10-09 LAB — BASIC METABOLIC PANEL WITH GFR
Anion gap: 9 (ref 5–15)
BUN: 16 mg/dL (ref 8–23)
CO2: 20 mmol/L — ABNORMAL LOW (ref 22–32)
Calcium: 8.8 mg/dL — ABNORMAL LOW (ref 8.9–10.3)
Chloride: 111 mmol/L (ref 98–111)
Creatinine, Ser: 1.15 mg/dL (ref 0.61–1.24)
GFR, Estimated: >60 mL/min (ref >60)
Glucose, Bld: 119 mg/dL — ABNORMAL HIGH (ref 70–99)
Potassium: 3.5 mmol/L (ref 3.5–5.1)
Sodium: 141 mmol/L (ref 135–145)

## 2024-10-09 LAB — PREPARE RBC (CROSSMATCH)

## 2024-10-09 LAB — CULTURE, BLOOD (ROUTINE X 2)

## 2024-10-09 MED ORDER — ACETAMINOPHEN 325 MG PO TABS: 650.0000 mg | ORAL_TABLET | Freq: Once | ORAL | Status: DC

## 2024-10-09 MED ORDER — SODIUM CHLORIDE 0.9% IV SOLUTION: Freq: Once | INTRAVENOUS | Status: AC

## 2024-10-09 NOTE — Consult Note (Signed)
 [facility]    Date of Admission:  10/06/2024   Total days of inpatient antibiotics 1        Reason for Consult: Staph lug bacteremia    Principal Problem:   Cellulitis Active Problems:   Chronic kidney disease, stage 3b (HCC)   Constipation   Paroxysmal atrial fibrillation (HCC)   ICD (implantable cardioverter-defibrillator) in place   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)   Cellulitis of right leg   Pancytopenia (HCC)   Assessment: 83 year old male with history of stage IV non-small cell lung cancer-adenocarcinoma on reduced chemotherapy, pancytopenia, AICD, A-fib on Eliquis , CKD stage III, CAD with chronic heart failure, hypertension, legally blind, PAD presented with right leg pain and fatigue admitted with right lower leg cellulitis.  Patient was placed on vancomycin and cefepime found to have #Staph lugdunensis bacteremia likely secondary to #2 in the setting of ICD #ICD #Right lower extremities cellulitis #Pancytopenia - Patient stated ICC site is itchy as times.  No erythema noted. - Blood cultures grew 1 out of 4 staph lugdunensis, BC ID also showed staph epi.  Suspect staph epi 1 out of 4 bottles is contaminant -Oncology on board given chemotherapy induced pancytopenia patient on Zarxio  #Stage IV lung cancer - Patient on on systemic treatments with pemetrexed , cycle 5 on 09/27/2024.  Followed by Dr. Sherrod  Recommendations:  -Stop vancomycin and cefepime -start cefazolin  -Repeat blood Cx to ensure clearance -Pt has AICD> engage EP. Ordered tte, will need TEE. Communicated to primary -Protective precautions  Microbiology:   Antibiotics: 11/15- vancomycin  Cefepime 11/15-  Cultures: Blood 11/15 1/4 bottles staph lugdeneinsis Urine  Other   HPI: [name] is a 83 y.o. male with past medical history of Stage IV non-small cell cancer, A-fib on Eliquis , AICD presented to the ED due to recurrent  vomiting and persistent left calf pain.  Last chemotherapy was about 2 weeks ago.  On arrival patient initially febrile but then developed temp 100.3 WBC 1.3 K.  Admitted for right leg cellulitis.  Dopplers negative for DVT.  ID engaged as blood cultures grew staph lugdunensis.   Review of Systems: Review of Systems  All other systems reviewed and are negative.   Past Medical History:  Diagnosis Date   AICD (automatic cardioverter/defibrillator) present    CAD (coronary artery disease) 80% stenosis diag of the LAD, 30% in OM2 branch of LCX in 2009    a. Nonobstructive CAD by cath 11/2011 with the exception of the pre-existing diagonal branch #2 lesion.   Chronic systolic CHF (congestive heart failure) (HCC)    CKD (chronic kidney disease) stage 3, GFR 30-59 ml/min (HCC)    Colon polyp, hyperplastic    History of kidney stones    History of radiation therapy    Left Lung- 08/17/23-09/29/23- Dr. Lynwood Nasuti   History of stress test 06/01/2012   Normal myocardial perfusion study. compared to the previous study there is no significant change. this is a low risk scan   Hypertension    Legally blind    both eyes   Myocardial infarction [facility]) 11/22/2011   NICM (nonischemic cardiomyopathy) (HCC)    a. Remote hx of dilated NICM with EF ranging 20-45%, including normal EF by echo (55-60%) in 2014.   Peripheral arterial disease    a. 06/2014: ABI right 0.99, left 1.2, LE dopplers revealing an occluded right posterior tibial. As symptoms were not felt r/t  claudication, no further w/u at the time.   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 Requiring discontinuation of BB dose.   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   Scheduled Meds:  brimonidine   1 drop Both Eyes BID   cycloSPORINE   1 drop Both Eyes BID   dorzolamide -timolol   1 drop Both Eyes BID   feeding supplement  237 mL Oral BID BM   filgrastim  (NIVESTYM ) SQ  300 mcg Subcutaneous Q1200   fluticasone  furoate-vilanterol  1 puff Inhalation q morning   isosorbide  mononitrate  30 mg Oral Daily   Latanoprostene Bunod  1 drop Ophthalmic QHS   metoprolol  succinate  25 mg Oral Daily   Netarsudil -Latanoprost   1 drop Both Eyes QHS   pantoprazole   40 mg Oral Daily   sacubitril -valsartan   1 tablet Oral BID   Continuous Infusions:   ceFAZolin  (ANCEF ) IV 2 g (10/08/24 2158)   PRN Meds:.acetaminophen  **OR** acetaminophen , bisacodyl , ondansetron  **OR** ondansetron  (ZOFRAN ) IV, mouth rinse Allergies  Allergen Reactions   Paclitaxel  Other (See Comments) and Hypertension    Pt reported chest pain, back pain, and SOB during first infusion (see notes from 9/23). Infusion discontinued.    Allegra [Fexofenadine] Other (See Comments)    Makes him dizzy and feels like he will fall.   Sonafine [Wound Dressings] Itching    Itching and burning to skin  08/25/23    OBJECTIVE: Blood pressure (!) 101/58, pulse 99, temperature 98.7 F (37.1 C), temperature source Oral, resp. rate 16, height 5' 7 (1.702 m), weight 67.6 kg, SpO2 100%.  Physical Exam Constitutional:      General: He is not in acute distress.    Appearance: He is normal weight. He is not toxic-appearing.  HENT:     Head: Normocephalic and atraumatic.     Right Ear: External ear normal.      Left Ear: External ear normal.     Nose: No congestion or rhinorrhea.     Mouth/Throat:     Mouth: Mucous membranes are moist.     Pharynx: Oropharynx is clear.  Eyes:     Extraocular Movements: Extraocular movements intact.     Conjunctiva/sclera: Conjunctivae normal.     Pupils: Pupils are equal, round, and reactive to light.  Cardiovascular:     Rate and Rhythm: Normal rate and regular rhythm.     Heart sounds: No murmur heard.    No friction rub. No gallop.     Comments: Left sided AICD with central scab. Pulmonary:     Effort: Pulmonary effort is normal.     Breath sounds: Normal breath sounds.  Abdominal:     General: Abdomen is flat. Bowel sounds are normal.     Palpations: Abdomen is soft.  Musculoskeletal:     Cervical back: Normal range of motion and neck supple.  Skin:    General: Skin is warm and dry.  Neurological:     General: No focal deficit present.     Mental Status: He is oriented to person, place, and time.  Psychiatric:        Mood and Affect: Mood normal.     Lab Results Lab Results  Component Value Date   WBC 1.3 (LL) 10/08/2024   HGB 7.5 (L) 10/08/2024   HCT 23.6 (L) 10/08/2024   MCV 100.0 10/08/2024   PLT 16 (LL) 10/08/2024    Lab Results  Component Value Date   CREATININE 1.05 10/08/2024   BUN 16 10/08/2024   NA 142 10/08/2024   K 4.1 10/08/2024   CL 112 (H) 10/08/2024   CO2 20 (L) 10/08/2024    Lab Results  Component Value Date   ALT 8 10/06/2024   AST 19 10/06/2024   ALKPHOS 43 10/06/2024   BILITOT 0.8 10/06/2024       Loney Stank, MD [facility] 10/09/2024, 5:18 AM Evaluation of this patient requires complex antimicrobial therapy evaluation and counseling + isolation needs for disease transmission risk assessment and mitigation

## 2024-10-09 NOTE — Progress Notes (Signed)
  Echocardiogram 2D Echocardiogram has been performed.  [name] 10/09/2024, 8:25 AM

## 2024-10-09 NOTE — Progress Notes (Signed)
 PROGRESS NOTE ALEZANDER DIMAANO  FMW:[id number] DOB: 10-04-1941 DOA: 10/06/2024 PCP: [name], MD  Brief Narrative/Hospital Course: [name] is a 83 y.o. male with PMH of A-fib on Eliquis , AICD in place, CAD chronic systolic CHF CKD stage III, history of renal stones, hypertension, legally blind, peripheral artery disease, stage IIIc/IV NSCLC-adenocarcinoma, currently on reduced dose of chemotherapy S/P 4 cycle carboplatin , Alimta , and Keytruda  which has been discontinued due to intolerance, previous pancytopenia needing G-CSF presented with increasing fatigue, pain above his pacemaker in the right leg. In the ED noted to have darker erythema along his lateral lower leg, DVT was negative given his immunocompromise status admitted for cellulitis Oncology was consulted  Subjective: Seen and examined Patient reports he feels better no new complaints Overnight afebrile BP stable  Repeat labs show hemoglobin down 6.8 platelet at 22K  Assessment and plan:  Right leg cellulitis Bacteremia w/ Staph lugudnesis Immunocompromise status: DVT negative in ultrasound. having pain in the right lower extremity, skin appears dark STAPHYLOCOCCUS LUGDUNENSIS  in BCID sensitivity pending, ID has been consulted continue Ancef , repeat blood culture ordered 11/17 to ensure clearance>Given pacemaker status EP cardiology has been made aware follow-up echocardiogram and await recommendation from ID and EP-?  Transfer to [facility].  Pancytopenia due to chemotherapy: Dr Clementine oncology following  started on filgastrim 300 mcg daily: Goal to keep platelets >20k w/ transfusion  PRN Received 1 unit platelet 11/17, will transfuse 1 unit PRBC this morning -keep hemoglobin above 7.  His last blood transfusion was 2 weeks ago. Recent Labs  Lab 10/07/24 0623 10/08/24 0704 10/09/24 0635  HGB 7.3* 7.5* 6.8*  HCT 22.0* 23.6* 20.6*  WBC 1.0* 1.3* 1.9*  PLT 24* 16* 22*    PAF: Rate controlled.Eliquis   remains on hold due to severe thrombocytopenia and anemia   CKD 3b Mild metabolic acidosis: Renal function remains stable, continue to monitor BUN and creatinine and bicarbonate.Encourage p.o. Recent Labs    08/29/24 1509 08/31/24 0919 09/06/24 0804 09/12/24 1520 09/19/24 1516 09/27/24 1048 10/06/24 1432 10/07/24 0623 10/08/24 0704 10/09/24 0635  BUN 16 15 17  27* 18 12 22 22 16 16   CREATININE 1.17 1.10 1.11 1.23 1.31* 1.13 1.17 1.08 1.05 1.15  CO2 25 26 26 23 24 26  20* 20* 20* 20*  K 3.9 3.9 4.0 4.0 3.8 4.4 3.9 3.8 4.1 3.5   Constipation: Continue bowel regimen  CAD/NICM Chronic systolic CHF ICD in place: EF 64-59% from echo 04/30/2023 with G1 DD. BP fairly stable.Continue patient's Entresto , Toprol  Imdur  as BP allows- hold Aldactone , Lasix  for now.  Given bacteremia EP has been made aware Fu TTE   PVD: Monitor  NSCLC stage IIIc/IV, ACC: Initially diagnosed August 2024 received chemoradiation with carboplatin  and paclitaxel  last received consolidation immunotherapy Imfinzi . on systemic treatment with pemetrexed  + pembrolizumab .  Status post cycle 5 on 09/27/2024 Oncology following closely-now with bacteremia  DVT prophylaxis: Place and maintain sequential compression device Start: 10/07/24 1150 no chemical prophylaxis due to thrombocytopenia Code Status:   Code Status: Full Code Family Communication: plan of care discussed with patient at bedside.  Wife updated at the bedside 11/17 Patient status is: Remains hospitalized because of severity of illness Level of care: Med-Surg   Dispo: The patient is from: Home with wife, ambulatory at baseline            Anticipated disposition: TBD Objective: Vitals last 24 hrs: Vitals:   10/09/24 0452 10/09/24 1000 10/09/24 1012 10/09/24 1026  BP: (!) 101/58 ROLLEN)  104/51 (!) 104/51 (!) 127/59  Pulse: 99 74 74 73  Resp: 16 18 20 20   Temp: 98.7 F (37.1 C) 98.1 F (36.7 C) 98.1 F (36.7 C) 98.1 F (36.7 C)  TempSrc: Oral Oral  Oral Oral  SpO2: 100% 100% 99% 100%  Weight:      Height:        Physical Examination: General exam: AAOX3, pleasant HEENT:Oral mucosa moist, Ear/Nose WNL grossly Respiratory system: Bilaterally clear BS,no use of accessory muscle Cardiovascular system: S1 & S2 +, No JVD. Gastrointestinal system: Abdomen soft,NT,ND, BS+ Nervous System: Alert, awake, moving all extremities,and following commands. Extremities: extremities warm, MILD tenderness in the right lower extremity Skin: Warm, no rashes MSK: Normal muscle bulk,tone, power   Medications reviewed:  Scheduled Meds:  acetaminophen   650 mg Oral Once   brimonidine   1 drop Both Eyes BID   cycloSPORINE   1 drop Both Eyes BID   dorzolamide -timolol   1 drop Both Eyes BID   feeding supplement  237 mL Oral BID BM   filgrastim  (NIVESTYM ) SQ  300 mcg Subcutaneous Q1200   fluticasone  furoate-vilanterol  1 puff Inhalation q morning   isosorbide  mononitrate  30 mg Oral Daily   Latanoprostene Bunod  1 drop Ophthalmic QHS   metoprolol  succinate  25 mg Oral Daily   Netarsudil -Latanoprost   1 drop Both Eyes QHS   pantoprazole   40 mg Oral Daily   sacubitril -valsartan   1 tablet Oral BID   Continuous Infusions:   ceFAZolin  (ANCEF ) IV 2 g (10/09/24 0546)   Diet: Diet Order             Diet regular Room service appropriate? Yes; Fluid consistency: Thin  Diet effective now                    Data Reviewed: I have personally reviewed following labs and imaging studies ( see epic result tab) CBC: Recent Labs  Lab 10/06/24 1543 10/07/24 0623 10/08/24 0704 10/09/24 0635  WBC 1.3* 1.0* 1.3* 1.9*  NEUTROABS 0.9*  --  0.6* 1.3*  HGB 7.5* 7.3* 7.5* 6.8*  HCT 22.5* 22.0* 23.6* 20.6*  MCV 98.7 97.8 100.0 97.6  PLT 26* 24* 16* 22*   CMP: Recent Labs  Lab 10/06/24 1432 10/07/24 0623 10/08/24 0704 10/09/24 0635  NA 140 139 142 141  K 3.9 3.8 4.1 3.5  CL 109 111 112* 111  CO2 20* 20* 20* 20*  GLUCOSE 120* 112* 110* 119*  BUN 22 22  16 16   CREATININE 1.17 1.08 1.05 1.15  CALCIUM  8.8* 8.8* 8.7* 8.8*  MG  --  1.9  --   --    GFR: Estimated Creatinine Clearance: 43.9 mL/min (by C-G formula based on SCr of 1.15 mg/dL). Recent Labs  Lab 10/06/24 1432  AST 19  ALT 8  ALKPHOS 43  BILITOT 0.8  PROT 6.5  ALBUMIN  3.2*    Recent Labs  Lab 10/06/24 1432  LIPASE 11   No results for input(s): AMMONIA in the last 168 hours. Coagulation Profile: No results for input(s): INR, PROTIME in the last 168 hours. Unresulted Labs (From admission, onward)     Start     Ordered   10/08/24 0500  CBC with Differential/Platelet  Daily,   R      10/07/24 1151   10/08/24 0500  Basic metabolic panel with GFR  Daily,   R      10/07/24 1151           Antimicrobials/Microbiology: Anti-infectives (  From admission, onward)    Start     Dose/Rate Route Frequency Ordered Stop   10/08/24 1630  ceFAZolin  (ANCEF ) IVPB 2g/100 mL premix        2 g 200 mL/hr over 30 Minutes Intravenous Every 8 hours 10/08/24 1542     10/07/24 1900  vancomycin (VANCOCIN) IVPB 1000 mg/200 mL premix  Status:  Discontinued        1,000 mg 200 mL/hr over 60 Minutes Intravenous Every 24 hours 10/06/24 1904 10/08/24 1542   10/06/24 1900  ceFEPIme (MAXIPIME) 2 g in sodium chloride  0.9 % 100 mL IVPB  Status:  Discontinued        2 g 200 mL/hr over 30 Minutes Intravenous Every 12 hours 10/06/24 1832 10/08/24 1542   10/06/24 1745  vancomycin (VANCOCIN) IVPB 1000 mg/200 mL premix  Status:  Discontinued        1,000 mg 200 mL/hr over 60 Minutes Intravenous  Once 10/06/24 1730 10/06/24 1732   10/06/24 1745  ceFEPIme (MAXIPIME) 2 g in sodium chloride  0.9 % 100 mL IVPB  Status:  Discontinued        2 g 200 mL/hr over 30 Minutes Intravenous  Once 10/06/24 1730 10/06/24 1832   10/06/24 1745  vancomycin (VANCOREADY) IVPB 1500 mg/300 mL        1,500 mg 150 mL/hr over 120 Minutes Intravenous  Once 10/06/24 1732 10/06/24 2130         Component Value Date/Time    SDES  10/08/2024 1718    BLOOD BLOOD RIGHT HAND Performed at [facility], [address], [place], [place] [address]    SPECREQUEST  10/08/2024 1718    BOTTLES DRAWN AEROBIC AND ANAEROBIC Blood Culture adequate volume Performed at [facility], [address] [place], [place] [address]    CULT  10/08/2024 1718    NO GROWTH < 12 HOURS Performed at [facility], [address], [place], [place] [address]    REPTSTATUS PENDING 10/08/2024 1718    Procedures:    Mennie LAMY, MD [facility] 10/09/2024, 11:30 AM

## 2024-10-09 NOTE — Progress Notes (Signed)
   10/09/24 0920  TOC Brief Assessment  Insurance and Status Reviewed  Patient has primary care physician Yes  Home environment has been reviewed Apartment  Prior level of function: Independent  Prior/Current Home Services No current home services  Social Drivers of Health Review SDOH reviewed no interventions necessary  Readmission risk has been reviewed Yes  Transition of care needs transition of care needs identified, TOC will continue to follow    Signed: Heather Saltness, MSW, LCSW Clinical Social Worker Inpatient Care Management 10/09/2024 9:21 AM

## 2024-10-10 ENCOUNTER — Ambulatory Visit: Admitting: Podiatry

## 2024-10-10 ENCOUNTER — Other Ambulatory Visit: Payer: Self-pay | Admitting: Cardiovascular Disease

## 2024-10-10 DIAGNOSIS — L03115 Cellulitis of right lower limb: Secondary | ICD-10-CM | POA: Diagnosis not present

## 2024-10-10 LAB — BASIC METABOLIC PANEL WITH GFR
Anion gap: 11 (ref 5–15)
BUN: 11 mg/dL (ref 8–23)
CO2: 20 mmol/L — ABNORMAL LOW (ref 22–32)
Calcium: 8.7 mg/dL — ABNORMAL LOW (ref 8.9–10.3)
Chloride: 111 mmol/L (ref 98–111)
Creatinine, Ser: 1.08 mg/dL (ref 0.61–1.24)
GFR, Estimated: >60 mL/min (ref >60)
Glucose, Bld: 101 mg/dL — ABNORMAL HIGH (ref 70–99)
Potassium: 3.5 mmol/L (ref 3.5–5.1)
Sodium: 142 mmol/L (ref 135–145)

## 2024-10-10 LAB — CBC WITH DIFFERENTIAL/PLATELET
Abs Immature Granulocytes: 0.12 K/uL — ABNORMAL HIGH (ref 0.00–0.07)
Basophils Absolute: 0 K/uL (ref 0.0–0.1)
Basophils Relative: 0 %
Eosinophils Absolute: 0.1 K/uL (ref 0.0–0.5)
Eosinophils Relative: 2 %
HCT: 22.1 % — ABNORMAL LOW (ref 39.0–52.0)
Hemoglobin: 7.3 g/dL — ABNORMAL LOW (ref 13.0–17.0)
Immature Granulocytes: 2 %
Lymphocytes Relative: 8 %
Lymphs Abs: 0.4 K/uL — ABNORMAL LOW (ref 0.7–4.0)
MCH: 32 pg (ref 26.0–34.0)
MCHC: 33 g/dL (ref 30.0–36.0)
MCV: 96.9 fL (ref 80.0–100.0)
Monocytes Absolute: 0.4 K/uL (ref 0.1–1.0)
Monocytes Relative: 7 %
Neutro Abs: 4 K/uL (ref 1.7–7.7)
Neutrophils Relative %: 81 %
Platelets: 23 K/uL — CL (ref 150–400)
RBC: 2.28 MIL/uL — ABNORMAL LOW (ref 4.22–5.81)
RDW: 18.9 % — ABNORMAL HIGH (ref 11.5–15.5)
Smear Review: NORMAL
WBC: 5 K/uL (ref 4.0–10.5)
nRBC: 0 % (ref 0.0–0.2)

## 2024-10-10 LAB — BPAM RBC
Blood Product Expiration Date: 202512022359
ISSUE DATE / TIME: 202511180959
Unit Type and Rh: 5100

## 2024-10-10 LAB — TYPE AND SCREEN
ABO/RH(D): B POS
Antibody Screen: NEGATIVE
Unit division: 0

## 2024-10-10 LAB — GLUCOSE, CAPILLARY: Glucose-Capillary: 104 mg/dL — ABNORMAL HIGH (ref 70–99)

## 2024-10-10 NOTE — Progress Notes (Signed)
   [facility] has been requested to perform a transesophageal echocardiogram on [name] for bacteremia.  Upon reviewing the patient's chart he has pancytopenia secondary to chemotherapy for his lung cancer.  His platelet counts when last checked were 23 K/uL.  The patient has also had radiation therapy for his lung cancer.  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  This stricture was dilated to 18mm.  The patient denies any ongoing dysphagia.  Discussed both of these concerns with Dr. Francyne who would be doing the TEE tomorrow and he is comfortable proceeding.  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 Alternatives to treatment were discussed, questions were answered. Patient is willing to proceed.   SignedMorse Clause, PA-C  10/10/2024 11:16 AM

## 2024-10-10 NOTE — Progress Notes (Signed)
 PROGRESS NOTE [name]  FMW:[id number] DOB: 01-10-1941 DOA: 10/06/2024 PCP: [name], MD  Brief Narrative/Hospital Course: [name] is a 83 y.o. male with PMH of A-fib on Eliquis , AICD in place, CAD chronic systolic CHF CKD stage III, history of renal stones, hypertension, legally blind, peripheral artery disease, stage IIIc/IV NSCLC-adenocarcinoma, currently on reduced dose of chemotherapy S/P 4 cycle carboplatin , Alimta , and Keytruda  which has been discontinued due to intolerance, previous pancytopenia needing G-CSF presented with increasing fatigue, pain above his pacemaker in the right leg. In the ED noted to have darker erythema along his lateral lower leg, DVT was negative given his immunocompromise status admitted for cellulitis Oncology was consulted  Subjective: Seen and examined Resting comfortably no nausea vomiting chest pain or chills. Overnight patient remains afebrile VSS, on room air Labs reviewed WBC improved to 5.0 hemoglobin assessment 7.3, plt 23k  Assessment and plan:  Right leg cellulitis Bacteremia w/ Staph lugudnesis Immunocompromise status: Admitted with right lower extremity cellulitis, DVT study negative, bacteremia with Staph Luggdunesis ID following repeat blood culture 11/17 NGTD Given pacemaker status EP cardiology following planning for TEE 11/20 and EP eval at [facility] , as Tte-did not exclude vegetation. On Ancef  based on sensitivity  Pancytopenia due to chemotherapy: oncology following  started on filgastrim 300 mcg daily neutropenia resolved, received 1 unit platelet and 1 unit PRBC.  Monitor His last blood transfusion was 2 weeks ago. Recent Labs  Lab 10/08/24 0704 10/09/24 0635 10/10/24 0606  HGB 7.5* 6.8* 7.3*  HCT 23.6* 20.6* 22.1*  WBC 1.3* 1.9* 5.0  PLT 16* 22* 23*    PAF: Rate controlled.Eliquis  remains on hold due to severe thrombocytopenia and anemia   CKD 3b Mild metabolic acidosis: Renal function remains  stable  Constipation: Continue bowel regimen last BM 11/18  CAD/NICM Chronic systolic CHF ICD in place: EF 64-59% from echo 04/30/2023 with G1 DD. BP fairly stable. Continue patient's Entresto , Toprol  Imdur  as BP allows- hold Aldactone , Lasix  for now.   Given bacteremia EP has been made aware TTE-no obvious vegetation but cannot completely exclude EF 45-50% G1 DD  PVD: Monitor  NSCLC stage IIIc/IV, ACC: Initially diagnosed August 2024 received chemoradiation with carboplatin  and paclitaxel  last received consolidation immunotherapy Imfinzi . on systemic treatment with pemetrexed  + pembrolizumab .  Status post cycle 5 on 09/27/2024 Oncology following closely-now with bacteremia  DVT prophylaxis: Place and maintain sequential compression device Start: 10/07/24 1150 no chemical prophylaxis due to thrombocytopenia Code Status:   Code Status: Full Code Family Communication: plan of care discussed with patient at bedside.  Wife updated at the bedside 11/17 Patient status is: Remains hospitalized because of severity of illness Level of care: Med-Surg   Dispo: The patient is from: Home with wife, ambulatory at baseline            Anticipated disposition: TBD.  Planning to transfer to [facility] for TEE and will stay there. Objective: Vitals last 24 hrs: Vitals:   10/09/24 2020 10/10/24 0445 10/10/24 0808 10/10/24 0811  BP: 120/64 (!) 121/57 121/73 121/73  Pulse: 74 74 72 72  Resp: 16 18 16    Temp: 99 F (37.2 C) 98.9 F (37.2 C) 98.3 F (36.8 C)   TempSrc: Oral Oral Oral   SpO2: 99% 99% 99%   Weight:      Height:        Physical Examination: General exam: AAOX3, pleasant HEENT:Oral mucosa moist, Ear/Nose WNL grossly Respiratory system: Bilaterally clear BS,no use of accessory muscle  Cardiovascular system: S1 & S2 +, No JVD. Gastrointestinal system: Abdomen soft,NT,ND, BS+ Nervous System: Alert, awake, moving all extremities,and following commands. Extremities: extremities warm,  MILD tenderness in the right lower extremity Skin: Warm, no rashes MSK: Normal muscle bulk,tone, power   Medications reviewed:  Scheduled Meds:  acetaminophen   650 mg Oral Once   brimonidine   1 drop Both Eyes BID   cycloSPORINE   1 drop Both Eyes BID   dorzolamide -timolol   1 drop Both Eyes BID   feeding supplement  237 mL Oral BID BM   fluticasone  furoate-vilanterol  1 puff Inhalation q morning   isosorbide  mononitrate  30 mg Oral Daily   Latanoprostene Bunod   1 drop Ophthalmic QHS   metoprolol  succinate  25 mg Oral Daily   Netarsudil -Latanoprost   1 drop Both Eyes QHS   pantoprazole   40 mg Oral Daily   sacubitril -valsartan   1 tablet Oral BID   Continuous Infusions:   ceFAZolin  (ANCEF ) IV Stopped (10/10/24 9388)   Diet: Diet Order             Diet regular Room service appropriate? Yes; Fluid consistency: Thin  Diet effective now                    Data Reviewed: I have personally reviewed following labs and imaging studies ( see epic result tab) CBC: Recent Labs  Lab 10/06/24 1543 10/07/24 0623 10/08/24 0704 10/09/24 0635 10/10/24 0606  WBC 1.3* 1.0* 1.3* 1.9* 5.0  NEUTROABS 0.9*  --  0.6* 1.3* 4.0  HGB 7.5* 7.3* 7.5* 6.8* 7.3*  HCT 22.5* 22.0* 23.6* 20.6* 22.1*  MCV 98.7 97.8 100.0 97.6 96.9  PLT 26* 24* 16* 22* 23*   CMP: Recent Labs  Lab 10/06/24 1432 10/07/24 0623 10/08/24 0704 10/09/24 0635 10/10/24 0606  NA 140 139 142 141 142  K 3.9 3.8 4.1 3.5 3.5  CL 109 111 112* 111 111  CO2 20* 20* 20* 20* 20*  GLUCOSE 120* 112* 110* 119* 101*  BUN 22 22 16 16 11   CREATININE 1.17 1.08 1.05 1.15 1.08  CALCIUM  8.8* 8.8* 8.7* 8.8* 8.7*  MG  --  1.9  --   --   --    GFR: Estimated Creatinine Clearance: 46.8 mL/min (by C-G formula based on SCr of 1.08 mg/dL). Recent Labs  Lab 10/06/24 1432  AST 19  ALT 8  ALKPHOS 43  BILITOT 0.8  PROT 6.5  ALBUMIN  3.2*    Recent Labs  Lab 10/06/24 1432  LIPASE 11   No results for input(s): AMMONIA in the last 168  hours. Coagulation Profile: No results for input(s): INR, PROTIME in the last 168 hours. Unresulted Labs (From admission, onward)     Start     Ordered   10/08/24 0500  CBC with Differential/Platelet  Daily,   R      10/07/24 1151   10/08/24 0500  Basic metabolic panel with GFR  Daily,   R      10/07/24 1151           Antimicrobials/Microbiology: Anti-infectives (From admission, onward)    Start     Dose/Rate Route Frequency Ordered Stop   10/08/24 1630  ceFAZolin  (ANCEF ) IVPB 2g/100 mL premix        2 g 200 mL/hr over 30 Minutes Intravenous Every 8 hours 10/08/24 1542     10/07/24 1900  vancomycin  (VANCOCIN ) IVPB 1000 mg/200 mL premix  Status:  Discontinued        1,000  mg 200 mL/hr over 60 Minutes Intravenous Every 24 hours 10/06/24 1904 10/08/24 1542   10/06/24 1900  ceFEPIme (MAXIPIME) 2 g in sodium chloride  0.9 % 100 mL IVPB  Status:  Discontinued        2 g 200 mL/hr over 30 Minutes Intravenous Every 12 hours 10/06/24 1832 10/08/24 1542   10/06/24 1745  vancomycin (VANCOCIN) IVPB 1000 mg/200 mL premix  Status:  Discontinued        1,000 mg 200 mL/hr over 60 Minutes Intravenous  Once 10/06/24 1730 10/06/24 1732   10/06/24 1745  ceFEPIme (MAXIPIME) 2 g in sodium chloride  0.9 % 100 mL IVPB  Status:  Discontinued        2 g 200 mL/hr over 30 Minutes Intravenous  Once 10/06/24 1730 10/06/24 1832   10/06/24 1745  vancomycin (VANCOREADY) IVPB 1500 mg/300 mL        1,500 mg 150 mL/hr over 120 Minutes Intravenous  Once 10/06/24 1732 10/06/24 2130         Component Value Date/Time   SDES  10/08/2024 1718    BLOOD BLOOD RIGHT HAND Performed at [facility], [address], [place], [place] [address]    SPECREQUEST  10/08/2024 1718    BOTTLES DRAWN AEROBIC AND ANAEROBIC Blood Culture adequate volume Performed at [facility], [address] [place], [place] [address]    CULT  10/08/2024 1718    NO GROWTH 2 DAYS Performed at  [facility], [address], [place], [place] [address]    REPTSTATUS PENDING 10/08/2024 1718    Procedures: Procedure(s) (LRB): TRANSESOPHAGEAL ECHOCARDIOGRAM (N/A)   Mennie LAMY, MD [facility] 10/10/2024, 11:04 AM

## 2024-10-10 NOTE — Plan of Care (Signed)

## 2024-10-11 ENCOUNTER — Encounter (HOSPITAL_COMMUNITY)
Admission: EM | Disposition: A | Discharge status: Home Health | Payer: Self-pay | Source: Home / Self Care | Attending: Internal Medicine

## 2024-10-11 ENCOUNTER — Inpatient Hospital Stay (HOSPITAL_COMMUNITY): Admitting: Anesthesiology

## 2024-10-11 ENCOUNTER — Inpatient Hospital Stay (HOSPITAL_COMMUNITY)

## 2024-10-11 ENCOUNTER — Encounter (HOSPITAL_COMMUNITY): Payer: Self-pay | Admitting: Cardiovascular Disease

## 2024-10-11 ENCOUNTER — Other Ambulatory Visit: Payer: Self-pay

## 2024-10-11 DIAGNOSIS — I2510 Atherosclerotic heart disease of native coronary artery without angina pectoris: Secondary | ICD-10-CM | POA: Diagnosis not present

## 2024-10-11 DIAGNOSIS — I5032 Chronic diastolic (congestive) heart failure: Secondary | ICD-10-CM | POA: Diagnosis not present

## 2024-10-11 DIAGNOSIS — I472 Ventricular tachycardia, unspecified: Secondary | ICD-10-CM

## 2024-10-11 DIAGNOSIS — I330 Acute and subacute infective endocarditis: Secondary | ICD-10-CM

## 2024-10-11 DIAGNOSIS — B957 Other staphylococcus as the cause of diseases classified elsewhere: Secondary | ICD-10-CM | POA: Diagnosis not present

## 2024-10-11 DIAGNOSIS — B958 Unspecified staphylococcus as the cause of diseases classified elsewhere: Secondary | ICD-10-CM | POA: Diagnosis not present

## 2024-10-11 DIAGNOSIS — I420 Dilated cardiomyopathy: Secondary | ICD-10-CM | POA: Diagnosis not present

## 2024-10-11 DIAGNOSIS — R7881 Bacteremia: Secondary | ICD-10-CM | POA: Insufficient documentation

## 2024-10-11 DIAGNOSIS — I130 Hypertensive heart and chronic kidney disease with heart failure and stage 1 through stage 4 chronic kidney disease, or unspecified chronic kidney disease: Secondary | ICD-10-CM | POA: Diagnosis not present

## 2024-10-11 DIAGNOSIS — Z95810 Presence of automatic (implantable) cardiac defibrillator: Secondary | ICD-10-CM | POA: Diagnosis not present

## 2024-10-11 DIAGNOSIS — L03115 Cellulitis of right lower limb: Secondary | ICD-10-CM | POA: Diagnosis not present

## 2024-10-11 DIAGNOSIS — I5030 Unspecified diastolic (congestive) heart failure: Secondary | ICD-10-CM

## 2024-10-11 DIAGNOSIS — I38 Endocarditis, valve unspecified: Secondary | ICD-10-CM | POA: Diagnosis not present

## 2024-10-11 DIAGNOSIS — N1832 Chronic kidney disease, stage 3b: Secondary | ICD-10-CM

## 2024-10-11 HISTORY — PX: TRANSESOPHAGEAL ECHOCARDIOGRAM (CATH LAB): EP1270

## 2024-10-11 LAB — CBC WITH DIFFERENTIAL/PLATELET
Abs Immature Granulocytes: 0.42 K/uL — ABNORMAL HIGH (ref 0.00–0.07)
Basophils Absolute: 0 K/uL (ref 0.0–0.1)
Basophils Relative: 0 %
Eosinophils Absolute: 0.2 K/uL (ref 0.0–0.5)
Eosinophils Relative: 2 %
HCT: 24 % — ABNORMAL LOW (ref 39.0–52.0)
Hemoglobin: 7.8 g/dL — ABNORMAL LOW (ref 13.0–17.0)
Immature Granulocytes: 6 %
Lymphocytes Relative: 7 %
Lymphs Abs: 0.5 K/uL — ABNORMAL LOW (ref 0.7–4.0)
MCH: 31.7 pg (ref 26.0–34.0)
MCHC: 32.5 g/dL (ref 30.0–36.0)
MCV: 97.6 fL (ref 80.0–100.0)
Monocytes Absolute: 0.6 K/uL (ref 0.1–1.0)
Monocytes Relative: 8 %
Neutro Abs: 5.3 K/uL (ref 1.7–7.7)
Neutrophils Relative %: 77 %
Platelets: 33 K/uL — ABNORMAL LOW (ref 150–400)
RBC: 2.46 MIL/uL — ABNORMAL LOW (ref 4.22–5.81)
RDW: 18.3 % — ABNORMAL HIGH (ref 11.5–15.5)
Smear Review: NORMAL
WBC: 6.9 K/uL (ref 4.0–10.5)
nRBC: 0 % (ref 0.0–0.2)

## 2024-10-11 LAB — BASIC METABOLIC PANEL WITH GFR
Anion gap: 8 (ref 5–15)
BUN: 10 mg/dL (ref 8–23)
CO2: 23 mmol/L (ref 22–32)
Calcium: 8.9 mg/dL (ref 8.9–10.3)
Chloride: 111 mmol/L (ref 98–111)
Creatinine, Ser: 1.21 mg/dL (ref 0.61–1.24)
GFR, Estimated: 59 mL/min — ABNORMAL LOW (ref >60)
Glucose, Bld: 114 mg/dL — ABNORMAL HIGH (ref 70–99)
Potassium: 3.3 mmol/L — ABNORMAL LOW (ref 3.5–5.1)
Sodium: 141 mmol/L (ref 135–145)

## 2024-10-11 LAB — CULTURE, BLOOD (ROUTINE X 2)
Culture: NO GROWTH
Special Requests: ADEQUATE

## 2024-10-11 LAB — ECHO TEE

## 2024-10-11 SURGERY — TRANSESOPHAGEAL ECHOCARDIOGRAM (TEE) (CATHLAB)
Anesthesia: Monitor Anesthesia Care

## 2024-10-11 MED ORDER — CEFAZOLIN SODIUM-DEXTROSE 2-4 GM/100ML-% IV SOLN
2.0000 g | Freq: Two times a day (BID) | INTRAVENOUS | Status: DC
  Administered 2024-10-11 – 2024-10-12 (×2): 2 g via INTRAVENOUS
  Filled 2024-10-11 (×2): qty 100

## 2024-10-11 MED ORDER — SODIUM CHLORIDE 0.9% IV SOLUTION: Freq: Once | INTRAVENOUS | Status: DC

## 2024-10-11 MED ORDER — LIDOCAINE 2% (20 MG/ML) 5 ML SYRINGE
INTRAMUSCULAR | Status: DC | PRN
  Administered 2024-10-11: 100 mg via INTRAVENOUS

## 2024-10-11 MED ORDER — SODIUM CHLORIDE 0.9 % IV SOLN: INTRAVENOUS | Status: DC | PRN

## 2024-10-11 MED ORDER — PROPOFOL 500 MG/50ML IV EMUL
INTRAVENOUS | Status: DC | PRN
  Administered 2024-10-11: 150 ug/kg/min via INTRAVENOUS

## 2024-10-11 MED ORDER — SPIRONOLACTONE 25 MG PO TABS: 12.5000 mg | ORAL_TABLET | Freq: Every day | ORAL | 3 refills | Status: DC

## 2024-10-11 MED ORDER — PROPOFOL 10 MG/ML IV BOLUS
INTRAVENOUS | Status: DC | PRN
  Administered 2024-10-11: 20 mg via INTRAVENOUS

## 2024-10-11 MED ORDER — POTASSIUM CHLORIDE 10 MEQ/100ML IV SOLN: 10.0000 meq | Freq: Once | INTRAVENOUS | Status: DC

## 2024-10-11 NOTE — Op Note (Signed)
 INDICATIONS: infective endocarditis  PROCEDURE:   Informed consent was obtained prior to the procedure. The risks, benefits and alternatives for the procedure were discussed and the patient comprehended these risks.  Risks include, but are not limited to, cough, sore throat, vomiting, nausea, somnolence, esophageal and stomach trauma or perforation, bleeding, low blood pressure, aspiration, pneumonia, infection, trauma to the teeth and death.    After a procedural time-out, the oropharynx was anesthetized with 20% benzocaine spray.   During this procedure the patient was administered IV propofol  by Anesthesiology, Dr. Julieann  The transesophageal probe was inserted in the esophagus and stomach without difficulty and multiple views were obtained.  The patient was kept under observation until the patient left the procedure room.  The patient left the procedure room in stable condition.   Agitated microbubble saline contrast was not administered.  COMPLICATIONS:    There were no immediate complications.  FINDINGS:  No vegetations are seen on the cardiac valves or the pacemaker/ICD leads. LVEF 50%.   RECOMMENDATIONS:     Evaluate for other source of bacteremia. He is not a good candidate for device/lead extraction anyway.  Time Spent Directly with the Patient:  45 minutes   [name] 10/11/2024, 11:32 AM

## 2024-10-11 NOTE — Progress Notes (Signed)
 Vincente, RN made aware that PICC team will follow up 11/21 to see what discharge plan is and make plan for when PICC will be placed.

## 2024-10-11 NOTE — Transfer of Care (Signed)
 Immediate Anesthesia Transfer of Care Note  Patient: [name]  Procedure(s) Performed: TRANSESOPHAGEAL ECHOCARDIOGRAM  Patient Location: Cath Lab  Anesthesia Type:MAC  Level of Consciousness: awake, alert , and oriented  Airway & Oxygen Therapy: Patient Spontanous Breathing and Patient connected to nasal cannula oxygen  Post-op Assessment: Report given to RN and Post -op Vital signs reviewed and stable  Post vital signs: Reviewed and stable  Last Vitals:  Vitals Value Taken Time  BP 98/75 1136  Temp 97 1136  Pulse 74 10/11/24 11:36  Resp 24 10/11/24 11:36  SpO2 100 % 10/11/24 11:36  Vitals shown include unfiled device data.  Last Pain:  Vitals:   10/11/24 1100  TempSrc: Temporal  PainSc:       Patients Stated Pain Goal: 0 (10/11/24 1010)  Complications: No notable events documented.

## 2024-10-11 NOTE — Progress Notes (Signed)
  Echocardiogram Echocardiogram Transesophageal has been performed.  [name] 10/11/2024, 11:41 AM

## 2024-10-11 NOTE — Anesthesia Postprocedure Evaluation (Signed)
 Anesthesia Post Note  Patient: [name]  Procedure(s) Performed: TRANSESOPHAGEAL ECHOCARDIOGRAM     Patient location during evaluation: Cath Lab Anesthesia Type: MAC Level of consciousness: awake and alert Pain management: pain level controlled Vital Signs Assessment: post-procedure vital signs reviewed and stable Respiratory status: spontaneous breathing, nonlabored ventilation, respiratory function stable and patient connected to nasal cannula oxygen Cardiovascular status: stable and blood pressure returned to baseline Postop Assessment: no apparent nausea or vomiting Anesthetic complications: no   There were no known notable events for this encounter.  Last Vitals:  Vitals:   10/11/24 1230 10/11/24 1339  BP: 125/69 135/86  Pulse:  74  Resp: 17 20  Temp:  36.5 C  SpO2:  100%    Last Pain:  Vitals:   10/11/24 1339  TempSrc: Oral  PainSc:                  Garnette DELENA Gab

## 2024-10-11 NOTE — Interval H&P Note (Signed)
 History and Physical Interval Note:  10/11/2024 11:31 AM  [name] Side  has presented today for surgery, with the diagnosis of Bacteremia.  The various methods of treatment have been discussed with the patient and family. After consideration of risks, benefits and other options for treatment, the patient has consented to  Procedure(s): TRANSESOPHAGEAL ECHOCARDIOGRAM (N/A) as a surgical intervention.  The patient's history has been reviewed, patient examined, no change in status, stable for surgery.  I have reviewed the patient's chart and labs.  Questions were answered to the patient's satisfaction.     [name]

## 2024-10-11 NOTE — Consult Note (Addendum)
 ELECTROPHYSIOLOGY CONSULT NOTE    Patient ID: [name] MRN: [id number], DOB/AGE: December 07, 1940 83 y.o.  Admit date: 10/06/2024 Date of Consult: 10/11/2024  Primary Physician: [name], MD Primary Cardiologist: Jerel Balding, MD  Electrophysiologist: Device managed by Dr. Balding   Referring Provider: Dr. Christobal  Patient Profile: [name] is a 83 y.o. 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 Patient is being seen today for the evaluation of bacteremia witj CRT-D implant at the request of Dr. Christobal.  HPI:  [name] is a 83 y.o. male with significant cardiac history as noted above. He presented to the ED on 11/15 with recurrent vomiting and leg pain. His physical exam was concerning for right leg cellulitis and given pancytopenic status, he was admitted for further management. 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 ID consulted and patient switched from Vancomycin and Cefepime to Cefazolin . Given CRT-D, EP asked to see patient.   Patient seen today in cath holding prior to schedule TEE. He feels fatigued today and still has some RLE discomfort but otherwise feels he is doing okay. Denies focal cardiac complaints including chest pain, palpitations, dyspnea/orthopnea.   Labs Potassium3.3* (11/20 9286)   Creatinine, ser  1.21 (11/20 0713) PLT  33* (11/20 0713) HGB  7.8* (11/20 0713) WBC 6.9 (11/20 0713)  .    Past Medical History:  Diagnosis Date   AICD (automatic cardioverter/defibrillator) present    CAD (coronary artery disease) 80% stenosis diag of the LAD, 30% in OM2 branch of LCX in 2009    a. Nonobstructive CAD by cath 11/2011 with the exception of the pre-existing diagonal branch #2  lesion.   Chronic systolic CHF (congestive heart failure) (HCC)    CKD (chronic kidney disease) stage 3, GFR 30-59 ml/min (HCC)    Colon polyp, hyperplastic    History of kidney stones    History of radiation therapy    Left Lung- 08/17/23-09/29/23- Dr. Lynwood Nasuti   History of stress test 06/01/2012   Normal myocardial perfusion study. compared to the previous study there is no significant change. this is a low risk scan   Hypertension    Legally blind    both eyes   Myocardial infarction [facility]) 11/22/2011   NICM (nonischemic cardiomyopathy) (HCC)    a. Remote hx of dilated NICM with EF ranging 20-45%, including normal EF by echo (55-60%) in 2014.   Peripheral arterial disease    a. 06/2014: ABI right 0.99, left 1.2, LE dopplers revealing an occluded right posterior tibial. As symptoms were not felt r/t claudication, no further w/u at the time.   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 Requiring discontinuation of BB dose.   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     Surgical History:  Past Surgical History:  Procedure Laterality Date   BACK SURGERY     BIV UPGRADE N/A 11/08/2019   Procedure: BIV UPGRADE;  Surgeon: Waddell Danelle ORN, MD;  Location: [facility] INVASIVE CV LAB;  Service: Cardiovascular;  Laterality: N/A;   BRONCHIAL BIOPSY  07/05/2023   Procedure: BRONCHIAL BIOPSIES;  Surgeon: Shelah Lamar RAMAN, MD;  Location: [facility] ENDOSCOPY;  Service:  Pulmonary;;   BRONCHIAL BRUSHINGS  07/05/2023   Procedure: BRONCHIAL BRUSHINGS;  Surgeon: Shelah Lamar RAMAN, MD;  Location[facility] ENDOSCOPY;  Service: Pulmonary;;   BRONCHIAL NEEDLE ASPIRATION BIOPSY  07/05/2023   Procedure: BRONCHIAL NEEDLE ASPIRATION BIOPSIES;  Surgeon: Shelah Lamar RAMAN, MD;  Location: [facility] ENDOSCOPY;  Service: Pulmonary;;   BRONCHIAL WASHINGS  07/05/2023   Procedure: BRONCHIAL WASHINGS;  Surgeon: Shelah Lamar RAMAN, MD;  Location: [facility] ENDOSCOPY;  Service: Pulmonary;;   CARDIAC CATHETERIZATION  11/2011   CARDIAC CATHETERIZATION  11/2011   didn't demonstrate high grade obstructive disease to account for his LV dysfunction.   CATARACT EXTRACTION, BILATERAL  1990's   CYSTOSCOPY     CYSTOSCOPY WITH BIOPSY Bilateral 08/03/2021   Procedure: CYSTOSCOPY WITH BLADDER BIOPSY, BILATERAL RETROGRADE PYELOGRAM;  Surgeon: Carolee Sherwood JONETTA DOUGLAS, MD;  Location: [facility] ORS;  Service: Urology;  Laterality: Bilateral;  REQUESTING 45 MINS   CYSTOSCOPY WITH URETHRAL DILATATION N/A 05/04/2013   Procedure: CYSTOSCOPY WITH URETHRAL DILATATION;  Surgeon: Alm RAMAN Molt, MD;  Location: [facility] NEURO ORS;  Service: Neurosurgery;  Laterality: N/A;  with insertion of foley catheter   EP study and ablation of VT  7/13   PVC focus mapped to the right coronary cusp of the aorta, limited ablation performed due to proximity of the focus to the right coronary artery   EYE SURGERY     FINE NEEDLE ASPIRATION  07/05/2023   Procedure: FINE NEEDLE ASPIRATION (FNA) LINEAR;  Surgeon: Shelah Lamar RAMAN, MD;  Location: [facility] ENDOSCOPY;  Service: Pulmonary;;   LEFT HEART CATH AND CORONARY ANGIOGRAPHY N/A 11/07/2019   Procedure: LEFT HEART CATH AND CORONARY ANGIOGRAPHY;  Surgeon: Darron Deatrice LABOR, MD;  Location: [facility] INVASIVE CV LAB;  Service: Cardiovascular;  Laterality: N/A;   LEFT HEART CATHETERIZATION WITH CORONARY ANGIOGRAM N/A 11/25/2011   Procedure: LEFT HEART CATHETERIZATION WITH CORONARY ANGIOGRAM;  Surgeon: Alm LELON Clay, MD;  Location: [facility] CATH LAB;  Service: Cardiovascular;  Laterality: N/A;   PACEMAKER IMPLANT N/A 07/12/2018   Procedure: PACEMAKER IMPLANT;  Surgeon: Francyne Headland, MD;  Location: [facility] INVASIVE CV LAB;  Service: Cardiovascular;  Laterality: N/A;   POSTERIOR FUSION LUMBAR SPINE  1979   ROTATOR CUFF REPAIR  2000's   left   V-TACH ABLATION N/A 06/06/2012   Procedure: V-TACH ABLATION;  Surgeon: Lynwood Rakers, MD;  Location: [facility] CATH LAB;  Service:  Cardiovascular;  Laterality: N/A;   VIDEO BRONCHOSCOPY WITH ENDOBRONCHIAL ULTRASOUND N/A 07/05/2023   Procedure: VIDEO BRONCHOSCOPY WITH ENDOBRONCHIAL ULTRASOUND;  Surgeon: Shelah Lamar RAMAN, MD;  Location: [facility] ENDOSCOPY;  Service: Pulmonary;  Laterality: N/A;     Medications Prior to Admission  Medication Sig Dispense Refill Last Dose/Taking   apixaban  (ELIQUIS ) 2.5 MG TABS tablet Take 1 tablet (2.5 mg total) by mouth 2 (two) times daily. 180 tablet 1 10/05/2024 Morning   BREO ELLIPTA  100-25 MCG/INH AEPB Inhale 1 puff into the lungs every morning.   10/05/2024 Morning   brimonidine  (ALPHAGAN ) 0.2 % ophthalmic solution Place 1 drop into both eyes 2 (two) times daily.   10/06/2024 Morning   cycloSPORINE  (RESTASIS ) 0.05 % ophthalmic emulsion Place 1 drop into both eyes 2 (two) times daily.   10/06/2024 Morning   dorzolamide -timolol  (COSOPT ) 22.3-6.8 MG/ML ophthalmic solution Place 1 drop into both eyes 2 (two) times daily.   10/06/2024 Morning   doxycycline  (VIBRAMYCIN ) 100 MG capsule Take 1 capsule (100 mg total) by mouth 2 (two) times daily. 20 capsule 0 10/05/2024 Evening   ENTRESTO  24-26 MG TAKE 1 TABLET  BY MOUTH TWICE DAILY 180 tablet 3 10/05/2024 Evening   folic acid  (FOLVITE ) 1 MG tablet Take 1 tablet (1 mg total) by mouth daily. Start 7 days before pemetrexed  chemotherapy. Continue until 21 days after pemetrexed  completed. 100 tablet 3 Past Month   furosemide  (LASIX ) 20 MG tablet Take 0.5 tablets (10 mg total) by mouth daily. 30 tablet 1 10/05/2024 Morning   Glucagon , rDNA, (GLUCAGON  EMERGENCY) 1 MG KIT Inject 1 mg into the skin as needed for up to 2 doses (Severe low blood sugar). 1 kit 0 Unknown   hydrocortisone  1 % lotion Apply 1 Application topically 2 (two) times daily. 113 g 0 10/06/2024 Morning   isosorbide  mononitrate (IMDUR ) 30 MG 24 hr tablet Take 30 mg by mouth daily.   Unknown   lidocaine  (LIDODERM ) 5 % Place 1 patch onto the skin daily. Remove & Discard patch within 12 hours or as  directed by MD 30 patch 0 10/05/2024 Morning   metoprolol  succinate (TOPROL -XL) 25 MG 24 hr tablet Take 1 tablet (25 mg total) by mouth daily. 90 tablet 3 10/05/2024 Morning   omeprazole  (PRILOSEC) 20 MG capsule Take 1 capsule (20 mg total) by mouth daily. 30 capsule 2 10/05/2024 Morning   ondansetron  (ZOFRAN ) 8 MG tablet Take 1 tablet (8 mg total) by mouth every 8 (eight) hours as needed for nausea or vomiting. Start on the third day after carboplatin . 30 tablet 1 Past Month   RHOPRESSA 0.02 % SOLN Apply 1 drop to eye every evening.   Unknown   ROCKLATAN  0.02-0.005 % SOLN Place 1 drop into both eyes at bedtime.   10/05/2024 Bedtime   VYZULTA 0.024 % SOLN Apply 1 drop to eye at bedtime.   10/05/2024 Bedtime   Continuous Glucose Sensor (FREESTYLE LIBRE 3 PLUS SENSOR) MISC SMARTSIG:Topical Every 3 Months      insulin  isophane & regular human KwikPen (HUMULIN 70/30 MIX) (70-30) 100 UNIT/ML KwikPen Inject 25 Units into the skin in the morning and at bedtime. (Patient not taking: Reported on 10/06/2024) 15 mL 0 Not Taking   spironolactone  (ALDACTONE ) 25 MG tablet Take 0.5 tablets (12.5 mg total) by mouth daily. 45 tablet 3     Inpatient Medications:   acetaminophen   650 mg Oral Once   brimonidine   1 drop Both Eyes BID   cycloSPORINE   1 drop Both Eyes BID   dorzolamide -timolol   1 drop Both Eyes BID   feeding supplement  237 mL Oral BID BM   fluticasone  furoate-vilanterol  1 puff Inhalation q morning   isosorbide  mononitrate  30 mg Oral Daily   Latanoprostene Bunod  1 drop Ophthalmic QHS   metoprolol  succinate  25 mg Oral Daily   Netarsudil -Latanoprost   1 drop Both Eyes QHS   pantoprazole   40 mg Oral Daily   sacubitril -valsartan   1 tablet Oral BID    Allergies:  Allergies  Allergen Reactions   Paclitaxel  Other (See Comments) and Hypertension    Pt reported chest pain, back pain, and SOB during first infusion (see notes from 9/23). Infusion discontinued.    Allegra [Fexofenadine] Other (See  Comments)    Makes him dizzy and feels like he will fall.   Sonafine [Wound Dressings] Itching    Itching and burning to skin  08/25/23    Family History  Problem Relation Age of Onset   Asthma Mother    Other Other        Unsure if any heart disease in his family.     Physical Exam:  Vitals:   10/10/24 1322 10/10/24 2000 10/11/24 0445 10/11/24 0811  BP: (!) 114/58 136/66 (!) 149/82   Pulse: 74 77 78   Resp: 20 18 18    Temp: 98.4 F (36.9 C) 98.7 F (37.1 C) 98.9 F (37.2 C)   TempSrc: Oral Oral Oral   SpO2: 100% 100% 100% 96%  Weight:      Height:        GEN- NAD, A&O x 3, normal affect. Chronically ill appearing HEENT: Normocephalic, atraumatic Lungs- CTAB, Normal effort.  Heart- Regular rate and rhythm, No M/G/R.  GI- Soft, NT, ND.  Extremities- No clubbing, cyanosis, or edema   Radiology/Studies: ECHOCARDIOGRAM COMPLETE Result Date: 10/09/2024    ECHOCARDIOGRAM REPORT   Patient Name:   [name] Date of Exam: 10/09/2024 Medical Rec #:  [id number]        Height:       67.0 in Accession #:    [id number]       Weight:       149.0 lb Date of Birth:  09/22/1941         BSA:          1.784 m Patient Age:    83 years         BP:           101/58 mmHg Patient Gender: M                HR:           100 bpm. Exam Location:  Inpatient Procedure: 2D Echo (Both Spectral and Color Flow Doppler were utilized during            procedure). Indications:    Endocarditis  History:        Patient has prior history of Echocardiogram examinations.                 Pacemaker; Signs/Symptoms:Bacteremia.  Sonographer:    Norleen Amour Referring Phys: [id number] [facility] IMPRESSIONS  1. NO obvious vegetations though cannot completely exclude If clinically indicated, recommend TEE.  2. HYpokinesis of the lateral wall (base, mid). Left ventricular ejection fraction, by estimation, is 45 to 50%. The left ventricle has mildly decreased function. Left ventricular diastolic parameters are consistent  with Grade I diastolic dysfunction (impaired relaxation).  3. Right ventricular systolic function is normal. The right ventricular size is normal. There is normal pulmonary artery systolic pressure.  4. Left atrial size was moderately dilated.  5. The mitral valve is normal in structure. Mild mitral valve regurgitation.  6. The aortic valve is tricuspid. Aortic valve regurgitation is not visualized. Aortic valve sclerosis/calcification is present, without any evidence of aortic stenosis.  7. The inferior vena cava is normal in size with greater than 50% respiratory variability, suggesting right atrial pressure of 3 mmHg. FINDINGS  Left Ventricle: HYpokinesis of the lateral wall (base, mid). Left ventricular ejection fraction, by estimation, is 45 to 50%. The left ventricle has mildly decreased function. The left ventricular internal cavity size was normal in size. There is no left ventricular hypertrophy. Left ventricular diastolic parameters are consistent with Grade I diastolic dysfunction (impaired relaxation). Right Ventricle: The right ventricular size is normal. Right vetricular wall thickness was not assessed. Right ventricular systolic function is normal. There is normal pulmonary artery systolic pressure. The tricuspid regurgitant velocity is 2.87 m/s, and with an assumed right atrial pressure of 3 mmHg, the estimated right ventricular systolic pressure is 35.9 mmHg. Left Atrium: Left atrial  size was moderately dilated. Right Atrium: Right atrial size was normal in size. Pericardium: Trivial pericardial effusion is present. Mitral Valve: The mitral valve is normal in structure. Mild mitral valve regurgitation. MV peak gradient, 4.0 mmHg. The mean mitral valve gradient is 1.0 mmHg. Tricuspid Valve: The tricuspid valve is normal in structure. Tricuspid valve regurgitation is mild. Aortic Valve: The aortic valve is tricuspid. Aortic valve regurgitation is not visualized. Aortic valve sclerosis/calcification is  present, without any evidence of aortic stenosis. Pulmonic Valve: The pulmonic valve was normal in structure. Pulmonic valve regurgitation is not visualized. Aorta: The aortic root and ascending aorta are structurally normal, with no evidence of dilitation. Venous: The inferior vena cava is normal in size with greater than 50% respiratory variability, suggesting right atrial pressure of 3 mmHg. IAS/Shunts: No atrial level shunt detected by color flow Doppler. Additional Comments: A device lead is visualized.  LEFT VENTRICLE PLAX 2D LVIDd:         4.60 cm     Diastology LVIDs:         3.60 cm     LV e' medial:    6.85 cm/s LV PW:         1.10 cm     LV E/e' medial:  15.8 LV IVS:        0.80 cm     LV e' lateral:   9.36 cm/s LVOT diam:     2.20 cm     LV E/e' lateral: 11.5 LV SV:         48 LV SV Index:   27 LVOT Area:     3.80 cm LV IVRT:       90 msec  LV Volumes (MOD) LV vol d, MOD A2C: 84.1 ml LV vol d, MOD A4C: 93.6 ml LV vol s, MOD A2C: 44.2 ml LV vol s, MOD A4C: 51.2 ml LV SV MOD A2C:     39.9 ml LV SV MOD A4C:     93.6 ml LV SV MOD BP:      43.3 ml RIGHT VENTRICLE             IVC RV Basal diam:  3.50 cm     IVC diam: 1.50 cm RV S prime:     16.10 cm/s TAPSE (M-mode): 2.5 cm      PULMONARY VEINS                             Diastolic Velocity: 72.40 cm/s                             S/D Velocity:       0.80                             Systolic Velocity:  57.80 cm/s LEFT ATRIUM             Index        RIGHT ATRIUM           Index LA Vol (A2C):   68.0 ml 38.11 ml/m  RA Area:     18.50 cm LA Vol (A4C):   78.5 ml 43.99 ml/m  RA Volume:   44.80 ml  25.11 ml/m LA Biplane Vol: 74.2 ml 41.58 ml/m  AORTIC VALVE             PULMONIC  VALVE LVOT Vmax:   73.10 cm/s  PV Vmax:       1.11 m/s LVOT Vmean:  45.400 cm/s PV Peak grad:  4.9 mmHg LVOT VTI:    0.127 m  AORTA Ao Root diam: 2.70 cm Ao Asc diam:  3.00 cm MITRAL VALVE                TRICUSPID VALVE MV Area (PHT): 4.46 cm     TR Peak grad:   32.9 mmHg MV Area VTI:    2.04 cm     TR Vmax:        287.00 cm/s MV Peak grad:  4.0 mmHg MV Mean grad:  1.0 mmHg     SHUNTS MV Vmax:       1.00 m/s     Systemic VTI:  0.13 m MV Vmean:      54.9 cm/s    Systemic Diam: 2.20 cm MV Decel Time: 170 msec MV E velocity: 108.00 cm/s MV A velocity: 52.30 cm/s MV E/A ratio:  2.07 Rodney Gull MD Electronically signed by Rodney Gull MD Signature Date/Time: 10/09/2024/1:01:31 PM    Final    VAS US  LOWER EXTREMITY VENOUS (DVT) (ONLY [facility]) Result Date: 10/07/2024  Lower Venous DVT Study Patient Name:  [name]  Date of Exam:   10/06/2024 Medical Rec #: [id number]         Accession #:    [id number] Date of Birth: 09-Feb-1941          Patient Gender: M Patient Age:   11 years Exam Location:  [facility] Procedure:      VAS US  LOWER EXTREMITY VENOUS (DVT) Referring Phys: MATTHEW TRIFAN --------------------------------------------------------------------------------  Indications: Pain, and Darkened erythema along lateral lower leg.  Risk Factors: Cancer: Primary adenocarcinoma of upper lobe of left lung, stage IIIC/IV, on chemotherapy Paroxysmal atrial fibrillation, ICD. Limitations: Pain with compression maneuvers in calf. Comparison Study: Prior negative right LEV done 02/26/16 Performing Technologist: Alberta Lis RVS  Examination Guidelines: A complete evaluation includes B-mode imaging, spectral Doppler, color Doppler, and power Doppler as needed of all accessible portions of each vessel. Bilateral testing is considered an integral part of a complete examination. Limited examinations for reoccurring indications may be performed as noted. The reflux portion of the exam is performed with the patient in reverse Trendelenburg.  +---------+---------------+---------+-----------+----------+---------------+ RIGHT    CompressibilityPhasicitySpontaneityPropertiesThrombus Aging  +---------+---------------+---------+-----------+----------+---------------+ CFV      Full           Yes       Yes                                  +---------+---------------+---------+-----------+----------+---------------+ SFJ      Full                                                         +---------+---------------+---------+-----------+----------+---------------+ FV Prox  Full           Yes      Yes                                  +---------+---------------+---------+-----------+----------+---------------+ FV Mid   Full                                                         +---------+---------------+---------+-----------+----------+---------------+  FV DistalFull                                                         +---------+---------------+---------+-----------+----------+---------------+ PFV      Full           Yes      Yes                                  +---------+---------------+---------+-----------+----------+---------------+ POP      Full           Yes      Yes                                  +---------+---------------+---------+-----------+----------+---------------+ PTV                                                   patent by color +---------+---------------+---------+-----------+----------+---------------+ PERO                                                  patent by color +---------+---------------+---------+-----------+----------+---------------+   +----+---------------+---------+-----------+----------+--------------+ LEFTCompressibilityPhasicitySpontaneityPropertiesThrombus Aging +----+---------------+---------+-----------+----------+--------------+ CFV Full           Yes      Yes                                 +----+---------------+---------+-----------+----------+--------------+ SFJ Full                                                        +----+---------------+---------+-----------+----------+--------------+     Summary: RIGHT: - There is no evidence of deep vein thrombosis in the lower extremity.  -  Ultrasound characteristics of enlarged lymph nodes are noted in the groin.  LEFT: - No evidence of common femoral vein obstruction.  - Ultrasound characteristics of enlarged lymph nodes noted in the groin.  *See table(s) above for measurements and observations. Electronically signed by Penne Colorado MD on 10/07/2024 at 10:44:45 AM.    Final    DG Chest 2 View Result Date: 10/06/2024 EXAM: 2 VIEW(S) XRAY OF THE CHEST 10/06/2024 02:13:00 PM COMPARISON: 07/12/2024 CLINICAL HISTORY: chest pain FINDINGS: LINES, TUBES AND DEVICES: Stable left subclavian AICD. LUNGS AND PLEURA: Persistent lingular focal masslike consolidation. No pleural effusion. No pneumothorax. HEART AND MEDIASTINUM: AP window is obscured, suggesting mediastinal adenopathy as before. No acute abnormality of the cardiac silhouette. BONES AND SOFT TISSUES: Thoracolumbar fixation hardware partially visualized without acute finding. No acute osseous abnormality. IMPRESSION: 1. Persistent lingular focal masslike consolidation, unchanged from prior study. 2. AP window is obscured suggesting mediastinal adenopathy, unchanged from prior study. Electronically signed by: Dayne Hassell MD 10/06/2024 02:20 PM EST RP Workstation: [id number]  EKG: AV paced with isolated PVC. RBBB morphology (personally reviewed)  TELEMETRY: Unable to review telemetry from [facility]   DEVICE HISTORY:  August 2019 [employer] Assurity for AV block December 2020 upgraded to [facility] CRT-D   Assessment/Plan:  Ventricular Tachycardia s/p CRT-D Bacteremia- staphyloccus lugdunensis Patient s/p BiV ICD upgrade in 2020 with Dr. Waddell for secondary prevention after syncope with VT. Now admitted with bacteremia felt secondary to cellulitis. He is scheduled to undergo TEE today with Dr. Francyne. Patient reported to be device dependent. Given advanced age, co-morbidities, profound pancytopenia, the patient not a candidate for device extraction. Patient advised of this  recommendation and expresses relief avoiding extraction. He will need lifelong abx suppression per ID recs. Will obtain device interrogation today. Case previously reviewed with Dr. Kennyth who is currently in a procedure, he will see patient when able afterwards.    For questions or updates, please contact [facility] Please consult [email or web address] for contact info under     Signed, Artist Pouch, PA-C  10/11/2024, 9:57 AM     I have seen, examined the patient, and reviewed the above assessment and plan.    HPI: [name] is a 83 y.o. 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 He was found to have right lower extremity cellulitis. This was in setting of pancytopenia from recent chemotherapy. He was started on vancomycin  and cefepime . Blood cultures returned positive for staph lugdunensis (1 of 4). EP has been consulted given presence of ICD.    General: Chronically ill appearing, frail, in no acute distress.  Neck: No JVD.  Cardiac: Normal rate, regular rhythm. Well healed left chest ICD pocket.  Resp: Normal work of breathing.  Ext: No edema.  Neuro: No gross focal deficits.  Psych: Normal affect.   Assessment: [name] is a 83 y.o. 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 Blood cultures were positive for staph lugdeneinsis in 1 of 4 bottles.  He underwent TEE today with no valvular vegetations  or lead related vegetations.  His device pocket is not obviously infected.  Given his advanced age, frailty, stage IV lung cancer, and pancytopenia, would favor medical management over lead extraction at this time.  Discussed with patient regarding risks and benefits of medical management and lead extraction and he favors a more conservative approach.  Problem List:  Staphylococcal bacteremia S/p CRT-D Complete heart block Ventricular tachycardia   Plan:  - Recommend medical management with suppressive antibiotics per ID guidance.   Fonda Kennyth, MD 10/11/2024 9:31 PM

## 2024-10-11 NOTE — Anesthesia Preprocedure Evaluation (Addendum)
 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 NO obvious vegetations though cannot completely exclude If clinically  indicated, recommend TEE.   2. HYpokinesis of the lateral wall (base, mid). Left ventricular ejection  fraction, by estimation, is 45 to 50%. The left ventricle has mildly  decreased function. Left ventricular diastolic parameters are consistent  with Grade I diastolic dysfunction  (impaired relaxation).   3. Right ventricular systolic function is normal. The right ventricular  size is normal. There is normal pulmonary artery systolic pressure.   4. Left atrial size was moderately dilated.   5. The mitral valve is normal in structure. Mild mitral valve  regurgitation.   6. The aortic valve is tricuspid. Aortic valve regurgitation is not  visualized. Aortic valve sclerosis/calcification is present, without any  evidence of aortic stenosis.   7. The inferior vena cava is normal in size with greater than 50%  respiratory variability, suggesting right atrial pressure of 3 mmHg.     Neuro/Psych    GI/Hepatic ,GERD  ,,  Endo/Other  diabetes, Type 2    Renal/GU CRF and Renal InsufficiencyRenal diseaseLab Results      Component                Value               Date                           K                        3.5                 10/10/2024                               CREATININE               1.08                10/10/2024                               Musculoskeletal   Abdominal   Peds  Hematology Lab Results      Component                Value               Date                      WBC                      5.0  10/10/2024                HGB                      7.3 (L)             10/10/2024                HCT                      22.1 (L)            10/10/2024                MCV                      96.9                10/10/2024                PLT                      23 (LL)             10/10/2024              Anesthesia Other Findings All: Allegra  Echo for bacteremia  Reproductive/Obstetrics                              Anesthesia Physical Anesthesia Plan  ASA: 3  Anesthesia Plan: MAC   Post-op Pain Management: Minimal or no pain anticipated   Induction: Intravenous  PONV Risk Score and Plan: 1 and Propofol  infusion and Treatment may vary due to age or medical condition  Airway Management Planned: Natural Airway and Nasal Cannula  Additional Equipment: None  Intra-op Plan:   Post-operative Plan:   Informed Consent: I have reviewed the patients History and Physical, chart, labs and discussed the procedure including the risks, benefits and alternatives for the proposed anesthesia with the patient or authorized representative who has indicated his/her understanding and acceptance.     Dental advisory given  Plan Discussed with: CRNA, Anesthesiologist and Surgeon  Anesthesia Plan Comments:          Anesthesia Quick Evaluation

## 2024-10-11 NOTE — Progress Notes (Signed)
 PHARMACY CONSULT NOTE FOR:  OUTPATIENT  PARENTERAL ANTIBIOTIC THERAPY (OPAT)  Indication: Staph lugdunesis bacteremia + ICD Regimen: Cefazolin  2g IV every 12 hours End date: 11/19/24 (6 weeks from neg BCx 11/17)  IV antibiotic discharge orders are pended. To discharging provider:  please sign these orders via discharge navigator,  Select New Orders & click on the button choice - Manage This Unsigned Work.     Thank you for allowing pharmacy to be a part of this patient's care.  Almarie Lunger, PharmD, BCPS, BCIDP Infectious Diseases Clinical Pharmacist 10/11/2024 2:17 PM   **Pharmacist phone directory can now be found on [email or web address] ([email or web address]).  Listed under [facility].

## 2024-10-11 NOTE — H&P (View-Only) (Signed)
 PROGRESS NOTE [name]  FMW:[id number] DOB: [birth year] DOA: 10/06/2024 PCP: [name], MD  Brief Narrative/Hospital Course: [name] is a 83 y.o. male with PMH of A-fib on Eliquis , AICD in place, CAD chronic systolic CHF CKD stage III, history of renal stones, hypertension, legally blind, peripheral artery disease, stage IIIc/IV NSCLC-adenocarcinoma, currently on reduced dose of chemotherapy S/P 4 cycle carboplatin , Alimta , and Keytruda  which has been discontinued due to intolerance, previous pancytopenia needing G-CSF presented with increasing fatigue, pain above his pacemaker in the right leg. In the ZI:wnuzi to have darker erythema along his lateral lower leg, DVT was negative given his immunocompromise status admitted with right lower extremity cellulitis, DVT study negative, bacteremia with Staph Luggdunesis ID Ep consulted given PM TTE ok, so planned TEE to r/o IE on 11/20 and EP eval at [facility]  Subjective: Seen and examined Aaox3 no complaints Alert awake oriented Denies any new complaints labs shows mild hypokalemia hemoglobin up to 7.8 platelet 33  Assessment and plan:  Right leg cellulitis Bacteremia w/ Staph lugudnesis Immunocompromise status: Admitted with right lower extremity cellulitis, DVT study negative, bacteremia with Staph Luggdunesis ID following repeat blood culture 11/17 NGTD Given pacemaker status EP cardiology following planning for TEE 11/20 and EP eval at [facility]- Cont iv Ancef  based on sensitivity/ per ID.  Pancytopenia due to chemotherapy: oncology following  started on filgastrim 300 mcg daily neutropenia resolved, received 1 unit platelet and 1 unit PRBC. Counts slowly uptrending.  Monitor  PAF: Rate controlled.Eliquis  remains on hold due to severe thrombocytopenia and anemia   CKD 3b Mild metabolic acidosis Mild hypokalemia: Renal function remains stable.  Replace po post TEE  Constipation: Continue bowel  regimen  CAD/NICM Chronic systolic CHF ICD in place: EF 64-59% from echo 04/30/2023 with G1 DD. BP fairly stable. Continue patient's Entresto , Toprol  Imdur  as BP allows- hold Aldactone , Lasix  for now.   Given bacteremia EP has been made aware TTE-no obvious vegetation but cannot completely exclude EF 45-50% G1 DD EP ( Renee) aware for eval at [facility] after TEE  PVD: Monitor  NSCLC stage IIIc/IV, ACC: Initially diagnosed August 2024 received chemoradiation with carboplatin  and paclitaxel  last received consolidation immunotherapy Imfinzi . on systemic treatment with pemetrexed  + pembrolizumab .  Status post cycle 5 on 09/27/2024.  Oncology following closely-now with bacteremia  DVT prophylaxis: Place and maintain sequential compression device Start: 10/07/24 1150 no chemical prophylaxis due to thrombocytopenia Code Status:   Code Status: Full Code Family Communication: plan of care discussed with patient at bedside.  Wife updated at the bedside 11/17 Patient status is: Remains hospitalized because of severity of illness Level of care: Telemetry   Dispo: The patient is from: Home with wife, ambulatory at baseline            Anticipated disposition: TBD.  Planning to transfer to [facility] for TEE and will stay there. Objective: Vitals last 24 hrs: Vitals:   10/10/24 1322 10/10/24 2000 10/11/24 0445 10/11/24 0811  BP: (!) 114/58 136/66 (!) 149/82   Pulse: 74 77 78   Resp: 20 18 18    Temp: 98.4 F (36.9 C) 98.7 F (37.1 C) 98.9 F (37.2 C)   TempSrc: Oral Oral Oral   SpO2: 100% 100% 100% 96%  Weight:      Height:        Physical Examination: General exam: AAOX3 HEENT:Oral mucosa moist, Ear/Nose WNL grossly Respiratory system: CTA Bilaterally Cardiovascular system: S1 & S2 +, No JVD. Gastrointestinal system: Abdomen  soft,NT,ND, BS+ Nervous System: Alert, awake, moving all extremities,and following commands. Extremities: extremities warm, right lower extremity mildly  tender Skin: Warm, no rashes MSK: Normal muscle bulk,tone, power   Medications reviewed:  Scheduled Meds:  sodium chloride    Intravenous Once   acetaminophen   650 mg Oral Once   brimonidine   1 drop Both Eyes BID   cycloSPORINE   1 drop Both Eyes BID   dorzolamide -timolol   1 drop Both Eyes BID   feeding supplement  237 mL Oral BID BM   fluticasone  furoate-vilanterol  1 puff Inhalation q morning   isosorbide  mononitrate  30 mg Oral Daily   Latanoprostene Bunod   1 drop Ophthalmic QHS   metoprolol  succinate  25 mg Oral Daily   Netarsudil -Latanoprost   1 drop Both Eyes QHS   pantoprazole   40 mg Oral Daily   sacubitril -valsartan   1 tablet Oral BID   Continuous Infusions:   ceFAZolin  (ANCEF ) IV 2 g (10/11/24 0604)   Diet: Diet Order             Diet NPO time specified Except for: Sips with Meds  Diet effective midnight                    Data Reviewed: I have personally reviewed following labs and imaging studies ( see epic result tab) CBC: Recent Labs  Lab 10/06/24 1543 10/07/24 0623 10/08/24 0704 10/09/24 0635 10/10/24 0606 10/11/24 0713  WBC 1.3* 1.0* 1.3* 1.9* 5.0 6.9  NEUTROABS 0.9*  --  0.6* 1.3* 4.0 5.3  HGB 7.5* 7.3* 7.5* 6.8* 7.3* 7.8*  HCT 22.5* 22.0* 23.6* 20.6* 22.1* 24.0*  MCV 98.7 97.8 100.0 97.6 96.9 97.6  PLT 26* 24* 16* 22* 23* 33*   CMP: Recent Labs  Lab 10/07/24 0623 10/08/24 0704 10/09/24 0635 10/10/24 0606 10/11/24 0713  NA 139 142 141 142 141  K 3.8 4.1 3.5 3.5 3.3*  CL 111 112* 111 111 111  CO2 20* 20* 20* 20* 23  GLUCOSE 112* 110* 119* 101* 114*  BUN 22 16 16 11 10   CREATININE 1.08 1.05 1.15 1.08 1.21  CALCIUM  8.8* 8.7* 8.8* 8.7* 8.9  MG 1.9  --   --   --   --    GFR: Estimated Creatinine Clearance: 41.7 mL/min (by C-G formula based on SCr of 1.21 mg/dL). Recent Labs  Lab 10/06/24 1432  AST 19  ALT 8  ALKPHOS 43  BILITOT 0.8  PROT 6.5  ALBUMIN  3.2*    Recent Labs  Lab 10/06/24 1432  LIPASE 11   No results for input(s):  AMMONIA in the last 168 hours. Coagulation Profile: No results for input(s): INR, PROTIME in the last 168 hours. Unresulted Labs (From admission, onward)     Start     Ordered   10/08/24 0500  CBC with Differential/Platelet  Daily,   R      10/07/24 1151   10/08/24 0500  Basic metabolic panel with GFR  Daily,   R      10/07/24 1151           Antimicrobials/Microbiology: Anti-infectives (From admission, onward)    Start     Dose/Rate Route Frequency Ordered Stop   10/08/24 1630  ceFAZolin  (ANCEF ) IVPB 2g/100 mL premix        2 g 200 mL/hr over 30 Minutes Intravenous Every 8 hours 10/08/24 1542     10/07/24 1900  vancomycin  (VANCOCIN ) IVPB 1000 mg/200 mL premix  Status:  Discontinued  1,000 mg 200 mL/hr over 60 Minutes Intravenous Every 24 hours 10/06/24 1904 10/08/24 1542   10/06/24 1900  ceFEPIme  (MAXIPIME ) 2 g in sodium chloride  0.9 % 100 mL IVPB  Status:  Discontinued        2 g 200 mL/hr over 30 Minutes Intravenous Every 12 hours 10/06/24 1832 10/08/24 1542   10/06/24 1745  vancomycin  (VANCOCIN ) IVPB 1000 mg/200 mL premix  Status:  Discontinued        1,000 mg 200 mL/hr over 60 Minutes Intravenous  Once 10/06/24 1730 10/06/24 1732   10/06/24 1745  ceFEPIme  (MAXIPIME ) 2 g in sodium chloride  0.9 % 100 mL IVPB  Status:  Discontinued        2 g 200 mL/hr over 30 Minutes Intravenous  Once 10/06/24 1730 10/06/24 1832   10/06/24 1745  vancomycin  (VANCOREADY) IVPB 1500 mg/300 mL        1,500 mg 150 mL/hr over 120 Minutes Intravenous  Once 10/06/24 1732 10/06/24 2130         Component Value Date/Time   SDES  10/08/2024 1718    BLOOD BLOOD RIGHT HAND Performed at [facility], [address] [place], [place] [address]    SPECREQUEST  10/08/2024 1718    BOTTLES DRAWN AEROBIC AND ANAEROBIC Blood Culture adequate volume Performed at [facility], [address] [place], [place] [address]    CULT  10/08/2024 1718    NO GROWTH  3 DAYS Performed at [facility], [address] [place], [place] [address]    REPTSTATUS PENDING 10/08/2024 1718    Procedures: Procedure(s) (LRB): TRANSESOPHAGEAL ECHOCARDIOGRAM (N/A)   Mennie LAMY, MD [facility] 10/11/2024, 10:06 AM

## 2024-10-11 NOTE — Progress Notes (Signed)
 PROGRESS NOTE [name]  FMW:[id number] DOB: [birth year] DOA: 10/06/2024 PCP: [name], MD  Brief Narrative/Hospital Course: [name] is a 83 y.o. male with PMH of A-fib on Eliquis , AICD in place, CAD chronic systolic CHF CKD stage III, history of renal stones, hypertension, legally blind, peripheral artery disease, stage IIIc/IV NSCLC-adenocarcinoma, currently on reduced dose of chemotherapy S/P 4 cycle carboplatin , Alimta , and Keytruda  which has been discontinued due to intolerance, previous pancytopenia needing G-CSF presented with increasing fatigue, pain above his pacemaker in the right leg. In the ZI:wnuzi to have darker erythema along his lateral lower leg, DVT was negative given his immunocompromise status admitted with right lower extremity cellulitis, DVT study negative, bacteremia with Staph Luggdunesis ID Ep consulted given PM TTE ok, so planned TEE to r/o IE on 11/20 and EP eval at [facility]  Subjective: Seen and examined Aaox3 no complaints Alert awake oriented Denies any new complaints labs shows mild hypokalemia hemoglobin up to 7.8 platelet 33  Assessment and plan:  Right leg cellulitis Bacteremia w/ Staph lugudnesis Immunocompromise status: Admitted with right lower extremity cellulitis, DVT study negative, bacteremia with Staph Luggdunesis ID following repeat blood culture 11/17 NGTD Given pacemaker status EP cardiology following planning for TEE 11/20 and EP eval at [facility]- Cont iv Ancef  based on sensitivity/ per ID.  Pancytopenia due to chemotherapy: oncology following  started on filgastrim 300 mcg daily neutropenia resolved, received 1 unit platelet and 1 unit PRBC. Counts slowly uptrending.  Monitor  PAF: Rate controlled.Eliquis  remains on hold due to severe thrombocytopenia and anemia   CKD 3b Mild metabolic acidosis Mild hypokalemia: Renal function remains stable.  Replace po post TEE  Constipation: Continue bowel  regimen  CAD/NICM Chronic systolic CHF ICD in place: EF 64-59% from echo 04/30/2023 with G1 DD. BP fairly stable. Continue patient's Entresto , Toprol  Imdur  as BP allows- hold Aldactone , Lasix  for now.   Given bacteremia EP has been made aware TTE-no obvious vegetation but cannot completely exclude EF 45-50% G1 DD EP ( Renee) aware for eval at [facility] after TEE  PVD: Monitor  NSCLC stage IIIc/IV, ACC: Initially diagnosed August 2024 received chemoradiation with carboplatin  and paclitaxel  last received consolidation immunotherapy Imfinzi . on systemic treatment with pemetrexed  + pembrolizumab .  Status post cycle 5 on 09/27/2024.  Oncology following closely-now with bacteremia  DVT prophylaxis: Place and maintain sequential compression device Start: 10/07/24 1150 no chemical prophylaxis due to thrombocytopenia Code Status:   Code Status: Full Code Family Communication: plan of care discussed with patient at bedside.  Wife updated at the bedside 11/17 Patient status is: Remains hospitalized because of severity of illness Level of care: Telemetry   Dispo: The patient is from: Home with wife, ambulatory at baseline            Anticipated disposition: TBD.  Planning to transfer to [facility] for TEE and will stay there. Objective: Vitals last 24 hrs: Vitals:   10/10/24 1322 10/10/24 2000 10/11/24 0445 10/11/24 0811  BP: (!) 114/58 136/66 (!) 149/82   Pulse: 74 77 78   Resp: 20 18 18    Temp: 98.4 F (36.9 C) 98.7 F (37.1 C) 98.9 F (37.2 C)   TempSrc: Oral Oral Oral   SpO2: 100% 100% 100% 96%  Weight:      Height:        Physical Examination: General exam: AAOX3 HEENT:Oral mucosa moist, Ear/Nose WNL grossly Respiratory system: CTA Bilaterally Cardiovascular system: S1 & S2 +, No JVD. Gastrointestinal system: Abdomen  soft,NT,ND, BS+ Nervous System: Alert, awake, moving all extremities,and following commands. Extremities: extremities warm, right lower extremity mildly  tender Skin: Warm, no rashes MSK: Normal muscle bulk,tone, power   Medications reviewed:  Scheduled Meds:  sodium chloride    Intravenous Once   acetaminophen   650 mg Oral Once   brimonidine   1 drop Both Eyes BID   cycloSPORINE   1 drop Both Eyes BID   dorzolamide -timolol   1 drop Both Eyes BID   feeding supplement  237 mL Oral BID BM   fluticasone  furoate-vilanterol  1 puff Inhalation q morning   isosorbide  mononitrate  30 mg Oral Daily   Latanoprostene Bunod   1 drop Ophthalmic QHS   metoprolol  succinate  25 mg Oral Daily   Netarsudil -Latanoprost   1 drop Both Eyes QHS   pantoprazole   40 mg Oral Daily   sacubitril -valsartan   1 tablet Oral BID   Continuous Infusions:   ceFAZolin  (ANCEF ) IV 2 g (10/11/24 0604)   Diet: Diet Order             Diet NPO time specified Except for: Sips with Meds  Diet effective midnight                    Data Reviewed: I have personally reviewed following labs and imaging studies ( see epic result tab) CBC: Recent Labs  Lab 10/06/24 1543 10/07/24 0623 10/08/24 0704 10/09/24 0635 10/10/24 0606 10/11/24 0713  WBC 1.3* 1.0* 1.3* 1.9* 5.0 6.9  NEUTROABS 0.9*  --  0.6* 1.3* 4.0 5.3  HGB 7.5* 7.3* 7.5* 6.8* 7.3* 7.8*  HCT 22.5* 22.0* 23.6* 20.6* 22.1* 24.0*  MCV 98.7 97.8 100.0 97.6 96.9 97.6  PLT 26* 24* 16* 22* 23* 33*   CMP: Recent Labs  Lab 10/07/24 0623 10/08/24 0704 10/09/24 0635 10/10/24 0606 10/11/24 0713  NA 139 142 141 142 141  K 3.8 4.1 3.5 3.5 3.3*  CL 111 112* 111 111 111  CO2 20* 20* 20* 20* 23  GLUCOSE 112* 110* 119* 101* 114*  BUN 22 16 16 11 10   CREATININE 1.08 1.05 1.15 1.08 1.21  CALCIUM  8.8* 8.7* 8.8* 8.7* 8.9  MG 1.9  --   --   --   --    GFR: Estimated Creatinine Clearance: 41.7 mL/min (by C-G formula based on SCr of 1.21 mg/dL). Recent Labs  Lab 10/06/24 1432  AST 19  ALT 8  ALKPHOS 43  BILITOT 0.8  PROT 6.5  ALBUMIN  3.2*    Recent Labs  Lab 10/06/24 1432  LIPASE 11   No results for input(s):  AMMONIA in the last 168 hours. Coagulation Profile: No results for input(s): INR, PROTIME in the last 168 hours. Unresulted Labs (From admission, onward)     Start     Ordered   10/08/24 0500  CBC with Differential/Platelet  Daily,   R      10/07/24 1151   10/08/24 0500  Basic metabolic panel with GFR  Daily,   R      10/07/24 1151           Antimicrobials/Microbiology: Anti-infectives (From admission, onward)    Start     Dose/Rate Route Frequency Ordered Stop   10/08/24 1630  ceFAZolin  (ANCEF ) IVPB 2g/100 mL premix        2 g 200 mL/hr over 30 Minutes Intravenous Every 8 hours 10/08/24 1542     10/07/24 1900  vancomycin  (VANCOCIN ) IVPB 1000 mg/200 mL premix  Status:  Discontinued  1,000 mg 200 mL/hr over 60 Minutes Intravenous Every 24 hours 10/06/24 1904 10/08/24 1542   10/06/24 1900  ceFEPIme (MAXIPIME) 2 g in sodium chloride  0.9 % 100 mL IVPB  Status:  Discontinued        2 g 200 mL/hr over 30 Minutes Intravenous Every 12 hours 10/06/24 1832 10/08/24 1542   10/06/24 1745  vancomycin (VANCOCIN) IVPB 1000 mg/200 mL premix  Status:  Discontinued        1,000 mg 200 mL/hr over 60 Minutes Intravenous  Once 10/06/24 1730 10/06/24 1732   10/06/24 1745  ceFEPIme (MAXIPIME) 2 g in sodium chloride  0.9 % 100 mL IVPB  Status:  Discontinued        2 g 200 mL/hr over 30 Minutes Intravenous  Once 10/06/24 1730 10/06/24 1832   10/06/24 1745  vancomycin (VANCOREADY) IVPB 1500 mg/300 mL        1,500 mg 150 mL/hr over 120 Minutes Intravenous  Once 10/06/24 1732 10/06/24 2130         Component Value Date/Time   SDES  10/08/2024 1718    BLOOD BLOOD RIGHT HAND Performed at [facility], [address] [place], [place] [address]    SPECREQUEST  10/08/2024 1718    BOTTLES DRAWN AEROBIC AND ANAEROBIC Blood Culture adequate volume Performed at [facility], [address] [place], [place] [address]    CULT  10/08/2024 1718    NO GROWTH  3 DAYS Performed at [facility], [address] [place], [place] [address]    REPTSTATUS PENDING 10/08/2024 1718    Procedures: Procedure(s) (LRB): TRANSESOPHAGEAL ECHOCARDIOGRAM (N/A)   Mennie LAMY, MD [facility] 10/11/2024, 10:06 AM

## 2024-10-11 NOTE — Plan of Care (Addendum)
 Patient transferred to [facility] cone for TEE. Report given to Jon [facility]), pt transferred in stable condition.  Medical necessity form, blood consent form, and care link transfer report printed. Patient left in stable condition. IV remained patent, flushed, and saline locked. Awaiting for transfer bed availability to call report.  Call back # [phone] Lennart HERO. RN  1300: No vegetation shown on TEE, pt arrived to unit safely.  Problem: Education: Goal: Knowledge of General Education information will improve Description: Including pain rating scale, medication(s)/side effects and non-pharmacologic comfort measures Outcome: Progressing   Problem: Health Behavior/Discharge Planning: Goal: Ability to manage health-related needs will improve Outcome: Progressing   Problem: Clinical Measurements: Goal: Ability to maintain clinical measurements within normal limits will improve Outcome: Progressing Goal: Will remain free from infection Outcome: Progressing Goal: Diagnostic test results will improve Outcome: Progressing Goal: Respiratory complications will improve Outcome: Progressing Goal: Cardiovascular complication will be avoided Outcome: Progressing   Problem: Activity: Goal: Risk for activity intolerance will decrease Outcome: Progressing   Problem: Nutrition: Goal: Adequate nutrition will be maintained Outcome: Progressing   Problem: Coping: Goal: Level of anxiety will decrease Outcome: Progressing   Problem: Elimination: Goal: Will not experience complications related to bowel motility Outcome: Progressing Goal: Will not experience complications related to urinary retention Outcome: Progressing   Problem: Pain Managment: Goal: General experience of comfort will improve and/or be controlled Outcome: Progressing   Problem: Safety: Goal: Ability to remain free from injury will improve Outcome: Progressing   Problem: Skin Integrity: Goal: Risk for impaired skin  integrity will decrease Outcome: Progressing   Problem: Clinical Measurements: Goal: Ability to avoid or minimize complications of infection will improve Outcome: Progressing   Problem: Skin Integrity: Goal: Skin integrity will improve Outcome: Progressing

## 2024-10-11 NOTE — Progress Notes (Signed)
 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  Patient was placed on vancomycin  and cefepime  found to have #Staph lugdunensis bacteremia likely secondary to #2 in the setting of ICD #ICD #Right lower extremities cellulitis #Pancytopenia - Patient stated ICC site is itchy as times.  No erythema noted. - Blood cultures grew 1 out of 4 staph lugdunensis, BC ID also showed staph epi.  Suspect staph epi 1 out of 4 bottles is contaminant -Oncology on board given chemotherapy induced pancytopenia patient on Zarxio  #Stage IV lung cancer - Patient on on systemic treatments with pemetrexed , cycle 5 on 09/27/2024.  Followed by Dr. Sherrod  Recommendations: -Continue cefazolin  - Unclear if patient had true bacteremia Staph lugdunensis given staph epi was present as well.  In addition clinical presentation could be secondary cellulitis given low-grade fever.  No vegetation seen on TEE.  Seen by ep not a extraction candidate as such will plan on 6 weeks of IV cefazolin  with PICC line from negative blood cultures EOT 12/29 then suppress with cefadroxil.  Monitor closely off antibiotics with blood cultures - Follow-up with infectious disease outpatient -ID will SO   OPAT ORDERS:  Diagnosis:  Stpah lugdunensis bacteremia in the setting of lCD  Allergies  Allergen Reactions   Paclitaxel  Other (See Comments) and Hypertension    Pt reported chest pain, back pain, and SOB during first infusion (see notes from 9/23). Infusion discontinued.    Allegra [Fexofenadine] Other (See Comments)    Makes him dizzy and feels like he will fall.   Sonafine [Wound Dressings] Itching    Itching and burning to skin  08/25/23     Discharge antibiotics to be given via PICC line:  Per pharmacy protocol    Duration: 6 weeks End Date: 12/29  [facility] Per Protocol with [employer] Use: Home health RN for IV administration and teaching, line care and labs.    Labs weekly while on IV antibiotics: _x_ CBC with differential __ BMP **TWICE WEEKLY ON VANCOMYCIN   x__ CMP _x_ CRP _x_ ESR __ Vancomycin  trough TWICE WEEKLY __ CK  __ Please pull PIC at completion of IV antibiotics __ Please leave PIC in place until doctor has seen patient or been notified  Fax weekly labs to [phone]  Clinic Follow Up Appt: 12/11  @ [facility] with Dennise    Microbiology:   Antibiotics: 11/15- vancomycin   Cefepime  11/15-11/16 Cefazolin  11/17-   Cultures: Blood 11/15 1/4 bottles staph lugdeneinsis and stph epi 11/17 ng  SUBJECTIVE: Resting in bed. Interval: Afebrile overnight.  Review of Systems: Review of Systems  All other systems reviewed and are negative.    Scheduled Meds:  sodium chloride    Intravenous Once   acetaminophen   650 mg Oral Once   brimonidine   1  drop Both Eyes BID   cycloSPORINE   1 drop Both Eyes BID   dorzolamide -timolol   1 drop Both Eyes BID   feeding supplement  237 mL Oral BID BM   fluticasone  furoate-vilanterol  1 puff Inhalation q morning   isosorbide  mononitrate  30 mg Oral Daily   Latanoprostene Bunod   1 drop Ophthalmic QHS   metoprolol  succinate  25 mg Oral Daily   Netarsudil -Latanoprost   1 drop Both Eyes QHS   pantoprazole   40 mg Oral Daily    sacubitril -valsartan   1 tablet Oral BID   Continuous Infusions:   ceFAZolin  (ANCEF ) IV     potassium chloride      PRN Meds:.acetaminophen  **OR** acetaminophen , bisacodyl , ondansetron  **OR** ondansetron  (ZOFRAN ) IV, mouth rinse Allergies  Allergen Reactions   Paclitaxel  Other (See Comments) and Hypertension    Pt reported chest pain, back pain, and SOB during first infusion (see notes from 9/23). Infusion discontinued.    Allegra [Fexofenadine] Other (See Comments)    Makes him dizzy and feels like he will fall.   Sonafine [Wound Dressings] Itching    Itching and burning to skin  08/25/23    OBJECTIVE: Vitals:   10/11/24 1210 10/11/24 1220 10/11/24 1230 10/11/24 1339  BP: 120/67 123/68 125/69 135/86  Pulse: 77   74  Resp: 19 17 17 20   Temp:    97.7 F (36.5 C)  TempSrc:    Oral  SpO2: 99%   100%  Weight:      Height:       Body mass index is 23.34 kg/m.  Physical Exam Constitutional:      General: He is not in acute distress.    Appearance: He is normal weight. He is not toxic-appearing.  HENT:     Head: Normocephalic and atraumatic.     Right Ear: External ear normal.     Left Ear: External ear normal.     Nose: No congestion or rhinorrhea.     Mouth/Throat:     Mouth: Mucous membranes are moist.     Pharynx: Oropharynx is clear.  Eyes:     Extraocular Movements: Extraocular movements intact.     Conjunctiva/sclera: Conjunctivae normal.     Pupils: Pupils are equal, round, and reactive to light.  Cardiovascular:     Rate and Rhythm: Normal rate and regular rhythm.     Heart sounds: No murmur heard.    No friction rub. No gallop.  Pulmonary:     Effort: Pulmonary effort is normal.     Breath sounds: Normal breath sounds.  Abdominal:     General: Abdomen is flat. Bowel sounds are normal.     Palpations: Abdomen is soft.  Musculoskeletal:        General: No swelling. Normal range of motion.     Cervical back: Normal range of motion and neck supple.  Skin:     General: Skin is warm and dry.  Neurological:     General: No focal deficit present.     Mental Status: He is oriented to person, place, and time.  Psychiatric:        Mood and Affect: Mood normal.       Lab Results Lab Results  Component Value Date   WBC 6.9 10/11/2024   HGB 7.8 (L) 10/11/2024   HCT 24.0 (L) 10/11/2024   MCV 97.6 10/11/2024   PLT 33 (L) 10/11/2024    Lab Results  Component Value Date   CREATININE 1.21 10/11/2024   BUN 10 10/11/2024  NA 141 10/11/2024   K 3.3 (L) 10/11/2024   CL 111 10/11/2024   CO2 23 10/11/2024    Lab Results  Component Value Date   ALT 8 10/06/2024   AST 19 10/06/2024   ALKPHOS 43 10/06/2024   BILITOT 0.8 10/06/2024        Loney Stank, MD [facility] 10/11/2024, 3:49 PM Evaluation of this patient requires complex antimicrobial therapy evaluation and counseling + isolation needs for disease transmission risk assessment and mitigation

## 2024-10-12 DIAGNOSIS — B958 Unspecified staphylococcus as the cause of diseases classified elsewhere: Secondary | ICD-10-CM

## 2024-10-12 DIAGNOSIS — R7881 Bacteremia: Secondary | ICD-10-CM | POA: Diagnosis not present

## 2024-10-12 LAB — BASIC METABOLIC PANEL WITH GFR
Anion gap: 9 (ref 5–15)
BUN: 9 mg/dL (ref 8–23)
CO2: 22 mmol/L (ref 22–32)
Calcium: 8.9 mg/dL (ref 8.9–10.3)
Chloride: 111 mmol/L (ref 98–111)
Creatinine, Ser: 0.95 mg/dL (ref 0.61–1.24)
GFR, Estimated: >60 mL/min (ref >60)
Glucose, Bld: 115 mg/dL — ABNORMAL HIGH (ref 70–99)
Potassium: 3.1 mmol/L — ABNORMAL LOW (ref 3.5–5.1)
Sodium: 142 mmol/L (ref 135–145)

## 2024-10-12 LAB — CBC WITH DIFFERENTIAL/PLATELET
Abs Immature Granulocytes: 0.41 K/uL — ABNORMAL HIGH (ref 0.00–0.07)
Basophils Absolute: 0 K/uL (ref 0.0–0.1)
Basophils Relative: 0 %
Eosinophils Absolute: 0.2 K/uL (ref 0.0–0.5)
Eosinophils Relative: 3 %
HCT: 25.6 % — ABNORMAL LOW (ref 39.0–52.0)
Hemoglobin: 8.2 g/dL — ABNORMAL LOW (ref 13.0–17.0)
Immature Granulocytes: 7 %
Lymphocytes Relative: 7 %
Lymphs Abs: 0.5 K/uL — ABNORMAL LOW (ref 0.7–4.0)
MCH: 31.5 pg (ref 26.0–34.0)
MCHC: 32 g/dL (ref 30.0–36.0)
MCV: 98.5 fL (ref 80.0–100.0)
Monocytes Absolute: 0.7 K/uL (ref 0.1–1.0)
Monocytes Relative: 12 %
Neutro Abs: 4.5 K/uL (ref 1.7–7.7)
Neutrophils Relative %: 71 %
Platelets: 45 K/uL — ABNORMAL LOW (ref 150–400)
RBC: 2.6 MIL/uL — ABNORMAL LOW (ref 4.22–5.81)
RDW: 18.2 % — ABNORMAL HIGH (ref 11.5–15.5)
WBC: 6.3 K/uL (ref 4.0–10.5)
nRBC: 0.3 % — ABNORMAL HIGH (ref 0.0–0.2)

## 2024-10-12 MED ORDER — POTASSIUM CHLORIDE 10 MEQ/100ML IV SOLN
10.0000 meq | INTRAVENOUS | Status: DC
  Administered 2024-10-12: 10 meq via INTRAVENOUS
  Filled 2024-10-12 (×2): qty 100

## 2024-10-12 MED ORDER — CHLORHEXIDINE GLUCONATE CLOTH 2 % EX PADS: 6.0000 | MEDICATED_PAD | Freq: Every day | CUTANEOUS | Status: DC

## 2024-10-12 MED ORDER — POTASSIUM CHLORIDE CRYS ER 20 MEQ PO TBCR
40.0000 meq | EXTENDED_RELEASE_TABLET | ORAL | Status: AC
Start: 2024-10-12 — End: 2024-10-12
  Administered 2024-10-12 (×2): 40 meq via ORAL
  Filled 2024-10-12 (×2): qty 2

## 2024-10-12 MED ORDER — CEFAZOLIN SODIUM-DEXTROSE 2-4 GM/100ML-% IV SOLN
2.0000 g | Freq: Two times a day (BID) | INTRAVENOUS | Status: DC
  Administered 2024-10-12: 2 g via INTRAVENOUS
  Filled 2024-10-12: qty 100

## 2024-10-12 MED ORDER — SODIUM CHLORIDE 0.9% FLUSH: 10.0000 mL | Freq: Two times a day (BID) | INTRAVENOUS | Status: DC

## 2024-10-12 MED ORDER — SODIUM CHLORIDE 0.9% FLUSH: 10.0000 mL | INTRAVENOUS | Status: DC | PRN

## 2024-10-12 MED ORDER — CEFAZOLIN IV (FOR PTA / DISCHARGE USE ONLY): 2.0000 g | Freq: Two times a day (BID) | INTRAVENOUS | 0 refills | Status: AC

## 2024-10-12 NOTE — Progress Notes (Signed)
 Peripherally Inserted Central Catheter Placement  The IV Nurse has discussed with the patient and/or persons authorized to consent for the patient, the purpose of this procedure and the potential benefits and risks involved with this procedure.  The benefits include less needle sticks, lab draws from the catheter, and the patient may be discharged home with the catheter. Risks include, but not limited to, infection, bleeding, blood clot (thrombus formation), and puncture of an artery; nerve damage and irregular heartbeat and possibility to perform a PICC exchange if needed/ordered by physician.  Alternatives to this procedure were also discussed.  Bard Power PICC patient education guide, fact sheet on infection prevention and patient information card has been provided to patient /or left at bedside.    PICC Placement Documentation  PICC Single Lumen 10/12/24 Right Basilic 39 cm 0 cm (Active)  Indication for Insertion or Continuance of Line Prolonged intravenous therapies 10/12/24 1551  Exposed Catheter (cm) 0 cm 10/12/24 1551  Site Assessment Clean, Dry, Intact 10/12/24 1551  Line Status Flushed;Saline locked;Blood return noted 10/12/24 1551  Dressing Type Transparent;Securing device 10/12/24 1551  Dressing Status Antimicrobial disc/dressing in place 10/12/24 1551  Line Care Connections checked and tightened 10/12/24 1551  Line Adjustment (NICU/IV Team Only) No 10/12/24 1551  Dressing Intervention New dressing;Adhesive placed at insertion site (IV team only) 10/12/24 1551  Dressing Change Due 10/19/24 10/12/24 1551       [name] 10/12/2024, 3:56 PM

## 2024-10-12 NOTE — Plan of Care (Signed)
  Problem: Health Behavior/Discharge Planning: Goal: Ability to manage health-related needs will improve Outcome: Progressing   Problem: Activity: Goal: Risk for activity intolerance will decrease Outcome: Progressing   Problem: Pain Managment: Goal: General experience of comfort will improve and/or be controlled Outcome: Progressing   Problem: Clinical Measurements: Goal: Ability to avoid or minimize complications of infection will improve Outcome: Progressing

## 2024-10-12 NOTE — TOC Progression Note (Addendum)
 Transition of Care [facility]) - Progression Note    Patient Details  Name: [name] MRN: [id number] Date of Birth: 01-14-1941  Transition of Care [facility]) CM/SW Contact  Toy LITTIE Agar, RN Phone Number:[phone]  10/12/2024, 4:07 PM  Clinical Narrative:    [name] is the only agency to offer [facility] services via [facility]. CM updated patient and patient is agreeable .  Home health has been set up with [facility]. Home IV infusion has been set up with [facility]. No other inpatient care manager needs noted.                       Expected Discharge Plan and Services                                               Social Drivers of Health (SDOH) Interventions SDOH Screenings   Food Insecurity: No Food Insecurity (10/06/2024)  Housing: Low Risk  (10/06/2024)  Transportation Needs: No Transportation Needs (10/06/2024)  Utilities: Not At Risk (10/06/2024)  Alcohol Screen: Low Risk  (08/04/2023)  Depression (PHQ2-9): Low Risk  (09/22/2024)  Social Connections: Moderately Integrated (10/06/2024)  Tobacco Use: Medium Risk (10/06/2024)    Readmission Risk Interventions    10/09/2024    9:20 AM  Readmission Risk Prevention Plan  Transportation Screening Complete  PCP or Specialist Appt within 5-7 Days Complete  Home Care Screening Complete  Medication Review (RN CM) Complete

## 2024-10-12 NOTE — Progress Notes (Signed)
 Discharge instructions given to wife Calton, and niece present at bedside. Patient and wife voiced understanding. No complaints or concerns noted at this time.

## 2024-10-12 NOTE — Discharge Summary (Signed)
 Physician Discharge Summary  [name] FMW:[id number] DOB: [birth year] DOA: 10/06/2024  PCP: [name], MD  Admit date: 10/06/2024 Discharge date: 10/12/2024 Recommendations for Outpatient Follow-up:  Follow up with PCP in 1 weeks-call for appointment Please obtain BMP/CBC in one week  Discharge Dispo: Home w/ [facility] Discharge Condition: Stable Code Status:   Code Status: Full Code Diet recommendation:  Diet Order             Diet - low sodium heart healthy           Diet Heart Room service appropriate? Yes; Fluid consistency: Thin  Diet effective now                    Brief/Interim Summary: [name] is a 83 y.o. male with PMH of A-fib on Eliquis , AICD in place, CAD chronic systolic CHF CKD stage III, history of renal stones, hypertension, legally blind, peripheral artery disease, stage IIIc/IV NSCLC-adenocarcinoma, currently on reduced dose of chemotherapy S/P 4 cycle carboplatin , Alimta , and Keytruda  which has been discontinued due to intolerance, previous pancytopenia needing G-CSF presented with increasing fatigue, pain above his pacemaker in the right leg. In the ZI:wnuzi to have darker erythema along his lateral lower leg, DVT was negative given his immunocompromise status admitted with right lower extremity cellulitis, DVT study negative, bacteremia with Staph Luggdunesis ID Ep consulted given PM TTE ok,s/p TEE no vegetation Plan for PICC line and 6 wk of ABX  Subjective: Seen and examined Waiting for PICC Overnight afebrile BP stable on room air Labs this morning with mild hypokalemia 3.1 hemoglobin improved to 8.2 WBC 6.3 platelet 45  Discharge Diagnoses:   Right leg cellulitis Bacteremia w/ Staph lugudnesis Immunocompromise status: Admitted with right lower extremity cellulitis, DVT study negative, bacteremia with Staph Luggdunesis ID following repeat blood culture 11/17 NGTD Given pacemaker status EP cardiology s/p TEE 11/20-no vegetation EP has  evaluated patient and at this point plan for 6 weeks of IV Ancef  EOT 12/29 ID will arrange outpatient follow-up   Pancytopenia due to chemotherapy: S/p filgastrim and transfusion of platelet and PRBC.  Labs trending up.  Follow-up outpatient  Eliquis  has been on hold and discussed w/ [facility] ND Dr Gatha who will resume outpatient after eval next week due to thrombocytopenia  PAF: Rate controlled.Eliquis  remains on hold due to severe thrombocytopenia and anemia -no need instruction from oncology to resume  CKD 3b Mild metabolic acidosis Mild hypokalemia: Renal function remains stable. Replacing k.  Constipation: Continue bowel regimen  CAD/NICM Chronic systolic CHF ICD in place: EF 64-59% from echo 04/30/2023 with G1 DD. BP fairly stable. Continue patient's Entresto , Toprol  Imdur  and rest of meds on d/c Given bacteremia EP has been made aware TTE-no obvious vegetation but cannot completely exclude EF 45-50% G1 DD Tee done  PVD: Monitor  NSCLC stage IIIc/IV, ACC: Initially diagnosed August 2024 received chemoradiation with carboplatin  and paclitaxel  last received consolidation immunotherapy Imfinzi . on systemic treatment with pemetrexed  + pembrolizumab .  Status post cycle 5 on 09/27/2024.   [name] is arranging outpatient follow-up  DVT prophylaxis: Place and maintain sequential compression device Start: 10/07/24 1150 no chemical prophylaxis due to thrombocytopenia Code Status:   Code Status: Full Code Family Communication: plan of care discussed with patient at bedside.  Wife updated Patient status is: Remains hospitalized because of severity of illness Level of care: Telemetry   Dispo: The patient is from: Home with wife, ambulatory at baseline  Anticipated disposition: home w/ picc line today or tomorrow Objective: Vitals last 24 hrs: Vitals:   10/11/24 2046 10/12/24 0511 10/12/24 0840 10/12/24 1311  BP: 138/75 116/71  103/67  Pulse: 76 100  73  Resp: 18 18  16    Temp: 98.8 F (37.1 C) 98.6 F (37 C)  98.4 F (36.9 C)  TempSrc: Oral Oral  Oral  SpO2: 100% 100% 95% 100%  Weight:      Height:        Physical Examination: General exam: AAOX3 HEENT:Oral mucosa moist, Ear/Nose WNL grossly Respiratory system: CTA Bilaterally Cardiovascular system: S1 & S2 +, No JVD. Gastrointestinal system: Abdomen soft,NT,ND, BS+ Nervous System: Alert, awake, moving all extremities,and following commands. Extremities: extremities warm, right lower extremity mildly tender Skin: Warm, no rashes MSK: Normal muscle bulk,tone, power      Procedure(s) (LRB): TRANSESOPHAGEAL ECHOCARDIOGRAM (N/A)  Consultation: See note.  Discharge Instructions  Discharge Instructions     [facility] pharmacist to adjust dose for Vancomycin , Aminoglycosides and other anti-infective therapies as requested by physician.   Complete by: As directed    [facility] to provide Cath Flo 2mg    Complete by: As directed    Administer for PICC line occlusion and as ordered by physician for other access device issues.   Anaphylaxis Kit: Provided to treat any anaphylactic reaction to the medication being provided to the patient if First Dose or when requested by physician   Complete by: As directed    Epinephrine 1mg /ml vial / amp: Administer 0.3mg  (0.37ml) subcutaneously once for moderate to severe anaphylaxis, nurse to call physician and pharmacy when reaction occurs and call 911 if needed for immediate care   Diphenhydramine  50mg /ml IV vial: Administer 25-50mg  IV/IM PRN for first dose reaction, rash, itching, mild reaction, nurse to call physician and pharmacy when reaction occurs   Sodium Chloride  0.9% NS 500ml IV: Administer if needed for hypovolemic blood pressure drop or as ordered by physician after call to physician with anaphylactic reaction   Change dressing on IV access line weekly and PRN   Complete by: As directed    Diet - low sodium heart healthy    Complete by: As directed    Discharge instructions   Complete by: As directed    Please call call MD or return to ER for similar or worsening recurring problem that brought you to hospital or if any fever,nausea/vomiting,abdominal pain, uncontrolled pain, chest pain,  shortness of breath or any other alarming symptoms.  Please follow-up your doctor as instructed in a week time and call the office for appointment.  Please avoid alcohol, smoking, or any other illicit substance and maintain healthy habits including taking your regular medications as prescribed.  You were cared for by a hospitalist during your hospital stay. If you have any questions about your discharge medications or the care you received while you were in the hospital after you are discharged, you can call the unit and ask to speak with the hospitalist on call if the hospitalist that took care of you is not available.  Once you are discharged, your primary care physician will handle any further medical issues. Please note that NO REFILLS for any discharge medications will be authorized once you are discharged, as it is imperative that you return to your primary care physician (or establish a relationship with a primary care physician if you do not have one) for your aftercare needs so that they can reassess your need for medications and  monitor your lab values   Flush IV access with Sodium Chloride  0.9% and Heparin  10 units/ml or 100 units/ml   Complete by: As directed    Home infusion instructions - [facility]   Complete by: As directed    Instructions: Flush IV access with Sodium Chloride  0.9% and Heparin  10units/ml or 100units/ml   Change dressing on IV access line: Weekly and PRN   Instructions Cath Flo 2mg : Administer for PICC Line occlusion and as ordered by physician for other access device   [facility] pharmacist to adjust dose for: Vancomycin , Aminoglycosides and other anti-infective therapies as  requested by physician   Increase activity slowly   Complete by: As directed    Method of administration may be changed at the discretion of home infusion pharmacist based upon assessment of the patient and/or caregiver's ability to self-administer the medication ordered   Complete by: As directed    No wound care   Complete by: As directed       Allergies as of 10/12/2024       Reactions   Paclitaxel  Other (See Comments), Hypertension   Pt reported chest pain, back pain, and SOB during first infusion (see notes from 9/23). Infusion discontinued.    Allegra [fexofenadine] Other (See Comments)   Makes him dizzy and feels like he will fall.   Sonafine [wound Dressings] Itching   Itching and burning to skin  08/25/23        Medication List     PAUSE taking these medications    apixaban  2.5 MG Tabs tablet Wait to take this until your doctor or other care provider tells you to start again. Commonly known as: Eliquis  Take 1 tablet (2.5 mg total) by mouth 2 (two) times daily.       STOP taking these medications    doxycycline  100 MG capsule Commonly known as: VIBRAMYCIN        TAKE these medications    Breo Ellipta  100-25 MCG/INH Aepb Generic drug: fluticasone  furoate-vilanterol Inhale 1 puff into the lungs every morning.   brimonidine  0.2 % ophthalmic solution Commonly known as: ALPHAGAN  Place 1 drop into both eyes 2 (two) times daily.   ceFAZolin  IVPB Commonly known as: ANCEF  Inject 2 g into the vein every 12 (twelve) hours. Indication:  Staph lugdunesis bacteremia + ICD First Dose: Yes Last Day of Therapy:  11/20/23 Labs - Once weekly:  CBC/D and BMP, Labs - Once weekly: ESR and CRP Method of administration: IV Push Method of administration may be changed at the discretion of home infusion pharmacist based upon assessment of the patient and/or caregiver's ability to self-administer the medication ordered.   cycloSPORINE  0.05 % ophthalmic emulsion Commonly  known as: RESTASIS  Place 1 drop into both eyes 2 (two) times daily.   dorzolamide -timolol  2-0.5 % ophthalmic solution Commonly known as: COSOPT  Place 1 drop into both eyes 2 (two) times daily.   Entresto  24-26 MG Generic drug: sacubitril -valsartan  TAKE 1 TABLET BY MOUTH TWICE DAILY   folic acid  1 MG tablet Commonly known as: FOLVITE  Take 1 tablet (1 mg total) by mouth daily. Start 7 days before pemetrexed  chemotherapy. Continue until 21 days after pemetrexed  completed.   FreeStyle Libre 3 Plus Sensor Misc SMARTSIG:Topical Every 3 Months   furosemide  20 MG tablet Commonly known as: LASIX  Take 0.5 tablets (10 mg total) by mouth daily.   Glucagon  Emergency 1 MG Kit Inject 1 mg into the skin as needed for up to 2 doses (Severe low blood  sugar).   hydrocortisone  1 % lotion Apply 1 Application topically 2 (two) times daily.   insulin  isophane & regular human KwikPen (70-30) 100 UNIT/ML KwikPen Commonly known as: HUMULIN 70/30 MIX Inject 25 Units into the skin in the morning and at bedtime.   isosorbide  mononitrate 30 MG 24 hr tablet Commonly known as: IMDUR  Take 30 mg by mouth daily.   lidocaine  5 % Commonly known as: LIDODERM  Place 1 patch onto the skin daily. Remove & Discard patch within 12 hours or as directed by MD   metoprolol  succinate 25 MG 24 hr tablet Commonly known as: TOPROL -XL Take 1 tablet (25 mg total) by mouth daily.   omeprazole  20 MG capsule Commonly known as: PRILOSEC Take 1 capsule (20 mg total) by mouth daily.   ondansetron  8 MG tablet Commonly known as: Zofran  Take 1 tablet (8 mg total) by mouth every 8 (eight) hours as needed for nausea or vomiting. Start on the third day after carboplatin .   Rhopressa 0.02 % Soln Generic drug: Netarsudil  Dimesylate Apply 1 drop to eye every evening.   Rocklatan  0.02-0.005 % Soln Generic drug: Netarsudil -Latanoprost  Place 1 drop into both eyes at bedtime.   spironolactone  25 MG tablet Commonly known as:  ALDACTONE  Take 0.5 tablets (12.5 mg total) by mouth daily.   Vyzulta  0.024 % Soln Generic drug: Latanoprostene Bunod  Apply 1 drop to eye at bedtime.               Discharge Care Instructions  (From admission, onward)           Start     Ordered   10/12/24 0000  Change dressing on IV access line weekly and PRN  (Home infusion instructions - [facility] )        10/12/24 1104            Contact information for follow-up providers     [name], MD Follow up in 1 week(s).   Specialty: Family Medicine Contact information: [address] [place] [address] [phone]         Sherrod Sherrod, MD Follow up in 1 week(s).   Specialty: Oncology Contact information: [address] [place] [address] [phone]              Contact information for after-discharge care     Home Medical Care     [facility]) Follow up.   Service: Home Health Services Why: Your home health has been set up with [facility]. The office will call you with start of care information. Contact information: [address] [place]  [address] [phone]                    Allergies  Allergen Reactions   Paclitaxel  Other (See Comments) and Hypertension    Pt reported chest pain, back pain, and SOB during first infusion (see notes from 9/23). Infusion discontinued.    Allegra [Fexofenadine] Other (See Comments)    Makes him dizzy and feels like he will fall.   Sonafine [Wound Dressings] Itching    Itching and burning to skin  08/25/23    The results of significant diagnostics from this hospitalization (including imaging, microbiology, ancillary and laboratory) are listed below for reference.    Microbiology: Recent Results (from the past 240 hours)  Resp panel by RT-PCR (RSV, Flu A&B, Covid) Anterior Nasal Swab     Status: None   Collection Time: 10/06/24  1:42 PM   Specimen:  Anterior Nasal Swab  Result Value Ref Range Status   SARS Coronavirus 2 by RT PCR NEGATIVE NEGATIVE Final    Comment: (NOTE) SARS-CoV-2 target nucleic acids are NOT DETECTED.  The SARS-CoV-2 RNA is generally detectable in upper respiratory specimens during the acute phase of infection. The lowest concentration of SARS-CoV-2 viral copies this assay can detect is 138 copies/mL. A negative result does not preclude SARS-Cov-2 infection and should not be used as the sole basis for treatment or other patient management decisions. A negative result may occur with  improper specimen collection/handling, submission of specimen other than nasopharyngeal swab, presence of viral mutation(s) within the areas targeted by this assay, and inadequate number of viral copies(<138 copies/mL). A negative result must be combined with clinical observations, patient history, and epidemiological information. The expected result is Negative.  Fact Sheet for Patients:  bloggercourse.com  Fact Sheet for Healthcare Providers:  seriousbroker.it  This test is no t yet approved or cleared by the [place]  FDA and  has been authorized for detection and/or diagnosis of SARS-CoV-2 by FDA under an Emergency Use Authorization (EUA). This EUA will remain  in effect (meaning this test can be used) for the duration of the COVID-19 declaration under Section 564(b)(1) of the Act, 21 U.S.C.section 360bbb-3(b)(1), unless the authorization is terminated  or revoked sooner.       Influenza A by PCR NEGATIVE NEGATIVE Final   Influenza B by PCR NEGATIVE NEGATIVE Final    Comment: (NOTE) The Xpert Xpress SARS-CoV-2/FLU/RSV plus assay is intended as an aid in the diagnosis of influenza from Nasopharyngeal swab specimens and should not be used as a sole basis for treatment. Nasal washings and aspirates are unacceptable for Xpert Xpress SARS-CoV-2/FLU/RSV testing.  Fact  Sheet for Patients: bloggercourse.com  Fact Sheet for Healthcare Providers: seriousbroker.it  This test is not yet approved or cleared by the [place]  FDA and has been authorized for detection and/or diagnosis of SARS-CoV-2 by FDA under an Emergency Use Authorization (EUA). This EUA will remain in effect (meaning this test can be used) for the duration of the COVID-19 declaration under Section 564(b)(1) of the Act, 21 U.S.C. section 360bbb-3(b)(1), unless the authorization is terminated or revoked.     Resp Syncytial Virus by PCR NEGATIVE NEGATIVE Final    Comment: (NOTE) Fact Sheet for Patients: bloggercourse.com  Fact Sheet for Healthcare Providers: seriousbroker.it  This test is not yet approved or cleared by the [place]  FDA and has been authorized for detection and/or diagnosis of SARS-CoV-2 by FDA under an Emergency Use Authorization (EUA). This EUA will remain in effect (meaning this test can be used) for the duration of the COVID-19 declaration under Section 564(b)(1) of the Act, 21 U.S.C. section 360bbb-3(b)(1), unless the authorization is terminated or revoked.  Performed at [facility], [address] [place], [place] [address]   Culture, blood (routine x 2)     Status: Abnormal   Collection Time: 10/06/24  6:31 PM   Specimen: BLOOD RIGHT FOREARM  Result Value Ref Range Status   Specimen Description   Final    BLOOD RIGHT FOREARM Performed at [facility], [address], [place], [place] [address]    Special Requests   Final    BOTTLES DRAWN AEROBIC AND ANAEROBIC Blood Culture results may not be optimal due to an inadequate volume of blood received in culture bottles Performed at [facility], [address] [place],  [place] [address]    Culture  Setup Time   Final    GRAM POSITIVE COCCI AEROBIC BOTTLE  ONLY CRITICAL RESULT CALLED TO, READ BACK BY AND VERIFIED WITH: PHARMD ABBY E. [id number] AT 1925, ADC    Culture (A)  Final    STAPHYLOCOCCUS LUGDUNENSIS STAPHYLOCOCCUS EPIDERMIDIS THE SIGNIFICANCE OF ISOLATING THIS ORGANISM FROM A SINGLE SET OF BLOOD CULTURES WHEN MULTIPLE SETS ARE DRAWN IS UNCERTAIN. PLEASE NOTIFY THE MICROBIOLOGY DEPARTMENT WITHIN ONE WEEK IF SPECIATION AND SENSITIVITIES ARE REQUIRED. Performed at [facility], [address] [place], [place] [address]    Report Status 10/09/2024 FINAL  Final   Organism ID, Bacteria STAPHYLOCOCCUS LUGDUNENSIS  Final      Susceptibility   Staphylococcus lugdunensis - MIC*    CIPROFLOXACIN <=0.5 SENSITIVE Sensitive     ERYTHROMYCIN <=0.25 SENSITIVE Sensitive     GENTAMICIN  <=0.5 SENSITIVE Sensitive     OXACILLIN 1 SENSITIVE Sensitive     TETRACYCLINE >=16 RESISTANT Resistant     VANCOMYCIN  <=0.5 SENSITIVE Sensitive     TRIMETH/SULFA <=10 SENSITIVE Sensitive     CLINDAMYCIN <=0.25 SENSITIVE Sensitive     RIFAMPIN <=0.5 SENSITIVE Sensitive     Inducible Clindamycin NEGATIVE Sensitive     * STAPHYLOCOCCUS LUGDUNENSIS  Blood Culture ID Panel (Reflexed)     Status: Abnormal   Collection Time: 10/06/24  6:31 PM  Result Value Ref Range Status   Enterococcus faecalis NOT DETECTED NOT DETECTED Final   Enterococcus Faecium NOT DETECTED NOT DETECTED Final   Listeria monocytogenes NOT DETECTED NOT DETECTED Final   Staphylococcus species DETECTED (A) NOT DETECTED Final    Comment: CRITICAL RESULT CALLED TO, READ BACK BY AND VERIFIED WITH: PHARMD ABBY E. [id number] AT 1925, ADC    Staphylococcus aureus (BCID) NOT DETECTED NOT DETECTED Final   Staphylococcus epidermidis DETECTED (A) NOT DETECTED Final    Comment: CRITICAL RESULT CALLED TO, READ BACK BY AND VERIFIED WITH: PHARMD ABBY E. [id number] AT 1925, ADC    Staphylococcus lugdunensis DETECTED (A) NOT DETECTED Final    Comment: CRITICAL RESULT CALLED TO, READ BACK BY AND VERIFIED  WITH: PHARMD ABBY E. [id number] AT 1925, ADC    Streptococcus species NOT DETECTED NOT DETECTED Final   Streptococcus agalactiae NOT DETECTED NOT DETECTED Final   Streptococcus pneumoniae NOT DETECTED NOT DETECTED Final   Streptococcus pyogenes NOT DETECTED NOT DETECTED Final   A.calcoaceticus-baumannii NOT DETECTED NOT DETECTED Final   Bacteroides fragilis NOT DETECTED NOT DETECTED Final   Enterobacterales NOT DETECTED NOT DETECTED Final   Enterobacter cloacae complex NOT DETECTED NOT DETECTED Final   Escherichia coli NOT DETECTED NOT DETECTED Final   Klebsiella aerogenes NOT DETECTED NOT DETECTED Final   Klebsiella oxytoca NOT DETECTED NOT DETECTED Final   Klebsiella pneumoniae NOT DETECTED NOT DETECTED Final   Proteus species NOT DETECTED NOT DETECTED Final   Salmonella species NOT DETECTED NOT DETECTED Final   Serratia marcescens NOT DETECTED NOT DETECTED Final   Haemophilus influenzae NOT DETECTED NOT DETECTED Final   Neisseria meningitidis NOT DETECTED NOT DETECTED Final   Pseudomonas aeruginosa NOT DETECTED NOT DETECTED Final   Stenotrophomonas maltophilia NOT DETECTED NOT DETECTED Final   Candida albicans NOT DETECTED NOT DETECTED Final   Candida auris NOT DETECTED NOT DETECTED Final   Candida glabrata NOT DETECTED NOT DETECTED Final   Candida krusei NOT DETECTED NOT DETECTED Final   Candida parapsilosis NOT DETECTED NOT DETECTED Final   Candida tropicalis NOT DETECTED NOT DETECTED Final   Cryptococcus  neoformans/gattii NOT DETECTED NOT DETECTED Final   Methicillin resistance mecA/C NOT DETECTED NOT DETECTED Final    Comment: Performed at [facility], [address] [place], [place] [address]  Culture, blood (routine x 2)     Status: None   Collection Time: 10/06/24  8:36 PM   Specimen: BLOOD LEFT HAND  Result Value Ref Range Status   Specimen Description BLOOD LEFT HAND  Final   Special Requests   Final    BOTTLES DRAWN AEROBIC ONLY Blood Culture adequate volume    Culture   Final    NO GROWTH 5 DAYS Performed at [facility], [address], [place], [place] [address]    Report Status 10/11/2024 FINAL  Final  Culture, blood (Routine X 2) w Reflex to ID Panel     Status: None (Preliminary result)   Collection Time: 10/08/24  4:06 PM   Specimen: BLOOD  Result Value Ref Range Status   Specimen Description   Final    BLOOD BLOOD RIGHT HAND Performed at [facility], [address] [place], [place] [address]    Special Requests   Final    BOTTLES DRAWN AEROBIC ONLY Blood Culture adequate volume Performed at [facility], [address], [place], [place] [address]    Culture   Final    NO GROWTH 4 DAYS Performed at [facility], [address], [place], [place] [address]    Report Status PENDING  Incomplete  Culture, blood (Routine X 2) w Reflex to ID Panel     Status: None (Preliminary result)   Collection Time: 10/08/24  5:18 PM   Specimen: BLOOD  Result Value Ref Range Status   Specimen Description   Final    BLOOD BLOOD RIGHT HAND Performed at [facility], [address] [place], [place] [address]    Special Requests   Final    BOTTLES DRAWN AEROBIC AND ANAEROBIC Blood Culture adequate volume Performed at [facility], [address], [place], [place] [address]    Culture   Final    NO GROWTH 4 DAYS Performed at [facility], [address] [place], [place] [address]    Report Status PENDING  Incomplete    Procedures/Studies: US  EKG SITE RITE Result Date: 10/11/2024 If Site Rite image not attached, placement could not be confirmed due to current cardiac rhythm.  ECHO TEE Result Date: 10/11/2024    TRANSESOPHOGEAL ECHO REPORT   Patient Name:   KIET GEER Date of Exam: 10/11/2024 Medical Rec #:  [id number]        Height:       67.0 in Accession #:    [id number]       Weight:       149.0 lb Date of Birth:  03/29/1941         BSA:           1.784 m Patient Age:    83 years         BP:           119/67 mmHg Patient Gender: M                HR:           74 bpm. Exam Location:  Inpatient Procedure: Transesophageal Echo, Cardiac Doppler and Color Doppler (Both            Spectral and Color Flow Doppler were  utilized during procedure). Indications:     Subacute bacterial endocarditis;  History:         Patient has prior history of Echocardiogram examinations, most                  recent 10/09/2024. Abnormal ECG and Defibrillator, COPD,                  Arrythmias:Atrial Fibrillation and NSVT,                  Signs/Symptoms:Bacteremia; Risk Factors:Hypertension. Chemo.  Sonographer:     Ellouise Mose RDCS Referring Phys:  [id number] ZANE ADAMS Diagnosing Phys: Jerel Balding MD PROCEDURE: After discussion of the risks and benefits of a TEE, an informed consent was obtained from the patient. The transesophogeal probe was passed without difficulty through the esophogus of the patient. Imaged were obtained with the patient in a left lateral decubitus position. Sedation performed by different physician. The patient was monitored while under deep sedation. Anesthestetic sedation was provided intravenously by Anesthesiology: 170mg  of Propofol , 100mg  of Lidocaine . The patient's vital signs; including heart rate, blood pressure, and oxygen saturation; remained stable throughout the procedure. The patient developed no complications during the procedure.  IMPRESSIONS  1. Left ventricular ejection fraction, by estimation, is 45 to 50%. The left ventricle has mildly decreased function. The left ventricle has no regional wall motion abnormalities. There is mild concentric left ventricular hypertrophy.  2. Right ventricular systolic function is normal. The right ventricular size is normal. Moderately increased right ventricular wall thickness. Tricuspid regurgitation signal is inadequate for assessing PA pressure. The estimated right ventricular systolic pressure is 19.4  mmHg.  3. Left atrial size was mildly dilated. No left atrial/left atrial appendage thrombus was detected.  4. The mitral valve is normal in structure. No evidence of mitral valve regurgitation. No evidence of mitral stenosis.  5. The aortic valve is tricuspid. There is mild thickening of the aortic valve. Aortic valve regurgitation is not visualized. Aortic valve sclerosis is present, with no evidence of aortic valve stenosis.  6. There is mild (Grade II) protruding plaque involving the descending aorta. Conclusion(s)/Recommendation(s): No evidence of vegetation/infective endocarditis on this transesophageael echocardiogram. FINDINGS  Left Ventricle: Left ventricular ejection fraction, by estimation, is 45 to 50%. The left ventricle has mildly decreased function. The left ventricle has no regional wall motion abnormalities. The left ventricular internal cavity size was normal in size. There is mild concentric left ventricular hypertrophy. Right Ventricle: The right ventricular size is normal. Moderately increased right ventricular wall thickness. Right ventricular systolic function is normal. Tricuspid regurgitation signal is inadequate for assessing PA pressure. The tricuspid regurgitant  velocity is 1.90 m/s, and with an assumed right atrial pressure of 5 mmHg, the estimated right ventricular systolic pressure is 19.4 mmHg. Left Atrium: Left atrial size was mildly dilated. No left atrial/left atrial appendage thrombus was detected. Right Atrium: Right atrial size was normal in size. Pericardium: There is no evidence of pericardial effusion. Mitral Valve: The mitral valve is normal in structure. No evidence of mitral valve regurgitation. No evidence of mitral valve stenosis. Tricuspid Valve: The tricuspid valve is normal in structure. Tricuspid valve regurgitation is not demonstrated. Aortic Valve: The aortic valve is tricuspid. There is mild thickening of the aortic valve. Aortic valve regurgitation is not  visualized. Aortic valve sclerosis is present, with no evidence of aortic valve stenosis. Pulmonic Valve: The pulmonic valve was normal in structure. Pulmonic valve regurgitation is not visualized. No  evidence of pulmonic stenosis. Aorta: The aortic root and ascending aorta are structurally normal, with no evidence of dilitation. There is mild (Grade II) protruding plaque involving the descending aorta. IAS/Shunts: No atrial level shunt detected by color flow Doppler. Additional Comments: A device lead is visualized in the right ventricle, right atrium and superior vena cava. Spectral Doppler performed. TRICUSPID VALVE TR Peak grad:   14.4 mmHg TR Vmax:        190.00 cm/s Jerel Balding MD Electronically signed by Jerel Balding MD Signature Date/Time: 10/11/2024/2:13:42 PM    Final    EP STUDY Result Date: 10/11/2024 See surgical note for result.  ECHOCARDIOGRAM COMPLETE Result Date: 10/09/2024    ECHOCARDIOGRAM REPORT   Patient Name:   FITZ MATSUO Date of Exam: 10/09/2024 Medical Rec #:  [id number]        Height:       67.0 in Accession #:    [id number]       Weight:       149.0 lb Date of Birth:  [birth year]         BSA:          1.784 m Patient Age:    83 years         BP:           101/58 mmHg Patient Gender: M                HR:           100 bpm. Exam Location:  Inpatient Procedure: 2D Echo (Both Spectral and Color Flow Doppler were utilized during            procedure). Indications:    Endocarditis  History:        Patient has prior history of Echocardiogram examinations.                 Pacemaker; Signs/Symptoms:Bacteremia.  Sonographer:    Norleen Amour Referring Phys: [id number] [facility] IMPRESSIONS  1. NO obvious vegetations though cannot completely exclude If clinically indicated, recommend TEE.  2. HYpokinesis of the lateral wall (base, mid). Left ventricular ejection fraction, by estimation, is 45 to 50%. The left ventricle has mildly decreased function. Left ventricular diastolic  parameters are consistent with Grade I diastolic dysfunction (impaired relaxation).  3. Right ventricular systolic function is normal. The right ventricular size is normal. There is normal pulmonary artery systolic pressure.  4. Left atrial size was moderately dilated.  5. The mitral valve is normal in structure. Mild mitral valve regurgitation.  6. The aortic valve is tricuspid. Aortic valve regurgitation is not visualized. Aortic valve sclerosis/calcification is present, without any evidence of aortic stenosis.  7. The inferior vena cava is normal in size with greater than 50% respiratory variability, suggesting right atrial pressure of 3 mmHg. FINDINGS  Left Ventricle: HYpokinesis of the lateral wall (base, mid). Left ventricular ejection fraction, by estimation, is 45 to 50%. The left ventricle has mildly decreased function. The left ventricular internal cavity size was normal in size. There is no left ventricular hypertrophy. Left ventricular diastolic parameters are consistent with Grade I diastolic dysfunction (impaired relaxation). Right Ventricle: The right ventricular size is normal. Right vetricular wall thickness was not assessed. Right ventricular systolic function is normal. There is normal pulmonary artery systolic pressure. The tricuspid regurgitant velocity is 2.87 m/s, and with an assumed right atrial pressure of 3 mmHg, the estimated right ventricular systolic pressure is 35.9 mmHg. Left Atrium: Left atrial size was  moderately dilated. Right Atrium: Right atrial size was normal in size. Pericardium: Trivial pericardial effusion is present. Mitral Valve: The mitral valve is normal in structure. Mild mitral valve regurgitation. MV peak gradient, 4.0 mmHg. The mean mitral valve gradient is 1.0 mmHg. Tricuspid Valve: The tricuspid valve is normal in structure. Tricuspid valve regurgitation is mild. Aortic Valve: The aortic valve is tricuspid. Aortic valve regurgitation is not visualized. Aortic valve  sclerosis/calcification is present, without any evidence of aortic stenosis. Pulmonic Valve: The pulmonic valve was normal in structure. Pulmonic valve regurgitation is not visualized. Aorta: The aortic root and ascending aorta are structurally normal, with no evidence of dilitation. Venous: The inferior vena cava is normal in size with greater than 50% respiratory variability, suggesting right atrial pressure of 3 mmHg. IAS/Shunts: No atrial level shunt detected by color flow Doppler. Additional Comments: A device lead is visualized.  LEFT VENTRICLE PLAX 2D LVIDd:         4.60 cm     Diastology LVIDs:         3.60 cm     LV e' medial:    6.85 cm/s LV PW:         1.10 cm     LV E/e' medial:  15.8 LV IVS:        0.80 cm     LV e' lateral:   9.36 cm/s LVOT diam:     2.20 cm     LV E/e' lateral: 11.5 LV SV:         48 LV SV Index:   27 LVOT Area:     3.80 cm LV IVRT:       90 msec  LV Volumes (MOD) LV vol d, MOD A2C: 84.1 ml LV vol d, MOD A4C: 93.6 ml LV vol s, MOD A2C: 44.2 ml LV vol s, MOD A4C: 51.2 ml LV SV MOD A2C:     39.9 ml LV SV MOD A4C:     93.6 ml LV SV MOD BP:      43.3 ml RIGHT VENTRICLE             IVC RV Basal diam:  3.50 cm     IVC diam: 1.50 cm RV S prime:     16.10 cm/s TAPSE (M-mode): 2.5 cm      PULMONARY VEINS                             Diastolic Velocity: 72.40 cm/s                             S/D Velocity:       0.80                             Systolic Velocity:  57.80 cm/s LEFT ATRIUM             Index        RIGHT ATRIUM           Index LA Vol (A2C):   68.0 ml 38.11 ml/m  RA Area:     18.50 cm LA Vol (A4C):   78.5 ml 43.99 ml/m  RA Volume:   44.80 ml  25.11 ml/m LA Biplane Vol: 74.2 ml 41.58 ml/m  AORTIC VALVE             PULMONIC VALVE LVOT  Vmax:   73.10 cm/s  PV Vmax:       1.11 m/s LVOT Vmean:  45.400 cm/s PV Peak grad:  4.9 mmHg LVOT VTI:    0.127 m  AORTA Ao Root diam: 2.70 cm Ao Asc diam:  3.00 cm MITRAL VALVE                TRICUSPID VALVE MV Area (PHT): 4.46 cm     TR Peak grad:    32.9 mmHg MV Area VTI:   2.04 cm     TR Vmax:        287.00 cm/s MV Peak grad:  4.0 mmHg MV Mean grad:  1.0 mmHg     SHUNTS MV Vmax:       1.00 m/s     Systemic VTI:  0.13 m MV Vmean:      54.9 cm/s    Systemic Diam: 2.20 cm MV Decel Time: 170 msec MV E velocity: 108.00 cm/s MV A velocity: 52.30 cm/s MV E/A ratio:  2.07 Vina Gull MD Electronically signed by Vina Gull MD Signature Date/Time: 10/09/2024/1:01:31 PM    Final    VAS US  LOWER EXTREMITY VENOUS (DVT) (ONLY [facility] & [facility]) Result Date: 10/07/2024  Lower Venous DVT Study Patient Name:  CAYLIN RABY  Date of Exam:   10/06/2024 Medical Rec #: [id number]         Accession #:    [id number] Date of Birth: Mar 12, 1941          Patient Gender: M Patient Age:   39 years Exam Location:  [facility] Procedure:      VAS US  LOWER EXTREMITY VENOUS (DVT) Referring Phys: MATTHEW TRIFAN --------------------------------------------------------------------------------  Indications: Pain, and Darkened erythema along lateral lower leg.  Risk Factors: Cancer: Primary adenocarcinoma of upper lobe of left lung, stage IIIC/IV, on chemotherapy Paroxysmal atrial fibrillation, ICD. Limitations: Pain with compression maneuvers in calf. Comparison Study: Prior negative right LEV done 02/26/16 Performing Technologist: Alberta Lis RVS  Examination Guidelines: A complete evaluation includes B-mode imaging, spectral Doppler, color Doppler, and power Doppler as needed of all accessible portions of each vessel. Bilateral testing is considered an integral part of a complete examination. Limited examinations for reoccurring indications may be performed as noted. The reflux portion of the exam is performed with the patient in reverse Trendelenburg.  +---------+---------------+---------+-----------+----------+---------------+ RIGHT    CompressibilityPhasicitySpontaneityPropertiesThrombus Aging  +---------+---------------+---------+-----------+----------+---------------+ CFV       Full           Yes      Yes                                  +---------+---------------+---------+-----------+----------+---------------+ SFJ      Full                                                         +---------+---------------+---------+-----------+----------+---------------+ FV Prox  Full           Yes      Yes                                  +---------+---------------+---------+-----------+----------+---------------+ FV Mid   Full                                                         +---------+---------------+---------+-----------+----------+---------------+  FV DistalFull                                                         +---------+---------------+---------+-----------+----------+---------------+ PFV      Full           Yes      Yes                                  +---------+---------------+---------+-----------+----------+---------------+ POP      Full           Yes      Yes                                  +---------+---------------+---------+-----------+----------+---------------+ PTV                                                   patent by color +---------+---------------+---------+-----------+----------+---------------+ PERO                                                  patent by color +---------+---------------+---------+-----------+----------+---------------+   +----+---------------+---------+-----------+----------+--------------+ LEFTCompressibilityPhasicitySpontaneityPropertiesThrombus Aging +----+---------------+---------+-----------+----------+--------------+ CFV Full           Yes      Yes                                 +----+---------------+---------+-----------+----------+--------------+ SFJ Full                                                        +----+---------------+---------+-----------+----------+--------------+     Summary: RIGHT: - There is no evidence of deep vein thrombosis in the  lower extremity.  - Ultrasound characteristics of enlarged lymph nodes are noted in the groin.  LEFT: - No evidence of common femoral vein obstruction.  - Ultrasound characteristics of enlarged lymph nodes noted in the groin.  *See table(s) above for measurements and observations. Electronically signed by Penne Colorado MD on 10/07/2024 at 10:44:45 AM.    Final    DG Chest 2 View Result Date: 10/06/2024 EXAM: 2 VIEW(S) XRAY OF THE CHEST 10/06/2024 02:13:00 PM COMPARISON: 07/12/2024 CLINICAL HISTORY: chest pain FINDINGS: LINES, TUBES AND DEVICES: Stable left subclavian AICD. LUNGS AND PLEURA: Persistent lingular focal masslike consolidation. No pleural effusion. No pneumothorax. HEART AND MEDIASTINUM: AP window is obscured, suggesting mediastinal adenopathy as before. No acute abnormality of the cardiac silhouette. BONES AND SOFT TISSUES: Thoracolumbar fixation hardware partially visualized without acute finding. No acute osseous abnormality. IMPRESSION: 1. Persistent lingular focal masslike consolidation, unchanged from prior study. 2. AP window is obscured suggesting mediastinal adenopathy, unchanged from prior study. Electronically signed by: Dayne Hassell MD 10/06/2024 02:20 PM EST RP Workstation: [id number]  Labs: BNP (last 3 results) No results for input(s): BNP in the last 8760 hours. Basic Metabolic Panel: Recent Labs  Lab 10/07/24 0623 10/08/24 0704 10/09/24 0635 10/10/24 0606 10/11/24 0713 10/12/24 0556  NA 139 142 141 142 141 142  K 3.8 4.1 3.5 3.5 3.3* 3.1*  CL 111 112* 111 111 111 111  CO2 20* 20* 20* 20* 23 22  GLUCOSE 112* 110* 119* 101* 114* 115*  BUN 22 16 16 11 10 9   CREATININE 1.08 1.05 1.15 1.08 1.21 0.95  CALCIUM  8.8* 8.7* 8.8* 8.7* 8.9 8.9  MG 1.9  --   --   --   --   --    Liver Function Tests: Recent Labs  Lab 10/06/24 1432  AST 19  ALT 8  ALKPHOS 43  BILITOT 0.8  PROT 6.5  ALBUMIN  3.2*   Recent Labs  Lab 10/06/24 1432  LIPASE 11   No results for  input(s): AMMONIA in the last 168 hours. CBC: Recent Labs  Lab 10/08/24 0704 10/09/24 0635 10/10/24 0606 10/11/24 0713 10/12/24 0556  WBC 1.3* 1.9* 5.0 6.9 6.3  NEUTROABS 0.6* 1.3* 4.0 5.3 4.5  HGB 7.5* 6.8* 7.3* 7.8* 8.2*  HCT 23.6* 20.6* 22.1* 24.0* 25.6*  MCV 100.0 97.6 96.9 97.6 98.5  PLT 16* 22* 23* 33* 45*   CBG: Recent Labs  Lab 10/10/24 0752  GLUCAP 104*   Hgb A1c No results for input(s): HGBA1C in the last 72 hours. Anemia work up No results for input(s): VITAMINB12, FOLATE, FERRITIN, TIBC, IRON, RETICCTPCT in the last 72 hours. Cardiac Enzymes: No results for input(s): CKTOTAL, CKMB, CKMBINDEX, TROPONINI in the last 168 hours. BNP: Invalid input(s): POCBNP D-Dimer No results for input(s): DDIMER in the last 72 hours. Lipid Profile No results for input(s): CHOL, HDL, LDLCALC, TRIG, CHOLHDL, LDLDIRECT in the last 72 hours. Thyroid  function studies No results for input(s): TSH, T4TOTAL, T3FREE, THYROIDAB in the last 72 hours.  Invalid input(s): FREET3 Urinalysis    Component Value Date/Time   COLORURINE YELLOW 07/12/2024 1325   APPEARANCEUR HAZY (A) 07/12/2024 1325   LABSPEC 1.012 07/12/2024 1325   PHURINE 6.0 07/12/2024 1325   GLUCOSEU NEGATIVE 07/12/2024 1325   HGBUR NEGATIVE 07/12/2024 1325   BILIRUBINUR NEGATIVE 07/12/2024 1325   KETONESUR NEGATIVE 07/12/2024 1325   PROTEINUR NEGATIVE 07/12/2024 1325   UROBILINOGEN 0.2 11/22/2011 0247   NITRITE POSITIVE (A) 07/12/2024 1325   LEUKOCYTESUR TRACE (A) 07/12/2024 1325   Sepsis Labs Recent Labs  Lab 10/09/24 0635 10/10/24 0606 10/11/24 0713 10/12/24 0556  WBC 1.9* 5.0 6.9 6.3   Microbiology Recent Results (from the past 240 hours)  Resp panel by RT-PCR (RSV, Flu A&B, Covid) Anterior Nasal Swab     Status: None   Collection Time: 10/06/24  1:42 PM   Specimen: Anterior Nasal Swab  Result Value Ref Range Status   SARS Coronavirus 2 by RT PCR  NEGATIVE NEGATIVE Final    Comment: (NOTE) SARS-CoV-2 target nucleic acids are NOT DETECTED.  The SARS-CoV-2 RNA is generally detectable in upper respiratory specimens during the acute phase of infection. The lowest concentration of SARS-CoV-2 viral copies this assay can detect is 138 copies/mL. A negative result does not preclude SARS-Cov-2 infection and should not be used as the sole basis for treatment or other patient management decisions. A negative result may occur with  improper specimen collection/handling, submission of specimen other than nasopharyngeal swab, presence of viral mutation(s) within the areas targeted by this assay, and inadequate number of  viral copies(<138 copies/mL). A negative result must be combined with clinical observations, patient history, and epidemiological information. The expected result is Negative.  Fact Sheet for Patients:  bloggercourse.com  Fact Sheet for Healthcare Providers:  seriousbroker.it  This test is no t yet approved or cleared by the [place]  FDA and  has been authorized for detection and/or diagnosis of SARS-CoV-2 by FDA under an Emergency Use Authorization (EUA). This EUA will remain  in effect (meaning this test can be used) for the duration of the COVID-19 declaration under Section 564(b)(1) of the Act, 21 U.S.C.section 360bbb-3(b)(1), unless the authorization is terminated  or revoked sooner.       Influenza A by PCR NEGATIVE NEGATIVE Final   Influenza B by PCR NEGATIVE NEGATIVE Final    Comment: (NOTE) The Xpert Xpress SARS-CoV-2/FLU/RSV plus assay is intended as an aid in the diagnosis of influenza from Nasopharyngeal swab specimens and should not be used as a sole basis for treatment. Nasal washings and aspirates are unacceptable for Xpert Xpress SARS-CoV-2/FLU/RSV testing.  Fact Sheet for Patients: bloggercourse.com  Fact Sheet for  Healthcare Providers: seriousbroker.it  This test is not yet approved or cleared by the [place]  FDA and has been authorized for detection and/or diagnosis of SARS-CoV-2 by FDA under an Emergency Use Authorization (EUA). This EUA will remain in effect (meaning this test can be used) for the duration of the COVID-19 declaration under Section 564(b)(1) of the Act, 21 U.S.C. section 360bbb-3(b)(1), unless the authorization is terminated or revoked.     Resp Syncytial Virus by PCR NEGATIVE NEGATIVE Final    Comment: (NOTE) Fact Sheet for Patients: bloggercourse.com  Fact Sheet for Healthcare Providers: seriousbroker.it  This test is not yet approved or cleared by the [place]  FDA and has been authorized for detection and/or diagnosis of SARS-CoV-2 by FDA under an Emergency Use Authorization (EUA). This EUA will remain in effect (meaning this test can be used) for the duration of the COVID-19 declaration under Section 564(b)(1) of the Act, 21 U.S.C. section 360bbb-3(b)(1), unless the authorization is terminated or revoked.  Performed at [facility], [address], [place], [place] [address]   Culture, blood (routine x 2)     Status: Abnormal   Collection Time: 10/06/24  6:31 PM   Specimen: BLOOD RIGHT FOREARM  Result Value Ref Range Status   Specimen Description   Final    BLOOD RIGHT FOREARM Performed at [facility], [address] [place], [place] [address]    Special Requests   Final    BOTTLES DRAWN AEROBIC AND ANAEROBIC Blood Culture results may not be optimal due to an inadequate volume of blood received in culture bottles Performed at [facility], [address], [place], [place] [address]    Culture  Setup Time   Final    GRAM POSITIVE COCCI AEROBIC BOTTLE ONLY CRITICAL RESULT CALLED TO, READ BACK BY AND VERIFIED WITH: PHARMD ABBY E.  [id number] AT 1925, ADC    Culture (A)  Final    STAPHYLOCOCCUS LUGDUNENSIS STAPHYLOCOCCUS EPIDERMIDIS THE SIGNIFICANCE OF ISOLATING THIS ORGANISM FROM A SINGLE SET OF BLOOD CULTURES WHEN MULTIPLE SETS ARE DRAWN IS UNCERTAIN. PLEASE NOTIFY THE MICROBIOLOGY DEPARTMENT WITHIN ONE WEEK IF SPECIATION AND SENSITIVITIES ARE REQUIRED. Performed at [facility], [address], [place], [place] [address]    Report Status 10/09/2024 FINAL  Final   Organism ID, Bacteria STAPHYLOCOCCUS LUGDUNENSIS  Final      Susceptibility  Staphylococcus lugdunensis - MIC*    CIPROFLOXACIN <=0.5 SENSITIVE Sensitive     ERYTHROMYCIN <=0.25 SENSITIVE Sensitive     GENTAMICIN  <=0.5 SENSITIVE Sensitive     OXACILLIN 1 SENSITIVE Sensitive     TETRACYCLINE >=16 RESISTANT Resistant     VANCOMYCIN  <=0.5 SENSITIVE Sensitive     TRIMETH/SULFA <=10 SENSITIVE Sensitive     CLINDAMYCIN <=0.25 SENSITIVE Sensitive     RIFAMPIN <=0.5 SENSITIVE Sensitive     Inducible Clindamycin NEGATIVE Sensitive     * STAPHYLOCOCCUS LUGDUNENSIS  Blood Culture ID Panel (Reflexed)     Status: Abnormal   Collection Time: 10/06/24  6:31 PM  Result Value Ref Range Status   Enterococcus faecalis NOT DETECTED NOT DETECTED Final   Enterococcus Faecium NOT DETECTED NOT DETECTED Final   Listeria monocytogenes NOT DETECTED NOT DETECTED Final   Staphylococcus species DETECTED (A) NOT DETECTED Final    Comment: CRITICAL RESULT CALLED TO, READ BACK BY AND VERIFIED WITH: PHARMD ABBY E. [id number] AT 1925, ADC    Staphylococcus aureus (BCID) NOT DETECTED NOT DETECTED Final   Staphylococcus epidermidis DETECTED (A) NOT DETECTED Final    Comment: CRITICAL RESULT CALLED TO, READ BACK BY AND VERIFIED WITH: PHARMD ABBY E. [id number] AT 1925, ADC    Staphylococcus lugdunensis DETECTED (A) NOT DETECTED Final    Comment: CRITICAL RESULT CALLED TO, READ BACK BY AND VERIFIED WITH: PHARMD ABBY E. [id number] AT 1925, ADC    Streptococcus species NOT DETECTED NOT  DETECTED Final   Streptococcus agalactiae NOT DETECTED NOT DETECTED Final   Streptococcus pneumoniae NOT DETECTED NOT DETECTED Final   Streptococcus pyogenes NOT DETECTED NOT DETECTED Final   A.calcoaceticus-baumannii NOT DETECTED NOT DETECTED Final   Bacteroides fragilis NOT DETECTED NOT DETECTED Final   Enterobacterales NOT DETECTED NOT DETECTED Final   Enterobacter cloacae complex NOT DETECTED NOT DETECTED Final   Escherichia coli NOT DETECTED NOT DETECTED Final   Klebsiella aerogenes NOT DETECTED NOT DETECTED Final   Klebsiella oxytoca NOT DETECTED NOT DETECTED Final   Klebsiella pneumoniae NOT DETECTED NOT DETECTED Final   Proteus species NOT DETECTED NOT DETECTED Final   Salmonella species NOT DETECTED NOT DETECTED Final   Serratia marcescens NOT DETECTED NOT DETECTED Final   Haemophilus influenzae NOT DETECTED NOT DETECTED Final   Neisseria meningitidis NOT DETECTED NOT DETECTED Final   Pseudomonas aeruginosa NOT DETECTED NOT DETECTED Final   Stenotrophomonas maltophilia NOT DETECTED NOT DETECTED Final   Candida albicans NOT DETECTED NOT DETECTED Final   Candida auris NOT DETECTED NOT DETECTED Final   Candida glabrata NOT DETECTED NOT DETECTED Final   Candida krusei NOT DETECTED NOT DETECTED Final   Candida parapsilosis NOT DETECTED NOT DETECTED Final   Candida tropicalis NOT DETECTED NOT DETECTED Final   Cryptococcus neoformans/gattii NOT DETECTED NOT DETECTED Final   Methicillin resistance mecA/C NOT DETECTED NOT DETECTED Final    Comment: Performed at [facility], [address], [place], [place] [address]  Culture, blood (routine x 2)     Status: None   Collection Time: 10/06/24  8:36 PM   Specimen: BLOOD LEFT HAND  Result Value Ref Range Status   Specimen Description BLOOD LEFT HAND  Final   Special Requests   Final    BOTTLES DRAWN AEROBIC ONLY Blood Culture adequate volume   Culture   Final    NO GROWTH 5 DAYS Performed at [facility], [address] [place], [place] [address]    Report Status 10/11/2024 FINAL  Final  Culture, blood (Routine X 2) w Reflex to ID Panel     Status: None (Preliminary result)   Collection Time: 10/08/24  4:06 PM   Specimen: BLOOD  Result Value Ref Range Status   Specimen Description   Final    BLOOD BLOOD RIGHT HAND Performed at [facility], [address] [place], [place] [address]    Special Requests   Final    BOTTLES DRAWN AEROBIC ONLY Blood Culture adequate volume Performed at [facility], [address], [place], [place] [address]    Culture   Final    NO GROWTH 4 DAYS Performed at [facility], [address] [place], [place] [address]    Report Status PENDING  Incomplete  Culture, blood (Routine X 2) w Reflex to ID Panel     Status: None (Preliminary result)   Collection Time: 10/08/24  5:18 PM   Specimen: BLOOD  Result Value Ref Range Status   Specimen Description   Final    BLOOD BLOOD RIGHT HAND Performed at [facility], [address] [place], [place] [address]    Special Requests   Final    BOTTLES DRAWN AEROBIC AND ANAEROBIC Blood Culture adequate volume Performed at [facility], [address] [place], [place] [address]    Culture   Final    NO GROWTH 4 DAYS Performed at [facility], [address], [place], [place] [address]    Report Status PENDING  Incomplete     Time coordinating discharge: 35 minutes  SIGNED: Mennie LAMY, MD  [facility] 10/12/2024, 5:21 PM  If 7PM-7AM, please contact night-coverage [email or web address]

## 2024-10-12 NOTE — Progress Notes (Signed)
 PROGRESS NOTE [name]  FMW:[id number] DOB: 04-Dec-1940 DOA: 10/06/2024 PCP: [name], [name]  Brief Narrative/Hospital Course: [name] is a 83 y.o. male with PMH of A-fib on Eliquis , AICD in place, CAD chronic systolic CHF CKD stage III, history of renal stones, hypertension, legally blind, peripheral artery disease, stage IIIc/IV NSCLC-adenocarcinoma, currently on reduced dose of chemotherapy S/P 4 cycle carboplatin , Alimta , and Keytruda  which has been discontinued due to intolerance, previous pancytopenia needing G-CSF presented with increasing fatigue, pain above his pacemaker in the right leg. In the ZI:wnuzi to have darker erythema along his lateral lower leg, DVT was negative given his immunocompromise status admitted with right lower extremity cellulitis, DVT study negative, bacteremia with Staph Luggdunesis ID Ep consulted given PM TTE ok,s/p TEE no vegetation Plan for PICC line and 6 wk of ABX  Subjective: Seen and examined Waiting for PICC Overnight afebrile BP stable on room air Labs this morning with mild hypokalemia 3.1 hemoglobin improved to 8.2 WBC 6.3 platelet 45  Discharge Diagnoses:   Right leg cellulitis Bacteremia w/ Staph lugudnesis Immunocompromise status: Admitted with right lower extremity cellulitis, DVT study negative, bacteremia with Staph Luggdunesis ID following repeat blood culture 11/17 NGTD Given pacemaker status EP cardiology s/p TEE 11/20-no vegetation EP has evaluated patient and at this point plan for 6 weeks of IV Ancef  EOT 12/29 ID will arrange outpatient follow-up   Pancytopenia due to chemotherapy: S/p filgastrim and transfusion of platelet and PRBC.  Labs trending up.  Follow-up outpatient   PAF: Rate controlled.Eliquis  remains on hold due to severe thrombocytopenia and anemia -no need instruction from oncology to resume  CKD 3b Mild metabolic acidosis Mild hypokalemia: Renal function remains stable. Replacing  k.  Constipation: Continue bowel regimen  CAD/NICM Chronic systolic CHF ICD in place: EF 64-59% from echo 04/30/2023 with G1 DD. BP fairly stable. Continue patient's Entresto , Toprol  Imdur  as BP allows- hold Aldactone , Lasix  for now.   Given bacteremia EP has been made aware TTE-no obvious vegetation but cannot completely exclude EF 45-50% G1 DD EP ( Renee) aware for eval at [facility] after TEE  PVD: Monitor  NSCLC stage IIIc/IV, ACC: Initially diagnosed August 2024 received chemoradiation with carboplatin  and paclitaxel  last received consolidation immunotherapy Imfinzi . on systemic treatment with pemetrexed  + pembrolizumab .  Status post cycle 5 on 09/27/2024.   [name] is arranging outpatient follow-up  DVT prophylaxis: Place and maintain sequential compression device Start: 10/07/24 1150 no chemical prophylaxis due to thrombocytopenia Code Status:   Code Status: Full Code Family Communication: plan of care discussed with patient at bedside.  Wife updated Patient status is: Remains hospitalized because of severity of illness Level of care: Telemetry   Dispo: The patient is from: Home with wife, ambulatory at baseline            Anticipated disposition: home w/ picc line today or tomorrow Objective: Vitals last 24 hrs: Vitals:   10/11/24 2046 10/12/24 0511 10/12/24 0840 10/12/24 1311  BP: 138/75 116/71  103/67  Pulse: 76 100  73  Resp: 18 18  16   Temp: 98.8 F (37.1 C) 98.6 F (37 C)  98.4 F (36.9 C)  TempSrc: Oral Oral  Oral  SpO2: 100% 100% 95% 100%  Weight:      Height:        Physical Examination: General exam: AAOX3 HEENT:Oral mucosa moist, Ear/Nose WNL grossly Respiratory system: CTA Bilaterally Cardiovascular system: S1 & S2 +, No JVD. Gastrointestinal system: Abdomen soft,NT,ND, BS+ Nervous System: Alert,  awake, moving all extremities,and following commands. Extremities: extremities warm, right lower extremity mildly tender Skin: Warm, no rashes MSK: Normal  muscle bulk,tone, power   Medications reviewed:  Scheduled Meds:  sodium chloride    Intravenous Once   acetaminophen   650 mg Oral Once   brimonidine   1 drop Both Eyes BID   cycloSPORINE   1 drop Both Eyes BID   dorzolamide -timolol   1 drop Both Eyes BID   feeding supplement  237 mL Oral BID BM   fluticasone  furoate-vilanterol  1 puff Inhalation q morning   isosorbide  mononitrate  30 mg Oral Daily   Latanoprostene Bunod   1 drop Ophthalmic QHS   metoprolol  succinate  25 mg Oral Daily   Netarsudil -Latanoprost   1 drop Both Eyes QHS   pantoprazole   40 mg Oral Daily   potassium chloride   40 mEq Oral Q3H   sacubitril -valsartan   1 tablet Oral BID   Continuous Infusions:   ceFAZolin  (ANCEF ) IV 2 g (10/12/24 0950)   Diet: Diet Order             Diet - low sodium heart healthy           Diet Heart Room service appropriate? Yes; Fluid consistency: Thin  Diet effective now                    Data Reviewed: I have personally reviewed following labs and imaging studies ( see epic result tab) CBC: Recent Labs  Lab 10/08/24 0704 10/09/24 0635 10/10/24 0606 10/11/24 0713 10/12/24 0556  WBC 1.3* 1.9* 5.0 6.9 6.3  NEUTROABS 0.6* 1.3* 4.0 5.3 4.5  HGB 7.5* 6.8* 7.3* 7.8* 8.2*  HCT 23.6* 20.6* 22.1* 24.0* 25.6*  MCV 100.0 97.6 96.9 97.6 98.5  PLT 16* 22* 23* 33* 45*   CMP: Recent Labs  Lab 10/07/24 0623 10/08/24 0704 10/09/24 0635 10/10/24 0606 10/11/24 0713 10/12/24 0556  NA 139 142 141 142 141 142  K 3.8 4.1 3.5 3.5 3.3* 3.1*  CL 111 112* 111 111 111 111  CO2 20* 20* 20* 20* 23 22  GLUCOSE 112* 110* 119* 101* 114* 115*  BUN 22 16 16 11 10 9   CREATININE 1.08 1.05 1.15 1.08 1.21 0.95  CALCIUM  8.8* 8.7* 8.8* 8.7* 8.9 8.9  MG 1.9  --   --   --   --   --    GFR: Estimated Creatinine Clearance: 53.2 mL/min (by C-G formula based on SCr of 0.95 mg/dL). Recent Labs  Lab 10/06/24 1432  AST 19  ALT 8  ALKPHOS 43  BILITOT 0.8  PROT 6.5  ALBUMIN  3.2*    Recent Labs  Lab  10/06/24 1432  LIPASE 11   No results for input(s): AMMONIA in the last 168 hours. Coagulation Profile: No results for input(s): INR, PROTIME in the last 168 hours. Unresulted Labs (From admission, onward)    None      Antimicrobials/Microbiology: Anti-infectives (From admission, onward)    Start     Dose/Rate Route Frequency Ordered Stop   10/12/24 0000  ceFAZolin  (ANCEF ) IVPB        2 g Intravenous Every 12 hours 10/12/24 1104 11/20/24 2359   10/11/24 2200  ceFAZolin  (ANCEF ) IVPB 2g/100 mL premix        2 g 200 mL/hr over 30 Minutes Intravenous Every 12 hours 10/11/24 1437     10/08/24 1630  ceFAZolin  (ANCEF ) IVPB 2g/100 mL premix  Status:  Discontinued        2 g 200  mL/hr over 30 Minutes Intravenous Every 8 hours 10/08/24 1542 10/11/24 1437   10/07/24 1900  vancomycin  (VANCOCIN ) IVPB 1000 mg/200 mL premix  Status:  Discontinued        1,000 mg 200 mL/hr over 60 Minutes Intravenous Every 24 hours 10/06/24 1904 10/08/24 1542   10/06/24 1900  ceFEPIme  (MAXIPIME ) 2 g in sodium chloride  0.9 % 100 mL IVPB  Status:  Discontinued        2 g 200 mL/hr over 30 Minutes Intravenous Every 12 hours 10/06/24 1832 10/08/24 1542   10/06/24 1745  vancomycin  (VANCOCIN ) IVPB 1000 mg/200 mL premix  Status:  Discontinued        1,000 mg 200 mL/hr over 60 Minutes Intravenous  Once 10/06/24 1730 10/06/24 1732   10/06/24 1745  ceFEPIme  (MAXIPIME ) 2 g in sodium chloride  0.9 % 100 mL IVPB  Status:  Discontinued        2 g 200 mL/hr over 30 Minutes Intravenous  Once 10/06/24 1730 10/06/24 1832   10/06/24 1745  vancomycin  (VANCOREADY) IVPB 1500 mg/300 mL        1,500 mg 150 mL/hr over 120 Minutes Intravenous  Once 10/06/24 1732 10/06/24 2130         Component Value Date/Time   SDES  10/08/2024 1718    BLOOD BLOOD RIGHT HAND Performed at [facility], [address] [place], [place] [address]    SPECREQUEST  10/08/2024 1718    BOTTLES DRAWN AEROBIC AND ANAEROBIC Blood  Culture adequate volume Performed at [facility], [address] [place], [place] [address]    CULT  10/08/2024 1718    NO GROWTH 4 DAYS Performed at [facility], [address], [place], [place] [address]    REPTSTATUS PENDING 10/08/2024 1718    Procedures: Procedure(s) (LRB): TRANSESOPHAGEAL ECHOCARDIOGRAM (N/A)   Rodney LAMY, [name] [facility] 10/12/2024, 1:47 PM

## 2024-10-13 LAB — CULTURE, BLOOD (ROUTINE X 2)
Culture: NO GROWTH
Culture: NO GROWTH
Special Requests: ADEQUATE
Special Requests: ADEQUATE

## 2024-10-15 NOTE — Progress Notes (Deleted)
 [facility] OFFICE PROGRESS NOTE  [name], MD [address], [address] [place] [address]  DIAGNOSIS: Stage IIIC/IV (T3, N3, M0) non-small cell lung cancer, adenocarcinoma. He presented with a left upper lobe spiculated mass in addition to lingular lesion and suspicious mediastinal lymphadenopathy He also has bilateral hypermetabolic axillary lymph nodes which could be related to metastatic disease versus inflammatory as the patient did have vaccines in both of his arms the week prior to his PET scan.  This was diagnosed in August 2024.   Molecular Studies: His PD-L1 expression was 1% and he has no actionable mutations.  PRIOR THERAPY: 1) Concurrent chemoradiation with carboplatin  for an AUC of 2 and paclitaxel  45 mg/m.  First dose expected on 08/15/23.  Status post 5 cycles of first cycle was given with carboplatin  and paclitaxel  and starting from cycle #2 he was on carboplatin  and Abraxane  secondary to hypersensitivity reaction to paclitaxel . 2) Consolidation immunotherapy with Imfinzi  1500 Mg IV every 4 weeks.  First dose November 28, 2023.  Status post 2 cycles.  Discontinued secondary to intolerance and patient is requested  CURRENT THERAPY: First-line systemic chemotherapy with carboplatin  for AUC of 4, Alimta  400 Mg/M2 and Keytruda  200 Mg IV every 3 weeks. First dose June 20, 2024. Starting cycle #2 Keytruda  will be discontinued secondary to intolerance with significant itching and patient's preference. He is status post 4 cycles.   INTERVAL HISTORY: ARAN MENNING 83 y.o. male returns to the clinic today for a follow-up visit. The patient was last seen in the clinic 3 weeks ago.  Overall since last being seen he presented to the emergency room on 10/06/2024 with a chief complaint of fatigue and chest pain.  Also has been having right calf pain.  He is on Eliquis .  Despite the fact that his last infusion he only received single agent dose reduced Alimta  400 mg/m, the  patient did experience pancytopenia.    In the interval since last being seen, he was found to have bacteremia with staph luggdunesis.  He had a PICC line placed for 6 weeks of antibiotics.  He is treated for cellulitis with bacteremia.  His Eliquis  was on hold due to cytopenias. ***give permission to hold.   Is being discharged in the hospital he is feeling ***.  He denies any fever, chills, or night sweats.  Energy?  He denies any current upper respiratory infection, skin infections, burning with urination, or changes in cough.  He denies any malodorous urine or dysuria.  His appetite and his weight is ***.  He does experience shortness of breath with exertion such as showering or walking from room to room which improves with rest.  Denies any hemoptysis or chest pain.  He denies any nausea, vomiting, diarrhea, or constipation.  He sometimes has headaches in the frontal region which resolved spontaneously without any medication.  Denies any visible bleeding or bruising.  He is here today for evaluation and repeat blood work before undergoing cycle #6.  MEDICAL HISTORY: Past Medical History:  Diagnosis Date   AICD (automatic cardioverter/defibrillator) present    CAD (coronary artery disease) 80% stenosis diag of the LAD, 30% in OM2 branch of LCX in 2009    a. Nonobstructive CAD by cath 11/2011 with the exception of the pre-existing diagonal branch #2 lesion.   Chronic systolic CHF (congestive heart failure) (HCC)    CKD (chronic kidney disease) stage 3, GFR 30-59 ml/min (HCC)    Colon polyp, hyperplastic  History of kidney stones    History of radiation therapy    Left Lung- 08/17/23-09/29/23- Dr. Lynwood Nasuti   History of stress test 06/01/2012   Normal myocardial perfusion study. compared to the previous study there is no significant change. this is a low risk scan   Hypertension    Legally blind    both eyes   Myocardial infarction [facility]) 11/22/2011   NICM (nonischemic  cardiomyopathy) (HCC)    a. Remote hx of dilated NICM with EF ranging 20-45%, including normal EF by echo (55-60%) in 2014.   Peripheral arterial disease    a. 06/2014: ABI right 0.99, left 1.2, LE dopplers revealing an occluded right posterior tibial. As symptoms were not felt r/t claudication, no further w/u at the time.   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 Requiring discontinuation of BB dose.   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  MEDICATIONS:  Current Outpatient Medications  Medication Sig Dispense Refill   [Paused] apixaban  (ELIQUIS ) 2.5 MG TABS tablet Take 1 tablet (2.5 mg total) by mouth 2 (two) times daily. 180 tablet 1   BREO ELLIPTA  100-25 MCG/INH AEPB Inhale 1 puff into the lungs every morning.     brimonidine  (ALPHAGAN ) 0.2 % ophthalmic solution Place 1 drop into both eyes 2 (two) times daily.     ceFAZolin  (ANCEF ) IVPB Inject 2 g into the vein every 12 (twelve) hours. Indication:  Staph lugdunesis bacteremia + ICD First Dose: Yes Last Day of Therapy:  11/20/23 Labs - Once weekly:  CBC/D and BMP, Labs - Once weekly: ESR and CRP Method of administration: IV Push Method of administration may be changed at the discretion of home infusion pharmacist based upon assessment of the patient and/or caregiver's ability to self-administer the medication ordered. 80 Units 0   Continuous Glucose Sensor (FREESTYLE LIBRE 3 PLUS SENSOR) MISC SMARTSIG:Topical Every 3 Months     cycloSPORINE  (RESTASIS ) 0.05 % ophthalmic emulsion Place 1 drop into both eyes 2 (two) times daily.     dorzolamide -timolol  (COSOPT ) 22.3-6.8 MG/ML ophthalmic solution Place 1 drop into both eyes 2 (two) times daily.     ENTRESTO  24-26 MG TAKE 1 TABLET BY MOUTH TWICE DAILY 180 tablet 3    folic acid  (FOLVITE ) 1 MG tablet Take 1 tablet (1 mg total) by mouth daily. Start 7 days before pemetrexed  chemotherapy. Continue until 21 days after pemetrexed  completed. 100 tablet 3   furosemide  (LASIX ) 20 MG tablet Take 0.5 tablets (10 mg total) by mouth daily. 30 tablet 1   Glucagon , rDNA, (GLUCAGON  EMERGENCY) 1 MG KIT Inject 1 mg into the skin as needed for up to 2 doses (Severe low blood sugar). 1 kit 0   hydrocortisone  1 % lotion Apply 1 Application topically 2 (two) times daily. 113 g 0   insulin  isophane & regular human KwikPen (HUMULIN 70/30 MIX) (70-30) 100 UNIT/ML KwikPen Inject 25 Units into the skin in the morning and at bedtime. (Patient not taking: Reported on 10/06/2024) 15 mL 0   isosorbide  mononitrate (IMDUR ) 30 MG 24 hr tablet Take 30 mg by mouth daily.     lidocaine  (LIDODERM ) 5 % Place 1 patch onto the skin daily. Remove & Discard patch within 12 hours or as directed by MD 30 patch 0   metoprolol   succinate (TOPROL -XL) 25 MG 24 hr tablet Take 1 tablet (25 mg total) by mouth daily. 90 tablet 3   omeprazole  (PRILOSEC) 20 MG capsule Take 1 capsule (20 mg total) by mouth daily. 30 capsule 2   ondansetron  (ZOFRAN ) 8 MG tablet Take 1 tablet (8 mg total) by mouth every 8 (eight) hours as needed for nausea or vomiting. Start on the third day after carboplatin . 30 tablet 1   RHOPRESSA 0.02 % SOLN Apply 1 drop to eye every evening.     ROCKLATAN  0.02-0.005 % SOLN Place 1 drop into both eyes at bedtime.     spironolactone  (ALDACTONE ) 25 MG tablet Take 0.5 tablets (12.5 mg total) by mouth daily. 45 tablet 3   VYZULTA  0.024 % SOLN Apply 1 drop to eye at bedtime.     No current facility-administered medications for this visit.    SURGICAL HISTORY:  Past Surgical History:  Procedure Laterality Date   BACK SURGERY     BIV UPGRADE N/A 11/08/2019   Procedure: BIV UPGRADE;  Surgeon: Waddell Danelle ORN, MD;  Location: [facility] INVASIVE CV LAB;  Service: Cardiovascular;  Laterality: N/A;   BRONCHIAL  BIOPSY  07/05/2023   Procedure: BRONCHIAL BIOPSIES;  Surgeon: Shelah Lamar RAMAN, MD;  Location: [facility] ENDOSCOPY;  Service: Pulmonary;;   BRONCHIAL BRUSHINGS  07/05/2023   Procedure: BRONCHIAL BRUSHINGS;  Surgeon: Shelah Lamar RAMAN, MD;  Location: [facility] ENDOSCOPY;  Service: Pulmonary;;   BRONCHIAL NEEDLE ASPIRATION BIOPSY  07/05/2023   Procedure: BRONCHIAL NEEDLE ASPIRATION BIOPSIES;  Surgeon: Shelah Lamar RAMAN, MD;  Location: [facility] ENDOSCOPY;  Service: Pulmonary;;   BRONCHIAL WASHINGS  07/05/2023   Procedure: BRONCHIAL WASHINGS;  Surgeon: Shelah Lamar RAMAN, MD;  Location: [facility] ENDOSCOPY;  Service: Pulmonary;;   CARDIAC CATHETERIZATION  11/2011   CARDIAC CATHETERIZATION  11/2011   didn't demonstrate high grade obstructive disease to account for his LV dysfunction.   CATARACT EXTRACTION, BILATERAL  1990's   CYSTOSCOPY     CYSTOSCOPY WITH BIOPSY Bilateral 08/03/2021   Procedure: CYSTOSCOPY WITH BLADDER BIOPSY, BILATERAL RETROGRADE PYELOGRAM;  Surgeon: Carolee Sherwood JONETTA DOUGLAS, MD;  Location: [facility] ORS;  Service: Urology;  Laterality: Bilateral;  REQUESTING 45 MINS   CYSTOSCOPY WITH URETHRAL DILATATION N/A 05/04/2013   Procedure: CYSTOSCOPY WITH URETHRAL DILATATION;  Surgeon: Alm RAMAN Molt, MD;  Location: [facility] NEURO ORS;  Service: Neurosurgery;  Laterality: N/A;  with insertion of foley catheter   EP study and ablation of VT  7/13   PVC focus mapped to the right coronary cusp of the aorta, limited ablation performed due to proximity of the focus to the right coronary artery   EYE SURGERY     FINE NEEDLE ASPIRATION  07/05/2023   Procedure: FINE NEEDLE ASPIRATION (FNA) LINEAR;  Surgeon: Shelah Lamar RAMAN, MD;  Location: [facility] ENDOSCOPY;  Service: Pulmonary;;   LEFT HEART CATH AND CORONARY ANGIOGRAPHY N/A 11/07/2019   Procedure: LEFT HEART CATH AND CORONARY ANGIOGRAPHY;  Surgeon: Darron Deatrice LABOR, MD;  Location: [facility] INVASIVE CV LAB;  Service: Cardiovascular;  Laterality: N/A;   LEFT HEART CATHETERIZATION WITH CORONARY ANGIOGRAM N/A 11/25/2011    Procedure: LEFT HEART CATHETERIZATION WITH CORONARY ANGIOGRAM;  Surgeon: Alm ORN Clay, MD;  Location: [facility] CATH LAB;  Service: Cardiovascular;  Laterality: N/A;   PACEMAKER IMPLANT N/A 07/12/2018   Procedure: PACEMAKER IMPLANT;  Surgeon: Francyne Headland, MD;  Location: [facility] INVASIVE CV LAB;  Service: Cardiovascular;  Laterality: N/A;   POSTERIOR FUSION LUMBAR SPINE  1979   ROTATOR CUFF REPAIR  2000's  left   TRANSESOPHAGEAL ECHOCARDIOGRAM (CATH LAB) N/A 10/11/2024   Procedure: TRANSESOPHAGEAL ECHOCARDIOGRAM;  Surgeon: Francyne Headland, MD;  Location: [facility] INVASIVE CV LAB;  Service: Cardiovascular;  Laterality: N/A;   V-TACH ABLATION N/A 06/06/2012   Procedure: V-TACH ABLATION;  Surgeon: Lynwood Rakers, MD;  Location: [facility] CATH LAB;  Service: Cardiovascular;  Laterality: N/A;   VIDEO BRONCHOSCOPY WITH ENDOBRONCHIAL ULTRASOUND N/A 07/05/2023   Procedure: VIDEO BRONCHOSCOPY WITH ENDOBRONCHIAL ULTRASOUND;  Surgeon: Shelah Lamar RAMAN, MD;  Location: [facility] ENDOSCOPY;  Service: Pulmonary;  Laterality: N/A;    REVIEW OF SYSTEMS:   Review of Systems  Constitutional: Negative for appetite change, chills, fatigue, fever and unexpected weight change.  HENT:   Negative for mouth sores, nosebleeds, sore throat and trouble swallowing.   Eyes: Negative for eye problems and icterus.  Respiratory: Negative for cough, hemoptysis, shortness of breath and wheezing.   Cardiovascular: Negative for chest pain and leg swelling.  Gastrointestinal: Negative for abdominal pain, constipation, diarrhea, nausea and vomiting.  Genitourinary: Negative for bladder incontinence, difficulty urinating, dysuria, frequency and hematuria.   Musculoskeletal: Negative for back pain, gait problem, neck pain and neck stiffness.  Skin: Negative for itching and rash.  Neurological: Negative for dizziness, extremity weakness, gait problem, headaches, light-headedness and seizures.  Hematological: Negative for adenopathy. Does not bruise/bleed  easily.  Psychiatric/Behavioral: Negative for confusion, depression and sleep disturbance. The patient is not nervous/anxious.     PHYSICAL EXAMINATION:  There were no vitals taken for this visit.  ECOG PERFORMANCE STATUS: {CHL ONC ECOG [id number]  Physical Exam  Constitutional: Oriented to person, place, and time and well-developed, well-nourished, and in no distress. No distress.  HENT:  Head: Normocephalic and atraumatic.  Mouth/Throat: Oropharynx is clear and moist. No oropharyngeal exudate.  Eyes: Conjunctivae are normal. Right eye exhibits no discharge. Left eye exhibits no discharge. No scleral icterus.  Neck: Normal range of motion. Neck supple.  Cardiovascular: Normal rate, regular rhythm, normal heart sounds and intact distal pulses.   Pulmonary/Chest: Effort normal and breath sounds normal. No respiratory distress. No wheezes. No rales.  Abdominal: Soft. Bowel sounds are normal. Exhibits no distension and no mass. There is no tenderness.  Musculoskeletal: Normal range of motion. Exhibits no edema.  Lymphadenopathy:    No cervical adenopathy.  Neurological: Alert and oriented to person, place, and time. Exhibits normal muscle tone. Gait normal. Coordination normal.  Skin: Skin is warm and dry. No rash noted. Not diaphoretic. No erythema. No pallor.  Psychiatric: Mood, memory and judgment normal.  Vitals reviewed.  LABORATORY DATA: Lab Results  Component Value Date   WBC 6.3 10/12/2024   HGB 8.2 (L) 10/12/2024   HCT 25.6 (L) 10/12/2024   MCV 98.5 10/12/2024   PLT 45 (L) 10/12/2024      Chemistry      Component Value Date/Time   NA 142 10/12/2024 0556   NA 142 11/11/2022 1000   K 3.1 (L) 10/12/2024 0556   CL 111 10/12/2024 0556   CO2 22 10/12/2024 0556   BUN 9 10/12/2024 0556   BUN 18 11/11/2022 1000   CREATININE 0.95 10/12/2024 0556   CREATININE 1.13 09/27/2024 1048   CREATININE 1.37 (H) 01/24/2017 0950      Component Value Date/Time   CALCIUM  8.9  10/12/2024 0556   ALKPHOS 43 10/06/2024 1432   AST 19 10/06/2024 1432   AST 17 09/27/2024 1048   ALT 8 10/06/2024 1432   ALT 8 09/27/2024 1048   BILITOT 0.8 10/06/2024 1432   BILITOT  0.5 09/27/2024 1048       RADIOGRAPHIC STUDIES:  US  EKG SITE RITE Result Date: 10/11/2024 If Site Rite image not attached, placement could not be confirmed due to current cardiac rhythm.  ECHO TEE Result Date: 10/11/2024    TRANSESOPHOGEAL ECHO REPORT   Patient Name:   HERO KULISH Date of Exam: 10/11/2024 Medical Rec #:  [id number]        Height:       67.0 in Accession #:    [id number]       Weight:       149.0 lb Date of Birth:  1941-11-13         BSA:          1.784 m Patient Age:    83 years         BP:           119/67 mmHg Patient Gender: M                HR:           74 bpm. Exam Location:  Inpatient Procedure: Transesophageal Echo, Cardiac Doppler and Color Doppler (Both            Spectral and Color Flow Doppler were utilized during procedure). Indications:     Subacute bacterial endocarditis;  History:         Patient has prior history of Echocardiogram examinations, most                  recent 10/09/2024. Abnormal ECG and Defibrillator, COPD,                  Arrythmias:Atrial Fibrillation and NSVT,                  Signs/Symptoms:Bacteremia; Risk Factors:Hypertension. Chemo.  Sonographer:     Ellouise Mose RDCS Referring Phys:  [id number] ZANE ADAMS Diagnosing Phys: Jerel Balding MD PROCEDURE: After discussion of the risks and benefits of a TEE, an informed consent was obtained from the patient. The transesophogeal probe was passed without difficulty through the esophogus of the patient. Imaged were obtained with the patient in a left lateral decubitus position. Sedation performed by different physician. The patient was monitored while under deep sedation. Anesthestetic sedation was provided intravenously by Anesthesiology: 170mg  of Propofol , 100mg  of Lidocaine . The patient's vital signs; including heart  rate, blood pressure, and oxygen saturation; remained stable throughout the procedure. The patient developed no complications during the procedure.  IMPRESSIONS  1. Left ventricular ejection fraction, by estimation, is 45 to 50%. The left ventricle has mildly decreased function. The left ventricle has no regional wall motion abnormalities. There is mild concentric left ventricular hypertrophy.  2. Right ventricular systolic function is normal. The right ventricular size is normal. Moderately increased right ventricular wall thickness. Tricuspid regurgitation signal is inadequate for assessing PA pressure. The estimated right ventricular systolic pressure is 19.4 mmHg.  3. Left atrial size was mildly dilated. No left atrial/left atrial appendage thrombus was detected.  4. The mitral valve is normal in structure. No evidence of mitral valve regurgitation. No evidence of mitral stenosis.  5. The aortic valve is tricuspid. There is mild thickening of the aortic valve. Aortic valve regurgitation is not visualized. Aortic valve sclerosis is present, with no evidence of aortic valve stenosis.  6. There is mild (Grade II) protruding plaque involving the descending aorta. Conclusion(s)/Recommendation(s): No evidence of vegetation/infective endocarditis on this transesophageael echocardiogram. FINDINGS  Left Ventricle: Left ventricular  ejection fraction, by estimation, is 45 to 50%. The left ventricle has mildly decreased function. The left ventricle has no regional wall motion abnormalities. The left ventricular internal cavity size was normal in size. There is mild concentric left ventricular hypertrophy. Right Ventricle: The right ventricular size is normal. Moderately increased right ventricular wall thickness. Right ventricular systolic function is normal. Tricuspid regurgitation signal is inadequate for assessing PA pressure. The tricuspid regurgitant  velocity is 1.90 m/s, and with an assumed right atrial pressure of 5  mmHg, the estimated right ventricular systolic pressure is 19.4 mmHg. Left Atrium: Left atrial size was mildly dilated. No left atrial/left atrial appendage thrombus was detected. Right Atrium: Right atrial size was normal in size. Pericardium: There is no evidence of pericardial effusion. Mitral Valve: The mitral valve is normal in structure. No evidence of mitral valve regurgitation. No evidence of mitral valve stenosis. Tricuspid Valve: The tricuspid valve is normal in structure. Tricuspid valve regurgitation is not demonstrated. Aortic Valve: The aortic valve is tricuspid. There is mild thickening of the aortic valve. Aortic valve regurgitation is not visualized. Aortic valve sclerosis is present, with no evidence of aortic valve stenosis. Pulmonic Valve: The pulmonic valve was normal in structure. Pulmonic valve regurgitation is not visualized. No evidence of pulmonic stenosis. Aorta: The aortic root and ascending aorta are structurally normal, with no evidence of dilitation. There is mild (Grade II) protruding plaque involving the descending aorta. IAS/Shunts: No atrial level shunt detected by color flow Doppler. Additional Comments: A device lead is visualized in the right ventricle, right atrium and superior vena cava. Spectral Doppler performed. TRICUSPID VALVE TR Peak grad:   14.4 mmHg TR Vmax:        190.00 cm/s Jerel Balding MD Electronically signed by Jerel Balding MD Signature Date/Time: 10/11/2024/2:13:42 PM    Final    EP STUDY Result Date: 10/11/2024 See surgical note for result.  ECHOCARDIOGRAM COMPLETE Result Date: 10/09/2024    ECHOCARDIOGRAM REPORT   Patient Name:   MONTARIUS KITAGAWA Date of Exam: 10/09/2024 Medical Rec #:  [id number]        Height:       67.0 in Accession #:    [id number]       Weight:       149.0 lb Date of Birth:  1941-04-03         BSA:          1.784 m Patient Age:    83 years         BP:           101/58 mmHg Patient Gender: M                HR:           100 bpm.  Exam Location:  Inpatient Procedure: 2D Echo (Both Spectral and Color Flow Doppler were utilized during            procedure). Indications:    Endocarditis  History:        Patient has prior history of Echocardiogram examinations.                 Pacemaker; Signs/Symptoms:Bacteremia.  Sonographer:    Norleen Amour Referring Phys: [id number] [facility] IMPRESSIONS  1. NO obvious vegetations though cannot completely exclude If clinically indicated, recommend TEE.  2. HYpokinesis of the lateral wall (base, mid). Left ventricular ejection fraction, by estimation, is 45 to 50%. The left ventricle has mildly decreased function. Left ventricular diastolic  parameters are consistent with Grade I diastolic dysfunction (impaired relaxation).  3. Right ventricular systolic function is normal. The right ventricular size is normal. There is normal pulmonary artery systolic pressure.  4. Left atrial size was moderately dilated.  5. The mitral valve is normal in structure. Mild mitral valve regurgitation.  6. The aortic valve is tricuspid. Aortic valve regurgitation is not visualized. Aortic valve sclerosis/calcification is present, without any evidence of aortic stenosis.  7. The inferior vena cava is normal in size with greater than 50% respiratory variability, suggesting right atrial pressure of 3 mmHg. FINDINGS  Left Ventricle: HYpokinesis of the lateral wall (base, mid). Left ventricular ejection fraction, by estimation, is 45 to 50%. The left ventricle has mildly decreased function. The left ventricular internal cavity size was normal in size. There is no left ventricular hypertrophy. Left ventricular diastolic parameters are consistent with Grade I diastolic dysfunction (impaired relaxation). Right Ventricle: The right ventricular size is normal. Right vetricular wall thickness was not assessed. Right ventricular systolic function is normal. There is normal pulmonary artery systolic pressure. The tricuspid regurgitant  velocity is 2.87 m/s, and with an assumed right atrial pressure of 3 mmHg, the estimated right ventricular systolic pressure is 35.9 mmHg. Left Atrium: Left atrial size was moderately dilated. Right Atrium: Right atrial size was normal in size. Pericardium: Trivial pericardial effusion is present. Mitral Valve: The mitral valve is normal in structure. Mild mitral valve regurgitation. MV peak gradient, 4.0 mmHg. The mean mitral valve gradient is 1.0 mmHg. Tricuspid Valve: The tricuspid valve is normal in structure. Tricuspid valve regurgitation is mild. Aortic Valve: The aortic valve is tricuspid. Aortic valve regurgitation is not visualized. Aortic valve sclerosis/calcification is present, without any evidence of aortic stenosis. Pulmonic Valve: The pulmonic valve was normal in structure. Pulmonic valve regurgitation is not visualized. Aorta: The aortic root and ascending aorta are structurally normal, with no evidence of dilitation. Venous: The inferior vena cava is normal in size with greater than 50% respiratory variability, suggesting right atrial pressure of 3 mmHg. IAS/Shunts: No atrial level shunt detected by color flow Doppler. Additional Comments: A device lead is visualized.  LEFT VENTRICLE PLAX 2D LVIDd:         4.60 cm     Diastology LVIDs:         3.60 cm     LV e' medial:    6.85 cm/s LV PW:         1.10 cm     LV E/e' medial:  15.8 LV IVS:        0.80 cm     LV e' lateral:   9.36 cm/s LVOT diam:     2.20 cm     LV E/e' lateral: 11.5 LV SV:         48 LV SV Index:   27 LVOT Area:     3.80 cm LV IVRT:       90 msec  LV Volumes (MOD) LV vol d, MOD A2C: 84.1 ml LV vol d, MOD A4C: 93.6 ml LV vol s, MOD A2C: 44.2 ml LV vol s, MOD A4C: 51.2 ml LV SV MOD A2C:     39.9 ml LV SV MOD A4C:     93.6 ml LV SV MOD BP:      43.3 ml RIGHT VENTRICLE             IVC RV Basal diam:  3.50 cm     IVC diam: 1.50 cm RV S prime:  16.10 cm/s TAPSE (M-mode): 2.5 cm      PULMONARY VEINS                             Diastolic  Velocity: 72.40 cm/s                             S/D Velocity:       0.80                             Systolic Velocity:  57.80 cm/s LEFT ATRIUM             Index        RIGHT ATRIUM           Index LA Vol (A2C):   68.0 ml 38.11 ml/m  RA Area:     18.50 cm LA Vol (A4C):   78.5 ml 43.99 ml/m  RA Volume:   44.80 ml  25.11 ml/m LA Biplane Vol: 74.2 ml 41.58 ml/m  AORTIC VALVE             PULMONIC VALVE LVOT Vmax:   73.10 cm/s  PV Vmax:       1.11 m/s LVOT Vmean:  45.400 cm/s PV Peak grad:  4.9 mmHg LVOT VTI:    0.127 m  AORTA Ao Root diam: 2.70 cm Ao Asc diam:  3.00 cm MITRAL VALVE                TRICUSPID VALVE MV Area (PHT): 4.46 cm     TR Peak grad:   32.9 mmHg MV Area VTI:   2.04 cm     TR Vmax:        287.00 cm/s MV Peak grad:  4.0 mmHg MV Mean grad:  1.0 mmHg     SHUNTS MV Vmax:       1.00 m/s     Systemic VTI:  0.13 m MV Vmean:      54.9 cm/s    Systemic Diam: 2.20 cm MV Decel Time: 170 msec MV E velocity: 108.00 cm/s MV A velocity: 52.30 cm/s MV E/A ratio:  2.07 Vina Gull MD Electronically signed by Vina Gull MD Signature Date/Time: 10/09/2024/1:01:31 PM    Final    VAS US  LOWER EXTREMITY VENOUS (DVT) (ONLY [facility] & [facility]) Result Date: 10/07/2024  Lower Venous DVT Study Patient Name:  KAIS MONJE  Date of Exam:   10/06/2024 Medical Rec #: [id number]         Accession #:    [id number] Date of Birth: 1941-07-03          Patient Gender: M Patient Age:   19 years Exam Location:  [facility] Procedure:      VAS US  LOWER EXTREMITY VENOUS (DVT) Referring Phys: MATTHEW TRIFAN --------------------------------------------------------------------------------  Indications: Pain, and Darkened erythema along lateral lower leg.  Risk Factors: Cancer: Primary adenocarcinoma of upper lobe of left lung, stage IIIC/IV, on chemotherapy Paroxysmal atrial fibrillation, ICD. Limitations: Pain with compression maneuvers in calf. Comparison Study: Prior negative right LEV done 02/26/16 Performing Technologist: Alberta Lis RVS  Examination Guidelines: A complete evaluation includes B-mode imaging, spectral Doppler, color Doppler, and power Doppler as needed of all accessible portions of each vessel. Bilateral testing is considered an integral part of a complete examination. Limited examinations for reoccurring indications may be performed as noted.  The reflux portion of the exam is performed with the patient in reverse Trendelenburg.  +---------+---------------+---------+-----------+----------+---------------+ RIGHT    CompressibilityPhasicitySpontaneityPropertiesThrombus Aging  +---------+---------------+---------+-----------+----------+---------------+ CFV      Full           Yes      Yes                                  +---------+---------------+---------+-----------+----------+---------------+ SFJ      Full                                                         +---------+---------------+---------+-----------+----------+---------------+ FV Prox  Full           Yes      Yes                                  +---------+---------------+---------+-----------+----------+---------------+ FV Mid   Full                                                         +---------+---------------+---------+-----------+----------+---------------+ FV DistalFull                                                         +---------+---------------+---------+-----------+----------+---------------+ PFV      Full           Yes      Yes                                  +---------+---------------+---------+-----------+----------+---------------+ POP      Full           Yes      Yes                                  +---------+---------------+---------+-----------+----------+---------------+ PTV                                                   patent by color +---------+---------------+---------+-----------+----------+---------------+ PERO                                                   patent by color +---------+---------------+---------+-----------+----------+---------------+   +----+---------------+---------+-----------+----------+--------------+ LEFTCompressibilityPhasicitySpontaneityPropertiesThrombus Aging +----+---------------+---------+-----------+----------+--------------+ CFV Full           Yes      Yes                                 +----+---------------+---------+-----------+----------+--------------+  SFJ Full                                                        +----+---------------+---------+-----------+----------+--------------+     Summary: RIGHT: - There is no evidence of deep vein thrombosis in the lower extremity.  - Ultrasound characteristics of enlarged lymph nodes are noted in the groin.  LEFT: - No evidence of common femoral vein obstruction.  - Ultrasound characteristics of enlarged lymph nodes noted in the groin.  *See table(s) above for measurements and observations. Electronically signed by Penne Colorado MD on 10/07/2024 at 10:44:45 AM.    Final    DG Chest 2 View Result Date: 10/06/2024 EXAM: 2 VIEW(S) XRAY OF THE CHEST 10/06/2024 02:13:00 PM COMPARISON: 07/12/2024 CLINICAL HISTORY: chest pain FINDINGS: LINES, TUBES AND DEVICES: Stable left subclavian AICD. LUNGS AND PLEURA: Persistent lingular focal masslike consolidation. No pleural effusion. No pneumothorax. HEART AND MEDIASTINUM: AP window is obscured, suggesting mediastinal adenopathy as before. No acute abnormality of the cardiac silhouette. BONES AND SOFT TISSUES: Thoracolumbar fixation hardware partially visualized without acute finding. No acute osseous abnormality. IMPRESSION: 1. Persistent lingular focal masslike consolidation, unchanged from prior study. 2. AP window is obscured suggesting mediastinal adenopathy, unchanged from prior study. Electronically signed by: Dayne Hassell MD 10/06/2024 02:20 PM EST RP Workstation: [id number]     ASSESSMENT/PLAN:  This is a very  pleasant 83 year old African-American male with suspicious stage IIIC/IV (T3, N3, M0) non-small cell lung cancer, adenocarcinoma. He presented with a left upper lobe lung mass and lesion in the lingula.  He also has bilateral mediastinal lymph nodes and hypermetabolic bilateral axillary lymph nodes.  Although it is unclear if the axillary lymph nodes are related to metastatic disease or inflammatory as he did have bilateral vaccines in his upper arms just prior to his PET scan and his axillary biopsy was negative. He was He was diagnosed in August 2024.    His molecular studies show no actionable mutations and his PD-L1 expression is 1%   He started a course of concurrent chemoradiation initially with carboplatin  for AUC of 2 and paclitaxel  45 Mg/M2 but paclitaxel  was the changed to Abraxane  starting from cycle #2 secondary to hypersensitivity reaction.  The patient received 5 cycles of concurrent chemoradiation.  He tolerated this treatment fairly well except for fatigue.   The patient then underwent consolidation immunotherapy with Imfinzi  1500 mg IV every 4 weeks.  He status post 2 cycles.  He developed significant itching and rash he decided to stop treatment at that time and the last dose was given in February 2025.   In July 2025 he started having evidence of disease recurrence in July 2025.  Therefore Dr. Sherrod started him on palliative systemic chemotherapy with carboplatin  for an AUC of 4 and Alimta  400 mg/m.  He is status post 4 cycles.  The patient refused Keytruda  due to prior intolerance with immunotherapy.  Starting from cycle #5 he started single agent maintenance dose reduced Alimta  400 mg/m.  Despite being on dose reduced Alimta  400 mg/m, the patient developed pancytopenia with treatment requiring supportive care with G-CSF injections, platelet transfusion, and blood transfusion.  The patient was seen with Dr. Sherrod today.  Dr. Sherrod had a lengthy discussion with the patient  about his current condition and treatment options.  The patient had previously refused  immunotherapy.  He has poor tolerance to chemotherapy though and if he is not able to tolerate single agent dose reduced Alimta  he likely would have poor tolerance to other chemotherapy agents.  Therefore***  Labs were reviewed.  Recommend that he***cycle 6 today  I will arrange for weekly labs.  We will see him back for labs and a follow-up visit in 3 weeks before undergoing cycle #7.  Sample blood bank and blood transfusion?  ***scan  The patient was advised to call immediately if she has any concerning symptoms in the interval. The patient voices understanding of current disease status and treatment options and is in agreement with the current care plan. All questions were answered. The patient knows to call the clinic with any problems, questions or concerns. We can certainly see the patient much sooner if necessary  No orders of the defined types were placed in this encounter.    I spent {CHL ONC TIME VISIT - DTPQU:[id number]} counseling the patient face to face. The total time spent in the appointment was {CHL ONC TIME VISIT - DTPQU:[id number]}.  Madelina Sanda L Lynann Demetrius, PA-C 10/15/24

## 2024-10-17 ENCOUNTER — Other Ambulatory Visit: Payer: Self-pay | Admitting: Physician Assistant

## 2024-10-17 ENCOUNTER — Inpatient Hospital Stay

## 2024-10-17 ENCOUNTER — Other Ambulatory Visit: Payer: Self-pay

## 2024-10-17 ENCOUNTER — Inpatient Hospital Stay: Admitting: Physician Assistant

## 2024-10-17 ENCOUNTER — Telehealth: Payer: Self-pay | Admitting: Internal Medicine

## 2024-10-17 DIAGNOSIS — C3412 Malignant neoplasm of upper lobe, left bronchus or lung: Secondary | ICD-10-CM

## 2024-10-17 NOTE — Telephone Encounter (Signed)
 Scheduled patient for labs and follow-up on 12/2. Patient missed his appointments on 11/26, he was not aware of the appointments. I called him and let him know I was scheduling him to come in on 12/2 and he is aware of day and time.

## 2024-10-17 NOTE — Progress Notes (Signed)
 Called patient due to not showing up for his appointment today for labs at 1300 and Cassie PAC at 1330 today. Patient stated he forgot and we will send a message to scheduling to have him rescheduled to next week. Patient voiced understanding.

## 2024-10-22 ENCOUNTER — Other Ambulatory Visit: Payer: Self-pay | Admitting: Physician Assistant

## 2024-10-22 DIAGNOSIS — K219 Gastro-esophageal reflux disease without esophagitis: Secondary | ICD-10-CM

## 2024-10-23 ENCOUNTER — Inpatient Hospital Stay: Admitting: Internal Medicine

## 2024-10-23 ENCOUNTER — Inpatient Hospital Stay: Attending: Internal Medicine

## 2024-10-23 VITALS — BP 141/73 | HR 80 | Temp 97.6°F | Resp 17 | Ht 67.0 in | Wt 146.0 lb

## 2024-10-23 DIAGNOSIS — A412 Sepsis due to unspecified staphylococcus: Secondary | ICD-10-CM | POA: Diagnosis not present

## 2024-10-23 DIAGNOSIS — Z923 Personal history of irradiation: Secondary | ICD-10-CM | POA: Insufficient documentation

## 2024-10-23 DIAGNOSIS — Z87442 Personal history of urinary calculi: Secondary | ICD-10-CM | POA: Diagnosis not present

## 2024-10-23 DIAGNOSIS — D649 Anemia, unspecified: Secondary | ICD-10-CM | POA: Diagnosis not present

## 2024-10-23 DIAGNOSIS — Z9842 Cataract extraction status, left eye: Secondary | ICD-10-CM | POA: Diagnosis not present

## 2024-10-23 DIAGNOSIS — C3412 Malignant neoplasm of upper lobe, left bronchus or lung: Secondary | ICD-10-CM | POA: Diagnosis present

## 2024-10-23 DIAGNOSIS — Z860102 Personal history of hyperplastic colon polyps: Secondary | ICD-10-CM | POA: Insufficient documentation

## 2024-10-23 DIAGNOSIS — N1832 Chronic kidney disease, stage 3b: Secondary | ICD-10-CM | POA: Diagnosis not present

## 2024-10-23 DIAGNOSIS — R590 Localized enlarged lymph nodes: Secondary | ICD-10-CM | POA: Insufficient documentation

## 2024-10-23 DIAGNOSIS — Z9841 Cataract extraction status, right eye: Secondary | ICD-10-CM | POA: Insufficient documentation

## 2024-10-23 DIAGNOSIS — Z7901 Long term (current) use of anticoagulants: Secondary | ICD-10-CM | POA: Insufficient documentation

## 2024-10-23 DIAGNOSIS — Z9226 Personal history of immune checkpoint inhibitor therapy: Secondary | ICD-10-CM | POA: Diagnosis not present

## 2024-10-23 DIAGNOSIS — T7840XA Allergy, unspecified, initial encounter: Secondary | ICD-10-CM | POA: Insufficient documentation

## 2024-10-23 DIAGNOSIS — I2510 Atherosclerotic heart disease of native coronary artery without angina pectoris: Secondary | ICD-10-CM | POA: Diagnosis not present

## 2024-10-23 DIAGNOSIS — I252 Old myocardial infarction: Secondary | ICD-10-CM | POA: Diagnosis not present

## 2024-10-23 DIAGNOSIS — L299 Pruritus, unspecified: Secondary | ICD-10-CM | POA: Insufficient documentation

## 2024-10-23 DIAGNOSIS — L03115 Cellulitis of right lower limb: Secondary | ICD-10-CM | POA: Diagnosis not present

## 2024-10-23 DIAGNOSIS — Z9221 Personal history of antineoplastic chemotherapy: Secondary | ICD-10-CM | POA: Diagnosis not present

## 2024-10-23 DIAGNOSIS — J984 Other disorders of lung: Secondary | ICD-10-CM | POA: Insufficient documentation

## 2024-10-23 DIAGNOSIS — Z5111 Encounter for antineoplastic chemotherapy: Secondary | ICD-10-CM | POA: Insufficient documentation

## 2024-10-23 DIAGNOSIS — Z8701 Personal history of pneumonia (recurrent): Secondary | ICD-10-CM | POA: Insufficient documentation

## 2024-10-23 DIAGNOSIS — H548 Legal blindness, as defined in USA: Secondary | ICD-10-CM | POA: Diagnosis not present

## 2024-10-23 DIAGNOSIS — I130 Hypertensive heart and chronic kidney disease with heart failure and stage 1 through stage 4 chronic kidney disease, or unspecified chronic kidney disease: Secondary | ICD-10-CM | POA: Diagnosis not present

## 2024-10-23 LAB — CBC WITH DIFFERENTIAL (CANCER CENTER ONLY)
Abs Immature Granulocytes: 0.01 K/uL (ref 0.00–0.07)
Basophils Absolute: 0 K/uL (ref 0.0–0.1)
Basophils Relative: 1 %
Eosinophils Absolute: 0.1 K/uL (ref 0.0–0.5)
Eosinophils Relative: 2 %
HCT: 24.8 % — ABNORMAL LOW (ref 39.0–52.0)
Hemoglobin: 8 g/dL — ABNORMAL LOW (ref 13.0–17.0)
Immature Granulocytes: 0 %
Lymphocytes Relative: 12 %
Lymphs Abs: 0.5 K/uL — ABNORMAL LOW (ref 0.7–4.0)
MCH: 32.3 pg (ref 26.0–34.0)
MCHC: 32.3 g/dL (ref 30.0–36.0)
MCV: 100 fL (ref 80.0–100.0)
Monocytes Absolute: 0.5 K/uL (ref 0.1–1.0)
Monocytes Relative: 13 %
Neutro Abs: 2.9 K/uL (ref 1.7–7.7)
Neutrophils Relative %: 72 %
Platelet Count: 151 K/uL (ref 150–400)
RBC: 2.48 MIL/uL — ABNORMAL LOW (ref 4.22–5.81)
RDW: 19.2 % — ABNORMAL HIGH (ref 11.5–15.5)
WBC Count: 4 K/uL (ref 4.0–10.5)
nRBC: 0 % (ref 0.0–0.2)

## 2024-10-23 LAB — CMP (CANCER CENTER ONLY)
ALT: <5 U/L (ref 0–44)
AST: 23 U/L (ref 15–41)
Albumin: 3.3 g/dL — ABNORMAL LOW (ref 3.5–5.0)
Alkaline Phosphatase: 48 U/L (ref 38–126)
Anion gap: 10 (ref 5–15)
BUN: 9 mg/dL (ref 8–23)
CO2: 23 mmol/L (ref 22–32)
Calcium: 8.9 mg/dL (ref 8.9–10.3)
Chloride: 105 mmol/L (ref 98–111)
Creatinine: 0.84 mg/dL (ref 0.61–1.24)
GFR, Estimated: >60 mL/min (ref >60)
Glucose, Bld: 104 mg/dL — ABNORMAL HIGH (ref 70–99)
Potassium: 3.6 mmol/L (ref 3.5–5.1)
Sodium: 138 mmol/L (ref 135–145)
Total Bilirubin: 0.5 mg/dL (ref 0.0–1.2)
Total Protein: 7.4 g/dL (ref 6.5–8.1)

## 2024-10-23 LAB — SAMPLE TO BLOOD BANK

## 2024-10-23 LAB — TSH: TSH: 3.21 u[IU]/mL (ref 0.350–4.500)

## 2024-10-23 NOTE — Progress Notes (Signed)
 [facility] [phone]   [phone]  OFFICE PROGRESS NOTE  Rodney Aland, MD [address] [place] [address]  DIAGNOSIS: Stage IIIC/IV (T3, N3, M0) non-small cell lung cancer, adenocarcinoma. He presented with a left upper lobe spiculated mass in addition to lingular lesion and suspicious mediastinal lymphadenopathy He also has bilateral hypermetabolic axillary lymph nodes which could be related to metastatic disease versus inflammatory as the patient did have vaccines in both of his arms the week prior to his PET scan.  This was diagnosed in August 2024.   Molecular Studies: His PD-L1 expression was 1% and he has no actionable mutations.   PRIOR THERAPY:  1) Concurrent chemoradiation with carboplatin  for an AUC of 2 and paclitaxel  45 mg/m.  First dose expected on 08/15/23.  Status post 5 cycles of first cycle was given with carboplatin  and paclitaxel  and starting from cycle #2 he was on carboplatin  and Abraxane  secondary to hypersensitivity reaction to paclitaxel . 2) Consolidation immunotherapy with Imfinzi  1500 Mg IV every 4 weeks.  First dose November 28, 2023.  Status post 2 cycles.  Discontinued secondary to intolerance and patient is requested.   CURRENT THERAPY: First-line systemic chemotherapy with carboplatin  for AUC of 4, Alimta  400 Mg/M2 and Keytruda  200 Mg IV every 3 weeks.  First dose June 20, 2024.  Starting cycle #2 Keytruda  will be discontinued secondary to intolerance with significant itching and patient's preference.  Status post 5 cycles.  INTERVAL HISTORY: [name] 83 y.o. male returns to the clinic today for follow-up visit. Discussed the use of AI scribe software for clinical note transcription with the patient, who gave verbal consent to proceed.  History of Present Illness [name] is an 83 year old male with metastatic non-small cell lung cancer who presents for evaluation before starting cycle number six of  chemotherapy. He is accompanied by his wife.  He was initially diagnosed with metastatic non-small cell lung cancer in August 2024. He completed a course of concurrent chemoradiation followed by two cycles of consolidation treatment with immunotherapy using durvalumab . Subsequently, he underwent four cycles of chemotherapy with carboplatin  and Alimta . Since cycle number five, he has been on single-agent treatment with Alimta  every three weeks.  Approximately two weeks ago, he was admitted to the hospital with right lower extremity cellulitis and was found to have bacteremia with positive Staphylococcus lugdunensis. He is currently on a six-week course of IV antibiotics with Ancef , administered twice daily. He has completed two weeks of this treatment and has four weeks remaining.  He experiences swelling in his feet and low appetite at times. He is currently taking folic acid  as part of his chemotherapy regimen and has been advised to take iron supplements due to anemia. He does not take multivitamins.      MEDICAL HISTORY: Past Medical History:  Diagnosis Date   AICD (automatic cardioverter/defibrillator) present    CAD (coronary artery disease) 80% stenosis diag of the LAD, 30% in OM2 branch of LCX in 2009    a. Nonobstructive CAD by cath 11/2011 with the exception of the pre-existing diagonal branch #2 lesion.   Chronic systolic CHF (congestive heart failure) (HCC)    CKD (chronic kidney disease) stage 3, GFR 30-59 ml/min (HCC)    Colon polyp, hyperplastic    History of kidney stones    History of radiation therapy    Left Lung- 08/17/23-09/29/23- Dr. Lynwood Nasuti   History of stress test 06/01/2012  Normal myocardial perfusion study. compared to the previous study there is no significant change. this is a low risk scan   Hypertension    Legally blind    both eyes   Myocardial infarction [facility]) 11/22/2011   NICM (nonischemic cardiomyopathy) (HCC)    a. Remote hx of dilated NICM  with EF ranging 20-45%, including normal EF by echo (55-60%) in 2014.   Peripheral arterial disease    a. 06/2014: ABI right 0.99, left 1.2, LE dopplers revealing an occluded right posterior tibial. As symptoms were not felt r/t claudication, no further w/u at the time.   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 Requiring discontinuation of BB dose.   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  MEDICATIONS:  Current Outpatient Medications  Medication Sig Dispense Refill   [Paused] apixaban  (ELIQUIS ) 2.5 MG TABS tablet Take 1 tablet (2.5 mg total) by mouth 2 (two) times daily. 180 tablet 1   BREO ELLIPTA  100-25 MCG/INH AEPB Inhale 1 puff into the lungs every morning.     brimonidine  (ALPHAGAN ) 0.2 % ophthalmic solution Place 1 drop into both eyes 2 (two) times daily.     ceFAZolin  (ANCEF ) IVPB Inject 2 g into the vein every 12 (twelve) hours. Indication:  Staph lugdunesis bacteremia + ICD First Dose: Yes Last Day of Therapy:  11/20/23 Labs - Once weekly:  CBC/D and BMP, Labs - Once weekly: ESR and CRP Method of administration: IV Push Method of administration may be changed at the discretion of home infusion pharmacist based upon assessment of the patient and/or caregiver's ability to self-administer the medication ordered. 80 Units 0   Continuous Glucose Sensor (FREESTYLE LIBRE 3 PLUS SENSOR) MISC SMARTSIG:Topical Every 3 Months     cycloSPORINE  (RESTASIS ) 0.05 % ophthalmic emulsion Place 1 drop into both eyes 2 (two) times daily.     dorzolamide -timolol  (COSOPT ) 22.3-6.8 MG/ML ophthalmic solution Place 1 drop into both eyes 2 (two) times daily.     ENTRESTO  24-26 MG TAKE 1 TABLET BY MOUTH TWICE DAILY 180 tablet 3   folic acid  (FOLVITE ) 1 MG tablet Take 1 tablet (1 mg  total) by mouth daily. Start 7 days before pemetrexed  chemotherapy. Continue until 21 days after pemetrexed  completed. 100 tablet 3   furosemide  (LASIX ) 20 MG tablet Take 0.5 tablets (10 mg total) by mouth daily. 30 tablet 1   Glucagon , rDNA, (GLUCAGON  EMERGENCY) 1 MG KIT Inject 1 mg into the skin as needed for up to 2 doses (Severe low blood sugar). 1 kit 0   hydrocortisone  1 % lotion Apply 1 Application topically 2 (two) times daily. 113 g 0   insulin  isophane & regular human KwikPen (HUMULIN 70/30 MIX) (70-30) 100 UNIT/ML KwikPen Inject 25 Units into the skin in the morning and at bedtime. 15 mL 0   isosorbide  mononitrate (IMDUR ) 30 MG 24 hr tablet Take 30 mg by mouth daily.     lidocaine  (LIDODERM ) 5 % Place 1 patch onto the skin daily. Remove & Discard patch within 12 hours or as directed by MD 30 patch 0   metoprolol  succinate (TOPROL -XL) 25 MG 24 hr tablet Take 1 tablet (25 mg total) by mouth daily. 90 tablet 3   omeprazole  (PRILOSEC) 20 MG capsule TAKE 1 CAPSULE(20 MG) BY MOUTH DAILY 30 capsule  2   ondansetron  (ZOFRAN ) 8 MG tablet Take 1 tablet (8 mg total) by mouth every 8 (eight) hours as needed for nausea or vomiting. Start on the third day after carboplatin . 30 tablet 1   RHOPRESSA 0.02 % SOLN Apply 1 drop to eye every evening.     ROCKLATAN  0.02-0.005 % SOLN Place 1 drop into both eyes at bedtime.     spironolactone  (ALDACTONE ) 25 MG tablet Take 0.5 tablets (12.5 mg total) by mouth daily. 45 tablet 3   VYZULTA  0.024 % SOLN Apply 1 drop to eye at bedtime.     No current facility-administered medications for this visit.    SURGICAL HISTORY:  Past Surgical History:  Procedure Laterality Date   BACK SURGERY     BIV UPGRADE N/A 11/08/2019   Procedure: BIV UPGRADE;  Surgeon: Waddell Danelle ORN, MD;  Location: [facility] INVASIVE CV LAB;  Service: Cardiovascular;  Laterality: N/A;   BRONCHIAL BIOPSY  07/05/2023   Procedure: BRONCHIAL BIOPSIES;  Surgeon: Shelah Lamar RAMAN, MD;  Location: [facility]  ENDOSCOPY;  Service: Pulmonary;;   BRONCHIAL BRUSHINGS  07/05/2023   Procedure: BRONCHIAL BRUSHINGS;  Surgeon: Shelah Lamar RAMAN, MD;  Location: [facility] ENDOSCOPY;  Service: Pulmonary;;   BRONCHIAL NEEDLE ASPIRATION BIOPSY  07/05/2023   Procedure: BRONCHIAL NEEDLE ASPIRATION BIOPSIES;  Surgeon: Shelah Lamar RAMAN, MD;  Location: [facility] ENDOSCOPY;  Service: Pulmonary;;   BRONCHIAL WASHINGS  07/05/2023   Procedure: BRONCHIAL WASHINGS;  Surgeon: Shelah Lamar RAMAN, MD;  Location: [facility] ENDOSCOPY;  Service: Pulmonary;;   CARDIAC CATHETERIZATION  11/2011   CARDIAC CATHETERIZATION  11/2011   didn't demonstrate high grade obstructive disease to account for his LV dysfunction.   CATARACT EXTRACTION, BILATERAL  1990's   CYSTOSCOPY     CYSTOSCOPY WITH BIOPSY Bilateral 08/03/2021   Procedure: CYSTOSCOPY WITH BLADDER BIOPSY, BILATERAL RETROGRADE PYELOGRAM;  Surgeon: Carolee Sherwood JONETTA DOUGLAS, MD;  Location: [facility] ORS;  Service: Urology;  Laterality: Bilateral;  REQUESTING 45 MINS   CYSTOSCOPY WITH URETHRAL DILATATION N/A 05/04/2013   Procedure: CYSTOSCOPY WITH URETHRAL DILATATION;  Surgeon: Alm RAMAN Molt, MD;  Location: [facility] NEURO ORS;  Service: Neurosurgery;  Laterality: N/A;  with insertion of foley catheter   EP study and ablation of VT  7/13   PVC focus mapped to the right coronary cusp of the aorta, limited ablation performed due to proximity of the focus to the right coronary artery   EYE SURGERY     FINE NEEDLE ASPIRATION  07/05/2023   Procedure: FINE NEEDLE ASPIRATION (FNA) LINEAR;  Surgeon: Shelah Lamar RAMAN, MD;  Location: [facility] ENDOSCOPY;  Service: Pulmonary;;   LEFT HEART CATH AND CORONARY ANGIOGRAPHY N/A 11/07/2019   Procedure: LEFT HEART CATH AND CORONARY ANGIOGRAPHY;  Surgeon: Darron Deatrice LABOR, MD;  Location: [facility] INVASIVE CV LAB;  Service: Cardiovascular;  Laterality: N/A;   LEFT HEART CATHETERIZATION WITH CORONARY ANGIOGRAM N/A 11/25/2011   Procedure: LEFT HEART CATHETERIZATION WITH CORONARY ANGIOGRAM;  Surgeon: Alm ORN Clay, MD;   Location: [facility] CATH LAB;  Service: Cardiovascular;  Laterality: N/A;   PACEMAKER IMPLANT N/A 07/12/2018   Procedure: PACEMAKER IMPLANT;  Surgeon: Francyne Headland, MD;  Location: [facility] INVASIVE CV LAB;  Service: Cardiovascular;  Laterality: N/A;   POSTERIOR FUSION LUMBAR SPINE  1979   ROTATOR CUFF REPAIR  2000's   left   TRANSESOPHAGEAL ECHOCARDIOGRAM (CATH LAB) N/A 10/11/2024   Procedure: TRANSESOPHAGEAL ECHOCARDIOGRAM;  Surgeon: Francyne Headland, MD;  Location: [facility] INVASIVE CV LAB;  Service: Cardiovascular;  Laterality: N/A;   V-TACH ABLATION N/A 06/06/2012  Procedure: V-TACH ABLATION;  Surgeon: Lynwood Rakers, MD;  Location[facility] CATH LAB;  Service: Cardiovascular;  Laterality: N/A;   VIDEO BRONCHOSCOPY WITH ENDOBRONCHIAL ULTRASOUND N/A 07/05/2023   Procedure: VIDEO BRONCHOSCOPY WITH ENDOBRONCHIAL ULTRASOUND;  Surgeon: Shelah Lamar RAMAN, MD;  Location: [facility] ENDOSCOPY;  Service: Pulmonary;  Laterality: N/A;    REVIEW OF SYSTEMS:  Constitutional: positive for fatigue Eyes: negative Ears, nose, mouth, throat, and face: negative Respiratory: negative Cardiovascular: negative Gastrointestinal: negative Genitourinary:negative Integument/breast: negative Hematologic/lymphatic: negative Musculoskeletal:negative Neurological: negative Behavioral/Psych: negative Endocrine: negative Allergic/Immunologic: negative   PHYSICAL EXAMINATION: General appearance: alert, cooperative, fatigued, and no distress Head: Normocephalic, without obvious abnormality, atraumatic Neck: no adenopathy, no JVD, supple, symmetrical, trachea midline, and thyroid  not enlarged, symmetric, no tenderness/mass/nodules Lymph nodes: Cervical, supraclavicular, and axillary nodes normal. Resp: clear to auscultation bilaterally Back: symmetric, no curvature. ROM normal. No CVA tenderness. Cardio: regular rate and rhythm, S1, S2 normal, no murmur, click, rub or gallop GI: soft, non-tender; bowel sounds normal; no masses,  no  organomegaly Extremities: extremities normal, atraumatic, no cyanosis or edema Neurologic: Alert and oriented X 3, normal strength and tone. Normal symmetric reflexes. Normal coordination and gait  ECOG PERFORMANCE STATUS: 1 - Symptomatic but completely ambulatory  Pulse 80, temperature 97.6 F (36.4 C), temperature source Temporal, resp. rate 17, height 5' 7 (1.702 m), weight 146 lb (66.2 kg), SpO2 95%.  LABORATORY DATA: Lab Results  Component Value Date   WBC 4.0 10/23/2024   HGB 8.0 (L) 10/23/2024   HCT 24.8 (L) 10/23/2024   MCV 100.0 10/23/2024   PLT 151 10/23/2024      Chemistry      Component Value Date/Time   NA 142 10/12/2024 0556   NA 142 11/11/2022 1000   K 3.1 (L) 10/12/2024 0556   CL 111 10/12/2024 0556   CO2 22 10/12/2024 0556   BUN 9 10/12/2024 0556   BUN 18 11/11/2022 1000   CREATININE 0.95 10/12/2024 0556   CREATININE 1.13 09/27/2024 1048   CREATININE 1.37 (H) 01/24/2017 0950      Component Value Date/Time   CALCIUM  8.9 10/12/2024 0556   ALKPHOS 43 10/06/2024 1432   AST 19 10/06/2024 1432   AST 17 09/27/2024 1048   ALT 8 10/06/2024 1432   ALT 8 09/27/2024 1048   BILITOT 0.8 10/06/2024 1432   BILITOT 0.5 09/27/2024 1048       RADIOGRAPHIC STUDIES: US  EKG SITE RITE Result Date: 10/11/2024 If Site Rite image not attached, placement could not be confirmed due to current cardiac rhythm.  ECHO TEE Result Date: 10/11/2024    TRANSESOPHOGEAL ECHO REPORT   Patient Name:   [name] Date of Exam: 10/11/2024 Medical Rec #:  [id number]        Height:       67.0 in Accession #:    [id number]       Weight:       149.0 lb Date of Birth:  06-Apr-1941         BSA:          1.784 m Patient Age:    83 years         BP:           119/67 mmHg Patient Gender: M                HR:           74 bpm. Exam Location:  Inpatient Procedure: Transesophageal Echo, Cardiac Doppler and Color Doppler (Both  Spectral and Color Flow Doppler were utilized during  procedure). Indications:     Subacute bacterial endocarditis;  History:         Patient has prior history of Echocardiogram examinations, most                  recent 10/09/2024. Abnormal ECG and Defibrillator, COPD,                  Arrythmias:Atrial Fibrillation and NSVT,                  Signs/Symptoms:Bacteremia; Risk Factors:Hypertension. Chemo.  Sonographer:     Ellouise Mose RDCS Referring Phys:  [id number] ZANE ADAMS Diagnosing Phys: Jerel Balding MD PROCEDURE: After discussion of the risks and benefits of a TEE, an informed consent was obtained from the patient. The transesophogeal probe was passed without difficulty through the esophogus of the patient. Imaged were obtained with the patient in a left lateral decubitus position. Sedation performed by different physician. The patient was monitored while under deep sedation. Anesthestetic sedation was provided intravenously by Anesthesiology: 170mg  of Propofol , 100mg  of Lidocaine . The patient's vital signs; including heart rate, blood pressure, and oxygen saturation; remained stable throughout the procedure. The patient developed no complications during the procedure.  IMPRESSIONS  1. Left ventricular ejection fraction, by estimation, is 45 to 50%. The left ventricle has mildly decreased function. The left ventricle has no regional wall motion abnormalities. There is mild concentric left ventricular hypertrophy.  2. Right ventricular systolic function is normal. The right ventricular size is normal. Moderately increased right ventricular wall thickness. Tricuspid regurgitation signal is inadequate for assessing PA pressure. The estimated right ventricular systolic pressure is 19.4 mmHg.  3. Left atrial size was mildly dilated. No left atrial/left atrial appendage thrombus was detected.  4. The mitral valve is normal in structure. No evidence of mitral valve regurgitation. No evidence of mitral stenosis.  5. The aortic valve is tricuspid. There is mild thickening of  the aortic valve. Aortic valve regurgitation is not visualized. Aortic valve sclerosis is present, with no evidence of aortic valve stenosis.  6. There is mild (Grade II) protruding plaque involving the descending aorta. Conclusion(s)/Recommendation(s): No evidence of vegetation/infective endocarditis on this transesophageael echocardiogram. FINDINGS  Left Ventricle: Left ventricular ejection fraction, by estimation, is 45 to 50%. The left ventricle has mildly decreased function. The left ventricle has no regional wall motion abnormalities. The left ventricular internal cavity size was normal in size. There is mild concentric left ventricular hypertrophy. Right Ventricle: The right ventricular size is normal. Moderately increased right ventricular wall thickness. Right ventricular systolic function is normal. Tricuspid regurgitation signal is inadequate for assessing PA pressure. The tricuspid regurgitant  velocity is 1.90 m/s, and with an assumed right atrial pressure of 5 mmHg, the estimated right ventricular systolic pressure is 19.4 mmHg. Left Atrium: Left atrial size was mildly dilated. No left atrial/left atrial appendage thrombus was detected. Right Atrium: Right atrial size was normal in size. Pericardium: There is no evidence of pericardial effusion. Mitral Valve: The mitral valve is normal in structure. No evidence of mitral valve regurgitation. No evidence of mitral valve stenosis. Tricuspid Valve: The tricuspid valve is normal in structure. Tricuspid valve regurgitation is not demonstrated. Aortic Valve: The aortic valve is tricuspid. There is mild thickening of the aortic valve. Aortic valve regurgitation is not visualized. Aortic valve sclerosis is present, with no evidence of aortic valve stenosis. Pulmonic Valve: The pulmonic valve was normal in structure. Pulmonic  valve regurgitation is not visualized. No evidence of pulmonic stenosis. Aorta: The aortic root and ascending aorta are structurally  normal, with no evidence of dilitation. There is mild (Grade II) protruding plaque involving the descending aorta. IAS/Shunts: No atrial level shunt detected by color flow Doppler. Additional Comments: A device lead is visualized in the right ventricle, right atrium and superior vena cava. Spectral Doppler performed. TRICUSPID VALVE TR Peak grad:   14.4 mmHg TR Vmax:        190.00 cm/s Jerel Balding MD Electronically signed by Jerel Balding MD Signature Date/Time: 10/11/2024/2:13:42 PM    Final    EP STUDY Result Date: 10/11/2024 See surgical note for result.  ECHOCARDIOGRAM COMPLETE Result Date: 10/09/2024    ECHOCARDIOGRAM REPORT   Patient Name:   [name] Date of Exam: 10/09/2024 Medical Rec #:  [id number]        Height:       67.0 in Accession #:    [id number]       Weight:       149.0 lb Date of Birth:  04-13-1941         BSA:          1.784 m Patient Age:    83 years         BP:           101/58 mmHg Patient Gender: M                HR:           100 bpm. Exam Location:  Inpatient Procedure: 2D Echo (Both Spectral and Color Flow Doppler were utilized during            procedure). Indications:    Endocarditis  History:        Patient has prior history of Echocardiogram examinations.                 Pacemaker; Signs/Symptoms:Bacteremia.  Sonographer:    Norleen Amour Referring Phys: [id number] [facility] IMPRESSIONS  1. NO obvious vegetations though cannot completely exclude If clinically indicated, recommend TEE.  2. HYpokinesis of the lateral wall (base, mid). Left ventricular ejection fraction, by estimation, is 45 to 50%. The left ventricle has mildly decreased function. Left ventricular diastolic parameters are consistent with Grade I diastolic dysfunction (impaired relaxation).  3. Right ventricular systolic function is normal. The right ventricular size is normal. There is normal pulmonary artery systolic pressure.  4. Left atrial size was moderately dilated.  5. The mitral valve is normal  in structure. Mild mitral valve regurgitation.  6. The aortic valve is tricuspid. Aortic valve regurgitation is not visualized. Aortic valve sclerosis/calcification is present, without any evidence of aortic stenosis.  7. The inferior vena cava is normal in size with greater than 50% respiratory variability, suggesting right atrial pressure of 3 mmHg. FINDINGS  Left Ventricle: HYpokinesis of the lateral wall (base, mid). Left ventricular ejection fraction, by estimation, is 45 to 50%. The left ventricle has mildly decreased function. The left ventricular internal cavity size was normal in size. There is no left ventricular hypertrophy. Left ventricular diastolic parameters are consistent with Grade I diastolic dysfunction (impaired relaxation). Right Ventricle: The right ventricular size is normal. Right vetricular wall thickness was not assessed. Right ventricular systolic function is normal. There is normal pulmonary artery systolic pressure. The tricuspid regurgitant velocity is 2.87 m/s, and with an assumed right atrial pressure of 3 mmHg, the estimated right ventricular systolic pressure is 35.9  mmHg. Left Atrium: Left atrial size was moderately dilated. Right Atrium: Right atrial size was normal in size. Pericardium: Trivial pericardial effusion is present. Mitral Valve: The mitral valve is normal in structure. Mild mitral valve regurgitation. MV peak gradient, 4.0 mmHg. The mean mitral valve gradient is 1.0 mmHg. Tricuspid Valve: The tricuspid valve is normal in structure. Tricuspid valve regurgitation is mild. Aortic Valve: The aortic valve is tricuspid. Aortic valve regurgitation is not visualized. Aortic valve sclerosis/calcification is present, without any evidence of aortic stenosis. Pulmonic Valve: The pulmonic valve was normal in structure. Pulmonic valve regurgitation is not visualized. Aorta: The aortic root and ascending aorta are structurally normal, with no evidence of dilitation. Venous: The  inferior vena cava is normal in size with greater than 50% respiratory variability, suggesting right atrial pressure of 3 mmHg. IAS/Shunts: No atrial level shunt detected by color flow Doppler. Additional Comments: A device lead is visualized.  LEFT VENTRICLE PLAX 2D LVIDd:         4.60 cm     Diastology LVIDs:         3.60 cm     LV e' medial:    6.85 cm/s LV PW:         1.10 cm     LV E/e' medial:  15.8 LV IVS:        0.80 cm     LV e' lateral:   9.36 cm/s LVOT diam:     2.20 cm     LV E/e' lateral: 11.5 LV SV:         48 LV SV Index:   27 LVOT Area:     3.80 cm LV IVRT:       90 msec  LV Volumes (MOD) LV vol d, MOD A2C: 84.1 ml LV vol d, MOD A4C: 93.6 ml LV vol s, MOD A2C: 44.2 ml LV vol s, MOD A4C: 51.2 ml LV SV MOD A2C:     39.9 ml LV SV MOD A4C:     93.6 ml LV SV MOD BP:      43.3 ml RIGHT VENTRICLE             IVC RV Basal diam:  3.50 cm     IVC diam: 1.50 cm RV S prime:     16.10 cm/s TAPSE (M-mode): 2.5 cm      PULMONARY VEINS                             Diastolic Velocity: 72.40 cm/s                             S/D Velocity:       0.80                             Systolic Velocity:  57.80 cm/s LEFT ATRIUM             Index        RIGHT ATRIUM           Index LA Vol (A2C):   68.0 ml 38.11 ml/m  RA Area:     18.50 cm LA Vol (A4C):   78.5 ml 43.99 ml/m  RA Volume:   44.80 ml  25.11 ml/m LA Biplane Vol: 74.2 ml 41.58 ml/m  AORTIC VALVE  PULMONIC VALVE LVOT Vmax:   73.10 cm/s  PV Vmax:       1.11 m/s LVOT Vmean:  45.400 cm/s PV Peak grad:  4.9 mmHg LVOT VTI:    0.127 m  AORTA Ao Root diam: 2.70 cm Ao Asc diam:  3.00 cm MITRAL VALVE                TRICUSPID VALVE MV Area (PHT): 4.46 cm     TR Peak grad:   32.9 mmHg MV Area VTI:   2.04 cm     TR Vmax:        287.00 cm/s MV Peak grad:  4.0 mmHg MV Mean grad:  1.0 mmHg     SHUNTS MV Vmax:       1.00 m/s     Systemic VTI:  0.13 m MV Vmean:      54.9 cm/s    Systemic Diam: 2.20 cm MV Decel Time: 170 msec MV E velocity: 108.00 cm/s MV A velocity:  52.30 cm/s MV E/A ratio:  2.07 Vina Gull MD Electronically signed by Vina Gull MD Signature Date/Time: 10/09/2024/1:01:31 PM    Final    VAS US  LOWER EXTREMITY VENOUS (DVT) (ONLY [facility]) Result Date: 10/07/2024  Lower Venous DVT Study Patient Name:  [name]  Date of Exam:   10/06/2024 Medical Rec #: [id number]         Accession #:    [id number] Date of Birth: 1941-03-22          Patient Gender: M Patient Age:   86 years Exam Location:  [facility] Procedure:      VAS US  LOWER EXTREMITY VENOUS (DVT) Referring Phys: MATTHEW TRIFAN --------------------------------------------------------------------------------  Indications: Pain, and Darkened erythema along lateral lower leg.  Risk Factors: Cancer: Primary adenocarcinoma of upper lobe of left lung, stage IIIC/IV, on chemotherapy Paroxysmal atrial fibrillation, ICD. Limitations: Pain with compression maneuvers in calf. Comparison Study: Prior negative right LEV done 02/26/16 Performing Technologist: Alberta Lis RVS  Examination Guidelines: A complete evaluation includes B-mode imaging, spectral Doppler, color Doppler, and power Doppler as needed of all accessible portions of each vessel. Bilateral testing is considered an integral part of a complete examination. Limited examinations for reoccurring indications may be performed as noted. The reflux portion of the exam is performed with the patient in reverse Trendelenburg.  +---------+---------------+---------+-----------+----------+---------------+ RIGHT    CompressibilityPhasicitySpontaneityPropertiesThrombus Aging  +---------+---------------+---------+-----------+----------+---------------+ CFV      Full           Yes      Yes                                  +---------+---------------+---------+-----------+----------+---------------+ SFJ      Full                                                          +---------+---------------+---------+-----------+----------+---------------+ FV Prox  Full           Yes      Yes                                  +---------+---------------+---------+-----------+----------+---------------+ FV Mid   Full                                                         +---------+---------------+---------+-----------+----------+---------------+  FV DistalFull                                                         +---------+---------------+---------+-----------+----------+---------------+ PFV      Full           Yes      Yes                                  +---------+---------------+---------+-----------+----------+---------------+ POP      Full           Yes      Yes                                  +---------+---------------+---------+-----------+----------+---------------+ PTV                                                   patent by color +---------+---------------+---------+-----------+----------+---------------+ PERO                                                  patent by color +---------+---------------+---------+-----------+----------+---------------+   +----+---------------+---------+-----------+----------+--------------+ LEFTCompressibilityPhasicitySpontaneityPropertiesThrombus Aging +----+---------------+---------+-----------+----------+--------------+ CFV Full           Yes      Yes                                 +----+---------------+---------+-----------+----------+--------------+ SFJ Full                                                        +----+---------------+---------+-----------+----------+--------------+     Summary: RIGHT: - There is no evidence of deep vein thrombosis in the lower extremity.  - Ultrasound characteristics of enlarged lymph nodes are noted in the groin.  LEFT: - No evidence of common femoral vein obstruction.  - Ultrasound characteristics of enlarged lymph nodes noted in the  groin.  *See table(s) above for measurements and observations. Electronically signed by Penne Colorado MD on 10/07/2024 at 10:44:45 AM.    Final    DG Chest 2 View Result Date: 10/06/2024 EXAM: 2 VIEW(S) XRAY OF THE CHEST 10/06/2024 02:13:00 PM COMPARISON: 07/12/2024 CLINICAL HISTORY: chest pain FINDINGS: LINES, TUBES AND DEVICES: Stable left subclavian AICD. LUNGS AND PLEURA: Persistent lingular focal masslike consolidation. No pleural effusion. No pneumothorax. HEART AND MEDIASTINUM: AP window is obscured, suggesting mediastinal adenopathy as before. No acute abnormality of the cardiac silhouette. BONES AND SOFT TISSUES: Thoracolumbar fixation hardware partially visualized without acute finding. No acute osseous abnormality. IMPRESSION: 1. Persistent lingular focal masslike consolidation, unchanged from prior study. 2. AP window is obscured suggesting mediastinal adenopathy, unchanged from prior study. Electronically signed by: Dayne Hassell MD 10/06/2024 02:20 PM EST RP Workstation: [id number]  ASSESSMENT AND PLAN: This is a very pleasant 83 years old African-American male with Stage IIIC/IV (T3, N3, M0) non-small cell lung cancer, adenocarcinoma. He presented with a left upper lobe spiculated mass in addition to lingular lesion and suspicious mediastinal lymphadenopathy. He started a course of concurrent chemoradiation initially with carboplatin  for AUC of 2 and paclitaxel  45 Mg/M2 but paclitaxel  was the changed to Abraxane  starting from cycle #2 secondary to hypersensitivity reaction.  The patient received 5 cycles of concurrent chemoradiation.  He tolerated this treatment fairly well except for fatigue. The patient is currently on consolidation treatment with immunotherapy with Imfinzi  1500 Mg IV every 4 weeks.  Status post 2 cycles.  He has been tolerating this treatment well except for the itching and rash The patient he in the lower abdomen and back.  He decided to stop his treatment at that time.   Last dose was given in February 2025.  He is currently on observation. He had no evidence for disease recurrence in July 2025 and he started palliative systemic chemoimmunotherapy with carboplatin  for AUC of 4, Alimta  400 Mg/M2 and Keytruda  200 Mg IV every 3 weeks.  Status post 5 cycles.  First cycle was given on June 20, 2024.  He has a rough time with pruritus as well as itching which is likely secondary to Keytruda .  He has similar symptoms when he was on Imfinzi  in the past.  Keytruda  was discontinued starting from cycle #2.   Assessment and Plan Assessment & Plan Metastatic non-small cell lung cancer Initially diagnosed in August 2024. Underwent concurrent chemoradiation followed by two cycles of consolidation treatment with durvalumab . Currently on single-agent treatment with Alimta  every three weeks. Cancer is well-controlled with no evidence of growth. Chemotherapy delayed due to recent infection. - Delayed chemotherapy until early January - Continue folic acid  supplementation due to Alimta  treatment  Right lower extremity cellulitis with staphylococcal bacteremia Admitted to the hospital two weeks ago with right lower extremity cellulitis and bacteremia with positive staphylococcus lugdunensis. Currently on six weeks of IV antibiotic treatment with Ancef , as recommended by infectious disease. Completed two weeks of treatment and has one month remaining. - Continue Ancef  twice daily for six weeks  Anemia Present. - Recommended over-the-counter iron supplementation, one tablet daily - Recommended multivitamins The patient was advised to call immediately if she has any other concerning symptoms in the interval.  The patient voices understanding of current disease status and treatment options and is in agreement with the current care plan.  All questions were answered. The patient knows to call the clinic with any problems, questions or concerns. We can certainly see the patient much sooner  if necessary. The total time spent in the appointment was 30 minutes including review of chart and various tests results, discussions about plan of care and coordination of care plan .   Disclaimer: This note was dictated with voice recognition software. Similar sounding words can inadvertently be transcribed and may not be corrected upon review.

## 2024-10-24 ENCOUNTER — Inpatient Hospital Stay

## 2024-10-24 LAB — T4: T4, Total: 7.4 ug/dL (ref 4.5–12.0)

## 2024-11-01 ENCOUNTER — Telehealth: Payer: Self-pay

## 2024-11-01 ENCOUNTER — Encounter: Payer: Self-pay | Admitting: Internal Medicine

## 2024-11-01 ENCOUNTER — Ambulatory Visit: Payer: Self-pay | Admitting: Internal Medicine

## 2024-11-01 ENCOUNTER — Other Ambulatory Visit: Payer: Self-pay

## 2024-11-01 VITALS — BP 156/80 | HR 77 | Temp 99.1°F | Ht 67.0 in | Wt 146.0 lb

## 2024-11-01 DIAGNOSIS — R7881 Bacteremia: Secondary | ICD-10-CM | POA: Diagnosis not present

## 2024-11-01 DIAGNOSIS — L03115 Cellulitis of right lower limb: Secondary | ICD-10-CM | POA: Diagnosis not present

## 2024-11-01 DIAGNOSIS — B957 Other staphylococcus as the cause of diseases classified elsewhere: Secondary | ICD-10-CM

## 2024-11-01 NOTE — Patient Instructions (Signed)
 Pull picc after last dose of IV abx on 12/29

## 2024-11-01 NOTE — Progress Notes (Unsigned)
 Patient: [name]  DOB: 30-May-1941 MRN: [id number] PCP: [name], MD  Referring Provider: ***  No chief complaint on file.    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) 02/23/2024   Chemotherapy induced neutropenia 09/05/2023   Encounter for antineoplastic chemotherapy 08/23/2023   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) 08/03/2023   Goals of care, counseling/discussion 08/03/2023   Costochondritis 04/29/2023   Abnormal CT of the chest 09/04/2020   COPD (chronic obstructive pulmonary disease) (HCC) 09/04/2020   ICD (implantable cardioverter-defibrillator) in place    Cardiac syncope 11/07/2019   Closed fracture of distal fibula 11/07/2019   Pain due to onychomycosis of toenails of both feet 05/16/2019   Coagulation disorder 05/16/2019   Paroxysmal atrial fibrillation (HCC) 08/02/2018   NSVT (nonsustained ventricular tachycardia) (HCC) 08/02/2018   Symptomatic bradycardia 07/12/2018   Pacemaker 07/12/2018   Tachycardia-bradycardia syndrome (HCC)    Unsteady gait 03/12/2016   Nausea vomiting and diarrhea 03/11/2016   Acute lower UTI 03/11/2016   Gastroenteritis 03/11/2016   Obstipation 03/11/2016   Abdominal pain    Constipation    Lactic acidosis    Leg pain    Essential hypertension 03/04/2016   Ventricular tachycardia (paroxysmal) (HCC) 04/11/2015   Chest pain with moderate risk for cardiac etiology 04/09/2015   CAP (community acquired pneumonia) 02/18/2015   Chest pain at rest 02/18/2015   Peripheral arterial disease 08/15/2014   Hyperlipidemia with target LDL less than 70 01/01/2014   GERD (gastroesophageal reflux disease) 01/01/2014   First degree AV block  07/31/2012   Unstable angina, negative MI 06/05/2012   Second degree AV block, Mobitz type I 05/27/2012   Chronic kidney disease, stage 3b (HCC) 05/27/2012   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 M with with past medical history as below including history of stage IV non-small cell lung cancer-adenocarcinoma on reduced chemotherapy, pancytopenia, AICD, A-fib on Eliquis , CKD stage III, CAD with chronic heart failure, hypertension, legally blind, PAD presents for hospital follow-up of staph lugdunensis bacteremia likely secondary to right lower extremity cellulitis in setting of ICD.  Patient had presented for right leg pain and fatigue admitted for right lower leg cellulitis.  Placed on vancomycin  cefepime .  ID engaged as blood cultures from admission grew 1 out of 4 bottles staph epi and 1 out of 4 bottles staph lugdunensis.  Patient has ICD S that should EP was uninvolved.  TEE did not show vegetation.  EP noted patient is not a candidate for extraction.  Discharged on cefazolin  x 6 weeks from negative blood cultures EOT 12/29. Today:  ROS  Past Medical History:  Diagnosis Date   AICD (automatic cardioverter/defibrillator) present    CAD (coronary artery disease) 80% stenosis diag of the LAD, 30% in OM2 branch of LCX in 2009    a. Nonobstructive  CAD by cath 11/2011 with the exception of the pre-existing diagonal branch #2 lesion.   Chronic systolic CHF (congestive heart failure) (HCC)    CKD (chronic kidney disease) stage 3, GFR 30-59 ml/min (HCC)    Colon polyp, hyperplastic    History of  kidney stones    History of radiation therapy    Left Lung- 08/17/23-09/29/23- Dr. Lynwood Nasuti   History of stress test 06/01/2012   Normal myocardial perfusion study. compared to the previous study there is no significant change. this is a low risk scan   Hypertension    Legally blind    both eyes   Myocardial infarction [facility]) 11/22/2011   NICM (nonischemic cardiomyopathy) (HCC)    a. Remote hx of dilated NICM with EF ranging 20-45%, including normal EF by echo (55-60%) in 2014.   Peripheral arterial disease    a. 06/2014: ABI right 0.99, left 1.2, LE dopplers revealing an occluded right posterior tibial. As symptoms were not felt r/t claudication, no further w/u at the time.   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 Requiring discontinuation of BB dose.   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    Outpatient Medications Prior to Visit  Medication Sig Dispense Refill   apixaban  (ELIQUIS ) 2.5 MG TABS tablet Take 1 tablet (2.5 mg total) by mouth 2 (two) times daily. 180 tablet 1   BREO ELLIPTA  100-25 MCG/INH AEPB Inhale 1 puff into the lungs every morning.     brimonidine  (ALPHAGAN ) 0.2 % ophthalmic solution Place 1 drop into both eyes 2 (two) times daily.     ceFAZolin  (ANCEF ) IVPB Inject 2 g into the vein every 12 (twelve) hours. Indication:  Staph lugdunesis bacteremia + ICD First Dose: Yes Last Day of Therapy:  11/20/23 Labs - Once weekly:  CBC/D and BMP, Labs - Once weekly: ESR and CRP Method of administration: IV Push Method of administration may be changed at the discretion of home infusion pharmacist based upon assessment of the patient and/or caregiver's ability to self-administer the medication ordered. 80 Units 0   Continuous Glucose Sensor (FREESTYLE LIBRE 3 PLUS SENSOR) MISC SMARTSIG:Topical Every 3 Months     cycloSPORINE  (RESTASIS ) 0.05 %  ophthalmic emulsion Place 1 drop into both eyes 2 (two) times daily.     dorzolamide -timolol  (COSOPT ) 22.3-6.8 MG/ML ophthalmic solution Place 1 drop into both eyes 2 (two) times daily.     ENTRESTO  24-26 MG TAKE 1 TABLET BY MOUTH TWICE DAILY 180 tablet 3   folic acid  (FOLVITE ) 1 MG tablet Take 1 tablet (1 mg total) by mouth daily. Start 7 days before pemetrexed  chemotherapy. Continue until 21 days after pemetrexed  completed. 100 tablet 3   furosemide  (LASIX ) 20 MG tablet Take 0.5 tablets (10 mg total) by mouth daily. 30 tablet 1   Glucagon , rDNA, (GLUCAGON  EMERGENCY) 1 MG KIT Inject 1 mg into the skin as needed for up to 2 doses (Severe low blood sugar). 1 kit 0   hydrocortisone  1 % lotion Apply 1 Application topically 2 (two) times daily. 113 g 0   insulin  isophane & regular human KwikPen (HUMULIN 70/30 MIX) (70-30) 100 UNIT/ML KwikPen Inject 25 Units into the skin in the morning and at bedtime. 15 mL 0   isosorbide  mononitrate (IMDUR ) 30 MG 24 hr tablet Take 30 mg by mouth daily.     lidocaine  (LIDODERM ) 5 %  Place 1 patch onto the skin daily. Remove & Discard patch within 12 hours or as directed by MD 30 patch 0   metoprolol  succinate (TOPROL -XL) 25 MG 24 hr tablet Take 1 tablet (25 mg total) by mouth daily. 90 tablet 3   omeprazole  (PRILOSEC) 20 MG capsule TAKE 1 CAPSULE(20 MG) BY MOUTH DAILY 30 capsule 2   ondansetron  (ZOFRAN ) 8 MG tablet Take 1 tablet (8 mg total) by mouth every 8 (eight) hours as needed for nausea or vomiting. Start on the third day after carboplatin . 30 tablet 1   RHOPRESSA 0.02 % SOLN Apply 1 drop to eye every evening.     ROCKLATAN  0.02-0.005 % SOLN Place 1 drop into both eyes at bedtime.     spironolactone  (ALDACTONE ) 25 MG tablet Take 0.5 tablets (12.5 mg total) by mouth daily. 45 tablet 3   VYZULTA  0.024 % SOLN Apply 1 drop to eye at bedtime.     No facility-administered medications prior to visit.     Allergies[1]  Social History[2]  Family History  Problem  Relation Age of Onset   Asthma Mother    Other Other        Unsure if any heart disease in his family.    Objective:  There were no vitals filed for this visit. There is no height or weight on file to calculate BMI.  Physical Exam  Lab Results: Lab Results  Component Value Date   WBC 4.0 10/23/2024   HGB 8.0 (L) 10/23/2024   HCT 24.8 (L) 10/23/2024   MCV 100.0 10/23/2024   PLT 151 10/23/2024    Lab Results  Component Value Date   CREATININE 0.84 10/23/2024   BUN 9 10/23/2024   NA 138 10/23/2024   K 3.6 10/23/2024   CL 105 10/23/2024   CO2 23 10/23/2024    Lab Results  Component Value Date   ALT <5 10/23/2024   AST 23 10/23/2024   ALKPHOS 48 10/23/2024   BILITOT 0.5 10/23/2024     Assessment & Plan:  #Staph lugdunensis bacteremia likely secondary to right lower extremity cellulitis in setting of ICD - Patient presented with right leg pain and right leg lower leg cellulitis.  Blood cultures grew 1 out of 4 staph lugdunensis, staph epi 1 out of 4 bottles suspected contaminant.  Patient has been on Zarzio for pancytopenia induced by chemotherapy.  Is on active chemotherapy with Dr. Sherrod. - TEE did not show vegetation.  EP did not feel patient was an extraction candidate.  Discharged with cefazolin  x 6 restenotic blood cultures EOT 12/29. - Given that it is unclear if patient had true bacteremia.  The reason for doubting true bacteremia as blood cultures grew staph lugdunensis and staph epi which coag negative staph.  Given the staph lugdunensis grew I think 6 weeks is sufficient for endovascular involvement.  As no vegetation seen on TEE and source likely cellulitis would not do suppression at this point as it is unlikely that the device is involved.  Will repeat blood cultures 2 to 3 weeks of antibiotics with close monitoring. #PICC  #Medication 10/29/24 wbc 3, plt 120, scr 0.88, est 67, crp 46 -Pull picc after last dose on 12/29. Will do bloodCx off aabx at 6 week follow  up(2 weeks off of abx)4   Shera Stank, MD [facility]   11/01/2024  3:23 PM     [1]  Allergies Allergen Reactions   Paclitaxel  Other (See Comments)  and Hypertension    Pt reported chest pain, back pain, and SOB during first infusion (see notes from 9/23). Infusion discontinued.    Allegra [Fexofenadine] Other (See Comments)    Makes him dizzy and feels like he will fall.   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

## 2024-11-01 NOTE — Telephone Encounter (Signed)
 Got it thank you

## 2024-11-01 NOTE — Telephone Encounter (Signed)
 Per Dr. Dennise end date to pull picc after last dose of IV abx is on 12/29. Sent a message to Holley Herring RN at [employer] about orders as well.

## 2024-11-05 ENCOUNTER — Other Ambulatory Visit: Payer: Self-pay

## 2024-11-08 ENCOUNTER — Inpatient Hospital Stay

## 2024-11-08 ENCOUNTER — Inpatient Hospital Stay: Admitting: Internal Medicine

## 2024-11-09 ENCOUNTER — Telehealth: Payer: Self-pay | Admitting: Medical Oncology

## 2024-11-09 DIAGNOSIS — C3412 Malignant neoplasm of upper lobe, left bronchus or lung: Secondary | ICD-10-CM

## 2024-11-09 MED ORDER — ONDANSETRON HCL 8 MG PO TABS: 8.0000 mg | ORAL_TABLET | Freq: Three times a day (TID) | ORAL | 1 refills | Status: AC | PRN

## 2024-11-09 NOTE — Telephone Encounter (Signed)
 Requested refill for ondansetron . She said the phlegm is making him nauseated. . No other symptoms.  Refill sent.

## 2024-11-13 ENCOUNTER — Inpatient Hospital Stay: Admitting: Physician Assistant

## 2024-11-13 ENCOUNTER — Inpatient Hospital Stay

## 2024-11-19 ENCOUNTER — Telehealth: Payer: Self-pay

## 2024-11-19 NOTE — Telephone Encounter (Signed)
 Received voicemail from Homeland with [employer] stating patient is short one dose of cefazolin . He has enough to do today's AM dose, but not for this evening. She would like to know if it's okay for patient to skip his last dose tonight. Will route to provider.   Ariel [phone]  Duwaine Lowe, BSN, RN

## 2024-11-19 NOTE — Telephone Encounter (Signed)
 Called Ariel back, relayed per Dr. Dennise that okay for patient to miss his last dose tonight.   Boone Gear, BSN, RN

## 2024-11-20 ENCOUNTER — Telehealth: Payer: Self-pay | Admitting: Pharmacist

## 2024-11-20 NOTE — Telephone Encounter (Signed)
 Received faxed home health 12/29 labs today showing hemoglobin 6.9. Spoke with patient who denies any new symptoms or concerns. Completed Ancef  one day early on 12/28 for Staph lugdunensis bacteremia. Will defer to med onc team for management if needing blood transfusion. He is scheduled to follow with med onc team on 11/27/24. Thank you!   Alan Geralds, PharmD, CPP, BCIDP, AAHIVP Clinical Pharmacist Practitioner Infectious Diseases Clinical Pharmacist [facility]

## 2024-11-21 ENCOUNTER — Telehealth: Payer: Self-pay | Admitting: Medical Oncology

## 2024-11-21 ENCOUNTER — Inpatient Hospital Stay

## 2024-11-21 ENCOUNTER — Other Ambulatory Visit: Payer: Self-pay

## 2024-11-21 DIAGNOSIS — D6481 Anemia due to antineoplastic chemotherapy: Secondary | ICD-10-CM

## 2024-11-21 NOTE — Telephone Encounter (Signed)
 Wife said she has to find transportation so Nathanie can come today for lab and possible transfusion. I called [facility] and asked them to fax his recent labs to pod 3.

## 2024-11-22 ENCOUNTER — Emergency Department (HOSPITAL_COMMUNITY)

## 2024-11-22 ENCOUNTER — Encounter (HOSPITAL_COMMUNITY): Payer: Self-pay | Admitting: Emergency Medicine

## 2024-11-22 ENCOUNTER — Inpatient Hospital Stay (HOSPITAL_COMMUNITY)
Admission: EM | Admit: 2024-11-22 | Discharge: 2024-11-29 | DRG: 180 | Disposition: A | Discharge status: Hospice | Attending: Internal Medicine | Admitting: Internal Medicine

## 2024-11-22 DIAGNOSIS — J910 Malignant pleural effusion: Secondary | ICD-10-CM | POA: Diagnosis present

## 2024-11-22 DIAGNOSIS — Z794 Long term (current) use of insulin: Secondary | ICD-10-CM

## 2024-11-22 DIAGNOSIS — Z87891 Personal history of nicotine dependence: Secondary | ICD-10-CM

## 2024-11-22 DIAGNOSIS — I5043 Acute on chronic combined systolic (congestive) and diastolic (congestive) heart failure: Secondary | ICD-10-CM | POA: Diagnosis present

## 2024-11-22 DIAGNOSIS — J90 Pleural effusion, not elsewhere classified: Principal | ICD-10-CM | POA: Diagnosis present

## 2024-11-22 DIAGNOSIS — C3490 Malignant neoplasm of unspecified part of unspecified bronchus or lung: Secondary | ICD-10-CM

## 2024-11-22 DIAGNOSIS — Z7901 Long term (current) use of anticoagulants: Secondary | ICD-10-CM

## 2024-11-22 DIAGNOSIS — D6481 Anemia due to antineoplastic chemotherapy: Secondary | ICD-10-CM

## 2024-11-22 DIAGNOSIS — I252 Old myocardial infarction: Secondary | ICD-10-CM

## 2024-11-22 DIAGNOSIS — Z923 Personal history of irradiation: Secondary | ICD-10-CM

## 2024-11-22 DIAGNOSIS — I2510 Atherosclerotic heart disease of native coronary artery without angina pectoris: Secondary | ICD-10-CM | POA: Diagnosis present

## 2024-11-22 DIAGNOSIS — I428 Other cardiomyopathies: Secondary | ICD-10-CM | POA: Diagnosis present

## 2024-11-22 DIAGNOSIS — Z9221 Personal history of antineoplastic chemotherapy: Secondary | ICD-10-CM

## 2024-11-22 DIAGNOSIS — C3412 Malignant neoplasm of upper lobe, left bronchus or lung: Secondary | ICD-10-CM | POA: Diagnosis not present

## 2024-11-22 DIAGNOSIS — I110 Hypertensive heart disease with heart failure: Secondary | ICD-10-CM | POA: Diagnosis present

## 2024-11-22 DIAGNOSIS — R0602 Shortness of breath: Secondary | ICD-10-CM | POA: Diagnosis present

## 2024-11-22 DIAGNOSIS — Z6820 Body mass index (BMI) 20.0-20.9, adult: Secondary | ICD-10-CM

## 2024-11-22 DIAGNOSIS — Z66 Do not resuscitate: Secondary | ICD-10-CM | POA: Diagnosis present

## 2024-11-22 DIAGNOSIS — I739 Peripheral vascular disease, unspecified: Secondary | ICD-10-CM | POA: Diagnosis present

## 2024-11-22 DIAGNOSIS — Z825 Family history of asthma and other chronic lower respiratory diseases: Secondary | ICD-10-CM

## 2024-11-22 DIAGNOSIS — Z515 Encounter for palliative care: Secondary | ICD-10-CM

## 2024-11-22 DIAGNOSIS — R54 Age-related physical debility: Secondary | ICD-10-CM | POA: Diagnosis present

## 2024-11-22 DIAGNOSIS — J95811 Postprocedural pneumothorax: Secondary | ICD-10-CM | POA: Diagnosis not present

## 2024-11-22 DIAGNOSIS — D638 Anemia in other chronic diseases classified elsewhere: Secondary | ICD-10-CM | POA: Diagnosis not present

## 2024-11-22 DIAGNOSIS — D61818 Other pancytopenia: Secondary | ICD-10-CM | POA: Diagnosis not present

## 2024-11-22 DIAGNOSIS — R64 Cachexia: Secondary | ICD-10-CM | POA: Diagnosis present

## 2024-11-22 DIAGNOSIS — H548 Legal blindness, as defined in USA: Secondary | ICD-10-CM | POA: Diagnosis present

## 2024-11-22 DIAGNOSIS — Z7951 Long term (current) use of inhaled steroids: Secondary | ICD-10-CM

## 2024-11-22 DIAGNOSIS — C799 Secondary malignant neoplasm of unspecified site: Secondary | ICD-10-CM | POA: Diagnosis present

## 2024-11-22 DIAGNOSIS — I472 Ventricular tachycardia, unspecified: Secondary | ICD-10-CM | POA: Diagnosis not present

## 2024-11-22 DIAGNOSIS — I509 Heart failure, unspecified: Secondary | ICD-10-CM

## 2024-11-22 DIAGNOSIS — Y848 Other medical procedures as the cause of abnormal reaction of the patient, or of later complication, without mention of misadventure at the time of the procedure: Secondary | ICD-10-CM | POA: Diagnosis not present

## 2024-11-22 DIAGNOSIS — Z95810 Presence of automatic (implantable) cardiac defibrillator: Secondary | ICD-10-CM

## 2024-11-22 DIAGNOSIS — Z79899 Other long term (current) drug therapy: Secondary | ICD-10-CM

## 2024-11-22 LAB — COMPREHENSIVE METABOLIC PANEL WITH GFR
ALT: <5 U/L (ref 0–44)
AST: 24 U/L (ref 15–41)
Albumin: 3.4 g/dL — ABNORMAL LOW (ref 3.5–5.0)
Alkaline Phosphatase: 50 U/L (ref 38–126)
Anion gap: 10 (ref 5–15)
BUN: 13 mg/dL (ref 8–23)
CO2: 24 mmol/L (ref 22–32)
Calcium: 9.5 mg/dL (ref 8.9–10.3)
Chloride: 103 mmol/L (ref 98–111)
Creatinine, Ser: 0.87 mg/dL (ref 0.61–1.24)
GFR, Estimated: >60 mL/min
Glucose, Bld: 99 mg/dL (ref 70–99)
Potassium: 4.3 mmol/L (ref 3.5–5.1)
Sodium: 137 mmol/L (ref 135–145)
Total Bilirubin: 0.7 mg/dL (ref 0.0–1.2)
Total Protein: 7.9 g/dL (ref 6.5–8.1)

## 2024-11-22 LAB — CBC
HCT: 27.3 % — ABNORMAL LOW (ref 39.0–52.0)
Hemoglobin: 8.5 g/dL — ABNORMAL LOW (ref 13.0–17.0)
MCH: 31.8 pg (ref 26.0–34.0)
MCHC: 31.1 g/dL (ref 30.0–36.0)
MCV: 102.2 fL — ABNORMAL HIGH (ref 80.0–100.0)
Platelets: 143 K/uL — ABNORMAL LOW (ref 150–400)
RBC: 2.67 MIL/uL — ABNORMAL LOW (ref 4.22–5.81)
RDW: 15.5 % (ref 11.5–15.5)
WBC: 3.7 K/uL — ABNORMAL LOW (ref 4.0–10.5)
nRBC: 0 % (ref 0.0–0.2)

## 2024-11-22 LAB — TROPONIN T, HIGH SENSITIVITY
Troponin T High Sensitivity: 23 ng/L — ABNORMAL HIGH (ref 0–19)
Troponin T High Sensitivity: 24 ng/L — ABNORMAL HIGH (ref 0–19)

## 2024-11-22 LAB — D-DIMER, QUANTITATIVE: D-Dimer, Quant: 2.05 ug{FEU}/mL — ABNORMAL HIGH (ref 0.00–0.50)

## 2024-11-22 MED ORDER — LIDOCAINE HCL 2 % IJ SOLN
10.0000 mL | INTRAMUSCULAR | Status: DC
  Administered 2024-11-22: 10 mL

## 2024-11-22 MED ORDER — FENTANYL CITRATE (PF) 50 MCG/ML IJ SOSY
25.0000 ug | PREFILLED_SYRINGE | INTRAMUSCULAR | Status: DC | PRN
  Administered 2024-11-23 – 2024-11-24 (×3): 25 ug via INTRAVENOUS
  Filled 2024-11-22 (×4): qty 1

## 2024-11-22 MED ORDER — ACETAMINOPHEN 325 MG PO TABS
650.0000 mg | ORAL_TABLET | Freq: Four times a day (QID) | ORAL | Status: DC | PRN
  Administered 2024-11-23 – 2024-11-25 (×2): 650 mg via ORAL
  Filled 2024-11-22 (×2): qty 2

## 2024-11-22 MED ORDER — ONDANSETRON HCL 4 MG/2ML IJ SOLN
4.0000 mg | Freq: Four times a day (QID) | INTRAMUSCULAR | Status: DC | PRN
  Administered 2024-11-25 – 2024-11-28 (×4): 4 mg via INTRAVENOUS
  Filled 2024-11-22 (×4): qty 2

## 2024-11-22 MED ORDER — NALOXONE HCL 0.4 MG/ML IJ SOLN: 0.4000 mg | INTRAMUSCULAR | Status: DC | PRN

## 2024-11-22 MED ORDER — FENTANYL CITRATE (PF) 50 MCG/ML IJ SOSY
50.0000 ug | PREFILLED_SYRINGE | Freq: Once | INTRAMUSCULAR | Status: AC
  Administered 2024-11-22: 50 ug via INTRAVENOUS
  Filled 2024-11-22: qty 1

## 2024-11-22 MED ORDER — MELATONIN 3 MG PO TABS: 3.0000 mg | ORAL_TABLET | Freq: Every evening | ORAL | Status: DC | PRN

## 2024-11-22 MED ORDER — ISOSORBIDE MONONITRATE ER 30 MG PO TB24
30.0000 mg | ORAL_TABLET | Freq: Every day | ORAL | Status: DC
  Administered 2024-11-22 – 2024-11-26 (×5): 30 mg via ORAL
  Filled 2024-11-22 (×5): qty 1

## 2024-11-22 MED ORDER — METOPROLOL SUCCINATE ER 25 MG PO TB24
12.5000 mg | ORAL_TABLET | Freq: Every day | ORAL | Status: DC
  Administered 2024-11-22 – 2024-11-29 (×8): 12.5 mg via ORAL
  Filled 2024-11-22 (×8): qty 1

## 2024-11-22 MED ORDER — LIDOCAINE HCL 2 % IJ SOLN
10.0000 mL | Freq: Once | INTRAMUSCULAR | Status: AC
  Administered 2024-11-22: 200 mg
  Filled 2024-11-22: qty 20

## 2024-11-22 MED ORDER — FUROSEMIDE 10 MG/ML IJ SOLN
40.0000 mg | Freq: Once | INTRAMUSCULAR | Status: AC
  Administered 2024-11-22: 40 mg via INTRAVENOUS
  Filled 2024-11-22: qty 4

## 2024-11-22 MED ORDER — ACETAMINOPHEN 650 MG RE SUPP: 650.0000 mg | Freq: Four times a day (QID) | RECTAL | Status: DC | PRN

## 2024-11-22 MED ORDER — IOHEXOL 350 MG/ML SOLN
75.0000 mL | Freq: Once | INTRAVENOUS | Status: AC | PRN
  Administered 2024-11-22: 75 mL via INTRAVENOUS

## 2024-11-22 NOTE — ED Provider Triage Note (Signed)
 Emergency Medicine Provider Triage Evaluation Note  [name] , a 84 y.o. male  was evaluated in triage.  Pt w hx cad, lung ca, c/o chest pain. No sob. No fevers.   Review of Systems  Positive: cp Negative: Fevers.  Physical Exam  BP (!) 154/80   Pulse 75   Temp 98.3 F (36.8 C) (Oral)   Resp 18   SpO2 95%  Gen:   Awake, no distress   Resp:  Normal effort cta MSK:   No calf swelling/tenderness.  Other:  CV rrr  Medical Decision Making  Medically screening exam initiated at 11:09 AM.  Appropriate orders placed.  [name] was informed that the remainder of the evaluation will be completed by another provider, this initial triage assessment does not replace that evaluation, and the importance of remaining in the ED until their evaluation is complete.  Labs and imaging ordered.   Bernard Drivers, MD 11/22/24 1110

## 2024-11-22 NOTE — ED Notes (Signed)
 CT notified that pt has IV assess

## 2024-11-22 NOTE — ED Provider Notes (Signed)
 " [place] EMERGENCY DEPARTMENT AT [facility] Provider Note   CSN: [id number] Arrival date & time: 11/22/24  1050     Patient presents with: Chest Pain   [name] is a 84 y.o. male history of lung cancer, nonischemic cardiomyopathy with heart failure here presenting with shortness of breath and chest pain.  Patient has been having shortness of breath worse with minimal exertion for several days.  Patient denies any fevers or chills.  Patient had a elevated D-dimer and had a CTA chest done in triage.  Patient states that he is not currently on anticoagulation   The history is provided by the patient.       Prior to Admission medications  Medication Sig Start Date End Date Taking? Authorizing Provider  [Paused] apixaban  (ELIQUIS ) 2.5 MG TABS tablet Take 1 tablet (2.5 mg total) by mouth 2 (two) times daily. Wait to take this until your doctor or other care provider tells you to start again. 03/12/24   Croitoru, Mihai, MD  BREO ELLIPTA  100-25 MCG/INH AEPB Inhale 1 puff into the lungs every morning. 05/07/20   [provider]  brimonidine  (ALPHAGAN ) 0.2 % ophthalmic solution Place 1 drop into both eyes 2 (two) times daily. 03/29/23   [provider]  Continuous Glucose Sensor (FREESTYLE LIBRE 3 PLUS SENSOR) MISC SMARTSIG:Topical Every 3 Months 07/31/24   [provider]  cycloSPORINE  (RESTASIS ) 0.05 % ophthalmic emulsion Place 1 drop into both eyes 2 (two) times daily.    [provider]  dorzolamide -timolol  (COSOPT ) 22.3-6.8 MG/ML ophthalmic solution Place 1 drop into both eyes 2 (two) times daily. 03/14/20   [provider]  ENTRESTO  24-26 MG TAKE 1 TABLET BY MOUTH TWICE DAILY 11/08/23   Burnard Debby LABOR, MD  folic acid  (FOLVITE ) 1 MG tablet Take 1 tablet (1 mg total) by mouth daily. Start 7 days before pemetrexed  chemotherapy. Continue until 21 days after pemetrexed  completed. 06/13/24   Sherrod Sherrod, MD  furosemide  (LASIX ) 20 MG  tablet Take 0.5 tablets (10 mg total) by mouth daily. 02/27/24   Lue Elsie BROCKS, MD  Glucagon , rDNA, (GLUCAGON  EMERGENCY) 1 MG KIT Inject 1 mg into the skin as needed for up to 2 doses (Severe low blood sugar). 02/26/24   Lue Elsie BROCKS, MD  hydrocortisone  1 % lotion Apply 1 Application topically 2 (two) times daily. 07/25/24   Heilingoetter, Cassandra L, PA-C  insulin  isophane & regular human KwikPen (HUMULIN 70/30 MIX) (70-30) 100 UNIT/ML KwikPen Inject 25 Units into the skin in the morning and at bedtime. 02/27/24   Lue Elsie BROCKS, MD  isosorbide  mononitrate (IMDUR ) 30 MG 24 hr tablet Take 30 mg by mouth daily. Patient not taking: Reported on 11/01/2024 09/16/24   [provider]  lidocaine  (LIDODERM ) 5 % Place 1 patch onto the skin daily. Remove & Discard patch within 12 hours or as directed by MD 06/10/24   Caleen Burgess BROCKS, MD  metoprolol  succinate (TOPROL -XL) 25 MG 24 hr tablet Take 1 tablet (25 mg total) by mouth daily. 08/02/24   Croitoru, Mihai, MD  omeprazole  (PRILOSEC) 20 MG capsule TAKE 1 CAPSULE(20 MG) BY MOUTH DAILY 10/22/24   Heilingoetter, Cassandra L, PA-C  ondansetron  (ZOFRAN ) 8 MG tablet Take 1 tablet (8 mg total) by mouth every 8 (eight) hours as needed for nausea or vomiting. Start on the third day after carboplatin . 11/09/24   Heilingoetter, Cassandra L, PA-C  RHOPRESSA 0.02 % SOLN Apply 1 drop to eye every evening. 08/31/24  [provider]  ROCKLATAN  0.02-0.005 % SOLN Place 1 drop into both eyes at bedtime.    [provider]  spironolactone  (ALDACTONE ) 25 MG tablet Take 0.5 tablets (12.5 mg total) by mouth daily. 10/11/24   Croitoru, Mihai, MD  VYZULTA  0.024 % SOLN Apply 1 drop to eye at bedtime. 07/18/24   [provider]    Allergies: Paclitaxel , Allegra [fexofenadine], and Sonafine [wound dressings]    Review of Systems  Cardiovascular:  Positive for chest pain.  All other systems reviewed and are negative.   Updated Vital  Signs BP (!) 177/87 (BP Location: Right Arm)   Pulse 77   Temp 97.8 F (36.6 C)   Resp 18   SpO2 95%   Physical Exam Vitals and nursing note reviewed.  Constitutional:      Comments: Chronically ill and tachypneic  HENT:     Head: Normocephalic.  Eyes:     Extraocular Movements: Extraocular movements intact.     Pupils: Pupils are equal, round, and reactive to light.  Cardiovascular:     Rate and Rhythm: Normal rate and regular rhythm.     Heart sounds: Normal heart sounds.  Pulmonary:     Comments: Crackles bilateral bases Abdominal:     General: Bowel sounds are normal.     Palpations: Abdomen is soft.  Musculoskeletal:        General: Normal range of motion.     Cervical back: Normal range of motion and neck supple.  Skin:    General: Skin is warm.     Capillary Refill: Capillary refill takes less than 2 seconds.  Neurological:     General: No focal deficit present.     Mental Status: He is oriented to person, place, and time.  Psychiatric:        Mood and Affect: Mood normal.        Behavior: Behavior normal.     (all labs ordered are listed, but only abnormal results are displayed) Labs Reviewed  CBC - Abnormal; Notable for the following components:      Result Value   WBC 3.7 (*)    RBC 2.67 (*)    Hemoglobin 8.5 (*)    HCT 27.3 (*)    MCV 102.2 (*)    Platelets 143 (*)    All other components within normal limits  COMPREHENSIVE METABOLIC PANEL WITH GFR - Abnormal; Notable for the following components:   Albumin  3.4 (*)    All other components within normal limits  D-DIMER, QUANTITATIVE - Abnormal; Notable for the following components:   D-Dimer, Quant 2.05 (*)    All other components within normal limits  TROPONIN T, HIGH SENSITIVITY - Abnormal; Notable for the following components:   Troponin T High Sensitivity 23 (*)    All other components within normal limits  TROPONIN T, HIGH SENSITIVITY - Abnormal; Notable for the following components:    Troponin T High Sensitivity 24 (*)    All other components within normal limits  BODY FLUID CULTURE W GRAM STAIN  ALBUMIN , PLEURAL OR PERITONEAL FLUID   BODY FLUID CELL COUNT WITH DIFFERENTIAL  LACTATE DEHYDROGENASE, PLEURAL OR PERITONEAL FLUID  MISCELLANEOUS TEST  CYTOLOGY - NON PAP    EKG: EKG Interpretation Date/Time:  Thursday November 22 2024 10:54:58 EST Ventricular Rate:  74 PR Interval:  182 QRS Duration:  148 QT Interval:  454 QTC Calculation: 503 R Axis:   243  Text Interpretation: AV dual-paced rhythm with occasional Premature ventricular complexes  Confirmed by Bernard Drivers ([id number]) on 11/22/2024 11:06:31 AM  Radiology: [facility] Chest Port 1 View Result Date: 11/22/2024 EXAM: 1 VIEW(S) XRAY OF THE CHEST 11/22/2024 08:43:04 PM COMPARISON: 11/22/2024 CLINICAL HISTORY: s/p thoracentesis FINDINGS: LINES, TUBES AND DEVICES: Left chest AICD/pacemaker noted. LUNGS AND PLEURA: Stable interstitial prominence. Stable bibasilar opacities. Stable bilateral pleural effusions. No pneumothorax. HEART AND MEDIASTINUM: Left chest AICD/pacemaker noted. Stable cardiomegaly. Aortic atherosclerosis. BONES AND SOFT TISSUES: Partially visualized spinal fixation hardware in lower thoracic spine. No acute osseous abnormality. IMPRESSION: 1. Stable bilateral pleural effusions. 2. Stable central pulmonary vascular congestion and bibasilar airspace disease. 3. Stable cardiomegaly with aortic atherosclerosis. 4. Left chest AICD/pacemaker in place. Electronically signed by: Greig Pique MD 11/22/2024 08:54 PM EST RP Workstation: [id number]   CT Angio Chest PE W and/or Wo Contrast Result Date: 11/22/2024 EXAM: CTA CHEST 11/22/2024 05:06:29 PM TECHNIQUE: CTA of the chest was performed without and with the administration of 75 mL of iohexol  (OMNIPAQUE ) 350 MG/ML injection. Multiplanar reformatted images are provided for review. MIP images are provided for review. Automated exposure control, iterative reconstruction,  and/or weight based adjustment of the mA/kV was utilized to reduce the radiation dose to as low as reasonably achievable. COMPARISON: CT chest 08/29/2024. CLINICAL HISTORY: elevated dimer, chest pain FINDINGS: PULMONARY ARTERIES: Pulmonary arteries are adequately opacified for evaluation. No acute pulmonary embolus. Main pulmonary artery is normal in caliber. MEDIASTINUM: The heart is enlarged, unchanged. Coronary and aortic atherosclerotic calcifications are noted. Aorta is ectatic without focal dilatation. The pericardium demonstrates no acute abnormality. LYMPH NODES: Mildly enlarged low right paratracheal lymph node measures 10 mm, unchanged. No other new and large lymph nodes are seen. LUNGS AND PLEURA: There is a new moderate sized right pleural effusion. There is a new small left pleural effusion which is partially loculated. No pneumothorax. There are secretions in the trachea. Mild emphysema present. There is compressive atelectasis of the bilateral lower lobes. Masslike density in the lingula measures 3.9 x 4.1 cm and appears unchanged from prior. There is some new patchy airspace disease. Patchy airspace opacities in the left upper lobe and superior segment of the left lower lobe is not significantly changed. UPPER ABDOMEN: There is a 6 mm calculus in the right kidney. Limited images of the upper abdomen are otherwise unremarkable. SOFT TISSUES AND BONES: Left sided pacemaker is present. Lower thoracic spinal fusion hardware is present. No acute bone or soft tissue abnormality. IMPRESSION: 1. No evidence of pulmonary embolism. 2. New moderate right pleural effusion and small partially loculated left pleural effusion, with compressive atelectasis in the bilateral lower lobes. 3. Unchanged masslike density in the lingula. 4. Mildly enlarged low right paratracheal lymph node, unchanged. 5. Unchanged patchy airspace opacities in the left upper lobe and superior segment of the left lower lobe. Electronically  signed by: Greig Pique MD 11/22/2024 05:33 PM EST RP Workstation: [id number]   DG Chest 1 View Result Date: 11/22/2024 EXAM: 1 VIEW(S) XRAY OF THE CHEST 11/22/2024 12:20:00 PM COMPARISON: 10/06/2024 CLINICAL HISTORY: Pain FINDINGS: LINES, TUBES AND DEVICES: Left chest wall cardiac defibrillator in place. Left shoulder rotator cuff anchor suture noted. LUNGS AND PLEURA: Bilateral lower lung zone airspace opacities. Increased interstitial markings. Moderate bilateral pleural effusions. No pneumothorax. HEART AND MEDIASTINUM: Atherosclerotic plaque. No acute abnormality of the cardiac and mediastinal silhouettes. BONES AND SOFT TISSUES: Partially visualized thoracolumbar spinal fixation hardware and lumbar surgical hardware. No acute osseous abnormality. IMPRESSION: 1. Bilateral lower lung zone airspace opacities and increased interstitial markings chronically reflecting edema and  atelectasis. Lower lobe infection is not excluded. 2. Moderate bilateral pleural effusions. Electronically signed by: Lonni Necessary MD 11/22/2024 01:09 PM EST RP Workstation: [id number]     Thoracentesis Bedside  Date/Time: 11/22/2024 9:46 PM  Performed by: Patt Alm Macho, MD Authorized by: Patt Alm Macho, MD   Consent:    Consent obtained:  Written   Consent given by:  Parent   Risks discussed:  Bleeding, infection and pain   Alternatives discussed:  No treatment Universal protocol:    Procedure explained and questions answered to patient or proxy's satisfaction: yes     Relevant documents present and verified: yes     Test results available: yes     Imaging studies available: yes     Patient identity confirmed:  Verbally with patient Sedation:    Sedation type:  None Anesthesia:    Anesthesia method:  Local infiltration   Local anesthetic:  10 mL lidocaine  2 % Procedure details:    Preparation: Patient was prepped and draped in usual sterile fashion     Patient position:  Sitting   Location:  R  midaxillary line   Intercostal space:  8th   Puncture method:  Over-the-needle catheter   Ultrasound guidance: no     Indwelling catheter placed: no     Needle gauge:  20   Catheter size:  18 G   Number of attempts:  1   Drainage characteristics:  Bloody Post-procedure details:    Chest x-ray performed: yes     Chest x-ray findings:  Pleural effusion improved   Procedure completion:  Tolerated      Medications Ordered in the ED  iohexol  (OMNIPAQUE ) 350 MG/ML injection 75 mL (75 mLs Intravenous Contrast Given 11/22/24 1707)  lidocaine  (XYLOCAINE ) 2 % (with pres) injection 200 mg (200 mg Infiltration Given 11/22/24 2047)  fentaNYL  (SUBLIMAZE ) injection 50 mcg (50 mcg Intravenous Given 11/22/24 2045)                                    Medical Decision Making [name] is a 84 y.o. male here presenting with shortness of breath.  Patient has a history of lung cancer and has worsening shortness of breath.  Concerned that he may have recurrent cancer versus pneumonia versus PE.  Plan to get CBC and CMP and CTA chest  8 pm Patient has large pleural effusion on the right and loculated pleural effusion on the left.  I performed thoracentesis on the right side and was able to remove about 800 cc of serosanguineous fluid.  Fluid was sent for analysis.  However patient has more pain afterwards.  Will get chest x-ray to make sure he does not have pneumothorax  9:39 PM Chest x-ray showed no pneumothorax but persistent pleural effusion.  Patient is in quite a bit of pain still.  He still has a lot of fluid on his chest x-ray.  Patient will likely need repeat thoracentesis.  I did send off the thoracentesis to the lab.  Will give IV Lasix  as he appears volume overloaded.  Also ordered BNP.   Problems Addressed: Malignant neoplasm of lung, unspecified laterality, unspecified part of lung (HCC): acute illness or injury Pleural effusion: acute illness or injury  Amount and/or Complexity of Data  Reviewed Labs: ordered. Decision-making details documented in ED Course. Radiology: ordered and independent interpretation performed. Decision-making details documented in ED Course.  Risk Prescription drug management.  Decision regarding hospitalization.     Final diagnoses:  None    ED Discharge Orders     None          Patt Alm Macho, MD 11/22/24 2147  "

## 2024-11-22 NOTE — ED Triage Notes (Signed)
 Pt here from home with c/o chest pain worse with movement and deep breaths , pt received 324 mg asa by ems

## 2024-11-22 NOTE — ED Notes (Signed)
 IV team to bedside.

## 2024-11-22 NOTE — ED Notes (Signed)
 Tori RN unsuccessful with US  IV, IV team consult placed.

## 2024-11-22 NOTE — H&P (Signed)
 " History and Physical      [name] FMW:[id number] DOB: February 02, 1941 DOA: 11/22/2024; DOS: 11/22/2024  PCP: Rodney Aland, MD *** Patient coming from: home ***  I have personally briefly reviewed patient's old medical records in [facility]  Chief Complaint: ***  HPI: [name] is a 84 y.o. male with medical history significant for *** who is admitted to [facility] on 11/22/2024 with *** after presenting from home*** to [facility] ED complaining of ***.    ***       ***   ED Course:  Vital signs in the ED were notable for the following: ***  Labs were notable for the following: ***  Per my interpretation, EKG in ED demonstrated the following:  ***  Imaging in the ED, per corresponding formal radiology read, was notable for the following:  ***  While in the ED, the following were administered: ***  Subsequently, the patient was admitted  ***  ***red    Review of Systems: As per HPI otherwise 10 point review of systems negative.   Past Medical History:  Diagnosis Date   AICD (automatic cardioverter/defibrillator) present    CAD (coronary artery disease) 80% stenosis diag of the LAD, 30% in OM2 branch of LCX in 2009    a. Nonobstructive CAD by cath 11/2011 with the exception of the pre-existing diagonal branch #2 lesion.   Chronic systolic CHF (congestive heart failure) (HCC)    CKD (chronic kidney disease) stage 3, GFR 30-59 ml/min (HCC)    Colon polyp, hyperplastic    History of kidney stones    History of radiation therapy    Left Lung- 08/17/23-09/29/23- Dr. Lynwood Stevens   History of stress test 06/01/2012   Normal myocardial perfusion study. compared to the previous study there is no significant change. this is a low risk scan   Hypertension    Legally blind    both eyes   Myocardial infarction [facility]) 11/22/2011   NICM (nonischemic cardiomyopathy) (HCC)    a. Remote hx of dilated NICM with EF ranging 20-45%, including normal EF by echo  (55-60%) in 2014.   Peripheral arterial disease    a. 06/2014: ABI right 0.99, left 1.2, LE dopplers revealing an occluded right posterior tibial. As symptoms were not felt r/t claudication, no further w/u at the time.   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 Requiring discontinuation of BB dose.   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    Past Surgical History:  Procedure Laterality Date   BACK SURGERY     BIV UPGRADE N/A 11/08/2019   Procedure: BIV UPGRADE;  Surgeon: [name] ORN, MD;  [facility]: [facility] INVASIVE CV LAB;  Service: Cardiovascular;  Laterality: N/A;   BRONCHIAL BIOPSY  07/05/2023   Procedure: BRONCHIAL BIOPSIES;  Surgeon: Rodney Lamar RAMAN, MD;  Location: [facility] ENDOSCOPY;  Service: Pulmonary;;   BRONCHIAL BRUSHINGS  07/05/2023   Procedure: BRONCHIAL BRUSHINGS;  Surgeon: Rodney Lamar RAMAN, MD;  Location: [facility] ENDOSCOPY;  Service: Pulmonary;;   BRONCHIAL NEEDLE ASPIRATION BIOPSY  07/05/2023   Procedure: BRONCHIAL NEEDLE ASPIRATION BIOPSIES;  Surgeon: Rodney Lamar RAMAN, MD;  Location: [facility] ENDOSCOPY;  Service: Pulmonary;;   BRONCHIAL WASHINGS  07/05/2023   Procedure: BRONCHIAL WASHINGS;  Surgeon: Rodney Lamar RAMAN, MD;  Location: [facility] ENDOSCOPY;  Service: Pulmonary;;   CARDIAC CATHETERIZATION  11/2011   CARDIAC  CATHETERIZATION  11/2011   didn't demonstrate high grade obstructive disease to account for his LV dysfunction.   CATARACT EXTRACTION, BILATERAL  1990's   CYSTOSCOPY     CYSTOSCOPY WITH BIOPSY Bilateral 08/03/2021   Procedure: CYSTOSCOPY WITH BLADDER BIOPSY, BILATERAL RETROGRADE PYELOGRAM;  Surgeon: Rodney Sherwood JONETTA DOUGLAS, MD;  Location: [facility] ORS;  Service: Urology;  Laterality: Bilateral;  REQUESTING 45 MINS   CYSTOSCOPY WITH URETHRAL DILATATION N/A 05/04/2013   Procedure: CYSTOSCOPY WITH URETHRAL DILATATION;  Surgeon: [name] Molt, MD;  Location: [facility] NEURO ORS;   Service: Neurosurgery;  Laterality: N/A;  with insertion of foley catheter   EP study and ablation of VT  7/13   PVC focus mapped to the right coronary cusp of the aorta, limited ablation performed due to proximity of the focus to the right coronary artery   EYE SURGERY     FINE NEEDLE ASPIRATION  07/05/2023   Procedure: FINE NEEDLE ASPIRATION (FNA) LINEAR;  Surgeon: Rodney Lamar GORMAN, MD;  Location: [facility] ENDOSCOPY;  Service: Pulmonary;;   LEFT HEART CATH AND CORONARY ANGIOGRAPHY N/A 11/07/2019   Procedure: LEFT HEART CATH AND CORONARY ANGIOGRAPHY;  Surgeon: Rodney Deatrice LABOR, MD;  Location: [facility] INVASIVE CV LAB;  Service: Cardiovascular;  Laterality: N/A;   LEFT HEART CATHETERIZATION WITH CORONARY ANGIOGRAM N/A 11/25/2011   Procedure: LEFT HEART CATHETERIZATION WITH CORONARY ANGIOGRAM;  Surgeon: Rodney LELON Clay, MD;  Location: [facility] CATH LAB;  Service: Cardiovascular;  Laterality: N/A;   PACEMAKER IMPLANT N/A 07/12/2018   Procedure: PACEMAKER IMPLANT;  Surgeon: Rodney Headland, MD;  Location: [facility] INVASIVE CV LAB;  Service: Cardiovascular;  Laterality: N/A;   POSTERIOR FUSION LUMBAR SPINE  1979   ROTATOR CUFF REPAIR  2000's   left   TRANSESOPHAGEAL ECHOCARDIOGRAM (CATH LAB) N/A 10/11/2024   Procedure: TRANSESOPHAGEAL ECHOCARDIOGRAM;  Surgeon: Rodney Headland, MD;  Location: [facility] INVASIVE CV LAB;  Service: Cardiovascular;  Laterality: N/A;   V-TACH ABLATION N/A 06/06/2012   Procedure: V-TACH ABLATION;  Surgeon: Rodney Rakers, MD;  Location: [facility] CATH LAB;  Service: Cardiovascular;  Laterality: N/A;   VIDEO BRONCHOSCOPY WITH ENDOBRONCHIAL ULTRASOUND N/A 07/05/2023   Procedure: VIDEO BRONCHOSCOPY WITH ENDOBRONCHIAL ULTRASOUND;  Surgeon: Rodney Lamar GORMAN, MD;  Location: [facility] ENDOSCOPY;  Service: Pulmonary;  Laterality: N/A;    Social History:  reports that he quit smoking about 25 years ago. His smoking use included cigarettes. He started smoking about 75 years ago. He has been exposed to tobacco smoke. He has never  used smokeless tobacco. He reports that he does not drink alcohol and does not use drugs.   Allergies[1]  Family History  Problem Relation Age of Onset   Asthma Mother    Other Other        Unsure if any heart disease in his family.    Family history reviewed and not pertinent ***   Prior to Admission medications  Medication Sig Start Date End Date Taking? Authorizing Provider  [Paused] apixaban  (ELIQUIS ) 2.5 MG TABS tablet Take 1 tablet (2.5 mg total) by mouth 2 (two) times daily. Wait to take this until your doctor or other care provider tells you to start again. 03/12/24   Croitoru, Mihai, MD  BREO ELLIPTA  100-25 MCG/INH AEPB Inhale 1 puff into the lungs every morning. 05/07/20   [provider]  brimonidine  (ALPHAGAN ) 0.2 % ophthalmic solution Place 1 drop into both eyes 2 (two) times daily. 03/29/23   [provider]  Continuous Glucose Sensor (FREESTYLE LIBRE 3 PLUS SENSOR) MISC SMARTSIG:Topical Every 3  Months 07/31/24   [provider]  cycloSPORINE  (RESTASIS ) 0.05 % ophthalmic emulsion Place 1 drop into both eyes 2 (two) times daily.    [provider]  dorzolamide -timolol  (COSOPT ) 22.3-6.8 MG/ML ophthalmic solution Place 1 drop into both eyes 2 (two) times daily. 03/14/20   [provider]  ENTRESTO  24-26 MG TAKE 1 TABLET BY MOUTH TWICE DAILY 11/08/23   Burnard Debby LABOR, MD  folic acid  (FOLVITE ) 1 MG tablet Take 1 tablet (1 mg total) by mouth daily. Start 7 days before pemetrexed  chemotherapy. Continue until 21 days after pemetrexed  completed. 06/13/24   Sherrod Sherrod, MD  furosemide  (LASIX ) 20 MG tablet Take 0.5 tablets (10 mg total) by mouth daily. 02/27/24   Lue Elsie BROCKS, MD  Glucagon , rDNA, (GLUCAGON  EMERGENCY) 1 MG KIT Inject 1 mg into the skin as needed for up to 2 doses (Severe low blood sugar). 02/26/24   Lue Elsie BROCKS, MD  hydrocortisone  1 % lotion Apply 1 Application topically 2 (two) times daily. 07/25/24   Heilingoetter,  Cassandra L, PA-C  insulin  isophane & regular human KwikPen (HUMULIN 70/30 MIX) (70-30) 100 UNIT/ML KwikPen Inject 25 Units into the skin in the morning and at bedtime. 02/27/24   Lue Elsie BROCKS, MD  isosorbide  mononitrate (IMDUR ) 30 MG 24 hr tablet Take 30 mg by mouth daily. Patient not taking: Reported on 11/01/2024 09/16/24   [provider]  lidocaine  (LIDODERM ) 5 % Place 1 patch onto the skin daily. Remove & Discard patch within 12 hours or as directed by MD 06/10/24   Caleen Burgess BROCKS, MD  metoprolol  succinate (TOPROL -XL) 25 MG 24 hr tablet Take 1 tablet (25 mg total) by mouth daily. 08/02/24   Croitoru, Mihai, MD  omeprazole  (PRILOSEC) 20 MG capsule TAKE 1 CAPSULE(20 MG) BY MOUTH DAILY 10/22/24   Heilingoetter, Cassandra L, PA-C  ondansetron  (ZOFRAN ) 8 MG tablet Take 1 tablet (8 mg total) by mouth every 8 (eight) hours as needed for nausea or vomiting. Start on the third day after carboplatin . 11/09/24   Heilingoetter, Cassandra L, PA-C  RHOPRESSA 0.02 % SOLN Apply 1 drop to eye every evening. 08/31/24   [provider]  ROCKLATAN  0.02-0.005 % SOLN Place 1 drop into both eyes at bedtime.    [provider]  spironolactone  (ALDACTONE ) 25 MG tablet Take 0.5 tablets (12.5 mg total) by mouth daily. 10/11/24   Croitoru, Mihai, MD  VYZULTA  0.024 % SOLN Apply 1 drop to eye at bedtime. 07/18/24   [provider]     Objective    Physical Exam: Vitals:   11/22/24 1051 11/22/24 1401 11/22/24 1843  BP: (!) 154/80 (!) 143/77 (!) 177/87  Pulse: 75 73 77  Resp: 18 16 18   Temp: 98.3 F (36.8 C) 98.4 F (36.9 C) 97.8 F (36.6 C)  TempSrc: Oral    SpO2: 95% 97% 95%    General: appears to be stated age; alert, oriented Skin: warm, dry, no rash Head:  AT/[place] Mouth:  Oral mucosa membranes appear moist, normal dentition Neck: supple; trachea midline Heart:  RRR; did not appreciate any M/R/G Lungs: CTAB, did not appreciate any wheezes, rales, or  rhonchi Abdomen: + BS; soft, ND, NT Vascular: 2+ pedal pulses b/l; 2+ radial pulses b/l Extremities: no peripheral edema, no muscle wasting   ***   *** Neuro: strength and sensation intact in upper and lower extremities b/l  *** Neuro: 5/5 strength of the proximal and distal flexors and extensors of the upper and lower extremities  bilaterally; sensation intact in upper and lower extremities b/l; cranial nerves II through XII grossly intact; no pronator drift; no evidence suggestive of slurred speech, dysarthria, or facial droop; Normal muscle tone. No tremors. *** Neuro: In the setting of the patient's current mental status and associated inability to follow instructions, unable to perform full neurologic exam at this time.  As such, assessment of strength, sensation, and cranial nerves is limited at this time. Patient noted to spontaneously move all 4 extremities. No tremors.  ***        Labs on Admission: I have personally reviewed following labs and imaging studies  CBC: Recent Labs  Lab 11/22/24 1111  WBC 3.7*  HGB 8.5*  HCT 27.3*  MCV 102.2*  PLT 143*   Basic Metabolic Panel: Recent Labs  Lab 11/22/24 1111  NA 137  K 4.3  CL 103  CO2 24  GLUCOSE 99  BUN 13  CREATININE 0.87  CALCIUM  9.5   GFR: CrCl cannot be calculated (Unknown ideal weight.). Liver Function Tests: Recent Labs  Lab 11/22/24 1111  AST 24  ALT <5  ALKPHOS 50  BILITOT 0.7  PROT 7.9  ALBUMIN  3.4*   No results for input(s): LIPASE, AMYLASE in the last 168 hours. No results for input(s): AMMONIA in the last 168 hours. Coagulation Profile: No results for input(s): INR, PROTIME in the last 168 hours. Cardiac Enzymes: No results for input(s): CKTOTAL, CKMB, CKMBINDEX, TROPONINI in the last 168 hours. BNP (last 3 results) No results for input(s): PROBNP in the last 8760 hours. HbA1C: No results for input(s): HGBA1C in the last 72 hours. CBG: No results for  input(s): GLUCAP in the last 168 hours. Lipid Profile: No results for input(s): CHOL, HDL, LDLCALC, TRIG, CHOLHDL, LDLDIRECT in the last 72 hours. Thyroid  Function Tests: No results for input(s): TSH, T4TOTAL, FREET4, T3FREE, THYROIDAB in the last 72 hours. Anemia Panel: No results for input(s): VITAMINB12, FOLATE, FERRITIN, TIBC, IRON, RETICCTPCT in the last 72 hours. Urine analysis:    Component Value Date/Time   COLORURINE YELLOW 07/12/2024 1325   APPEARANCEUR HAZY (A) 07/12/2024 1325   LABSPEC 1.012 07/12/2024 1325   PHURINE 6.0 07/12/2024 1325   GLUCOSEU NEGATIVE 07/12/2024 1325   HGBUR NEGATIVE 07/12/2024 1325   BILIRUBINUR NEGATIVE 07/12/2024 1325   KETONESUR NEGATIVE 07/12/2024 1325   PROTEINUR NEGATIVE 07/12/2024 1325   UROBILINOGEN 0.2 11/22/2011 0247   NITRITE POSITIVE (A) 07/12/2024 1325   LEUKOCYTESUR TRACE (A) 07/12/2024 1325    Radiological Exams on Admission: DG Chest Port 1 View Result Date: 11/22/2024 EXAM: 1 VIEW(S) XRAY OF THE CHEST 11/22/2024 08:43:04 PM COMPARISON: 11/22/2024 CLINICAL HISTORY: s/p thoracentesis FINDINGS: LINES, TUBES AND DEVICES: Left chest AICD/pacemaker noted. LUNGS AND PLEURA: Stable interstitial prominence. Stable bibasilar opacities. Stable bilateral pleural effusions. No pneumothorax. HEART AND MEDIASTINUM: Left chest AICD/pacemaker noted. Stable cardiomegaly. Aortic atherosclerosis. BONES AND SOFT TISSUES: Partially visualized spinal fixation hardware in lower thoracic spine. No acute osseous abnormality. IMPRESSION: 1. Stable bilateral pleural effusions. 2. Stable central pulmonary vascular congestion and bibasilar airspace disease. 3. Stable cardiomegaly with aortic atherosclerosis. 4. Left chest AICD/pacemaker in place. Electronically signed by: Greig Pique MD 11/22/2024 08:54 PM EST RP Workstation: [id number]   CT Angio Chest PE W and/or Wo Contrast Result Date: 11/22/2024 EXAM: CTA CHEST 11/22/2024  05:06:29 PM TECHNIQUE: CTA of the chest was performed without and with the administration of 75 mL of iohexol  (OMNIPAQUE ) 350 MG/ML injection. Multiplanar reformatted images are provided for review. MIP images are provided for  review. Automated exposure control, iterative reconstruction, and/or weight based adjustment of the mA/kV was utilized to reduce the radiation dose to as low as reasonably achievable. COMPARISON: CT chest 08/29/2024. CLINICAL HISTORY: elevated dimer, chest pain FINDINGS: PULMONARY ARTERIES: Pulmonary arteries are adequately opacified for evaluation. No acute pulmonary embolus. Main pulmonary artery is normal in caliber. MEDIASTINUM: The heart is enlarged, unchanged. Coronary and aortic atherosclerotic calcifications are noted. Aorta is ectatic without focal dilatation. The pericardium demonstrates no acute abnormality. LYMPH NODES: Mildly enlarged low right paratracheal lymph node measures 10 mm, unchanged. No other new and large lymph nodes are seen. LUNGS AND PLEURA: There is a new moderate sized right pleural effusion. There is a new small left pleural effusion which is partially loculated. No pneumothorax. There are secretions in the trachea. Mild emphysema present. There is compressive atelectasis of the bilateral lower lobes. Masslike density in the lingula measures 3.9 x 4.1 cm and appears unchanged from prior. There is some new patchy airspace disease. Patchy airspace opacities in the left upper lobe and superior segment of the left lower lobe is not significantly changed. UPPER ABDOMEN: There is a 6 mm calculus in the right kidney. Limited images of the upper abdomen are otherwise unremarkable. SOFT TISSUES AND BONES: Left sided pacemaker is present. Lower thoracic spinal fusion hardware is present. No acute bone or soft tissue abnormality. IMPRESSION: 1. No evidence of pulmonary embolism. 2. New moderate right pleural effusion and small partially loculated left pleural effusion, with  compressive atelectasis in the bilateral lower lobes. 3. Unchanged masslike density in the lingula. 4. Mildly enlarged low right paratracheal lymph node, unchanged. 5. Unchanged patchy airspace opacities in the left upper lobe and superior segment of the left lower lobe. Electronically signed by: Greig Pique MD 11/22/2024 05:33 PM EST RP Workstation: [id number]   DG Chest 1 View Result Date: 11/22/2024 EXAM: 1 VIEW(S) XRAY OF THE CHEST 11/22/2024 12:20:00 PM COMPARISON: 10/06/2024 CLINICAL HISTORY: Pain FINDINGS: LINES, TUBES AND DEVICES: Left chest wall cardiac defibrillator in place. Left shoulder rotator cuff anchor suture noted. LUNGS AND PLEURA: Bilateral lower lung zone airspace opacities. Increased interstitial markings. Moderate bilateral pleural effusions. No pneumothorax. HEART AND MEDIASTINUM: Atherosclerotic plaque. No acute abnormality of the cardiac and mediastinal silhouettes. BONES AND SOFT TISSUES: Partially visualized thoracolumbar spinal fixation hardware and lumbar surgical hardware. No acute osseous abnormality. IMPRESSION: 1. Bilateral lower lung zone airspace opacities and increased interstitial markings chronically reflecting edema and atelectasis. Lower lobe infection is not excluded. 2. Moderate bilateral pleural effusions. Electronically signed by: Lonni Necessary MD 11/22/2024 01:09 PM EST RP Workstation: [id number]      Assessment/Plan   Principal Problem:   Pleural  effusion   ***            ***                     ***                      ***                     ***                     ***                     ***                      ***                     ***                     ***                     ***                     ***                    ***                   ***  DVT prophylaxis: SCD's ***  Code Status: Full code*** Family Communication: none*** Disposition Plan: Per Rounding Team Consults called: none***;  Admission status: ***     I SPENT GREATER THAN 75 *** MINUTES IN CLINICAL CARE TIME/MEDICAL DECISION-MAKING IN COMPLETING THIS ADMISSION.      Eva NOVAK Melinna Linarez DO [facility]  From 7PM - 7AM   11/22/2024, 9:53 PM   ***     [1]  Allergies Allergen Reactions   Paclitaxel  Other (See Comments) and Hypertension    Pt reported chest pain, back pain, and SOB during first infusion (see notes from 9/23). Infusion discontinued.    Allegra [Fexofenadine] Other (See Comments)    Makes him dizzy and feels like he will fall.   Sonafine [Wound Dressings] Itching    Itching and burning to skin  08/25/23   "

## 2024-11-23 ENCOUNTER — Other Ambulatory Visit: Payer: Self-pay

## 2024-11-23 ENCOUNTER — Observation Stay (HOSPITAL_COMMUNITY)

## 2024-11-23 ENCOUNTER — Inpatient Hospital Stay

## 2024-11-23 DIAGNOSIS — R54 Age-related physical debility: Secondary | ICD-10-CM | POA: Diagnosis present

## 2024-11-23 DIAGNOSIS — D638 Anemia in other chronic diseases classified elsewhere: Secondary | ICD-10-CM | POA: Diagnosis present

## 2024-11-23 DIAGNOSIS — C3492 Malignant neoplasm of unspecified part of left bronchus or lung: Secondary | ICD-10-CM | POA: Diagnosis not present

## 2024-11-23 DIAGNOSIS — H548 Legal blindness, as defined in USA: Secondary | ICD-10-CM | POA: Diagnosis present

## 2024-11-23 DIAGNOSIS — D61818 Other pancytopenia: Secondary | ICD-10-CM | POA: Diagnosis present

## 2024-11-23 DIAGNOSIS — Z923 Personal history of irradiation: Secondary | ICD-10-CM | POA: Diagnosis not present

## 2024-11-23 DIAGNOSIS — Z79899 Other long term (current) drug therapy: Secondary | ICD-10-CM | POA: Diagnosis not present

## 2024-11-23 DIAGNOSIS — I110 Hypertensive heart disease with heart failure: Secondary | ICD-10-CM | POA: Diagnosis present

## 2024-11-23 DIAGNOSIS — R64 Cachexia: Secondary | ICD-10-CM | POA: Diagnosis present

## 2024-11-23 DIAGNOSIS — Z825 Family history of asthma and other chronic lower respiratory diseases: Secondary | ICD-10-CM | POA: Diagnosis not present

## 2024-11-23 DIAGNOSIS — Z9221 Personal history of antineoplastic chemotherapy: Secondary | ICD-10-CM | POA: Diagnosis not present

## 2024-11-23 DIAGNOSIS — C799 Secondary malignant neoplasm of unspecified site: Secondary | ICD-10-CM | POA: Diagnosis present

## 2024-11-23 DIAGNOSIS — Z7901 Long term (current) use of anticoagulants: Secondary | ICD-10-CM | POA: Diagnosis not present

## 2024-11-23 DIAGNOSIS — R0602 Shortness of breath: Secondary | ICD-10-CM | POA: Diagnosis present

## 2024-11-23 DIAGNOSIS — Z515 Encounter for palliative care: Secondary | ICD-10-CM | POA: Diagnosis not present

## 2024-11-23 DIAGNOSIS — I2510 Atherosclerotic heart disease of native coronary artery without angina pectoris: Secondary | ICD-10-CM | POA: Diagnosis present

## 2024-11-23 DIAGNOSIS — I509 Heart failure, unspecified: Secondary | ICD-10-CM

## 2024-11-23 DIAGNOSIS — I5043 Acute on chronic combined systolic (congestive) and diastolic (congestive) heart failure: Secondary | ICD-10-CM | POA: Diagnosis present

## 2024-11-23 DIAGNOSIS — C3412 Malignant neoplasm of upper lobe, left bronchus or lung: Secondary | ICD-10-CM | POA: Diagnosis present

## 2024-11-23 DIAGNOSIS — J95811 Postprocedural pneumothorax: Secondary | ICD-10-CM | POA: Diagnosis not present

## 2024-11-23 DIAGNOSIS — Y848 Other medical procedures as the cause of abnormal reaction of the patient, or of later complication, without mention of misadventure at the time of the procedure: Secondary | ICD-10-CM | POA: Diagnosis not present

## 2024-11-23 DIAGNOSIS — I739 Peripheral vascular disease, unspecified: Secondary | ICD-10-CM | POA: Diagnosis present

## 2024-11-23 DIAGNOSIS — J910 Malignant pleural effusion: Secondary | ICD-10-CM | POA: Diagnosis present

## 2024-11-23 DIAGNOSIS — Z794 Long term (current) use of insulin: Secondary | ICD-10-CM | POA: Diagnosis not present

## 2024-11-23 DIAGNOSIS — I428 Other cardiomyopathies: Secondary | ICD-10-CM | POA: Diagnosis present

## 2024-11-23 DIAGNOSIS — J90 Pleural effusion, not elsewhere classified: Secondary | ICD-10-CM | POA: Diagnosis present

## 2024-11-23 DIAGNOSIS — Z95810 Presence of automatic (implantable) cardiac defibrillator: Secondary | ICD-10-CM | POA: Diagnosis not present

## 2024-11-23 DIAGNOSIS — Z87891 Personal history of nicotine dependence: Secondary | ICD-10-CM | POA: Diagnosis not present

## 2024-11-23 DIAGNOSIS — Z66 Do not resuscitate: Secondary | ICD-10-CM | POA: Diagnosis present

## 2024-11-23 HISTORY — PX: IR THORACENTESIS ASP PLEURAL SPACE W/IMG GUIDE: IMG5380

## 2024-11-23 LAB — CBC WITH DIFFERENTIAL/PLATELET
Abs Immature Granulocytes: 0.02 K/uL (ref 0.00–0.07)
Basophils Absolute: 0 K/uL (ref 0.0–0.1)
Basophils Relative: 0 %
Eosinophils Absolute: 0 K/uL (ref 0.0–0.5)
Eosinophils Relative: 0 %
HCT: 23.4 % — ABNORMAL LOW (ref 39.0–52.0)
Hemoglobin: 7.2 g/dL — ABNORMAL LOW (ref 13.0–17.0)
Immature Granulocytes: 0 %
Lymphocytes Relative: 10 %
Lymphs Abs: 0.5 K/uL — ABNORMAL LOW (ref 0.7–4.0)
MCH: 31.4 pg (ref 26.0–34.0)
MCHC: 30.8 g/dL (ref 30.0–36.0)
MCV: 102.2 fL — ABNORMAL HIGH (ref 80.0–100.0)
Monocytes Absolute: 0.4 K/uL (ref 0.1–1.0)
Monocytes Relative: 9 %
Neutro Abs: 4 K/uL (ref 1.7–7.7)
Neutrophils Relative %: 81 %
Platelets: 167 K/uL (ref 150–400)
RBC: 2.29 MIL/uL — ABNORMAL LOW (ref 4.22–5.81)
RDW: 15.5 % (ref 11.5–15.5)
WBC: 4.9 K/uL (ref 4.0–10.5)
nRBC: 0.4 % — ABNORMAL HIGH (ref 0.0–0.2)

## 2024-11-23 LAB — COMPREHENSIVE METABOLIC PANEL WITH GFR
ALT: <5 U/L (ref 0–44)
AST: 26 U/L (ref 15–41)
Albumin: 3.1 g/dL — ABNORMAL LOW (ref 3.5–5.0)
Alkaline Phosphatase: 43 U/L (ref 38–126)
Anion gap: 13 (ref 5–15)
BUN: 13 mg/dL (ref 8–23)
CO2: 23 mmol/L (ref 22–32)
Calcium: 9.2 mg/dL (ref 8.9–10.3)
Chloride: 101 mmol/L (ref 98–111)
Creatinine, Ser: 0.89 mg/dL (ref 0.61–1.24)
GFR, Estimated: >60 mL/min
Glucose, Bld: 132 mg/dL — ABNORMAL HIGH (ref 70–99)
Potassium: 4.4 mmol/L (ref 3.5–5.1)
Sodium: 136 mmol/L (ref 135–145)
Total Bilirubin: 0.7 mg/dL (ref 0.0–1.2)
Total Protein: 7.4 g/dL (ref 6.5–8.1)

## 2024-11-23 LAB — PROCALCITONIN: Procalcitonin: 0.12 ng/mL

## 2024-11-23 LAB — BODY FLUID CELL COUNT WITH DIFFERENTIAL
Eos, Fluid: 1 %
Eos, Fluid: 0 %
Lymphs, Fluid: 41 %
Lymphs, Fluid: 93 %
Monocyte-Macrophage-Serous Fluid: 11 % — ABNORMAL LOW (ref 50–90)
Monocyte-Macrophage-Serous Fluid: 5 % — ABNORMAL LOW (ref 50–90)
Neutrophil Count, Fluid: 46 % — ABNORMAL HIGH (ref 0–25)
Neutrophil Count, Fluid: 2 % (ref 0–25)
Other Cells, Fluid: 1 %
Total Nucleated Cell Count, Fluid: UNDETERMINED uL (ref 0–1000)
Total Nucleated Cell Count, Fluid: 175 uL (ref 0–1000)

## 2024-11-23 LAB — PROTIME-INR
INR: 1.3 — ABNORMAL HIGH (ref 0.8–1.2)
Prothrombin Time: 17.3 s — ABNORMAL HIGH (ref 11.4–15.2)

## 2024-11-23 LAB — PREPARE RBC (CROSSMATCH)

## 2024-11-23 LAB — LACTATE DEHYDROGENASE
LDH: 300 U/L — ABNORMAL HIGH (ref 105–235)
LDH: 363 U/L — ABNORMAL HIGH (ref 105–235)

## 2024-11-23 LAB — APTT: aPTT: 43 s — ABNORMAL HIGH (ref 24–36)

## 2024-11-23 LAB — PRO BRAIN NATRIURETIC PEPTIDE
Pro Brain Natriuretic Peptide: 3724 pg/mL — ABNORMAL HIGH
Pro Brain Natriuretic Peptide: 3736 pg/mL — ABNORMAL HIGH

## 2024-11-23 LAB — GRAM STAIN

## 2024-11-23 LAB — FOLATE: Folate: 10.5 ng/mL

## 2024-11-23 LAB — VITAMIN B12: Vitamin B-12: 1518 pg/mL — ABNORMAL HIGH (ref 180–914)

## 2024-11-23 LAB — MAGNESIUM
Magnesium: 1.8 mg/dL (ref 1.7–2.4)
Magnesium: 1.7 mg/dL (ref 1.7–2.4)

## 2024-11-23 LAB — PHOSPHORUS: Phosphorus: 4.1 mg/dL (ref 2.5–4.6)

## 2024-11-23 MED ORDER — LIDOCAINE-EPINEPHRINE 1 %-1:100000 IJ SOLN
20.0000 mL | Freq: Once | INTRAMUSCULAR | Status: AC
  Administered 2024-11-23: 20 mL via INTRADERMAL

## 2024-11-23 MED ORDER — ENOXAPARIN SODIUM 40 MG/0.4ML IJ SOSY
40.0000 mg | PREFILLED_SYRINGE | INTRAMUSCULAR | Status: DC
  Administered 2024-11-24 – 2024-11-29 (×6): 40 mg via SUBCUTANEOUS
  Filled 2024-11-23 (×6): qty 0.4

## 2024-11-23 MED ORDER — FLUTICASONE FUROATE-VILANTEROL 100-25 MCG/ACT IN AEPB
1.0000 | INHALATION_SPRAY | Freq: Every morning | RESPIRATORY_TRACT | Status: DC
  Administered 2024-11-24 – 2024-11-29 (×5): 1 via RESPIRATORY_TRACT
  Filled 2024-11-23: qty 28

## 2024-11-23 MED ORDER — CETIRIZINE HCL 10 MG PO TABS
10.0000 mg | ORAL_TABLET | Freq: Once | ORAL | Status: AC
  Administered 2024-11-24: 10 mg via ORAL
  Filled 2024-11-23: qty 1

## 2024-11-23 MED ORDER — DORZOLAMIDE HCL-TIMOLOL MAL 2-0.5 % OP SOLN
1.0000 [drp] | Freq: Two times a day (BID) | OPHTHALMIC | Status: DC
  Administered 2024-11-23 – 2024-11-29 (×11): 1 [drp] via OPHTHALMIC
  Filled 2024-11-23 (×2): qty 10

## 2024-11-23 MED ORDER — FUROSEMIDE 10 MG/ML IJ SOLN
40.0000 mg | Freq: Two times a day (BID) | INTRAMUSCULAR | Status: DC
  Administered 2024-11-23 – 2024-11-25 (×5): 40 mg via INTRAVENOUS
  Filled 2024-11-23 (×5): qty 4

## 2024-11-23 MED ORDER — LIDOCAINE-EPINEPHRINE 1 %-1:100000 IJ SOLN
INTRAMUSCULAR | Status: AC
  Filled 2024-11-23: qty 20

## 2024-11-23 MED ORDER — CYCLOSPORINE 0.05 % OP EMUL
1.0000 [drp] | Freq: Two times a day (BID) | OPHTHALMIC | Status: DC
  Administered 2024-11-23 – 2024-11-29 (×13): 1 [drp] via OPHTHALMIC
  Filled 2024-11-23 (×16): qty 30

## 2024-11-23 MED ORDER — SODIUM CHLORIDE 0.9% IV SOLUTION: Freq: Once | INTRAVENOUS | Status: AC

## 2024-11-23 MED ORDER — SACUBITRIL-VALSARTAN 24-26 MG PO TABS: 1.0000 | ORAL_TABLET | Freq: Two times a day (BID) | ORAL | Status: DC

## 2024-11-23 MED ORDER — NETARSUDIL DIMESYLATE 0.02 % OP SOLN: 1.0000 [drp] | Freq: Every evening | OPHTHALMIC | Status: DC

## 2024-11-23 NOTE — TOC Initial Note (Addendum)
 Transition of Care (TOC) - Initial/Assessment Note   Spoke to patient at beside. From home with wife who can assist.   Patient has walker, cane, crutches and walk in shower at home.   Patient currently on oxygen. Patient does not have home oxygen. If needed will need oxygen saturation ambulation note and MD order   Recent discharge home. [facility] discharged on 11/20/24 and [facility] discharged 11/19/24   Pam with [facility] aware of patient's admission.   PT recommending HHPT at dischsarge. Patient prefers [facility], if [facility] cannot accept no preference.   NCM sent referral to [facility] with [facility] await determination. [facility] accepted . Secure chatted MD for orders   Inpatient Care Management team will continue to follow   Patient Details  Name: [name] MRN: [id number] Date of Birth: Jun 03, 1941  Transition of Care [facility]) CM/SW Contact:    Stephane Powell Jansky, RN Phone Number: 11/23/2024, 3:23 PM  Clinical Narrative:                   Expected Discharge Plan: Home w Home Health Services Barriers to Discharge: Continued Medical Work up   Patient Goals and CMS Choice Patient states their goals for this hospitalization and ongoing recovery are:: to return to home CMS Medicare.gov Compare Post Acute Care list provided to:: Patient Choice offered to / list presented to : Patient      Expected Discharge Plan and Services   Discharge Planning Services: CM Consult Post Acute Care Choice: Home Health Living arrangements for the past 2 months: Apartment                 DME Arranged: N/A DME Agency: NA       HH Arranged: PT HH Agency: [facility] (awaiting determination) Date HH Agency Contacted: 11/23/24 Time HH Agency Contacted: 1522 Representative spoke with at [facility] Agency: [facility] awaiting determination  Prior Living Arrangements/Services Living arrangements for the past 2 months: Apartment Lives with:: Spouse Patient language and need for interpreter  reviewed:: Yes Do you feel safe going back to the place where you live?: Yes      Need for Family Participation in Patient Care: Yes (Comment) Care giver support system in place?: Yes (comment) Current home services: DME Criminal Activity/Legal Involvement Pertinent to Current Situation/Hospitalization: No - Comment as needed  Activities of Daily Living   ADL Screening (condition at time of admission) Independently performs ADLs?: Yes (appropriate for developmental age) Is the patient deaf or have difficulty hearing?: No Does the patient have difficulty seeing, even when wearing glasses/contacts?: No Does the patient have difficulty concentrating, remembering, or making decisions?: No  Permission Sought/Granted   Permission granted to share information with : Yes, Verbal Permission Granted     Permission granted to share info w AGENCY: [facility] , if [facility] cannot accept referral , other home health agencies        Emotional Assessment Appearance:: Appears stated age Attitude/Demeanor/Rapport: Engaged Affect (typically observed): Appropriate Orientation: : Oriented to Self, Oriented to Place, Oriented to  Time, Oriented to Situation Alcohol / Substance Use: Not Applicable Psych Involvement: No (comment)  Admission diagnosis:  CHF (congestive heart failure) (HCC) [I50.9] Pleural effusion [J90] Malignant neoplasm of lung, unspecified laterality, unspecified part of lung (HCC) [C34.90] Patient Active Problem List   Diagnosis Date Noted   SOB (shortness of breath) 11/23/2024   Anemia of chronic disease 11/23/2024   CHF (congestive heart failure) (HCC) 11/23/2024   Pleural effusion 11/22/2024  Bacteremia due to Staphylococcus 10/11/2024   Cellulitis of right leg 10/07/2024   Pancytopenia (HCC) 10/07/2024   Cellulitis 10/06/2024   Aortic atherosclerosis 08/02/2024   Chest pain 06/09/2024   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) 02/23/2024    Chemotherapy induced neutropenia 09/05/2023   Encounter for antineoplastic chemotherapy 08/23/2023   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) 08/03/2023   Goals of care, counseling/discussion 08/03/2023   Costochondritis 04/29/2023   Abnormal CT of the chest 09/04/2020   COPD (chronic obstructive pulmonary disease) (HCC) 09/04/2020   ICD (implantable cardioverter-defibrillator) in place    Cardiac syncope 11/07/2019   Closed fracture of distal fibula 11/07/2019   Pain due to onychomycosis of toenails of both feet 05/16/2019   Coagulation disorder 05/16/2019   Paroxysmal atrial fibrillation (HCC) 08/02/2018   NSVT (nonsustained ventricular tachycardia) (HCC) 08/02/2018   Symptomatic bradycardia 07/12/2018   Pacemaker 07/12/2018   Tachycardia-bradycardia syndrome (HCC)    Unsteady gait 03/12/2016   Nausea vomiting and diarrhea 03/11/2016   Acute lower UTI 03/11/2016   Gastroenteritis 03/11/2016   Obstipation 03/11/2016   Abdominal pain    Constipation    Lactic acidosis    Leg pain    Essential hypertension 03/04/2016   Paroxysmal ventricular tachycardia (HCC) 04/11/2015   Chest pain with moderate risk for cardiac etiology 04/09/2015   CAP (community acquired pneumonia) 02/18/2015   Chest pain at rest 02/18/2015   Peripheral arterial disease 08/15/2014   Hyperlipidemia with target LDL less than 70 01/01/2014   GERD (gastroesophageal reflux disease) 01/01/2014   First degree AV block 07/31/2012   Unstable angina, negative MI 06/05/2012   Acute on chronic combined systolic and diastolic CHF (congestive heart failure) (HCC) 06/05/2012   Second degree AV block, Mobitz type I 05/27/2012   Chronic kidney disease, stage 3b (HCC) 05/27/2012   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; 20% January 2013 35% April 2013 - normalized in 2014 05/26/2012   Chronic diastolic CHF (congestive heart failure) (HCC) 11/23/2011    Class: Diagnosis of   Coronary artery  disease involving native coronary artery of native heart without angina pectoris 11/22/2011   Premature ventricular contractions 11/22/2011   Esophageal reflux 07/28/2011   PCP:  [name], MD Pharmacy:   [facility] [id number] - [place], [place] - [address] AT [facility] [address]  [place] [address] Phone: [phone] Fax: [phone]  [id number] [place], [place] - [address] AT [facility] OF [address] [place] [address] Phone: [phone] Fax: [phone]  [place] [facility] [address] [place] [address] Phone: [phone] Fax: [phone]     Social Drivers of Health (SDOH) Social History: SDOH Screenings   Food Insecurity: No Food Insecurity (11/23/2024)  Housing: Low Risk (11/23/2024)  Transportation Needs: No Transportation Needs (11/23/2024)  Utilities: Not At Risk (11/23/2024)  Alcohol Screen: Low Risk (08/04/2023)  Depression (PHQ2-9): Low Risk (09/22/2024)  Social Connections: Socially Integrated (11/23/2024)  Tobacco Use: Medium Risk (11/23/2024)   SDOH Interventions:     Readmission Risk Interventions    10/09/2024    9:20 AM  Readmission Risk Prevention Plan  Transportation Screening Complete  PCP or Specialist Appt within 5-7 Days Complete  Home Care Screening Complete  Medication Review (RN CM) Complete

## 2024-11-23 NOTE — ED Notes (Signed)
 PT HAS EYEDROPS AT THE BEDSIDE THAT NEED TO BE TRANSPORTED TO THE FLOOR WITH HIM WHEN HE GETS A BED.

## 2024-11-23 NOTE — Procedures (Signed)
 PROCEDURE SUMMARY:  Successful image-guided left thoracentesis. Yielded 0.30 liters of hazy, straw-colored pleural fluid. Patient tolerated procedure well. EBL: Zero No immediate complications.  Specimen was sent for labs. Post procedure CXR is currently pending.  Please see imaging section of Epic for full dictation.  Theona Muhs A Tarryn Bogdan PA-C 11/23/2024 9:05 AM

## 2024-11-23 NOTE — Evaluation (Signed)
 Physical Therapy Evaluation Patient Details Name: [name] MRN: [id number] DOB: 03-19-1941 Today's Date: 11/23/2024  History of Present Illness  Pt is 84 yo presenting to [facility] on 1/1 due to worsening shortness of breathe. PT was found to be tachycardic/tachypneic in the ED. No PE Moderate R and partially loculated L effusion with compressive atelectasis. S/P R thoracentesis with 800 ML drained. Then L thoracentesis while in ER. Pmh: CHF, VT, AICD, chronic anemia, Stave III/IV adeno CA L lung undergoing chemotherapy.  Clinical Impression  Pt is currently presenting at Mod I for bed mobility, sit to stand and short distanced gait with RW. O2 sats remained above 92% on room air during gait. Pt is limited by pain. Pt has 10 steps to navigate to get into home; has assistance from significant other 24/7. Due to pt current functional status, home set up and available assistance at home recommending skilled physical therapy services 3x/week in order to address strength, balance and functional mobility to decrease risk for falls, injury and re-hospitalization.         If plan is discharge home, recommend the following: A little help with walking and/or transfers;Help with stairs or ramp for entrance;Assistance with cooking/housework;Assist for transportation     Equipment Recommendations None recommended by PT     Functional Status Assessment Patient has had a recent decline in their functional status and demonstrates the ability to make significant improvements in function in a reasonable and predictable amount of time.     Precautions / Restrictions Precautions Precautions: Fall Recall of Precautions/Restrictions: Intact Restrictions Weight Bearing Restrictions Per Provider Order: No      Mobility  Bed Mobility Overal bed mobility: Modified Independent      Transfers Overall transfer level: Modified independent Equipment used: Rolling walker (2 wheels)     Ambulation/Gait Ambulation/Gait assistance: Modified independent (Device/Increase time) Gait Distance (Feet): 30 Feet Assistive device: Rolling walker (2 wheels) Gait Pattern/deviations: Step-through pattern, Decreased stride length Gait velocity: decreased Gait velocity interpretation: 1.31 - 2.62 ft/sec, indicative of limited community ambulator   General Gait Details: O2 sats remained in the 90's on room air with short distance giat.  Stairs Stairs:  (did not perform today; with sit to stand and gait demonstrates good strength to perform stairs per home set up wtih Min A to CGA)             Balance Overall balance assessment: Modified Independent, No apparent balance deficits (not formally assessed)         Pertinent Vitals/Pain Pain Assessment Pain Assessment: 0-10 Pain Score: 8  Pain Location: at site of thoracentesis bil Pain Descriptors / Indicators: Aching, Discomfort Pain Intervention(s): Monitored during session, Limited activity within patient's tolerance    Home Living Family/patient expects to be discharged to:: Private residence Living Arrangements: Spouse/significant other Available Help at Discharge: Family;Available 24 hours/day Type of Home: Apartment Home Access: Stairs to enter Entrance Stairs-Rails: Right;Left;Can reach both Entrance Stairs-Number of Steps: 10   Home Layout: One level Home Equipment: Agricultural Consultant (2 wheels);Cane - single point Additional Comments: significant other taking FMLA    Prior Function Prior Level of Function : Independent/Modified Independent;Needs assist             Mobility Comments: been using SPC ADLs Comments: Significant other helps with sponge bathing     Extremity/Trunk Assessment   Upper Extremity Assessment Upper Extremity Assessment: Overall WFL for tasks assessed    Lower Extremity Assessment Lower Extremity Assessment: Overall WFL for tasks assessed  Cervical / Trunk  Assessment Cervical / Trunk Assessment: Normal  Communication   Communication Communication: No apparent difficulties    Cognition Arousal: Alert Behavior During Therapy: WFL for tasks assessed/performed   PT - Cognitive impairments: No apparent impairments       Following commands: Intact       Cueing Cueing Techniques: Verbal cues     General Comments General comments (skin integrity, edema, etc.): O2 sats remained above 92% on room air during gait with RW.        Assessment/Plan    PT Assessment Patient needs continued PT services  PT Problem List Decreased strength;Decreased activity tolerance;Decreased mobility;Pain       PT Treatment Interventions DME instruction;Therapeutic activities;Gait training;Therapeutic exercise;Patient/family education;Stair training;Balance training;Functional mobility training    PT Goals (Current goals can be found in the Care Plan section)  Acute Rehab PT Goals Patient Stated Goal: improve mobility and return home. PT Goal Formulation: With patient Time For Goal Achievement: 12/07/24 Potential to Achieve Goals: Good    Frequency Min 2X/week        AM-PAC PT 6 Clicks Mobility  Outcome Measure Help needed turning from your back to your side while in a flat bed without using bedrails?: None Help needed moving from lying on your back to sitting on the side of a flat bed without using bedrails?: None Help needed moving to and from a bed to a chair (including a wheelchair)?: None Help needed standing up from a chair using your arms (e.g., wheelchair or bedside chair)?: None Help needed to walk in hospital room?: A Little Help needed climbing 3-5 steps with a railing? : A Little 6 Click Score: 22    End of Session Equipment Utilized During Treatment: Gait belt Activity Tolerance: Patient tolerated treatment well Patient left: in bed;with call bell/phone within reach Nurse Communication: Mobility status PT Visit Diagnosis:  Other abnormalities of gait and mobility (R26.89);Muscle weakness (generalized) (M62.81)    Time: 8744-8686 PT Time Calculation (min) (ACUTE ONLY): 18 min   Charges:   PT Evaluation $PT Eval Low Complexity: 1 Low   PT General Charges $$ ACUTE PT VISIT: 1 Visit         Dorothyann Maier, DPT, CLT  Acute Rehabilitation Services Office: [phone] (Secure chat preferred)   Dorothyann VEAR Maier 11/23/2024, 2:18 PM

## 2024-11-23 NOTE — Progress Notes (Signed)
 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  In the ED he was tachycardic, tachypneic, labs noted creatinine of 0.8, troponin 23, proBNP 3724, D-dimer 2.0, hemoglobin 8.5, CTA chest noted no PE, moderate right and partially loculated left effusion with compressive atelectasis.  Underwent right-sided thoracentesis by EDP, 800 mL fluid drained. - Subsequently had left thoracentesis per IR while in the ER   Subjective: -Still continues to have chest pain and discomfort  Assessment and Plan:  Acute on chronic combined systolic/diastolic CHF - Last echo 11/25 noted EF 45-50%, grade 1 DD, normal RV - Suspect his pleural effusions are multifactorial likely related to both malignancy as well as CHF - Continue IV Lasix  today, continue Toprol  - Hold Imdur  and Entresto  with soft BPs  Bilateral pleural effusions -sp thoracentesis on both sides this morning in the ER - Follow-up fluid studies, cultures and cytology - Suspect multifactorial etiology-both CHF as well as malignancy could be contributing  Acute on chronic anemia History of iron deficiency anemia -Hemoglobin trending down to 7.2 this morning, check anemia panel will transfuse 1 unit PRBC  Adeno CA left lung, stage IV -sp XRT 9/24-11/24 - Was recently undergoing chemotherapy, currently on hold in the setting of recent bacteremia - Followed by Dr. Gatha  History of paroxysmal ventricular tachycardia - Monitor electrolytes, continue Toprol   Recent bacteremia, staph lugdunensis - Completed 6 weeks of cefazolin , TEE was negative    DVT prophylaxis: Add Lovenox  to start tomorrow Code Status: Discussed CODE STATUS with the patient, he wishes to have a natural death, changed to  DNR Family Communication: Discussed with patient detail, no family at bedside Disposition Plan: To be determined  Consultants:    Procedures: Right and left thoracentesis 1/2  Antimicrobials:    Objective: Vitals:   11/23/24 0745 11/23/24 0800 11/23/24 0815 11/23/24 0944  BP: 115/72 113/77 (!) 122/92   Pulse: 100 100 100   Resp: (!) 21 (!) 25 19   Temp:    97.9 F (36.6 C)  TempSrc:    Oral  SpO2: 100% 100% 100%     Intake/Output Summary (Last 24 hours) at 11/23/2024 1139 Last data filed at 11/22/2024 2322 Gross per 24 hour  Intake --  Output 650 ml  Net -650 ml   There were no vitals filed for this visit.  Examination:  General exam: Elderly chronically ill, AAO x 2 HEENT: Positive JVD Respiratory system: Decreased breath sounds at the bases Cardiovascular system: S1 & S2 heard, RRR.  Abd: nondistended, soft and nontender.Normal bowel sounds heard. Central nervous system: Alert and oriented. No focal neurological deficits. Extremities: 1+ edema Skin: No rashes Psychiatry:  Mood & affect appropriate.     Data Reviewed:   CBC: Recent Labs  Lab 11/22/24 1111 11/23/24 0433  WBC 3.7* 4.9  NEUTROABS  --  4.0  HGB 8.5* 7.2*  HCT 27.3* 23.4*  MCV 102.2* 102.2*  PLT 143* 167   Basic Metabolic Panel: Recent Labs  Lab 11/22/24 1111 11/22/24 1311 11/23/24 0433  NA 137  --  136  K 4.3  --  4.4  CL 103  --  101  CO2 24  --  23  GLUCOSE 99  --  132*  BUN 13  --  13  CREATININE 0.87  --  0.89  CALCIUM  9.5  --  9.2  MG  --  1.8 1.7  PHOS  --   --  4.1   GFR: CrCl cannot be calculated (Unknown ideal weight.). Liver Function Tests: Recent Labs  Lab 11/22/24 1111 11/23/24 0433  AST 24 26  ALT <5 <5  ALKPHOS 50 43  BILITOT 0.7 0.7  PROT 7.9 7.4  ALBUMIN  3.4* 3.1*   No results for input(s): LIPASE, AMYLASE in the last 168 hours. No results for input(s): AMMONIA in the last 168 hours. Coagulation Profile: No results for input(s): INR,  PROTIME in the last 168 hours. Cardiac Enzymes: No results for input(s): CKTOTAL, CKMB, CKMBINDEX, TROPONINI in the last 168 hours. BNP (last 3 results) Recent Labs    11/22/24 1311 11/23/24 0433  PROBNP 3,724.0* 3,736.0*   HbA1C: No results for input(s): HGBA1C in the last 72 hours. CBG: No results for input(s): GLUCAP in the last 168 hours. Lipid Profile: No results for input(s): CHOL, HDL, LDLCALC, TRIG, CHOLHDL, LDLDIRECT in the last 72 hours. Thyroid  Function Tests: No results for input(s): TSH, T4TOTAL, FREET4, T3FREE, THYROIDAB in the last 72 hours. Anemia Panel: No results for input(s): VITAMINB12, FOLATE, FERRITIN, TIBC, IRON, RETICCTPCT in the last 72 hours. Urine analysis:    Component Value Date/Time   COLORURINE YELLOW 07/12/2024 1325   APPEARANCEUR HAZY (A) 07/12/2024 1325   LABSPEC 1.012 07/12/2024 1325   PHURINE 6.0 07/12/2024 1325   GLUCOSEU NEGATIVE 07/12/2024 1325   HGBUR NEGATIVE 07/12/2024 1325   BILIRUBINUR NEGATIVE 07/12/2024 1325   KETONESUR NEGATIVE 07/12/2024 1325   PROTEINUR NEGATIVE 07/12/2024 1325   UROBILINOGEN 0.2 11/22/2011 0247   NITRITE POSITIVE (A) 07/12/2024 1325   LEUKOCYTESUR TRACE (A) 07/12/2024 1325   Sepsis Labs: @LABRCNTIP (procalcitonin:4,lacticidven:4)  ) Recent Results (from the past 240 hours)  Body fluid culture w Gram Stain     Status: None (Preliminary result)   Collection Time: 11/22/24  8:32 PM   Specimen: Pleural Fluid  Result Value Ref Range Status   Specimen Description FLUID  Final   Special Requests NONE  Final   Gram Stain   Final    FEW WBC PRESENT, PREDOMINANTLY PMN NO ORGANISMS SEEN    Culture   Final    NO GROWTH < 12 HOURS Performed at [facility], [address], [place], [place] [address]    Report Status PENDING  Incomplete  Gram stain     Status: None   Collection Time: 11/23/24  9:04 AM   Specimen: Pleura  Result Value Ref Range Status    Specimen Description PLEURAL  Final   Special Requests NONE  Final   Gram Stain   Final    WBC PRESENT, PREDOMINANTLY MONONUCLEAR NO ORGANISMS SEEN CYTOSPIN SMEAR Performed at [facility], [address], [place], [place] [address]    Report Status 11/23/2024 FINAL  Final     Radiology Studies: DG Chest 1 View Result Date: 11/23/2024 CLINICAL DATA:  Status post left thoracentesis. EXAM: CHEST  1 VIEW COMPARISON:  11/22/2024 and chest CTA 11/22/2024 FINDINGS: Aeration at the left lung base has improved. Persistent mass-like opacity in the lingular region is similar to the recent chest CT. Probable pleural thickening along the left lung apex. Residual densities at the right lung base compatible with pleural fluid and volume loss. Negative for a pneumothorax. Stable position of the left chest ICD. Heart and mediastinum are stable. Surgical hardware in the thoracolumbar spine. IMPRESSION:  1. Negative for a pneumothorax following left thoracentesis. 2. Improved aeration at the left lung base. 3. Residual densities at the right lung base compatible with pleural fluid and volume loss. 4. Persistent opacity in the lingular region. Electronically Signed   By: Juliene Balder M.D.   On: 11/23/2024 09:50   IR THORACENTESIS ASP PLEURAL SPACE W/IMG GUIDE Result Date: 11/23/2024 INDICATION: 84 year old male admitted for shortness of breath with history of acute on chronic combined systolic and diastolic CHF, with bilateral pleural effusions. Thoracentesis on right side performed yesterday. IR is requested for left-sided diagnostic and therapeutic thoracentesis. EXAM: ULTRASOUND GUIDED LEFT THE DIAGNOSTIC AND THERAPEUTIC THORACENTESIS MEDICATIONS: 5 mL of 1% lidocaine . COMPLICATIONS: None immediate. PROCEDURE: An ultrasound guided thoracentesis was thoroughly discussed with the patient and questions answered. The benefits, risks, alternatives and complications were also discussed. The patient understands and wishes  to proceed with the procedure. Written consent was obtained. Ultrasound was performed to localize and mark an adequate pocket of fluid in the left chest. The area was then prepped and draped in the normal sterile fashion. 1% Lidocaine  was used for local anesthesia. Under ultrasound guidance a 6 Fr Safe-T-Centesis catheter was introduced. Thoracentesis was performed. The catheter was removed and a dressing applied. FINDINGS: A total of approximately 300 mL of hazy, straw-colored pleural fluid was removed. Samples were sent to the laboratory as requested by the clinical team. IMPRESSION: Successful ultrasound guided left thoracentesis yielding 300 ml of pleural fluid. Procedure performed Carlin Griffon, PA-C, under the direct supervision of Dr. Juliene Balder, MD Electronically Signed   By: Juliene Balder M.D.   On: 11/23/2024 09:47   DG Chest Port 1 View Result Date: 11/22/2024 EXAM: 1 VIEW(S) XRAY OF THE CHEST 11/22/2024 08:43:04 PM COMPARISON: 11/22/2024 CLINICAL HISTORY: s/p thoracentesis FINDINGS: LINES, TUBES AND DEVICES: Left chest AICD/pacemaker noted. LUNGS AND PLEURA: Stable interstitial prominence. Stable bibasilar opacities. Stable bilateral pleural effusions. No pneumothorax. HEART AND MEDIASTINUM: Left chest AICD/pacemaker noted. Stable cardiomegaly. Aortic atherosclerosis. BONES AND SOFT TISSUES: Partially visualized spinal fixation hardware in lower thoracic spine. No acute osseous abnormality. IMPRESSION: 1. Stable bilateral pleural effusions. 2. Stable central pulmonary vascular congestion and bibasilar airspace disease. 3. Stable cardiomegaly with aortic atherosclerosis. 4. Left chest AICD/pacemaker in place. Electronically signed by: Greig Pique MD 11/22/2024 08:54 PM EST RP Workstation: [id number]   CT Angio Chest PE W and/or Wo Contrast Result Date: 11/22/2024 EXAM: CTA CHEST 11/22/2024 05:06:29 PM TECHNIQUE: CTA of the chest was performed without and with the administration of 75 mL of iohexol   (OMNIPAQUE ) 350 MG/ML injection. Multiplanar reformatted images are provided for review. MIP images are provided for review. Automated exposure control, iterative reconstruction, and/or weight based adjustment of the mA/kV was utilized to reduce the radiation dose to as low as reasonably achievable. COMPARISON: CT chest 08/29/2024. CLINICAL HISTORY: elevated dimer, chest pain FINDINGS: PULMONARY ARTERIES: Pulmonary arteries are adequately opacified for evaluation. No acute pulmonary embolus. Main pulmonary artery is normal in caliber. MEDIASTINUM: The heart is enlarged, unchanged. Coronary and aortic atherosclerotic calcifications are noted. Aorta is ectatic without focal dilatation. The pericardium demonstrates no acute abnormality. LYMPH NODES: Mildly enlarged low right paratracheal lymph node measures 10 mm, unchanged. No other new and large lymph nodes are seen. LUNGS AND PLEURA: There is a new moderate sized right pleural effusion. There is a new small left pleural effusion which is partially loculated. No pneumothorax. There are secretions in the trachea. Mild emphysema present. There is compressive atelectasis of the bilateral lower lobes.  Masslike density in the lingula measures 3.9 x 4.1 cm and appears unchanged from prior. There is some new patchy airspace disease. Patchy airspace opacities in the left upper lobe and superior segment of the left lower lobe is not significantly changed. UPPER ABDOMEN: There is a 6 mm calculus in the right kidney. Limited images of the upper abdomen are otherwise unremarkable. SOFT TISSUES AND BONES: Left sided pacemaker is present. Lower thoracic spinal fusion hardware is present. No acute bone or soft tissue abnormality. IMPRESSION: 1. No evidence of pulmonary embolism. 2. New moderate right pleural effusion and small partially loculated left pleural effusion, with compressive atelectasis in the bilateral lower lobes. 3. Unchanged masslike density in the lingula. 4. Mildly  enlarged low right paratracheal lymph node, unchanged. 5. Unchanged patchy airspace opacities in the left upper lobe and superior segment of the left lower lobe. Electronically signed by: Greig Pique MD 11/22/2024 05:33 PM EST RP Workstation: [id number]   DG Chest 1 View Result Date: 11/22/2024 EXAM: 1 VIEW(S) XRAY OF THE CHEST 11/22/2024 12:20:00 PM COMPARISON: 10/06/2024 CLINICAL HISTORY: Pain FINDINGS: LINES, TUBES AND DEVICES: Left chest wall cardiac defibrillator in place. Left shoulder rotator cuff anchor suture noted. LUNGS AND PLEURA: Bilateral lower lung zone airspace opacities. Increased interstitial markings. Moderate bilateral pleural effusions. No pneumothorax. HEART AND MEDIASTINUM: Atherosclerotic plaque. No acute abnormality of the cardiac and mediastinal silhouettes. BONES AND SOFT TISSUES: Partially visualized thoracolumbar spinal fixation hardware and lumbar surgical hardware. No acute osseous abnormality. IMPRESSION: 1. Bilateral lower lung zone airspace opacities and increased interstitial markings chronically reflecting edema and atelectasis. Lower lobe infection is not excluded. 2. Moderate bilateral pleural effusions. Electronically signed by: Lonni Necessary MD 11/22/2024 01:09 PM EST RP Workstation: [id number]     Scheduled Meds:  sodium chloride    Intravenous Once   cycloSPORINE   1 drop Both Eyes BID   dorzolamide -timolol   1 drop Both Eyes BID   fluticasone  furoate-vilanterol  1 puff Inhalation q morning   furosemide   40 mg Intravenous BID   isosorbide  mononitrate  30 mg Oral Daily   lidocaine   10 mL     metoprolol  succinate  12.5 mg Oral Daily   Netarsudil  Dimesylate  1 drop Ophthalmic QPM   Continuous Infusions:   LOS: 0 days    Time spent:    Sigurd Pac, MD [facility]   11/23/2024, 11:39 AM    "

## 2024-11-23 NOTE — ED Notes (Signed)
 Pt gone for thoracentesis

## 2024-11-23 NOTE — Progress Notes (Signed)
" ° °  Brief Progress Note   _____________________________________________________________________________________________________________  Patient Name: [name] Patient DOB: 09-Apr-1941 Date: @TODAY @      Data: 84 y.o. male currently awaiting admission to a Progressive bed at [facility]    Action: Reached out to Dr. Fairy regarding the potential downgrade of the patient to a telemetry bed.    Response:  Per Dr. Fairy, ok for patient to be downgraded to Tele. Dr. Fairy to update patients admission order.  _____________________________________________________________________________________________________________  [facility] RN Exped[employer] Niomi Valent S Jahniya Duzan Please contact us  directly via secure chat (search for [facility]) or by calling us  at [phone] [facility]).  "

## 2024-11-23 NOTE — Plan of Care (Signed)

## 2024-11-24 ENCOUNTER — Inpatient Hospital Stay (HOSPITAL_COMMUNITY)

## 2024-11-24 ENCOUNTER — Inpatient Hospital Stay

## 2024-11-24 DIAGNOSIS — I5043 Acute on chronic combined systolic (congestive) and diastolic (congestive) heart failure: Secondary | ICD-10-CM | POA: Diagnosis not present

## 2024-11-24 LAB — CBC
HCT: 25.4 % — ABNORMAL LOW (ref 39.0–52.0)
Hemoglobin: 8.4 g/dL — ABNORMAL LOW (ref 13.0–17.0)
MCH: 31.7 pg (ref 26.0–34.0)
MCHC: 33.1 g/dL (ref 30.0–36.0)
MCV: 95.8 fL (ref 80.0–100.0)
Platelets: 157 K/uL (ref 150–400)
RBC: 2.65 MIL/uL — ABNORMAL LOW (ref 4.22–5.81)
RDW: 18.4 % — ABNORMAL HIGH (ref 11.5–15.5)
WBC: 4.1 K/uL (ref 4.0–10.5)
nRBC: 0 % (ref 0.0–0.2)

## 2024-11-24 LAB — TYPE AND SCREEN
ABO/RH(D): B POS
Antibody Screen: NEGATIVE
Unit division: 0

## 2024-11-24 LAB — TROPONIN T, HIGH SENSITIVITY: Troponin T High Sensitivity: 34 ng/L — ABNORMAL HIGH (ref 0–19)

## 2024-11-24 LAB — BASIC METABOLIC PANEL WITH GFR
Anion gap: 10 (ref 5–15)
BUN: 16 mg/dL (ref 8–23)
CO2: 28 mmol/L (ref 22–32)
Calcium: 9.4 mg/dL (ref 8.9–10.3)
Chloride: 101 mmol/L (ref 98–111)
Creatinine, Ser: 0.97 mg/dL (ref 0.61–1.24)
GFR, Estimated: >60 mL/min
Glucose, Bld: 105 mg/dL — ABNORMAL HIGH (ref 70–99)
Potassium: 3.6 mmol/L (ref 3.5–5.1)
Sodium: 139 mmol/L (ref 135–145)

## 2024-11-24 LAB — BPAM RBC
Blood Product Expiration Date: 202601132359
ISSUE DATE / TIME: 202601021326
Unit Type and Rh: 5100

## 2024-11-24 MED ORDER — LIDOCAINE 5 % EX PTCH
1.0000 | MEDICATED_PATCH | CUTANEOUS | Status: DC
  Administered 2024-11-24 – 2024-11-29 (×6): 1 via TRANSDERMAL
  Filled 2024-11-24 (×5): qty 1

## 2024-11-24 MED ORDER — KETOROLAC TROMETHAMINE 15 MG/ML IJ SOLN
7.5000 mg | Freq: Four times a day (QID) | INTRAMUSCULAR | Status: DC
  Administered 2024-11-24 – 2024-11-25 (×4): 7.5 mg via INTRAVENOUS
  Filled 2024-11-24 (×4): qty 1

## 2024-11-24 NOTE — Plan of Care (Signed)
   Problem: Education: Goal: Knowledge of General Education information will improve Description Including pain rating scale, medication(s)/side effects and non-pharmacologic comfort measures Outcome: Progressing

## 2024-11-24 NOTE — Progress Notes (Addendum)
 " PROGRESS NOTE    [name]  FMW:[id number] DOB: 08-Aug-1941 DOA: 11/22/2024 PCP: [name], MD  84/M with chronic systolic CHF, VT, AICD, chronic anemia, stage III/IV adeno CA left lung recently undergoing chemotherapy presented to the ED with chest pain and worsening shortness of breath.  In the ED he was tachycardic, tachypneic, labs noted creatinine of 0.8, troponin 23, proBNP 3724, D-dimer 2.0, hemoglobin 8.5, CTA chest noted no PE, moderate right and partially loculated left effusion with compressive atelectasis.  Underwent right-sided thoracentesis by EDP, 800 mL fluid drained. - Subsequently had left thoracentesis per IR while in the ER - 1/3 x-ray for chest pain noted small left pneumothorax  Subjective: - Continues to have right-sided chest pain  Assessment and Plan:  Acute on chronic combined systolic/diastolic CHF - Last echo 11/25 noted EF 45-50%, grade 1 DD, normal RV - Suspect his pleural effusions are multifactorial likely related to both malignancy as well as CHF - Continue IV Lasix  1 more day, continue Toprol  - Hold Imdur  and Entresto  with soft BPs - Attempt to wean O2, BMP in a.m. - Increase activity, PT OT eval  Bilateral pleural effusions -sp thoracentesis on both sides 1/2 morning in the ER - Unfortunately pleural fluid protein and LDH were not checked, cultures pending, cytology pending  - Suspect multifactorial etiology-both CHF as well as malignancy could be contributing  Small left PTX - Postthoracentesis yesterday - Appreciate pulmonary input, suspected to be lung entrapment on the left the setting of cancer, less likely malignant BP fistula - Plan to repeat x-ray in a.m.  Persistent chest pain - Secondary to all of the above, has right sided chest wall tenderness at the site of the pain, will add lidocaine  patch, Toradol  X1  Acute on chronic anemia History of iron deficiency anemia -Hemoglobin trending down to 7.2 this morning, check anemia panel  will transfuse 1 unit PRBC  Adeno CA left lung, stage IV -sp XRT 9/24-11/24 - Was recently undergoing chemotherapy, currently on hold in the setting of recent bacteremia - Followed by Dr. Gatha  History of paroxysmal ventricular tachycardia - Monitor electrolytes, continue Toprol   Recent bacteremia, staph lugdunensis - Completed 6 weeks of cefazolin , TEE was negative    DVT prophylaxis: Add Lovenox  to start tomorrow Code Status: Discussed CODE STATUS with the patient, he wishes to have a natural death, changed to DNR Family Communication: Discussed with patient detail, no family at bedside Disposition Plan: To be determined  Procedures: Right and left thoracentesis 1/2   Antimicrobials:    Objective: Vitals:   11/23/24 2307 11/24/24 0420 11/24/24 0500 11/24/24 0931  BP: 126/80 126/78    Pulse: 100 100  98  Resp: 17   20  Temp: 98 F (36.7 C) 98.4 F (36.9 C)    TempSrc:      SpO2: 100% 100%  100%  Weight:   61.4 kg   Height:        Intake/Output Summary (Last 24 hours) at 11/24/2024 1151 Last data filed at 11/23/2024 2050 Gross per 24 hour  Intake 514.33 ml  Output 250 ml  Net 264.33 ml   Filed Weights   11/23/24 1442 11/24/24 0500  Weight: 61.5 kg 61.4 kg    Examination:  General exam: Elderly chronically ill, AAO x 2 HEENT: Positive JVD Respiratory system: Decreased breath sounds at the bases Cardiovascular system: S1 & S2 heard, RRR.  Abd: nondistended, soft and nontender.Normal bowel sounds heard. Central nervous system: Alert and oriented. No  focal neurological deficits. Extremities: trace edema Skin: No rashes Psychiatry:  Mood & affect appropriate.     Data Reviewed:   CBC: Recent Labs  Lab 11/22/24 1111 11/23/24 0433 11/24/24 0823  WBC 3.7* 4.9 4.1  NEUTROABS  --  4.0  --   HGB 8.5* 7.2* 8.4*  HCT 27.3* 23.4* 25.4*  MCV 102.2* 102.2* 95.8  PLT 143* 167 157   Basic Metabolic Panel: Recent Labs  Lab 11/22/24 1111 11/22/24 1311  11/23/24 0433 11/24/24 0823  NA 137  --  136 139  K 4.3  --  4.4 3.6  CL 103  --  101 101  CO2 24  --  23 28  GLUCOSE 99  --  132* 105*  BUN 13  --  13 16  CREATININE 0.87  --  0.89 0.97  CALCIUM  9.5  --  9.2 9.4  MG  --  1.8 1.7  --   PHOS  --   --  4.1  --    GFR: Estimated Creatinine Clearance: 48.4 mL/min (by C-G formula based on SCr of 0.97 mg/dL). Liver Function Tests: Recent Labs  Lab 11/22/24 1111 11/23/24 0433  AST 24 26  ALT <5 <5  ALKPHOS 50 43  BILITOT 0.7 0.7  PROT 7.9 7.4  ALBUMIN  3.4* 3.1*   No results for input(s): LIPASE, AMYLASE in the last 168 hours. No results for input(s): AMMONIA in the last 168 hours. Coagulation Profile: Recent Labs  Lab 11/23/24 2022  INR 1.3*   Cardiac Enzymes: No results for input(s): CKTOTAL, CKMB, CKMBINDEX, TROPONINI in the last 168 hours. BNP (last 3 results) Recent Labs    11/22/24 1311 11/23/24 0433  PROBNP 3,724.0* 3,736.0*   HbA1C: No results for input(s): HGBA1C in the last 72 hours. CBG: No results for input(s): GLUCAP in the last 168 hours. Lipid Profile: No results for input(s): CHOL, HDL, LDLCALC, TRIG, CHOLHDL, LDLDIRECT in the last 72 hours. Thyroid  Function Tests: No results for input(s): TSH, T4TOTAL, FREET4, T3FREE, THYROIDAB in the last 72 hours. Anemia Panel: Recent Labs    11/23/24 2022  VITAMINB12 1,518*  FOLATE 10.5   Urine analysis:    Component Value Date/Time   COLORURINE YELLOW 07/12/2024 1325   APPEARANCEUR HAZY (A) 07/12/2024 1325   LABSPEC 1.012 07/12/2024 1325   PHURINE 6.0 07/12/2024 1325   GLUCOSEU NEGATIVE 07/12/2024 1325   HGBUR NEGATIVE 07/12/2024 1325   BILIRUBINUR NEGATIVE 07/12/2024 1325   KETONESUR NEGATIVE 07/12/2024 1325   PROTEINUR NEGATIVE 07/12/2024 1325   UROBILINOGEN 0.2 11/22/2011 0247   NITRITE POSITIVE (A) 07/12/2024 1325   LEUKOCYTESUR TRACE (A) 07/12/2024 1325   Sepsis  Labs: @LABRCNTIP (procalcitonin:4,lacticidven:4)  ) Recent Results (from the past 240 hours)  Body fluid culture w Gram Stain     Status: None (Preliminary result)   Collection Time: 11/22/24  8:32 PM   Specimen: Pleural Fluid  Result Value Ref Range Status   Specimen Description FLUID  Final   Special Requests NONE  Final   Gram Stain   Final    FEW WBC PRESENT, PREDOMINANTLY PMN NO ORGANISMS SEEN    Culture   Final    NO GROWTH < 12 HOURS Performed at [facility], [address] [place], [place] [address]    Report Status PENDING  Incomplete  Gram stain     Status: None   Collection Time: 11/23/24  9:04 AM   Specimen: Pleura  Result Value Ref Range Status   Specimen Description PLEURAL  Final   Special Requests NONE  Final   Gram Stain   Final    WBC PRESENT, PREDOMINANTLY MONONUCLEAR NO ORGANISMS SEEN CYTOSPIN SMEAR Performed at [facility], [address] [place], [place] [address]    Report Status 11/23/2024 FINAL  Final     Radiology Studies: DG CHEST PORT 1 VIEW Result Date: 11/24/2024 EXAM: 1 VIEW(S) XRAY OF THE CHEST 11/24/2024 08:03:48 AM COMPARISON: 11/23/2024 CLINICAL HISTORY: Chest pain FINDINGS: LINES, TUBES AND DEVICES: Left chest AICD. LUNGS AND PLEURA: Small right pleural effusion. Small pneumothorax at left lung base. Increased basilar airspace opacities on the right. Similar masslike airspace opacities in the left lung base. Unchanged diffuse interstitial opacities. HEART AND MEDIASTINUM: Aortic atherosclerosis. No acute abnormality of the cardiac and mediastinal silhouettes. No evidence of midline shift. BONES AND SOFT TISSUES: Partially visualized lumbar spinal fusion hardware in place. No acute osseous abnormality. IMPRESSION: 1. Small pneumothorax at the left lung base. 2. Increased small right pleural effusion with increased basilar opacities which may be due to atelectasis or pneumonia. 3. Similar masslike left basilar airspace opacities.  Results will be called by the radiology assistant and documented within [facility]. Electronically signed by: Norman Gatlin MD 11/24/2024 08:11 AM EST RP Workstation: [id number]   DG Chest 1 View Result Date: 11/23/2024 CLINICAL DATA:  Status post left thoracentesis. EXAM: CHEST  1 VIEW COMPARISON:  11/22/2024 and chest CTA 11/22/2024 FINDINGS: Aeration at the left lung base has improved. Persistent mass-like opacity in the lingular region is similar to the recent chest CT. Probable pleural thickening along the left lung apex. Residual densities at the right lung base compatible with pleural fluid and volume loss. Negative for a pneumothorax. Stable position of the left chest ICD. Heart and mediastinum are stable. Surgical hardware in the thoracolumbar spine. IMPRESSION: 1. Negative for a pneumothorax following left thoracentesis. 2. Improved aeration at the left lung base. 3. Residual densities at the right lung base compatible with pleural fluid and volume loss. 4. Persistent opacity in the lingular region. Electronically Signed   By: Juliene Balder M.D.   On: 11/23/2024 09:50   IR THORACENTESIS ASP PLEURAL SPACE W/IMG GUIDE Result Date: 11/23/2024 INDICATION: 84 year old male admitted for shortness of breath with history of acute on chronic combined systolic and diastolic CHF, with bilateral pleural effusions. Thoracentesis on right side performed yesterday. IR is requested for left-sided diagnostic and therapeutic thoracentesis. EXAM: ULTRASOUND GUIDED LEFT THE DIAGNOSTIC AND THERAPEUTIC THORACENTESIS MEDICATIONS: 5 mL of 1% lidocaine . COMPLICATIONS: None immediate. PROCEDURE: An ultrasound guided thoracentesis was thoroughly discussed with the patient and questions answered. The benefits, risks, alternatives and complications were also discussed. The patient understands and wishes to proceed with the procedure. Written consent was obtained. Ultrasound was performed to localize and mark an adequate pocket of fluid  in the left chest. The area was then prepped and draped in the normal sterile fashion. 1% Lidocaine  was used for local anesthesia. Under ultrasound guidance a 6 Fr Safe-T-Centesis catheter was introduced. Thoracentesis was performed. The catheter was removed and a dressing applied. FINDINGS: A total of approximately 300 mL of hazy, straw-colored pleural fluid was removed. Samples were sent to the laboratory as requested by the clinical team. IMPRESSION: Successful ultrasound guided left thoracentesis yielding 300 ml of pleural fluid. Procedure performed Carlin Griffon, PA-C, under the direct supervision of Dr. Juliene Balder, MD Electronically Signed   By: Juliene Balder M.D.   On: 11/23/2024 09:47   DG Chest Port 1 View Result Date:  11/22/2024 EXAM: 1 VIEW(S) XRAY OF THE CHEST 11/22/2024 08:43:04 PM COMPARISON: 11/22/2024 CLINICAL HISTORY: s/p thoracentesis FINDINGS: LINES, TUBES AND DEVICES: Left chest AICD/pacemaker noted. LUNGS AND PLEURA: Stable interstitial prominence. Stable bibasilar opacities. Stable bilateral pleural effusions. No pneumothorax. HEART AND MEDIASTINUM: Left chest AICD/pacemaker noted. Stable cardiomegaly. Aortic atherosclerosis. BONES AND SOFT TISSUES: Partially visualized spinal fixation hardware in lower thoracic spine. No acute osseous abnormality. IMPRESSION: 1. Stable bilateral pleural effusions. 2. Stable central pulmonary vascular congestion and bibasilar airspace disease. 3. Stable cardiomegaly with aortic atherosclerosis. 4. Left chest AICD/pacemaker in place. Electronically signed by: Greig Pique MD 11/22/2024 08:54 PM EST RP Workstation: [id number]   CT Angio Chest PE W and/or Wo Contrast Result Date: 11/22/2024 EXAM: CTA CHEST 11/22/2024 05:06:29 PM TECHNIQUE: CTA of the chest was performed without and with the administration of 75 mL of iohexol  (OMNIPAQUE ) 350 MG/ML injection. Multiplanar reformatted images are provided for review. MIP images are provided for review. Automated  exposure control, iterative reconstruction, and/or weight based adjustment of the mA/kV was utilized to reduce the radiation dose to as low as reasonably achievable. COMPARISON: CT chest 08/29/2024. CLINICAL HISTORY: elevated dimer, chest pain FINDINGS: PULMONARY ARTERIES: Pulmonary arteries are adequately opacified for evaluation. No acute pulmonary embolus. Main pulmonary artery is normal in caliber. MEDIASTINUM: The heart is enlarged, unchanged. Coronary and aortic atherosclerotic calcifications are noted. Aorta is ectatic without focal dilatation. The pericardium demonstrates no acute abnormality. LYMPH NODES: Mildly enlarged low right paratracheal lymph node measures 10 mm, unchanged. No other new and large lymph nodes are seen. LUNGS AND PLEURA: There is a new moderate sized right pleural effusion. There is a new small left pleural effusion which is partially loculated. No pneumothorax. There are secretions in the trachea. Mild emphysema present. There is compressive atelectasis of the bilateral lower lobes. Masslike density in the lingula measures 3.9 x 4.1 cm and appears unchanged from prior. There is some new patchy airspace disease. Patchy airspace opacities in the left upper lobe and superior segment of the left lower lobe is not significantly changed. UPPER ABDOMEN: There is a 6 mm calculus in the right kidney. Limited images of the upper abdomen are otherwise unremarkable. SOFT TISSUES AND BONES: Left sided pacemaker is present. Lower thoracic spinal fusion hardware is present. No acute bone or soft tissue abnormality. IMPRESSION: 1. No evidence of pulmonary embolism. 2. New moderate right pleural effusion and small partially loculated left pleural effusion, with compressive atelectasis in the bilateral lower lobes. 3. Unchanged masslike density in the lingula. 4. Mildly enlarged low right paratracheal lymph node, unchanged. 5. Unchanged patchy airspace opacities in the left upper lobe and superior  segment of the left lower lobe. Electronically signed by: Greig Pique MD 11/22/2024 05:33 PM EST RP Workstation: [id number]   DG Chest 1 View Result Date: 11/22/2024 EXAM: 1 VIEW(S) XRAY OF THE CHEST 11/22/2024 12:20:00 PM COMPARISON: 10/06/2024 CLINICAL HISTORY: Pain FINDINGS: LINES, TUBES AND DEVICES: Left chest wall cardiac defibrillator in place. Left shoulder rotator cuff anchor suture noted. LUNGS AND PLEURA: Bilateral lower lung zone airspace opacities. Increased interstitial markings. Moderate bilateral pleural effusions. No pneumothorax. HEART AND MEDIASTINUM: Atherosclerotic plaque. No acute abnormality of the cardiac and mediastinal silhouettes. BONES AND SOFT TISSUES: Partially visualized thoracolumbar spinal fixation hardware and lumbar surgical hardware. No acute osseous abnormality. IMPRESSION: 1. Bilateral lower lung zone airspace opacities and increased interstitial markings chronically reflecting edema and atelectasis. Lower lobe infection is not excluded. 2. Moderate bilateral pleural effusions. Electronically signed by: Lonni Necessary MD  11/22/2024 01:09 PM EST RP Workstation: [id number]     Scheduled Meds:  cycloSPORINE   1 drop Both Eyes BID   dorzolamide -timolol   1 drop Both Eyes BID   enoxaparin  (LOVENOX ) injection  40 mg Subcutaneous Q24H   fluticasone  furoate-vilanterol  1 puff Inhalation q morning   furosemide   40 mg Intravenous BID   isosorbide  mononitrate  30 mg Oral Daily   ketorolac   7.5 mg Intravenous Q6H   lidocaine   1 patch Transdermal Q24H   lidocaine   10 mL     metoprolol  succinate  12.5 mg Oral Daily   Continuous Infusions:   LOS: 1 day    Time spent:    Sigurd Pac, MD [facility]   11/24/2024, 11:51 AM    "

## 2024-11-24 NOTE — Progress Notes (Signed)
 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 Repeat CXR PRN today if more SOB or pain otherwise check in AM Would not rush to place drain on left unless we are forced to  Rolan Sharps MD PCCM

## 2024-11-25 ENCOUNTER — Inpatient Hospital Stay (HOSPITAL_COMMUNITY)

## 2024-11-25 DIAGNOSIS — I5043 Acute on chronic combined systolic (congestive) and diastolic (congestive) heart failure: Secondary | ICD-10-CM | POA: Diagnosis not present

## 2024-11-25 LAB — BASIC METABOLIC PANEL WITH GFR
Anion gap: 10 (ref 5–15)
BUN: 23 mg/dL (ref 8–23)
CO2: 28 mmol/L (ref 22–32)
Calcium: 8.9 mg/dL (ref 8.9–10.3)
Chloride: 101 mmol/L (ref 98–111)
Creatinine, Ser: 1.23 mg/dL (ref 0.61–1.24)
GFR, Estimated: 58 mL/min — ABNORMAL LOW
Glucose, Bld: 113 mg/dL — ABNORMAL HIGH (ref 70–99)
Potassium: 3.7 mmol/L (ref 3.5–5.1)
Sodium: 139 mmol/L (ref 135–145)

## 2024-11-25 LAB — LD, BODY FLUID (OTHER)
LD, Body Fluid: 167 IU/L
LD, Body Fluid: 238 IU/L

## 2024-11-25 LAB — PROTEIN, BODY FLUID (OTHER): Total Protein, Body Fluid Other: 4.7 g/dL

## 2024-11-25 LAB — CBC
HCT: 26.3 % — ABNORMAL LOW (ref 39.0–52.0)
Hemoglobin: 8.5 g/dL — ABNORMAL LOW (ref 13.0–17.0)
MCH: 31.6 pg (ref 26.0–34.0)
MCHC: 32.3 g/dL (ref 30.0–36.0)
MCV: 97.8 fL (ref 80.0–100.0)
Platelets: 143 K/uL — ABNORMAL LOW (ref 150–400)
RBC: 2.69 MIL/uL — ABNORMAL LOW (ref 4.22–5.81)
RDW: 17.7 % — ABNORMAL HIGH (ref 11.5–15.5)
WBC: 3.6 K/uL — ABNORMAL LOW (ref 4.0–10.5)
nRBC: 0 % (ref 0.0–0.2)

## 2024-11-25 LAB — ALBUMIN, FLUID (OTHER): Albumin, Body Fluid Other: 2.3 g/dL

## 2024-11-25 MED ORDER — SENNOSIDES-DOCUSATE SODIUM 8.6-50 MG PO TABS
1.0000 | ORAL_TABLET | Freq: Two times a day (BID) | ORAL | Status: DC
  Administered 2024-11-25 – 2024-11-29 (×8): 1 via ORAL
  Filled 2024-11-25 (×8): qty 1

## 2024-11-25 MED ORDER — POLYETHYLENE GLYCOL 3350 17 G PO PACK
17.0000 g | PACK | Freq: Every day | ORAL | Status: DC
  Administered 2024-11-26 – 2024-11-29 (×4): 17 g via ORAL
  Filled 2024-11-25 (×4): qty 1

## 2024-11-25 MED ORDER — KETOROLAC TROMETHAMINE 15 MG/ML IJ SOLN: 7.5000 mg | Freq: Once | INTRAMUSCULAR | Status: DC

## 2024-11-25 NOTE — Plan of Care (Signed)

## 2024-11-25 NOTE — Progress Notes (Signed)
 " PROGRESS NOTE    [name]  FMW:[id number] DOB: [birth year] DOA: 11/22/2024 PCP: [name], MD  [name] with chronic systolic CHF, VT, AICD, chronic anemia, stage III/IV adeno CA left lung recently undergoing chemotherapy presented to the ED with chest pain and worsening shortness of breath.  In the ED he was tachycardic, tachypneic, labs noted creatinine of 0.8, troponin 23, proBNP 3724, D-dimer 2.0, hemoglobin 8.5, CTA chest noted no PE, moderate right and partially loculated left effusion with compressive atelectasis.  Underwent right-sided thoracentesis by EDP, 800 mL fluid drained. - Subsequently had left thoracentesis per IR while in the ER - 1/3 x-ray for chest pain noted small left pneumothorax  Subjective: - Continues to have right-sided chest pain, but improved on toradol   Assessment and Plan:  Acute on chronic combined systolic/diastolic CHF - Last echo 11/25 noted EF 45-50%, grade 1 DD, normal RV - Suspect his pleural effusions are multifactorial likely related to both malignancy as well as CHF - BP softer, hold IV Lasix  today, continue Toprol , Entresto  on hold as well - Increase activity, PT OT eval  Bilateral pleural effusions -sp thoracentesis on both sides 1/2 morning in the ER - Unfortunately pleural fluid protein and LDH were not checked, cultures negative so far, cytology still pending - Suspect multifactorial etiology-both CHF as well as malignancy could be contributing  Small left PTX - Postthoracentesis 1/2 - Appreciate pulmonary input, suspected to be lung entrapment on the left the setting of cancer, 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 malignant BP fistula - Repeat x-ray with small basilar PTX, largely asymptomatic will discuss with pulmonary, repeat x-ray tomorrow  Persistent chest pain - Secondary to all of the above, has right sided chest wall tenderness at the site of the pain, will add lidocaine  patch, Toradol  X1 this afternoon  Acute on chronic anemia History of iron  deficiency anemia - Hemoglobin dropped to 7.2 this admission, transfuse 1 unit of PRBC, now improved and stable  Adeno CA left lung, stage IV -sp XRT 9/24-11/24 - Was recently undergoing chemotherapy, currently on hold in the setting of recent bacteremia - Followed by Dr. Gatha  History of paroxysmal ventricular tachycardia - Monitor electrolytes, continue Toprol   Recent bacteremia, staph lugdunensis - Completed 6 weeks of cefazolin , TEE was negative    DVT prophylaxis: Add Lovenox  Code Status: Discussed CODE STATUS with the patient, he wishes to have a natural death, changed to DNR Family Communication: Discussed with patient detail, no family at bedside Disposition Plan: To be determined  Procedures: Right and left thoracentesis 1/2   Antimicrobials:    Objective: Vitals:   11/25/24 0433 11/25/24 0500 11/25/24 0827 11/25/24 0932  BP: 100/65   90/62  Pulse: 100   78  Resp:    18  Temp: 98.9 F (37.2 C)   98.6 F (37 C)  TempSrc: Oral   Oral  SpO2: 100%  99% 99%  Weight:  61.4 kg    Height:        Intake/Output Summary (Last 24 hours) at 11/25/2024 1113 Last data filed at 11/25/2024 1023 Gross per 24 hour  Intake 240 ml  Output 1250 ml  Net -1010 ml   Filed Weights   11/23/24 1442 11/24/24 0500 11/25/24 0500  Weight: 61.5 kg 61.4 kg 61.4 kg    Examination:  General exam: Elderly chronically ill, AAO x 2, no distress HEENT: no JVD Respiratory system: Decreased breath sounds at the bases Cardiovascular system: S1 & S2 heard, RRR.  Abd: nondistended, soft and nontender.Normal  bowel sounds heard. Central nervous system: Alert and oriented. No focal neurological deficits. Extremities: trace edema Skin: No rashes Psychiatry:  Mood & affect appropriate.     Data Reviewed:   CBC: Recent Labs  Lab 11/22/24 1111 11/23/24 0433 11/24/24 0823 11/25/24 0722  WBC 3.7* 4.9 4.1 3.6*  NEUTROABS  --  4.0  --   --   HGB 8.5* 7.2* 8.4* 8.5*  HCT 27.3* 23.4*  25.4* 26.3*  MCV 102.2* 102.2* 95.8 97.8  PLT 143* 167 157 143*   Basic Metabolic Panel: Recent Labs  Lab 11/22/24 1111 11/22/24 1311 11/23/24 0433 11/24/24 0823 11/25/24 0722  NA 137  --  136 139 139  K 4.3  --  4.4 3.6 3.7  CL 103  --  101 101 101  CO2 24  --  23 28 28   GLUCOSE 99  --  132* 105* 113*  BUN 13  --  13 16 23   CREATININE 0.87  --  0.89 0.97 1.23  CALCIUM  9.5  --  9.2 9.4 8.9  MG  --  1.8 1.7  --   --   PHOS  --   --  4.1  --   --    GFR: Estimated Creatinine Clearance: 38.1 mL/min (by C-G formula based on SCr of 1.23 mg/dL). Liver Function Tests: Recent Labs  Lab 11/22/24 1111 11/23/24 0433  AST 24 26  ALT <5 <5  ALKPHOS 50 43  BILITOT 0.7 0.7  PROT 7.9 7.4  ALBUMIN  3.4* 3.1*   No results for input(s): LIPASE, AMYLASE in the last 168 hours. No results for input(s): AMMONIA in the last 168 hours. Coagulation Profile: Recent Labs  Lab 11/23/24 2022  INR 1.3*   Cardiac Enzymes: No results for input(s): CKTOTAL, CKMB, CKMBINDEX, TROPONINI in the last 168 hours. BNP (last 3 results) Recent Labs    11/22/24 1311 11/23/24 0433  PROBNP 3,724.0* 3,736.0*   HbA1C: No results for input(s): HGBA1C in the last 72 hours. CBG: No results for input(s): GLUCAP in the last 168 hours. Lipid Profile: No results for input(s): CHOL, HDL, LDLCALC, TRIG, CHOLHDL, LDLDIRECT in the last 72 hours. Thyroid  Function Tests: No results for input(s): TSH, T4TOTAL, FREET4, T3FREE, THYROIDAB in the last 72 hours. Anemia Panel: Recent Labs    11/23/24 2022  VITAMINB12 1,518*  FOLATE 10.5   Urine analysis:    Component Value Date/Time   COLORURINE YELLOW 07/12/2024 1325   APPEARANCEUR HAZY (A) 07/12/2024 1325   LABSPEC 1.012 07/12/2024 1325   PHURINE 6.0 07/12/2024 1325   GLUCOSEU NEGATIVE 07/12/2024 1325   HGBUR NEGATIVE 07/12/2024 1325   BILIRUBINUR NEGATIVE 07/12/2024 1325   KETONESUR NEGATIVE 07/12/2024 1325    PROTEINUR NEGATIVE 07/12/2024 1325   UROBILINOGEN 0.2 11/22/2011 0247   NITRITE POSITIVE (A) 07/12/2024 1325   LEUKOCYTESUR TRACE (A) 07/12/2024 1325   Sepsis Labs: @LABRCNTIP (procalcitonin:4,lacticidven:4)  ) Recent Results (from the past 240 hours)  Body fluid culture w Gram Stain     Status: None (Preliminary result)   Collection Time: 11/22/24  8:32 PM   Specimen: Pleural Fluid  Result Value Ref Range Status   Specimen Description FLUID  Final   Special Requests NONE  Final   Gram Stain   Final    FEW WBC PRESENT, PREDOMINANTLY PMN NO ORGANISMS SEEN    Culture   Final    NO GROWTH 2 DAYS Performed at [facility], [address] [place], [place] [address]    Report Status PENDING  Incomplete  Gram stain     Status: None   Collection Time: 11/23/24  9:04 AM   Specimen: Pleura  Result Value Ref Range Status   Specimen Description PLEURAL  Final   Special Requests NONE  Final   Gram Stain   Final    WBC PRESENT, PREDOMINANTLY MONONUCLEAR NO ORGANISMS SEEN CYTOSPIN SMEAR Performed at [facility], [address], [place], [place] [address]    Report Status 11/23/2024 FINAL  Final     Radiology Studies: DG CHEST PORT 1 VIEW Result Date: 11/25/2024 EXAM: 1 VIEW(S) XRAY OF THE CHEST 11/25/2024 07:17:00 AM COMPARISON: 11/24/2024 CLINICAL HISTORY: Pneumothorax FINDINGS: LINES, TUBES AND DEVICES: Left chest AICD in place. LUNGS AND PLEURA: Stable small right pleural effusion. Persistent small left basilar pneumothorax. Decreased interstitial opacities. Persistent masslike airspace opacities in left lung base. HEART AND MEDIASTINUM: No acute abnormality of the cardiac and mediastinal silhouettes. BONES AND SOFT TISSUES: Thoracolumbar surgical hardware noted. IMPRESSION: 1. Persistent small left basilar pneumothorax. This measures approximately 1.7 cm in thickness over the left costophrenic angle versus 1.4 cm previously. 2. Persistent masslike airspace opacities in the  left lung base. 3. Stable small right pleural effusion. Electronically signed by: Waddell Calk MD 11/25/2024 08:55 AM EST RP Workstation: [id number]   DG CHEST PORT 1 VIEW Result Date: 11/24/2024 EXAM: 1 VIEW(S) XRAY OF THE CHEST 11/24/2024 08:03:48 AM COMPARISON: 11/23/2024 CLINICAL HISTORY: Chest pain FINDINGS: LINES, TUBES AND DEVICES: Left chest AICD. LUNGS AND PLEURA: Small right pleural effusion. Small pneumothorax at left lung base. Increased basilar airspace opacities on the right. Similar masslike airspace opacities in the left lung base. Unchanged diffuse interstitial opacities. HEART AND MEDIASTINUM: Aortic atherosclerosis. No acute abnormality of the cardiac and mediastinal silhouettes. No evidence of midline shift. BONES AND SOFT TISSUES: Partially visualized lumbar spinal fusion hardware in place. No acute osseous abnormality. IMPRESSION: 1. Small pneumothorax at the left lung base. 2. Increased small right pleural effusion with increased basilar opacities which may be due to atelectasis or pneumonia. 3. Similar masslike left basilar airspace opacities. Results will be called by the radiology assistant and documented within [facility]. Electronically signed by: Norman Gatlin MD 11/24/2024 08:11 AM EST RP Workstation: [id number]     Scheduled Meds:  cycloSPORINE   1 drop Both Eyes BID   dorzolamide -timolol   1 drop Both Eyes BID   enoxaparin  (LOVENOX ) injection  40 mg Subcutaneous Q24H   fluticasone  furoate-vilanterol  1 puff Inhalation q morning   furosemide   40 mg Intravenous BID   isosorbide  mononitrate  30 mg Oral Daily   ketorolac   7.5 mg Intravenous Q6H   lidocaine   1 patch Transdermal Q24H   lidocaine   10 mL     metoprolol  succinate  12.5 mg Oral Daily   Continuous Infusions:   LOS: 2 days    Time spent:    Sigurd Pac, MD [facility]   11/25/2024, 11:13 AM    "

## 2024-11-26 ENCOUNTER — Inpatient Hospital Stay (HOSPITAL_COMMUNITY)

## 2024-11-26 DIAGNOSIS — I5043 Acute on chronic combined systolic (congestive) and diastolic (congestive) heart failure: Secondary | ICD-10-CM | POA: Diagnosis not present

## 2024-11-26 LAB — CBC
HCT: 26 % — ABNORMAL LOW (ref 39.0–52.0)
Hemoglobin: 8.4 g/dL — ABNORMAL LOW (ref 13.0–17.0)
MCH: 32.1 pg (ref 26.0–34.0)
MCHC: 32.3 g/dL (ref 30.0–36.0)
MCV: 99.2 fL (ref 80.0–100.0)
Platelets: 160 K/uL (ref 150–400)
RBC: 2.62 MIL/uL — ABNORMAL LOW (ref 4.22–5.81)
RDW: 17.2 % — ABNORMAL HIGH (ref 11.5–15.5)
WBC: 6.7 K/uL (ref 4.0–10.5)
nRBC: 0 % (ref 0.0–0.2)

## 2024-11-26 LAB — BASIC METABOLIC PANEL WITH GFR
Anion gap: 9 (ref 5–15)
BUN: 29 mg/dL — ABNORMAL HIGH (ref 8–23)
CO2: 28 mmol/L (ref 22–32)
Calcium: 9.2 mg/dL (ref 8.9–10.3)
Chloride: 100 mmol/L (ref 98–111)
Creatinine, Ser: 1.23 mg/dL (ref 0.61–1.24)
GFR, Estimated: 58 mL/min — ABNORMAL LOW
Glucose, Bld: 130 mg/dL — ABNORMAL HIGH (ref 70–99)
Potassium: 3.9 mmol/L (ref 3.5–5.1)
Sodium: 137 mmol/L (ref 135–145)

## 2024-11-26 LAB — BODY FLUID CULTURE W GRAM STAIN: Culture: NO GROWTH

## 2024-11-26 MED ORDER — PROMETHAZINE HCL 25 MG/ML IJ SOLN
12.5000 mg | Freq: Once | INTRAMUSCULAR | Status: DC
  Filled 2024-11-26: qty 1

## 2024-11-26 MED ORDER — ONDANSETRON 4 MG PO TBDP: 8.0000 mg | ORAL_TABLET | Freq: Once | ORAL | Status: DC

## 2024-11-26 MED ORDER — BISACODYL 10 MG RE SUPP
10.0000 mg | Freq: Once | RECTAL | Status: AC
  Administered 2024-11-26: 10 mg via RECTAL
  Filled 2024-11-26: qty 1

## 2024-11-26 MED ORDER — TORSEMIDE 20 MG PO TABS
20.0000 mg | ORAL_TABLET | Freq: Every day | ORAL | Status: DC
  Administered 2024-11-26 – 2024-11-29 (×4): 20 mg via ORAL
  Filled 2024-11-26 (×4): qty 1

## 2024-11-26 MED ORDER — FLEET ENEMA RE ENEM
1.0000 | ENEMA | Freq: Once | RECTAL | Status: AC
  Administered 2024-11-26: 1 via RECTAL
  Filled 2024-11-26: qty 1

## 2024-11-26 MED ORDER — TRIMETHOBENZAMIDE HCL 100 MG/ML IM SOLN: 200.0000 mg | Freq: Four times a day (QID) | INTRAMUSCULAR | Status: DC | PRN

## 2024-11-26 MED ORDER — SODIUM CHLORIDE 0.9 % IV SOLN
12.5000 mg | Freq: Once | INTRAVENOUS | Status: DC
  Filled 2024-11-26: qty 0.5

## 2024-11-26 NOTE — Progress Notes (Signed)
 Mobility Specialist Progress Note:    11/26/24 1005  Mobility  Activity Ambulated with assistance (In hallway)  Level of Assistance Contact guard assist, steadying assist  Assistive Device Front wheel walker  Distance Ambulated (ft) 60 ft  Activity Response Tolerated well  Mobility Referral Yes  Mobility visit 1 Mobility  Mobility Specialist Start Time (ACUTE ONLY) [id number]  Mobility Specialist Stop Time (ACUTE ONLY) 1005  Mobility Specialist Time Calculation (min) (ACUTE ONLY) 17 min   Received pt in bed and agreeable to mobility. Pt found on 2 L/min O2. Pt required MinG for safety. Pt c/o SOB; unreliable pleth, increased pt's O2 to 3 L/min. Returned to room without fault. Left pt in chair w/ alarm on and on 2 L/min. Personal belongings and call light within reach. All needs met.  [name] Mobility Specialist  Please contact via [employer] or  Rehab Office [phone]

## 2024-11-26 NOTE — Progress Notes (Signed)
 11/26/2024 Case reviewed and serial imaging. No improvement nor worsening to my eye of incomplete left lung expansion post thora in area ipsilateral to known cancer. 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

## 2024-11-26 NOTE — Progress Notes (Signed)
 Heart Failure Navigator Progress Note  Assessed for Heart & Vascular TOC clinic readiness.  Patient does not meet criteria due to multiple morbilities. No HF TOC per Dr. Fairy. Plans for a Palliative Consult . SABRA   Navigator will sign off at this time.   Stephane Haddock, BSN, Scientist, Clinical (histocompatibility And Immunogenetics) Only

## 2024-11-26 NOTE — Care Management Important Message (Signed)
 Important Message  Patient Details  Name: [name] MRN: [id number] Date of Birth: 1941/11/16   Important Message Given:  Yes - Medicare IM     Jennie Laneta Dragon 11/26/2024, 3:17 PM

## 2024-11-26 NOTE — Progress Notes (Signed)
 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  In the ED he was tachycardic, tachypneic, labs noted creatinine of 0.8, troponin 23, proBNP 3724, D-dimer 2.0, hemoglobin 8.5, CTA chest noted no PE, moderate right and partially loculated left effusion with compressive atelectasis.  Underwent right-sided thoracentesis by EDP, 800 mL fluid drained. - Subsequently had left thoracentesis per IR while in the ER - 1/3 x-ray for chest pain noted small left pneumothorax -1/5: d/w Pulm again, conservative mgt, poor candidate for invasive mgt w/ Chest tube etc  Subjective: - Chest pain is a little better, feels weak and more short of breath today  Assessment and Plan:  Acute on chronic combined systolic/diastolic CHF - Last echo 11/25 noted EF 45-50%, grade 1 DD, normal RV - Suspect his pleural effusions are multifactorial likely related to both malignancy as well as CHF - BP soft, switch to oral torsemide  today, continue low-dose Toprol  -- Increase activity, PT OT eval  Bilateral pleural effusions -sp thoracentesis on both sides 1/2 morning in the ER - Unfortunately pleural fluid protein and LDH were not checked, cultures negative so far, cytology still pending - Suspect multifactorial etiology-both CHF as well as malignancy could be contributing  Left PTX - Postthoracentesis 1/2 - Appreciate pulmonary input, suspected to be lung entrapment on the left the setting of cancer, less likely malignant BP fistula - Repeat x-ray with small basilar PTX, largely asymptomatic will discuss with pulmonary today, felt to be a poor candidate for invasive management, chest tube etc., recommended conservative management for now  Adeno CA left lung, stage IV -sp XRT  9/24-11/24 - Was recently undergoing chemotherapy, currently on hold in the setting of recent bacteremia - Followed by Dr. Gatha  Ethics: Chronically ill and frail with CHF, advanced age lung cancer, I worry he is at risk of ongoing decline, will request palliative care eval this admission  Persistent chest pain - Secondary to all of the above, CTA negative for PE, no ACS,  has right sided chest wall tenderness at the site of the pain, given Toradol  yesterday, continue lidocaine  patch  Acute on chronic anemia History of iron deficiency anemia - Hemoglobin dropped to 7.2 this admission, transfused 1 unit of PRBC, now improved and stable  History of paroxysmal ventricular tachycardia - Monitor electrolytes, continue Toprol   Recent bacteremia, staph lugdunensis - Completed 6 weeks of cefazolin , TEE was negative    DVT prophylaxis:  Lovenox  Code Status: DNR Family Communication: Discussed with patient detail, no family at bedside Disposition Plan: To be determined  Procedures: Right and left thoracentesis 1/2   Antimicrobials:    Objective: Vitals:   11/25/24 1946 11/26/24 0439 11/26/24 0931 11/26/24 0952  BP: 134/83 (!) 105/56 (!) 105/56 121/68  Pulse: 100 (!) 101 99 100  Resp: 18   16  Temp: 97.9 F (36.6 C) 98.7 F (37.1 C)  99.1 F (37.3 C)  TempSrc:    Oral  SpO2: 99% 100%  100%  Weight:      Height:        Intake/Output Summary (Last 24 hours) at 11/26/2024 1201 Last data filed at 11/25/2024 1932 Gross per 24 hour  Intake 240 ml  Output 500 ml  Net -260 ml   [employer]  11/23/24 1442 11/24/24 0500 11/25/24 0500  Weight: 61.5 kg 61.4 kg 61.4 kg    Examination:  General exam: Elderly chronically ill, AAO x 2, no distress HEENT: no JVD Respiratory system: Decreased breath sounds at the bases Cardiovascular system: S1 & S2 heard, RRR.  Abd: nondistended, soft and nontender.Normal bowel sounds heard. Central nervous system: Alert and oriented. No  focal neurological deficits. Extremities: trace edema Skin: No rashes Psychiatry:  Mood & affect appropriate.     Data Reviewed:   CBC: Recent Labs  Lab 11/22/24 1111 11/23/24 0433 11/24/24 0823 11/25/24 0722 11/26/24 0422  WBC 3.7* 4.9 4.1 3.6* 6.7  NEUTROABS  --  4.0  --   --   --   HGB 8.5* 7.2* 8.4* 8.5* 8.4*  HCT 27.3* 23.4* 25.4* 26.3* 26.0*  MCV 102.2* 102.2* 95.8 97.8 99.2  PLT 143* 167 157 143* 160   Basic Metabolic Panel: Recent Labs  Lab 11/22/24 1111 11/22/24 1311 11/23/24 0433 11/24/24 0823 11/25/24 0722 11/26/24 0422  NA 137  --  136 139 139 137  K 4.3  --  4.4 3.6 3.7 3.9  CL 103  --  101 101 101 100  CO2 24  --  23 28 28 28   GLUCOSE 99  --  132* 105* 113* 130*  BUN 13  --  13 16 23  29*  CREATININE 0.87  --  0.89 0.97 1.23 1.23  CALCIUM  9.5  --  9.2 9.4 8.9 9.2  MG  --  1.8 1.7  --   --   --   PHOS  --   --  4.1  --   --   --    GFR: Estimated Creatinine Clearance: 38.1 mL/min (by C-G formula based on SCr of 1.23 mg/dL). Liver Function Tests: Recent Labs  Lab 11/22/24 1111 11/23/24 0433  AST 24 26  ALT <5 <5  ALKPHOS 50 43  BILITOT 0.7 0.7  PROT 7.9 7.4  ALBUMIN  3.4* 3.1*   No results for input(s): LIPASE, AMYLASE in the last 168 hours. No results for input(s): AMMONIA in the last 168 hours. Coagulation Profile: Recent Labs  Lab 11/23/24 2022  INR 1.3*   Cardiac Enzymes: No results for input(s): CKTOTAL, CKMB, CKMBINDEX, TROPONINI in the last 168 hours. BNP (last 3 results) Recent Labs    11/22/24 1311 11/23/24 0433  PROBNP 3,724.0* 3,736.0*   HbA1C: No results for input(s): HGBA1C in the last 72 hours. CBG: No results for input(s): GLUCAP in the last 168 hours. Lipid Profile: No results for input(s): CHOL, HDL, LDLCALC, TRIG, CHOLHDL, LDLDIRECT in the last 72 hours. Thyroid  Function Tests: No results for input(s): TSH, T4TOTAL, FREET4, T3FREE, THYROIDAB in the last 72  hours. Anemia Panel: Recent Labs    11/23/24 2022  VITAMINB12 1,518*  FOLATE 10.5   Urine analysis:    Component Value Date/Time   COLORURINE YELLOW 07/12/2024 1325   APPEARANCEUR HAZY (A) 07/12/2024 1325   LABSPEC 1.012 07/12/2024 1325   PHURINE 6.0 07/12/2024 1325   GLUCOSEU NEGATIVE 07/12/2024 1325   HGBUR NEGATIVE 07/12/2024 1325   BILIRUBINUR NEGATIVE 07/12/2024 1325   KETONESUR NEGATIVE 07/12/2024 1325   PROTEINUR NEGATIVE 07/12/2024 1325   UROBILINOGEN 0.2 11/22/2011 0247   NITRITE POSITIVE (A) 07/12/2024 1325   LEUKOCYTESUR TRACE (A) 07/12/2024 1325   Sepsis Labs: @LABRCNTIP (procalcitonin:4,lacticidven:4)  ) Recent Results (from the past 240 hours)  Body fluid culture w Gram Stain     Status: None   Collection Time: 11/22/24  8:32  PM   Specimen: Pleural Fluid  Result Value Ref Range Status   Specimen Description FLUID  Final   Special Requests NONE  Final   Gram Stain   Final    FEW WBC PRESENT, PREDOMINANTLY PMN NO ORGANISMS SEEN    Culture   Final    NO GROWTH 3 DAYS Performed at [facility], [address] [place], [place] [address]    Report Status 11/26/2024 FINAL  Final  Gram stain     Status: None   Collection Time: 11/23/24  9:04 AM   Specimen: Pleura  Result Value Ref Range Status   Specimen Description PLEURAL  Final   Special Requests NONE  Final   Gram Stain   Final    WBC PRESENT, PREDOMINANTLY MONONUCLEAR NO ORGANISMS SEEN CYTOSPIN SMEAR Performed at [facility], [address], [place], [place] [address]    Report Status 11/23/2024 FINAL  Final     Radiology Studies: DG CHEST PORT 1 VIEW Result Date: 11/25/2024 EXAM: 1 VIEW(S) XRAY OF THE CHEST 11/25/2024 07:17:00 AM COMPARISON: 11/24/2024 CLINICAL HISTORY: Pneumothorax FINDINGS: LINES, TUBES AND DEVICES: Left chest AICD in place. LUNGS AND PLEURA: Stable small right pleural effusion. Persistent small left basilar pneumothorax. Decreased interstitial opacities.  Persistent masslike airspace opacities in left lung base. HEART AND MEDIASTINUM: No acute abnormality of the cardiac and mediastinal silhouettes. BONES AND SOFT TISSUES: Thoracolumbar surgical hardware noted. IMPRESSION: 1. Persistent small left basilar pneumothorax. This measures approximately 1.7 cm in thickness over the left costophrenic angle versus 1.4 cm previously. 2. Persistent masslike airspace opacities in the left lung base. 3. Stable small right pleural effusion. Electronically signed by: Taylor Stroud MD 11/25/2024 08:55 AM EST RP Workstation: [id number]     Scheduled Meds:  cycloSPORINE   1 drop Both Eyes BID   dorzolamide -timolol   1 drop Both Eyes BID   enoxaparin  (LOVENOX ) injection  40 mg Subcutaneous Q24H   fluticasone  furoate-vilanterol  1 puff Inhalation q morning   isosorbide  mononitrate  30 mg Oral Daily   ketorolac   7.5 mg Intravenous Once   lidocaine   1 patch Transdermal Q24H   lidocaine   10 mL     metoprolol  succinate  12.5 mg Oral Daily   polyethylene glycol  17 g Oral Daily   senna-docusate  1 tablet Oral BID   Continuous Infusions:  promethazine  (PHENERGAN ) injection (IM or IVPB)       LOS: 3 days    Time spent:    Sigurd Pac, MD [facility]   11/26/2024, 12:01 PM    "

## 2024-11-26 NOTE — Plan of Care (Signed)

## 2024-11-27 ENCOUNTER — Inpatient Hospital Stay: Admitting: Physician Assistant

## 2024-11-27 ENCOUNTER — Inpatient Hospital Stay

## 2024-11-27 ENCOUNTER — Inpatient Hospital Stay: Attending: Internal Medicine

## 2024-11-27 DIAGNOSIS — I5043 Acute on chronic combined systolic (congestive) and diastolic (congestive) heart failure: Secondary | ICD-10-CM | POA: Diagnosis not present

## 2024-11-27 LAB — CYTOLOGY - NON PAP

## 2024-11-27 LAB — BASIC METABOLIC PANEL WITH GFR
Anion gap: 12 (ref 5–15)
BUN: 29 mg/dL — ABNORMAL HIGH (ref 8–23)
CO2: 27 mmol/L (ref 22–32)
Calcium: 9.4 mg/dL (ref 8.9–10.3)
Chloride: 101 mmol/L (ref 98–111)
Creatinine, Ser: 1.22 mg/dL (ref 0.61–1.24)
GFR, Estimated: 58 mL/min — ABNORMAL LOW
Glucose, Bld: 128 mg/dL — ABNORMAL HIGH (ref 70–99)
Potassium: 3.6 mmol/L (ref 3.5–5.1)
Sodium: 141 mmol/L (ref 135–145)

## 2024-11-27 LAB — CBC
HCT: 25.9 % — ABNORMAL LOW (ref 39.0–52.0)
Hemoglobin: 8.4 g/dL — ABNORMAL LOW (ref 13.0–17.0)
MCH: 31.5 pg (ref 26.0–34.0)
MCHC: 32.4 g/dL (ref 30.0–36.0)
MCV: 97 fL (ref 80.0–100.0)
Platelets: 188 K/uL (ref 150–400)
RBC: 2.67 MIL/uL — ABNORMAL LOW (ref 4.22–5.81)
RDW: 16.5 % — ABNORMAL HIGH (ref 11.5–15.5)
WBC: 7.5 K/uL (ref 4.0–10.5)
nRBC: 0 % (ref 0.0–0.2)

## 2024-11-27 MED ORDER — LACTULOSE 10 GM/15ML PO SOLN
20.0000 g | Freq: Two times a day (BID) | ORAL | Status: DC
  Administered 2024-11-27 – 2024-11-29 (×5): 20 g via ORAL
  Filled 2024-11-27 (×5): qty 30

## 2024-11-27 NOTE — Plan of Care (Signed)
" °  Problem: Education: Goal: Knowledge of General Education information will improve Description: Including pain rating scale, medication(s)/side effects and non-pharmacologic comfort measures 11/27/2024 2035 by Georgann Bramble K, RN Outcome: Progressing 11/27/2024 2034 by Florian Dellis POUR, RN Outcome: Progressing   Problem: Health Behavior/Discharge Planning: Goal: Ability to manage health-related needs will improve 11/27/2024 2035 by Salem Lembke K, RN Outcome: Progressing 11/27/2024 2034 by Florian Dellis POUR, RN Outcome: Progressing   Problem: Clinical Measurements: Goal: Ability to maintain clinical measurements within normal limits will improve 11/27/2024 2035 by Chirstine Defrain K, RN Outcome: Progressing 11/27/2024 2034 by Florian Dellis POUR, RN Outcome: Progressing Goal: Will remain free from infection 11/27/2024 2035 by Florian Dellis POUR, RN Outcome: Progressing 11/27/2024 2034 by Rebbecca Osuna K, RN Outcome: Progressing Goal: Diagnostic test results will improve 11/27/2024 2035 by Florian Dellis POUR, RN Outcome: Progressing 11/27/2024 2034 by Jabril Pursell K, RN Outcome: Progressing Goal: Respiratory complications will improve 11/27/2024 2035 by Florian Dellis POUR, RN Outcome: Progressing 11/27/2024 2034 by Ailany Koren K, RN Outcome: Progressing Goal: Cardiovascular complication will be avoided 11/27/2024 2035 by Florian Dellis POUR, RN Outcome: Progressing 11/27/2024 2034 by Cherre Kothari K, RN Outcome: Progressing   Problem: Activity: Goal: Risk for activity intolerance will decrease 11/27/2024 2035 by Lashaundra Lehrmann K, RN Outcome: Progressing 11/27/2024 2034 by Florian Dellis POUR, RN Outcome: Progressing   Problem: Nutrition: Goal: Adequate nutrition will be maintained 11/27/2024 2035 by Florian Dellis POUR, RN Outcome: Progressing 11/27/2024 2034 by Florian Dellis POUR, RN Outcome: Progressing   Problem: Coping: Goal: Level of anxiety will decrease 11/27/2024 2035 by Florian Dellis POUR, RN Outcome:  Progressing 11/27/2024 2034 by Florian Dellis POUR, RN Outcome: Progressing   Problem: Elimination: Goal: Will not experience complications related to bowel motility 11/27/2024 2035 by Florian Dellis POUR, RN Outcome: Progressing 11/27/2024 2034 by Florian Dellis POUR, RN Outcome: Progressing Goal: Will not experience complications related to urinary retention 11/27/2024 2035 by Florian Dellis POUR, RN Outcome: Progressing 11/27/2024 2034 by Florian Dellis POUR, RN Outcome: Progressing   Problem: Pain Managment: Goal: General experience of comfort will improve and/or be controlled 11/27/2024 2035 by Florian Dellis POUR, RN Outcome: Progressing 11/27/2024 2034 by Florian Dellis POUR, RN Outcome: Progressing   Problem: Safety: Goal: Ability to remain free from injury will improve 11/27/2024 2035 by Florian Dellis POUR, RN Outcome: Progressing 11/27/2024 2034 by Florian Dellis POUR, RN Outcome: Progressing   Problem: Skin Integrity: Goal: Risk for impaired skin integrity will decrease 11/27/2024 2035 by Luisa Louk K, RN Outcome: Progressing 11/27/2024 2034 by Jasie Meleski K, RN Outcome: Progressing   "

## 2024-11-27 NOTE — Progress Notes (Signed)
 Physical Therapy Treatment Patient Details Name: [name] MRN: [id number] DOB: [birth year] Today's Date: 11/27/2024   History of Present Illness Pt is 84 yo presenting to [facility] on 1/1 due to worsening shortness of breath. Pt was found to be tachycardic/tachypneic in the ED. No PE Moderate R and partially loculated L effusion with compressive atelectasis. S/P R thoracentesis with 800 ML drained. Then L thoracentesis while in ER. 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    PT Comments  Pt received in supine, agreeable to therapy session, pt with good participation but standing time limited due to pt with bowel incontinence up on standing and unaware, needing assist to transfer to [facility] and NT arriving to assist him with bed linen change/hygiene at end of session. Pt requesting some time up on [facility], so PTA to return to room later in the day to work on gait/stair progression if time. Pt continues to benefit from PT services to progress toward functional mobility goals. Continue to recommend HHPT upon DC as long as pt able to perform flight of stairs prior to DC.    If plan is discharge home, recommend the following: A little help with walking and/or transfers;Help with stairs or ramp for entrance;Assistance with cooking/housework;Assist for transportation   Can travel by private vehicle        Equipment Recommendations  None recommended by PT    Recommendations for Other Services       Precautions / Restrictions Precautions Precautions: Fall Recall of Precautions/Restrictions: Intact Precaution/Restrictions Comments: some bowel incontinence, briefs may help Restrictions Weight Bearing Restrictions Per Provider Order: No     Mobility  Bed Mobility Overal bed mobility: Modified Independent             General bed mobility comments: to L EOB, pt using bed rail    Transfers Overall transfer level: Needs assistance Equipment used:  Rolling walker (2 wheels) Transfers: Sit to/from Stand, Bed to chair/wheelchair/BSC Sit to Stand: Supervision   Step pivot transfers: Contact guard assist       General transfer comment: CGA to pivot to BSC using RW due to pt with loose stools and decreased safety with stand>Sit, needs reminders for safe UE placement. NT arriving to change bed linens for pt and assist him with hygiene at end of session.    Ambulation/Gait               General Gait Details: pt with need for some time on [facility] for +BM, so defers until later in the day.   Stairs             Wheelchair Mobility     Tilt Bed    Modified Rankin (Stroke Patients Only)       Balance Overall balance assessment: Needs assistance Sitting-balance support: No upper extremity supported, Feet supported Sitting balance-Leahy Scale: Good     Standing balance support: Bilateral upper extremity supported, During functional activity Standing balance-Leahy Scale: Fair Standing balance comment: Fair with RW                            Communication Communication Communication: No apparent difficulties  Cognition Arousal: Alert Behavior During Therapy: WFL for tasks assessed/performed   PT - Cognitive impairments: No apparent impairments                       PT - Cognition Comments: some  bowel incontinence today and pt seemed unaware, only was aware of needing to urinate. Following commands: Intact      Cueing Cueing Techniques: Verbal cues, Gestural cues  Exercises      General Comments General comments (skin integrity, edema, etc.): no acute s/sx distress, pt with loose watery stools upon standing from EOB, pt able to hold urinal to stand and urinate, pt assisted on to [facility] due to loose stools and agrees to try to have BM at end of session, requesting therapist return later in the day to work on gait/stair training after he is cleaned up.      Pertinent Vitals/Pain Pain  Assessment Pain Assessment: No/denies pain    Home Living                          Prior Function            PT Goals (current goals can now be found in the care plan section) Acute Rehab PT Goals Patient Stated Goal: improve mobility and return home. PT Goal Formulation: With patient Time For Goal Achievement: 12/07/24 Progress towards PT goals: Progressing toward goals    Frequency    Min 2X/week      PT Plan      Co-evaluation              AM-PAC PT 6 Clicks Mobility   Outcome Measure  Help needed turning from your back to your side while in a flat bed without using bedrails?: None Help needed moving from lying on your back to sitting on the side of a flat bed without using bedrails?: None Help needed moving to and from a bed to a chair (including a wheelchair)?: A Little Help needed standing up from a chair using your arms (e.g., wheelchair or bedside chair)?: A Little Help needed to walk in hospital room?: A Little Help needed climbing 3-5 steps with a railing? : A Little 6 Click Score: 20    End of Session Equipment Utilized During Treatment:  (pt defers gait belt as he is only pivoting, in a rush to get to [facility]) Activity Tolerance: Patient tolerated treatment well;Treatment limited secondary to medical complications (Comment);Other (comment) (loose stools/bowel incontinence limiting standing tolerance) Patient left: with call bell/phone within reach;with nursing/sitter in room;Other (comment) (pt up on [facility], NT in room to assist him, NT changing his bed sheets) Nurse Communication: Mobility status;Other (comment) (+ BM) PT Visit Diagnosis: Other abnormalities of gait and mobility (R26.89);Muscle weakness (generalized) (M62.81)     Time: 8455-8447 PT Time Calculation (min) (ACUTE ONLY): 8 min  Charges:    $Therapeutic Activity: 8-22 mins PT General Charges $$ ACUTE PT VISIT: 1 Visit                     Deagen Krass P., PTA Acute  Rehabilitation Services Secure Chat Preferred 9a-5:30pm Office: [phone]    Connell HERO [facility] 11/27/2024, 4:20 PM

## 2024-11-27 NOTE — Plan of Care (Signed)

## 2024-11-27 NOTE — Progress Notes (Incomplete Revision)
 " PROGRESS NOTE    [name]  FMW:[id number] DOB: Feb 07, 1941 DOA: 11/22/2024 PCP: [name], MD  [name] with chronic systolic CHF, VT, AICD, chronic anemia, stage IV adeno CA left lung recently undergoing chemotherapy presented to the ED with chest pain and worsening shortness of breath.  In the ED he was tachycardic, tachypneic, labs noted creatinine of 0.8, troponin 23, proBNP 3724, D-dimer 2.0, hemoglobin 8.5, CTA chest noted no PE, moderate right and partially loculated left effusion with compressive atelectasis.  Underwent right-sided thoracentesis by EDP, 800 mL fluid drained. - Subsequently had left thoracentesis per IR while in the ER - 1/3 x-ray for chest pain noted small left pneumothorax -1/5: d/w Pulm again, conservative mgt, poor candidate for invasive mgt w/ Chest tube etc -1/6: Discussed with oncology, poor prognosis, palliative care consulted  Subjective: - Chest pain is a little better, some dyspnea, eating better today, having Bms now  Assessment and Plan:  Acute on chronic combined systolic/diastolic CHF - Last echo 11/25 noted EF 45-50%, grade 1 DD, normal RV - Suspect his pleural effusions are multifactorial likely related to both malignancy as well as CHF - BP soft, switch to oral torsemide  today, continue low-dose Toprol  -- Increase activity, PT OT eval - Palliative consulted  Bilateral pleural effusions Malignant pleural effusion -sp thoracentesis on both sides 1/2 morning in the ER - Unfortunately pleural fluid protein and LDH were not checked, cultures negative so far, pleural fluid left lung is positive for malignancy, risk of re-accumulation -Right pleural cytology is pending - Suspect multifactorial etiology-both CHF as well as malignancy contributing -palliative consulted, d/w Dr.Mohamed  Left PTX - Postthoracentesis 1/2 - Appreciate pulmonary input, suspected to be lung entrapment on the left the setting of cancer, less likely malignant BP  fistula - Repeat x-ray with small basilar PTX, largely asymptomatic will discuss with pulmonary today, felt to be a poor candidate for invasive management, chest tube etc., recommended conservative management for now  Adeno CA left lung, stage IV -sp XRT 9/24-11/24 - Was recently undergoing chemotherapy, currently on hold in the setting of recent bacteremia - Followed by Dr. Gatha - Called and discussed with Dr. Gatha, felt to be a poor candidate for ongoing chemo, aggressive treatment, he agreed with palliative care  Ethics: Chronically ill and frail with CHF, metastatic lung cancer, malignant pleural effusion, I worry he is at risk of ongoing decline, discussed poor prognosis with patient and spouse - Palliative consulted for goals of care  Persistent chest pain - Secondary to all of the above, CTA negative for PE, no ACS,  has right sided chest wall tenderness at the site of the pain, given Toradol  earlier, continue lidocaine  patch  Acute on chronic anemia History of iron deficiency anemia - Hemoglobin dropped to 7.2 this admission, transfused 1 unit of PRBC, now improved and stable  History of paroxysmal ventricular tachycardia - Monitor electrolytes, continue Toprol   Recent bacteremia, staph lugdunensis - Completed 6 weeks of cefazolin , TEE was negative    DVT prophylaxis:  Lovenox  Code Status: DNR Family Communication: No family at bedside, called and updated wife Cassandra Disposition Plan: To be determined  Procedures: Right and left thoracentesis 1/2   Antimicrobials:    Objective: Vitals:   11/26/24 0952 11/26/24 1749 11/27/24 0856 11/27/24 0943  BP: 121/68 (!) 146/79  (!) 147/82  Pulse: 100 100  99  Resp: 16 16  16   Temp: 99.1 F (37.3 C) 97.7 F (36.5 C)  98.9 F (37.2 C)  TempSrc: Oral Oral  Oral  SpO2: 100% 95% 95%   Weight:      Height:        Intake/Output Summary (Last 24 hours) at 11/27/2024 1207 Last data filed at 11/27/2024 0843 Gross per  24 hour  Intake --  Output 300 ml  Net -300 ml   Filed Weights   11/23/24 1442 11/24/24 0500 11/25/24 0500  Weight: 61.5 kg 61.4 kg 61.4 kg    Examination:  General exam: Elderly chronically ill, AAO x 2, no distress HEENT: no JVD Respiratory system: Decreased breath sounds at the bases Cardiovascular system: S1 & S2 heard, RRR.  Abd: nondistended, soft and nontender.Normal bowel sounds heard. Central nervous system: Alert and oriented. No focal neurological deficits. Extremities: trace edema Skin: No rashes Psychiatry:  Mood & affect appropriate.     Data Reviewed:   CBC: Recent Labs  Lab 11/23/24 0433 11/24/24 0823 11/25/24 0722 11/26/24 0422 11/27/24 0506  WBC 4.9 4.1 3.6* 6.7 7.5  NEUTROABS 4.0  --   --   --   --   HGB 7.2* 8.4* 8.5* 8.4* 8.4*  HCT 23.4* 25.4* 26.3* 26.0* 25.9*  MCV 102.2* 95.8 97.8 99.2 97.0  PLT 167 157 143* 160 188   Basic Metabolic Panel: Recent Labs  Lab 11/22/24 1311 11/23/24 0433 11/24/24 0823 11/25/24 0722 11/26/24 0422 11/27/24 0506  NA  --  136 139 139 137 141  K  --  4.4 3.6 3.7 3.9 3.6  CL  --  101 101 101 100 101  CO2  --  23 28 28 28 27   GLUCOSE  --  132* 105* 113* 130* 128*  BUN  --  13 16 23  29* 29*  CREATININE  --  0.89 0.97 1.23 1.23 1.22  CALCIUM   --  9.2 9.4 8.9 9.2 9.4  MG 1.8 1.7  --   --   --   --   PHOS  --  4.1  --   --   --   --    GFR: Estimated Creatinine Clearance: 38.4 mL/min (by C-G formula based on SCr of 1.22 mg/dL). Liver Function Tests: Recent Labs  Lab 11/22/24 1111 11/23/24 0433  AST 24 26  ALT <5 <5  ALKPHOS 50 43  BILITOT 0.7 0.7  PROT 7.9 7.4  ALBUMIN  3.4* 3.1*   No results for input(s): LIPASE, AMYLASE in the last 168 hours. No results for input(s): AMMONIA in the last 168 hours. Coagulation Profile: Recent Labs  Lab 11/23/24 2022  INR 1.3*   Cardiac Enzymes: No results for input(s): CKTOTAL, CKMB, CKMBINDEX, TROPONINI in the last 168 hours. BNP (last 3  results) Recent Labs    11/22/24 1311 11/23/24 0433  PROBNP 3,724.0* 3,736.0*   HbA1C: No results for input(s): HGBA1C in the last 72 hours. CBG: No results for input(s): GLUCAP in the last 168 hours. Lipid Profile: No results for input(s): CHOL, HDL, LDLCALC, TRIG, CHOLHDL, LDLDIRECT in the last 72 hours. Thyroid  Function Tests: No results for input(s): TSH, T4TOTAL, FREET4, T3FREE, THYROIDAB in the last 72 hours. Anemia Panel: No results for input(s): VITAMINB12, FOLATE, FERRITIN, TIBC, IRON, RETICCTPCT in the last 72 hours.  Urine analysis:    Component Value Date/Time   COLORURINE YELLOW 07/12/2024 1325   APPEARANCEUR HAZY (A) 07/12/2024 1325   LABSPEC 1.012 07/12/2024 1325   PHURINE 6.0 07/12/2024 1325   GLUCOSEU NEGATIVE 07/12/2024 1325   HGBUR NEGATIVE 07/12/2024 1325   BILIRUBINUR NEGATIVE 07/12/2024 1325   KETONESUR NEGATIVE  07/12/2024 1325   PROTEINUR NEGATIVE 07/12/2024 1325   UROBILINOGEN 0.2 11/22/2011 0247   NITRITE POSITIVE (A) 07/12/2024 1325   LEUKOCYTESUR TRACE (A) 07/12/2024 1325   Sepsis Labs: @LABRCNTIP (procalcitonin:4,lacticidven:4)  ) Recent Results (from the past 240 hours)  Body fluid culture w Gram Stain     Status: None   Collection Time: 11/22/24  8:32 PM   Specimen: Pleural Fluid  Result Value Ref Range Status   Specimen Description FLUID  Final   Special Requests NONE  Final   Gram Stain   Final    FEW WBC PRESENT, PREDOMINANTLY PMN NO ORGANISMS SEEN    Culture   Final    NO GROWTH 3 DAYS Performed at [facility], [address], [place], [place] [address]    Report Status 11/26/2024 FINAL  Final  Gram stain     Status: None   Collection Time: 11/23/24  9:04 AM   Specimen: Pleura  Result Value Ref Range Status   Specimen Description PLEURAL  Final   Special Requests NONE  Final   Gram Stain   Final    WBC PRESENT, PREDOMINANTLY MONONUCLEAR NO ORGANISMS SEEN CYTOSPIN  SMEAR Performed at [facility], [address], [place], [place] [address]    Report Status 11/23/2024 FINAL  Final     Radiology Studies: DG CHEST PORT 1 VIEW Result Date: 11/26/2024 CLINICAL DATA:  Follow-up persistent small left basilar pneumothorax. EXAM: PORTABLE CHEST 1 VIEW COMPARISON:  11/25/2024 FINDINGS: The previously demonstrated 1.7 cm thick left basilar pneumothorax currently measures 1.4 cm thick. Mild increase in patchy and linear density at the left lung base and increased patchy density at the right lung base. Stable small right pleural effusion. Normal-sized heart. Stable left subclavian pacemaker leads. Thoracic spine degenerative changes and thoracolumbar spine fixation hardware. IMPRESSION: 1. Mild decrease in size of the previously demonstrated 1.7 cm thick left basilar pneumothorax. 2. Mild increase in bibasilar atelectasis and/or pneumonia. 3. Stable small right pleural effusion. Electronically Signed   By: Elspeth Bathe M.D.   On: 11/26/2024 17:25     Scheduled Meds:  cycloSPORINE   1 drop Both Eyes BID   dorzolamide -timolol   1 drop Both Eyes BID   enoxaparin  (LOVENOX ) injection  40 mg Subcutaneous Q24H   fluticasone  furoate-vilanterol  1 puff Inhalation q morning   ketorolac   7.5 mg Intravenous Once   lactulose   20 g Oral BID   lidocaine   1 patch Transdermal Q24H   lidocaine   10 mL     metoprolol  succinate  12.5 mg Oral Daily   polyethylene glycol  17 g Oral Daily   senna-docusate  1 tablet Oral BID   torsemide   20 mg Oral Daily   Continuous Infusions:  promethazine  (PHENERGAN ) injection (IM or IVPB)       LOS: 4 days    Time spent:    Sigurd Pac, MD [facility]   11/27/2024, 12:07 PM    "

## 2024-11-27 NOTE — Progress Notes (Addendum)
 " PROGRESS NOTE    [name]  FMW:[id number] DOB: [birth year] DOA: 11/22/2024 PCP: [name], MD  [name] with chronic systolic CHF, VT, AICD, chronic anemia, stage III/IV adeno CA left lung recently undergoing chemotherapy presented to the ED with chest pain and worsening shortness of breath.  In the ED he was tachycardic, tachypneic, labs noted creatinine of 0.8, troponin 23, proBNP 3724, D-dimer 2.0, hemoglobin 8.5, CTA chest noted no PE, moderate right and partially loculated left effusion with compressive atelectasis.  Underwent right-sided thoracentesis by EDP, 800 mL fluid drained. - Subsequently had left thoracentesis per IR while in the ER - 1/3 x-ray for chest pain noted small left pneumothorax -1/5: d/w Pulm again, conservative mgt, poor candidate for invasive mgt w/ Chest tube etc  Subjective: - Chest pain is a little better, some dyspnea, eating better today, having Bms now  Assessment and Plan:  Acute on chronic combined systolic/diastolic CHF - Last echo 11/25 noted EF 45-50%, grade 1 DD, normal RV - Suspect his pleural effusions are multifactorial likely related to both malignancy as well as CHF - BP soft, switch to oral torsemide  today, continue low-dose Toprol  -- Increase activity, PT OT eval - Palliative consulted  Bilateral pleural effusions -sp thoracentesis on both sides 1/2 morning in the ER - Unfortunately pleural fluid protein and LDH were not checked, cultures negative so far, pleural fluid left lung is positive for malignancy, risk of re-accumulation -Right pleural cytology is pending - Suspect multifactorial etiology-both CHF as well as malignancy contributing -palliative consulted, d/w Dr.Mohamed  Left PTX - Postthoracentesis 1/2 - Appreciate pulmonary input, suspected to be lung entrapment on the left the setting of cancer, less likely malignant BP fistula - Repeat x-ray with small basilar PTX, largely asymptomatic will discuss with pulmonary today,  felt to be a poor candidate for invasive management, chest tube etc., recommended conservative management for now  Adeno CA left lung, stage IV -sp XRT 9/24-11/24 - Was recently undergoing chemotherapy, currently on hold in the setting of recent bacteremia - Followed by Dr. Gatha - Called and discussed with Dr. Gatha, felt to be a poor candidate for ongoing chemo, aggressive treatment, he agreed with palliative care  Ethics: Chronically ill and frail with CHF, metastatic lung cancer, malignant pleural effusion, I worry he is at risk of ongoing decline, discussed poor prognosis with patient and spouse - Palliative consulted for goals of care  Persistent chest pain - Secondary to all of the above, CTA negative for PE, no ACS,  has right sided chest wall tenderness at the site of the pain, given Toradol  earlier, continue lidocaine  patch  Acute on chronic anemia History of iron deficiency anemia - Hemoglobin dropped to 7.2 this admission, transfused 1 unit of PRBC, now improved and stable  History of paroxysmal ventricular tachycardia - Monitor electrolytes, continue Toprol   Recent bacteremia, staph lugdunensis - Completed 6 weeks of cefazolin , TEE was negative    DVT prophylaxis:  Lovenox  Code Status: DNR Family Communication: No family at bedside, called and updated wife Cassandra Disposition Plan: To be determined  Procedures: Right and left thoracentesis 1/2   Antimicrobials:    Objective: Vitals:   11/26/24 0952 11/26/24 1749 11/27/24 0856 11/27/24 0943  BP: 121/68 (!) 146/79  (!) 147/82  Pulse: 100 100  99  Resp: 16 16  16   Temp: 99.1 F (37.3 C) 97.7 F (36.5 C)  98.9 F (37.2 C)  TempSrc: Oral Oral  Oral  SpO2: 100% 95% 95%  Weight:      Height:        Intake/Output Summary (Last 24 hours) at 11/27/2024 1207 Last data filed at 11/27/2024 0843 Gross per 24 hour  Intake --  Output 300 ml  Net -300 ml   Filed Weights   11/23/24 1442 11/24/24 0500  11/25/24 0500  Weight: 61.5 kg 61.4 kg 61.4 kg    Examination:  General exam: Elderly chronically ill, AAO x 2, no distress HEENT: no JVD Respiratory system: Decreased breath sounds at the bases Cardiovascular system: S1 & S2 heard, RRR.  Abd: nondistended, soft and nontender.Normal bowel sounds heard. Central nervous system: Alert and oriented. No focal neurological deficits. Extremities: trace edema Skin: No rashes Psychiatry:  Mood & affect appropriate.     Data Reviewed:   CBC: Recent Labs  Lab 11/23/24 0433 11/24/24 0823 11/25/24 0722 11/26/24 0422 11/27/24 0506  WBC 4.9 4.1 3.6* 6.7 7.5  NEUTROABS 4.0  --   --   --   --   HGB 7.2* 8.4* 8.5* 8.4* 8.4*  HCT 23.4* 25.4* 26.3* 26.0* 25.9*  MCV 102.2* 95.8 97.8 99.2 97.0  PLT 167 157 143* 160 188   Basic Metabolic Panel: Recent Labs  Lab 11/22/24 1311 11/23/24 0433 11/24/24 0823 11/25/24 0722 11/26/24 0422 11/27/24 0506  NA  --  136 139 139 137 141  K  --  4.4 3.6 3.7 3.9 3.6  CL  --  101 101 101 100 101  CO2  --  23 28 28 28 27   GLUCOSE  --  132* 105* 113* 130* 128*  BUN  --  13 16 23  29* 29*  CREATININE  --  0.89 0.97 1.23 1.23 1.22  CALCIUM   --  9.2 9.4 8.9 9.2 9.4  MG 1.8 1.7  --   --   --   --   PHOS  --  4.1  --   --   --   --    GFR: Estimated Creatinine Clearance: 38.4 mL/min (by C-G formula based on SCr of 1.22 mg/dL). Liver Function Tests: Recent Labs  Lab 11/22/24 1111 11/23/24 0433  AST 24 26  ALT <5 <5  ALKPHOS 50 43  BILITOT 0.7 0.7  PROT 7.9 7.4  ALBUMIN  3.4* 3.1*   No results for input(s): LIPASE, AMYLASE in the last 168 hours. No results for input(s): AMMONIA in the last 168 hours. Coagulation Profile: Recent Labs  Lab 11/23/24 2022  INR 1.3*   Cardiac Enzymes: No results for input(s): CKTOTAL, CKMB, CKMBINDEX, TROPONINI in the last 168 hours. BNP (last 3 results) Recent Labs    11/22/24 1311 11/23/24 0433  PROBNP 3,724.0* 3,736.0*   HbA1C: No  results for input(s): HGBA1C in the last 72 hours. CBG: No results for input(s): GLUCAP in the last 168 hours. Lipid Profile: No results for input(s): CHOL, HDL, LDLCALC, TRIG, CHOLHDL, LDLDIRECT in the last 72 hours. Thyroid  Function Tests: No results for input(s): TSH, T4TOTAL, FREET4, T3FREE, THYROIDAB in the last 72 hours. Anemia Panel: No results for input(s): VITAMINB12, FOLATE, FERRITIN, TIBC, IRON, RETICCTPCT in the last 72 hours.  Urine analysis:    Component Value Date/Time   COLORURINE YELLOW 07/12/2024 1325   APPEARANCEUR HAZY (A) 07/12/2024 1325   LABSPEC 1.012 07/12/2024 1325   PHURINE 6.0 07/12/2024 1325   GLUCOSEU NEGATIVE 07/12/2024 1325   HGBUR NEGATIVE 07/12/2024 1325   BILIRUBINUR NEGATIVE 07/12/2024 1325   KETONESUR NEGATIVE 07/12/2024 1325   PROTEINUR NEGATIVE 07/12/2024 1325   UROBILINOGEN 0.2  11/22/2011 0247   NITRITE POSITIVE (A) 07/12/2024 1325   LEUKOCYTESUR TRACE (A) 07/12/2024 1325   Sepsis Labs: @LABRCNTIP (procalcitonin:4,lacticidven:4)  ) Recent Results (from the past 240 hours)  Body fluid culture w Gram Stain     Status: None   Collection Time: 11/22/24  8:32 PM   Specimen: Pleural Fluid  Result Value Ref Range Status   Specimen Description FLUID  Final   Special Requests NONE  Final   Gram Stain   Final    FEW WBC PRESENT, PREDOMINANTLY PMN NO ORGANISMS SEEN    Culture   Final    NO GROWTH 3 DAYS Performed at [facility], [address], [place], [place] [address]    Report Status 11/26/2024 FINAL  Final  Gram stain     Status: None   Collection Time: 11/23/24  9:04 AM   Specimen: Pleura  Result Value Ref Range Status   Specimen Description PLEURAL  Final   Special Requests NONE  Final   Gram Stain   Final    WBC PRESENT, PREDOMINANTLY MONONUCLEAR NO ORGANISMS SEEN CYTOSPIN SMEAR Performed at [facility], [address] [place], [place] [address]    Report Status 11/23/2024  FINAL  Final     Radiology Studies: DG CHEST PORT 1 VIEW Result Date: 11/26/2024 CLINICAL DATA:  Follow-up persistent small left basilar pneumothorax. EXAM: PORTABLE CHEST 1 VIEW COMPARISON:  11/25/2024 FINDINGS: The previously demonstrated 1.7 cm thick left basilar pneumothorax currently measures 1.4 cm thick. Mild increase in patchy and linear density at the left lung base and increased patchy density at the right lung base. Stable small right pleural effusion. Normal-sized heart. Stable left subclavian pacemaker leads. Thoracic spine degenerative changes and thoracolumbar spine fixation hardware. IMPRESSION: 1. Mild decrease in size of the previously demonstrated 1.7 cm thick left basilar pneumothorax. 2. Mild increase in bibasilar atelectasis and/or pneumonia. 3. Stable small right pleural effusion. Electronically Signed   By: Elspeth Bathe M.D.   On: 11/26/2024 17:25     Scheduled Meds:  cycloSPORINE   1 drop Both Eyes BID   dorzolamide -timolol   1 drop Both Eyes BID   enoxaparin  (LOVENOX ) injection  40 mg Subcutaneous Q24H   fluticasone  furoate-vilanterol  1 puff Inhalation q morning   ketorolac   7.5 mg Intravenous Once   lactulose   20 g Oral BID   lidocaine   1 patch Transdermal Q24H   lidocaine   10 mL     metoprolol  succinate  12.5 mg Oral Daily   polyethylene glycol  17 g Oral Daily   senna-docusate  1 tablet Oral BID   torsemide   20 mg Oral Daily   Continuous Infusions:  promethazine  (PHENERGAN ) injection (IM or IVPB)       LOS: 4 days    Time spent:    Sigurd Pac, MD [facility]   11/27/2024, 12:07 PM    "

## 2024-11-28 DIAGNOSIS — Z515 Encounter for palliative care: Secondary | ICD-10-CM

## 2024-11-28 DIAGNOSIS — Z66 Do not resuscitate: Secondary | ICD-10-CM

## 2024-11-28 DIAGNOSIS — J910 Malignant pleural effusion: Secondary | ICD-10-CM

## 2024-11-28 DIAGNOSIS — C3492 Malignant neoplasm of unspecified part of left bronchus or lung: Secondary | ICD-10-CM

## 2024-11-28 NOTE — Progress Notes (Signed)
 Pt requests to administer Breo inhaler after he eats breakfast each morning.

## 2024-11-28 NOTE — Consult Note (Addendum)
 "                                                  Palliative Care Consult Note                                  Date: 11/28/2024   Patient Name: [name]  DOB:1941-06-01  FMW:[id number]  Age / Sex:84 y.o., male  PCP: [name], MD Referring Physician: Fairy Frames, MD  Reason for Consultation: Establishing goals of care  Past Medical History:  Diagnosis Date   AICD (automatic cardioverter/defibrillator) present    CAD (coronary artery disease) 80% stenosis diag of the LAD, 30% in OM2 branch of LCX in 2009    a. Nonobstructive CAD by cath 11/2011 with the exception of the pre-existing diagonal branch #2 lesion.   Chronic systolic CHF (congestive heart failure) (HCC)    CKD (chronic kidney disease) stage 3, GFR 30-59 ml/min (HCC)    Colon polyp, hyperplastic    History of kidney stones    History of radiation therapy    Left Lung- 08/17/23-09/29/23- Dr. Lynwood Nasuti   History of stress test 06/01/2012   Normal myocardial perfusion study. compared to the previous study there is no significant change. this is a low risk scan   Hypertension    Legally blind    both eyes   Myocardial infarction [facility]) 11/22/2011   NICM (nonischemic cardiomyopathy) (HCC)    a. Remote hx of dilated NICM with EF ranging 20-45%, including normal EF by echo (55-60%) in 2014.   Peripheral arterial disease    a. 06/2014: ABI right 0.99, left 1.2, LE dopplers revealing an occluded right posterior tibial. As symptoms were not felt r/t claudication, no further w/u at the time.   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 Requiring discontinuation of BB dose.   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     Assessment & Plan:   HPI/Patient Profile: 84 y.o. 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 Per H&P on 11/22/2024 by [facility] DO, patient found to have bilateral pleural effusion on CXR 11/22/2024 and on CT angio chest on 11/22/2024. Thoracentesis performed in the ED by Patt MD on 11/22/2024 with 800 mL of serosanguinous fluid that was positive for malignant pleural effusion on after cytology completed on 11/23/2024.  Per progress note 11/28/2024 by Fairy MD, discussions with patient's primary oncologist Dr. Gatha does not think that the patient is a good candidate for ongoing chemotherapy or further aggressive treatment they believe that palliative/hospice care might be the best course for the patient.  Palliative medicine consulted for goals of care discussion.   SUMMARY OF RECOMMENDATIONS   DNR limited TOC order placed for home with hospice ICD needs to be deactivated prior to discharge with home hospice Continue current measures  Symptom Management:  Per primary team  Code Status: DNR - Limited (DNR/DNI)  Prognosis:  < 6 months  Discharge Planning:  Home with Hospice   Discussed with: Fairy  MD 11/28/2024 about the patient and family's decision to pursue home with hospice but to continue current measures at this time.  Also spoke with social worker to get in contact with hospice to see for home with hospice.   Subjective:   Reviewed medical records, received report from team, assessed the patient and then meet at the patient's bedside to discuss diagnosis, prognosis, GOC, EOL wishes disposition and options.  I met with the patient and his wife [name] at bedside.   We meet to discuss diagnosis prognosis, GOC, EOL wishes, disposition and options. Concept of Palliative Care was introduced as specialized medical care for people and their families living with serious illness.  If focuses on providing relief from the symptoms and stress of a serious illness.  The goal is to improve quality of  life for both the patient and the family. Values and goals of care important to patient and family were attempted to be elicited.  Created space and opportunity for patient  and family to explore thoughts and feelings regarding current medical situation   Natural trajectory and current clinical status were discussed. Questions and concerns addressed. Patient encouraged to call with questions or concerns.    Patient/Family Understanding of Illness: - Patient and family have a good understanding that the overall prognosis is poor given his lung cancer, malignant recurrent pleural effusion and high risk for reaccumulation, and overall frailty, and congestive heart failure  Patient Values: - Patient repeatedly expresses the importance of religion to him, and is trust in God  Today's Discussion: - Discussed with patient about his overall health, current hospitalization, and next steps - Initially inquired about the patient's understanding of his current medical illness and shared that he initially came in for shortness of breath, and later was found to be a pleural effusion that is due to his cancer, and shares that the medical team has repeatedly said that further aggressive interventions is not going to help him, and that they recommend hospice - Patient and wife expressed understanding of hospice and were agreeable to begin work on home with hospice - Shared the hospice philosophy with patient and wife, sure that the goal is no longer to cure, but that we are now only focusing on his comfort such as addressing his shortness of breath, pain, anxiety, and other discomforts - Shared that when we transition to hospice that it means that the patient has a limited time left -Patient and wife very clear that they would not want him to go to a hospice facility at this time, and that he wants to go home with hospice - With home with hospice looks like that would mean full concentration was symptoms, and he  will get a bath aide or nurse aide to visit a couple times a week to help with him bathing changes bed, and that a nurse will come out a couple times a week as well to continue to assess his symptoms and see if he needs any medication adjustments  - Discussed with him that there may come a time where his symptoms cannot be well-managed at home and that there is an option to go to a inpatient hospice facility in order to make sure that he is comfortable, patient seems agreeable that if his symptoms were not adequately treated at home or unable to be adequately treated at home he may be open to going to a hospice facility, but that it is up to him whether or not he goes - Inquired  if the patient had any concerns or worries going forward, he shares that he does not have any worries and that he has faith in God  Review of Systems  Respiratory:  Positive for shortness of breath.   All other systems reviewed and are negative.   Objective:   Primary Diagnoses: Present on Admission:  Pleural effusion  Acute on chronic combined systolic and diastolic CHF (congestive heart failure) (HCC)  SOB (shortness of breath)  Pancytopenia (HCC)  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)  Paroxysmal ventricular tachycardia (HCC)  Anemia of chronic disease   Vital Signs:  BP (!) 103/59 (BP Location: Right Arm)   Pulse 100   Temp 98 F (36.7 C) (Oral)   Resp 16   Ht 5' 7 (1.702 m)   Wt 58.8 kg   SpO2 98%   BMI 20.30 kg/m   Physical Exam Constitutional:      Appearance: He is ill-appearing.     Comments: Cachectic   HENT:     Head: Normocephalic and atraumatic.  Eyes:     Extraocular Movements: Extraocular movements intact.  Cardiovascular:     Rate and Rhythm: Tachycardia present.  Pulmonary:     Effort: Pulmonary effort is normal.  Skin:    General: Skin is warm and dry.  Neurological:     General: No focal deficit present.     Mental Status: He is alert.  Psychiatric:         Mood and Affect: Mood normal.     Palliative Assessment/Data: 50%   Thank you for allowing us  to participate in the care of KAINEN STRUCKMAN PMT will continue to support holistically.  I personally spent a total of 55 minutes in the care of the patient today including preparing to see the patient, getting/reviewing separately obtained history, performing a medically appropriate exam/evaluation, counseling and educating, placing orders, referring and communicating with other health care professionals, documenting clinical information in the EHR, and coordinating care.  Signed by: Fairy FORBES Shan DEVONNA Palliative Medicine Team  Team Phone # [phone] (Nights/Weekends)  11/28/2024, 11:02 AM   "

## 2024-11-28 NOTE — TOC Progression Note (Addendum)
 Transition of Care (TOC) - Progression Note   Received consult for home with hospice. Spoke to patient and wife [name] at bedside and confirmed plan.   Offered choice they would like [facility]. Referral sent to Amy with [facility] . Amy will review chart and be in touch with patient and wife. [facility] will order any needed DME .   Patient and wife aware .   Discussed transportation home. Patient and wife unsure at this time but may need ambulance transport .   NCM will continue to follow   [facility] with [facility] aware , plan has changed from home health to hospice . Requested a signed DNR from palliative and /or attending  Patient Details  Name: [name] MRN: [id number] Date of Birth: Oct 26, 1941  Transition of Care [facility]) CM/SW Contact  Adonia Porada, Powell Jansky, RN Phone Number: 11/28/2024, 1:30 PM  Clinical Narrative:       Expected Discharge Plan: Home w Home Health Services Barriers to Discharge: Continued Medical Work up               Expected Discharge Plan and Services   Discharge Planning Services: CM Consult Post Acute Care Choice: Home Health Living arrangements for the past 2 months: Apartment                 DME Arranged: N/A DME Agency: NA       HH Arranged: PT HH Agency: [facility] (awaiting determination) Date HH Agency Contacted: 11/23/24 Time HH Agency Contacted: 1522 Representative spoke with at [facility] Agency: Darleene awaiting determination   Social Drivers of Health (SDOH) Interventions SDOH Screenings   Food Insecurity: No Food Insecurity (11/23/2024)  Housing: Low Risk (11/23/2024)  Transportation Needs: No Transportation Needs (11/23/2024)  Utilities: Not At Risk (11/23/2024)  Alcohol Screen: Low Risk (08/04/2023)  Depression (PHQ2-9): Low Risk (09/22/2024)  Social Connections: Socially Integrated (11/23/2024)  Tobacco Use: Medium Risk (11/23/2024)    Readmission Risk Interventions    10/09/2024    9:20 AM  Readmission Risk  Prevention Plan  Transportation Screening Complete  PCP or Specialist Appt within 5-7 Days Complete  Home Care Screening Complete  Medication Review (RN CM) Complete

## 2024-11-28 NOTE — Progress Notes (Signed)
 Mobility Specialist Progress Note:    11/28/24 1032  Mobility  Activity Ambulated with assistance (To BR)  Level of Assistance Contact guard assist, steadying assist  Assistive Device Front wheel walker  Distance Ambulated (ft) 20 ft  Activity Response Tolerated well  Mobility Referral Yes  Mobility visit 1 Mobility  Mobility Specialist Start Time (ACUTE ONLY) 1022  Mobility Specialist Stop Time (ACUTE ONLY) 1032  Mobility Specialist Time Calculation (min) (ACUTE ONLY) 10 min   Received pt in bed and agreeable to mobility. Pt required MinG for safety. Upon taking a couple steps, pt requested to use BR d/t BM incontinence. Left pt in BR and instructed to use call light once finished. NT aware.  Lavanda Pollack Mobility Specialist  Please contact via [employer] or  Rehab Office [phone]

## 2024-11-28 NOTE — Progress Notes (Signed)
 [facility] [phone]  [facility] Liaison Note  Received a request from Care Manager for hospice services at home after discharge. Spoke with family to initiate education related to hospice philosophy, services and team approach to care. [name] verbalized understanding of information given.  Per discussion, the plan is for discharge to via [facility] when medically stable.  DME needs discussed and requests hospital bed, over bed table, bedside commode and walker.  Please send signed and completed DNR home with the patient/family.  Please provide prescriptions at discharge as needed to ensure ongoing symptom management.  [facility] information and contact numbers given to [name].  Above information shared with Care Manager.  Please call with any questions or concerns.  Thank you for the opportunity to participate in this patient's care.  [name], BSN, RN [employer] [phone]

## 2024-11-28 NOTE — Progress Notes (Signed)
 " PROGRESS NOTE    [name]  FMW:[id number] DOB: May 26, 1941 DOA: 11/22/2024 PCP: [name], MD  [name] with chronic systolic CHF, VT, AICD, chronic anemia, stage III/IV adeno CA left lung recently undergoing chemotherapy presented to the ED with chest pain and worsening shortness of breath.  In the ED he was tachycardic, tachypneic, labs noted creatinine of 0.8, troponin 23, proBNP 3724, D-dimer 2.0, hemoglobin 8.5, CTA chest noted no PE, moderate right and partially loculated left effusion with compressive atelectasis.  Underwent right-sided thoracentesis by EDP, 800 mL fluid drained. - Subsequently had left thoracentesis per IR while in the ER - 1/3 x-ray for chest pain noted small left pneumothorax -1/5: d/w Pulm again, conservative mgt, poor candidate for invasive mgt w/ Chest tube etc -1/7: d/w Oncology, poor prognosis, palliative care consulted  Subjective: - Chest pain is a little better, breathing a little better, had a BM yesterday, didn't eat breakfast  Assessment and Plan:  Acute on chronic combined systolic/diastolic CHF - Last echo 11/25 noted EF 45-50%, grade 1 DD, normal RV - Suspect his pleural effusions are multifactorial likely related to both malignancy as well as CHF - BP soft, switch to oral torsemide , continue low-dose Toprol  -- Increase activity, PT OT eval - Palliative consulted for GoC meeting today, I discussed Home Hospice with Patient and spouse today  Bilateral pleural effusions -sp thoracentesis on both sides 1/2 morning in the ER - Unfortunately pleural fluid protein and LDH were not checked, cultures negative so far, pleural fluid left lung is positive for malignancy, risk of re-accumulation -Right pleural cytology is negative - Suspect multifactorial etiology-both CHF as well as malignancy contributing -palliative consulted,   Left PTX - Postthoracentesis 1/2 - Appreciate pulmonary input, suspected to be lung entrapment on the left the setting of  cancer, less likely malignant BP fistula - Repeat x-ray with small basilar PTX, largely asymptomatic will discuss with pulmonary today, felt to be a poor candidate for invasive management, chest tube etc., recommended conservative management for now  Adeno CA left lung, stage IV -sp XRT 9/24-11/24 - Was recently undergoing chemotherapy, currently on hold in the setting of recent bacteremia - Followed by Dr. Gatha - Called and discussed with Dr. Gatha, felt to be a poor candidate for ongoing chemo, aggressive treatment, he agreed with palliative care/hospice  Ethics: Chronically ill and frail with CHF, metastatic lung cancer, malignant pleural effusion, I worry he is at risk of ongoing decline, discussed poor prognosis with patient and spouse - Palliative consulted for goals of care, meeting today  Persistent chest pain - Secondary to all of the above, CTA negative for PE, no ACS,  has right sided chest wall tenderness at the site of the pain, given Toradol  earlier, continue lidocaine  patch  Acute on chronic anemia History of iron deficiency anemia - Hemoglobin dropped to 7.2 this admission, transfused 1 unit of PRBC, now improved and stable  History of paroxysmal ventricular tachycardia - Monitor electrolytes, continue Toprol   Recent bacteremia, staph lugdunensis - Completed 6 weeks of cefazolin , TEE was negative    DVT prophylaxis:  Lovenox  Code Status: DNR Family Communication:  wife Cassandra at bedside Disposition Plan: Home w/ ? Hospice  Procedures: Right and left thoracentesis 1/2   Antimicrobials:    Objective: Vitals:   11/27/24 2219 11/28/24 0427 11/28/24 0500 11/28/24 0900  BP: 107/72 93/60  (!) 103/59  Pulse: 99 99  100  Resp: 18 16    Temp: 98.7 F (37.1 C) 98.4 F (  36.9 C)  98 F (36.7 C)  TempSrc:    Oral  SpO2: 100% 98%  98%  Weight:   58.8 kg   Height:        Intake/Output Summary (Last 24 hours) at 11/28/2024 1156 Last data filed at 11/28/2024  0500 Gross per 24 hour  Intake --  Output 500 ml  Net -500 ml   Filed Weights   11/24/24 0500 11/25/24 0500 11/28/24 0500  Weight: 61.4 kg 61.4 kg 58.8 kg    Examination:  General exam: Elderly chronically ill, AAO x 2, no distress HEENT: no JVD Respiratory system: Decreased breath sounds at the bases Cardiovascular system: S1 & S2 heard, RRR.  Abd: nondistended, soft and nontender.Normal bowel sounds heard. Central nervous system: Alert and oriented. No focal neurological deficits. Extremities: trace edema Skin: No rashes Psychiatry:  Mood & affect appropriate.     Data Reviewed:   CBC: Recent Labs  Lab 11/23/24 0433 11/24/24 0823 11/25/24 0722 11/26/24 0422 11/27/24 0506  WBC 4.9 4.1 3.6* 6.7 7.5  NEUTROABS 4.0  --   --   --   --   HGB 7.2* 8.4* 8.5* 8.4* 8.4*  HCT 23.4* 25.4* 26.3* 26.0* 25.9*  MCV 102.2* 95.8 97.8 99.2 97.0  PLT 167 157 143* 160 188   Basic Metabolic Panel: Recent Labs  Lab 11/22/24 1311 11/23/24 0433 11/24/24 0823 11/25/24 0722 11/26/24 0422 11/27/24 0506  NA  --  136 139 139 137 141  K  --  4.4 3.6 3.7 3.9 3.6  CL  --  101 101 101 100 101  CO2  --  23 28 28 28 27   GLUCOSE  --  132* 105* 113* 130* 128*  BUN  --  13 16 23  29* 29*  CREATININE  --  0.89 0.97 1.23 1.23 1.22  CALCIUM   --  9.2 9.4 8.9 9.2 9.4  MG 1.8 1.7  --   --   --   --   PHOS  --  4.1  --   --   --   --    GFR: Estimated Creatinine Clearance: 36.8 mL/min (by C-G formula based on SCr of 1.22 mg/dL). Liver Function Tests: Recent Labs  Lab 11/22/24 1111 11/23/24 0433  AST 24 26  ALT <5 <5  ALKPHOS 50 43  BILITOT 0.7 0.7  PROT 7.9 7.4  ALBUMIN  3.4* 3.1*   No results for input(s): LIPASE, AMYLASE in the last 168 hours. No results for input(s): AMMONIA in the last 168 hours. Coagulation Profile: Recent Labs  Lab 11/23/24 2022  INR 1.3*   Cardiac Enzymes: No results for input(s): CKTOTAL, CKMB, CKMBINDEX, TROPONINI in the last 168  hours. BNP (last 3 results) Recent Labs    11/22/24 1311 11/23/24 0433  PROBNP 3,724.0* 3,736.0*   HbA1C: No results for input(s): HGBA1C in the last 72 hours. CBG: No results for input(s): GLUCAP in the last 168 hours. Lipid Profile: No results for input(s): CHOL, HDL, LDLCALC, TRIG, CHOLHDL, LDLDIRECT in the last 72 hours. Thyroid  Function Tests: No results for input(s): TSH, T4TOTAL, FREET4, T3FREE, THYROIDAB in the last 72 hours. Anemia Panel: No results for input(s): VITAMINB12, FOLATE, FERRITIN, TIBC, IRON, RETICCTPCT in the last 72 hours.  Urine analysis:    Component Value Date/Time   COLORURINE YELLOW 07/12/2024 1325   APPEARANCEUR HAZY (A) 07/12/2024 1325   LABSPEC 1.012 07/12/2024 1325   PHURINE 6.0 07/12/2024 1325   GLUCOSEU NEGATIVE 07/12/2024 1325   HGBUR NEGATIVE 07/12/2024 1325  BILIRUBINUR NEGATIVE 07/12/2024 1325   KETONESUR NEGATIVE 07/12/2024 1325   PROTEINUR NEGATIVE 07/12/2024 1325   UROBILINOGEN 0.2 11/22/2011 0247   NITRITE POSITIVE (A) 07/12/2024 1325   LEUKOCYTESUR TRACE (A) 07/12/2024 1325   Sepsis Labs: @LABRCNTIP (procalcitonin:4,lacticidven:4)  ) Recent Results (from the past 240 hours)  Body fluid culture w Gram Stain     Status: None   Collection Time: 11/22/24  8:32 PM   Specimen: Pleural Fluid  Result Value Ref Range Status   Specimen Description FLUID  Final   Special Requests NONE  Final   Gram Stain   Final    FEW WBC PRESENT, PREDOMINANTLY PMN NO ORGANISMS SEEN    Culture   Final    NO GROWTH 3 DAYS Performed at [facility], [address] [place], [place] [address]    Report Status 11/26/2024 FINAL  Final  Gram stain     Status: None   Collection Time: 11/23/24  9:04 AM   Specimen: Pleura  Result Value Ref Range Status   Specimen Description PLEURAL  Final   Special Requests NONE  Final   Gram Stain   Final    WBC PRESENT, PREDOMINANTLY MONONUCLEAR NO ORGANISMS  SEEN CYTOSPIN SMEAR Performed at [facility], [address] [place], [place] [address]    Report Status 11/23/2024 FINAL  Final     Radiology Studies: No results found.    Scheduled Meds:  cycloSPORINE   1 drop Both Eyes BID   dorzolamide -timolol   1 drop Both Eyes BID   enoxaparin  (LOVENOX ) injection  40 mg Subcutaneous Q24H   fluticasone  furoate-vilanterol  1 puff Inhalation q morning   ketorolac   7.5 mg Intravenous Once   lactulose   20 g Oral BID   lidocaine   1 patch Transdermal Q24H   lidocaine   10 mL     metoprolol  succinate  12.5 mg Oral Daily   polyethylene glycol  17 g Oral Daily   senna-docusate  1 tablet Oral BID   torsemide   20 mg Oral Daily   Continuous Infusions:  promethazine  (PHENERGAN ) injection (IM or IVPB)       LOS: 5 days    Time spent:    Sigurd Pac, MD [facility]   11/28/2024, 11:56 AM    "

## 2024-11-28 NOTE — Progress Notes (Signed)
 Physical Therapy Treatment Patient Details Name: [name] MRN: [id number] DOB: [birth year] Today's Date: 11/28/2024   History of Present Illness Pt is 84 yo presenting to [facility] on 1/1 due to worsening shortness of breathe. PT was found to be tachycardic/tachypneic in the ED. No PE Moderate R and partially loculated L effusion with compressive atelectasis. S/P R thoracentesis with 800 ML drained. Then L thoracentesis while in ER. Pmh: CHF, VT, AICD, chronic anemia, Stave III/IV adeno CA L lung undergoing chemotherapy.    PT Comments  Pt received in supine, c/o fatigue after visiting with family a lot of the day and Palliative Care team meeting, but agreeable to therapy session in room with encouragement to assess for stair negotiation and make plans for discharge tomorrow. Pt needing up to CGA/light minA for transfers as he is more fatigued and up to CGA for stepping up/down 6.5 platform in room x4 reps. Pt unable to perform simulated full flight of stairs at bedside prior to fatigue and needing to sit, HR tachy to ~110 bpm with exertion, decreased with seated rest break. Discussed plan with pt and he requests PTAR transport home as he would need to tolerate ambulating 20-66ft and full flight of stairs to mobilize from where he parks the car to where he could sit down in apartment, and not currently able to perform this safely. Pt continues to benefit from PT services to progress toward functional mobility goals, per chart review pt likely now to go home with hospice, he would benefit from HHPT vs post-acute therapies if he does not go home with hospice.    If plan is discharge home, recommend the following: A little help with walking and/or transfers;Help with stairs or ramp for entrance;Assistance with cooking/housework;Assist for transportation (PTAR for home due to stairs)   Can travel by private vehicle        Equipment Recommendations  None recommended by PT (Needs PTAR transport home,  unable to perform full flight of stairs)    Recommendations for Other Services       Precautions / Restrictions Precautions Precautions: Fall Recall of Precautions/Restrictions: Intact Precaution/Restrictions Comments: briefs donned as pt was incontinent in AM with MS; RN notified Restrictions Weight Bearing Restrictions Per Provider Order: No     Mobility  Bed Mobility Overal bed mobility: Modified Independent             General bed mobility comments: to R EOB, pt using bed rail    Transfers Overall transfer level: Needs assistance Equipment used: Rolling walker (2 wheels) Transfers: Sit to/from Stand, Bed to chair/wheelchair/BSC Sit to Stand: Contact guard assist, Min assist           General transfer comment: minA for sit>stand to RW, CGA for stand>sit back to EOB.    Ambulation/Gait Ambulation/Gait assistance: Contact guard assist Gait Distance (Feet): 5 Feet Assistive device: Rolling walker (2 wheels) Gait Pattern/deviations: Decreased stride length       General Gait Details: limited distance at bedside to/from step for stair training, then sidesteps to [facility], pt too fatigued to ambulate in room or hallway   Stairs Stairs: Yes Stairs assistance: Contact guard assist Stair Management: Two rails, Step to pattern, Forwards Number of Stairs: 4 General stair comments: single 7 step in room x4 reps, pt has 10 STE home but too fatigued to perform all ten. Pt HR to ~110 bpm with exertion, decreased with seated break. RN/case mgmt notified pt not able to perform full flight of stairs so  he can get PTAR transport for home tomorrow when he discharges. Pt reports he will need to ambulate ~10-55ft x2 and perform 10 steps to reach his apartment once leaving his car, not able to do this today during session.   Wheelchair Mobility     Tilt Bed    Modified Rankin (Stroke Patients Only)       Balance Overall balance assessment: Needs  assistance Sitting-balance support: No upper extremity supported, Feet supported Sitting balance-Leahy Scale: Good     Standing balance support: Bilateral upper extremity supported, During functional activity Standing balance-Leahy Scale: Poor Standing balance comment: Fair with RW, poor unsupported, but also fatigues quickly                            Communication Communication Communication: No apparent difficulties  Cognition Arousal: Alert Behavior During Therapy: WFL for tasks assessed/performed   PT - Cognitive impairments: No apparent impairments                       PT - Cognition Comments: some bowel incontinence today so briefs donned prior to OOB, pt agreeable to this; pt able to indicate fatigue and seems aware of situation and plan for DC tomorrow. Following commands: Intact      Cueing Cueing Techniques: Verbal cues, Gestural cues  Exercises      General Comments General comments (skin integrity, edema, etc.): SpO2 WFL on RA, HR to 110 bpm with exertion      Pertinent Vitals/Pain Pain Assessment Pain Assessment: No/denies pain    Home Living                          Prior Function            PT Goals (current goals can now be found in the care plan section) Acute Rehab PT Goals Patient Stated Goal: improve mobility and return home with hospice services PT Goal Formulation: With patient Time For Goal Achievement: 12/07/24 Progress towards PT goals: Progressing toward goals (slowly)    Frequency    Min 2X/week      PT Plan      Co-evaluation              AM-PAC PT 6 Clicks Mobility   Outcome Measure  Help needed turning from your back to your side while in a flat bed without using bedrails?: None Help needed moving from lying on your back to sitting on the side of a flat bed without using bedrails?: A Little (w/o bed rail) Help needed moving to and from a bed to a chair (including a wheelchair)?: A  Little Help needed standing up from a chair using your arms (e.g., wheelchair or bedside chair)?: A Little Help needed to walk in hospital room?: A Little Help needed climbing 3-5 steps with a railing? : A Little 6 Click Score: 19    End of Session Equipment Utilized During Treatment: Gait belt Activity Tolerance: Treatment limited secondary to medical complications (Comment);Other (comment);Patient limited by fatigue (tachy with minimal exertion per tele monitor) Patient left: with call bell/phone within reach;in bed;with bed alarm set Nurse Communication: Mobility status;Other (comment) (pt has briefs on, may need skin checked later; not able to perform stairs, will need PTAR tomorrow for DC) PT Visit Diagnosis: Other abnormalities of gait and mobility (R26.89);Muscle weakness (generalized) (M62.81)     Time: 8274-8261 PT Time Calculation (min) (  ACUTE ONLY): 13 min  Charges:    $Therapeutic Activity: 8-22 mins PT General Charges $$ ACUTE PT VISIT: 1 Visit                     Ilo Beamon P., PTA Acute Rehabilitation Services Secure Chat Preferred 9a-5:30pm Office: [phone]    Connell HERO [facility] 11/28/2024, 6:04 PM

## 2024-11-28 NOTE — Progress Notes (Signed)
 Called [employer] and spoke with a representative concerning deactivation of defibrillator. Representative obtained and provided all patient's details. Informed me that she would return the call with further instructions.

## 2024-11-28 NOTE — Progress Notes (Signed)
 PT Cancellation Note  Patient Details Name: [name] MRN: [id number] DOB: 1941/02/24   Cancelled Treatment:    Reason Eval/Treat Not Completed: (P) Fatigue/lethargy limiting ability to participate (pt states I don't feel well. He has ~5 visitors in his room. He requests PTA return a little bit later in the day.) Will continue efforts per PT plan of care as schedule permits.   Connell HERO Dulcemaria Bula 11/28/2024, 11:57 AM

## 2024-11-29 ENCOUNTER — Inpatient Hospital Stay: Admitting: Internal Medicine

## 2024-11-29 ENCOUNTER — Inpatient Hospital Stay

## 2024-11-29 ENCOUNTER — Encounter: Payer: Self-pay | Admitting: Internal Medicine

## 2024-11-29 ENCOUNTER — Telehealth: Payer: Self-pay

## 2024-11-29 LAB — MISC LABCORP TEST (SEND OUT)
Labcorp test code: 9985
Labcorp test code: 9985

## 2024-11-29 LAB — CULTURE, BODY FLUID W GRAM STAIN -BOTTLE: Culture: NO GROWTH

## 2024-11-29 LAB — GLUCOSE, CAPILLARY: Glucose-Capillary: 95 mg/dL (ref 70–99)

## 2024-11-29 LAB — MAGNESIUM: Magnesium: 1.8 mg/dL (ref 1.7–2.4)

## 2024-11-29 MED ORDER — TORSEMIDE 20 MG PO TABS: 20.0000 mg | ORAL_TABLET | Freq: Every day | ORAL | 1 refills | Status: AC

## 2024-11-29 MED ORDER — POLYETHYLENE GLYCOL 3350 17 G PO PACK: 17.0000 g | PACK | Freq: Every day | ORAL | 0 refills | Status: AC

## 2024-11-29 MED ORDER — SENNOSIDES-DOCUSATE SODIUM 8.6-50 MG PO TABS: 1.0000 | ORAL_TABLET | Freq: Two times a day (BID) | ORAL | 0 refills | Status: AC

## 2024-11-29 NOTE — Discharge Summary (Signed)
 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 [facility]:  [name] with chronic systolic CHF, VT, AICD, chronic anemia, stage III/IV adeno CA left lung recently undergoing chemotherapy presented to the ED with chest pain and worsening shortness of breath.  In the ED he was tachycardic, tachypneic, labs noted creatinine of 0.8, troponin 23, proBNP 3724, D-dimer 2.0, hemoglobin 8.5, CTA chest noted no PE, moderate right and partially loculated left effusion with compressive atelectasis.  Underwent right-sided thoracentesis by EDP, 800 mL fluid drained. - Subsequently had left thoracentesis per IR while in the ER - 1/3 x-ray for chest pain noted small left pneumothorax -1/5: d/w Pulm again, conservative mgt, poor candidate for invasive mgt w/ Chest tube etc -1/7: d/w Oncology, poor prognosis, palliative care consulted 1/8: Patient have agreed to go home with hospice    Assessment and Plan:   Acute on chronic combined systolic/diastolic CHF - Last echo 11/25 noted EF 45-50%, grade 1 DD, normal RV - Suspect his pleural effusions are multifactorial likely related to both malignancy as well as CHF - Discharge home today with hospice - Palliative and hospice discussed goals of care with patient and they have agreed for discharge  home with hospice   Bilateral pleural effusions -sp thoracentesis on both sides 1/2 morning in the ER - Unfortunately pleural fluid protein and LDH were not checked, cultures negative so far, pleural fluid left lung is positive for malignancy, risk of re-accumulation -Right pleural cytology is negative Patient has agreed for discharge home with hospice   Left PTX - Postthoracentesis 1/2 - Appreciate pulmonary input, suspected to be lung entrapment on the left the setting of cancer, less likely malignant BP fistula - Repeat x-ray with small basilar PTX, largely asymptomatic will discuss with pulmonary today, felt to be a poor candidate for invasive management, chest tube etc., recommended conservative management for now   Adeno CA left lung, stage IV -sp XRT 9/24-11/24 - Was recently undergoing chemotherapy, currently on hold in the setting of recent bacteremia - Followed by Dr. Gatha - Called and discussed with Dr. Gatha, felt to be a poor candidate for ongoing chemo, aggressive treatment, he agreed with palliative care/hospice    Acute on chronic anemia History of iron deficiency anemia - Hemoglobin dropped to 7.2 this admission, transfused 1 unit of PRBC, now improved and stable   History of paroxysmal ventricular tachycardia - Monitor electrolytes, continue Toprol    Recent bacteremia, staph lugdunensis - Completed 6 weeks of cefazolin , TEE was negative      Consultants: Hospice and palliative Procedures performed: None Disposition: Home hospice Diet recommendation:  Cardiac diet DISCHARGE MEDICATION: Allergies as of 11/29/2024       Reactions   Paclitaxel  Other (See Comments), Hypertension   Pt reported chest pain, back pain, and SOB during first infusion (see  notes from 9/23). Infusion discontinued.    Allegra [fexofenadine] Other (See Comments)   Makes him dizzy and feels like he will fall.   Sonafine [wound Dressings] Itching   Itching and burning to skin   08/25/23        Medication List     STOP taking these medications    apixaban  2.5 MG Tabs tablet Commonly known as: Eliquis    Entresto  24-26 MG Generic drug: sacubitril -valsartan    furosemide  20 MG tablet Commonly known as: LASIX    isosorbide  mononitrate 30 MG 24 hr tablet Commonly known as: IMDUR    spironolactone  25 MG tablet Commonly known as: ALDACTONE        TAKE these medications    Breo Ellipta  100-25 MCG/INH Aepb Generic drug: fluticasone  furoate-vilanterol Inhale 1 puff into the lungs every morning.   brimonidine  0.2 % ophthalmic solution Commonly known as: ALPHAGAN  Place 1 drop into both eyes 2 (two) times daily.   cycloSPORINE  0.05 % ophthalmic emulsion Commonly known as: RESTASIS  Place 1 drop into both eyes 2 (two) times daily.   dorzolamide -timolol  2-0.5 % ophthalmic solution Commonly known as: COSOPT  Place 1 drop into both eyes 2 (two) times daily.   folic acid  1 MG tablet Commonly known as: FOLVITE  Take 1 tablet (1 mg total) by mouth daily. Start 7 days before pemetrexed  chemotherapy. Continue until 21 days after pemetrexed  completed.   FreeStyle Libre 3 Plus Sensor Misc SMARTSIG:Topical Every 3 Months   Glucagon  Emergency 1 MG Kit Inject 1 mg into the skin as needed for up to 2 doses (Severe low blood sugar).   hydrocortisone  1 % lotion Apply 1 Application topically 2 (two) times daily. What changed:  when to take this reasons to take this   insulin  isophane & regular human KwikPen (70-30) 100 UNIT/ML KwikPen Commonly known as: HUMULIN 70/30 MIX Inject 25 Units into the skin in the morning and at bedtime.   lidocaine  5 % Commonly known as: LIDODERM  Place 1 patch onto the skin daily. Remove & Discard patch within 12 hours or as directed by MD   metoprolol  succinate 25 MG 24 hr tablet Commonly known as: TOPROL -XL Take 1 tablet (25 mg total) by mouth daily.   omeprazole  20 MG capsule Commonly known as: PRILOSEC TAKE 1 CAPSULE(20  MG) BY MOUTH DAILY   ondansetron  8 MG tablet Commonly known as: Zofran  Take 1 tablet (8 mg total) by mouth every 8 (eight) hours as needed for nausea or vomiting. Start on the third day after carboplatin .   polyethylene glycol 17 g packet Commonly known as: MIRALAX  / GLYCOLAX  Take 17 g by mouth daily. Start taking on: November 30, 2024   Rhopressa 0.02 % Soln Generic drug: Netarsudil  Dimesylate Apply 1 drop to eye every evening.   Rocklatan  0.02-0.005 % Soln Generic drug: Netarsudil -Latanoprost  Place 1 drop into both eyes at bedtime.   senna-docusate 8.6-50 MG tablet Commonly known as: Senokot-S Take 1 tablet by mouth 2 (two) times daily.   torsemide  20 MG tablet Commonly known as: DEMADEX  Take 1 tablet (20 mg total) by mouth daily. Start taking on: November 30, 2024   Vyzulta  0.024 % Soln Generic drug: Latanoprostene Bunod  Apply 1 drop to eye at bedtime.        Contact information for follow-up providers     [facility] Follow up.   Specialty: Hospice and Palliative Medicine Contact information: [address] [place]  [address] [phone]             Contact information  for after-discharge care     Home Medical Care     [facility]) .   Service: Home Health Services Contact information: [address] [place]  [address] [phone]                    Discharge Exam: Fredricka Weights   11/25/24 0500 11/28/24 0500 11/29/24 0500  Weight: 61.4 kg 58.8 kg 58.7 kg   General exam: Elderly chronically ill, AAO x 2, no distress HEENT: no JVD Respiratory system: Decreased breath sounds at the bases Cardiovascular system: S1 & S2 heard, RRR.  Abd: nondistended, soft and nontender.Normal bowel sounds heard. Central nervous system: Alert and oriented. No focal neurological deficits. Extremities: trace edema Skin: No rashes Psychiatry:  Mood & affect appropriate.    Condition at discharge: good  The results of significant diagnostics from this hospitalization (including imaging, microbiology, ancillary and laboratory) are listed below for reference.   Imaging Studies: DG CHEST PORT 1 VIEW Result Date: 11/26/2024 CLINICAL DATA:  Follow-up persistent small left basilar pneumothorax. EXAM: PORTABLE CHEST 1 VIEW COMPARISON:  11/25/2024 FINDINGS: The previously demonstrated 1.7 cm thick left basilar pneumothorax currently measures 1.4 cm thick. Mild increase in patchy and linear density at the left lung base and increased patchy density at the right lung base. Stable small right pleural effusion. Normal-sized heart. Stable left subclavian pacemaker leads. Thoracic spine degenerative changes and thoracolumbar spine fixation hardware. IMPRESSION: 1. Mild decrease in size of the previously demonstrated 1.7 cm thick left basilar pneumothorax. 2. Mild increase in bibasilar atelectasis and/or pneumonia. 3. Stable small right pleural effusion. Electronically Signed   By: Elspeth Bathe M.D.   On: 11/26/2024 17:25   DG CHEST PORT 1 VIEW Result Date: 11/25/2024 EXAM: 1 VIEW(S) XRAY OF THE CHEST 11/25/2024 07:17:00 AM COMPARISON: 11/24/2024 CLINICAL HISTORY: Pneumothorax FINDINGS: LINES, TUBES AND DEVICES: Left chest AICD in place. LUNGS AND PLEURA: Stable small right pleural effusion. Persistent small left basilar pneumothorax. Decreased interstitial opacities. Persistent masslike airspace opacities in left lung base. HEART AND MEDIASTINUM: No acute abnormality of the cardiac and mediastinal silhouettes. BONES AND SOFT TISSUES: Thoracolumbar surgical hardware noted. IMPRESSION: 1. Persistent small left basilar pneumothorax. This measures approximately 1.7 cm in thickness over the left costophrenic angle versus 1.4 cm previously. 2. Persistent masslike airspace opacities in the left lung base. 3. Stable small right pleural effusion. Electronically signed by: Waddell Calk MD  11/25/2024 08:55 AM EST RP Workstation: [id number]   DG CHEST PORT 1 VIEW Result Date: 11/24/2024 EXAM: 1 VIEW(S) XRAY OF THE CHEST 11/24/2024 08:03:48 AM COMPARISON: 11/23/2024 CLINICAL HISTORY: Chest pain FINDINGS: LINES, TUBES AND DEVICES: Left chest AICD. LUNGS AND PLEURA: Small right pleural effusion. Small pneumothorax at left lung base. Increased basilar airspace opacities on the right. Similar masslike airspace opacities in the left lung base. Unchanged diffuse interstitial opacities. HEART AND MEDIASTINUM: Aortic atherosclerosis. No acute abnormality of the cardiac and mediastinal silhouettes. No evidence of midline shift. BONES AND SOFT TISSUES: Partially visualized lumbar spinal fusion hardware in place. No acute osseous abnormality. IMPRESSION: 1. Small pneumothorax at the left lung base. 2. Increased small right pleural effusion with increased basilar opacities which may be due to atelectasis or pneumonia. 3. Similar masslike left basilar airspace opacities. Results will be called by the radiology assistant and documented within [facility]. Electronically signed by: Norman Gatlin MD 11/24/2024 08:11 AM EST RP Workstation: [id number]   DG Chest 1 View Result Date: 11/23/2024 CLINICAL  DATA:  Status post left thoracentesis. EXAM: CHEST  1 VIEW COMPARISON:  11/22/2024 and chest CTA 11/22/2024 FINDINGS: Aeration at the left lung base has improved. Persistent mass-like opacity in the lingular region is similar to the recent chest CT. Probable pleural thickening along the left lung apex. Residual densities at the right lung base compatible with pleural fluid and volume loss. Negative for a pneumothorax. Stable position of the left chest ICD. Heart and mediastinum are stable. Surgical hardware in the thoracolumbar spine. IMPRESSION: 1. Negative for a pneumothorax following left thoracentesis. 2. Improved aeration at the left lung base. 3. Residual densities at the right lung base compatible with pleural fluid  and volume loss. 4. Persistent opacity in the lingular region. Electronically Signed   By: Juliene Balder M.D.   On: 11/23/2024 09:50   IR THORACENTESIS ASP PLEURAL SPACE W/IMG GUIDE Result Date: 11/23/2024 INDICATION: 84 year old male admitted for shortness of breath with history of acute on chronic combined systolic and diastolic CHF, with bilateral pleural effusions. Thoracentesis on right side performed yesterday. IR is requested for left-sided diagnostic and therapeutic thoracentesis. EXAM: ULTRASOUND GUIDED LEFT THE DIAGNOSTIC AND THERAPEUTIC THORACENTESIS MEDICATIONS: 5 mL of 1% lidocaine . COMPLICATIONS: None immediate. PROCEDURE: An ultrasound guided thoracentesis was thoroughly discussed with the patient and questions answered. The benefits, risks, alternatives and complications were also discussed. The patient understands and wishes to proceed with the procedure. Written consent was obtained. Ultrasound was performed to localize and mark an adequate pocket of fluid in the left chest. The area was then prepped and draped in the normal sterile fashion. 1% Lidocaine  was used for local anesthesia. Under ultrasound guidance a 6 Fr Safe-T-Centesis catheter was introduced. Thoracentesis was performed. The catheter was removed and a dressing applied. FINDINGS: A total of approximately 300 mL of hazy, straw-colored pleural fluid was removed. Samples were sent to the laboratory as requested by the clinical team. IMPRESSION: Successful ultrasound guided left thoracentesis yielding 300 ml of pleural fluid. Procedure performed Carlin Griffon, PA-C, under the direct supervision of Dr. Juliene Balder, MD Electronically Signed   By: Juliene Balder M.D.   On: 11/23/2024 09:47   DG Chest Port 1 View Result Date: 11/22/2024 EXAM: 1 VIEW(S) XRAY OF THE CHEST 11/22/2024 08:43:04 PM COMPARISON: 11/22/2024 CLINICAL HISTORY: s/p thoracentesis FINDINGS: LINES, TUBES AND DEVICES: Left chest AICD/pacemaker noted. LUNGS AND PLEURA: Stable  interstitial prominence. Stable bibasilar opacities. Stable bilateral pleural effusions. No pneumothorax. HEART AND MEDIASTINUM: Left chest AICD/pacemaker noted. Stable cardiomegaly. Aortic atherosclerosis. BONES AND SOFT TISSUES: Partially visualized spinal fixation hardware in lower thoracic spine. No acute osseous abnormality. IMPRESSION: 1. Stable bilateral pleural effusions. 2. Stable central pulmonary vascular congestion and bibasilar airspace disease. 3. Stable cardiomegaly with aortic atherosclerosis. 4. Left chest AICD/pacemaker in place. Electronically signed by: Greig Pique MD 11/22/2024 08:54 PM EST RP Workstation: [id number]   CT Angio Chest PE W and/or Wo Contrast Result Date: 11/22/2024 EXAM: CTA CHEST 11/22/2024 05:06:29 PM TECHNIQUE: CTA of the chest was performed without and with the administration of 75 mL of iohexol  (OMNIPAQUE ) 350 MG/ML injection. Multiplanar reformatted images are provided for review. MIP images are provided for review. Automated exposure control, iterative reconstruction, and/or weight based adjustment of the mA/kV was utilized to reduce the radiation dose to as low as reasonably achievable. COMPARISON: CT chest 08/29/2024. CLINICAL HISTORY: elevated dimer, chest pain FINDINGS: PULMONARY ARTERIES: Pulmonary arteries are adequately opacified for evaluation. No acute pulmonary embolus. Main pulmonary artery is normal in caliber. MEDIASTINUM: The heart is enlarged,  unchanged. Coronary and aortic atherosclerotic calcifications are noted. Aorta is ectatic without focal dilatation. The pericardium demonstrates no acute abnormality. LYMPH NODES: Mildly enlarged low right paratracheal lymph node measures 10 mm, unchanged. No other new and large lymph nodes are seen. LUNGS AND PLEURA: There is a new moderate sized right pleural effusion. There is a new small left pleural effusion which is partially loculated. No pneumothorax. There are secretions in the trachea. Mild emphysema  present. There is compressive atelectasis of the bilateral lower lobes. Masslike density in the lingula measures 3.9 x 4.1 cm and appears unchanged from prior. There is some new patchy airspace disease. Patchy airspace opacities in the left upper lobe and superior segment of the left lower lobe is not significantly changed. UPPER ABDOMEN: There is a 6 mm calculus in the right kidney. Limited images of the upper abdomen are otherwise unremarkable. SOFT TISSUES AND BONES: Left sided pacemaker is present. Lower thoracic spinal fusion hardware is present. No acute bone or soft tissue abnormality. IMPRESSION: 1. No evidence of pulmonary embolism. 2. New moderate right pleural effusion and small partially loculated left pleural effusion, with compressive atelectasis in the bilateral lower lobes. 3. Unchanged masslike density in the lingula. 4. Mildly enlarged low right paratracheal lymph node, unchanged. 5. Unchanged patchy airspace opacities in the left upper lobe and superior segment of the left lower lobe. Electronically signed by: Greig Pique MD 11/22/2024 05:33 PM EST RP Workstation: [id number]   DG Chest 1 View Result Date: 11/22/2024 EXAM: 1 VIEW(S) XRAY OF THE CHEST 11/22/2024 12:20:00 PM COMPARISON: 10/06/2024 CLINICAL HISTORY: Pain FINDINGS: LINES, TUBES AND DEVICES: Left chest wall cardiac defibrillator in place. Left shoulder rotator cuff anchor suture noted. LUNGS AND PLEURA: Bilateral lower lung zone airspace opacities. Increased interstitial markings. Moderate bilateral pleural effusions. No pneumothorax. HEART AND MEDIASTINUM: Atherosclerotic plaque. No acute abnormality of the cardiac and mediastinal silhouettes. BONES AND SOFT TISSUES: Partially visualized thoracolumbar spinal fixation hardware and lumbar surgical hardware. No acute osseous abnormality. IMPRESSION: 1. Bilateral lower lung zone airspace opacities and increased interstitial markings chronically reflecting edema and atelectasis. Lower  lobe infection is not excluded. 2. Moderate bilateral pleural effusions. Electronically signed by: Lonni Necessary MD 11/22/2024 01:09 PM EST RP Workstation: [id number]    Microbiology: Results for orders placed or performed during the hospital encounter of 11/22/24  Body fluid culture w Gram Stain     Status: None   Collection Time: 11/22/24  8:32 PM   Specimen: Pleural Fluid  Result Value Ref Range Status   Specimen Description FLUID  Final   Special Requests NONE  Final   Gram Stain   Final    FEW WBC PRESENT, PREDOMINANTLY PMN NO ORGANISMS SEEN    Culture   Final    NO GROWTH 3 DAYS Performed at [facility], [address] [place], [place] [address]    Report Status 11/26/2024 FINAL  Final  Gram stain     Status: None   Collection Time: 11/23/24  9:04 AM   Specimen: Pleura  Result Value Ref Range Status   Specimen Description PLEURAL  Final   Special Requests NONE  Final   Gram Stain   Final    WBC PRESENT, PREDOMINANTLY MONONUCLEAR NO ORGANISMS SEEN CYTOSPIN SMEAR Performed at [facility], [address], [place], [place] [address]    Report Status 11/23/2024 FINAL  Final  Culture, body fluid w Gram Stain-bottle     Status: None   Collection Time: 11/23/24  9:04 AM   Specimen: Pleura  Result Value Ref Range Status   Specimen Description PLEURAL  Final   Special Requests NONE  Final   Culture   Final    NO GROWTH 5 DAYS Performed at [facility], [address], [place], [place] [address]    Report Status 11/29/2024 FINAL  Final   *Note: Due to a large number of results and/or encounters for the requested time period, some results have not been displayed. A complete set of results can be found in Results Review.    Labs: CBC: Recent Labs  Lab 11/23/24 0433 11/24/24 0823 11/25/24 0722 11/26/24 0422 11/27/24 0506  WBC 4.9 4.1 3.6* 6.7 7.5  NEUTROABS 4.0  --   --   --   --   HGB 7.2* 8.4* 8.5* 8.4* 8.4*  HCT 23.4* 25.4* 26.3* 26.0*  25.9*  MCV 102.2* 95.8 97.8 99.2 97.0  PLT 167 157 143* 160 188   Basic Metabolic Panel: Recent Labs  Lab 11/23/24 0433 11/24/24 0823 11/25/24 0722 11/26/24 0422 11/27/24 0506 11/29/24 0511  NA 136 139 139 137 141  --   K 4.4 3.6 3.7 3.9 3.6  --   CL 101 101 101 100 101  --   CO2 23 28 28 28 27   --   GLUCOSE 132* 105* 113* 130* 128*  --   BUN 13 16 23  29* 29*  --   CREATININE 0.89 0.97 1.23 1.23 1.22  --   CALCIUM  9.2 9.4 8.9 9.2 9.4  --   MG 1.7  --   --   --   --  1.8  PHOS 4.1  --   --   --   --   --    Liver Function Tests: Recent Labs  Lab 11/23/24 0433  AST 26  ALT <5  ALKPHOS 43  BILITOT 0.7  PROT 7.4  ALBUMIN  3.1*   CBG: No results for input(s): GLUCAP in the last 168 hours.  Discharge time spent:  38 minutes.  Signed: Drue ONEIDA Potter, MD [facility] 11/29/2024 "

## 2024-11-29 NOTE — Plan of Care (Signed)
  Problem: Education: Goal: Knowledge of General Education information will improve Description Including pain rating scale, medication(s)/side effects and non-pharmacologic comfort measures Outcome: Progressing   Problem: Activity: Goal: Risk for activity intolerance will decrease Outcome: Progressing   Problem: Nutrition: Goal: Adequate nutrition will be maintained Outcome: Progressing   Problem: Elimination: Goal: Will not experience complications related to bowel motility Outcome: Progressing Goal: Will not experience complications related to urinary retention Outcome: Progressing   Problem: Safety: Goal: Ability to remain free from injury will improve Outcome: Progressing

## 2024-11-29 NOTE — Progress Notes (Signed)
 " Daily Progress Note   Date: 11/29/2024   Patient Name: [name]  DOB: Apr 04, 1941  MRN: [id number]  Age / Sex: 84 y.o., male  Attending Physician: Dorinda Drue DASEN, MD Primary Care Physician: [name], MD Admit Date: 11/22/2024 Length of Stay: 6 days  Reason for Follow-up: Establishing goals of care  Past Medical History:  Diagnosis Date   AICD (automatic cardioverter/defibrillator) present    CAD (coronary artery disease) 80% stenosis diag of the LAD, 30% in OM2 branch of LCX in 2009    a. Nonobstructive CAD by cath 11/2011 with the exception of the pre-existing diagonal branch #2 lesion.   Chronic systolic CHF (congestive heart failure) (HCC)    CKD (chronic kidney disease) stage 3, GFR 30-59 ml/min (HCC)    Colon polyp, hyperplastic    History of kidney stones    History of radiation therapy    Left Lung- 08/17/23-09/29/23- Dr. Lynwood Nasuti   History of stress test 06/01/2012   Normal myocardial perfusion study. compared to the previous study there is no significant change. this is a low risk scan   Hypertension    Legally blind    both eyes   Myocardial infarction [facility]) 11/22/2011   NICM (nonischemic cardiomyopathy) (HCC)    a. Remote hx of dilated NICM with EF ranging 20-45%, including normal EF by echo (55-60%) in 2014.   Peripheral arterial disease    a. 06/2014: ABI right 0.99, left 1.2, LE dopplers revealing an occluded right posterior tibial. As symptoms were not felt r/t claudication, no further w/u at the time.   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 Requiring discontinuation of BB dose.   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    Assessment & Plan:   HPI/Patient Profile:   84 y.o. 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 Per H&P on 11/22/2024 by [facility] DO, patient found to have bilateral pleural effusion on CXR 11/22/2024 and on CT angio chest on 11/22/2024. Thoracentesis performed in the ED by Patt MD on 11/22/2024 with 800 mL of serosanguinous fluid that was positive for malignant pleural effusion on after cytology completed on 11/23/2024.  Per progress note 11/28/2024 by Fairy MD, discussions with patient's primary oncologist Dr. Gatha does not think that the patient is a good candidate for ongoing chemotherapy or further aggressive treatment they believe that palliative/hospice care might be the best course for the patient.   Palliative medicine consulted for goals of care discussion.   SUMMARY OF RECOMMENDATIONS DNR limited Pending home with hospice equipment set up Continue current measures, does not want to initiate comfort measures  Symptom Management:  Per primary team  Code Status: DNR - Limited (DNR/DNI)  Prognosis: < 6 months  Discharge Planning: Home with Hospice  Discussed with: Djan MD 11/29/2024 about discussion with patient to confirm his understanding of hospice and patient remains stable and awaiting DME delivery to home for hospice.   Subjective:   Subjective: Chart Reviewed. Updates received. Patient Assessed. Created space and opportunity for patient  and family to explore thoughts and feelings regarding current medical situation.  Today's Discussion: Met with the patient at bedside without any visitors present.  Discussed hospice with the patient again  who shares that he understands at this time is limited that the plan is to no longer care for his serious illness.  Explained to patient the necessity of deactivating his ICD, he shares that he understands.  Patient continues to look forward to going home with hospice.  Continues to share that he does not want come back to the hospital in the future.  Sure that hospice is the  best way for him to go forward in order to avoid future hospitalization unless he really needs it for symptom management.  Inquired if the patient would like to initiate comfort measures while in the hospital, he shares that he would like to continue current medical interventions at this time before going home with hospice.   Review of Systems  Constitutional:  Positive for fatigue.  All other systems reviewed and are negative.   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  Eyes:     Extraocular Movements: Extraocular movements intact.  Cardiovascular:     Rate and Rhythm: Normal rate.  Pulmonary:     Effort: Pulmonary effort is normal.     Comments: On 2 L/min [place] Musculoskeletal:     Right lower leg: No edema.     Left lower leg: No edema.  Skin:    General: Skin is warm and dry.  Neurological:     General: No focal deficit present.     Mental Status: He is alert.  Psychiatric:        Mood and Affect: Mood normal.    Palliative Assessment/Data: 50%   Existing Vynca/ACP Documentation: None  Thank you for allowing us  to participate in the care of [name] PMT will continue to support holistically.  I personally spent a total of 25 minutes in the care of the patient today including preparing to see the patient, counseling and educating, referring and communicating with other health care professionals, and  documenting clinical information in the EHR.   Fia Hebert E Hira Trent, PA-C  Palliative Medicine Team  Team Phone # [phone] (Nights/Weekends) 11/29/2024 1:05 PM  "

## 2024-11-29 NOTE — Telephone Encounter (Signed)
 Therapy turned off for palliative care

## 2024-11-29 NOTE — TOC Progression Note (Addendum)
 Transition of Care (TOC) - Progression Note   Called [facility]  confirmed DME has been delivered. [facility] ready for NCM to call ambulance. She would like nurse to call her when patient leaves the hospital. Updated nurse and called [facility] patient is on will call . Nurse will call when ready . [facility] paperwork on chart  Patient Details  Name: [name] MRN: [id number] Date of Birth: 01-07-1941  Transition of [facility]) CM/SW Contact  Ever Halberg, Powell Jansky, RN Phone Number: 11/29/2024, 3:41 PM  Clinical Narrative:       Expected Discharge Plan: Home w Home Health Services Barriers to Discharge: Continued Medical Work up               Expected Discharge Plan and Services   Discharge Planning Services: CM Consult Post Acute Care Choice: Home Health Living arrangements for the past 2 months: Apartment                 DME Arranged: N/A DME Agency: NA       HH Arranged: PT HH Agency: [facility] (awaiting determination) Date HH Agency Contacted: 11/23/24 Time HH Agency Contacted: 1522 Representative spoke with at [facility] Agency: [facility] awaiting determination   Social Drivers of Health (SDOH) Interventions SDOH Screenings   Food Insecurity: No Food Insecurity (11/23/2024)  Housing: Low Risk (11/23/2024)  Transportation Needs: No Transportation Needs (11/23/2024)  Utilities: Not At Risk (11/23/2024)  Alcohol Screen: Low Risk (08/04/2023)  Depression (PHQ2-9): Low Risk (09/22/2024)  Social Connections: Socially Integrated (11/23/2024)  Tobacco Use: Medium Risk (11/23/2024)    Readmission Risk Interventions    10/09/2024    9:20 AM  Readmission Risk Prevention Plan  Transportation Screening Complete  PCP or Specialist Appt within 5-7 Days Complete  Home Care Screening Complete  Medication Review (RN CM) Complete

## 2024-11-29 NOTE — Progress Notes (Addendum)
 As per order patient ICD has been deactivated at 6.20am ,papers kept on patient's file, information passed on to the incoming RN.

## 2024-12-04 ENCOUNTER — Inpatient Hospital Stay

## 2024-12-04 ENCOUNTER — Inpatient Hospital Stay: Admitting: Physician Assistant

## 2024-12-07 ENCOUNTER — Encounter: Admitting: Dietician

## 2024-12-10 ENCOUNTER — Encounter

## 2024-12-13 ENCOUNTER — Ambulatory Visit: Payer: Self-pay | Admitting: Internal Medicine

## 2024-12-18 ENCOUNTER — Inpatient Hospital Stay

## 2024-12-18 ENCOUNTER — Inpatient Hospital Stay: Admitting: Internal Medicine

## 2024-12-19 ENCOUNTER — Ambulatory Visit: Admitting: Podiatry

## 2025-01-08 ENCOUNTER — Inpatient Hospital Stay

## 2025-01-08 ENCOUNTER — Inpatient Hospital Stay: Attending: Internal Medicine

## 2025-01-08 ENCOUNTER — Inpatient Hospital Stay: Admitting: Physician Assistant

## 2025-03-11 ENCOUNTER — Encounter

## 2025-06-10 ENCOUNTER — Encounter

## 2025-09-09 ENCOUNTER — Encounter
